# Patient Record
Sex: Male | Born: 1935 | Race: White | Hispanic: No | Marital: Married | Smoking: Never smoker
Health system: Southern US, Community
[De-identification: ages and names within clinical notes are randomized; demographics above are authoritative.]

## PROBLEM LIST (undated history)

## (undated) DIAGNOSIS — D509 Iron deficiency anemia, unspecified: Secondary | ICD-10-CM

## (undated) DIAGNOSIS — I480 Paroxysmal atrial fibrillation: Secondary | ICD-10-CM

## (undated) DIAGNOSIS — I499 Cardiac arrhythmia, unspecified: Secondary | ICD-10-CM

## (undated) DIAGNOSIS — C61 Malignant neoplasm of prostate: Secondary | ICD-10-CM

## (undated) DIAGNOSIS — Z972 Presence of dental prosthetic device (complete) (partial): Secondary | ICD-10-CM

## (undated) DIAGNOSIS — I10 Essential (primary) hypertension: Secondary | ICD-10-CM

## (undated) DIAGNOSIS — R29898 Other symptoms and signs involving the musculoskeletal system: Secondary | ICD-10-CM

## (undated) DIAGNOSIS — Z974 Presence of external hearing-aid: Secondary | ICD-10-CM

## (undated) DIAGNOSIS — I951 Orthostatic hypotension: Secondary | ICD-10-CM

## (undated) DIAGNOSIS — E042 Nontoxic multinodular goiter: Secondary | ICD-10-CM

## (undated) DIAGNOSIS — I77810 Thoracic aortic ectasia: Secondary | ICD-10-CM

## (undated) DIAGNOSIS — I4891 Unspecified atrial fibrillation: Secondary | ICD-10-CM

## (undated) DIAGNOSIS — F32A Depression, unspecified: Secondary | ICD-10-CM

## (undated) DIAGNOSIS — I422 Other hypertrophic cardiomyopathy: Secondary | ICD-10-CM

## (undated) DIAGNOSIS — D649 Anemia, unspecified: Secondary | ICD-10-CM

## (undated) DIAGNOSIS — K219 Gastro-esophageal reflux disease without esophagitis: Secondary | ICD-10-CM

## (undated) DIAGNOSIS — K4090 Unilateral inguinal hernia, without obstruction or gangrene, not specified as recurrent: Secondary | ICD-10-CM

## (undated) DIAGNOSIS — Z79899 Other long term (current) drug therapy: Secondary | ICD-10-CM

## (undated) DIAGNOSIS — I471 Supraventricular tachycardia, unspecified: Secondary | ICD-10-CM

## (undated) DIAGNOSIS — H409 Unspecified glaucoma: Secondary | ICD-10-CM

## (undated) DIAGNOSIS — R0600 Dyspnea, unspecified: Secondary | ICD-10-CM

## (undated) DIAGNOSIS — I639 Cerebral infarction, unspecified: Secondary | ICD-10-CM

## (undated) DIAGNOSIS — I517 Cardiomegaly: Secondary | ICD-10-CM

## (undated) DIAGNOSIS — I5189 Other ill-defined heart diseases: Secondary | ICD-10-CM

## (undated) DIAGNOSIS — N400 Enlarged prostate without lower urinary tract symptoms: Secondary | ICD-10-CM

## (undated) DIAGNOSIS — I2510 Atherosclerotic heart disease of native coronary artery without angina pectoris: Secondary | ICD-10-CM

## (undated) DIAGNOSIS — C4490 Unspecified malignant neoplasm of skin, unspecified: Secondary | ICD-10-CM

## (undated) DIAGNOSIS — Z7901 Long term (current) use of anticoagulants: Secondary | ICD-10-CM

## (undated) DIAGNOSIS — R0789 Other chest pain: Secondary | ICD-10-CM

## (undated) DIAGNOSIS — K8020 Calculus of gallbladder without cholecystitis without obstruction: Secondary | ICD-10-CM

## (undated) DIAGNOSIS — E785 Hyperlipidemia, unspecified: Secondary | ICD-10-CM

## (undated) DIAGNOSIS — C439 Malignant melanoma of skin, unspecified: Secondary | ICD-10-CM

## (undated) DIAGNOSIS — E782 Mixed hyperlipidemia: Secondary | ICD-10-CM

## (undated) DIAGNOSIS — I2699 Other pulmonary embolism without acute cor pulmonale: Secondary | ICD-10-CM

## (undated) DIAGNOSIS — I700 Atherosclerosis of aorta: Secondary | ICD-10-CM

## (undated) DIAGNOSIS — I6789 Other cerebrovascular disease: Secondary | ICD-10-CM

## (undated) DIAGNOSIS — N183 Chronic kidney disease, stage 3 unspecified: Secondary | ICD-10-CM

## (undated) DIAGNOSIS — C801 Malignant (primary) neoplasm, unspecified: Secondary | ICD-10-CM

## (undated) DIAGNOSIS — K635 Polyp of colon: Secondary | ICD-10-CM

## (undated) DIAGNOSIS — Z955 Presence of coronary angioplasty implant and graft: Secondary | ICD-10-CM

## (undated) DIAGNOSIS — I219 Acute myocardial infarction, unspecified: Secondary | ICD-10-CM

## (undated) HISTORY — DX: Malignant melanoma of skin, unspecified: C43.9

## (undated) HISTORY — DX: Atherosclerotic heart disease of native coronary artery without angina pectoris: I25.10

## (undated) HISTORY — DX: Hyperlipidemia, unspecified: E78.5

## (undated) HISTORY — DX: Malignant (primary) neoplasm, unspecified: C80.1

## (undated) HISTORY — DX: Orthostatic hypotension: I95.1

## (undated) HISTORY — DX: Essential (primary) hypertension: I10

## (undated) HISTORY — PX: LUMBAR SPINE SURGERY: SHX701

## (undated) HISTORY — PX: OTHER SURGICAL HISTORY: SHX169

## (undated) HISTORY — DX: Calculus of gallbladder without cholecystitis without obstruction: K80.20

## (undated) HISTORY — DX: Unspecified malignant neoplasm of skin, unspecified: C44.90

## (undated) HISTORY — DX: Polyp of colon: K63.5

## (undated) HISTORY — DX: Acute myocardial infarction, unspecified: I21.9

---

## 2003-08-20 HISTORY — PX: CORONARY ANGIOPLASTY WITH STENT PLACEMENT: SHX49

## 2004-08-19 DIAGNOSIS — I2699 Other pulmonary embolism without acute cor pulmonale: Secondary | ICD-10-CM

## 2004-08-19 HISTORY — DX: Other pulmonary embolism without acute cor pulmonale: I26.99

## 2007-01-22 LAB — HM COLONOSCOPY: HM Colonoscopy: NORMAL

## 2007-08-20 HISTORY — PX: LEFT HEART CATH AND CORONARY ANGIOGRAPHY: CATH118249

## 2007-08-20 HISTORY — PX: CORONARY ANGIOPLASTY: SHX604

## 2008-11-24 ENCOUNTER — Ambulatory Visit: Payer: Self-pay | Admitting: Cardiology

## 2008-11-29 ENCOUNTER — Ambulatory Visit: Payer: Self-pay | Admitting: Cardiology

## 2008-11-29 ENCOUNTER — Encounter: Payer: Self-pay | Admitting: Cardiology

## 2008-11-29 LAB — CONVERTED CEMR LAB
ALT: 12 units/L (ref 0–53)
AST: 17 units/L (ref 0–37)
Albumin: 4.1 g/dL (ref 3.5–5.2)
Alkaline Phosphatase: 24 units/L — ABNORMAL LOW (ref 39–117)
Bilirubin, Direct: 0.2 mg/dL (ref 0.0–0.3)
Cholesterol: 111 mg/dL (ref 0–200)
HDL: 32 mg/dL — ABNORMAL LOW (ref >39)
Indirect Bilirubin: 0.5 mg/dL (ref 0.0–0.9)
LDL Cholesterol: 69 mg/dL (ref 0–99)
Total Bilirubin: 0.7 mg/dL (ref 0.3–1.2)
Total CHOL/HDL Ratio: 3.5
Total Protein: 6.9 g/dL (ref 6.0–8.3)
Triglycerides: 49 mg/dL (ref <150)
VLDL: 10 mg/dL (ref 0–40)

## 2008-12-20 ENCOUNTER — Encounter: Payer: Self-pay | Admitting: Cardiology

## 2008-12-20 ENCOUNTER — Ambulatory Visit: Payer: Self-pay

## 2009-04-26 ENCOUNTER — Ambulatory Visit: Payer: Medicare Other | Admitting: Internal Medicine

## 2009-05-19 ENCOUNTER — Ambulatory Visit: Payer: Self-pay | Admitting: Cardiology

## 2009-05-19 DIAGNOSIS — I25118 Atherosclerotic heart disease of native coronary artery with other forms of angina pectoris: Secondary | ICD-10-CM | POA: Insufficient documentation

## 2009-05-19 DIAGNOSIS — E782 Mixed hyperlipidemia: Secondary | ICD-10-CM | POA: Insufficient documentation

## 2009-05-19 DIAGNOSIS — I10 Essential (primary) hypertension: Secondary | ICD-10-CM | POA: Insufficient documentation

## 2010-04-26 ENCOUNTER — Inpatient Hospital Stay: Payer: Medicare Other | Admitting: Internal Medicine

## 2010-04-26 ENCOUNTER — Ambulatory Visit: Payer: Self-pay | Admitting: Cardiovascular Disease

## 2010-04-30 ENCOUNTER — Inpatient Hospital Stay: Payer: Medicare Other | Admitting: Internal Medicine

## 2010-05-08 ENCOUNTER — Encounter: Payer: Self-pay | Admitting: Cardiovascular Disease

## 2010-05-08 ENCOUNTER — Ambulatory Visit: Payer: Self-pay

## 2010-05-21 ENCOUNTER — Encounter: Payer: Self-pay | Admitting: Cardiovascular Disease

## 2010-08-08 ENCOUNTER — Ambulatory Visit: Payer: Medicare Other | Admitting: Urology

## 2010-09-06 ENCOUNTER — Ambulatory Visit
Admission: RE | Admit: 2010-09-06 | Discharge: 2010-09-06 | Payer: Self-pay | Source: Home / Self Care | Attending: Cardiovascular Disease | Admitting: Cardiovascular Disease

## 2010-09-06 ENCOUNTER — Encounter: Payer: Self-pay | Admitting: Cardiovascular Disease

## 2010-09-06 DIAGNOSIS — I480 Paroxysmal atrial fibrillation: Secondary | ICD-10-CM | POA: Insufficient documentation

## 2010-09-18 NOTE — Letter (Signed)
Summary: [place] [employer], agricultural at [address]   [place], [place] [address]   Phone: [phone]  Fax: [phone]      May 21, 2010 MRN: [id number]   [facility] [address] [place], [place]  [address]   Dear Mr. [facility],  Your test ordered by Selena Batten has been reviewed by your physician (or physician assistant) and was found to be normal or stable. Your physician (or physician assistant) felt no changes were needed at this time.  ___x_ Echocardiogram  ____ Cardiac Stress Test  ____ Lab Work  ____ Peripheral vascular study of arms, legs or neck  ____ CT scan or X-ray  ____ Lung or Breathing test  ____ Other:   Thank you.   Benedict Needy, RN    Dossie Arbour, MD

## 2010-09-20 NOTE — Assessment & Plan Note (Signed)
Summary: ROV/SAB   Visit Type:  Initial Consult Primary Provider:  Ronna Polio, MD   History of Present Illness: 77 yo with history of CAD s/p PCI in 2005, repeat cardiac catheterization in 2009 showing patent stent and no other significant CAD, normal systolic function in May 2010 who presented to the emergency room April 26 2010 with malaise, atrial fibrillation with RVR, urinary tract infection diagnosed on September 12 after continued malaise, who returns for cardiology followup.    he reports that he walks 5-6 times per week. He is active with no complaints. Shortly after his hospital discharge, he converted to normal sinus rhythm. He does have trace edema in his lower extremities. He denies any tachycardia palpitations, near syncope, lightheadedness. He has good exercise tolerance.  He has been on pravastatin with no myalgias (had trouble with Lipitor and Crestor).    Labs (9/10): creatinine 1.49, LDL 69, HDL 30  ECG showing normal sinus rhythm with rate of 55 beats per minute, no significant ST or T wave changes  Current Medications (verified): 1)  Pravastatin Sodium 40 Mg Tabs (Pravastatin Sodium) .... Take One Tablet By Mouth Daily At Bedtime 2)  Metoprolol Tartrate 50 Mg Tabs (Metoprolol Tartrate) .... Take 1/2 Tablet By Mouth Twice A Day 3)  Cyproheptadine Hcl 4 Mg Tabs (Cyproheptadine Hcl) .... 3 By Mouth Daily As Needed 4)  Antara 130 Mg Caps (Fenofibrate Micronized) .Marland Kitchen.. 1 By Mouth Daily 5)  Levobunolol Hcl 0.5 % Soln (Levobunolol Hcl) .Marland Kitchen.. 1 Drop Twice Daily Right Eye 6)  Fish Oil 1000 Mg Caps (Omega-3 Fatty Acids) .Marland Kitchen.. 1 By Mouth Two Times A Day 7)  Aspirin 81 Mg Tbec (Aspirin) .... Take One Tablet By Mouth Daily 8)  Diltiazem Hcl Cr 240 Mg Xr24h-Cap (Diltiazem Hcl) .... One Tablet Once Daily 9)  Nexium 40 Mg Cpdr (Esomeprazole Magnesium) .... One Tablet Once Daily 10)  Pradaxa 150 Mg Caps (Dabigatran Etexilate Mesylate) .... One Tablet Two Times A Day 11)   Finasteride 5 Mg Tabs (Finasteride) .... One Tablet Once Daily 12)  Probiotic  Caps (Probiotic Product) .... One Tablet Once Daily  Allergies (verified): No Known Drug Allergies  Review of Systems  The patient denies fever, weight loss, weight gain, vision loss, decreased hearing, hoarseness, chest pain, syncope, dyspnea on exertion, peripheral edema, prolonged cough, abdominal pain, incontinence, muscle weakness, depression, and enlarged lymph nodes.    Vital Signs:  Patient profile:   77 year old male Height:      76 inches Weight:      226.50 pounds BMI:     27.67 BP sitting:   152 / 89  (left arm) Cuff size:   regular  Vitals Entered By: Lysbeth Galas CMA (September 06, 2010 10:47 AM)  Physical Exam  General:  Well developed, well nourished, in no acute distress. Head:  normocephalic and atraumatic Neck:  Neck supple, no JVD. No masses, thyromegaly or abnormal cervical nodes. Lungs:  Clear bilaterally to auscultation and percussion. Heart:  Non-displaced PMI, chest non-tender; regular rate and rhythm, S1, S2 without murmurs, rubs or gallops. Carotid upstroke normal, no bruit.  Pedals normal pulses. No edema, no varicosities. Abdomen:  Bowel sounds positive; abdomen soft and non-tender without masses Msk:  Back normal, normal gait. Muscle strength and tone normal. Pulses:  pulses normal in all 4 extremities Extremities:  No clubbing or cyanosis. Neurologic:  Alert and oriented x 3. Skin:  Intact without lesions or rashes. Psych:  Normal affect.   Impression & Recommendations:  Problem # 1:  CAD, NATIVE VESSEL (ICD-414.01) no symptoms of angina at this time. Continue aggressive medical management. No further testing.  His updated medication list for this problem includes:    Metoprolol Tartrate 50 Mg Tabs (Metoprolol tartrate) .Marland Kitchen... Take 1/2 tablet by mouth twice a day    Aspirin 81 Mg Tbec (Aspirin) .Marland Kitchen... Take two tablets by mouth daily    Diltiazem Hcl Cr 240 Mg  Xr24h-cap (Diltiazem hcl) ..... One tablet once daily  Problem # 2:  HYPERLIPIDEMIA-MIXED (ICD-272.4) Cholesterol has been at goal. We will continue him on pravastatin.  His updated medication list for this problem includes:    Pravastatin Sodium 40 Mg Tabs (Pravastatin sodium) .Marland Kitchen... Take one tablet by mouth daily at bedtime    Antara 130 Mg Caps (Fenofibrate micronized) .Marland Kitchen... 1 by mouth daily  Problem # 3:  HYPERTENSION, BENIGN (ICD-401.1) Blood pressure is well-controlled on his current medication regimen. No further changes made. We've asked him to contact us if his heart rate is too low and we would decrease his diltiazem.  The following medications were removed from the medication list:    Micardis Hct 80-25 Mg Tabs (Telmisartan-hctz) .Marland Kitchen... 1 by mouth once daily His updated medication list for this problem includes:    Metoprolol Tartrate 50 Mg Tabs (Metoprolol tartrate) .Marland Kitchen... Take 1/2 tablet by mouth twice a day    Aspirin 81 Mg Tbec (Aspirin) .Marland Kitchen... Take two tablets by mouth daily    Diltiazem Hcl Cr 240 Mg Xr24h-cap (Diltiazem hcl) ..... One tablet once daily  Problem # 4:  ATRIAL FIBRILLATION (ICD-427.31) Episode of atrial fibrillation in the setting of urinary tract infection in September. He is maintaining normal sinus rhythm. We will hold his pradaxa and continue him on metoprolol and diltiazem for rhythm control. I suggested we perform periodic EKGs with Dr. Dan Humphreys and myself every few months. I also suggested he monitor his pulse at home to look for an irregular rhythm.  His updated medication list for this problem includes:    Metoprolol Tartrate 50 Mg Tabs (Metoprolol tartrate) .Marland Kitchen... Take 1/2 tablet by mouth twice a day    Aspirin 81 Mg Tbec (Aspirin) .Marland Kitchen... Take two tablets by mouth daily  Patient Instructions: 1)  Your physician recommends that you schedule a follow-up appointment in: 6 months 2)  Your physician has recommended you make the following change in your  medication: HOLD Pradaxa. INCREASE Aspirin 81mg  2 tablets once daily.

## 2010-09-26 NOTE — Consult Note (Signed)
Summary[facility]ist, [facility]   [facility]   Imported By: Roderic Ovens 09/21/2010 14:45:37  _____________________________________________________________________  External Attachment:    Type:   Image     Comment:   External Document

## 2010-11-13 ENCOUNTER — Encounter: Payer: Self-pay | Admitting: Cardiovascular Disease

## 2010-11-14 ENCOUNTER — Observation Stay: Payer: Medicare Other | Admitting: Internal Medicine

## 2010-11-14 DIAGNOSIS — R079 Chest pain, unspecified: Secondary | ICD-10-CM

## 2010-11-20 ENCOUNTER — Other Ambulatory Visit: Payer: Self-pay | Admitting: Cardiovascular Disease

## 2010-11-20 MED ORDER — NITROGLYCERIN 0.4 MG SL SUBL: 0.4000 mg | SUBLINGUAL_TABLET | SUBLINGUAL | Status: DC | PRN

## 2010-12-13 ENCOUNTER — Encounter: Payer: Self-pay | Admitting: Cardiovascular Disease

## 2010-12-13 ENCOUNTER — Ambulatory Visit (INDEPENDENT_AMBULATORY_CARE_PROVIDER_SITE_OTHER): Payer: Medicare Other | Admitting: Cardiovascular Disease

## 2010-12-13 DIAGNOSIS — I10 Essential (primary) hypertension: Secondary | ICD-10-CM

## 2010-12-13 DIAGNOSIS — E785 Hyperlipidemia, unspecified: Secondary | ICD-10-CM

## 2010-12-13 DIAGNOSIS — R0602 Shortness of breath: Secondary | ICD-10-CM

## 2010-12-13 DIAGNOSIS — I4891 Unspecified atrial fibrillation: Secondary | ICD-10-CM

## 2010-12-13 DIAGNOSIS — I2510 Atherosclerotic heart disease of native coronary artery without angina pectoris: Secondary | ICD-10-CM

## 2010-12-13 NOTE — Assessment & Plan Note (Addendum)
No changes to the medications were made. He has been unable to tolerate Lipitor and Crestor.

## 2010-12-13 NOTE — Patient Instructions (Signed)
You are doing well. No medication changes were made. We will check you blood today and send a report in the mail.  Please call us if you have new issues that need to be addressed before your next appt.  We will call you for a follow up Appt. In 6 months

## 2010-12-13 NOTE — Assessment & Plan Note (Signed)
Currently with no symptoms of angina. No further workup at this time. Continue current medication regimen. If his shortness of breath gets worse, we could perform a stress test and echocardiogram.

## 2010-12-13 NOTE — Progress Notes (Signed)
   Patient ID: William Taylor, male    DOB: [birth year], 77 y.o.   MRN: [id number]  HPI Comments: William Taylor is a very pleasant 77 year old gentleman with a history of coronary artery disease, stent in 2005, repeat catheterization in 2009 showing patent stent with no other significant disease, normal systolic function in May 2000 and and September 2011 who presented to the emergency room September 2001 with malaise, atrial fibrillation with RVR in the setting of a urinary tract infection, recent admission to the hospital March 28 for atrial fibrillation that developed after pushing mowing with some chest pain. He converted back to normal sinus rhythm and had no further symptoms. He was discharged without additional workup at the time.  He presents today and reports that he has not had any further atrial fibrillation that he can tell. He does have some shortness of breath with exertion such as tying his shoes, walking. He has not been doing his regular exercise has been working in the garden. He has been taking his medications without any significant side effects. Otherwise he denies any other new symptoms.  EKG shows normal sinus rhythm with rate 63 beats per minute with no significant ST or T-wave changes.     Review of Systems  Constitutional: Positive for fatigue.  HENT: Negative.   Eyes: Negative.   Respiratory: Positive for shortness of breath.   Cardiovascular: Negative.   Gastrointestinal: Negative.   Musculoskeletal: Negative.   Skin: Negative.   Neurological: Negative.   Hematological: Negative.   Psychiatric/Behavioral: Negative.   All other systems reviewed and are negative.   BP 112/68  Pulse 63  Ht 6\' 4"  (1.93 m)  Wt 225 lb 6.4 oz (102.241 kg)  BMI 27.44 kg/m2   Physical Exam  Nursing note and vitals reviewed. Constitutional: He is oriented to person, place, and time. He appears well-developed and well-nourished.  HENT:  Head: Normocephalic.  Nose: Nose normal.    Mouth/Throat: Oropharynx is clear and moist.  Eyes: Conjunctivae are normal. Pupils are equal, round, and reactive to light.  Neck: Normal range of motion. Neck supple. No JVD present.  Cardiovascular: Normal rate, regular rhythm, S1 normal, S2 normal, normal heart sounds and intact distal pulses.  Exam reveals no gallop and no friction rub.   No murmur heard. Pulmonary/Chest: Effort normal and breath sounds normal. No respiratory distress. He has no wheezes. He has no rales. He exhibits no tenderness.  Abdominal: Soft. Bowel sounds are normal. He exhibits no distension. There is no tenderness.  Musculoskeletal: Normal range of motion. He exhibits edema. He exhibits no tenderness.       Edema is trace  Lymphadenopathy:    He has no cervical adenopathy.  Neurological: He is alert and oriented to person, place, and time. Coordination normal.  Skin: Skin is warm and dry. No rash noted. No erythema.  Psychiatric: He has a normal mood and affect. His behavior is normal. Judgment and thought content normal.           Assessment and Plan

## 2010-12-13 NOTE — Assessment & Plan Note (Signed)
Blood pressure is well controlled on today's visit. No changes made to the medications. 

## 2010-12-13 NOTE — Assessment & Plan Note (Signed)
No further episodes of atrial fibrillation. We will continue him on his current medication regimen

## 2010-12-13 NOTE — Assessment & Plan Note (Signed)
He does have shortness of breath since his recent hospital admission. It is uncertain if this is secondary to cardiac etiology or deconditioning. We will check a basic metabolic panel and BNP today. If his BNP is elevated, we could try low-dose diuretic. We have encouraged him to increase his exercise.

## 2010-12-14 ENCOUNTER — Encounter: Payer: Self-pay | Admitting: Cardiovascular Disease

## 2010-12-14 LAB — BASIC METABOLIC PANEL
BUN: 22 mg/dL (ref 6–23)
CO2: 19 mEq/L (ref 19–32)
Chloride: 106 mEq/L (ref 96–112)
Creat: 1.22 mg/dL (ref 0.40–1.50)

## 2010-12-20 ENCOUNTER — Telehealth: Payer: Self-pay | Admitting: Cardiovascular Disease

## 2010-12-20 NOTE — Telephone Encounter (Signed)
Spoke to pt, he does confirm that his SOB has incr, he has no pulmonary history and does not use any inhalers, O2 etc. He first had SOB after discharged from hospital March of this year. Pt's last BNP was 24, he only has mild swelling of one foot, but he states this has been that way for years and it is unchanged, he denies any other swelling. Pt states SOB is rarely at rest and usually after minimal exertion. Told pt will talk to Dr. Mariah Milling and return his call, pt will call back in meantime if symptoms worsen.

## 2010-12-20 NOTE — Telephone Encounter (Signed)
Patient received his labs in the mail which stated everything was normal.  He is still very short of breath after minimal activity and wants to know what else can be done to help with this shortness of breath.

## 2010-12-21 NOTE — Telephone Encounter (Signed)
Spoke to pt, he does state he has been "walking" and SOB has worsened. Notified pt of msg below per Dr. Mariah Milling. Pt does not know what he wants to do, he is going to monitor symptoms over the weekend and will go to ER if unstable, otherwise, pt will f/u Monday with symptoms. I will check recent bloodwork done at [facility], if pt anemic this could be cause, if not, will order exercise myoview at [facility]. Pt ok with this.

## 2010-12-21 NOTE — Telephone Encounter (Signed)
It is either worsening coronary disease, Or underlying lung issue or deconditioning. If he wants, we can order a stress test (can do cardiac cath) We can set up PFTs. Or he can starting walking on regular basis and see if this helps over a few weeks.

## 2010-12-23 ENCOUNTER — Telehealth: Payer: Self-pay | Admitting: Physician Assistant

## 2010-12-23 ENCOUNTER — Inpatient Hospital Stay: Payer: Medicare Other | Admitting: Internal Medicine

## 2010-12-23 DIAGNOSIS — R079 Chest pain, unspecified: Secondary | ICD-10-CM

## 2010-12-23 NOTE — Telephone Encounter (Signed)
Returned call from patient concerning chest pain.  Pt states he was playing cards last night and began to have lt shoulder pain.  He then began to notice chest pain, pain was relieved with 2 SL nitro at 3 am.  He awoke this morning with chest discomfort.  He has assoc. SOB.  He has spoken with Dr. Mariah Milling concerning his SOB over the last couple of weeks and it appears they were going to proceed with myoview.  With the patient still complaining of chest tightness and SOB I have asked him to go to the ER for EKG and further evaluation.  Pt states understanding and appreciated the call back.

## 2010-12-23 NOTE — Telephone Encounter (Signed)
Can we call him first thing on Monday. Maybe we should set up a cardiac cath instead of stress test given chest pain.

## 2010-12-24 ENCOUNTER — Encounter: Payer: Self-pay | Admitting: Cardiovascular Disease

## 2010-12-24 ENCOUNTER — Observation Stay (HOSPITAL_COMMUNITY)
Admission: AD | Admit: 2010-12-24 | Discharge: 2010-12-26 | Disposition: A | Discharge status: Home / Self Care | Payer: Medicare Other | Source: Other Acute Inpatient Hospital | Attending: Cardiology | Admitting: Cardiology

## 2010-12-24 DIAGNOSIS — H409 Unspecified glaucoma: Secondary | ICD-10-CM | POA: Insufficient documentation

## 2010-12-24 DIAGNOSIS — I200 Unstable angina: Secondary | ICD-10-CM | POA: Insufficient documentation

## 2010-12-24 DIAGNOSIS — I2510 Atherosclerotic heart disease of native coronary artery without angina pectoris: Principal | ICD-10-CM | POA: Insufficient documentation

## 2010-12-24 DIAGNOSIS — Z86711 Personal history of pulmonary embolism: Secondary | ICD-10-CM | POA: Insufficient documentation

## 2010-12-24 DIAGNOSIS — E785 Hyperlipidemia, unspecified: Secondary | ICD-10-CM | POA: Insufficient documentation

## 2010-12-24 DIAGNOSIS — Z79899 Other long term (current) drug therapy: Secondary | ICD-10-CM | POA: Insufficient documentation

## 2010-12-24 DIAGNOSIS — N183 Chronic kidney disease, stage 3 unspecified: Secondary | ICD-10-CM | POA: Insufficient documentation

## 2010-12-24 DIAGNOSIS — I129 Hypertensive chronic kidney disease with stage 1 through stage 4 chronic kidney disease, or unspecified chronic kidney disease: Secondary | ICD-10-CM | POA: Insufficient documentation

## 2010-12-24 DIAGNOSIS — I4891 Unspecified atrial fibrillation: Secondary | ICD-10-CM | POA: Insufficient documentation

## 2010-12-24 DIAGNOSIS — I214 Non-ST elevation (NSTEMI) myocardial infarction: Secondary | ICD-10-CM

## 2010-12-24 HISTORY — DX: Non-ST elevation (NSTEMI) myocardial infarction: I21.4

## 2010-12-24 HISTORY — PX: LEFT HEART CATH AND CORONARY ANGIOGRAPHY: CATH118249

## 2010-12-24 NOTE — Telephone Encounter (Signed)
Pt at hospital today, scheduled for cath, this occurred prior to Monday morning.

## 2010-12-25 DIAGNOSIS — I2510 Atherosclerotic heart disease of native coronary artery without angina pectoris: Secondary | ICD-10-CM

## 2010-12-25 LAB — CBC
HCT: 40.1 % (ref 39.0–52.0)
MCH: 31.6 pg (ref 26.0–34.0)
MCHC: 33.9 g/dL (ref 30.0–36.0)
RDW: 12.9 % (ref 11.5–15.5)

## 2010-12-25 LAB — POCT ACTIVATED CLOTTING TIME: Activated Clotting Time: 358 seconds

## 2010-12-25 LAB — BASIC METABOLIC PANEL
Calcium: 9.7 mg/dL (ref 8.4–10.5)
Creatinine, Ser: 1.27 mg/dL (ref 0.4–1.5)
GFR calc non Af Amer: 55 mL/min — ABNORMAL LOW (ref >60)
Glucose, Bld: 98 mg/dL (ref 70–99)
Sodium: 138 mEq/L (ref 135–145)

## 2010-12-25 LAB — LIPID PANEL
Triglycerides: 99 mg/dL (ref <150)
VLDL: 20 mg/dL (ref 0–40)

## 2010-12-26 HISTORY — PX: CORONARY ANGIOPLASTY WITH STENT PLACEMENT: SHX49

## 2010-12-26 LAB — BASIC METABOLIC PANEL
CO2: 25 mEq/L (ref 19–32)
Chloride: 103 mEq/L (ref 96–112)
GFR calc Af Amer: >60 mL/min (ref >60)
Glucose, Bld: 114 mg/dL — ABNORMAL HIGH (ref 70–99)
Potassium: 3.9 mEq/L (ref 3.5–5.1)
Sodium: 138 mEq/L (ref 135–145)

## 2010-12-26 LAB — CBC
HCT: 38.8 % — ABNORMAL LOW (ref 39.0–52.0)
Hemoglobin: 12.8 g/dL — ABNORMAL LOW (ref 13.0–17.0)
MCHC: 33 g/dL (ref 30.0–36.0)
RBC: 4.17 MIL/uL — ABNORMAL LOW (ref 4.22–5.81)
WBC: 9.9 10*3/uL (ref 4.0–10.5)

## 2010-12-27 ENCOUNTER — Ambulatory Visit (INDEPENDENT_AMBULATORY_CARE_PROVIDER_SITE_OTHER): Payer: Medicare Other | Admitting: *Deleted

## 2010-12-27 ENCOUNTER — Encounter: Payer: Self-pay | Admitting: *Deleted

## 2010-12-27 ENCOUNTER — Telehealth: Payer: Self-pay | Admitting: *Deleted

## 2010-12-27 ENCOUNTER — Telehealth: Payer: Self-pay

## 2010-12-27 DIAGNOSIS — R0602 Shortness of breath: Secondary | ICD-10-CM

## 2010-12-27 DIAGNOSIS — R0902 Hypoxemia: Secondary | ICD-10-CM

## 2010-12-27 NOTE — Patient Instructions (Signed)
Your physician recommends you begin using oxygen @ 2 liters/min. You are scheduled for PFT (breathing testing) at [facility] 01/04/11 @ 1:00pm You are scheduled for a pulmonary consult at [facility] 01/09/11 @ 2:45pm, please arrive at 2:30pm You have been given a prescription for oxygen, please take this downstairs to [facility].

## 2010-12-27 NOTE — Telephone Encounter (Signed)
O2 sats low with exertion in hospital, no cause found.  Had CT chest at [facility].  Can come in for evaluation and check O2 sat.  May need O2.

## 2010-12-27 NOTE — Telephone Encounter (Signed)
Just got out of [place] yesterday from having a stent placed; had a terrible cough yesterday but the cough is much better this am. Today having a lot of shortness of breath. Please advise what to do?

## 2010-12-27 NOTE — Progress Notes (Signed)
Pt in today for assessment of SOB, s/p cardiac stent yesterday 12/26/10 @ [facility]. Pt developed terrible cough yesterday and today 5/10 he c/o increasing SOB. O2 sats were low with exertion in hospital, no cause found. Had CT chest at [facility]. Pt was walked in office with pulse oximetry, baseline 94%, after exercise 87%. Pt did recover back to baseline prior to discharge. Pt was referred to [facility] for PFT's and consult. Pt was given Rx for oxygen 2L for exertion. Pt will call office with any other problems in the meantime.

## 2010-12-27 NOTE — Telephone Encounter (Signed)
Pt went to ED over weekend, pt had cath and stent placed at [facility] by Dr. Mariah Milling 12/26/10. See phone note and nurse visit from 12/27/10.

## 2010-12-27 NOTE — Telephone Encounter (Signed)
Notified patient.

## 2010-12-27 NOTE — Telephone Encounter (Signed)
Pt came in today, could not wait for f/u visit, with c/o severe cough yesterday and incr SOB today. Pt was walked in office with pulse oximetry, baseline @ 94%, after exertion, 87%. Pt did recover back to baseline prior to leaving. Dr. Shirlee Latch in office, oxygen 2 L ordered for at home for exertion. Pt was referred to [facility] per previous order, he will have PFT's 01/04/11 and will have consult with Dr. Delton Coombes 01/09/11. Dr. Mariah Milling, do you want pt in for f/u here prior to that or wait for pulmonary visit? Please advise.

## 2010-12-28 NOTE — Telephone Encounter (Signed)
He can see pulmonary first

## 2010-12-31 ENCOUNTER — Encounter: Payer: Self-pay | Admitting: *Deleted

## 2010-12-31 ENCOUNTER — Inpatient Hospital Stay: Payer: Medicare Other | Admitting: Internal Medicine

## 2010-12-31 ENCOUNTER — Encounter: Payer: Self-pay | Admitting: Cardiovascular Disease

## 2010-12-31 ENCOUNTER — Ambulatory Visit: Payer: Medicare Other | Admitting: Cardiovascular Disease

## 2010-12-31 ENCOUNTER — Telehealth: Payer: Self-pay | Admitting: *Deleted

## 2010-12-31 ENCOUNTER — Ambulatory Visit (INDEPENDENT_AMBULATORY_CARE_PROVIDER_SITE_OTHER): Payer: Medicare Other | Admitting: *Deleted

## 2010-12-31 DIAGNOSIS — Z86711 Personal history of pulmonary embolism: Secondary | ICD-10-CM

## 2010-12-31 DIAGNOSIS — R0602 Shortness of breath: Secondary | ICD-10-CM

## 2010-12-31 DIAGNOSIS — R079 Chest pain, unspecified: Secondary | ICD-10-CM

## 2010-12-31 MED ORDER — HYDROCODONE-ACETAMINOPHEN 5-500 MG PO TABS: 1.0000 | ORAL_TABLET | ORAL | Status: DC | PRN

## 2010-12-31 NOTE — Telephone Encounter (Signed)
Noted. Will follow up with pt after his appt with Pulmonary.

## 2010-12-31 NOTE — Cardiovascular Report (Signed)
NAMEGREGORI, William Taylor              ACCOUNT NO.:  [id number]  MEDICAL RECORD NO.:  [id number]           PATIENT TYPE:  I  LOCATION:  6525                         FACILITY:  [facility]  PHYSICIAN:  William Taylor, M.D.  DATE OF BIRTH:  12/05/1929  DATE OF PROCEDURE: DATE OF DISCHARGE:                           CARDIAC CATHETERIZATION   INDICATIONS FOR PROCEDURE:  The patient is a 77 year old white male who has had previous stenting of the left anterior descending artery who presented with a non-Q-wave myocardial infarction.  Diagnostic cardiac catheterization demonstrated 90% stenosis in the distal second obtuse marginal vessel.  The LAD stent was still patent.  On diagnostic angiography,  the right coronary artery was not visualized.  PROCEDURE:  Coronary angiography with intracoronary stenting of the distal second obtuse marginal vessel.  ACCESS:  Via the left femoral artery using standard Seldinger technique.  EQUIPMENT:  A left Amplatz 1 diagnostic catheter, a 6-French Voda 3.5 guide, a Prowater wire, a 2.5 x 12-mm apex balloon, a 2.25 x 16 mm PROMUS Element Plus stent and a 2.5 x 12-mm Hungry Horse Quantum apex balloon.  MEDICATIONS:  Local anesthesia with 1% Xylocaine, Versed 1 mg IV, fentanyl 25 mcg IV, Angiomax 0.75 mg/kg IV followed by infusion of 1.75 mg/kg/hour.  ACT at this point was 358 seconds.  The patient had previously been poorly loaded with Plavix 600 mg p.o., nitroglycerin 200 mcg intracoronary x2.  CONTRAST:  90 mL of Omnipaque.  ANGIOGRAPHIC DATA:  We initially performed angiography of the right coronary artery which had an anterior takeoff.  This was easily accessed with the left Amplatz 1 diagnostic catheter.  The right coronary artery had mild irregularities in the mid vessel less than 20%.  He had no significant obstructive disease.  We proceeded with intervention of the distal second obtuse marginal vessel.  The proximal circumflex was tortuous.  The second  obtuse marginal vessel had a 90% distal stenosis prior to bifurcation.  The more superior branch was somewhat diffusely diseased after the bifurcation.  The more lateral branch was tortuous but without significant disease.  We initially crossed with a wire without difficulty into the larger branch.  We predilated the lesion with a 2.25 mm balloon up to 6 atmospheres.  We then stented across the bifurcation with a 2.25 x 16 mm PROMUS Element Plus stent deploying this at 12 atmospheres.  We postdilated with a 2.5 x 12-mm Sigurd Quantum apex balloon up to 14 atmospheres in the more proximal portion of the stent.  This yielded an excellent angiographic result with 0% residual stenosis and TIMI grade 3 flow.  There was no compromise at the side branch of the bifurcation.  FINAL INTERPRETATION: 1. Minor nonobstructive disease in the native right coronary artery. 2. Successful stenting of the second obtuse marginal vessel with a     drug-eluting stent.  PLAN:  We will continue on aspirin and Plavix.          ______________________________Peter M. Taylor, M.D.     PMJ/MEDQ  D:  12/25/2010  T:  12/26/2010  Job:  [id number]  cc:   William Iba, MD  Electronically  Signed by William Taylor M.D. on 12/31/2010 09:39:23 AM

## 2010-12-31 NOTE — Telephone Encounter (Signed)
Pt called this AM c/o "sharp, constant pain below left shoulder blade, worsens with deep breath." Pt states he woke up with this pain this AM, no other symptoms. Pt is s/p stent, he has had SOB and SaO2 issues since his cath as well. Pt denies SOB this AM, he has been given home oxygen and states he has not had to use this AM. Pt has been trying to walk since stent, but unable to this AM. After talking to Dr. Mariah Milling, spoke with pt again about taking anti-inflammatory to see if this is musculoskeletal. Pt states he has taken 2 Tylenol, and is very persistent with finding out what is going on. Notified pt we would see him today for nurse visit, will do EKG and possible cardiac enzymes. Pt ok with this.

## 2010-12-31 NOTE — Progress Notes (Signed)
Pt in today (see phone note 12/31/10) for chest discomfort, worsens with breathing. The pain is "constant" and is below left shoulder blade and radiates to  EKG shows NSR. Pt is s/p stent, during cath pt had O2 desaturation issues and has had since discharge. Pt is now on oxygen 2L PRN at home for exertion. Pt states he had a PE in 2006 after ortho surgery. Pt states that the pain he is experiencing today is similar to when he had PE. Per Dr. Mariah Milling, we ordered chest CT with contrast today and cardiac enzymes and D-Dimer drawn STAT. We will obtain results today and contact pt.

## 2011-01-01 DIAGNOSIS — I2699 Other pulmonary embolism without acute cor pulmonale: Secondary | ICD-10-CM
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
## 2011-01-07 ENCOUNTER — Other Ambulatory Visit (INDEPENDENT_AMBULATORY_CARE_PROVIDER_SITE_OTHER): Payer: Medicare Other | Admitting: *Deleted

## 2011-01-07 DIAGNOSIS — I2699 Other pulmonary embolism without acute cor pulmonale: Secondary | ICD-10-CM

## 2011-01-07 NOTE — Progress Notes (Signed)
Pt in today for INR check, follow up from hospital. Pt is s/p cardiac cath, and he c/o swelling at groin site that began Saturday 01/05/11. Pt states upon discharge from hospital this was not present, and he believes site may have gotten larger slightly since Saturday. Upon assessment of left groin, hematoma noted about 1.5in in diameter, hard to the touch, pt does have bruising on left thigh which I notified pt was normal post cath. I have marked hematoma with a sharpie, and notified pt to monitor site and call office if becomes any larger. Pt ok with this. Dr. Mariah Milling aware. Pt has follow up this Wednesday 01/09/11, we will reassess site at that time.

## 2011-01-09 ENCOUNTER — Institutional Professional Consult (permissible substitution): Payer: Medicare Other | Admitting: Emergency Medicine

## 2011-01-09 ENCOUNTER — Encounter: Payer: Self-pay | Admitting: Cardiovascular Disease

## 2011-01-09 ENCOUNTER — Other Ambulatory Visit: Payer: Self-pay | Admitting: Cardiovascular Disease

## 2011-01-09 ENCOUNTER — Ambulatory Visit (INDEPENDENT_AMBULATORY_CARE_PROVIDER_SITE_OTHER): Payer: Medicare Other | Admitting: Cardiovascular Disease

## 2011-01-09 ENCOUNTER — Ambulatory Visit (INDEPENDENT_AMBULATORY_CARE_PROVIDER_SITE_OTHER): Payer: Medicare Other | Admitting: Emergency Medicine

## 2011-01-09 DIAGNOSIS — R0602 Shortness of breath: Secondary | ICD-10-CM

## 2011-01-09 DIAGNOSIS — Z86711 Personal history of pulmonary embolism: Secondary | ICD-10-CM | POA: Insufficient documentation

## 2011-01-09 DIAGNOSIS — E785 Hyperlipidemia, unspecified: Secondary | ICD-10-CM

## 2011-01-09 DIAGNOSIS — I2510 Atherosclerotic heart disease of native coronary artery without angina pectoris: Secondary | ICD-10-CM

## 2011-01-09 DIAGNOSIS — I4891 Unspecified atrial fibrillation: Secondary | ICD-10-CM

## 2011-01-09 DIAGNOSIS — I10 Essential (primary) hypertension: Secondary | ICD-10-CM

## 2011-01-09 DIAGNOSIS — I2699 Other pulmonary embolism without acute cor pulmonale: Secondary | ICD-10-CM

## 2011-01-09 DIAGNOSIS — Z7189 Other specified counseling: Secondary | ICD-10-CM | POA: Insufficient documentation

## 2011-01-09 DIAGNOSIS — Z7901 Long term (current) use of anticoagulants: Secondary | ICD-10-CM

## 2011-01-09 LAB — POCT INR: INR: 2.9

## 2011-01-09 MED ORDER — CLOPIDOGREL BISULFATE 75 MG PO TABS: 75.0000 mg | ORAL_TABLET | Freq: Every day | ORAL | Status: DC

## 2011-01-09 NOTE — Telephone Encounter (Signed)
Already called into pharmacy,duplicate

## 2011-01-09 NOTE — Assessment & Plan Note (Signed)
PE noticed following cardiac catheterization likely contributing to his shortness of breath. He can't he feels well on warfarin. INR today is 2.9. Warfarin has been adjusted to keep INR between 2 and 2.5.

## 2011-01-09 NOTE — Assessment & Plan Note (Signed)
Cholesterol was at goal. LDL was slightly above normal at 88. We have encouraged him to restart his walking and watch his diet. If repeat cholesterol numbers continue to show LDL greater than 70, we could change cholesterol medications or add zetia 10 mg daily

## 2011-01-09 NOTE — Assessment & Plan Note (Signed)
We have discussed the case with Dr. Peter [place] and will continue low-dose aspirin, Plavix and warfarin for at least 6 months, following which, we could discuss holding the Plavix if needed

## 2011-01-09 NOTE — Assessment & Plan Note (Signed)
Clinical exam today suggests normal sinus rhythm. We'll continue him on his current medications

## 2011-01-09 NOTE — Assessment & Plan Note (Signed)
Shortness of breath has improved on warfarin. He is returned his oxygen as he does not feel he needs it

## 2011-01-09 NOTE — Progress Notes (Signed)
   Patient ID: William Taylor, male    DOB: 26-Dec-1929, 77 y.o.   MRN: [id number]  HPI Comments: William Taylor is a very pleasant 77 year old gentleman with a history of coronary artery disease, stent in 2005, repeat catheterization in 2009 showing patent stent with no other significant disease, normal systolic function in May 2000 and and September 2011 who presented to the emergency room September 2001 with malaise, atrial fibrillation with RVR in the setting of a urinary tract infection, recent admission to the hospital March 28 for atrial fibrillation that developed after pushing mowing with some chest pain. He converted back to normal sinus rhythm, Presenting to [facility] with chest discomfort with cardiac catheterization Dec 25 2010, showing 90% OM 2 disease, transferred to [facility]  with DES stent placed, 2.25 x 16 mm POMUS stent, with flank pain developing after discharge, admitted to [facility] and diagnosed with pulmonary embolism with heavy clot burden. This was the cause of his shortness of breath that had been ongoing.  Today he presents for routine followup. He reports that he feels well with improved shortness of breath. He has not started his exercise program at cardiac rehabilitation. He is tolerating low-dose aspirin, Plavix, warfarin. INR today is 2.9 and adjustments have been made to keep his INR in the low to mid 2 range. No edema, no chest pain. Overall he has no complaints.  Old EKG shows normal sinus rhythm with rate 63 beats per minute with no significant ST or T-wave changes.     Review of Systems  Constitutional: Negative.   HENT: Negative.   Eyes: Negative.   Respiratory: Negative.   Cardiovascular: Negative.   Gastrointestinal: Negative.   Musculoskeletal: Negative.        Slight swelling in the left groin region at the site of the cardiac catheterization.  Skin: Negative.   Neurological: Negative.   Hematological: Negative.   Psychiatric/Behavioral: Negative.   All other  systems reviewed and are negative.    BP 106/68  Pulse 60  Ht 6\' 4"  (1.93 m)  Wt 219 lb (99.338 kg)  BMI 26.66 kg/m2   Physical Exam  Nursing note and vitals reviewed. Constitutional: He is oriented to person, place, and time. He appears well-developed and well-nourished.  HENT:  Head: Normocephalic.  Nose: Nose normal.  Mouth/Throat: Oropharynx is clear and moist.  Eyes: Conjunctivae are normal. Pupils are equal, round, and reactive to light.  Neck: Normal range of motion. Neck supple. No JVD present.  Cardiovascular: Normal rate, regular rhythm, S1 normal, S2 normal, normal heart sounds and intact distal pulses.  Exam reveals no gallop and no friction rub.   No murmur heard. Pulmonary/Chest: Effort normal and breath sounds normal. No respiratory distress. He has no wheezes. He has no rales. He exhibits no tenderness.  Abdominal: Soft. Bowel sounds are normal. He exhibits no distension. There is no tenderness.  Musculoskeletal: Normal range of motion. He exhibits no edema and no tenderness.  Lymphadenopathy:    He has no cervical adenopathy.  Neurological: He is alert and oriented to person, place, and time. Coordination normal.  Skin: Skin is warm and dry. No rash noted. No erythema.  Psychiatric: He has a normal mood and affect. His behavior is normal. Judgment and thought content normal.           Assessment and Plan

## 2011-01-09 NOTE — Patient Instructions (Signed)
You are doing well. No medication changes were made. Please call us if you have new issues that need to be addressed before your next appt.  We will call you for a follow up Appt. In 6 months  

## 2011-01-09 NOTE — Assessment & Plan Note (Signed)
Blood pressure is well controlled on today's visit. No changes made to the medications. 

## 2011-01-16 ENCOUNTER — Ambulatory Visit (INDEPENDENT_AMBULATORY_CARE_PROVIDER_SITE_OTHER): Payer: Medicare Other | Admitting: Emergency Medicine

## 2011-01-16 DIAGNOSIS — I2699 Other pulmonary embolism without acute cor pulmonale: Secondary | ICD-10-CM

## 2011-01-16 DIAGNOSIS — I4891 Unspecified atrial fibrillation: Secondary | ICD-10-CM

## 2011-01-16 DIAGNOSIS — Z7901 Long term (current) use of anticoagulants: Secondary | ICD-10-CM

## 2011-01-16 LAB — POCT INR: INR: 2.9

## 2011-01-23 ENCOUNTER — Ambulatory Visit (INDEPENDENT_AMBULATORY_CARE_PROVIDER_SITE_OTHER): Payer: Medicare Other | Admitting: Emergency Medicine

## 2011-01-23 DIAGNOSIS — I4891 Unspecified atrial fibrillation: Secondary | ICD-10-CM

## 2011-01-23 DIAGNOSIS — I2699 Other pulmonary embolism without acute cor pulmonale: Secondary | ICD-10-CM

## 2011-01-23 DIAGNOSIS — Z7901 Long term (current) use of anticoagulants: Secondary | ICD-10-CM

## 2011-01-23 LAB — POCT INR: INR: 2.6

## 2011-01-28 ENCOUNTER — Encounter: Payer: Medicare Other | Admitting: Cardiovascular Disease

## 2011-02-06 ENCOUNTER — Ambulatory Visit (INDEPENDENT_AMBULATORY_CARE_PROVIDER_SITE_OTHER): Payer: Medicare Other | Admitting: Emergency Medicine

## 2011-02-06 ENCOUNTER — Encounter: Payer: Medicare Other | Admitting: Emergency Medicine

## 2011-02-06 DIAGNOSIS — Z7901 Long term (current) use of anticoagulants: Secondary | ICD-10-CM

## 2011-02-06 DIAGNOSIS — I2699 Other pulmonary embolism without acute cor pulmonale: Secondary | ICD-10-CM

## 2011-02-06 DIAGNOSIS — I4891 Unspecified atrial fibrillation: Secondary | ICD-10-CM

## 2011-02-17 ENCOUNTER — Encounter: Payer: Medicare Other | Admitting: Cardiovascular Disease

## 2011-02-27 ENCOUNTER — Ambulatory Visit (INDEPENDENT_AMBULATORY_CARE_PROVIDER_SITE_OTHER): Payer: Medicare Other | Admitting: Emergency Medicine

## 2011-02-27 DIAGNOSIS — Z7901 Long term (current) use of anticoagulants: Secondary | ICD-10-CM

## 2011-02-27 DIAGNOSIS — I2699 Other pulmonary embolism without acute cor pulmonale: Secondary | ICD-10-CM

## 2011-02-27 DIAGNOSIS — I4891 Unspecified atrial fibrillation: Secondary | ICD-10-CM

## 2011-02-27 LAB — POCT INR: INR: 2.8

## 2011-03-08 ENCOUNTER — Other Ambulatory Visit: Payer: Self-pay | Admitting: Cardiology

## 2011-03-08 ENCOUNTER — Encounter (INDEPENDENT_AMBULATORY_CARE_PROVIDER_SITE_OTHER): Payer: Medicare Other | Admitting: *Deleted

## 2011-03-08 DIAGNOSIS — M79609 Pain in unspecified limb: Secondary | ICD-10-CM

## 2011-03-08 DIAGNOSIS — R229 Localized swelling, mass and lump, unspecified: Secondary | ICD-10-CM

## 2011-03-11 ENCOUNTER — Encounter: Payer: Self-pay | Admitting: Cardiovascular Disease

## 2011-03-20 ENCOUNTER — Ambulatory Visit (INDEPENDENT_AMBULATORY_CARE_PROVIDER_SITE_OTHER): Payer: Medicare Other | Admitting: Emergency Medicine

## 2011-03-20 ENCOUNTER — Encounter: Payer: Medicare Other | Admitting: Cardiovascular Disease

## 2011-03-20 DIAGNOSIS — I4891 Unspecified atrial fibrillation: Secondary | ICD-10-CM

## 2011-03-20 DIAGNOSIS — Z7901 Long term (current) use of anticoagulants: Secondary | ICD-10-CM

## 2011-03-20 DIAGNOSIS — I2699 Other pulmonary embolism without acute cor pulmonale: Secondary | ICD-10-CM

## 2011-03-20 LAB — POCT INR: INR: 2

## 2011-04-17 ENCOUNTER — Ambulatory Visit (INDEPENDENT_AMBULATORY_CARE_PROVIDER_SITE_OTHER): Payer: Medicare Other | Admitting: Emergency Medicine

## 2011-04-17 DIAGNOSIS — Z7901 Long term (current) use of anticoagulants: Secondary | ICD-10-CM

## 2011-04-17 DIAGNOSIS — I2699 Other pulmonary embolism without acute cor pulmonale: Secondary | ICD-10-CM

## 2011-04-17 DIAGNOSIS — I4891 Unspecified atrial fibrillation: Secondary | ICD-10-CM

## 2011-04-18 ENCOUNTER — Telehealth: Payer: Self-pay | Admitting: Internal Medicine

## 2011-04-18 NOTE — Telephone Encounter (Signed)
Fine for pt to get flu shot when available

## 2011-04-18 NOTE — Telephone Encounter (Signed)
Can patient get a flu shot.

## 2011-04-20 ENCOUNTER — Other Ambulatory Visit: Payer: Self-pay | Admitting: Internal Medicine

## 2011-05-05 ENCOUNTER — Other Ambulatory Visit: Payer: Self-pay | Admitting: Internal Medicine

## 2011-05-08 ENCOUNTER — Ambulatory Visit (INDEPENDENT_AMBULATORY_CARE_PROVIDER_SITE_OTHER): Payer: Medicare Other | Admitting: Emergency Medicine

## 2011-05-08 DIAGNOSIS — I4891 Unspecified atrial fibrillation: Secondary | ICD-10-CM

## 2011-05-08 DIAGNOSIS — Z7901 Long term (current) use of anticoagulants: Secondary | ICD-10-CM

## 2011-05-08 DIAGNOSIS — I2699 Other pulmonary embolism without acute cor pulmonale: Secondary | ICD-10-CM

## 2011-05-16 ENCOUNTER — Ambulatory Visit (INDEPENDENT_AMBULATORY_CARE_PROVIDER_SITE_OTHER): Payer: Medicare Other | Admitting: Internal Medicine

## 2011-05-16 DIAGNOSIS — Z23 Encounter for immunization: Secondary | ICD-10-CM

## 2011-05-22 ENCOUNTER — Ambulatory Visit: Payer: Medicare Other | Admitting: Ophthalmology

## 2011-05-22 HISTORY — PX: CATARACT EXTRACTION: SUR2

## 2011-05-29 ENCOUNTER — Ambulatory Visit (INDEPENDENT_AMBULATORY_CARE_PROVIDER_SITE_OTHER): Payer: Medicare Other | Admitting: Emergency Medicine

## 2011-05-29 DIAGNOSIS — I4891 Unspecified atrial fibrillation: Secondary | ICD-10-CM

## 2011-05-29 DIAGNOSIS — Z7901 Long term (current) use of anticoagulants: Secondary | ICD-10-CM

## 2011-05-29 DIAGNOSIS — I2699 Other pulmonary embolism without acute cor pulmonale: Secondary | ICD-10-CM

## 2011-05-29 LAB — POCT INR: INR: 2.3

## 2011-06-26 ENCOUNTER — Ambulatory Visit (INDEPENDENT_AMBULATORY_CARE_PROVIDER_SITE_OTHER): Payer: Medicare Other | Admitting: Emergency Medicine

## 2011-06-26 DIAGNOSIS — Z7901 Long term (current) use of anticoagulants: Secondary | ICD-10-CM

## 2011-06-26 DIAGNOSIS — I2699 Other pulmonary embolism without acute cor pulmonale: Secondary | ICD-10-CM

## 2011-06-26 DIAGNOSIS — I4891 Unspecified atrial fibrillation: Secondary | ICD-10-CM

## 2011-07-16 ENCOUNTER — Encounter: Payer: Self-pay | Admitting: Cardiovascular Disease

## 2011-07-16 ENCOUNTER — Ambulatory Visit (INDEPENDENT_AMBULATORY_CARE_PROVIDER_SITE_OTHER): Payer: Medicare Other | Admitting: Cardiovascular Disease

## 2011-07-16 DIAGNOSIS — I4891 Unspecified atrial fibrillation: Secondary | ICD-10-CM

## 2011-07-16 DIAGNOSIS — E785 Hyperlipidemia, unspecified: Secondary | ICD-10-CM

## 2011-07-16 DIAGNOSIS — I2699 Other pulmonary embolism without acute cor pulmonale: Secondary | ICD-10-CM

## 2011-07-16 DIAGNOSIS — I2510 Atherosclerotic heart disease of native coronary artery without angina pectoris: Secondary | ICD-10-CM

## 2011-07-16 NOTE — Progress Notes (Signed)
Patient ID: William Taylor, male    DOB: 1930-02-05, 77 y.o.   MRN: [id number]  HPI Comments: Mr. Paulsen is a very pleasant 77 year old gentleman with a history of coronary artery disease, stent in 2005, repeat catheterization in 2009 showing patent stent with no other significant disease, normal systolic function in September 2011 who presented to the emergency room September 2011 with malaise, atrial fibrillation with RVR in the setting of a urinary tract infection, recent admission to the hospital November 14 2010 for atrial fibrillation that developed after pushing mowing with some chest pain. He converted back to normal sinus rhythm, Presenting to [facility] with chest discomfort with cardiac catheterization Dec 25 2010, showing 90% OM 2 disease, transferred to [facility]  with DES stent placed, 2.25 x 16 mm POMUS stent, with flank pain developing after discharge, admitted to [facility] and diagnosed with pulmonary embolism with heavy clot burden. This was the cause of his shortness of breath that had been ongoing.  Today he presents for routine followup. He has had no episodes of bleeding and otherwise feels well. He has been participating in heart track and does go to the gym at least twice per week. No significant shortness of breath or chest discomfort with exertion   EKG shows normal sinus rhythm with rate 52 beats per minute with no significant ST or T-wave changes.   Outpatient Encounter Prescriptions as of 07/16/2011  Medication Sig Dispense Refill  . aspirin 81 MG EC tablet Take 81 mg by mouth daily.        . clopidogrel (PLAVIX) 75 MG tablet Take 1 tablet (75 mg total) by mouth daily.  90 tablet  3  . cyproheptadine (PERIACTIN) 4 MG tablet Take 4 mg by mouth 3 (three) times daily as needed.        . diltiazem (CARDIZEM CD) 240 MG 24 hr capsule Take 240 mg by mouth daily.        . fenofibrate micronized (ANTARA) 130 MG capsule Take 130 mg by mouth daily before breakfast.       . finasteride  (PROSCAR) 5 MG tablet Take 5 mg by mouth daily.        Marland Kitchen levobunolol (BETAGAN) 0.5 % ophthalmic solution 1 drop at bedtime.        . metoprolol (LOPRESSOR) 50 MG tablet TAKE ONE-HALF (1/2) TABLET TWICE A DAY  90 tablet  2  . NEXIUM 40 MG capsule TAKE 1 CAPSULE DAILY  90 capsule  3  . nitroGLYCERIN (NITROSTAT) 0.4 MG SL tablet Place 1 tablet (0.4 mg total) under the tongue every 5 (five) minutes as needed for chest pain.  25 tablet  3  . Omega-3 Fatty Acids (FISH OIL) 1000 MG CAPS Take 1,000 mg by mouth 1 dose over 46 hours.        . pravastatin (PRAVACHOL) 40 MG tablet Take 40 mg by mouth daily.        . Probiotic Product (PROBIOTIC FORMULA PO) Take by mouth.        . warfarin (COUMADIN) 10 MG tablet Take 10 mg by mouth daily.        Marland Kitchen DISCONTD: HYDROcodone-acetaminophen (VICODIN) 5-500 MG per tablet Take 1 tablet by mouth every 4 (four) hours as needed for pain.  20 tablet  0    Review of Systems  Constitutional: Negative.   HENT: Negative.   Eyes: Negative.   Respiratory: Negative.   Cardiovascular: Negative.   Gastrointestinal: Negative.   Musculoskeletal: Negative.  Skin: Negative.   Neurological: Negative.   Hematological: Negative.   Psychiatric/Behavioral: Negative.   All other systems reviewed and are negative.    BP 128/68  Pulse 52  Ht 6\' 4"  (1.93 m)  Wt 223 lb 8 oz (101.379 kg)  BMI 27.21 kg/m2   Physical Exam  Nursing note and vitals reviewed. Constitutional: He is oriented to person, place, and time. He appears well-developed and well-nourished.  HENT:  Head: Normocephalic.  Nose: Nose normal.  Mouth/Throat: Oropharynx is clear and moist.  Eyes: Conjunctivae are normal. Pupils are equal, round, and reactive to light.  Neck: Normal range of motion. Neck supple. No JVD present.  Cardiovascular: Normal rate, regular rhythm, S1 normal, S2 normal, normal heart sounds and intact distal pulses.  Exam reveals no gallop and no friction rub.   No murmur  heard. Pulmonary/Chest: Effort normal and breath sounds normal. No respiratory distress. He has no wheezes. He has no rales. He exhibits no tenderness.  Abdominal: Soft. Bowel sounds are normal. He exhibits no distension. There is no tenderness.  Musculoskeletal: Normal range of motion. He exhibits no edema and no tenderness.  Lymphadenopathy:    He has no cervical adenopathy.  Neurological: He is alert and oriented to person, place, and time. Coordination normal.  Skin: Skin is warm and dry. No rash noted. No erythema.  Psychiatric: He has a normal mood and affect. His behavior is normal. Judgment and thought content normal.           Assessment and Plan

## 2011-07-16 NOTE — Assessment & Plan Note (Signed)
Total cholesterol at goal. LDL slightly elevated. Continue current medications for now. We have encouraged continued exercise, careful diet management in an effort to lose weight.

## 2011-07-16 NOTE — Assessment & Plan Note (Signed)
Previous PE in the setting of being sedentary post cardiac catheterization. Shortness of breath has resolved

## 2011-07-16 NOTE — Patient Instructions (Signed)
You are doing well. No medication changes were made.  Please call us if you have new issues that need to be addressed before your next appt.  The office will contact you for a follow up Appt. In 6 months  

## 2011-07-16 NOTE — Assessment & Plan Note (Signed)
Blood pressure is well controlled on today's visit. No changes made to the medications. He is on triple anticoagulation and we will recommend low INR, goal in the low 2 range. In 6 months time, we would likely stop the Plavix.

## 2011-07-16 NOTE — Assessment & Plan Note (Signed)
No episodes of palpitations concerning for arrhythmia. EKG is normal today. No medication changes made. He is currently on warfarin.

## 2011-07-23 ENCOUNTER — Encounter: Payer: Self-pay | Admitting: Internal Medicine

## 2011-07-23 ENCOUNTER — Ambulatory Visit (INDEPENDENT_AMBULATORY_CARE_PROVIDER_SITE_OTHER): Payer: Medicare Other | Admitting: Internal Medicine

## 2011-07-23 ENCOUNTER — Telehealth: Payer: Self-pay | Admitting: *Deleted

## 2011-07-23 DIAGNOSIS — K4090 Unilateral inguinal hernia, without obstruction or gangrene, not specified as recurrent: Secondary | ICD-10-CM

## 2011-07-23 DIAGNOSIS — I4891 Unspecified atrial fibrillation: Secondary | ICD-10-CM

## 2011-07-23 DIAGNOSIS — N183 Chronic kidney disease, stage 3 unspecified: Secondary | ICD-10-CM | POA: Insufficient documentation

## 2011-07-23 LAB — COMPREHENSIVE METABOLIC PANEL
ALT: 17 U/L (ref 0–53)
CO2: 27 mEq/L (ref 19–32)
Calcium: 9.4 mg/dL (ref 8.4–10.5)
Chloride: 106 mEq/L (ref 96–112)
GFR: 51.88 mL/min — ABNORMAL LOW (ref >60.00)
Sodium: 142 mEq/L (ref 135–145)
Total Protein: 7.4 g/dL (ref 6.0–8.3)

## 2011-07-23 MED ORDER — WARFARIN SODIUM 10 MG PO TABS: 10.0000 mg | ORAL_TABLET | Freq: Every day | ORAL | Status: DC

## 2011-07-23 MED ORDER — METOPROLOL TARTRATE 50 MG PO TABS: 25.0000 mg | ORAL_TABLET | Freq: Two times a day (BID) | ORAL | Status: DC

## 2011-07-23 MED ORDER — CLOPIDOGREL BISULFATE 75 MG PO TABS: 75.0000 mg | ORAL_TABLET | Freq: Every day | ORAL | Status: DC

## 2011-07-23 MED ORDER — DILTIAZEM HCL ER COATED BEADS 240 MG PO CP24: 240.0000 mg | ORAL_CAPSULE | Freq: Every day | ORAL | Status: DC

## 2011-07-23 NOTE — Progress Notes (Signed)
Subjective:    Patient ID: William Taylor, male    DOB: Mar 18, 1930, 77 y.o.   MRN: [id number]  HPI 77YO male with CKD, HTN, atrial fibrillation, CAD presents for follow up. Primary concern today is pain and bulge in left groin area. This bulge is worse with straining during constipation. However, in between episodes, there is no pain or bulge.  Pt denies nausea, vomiting, diarrhea. No blood in stool. Was concerned this bulge may be related to previous catheterization site.  Otherwise, doing well. Denies chest pain, dyspnea. Recently seen by cardiology. Continues on warfarin for anticoagulation, and has been checking INR there. No recent bruising or bleeding.  Outpatient Encounter Prescriptions as of 07/23/2011  Medication Sig Dispense Refill  . aspirin 81 MG EC tablet Take 81 mg by mouth daily.        . clopidogrel (PLAVIX) 75 MG tablet Take 1 tablet (75 mg total) by mouth daily.  90 tablet  3  . cyproheptadine (PERIACTIN) 4 MG tablet Take 4 mg by mouth 3 (three) times daily as needed.        . diltiazem (CARDIZEM CD) 240 MG 24 hr capsule Take 1 capsule (240 mg total) by mouth daily.  90 capsule  3  . fenofibrate micronized (ANTARA) 130 MG capsule Take 130 mg by mouth daily before breakfast.       . finasteride (PROSCAR) 5 MG tablet Take 5 mg by mouth daily.        Marland Kitchen levobunolol (BETAGAN) 0.5 % ophthalmic solution 1 drop at bedtime.        . metoprolol (LOPRESSOR) 50 MG tablet Take 0.5 tablets (25 mg total) by mouth 2 (two) times daily.  90 tablet  2  . NEXIUM 40 MG capsule TAKE 1 CAPSULE DAILY  90 capsule  3  . nitroGLYCERIN (NITROSTAT) 0.4 MG SL tablet Place 1 tablet (0.4 mg total) under the tongue every 5 (five) minutes as needed for chest pain.  25 tablet  3  . Omega-3 Fatty Acids (FISH OIL) 1000 MG CAPS Take 1,000 mg by mouth daily.       . pravastatin (PRAVACHOL) 40 MG tablet Take 40 mg by mouth daily.        . Probiotic Product (PROBIOTIC FORMULA PO) Take by mouth.        . warfarin  (COUMADIN) 10 MG tablet Take 1 tablet (10 mg total) by mouth daily. OR as directed by coumadin clinic  100 tablet  3    Review of Systems  Constitutional: Negative for fever, chills, activity change, appetite change, fatigue and unexpected weight change.  Eyes: Negative for visual disturbance.  Respiratory: Negative for cough and shortness of breath.   Cardiovascular: Positive for leg swelling. Negative for chest pain and palpitations.  Gastrointestinal: Positive for abdominal pain and constipation. Negative for abdominal distention.  Genitourinary: Negative for dysuria, urgency and difficulty urinating.  Musculoskeletal: Negative for arthralgias and gait problem.  Skin: Negative for color change and rash.  Hematological: Negative for adenopathy.  Psychiatric/Behavioral: Negative for sleep disturbance and dysphoric mood. The patient is not nervous/anxious.    BP 140/80  Pulse 48  Temp(Src) 97.7 F (36.5 C) (Oral)  Wt 223 lb (101.152 kg)  SpO2 97%     Objective:   Physical Exam  Constitutional: He is oriented to person, place, and time. He appears well-developed and well-nourished. No distress.  HENT:  Head: Normocephalic and atraumatic.  Right Ear: External ear normal.  Left Ear: External ear normal.  Nose: Nose normal.  Mouth/Throat: Oropharynx is clear and moist. No oropharyngeal exudate.  Eyes: Conjunctivae and EOM are normal. Pupils are equal, round, and reactive to light. Right eye exhibits no discharge. Left eye exhibits no discharge. No scleral icterus.  Neck: Normal range of motion. Neck supple. No tracheal deviation present. No thyromegaly present.  Cardiovascular: Normal rate, regular rhythm and normal heart sounds.  Exam reveals no gallop and no friction rub.   No murmur heard. Pulmonary/Chest: Effort normal and breath sounds normal. No respiratory distress. He has no wheezes. He has no rales. He exhibits no tenderness.  Abdominal: Soft. Bowel sounds are normal. He  exhibits no distension and no mass. There is no tenderness. There is no rebound and no guarding.    Musculoskeletal: Normal range of motion. He exhibits no edema.  Lymphadenopathy:    He has no cervical adenopathy.  Neurological: He is alert and oriented to person, place, and time. No cranial nerve deficit. Coordination normal.  Skin: Skin is warm and dry. No rash noted. He is not diaphoretic. No erythema. No pallor.  Psychiatric: He has a normal mood and affect. His behavior is normal. Judgment and thought content normal.          Assessment & Plan:  1. Left inguinal hernia - Will set up surgical evaluation. Information on hernias given to pt.  2. Atrial fibrillation - Rate controlled. Anticoagulated with warfarin and plavix. Per Dr. Windell Hummingbird notes, will stop plavix in 6 months.  3. CKD - Will check renal function with labs today.

## 2011-07-23 NOTE — Patient Instructions (Signed)

## 2011-07-23 NOTE — Telephone Encounter (Signed)
Pt left VM w/name of surgeon - Dr Renda Rolls

## 2011-07-23 NOTE — Telephone Encounter (Signed)
Carollee Herter, This is the surgeon to set him up with for inguinal hernia. Renda Rolls at [facility]

## 2011-07-24 ENCOUNTER — Encounter: Payer: Medicare Other | Admitting: Emergency Medicine

## 2011-07-24 NOTE — Telephone Encounter (Signed)
I have noted that patient wants to see Dr. Renda Rolls at [facility] in the Referral.

## 2011-07-31 ENCOUNTER — Ambulatory Visit (INDEPENDENT_AMBULATORY_CARE_PROVIDER_SITE_OTHER): Payer: Medicare Other | Admitting: Emergency Medicine

## 2011-07-31 DIAGNOSIS — Z7901 Long term (current) use of anticoagulants: Secondary | ICD-10-CM

## 2011-07-31 DIAGNOSIS — I4891 Unspecified atrial fibrillation: Secondary | ICD-10-CM

## 2011-07-31 DIAGNOSIS — I2699 Other pulmonary embolism without acute cor pulmonale: Secondary | ICD-10-CM

## 2011-08-19 ENCOUNTER — Telehealth: Payer: Self-pay | Admitting: *Deleted

## 2011-08-19 NOTE — Telephone Encounter (Signed)
Asking for clearance for pt to have open left inguinal hernia repair. Pt seen 07/16/11 per note: cardiac catheterization Dec 25 2010, showing 90% OM 2 disease, transferred to [facility] with DES stent placed, 2.25 x 16 mm POMUS stent, with flank pain developing after discharge, admitted to [facility] and diagnosed with pulmonary embolism with heavy clot burden. He is on Warfarin for PE/A fib and Plavix and ASA 81.  Dr. Michaelle Copas office asking if and what he would be able to hold (as far as Plavix since recent stent). Normally they do 5-7 days but want to know if you would advise he hold less for the surgery for Plavix. Please advise.

## 2011-08-20 HISTORY — PX: HERNIA REPAIR: SHX51

## 2011-08-22 ENCOUNTER — Encounter: Payer: Self-pay | Admitting: *Deleted

## 2011-08-23 NOTE — Telephone Encounter (Signed)
Notified office pt not clear for surgery, can wait until May 2013 to hold Plavix and ASA. Letter sent.

## 2011-08-28 ENCOUNTER — Ambulatory Visit (INDEPENDENT_AMBULATORY_CARE_PROVIDER_SITE_OTHER): Payer: Medicare Other | Admitting: Emergency Medicine

## 2011-08-28 DIAGNOSIS — Z7901 Long term (current) use of anticoagulants: Secondary | ICD-10-CM | POA: Diagnosis not present

## 2011-08-28 DIAGNOSIS — I2699 Other pulmonary embolism without acute cor pulmonale: Secondary | ICD-10-CM | POA: Diagnosis not present

## 2011-08-28 DIAGNOSIS — I4891 Unspecified atrial fibrillation: Secondary | ICD-10-CM | POA: Diagnosis not present

## 2011-09-09 DIAGNOSIS — C61 Malignant neoplasm of prostate: Secondary | ICD-10-CM | POA: Diagnosis not present

## 2011-09-09 DIAGNOSIS — N139 Obstructive and reflux uropathy, unspecified: Secondary | ICD-10-CM | POA: Diagnosis not present

## 2011-09-09 DIAGNOSIS — R39198 Other difficulties with micturition: Secondary | ICD-10-CM | POA: Diagnosis not present

## 2011-09-10 DIAGNOSIS — M999 Biomechanical lesion, unspecified: Secondary | ICD-10-CM | POA: Diagnosis not present

## 2011-09-10 DIAGNOSIS — M5137 Other intervertebral disc degeneration, lumbosacral region: Secondary | ICD-10-CM | POA: Diagnosis not present

## 2011-09-10 DIAGNOSIS — IMO0002 Reserved for concepts with insufficient information to code with codable children: Secondary | ICD-10-CM | POA: Diagnosis not present

## 2011-09-13 DIAGNOSIS — L989 Disorder of the skin and subcutaneous tissue, unspecified: Secondary | ICD-10-CM | POA: Diagnosis not present

## 2011-09-13 DIAGNOSIS — Z85828 Personal history of other malignant neoplasm of skin: Secondary | ICD-10-CM | POA: Diagnosis not present

## 2011-09-13 DIAGNOSIS — L570 Actinic keratosis: Secondary | ICD-10-CM | POA: Diagnosis not present

## 2011-09-13 DIAGNOSIS — D485 Neoplasm of uncertain behavior of skin: Secondary | ICD-10-CM | POA: Diagnosis not present

## 2011-09-24 DIAGNOSIS — I2510 Atherosclerotic heart disease of native coronary artery without angina pectoris: Secondary | ICD-10-CM | POA: Diagnosis not present

## 2011-09-24 DIAGNOSIS — I10 Essential (primary) hypertension: Secondary | ICD-10-CM | POA: Diagnosis not present

## 2011-09-24 DIAGNOSIS — N183 Chronic kidney disease, stage 3 unspecified: Secondary | ICD-10-CM | POA: Diagnosis not present

## 2011-09-25 ENCOUNTER — Ambulatory Visit (INDEPENDENT_AMBULATORY_CARE_PROVIDER_SITE_OTHER): Payer: Medicare Other | Admitting: Emergency Medicine

## 2011-09-25 DIAGNOSIS — Z7901 Long term (current) use of anticoagulants: Secondary | ICD-10-CM | POA: Diagnosis not present

## 2011-09-25 DIAGNOSIS — I2699 Other pulmonary embolism without acute cor pulmonale: Secondary | ICD-10-CM | POA: Diagnosis not present

## 2011-09-25 DIAGNOSIS — I4891 Unspecified atrial fibrillation: Secondary | ICD-10-CM | POA: Diagnosis not present

## 2011-10-01 ENCOUNTER — Telehealth: Payer: Self-pay

## 2011-10-01 NOTE — Telephone Encounter (Signed)
The patient would like to have the inguinal hernia repair in March due to he continues to have pain and does not think he can wait until May.  What needs to take place since just had the cardiac cath. They would like for him to stay off the plavix, aspirin & warfarin but if too soon to come off may need lovenox bridge. What do you recommend?

## 2011-10-03 NOTE — Telephone Encounter (Signed)
Notified Leigh with [facility] clinic surgery dept. A letter will be sent to [facility] regarding surgery and medications to hold.

## 2011-10-03 NOTE — Telephone Encounter (Signed)
Higher risk to come off asa and plavix, though it is certainly his choice and we understand he may have significant discomfort from the hernia.  Usually, the surgeon is ok with aspirin. Who is the surgeon and when is it scheduled. We would probably hold warfarin and plavix, possibly continue low dose aspirin through the procedure then restart plavix after.   Uncertain if he needs to restart warfarin after surgery.

## 2011-10-04 NOTE — Telephone Encounter (Signed)
Letter faxed to [facility] surgery dept for cardiac clearance & medication adjustment prior to hernia repair.

## 2011-10-07 DIAGNOSIS — M5137 Other intervertebral disc degeneration, lumbosacral region: Secondary | ICD-10-CM | POA: Diagnosis not present

## 2011-10-07 DIAGNOSIS — M999 Biomechanical lesion, unspecified: Secondary | ICD-10-CM | POA: Diagnosis not present

## 2011-10-07 DIAGNOSIS — M9981 Other biomechanical lesions of cervical region: Secondary | ICD-10-CM | POA: Diagnosis not present

## 2011-10-07 DIAGNOSIS — M5030 Other cervical disc degeneration, unspecified cervical region: Secondary | ICD-10-CM | POA: Diagnosis not present

## 2011-10-10 DIAGNOSIS — H4010X0 Unspecified open-angle glaucoma, stage unspecified: Secondary | ICD-10-CM | POA: Diagnosis not present

## 2011-10-18 ENCOUNTER — Other Ambulatory Visit: Payer: Self-pay | Admitting: Internal Medicine

## 2011-10-18 ENCOUNTER — Encounter: Payer: Self-pay | Admitting: Internal Medicine

## 2011-10-21 DIAGNOSIS — M999 Biomechanical lesion, unspecified: Secondary | ICD-10-CM | POA: Diagnosis not present

## 2011-10-21 DIAGNOSIS — M5137 Other intervertebral disc degeneration, lumbosacral region: Secondary | ICD-10-CM | POA: Diagnosis not present

## 2011-10-23 ENCOUNTER — Ambulatory Visit (INDEPENDENT_AMBULATORY_CARE_PROVIDER_SITE_OTHER): Payer: Medicare Other

## 2011-10-23 DIAGNOSIS — L989 Disorder of the skin and subcutaneous tissue, unspecified: Secondary | ICD-10-CM | POA: Diagnosis not present

## 2011-10-23 DIAGNOSIS — I2699 Other pulmonary embolism without acute cor pulmonale: Secondary | ICD-10-CM

## 2011-10-23 DIAGNOSIS — I4891 Unspecified atrial fibrillation: Secondary | ICD-10-CM

## 2011-10-23 DIAGNOSIS — Z7901 Long term (current) use of anticoagulants: Secondary | ICD-10-CM

## 2011-10-23 DIAGNOSIS — C44319 Basal cell carcinoma of skin of other parts of face: Secondary | ICD-10-CM | POA: Diagnosis not present

## 2011-10-23 LAB — POCT INR: INR: 1.7

## 2011-10-29 ENCOUNTER — Ambulatory Visit: Payer: Self-pay | Admitting: Surgery

## 2011-10-29 DIAGNOSIS — K4090 Unilateral inguinal hernia, without obstruction or gangrene, not specified as recurrent: Secondary | ICD-10-CM | POA: Diagnosis not present

## 2011-10-29 DIAGNOSIS — Z01812 Encounter for preprocedural laboratory examination: Secondary | ICD-10-CM | POA: Diagnosis not present

## 2011-10-29 LAB — BASIC METABOLIC PANEL
Anion Gap: 11 (ref 7–16)
Calcium, Total: 8.8 mg/dL (ref 8.5–10.1)
Chloride: 104 mmol/L (ref 98–107)
Co2: 27 mmol/L (ref 21–32)
Creatinine: 1.11 mg/dL (ref 0.60–1.30)
Osmolality: 284 (ref 275–301)
Potassium: 4.1 mmol/L (ref 3.5–5.1)

## 2011-10-29 LAB — CBC WITH DIFFERENTIAL/PLATELET
Basophil #: 0 10*3/uL (ref 0.0–0.1)
Basophil %: 0.5 %
Eosinophil %: 1.8 %
HGB: 13 g/dL (ref 13.0–18.0)
Lymphocyte #: 1.9 10*3/uL (ref 1.0–3.6)
MCH: 33.1 pg (ref 26.0–34.0)
MCHC: 33.3 g/dL (ref 32.0–36.0)
MCV: 99 fL (ref 80–100)
Monocyte %: 8.2 %
Neutrophil %: 63.6 %
Platelet: 177 10*3/uL (ref 150–440)
RBC: 3.94 10*6/uL — ABNORMAL LOW (ref 4.40–5.90)
RDW: 13.6 % (ref 11.5–14.5)
WBC: 7.2 10*3/uL (ref 3.8–10.6)

## 2011-11-04 DIAGNOSIS — M999 Biomechanical lesion, unspecified: Secondary | ICD-10-CM | POA: Diagnosis not present

## 2011-11-04 DIAGNOSIS — M5137 Other intervertebral disc degeneration, lumbosacral region: Secondary | ICD-10-CM | POA: Diagnosis not present

## 2011-11-05 ENCOUNTER — Ambulatory Visit: Payer: Self-pay | Admitting: Surgery

## 2011-11-05 DIAGNOSIS — R609 Edema, unspecified: Secondary | ICD-10-CM | POA: Diagnosis not present

## 2011-11-05 DIAGNOSIS — Z7901 Long term (current) use of anticoagulants: Secondary | ICD-10-CM | POA: Diagnosis not present

## 2011-11-05 DIAGNOSIS — Z9861 Coronary angioplasty status: Secondary | ICD-10-CM | POA: Diagnosis not present

## 2011-11-05 DIAGNOSIS — Z79899 Other long term (current) drug therapy: Secondary | ICD-10-CM | POA: Diagnosis not present

## 2011-11-05 DIAGNOSIS — K4090 Unilateral inguinal hernia, without obstruction or gangrene, not specified as recurrent: Secondary | ICD-10-CM | POA: Diagnosis not present

## 2011-11-05 DIAGNOSIS — Z7902 Long term (current) use of antithrombotics/antiplatelets: Secondary | ICD-10-CM | POA: Diagnosis not present

## 2011-11-05 DIAGNOSIS — D176 Benign lipomatous neoplasm of spermatic cord: Secondary | ICD-10-CM | POA: Diagnosis not present

## 2011-11-05 DIAGNOSIS — Z8249 Family history of ischemic heart disease and other diseases of the circulatory system: Secondary | ICD-10-CM | POA: Diagnosis not present

## 2011-11-05 DIAGNOSIS — K219 Gastro-esophageal reflux disease without esophagitis: Secondary | ICD-10-CM | POA: Diagnosis not present

## 2011-11-05 DIAGNOSIS — Z85828 Personal history of other malignant neoplasm of skin: Secondary | ICD-10-CM | POA: Diagnosis not present

## 2011-11-05 DIAGNOSIS — I4891 Unspecified atrial fibrillation: Secondary | ICD-10-CM | POA: Diagnosis not present

## 2011-11-05 DIAGNOSIS — K4091 Unilateral inguinal hernia, without obstruction or gangrene, recurrent: Secondary | ICD-10-CM | POA: Diagnosis not present

## 2011-11-05 DIAGNOSIS — I2510 Atherosclerotic heart disease of native coronary artery without angina pectoris: Secondary | ICD-10-CM | POA: Diagnosis not present

## 2011-11-05 LAB — APTT: Activated PTT: 27.6 secs (ref 23.6–35.9)

## 2011-11-05 LAB — PROTIME-INR
INR: 1.1
Prothrombin Time: 14.3 secs (ref 11.5–14.7)

## 2011-11-07 DIAGNOSIS — C61 Malignant neoplasm of prostate: Secondary | ICD-10-CM | POA: Diagnosis not present

## 2011-11-07 DIAGNOSIS — R338 Other retention of urine: Secondary | ICD-10-CM | POA: Diagnosis not present

## 2011-11-14 DIAGNOSIS — R338 Other retention of urine: Secondary | ICD-10-CM | POA: Diagnosis not present

## 2011-11-27 ENCOUNTER — Ambulatory Visit (INDEPENDENT_AMBULATORY_CARE_PROVIDER_SITE_OTHER): Payer: Medicare Other

## 2011-11-27 DIAGNOSIS — I4891 Unspecified atrial fibrillation: Secondary | ICD-10-CM | POA: Diagnosis not present

## 2011-11-27 DIAGNOSIS — Z7901 Long term (current) use of anticoagulants: Secondary | ICD-10-CM

## 2011-11-27 DIAGNOSIS — I2699 Other pulmonary embolism without acute cor pulmonale: Secondary | ICD-10-CM | POA: Diagnosis not present

## 2011-12-02 DIAGNOSIS — M5137 Other intervertebral disc degeneration, lumbosacral region: Secondary | ICD-10-CM | POA: Diagnosis not present

## 2011-12-02 DIAGNOSIS — M999 Biomechanical lesion, unspecified: Secondary | ICD-10-CM | POA: Diagnosis not present

## 2011-12-12 ENCOUNTER — Other Ambulatory Visit: Payer: Self-pay | Admitting: Internal Medicine

## 2011-12-12 MED ORDER — FENOFIBRATE MICRONIZED 130 MG PO CAPS: 130.0000 mg | ORAL_CAPSULE | Freq: Every day | ORAL | Status: DC

## 2011-12-12 NOTE — Telephone Encounter (Signed)
Pt has follow up on 01/22/12.  RX sent to [facility] per Erie Noe.

## 2011-12-12 NOTE — Telephone Encounter (Signed)
Refill on Antara 130 mg.

## 2011-12-18 DIAGNOSIS — R338 Other retention of urine: Secondary | ICD-10-CM | POA: Diagnosis not present

## 2011-12-18 DIAGNOSIS — C61 Malignant neoplasm of prostate: Secondary | ICD-10-CM | POA: Diagnosis not present

## 2011-12-25 ENCOUNTER — Ambulatory Visit (INDEPENDENT_AMBULATORY_CARE_PROVIDER_SITE_OTHER): Payer: Medicare Other

## 2011-12-25 DIAGNOSIS — I4891 Unspecified atrial fibrillation: Secondary | ICD-10-CM | POA: Diagnosis not present

## 2011-12-25 DIAGNOSIS — Z7901 Long term (current) use of anticoagulants: Secondary | ICD-10-CM

## 2011-12-25 DIAGNOSIS — I2699 Other pulmonary embolism without acute cor pulmonale: Secondary | ICD-10-CM

## 2011-12-31 DIAGNOSIS — M999 Biomechanical lesion, unspecified: Secondary | ICD-10-CM | POA: Diagnosis not present

## 2011-12-31 DIAGNOSIS — IMO0002 Reserved for concepts with insufficient information to code with codable children: Secondary | ICD-10-CM | POA: Diagnosis not present

## 2011-12-31 DIAGNOSIS — M5137 Other intervertebral disc degeneration, lumbosacral region: Secondary | ICD-10-CM | POA: Diagnosis not present

## 2012-01-20 ENCOUNTER — Ambulatory Visit: Payer: Self-pay | Admitting: Cardiovascular Disease

## 2012-01-20 ENCOUNTER — Encounter: Payer: Self-pay | Admitting: Cardiovascular Disease

## 2012-01-20 ENCOUNTER — Ambulatory Visit (INDEPENDENT_AMBULATORY_CARE_PROVIDER_SITE_OTHER): Payer: Medicare Other | Admitting: Cardiovascular Disease

## 2012-01-20 VITALS — BP 134/82 | HR 63 | Ht 76.0 in | Wt 233.2 lb

## 2012-01-20 DIAGNOSIS — I2699 Other pulmonary embolism without acute cor pulmonale: Secondary | ICD-10-CM

## 2012-01-20 DIAGNOSIS — I10 Essential (primary) hypertension: Secondary | ICD-10-CM | POA: Diagnosis not present

## 2012-01-20 DIAGNOSIS — I2510 Atherosclerotic heart disease of native coronary artery without angina pectoris: Secondary | ICD-10-CM | POA: Diagnosis not present

## 2012-01-20 DIAGNOSIS — I4891 Unspecified atrial fibrillation: Secondary | ICD-10-CM

## 2012-01-20 DIAGNOSIS — Z7901 Long term (current) use of anticoagulants: Secondary | ICD-10-CM

## 2012-01-20 NOTE — Progress Notes (Signed)
Patient ID: William Taylor, male    DOB: 29-Aug-1929, 78 y.o.   MRN: [id number]  HPI Comments: Mr. Aikey is a very pleasant 78 year old gentleman with a history of coronary artery disease, stent in 2005, repeat catheterization in 2009 showing patent stent with no other significant disease, normal systolic function in September 2011 who presented to the emergency room September 2011 with malaise, atrial fibrillation with RVR in the setting of a urinary tract infection, recent admission to the hospital November 14 2010 for atrial fibrillation that developed after pushing mowing with some chest pain. He converted back to normal sinus rhythm, Presenting to [facility] with chest discomfort with cardiac catheterization Dec 25 2010, showing 90% OM 2 disease, transferred to [facility]  with DES stent placed, 2.25 x 16 mm POMUS stent, with flank pain developing after discharge, admitted to [facility] and diagnosed with pulmonary embolism with heavy clot burden.   Today he presents for routine followup. Overall he has been doing well for the first part of the year. He does report having left arm pain caused by the warfarin. Some right arm discomfort as well. He was on warfarin in the past and had similar symptoms that resolved when he discontinued warfarin. He denies any significant chest pain, shortness of breath or leg swelling.    EKG shows normal sinus rhythm with rate 63 beats per minute with no significant ST or T-wave changes.   Outpatient Encounter Prescriptions as of 01/20/2012  Medication Sig Dispense Refill  . aspirin 81 MG EC tablet Take 81 mg by mouth daily.        . clopidogrel (PLAVIX) 75 MG tablet Take 1 tablet (75 mg total) by mouth daily.  90 tablet  3  . diltiazem (CARDIZEM CD) 240 MG 24 hr capsule Take 1 capsule (240 mg total) by mouth daily.  90 capsule  3  . fenofibrate micronized (ANTARA) 130 MG capsule Take 1 capsule (130 mg total) by mouth daily before breakfast.  90 capsule  1  . finasteride  (PROSCAR) 5 MG tablet Take 5 mg by mouth daily.        Marland Kitchen levobunolol (BETAGAN) 0.5 % ophthalmic solution 1 drop 2 (two) times daily.       . metoprolol tartrate (LOPRESSOR) 25 MG tablet Take 25 mg by mouth 2 (two) times daily.      Marland Kitchen NEXIUM 40 MG capsule TAKE 1 CAPSULE DAILY  90 capsule  3  . Omega-3 Fatty Acids (FISH OIL) 1000 MG CAPS Take 1,000 mg by mouth 2 (two) times daily.       . pravastatin (PRAVACHOL) 40 MG tablet TAKE 1 TABLET AT BEDTIME  90 tablet  2  . Tamsulosin HCl (FLOMAX) 0.4 MG CAPS Take 0.4 mg by mouth daily.      Marland Kitchen VITAMIN D, CHOLECALCIFEROL, PO Take by mouth.      . warfarin (COUMADIN) 10 MG tablet Take 10 mg by mouth daily. Take 10mg  Monday Wednesday and Friday. Takes 1/2 tablet Tuesday, Thursday, Saturday and Sunday.      . warfarin (COUMADIN) 5 MG tablet Take 5 mg by mouth daily. Takes 5 mg Monday, Wednesday and Friday and 1/2 tablet Tuesday, Thursday, Saturday and Sunday.      . nitroGLYCERIN (NITROSTAT) 0.4 MG SL tablet Place 1 tablet (0.4 mg total) under the tongue every 5 (five) minutes as needed for chest pain.  25 tablet  3    Review of Systems  Constitutional: Negative.   HENT: Negative.  Eyes: Negative.   Respiratory: Negative.   Cardiovascular: Negative.   Gastrointestinal: Negative.   Musculoskeletal: Negative.   Skin: Negative.   Neurological: Negative.   Hematological: Negative.   Psychiatric/Behavioral: Negative.   All other systems reviewed and are negative.    BP 134/82  Pulse 63  Ht 6\' 4"  (1.93 m)  Wt 233 lb 4 oz (105.802 kg)  BMI 28.39 kg/m2  Physical Exam  Nursing note and vitals reviewed. Constitutional: He is oriented to person, place, and time. He appears well-developed and well-nourished.  HENT:  Head: Normocephalic.  Nose: Nose normal.  Mouth/Throat: Oropharynx is clear and moist.  Eyes: Conjunctivae are normal. Pupils are equal, round, and reactive to light.  Neck: Normal range of motion. Neck supple. No JVD present.    Cardiovascular: Normal rate, regular rhythm, S1 normal, S2 normal, normal heart sounds and intact distal pulses.  Exam reveals no gallop and no friction rub.   No murmur heard. Pulmonary/Chest: Effort normal and breath sounds normal. No respiratory distress. He has no wheezes. He has no rales. He exhibits no tenderness.  Abdominal: Soft. Bowel sounds are normal. He exhibits no distension. There is no tenderness.  Musculoskeletal: Normal range of motion. He exhibits no edema and no tenderness.  Lymphadenopathy:    He has no cervical adenopathy.  Neurological: He is alert and oriented to person, place, and time. Coordination normal.  Skin: Skin is warm and dry. No rash noted. No erythema.  Psychiatric: He has a normal mood and affect. His behavior is normal. Judgment and thought content normal.           Assessment and Plan

## 2012-01-20 NOTE — Assessment & Plan Note (Signed)
Over 6 months since his DVT and PE post catheterization. He is interested in stopping warfarin as he reports having side effects from this including left arm pain. He will closely monitor his heart rate and we will cut the warfarin in half to avoid INR visits and see if his symptoms resolve.

## 2012-01-20 NOTE — Assessment & Plan Note (Signed)
Currently with no symptoms of angina. No further workup at this time. Continue current medication regimen. 

## 2012-01-20 NOTE — Assessment & Plan Note (Signed)
No recent episodes of atrial fibrillation. We will decrease the warfarin by half and closely monitor his heart rhythm.

## 2012-01-20 NOTE — Assessment & Plan Note (Signed)
Blood pressure is well controlled on today's visit. No changes made to the medications. 

## 2012-01-20 NOTE — Patient Instructions (Signed)
You are doing well. Please cut the warfarin in 1/2 Closely monitor your heart rate at home with a pulse oxymeter No INR checks needed  Please call us if you have new issues that need to be addressed before your next appt.  Your physician wants you to follow-up in: 6 months.  You will receive a reminder letter in the mail two months in advance. If you don't receive a letter, please call our office to schedule the follow-up appointment.

## 2012-01-21 ENCOUNTER — Other Ambulatory Visit: Payer: Self-pay | Admitting: Internal Medicine

## 2012-01-22 ENCOUNTER — Encounter: Payer: Self-pay | Admitting: Internal Medicine

## 2012-01-22 ENCOUNTER — Ambulatory Visit (INDEPENDENT_AMBULATORY_CARE_PROVIDER_SITE_OTHER): Payer: Medicare Other | Admitting: Internal Medicine

## 2012-01-22 VITALS — BP 122/90 | HR 65 | Temp 98.0°F | Ht 76.0 in | Wt 232.8 lb

## 2012-01-22 DIAGNOSIS — E785 Hyperlipidemia, unspecified: Secondary | ICD-10-CM

## 2012-01-22 DIAGNOSIS — I10 Essential (primary) hypertension: Secondary | ICD-10-CM | POA: Diagnosis not present

## 2012-01-22 DIAGNOSIS — Z7901 Long term (current) use of anticoagulants: Secondary | ICD-10-CM

## 2012-01-22 DIAGNOSIS — Z23 Encounter for immunization: Secondary | ICD-10-CM

## 2012-01-22 DIAGNOSIS — I2699 Other pulmonary embolism without acute cor pulmonale: Secondary | ICD-10-CM | POA: Diagnosis not present

## 2012-01-22 DIAGNOSIS — N183 Chronic kidney disease, stage 3 unspecified: Secondary | ICD-10-CM

## 2012-01-22 LAB — COMPREHENSIVE METABOLIC PANEL
ALT: 14 U/L (ref 0–53)
AST: 18 U/L (ref 0–37)
Albumin: 4.1 g/dL (ref 3.5–5.2)
CO2: 27 mEq/L (ref 19–32)
Calcium: 9 mg/dL (ref 8.4–10.5)
Chloride: 106 mEq/L (ref 96–112)
GFR: 65.67 mL/min (ref >60.00)
Potassium: 3.7 mEq/L (ref 3.5–5.1)
Sodium: 142 mEq/L (ref 135–145)
Total Protein: 6.9 g/dL (ref 6.0–8.3)

## 2012-01-22 LAB — LIPID PANEL: Total CHOL/HDL Ratio: 3

## 2012-01-22 MED ORDER — ZOSTER VACCINE LIVE 19400 UNT/0.65ML ~~LOC~~ SOLR: 0.6500 mL | Freq: Once | SUBCUTANEOUS | Status: AC

## 2012-01-22 MED ORDER — WARFARIN SODIUM 2 MG PO TABS: 2.0000 mg | ORAL_TABLET | Freq: Every day | ORAL | Status: DC

## 2012-01-22 NOTE — Assessment & Plan Note (Signed)
Patient has history of pulmonary embolism. His dose of Coumadin has been weaned by his cardiologist because of intolerance to this medication. He has completed over 6 months of anticoagulation therapy. Will continue to monitor.

## 2012-01-22 NOTE — Assessment & Plan Note (Signed)
Blood pressure well-controlled today. Will check renal function with labs. Continue current medications. Followup 6 months.

## 2012-01-22 NOTE — Assessment & Plan Note (Signed)
Will recheck kidney function with labs today. Followup 6 months.

## 2012-01-22 NOTE — Assessment & Plan Note (Signed)
Goal LDL less than 70. Will check lipids with labs today. Continue pravastatin. Followup 6 months.

## 2012-01-22 NOTE — Progress Notes (Signed)
Subjective:    Patient ID: William Taylor, male    DOB: [birth year], 78 y.o.   MRN: [id number]  HPI 78 year old male with history of CAD, hypertension, hyperlipidemia, and pulmonary embolism presents for followup. He reports that he is doing well. He notes that his dose of Coumadin was cut by his cardiologist because of significant intolerance to this medication with pain in both of his arms. He reports some improvement in the pain in his arms with reduction in dose. He denies any shortness of breath, chest pain, palpitations. He reports full compliance with all his medications. He denies any new concerns today.  Outpatient Encounter Prescriptions as of 01/22/2012  Medication Sig Dispense Refill  . aspirin 81 MG EC tablet Take 81 mg by mouth daily.        . clopidogrel (PLAVIX) 75 MG tablet Take 1 tablet (75 mg total) by mouth daily.  90 tablet  3  . diltiazem (CARDIZEM CD) 240 MG 24 hr capsule Take 1 capsule (240 mg total) by mouth daily.  90 capsule  3  . fenofibrate micronized (ANTARA) 130 MG capsule Take 1 capsule (130 mg total) by mouth daily before breakfast.  90 capsule  1  . finasteride (PROSCAR) 5 MG tablet Take 5 mg by mouth daily.        Marland Kitchen levobunolol (BETAGAN) 0.5 % ophthalmic solution 1 drop 2 (two) times daily.       . metoprolol tartrate (LOPRESSOR) 25 MG tablet Take 25 mg by mouth 2 (two) times daily.      Marland Kitchen NEXIUM 40 MG capsule TAKE 1 CAPSULE DAILY  90 capsule  3  . nitroGLYCERIN (NITROSTAT) 0.4 MG SL tablet Place 1 tablet (0.4 mg total) under the tongue every 5 (five) minutes as needed for chest pain.  25 tablet  3  . Omega-3 Fatty Acids (FISH OIL) 1000 MG CAPS Take 1,000 mg by mouth 2 (two) times daily.       . pravastatin (PRAVACHOL) 40 MG tablet TAKE 1 TABLET AT BEDTIME  90 tablet  2  . Tamsulosin HCl (FLOMAX) 0.4 MG CAPS Take 0.4 mg by mouth daily.      Marland Kitchen VITAMIN D, CHOLECALCIFEROL, PO Take by mouth.      . DISCONTD: warfarin (COUMADIN) 10 MG tablet Take 10 mg by mouth  daily. Take 10mg  Monday Wednesday and Friday. Takes 1/2 tablet Tuesday, Thursday, Saturday and Sunday.      Marland Kitchen DISCONTD: warfarin (COUMADIN) 5 MG tablet Take 5 mg by mouth daily. Takes 5 mg Monday, Wednesday and Friday and 1/2 tablet Tuesday, Thursday, Saturday and Sunday.      . warfarin (COUMADIN) 2 MG tablet Take 1 tablet (2 mg total) by mouth daily.  90 tablet  3  . zoster vaccine live, PF, (ZOSTAVAX) 40981 UNT/0.65ML injection Inject 19,400 Units into the skin once.  1 each  0  . DISCONTD: DILT-XR 240 MG 24 hr capsule TAKE 1 CAPSULE DAILY  30 capsule  5   BP 122/90  Pulse 65  Temp(Src) 98 F (36.7 C) (Oral)  Ht 6\' 4"  (1.93 m)  Wt 232 lb 12 oz (105.575 kg)  BMI 28.33 kg/m2  SpO2 95%  Review of Systems  Constitutional: Negative for fever, chills, activity change, appetite change, fatigue and unexpected weight change.  Eyes: Negative for visual disturbance.  Respiratory: Negative for cough and shortness of breath.   Cardiovascular: Negative for chest pain, palpitations and leg swelling.  Gastrointestinal: Negative for abdominal pain and abdominal distention.  Genitourinary: Negative for dysuria, urgency and difficulty urinating.  Musculoskeletal: Negative for arthralgias and gait problem.  Skin: Negative for color change and rash.  Hematological: Negative for adenopathy.  Psychiatric/Behavioral: Negative for sleep disturbance and dysphoric mood. The patient is not nervous/anxious.        Objective:   Physical Exam  Constitutional: He is oriented to person, place, and time. He appears well-developed and well-nourished. No distress.  HENT:  Head: Normocephalic and atraumatic.  Right Ear: External ear normal.  Left Ear: External ear normal.  Nose: Nose normal.  Mouth/Throat: Oropharynx is clear and moist. No oropharyngeal exudate.  Eyes: Conjunctivae and EOM are normal. Pupils are equal, round, and reactive to light. Right eye exhibits no discharge. Left eye exhibits no discharge.  No scleral icterus.  Neck: Normal range of motion. Neck supple. No tracheal deviation present. No thyromegaly present.  Cardiovascular: Normal rate, regular rhythm and normal heart sounds.  Exam reveals no gallop and no friction rub.   No murmur heard. Pulmonary/Chest: Effort normal and breath sounds normal. No respiratory distress. He has no wheezes. He has no rales. He exhibits no tenderness.  Abdominal: Soft. Bowel sounds are normal. He exhibits no distension. There is no tenderness.  Musculoskeletal: Normal range of motion. He exhibits no edema.  Lymphadenopathy:    He has no cervical adenopathy.  Neurological: He is alert and oriented to person, place, and time. No cranial nerve deficit. Coordination normal.  Skin: Skin is warm and dry. No rash noted. He is not diaphoretic. No erythema. No pallor.  Psychiatric: He has a normal mood and affect. His behavior is normal. Judgment and thought content normal.          Assessment & Plan:

## 2012-01-23 DIAGNOSIS — L989 Disorder of the skin and subcutaneous tissue, unspecified: Secondary | ICD-10-CM | POA: Diagnosis not present

## 2012-01-23 DIAGNOSIS — D485 Neoplasm of uncertain behavior of skin: Secondary | ICD-10-CM | POA: Diagnosis not present

## 2012-01-23 DIAGNOSIS — Z85828 Personal history of other malignant neoplasm of skin: Secondary | ICD-10-CM | POA: Diagnosis not present

## 2012-01-27 DIAGNOSIS — M999 Biomechanical lesion, unspecified: Secondary | ICD-10-CM | POA: Diagnosis not present

## 2012-01-27 DIAGNOSIS — M5137 Other intervertebral disc degeneration, lumbosacral region: Secondary | ICD-10-CM | POA: Diagnosis not present

## 2012-01-27 DIAGNOSIS — IMO0002 Reserved for concepts with insufficient information to code with codable children: Secondary | ICD-10-CM | POA: Diagnosis not present

## 2012-02-27 DIAGNOSIS — C44221 Squamous cell carcinoma of skin of unspecified ear and external auricular canal: Secondary | ICD-10-CM | POA: Diagnosis not present

## 2012-03-02 DIAGNOSIS — M5137 Other intervertebral disc degeneration, lumbosacral region: Secondary | ICD-10-CM | POA: Diagnosis not present

## 2012-03-02 DIAGNOSIS — M999 Biomechanical lesion, unspecified: Secondary | ICD-10-CM | POA: Diagnosis not present

## 2012-03-02 DIAGNOSIS — IMO0002 Reserved for concepts with insufficient information to code with codable children: Secondary | ICD-10-CM | POA: Diagnosis not present

## 2012-03-03 ENCOUNTER — Other Ambulatory Visit: Payer: Self-pay | Admitting: Internal Medicine

## 2012-03-09 DIAGNOSIS — C61 Malignant neoplasm of prostate: Secondary | ICD-10-CM | POA: Diagnosis not present

## 2012-03-09 DIAGNOSIS — R339 Retention of urine, unspecified: Secondary | ICD-10-CM | POA: Diagnosis not present

## 2012-03-09 DIAGNOSIS — N139 Obstructive and reflux uropathy, unspecified: Secondary | ICD-10-CM | POA: Diagnosis not present

## 2012-03-10 DIAGNOSIS — R609 Edema, unspecified: Secondary | ICD-10-CM | POA: Diagnosis not present

## 2012-03-10 DIAGNOSIS — N182 Chronic kidney disease, stage 2 (mild): Secondary | ICD-10-CM | POA: Diagnosis not present

## 2012-03-10 DIAGNOSIS — C61 Malignant neoplasm of prostate: Secondary | ICD-10-CM | POA: Diagnosis not present

## 2012-04-15 ENCOUNTER — Ambulatory Visit (INDEPENDENT_AMBULATORY_CARE_PROVIDER_SITE_OTHER): Payer: Medicare Other | Admitting: Cardiovascular Disease

## 2012-04-15 ENCOUNTER — Telehealth: Payer: Self-pay

## 2012-04-15 ENCOUNTER — Encounter: Payer: Self-pay | Admitting: Cardiovascular Disease

## 2012-04-15 VITALS — BP 112/70 | HR 68 | Ht 76.0 in | Wt 232.5 lb

## 2012-04-15 DIAGNOSIS — R0602 Shortness of breath: Secondary | ICD-10-CM

## 2012-04-15 DIAGNOSIS — I4891 Unspecified atrial fibrillation: Secondary | ICD-10-CM

## 2012-04-15 NOTE — Patient Instructions (Signed)
Your physician wants you to follow-up in: 1 month with Dr. Mariah Milling. You will receive a reminder letter in the mail two months in advance. If you don't receive a letter, please call our office to schedule the follow-up appointment.  Your physician has recommended you make the following change in your medication:  1) stop coumadin/warfarin 2) start Xarelto 20 mg daily 2 days after stopping coumadin/warfarin  Your physician has requested that you have an echocardiogram. Echocardiography is a painless test that uses sound waves to create images of your heart. It provides your doctor with information about the size and shape of your heart and how well your heart's chambers and valves are working. This procedure takes approximately one hour. There are no restrictions for this procedure.

## 2012-04-15 NOTE — Progress Notes (Signed)
William Taylor Date of Birth  Mar 23, 1930       [facility]    [place] [address]  [address] [place], [place]  [address]   [place], [place]  [address] [phone]     [phone]   Fax  [phone]    Fax [phone]  Problem List: 1. Coronary artery disease-status post stenting in 2005 and again  12/25/2010 2. Pulmonary was 3. Paroxysmal atrial fibrillation 4. Hyperlipidemia 5. Hypertension  History of Present Illness: William Taylor is a very pleasant 78 year old gentleman with a history of coronary artery disease, stent in 2005, repeat catheterization in 2009 showing patent stent with no other significant disease, normal systolic function in September 2011 who presented to the emergency room September 2011 with malaise, atrial fibrillation with RVR in the setting of a urinary tract infection, recent admission to the hospital November 14 2010 for atrial fibrillation that developed after pushing mowing with some chest pain. He converted back to normal sinus rhythm, Presenting to [facility] with chest discomfort with cardiac catheterization Dec 25 2010, showing 90% OM 2 disease, transferred to [facility] with DES stent placed, 2.25 x 16 mm POMUS stent, with flank pain developing after discharge, admitted to [facility] and diagnosed with pulmonary embolism with heavy clot burden.   April 15, 2012 He presents today with palpitations and thought that he was in A-fib. He thinks he has  Been in f-fib for about 2 weeks.   He thought his HR was rapid.  He denies any fever or chills recently.  He has had some constipitation.  He has had some mild dyspnea with climbing stairs but overall his doesn't have any severe symptoms.     Current Outpatient Prescriptions on File Prior to Visit  Medication Sig Dispense Refill  . aspirin 81 MG EC tablet Take 81 mg by mouth daily.        . clopidogrel (PLAVIX) 75 MG tablet Take 1 tablet (75 mg total) by mouth daily.   90 tablet  3  . diltiazem (CARDIZEM CD) 240 MG 24 hr capsule Take 1 capsule (240 mg total) by mouth daily.  90 capsule  3  . fenofibrate micronized (ANTARA) 130 MG capsule Take 1 capsule (130 mg total) by mouth daily before breakfast.  90 capsule  1  . finasteride (PROSCAR) 5 MG tablet Take 5 mg by mouth daily.        Marland Kitchen levobunolol (BETAGAN) 0.5 % ophthalmic solution 1 drop 2 (two) times daily.       . metoprolol tartrate (LOPRESSOR) 25 MG tablet Take 25 mg by mouth 2 (two) times daily.      Marland Kitchen NEXIUM 40 MG capsule TAKE 1 CAPSULE DAILY  90 capsule  3  . nitroGLYCERIN (NITROSTAT) 0.4 MG SL tablet Place 1 tablet (0.4 mg total) under the tongue every 5 (five) minutes as needed for chest pain.  25 tablet  3  . Omega-3 Fatty Acids (FISH OIL) 1000 MG CAPS Take 1,000 mg by mouth 2 (two) times daily.       . pravastatin (PRAVACHOL) 40 MG tablet TAKE 1 TABLET AT BEDTIME  90 tablet  2  . Tamsulosin HCl (FLOMAX) 0.4 MG CAPS Take 0.4 mg by mouth daily.      Marland Kitchen VITAMIN D, CHOLECALCIFEROL, PO Take by mouth.      . warfarin (COUMADIN) 2 MG tablet Take 1 tablet (2 mg total) by mouth daily.  90 tablet  3  No Known Allergies  Past Medical History  Diagnosis Date  . Coronary artery disease   . Hypertension   . Hyperlipidemia   . Arrhythmia     A-Fib.    Past Surgical History  Procedure Date  . Coronary angioplasty 2009    2005; s/p stent  . Cataract extraction Oct. 3, 2012    right eye  . Hernia repair 2013    History  Smoking status  . Never Smoker   Smokeless tobacco  . Never Used    History  Alcohol Use No    Family History  Problem Relation Age of Onset  . Stroke Mother   . Colon cancer Father     Reviw of Systems:  Reviewed in the HPI.  All other systems are negative.  Physical Exam: Blood pressure 112/70, pulse 68, height 6\' 4"  (1.93 m), weight 232 lb 8 oz (105.461 kg). General: Well developed, well nourished, in no acute distress.  Head: Normocephalic, atraumatic,  sclera non-icteric, mucus membranes are moist,   Neck: Supple. Carotids are 2 + without bruits. No JVD  Lungs: Clear bilaterally to auscultation.  Heart: irregularly irregular.  normal  S1 S2. Soft systolic murmur  Abdomen: Soft, non-tender, non-distended with normal bowel sounds. No hepatomegaly. No rebound/guarding. No masses.  Msk:  Strength and tone are normal  Extremities: No clubbing or cyanosis. No edema.  Distal pedal pulses are 2+ and equal bilaterally.  Neuro: Alert and oriented X 3. Moves all extremities spontaneously.  Psych:  Responds to questions appropriately with a normal affect.  ECG: April 15, 2012 - Atrial fibrillation at 27 .  NS ST/T abnormalities  Assessment / Plan:

## 2012-04-15 NOTE — Telephone Encounter (Signed)
Pt wife called in to say pt not feeling well, feels he is in atrial fib with a fast rate.  He is a little sob and was told by Dr. Mariah Milling to call us and make appt if this happens again. I explained Dr. Mariah Milling on vacation and is not in office this week.  Pt is ok with seeing Dr. Elease Hashimoto today instead. Pt coming in at 1100 this am.

## 2012-04-15 NOTE — Assessment & Plan Note (Signed)
[name] presents with atrial fibrillation with a controlled ventricular response. He is on diltiazem and metoprolol.  Is only on low-dose warfarin because of his side effects with fullness warfarin.  Red long discussion about carotids at this point. We discussed the possibility of rate control and chronic anticoagulation. I would like to try Xarelto instead of coumadin.  He's not very symptomatic it may be possible for [place] to maintain rate control with chronic anti-coagulation.  He has a diagnosis of paroxysmal atrial fibrillation so don't think that performaing a cardioversion alone will be a permanent solution.  We discussed various antiarrhythmics. He's not a candidate for flecainide or Rythmol because he has a history of coronary artery disease.  We talked about amiodarone therapy and discussed the various side effects related to amiodarone therapy. We also discussed Tikosyn therapy and the fact that  he would have to go to the hospital for 3 days while getting started on that medication.  For now, our best option is to start Xarelto 20 a day.  He will start the  Xarelto after holding his coumadin for 2 days.   We will get an echocardiogram for further evaluation of his cardiac structure.  I will have him see Dr Mariah Milling in 1 month to discuss the options.

## 2012-04-27 ENCOUNTER — Other Ambulatory Visit: Payer: Self-pay

## 2012-04-27 DIAGNOSIS — I10 Essential (primary) hypertension: Secondary | ICD-10-CM

## 2012-04-27 DIAGNOSIS — I25810 Atherosclerosis of coronary artery bypass graft(s) without angina pectoris: Secondary | ICD-10-CM

## 2012-04-28 ENCOUNTER — Other Ambulatory Visit: Payer: Self-pay

## 2012-04-28 ENCOUNTER — Other Ambulatory Visit (INDEPENDENT_AMBULATORY_CARE_PROVIDER_SITE_OTHER): Payer: Medicare Other

## 2012-04-28 DIAGNOSIS — I4891 Unspecified atrial fibrillation: Secondary | ICD-10-CM | POA: Diagnosis not present

## 2012-04-28 DIAGNOSIS — I10 Essential (primary) hypertension: Secondary | ICD-10-CM

## 2012-04-28 DIAGNOSIS — R0609 Other forms of dyspnea: Secondary | ICD-10-CM

## 2012-04-28 DIAGNOSIS — R011 Cardiac murmur, unspecified: Secondary | ICD-10-CM

## 2012-04-28 DIAGNOSIS — I25810 Atherosclerosis of coronary artery bypass graft(s) without angina pectoris: Secondary | ICD-10-CM

## 2012-04-28 DIAGNOSIS — R0989 Other specified symptoms and signs involving the circulatory and respiratory systems: Secondary | ICD-10-CM

## 2012-04-28 MED ORDER — RIVAROXABAN 20 MG PO TABS: 20.0000 mg | ORAL_TABLET | Freq: Every day | ORAL | Status: DC

## 2012-04-29 DIAGNOSIS — IMO0002 Reserved for concepts with insufficient information to code with codable children: Secondary | ICD-10-CM | POA: Diagnosis not present

## 2012-04-29 DIAGNOSIS — M999 Biomechanical lesion, unspecified: Secondary | ICD-10-CM | POA: Diagnosis not present

## 2012-04-29 DIAGNOSIS — M5137 Other intervertebral disc degeneration, lumbosacral region: Secondary | ICD-10-CM | POA: Diagnosis not present

## 2012-04-30 ENCOUNTER — Telehealth: Payer: Self-pay | Admitting: Cardiovascular Disease

## 2012-04-30 NOTE — Telephone Encounter (Signed)
See below

## 2012-04-30 NOTE — Telephone Encounter (Signed)
[facility] calling to ask questions about Zorelto tabs 20mg  that the pt is on.  Ref # [id number]

## 2012-05-01 ENCOUNTER — Telehealth: Payer: Self-pay | Admitting: Cardiovascular Disease

## 2012-05-01 NOTE — Telephone Encounter (Signed)
Hart Carwin from [facility] calling to ask if provider can call them back at [phone] with Ref # [id number]; has questions about drug therapy.  Please return call.

## 2012-05-01 NOTE — Telephone Encounter (Signed)
[facility] called needing pt's creatinine clearance. Manufacturer of Xarelto suggests creat clearance >50 for a pt to safely be taking 20 mg qd.   [facility] rep calculated creat clearance while I was on phone. I gave her pt's most recent creatinine of 1.1 in June 2013 and last weight. Creat clearance was calculated at 80 Rep says ok to go ahead and fill Xarelto 20 mg tablets. They will go ahead and process this request.

## 2012-05-01 NOTE — Telephone Encounter (Signed)
Pt updated phone # to be called for results the ECHO he had done 04-28-12.  Please call pt to inform.  Phone # [phone]

## 2012-05-12 ENCOUNTER — Ambulatory Visit (INDEPENDENT_AMBULATORY_CARE_PROVIDER_SITE_OTHER): Payer: Medicare Other | Admitting: Internal Medicine

## 2012-05-12 DIAGNOSIS — Z23 Encounter for immunization: Secondary | ICD-10-CM

## 2012-05-17 ENCOUNTER — Other Ambulatory Visit: Payer: Self-pay | Admitting: Internal Medicine

## 2012-05-18 ENCOUNTER — Ambulatory Visit (INDEPENDENT_AMBULATORY_CARE_PROVIDER_SITE_OTHER): Payer: Medicare Other | Admitting: Cardiovascular Disease

## 2012-05-18 ENCOUNTER — Encounter: Payer: Self-pay | Admitting: Cardiovascular Disease

## 2012-05-18 VITALS — BP 148/92 | HR 80 | Ht 76.0 in | Wt 228.2 lb

## 2012-05-18 DIAGNOSIS — R0602 Shortness of breath: Secondary | ICD-10-CM

## 2012-05-18 DIAGNOSIS — E785 Hyperlipidemia, unspecified: Secondary | ICD-10-CM | POA: Diagnosis not present

## 2012-05-18 DIAGNOSIS — I2510 Atherosclerotic heart disease of native coronary artery without angina pectoris: Secondary | ICD-10-CM

## 2012-05-18 DIAGNOSIS — I4891 Unspecified atrial fibrillation: Secondary | ICD-10-CM

## 2012-05-18 DIAGNOSIS — I10 Essential (primary) hypertension: Secondary | ICD-10-CM

## 2012-05-18 MED ORDER — AMIODARONE HCL 400 MG PO TABS: 400.0000 mg | ORAL_TABLET | Freq: Two times a day (BID) | ORAL | Status: DC

## 2012-05-18 NOTE — Progress Notes (Signed)
Patient ID: William Taylor, male    DOB: November 25, 1929, 78 y.o.   MRN: [id number]  HPI Comments: William Taylor is a very pleasant 78 year old gentleman with a history of coronary artery disease, stent in 2005, repeat catheterization in 2009 showing patent stent with no other significant disease, normal systolic function in September 2011 who presented to the emergency room September 2011 with malaise, atrial fibrillation with RVR in the setting of a urinary tract infection, recent admission to the hospital November 14 2010 for atrial fibrillation that developed after pushing mowing with some chest pain. He converted back to normal sinus rhythm, Presenting to [facility] with chest discomfort with cardiac catheterization Dec 25 2010, showing 90% OM 2 disease, transferred to [facility]  with DES stent placed, 2.25 x 16 mm POMUS stent, with flank pain developing after discharge, admitted to [facility] and diagnosed with pulmonary embolism with heavy clot burden.   On his last visit to the office one month ago, he was found to be in atrial fibrillation. He has symptoms of shortness of breath with exertion. He was started on xarelto. He also takes aspirin and Plavix. He reports that he continues to have symptoms of shortness of breath and does not feel as well as he normally does. He is interested in other treatment options. He denies any significant cough, PND or orthopnea and no lower exterminate edema   EKG shows atrial fibrillation with rate 80 beats per minute    Outpatient Encounter Prescriptions as of 01/20/2012  Medication Sig Dispense Refill  . aspirin 81 MG EC tablet Take 81 mg by mouth daily.        . clopidogrel (PLAVIX) 75 MG tablet Take 1 tablet (75 mg total) by mouth daily.  90 tablet  3  . diltiazem (CARDIZEM CD) 240 MG 24 hr capsule Take 1 capsule (240 mg total) by mouth daily.  90 capsule  3  . fenofibrate micronized (ANTARA) 130 MG capsule Take 1 capsule (130 mg total) by mouth daily before breakfast.  90  capsule  1  . finasteride (PROSCAR) 5 MG tablet Take 5 mg by mouth daily.        Marland Kitchen levobunolol (BETAGAN) 0.5 % ophthalmic solution 1 drop 2 (two) times daily.       . metoprolol tartrate (LOPRESSOR) 25 MG tablet Take 25 mg by mouth 2 (two) times daily.      Marland Kitchen NEXIUM 40 MG capsule TAKE 1 CAPSULE DAILY  90 capsule  3  . Omega-3 Fatty Acids (FISH OIL) 1000 MG CAPS Take 1,000 mg by mouth 2 (two) times daily.       . pravastatin (PRAVACHOL) 40 MG tablet TAKE 1 TABLET AT BEDTIME  90 tablet  2  . Tamsulosin HCl (FLOMAX) 0.4 MG CAPS Take 0.4 mg by mouth daily.      Marland Kitchen VITAMIN D, CHOLECALCIFEROL, PO Take by mouth.      . warfarin (COUMADIN) 10 MG tablet Take 10 mg by mouth daily. Take 10mg  Monday Wednesday and Friday. Takes 1/2 tablet Tuesday, Thursday, Saturday and Sunday.      . warfarin (COUMADIN) 5 MG tablet Take 5 mg by mouth daily. Takes 5 mg Monday, Wednesday and Friday and 1/2 tablet Tuesday, Thursday, Saturday and Sunday.      . nitroGLYCERIN (NITROSTAT) 0.4 MG SL tablet Place 1 tablet (0.4 mg total) under the tongue every 5 (five) minutes as needed for chest pain.  25 tablet  3    Review of Systems  Constitutional: Negative.   HENT: Negative.   Eyes: Negative.   Respiratory: Negative.   Cardiovascular: Negative.   Gastrointestinal: Negative.   Musculoskeletal: Negative.   Skin: Negative.   Neurological: Negative.   Hematological: Negative.   Psychiatric/Behavioral: Negative.   All other systems reviewed and are negative.    BP 148/92  Pulse 80  Ht 6\' 4"  (1.93 m)  Wt 228 lb 4 oz (103.534 kg)  BMI 27.78 kg/m2  Physical Exam  Nursing note and vitals reviewed. Constitutional: He is oriented to person, place, and time. He appears well-developed and well-nourished.  HENT:  Head: Normocephalic.  Nose: Nose normal.  Mouth/Throat: Oropharynx is clear and moist.  Eyes: Conjunctivae normal are normal. Pupils are equal, round, and reactive to light.  Neck: Normal range of motion. Neck  supple. No JVD present.  Cardiovascular: Normal rate, S1 normal, S2 normal, normal heart sounds and intact distal pulses.  An irregularly irregular rhythm present. Exam reveals no gallop and no friction rub.   No murmur heard. Pulmonary/Chest: Effort normal and breath sounds normal. No respiratory distress. He has no wheezes. He has no rales. He exhibits no tenderness.  Abdominal: Soft. Bowel sounds are normal. He exhibits no distension. There is no tenderness.  Musculoskeletal: Normal range of motion. He exhibits no edema and no tenderness.  Lymphadenopathy:    He has no cervical adenopathy.  Neurological: He is alert and oriented to person, place, and time. Coordination normal.  Skin: Skin is warm and dry. No rash noted. No erythema.  Psychiatric: He has a normal mood and affect. His behavior is normal. Judgment and thought content normal.           Assessment and Plan

## 2012-05-18 NOTE — Assessment & Plan Note (Signed)
Reason shortness of breath is likely secondary to underlying arrhythmia/atrial fibrillation

## 2012-05-18 NOTE — Assessment & Plan Note (Signed)
We'll start him on amiodarone 400 mg twice a day for 5 days then down to 200 mg twice a day. Recheck EKG in 2 weeks' time. If he has not converted to normal sinus rhythm we will schedule a cardioversion. Stay on anticoagulation.

## 2012-05-18 NOTE — Assessment & Plan Note (Signed)
Cholesterol is at goal on the current lipid regimen. No changes to the medications were made.  

## 2012-05-18 NOTE — Assessment & Plan Note (Signed)
Blood pressure is well controlled on today's visit. No changes made to the medications. 

## 2012-05-18 NOTE — Patient Instructions (Signed)
Please stop the aspirin Please start amiodarone 400 mg twice a day for 5 days  Then decrease the dose to 200 mg twice a day Come in to the office for an ekg in 10 to 14 days If you are not in normal rhythm, we will schedule a cardioversion  Please call us if you have new issues that need to be addressed before your next appt.  Your physician wants you to follow-up in: 1 months.

## 2012-05-18 NOTE — Assessment & Plan Note (Signed)
We have suggested he hold his aspirin, stay on Plavix and xarelto. Currently with no symptoms of angina. No further workup at this time. Continue current medication regimen.

## 2012-05-25 DIAGNOSIS — M999 Biomechanical lesion, unspecified: Secondary | ICD-10-CM | POA: Diagnosis not present

## 2012-05-25 DIAGNOSIS — IMO0002 Reserved for concepts with insufficient information to code with codable children: Secondary | ICD-10-CM | POA: Diagnosis not present

## 2012-05-25 DIAGNOSIS — M5137 Other intervertebral disc degeneration, lumbosacral region: Secondary | ICD-10-CM | POA: Diagnosis not present

## 2012-06-01 ENCOUNTER — Ambulatory Visit (INDEPENDENT_AMBULATORY_CARE_PROVIDER_SITE_OTHER): Payer: Medicare Other

## 2012-06-01 VITALS — BP 130/70 | HR 58 | Ht 76.0 in | Wt 231.0 lb

## 2012-06-01 DIAGNOSIS — I4891 Unspecified atrial fibrillation: Secondary | ICD-10-CM | POA: Diagnosis not present

## 2012-06-01 MED ORDER — AMIODARONE HCL 400 MG PO TABS: 200.0000 mg | ORAL_TABLET | Freq: Two times a day (BID) | ORAL | Status: DC

## 2012-06-01 NOTE — Patient Instructions (Addendum)
Your physician wants you to follow-up in: December if needed, otherwise, may push out further. You will receive a reminder letter in the mail two months in advance. If you don't receive a letter, please call our office to schedule the follow-up appointment.

## 2012-06-01 NOTE — Progress Notes (Signed)
Pt here for EKG after amiodarone load/start EKG shows pt to be in NSR with HR=58 BPM Pt reports sob has improved and he is feeling well on amiodarone 200 mg BID  Dr. Mariah Milling reviewed EKG and is in to see patient He gave orders to "resume amiodarone. Decrease dose to 200 mg PO QD. Stay on all other meds including Xarelto. F/u in December if he feels he needs to be seen, otherwise, may push out to later date". V.O Dr. Alvis Lemmings, RN  Pt informed. Understanding verb.

## 2012-06-11 DIAGNOSIS — H4010X0 Unspecified open-angle glaucoma, stage unspecified: Secondary | ICD-10-CM | POA: Diagnosis not present

## 2012-06-12 ENCOUNTER — Other Ambulatory Visit: Payer: Self-pay | Admitting: Internal Medicine

## 2012-06-17 NOTE — Addendum Note (Signed)
Addended by: Antonieta Iba on: 06/17/2012 12:30 AM   Modules accepted: Level of Service

## 2012-06-21 ENCOUNTER — Other Ambulatory Visit: Payer: Self-pay | Admitting: Internal Medicine

## 2012-06-22 ENCOUNTER — Ambulatory Visit: Payer: Medicare Other | Admitting: Cardiovascular Disease

## 2012-06-29 DIAGNOSIS — M5137 Other intervertebral disc degeneration, lumbosacral region: Secondary | ICD-10-CM | POA: Diagnosis not present

## 2012-06-29 DIAGNOSIS — IMO0002 Reserved for concepts with insufficient information to code with codable children: Secondary | ICD-10-CM | POA: Diagnosis not present

## 2012-06-29 DIAGNOSIS — M999 Biomechanical lesion, unspecified: Secondary | ICD-10-CM | POA: Diagnosis not present

## 2012-07-06 ENCOUNTER — Encounter: Payer: Self-pay | Admitting: Cardiovascular Disease

## 2012-07-20 ENCOUNTER — Ambulatory Visit (INDEPENDENT_AMBULATORY_CARE_PROVIDER_SITE_OTHER): Payer: Medicare Other | Admitting: Cardiovascular Disease

## 2012-07-20 ENCOUNTER — Encounter: Payer: Self-pay | Admitting: Cardiovascular Disease

## 2012-07-20 ENCOUNTER — Other Ambulatory Visit: Payer: Self-pay | Admitting: *Deleted

## 2012-07-20 VITALS — BP 140/82 | HR 58 | Ht 76.0 in | Wt 231.5 lb

## 2012-07-20 DIAGNOSIS — I4891 Unspecified atrial fibrillation: Secondary | ICD-10-CM | POA: Diagnosis not present

## 2012-07-20 DIAGNOSIS — R0602 Shortness of breath: Secondary | ICD-10-CM

## 2012-07-20 DIAGNOSIS — I2510 Atherosclerotic heart disease of native coronary artery without angina pectoris: Secondary | ICD-10-CM | POA: Diagnosis not present

## 2012-07-20 DIAGNOSIS — E785 Hyperlipidemia, unspecified: Secondary | ICD-10-CM

## 2012-07-20 MED ORDER — AMIODARONE HCL 200 MG PO TABS: 200.0000 mg | ORAL_TABLET | Freq: Every day | ORAL | Status: DC

## 2012-07-20 NOTE — Assessment & Plan Note (Addendum)
Currently with no symptoms of angina. No further workup at this time. Continue current medication regimen. Continue aspirin and xarelto.

## 2012-07-20 NOTE — Assessment & Plan Note (Signed)
Maintaining normal sinus rhythm. We have discussed various treatment options. We will continue his current medications.

## 2012-07-20 NOTE — Patient Instructions (Addendum)
You are doing well. No medication changes were made.  Please call us if you have new issues that need to be addressed before your next appt.  Your physician wants you to follow-up in: 6 months.  You will receive a reminder letter in the mail two months in advance. If you don't receive a letter, please call our office to schedule the follow-up appointment.   

## 2012-07-20 NOTE — Telephone Encounter (Signed)
Refilled Amiodarone. 

## 2012-07-20 NOTE — Progress Notes (Signed)
Patient ID: William Taylor, male    DOB: 15-Oct-1929, 78 y.o.   MRN: [id number]  HPI Comments: William Taylor is a very pleasant 78 year old gentleman with a history of coronary artery disease, stent in 2005, repeat catheterization in 2009 showing patent stent with no other significant disease, normal systolic function in September 2011 who presented to the emergency room September 2011 with malaise, atrial fibrillation with RVR in the setting of a urinary tract infection, recent admission to the hospital November 14 2010 for atrial fibrillation that developed after pushing mowing with some chest pain. He converted back to normal sinus rhythm, Presenting to [facility] with chest discomfort with cardiac catheterization Dec 25 2010, showing 90% OM 2 disease, transferred to [facility]  with DES stent placed, 2.25 x 16 mm POMUS stent, with flank pain developing after discharge, admitted to [facility] and diagnosed with pulmonary embolism with heavy clot burden.    he was found to be in atrial fibrillation several months ago. He has symptoms of shortness of breath with exertion. He was started on xarelto. He also takes aspirin and Plavix.  He denied any significant cough, PND or orthopnea and no lower exterminate edema.  After one month on xarelto, amiodarone load and titration downward was started with conversion to NSR in October.  EKG the end of October confirmed normal sinus rhythm. Today he reports that he feels well with no complaints. He checks his pulse periodically and reports it has been regular.   EKG shows normal sinus rhythm with rate 50 per minute, APCs, no significant ST or T wave changes   Outpatient Encounter Prescriptions as of 07/20/2012  Medication Sig Dispense Refill  . amiodarone (PACERONE) 200 MG tablet Take 1 tablet (200 mg total) by mouth daily.  90 tablet  3  . clopidogrel (PLAVIX) 75 MG tablet Take 1 tablet (75 mg total) by mouth daily.  90 tablet  3  . diltiazem (CARDIZEM CD) 240 MG 24 hr  capsule Take 1 capsule (240 mg total) by mouth daily.  90 capsule  3  . fenofibrate micronized (ANTARA) 130 MG capsule TAKE 1 CAPSULE DAILY BEFORE BREAKFAST  90 capsule  0  . finasteride (PROSCAR) 5 MG tablet Take 5 mg by mouth daily.        Marland Kitchen KRILL OIL PO Take by mouth 2 (two) times daily.      Marland Kitchen levobunolol (BETAGAN) 0.5 % ophthalmic solution 1 drop 2 (two) times daily.       . metoprolol (LOPRESSOR) 50 MG tablet TAKE ONE-HALF (1/2) TABLET (25 MG TOTAL) TWICE A DAY  90 tablet  1  . NEXIUM 40 MG capsule TAKE 1 CAPSULE DAILY  90 capsule  3  . nitroGLYCERIN (NITROSTAT) 0.4 MG SL tablet Place 1 tablet (0.4 mg total) under the tongue every 5 (five) minutes as needed for chest pain.  25 tablet  3  . pravastatin (PRAVACHOL) 40 MG tablet TAKE 1 TABLET AT BEDTIME  90 tablet  1  . Rivaroxaban (XARELTO) 20 MG TABS Take 1 tablet (20 mg total) by mouth daily.  90 tablet  3  . Tamsulosin HCl (FLOMAX) 0.4 MG CAPS Take 0.4 mg by mouth daily.      Marland Kitchen VITAMIN D, CHOLECALCIFEROL, PO Take by mouth.        Review of Systems  Constitutional: Negative.   HENT: Negative.   Eyes: Negative.   Respiratory: Negative.   Cardiovascular: Negative.   Gastrointestinal: Negative.   Musculoskeletal: Negative.   Skin:  Negative.   Neurological: Negative.   Hematological: Negative.   Psychiatric/Behavioral: Negative.   All other systems reviewed and are negative.    BP 140/82  Pulse 58  Ht 6\' 4"  (1.93 m)  Wt 231 lb 8 oz (105.008 kg)  BMI 28.18 kg/m2  Physical Exam  Nursing note and vitals reviewed. Constitutional: He is oriented to person, place, and time. He appears well-developed and well-nourished.  HENT:  Head: Normocephalic.  Nose: Nose normal.  Mouth/Throat: Oropharynx is clear and moist.  Eyes: Conjunctivae normal are normal. Pupils are equal, round, and reactive to light.  Neck: Normal range of motion. Neck supple. No JVD present.  Cardiovascular: Normal rate, S1 normal, S2 normal, normal heart  sounds and intact distal pulses.  An irregularly irregular rhythm present. Exam reveals no gallop and no friction rub.   No murmur heard. Pulmonary/Chest: Effort normal and breath sounds normal. No respiratory distress. He has no wheezes. He has no rales. He exhibits no tenderness.  Abdominal: Soft. Bowel sounds are normal. He exhibits no distension. There is no tenderness.  Musculoskeletal: Normal range of motion. He exhibits no edema and no tenderness.  Lymphadenopathy:    He has no cervical adenopathy.  Neurological: He is alert and oriented to person, place, and time. Coordination normal.  Skin: Skin is warm and dry. No rash noted. No erythema.  Psychiatric: He has a normal mood and affect. His behavior is normal. Judgment and thought content normal.           Assessment and Plan

## 2012-07-20 NOTE — Assessment & Plan Note (Signed)
Continue on his statin, goal LDL less than 70. 

## 2012-07-23 ENCOUNTER — Encounter: Payer: Self-pay | Admitting: Internal Medicine

## 2012-07-23 ENCOUNTER — Ambulatory Visit (INDEPENDENT_AMBULATORY_CARE_PROVIDER_SITE_OTHER): Payer: Medicare Other | Admitting: Internal Medicine

## 2012-07-23 VITALS — BP 130/76 | HR 80 | Temp 97.6°F | Resp 16 | Wt 227.5 lb

## 2012-07-23 DIAGNOSIS — R739 Hyperglycemia, unspecified: Secondary | ICD-10-CM | POA: Insufficient documentation

## 2012-07-23 DIAGNOSIS — R7309 Other abnormal glucose: Secondary | ICD-10-CM

## 2012-07-23 DIAGNOSIS — I4891 Unspecified atrial fibrillation: Secondary | ICD-10-CM

## 2012-07-23 DIAGNOSIS — N183 Chronic kidney disease, stage 3 unspecified: Secondary | ICD-10-CM | POA: Diagnosis not present

## 2012-07-23 DIAGNOSIS — I10 Essential (primary) hypertension: Secondary | ICD-10-CM | POA: Diagnosis not present

## 2012-07-23 LAB — COMPREHENSIVE METABOLIC PANEL
Albumin: 4 g/dL (ref 3.5–5.2)
Alkaline Phosphatase: 31 U/L — ABNORMAL LOW (ref 39–117)
CO2: 29 mEq/L (ref 19–32)
Glucose, Bld: 126 mg/dL — ABNORMAL HIGH (ref 70–99)
Potassium: 4.2 mEq/L (ref 3.5–5.1)
Sodium: 139 mEq/L (ref 135–145)
Total Protein: 7.1 g/dL (ref 6.0–8.3)

## 2012-07-23 NOTE — Assessment & Plan Note (Addendum)
Renal function stable on labs today. Follow up 6 months and prn.

## 2012-07-23 NOTE — Assessment & Plan Note (Signed)
BP well controlled on current medications. Will check renal function with labs today. Follow up 6 months.

## 2012-07-23 NOTE — Assessment & Plan Note (Signed)
NSR on exam today. Continue metoprolol and diltiazem. Continue Xarelto for anticoagulation. Follow up 6 months and prn.

## 2012-07-23 NOTE — Assessment & Plan Note (Signed)
Fasting sugar of 126 noted today on labs. We'll add A1c to labs. Encourage limited intake of sugar/processed carbohydrates.

## 2012-07-23 NOTE — Progress Notes (Signed)
Subjective:    Patient ID: William Taylor, male    DOB: February 13, 1930, 78 y.o.   MRN: [id number]  HPI 78 year old male with history atrial fibrillation, hypertension, pulmonary embolism on chronic anticoagulation, chronic kidney disease presents for followup. He reports he is generally feeling well. He denies any chest pain, palpitations, shortness of breath. He reports full compliance with his medications. He denies any new concerns today.  Outpatient Encounter Prescriptions as of 07/23/2012  Medication Sig Dispense Refill  . amiodarone (PACERONE) 200 MG tablet Take 1 tablet (200 mg total) by mouth daily.  90 tablet  3  . clopidogrel (PLAVIX) 75 MG tablet Take 1 tablet (75 mg total) by mouth daily.  90 tablet  3  . diltiazem (CARDIZEM CD) 240 MG 24 hr capsule Take 1 capsule (240 mg total) by mouth daily.  90 capsule  3  . fenofibrate micronized (ANTARA) 130 MG capsule TAKE 1 CAPSULE DAILY BEFORE BREAKFAST  90 capsule  0  . finasteride (PROSCAR) 5 MG tablet Take 5 mg by mouth daily.        Marland Kitchen KRILL OIL PO Take by mouth 2 (two) times daily.      Marland Kitchen levobunolol (BETAGAN) 0.5 % ophthalmic solution 1 drop 2 (two) times daily.       . metoprolol (LOPRESSOR) 50 MG tablet TAKE ONE-HALF (1/2) TABLET (25 MG TOTAL) TWICE A DAY  90 tablet  1  . NEXIUM 40 MG capsule TAKE 1 CAPSULE DAILY  90 capsule  3  . nitroGLYCERIN (NITROSTAT) 0.4 MG SL tablet Place 1 tablet (0.4 mg total) under the tongue every 5 (five) minutes as needed for chest pain.  25 tablet  3  . pravastatin (PRAVACHOL) 40 MG tablet TAKE 1 TABLET AT BEDTIME  90 tablet  1  . Rivaroxaban (XARELTO) 20 MG TABS Take 1 tablet (20 mg total) by mouth daily.  90 tablet  3  . Tamsulosin HCl (FLOMAX) 0.4 MG CAPS Take 0.4 mg by mouth daily.      Marland Kitchen VITAMIN D, CHOLECALCIFEROL, PO Take by mouth.      . [DISCONTINUED] diltiazem (DILACOR XR) 240 MG 24 hr capsule        BP 130/76  Pulse 80  Temp 97.6 F (36.4 C) (Oral)  Resp 16  Wt 227 lb 8 oz (103.193  kg)  Review of Systems  Constitutional: Negative for fever, chills, activity change, appetite change, fatigue and unexpected weight change.  Eyes: Negative for visual disturbance.  Respiratory: Negative for cough and shortness of breath.   Cardiovascular: Negative for chest pain, palpitations and leg swelling.  Gastrointestinal: Negative for abdominal pain and abdominal distention.  Genitourinary: Negative for dysuria, urgency and difficulty urinating.  Musculoskeletal: Negative for arthralgias and gait problem.  Skin: Negative for color change and rash.  Hematological: Negative for adenopathy.  Psychiatric/Behavioral: Negative for sleep disturbance and dysphoric mood. The patient is not nervous/anxious.        Objective:   Physical Exam  Constitutional: He is oriented to person, place, and time. He appears well-developed and well-nourished. No distress.  HENT:  Head: Normocephalic and atraumatic.  Right Ear: External ear normal.  Left Ear: External ear normal.  Nose: Nose normal.  Mouth/Throat: Oropharynx is clear and moist. No oropharyngeal exudate.  Eyes: Conjunctivae normal and EOM are normal. Pupils are equal, round, and reactive to light. Right eye exhibits no discharge. Left eye exhibits no discharge. No scleral icterus.  Neck: Normal range of motion. Neck supple. No tracheal deviation  present. No thyromegaly present.  Cardiovascular: Normal rate, regular rhythm and normal heart sounds.  Exam reveals no gallop and no friction rub.   No murmur heard. Pulmonary/Chest: Effort normal and breath sounds normal. No respiratory distress. He has no wheezes. He has no rales. He exhibits no tenderness.  Musculoskeletal: Normal range of motion. He exhibits no edema.  Lymphadenopathy:    He has no cervical adenopathy.  Neurological: He is alert and oriented to person, place, and time. No cranial nerve deficit. Coordination normal.  Skin: Skin is warm and dry. No rash noted. He is not  diaphoretic. No erythema. No pallor.  Psychiatric: He has a normal mood and affect. His behavior is normal. Judgment and thought content normal.          Assessment & Plan:

## 2012-07-24 DIAGNOSIS — Z85828 Personal history of other malignant neoplasm of skin: Secondary | ICD-10-CM | POA: Diagnosis not present

## 2012-07-24 DIAGNOSIS — D235 Other benign neoplasm of skin of trunk: Secondary | ICD-10-CM | POA: Diagnosis not present

## 2012-07-24 DIAGNOSIS — L821 Other seborrheic keratosis: Secondary | ICD-10-CM | POA: Diagnosis not present

## 2012-07-27 DIAGNOSIS — IMO0002 Reserved for concepts with insufficient information to code with codable children: Secondary | ICD-10-CM | POA: Diagnosis not present

## 2012-07-27 DIAGNOSIS — M5137 Other intervertebral disc degeneration, lumbosacral region: Secondary | ICD-10-CM | POA: Diagnosis not present

## 2012-07-27 DIAGNOSIS — M999 Biomechanical lesion, unspecified: Secondary | ICD-10-CM | POA: Diagnosis not present

## 2012-07-28 ENCOUNTER — Other Ambulatory Visit: Payer: Self-pay

## 2012-07-28 MED ORDER — AMIODARONE HCL 200 MG PO TABS: 200.0000 mg | ORAL_TABLET | Freq: Every day | ORAL | Status: DC

## 2012-07-28 NOTE — Telephone Encounter (Signed)
Refill amiodarone

## 2012-07-29 ENCOUNTER — Encounter: Payer: Self-pay | Admitting: Internal Medicine

## 2012-07-29 ENCOUNTER — Ambulatory Visit (INDEPENDENT_AMBULATORY_CARE_PROVIDER_SITE_OTHER): Payer: Medicare Other | Admitting: Internal Medicine

## 2012-07-29 ENCOUNTER — Telehealth: Payer: Self-pay | Admitting: Internal Medicine

## 2012-07-29 VITALS — BP 140/84 | HR 58 | Temp 98.0°F | Resp 15 | Wt 233.5 lb

## 2012-07-29 DIAGNOSIS — R109 Unspecified abdominal pain: Secondary | ICD-10-CM

## 2012-07-29 DIAGNOSIS — R071 Chest pain on breathing: Secondary | ICD-10-CM | POA: Diagnosis not present

## 2012-07-29 DIAGNOSIS — R0789 Other chest pain: Secondary | ICD-10-CM | POA: Insufficient documentation

## 2012-07-29 LAB — POCT URINALYSIS DIPSTICK
Bilirubin, UA: NEGATIVE
Blood, UA: NEGATIVE
Glucose, UA: NEGATIVE
Leukocytes, UA: NEGATIVE
Nitrite, UA: NEGATIVE

## 2012-07-29 LAB — COMPREHENSIVE METABOLIC PANEL
Alkaline Phosphatase: 36 U/L — ABNORMAL LOW (ref 39–117)
Glucose, Bld: 131 mg/dL — ABNORMAL HIGH (ref 70–99)
Sodium: 140 mEq/L (ref 135–145)
Total Bilirubin: 0.6 mg/dL (ref 0.3–1.2)
Total Protein: 7.8 g/dL (ref 6.0–8.3)

## 2012-07-29 LAB — TROPONIN I: Troponin I: <0.01 ng/mL (ref <0.06)

## 2012-07-29 LAB — D-DIMER, QUANTITATIVE: D-Dimer, Quant: 0.27 ug/mL-FEU (ref 0.00–0.48)

## 2012-07-29 NOTE — Assessment & Plan Note (Addendum)
Left sided flank/chest wall pain. Pt denies dyspnea, however during exam intermittently appears dyspneic. Sats normal. Exam otherwise normal. EKG shows sinus bradycardia. D-dimer negative. Troponin negative. Urinalysis and CMP normal. Pt reports compliance with Xarelto, so theoretically risk of recurrent PE should be low. However, given severity of symptoms and pt recent history of pulmonary embolus, will repeat CT chest with contrast for further evaluation.

## 2012-07-29 NOTE — Telephone Encounter (Signed)
Put pt on Dr. Tilman Neat schedule for today

## 2012-07-29 NOTE — Assessment & Plan Note (Addendum)
Pt with left sided flank/chest wall pain. Exam is normal. Urinalysis today normal. Suspect muscular strain, however given h/o PE, will get CT chest with contrast for further evaluation.

## 2012-07-29 NOTE — Progress Notes (Signed)
Subjective:    Patient ID: William Taylor, male    DOB: 12/28/1929, 78 y.o.   MRN: [id number]  HPI 78YO male with h/o atrial fibrillation and pulmonary embolus on chronic anticoagulation with Xarelto presents for acute visit complaining of left sided lateral and posterior chest wall pain x 5 days. Denies any trauma to his chest. Denies any new physical activities or strain. Symptoms began suddenly on Saturday. Denies anterior chest pain, diaphoresis, nausea, dyspnea. Denies fever, chills, cough, change in urinary or bowel habits, hematuria.  Pain is persistent at rest and on exertion. Not changed with deep inspiration. Has not taken any medication for pain.  Outpatient Encounter Prescriptions as of 07/29/2012  Medication Sig Dispense Refill  . amiodarone (PACERONE) 200 MG tablet Take 1 tablet (200 mg total) by mouth daily.  90 tablet  3  . clopidogrel (PLAVIX) 75 MG tablet Take 1 tablet (75 mg total) by mouth daily.  90 tablet  3  . diltiazem (CARDIZEM CD) 240 MG 24 hr capsule Take 1 capsule (240 mg total) by mouth daily.  90 capsule  3  . fenofibrate micronized (ANTARA) 130 MG capsule TAKE 1 CAPSULE DAILY BEFORE BREAKFAST  90 capsule  0  . finasteride (PROSCAR) 5 MG tablet Take 5 mg by mouth daily.        Marland Kitchen KRILL OIL PO Take by mouth 2 (two) times daily.      Marland Kitchen levobunolol (BETAGAN) 0.5 % ophthalmic solution 1 drop 2 (two) times daily.       . metoprolol (LOPRESSOR) 50 MG tablet TAKE ONE-HALF (1/2) TABLET (25 MG TOTAL) TWICE A DAY  90 tablet  1  . NEXIUM 40 MG capsule TAKE 1 CAPSULE DAILY  90 capsule  3  . nitroGLYCERIN (NITROSTAT) 0.4 MG SL tablet Place 1 tablet (0.4 mg total) under the tongue every 5 (five) minutes as needed for chest pain.  25 tablet  3  . pravastatin (PRAVACHOL) 40 MG tablet TAKE 1 TABLET AT BEDTIME  90 tablet  1  . Rivaroxaban (XARELTO) 20 MG TABS Take 1 tablet (20 mg total) by mouth daily.  90 tablet  3  . Tamsulosin HCl (FLOMAX) 0.4 MG CAPS Take 0.4 mg by mouth daily.       Marland Kitchen VITAMIN D, CHOLECALCIFEROL, PO Take by mouth.      . [DISCONTINUED] diltiazem (DILACOR XR) 240 MG 24 hr capsule        BP 140/84  Pulse 58  Temp 98 F (36.7 C) (Oral)  Resp 15  Wt 233 lb 8 oz (105.915 kg)  SpO2 97%  Review of Systems  Constitutional: Negative for fever, chills, activity change, appetite change, fatigue and unexpected weight change.  Eyes: Negative for visual disturbance.  Respiratory: Negative for cough, chest tightness, shortness of breath and wheezing.   Cardiovascular: Positive for chest pain. Negative for palpitations and leg swelling.  Gastrointestinal: Negative for abdominal pain and abdominal distention.  Genitourinary: Positive for flank pain. Negative for dysuria, urgency, frequency, hematuria and difficulty urinating.  Musculoskeletal: Positive for back pain. Negative for arthralgias and gait problem.  Skin: Negative for color change and rash.  Hematological: Negative for adenopathy.  Psychiatric/Behavioral: Negative for sleep disturbance and dysphoric mood. The patient is not nervous/anxious.        Objective:   Physical Exam  Constitutional: He is oriented to person, place, and time. He appears well-developed and well-nourished. No distress.  HENT:  Head: Normocephalic and atraumatic.  Right Ear: External ear normal.  Left Ear: External ear normal.  Nose: Nose normal.  Mouth/Throat: Oropharynx is clear and moist. No oropharyngeal exudate.  Eyes: Conjunctivae normal and EOM are normal. Pupils are equal, round, and reactive to light. Right eye exhibits no discharge. Left eye exhibits no discharge. No scleral icterus.  Neck: Normal range of motion. Neck supple. No tracheal deviation present. No thyromegaly present.  Cardiovascular: Normal rate, regular rhythm and normal heart sounds.  Exam reveals no gallop and no friction rub.   No murmur heard. Pulmonary/Chest: Breath sounds normal. No accessory muscle usage. Tachypnea (intermittent) noted. No  respiratory distress. He has no decreased breath sounds. He has no wheezes. He has no rhonchi. He has no rales.   He exhibits no tenderness.  Musculoskeletal: Normal range of motion. He exhibits no edema.  Lymphadenopathy:    He has no cervical adenopathy.  Neurological: He is alert and oriented to person, place, and time. No cranial nerve deficit. Coordination normal.  Skin: Skin is warm and dry. No rash noted. He is not diaphoretic. No erythema. No pallor.  Psychiatric: He has a normal mood and affect. His behavior is normal. Judgment and thought content normal.          Assessment & Plan:

## 2012-07-29 NOTE — Telephone Encounter (Signed)
Patient having pain in left side and lower back . Was informed that the next time this occurs that he should call the office . He had a blood clot the last time he had these symptoms.

## 2012-07-29 NOTE — Telephone Encounter (Signed)
Please put him in to see either me or Raquel today.

## 2012-07-30 ENCOUNTER — Encounter: Payer: Self-pay | Admitting: Internal Medicine

## 2012-07-30 ENCOUNTER — Ambulatory Visit: Payer: Self-pay | Admitting: Internal Medicine

## 2012-07-30 ENCOUNTER — Telehealth: Payer: Self-pay | Admitting: Internal Medicine

## 2012-07-30 DIAGNOSIS — R911 Solitary pulmonary nodule: Secondary | ICD-10-CM | POA: Diagnosis not present

## 2012-07-30 DIAGNOSIS — R918 Other nonspecific abnormal finding of lung field: Secondary | ICD-10-CM | POA: Diagnosis not present

## 2012-07-30 NOTE — Telephone Encounter (Signed)
CT Chest normal

## 2012-07-31 MED ORDER — CYCLOBENZAPRINE HCL 5 MG PO TABS: 5.0000 mg | ORAL_TABLET | Freq: Three times a day (TID) | ORAL | Status: DC | PRN

## 2012-08-03 ENCOUNTER — Ambulatory Visit (INDEPENDENT_AMBULATORY_CARE_PROVIDER_SITE_OTHER): Payer: Medicare Other | Admitting: Cardiovascular Disease

## 2012-08-03 ENCOUNTER — Telehealth: Payer: Self-pay | Admitting: Internal Medicine

## 2012-08-03 ENCOUNTER — Encounter: Payer: Self-pay | Admitting: Cardiovascular Disease

## 2012-08-03 VITALS — BP 130/80 | HR 64 | Ht 76.0 in | Wt 233.0 lb

## 2012-08-03 DIAGNOSIS — R109 Unspecified abdominal pain: Secondary | ICD-10-CM

## 2012-08-03 DIAGNOSIS — R079 Chest pain, unspecified: Secondary | ICD-10-CM | POA: Diagnosis not present

## 2012-08-03 DIAGNOSIS — I10 Essential (primary) hypertension: Secondary | ICD-10-CM

## 2012-08-03 DIAGNOSIS — I4891 Unspecified atrial fibrillation: Secondary | ICD-10-CM | POA: Diagnosis not present

## 2012-08-03 NOTE — Telephone Encounter (Signed)
Scheduled him for tomorrow.

## 2012-08-03 NOTE — Telephone Encounter (Signed)
Can you please have this pt come back in this week (may put in 11:30am tomorrow)? Discussed with Dr. Mariah Milling and I would like to have him seen.

## 2012-08-03 NOTE — Assessment & Plan Note (Signed)
Symptoms seemed to have migrated from the left flank now down to left lower back,  Worse with ambulation concerning for musculoskeletal disorder. Case discussed with Dr. Dan Humphreys. She would try to reevaluate him as her office schedule will permit. Symptoms are not getting worse, though no better and worse with movement. We did suggest she could try low-dose NSAIDs though we would need to be cautious given he is on blood thinners. He did not want a pain medication on today's visit.

## 2012-08-03 NOTE — Patient Instructions (Addendum)
You are doing well. No medication changes were made. Continue flexeril for back pain I will discuss further imaging with Dr.walker  Try some naproxen (aleve)  Please call us if you have new issues that need to be addressed before your next appt.  Your physician wants you to follow-up in: 6 months.  You will receive a reminder letter in the mail two months in advance. If you don't receive a letter, please call our office to schedule the follow-up appointment.

## 2012-08-03 NOTE — Assessment & Plan Note (Signed)
Maintaining normal sinus rhythm on today's visit 

## 2012-08-03 NOTE — Progress Notes (Signed)
Patient ID: William Taylor, male    DOB: 1930-05-01, 78 y.o.   MRN: [id number]  HPI Comments: [name] is a very pleasant 78 year old gentleman with a history of coronary artery disease, stent in 2005, repeat catheterization in 2009 showing patent stent with no other significant disease, normal systolic function in September 2011 who presented to the emergency room September 2011 with malaise, atrial fibrillation with RVR in the setting of a urinary tract infection, recent admission to the hospital November 14 2010 for atrial fibrillation that developed after pushing mowing with some chest pain. He converted back to normal sinus rhythm, Presenting to [facility] with chest discomfort with cardiac catheterization Dec 25 2010, showing 90% OM 2 disease, transferred to [facility]  with DES stent placed, 2.25 x 16 mm POMUS stent, with flank pain developing after discharge, admitted to [facility] and diagnosed with pulmonary embolism with heavy clot burden.   His biggest complaint is left low back pain. He reports that 2 weeks ago, he developed acute onset of pain in his left upper flank radiating up into his left chest was seen by Dr. Dan Humphreys who felt his symptoms were musculoskeletal though could not exclude other etiologies such as PE. CT scan of the chest was ordered that showed no PE. He reports that symptoms have continued and have changed location, now in his left low lumbar area. Symptoms are worse with standing and walking, bending over in a chair estimated at 6-7/10 in intensity. Sometimes a dull pain, sometimes a sharp pain.   he was found to be in atrial fibrillation several months ago. He has symptoms of shortness of breath with exertion. He was started on xarelto. He also takes aspirin and Plavix.  He denied any significant cough, PND or orthopnea and no lower exterminate edema. After one month on xarelto, amiodarone load and titration downward was started with conversion to NSR in October 2013  EKG the end of  October confirmed normal sinus rhythm.   He does report having back surgery in 2008, history of prostate cancer followed by Dr. Achilles Dunk    EKG shows normal sinus rhythm with rate 64 beats per minute with no significant ST or T wave changes   Outpatient Encounter Prescriptions as of 08/03/2012  Medication Sig Dispense Refill  . amiodarone (PACERONE) 200 MG tablet Take 1 tablet (200 mg total) by mouth daily.  90 tablet  3  . clopidogrel (PLAVIX) 75 MG tablet Take 1 tablet (75 mg total) by mouth daily.  90 tablet  3  . cyclobenzaprine (FLEXERIL) 5 MG tablet Take 1-2 tablets (5-10 mg total) by mouth 3 (three) times daily as needed for muscle spasms.  90 tablet  0  . diltiazem (CARDIZEM CD) 240 MG 24 hr capsule Take 1 capsule (240 mg total) by mouth daily.  90 capsule  3  . fenofibrate micronized (ANTARA) 130 MG capsule TAKE 1 CAPSULE DAILY BEFORE BREAKFAST  90 capsule  0  . finasteride (PROSCAR) 5 MG tablet Take 5 mg by mouth daily.        Marland Kitchen KRILL OIL PO Take by mouth 2 (two) times daily.      Marland Kitchen levobunolol (BETAGAN) 0.5 % ophthalmic solution 1 drop 2 (two) times daily.       . metoprolol (LOPRESSOR) 50 MG tablet TAKE ONE-HALF (1/2) TABLET (25 MG TOTAL) TWICE A DAY  90 tablet  1  . NEXIUM 40 MG capsule TAKE 1 CAPSULE DAILY  90 capsule  3  . nitroGLYCERIN (  NITROSTAT) 0.4 MG SL tablet Place 1 tablet (0.4 mg total) under the tongue every 5 (five) minutes as needed for chest pain.  25 tablet  3  . pravastatin (PRAVACHOL) 40 MG tablet TAKE 1 TABLET AT BEDTIME  90 tablet  1  . Rivaroxaban (XARELTO) 20 MG TABS Take 1 tablet (20 mg total) by mouth daily.  90 tablet  3  . Tamsulosin HCl (FLOMAX) 0.4 MG CAPS Take 0.4 mg by mouth daily.      Marland Kitchen VITAMIN D, CHOLECALCIFEROL, PO Take by mouth.        Review of Systems  Constitutional: Negative.   HENT: Negative.   Eyes: Negative.   Respiratory: Negative.   Cardiovascular: Negative.   Gastrointestinal: Negative.   Musculoskeletal: Positive for back pain.   Skin: Negative.   Neurological: Negative.   Hematological: Negative.   Psychiatric/Behavioral: Negative.   All other systems reviewed and are negative.   BP 130/80  Pulse 64  Ht 6\' 4"  (1.93 m)  Wt 233 lb (105.688 kg)  BMI 28.36 kg/m2  Physical Exam  Nursing note and vitals reviewed. Constitutional: He is oriented to person, place, and time. He appears well-developed and well-nourished.  HENT:  Head: Normocephalic.  Nose: Nose normal.  Mouth/Throat: Oropharynx is clear and moist.  Eyes: Conjunctivae normal are normal. Pupils are equal, round, and reactive to light.  Neck: Normal range of motion. Neck supple. No JVD present.  Cardiovascular: Normal rate, S1 normal, S2 normal, normal heart sounds and intact distal pulses.  An irregularly irregular rhythm present. Exam reveals no gallop and no friction rub.   No murmur heard. Pulmonary/Chest: Effort normal and breath sounds normal. No respiratory distress. He has no wheezes. He has no rales. He exhibits no tenderness.  Abdominal: Soft. Bowel sounds are normal. He exhibits no distension. There is no tenderness.  Musculoskeletal: Normal range of motion. He exhibits no edema and no tenderness.  Lymphadenopathy:    He has no cervical adenopathy.  Neurological: He is alert and oriented to person, place, and time. Coordination normal.  Skin: Skin is warm and dry. No rash noted. No erythema.  Psychiatric: He has a normal mood and affect. His behavior is normal. Judgment and thought content normal.           Assessment and Plan

## 2012-08-03 NOTE — Assessment & Plan Note (Signed)
Blood pressure is well controlled on today's visit. No changes made to the medications. 

## 2012-08-04 ENCOUNTER — Ambulatory Visit (INDEPENDENT_AMBULATORY_CARE_PROVIDER_SITE_OTHER): Payer: Medicare Other | Admitting: Internal Medicine

## 2012-08-04 ENCOUNTER — Encounter: Payer: Self-pay | Admitting: Internal Medicine

## 2012-08-04 VITALS — BP 140/86 | HR 56 | Temp 98.0°F | Resp 16 | Wt 230.0 lb

## 2012-08-04 DIAGNOSIS — R109 Unspecified abdominal pain: Secondary | ICD-10-CM

## 2012-08-04 NOTE — Assessment & Plan Note (Signed)
Symptoms most prominent in the left lower back at this point. Worse with movement, which seems most consistent with musculoskeletal strain. Discussed with patient today risk and benefits of trying nonsteroidal medications while on blood thinners. He reports good improvement with Tylenol, so we'll stick with Tylenol for now. Discussed maximum dose would be 3 g per day. He e-mail or call with update later this week. If no improvement, would favor getting x-ray of the lumbar spine.

## 2012-08-04 NOTE — Patient Instructions (Signed)
Continue Tylenol 500mg  tablets, up to maximum 1000mg  (2 tablets) every 8 hours for pain.  Email with update on Thursday.  If no improvement, then we will get xray of your lumbar spine later this week.

## 2012-08-04 NOTE — Progress Notes (Signed)
Subjective:    Patient ID: William Taylor, male    DOB: [birth year], 78 y.o.   MRN: [id number]  HPI 78 year old male with history of coronary artery disease, pulmonary embolus on chronic anticoagulation presents for followup of left flank pain. He reports that pain is most located in the lower back at this point. It is worsened with movement. It is described as aching. Last night, he took Tylenol for pain with complete resolution of his symptoms. He reports that over the last few days the pain has gradually improved. He denies any fever, chills, numbness, weakness in his legs. Initially, he had some left-sided chest wall pain with his groin pain and he underwent CT of the chest which showed no recurrent pulmonary embolus.  Outpatient Encounter Prescriptions as of 08/04/2012  Medication Sig Dispense Refill  . amiodarone (PACERONE) 200 MG tablet Take 1 tablet (200 mg total) by mouth daily.  90 tablet  3  . clopidogrel (PLAVIX) 75 MG tablet Take 1 tablet (75 mg total) by mouth daily.  90 tablet  3  . cyclobenzaprine (FLEXERIL) 5 MG tablet Take 1-2 tablets (5-10 mg total) by mouth 3 (three) times daily as needed for muscle spasms.  90 tablet  0  . diltiazem (CARDIZEM CD) 240 MG 24 hr capsule Take 1 capsule (240 mg total) by mouth daily.  90 capsule  3  . diltiazem (DILACOR XR) 240 MG 24 hr capsule       . fenofibrate micronized (ANTARA) 130 MG capsule TAKE 1 CAPSULE DAILY BEFORE BREAKFAST  90 capsule  0  . finasteride (PROSCAR) 5 MG tablet Take 5 mg by mouth daily.        Marland Kitchen KRILL OIL PO Take by mouth 2 (two) times daily.      Marland Kitchen levobunolol (BETAGAN) 0.5 % ophthalmic solution 1 drop 2 (two) times daily.       . metoprolol (LOPRESSOR) 50 MG tablet TAKE ONE-HALF (1/2) TABLET (25 MG TOTAL) TWICE A DAY  90 tablet  1  . NEXIUM 40 MG capsule TAKE 1 CAPSULE DAILY  90 capsule  3  . nitroGLYCERIN (NITROSTAT) 0.4 MG SL tablet Place 1 tablet (0.4 mg total) under the tongue every 5 (five) minutes as needed for  chest pain.  25 tablet  3  . pravastatin (PRAVACHOL) 40 MG tablet TAKE 1 TABLET AT BEDTIME  90 tablet  1  . Rivaroxaban (XARELTO) 20 MG TABS Take 1 tablet (20 mg total) by mouth daily.  90 tablet  3  . Tamsulosin HCl (FLOMAX) 0.4 MG CAPS Take 0.4 mg by mouth daily.      Marland Kitchen VITAMIN D, CHOLECALCIFEROL, PO Take by mouth.       BP 140/86  Pulse 56  Temp 98 F (36.7 C) (Oral)  Resp 16  Wt 230 lb (104.327 kg)  SpO2 95%  Review of Systems  Constitutional: Negative for fever, chills, activity change, appetite change, fatigue and unexpected weight change.  Eyes: Negative for visual disturbance.  Respiratory: Negative for cough and shortness of breath.   Cardiovascular: Negative for chest pain, palpitations and leg swelling.  Gastrointestinal: Negative for abdominal pain and abdominal distention.  Genitourinary: Positive for flank pain. Negative for dysuria, urgency, frequency, hematuria and difficulty urinating.  Musculoskeletal: Positive for myalgias, back pain and arthralgias. Negative for gait problem.  Skin: Negative for color change and rash.  Hematological: Negative for adenopathy.  Psychiatric/Behavioral: Negative for sleep disturbance and dysphoric mood. The patient is not nervous/anxious.  Objective:   Physical Exam  Constitutional: He is oriented to person, place, and time. He appears well-developed and well-nourished. No distress.  HENT:  Head: Normocephalic and atraumatic.  Right Ear: External ear normal.  Left Ear: External ear normal.  Nose: Nose normal.  Mouth/Throat: Oropharynx is clear and moist. No oropharyngeal exudate.  Eyes: Conjunctivae normal and EOM are normal. Pupils are equal, round, and reactive to light. Right eye exhibits no discharge. Left eye exhibits no discharge. No scleral icterus.  Neck: Normal range of motion. Neck supple. No tracheal deviation present. No thyromegaly present.  Pulmonary/Chest: Effort normal and breath sounds normal.    Musculoskeletal: Normal range of motion. He exhibits no edema.       Lumbar back: He exhibits tenderness and pain.       Back:  Lymphadenopathy:    He has no cervical adenopathy.  Neurological: He is alert and oriented to person, place, and time. No cranial nerve deficit. Coordination normal.  Skin: Skin is warm and dry. No rash noted. He is not diaphoretic. No erythema. No pallor.  Psychiatric: He has a normal mood and affect. His behavior is normal. Judgment and thought content normal.          Assessment & Plan:

## 2012-08-06 ENCOUNTER — Encounter: Payer: Self-pay | Admitting: Internal Medicine

## 2012-08-06 DIAGNOSIS — M545 Low back pain, unspecified: Secondary | ICD-10-CM

## 2012-08-07 ENCOUNTER — Ambulatory Visit (INDEPENDENT_AMBULATORY_CARE_PROVIDER_SITE_OTHER)
Admission: RE | Admit: 2012-08-07 | Discharge: 2012-08-07 | Disposition: A | Discharge status: Home / Self Care | Payer: Medicare Other | Source: Ambulatory Visit | Attending: Internal Medicine | Admitting: Internal Medicine

## 2012-08-07 DIAGNOSIS — M545 Low back pain, unspecified: Secondary | ICD-10-CM

## 2012-08-07 DIAGNOSIS — M5137 Other intervertebral disc degeneration, lumbosacral region: Secondary | ICD-10-CM | POA: Diagnosis not present

## 2012-08-07 DIAGNOSIS — M47817 Spondylosis without myelopathy or radiculopathy, lumbosacral region: Secondary | ICD-10-CM | POA: Diagnosis not present

## 2012-08-13 ENCOUNTER — Encounter: Payer: Self-pay | Admitting: Internal Medicine

## 2012-08-14 ENCOUNTER — Telehealth: Payer: Self-pay | Admitting: Internal Medicine

## 2012-08-14 DIAGNOSIS — I4891 Unspecified atrial fibrillation: Secondary | ICD-10-CM

## 2012-08-14 MED ORDER — DILTIAZEM HCL ER COATED BEADS 240 MG PO CP24: 240.0000 mg | ORAL_CAPSULE | Freq: Every day | ORAL | Status: DC

## 2012-08-14 NOTE — Telephone Encounter (Signed)
Med filled.  

## 2012-08-14 NOTE — Telephone Encounter (Signed)
Pt came in needing to get refill on  Diltiazem 240mg   90 day supply if possible [facility]

## 2012-08-15 ENCOUNTER — Other Ambulatory Visit: Payer: Self-pay | Admitting: Internal Medicine

## 2012-08-24 DIAGNOSIS — M5137 Other intervertebral disc degeneration, lumbosacral region: Secondary | ICD-10-CM | POA: Diagnosis not present

## 2012-08-24 DIAGNOSIS — M999 Biomechanical lesion, unspecified: Secondary | ICD-10-CM | POA: Diagnosis not present

## 2012-08-24 DIAGNOSIS — IMO0002 Reserved for concepts with insufficient information to code with codable children: Secondary | ICD-10-CM | POA: Diagnosis not present

## 2012-08-31 ENCOUNTER — Other Ambulatory Visit: Payer: Self-pay | Admitting: Internal Medicine

## 2012-09-03 ENCOUNTER — Ambulatory Visit (INDEPENDENT_AMBULATORY_CARE_PROVIDER_SITE_OTHER): Payer: Medicare Other | Admitting: Internal Medicine

## 2012-09-03 ENCOUNTER — Encounter: Payer: Self-pay | Admitting: Internal Medicine

## 2012-09-03 VITALS — BP 120/70 | HR 60 | Temp 98.7°F | Ht 76.0 in | Wt 236.0 lb

## 2012-09-03 DIAGNOSIS — R109 Unspecified abdominal pain: Secondary | ICD-10-CM

## 2012-09-03 DIAGNOSIS — K4091 Unilateral inguinal hernia, without obstruction or gangrene, recurrent: Secondary | ICD-10-CM | POA: Diagnosis not present

## 2012-09-03 DIAGNOSIS — G4700 Insomnia, unspecified: Secondary | ICD-10-CM

## 2012-09-03 NOTE — Assessment & Plan Note (Signed)
Symptoms have now resolved. Evaluation was normal including CT imaging. Suspect muscular strain. Will continue to monitor.

## 2012-09-03 NOTE — Assessment & Plan Note (Signed)
Discussed good sleep hygiene. Discussed both OTC meds such as benadryl and prescription meds such as ambien. Pt would prefer to try Benadryl. He will call if symptoms not improving.

## 2012-09-03 NOTE — Assessment & Plan Note (Signed)
Palpable abdominal wall defect after twisting injury. S/p previous repair of inguinal hernia at this site. Will set up re-evaluation with general surgery.

## 2012-09-03 NOTE — Progress Notes (Signed)
Subjective:    Patient ID: William Taylor, male    DOB: [birth year], 79 y.o.   MRN: [id number]  HPI 79 year old male with history of atrial fibrillation, pulmonary embolus on chronic anticoagulation presents for followup. At his last visit he had complained of left-sided flank pain. He had extensive evaluation of this including CT imaging which was negative. He reports that symptoms have resolved.  He has 2 concerns today. First, he notes that the other day he twisted when in a seated position and felt a popping sound in his left lower abdomen. Since that time, he has noted bulging at the site. This is the same site of a previous inguinal hernia repair. He denies any constant pain at that site.  Second, he reports difficulty staying asleep. He is able to fall sleep but then often leaks a couple of hours later unable to go back to sleep. In the distant past he had used Ambien to help with sleep he would prefer not to use prescription medications at this time. He practices good sleep hygiene, and has tried to eliminate caffeine as much is possible and to eliminate distractions in his bedroom such as television.  Outpatient Encounter Prescriptions as of 09/03/2012  Medication Sig Dispense Refill  . amiodarone (PACERONE) 200 MG tablet Take 1 tablet (200 mg total) by mouth daily.  90 tablet  3  . clopidogrel (PLAVIX) 75 MG tablet TAKE 1 TABLET DAILY  90 tablet  3  . diltiazem (CARDIZEM CD) 240 MG 24 hr capsule Take 1 capsule (240 mg total) by mouth daily.  90 capsule  3  . fenofibrate micronized (ANTARA) 130 MG capsule TAKE 1 CAPSULE DAILY BEFORE BREAKFAST  90 capsule  3  . finasteride (PROSCAR) 5 MG tablet Take 5 mg by mouth daily.        Marland Kitchen KRILL OIL PO Take by mouth 2 (two) times daily.      Marland Kitchen levobunolol (BETAGAN) 0.5 % ophthalmic solution 1 drop 2 (two) times daily.       . metoprolol (LOPRESSOR) 50 MG tablet TAKE ONE-HALF (1/2) TABLET (25 MG TOTAL) TWICE A DAY  90 tablet  1  . NEXIUM 40 MG  capsule TAKE 1 CAPSULE DAILY  90 capsule  3  . nitroGLYCERIN (NITROSTAT) 0.4 MG SL tablet Place 1 tablet (0.4 mg total) under the tongue every 5 (five) minutes as needed for chest pain.  25 tablet  3  . pravastatin (PRAVACHOL) 40 MG tablet TAKE 1 TABLET AT BEDTIME  90 tablet  1  . Rivaroxaban (XARELTO) 20 MG TABS Take 1 tablet (20 mg total) by mouth daily.  90 tablet  3  . Tamsulosin HCl (FLOMAX) 0.4 MG CAPS Take 0.4 mg by mouth daily.      Marland Kitchen VITAMIN D, CHOLECALCIFEROL, PO Take by mouth.      . [DISCONTINUED] cyclobenzaprine (FLEXERIL) 5 MG tablet Take 1-2 tablets (5-10 mg total) by mouth 3 (three) times daily as needed for muscle spasms.  90 tablet  0  . [DISCONTINUED] diltiazem (DILACOR XR) 240 MG 24 hr capsule        BP 120/70  Pulse 60  Temp 98.7 F (37.1 C) (Oral)  Ht 6\' 4"  (1.93 m)  Wt 236 lb (107.049 kg)  BMI 28.73 kg/m2  SpO2 97%  Review of Systems  Constitutional: Negative for fever, chills, activity change, appetite change, fatigue and unexpected weight change.  Eyes: Negative for visual disturbance.  Respiratory: Negative for cough and shortness of breath.  Cardiovascular: Negative for chest pain, palpitations and leg swelling.  Gastrointestinal: Positive for abdominal pain and abdominal distention.  Genitourinary: Negative for dysuria, urgency and difficulty urinating.  Musculoskeletal: Negative for arthralgias and gait problem.  Skin: Negative for color change and rash.  Hematological: Negative for adenopathy.  Psychiatric/Behavioral: Positive for sleep disturbance. Negative for dysphoric mood. The patient is not nervous/anxious.        Objective:   Physical Exam  Constitutional: He is oriented to person, place, and time. He appears well-developed and well-nourished. No distress.  HENT:  Head: Normocephalic and atraumatic.  Right Ear: External ear normal.  Left Ear: External ear normal.  Nose: Nose normal.  Mouth/Throat: Oropharynx is clear and moist. No  oropharyngeal exudate.  Eyes: Conjunctivae normal and EOM are normal. Pupils are equal, round, and reactive to light. Right eye exhibits no discharge. Left eye exhibits no discharge. No scleral icterus.  Neck: Normal range of motion. Neck supple. No tracheal deviation present. No thyromegaly present.  Cardiovascular: Normal rate, regular rhythm and normal heart sounds.  Exam reveals no gallop and no friction rub.   No murmur heard. Pulmonary/Chest: Effort normal and breath sounds normal. No respiratory distress. He has no wheezes. He has no rales. He exhibits no tenderness.  Abdominal: Normal appearance. There is no tenderness. A hernia is present. Hernia confirmed positive in the left inguinal area.    Musculoskeletal: Normal range of motion. He exhibits no edema.  Lymphadenopathy:    He has no cervical adenopathy.  Neurological: He is alert and oriented to person, place, and time. No cranial nerve deficit. Coordination normal.  Skin: Skin is warm and dry. No rash noted. He is not diaphoretic. No erythema. No pallor.  Psychiatric: He has a normal mood and affect. His behavior is normal. Judgment and thought content normal.          Assessment & Plan:

## 2012-09-21 DIAGNOSIS — R339 Retention of urine, unspecified: Secondary | ICD-10-CM | POA: Insufficient documentation

## 2012-09-21 DIAGNOSIS — IMO0002 Reserved for concepts with insufficient information to code with codable children: Secondary | ICD-10-CM | POA: Diagnosis not present

## 2012-09-21 DIAGNOSIS — M5137 Other intervertebral disc degeneration, lumbosacral region: Secondary | ICD-10-CM | POA: Diagnosis not present

## 2012-09-21 DIAGNOSIS — M999 Biomechanical lesion, unspecified: Secondary | ICD-10-CM | POA: Diagnosis not present

## 2012-09-21 DIAGNOSIS — R39198 Other difficulties with micturition: Secondary | ICD-10-CM | POA: Insufficient documentation

## 2012-09-25 DIAGNOSIS — N139 Obstructive and reflux uropathy, unspecified: Secondary | ICD-10-CM | POA: Diagnosis not present

## 2012-09-25 DIAGNOSIS — R109 Unspecified abdominal pain: Secondary | ICD-10-CM | POA: Diagnosis not present

## 2012-09-25 DIAGNOSIS — R339 Retention of urine, unspecified: Secondary | ICD-10-CM | POA: Diagnosis not present

## 2012-09-25 DIAGNOSIS — C61 Malignant neoplasm of prostate: Secondary | ICD-10-CM | POA: Diagnosis not present

## 2012-10-07 DIAGNOSIS — M722 Plantar fascial fibromatosis: Secondary | ICD-10-CM | POA: Diagnosis not present

## 2012-10-27 DIAGNOSIS — IMO0002 Reserved for concepts with insufficient information to code with codable children: Secondary | ICD-10-CM | POA: Diagnosis not present

## 2012-10-27 DIAGNOSIS — M999 Biomechanical lesion, unspecified: Secondary | ICD-10-CM | POA: Diagnosis not present

## 2012-10-27 DIAGNOSIS — M5137 Other intervertebral disc degeneration, lumbosacral region: Secondary | ICD-10-CM | POA: Diagnosis not present

## 2012-11-15 ENCOUNTER — Other Ambulatory Visit: Payer: Self-pay | Admitting: Internal Medicine

## 2012-11-23 ENCOUNTER — Ambulatory Visit (INDEPENDENT_AMBULATORY_CARE_PROVIDER_SITE_OTHER): Payer: Medicare Other | Admitting: Internal Medicine

## 2012-11-23 ENCOUNTER — Encounter: Payer: Self-pay | Admitting: Internal Medicine

## 2012-11-23 VITALS — BP 150/84 | HR 67 | Temp 97.6°F | Wt 234.0 lb

## 2012-11-23 DIAGNOSIS — N183 Chronic kidney disease, stage 3 unspecified: Secondary | ICD-10-CM | POA: Diagnosis not present

## 2012-11-23 DIAGNOSIS — R0609 Other forms of dyspnea: Secondary | ICD-10-CM

## 2012-11-23 DIAGNOSIS — E785 Hyperlipidemia, unspecified: Secondary | ICD-10-CM

## 2012-11-23 DIAGNOSIS — R0989 Other specified symptoms and signs involving the circulatory and respiratory systems: Secondary | ICD-10-CM | POA: Diagnosis not present

## 2012-11-23 DIAGNOSIS — I10 Essential (primary) hypertension: Secondary | ICD-10-CM | POA: Diagnosis not present

## 2012-11-23 DIAGNOSIS — I4891 Unspecified atrial fibrillation: Secondary | ICD-10-CM

## 2012-11-23 DIAGNOSIS — R9389 Abnormal findings on diagnostic imaging of other specified body structures: Secondary | ICD-10-CM | POA: Insufficient documentation

## 2012-11-23 DIAGNOSIS — R0600 Dyspnea, unspecified: Secondary | ICD-10-CM

## 2012-11-23 NOTE — Progress Notes (Signed)
Subjective:    Patient ID: William Taylor, male    DOB: 1930/08/16, 79 y.o.   MRN: [id number]  HPI 79 year old male with history of atrial fibrillation, pulmonary embolus, hypertension presents for followup. At his last visit, he complained of left-sided flank pain. Evaluation including imaging with CT was unremarkable. He reports that pain has resolved. He reports he is generally feeling well. His only concern today is mild ongoing shortness of breath with exertion. This is been ongoing for several years. He reports history in the past of exposure to asbestos and is concerned about side effects from this. He has never been a smoker but was exposed to secondhand smoke. He denies any cough or chest pain. Recent CT of the chest in December 2013 was normal except for 2 mm nodules.  Outpatient Encounter Prescriptions as of 11/23/2012  Medication Sig Dispense Refill  . amiodarone (PACERONE) 200 MG tablet Take 1 tablet (200 mg total) by mouth daily.  90 tablet  3  . clopidogrel (PLAVIX) 75 MG tablet TAKE 1 TABLET DAILY  90 tablet  3  . diltiazem (CARDIZEM CD) 240 MG 24 hr capsule Take 1 capsule (240 mg total) by mouth daily.  90 capsule  3  . fenofibrate micronized (ANTARA) 130 MG capsule TAKE 1 CAPSULE DAILY BEFORE BREAKFAST  90 capsule  3  . finasteride (PROSCAR) 5 MG tablet Take 5 mg by mouth daily.        Marland Kitchen KRILL OIL PO Take by mouth 2 (two) times daily.      Marland Kitchen levobunolol (BETAGAN) 0.5 % ophthalmic solution 1 drop 2 (two) times daily.       . metoprolol (LOPRESSOR) 50 MG tablet TAKE ONE-HALF (1/2) TABLET (25 MG TOTAL) TWICE A DAY  90 tablet  1  . NEXIUM 40 MG capsule TAKE 1 CAPSULE DAILY  90 capsule  3  . nitroGLYCERIN (NITROSTAT) 0.4 MG SL tablet Place 1 tablet (0.4 mg total) under the tongue every 5 (five) minutes as needed for chest pain.  25 tablet  3  . pravastatin (PRAVACHOL) 40 MG tablet TAKE 1 TABLET AT BEDTIME  90 tablet  1  . Rivaroxaban (XARELTO) 20 MG TABS Take 1 tablet (20 mg total)  by mouth daily.  90 tablet  3  . Tamsulosin HCl (FLOMAX) 0.4 MG CAPS Take 0.4 mg by mouth daily.      Marland Kitchen VITAMIN D, CHOLECALCIFEROL, PO Take by mouth.      . [DISCONTINUED] diltiazem (DILACOR XR) 240 MG 24 hr capsule TAKE 1 CAPSULE DAILY  30 capsule  4   No facility-administered encounter medications on file as of 11/23/2012.   BP 150/84  Pulse 67  Temp(Src) 97.6 F (36.4 C) (Oral)  Wt 234 lb (106.142 kg)  BMI 28.5 kg/m2  SpO2 97%  Review of Systems  Constitutional: Negative for fever, chills, activity change, appetite change, fatigue and unexpected weight change.  Eyes: Negative for visual disturbance.  Respiratory: Positive for shortness of breath. Negative for cough.   Cardiovascular: Negative for chest pain, palpitations and leg swelling.  Gastrointestinal: Negative for abdominal pain and abdominal distention.  Genitourinary: Negative for dysuria, urgency and difficulty urinating.  Musculoskeletal: Negative for arthralgias and gait problem.  Skin: Negative for color change and rash.  Hematological: Negative for adenopathy.  Psychiatric/Behavioral: Negative for sleep disturbance and dysphoric mood. The patient is not nervous/anxious.        Objective:   Physical Exam  Constitutional: He is oriented to person, place, and time.  He appears well-developed and well-nourished. No distress.  HENT:  Head: Normocephalic and atraumatic.  Right Ear: External ear normal.  Left Ear: External ear normal.  Nose: Nose normal.  Mouth/Throat: Oropharynx is clear and moist. No oropharyngeal exudate.  Eyes: Conjunctivae and EOM are normal. Pupils are equal, round, and reactive to light. Right eye exhibits no discharge. Left eye exhibits no discharge. No scleral icterus.  Neck: Normal range of motion. Neck supple. No tracheal deviation present. No thyromegaly present.  Cardiovascular: Normal rate, regular rhythm and normal heart sounds.  Exam reveals no gallop and no friction rub.   No murmur  heard. Pulmonary/Chest: Effort normal and breath sounds normal. No accessory muscle usage. Not tachypneic. No respiratory distress. He has no decreased breath sounds. He has no wheezes. He has no rhonchi. He has no rales. He exhibits no tenderness.  Musculoskeletal: Normal range of motion. He exhibits no edema.  Lymphadenopathy:    He has no cervical adenopathy.  Neurological: He is alert and oriented to person, place, and time. No cranial nerve deficit. Coordination normal.  Skin: Skin is warm and dry. No rash noted. He is not diaphoretic. No erythema. No pallor.  Psychiatric: He has a normal mood and affect. His behavior is normal. Judgment and thought content normal.          Assessment & Plan:

## 2012-11-23 NOTE — Assessment & Plan Note (Signed)
Will plan to recheck renal function with fasting labs 01/2013.

## 2012-11-23 NOTE — Assessment & Plan Note (Signed)
Plan to repeat CT 01/2013.

## 2012-11-23 NOTE — Assessment & Plan Note (Signed)
Rate well controlled with current medications. Anti-coagulated with Xarelto. Will continue current medicine.

## 2012-11-23 NOTE — Assessment & Plan Note (Signed)
BP Readings from Last 3 Encounters:  11/23/12 150/84  09/03/12 120/70  08/04/12 140/86   BP generally well controlled on current medications. Will continue. Will plan to recheck renal function with labs at visit in 01/2013.

## 2012-11-23 NOTE — Assessment & Plan Note (Addendum)
Chronic mild dyspnea on exertion. H/o exposure to second hand smoke and asbestos. Previous CT Chest in 07/2012 showed two 2mm nodules. Reviewed this with pt today. Discussed getting PFTs to further evaluate symptoms of dyspnea. Pt would prefer to hold off at this time. He will call if symptoms are worsening. Will plan to repeat CT chest in 01/2013 to ensure stability of nodules.

## 2012-11-24 DIAGNOSIS — M999 Biomechanical lesion, unspecified: Secondary | ICD-10-CM | POA: Diagnosis not present

## 2012-11-24 DIAGNOSIS — IMO0002 Reserved for concepts with insufficient information to code with codable children: Secondary | ICD-10-CM | POA: Diagnosis not present

## 2012-11-24 DIAGNOSIS — M5137 Other intervertebral disc degeneration, lumbosacral region: Secondary | ICD-10-CM | POA: Diagnosis not present

## 2012-12-14 DIAGNOSIS — H4010X0 Unspecified open-angle glaucoma, stage unspecified: Secondary | ICD-10-CM | POA: Diagnosis not present

## 2012-12-17 ENCOUNTER — Ambulatory Visit (INDEPENDENT_AMBULATORY_CARE_PROVIDER_SITE_OTHER): Payer: Medicare Other | Admitting: Internal Medicine

## 2012-12-17 ENCOUNTER — Encounter: Payer: Self-pay | Admitting: Internal Medicine

## 2012-12-17 VITALS — BP 140/96 | HR 73 | Temp 98.2°F | Wt 231.0 lb

## 2012-12-17 DIAGNOSIS — J0110 Acute frontal sinusitis, unspecified: Secondary | ICD-10-CM

## 2012-12-17 MED ORDER — HYDROCOD POLST-CHLORPHEN POLST 10-8 MG/5ML PO LQCR: 5.0000 mL | Freq: Two times a day (BID) | ORAL | Status: DC | PRN

## 2012-12-17 MED ORDER — AMOXICILLIN-POT CLAVULANATE 875-125 MG PO TABS: 1.0000 | ORAL_TABLET | Freq: Two times a day (BID) | ORAL | Status: DC

## 2012-12-17 NOTE — Progress Notes (Signed)
Subjective:    Patient ID: William Taylor, male    DOB: 05/24/1930, 79 y.o.   MRN: [id number]  HPI 79 year old male with history of pulmonary embolus, atrial fibrillation, hypertension presents for acute visit complaining of 10 day history of nasal congestion and generally non-productive cough. He reports cough is most prominent at night when he lies down. Nasal congestion associated with some frontal headache. He denies any fever or chills. He denies chest pain. He denies shortness of breath. He has not been taking any medication for his symptoms.  Outpatient Encounter Prescriptions as of 12/17/2012  Medication Sig Dispense Refill  . amiodarone (PACERONE) 200 MG tablet Take 1 tablet (200 mg total) by mouth daily.  90 tablet  3  . clopidogrel (PLAVIX) 75 MG tablet TAKE 1 TABLET DAILY  90 tablet  3  . diltiazem (CARDIZEM CD) 240 MG 24 hr capsule Take 1 capsule (240 mg total) by mouth daily.  90 capsule  3  . fenofibrate micronized (ANTARA) 130 MG capsule TAKE 1 CAPSULE DAILY BEFORE BREAKFAST  90 capsule  3  . finasteride (PROSCAR) 5 MG tablet Take 5 mg by mouth daily.        Marland Kitchen KRILL OIL PO Take by mouth 2 (two) times daily.      Marland Kitchen levobunolol (BETAGAN) 0.5 % ophthalmic solution 1 drop 2 (two) times daily.       . metoprolol (LOPRESSOR) 50 MG tablet TAKE ONE-HALF (1/2) TABLET (25 MG TOTAL) TWICE A DAY  90 tablet  1  . NEXIUM 40 MG capsule TAKE 1 CAPSULE DAILY  90 capsule  3  . nitroGLYCERIN (NITROSTAT) 0.4 MG SL tablet Place 1 tablet (0.4 mg total) under the tongue every 5 (five) minutes as needed for chest pain.  25 tablet  3  . pravastatin (PRAVACHOL) 40 MG tablet TAKE 1 TABLET AT BEDTIME  90 tablet  1  . Rivaroxaban (XARELTO) 20 MG TABS Take 1 tablet (20 mg total) by mouth daily.  90 tablet  3  . Tamsulosin HCl (FLOMAX) 0.4 MG CAPS Take 0.4 mg by mouth daily.      Marland Kitchen VITAMIN D, CHOLECALCIFEROL, PO Take by mouth.      Marland Kitchen amoxicillin-clavulanate (AUGMENTIN) 875-125 MG per tablet Take 1 tablet by  mouth 2 (two) times daily.  20 tablet  0  . chlorpheniramine-HYDROcodone (TUSSIONEX) 10-8 MG/5ML LQCR Take 5 mLs by mouth every 12 (twelve) hours as needed.  140 mL  0   No facility-administered encounter medications on file as of 12/17/2012.   BP 140/96  Pulse 73  Temp(Src) 98.2 F (36.8 C) (Oral)  Wt 231 lb (104.781 kg)  BMI 28.13 kg/m2  SpO2 94%  Review of Systems  Constitutional: Positive for fatigue. Negative for fever, chills, diaphoresis, activity change, appetite change and unexpected weight change.  HENT: Positive for congestion, rhinorrhea, postnasal drip and sinus pressure (frontal). Negative for hearing loss, ear pain, nosebleeds, sore throat, facial swelling, sneezing, drooling, mouth sores, trouble swallowing, neck pain, neck stiffness, dental problem, voice change, tinnitus and ear discharge.   Eyes: Negative for photophobia, pain, discharge, redness, itching and visual disturbance.  Respiratory: Positive for cough. Negative for apnea, choking, chest tightness, shortness of breath, wheezing and stridor.   Cardiovascular: Negative for chest pain, palpitations and leg swelling.  Gastrointestinal: Negative for abdominal pain, diarrhea and abdominal distention.  Musculoskeletal: Negative for myalgias.  Skin: Negative for color change, rash and wound.  Neurological: Negative for dizziness and headaches.  Objective:   Physical Exam  Constitutional: He is oriented to person, place, and time. He appears well-developed and well-nourished. No distress.  HENT:  Head: Normocephalic and atraumatic.  Right Ear: External ear normal. Tympanic membrane is not erythematous and not bulging. No middle ear effusion.  Left Ear: External ear normal. Tympanic membrane is not erythematous and not bulging.  No middle ear effusion.  Nose: Right sinus exhibits frontal sinus tenderness. Right sinus exhibits no maxillary sinus tenderness. Left sinus exhibits frontal sinus tenderness. Left sinus  exhibits no maxillary sinus tenderness.  Mouth/Throat: Oropharynx is clear and moist. No oropharyngeal exudate.  Eyes: Conjunctivae and EOM are normal. Pupils are equal, round, and reactive to light. Right eye exhibits no discharge. Left eye exhibits no discharge. No scleral icterus.  Neck: Normal range of motion. Neck supple. No tracheal deviation present. No thyromegaly present.  Cardiovascular: Normal rate, regular rhythm and normal heart sounds.  Exam reveals no gallop and no friction rub.   No murmur heard. Pulmonary/Chest: Effort normal and breath sounds normal. No accessory muscle usage. Not tachypneic. No respiratory distress. He has no decreased breath sounds. He has no wheezes. He has no rhonchi. He has no rales. He exhibits no tenderness.  Musculoskeletal: Normal range of motion. He exhibits no edema.  Lymphadenopathy:    He has no cervical adenopathy.  Neurological: He is alert and oriented to person, place, and time. No cranial nerve deficit. Coordination normal.  Skin: Skin is warm and dry. No rash noted. He is not diaphoretic. No erythema. No pallor.  Psychiatric: He has a normal mood and affect. His behavior is normal. Judgment and thought content normal.          Assessment & Plan:

## 2012-12-17 NOTE — Assessment & Plan Note (Signed)
Symptoms and exam most consistent with acute frontal sinusitis. Will treat with augmentin and use tussionex prn for cough. Will start non-sedating antihistamine, claritin. Follow up if symptoms ar not improving over next 48 hr.

## 2012-12-23 DIAGNOSIS — M5137 Other intervertebral disc degeneration, lumbosacral region: Secondary | ICD-10-CM | POA: Diagnosis not present

## 2012-12-23 DIAGNOSIS — IMO0002 Reserved for concepts with insufficient information to code with codable children: Secondary | ICD-10-CM | POA: Diagnosis not present

## 2012-12-23 DIAGNOSIS — M999 Biomechanical lesion, unspecified: Secondary | ICD-10-CM | POA: Diagnosis not present

## 2012-12-26 ENCOUNTER — Ambulatory Visit (INDEPENDENT_AMBULATORY_CARE_PROVIDER_SITE_OTHER): Payer: Medicare Other | Admitting: Family Medicine

## 2012-12-26 VITALS — BP 120/74 | HR 60 | Temp 98.0°F | Wt 227.0 lb

## 2012-12-26 DIAGNOSIS — R05 Cough: Secondary | ICD-10-CM | POA: Diagnosis not present

## 2012-12-26 DIAGNOSIS — R059 Cough, unspecified: Secondary | ICD-10-CM | POA: Diagnosis not present

## 2012-12-26 NOTE — Progress Notes (Signed)
  Subjective:    Patient ID: William Taylor, male    DOB: Mar 28, 1930, 79 y.o.   MRN: [id number]  HPI  [facility] with persistent cough for past few weeks He was seen recently by primary provider and started on Augmentin and Tussionex. Tussionex does help cough briefly but he's had some increase fatigue related to taking meds. Cough occasionally productive of greenish sputum. No fevers or chills. He has minimal nasal congestion. Denies any appetite changes, weight changes, dyspnea, wheezing, hemoptysis, or any pleuritic pains  Long history of GERD that is controlled with Nexium. No ACE inhibitor use. Nonmentholated cough drops do help.  Past Medical History  Diagnosis Date  . Coronary artery disease   . Hypertension   . Hyperlipidemia   . Arrhythmia     A-Fib.   Past Surgical History  Procedure Laterality Date  . Coronary angioplasty  2009    2005; s/p stent  . Cataract extraction  Oct. 3, 2012    right eye  . Hernia repair  2013  . Lumbar spine surgery      reports that he has never smoked. He has never used smokeless tobacco. He reports that he does not drink alcohol or use illicit drugs. family history includes Colon cancer in his father and Stroke in his mother. Allergies  Allergen Reactions  . Warfarin And Related     Joints ache.     Review of Systems  Constitutional: Positive for fatigue. Negative for fever and chills.  HENT: Positive for congestion.   Respiratory: Positive for cough. Negative for shortness of breath and wheezing.   Cardiovascular: Negative for chest pain.  Neurological: Negative for syncope and headaches.       Objective:   Physical Exam  Constitutional: He appears well-developed and well-nourished.  HENT:  Right Ear: External ear normal.  Left Ear: External ear normal.  Mouth/Throat: Oropharynx is clear and moist.  Neck: Neck supple.  Cardiovascular: Normal rate and regular rhythm.   Pulmonary/Chest:  Effort normal and breath sounds normal. No respiratory distress. He has no wheezes. He has no rales.  Musculoskeletal: He exhibits no edema.  Lymphadenopathy:    He has no cervical adenopathy.          Assessment & Plan:  Cough. Suspect post viral bronchitis cough. Nonfocal exam. Afebrile. Finish out Augmentin. Continue Tussionex as needed. Touch base with primary if cough not resolving over the next week or 2

## 2012-12-26 NOTE — Patient Instructions (Addendum)
Finish out Augmentin Continue Tussionex as needed for cough. Followup promptly for any fever or, increased shortness of breath, or other worsening symptoms

## 2013-01-19 DIAGNOSIS — M999 Biomechanical lesion, unspecified: Secondary | ICD-10-CM | POA: Diagnosis not present

## 2013-01-19 DIAGNOSIS — IMO0002 Reserved for concepts with insufficient information to code with codable children: Secondary | ICD-10-CM | POA: Diagnosis not present

## 2013-01-19 DIAGNOSIS — M5137 Other intervertebral disc degeneration, lumbosacral region: Secondary | ICD-10-CM | POA: Diagnosis not present

## 2013-01-27 ENCOUNTER — Encounter: Payer: Medicare Other | Admitting: Internal Medicine

## 2013-01-27 DIAGNOSIS — M999 Biomechanical lesion, unspecified: Secondary | ICD-10-CM | POA: Diagnosis not present

## 2013-01-27 DIAGNOSIS — M5380 Other specified dorsopathies, site unspecified: Secondary | ICD-10-CM | POA: Diagnosis not present

## 2013-02-01 ENCOUNTER — Other Ambulatory Visit: Payer: Self-pay | Admitting: Internal Medicine

## 2013-02-01 DIAGNOSIS — M999 Biomechanical lesion, unspecified: Secondary | ICD-10-CM | POA: Diagnosis not present

## 2013-02-01 DIAGNOSIS — IMO0002 Reserved for concepts with insufficient information to code with codable children: Secondary | ICD-10-CM | POA: Diagnosis not present

## 2013-02-01 DIAGNOSIS — M5137 Other intervertebral disc degeneration, lumbosacral region: Secondary | ICD-10-CM | POA: Diagnosis not present

## 2013-02-08 ENCOUNTER — Encounter: Payer: Self-pay | Admitting: Cardiovascular Disease

## 2013-02-08 ENCOUNTER — Ambulatory Visit (INDEPENDENT_AMBULATORY_CARE_PROVIDER_SITE_OTHER): Payer: Medicare Other | Admitting: Cardiovascular Disease

## 2013-02-08 VITALS — BP 110/78 | HR 57 | Ht 76.0 in | Wt 228.5 lb

## 2013-02-08 DIAGNOSIS — E785 Hyperlipidemia, unspecified: Secondary | ICD-10-CM | POA: Diagnosis not present

## 2013-02-08 DIAGNOSIS — I2510 Atherosclerotic heart disease of native coronary artery without angina pectoris: Secondary | ICD-10-CM | POA: Diagnosis not present

## 2013-02-08 DIAGNOSIS — I4891 Unspecified atrial fibrillation: Secondary | ICD-10-CM | POA: Diagnosis not present

## 2013-02-08 MED ORDER — NITROGLYCERIN 0.4 MG SL SUBL: 0.4000 mg | SUBLINGUAL_TABLET | SUBLINGUAL | Status: DC | PRN

## 2013-02-08 NOTE — Assessment & Plan Note (Signed)
Currently with no symptoms of angina. No further workup at this time. Continue current medication regimen. 

## 2013-02-08 NOTE — Patient Instructions (Addendum)
You are doing well. No medication changes were made.  Please call us if you have new issues that need to be addressed before your next appt.  Your physician wants you to follow-up in: 6 months.  You will receive a reminder letter in the mail two months in advance. If you don't receive a letter, please call our office to schedule the follow-up appointment.   

## 2013-02-08 NOTE — Assessment & Plan Note (Signed)
Rhythm is normal. No indication of recurrent atrial fibrillation. We'll continue him on anticoagulation given history of DVT.

## 2013-02-08 NOTE — Assessment & Plan Note (Signed)
Cholesterol is at goal on the current lipid regimen. No changes to the medications were made.  

## 2013-02-08 NOTE — Progress Notes (Signed)
Patient ID: William Taylor, male    DOB: [birth year], 79 y.o.   MRN: [id number]  HPI Comments: William Taylor is a very pleasant 79 year old gentleman with a history of coronary artery disease, stent in 2005, repeat catheterization in 2009 showing patent stent with no other significant disease, normal systolic function in September 2011 who presented to the emergency room September 2011 with malaise, atrial fibrillation with RVR in the setting of a urinary tract infection, recent admission to the hospital November 14 2010 for atrial fibrillation that developed after pushing mowing with some chest pain. He converted back to normal sinus rhythm, Presenting to [facility] with chest discomfort with cardiac catheterization Dec 25 2010, showing 90% OM 2 disease, transferred to [facility]  with DES stent placed, 2.25 x 16 mm POMUS stent, with flank pain developing after discharge, admitted to [facility] and diagnosed with pulmonary embolism with heavy clot burden.    Found to be in atrial fibrillation in 2013. He had symptoms of shortness of breath with exertion.  After one month on xarelto, amiodarone load and titration downward was started with conversion to NSR in October 2013  EKG the end of October 2013 confirmed normal sinus rhythm.   History of back surgery in 2008, history of prostate cancer followed by Dr. Achilles Dunk   Overall today he reports doing well. He does have some mild shortness of breath with heavy exertion or stairs. Denies any significant lower extremity edema. Overall no changes. Denies any palpitations or tachycardia concerning for atrial fibrillation. Has continued on anticoagulation.   EKG shows normal sinus rhythm with rate 57 beats per minute with no significant ST or T wave changes   Outpatient Encounter Prescriptions as of 02/08/2013  Medication Sig Dispense Refill  . amiodarone (PACERONE) 200 MG tablet Take 1 tablet (200 mg total) by mouth daily.  90 tablet  3  . amoxicillin-clavulanate (AUGMENTIN)  875-125 MG per tablet Take 1 tablet by mouth 2 (two) times daily.  20 tablet  0  . chlorpheniramine-HYDROcodone (TUSSIONEX) 10-8 MG/5ML LQCR Take 5 mLs by mouth every 12 (twelve) hours as needed.  140 mL  0  . clopidogrel (PLAVIX) 75 MG tablet TAKE 1 TABLET DAILY  90 tablet  3  . diltiazem (CARDIZEM CD) 240 MG 24 hr capsule Take 1 capsule (240 mg total) by mouth daily.  90 capsule  3  . fenofibrate micronized (ANTARA) 130 MG capsule TAKE 1 CAPSULE DAILY BEFORE BREAKFAST  90 capsule  3  . finasteride (PROSCAR) 5 MG tablet Take 5 mg by mouth daily.        Marland Kitchen KRILL OIL PO Take by mouth 2 (two) times daily.      Marland Kitchen levobunolol (BETAGAN) 0.5 % ophthalmic solution 1 drop 2 (two) times daily.       . metoprolol (LOPRESSOR) 50 MG tablet TAKE ONE-HALF (1/2) TABLET (25 MG TOTAL) TWICE A DAY  90 tablet  1  . NEXIUM 40 MG capsule TAKE 1 CAPSULE DAILY  90 capsule  3  . nitroGLYCERIN (NITROSTAT) 0.4 MG SL tablet Place 1 tablet (0.4 mg total) under the tongue every 5 (five) minutes as needed for chest pain.  25 tablet  3  . pravastatin (PRAVACHOL) 40 MG tablet TAKE 1 TABLET AT BEDTIME  90 tablet  1  . Rivaroxaban (XARELTO) 20 MG TABS Take 1 tablet (20 mg total) by mouth daily.  90 tablet  3  . Tamsulosin HCl (FLOMAX) 0.4 MG CAPS Take 0.4 mg by mouth daily.      Marland Kitchen  VITAMIN D, CHOLECALCIFEROL, PO Take by mouth.       No facility-administered encounter medications on file as of 02/08/2013.    Review of Systems  Constitutional: Negative.   HENT: Negative.   Eyes: Negative.   Respiratory: Negative.   Cardiovascular: Negative.   Gastrointestinal: Negative.   Musculoskeletal: Positive for back pain.  Skin: Negative.   Neurological: Negative.   Psychiatric/Behavioral: Negative.   All other systems reviewed and are negative.   BP 110/78  Pulse 57  Ht 6\' 4"  (1.93 m)  Wt 228 lb 8 oz (103.647 kg)  BMI 27.83 kg/m2  Physical Exam  Nursing note and vitals reviewed. Constitutional: He is oriented to person,  place, and time. He appears well-developed and well-nourished.  HENT:  Head: Normocephalic.  Nose: Nose normal.  Mouth/Throat: Oropharynx is clear and moist.  Eyes: Conjunctivae are normal. Pupils are equal, round, and reactive to light.  Neck: Normal range of motion. Neck supple. No JVD present.  Cardiovascular: Normal rate, S1 normal, S2 normal, normal heart sounds and intact distal pulses.  An irregularly irregular rhythm present. Exam reveals no gallop and no friction rub.   No murmur heard. Pulmonary/Chest: Effort normal and breath sounds normal. No respiratory distress. He has no wheezes. He has no rales. He exhibits no tenderness.  Abdominal: Soft. Bowel sounds are normal. He exhibits no distension. There is no tenderness.  Musculoskeletal: Normal range of motion. He exhibits no edema and no tenderness.  Lymphadenopathy:    He has no cervical adenopathy.  Neurological: He is alert and oriented to person, place, and time. Coordination normal.  Skin: Skin is warm and dry. No rash noted. No erythema.  Psychiatric: He has a normal mood and affect. His behavior is normal. Judgment and thought content normal.      Assessment and Plan

## 2013-02-08 NOTE — Assessment & Plan Note (Signed)
Blood pressure is well controlled on today's visit. No changes made to the medications. 

## 2013-02-16 DIAGNOSIS — M5030 Other cervical disc degeneration, unspecified cervical region: Secondary | ICD-10-CM | POA: Diagnosis not present

## 2013-02-16 DIAGNOSIS — M5137 Other intervertebral disc degeneration, lumbosacral region: Secondary | ICD-10-CM | POA: Diagnosis not present

## 2013-02-16 DIAGNOSIS — M9981 Other biomechanical lesions of cervical region: Secondary | ICD-10-CM | POA: Diagnosis not present

## 2013-02-16 DIAGNOSIS — M999 Biomechanical lesion, unspecified: Secondary | ICD-10-CM | POA: Diagnosis not present

## 2013-03-09 ENCOUNTER — Other Ambulatory Visit: Payer: Self-pay | Admitting: Cardiovascular Disease

## 2013-03-15 ENCOUNTER — Encounter: Payer: Self-pay | Admitting: Internal Medicine

## 2013-03-15 ENCOUNTER — Ambulatory Visit (INDEPENDENT_AMBULATORY_CARE_PROVIDER_SITE_OTHER): Payer: Medicare Other | Admitting: Internal Medicine

## 2013-03-15 ENCOUNTER — Other Ambulatory Visit: Payer: Self-pay | Admitting: Internal Medicine

## 2013-03-15 VITALS — BP 120/68 | HR 59 | Temp 97.8°F | Resp 12 | Ht 76.5 in | Wt 226.0 lb

## 2013-03-15 DIAGNOSIS — R9389 Abnormal findings on diagnostic imaging of other specified body structures: Secondary | ICD-10-CM | POA: Diagnosis not present

## 2013-03-15 DIAGNOSIS — I4891 Unspecified atrial fibrillation: Secondary | ICD-10-CM

## 2013-03-15 DIAGNOSIS — Z0000 Encounter for general adult medical examination without abnormal findings: Secondary | ICD-10-CM | POA: Diagnosis not present

## 2013-03-15 DIAGNOSIS — I10 Essential (primary) hypertension: Secondary | ICD-10-CM | POA: Diagnosis not present

## 2013-03-15 DIAGNOSIS — E785 Hyperlipidemia, unspecified: Secondary | ICD-10-CM | POA: Diagnosis not present

## 2013-03-15 DIAGNOSIS — C61 Malignant neoplasm of prostate: Secondary | ICD-10-CM | POA: Insufficient documentation

## 2013-03-15 DIAGNOSIS — G4700 Insomnia, unspecified: Secondary | ICD-10-CM

## 2013-03-15 LAB — COMPREHENSIVE METABOLIC PANEL
AST: 28 U/L (ref 0–37)
Alkaline Phosphatase: 28 U/L — ABNORMAL LOW (ref 39–117)
BUN: 21 mg/dL (ref 6–23)
Creatinine, Ser: 1.4 mg/dL (ref 0.4–1.5)
Potassium: 4.1 mEq/L (ref 3.5–5.1)
Total Bilirubin: 0.7 mg/dL (ref 0.3–1.2)

## 2013-03-15 LAB — LIPID PANEL
HDL: 36.1 mg/dL — ABNORMAL LOW (ref >39.00)
LDL Cholesterol: 69 mg/dL (ref 0–99)
Total CHOL/HDL Ratio: 3
Triglycerides: 63 mg/dL (ref 0.0–149.0)
VLDL: 12.6 mg/dL (ref 0.0–40.0)

## 2013-03-15 NOTE — Assessment & Plan Note (Signed)
Asymptomatic. NSR today on Amiodarone. Will continue to monitor.

## 2013-03-15 NOTE — Assessment & Plan Note (Signed)
BP Readings from Last 3 Encounters:  03/15/13 120/68  02/08/13 110/78  12/26/12 120/74   BP well controlled on current meds. Will continue.

## 2013-03-15 NOTE — Progress Notes (Signed)
Subjective:    Patient ID: William Taylor, male    DOB: 1929-09-21, 79 y.o.   MRN: [id number]  HPI The patient is here for annual Medicare wellness examination and management of other chronic and acute problems.   The risk factors are reflected in the social history.  The roster of all physicians providing medical care to patient - is listed in the Snapshot section of the chart.  Activities of daily living:  The patient is 100% independent in all ADLs: dressing, toileting, feeding as well as independent mobility  Home safety : The patient has smoke detectors in the home. They wear seatbelts.  There are no firearms at home. There is no violence in the home.   There is no risks for hepatitis, STDs or HIV. There is no  history of blood transfusion. They have no travel history to infectious disease endemic areas of the world.  The patient has seen their dentist in the last six month. Dr. Cheral Almas They have seen their eye doctor in the last year. Dr. Haskel Khan Wears hearing aids.They have deferred audiologic testing in the last year.   They do not  have excessive sun exposure. Discussed the need for sun protection: hats, long sleeves and use of sunscreen if there is significant sun exposure. Dr. Roseanne Kaufman  Diet: the importance of a healthy diet is discussed. They do have a healthy diet.  The benefits of regular aerobic exercise were discussed. He walks daily in local mall.  Depression screen: there are no signs or vegative symptoms of depression- irritability, change in appetite, anhedonia, sadness/tearfullness.  Cognitive assessment: the patient manages all their financial and personal affairs and is actively engaged. They could relate day,date,year and events.  The following portions of the patient's history were reviewed and updated as appropriate: allergies, current medications, past family history, past medical history,  past surgical history, past social history  and problem  list.  Visual acuity was not assessed per patient preference since he has regular follow up with his ophthalmologist. Hearing and body mass index were assessed and reviewed.   During the course of the visit the patient was educated and counseled about appropriate screening and preventive services including : fall prevention , diabetes screening, nutrition counseling, colorectal cancer screening, and recommended immunizations.    In regards to insomnia, he reports no improvement with use of Benadryl. He is planning to try over-the-counter melatonin. He notes in the past used Ambien occasionally with some improvement. He has trouble both falling asleep and staying asleep.  In regards to recent diagnosis of prostate cancer, he reports he is followed every 6 months now by his urologist. Recent PSA was normal per his report. No symptoms of urinary hesitancy, urgency, incontinence.  Outpatient Encounter Prescriptions as of 03/15/2013  Medication Sig Dispense Refill  . amiodarone (PACERONE) 200 MG tablet Take 1 tablet (200 mg total) by mouth daily.  90 tablet  3  . clopidogrel (PLAVIX) 75 MG tablet TAKE 1 TABLET DAILY  90 tablet  3  . diltiazem (CARDIZEM CD) 240 MG 24 hr capsule Take 1 capsule (240 mg total) by mouth daily.  90 capsule  3  . fenofibrate micronized (ANTARA) 130 MG capsule TAKE 1 CAPSULE DAILY BEFORE BREAKFAST  90 capsule  3  . finasteride (PROSCAR) 5 MG tablet Take 5 mg by mouth daily.        Marland Kitchen KRILL OIL PO Take by mouth 2 (two) times daily.      . metoprolol (LOPRESSOR) 50  MG tablet TAKE ONE-HALF (1/2) TABLET (25 MG TOTAL) TWICE A DAY  90 tablet  1  . NEXIUM 40 MG capsule TAKE 1 CAPSULE DAILY  90 capsule  3  . nitroGLYCERIN (NITROSTAT) 0.4 MG SL tablet Place 1 tablet (0.4 mg total) under the tongue every 5 (five) minutes as needed for chest pain.  25 tablet  6  . Tamsulosin HCl (FLOMAX) 0.4 MG CAPS Take 0.4 mg by mouth daily.      Marland Kitchen VITAMIN D, CHOLECALCIFEROL, PO Take by mouth.      Carlena Hurl 20 MG TABS TAKE 1 TABLET (20 MG TOTAL) DAILY  90 tablet  2  . [DISCONTINUED] pravastatin (PRAVACHOL) 40 MG tablet TAKE 1 TABLET AT BEDTIME  90 tablet  1  . levobunolol (BETAGAN) 0.5 % ophthalmic solution 1 drop 2 (two) times daily.        No facility-administered encounter medications on file as of 03/15/2013.   BP 120/68  Pulse 59  Temp(Src) 97.8 F (36.6 C) (Oral)  Resp 12  Ht 6' 4.5" (1.943 m)  Wt 226 lb (102.513 kg)  BMI 27.15 kg/m2  SpO2 96%  Review of Systems  Constitutional: Negative for fever, chills, activity change, appetite change, fatigue and unexpected weight change.  Eyes: Negative for visual disturbance.  Respiratory: Negative for cough and shortness of breath.   Cardiovascular: Negative for chest pain, palpitations and leg swelling.  Gastrointestinal: Negative for abdominal pain and abdominal distention.  Genitourinary: Negative for dysuria, urgency and difficulty urinating.  Musculoskeletal: Negative for arthralgias and gait problem.  Skin: Negative for color change and rash.  Hematological: Negative for adenopathy.  Psychiatric/Behavioral: Positive for sleep disturbance. Negative for dysphoric mood. The patient is not nervous/anxious.        Objective:   Physical Exam  Constitutional: He is oriented to person, place, and time. He appears well-developed and well-nourished. No distress.  HENT:  Head: Normocephalic and atraumatic.  Right Ear: External ear normal.  Left Ear: External ear normal.  Nose: Nose normal.  Mouth/Throat: Oropharynx is clear and moist. No oropharyngeal exudate.  Eyes: Conjunctivae and EOM are normal. Pupils are equal, round, and reactive to light. Right eye exhibits no discharge. Left eye exhibits no discharge. No scleral icterus.  Neck: Normal range of motion. Neck supple. No tracheal deviation present. No thyromegaly present.  Cardiovascular: Normal rate, regular rhythm and normal heart sounds.  Exam reveals no gallop and no  friction rub.   No murmur heard. Pulmonary/Chest: Effort normal and breath sounds normal. No respiratory distress. He has no wheezes. He has no rales. He exhibits no tenderness.  Abdominal: Soft. Bowel sounds are normal. He exhibits no distension and no mass. There is no tenderness. There is no rebound and no guarding.  Musculoskeletal: Normal range of motion. He exhibits no edema.  Lymphadenopathy:    He has no cervical adenopathy.  Neurological: He is alert and oriented to person, place, and time. No cranial nerve deficit. Coordination normal.  Skin: Skin is warm and dry. No rash noted. He is not diaphoretic. No erythema. No pallor.  Psychiatric: He has a normal mood and affect. His behavior is normal. Judgment and thought content normal.          Assessment & Plan:

## 2013-03-15 NOTE — Assessment & Plan Note (Signed)
General medical exam normal today. Appropriate screening performed. Will request records from his urologist regarding PSA testing. Will check labs today including CMP, lipid profile. EKG normal today. Encouraged continued efforts at healthy diet and regular physical activity.

## 2013-03-15 NOTE — Assessment & Plan Note (Signed)
Will set up follow up CT chest for evaluation.

## 2013-03-15 NOTE — Assessment & Plan Note (Signed)
Will request records on treatment plan and recent PSA.

## 2013-03-15 NOTE — Assessment & Plan Note (Signed)
Symptoms of insomnia are persistent. He would like to try melatonin. If no improvement with this, will plan to restart low dose of Ambien.

## 2013-03-16 DIAGNOSIS — M999 Biomechanical lesion, unspecified: Secondary | ICD-10-CM | POA: Diagnosis not present

## 2013-03-16 DIAGNOSIS — IMO0002 Reserved for concepts with insufficient information to code with codable children: Secondary | ICD-10-CM | POA: Diagnosis not present

## 2013-03-16 DIAGNOSIS — M5137 Other intervertebral disc degeneration, lumbosacral region: Secondary | ICD-10-CM | POA: Diagnosis not present

## 2013-03-22 ENCOUNTER — Ambulatory Visit: Payer: Self-pay | Admitting: Internal Medicine

## 2013-03-22 DIAGNOSIS — I2510 Atherosclerotic heart disease of native coronary artery without angina pectoris: Secondary | ICD-10-CM | POA: Diagnosis not present

## 2013-03-22 DIAGNOSIS — J984 Other disorders of lung: Secondary | ICD-10-CM | POA: Diagnosis not present

## 2013-03-22 DIAGNOSIS — R942 Abnormal results of pulmonary function studies: Secondary | ICD-10-CM | POA: Diagnosis not present

## 2013-03-22 DIAGNOSIS — R911 Solitary pulmonary nodule: Secondary | ICD-10-CM | POA: Diagnosis not present

## 2013-03-23 ENCOUNTER — Telehealth: Payer: Self-pay | Admitting: Internal Medicine

## 2013-03-23 NOTE — Telephone Encounter (Signed)
CT of the Chest showed stable tiny nodules in the lungs, maximal size of 3mm. No follow up needed.

## 2013-03-24 DIAGNOSIS — N139 Obstructive and reflux uropathy, unspecified: Secondary | ICD-10-CM | POA: Diagnosis not present

## 2013-03-24 DIAGNOSIS — R339 Retention of urine, unspecified: Secondary | ICD-10-CM | POA: Diagnosis not present

## 2013-03-24 DIAGNOSIS — C61 Malignant neoplasm of prostate: Secondary | ICD-10-CM | POA: Diagnosis not present

## 2013-03-24 DIAGNOSIS — N320 Bladder-neck obstruction: Secondary | ICD-10-CM | POA: Insufficient documentation

## 2013-03-25 DIAGNOSIS — N183 Chronic kidney disease, stage 3 unspecified: Secondary | ICD-10-CM | POA: Diagnosis not present

## 2013-03-25 DIAGNOSIS — I10 Essential (primary) hypertension: Secondary | ICD-10-CM | POA: Diagnosis not present

## 2013-04-02 ENCOUNTER — Encounter: Payer: Self-pay | Admitting: Internal Medicine

## 2013-04-12 DIAGNOSIS — M999 Biomechanical lesion, unspecified: Secondary | ICD-10-CM | POA: Diagnosis not present

## 2013-04-12 DIAGNOSIS — IMO0002 Reserved for concepts with insufficient information to code with codable children: Secondary | ICD-10-CM | POA: Diagnosis not present

## 2013-04-12 DIAGNOSIS — M5137 Other intervertebral disc degeneration, lumbosacral region: Secondary | ICD-10-CM | POA: Diagnosis not present

## 2013-04-23 DIAGNOSIS — Z85828 Personal history of other malignant neoplasm of skin: Secondary | ICD-10-CM | POA: Diagnosis not present

## 2013-04-23 DIAGNOSIS — L821 Other seborrheic keratosis: Secondary | ICD-10-CM | POA: Diagnosis not present

## 2013-04-23 DIAGNOSIS — L570 Actinic keratosis: Secondary | ICD-10-CM | POA: Diagnosis not present

## 2013-04-30 ENCOUNTER — Other Ambulatory Visit: Payer: Self-pay | Admitting: Internal Medicine

## 2013-05-10 DIAGNOSIS — IMO0002 Reserved for concepts with insufficient information to code with codable children: Secondary | ICD-10-CM | POA: Diagnosis not present

## 2013-05-10 DIAGNOSIS — M5137 Other intervertebral disc degeneration, lumbosacral region: Secondary | ICD-10-CM | POA: Diagnosis not present

## 2013-05-10 DIAGNOSIS — M999 Biomechanical lesion, unspecified: Secondary | ICD-10-CM | POA: Diagnosis not present

## 2013-05-19 ENCOUNTER — Ambulatory Visit (INDEPENDENT_AMBULATORY_CARE_PROVIDER_SITE_OTHER): Payer: Medicare Other

## 2013-05-19 DIAGNOSIS — Z23 Encounter for immunization: Secondary | ICD-10-CM | POA: Diagnosis not present

## 2013-06-07 DIAGNOSIS — IMO0002 Reserved for concepts with insufficient information to code with codable children: Secondary | ICD-10-CM | POA: Diagnosis not present

## 2013-06-07 DIAGNOSIS — M5137 Other intervertebral disc degeneration, lumbosacral region: Secondary | ICD-10-CM | POA: Diagnosis not present

## 2013-06-07 DIAGNOSIS — M999 Biomechanical lesion, unspecified: Secondary | ICD-10-CM | POA: Diagnosis not present

## 2013-06-10 ENCOUNTER — Other Ambulatory Visit: Payer: Self-pay | Admitting: Cardiovascular Disease

## 2013-06-10 ENCOUNTER — Other Ambulatory Visit: Payer: Self-pay | Admitting: *Deleted

## 2013-06-10 MED ORDER — AMIODARONE HCL 200 MG PO TABS: 200.0000 mg | ORAL_TABLET | Freq: Every day | ORAL | Status: DC

## 2013-06-10 NOTE — Telephone Encounter (Signed)
Requested Prescriptions   Signed Prescriptions Disp Refills  . amiodarone (PACERONE) 200 MG tablet 90 tablet 3    Sig: Take 1 tablet (200 mg total) by mouth daily.    Authorizing Provider: GOLLAN, TIMOTHY J    Ordering User: LOPEZ, MARINA C    

## 2013-06-14 DIAGNOSIS — H4010X0 Unspecified open-angle glaucoma, stage unspecified: Secondary | ICD-10-CM | POA: Diagnosis not present

## 2013-07-05 DIAGNOSIS — IMO0002 Reserved for concepts with insufficient information to code with codable children: Secondary | ICD-10-CM | POA: Diagnosis not present

## 2013-07-05 DIAGNOSIS — M1710 Unilateral primary osteoarthritis, unspecified knee: Secondary | ICD-10-CM | POA: Diagnosis not present

## 2013-07-06 DIAGNOSIS — L570 Actinic keratosis: Secondary | ICD-10-CM | POA: Diagnosis not present

## 2013-07-09 DIAGNOSIS — IMO0002 Reserved for concepts with insufficient information to code with codable children: Secondary | ICD-10-CM | POA: Diagnosis not present

## 2013-07-09 DIAGNOSIS — M999 Biomechanical lesion, unspecified: Secondary | ICD-10-CM | POA: Diagnosis not present

## 2013-07-09 DIAGNOSIS — M5137 Other intervertebral disc degeneration, lumbosacral region: Secondary | ICD-10-CM | POA: Diagnosis not present

## 2013-07-16 DIAGNOSIS — IMO0002 Reserved for concepts with insufficient information to code with codable children: Secondary | ICD-10-CM | POA: Diagnosis not present

## 2013-07-16 DIAGNOSIS — M9981 Other biomechanical lesions of cervical region: Secondary | ICD-10-CM | POA: Diagnosis not present

## 2013-07-16 DIAGNOSIS — M999 Biomechanical lesion, unspecified: Secondary | ICD-10-CM | POA: Diagnosis not present

## 2013-07-16 DIAGNOSIS — M5412 Radiculopathy, cervical region: Secondary | ICD-10-CM | POA: Diagnosis not present

## 2013-07-21 DIAGNOSIS — M999 Biomechanical lesion, unspecified: Secondary | ICD-10-CM | POA: Diagnosis not present

## 2013-07-21 DIAGNOSIS — IMO0002 Reserved for concepts with insufficient information to code with codable children: Secondary | ICD-10-CM | POA: Diagnosis not present

## 2013-07-21 DIAGNOSIS — M5137 Other intervertebral disc degeneration, lumbosacral region: Secondary | ICD-10-CM | POA: Diagnosis not present

## 2013-07-23 DIAGNOSIS — M5137 Other intervertebral disc degeneration, lumbosacral region: Secondary | ICD-10-CM | POA: Diagnosis not present

## 2013-07-23 DIAGNOSIS — IMO0002 Reserved for concepts with insufficient information to code with codable children: Secondary | ICD-10-CM | POA: Diagnosis not present

## 2013-07-23 DIAGNOSIS — M999 Biomechanical lesion, unspecified: Secondary | ICD-10-CM | POA: Diagnosis not present

## 2013-08-02 DIAGNOSIS — M999 Biomechanical lesion, unspecified: Secondary | ICD-10-CM | POA: Diagnosis not present

## 2013-08-02 DIAGNOSIS — M5137 Other intervertebral disc degeneration, lumbosacral region: Secondary | ICD-10-CM | POA: Diagnosis not present

## 2013-08-02 DIAGNOSIS — IMO0002 Reserved for concepts with insufficient information to code with codable children: Secondary | ICD-10-CM | POA: Diagnosis not present

## 2013-08-16 ENCOUNTER — Ambulatory Visit: Payer: Medicare Other | Admitting: Cardiovascular Disease

## 2013-08-17 DIAGNOSIS — L570 Actinic keratosis: Secondary | ICD-10-CM | POA: Diagnosis not present

## 2013-08-19 HISTORY — PX: COLONOSCOPY W/ POLYPECTOMY: SHX1380

## 2013-08-21 ENCOUNTER — Other Ambulatory Visit: Payer: Self-pay | Admitting: Internal Medicine

## 2013-08-23 ENCOUNTER — Ambulatory Visit (INDEPENDENT_AMBULATORY_CARE_PROVIDER_SITE_OTHER): Payer: Medicare Other | Admitting: Cardiovascular Disease

## 2013-08-23 ENCOUNTER — Encounter: Payer: Self-pay | Admitting: Cardiovascular Disease

## 2013-08-23 VITALS — BP 137/84 | HR 54 | Ht 76.0 in | Wt 227.2 lb

## 2013-08-23 DIAGNOSIS — I2510 Atherosclerotic heart disease of native coronary artery without angina pectoris: Secondary | ICD-10-CM | POA: Diagnosis not present

## 2013-08-23 DIAGNOSIS — I10 Essential (primary) hypertension: Secondary | ICD-10-CM | POA: Diagnosis not present

## 2013-08-23 DIAGNOSIS — R0602 Shortness of breath: Secondary | ICD-10-CM

## 2013-08-23 DIAGNOSIS — E785 Hyperlipidemia, unspecified: Secondary | ICD-10-CM

## 2013-08-23 DIAGNOSIS — I4891 Unspecified atrial fibrillation: Secondary | ICD-10-CM

## 2013-08-23 NOTE — Assessment & Plan Note (Signed)
Blood pressure is well controlled on today's visit. No changes made to the medications. 

## 2013-08-23 NOTE — Assessment & Plan Note (Signed)
Maintaining normal sinus rhythm. No recent symptoms concerning for arrhythmia.  No changes to his medications

## 2013-08-23 NOTE — Patient Instructions (Signed)
Your physician recommends that you continue on your current medications as directed. Please refer to the Current Medication list given to you today.  Your physician wants you to follow-up in: 6 months. You will receive a reminder letter in the mail two months in advance. If you don't receive a letter, please call our office to schedule the follow-up appointment.  

## 2013-08-23 NOTE — Assessment & Plan Note (Signed)
Cholesterol is at goal on the current lipid regimen. No changes to the medications were made.  

## 2013-08-23 NOTE — Progress Notes (Signed)
Patient ID: William Taylor, male    DOB: 05-Jun-1930, 80 y.o.   MRN: [id number]  HPI Comments: Mr. Colglazier is a very pleasant 80 year old gentleman with a history of coronary artery disease, stent in 2005, repeat catheterization in 2009 showing patent stent with no other significant disease, normal systolic function in September 2011 who presented to the emergency room September 2011 with malaise, atrial fibrillation with RVR in the setting of a urinary tract infection, recent admission to the hospital November 14 2010 for atrial fibrillation that developed after pushing mowing with some chest pain. He converted back to normal sinus rhythm, Presenting to [facility] with chest discomfort with cardiac catheterization Dec 25 2010, showing 90% OM 2 disease, transferred to [facility]  with DES stent placed, 2.25 x 16 mm POMUS stent, with flank pain developing after discharge, admitted to [facility] and diagnosed with pulmonary embolism with heavy clot burden.   Found to be in atrial fibrillation in 2013. He had symptoms of shortness of breath with exertion.  After one month on xarelto, amiodarone load and titration downward was started with conversion to NSR in October 2013  EKG the end of October 2013 confirmed normal sinus rhythm.   History of back surgery in 2008, history of prostate cancer followed by Dr. Jacqlyn Larsen   Overall today he reports doing well. He reports that he is doing well, some stress at home as he has a family member with mild dementia living with them. He is not walking as much. Wife has back problems Denies any significant lower extremity edema. Overall no changes. Denies any palpitations or tachycardia concerning for atrial fibrillation. Has continued on anticoagulation.   EKG shows normal sinus rhythm with rate 54 beats per minute with no significant ST or T wave changes   Outpatient Encounter Prescriptions as of 08/23/2013  Medication Sig  . amiodarone (PACERONE) 200 MG tablet Take 1 tablet (200 mg  total) by mouth daily.  . clopidogrel (PLAVIX) 75 MG tablet TAKE 1 TABLET DAILY  . diltiazem (CARDIZEM CD) 240 MG 24 hr capsule Take 1 capsule (240 mg total) by mouth daily.  . fenofibrate micronized (ANTARA) 130 MG capsule TAKE 1 CAPSULE DAILY BEFORE BREAKFAST  . finasteride (PROSCAR) 5 MG tablet Take 5 mg by mouth daily.    Marland Kitchen KRILL OIL PO Take by mouth 2 (two) times daily.  Marland Kitchen levobunolol (BETAGAN) 0.5 % ophthalmic solution 1 drop 2 (two) times daily.   . metoprolol (LOPRESSOR) 50 MG tablet TAKE ONE-HALF (1/2) TABLET (25 MG) TWICE A DAY  . NEXIUM 40 MG capsule TAKE 1 CAPSULE DAILY  . nitroGLYCERIN (NITROSTAT) 0.4 MG SL tablet Place 1 tablet (0.4 mg total) under the tongue every 5 (five) minutes as needed for chest pain.  . pravastatin (PRAVACHOL) 40 MG tablet TAKE 1 TABLET AT BEDTIME  . Tamsulosin HCl (FLOMAX) 0.4 MG CAPS Take 0.4 mg by mouth daily.  Marland Kitchen VITAMIN D, CHOLECALCIFEROL, PO Take by mouth.  Alveda Reasons 20 MG TABS TAKE 1 TABLET (20 MG TOTAL) DAILY  . [DISCONTINUED] amiodarone (PACERONE) 200 MG tablet TAKE 1 TABLET DAILY    Review of Systems  Constitutional: Negative.   HENT: Negative.   Eyes: Negative.   Respiratory: Negative.   Cardiovascular: Negative.   Gastrointestinal: Negative.   Musculoskeletal: Positive for back pain.  Skin: Negative.   Neurological: Negative.   Psychiatric/Behavioral: Negative.   All other systems reviewed and are negative.   BP 137/84  Pulse 54  Ht 6\' 4"  (1.93  m)  Wt 227 lb 4 oz (103.08 kg)  BMI 27.67 kg/m2  Physical Exam  Nursing note and vitals reviewed. Constitutional: He is oriented to person, place, and time. He appears well-developed and well-nourished.  HENT:  Head: Normocephalic.  Nose: Nose normal.  Mouth/Throat: Oropharynx is clear and moist.  Eyes: Conjunctivae are normal. Pupils are equal, round, and reactive to light.  Neck: Normal range of motion. Neck supple. No JVD present.  Cardiovascular: Normal rate, S1 normal, S2  normal, normal heart sounds and intact distal pulses.  An irregularly irregular rhythm present. Exam reveals no gallop and no friction rub.   No murmur heard. Pulmonary/Chest: Effort normal and breath sounds normal. No respiratory distress. He has no wheezes. He has no rales. He exhibits no tenderness.  Abdominal: Soft. Bowel sounds are normal. He exhibits no distension. There is no tenderness.  Musculoskeletal: Normal range of motion. He exhibits no edema and no tenderness.  Lymphadenopathy:    He has no cervical adenopathy.  Neurological: He is alert and oriented to person, place, and time. Coordination normal.  Skin: Skin is warm and dry. No rash noted. No erythema.  Psychiatric: He has a normal mood and affect. His behavior is normal. Judgment and thought content normal.      Assessment and Plan

## 2013-08-23 NOTE — Assessment & Plan Note (Signed)
Currently with no symptoms of angina. No further workup at this time. Continue current medication regimen. 

## 2013-08-30 DIAGNOSIS — M999 Biomechanical lesion, unspecified: Secondary | ICD-10-CM | POA: Diagnosis not present

## 2013-08-30 DIAGNOSIS — M5030 Other cervical disc degeneration, unspecified cervical region: Secondary | ICD-10-CM | POA: Diagnosis not present

## 2013-08-30 DIAGNOSIS — IMO0002 Reserved for concepts with insufficient information to code with codable children: Secondary | ICD-10-CM | POA: Diagnosis not present

## 2013-08-30 DIAGNOSIS — M9981 Other biomechanical lesions of cervical region: Secondary | ICD-10-CM | POA: Diagnosis not present

## 2013-08-30 DIAGNOSIS — M5137 Other intervertebral disc degeneration, lumbosacral region: Secondary | ICD-10-CM | POA: Diagnosis not present

## 2013-09-11 ENCOUNTER — Other Ambulatory Visit: Payer: Self-pay | Admitting: Internal Medicine

## 2013-09-15 ENCOUNTER — Ambulatory Visit: Payer: Medicare Other | Admitting: Internal Medicine

## 2013-09-23 ENCOUNTER — Ambulatory Visit (INDEPENDENT_AMBULATORY_CARE_PROVIDER_SITE_OTHER): Payer: Medicare Other | Admitting: Internal Medicine

## 2013-09-23 ENCOUNTER — Encounter: Payer: Self-pay | Admitting: Internal Medicine

## 2013-09-23 VITALS — BP 134/78 | HR 72 | Temp 98.0°F | Wt 225.0 lb

## 2013-09-23 DIAGNOSIS — K644 Residual hemorrhoidal skin tags: Secondary | ICD-10-CM

## 2013-09-23 DIAGNOSIS — R1032 Left lower quadrant pain: Secondary | ICD-10-CM | POA: Diagnosis not present

## 2013-09-23 DIAGNOSIS — E785 Hyperlipidemia, unspecified: Secondary | ICD-10-CM

## 2013-09-23 DIAGNOSIS — I10 Essential (primary) hypertension: Secondary | ICD-10-CM

## 2013-09-23 DIAGNOSIS — N183 Chronic kidney disease, stage 3 unspecified: Secondary | ICD-10-CM

## 2013-09-23 DIAGNOSIS — I4891 Unspecified atrial fibrillation: Secondary | ICD-10-CM

## 2013-09-23 LAB — CBC WITH DIFFERENTIAL/PLATELET
BASOS PCT: 0.4 % (ref 0.0–3.0)
Basophils Absolute: 0 10*3/uL (ref 0.0–0.1)
EOS ABS: 0 10*3/uL (ref 0.0–0.7)
Eosinophils Relative: 0.5 % (ref 0.0–5.0)
HCT: 36.2 % — ABNORMAL LOW (ref 39.0–52.0)
HEMOGLOBIN: 13 g/dL (ref 13.0–17.0)
LYMPHS PCT: 12.9 % (ref 12.0–46.0)
Lymphs Abs: 1.2 10*3/uL (ref 0.7–4.0)
MCHC: 35.9 g/dL (ref 30.0–36.0)
MCV: 101.7 fl — ABNORMAL HIGH (ref 78.0–100.0)
MONOS PCT: 8.5 % (ref 3.0–12.0)
Monocytes Absolute: 0.8 10*3/uL (ref 0.1–1.0)
NEUTROS ABS: 6.9 10*3/uL (ref 1.4–7.7)
NEUTROS PCT: 77.7 % — AB (ref 43.0–77.0)
Platelets: 185 10*3/uL (ref 150.0–400.0)
RBC: 3.56 Mil/uL — ABNORMAL LOW (ref 4.22–5.81)
RDW: 14 % (ref 11.5–14.6)
WBC: 8.9 10*3/uL (ref 4.5–10.5)

## 2013-09-23 LAB — COMPREHENSIVE METABOLIC PANEL
ALBUMIN: 4.1 g/dL (ref 3.5–5.2)
ALT: 20 U/L (ref 0–53)
AST: 24 U/L (ref 0–37)
Alkaline Phosphatase: 32 U/L — ABNORMAL LOW (ref 39–117)
BUN: 19 mg/dL (ref 6–23)
CALCIUM: 9.8 mg/dL (ref 8.4–10.5)
CHLORIDE: 104 meq/L (ref 96–112)
CO2: 27 meq/L (ref 19–32)
Creatinine, Ser: 1.3 mg/dL (ref 0.4–1.5)
GFR: 55.22 mL/min — AB (ref >60.00)
Glucose, Bld: 112 mg/dL — ABNORMAL HIGH (ref 70–99)
POTASSIUM: 4.4 meq/L (ref 3.5–5.1)
Sodium: 138 mEq/L (ref 135–145)
Total Bilirubin: 0.9 mg/dL (ref 0.3–1.2)
Total Protein: 6.9 g/dL (ref 6.0–8.3)

## 2013-09-23 MED ORDER — DILTIAZEM HCL ER COATED BEADS 240 MG PO CP24: ORAL_CAPSULE | ORAL | Status: DC

## 2013-09-23 MED ORDER — FENOFIBRATE MICRONIZED 130 MG PO CAPS: ORAL_CAPSULE | ORAL | Status: DC

## 2013-09-23 MED ORDER — HYDROCORTISONE ACE-PRAMOXINE 2.5-1 % RE CREA: 1.0000 "application " | TOPICAL_CREAM | Freq: Three times a day (TID) | RECTAL | Status: DC

## 2013-09-23 MED ORDER — METOPROLOL TARTRATE 50 MG PO TABS: ORAL_TABLET | ORAL | Status: DC

## 2013-09-23 NOTE — Assessment & Plan Note (Signed)
Will check LFTs with labs today. 

## 2013-09-23 NOTE — Assessment & Plan Note (Signed)
Will check renal function with labs today. 

## 2013-09-23 NOTE — Assessment & Plan Note (Signed)
Symptoms are most consistent with mild diverticulitis. Given his history of life-threatening hemorrhage with diverticulitis, will get repeat CT scan of the abdomen and pelvis for further evaluation. We'll also check CBC and CMP with labs today. Encouraged a low residue diet. Given that symptoms are mild, will hold off until CT results back prior to consideration of adding antibiotics.

## 2013-09-23 NOTE — Progress Notes (Signed)
Pre-visit discussion using our clinic review tool. No additional management support is needed unless otherwise documented below in the visit note.  

## 2013-09-23 NOTE — Progress Notes (Signed)
**Note William-Identified via Obfuscation** Subjective:    Patient ID: William Taylor, male    DOB: February 25, 1930, 80 y.o.   MRN: [id number]  HPI 80YO male with h/o atrial fibrillation, hypertension, hyperlipidemia and CKD presents for follow up.  Concerned about right lower abdominal pain, started about 2 weeks ago. Radiates to left lower abdomen. Comes and goes. Described as burning pain. Improves with BM. No blood in stool, no black stool. No NVD. No fever chills. No blood in urine. No urinary symptoms. Previous h/o diverticulitis 1993.  Also concerned about hemorrhoids. Feels like "sitting on a basketball." No blood in stool or on tissue. Not using any topical medication.  Review of Systems  Constitutional: Negative for fever, chills, activity change, appetite change, fatigue and unexpected weight change.  Eyes: Negative for visual disturbance.  Respiratory: Negative for cough and shortness of breath.   Cardiovascular: Negative for chest pain, palpitations and leg swelling.  Gastrointestinal: Positive for abdominal pain and rectal pain. Negative for nausea, vomiting, diarrhea, constipation, blood in stool and abdominal distention.  Genitourinary: Negative for dysuria, urgency and difficulty urinating.  Musculoskeletal: Negative for arthralgias and gait problem.  Skin: Negative for color change and rash.  Hematological: Negative for adenopathy.  Psychiatric/Behavioral: Negative for sleep disturbance and dysphoric mood. The patient is not nervous/anxious.        Objective:    BP 134/78  Pulse 72  Temp(Src) 98 F (36.7 C) (Oral)  Wt 225 lb (102.059 kg)  SpO2 97% Physical Exam  Constitutional: He is oriented to person, place, and time. He appears well-developed and well-nourished. No distress.  HENT:  Head: Normocephalic and atraumatic.  Right Ear: External ear normal.  Left Ear: External ear normal.  Nose: Nose normal.  Mouth/Throat: Oropharynx is clear and moist. No oropharyngeal exudate.  Eyes: Conjunctivae and EOM  are normal. Pupils are equal, round, and reactive to light. Right eye exhibits no discharge. Left eye exhibits no discharge. No scleral icterus.  Neck: Normal range of motion. Neck supple. No tracheal deviation present. No thyromegaly present.  Cardiovascular: Normal rate, regular rhythm and normal heart sounds.  Exam reveals no gallop and no friction rub.   No murmur heard. Pulmonary/Chest: Effort normal and breath sounds normal. No accessory muscle usage. Not tachypneic. No respiratory distress. He has no decreased breath sounds. He has no wheezes. He has no rhonchi. He has no rales. He exhibits no tenderness.  Abdominal: Soft. Bowel sounds are normal. He exhibits no distension and no mass. There is no tenderness. There is no rebound and no guarding.  Genitourinary: Rectal exam shows external hemorrhoid (3 small external hemorrhoids, non-thrombosed). Rectal exam shows no tenderness.  Musculoskeletal: Normal range of motion. He exhibits no edema.  Lymphadenopathy:    He has no cervical adenopathy.  Neurological: He is alert and oriented to person, place, and time. No cranial nerve deficit. Coordination normal.  Skin: Skin is warm and dry. No rash noted. He is not diaphoretic. No erythema. No pallor.  Psychiatric: He has a normal mood and affect. His behavior is normal. Judgment and thought content normal.          Assessment & Plan:   Problem List Items Addressed This Visit   Abdominal pain, left lower quadrant - Primary     Symptoms are most consistent with mild diverticulitis. Given his history of life-threatening hemorrhage with diverticulitis, will get repeat CT scan of the abdomen and pelvis for further evaluation. We'll also check CBC and CMP with labs today. Encouraged a low  residue diet. Given that symptoms are mild, will hold off until CT results back prior to consideration of adding antibiotics.    Relevant Orders      CBC with Differential      Comprehensive metabolic panel       POCT urinalysis dipstick      CT Abdomen Pelvis W Contrast   ATRIAL FIBRILLATION     Symptomatically doing well. Rate well controlled with metoprolol and diltiazem. Anticoagulated with Xarelto.    Relevant Medications      fenofibrate micronized (ANTARA) capsule      metoprolol (LOPRESSOR) tablet      diltiazem (CARDIZEM CD) 24 hr capsule   Chronic kidney disease (CKD), stage III (moderate)     Will check renal function with labs today.    External hemorrhoids     External hemorrhoids noted on exam. Nonthrombosed. Encouraged him to use premoistened toilet paper. Will start Analpram. Encouraged efforts to keep bowel movements soft. He will call if symptoms are persistent. Discussed potential referral to general surgery for banding or hemorrhoidectomy if symptoms are persistent.    Relevant Medications      ANALPRAM-HC 1-2.5 % RE CREA   HYPERLIPIDEMIA-MIXED     Will check LFTs with labs today.    Hypertension      BP Readings from Last 3 Encounters:  09/23/13 134/78  08/23/13 137/84  03/15/13 120/68   BP well controlled on current medications. Will continue.        Return in about 1 week (around 09/30/2013) for Recheck.

## 2013-09-23 NOTE — Patient Instructions (Signed)
Start AnalPram applied topically to help with hemorroids.  Use pre-moistened toilet paper.  We will schedule CT of the abdomen to evaluate left lower abdominal pain.  Follow up in 1 week.

## 2013-09-23 NOTE — Assessment & Plan Note (Signed)
External hemorrhoids noted on exam. Nonthrombosed. Encouraged him to use premoistened toilet paper. Will start Analpram. Encouraged efforts to keep bowel movements soft. He will call if symptoms are persistent. Discussed potential referral to general surgery for banding or hemorrhoidectomy if symptoms are persistent.

## 2013-09-23 NOTE — Assessment & Plan Note (Signed)
Symptomatically doing well. Rate well controlled with metoprolol and diltiazem. Anticoagulated with Xarelto.

## 2013-09-23 NOTE — Assessment & Plan Note (Signed)
BP Readings from Last 3 Encounters:  09/23/13 134/78  08/23/13 137/84  03/15/13 120/68   BP well controlled on current medications. Will continue.

## 2013-09-24 ENCOUNTER — Ambulatory Visit: Payer: Self-pay | Admitting: Internal Medicine

## 2013-09-24 ENCOUNTER — Telehealth: Payer: Self-pay | Admitting: Internal Medicine

## 2013-09-24 DIAGNOSIS — N281 Cyst of kidney, acquired: Secondary | ICD-10-CM | POA: Diagnosis not present

## 2013-09-24 DIAGNOSIS — K5730 Diverticulosis of large intestine without perforation or abscess without bleeding: Secondary | ICD-10-CM | POA: Diagnosis not present

## 2013-09-24 DIAGNOSIS — N289 Disorder of kidney and ureter, unspecified: Secondary | ICD-10-CM | POA: Diagnosis not present

## 2013-09-24 DIAGNOSIS — R109 Unspecified abdominal pain: Secondary | ICD-10-CM

## 2013-09-24 DIAGNOSIS — R1032 Left lower quadrant pain: Secondary | ICD-10-CM | POA: Diagnosis not present

## 2013-09-24 DIAGNOSIS — I723 Aneurysm of iliac artery: Secondary | ICD-10-CM | POA: Diagnosis not present

## 2013-09-24 NOTE — Telephone Encounter (Signed)
Relevant patient education assigned to patient using Emmi. ° °

## 2013-09-24 NOTE — Telephone Encounter (Signed)
CT abdomen showed no acute findings to explain abdominal pain. Diverticulosis was noted but no diverticulitis. If abdominal pain persists, then I would recommend we set up follow up with GI.

## 2013-09-27 DIAGNOSIS — M9981 Other biomechanical lesions of cervical region: Secondary | ICD-10-CM | POA: Diagnosis not present

## 2013-09-27 DIAGNOSIS — IMO0002 Reserved for concepts with insufficient information to code with codable children: Secondary | ICD-10-CM | POA: Diagnosis not present

## 2013-09-27 DIAGNOSIS — M5030 Other cervical disc degeneration, unspecified cervical region: Secondary | ICD-10-CM | POA: Diagnosis not present

## 2013-09-27 DIAGNOSIS — M999 Biomechanical lesion, unspecified: Secondary | ICD-10-CM | POA: Diagnosis not present

## 2013-09-27 DIAGNOSIS — M5137 Other intervertebral disc degeneration, lumbosacral region: Secondary | ICD-10-CM | POA: Diagnosis not present

## 2013-09-27 NOTE — Telephone Encounter (Signed)
Patient returned call, was given CT results. Per patient is still having the abd pain and would like to be referred to GI

## 2013-09-27 NOTE — Telephone Encounter (Signed)
Left message to call back  

## 2013-10-07 DIAGNOSIS — Z800 Family history of malignant neoplasm of digestive organs: Secondary | ICD-10-CM | POA: Diagnosis not present

## 2013-10-07 DIAGNOSIS — R1032 Left lower quadrant pain: Secondary | ICD-10-CM | POA: Diagnosis not present

## 2013-10-07 DIAGNOSIS — R1031 Right lower quadrant pain: Secondary | ICD-10-CM | POA: Diagnosis not present

## 2013-10-07 DIAGNOSIS — Z86010 Personal history of colonic polyps: Secondary | ICD-10-CM | POA: Diagnosis not present

## 2013-10-17 HISTORY — PX: CARDIAC CATHETERIZATION: SHX172

## 2013-10-25 DIAGNOSIS — M5137 Other intervertebral disc degeneration, lumbosacral region: Secondary | ICD-10-CM | POA: Diagnosis not present

## 2013-10-25 DIAGNOSIS — M999 Biomechanical lesion, unspecified: Secondary | ICD-10-CM | POA: Diagnosis not present

## 2013-10-25 DIAGNOSIS — IMO0002 Reserved for concepts with insufficient information to code with codable children: Secondary | ICD-10-CM | POA: Diagnosis not present

## 2013-10-26 ENCOUNTER — Ambulatory Visit: Payer: Self-pay | Admitting: Gastroenterology

## 2013-10-26 ENCOUNTER — Encounter: Payer: Self-pay | Admitting: Internal Medicine

## 2013-10-26 DIAGNOSIS — R141 Gas pain: Secondary | ICD-10-CM | POA: Diagnosis not present

## 2013-10-26 DIAGNOSIS — R079 Chest pain, unspecified: Secondary | ICD-10-CM | POA: Diagnosis not present

## 2013-10-26 DIAGNOSIS — R143 Flatulence: Secondary | ICD-10-CM | POA: Diagnosis not present

## 2013-10-26 DIAGNOSIS — K644 Residual hemorrhoidal skin tags: Secondary | ICD-10-CM | POA: Diagnosis not present

## 2013-10-26 DIAGNOSIS — Z86010 Personal history of colonic polyps: Secondary | ICD-10-CM | POA: Diagnosis not present

## 2013-10-26 DIAGNOSIS — R911 Solitary pulmonary nodule: Secondary | ICD-10-CM | POA: Diagnosis not present

## 2013-10-26 DIAGNOSIS — R1032 Left lower quadrant pain: Secondary | ICD-10-CM | POA: Diagnosis not present

## 2013-10-26 DIAGNOSIS — R1031 Right lower quadrant pain: Secondary | ICD-10-CM | POA: Diagnosis not present

## 2013-10-26 DIAGNOSIS — R0789 Other chest pain: Secondary | ICD-10-CM | POA: Diagnosis not present

## 2013-10-26 DIAGNOSIS — R52 Pain, unspecified: Secondary | ICD-10-CM | POA: Diagnosis not present

## 2013-10-26 DIAGNOSIS — Z800 Family history of malignant neoplasm of digestive organs: Secondary | ICD-10-CM | POA: Diagnosis not present

## 2013-10-26 DIAGNOSIS — K5730 Diverticulosis of large intestine without perforation or abscess without bleeding: Secondary | ICD-10-CM | POA: Diagnosis not present

## 2013-10-26 DIAGNOSIS — D126 Benign neoplasm of colon, unspecified: Secondary | ICD-10-CM | POA: Diagnosis not present

## 2013-10-26 LAB — CBC
HCT: 36.7 % — ABNORMAL LOW (ref 40.0–52.0)
HGB: 12.4 g/dL — ABNORMAL LOW (ref 13.0–18.0)
MCH: 32.6 pg (ref 26.0–34.0)
MCHC: 33.8 g/dL (ref 32.0–36.0)
MCV: 97 fL (ref 80–100)
PLATELETS: 171 10*3/uL (ref 150–440)
RBC: 3.8 10*6/uL — AB (ref 4.40–5.90)
RDW: 14.3 % (ref 11.5–14.5)
WBC: 6.8 10*3/uL (ref 3.8–10.6)

## 2013-10-26 LAB — BASIC METABOLIC PANEL
ANION GAP: 5 — AB (ref 7–16)
BUN: 15 mg/dL (ref 7–18)
CREATININE: 1.29 mg/dL (ref 0.60–1.30)
Calcium, Total: 8.5 mg/dL (ref 8.5–10.1)
Chloride: 109 mmol/L — ABNORMAL HIGH (ref 98–107)
Co2: 27 mmol/L (ref 21–32)
EGFR (Non-African Amer.): 52 — ABNORMAL LOW
GFR CALC AF AMER: 60 — AB
Glucose: 117 mg/dL — ABNORMAL HIGH (ref 65–99)
Osmolality: 283 (ref 275–301)
Potassium: 3.8 mmol/L (ref 3.5–5.1)
SODIUM: 141 mmol/L (ref 136–145)

## 2013-10-26 LAB — HM COLONOSCOPY

## 2013-10-26 LAB — TROPONIN I: Troponin-I: <0.02 ng/mL

## 2013-10-27 ENCOUNTER — Inpatient Hospital Stay: Payer: Self-pay | Admitting: Internal Medicine

## 2013-10-27 DIAGNOSIS — D126 Benign neoplasm of colon, unspecified: Secondary | ICD-10-CM | POA: Diagnosis not present

## 2013-10-27 DIAGNOSIS — Z86711 Personal history of pulmonary embolism: Secondary | ICD-10-CM | POA: Diagnosis not present

## 2013-10-27 DIAGNOSIS — R52 Pain, unspecified: Secondary | ICD-10-CM | POA: Diagnosis not present

## 2013-10-27 DIAGNOSIS — I214 Non-ST elevation (NSTEMI) myocardial infarction: Secondary | ICD-10-CM | POA: Diagnosis not present

## 2013-10-27 DIAGNOSIS — R911 Solitary pulmonary nodule: Secondary | ICD-10-CM | POA: Diagnosis not present

## 2013-10-27 DIAGNOSIS — I4891 Unspecified atrial fibrillation: Secondary | ICD-10-CM | POA: Diagnosis not present

## 2013-10-27 DIAGNOSIS — Z7902 Long term (current) use of antithrombotics/antiplatelets: Secondary | ICD-10-CM | POA: Diagnosis not present

## 2013-10-27 DIAGNOSIS — Z7982 Long term (current) use of aspirin: Secondary | ICD-10-CM | POA: Diagnosis not present

## 2013-10-27 DIAGNOSIS — Z9861 Coronary angioplasty status: Secondary | ICD-10-CM | POA: Diagnosis not present

## 2013-10-27 DIAGNOSIS — R0789 Other chest pain: Secondary | ICD-10-CM | POA: Diagnosis not present

## 2013-10-27 DIAGNOSIS — I359 Nonrheumatic aortic valve disorder, unspecified: Secondary | ICD-10-CM | POA: Diagnosis not present

## 2013-10-27 DIAGNOSIS — Z7901 Long term (current) use of anticoagulants: Secondary | ICD-10-CM | POA: Diagnosis not present

## 2013-10-27 DIAGNOSIS — I10 Essential (primary) hypertension: Secondary | ICD-10-CM | POA: Diagnosis not present

## 2013-10-27 DIAGNOSIS — E785 Hyperlipidemia, unspecified: Secondary | ICD-10-CM | POA: Diagnosis not present

## 2013-10-27 DIAGNOSIS — H409 Unspecified glaucoma: Secondary | ICD-10-CM | POA: Diagnosis present

## 2013-10-27 DIAGNOSIS — Z8546 Personal history of malignant neoplasm of prostate: Secondary | ICD-10-CM | POA: Diagnosis not present

## 2013-10-27 DIAGNOSIS — K219 Gastro-esophageal reflux disease without esophagitis: Secondary | ICD-10-CM | POA: Diagnosis not present

## 2013-10-27 DIAGNOSIS — Z800 Family history of malignant neoplasm of digestive organs: Secondary | ICD-10-CM | POA: Diagnosis not present

## 2013-10-27 DIAGNOSIS — I2510 Atherosclerotic heart disease of native coronary artery without angina pectoris: Secondary | ICD-10-CM | POA: Diagnosis not present

## 2013-10-27 DIAGNOSIS — Z86718 Personal history of other venous thrombosis and embolism: Secondary | ICD-10-CM | POA: Diagnosis not present

## 2013-10-27 DIAGNOSIS — D649 Anemia, unspecified: Secondary | ICD-10-CM | POA: Diagnosis present

## 2013-10-27 DIAGNOSIS — N400 Enlarged prostate without lower urinary tract symptoms: Secondary | ICD-10-CM | POA: Diagnosis present

## 2013-10-27 LAB — CK-MB
CK-MB: 0.6 ng/mL (ref 0.5–3.6)
CK-MB: 4.6 ng/mL — AB (ref 0.5–3.6)
CK-MB: 15.2 ng/mL — ABNORMAL HIGH (ref 0.5–3.6)
CK-MB: 14.6 ng/mL — ABNORMAL HIGH (ref 0.5–3.6)

## 2013-10-27 LAB — LIPID PANEL
Cholesterol: 119 mg/dL (ref 0–200)
HDL Cholesterol: 38 mg/dL — ABNORMAL LOW (ref 40–60)
LDL CHOLESTEROL, CALC: 69 mg/dL (ref 0–100)
Triglycerides: 61 mg/dL (ref 0–200)
VLDL CHOLESTEROL, CALC: 12 mg/dL (ref 5–40)

## 2013-10-27 LAB — CBC WITH DIFFERENTIAL/PLATELET
Basophil #: 0.1 10*3/uL (ref 0.0–0.1)
Basophil %: 0.6 %
Eosinophil #: 0 10*3/uL (ref 0.0–0.7)
Eosinophil %: 0.1 %
HCT: 34.7 % — AB (ref 40.0–52.0)
HGB: 11.8 g/dL — AB (ref 13.0–18.0)
Lymphocyte #: 1.1 10*3/uL (ref 1.0–3.6)
Lymphocyte %: 12.6 %
MCH: 32.7 pg (ref 26.0–34.0)
MCHC: 34.1 g/dL (ref 32.0–36.0)
MCV: 96 fL (ref 80–100)
MONOS PCT: 4.5 %
Monocyte #: 0.4 x10 3/mm (ref 0.2–1.0)
Neutrophil #: 6.9 10*3/uL — ABNORMAL HIGH (ref 1.4–6.5)
Neutrophil %: 82.2 %
PLATELETS: 147 10*3/uL — AB (ref 150–440)
RBC: 3.62 10*6/uL — ABNORMAL LOW (ref 4.40–5.90)
RDW: 14.4 % (ref 11.5–14.5)
WBC: 8.3 10*3/uL (ref 3.8–10.6)

## 2013-10-27 LAB — TROPONIN I
TROPONIN-I: 6.2 ng/mL — AB
Troponin-I: 1 ng/mL — ABNORMAL HIGH
Troponin-I: 5.1 ng/mL — ABNORMAL HIGH

## 2013-10-27 LAB — APTT
Activated PTT: 26.2 secs (ref 23.6–35.9)
Activated PTT: 95.9 secs — ABNORMAL HIGH (ref 23.6–35.9)

## 2013-10-28 ENCOUNTER — Encounter: Payer: Self-pay | Admitting: Cardiovascular Disease

## 2013-10-28 DIAGNOSIS — E785 Hyperlipidemia, unspecified: Secondary | ICD-10-CM | POA: Diagnosis not present

## 2013-10-28 DIAGNOSIS — I214 Non-ST elevation (NSTEMI) myocardial infarction: Secondary | ICD-10-CM | POA: Diagnosis not present

## 2013-10-28 DIAGNOSIS — I10 Essential (primary) hypertension: Secondary | ICD-10-CM | POA: Diagnosis not present

## 2013-10-28 DIAGNOSIS — I359 Nonrheumatic aortic valve disorder, unspecified: Secondary | ICD-10-CM

## 2013-10-28 DIAGNOSIS — K219 Gastro-esophageal reflux disease without esophagitis: Secondary | ICD-10-CM | POA: Diagnosis not present

## 2013-10-28 DIAGNOSIS — I2510 Atherosclerotic heart disease of native coronary artery without angina pectoris: Secondary | ICD-10-CM

## 2013-10-28 HISTORY — PX: LEFT HEART CATH AND CORONARY ANGIOGRAPHY: CATH118249

## 2013-10-28 LAB — BASIC METABOLIC PANEL
ANION GAP: 5 — AB (ref 7–16)
Anion Gap: 2 — ABNORMAL LOW (ref 7–16)
BUN: 14 mg/dL (ref 7–18)
BUN: 13 mg/dL (ref 7–18)
CHLORIDE: 107 mmol/L (ref 98–107)
Calcium, Total: 8.6 mg/dL (ref 8.5–10.1)
Calcium, Total: 8.3 mg/dL — ABNORMAL LOW (ref 8.5–10.1)
Chloride: 107 mmol/L (ref 98–107)
Co2: 28 mmol/L (ref 21–32)
Co2: 30 mmol/L (ref 21–32)
Creatinine: 1.31 mg/dL — ABNORMAL HIGH (ref 0.60–1.30)
Creatinine: 1.22 mg/dL (ref 0.60–1.30)
EGFR (Non-African Amer.): 55 — ABNORMAL LOW
GFR CALC AF AMER: 59 — AB
GFR CALC NON AF AMER: 51 — AB
Glucose: 97 mg/dL (ref 65–99)
Glucose: 95 mg/dL (ref 65–99)
OSMOLALITY: 277 (ref 275–301)
Osmolality: 280 (ref 275–301)
POTASSIUM: 4 mmol/L (ref 3.5–5.1)
Potassium: 3.9 mmol/L (ref 3.5–5.1)
SODIUM: 140 mmol/L (ref 136–145)
SODIUM: 139 mmol/L (ref 136–145)

## 2013-10-28 LAB — CBC WITH DIFFERENTIAL/PLATELET
BASOS ABS: 0 10*3/uL (ref 0.0–0.1)
Basophil %: 0.5 %
EOS PCT: 1 %
Eosinophil #: 0.1 10*3/uL (ref 0.0–0.7)
HCT: 36 % — AB (ref 40.0–52.0)
HGB: 12.1 g/dL — ABNORMAL LOW (ref 13.0–18.0)
LYMPHS PCT: 29.2 %
Lymphocyte #: 2 10*3/uL (ref 1.0–3.6)
MCH: 32.2 pg (ref 26.0–34.0)
MCHC: 33.5 g/dL (ref 32.0–36.0)
MCV: 96 fL (ref 80–100)
Monocyte #: 0.7 x10 3/mm (ref 0.2–1.0)
Monocyte %: 9.9 %
NEUTROS PCT: 59.4 %
Neutrophil #: 4.2 10*3/uL (ref 1.4–6.5)
PLATELETS: 161 10*3/uL (ref 150–440)
RBC: 3.74 10*6/uL — ABNORMAL LOW (ref 4.40–5.90)
RDW: 14.4 % (ref 11.5–14.5)
WBC: 7 10*3/uL (ref 3.8–10.6)

## 2013-10-28 LAB — APTT: Activated PTT: 87.3 secs — ABNORMAL HIGH (ref 23.6–35.9)

## 2013-10-28 LAB — TROPONIN I: TROPONIN-I: 3.8 ng/mL — AB

## 2013-10-28 LAB — PATHOLOGY REPORT

## 2013-10-29 ENCOUNTER — Telehealth: Payer: Self-pay

## 2013-10-29 DIAGNOSIS — I2510 Atherosclerotic heart disease of native coronary artery without angina pectoris: Secondary | ICD-10-CM | POA: Diagnosis not present

## 2013-10-29 DIAGNOSIS — I10 Essential (primary) hypertension: Secondary | ICD-10-CM | POA: Diagnosis not present

## 2013-10-29 DIAGNOSIS — I4891 Unspecified atrial fibrillation: Secondary | ICD-10-CM | POA: Diagnosis not present

## 2013-10-29 DIAGNOSIS — I214 Non-ST elevation (NSTEMI) myocardial infarction: Secondary | ICD-10-CM | POA: Diagnosis not present

## 2013-10-29 DIAGNOSIS — K219 Gastro-esophageal reflux disease without esophagitis: Secondary | ICD-10-CM | POA: Diagnosis not present

## 2013-10-29 DIAGNOSIS — E785 Hyperlipidemia, unspecified: Secondary | ICD-10-CM | POA: Diagnosis not present

## 2013-10-29 LAB — BASIC METABOLIC PANEL
ANION GAP: 6 — AB (ref 7–16)
BUN: 11 mg/dL (ref 7–18)
Calcium, Total: 8.9 mg/dL (ref 8.5–10.1)
Chloride: 107 mmol/L (ref 98–107)
Co2: 27 mmol/L (ref 21–32)
Creatinine: 1.16 mg/dL (ref 0.60–1.30)
EGFR (African American): >60
GFR CALC NON AF AMER: 59 — AB
GLUCOSE: 92 mg/dL (ref 65–99)
Osmolality: 278 (ref 275–301)
Potassium: 3.7 mmol/L (ref 3.5–5.1)
Sodium: 140 mmol/L (ref 136–145)

## 2013-10-29 LAB — HEMOGLOBIN: HGB: 12 g/dL — ABNORMAL LOW (ref 13.0–18.0)

## 2013-10-29 LAB — APTT: Activated PTT: 27 secs (ref 23.6–35.9)

## 2013-10-29 NOTE — Telephone Encounter (Signed)
Patient contacted regarding discharge from [facility] on 10/29/13.  Patient understands to follow up with Dr. Rockey Situ on 11/03/13 at 11:15 at [facility]. Patient understands discharge instructions? yes Patient understands medications and regiment? yes Patient understands to bring all medications to this visit? yes  Pt states that his wife was out picking up his medications. Asked pt to have her call me to go over med list when she returns home.

## 2013-11-02 ENCOUNTER — Ambulatory Visit (INDEPENDENT_AMBULATORY_CARE_PROVIDER_SITE_OTHER): Payer: Medicare Other | Admitting: Internal Medicine

## 2013-11-02 ENCOUNTER — Encounter: Payer: Self-pay | Admitting: Internal Medicine

## 2013-11-02 VITALS — BP 120/76 | HR 57 | Temp 97.7°F | Wt 225.0 lb

## 2013-11-02 DIAGNOSIS — I2510 Atherosclerotic heart disease of native coronary artery without angina pectoris: Secondary | ICD-10-CM

## 2013-11-02 DIAGNOSIS — I214 Non-ST elevation (NSTEMI) myocardial infarction: Secondary | ICD-10-CM | POA: Insufficient documentation

## 2013-11-02 NOTE — Progress Notes (Signed)
Subjective:    Patient ID: William Taylor, male    DOB: 1930-01-02, 80 y.o.   MRN: [id number]  HPI 80YO male presents for hospital follow up after MI.  3/10 had colonoscopy, came home. Was working on puzzle, developed pain in left arm and chest. No dyspnea. Notes some diaphoresis. No nausea. Called 911, transported to [facility] ED.Initial troponin was negative. Had syncopal episode while in ED.  Note, had been off Plavix and Xarelto x 3 days. [facility] - had cardiac cath with [facility] and [facility]. 90% stenosis D1 noted Unable to open. Started Nitro patch and then Imdur. Chest pain improved. Had some headache with both nitro patch and Imdur. Discharged Friday. No recurrent chest pain, dyspnea. Headache improving with Imdur. Feeling well. Appetite good.   Review of Systems  Constitutional: Negative for fever, chills, activity change, appetite change, fatigue and unexpected weight change.  Eyes: Negative for visual disturbance.  Respiratory: Negative for cough and shortness of breath.   Cardiovascular: Negative for chest pain, palpitations and leg swelling.  Gastrointestinal: Negative for abdominal pain and abdominal distention.  Genitourinary: Negative for dysuria, urgency and difficulty urinating.  Musculoskeletal: Negative for arthralgias and gait problem.  Skin: Negative for color change and rash.  Neurological: Positive for headaches. Negative for weakness and light-headedness.  Hematological: Negative for adenopathy.  Psychiatric/Behavioral: Negative for sleep disturbance and dysphoric mood. The patient is not nervous/anxious.        Objective:    BP 120/76  Pulse 57  Temp(Src) 97.7 F (36.5 C) (Oral)  Wt 225 lb (102.059 kg)  SpO2 97% Physical Exam  Constitutional: He is oriented to person, place, and time. He appears well-developed and well-nourished. No distress.  HENT:  Head: Normocephalic and atraumatic.  Right Ear: External ear normal.  Left Ear: External ear normal.  Nose:  Nose normal.  Mouth/Throat: Oropharynx is clear and moist. No oropharyngeal exudate.  Eyes: Conjunctivae and EOM are normal. Pupils are equal, round, and reactive to light. Right eye exhibits no discharge. Left eye exhibits no discharge. No scleral icterus.  Neck: Normal range of motion. Neck supple. No tracheal deviation present. No thyromegaly present.  Cardiovascular: Normal rate, regular rhythm and normal heart sounds.  Exam reveals no gallop and no friction rub.   No murmur heard. Pulmonary/Chest: Effort normal and breath sounds normal. No accessory muscle usage. Not tachypneic. No respiratory distress. He has no decreased breath sounds. He has no wheezes. He has no rhonchi. He has no rales. He exhibits no tenderness.  Musculoskeletal: Normal range of motion. He exhibits no edema.  Lymphadenopathy:    He has no cervical adenopathy.  Neurological: He is alert and oriented to person, place, and time. No cranial nerve deficit. Coordination normal.  Skin: Skin is warm and dry. No rash noted. He is not diaphoretic. No erythema. No pallor.  Psychiatric: He has a normal mood and affect. His behavior is normal. Judgment and thought content normal.          Assessment & Plan:   Problem List Items Addressed This Visit   CAD, NATIVE VESSEL - Primary     Symptomatically doing well with no recurrent chest pain. Continue medical management including aspirin, pravastatin, metoprolol, lisinopril, and Imdur. Follow up with Dr. Rockey Situ as scheduled tomorrow.    Relevant Medications      isosorbide mononitrate (IMDUR) 30 MG 24 hr tablet      lisinopril (PRINIVIL,ZESTRIL) 5 MG tablet      aspirin 81 MG tablet  Return in about 4 weeks (around 11/30/2013).

## 2013-11-02 NOTE — Assessment & Plan Note (Addendum)
Symptomatically doing well with no recurrent chest pain. Continue medical management including aspirin, pravastatin, metoprolol, lisinopril, and Imdur. Follow up with Dr. Rockey Situ as scheduled tomorrow.

## 2013-11-02 NOTE — Progress Notes (Signed)
Pre visit review using our clinic review tool, if applicable. No additional management support is needed unless otherwise documented below in the visit note. 

## 2013-11-03 ENCOUNTER — Encounter: Payer: Self-pay | Admitting: Cardiovascular Disease

## 2013-11-03 ENCOUNTER — Ambulatory Visit (INDEPENDENT_AMBULATORY_CARE_PROVIDER_SITE_OTHER): Payer: Medicare Other | Admitting: Cardiovascular Disease

## 2013-11-03 VITALS — BP 128/82 | HR 56 | Ht 76.0 in | Wt 224.2 lb

## 2013-11-03 DIAGNOSIS — I4891 Unspecified atrial fibrillation: Secondary | ICD-10-CM | POA: Diagnosis not present

## 2013-11-03 DIAGNOSIS — I2510 Atherosclerotic heart disease of native coronary artery without angina pectoris: Secondary | ICD-10-CM

## 2013-11-03 DIAGNOSIS — I214 Non-ST elevation (NSTEMI) myocardial infarction: Secondary | ICD-10-CM | POA: Diagnosis not present

## 2013-11-03 DIAGNOSIS — I209 Angina pectoris, unspecified: Secondary | ICD-10-CM | POA: Insufficient documentation

## 2013-11-03 DIAGNOSIS — I10 Essential (primary) hypertension: Secondary | ICD-10-CM | POA: Diagnosis not present

## 2013-11-03 DIAGNOSIS — E785 Hyperlipidemia, unspecified: Secondary | ICD-10-CM

## 2013-11-03 MED ORDER — LISINOPRIL 5 MG PO TABS: 5.0000 mg | ORAL_TABLET | Freq: Every day | ORAL | Status: DC

## 2013-11-03 MED ORDER — ISOSORBIDE MONONITRATE ER 30 MG PO TB24: 30.0000 mg | ORAL_TABLET | Freq: Every day | ORAL | Status: DC

## 2013-11-03 NOTE — Assessment & Plan Note (Signed)
Blood pressure is well controlled on today's visit. No changes made to the medications. 

## 2013-11-03 NOTE — Assessment & Plan Note (Signed)
Maintaining normal sinus rhythm. We'll continue anticoagulation. Amiodarone held for bradycardia

## 2013-11-03 NOTE — Progress Notes (Signed)
Patient ID: William Taylor, male    DOB: Oct 08, 1929, 80 y.o.   MRN: [id number]  HPI Comments: William Taylor is a very pleasant 80 year old gentleman with a history of coronary artery disease, stent in 2005, repeat catheterization in 2009 showing patent stent with no other significant disease, normal systolic function in September 2011 who presented to the emergency room September 2011 with malaise, atrial fibrillation with RVR in the setting of a urinary tract infection, recent admission to the hospital November 14 2010 for atrial fibrillation that developed after pushing mowing with some chest pain. He converted back to normal sinus rhythm, Presenting to [facility] with chest discomfort with cardiac catheterization Dec 25 2010, showing 90% OM 2 disease, transferred to [facility]  with DES stent placed, 2.25 x 16 mm POMUS stent, with flank pain developing after discharge, admitted to [facility] and diagnosed with pulmonary embolism with heavy clot burden.   Found to be in atrial fibrillation in 2013. He had symptoms of shortness of breath with exertion.  After one month on xarelto, amiodarone load and titration downward was started with conversion to NSR in October 2013  EKG the end of October 2013 confirmed normal sinus rhythm.   History of back surgery in 2008, history of prostate cancer followed by Dr. Jacqlyn Larsen   He was admitted to the hospital 10/27/2013 with chest pain. He had held his Plavix for colonoscopy 10/26/2013 with polypectomy x3. Cardiac catheterization performed given his severe chest pain and no disease showing severe small vessel disease, patent stents Amiodarone was held for bradycardia, he was started on isosorbide 30 mg daily, Plavix was held, he was continued on aspirin and xarelto. Heart rate at home has continued to run low in the mid 50s. He is asymptomatic  Cardiac catheterization results; 90% disease at the ostium of a small diagonal vessel, small OM vessel with 80% disease in the proximal  region Otherwise stents are patent  Echocardiogram 10/28/2013 shows normal LV function, mildly dilated left atrium, mild aortic regurg, normal right ventricular systolic pressures   EKG shows normal sinus rhythm with rate 56 beats per minute with no significant ST or T wave changes   Outpatient Encounter Prescriptions as of 11/03/2013  Medication Sig  . aspirin 81 MG tablet Take 81 mg by mouth daily.  Marland Kitchen diltiazem (CARDIZEM CD) 240 MG 24 hr capsule TAKE 1 CAPSULE DAILY  . fenofibrate micronized (ANTARA) 130 MG capsule TAKE 1 CAPSULE DAILY BEFORE BREAKFAST  . finasteride (PROSCAR) 5 MG tablet Take 5 mg by mouth daily.    . hydrocortisone-pramoxine (ANALPRAM-HC) 2.5-1 % rectal cream Place 1 application rectally 3 (three) times daily.  . isosorbide mononitrate (IMDUR) 30 MG 24 hr tablet Take 30 mg by mouth daily.   Marland Kitchen levobunolol (BETAGAN) 0.5 % ophthalmic solution 1 drop 2 (two) times daily.   Marland Kitchen lisinopril (PRINIVIL,ZESTRIL) 5 MG tablet Take 5 mg by mouth daily.   . metoprolol (LOPRESSOR) 50 MG tablet TAKE ONE-HALF (1/2) TABLET (25 MG) TWICE A DAY  . NEXIUM 40 MG capsule TAKE 1 CAPSULE DAILY  . nitroGLYCERIN (NITROSTAT) 0.4 MG SL tablet Place 1 tablet (0.4 mg total) under the tongue every 5 (five) minutes as needed for chest pain.  . Omega-3 Fatty Acids (FISH OIL) 1000 MG CAPS Take 1,000 mg by mouth daily.  . pravastatin (PRAVACHOL) 40 MG tablet TAKE 1 TABLET AT BEDTIME  . Tamsulosin HCl (FLOMAX) 0.4 MG CAPS Take 0.4 mg by mouth daily.  Alveda Reasons 20 MG TABS TAKE  1 TABLET (20 MG TOTAL) DAILY    Review of Systems  Constitutional: Negative.   HENT: Negative.   Eyes: Negative.   Respiratory: Negative.   Cardiovascular: Negative.   Gastrointestinal: Negative.   Endocrine: Negative.   Musculoskeletal: Positive for back pain.  Skin: Negative.   Allergic/Immunologic: Negative.   Neurological: Negative.   Hematological: Negative.   Psychiatric/Behavioral: Negative.   All other systems  reviewed and are negative.   BP 128/82  Pulse 56  Ht 6\' 4"  (1.93 m)  Wt 224 lb 4 oz (101.719 kg)  BMI 27.31 kg/m2  Physical Exam  Nursing note and vitals reviewed. Constitutional: He is oriented to person, place, and time. He appears well-developed and well-nourished.  HENT:  Head: Normocephalic.  Nose: Nose normal.  Mouth/Throat: Oropharynx is clear and moist.  Eyes: Conjunctivae are normal. Pupils are equal, round, and reactive to light.  Neck: Normal range of motion. Neck supple. No JVD present.  Cardiovascular: S1 normal, S2 normal, normal heart sounds and intact distal pulses.  An irregularly irregular rhythm present. Bradycardia present.  Exam reveals no gallop and no friction rub.   No murmur heard. Pulmonary/Chest: Effort normal and breath sounds normal. No respiratory distress. He has no wheezes. He has no rales. He exhibits no tenderness.  Abdominal: Soft. Bowel sounds are normal. He exhibits no distension. There is no tenderness.  Musculoskeletal: Normal range of motion. He exhibits no edema and no tenderness.  Lymphadenopathy:    He has no cervical adenopathy.  Neurological: He is alert and oriented to person, place, and time. Coordination normal.  Skin: Skin is warm and dry. No rash noted. No erythema.  Psychiatric: He has a normal mood and affect. His behavior is normal. Judgment and thought content normal.      Assessment and Plan

## 2013-11-03 NOTE — Assessment & Plan Note (Signed)
Recommended he take nitroglycerin as needed for chest pain. Isosorbide started with reasonable tolerance. So far mild headache

## 2013-11-03 NOTE — Assessment & Plan Note (Signed)
Cholesterol is at goal on the current lipid regimen. No changes to the medications were made.  

## 2013-11-03 NOTE — Patient Instructions (Signed)
You are doing well. No medication changes were made.  Please call us if you have new issues that need to be addressed before your next appt.  Your physician wants you to follow-up in: 6 months.  You will receive a reminder letter in the mail two months in advance. If you don't receive a letter, please call our office to schedule the follow-up appointment.   

## 2013-11-03 NOTE — Assessment & Plan Note (Signed)
Recent cardiac catheterization showing small vessel disease, otherwise patent stents medical management recommended. Vessels too small for intervention.William Taylor

## 2013-11-10 ENCOUNTER — Telehealth: Payer: Self-pay | Admitting: *Deleted

## 2013-11-10 NOTE — Telephone Encounter (Signed)
Pt wife called stating that pt has cloudy urine with an odor.  Since the pt recently had a heart attack, I advised him to be seen at the ED.  Pt refused, so he was scheduled an appt for tomorrow morning with R. Rey.

## 2013-11-11 ENCOUNTER — Other Ambulatory Visit: Payer: Self-pay | Admitting: Cardiovascular Disease

## 2013-11-11 ENCOUNTER — Ambulatory Visit: Payer: Medicare Other | Admitting: Adult Health

## 2013-11-11 DIAGNOSIS — R350 Frequency of micturition: Secondary | ICD-10-CM | POA: Diagnosis not present

## 2013-11-11 DIAGNOSIS — N3000 Acute cystitis without hematuria: Secondary | ICD-10-CM | POA: Diagnosis not present

## 2013-11-19 ENCOUNTER — Other Ambulatory Visit: Payer: Self-pay | Admitting: Internal Medicine

## 2013-11-22 ENCOUNTER — Encounter: Payer: Self-pay | Admitting: *Deleted

## 2013-11-23 DIAGNOSIS — M999 Biomechanical lesion, unspecified: Secondary | ICD-10-CM | POA: Diagnosis not present

## 2013-11-23 DIAGNOSIS — IMO0002 Reserved for concepts with insufficient information to code with codable children: Secondary | ICD-10-CM | POA: Diagnosis not present

## 2013-11-23 DIAGNOSIS — M5137 Other intervertebral disc degeneration, lumbosacral region: Secondary | ICD-10-CM | POA: Diagnosis not present

## 2013-12-01 ENCOUNTER — Encounter: Payer: Self-pay | Admitting: Internal Medicine

## 2013-12-01 ENCOUNTER — Ambulatory Visit (INDEPENDENT_AMBULATORY_CARE_PROVIDER_SITE_OTHER): Payer: Medicare Other | Admitting: Internal Medicine

## 2013-12-01 VITALS — BP 120/62 | HR 57 | Resp 16 | Wt 217.5 lb

## 2013-12-01 DIAGNOSIS — Z23 Encounter for immunization: Secondary | ICD-10-CM | POA: Diagnosis not present

## 2013-12-01 DIAGNOSIS — I2510 Atherosclerotic heart disease of native coronary artery without angina pectoris: Secondary | ICD-10-CM

## 2013-12-01 DIAGNOSIS — I4891 Unspecified atrial fibrillation: Secondary | ICD-10-CM

## 2013-12-01 NOTE — Progress Notes (Signed)
Pre visit review using our clinic review tool, if applicable. No additional management support is needed unless otherwise documented below in the visit note. 

## 2013-12-01 NOTE — Assessment & Plan Note (Signed)
NSR today. Will continue Diltiazem and Xarelto. Follow up 3 months and prn.

## 2013-12-01 NOTE — Assessment & Plan Note (Signed)
Symptomatically doing well. Continue current medications. Follow up 3 months and prn.

## 2013-12-01 NOTE — Progress Notes (Signed)
   Subjective:    Patient ID: William Taylor, male    DOB: 1930-02-25, 80 y.o.   MRN: [id number]  HPI 80YO male presents for follow up.  CAD - No chest pain. Occasional lightheadedness with standing. Headaches have resolved. BP at home running 117/70  Recently had some cloudy urine. Started on antibiotic by Dr. Jacqlyn Larsen. Has follow up scheduled Monday. Cloudiness cleared up. No hematuria. No dysuria. No fever, flank pain.  Review of Systems  Constitutional: Negative for fever, chills, activity change, appetite change, fatigue and unexpected weight change.  Eyes: Negative for visual disturbance.  Respiratory: Negative for cough and shortness of breath.   Cardiovascular: Negative for chest pain, palpitations and leg swelling.  Gastrointestinal: Negative for abdominal pain and abdominal distention.  Genitourinary: Negative for dysuria, urgency and difficulty urinating.  Musculoskeletal: Negative for arthralgias and gait problem.  Skin: Negative for color change and rash.  Neurological: Positive for light-headedness (rarely with initial standing).  Hematological: Negative for adenopathy.  Psychiatric/Behavioral: Negative for sleep disturbance and dysphoric mood. The patient is not nervous/anxious.        Objective:    BP 120/62  Pulse 57  Resp 16  Wt 217 lb 8 oz (98.657 kg)  SpO2 97% Physical Exam  Constitutional: He is oriented to person, place, and time. He appears well-developed and well-nourished. No distress.  HENT:  Head: Normocephalic and atraumatic.  Right Ear: External ear normal.  Left Ear: External ear normal.  Nose: Nose normal.  Mouth/Throat: Oropharynx is clear and moist. No oropharyngeal exudate.  Eyes: Conjunctivae and EOM are normal. Pupils are equal, round, and reactive to light. Right eye exhibits no discharge. Left eye exhibits no discharge. No scleral icterus.  Neck: Normal range of motion. Neck supple. No tracheal deviation present. No thyromegaly present.    Cardiovascular: Normal rate, regular rhythm and normal heart sounds.  Exam reveals no gallop and no friction rub.   No murmur heard. Pulmonary/Chest: Effort normal and breath sounds normal. No accessory muscle usage. Not tachypneic. No respiratory distress. He has no decreased breath sounds. He has no wheezes. He has no rhonchi. He has no rales. He exhibits no tenderness.  Musculoskeletal: Normal range of motion. He exhibits no edema.  Lymphadenopathy:    He has no cervical adenopathy.  Neurological: He is alert and oriented to person, place, and time. No cranial nerve deficit. Coordination normal.  Skin: Skin is warm and dry. No rash noted. He is not diaphoretic. No erythema. No pallor.  Psychiatric: He has a normal mood and affect. His behavior is normal. Judgment and thought content normal.          Assessment & Plan:   Problem List Items Addressed This Visit   ATRIAL FIBRILLATION     NSR today. Will continue Diltiazem and Xarelto. Follow up 3 months and prn.    CAD, NATIVE VESSEL     Symptomatically doing well. Continue current medications. Follow up 3 months and prn.     Other Visit Diagnoses   Need for vaccination with 13-polyvalent pneumococcal conjugate vaccine    -  Primary    Relevant Orders       Pneumococcal conjugate vaccine 13-valent (Completed)        Return in about 4 months (around 04/02/2014) for Wellness Visit.

## 2013-12-06 DIAGNOSIS — N390 Urinary tract infection, site not specified: Secondary | ICD-10-CM | POA: Diagnosis not present

## 2013-12-13 ENCOUNTER — Encounter: Payer: Self-pay | Admitting: Internal Medicine

## 2013-12-13 DIAGNOSIS — H4010X0 Unspecified open-angle glaucoma, stage unspecified: Secondary | ICD-10-CM | POA: Diagnosis not present

## 2013-12-21 DIAGNOSIS — M999 Biomechanical lesion, unspecified: Secondary | ICD-10-CM | POA: Diagnosis not present

## 2013-12-21 DIAGNOSIS — M5137 Other intervertebral disc degeneration, lumbosacral region: Secondary | ICD-10-CM | POA: Diagnosis not present

## 2013-12-21 DIAGNOSIS — IMO0002 Reserved for concepts with insufficient information to code with codable children: Secondary | ICD-10-CM | POA: Diagnosis not present

## 2014-01-18 DIAGNOSIS — R109 Unspecified abdominal pain: Secondary | ICD-10-CM | POA: Insufficient documentation

## 2014-01-18 DIAGNOSIS — IMO0002 Reserved for concepts with insufficient information to code with codable children: Secondary | ICD-10-CM | POA: Diagnosis not present

## 2014-01-18 DIAGNOSIS — M5137 Other intervertebral disc degeneration, lumbosacral region: Secondary | ICD-10-CM | POA: Diagnosis not present

## 2014-01-18 DIAGNOSIS — M999 Biomechanical lesion, unspecified: Secondary | ICD-10-CM | POA: Diagnosis not present

## 2014-01-19 DIAGNOSIS — R339 Retention of urine, unspecified: Secondary | ICD-10-CM | POA: Diagnosis not present

## 2014-01-19 DIAGNOSIS — C61 Malignant neoplasm of prostate: Secondary | ICD-10-CM | POA: Diagnosis not present

## 2014-01-19 DIAGNOSIS — R351 Nocturia: Secondary | ICD-10-CM | POA: Diagnosis not present

## 2014-01-19 DIAGNOSIS — R109 Unspecified abdominal pain: Secondary | ICD-10-CM | POA: Diagnosis not present

## 2014-02-21 ENCOUNTER — Inpatient Hospital Stay: Payer: Self-pay | Admitting: Internal Medicine

## 2014-02-21 DIAGNOSIS — R6510 Systemic inflammatory response syndrome (SIRS) of non-infectious origin without acute organ dysfunction: Secondary | ICD-10-CM | POA: Diagnosis not present

## 2014-02-21 DIAGNOSIS — Z86711 Personal history of pulmonary embolism: Secondary | ICD-10-CM | POA: Diagnosis not present

## 2014-02-21 DIAGNOSIS — I10 Essential (primary) hypertension: Secondary | ICD-10-CM | POA: Diagnosis present

## 2014-02-21 DIAGNOSIS — E876 Hypokalemia: Secondary | ICD-10-CM | POA: Diagnosis not present

## 2014-02-21 DIAGNOSIS — A498 Other bacterial infections of unspecified site: Secondary | ICD-10-CM | POA: Diagnosis present

## 2014-02-21 DIAGNOSIS — I2510 Atherosclerotic heart disease of native coronary artery without angina pectoris: Secondary | ICD-10-CM | POA: Diagnosis present

## 2014-02-21 DIAGNOSIS — I252 Old myocardial infarction: Secondary | ICD-10-CM | POA: Diagnosis not present

## 2014-02-21 DIAGNOSIS — Z7982 Long term (current) use of aspirin: Secondary | ICD-10-CM | POA: Diagnosis not present

## 2014-02-21 DIAGNOSIS — N390 Urinary tract infection, site not specified: Secondary | ICD-10-CM | POA: Diagnosis present

## 2014-02-21 DIAGNOSIS — R319 Hematuria, unspecified: Secondary | ICD-10-CM | POA: Diagnosis not present

## 2014-02-21 DIAGNOSIS — E785 Hyperlipidemia, unspecified: Secondary | ICD-10-CM | POA: Diagnosis present

## 2014-02-21 DIAGNOSIS — N170 Acute kidney failure with tubular necrosis: Secondary | ICD-10-CM | POA: Diagnosis present

## 2014-02-21 DIAGNOSIS — N179 Acute kidney failure, unspecified: Secondary | ICD-10-CM | POA: Diagnosis not present

## 2014-02-21 DIAGNOSIS — I959 Hypotension, unspecified: Secondary | ICD-10-CM | POA: Diagnosis not present

## 2014-02-21 DIAGNOSIS — A419 Sepsis, unspecified organism: Secondary | ICD-10-CM | POA: Diagnosis not present

## 2014-02-21 LAB — CBC WITH DIFFERENTIAL/PLATELET
Basophil #: 0 10*3/uL (ref 0.0–0.1)
Basophil %: 0.3 %
Eosinophil #: 0 10*3/uL (ref 0.0–0.7)
Eosinophil %: 0 %
HCT: 32.7 % — ABNORMAL LOW (ref 40.0–52.0)
HGB: 10.6 g/dL — AB (ref 13.0–18.0)
LYMPHS ABS: 0.1 10*3/uL — AB (ref 1.0–3.6)
LYMPHS PCT: 0.5 %
MCH: 31.4 pg (ref 26.0–34.0)
MCHC: 32.5 g/dL (ref 32.0–36.0)
MCV: 97 fL (ref 80–100)
MONO ABS: 0.1 x10 3/mm — AB (ref 0.2–1.0)
Monocyte %: 0.5 %
Neutrophil #: 15.8 10*3/uL — ABNORMAL HIGH (ref 1.4–6.5)
Neutrophil %: 98.7 %
Platelet: 181 10*3/uL (ref 150–440)
RBC: 3.38 10*6/uL — ABNORMAL LOW (ref 4.40–5.90)
RDW: 15 % — AB (ref 11.5–14.5)
WBC: 16 10*3/uL — ABNORMAL HIGH (ref 3.8–10.6)

## 2014-02-21 LAB — URINALYSIS, COMPLETE
Bilirubin,UR: NEGATIVE
GLUCOSE, UR: NEGATIVE mg/dL (ref 0–75)
KETONE: NEGATIVE
NITRITE: POSITIVE
Ph: 5 (ref 4.5–8.0)
Protein: NEGATIVE
Specific Gravity: 1.01 (ref 1.003–1.030)

## 2014-02-21 LAB — PHOSPHORUS: PHOSPHORUS: 0.8 mg/dL — AB (ref 2.5–4.9)

## 2014-02-21 LAB — PROTIME-INR
INR: 2.3
Prothrombin Time: 24.8 secs — ABNORMAL HIGH (ref 11.5–14.7)

## 2014-02-21 LAB — COMPREHENSIVE METABOLIC PANEL
ALK PHOS: 39 U/L — AB
Albumin: 3.3 g/dL — ABNORMAL LOW (ref 3.4–5.0)
Anion Gap: 9 (ref 7–16)
BUN: 25 mg/dL — ABNORMAL HIGH (ref 7–18)
Bilirubin,Total: 0.7 mg/dL (ref 0.2–1.0)
CO2: 23 mmol/L (ref 21–32)
Calcium, Total: 8.7 mg/dL (ref 8.5–10.1)
Chloride: 109 mmol/L — ABNORMAL HIGH (ref 98–107)
Creatinine: 1.67 mg/dL — ABNORMAL HIGH (ref 0.60–1.30)
EGFR (Non-African Amer.): 38 — ABNORMAL LOW
GFR CALC AF AMER: 44 — AB
GLUCOSE: 141 mg/dL — AB (ref 65–99)
Osmolality: 288 (ref 275–301)
POTASSIUM: 2.9 mmol/L — AB (ref 3.5–5.1)
SGOT(AST): 20 U/L (ref 15–37)
SGPT (ALT): 13 U/L (ref 12–78)
Sodium: 141 mmol/L (ref 136–145)
TOTAL PROTEIN: 6.8 g/dL (ref 6.4–8.2)

## 2014-02-21 LAB — TROPONIN I

## 2014-02-21 LAB — MAGNESIUM: MAGNESIUM: 1 mg/dL — AB

## 2014-02-22 DIAGNOSIS — N179 Acute kidney failure, unspecified: Secondary | ICD-10-CM | POA: Diagnosis not present

## 2014-02-22 DIAGNOSIS — A419 Sepsis, unspecified organism: Secondary | ICD-10-CM | POA: Diagnosis not present

## 2014-02-22 DIAGNOSIS — E876 Hypokalemia: Secondary | ICD-10-CM | POA: Diagnosis not present

## 2014-02-22 LAB — CBC WITH DIFFERENTIAL/PLATELET
BASOS ABS: 0 10*3/uL (ref 0.0–0.1)
Basophil %: 0.2 %
EOS ABS: 0 10*3/uL (ref 0.0–0.7)
Eosinophil %: 0 %
HCT: 29.3 % — AB (ref 40.0–52.0)
HGB: 9.3 g/dL — AB (ref 13.0–18.0)
LYMPHS ABS: 1 10*3/uL (ref 1.0–3.6)
LYMPHS PCT: 6.4 %
MCH: 29.6 pg (ref 26.0–34.0)
MCHC: 31.7 g/dL — ABNORMAL LOW (ref 32.0–36.0)
MCV: 93 fL (ref 80–100)
MONOS PCT: 5.5 %
Monocyte #: 0.9 x10 3/mm (ref 0.2–1.0)
Neutrophil #: 14.4 10*3/uL — ABNORMAL HIGH (ref 1.4–6.5)
Neutrophil %: 87.9 %
Platelet: 136 10*3/uL — ABNORMAL LOW (ref 150–440)
RBC: 3.14 10*6/uL — AB (ref 4.40–5.90)
RDW: 15.3 % — AB (ref 11.5–14.5)
WBC: 16.4 10*3/uL — AB (ref 3.8–10.6)

## 2014-02-22 LAB — BASIC METABOLIC PANEL
Anion Gap: 8 (ref 7–16)
BUN: 16 mg/dL (ref 7–18)
CHLORIDE: 112 mmol/L — AB (ref 98–107)
CO2: 23 mmol/L (ref 21–32)
Calcium, Total: 7.7 mg/dL — ABNORMAL LOW (ref 8.5–10.1)
Creatinine: 1.39 mg/dL — ABNORMAL HIGH (ref 0.60–1.30)
EGFR (Non-African Amer.): 47 — ABNORMAL LOW
GFR CALC AF AMER: 54 — AB
Glucose: 81 mg/dL (ref 65–99)
Osmolality: 285 (ref 275–301)
Potassium: 3.5 mmol/L (ref 3.5–5.1)
SODIUM: 143 mmol/L (ref 136–145)

## 2014-02-22 LAB — PHOSPHORUS: PHOSPHORUS: 1.9 mg/dL — AB (ref 2.5–4.9)

## 2014-02-22 LAB — MAGNESIUM: MAGNESIUM: 1.6 mg/dL — AB

## 2014-02-23 ENCOUNTER — Telehealth: Payer: Self-pay | Admitting: Internal Medicine

## 2014-02-23 DIAGNOSIS — A419 Sepsis, unspecified organism: Secondary | ICD-10-CM | POA: Diagnosis not present

## 2014-02-23 DIAGNOSIS — N179 Acute kidney failure, unspecified: Secondary | ICD-10-CM | POA: Diagnosis not present

## 2014-02-23 LAB — PHOSPHORUS: Phosphorus: 2.3 mg/dL — ABNORMAL LOW (ref 2.5–4.9)

## 2014-02-23 LAB — BASIC METABOLIC PANEL
Anion Gap: 9 (ref 7–16)
BUN: 13 mg/dL (ref 7–18)
CALCIUM: 8.3 mg/dL — AB (ref 8.5–10.1)
CHLORIDE: 108 mmol/L — AB (ref 98–107)
CREATININE: 1.13 mg/dL (ref 0.60–1.30)
Co2: 25 mmol/L (ref 21–32)
EGFR (African American): >60
Glucose: 98 mg/dL (ref 65–99)
Osmolality: 283 (ref 275–301)
POTASSIUM: 3.7 mmol/L (ref 3.5–5.1)
Sodium: 142 mmol/L (ref 136–145)

## 2014-02-23 LAB — URINE CULTURE

## 2014-02-23 LAB — MAGNESIUM: Magnesium: 1.7 mg/dL — ABNORMAL LOW

## 2014-02-23 LAB — WBC: WBC: 7.9 10*3/uL (ref 3.8–10.6)

## 2014-02-23 NOTE — Telephone Encounter (Signed)
1pm on Tuesday 7/14. 63min. Please also call and make sure he is doing okay after discharge.e

## 2014-02-23 NOTE — Telephone Encounter (Signed)
Please advise 

## 2014-02-23 NOTE — Telephone Encounter (Signed)
Pt scheduled 7/14 @ 1:00 p.m. With Dr. Gilford Rile.

## 2014-02-23 NOTE — Telephone Encounter (Signed)
Pt will be coming in on 7/14 at 1:00.  Pt's wife states that pt is doing well.

## 2014-02-23 NOTE — Telephone Encounter (Signed)
Patient being discharged from [facility] he was in there for sepsis he needs a 1-2 week follow up. Please advise where to put patient.

## 2014-02-26 LAB — CULTURE, BLOOD (SINGLE)

## 2014-02-27 ENCOUNTER — Emergency Department: Payer: Self-pay | Admitting: Emergency Medicine

## 2014-02-27 DIAGNOSIS — R0602 Shortness of breath: Secondary | ICD-10-CM | POA: Diagnosis not present

## 2014-02-27 DIAGNOSIS — E860 Dehydration: Secondary | ICD-10-CM | POA: Diagnosis not present

## 2014-02-27 LAB — URINALYSIS, COMPLETE
BILIRUBIN, UR: NEGATIVE
BLOOD: NEGATIVE
Bacteria: NONE SEEN
GLUCOSE, UR: NEGATIVE mg/dL (ref 0–75)
KETONE: NEGATIVE
Leukocyte Esterase: NEGATIVE
Nitrite: NEGATIVE
Ph: 5 (ref 4.5–8.0)
Protein: NEGATIVE
RBC,UR: 1 /HPF (ref 0–5)
SPECIFIC GRAVITY: 1.017 (ref 1.003–1.030)
Squamous Epithelial: <1
WBC UR: 3 /HPF (ref 0–5)

## 2014-02-27 LAB — COMPREHENSIVE METABOLIC PANEL
ALT: 18 U/L (ref 12–78)
Albumin: 3.3 g/dL — ABNORMAL LOW (ref 3.4–5.0)
Alkaline Phosphatase: 37 U/L — ABNORMAL LOW
Anion Gap: 6 — ABNORMAL LOW (ref 7–16)
BILIRUBIN TOTAL: 0.5 mg/dL (ref 0.2–1.0)
BUN: 26 mg/dL — ABNORMAL HIGH (ref 7–18)
CALCIUM: 8.5 mg/dL (ref 8.5–10.1)
CO2: 26 mmol/L (ref 21–32)
Chloride: 106 mmol/L (ref 98–107)
Creatinine: 1.48 mg/dL — ABNORMAL HIGH (ref 0.60–1.30)
EGFR (Non-African Amer.): 43 — ABNORMAL LOW
GFR CALC AF AMER: 50 — AB
Glucose: 109 mg/dL — ABNORMAL HIGH (ref 65–99)
Osmolality: 281 (ref 275–301)
POTASSIUM: 4.1 mmol/L (ref 3.5–5.1)
SGOT(AST): 23 U/L (ref 15–37)
SODIUM: 138 mmol/L (ref 136–145)
Total Protein: 7.1 g/dL (ref 6.4–8.2)

## 2014-02-27 LAB — TROPONIN I: Troponin-I: <0.02 ng/mL

## 2014-02-27 LAB — CBC
HCT: 33 % — ABNORMAL LOW (ref 40.0–52.0)
HGB: 10.6 g/dL — AB (ref 13.0–18.0)
MCH: 30.1 pg (ref 26.0–34.0)
MCHC: 32.2 g/dL (ref 32.0–36.0)
MCV: 94 fL (ref 80–100)
Platelet: 228 10*3/uL (ref 150–440)
RBC: 3.53 10*6/uL — AB (ref 4.40–5.90)
RDW: 15 % — ABNORMAL HIGH (ref 11.5–14.5)
WBC: 9.9 10*3/uL (ref 3.8–10.6)

## 2014-03-01 ENCOUNTER — Ambulatory Visit (INDEPENDENT_AMBULATORY_CARE_PROVIDER_SITE_OTHER): Payer: Medicare Other | Admitting: Internal Medicine

## 2014-03-01 ENCOUNTER — Encounter: Payer: Self-pay | Admitting: Internal Medicine

## 2014-03-01 VITALS — BP 106/62 | HR 60 | Temp 97.7°F | Resp 14 | Ht 76.0 in | Wt 222.2 lb

## 2014-03-01 DIAGNOSIS — N390 Urinary tract infection, site not specified: Secondary | ICD-10-CM | POA: Diagnosis not present

## 2014-03-01 DIAGNOSIS — D649 Anemia, unspecified: Secondary | ICD-10-CM | POA: Insufficient documentation

## 2014-03-01 DIAGNOSIS — N183 Chronic kidney disease, stage 3 unspecified: Secondary | ICD-10-CM

## 2014-03-01 LAB — POCT URINALYSIS DIPSTICK
Bilirubin, UA: NEGATIVE
Blood, UA: NEGATIVE
Glucose, UA: NEGATIVE
KETONES UA: NEGATIVE
LEUKOCYTES UA: NEGATIVE
NITRITE UA: NEGATIVE
PH UA: 5
Protein, UA: NEGATIVE
Spec Grav, UA: 1.025
Urobilinogen, UA: 0.2

## 2014-03-01 NOTE — Assessment & Plan Note (Signed)
Recent admission for UTI with hypotension. Symptoms have resolved after completing 1 week Cipro. Will repeat UA today. Followup in 3-4 weeks and prn.

## 2014-03-01 NOTE — Assessment & Plan Note (Signed)
Lab Results  Component Value Date   CREATININE 1.3 09/23/2013   Recent Cr increased in setting of dehydration. Will repeat renal function in 2 weeks. Encouraged increased fluid intake.

## 2014-03-01 NOTE — Assessment & Plan Note (Signed)
Found to have low Mag during admission. Will repeat Magnesium level with labs in 2 weeks.

## 2014-03-01 NOTE — Patient Instructions (Addendum)
Labs in 2-3 weeks.  Increase intake of fluids over the next few weeks.  Follow up in 4 weeks.

## 2014-03-01 NOTE — Progress Notes (Signed)
Subjective:    Patient ID: William Taylor, male    DOB: 1929-09-07, 80 y.o.   MRN: [id number]  HPI 80YO male presents for hospital follow up.  Admitted 7/5 through 7/8 at [facility].  Returned to ED because of weakness on 7/13.  7/5, started to feel poorly, with diaphoresis and chills. Temp 103F. Had some nausea and vomiting. Had gross blood in the urine. Admitted with UTI. Treated with Cipro and symptoms improved. Completed antibiotics on Sunday.  Felt lightheaded on Sunday 7/13. Was evaluated in the ED. Found to be anemic with low Mag. Treated with Mag supplement. Told he was dehydrated, instructed to increase fluid intake.  Feeling well today. No lightheadedness, fever, chills, dysuria, flank pain, hematuria. No change in bowel habits.  Review of Systems  Constitutional: Negative for fever, chills, activity change, appetite change, fatigue and unexpected weight change.  Eyes: Negative for visual disturbance.  Respiratory: Negative for cough and shortness of breath.   Cardiovascular: Negative for chest pain, palpitations and leg swelling.  Gastrointestinal: Negative for abdominal pain and abdominal distention.  Genitourinary: Negative for dysuria, urgency, frequency, hematuria, flank pain and difficulty urinating.  Musculoskeletal: Negative for arthralgias and gait problem.  Skin: Negative for color change and rash.  Hematological: Negative for adenopathy.  Psychiatric/Behavioral: Negative for sleep disturbance and dysphoric mood. The patient is not nervous/anxious.        Objective:    BP 106/62  Pulse 60  Temp(Src) 97.7 F (36.5 C) (Oral)  Resp 14  Ht 6\' 4"  (1.93 m)  Wt 222 lb 4 oz (100.812 kg)  BMI 27.06 kg/m2  SpO2 98% Physical Exam  Constitutional: He is oriented to person, place, and time. He appears well-developed and well-nourished. No distress.  HENT:  Head: Normocephalic and atraumatic.  Right Ear: External ear normal.  Left Ear: External ear normal.  Nose:  Nose normal.  Mouth/Throat: Oropharynx is clear and moist. No oropharyngeal exudate.  Eyes: Conjunctivae and EOM are normal. Pupils are equal, round, and reactive to light. Right eye exhibits no discharge. Left eye exhibits no discharge. No scleral icterus.  Neck: Normal range of motion. Neck supple. No tracheal deviation present. No thyromegaly present.  Cardiovascular: Normal rate, regular rhythm and normal heart sounds.  Exam reveals no gallop and no friction rub.   No murmur heard. Pulmonary/Chest: Effort normal and breath sounds normal. No accessory muscle usage. Not tachypneic. No respiratory distress. He has no decreased breath sounds. He has no wheezes. He has no rhonchi. He has no rales. He exhibits no tenderness.  Abdominal: Soft. Bowel sounds are normal. He exhibits no distension. There is no tenderness (no CVA tenderness).  Musculoskeletal: Normal range of motion. He exhibits no edema.  Lymphadenopathy:    He has no cervical adenopathy.  Neurological: He is alert and oriented to person, place, and time. No cranial nerve deficit. Coordination normal.  Skin: Skin is warm and dry. No rash noted. He is not diaphoretic. No erythema. No pallor.  Psychiatric: He has a normal mood and affect. His behavior is normal. Judgment and thought content normal.          Assessment & Plan:   Problem List Items Addressed This Visit     Unprioritized   Absolute anemia - Primary     Recent anemia noted on ED labs with Hgb 10. Last Hgb 13 in 09/2013. Question if hematuria may have led to anemia. No change in BM. Recent colonoscopy was normal. Will check stool IFOB. Repeat  CBC in 2-3 weeks.    Relevant Orders      CBC with Differential      Fecal occult blood, imunochemical   Chronic kidney disease (CKD), stage III (moderate)      Lab Results  Component Value Date   CREATININE 1.3 09/23/2013   Recent Cr increased in setting of dehydration. Will repeat renal function in 2 weeks. Encouraged  increased fluid intake.    Relevant Orders      Comprehensive metabolic panel   Hypomagnesemia     Found to have low Mag during admission. Will repeat Magnesium level with labs in 2 weeks.    Relevant Orders      Magnesium   Urinary tract infection, site not specified     Recent admission for UTI with hypotension. Symptoms have resolved after completing 1 week Cipro. Will repeat UA today. Followup in 3-4 weeks and prn.    Relevant Orders      POCT Urinalysis Dipstick (Completed)      CULTURE, URINE COMPREHENSIVE       Return in about 4 weeks (around 03/29/2014).

## 2014-03-01 NOTE — Progress Notes (Signed)
Pre visit review using our clinic review tool, if applicable. No additional management support is needed unless otherwise documented below in the visit note. 

## 2014-03-01 NOTE — Assessment & Plan Note (Signed)
Recent anemia noted on ED labs with Hgb 10. Last Hgb 13 in 09/2013. Question if hematuria may have led to anemia. No change in BM. Recent colonoscopy was normal. Will check stool IFOB. Repeat CBC in 2-3 weeks.

## 2014-03-03 LAB — CULTURE, URINE COMPREHENSIVE
COLONY COUNT: NO GROWTH
Organism ID, Bacteria: NO GROWTH

## 2014-03-07 ENCOUNTER — Ambulatory Visit: Payer: Medicare Other

## 2014-03-07 DIAGNOSIS — D649 Anemia, unspecified: Secondary | ICD-10-CM

## 2014-03-07 LAB — FECAL OCCULT BLOOD, IMMUNOCHEMICAL: Fecal Occult Bld: NEGATIVE

## 2014-03-22 ENCOUNTER — Other Ambulatory Visit (INDEPENDENT_AMBULATORY_CARE_PROVIDER_SITE_OTHER): Payer: Medicare Other

## 2014-03-22 DIAGNOSIS — D649 Anemia, unspecified: Secondary | ICD-10-CM

## 2014-03-22 DIAGNOSIS — N183 Chronic kidney disease, stage 3 unspecified: Secondary | ICD-10-CM | POA: Diagnosis not present

## 2014-03-23 ENCOUNTER — Telehealth: Payer: Self-pay | Admitting: *Deleted

## 2014-03-23 ENCOUNTER — Ambulatory Visit: Payer: Medicare Other

## 2014-03-23 DIAGNOSIS — E878 Other disorders of electrolyte and fluid balance, not elsewhere classified: Secondary | ICD-10-CM

## 2014-03-23 DIAGNOSIS — D510 Vitamin B12 deficiency anemia due to intrinsic factor deficiency: Secondary | ICD-10-CM

## 2014-03-23 DIAGNOSIS — D649 Anemia, unspecified: Secondary | ICD-10-CM

## 2014-03-23 LAB — CBC WITH DIFFERENTIAL/PLATELET
BASOS PCT: 0.6 % (ref 0.0–3.0)
Basophils Absolute: 0 10*3/uL (ref 0.0–0.1)
EOS PCT: 2.5 % (ref 0.0–5.0)
Eosinophils Absolute: 0.2 10*3/uL (ref 0.0–0.7)
HCT: 28.4 % — ABNORMAL LOW (ref 39.0–52.0)
Hemoglobin: 9.9 g/dL — ABNORMAL LOW (ref 13.0–17.0)
Lymphocytes Relative: 28.2 % (ref 12.0–46.0)
Lymphs Abs: 1.8 10*3/uL (ref 0.7–4.0)
MCHC: 34.7 g/dL (ref 30.0–36.0)
MCV: 98.7 fl (ref 78.0–100.0)
MONOS PCT: 7.2 % (ref 3.0–12.0)
Monocytes Absolute: 0.4 10*3/uL (ref 0.1–1.0)
NEUTROS PCT: 61.5 % (ref 43.0–77.0)
Neutro Abs: 3.8 10*3/uL (ref 1.4–7.7)
PLATELETS: 186 10*3/uL (ref 150.0–400.0)
RBC: 2.87 Mil/uL — AB (ref 4.22–5.81)
RDW: 16.9 % — ABNORMAL HIGH (ref 11.5–15.5)
WBC: 6.2 10*3/uL (ref 4.0–10.5)

## 2014-03-23 LAB — FERRITIN: FERRITIN: 9.2 ng/mL — AB (ref 22.0–322.0)

## 2014-03-23 LAB — COMPREHENSIVE METABOLIC PANEL
ALT: 10 U/L (ref 0–53)
AST: 20 U/L (ref 0–37)
Albumin: 3.7 g/dL (ref 3.5–5.2)
Alkaline Phosphatase: 32 U/L — ABNORMAL LOW (ref 39–117)
BUN: 24 mg/dL — AB (ref 6–23)
CO2: 25 meq/L (ref 19–32)
Calcium: 9.1 mg/dL (ref 8.4–10.5)
Chloride: 107 mEq/L (ref 96–112)
Creatinine, Ser: 1.2 mg/dL (ref 0.4–1.5)
GFR: 60.4 mL/min (ref >60.00)
Glucose, Bld: 117 mg/dL — ABNORMAL HIGH (ref 70–99)
Potassium: 4.2 mEq/L (ref 3.5–5.1)
SODIUM: 139 meq/L (ref 135–145)
TOTAL PROTEIN: 6.5 g/dL (ref 6.0–8.3)
Total Bilirubin: 0.6 mg/dL (ref 0.2–1.2)

## 2014-03-23 LAB — MAGNESIUM: Magnesium: 1.5 mg/dL (ref 1.5–2.5)

## 2014-03-23 NOTE — Telephone Encounter (Signed)
Need Dx code for B12 lab, we have tried anemia, CKD, Macrocytosis and pt does not have peripheral neuropathy.

## 2014-03-23 NOTE — Telephone Encounter (Signed)
Pernicious anemia

## 2014-03-24 DIAGNOSIS — I2510 Atherosclerotic heart disease of native coronary artery without angina pectoris: Secondary | ICD-10-CM | POA: Diagnosis not present

## 2014-03-24 DIAGNOSIS — N400 Enlarged prostate without lower urinary tract symptoms: Secondary | ICD-10-CM | POA: Diagnosis not present

## 2014-03-24 DIAGNOSIS — N183 Chronic kidney disease, stage 3 unspecified: Secondary | ICD-10-CM

## 2014-03-24 DIAGNOSIS — I10 Essential (primary) hypertension: Secondary | ICD-10-CM | POA: Diagnosis not present

## 2014-03-24 DIAGNOSIS — D649 Anemia, unspecified: Secondary | ICD-10-CM

## 2014-03-24 DIAGNOSIS — N390 Urinary tract infection, site not specified: Secondary | ICD-10-CM | POA: Diagnosis not present

## 2014-03-24 NOTE — Telephone Encounter (Signed)
Already tried this code

## 2014-03-24 NOTE — Telephone Encounter (Signed)
OK. I put order in and over-rode it.

## 2014-03-25 ENCOUNTER — Other Ambulatory Visit: Payer: Self-pay | Admitting: Internal Medicine

## 2014-03-25 ENCOUNTER — Other Ambulatory Visit (INDEPENDENT_AMBULATORY_CARE_PROVIDER_SITE_OTHER): Payer: Medicare Other

## 2014-03-25 DIAGNOSIS — D510 Vitamin B12 deficiency anemia due to intrinsic factor deficiency: Secondary | ICD-10-CM

## 2014-03-25 DIAGNOSIS — D649 Anemia, unspecified: Secondary | ICD-10-CM

## 2014-03-25 LAB — VITAMIN B12: Vitamin B-12: 333 pg/mL (ref 211–911)

## 2014-03-30 DIAGNOSIS — R5381 Other malaise: Secondary | ICD-10-CM | POA: Diagnosis not present

## 2014-03-30 DIAGNOSIS — K649 Unspecified hemorrhoids: Secondary | ICD-10-CM | POA: Diagnosis not present

## 2014-03-30 DIAGNOSIS — K5730 Diverticulosis of large intestine without perforation or abscess without bleeding: Secondary | ICD-10-CM | POA: Diagnosis not present

## 2014-03-30 DIAGNOSIS — R5383 Other fatigue: Secondary | ICD-10-CM | POA: Diagnosis not present

## 2014-03-30 DIAGNOSIS — D649 Anemia, unspecified: Secondary | ICD-10-CM | POA: Diagnosis not present

## 2014-03-31 ENCOUNTER — Encounter: Payer: Self-pay | Admitting: *Deleted

## 2014-04-06 ENCOUNTER — Encounter: Payer: Medicare Other | Admitting: Internal Medicine

## 2014-04-11 ENCOUNTER — Telehealth: Payer: Self-pay

## 2014-04-11 DIAGNOSIS — M999 Biomechanical lesion, unspecified: Secondary | ICD-10-CM | POA: Diagnosis not present

## 2014-04-11 DIAGNOSIS — M5137 Other intervertebral disc degeneration, lumbosacral region: Secondary | ICD-10-CM | POA: Diagnosis not present

## 2014-04-11 DIAGNOSIS — IMO0002 Reserved for concepts with insufficient information to code with codable children: Secondary | ICD-10-CM | POA: Diagnosis not present

## 2014-04-11 NOTE — Telephone Encounter (Signed)
Faxed cardiac clearance for per to proced w/ upper endoscopy for iron deficiency anemia to [facility] at [phone].  Dr. Rayann Heman does NOT want pt to hold xarelto for procedure due to recent cardiac event.  Dr. Rockey Situ is agreeable to this and pt has been cleared to proceed.

## 2014-04-12 DIAGNOSIS — D649 Anemia, unspecified: Secondary | ICD-10-CM | POA: Diagnosis not present

## 2014-04-12 DIAGNOSIS — R339 Retention of urine, unspecified: Secondary | ICD-10-CM | POA: Diagnosis not present

## 2014-04-12 DIAGNOSIS — R39198 Other difficulties with micturition: Secondary | ICD-10-CM | POA: Diagnosis not present

## 2014-04-12 DIAGNOSIS — C61 Malignant neoplasm of prostate: Secondary | ICD-10-CM | POA: Diagnosis not present

## 2014-04-15 ENCOUNTER — Encounter: Payer: Self-pay | Admitting: Internal Medicine

## 2014-04-15 ENCOUNTER — Ambulatory Visit (INDEPENDENT_AMBULATORY_CARE_PROVIDER_SITE_OTHER): Payer: Medicare Other | Admitting: Internal Medicine

## 2014-04-15 VITALS — BP 120/68 | HR 71 | Temp 97.9°F | Ht 76.0 in | Wt 222.0 lb

## 2014-04-15 DIAGNOSIS — R5381 Other malaise: Secondary | ICD-10-CM

## 2014-04-15 DIAGNOSIS — Z23 Encounter for immunization: Secondary | ICD-10-CM

## 2014-04-15 DIAGNOSIS — I10 Essential (primary) hypertension: Secondary | ICD-10-CM

## 2014-04-15 DIAGNOSIS — R5383 Other fatigue: Secondary | ICD-10-CM | POA: Insufficient documentation

## 2014-04-15 DIAGNOSIS — I2510 Atherosclerotic heart disease of native coronary artery without angina pectoris: Secondary | ICD-10-CM | POA: Diagnosis not present

## 2014-04-15 DIAGNOSIS — D649 Anemia, unspecified: Secondary | ICD-10-CM

## 2014-04-15 LAB — CBC WITH DIFFERENTIAL/PLATELET
BASOS PCT: 0.5 % (ref 0.0–3.0)
Basophils Absolute: 0 10*3/uL (ref 0.0–0.1)
Eosinophils Absolute: 0.1 10*3/uL (ref 0.0–0.7)
Eosinophils Relative: 1.4 % (ref 0.0–5.0)
HCT: 27.3 % — ABNORMAL LOW (ref 39.0–52.0)
Hemoglobin: 9 g/dL — ABNORMAL LOW (ref 13.0–17.0)
LYMPHS PCT: 25.4 % (ref 12.0–46.0)
Lymphs Abs: 1.6 10*3/uL (ref 0.7–4.0)
MCHC: 33.1 g/dL (ref 30.0–36.0)
MCV: 91.9 fl (ref 78.0–100.0)
Monocytes Absolute: 0.6 10*3/uL (ref 0.1–1.0)
Monocytes Relative: 9.3 % (ref 3.0–12.0)
NEUTROS PCT: 63.4 % (ref 43.0–77.0)
Neutro Abs: 3.9 10*3/uL (ref 1.4–7.7)
PLATELETS: 223 10*3/uL (ref 150.0–400.0)
RBC: 2.97 Mil/uL — AB (ref 4.22–5.81)
RDW: 15.5 % (ref 11.5–15.5)
WBC: 6.1 10*3/uL (ref 4.0–10.5)

## 2014-04-15 MED ORDER — METOPROLOL TARTRATE 50 MG PO TABS: ORAL_TABLET | ORAL | Status: DC

## 2014-04-15 MED ORDER — ESOMEPRAZOLE MAGNESIUM 40 MG PO CPDR: 40.0000 mg | DELAYED_RELEASE_CAPSULE | Freq: Every day | ORAL | Status: DC

## 2014-04-15 NOTE — Assessment & Plan Note (Signed)
Reviewed Hgb from 8/12 from [facility] which was 9.5. Upper endoscopy is pending to evaluate for source of GI blood loss. Will recheck Hgb today.

## 2014-04-15 NOTE — Assessment & Plan Note (Signed)
BP Readings from Last 3 Encounters:  04/15/14 120/68  03/01/14 106/62  12/01/13 120/62   BP well controlled on current medication will continue.

## 2014-04-15 NOTE — Progress Notes (Signed)
Pre visit review using our clinic review tool, if applicable. No additional management support is needed unless otherwise documented below in the visit note. 

## 2014-04-15 NOTE — Assessment & Plan Note (Signed)
Generalized fatigue likely related to ongoing anemia. EGD pending for evaluation of source of GI blood loss.

## 2014-04-15 NOTE — Addendum Note (Signed)
Addended by: Vernetta Honey on: 04/15/2014 02:26 PM   Modules accepted: Orders

## 2014-04-15 NOTE — Patient Instructions (Signed)
Labs today.  Follow up in 4 weeks. 

## 2014-04-15 NOTE — Progress Notes (Signed)
Subjective:    Patient ID: William Taylor, male    DOB: 1930-06-13, 80 y.o.   MRN: [id number]  HPI 80YO male presents for follow up.  Has not yet had endoscopy. Scheduled for 9/10. Continues to feel tired with occasional shortness of breath. No lightheadedness. No headaches. No change in BM, no black or bloody stool. Occasionally at night has mild crampy abdominal pain. No persistent or focal abdominal pain. Tolerating a regular diet.   Review of Systems  Constitutional: Negative for fever, chills, activity change, appetite change, fatigue and unexpected weight change.  Eyes: Negative for visual disturbance.  Respiratory: Negative for cough and shortness of breath.   Cardiovascular: Negative for chest pain, palpitations and leg swelling.  Gastrointestinal: Positive for abdominal pain. Negative for nausea, vomiting, diarrhea, constipation, blood in stool, abdominal distention, anal bleeding and rectal pain.  Genitourinary: Negative for dysuria, urgency and difficulty urinating.  Musculoskeletal: Negative for arthralgias and gait problem.  Skin: Negative for color change and rash.  Hematological: Negative for adenopathy.  Psychiatric/Behavioral: Negative for sleep disturbance and dysphoric mood. The patient is not nervous/anxious.        Objective:    BP 120/68  Pulse 71  Temp(Src) 97.9 F (36.6 C) (Oral)  Ht 6\' 4"  (1.93 m)  Wt 222 lb (100.699 kg)  BMI 27.03 kg/m2  SpO2 96% Physical Exam  Constitutional: He is oriented to person, place, and time. He appears well-developed and well-nourished. No distress.  HENT:  Head: Normocephalic and atraumatic.  Right Ear: External ear normal.  Left Ear: External ear normal.  Nose: Nose normal.  Mouth/Throat: Oropharynx is clear and moist. No oropharyngeal exudate.  Eyes: Conjunctivae and EOM are normal. Pupils are equal, round, and reactive to light. Right eye exhibits no discharge. Left eye exhibits no discharge. No scleral icterus.   Neck: Normal range of motion. Neck supple. No tracheal deviation present. No thyromegaly present.  Cardiovascular: Normal rate, regular rhythm and normal heart sounds.  Exam reveals no gallop and no friction rub.   No murmur heard. Pulmonary/Chest: Effort normal and breath sounds normal. No respiratory distress. He has no wheezes. He has no rales. He exhibits no tenderness.  Abdominal: Soft. Bowel sounds are normal. He exhibits no distension and no mass. There is no tenderness. There is no rebound and no guarding.  Musculoskeletal: Normal range of motion. He exhibits no edema.  Lymphadenopathy:    He has no cervical adenopathy.  Neurological: He is alert and oriented to person, place, and time. No cranial nerve deficit. Coordination normal.  Skin: Skin is warm and dry. No rash noted. He is not diaphoretic. No erythema. No pallor.  Psychiatric: He has a normal mood and affect. His behavior is normal. Judgment and thought content normal.          Assessment & Plan:   Problem List Items Addressed This Visit     Unprioritized   Absolute anemia - Primary     Reviewed Hgb from 8/12 from [facility] which was 9.5. Upper endoscopy is pending to evaluate for source of GI blood loss. Will recheck Hgb today.    Relevant Orders      CBC with Differential   Hypertension      BP Readings from Last 3 Encounters:  04/15/14 120/68  03/01/14 106/62  12/01/13 120/62   BP well controlled on current medication will continue.    Relevant Medications      metoprolol (LOPRESSOR) tablet   Other malaise and fatigue  Generalized fatigue likely related to ongoing anemia. EGD pending for evaluation of source of GI blood loss.        Return in about 4 weeks (around 05/13/2014) for Recheck.

## 2014-04-18 ENCOUNTER — Telehealth: Payer: Self-pay

## 2014-04-18 NOTE — Telephone Encounter (Signed)
Dr. Rayann Heman calling states she sent clearance for GI procedure to hold aspirin.

## 2014-04-18 NOTE — Telephone Encounter (Signed)
Received updated cardiac clearance request.  In Ignacia Bayley, NP's folder for his review.

## 2014-04-19 HISTORY — PX: UPPER GI ENDOSCOPY: SHX6162

## 2014-04-19 NOTE — Telephone Encounter (Signed)
Faxed 04/18/14.

## 2014-04-28 ENCOUNTER — Ambulatory Visit: Payer: Self-pay | Admitting: Gastroenterology

## 2014-04-28 DIAGNOSIS — E669 Obesity, unspecified: Secondary | ICD-10-CM | POA: Diagnosis not present

## 2014-04-28 DIAGNOSIS — Z8546 Personal history of malignant neoplasm of prostate: Secondary | ICD-10-CM | POA: Diagnosis not present

## 2014-04-28 DIAGNOSIS — I252 Old myocardial infarction: Secondary | ICD-10-CM | POA: Diagnosis not present

## 2014-04-28 DIAGNOSIS — D131 Benign neoplasm of stomach: Secondary | ICD-10-CM | POA: Diagnosis not present

## 2014-04-28 DIAGNOSIS — D509 Iron deficiency anemia, unspecified: Secondary | ICD-10-CM | POA: Diagnosis not present

## 2014-04-28 DIAGNOSIS — I10 Essential (primary) hypertension: Secondary | ICD-10-CM | POA: Diagnosis not present

## 2014-04-28 DIAGNOSIS — I4891 Unspecified atrial fibrillation: Secondary | ICD-10-CM | POA: Diagnosis not present

## 2014-04-28 DIAGNOSIS — Z86010 Personal history of colonic polyps: Secondary | ICD-10-CM | POA: Diagnosis not present

## 2014-04-28 DIAGNOSIS — I2510 Atherosclerotic heart disease of native coronary artery without angina pectoris: Secondary | ICD-10-CM | POA: Diagnosis not present

## 2014-04-28 DIAGNOSIS — Z79899 Other long term (current) drug therapy: Secondary | ICD-10-CM | POA: Diagnosis not present

## 2014-04-28 DIAGNOSIS — Z7902 Long term (current) use of antithrombotics/antiplatelets: Secondary | ICD-10-CM | POA: Diagnosis not present

## 2014-05-05 ENCOUNTER — Ambulatory Visit (INDEPENDENT_AMBULATORY_CARE_PROVIDER_SITE_OTHER): Payer: Medicare Other | Admitting: Cardiovascular Disease

## 2014-05-05 ENCOUNTER — Encounter: Payer: Self-pay | Admitting: Cardiovascular Disease

## 2014-05-05 VITALS — BP 110/80 | HR 55 | Ht 76.0 in | Wt 224.5 lb

## 2014-05-05 DIAGNOSIS — R5383 Other fatigue: Secondary | ICD-10-CM

## 2014-05-05 DIAGNOSIS — I4891 Unspecified atrial fibrillation: Secondary | ICD-10-CM

## 2014-05-05 DIAGNOSIS — I2510 Atherosclerotic heart disease of native coronary artery without angina pectoris: Secondary | ICD-10-CM

## 2014-05-05 DIAGNOSIS — I209 Angina pectoris, unspecified: Secondary | ICD-10-CM | POA: Diagnosis not present

## 2014-05-05 DIAGNOSIS — R5381 Other malaise: Secondary | ICD-10-CM

## 2014-05-05 DIAGNOSIS — I10 Essential (primary) hypertension: Secondary | ICD-10-CM | POA: Diagnosis not present

## 2014-05-05 DIAGNOSIS — E785 Hyperlipidemia, unspecified: Secondary | ICD-10-CM

## 2014-05-05 NOTE — Assessment & Plan Note (Addendum)
Recent shortness of breath and fatigued likely secondary to anemia. Most recent hematocrit 27. Currently on iron. We did discuss if there's no improvement in his blood count with iron, we could potentially hold the aspirin, continue xarelto

## 2014-05-05 NOTE — Progress Notes (Signed)
Patient ID: LYNKIN SAINI, male    DOB: [birth year], 80 y.o.   MRN: [id number]  HPI Comments: Mr. Patella is a very pleasant 80 year old gentleman with a history of coronary artery disease, stent in 2005, repeat catheterization in 2009 showing patent stent with no other significant disease, normal systolic function in September 2011 who presented to the emergency room September 2011 with malaise, atrial fibrillation with RVR in the setting of a urinary tract infection, recent admission to the hospital November 14 2010 for atrial fibrillation that developed after pushing mowing with some chest pain. He converted back to normal sinus rhythm, Presenting to [facility] with chest discomfort with cardiac catheterization Dec 25 2010, showing 90% OM 2 disease, transferred to [facility]  with DES stent placed, 2.25 x 16 mm POMUS stent, with flank pain developing after discharge, admitted to [facility] and diagnosed with pulmonary embolism with heavy clot burden.   Found to be in atrial fibrillation in 2013. He had symptoms of shortness of breath with exertion.  After one month on xarelto, amiodarone load and titration downward was started with conversion to NSR in October 2013  EKG the end of October 2013 confirmed normal sinus rhythm.   History of back surgery in 2008, history of prostate cancer followed by Dr. Jacqlyn Larsen   He was admitted to the hospital 10/27/2013 with chest pain. He had held his Plavix for colonoscopy 10/26/2013 with polypectomy x3. Cardiac catheterization performed given his severe chest pain and no disease showing severe small vessel disease, patent stents Amiodarone was held for bradycardia, he was started on isosorbide 30 mg daily, Plavix was held, he was continued on aspirin and xarelto.  In followup today, he reports having shortness of breath with fatigue. Recent endoscopy showing a polyp. He was started on iron for anemia, hematocrit 27. Denies any chest pain or lower extremity edema. Otherwise feels  well  Cardiac catheterization results; 90% disease at the ostium of a small diagonal vessel, small OM vessel with 80% disease in the proximal region Otherwise stents are patent  Echocardiogram 10/28/2013 shows normal LV function, mildly dilated left atrium, mild aortic regurg, normal right ventricular systolic pressures   EKG shows normal sinus rhythm with rate 56 beats per minute with no significant ST or T wave changes   Outpatient Encounter Prescriptions as of 05/05/2014  Medication Sig  . diltiazem (CARDIZEM CD) 240 MG 24 hr capsule TAKE 1 CAPSULE DAILY  . esomeprazole (NEXIUM) 40 MG capsule Take 1 capsule (40 mg total) by mouth daily at 12 noon.  . fenofibrate micronized (ANTARA) 130 MG capsule TAKE 1 CAPSULE DAILY BEFORE BREAKFAST  . ferrous fumarate (HEMOCYTE - 106 MG FE) 325 (106 FE) MG TABS tablet Take 1 tablet by mouth 3 (three) times daily.  . finasteride (PROSCAR) 5 MG tablet Take 5 mg by mouth daily.    . isosorbide mononitrate (IMDUR) 30 MG 24 hr tablet Take 1 tablet (30 mg total) by mouth daily.  Marland Kitchen levobunolol (BETAGAN) 0.5 % ophthalmic solution 1 drop 2 (two) times daily.   Marland Kitchen lisinopril (PRINIVIL,ZESTRIL) 5 MG tablet Take 1 tablet (5 mg total) by mouth daily.  . metoprolol (LOPRESSOR) 50 MG tablet TAKE ONE-HALF (1/2) TABLET (25 MG) TWICE A DAY  . nitroGLYCERIN (NITROSTAT) 0.4 MG SL tablet Place 1 tablet (0.4 mg total) under the tongue every 5 (five) minutes as needed for chest pain.  . Omega-3 Fatty Acids (FISH OIL) 1000 MG CAPS Take 1,000 mg by mouth daily.  . pravastatin (  PRAVACHOL) 40 MG tablet TAKE 1 TABLET AT BEDTIME  . Tamsulosin HCl (FLOMAX) 0.4 MG CAPS Take 0.4 mg by mouth daily.  Alveda Reasons 20 MG TABS tablet TAKE 1 TABLET DAILY  . [DISCONTINUED] aspirin 81 MG tablet Take 81 mg by mouth daily.    Review of Systems  Constitutional: Negative.   HENT: Negative.   Eyes: Negative.   Respiratory: Negative.   Cardiovascular: Negative.   Gastrointestinal: Negative.    Endocrine: Negative.   Musculoskeletal: Positive for back pain.  Skin: Negative.   Allergic/Immunologic: Negative.   Neurological: Negative.   Hematological: Negative.   Psychiatric/Behavioral: Negative.   All other systems reviewed and are negative.  BP 110/80  Pulse 55  Ht 6\' 4"  (1.93 m)  Wt 224 lb 8 oz (101.833 kg)  BMI 27.34 kg/m2  Physical Exam  Nursing note and vitals reviewed. Constitutional: He is oriented to person, place, and time. He appears well-developed and well-nourished.  Appears pale  HENT:  Head: Normocephalic.  Nose: Nose normal.  Mouth/Throat: Oropharynx is clear and moist.  Eyes: Conjunctivae are normal. Pupils are equal, round, and reactive to light.  Neck: Normal range of motion. Neck supple. No JVD present.  Cardiovascular: S1 normal, S2 normal, normal heart sounds and intact distal pulses.  An irregularly irregular rhythm present. Bradycardia present.  Exam reveals no gallop and no friction rub.   No murmur heard. Pulmonary/Chest: Effort normal and breath sounds normal. No respiratory distress. He has no wheezes. He has no rales. He exhibits no tenderness.  Abdominal: Soft. Bowel sounds are normal. He exhibits no distension. There is no tenderness.  Musculoskeletal: Normal range of motion. He exhibits no edema and no tenderness.  Lymphadenopathy:    He has no cervical adenopathy.  Neurological: He is alert and oriented to person, place, and time. Coordination normal.  Skin: Skin is warm and dry. No rash noted. No erythema.  Psychiatric: He has a normal mood and affect. His behavior is normal. Judgment and thought content normal.      Assessment and Plan

## 2014-05-05 NOTE — Assessment & Plan Note (Signed)
Currently with no symptoms of angina. No further medication changes made

## 2014-05-05 NOTE — Assessment & Plan Note (Signed)
Maintaining normal sinus rhythm. Asymptomatic bradycardia

## 2014-05-05 NOTE — Patient Instructions (Signed)
You are doing well. No medication changes were made.  Please call us if you have new issues that need to be addressed before your next appt.  Your physician wants you to follow-up in: 6 months.  You will receive a reminder letter in the mail two months in advance. If you don't receive a letter, please call our office to schedule the follow-up appointment.   

## 2014-05-05 NOTE — Assessment & Plan Note (Signed)
Blood pressure borderline low. He denies any orthostasis symptoms. Recommended he call us if he has any symptoms

## 2014-05-05 NOTE — Assessment & Plan Note (Signed)
Cholesterol is at goal on the current lipid regimen. No changes to the medications were made.  

## 2014-05-09 DIAGNOSIS — IMO0002 Reserved for concepts with insufficient information to code with codable children: Secondary | ICD-10-CM | POA: Diagnosis not present

## 2014-05-09 DIAGNOSIS — M5137 Other intervertebral disc degeneration, lumbosacral region: Secondary | ICD-10-CM | POA: Diagnosis not present

## 2014-05-09 DIAGNOSIS — M999 Biomechanical lesion, unspecified: Secondary | ICD-10-CM | POA: Diagnosis not present

## 2014-06-06 DIAGNOSIS — M9903 Segmental and somatic dysfunction of lumbar region: Secondary | ICD-10-CM | POA: Diagnosis not present

## 2014-06-06 DIAGNOSIS — M546 Pain in thoracic spine: Secondary | ICD-10-CM | POA: Diagnosis not present

## 2014-06-06 DIAGNOSIS — M9902 Segmental and somatic dysfunction of thoracic region: Secondary | ICD-10-CM | POA: Diagnosis not present

## 2014-06-06 DIAGNOSIS — M545 Low back pain: Secondary | ICD-10-CM | POA: Diagnosis not present

## 2014-06-13 DIAGNOSIS — H4011X2 Primary open-angle glaucoma, moderate stage: Secondary | ICD-10-CM | POA: Diagnosis not present

## 2014-06-22 DIAGNOSIS — D509 Iron deficiency anemia, unspecified: Secondary | ICD-10-CM | POA: Diagnosis not present

## 2014-07-04 DIAGNOSIS — S335XXA Sprain of ligaments of lumbar spine, initial encounter: Secondary | ICD-10-CM | POA: Diagnosis not present

## 2014-07-04 DIAGNOSIS — M9903 Segmental and somatic dysfunction of lumbar region: Secondary | ICD-10-CM | POA: Diagnosis not present

## 2014-07-27 ENCOUNTER — Other Ambulatory Visit: Payer: Self-pay | Admitting: Internal Medicine

## 2014-07-28 DIAGNOSIS — D225 Melanocytic nevi of trunk: Secondary | ICD-10-CM | POA: Diagnosis not present

## 2014-07-28 DIAGNOSIS — Z85828 Personal history of other malignant neoplasm of skin: Secondary | ICD-10-CM | POA: Diagnosis not present

## 2014-07-28 DIAGNOSIS — L570 Actinic keratosis: Secondary | ICD-10-CM | POA: Diagnosis not present

## 2014-07-28 DIAGNOSIS — D2239 Melanocytic nevi of other parts of face: Secondary | ICD-10-CM | POA: Diagnosis not present

## 2014-07-29 DIAGNOSIS — D509 Iron deficiency anemia, unspecified: Secondary | ICD-10-CM | POA: Diagnosis not present

## 2014-08-01 DIAGNOSIS — M531 Cervicobrachial syndrome: Secondary | ICD-10-CM | POA: Diagnosis not present

## 2014-08-01 DIAGNOSIS — M9903 Segmental and somatic dysfunction of lumbar region: Secondary | ICD-10-CM | POA: Diagnosis not present

## 2014-08-01 DIAGNOSIS — M545 Low back pain: Secondary | ICD-10-CM | POA: Diagnosis not present

## 2014-08-01 DIAGNOSIS — M9901 Segmental and somatic dysfunction of cervical region: Secondary | ICD-10-CM | POA: Diagnosis not present

## 2014-08-03 ENCOUNTER — Other Ambulatory Visit: Payer: Self-pay | Admitting: Cardiovascular Disease

## 2014-08-04 ENCOUNTER — Other Ambulatory Visit: Payer: Self-pay | Admitting: Cardiovascular Disease

## 2014-08-24 DIAGNOSIS — R3914 Feeling of incomplete bladder emptying: Secondary | ICD-10-CM | POA: Diagnosis not present

## 2014-08-24 DIAGNOSIS — C61 Malignant neoplasm of prostate: Secondary | ICD-10-CM | POA: Diagnosis not present

## 2014-08-24 DIAGNOSIS — I10 Essential (primary) hypertension: Secondary | ICD-10-CM | POA: Diagnosis not present

## 2014-08-24 DIAGNOSIS — N183 Chronic kidney disease, stage 3 (moderate): Secondary | ICD-10-CM | POA: Diagnosis not present

## 2014-08-24 DIAGNOSIS — N3941 Urge incontinence: Secondary | ICD-10-CM | POA: Diagnosis not present

## 2014-08-24 DIAGNOSIS — R339 Retention of urine, unspecified: Secondary | ICD-10-CM | POA: Diagnosis not present

## 2014-08-24 DIAGNOSIS — M545 Low back pain: Secondary | ICD-10-CM | POA: Diagnosis not present

## 2014-08-24 DIAGNOSIS — Z955 Presence of coronary angioplasty implant and graft: Secondary | ICD-10-CM | POA: Diagnosis not present

## 2014-08-24 DIAGNOSIS — R3919 Other difficulties with micturition: Secondary | ICD-10-CM | POA: Diagnosis not present

## 2014-08-24 DIAGNOSIS — I2510 Atherosclerotic heart disease of native coronary artery without angina pectoris: Secondary | ICD-10-CM | POA: Diagnosis not present

## 2014-08-24 DIAGNOSIS — D649 Anemia, unspecified: Secondary | ICD-10-CM | POA: Diagnosis not present

## 2014-08-24 DIAGNOSIS — I252 Old myocardial infarction: Secondary | ICD-10-CM | POA: Diagnosis not present

## 2014-08-24 DIAGNOSIS — Z79899 Other long term (current) drug therapy: Secondary | ICD-10-CM | POA: Diagnosis not present

## 2014-08-29 DIAGNOSIS — M546 Pain in thoracic spine: Secondary | ICD-10-CM | POA: Diagnosis not present

## 2014-08-29 DIAGNOSIS — M9902 Segmental and somatic dysfunction of thoracic region: Secondary | ICD-10-CM | POA: Diagnosis not present

## 2014-08-29 DIAGNOSIS — M9903 Segmental and somatic dysfunction of lumbar region: Secondary | ICD-10-CM | POA: Diagnosis not present

## 2014-08-29 DIAGNOSIS — M545 Low back pain: Secondary | ICD-10-CM | POA: Diagnosis not present

## 2014-09-26 DIAGNOSIS — M546 Pain in thoracic spine: Secondary | ICD-10-CM | POA: Diagnosis not present

## 2014-09-26 DIAGNOSIS — M9903 Segmental and somatic dysfunction of lumbar region: Secondary | ICD-10-CM | POA: Diagnosis not present

## 2014-09-26 DIAGNOSIS — M545 Low back pain: Secondary | ICD-10-CM | POA: Diagnosis not present

## 2014-09-26 DIAGNOSIS — M9902 Segmental and somatic dysfunction of thoracic region: Secondary | ICD-10-CM | POA: Diagnosis not present

## 2014-10-01 ENCOUNTER — Other Ambulatory Visit: Payer: Self-pay | Admitting: Internal Medicine

## 2014-10-09 ENCOUNTER — Other Ambulatory Visit: Payer: Self-pay | Admitting: Cardiovascular Disease

## 2014-10-12 DIAGNOSIS — D485 Neoplasm of uncertain behavior of skin: Secondary | ICD-10-CM | POA: Diagnosis not present

## 2014-10-12 DIAGNOSIS — D2212 Melanocytic nevi of left eyelid, including canthus: Secondary | ICD-10-CM | POA: Diagnosis not present

## 2014-10-21 DIAGNOSIS — M545 Low back pain: Secondary | ICD-10-CM | POA: Diagnosis not present

## 2014-10-21 DIAGNOSIS — M9903 Segmental and somatic dysfunction of lumbar region: Secondary | ICD-10-CM | POA: Diagnosis not present

## 2014-10-21 DIAGNOSIS — M9902 Segmental and somatic dysfunction of thoracic region: Secondary | ICD-10-CM | POA: Diagnosis not present

## 2014-10-21 DIAGNOSIS — M546 Pain in thoracic spine: Secondary | ICD-10-CM | POA: Diagnosis not present

## 2014-11-01 ENCOUNTER — Other Ambulatory Visit: Payer: Self-pay | Admitting: Internal Medicine

## 2014-11-01 ENCOUNTER — Encounter: Payer: Self-pay | Admitting: Cardiovascular Disease

## 2014-11-01 ENCOUNTER — Ambulatory Visit (INDEPENDENT_AMBULATORY_CARE_PROVIDER_SITE_OTHER): Payer: Medicare Other | Admitting: Cardiovascular Disease

## 2014-11-01 VITALS — BP 110/60 | HR 60 | Ht 76.0 in | Wt 227.5 lb

## 2014-11-01 DIAGNOSIS — E785 Hyperlipidemia, unspecified: Secondary | ICD-10-CM

## 2014-11-01 DIAGNOSIS — I2699 Other pulmonary embolism without acute cor pulmonale: Secondary | ICD-10-CM | POA: Diagnosis not present

## 2014-11-01 DIAGNOSIS — I4891 Unspecified atrial fibrillation: Secondary | ICD-10-CM | POA: Diagnosis not present

## 2014-11-01 DIAGNOSIS — R0602 Shortness of breath: Secondary | ICD-10-CM

## 2014-11-01 DIAGNOSIS — R5382 Chronic fatigue, unspecified: Secondary | ICD-10-CM

## 2014-11-01 DIAGNOSIS — R5383 Other fatigue: Secondary | ICD-10-CM | POA: Diagnosis not present

## 2014-11-01 DIAGNOSIS — I10 Essential (primary) hypertension: Secondary | ICD-10-CM | POA: Diagnosis not present

## 2014-11-01 DIAGNOSIS — I2510 Atherosclerotic heart disease of native coronary artery without angina pectoris: Secondary | ICD-10-CM | POA: Diagnosis not present

## 2014-11-01 NOTE — Assessment & Plan Note (Signed)
Blood pressure my check 141 systolic. Suggested he monitor his blood pressure at home and call our office if this runs low or for any orthostasis symptoms

## 2014-11-01 NOTE — Progress Notes (Signed)
Patient ID: William Taylor, male    DOB: Jun 02, 1930, 81 y.o.   MRN: [id number]  HPI Comments: William Taylor is a very pleasant 81 year old gentleman with a history of coronary artery disease, stent in 2005, repeat catheterization in 2009 showing patent stent with no other significant disease, normal systolic function in September 2011 who presented to the emergency room September 2011 with malaise, atrial fibrillation with RVR in the setting of a urinary tract infection, recent admission to the hospital November 14 2010 for atrial fibrillation that developed after pushing mowing with some chest pain. He converted back to normal sinus rhythm, Presenting to [facility] with chest discomfort with cardiac catheterization Dec 25 2010, showing 90% OM 2 disease, transferred to [facility]  with DES stent placed, 2.25 x 16 mm POMUS stent, with flank pain developing after discharge, admitted to [facility] and diagnosed with pulmonary embolism with heavy clot burden.   Found to be in atrial fibrillation in 2013. He had symptoms of shortness of breath with exertion.  After one month on xarelto, amiodarone load and titration downward was started with conversion to NSR in October 2013  EKG the end of October 2013 confirmed normal sinus rhythm.   History of back surgery in 2008, history of prostate cancer followed by Dr. Jacqlyn Larsen   He was admitted to the hospital 10/27/2013 with chest pain. He had held his Plavix for colonoscopy 10/26/2013 with polypectomy x3. Cardiac catheterization performed given his severe chest pain and no disease showing severe small vessel disease, patent stents Amiodarone was held for bradycardia, he was started on isosorbide 30 mg daily, Plavix was held, he was continued on aspirin and xarelto.  In followup today, he reports that he is doing well. Some fatigue. He is not exercising, sedentary, legs are getting weaker. Hematocrit has improved on lab work at the end of 2015. He is taking iron on a regular  basis. Reports having an EGD that did not show much as part of workup for anemia. Denies any significant shortness of breath or chest pain with exertion. Just gives out if he pushes too much  EKG on today's visit shows normal sinus rhythm with rate 60 bpm, no significant ST or T-wave changes  Other past medical history Cardiac catheterization results; 90% disease at the ostium of a small diagonal vessel, small OM vessel with 80% disease in the proximal region Otherwise stents are patent  Echocardiogram 10/28/2013 shows normal LV function, mildly dilated left atrium, mild aortic regurg, normal right ventricular systolic pressures  Allergies  Allergen Reactions  . Warfarin And Related     Joints ache.    Outpatient Encounter Prescriptions as of 11/01/2014  Medication Sig  . CARTIA XT 240 MG 24 hr capsule TAKE 1 CAPSULE DAILY  . esomeprazole (NEXIUM) 40 MG capsule Take 1 capsule (40 mg total) by mouth daily at 12 noon.  . fenofibrate micronized (ANTARA) 130 MG capsule TAKE 1 CAPSULE DAILY BEFORE BREAKFAST  . fenofibrate micronized (ANTARA) 130 MG capsule TAKE 1 CAPSULE DAILY BEFORE BREAKFAST  . ferrous fumarate (HEMOCYTE - 106 MG FE) 325 (106 FE) MG TABS tablet Take 1 tablet by mouth 3 (three) times daily.  . finasteride (PROSCAR) 5 MG tablet Take 5 mg by mouth daily.    . isosorbide mononitrate (IMDUR) 30 MG 24 hr tablet Take 1 tablet (30 mg total) by mouth daily.  Marland Kitchen levobunolol (BETAGAN) 0.5 % ophthalmic solution 1 drop 2 (two) times daily.   Marland Kitchen lisinopril (PRINIVIL,ZESTRIL) 5 MG tablet  TAKE 1 TABLET DAILY  . metoprolol (LOPRESSOR) 50 MG tablet TAKE ONE-HALF (1/2) TABLET (25 MG) TWICE A DAY  . NITROSTAT 0.4 MG SL tablet DISSOLVE 1 TABLET UNDER THE TONGUE EVERY 5 MINUTES AS NEEDED FOR CHEST PAIN  . Omega-3 Fatty Acids (FISH OIL) 1000 MG CAPS Take 1,000 mg by mouth daily.  . pravastatin (PRAVACHOL) 40 MG tablet TAKE 1 TABLET AT BEDTIME  . Tamsulosin HCl (FLOMAX) 0.4 MG CAPS Take 0.4 mg by  mouth daily.  Alveda Reasons 20 MG TABS tablet TAKE 1 TABLET DAILY  . [DISCONTINUED] diltiazem (CARDIZEM CD) 240 MG 24 hr capsule TAKE 1 CAPSULE DAILY    Past Medical History  Diagnosis Date  . Coronary artery disease   . Hypertension   . Hyperlipidemia   . Arrhythmia     A-Fib.  . Cancer     Prostate, followed by Dr. Jacqlyn Larsen  . MI (myocardial infarction)     Past Surgical History  Procedure Laterality Date  . Cataract extraction  Oct. 3, 2012    right eye  . Hernia repair  2013  . Lumbar spine surgery    . Colonoscopy    . Colonoscopy w/ polypectomy    . Coronary angioplasty  2009    2005; s/p stent  . Cardiac catheterization  10/2013    [facility]    Social History  reports that he has never smoked. He has never used smokeless tobacco. He reports that he does not drink alcohol or use illicit drugs.  Family History family history includes Colon cancer in his father; Stroke in his mother.     Review of Systems  Constitutional: Negative.   Respiratory: Negative.   Cardiovascular: Negative.   Gastrointestinal: Negative.   Musculoskeletal: Positive for back pain.  Skin: Negative.   Neurological: Negative.   Hematological: Negative.   Psychiatric/Behavioral: Negative.   All other systems reviewed and are negative.  BP 110/60 mmHg  Pulse 60  Ht 6\' 4"  (1.93 m)  Wt 227 lb 8 oz (103.193 kg)  BMI 27.70 kg/m2  Physical Exam  Constitutional: He is oriented to person, place, and time. He appears well-developed and well-nourished.  HENT:  Head: Normocephalic.  Nose: Nose normal.  Mouth/Throat: Oropharynx is clear and moist.  Eyes: Conjunctivae are normal. Pupils are equal, round, and reactive to light.  Neck: Normal range of motion. Neck supple. No JVD present.  Cardiovascular: S1 normal, S2 normal, normal heart sounds and intact distal pulses.  An irregularly irregular rhythm present. Bradycardia present.  Exam reveals no gallop and no friction rub.   No murmur  heard. Pulmonary/Chest: Effort normal and breath sounds normal. No respiratory distress. He has no wheezes. He has no rales. He exhibits no tenderness.  Abdominal: Soft. Bowel sounds are normal. He exhibits no distension. There is no tenderness.  Musculoskeletal: Normal range of motion. He exhibits no edema or tenderness.  Lymphadenopathy:    He has no cervical adenopathy.  Neurological: He is alert and oriented to person, place, and time. Coordination normal.  Skin: Skin is warm and dry. No rash noted. No erythema.  Psychiatric: He has a normal mood and affect. His behavior is normal. Judgment and thought content normal.      Assessment and Plan   Nursing note and vitals reviewed.

## 2014-11-01 NOTE — Assessment & Plan Note (Signed)
I suspect his chronic fatigue is secondary to deconditioning. Also reports he is not sleeping well certain nights. Recommended he start a regular exercise program to work on his conditioning. This will likely help his mood, breathing, overall conditioning, gait stability

## 2014-11-01 NOTE — Assessment & Plan Note (Signed)
Recommended that he stay on his anticoagulation especially in light of paroxysmal atrial fibrillation

## 2014-11-01 NOTE — Patient Instructions (Addendum)
You are doing well.  Consider holding the aspirin and continue xarelto  Please monitor your blood pressure at home Call the office if this runs low  Please start an exercise program, daily  Please call us if you have new issues that need to be addressed before your next appt.  Your physician wants you to follow-up in: 6 months.  You will receive a reminder letter in the mail two months in advance. If you don't receive a letter, please call our office to schedule the follow-up appointment.

## 2014-11-01 NOTE — Assessment & Plan Note (Signed)
Maintaining normal sinus rhythm. No medication changes made. Anemia significantly improved on iron supplementation

## 2014-11-01 NOTE — Assessment & Plan Note (Signed)
Currently with no symptoms of angina. No further workup at this time. Continue current medication regimen. 

## 2014-11-01 NOTE — Assessment & Plan Note (Signed)
Cholesterol is at goal on the current lipid regimen. No changes to the medications were made.  

## 2014-11-14 ENCOUNTER — Other Ambulatory Visit: Payer: Self-pay | Admitting: Cardiovascular Disease

## 2014-11-15 DIAGNOSIS — M9903 Segmental and somatic dysfunction of lumbar region: Secondary | ICD-10-CM | POA: Diagnosis not present

## 2014-11-15 DIAGNOSIS — M9902 Segmental and somatic dysfunction of thoracic region: Secondary | ICD-10-CM | POA: Diagnosis not present

## 2014-11-15 DIAGNOSIS — M545 Low back pain: Secondary | ICD-10-CM | POA: Diagnosis not present

## 2014-11-15 DIAGNOSIS — M546 Pain in thoracic spine: Secondary | ICD-10-CM | POA: Diagnosis not present

## 2014-12-10 NOTE — Consult Note (Signed)
General Aspect William Taylor is a 81yo Caucasian male w/ PMHx s/f h/o PE x 2, CAD (s/p two prior PCIs- most recently DES-OM2 in 2012), PAF, chronic Xarelto anticoagulation, h/o prostate CA, HTN and HLD who was admitted to [facility] yesterday for chest pain.  Coronary angiographic history- stent-mid LAD in 2005, nonobstructive CAD on 2009 cath. He underwent cath in 2012 secondary to USAP revealing 90% OM2 at a bifurcation, RCA was unable to be visualized. No interventional support at the time. Transported to [facility] where he underwent successful DES-OM2. Nonobstructive RCA dz. Also diagnosed with PE and heavy clot burden at that time at [facility]. Placed on Coumadin, continued on Plavix. Eventually transitioned to Xarelto.  Stable, maintaining SR and no anginal symptoms on 08/2013 follow-up.   Underwent screening colonoscopy 10/26/13, held Plavix and Xarelto x 3 days leading up to the procedure. He had 3 polypectomies. He tolerated the procedure well w/o incident. He returned home, took a nap, awoke, ate dinner and resumed Plavix (Xarelto to be restarted the following day). He watched the news and shortly after, developed sudden onset L arm pain, then substernal chest pressure w/ associated diaphoresis and dyspnea. The pain increased to a 10/10 intensity, and he called EMS.   Present Illness In the ED, EKG revealed no acute ischemic changes. Initial TnI WNL. D-dimer 0.51. CT-A- no PE, +CAD, 5 mm LUL subpleural nodule, 6.5 mm RUL subpleural posterior density (known, followed by PCP). CXR- no acute process. Abdominal/chest x-ray- no acute process. Hgb 12.4/Hct 36.7. MCV WNL. BMP largely WNL. He was admitted by the medicine service.   Troponins were cycled and found to be up-trending at 1.00->5.03. He was heparinized. Ate lunch today. No further chest discomfort.   PAST MEDICAL HISTORY:  1. Hypertension.  2. Hyperlipidemia.  3. Gastroesophageal reflux disease.  4. Coronary artery disease, status post stent  placement.  5. Paroxysmal atrial fibrillation.  6. Glaucoma.  7. Benign prostatic hypertrophy.    ALLERGIES: No known drug allergies.   SOCIAL HISTORY: No history of smoking. Occasional alcohol use. Denies any illicit drugs. Married, lives with his wife.   FAMILY HISTORY: Mother died from colon cancer. Father died from complications of heart disease.   Physical Exam:  GEN no acute distress, thin   HEENT pink conjunctivae, PERRL, hearing intact to voice   NECK supple  No masses  trachea midline  no JVD or bruits   RESP normal resp effort  clear BS  no use of accessory muscles   CARD Regular rate and rhythm  Normal, S1, S2  No murmur   ABD denies tenderness  soft  normal BS   EXTR negative cyanosis/clubbing, negative edema, +2 radial and femoral pulses, no femoral bruits   SKIN normal to palpation   NEURO follows commands, motor/sensory function intact   PSYCH alert, A+O to time, place, person   Review of Systems:  Subjective/Chief Complaint chest pain   Cardiovascular: Chest pain or discomfort  Dyspnea  diaphoresis   Review of Systems: All other systems were reviewed and found to be negative   Home Medications: Medication Instructions Status  cyproheptadine 4 mg oral tablet 1 tab(s) orally 3 times a day as needed   Active  pravastatin 40 mg oral tablet 1 tab(s) orally once a day (at bedtime)  Active  levobunolol 0.5% ophthalmic solution 1 drop in right eye 2 times a day  Active  nitroglycerin 0.4 mg sublingual tablet 1 tab(s) sublingual as needed for chest pain   Active  Antara 130 mg oral capsule 1 cap(s) orally once a day (in the morning) Active  diltiazem 240 mg/24 hours oral tablet, extended release 1 tab(s) orally once a day (in the morning) Active  finasteride 5 mg oral tablet 1 tab(s) orally once a day after lunch Active  Nexium 40 mg oral delayed release capsule 1 cap(s) orally once a day  in am Active  Plavix 75 mg oral tablet 1 tab(s) orally once a day in  am  Active  Fish Oil - oral capsule 360 milligram(s) orally 2 times a day Active  Metoprolol Tartrate 25 mg oral tablet 1 tab(s) orally 2 times a day Active  Xarelto 20 mg oral tablet  orally once a day Active   Lab Results:  Cardiac:  10-Mar-15 21:45   CPK-MB, Serum 0.6 (Result(s) reported on 27 Oct 2013 at 01:21AM.)  Troponin I < 0.02 (0.00-0.05 0.05 ng/mL or less: NEGATIVE  Repeat testing in 3-6 hrs  if clinically indicated. >0.05 ng/mL: POTENTIAL  MYOCARDIAL INJURY. Repeat  testing in 3-6 hrs if  clinically indicated. NOTE: An increase or decrease  of 30% or more on serial  testing suggests a  clinically important change)  11-Mar-15 02:15   CPK-MB, Serum -  CPK-MB, Serum  4.6 (Result(s) reported on 27 Oct 2013 at 03:15AM.)  Troponin I  1.00 (0.00-0.05 0.05 ng/mL or less: NEGATIVE  Repeat testing in 3-6 hrs  if clinically indicated. >0.05 ng/mL: POTENTIAL  MYOCARDIAL INJURY. Repeat  testing in 3-6 hrs if  clinically indicated. NOTE: An increase or decrease  of 30% or more on serial  testing suggests a  clinically important change)    06:09   CPK-MB, Serum  15.2 (Result(s) reported on 27 Oct 2013 at 07:13AM.)  Troponin I  5.10 (0.00-0.05 0.05 ng/mL or less: NEGATIVE  Repeat testing in 3-6 hrs  if clinically indicated. >0.05 ng/mL: POTENTIAL  MYOCARDIAL INJURY. Repeat  testing in 3-6 hrs if  clinically indicated. NOTE: An increase or decrease  of 30% or more on serial  testing suggests a  clinically important change)    12:17   CPK-MB, Serum  14.6 (Result(s) reported on 27 Oct 2013 at 12:57PM.)  Routine Coag:  10-Mar-15 21:45   D-Dimer, Quantitative  0.51 ("If the D-dimer test is being used to assist in the exclusion of DVT and/or PE, note the following:  In various studies concerning the D-dimer methodology (STA Liatest) in use by this laboratory, it has been reported that with a cut-off value of 0.50 ug/mL FEU, the  negative predictive value regarding the  exclusion of thrombosis is within the 95-100% range."  In patients with high pre-test probability of DVT/PE the results of the D-dimer test should be correlated with other diagnostic and clinical assessment modalities. Reference: [place]., 2005.)   EKG:  Interpretation NSR, LAD, TWI in V1, blunted TW in V2   Rate 61   EKG Comparision Not changed from  08/2013 office tracing    No Known Allergies:   Vital Signs/Nurse's Notes: **Vital Signs.:   11-Mar-15 11:31  Vital Signs Type Routine  Temperature Temperature (F) 98.3  Celsius 36.8  Temperature Source oral  Pulse Pulse 59  Respirations Respirations 18  Systolic BP Systolic BP 478  Diastolic BP (mmHg) Diastolic BP (mmHg) 83  Mean BP 101  Pulse Ox % Pulse Ox % 95  Pulse Ox Activity Level  At rest  Oxygen Delivery Room Air/ 21 %    Impression 81yo Caucasian male w/  PMHx s/f h/o PE x 2, CAD (s/p two prior PCIs- most recently DES-OM2 in 2012), PAF, chronic Xarelto anticoagulation, h/o prostate CA, HTN and HLD who was admitted to [facility] yesterday for chest pain.  1. NSTEMI Patient w/ prior history of CAD requiring PCI. Presenting with sudden onset of L arm pain followed by substernal chest pressure, diaphoresis and dyspnea. CT-A w/o evidence of PE. EKG on arrival w/o ischemic changes, however troponin has trended to 5.03. Chest pain free currently. NTG placed. This in the setting of being off Plavix prior to undergoing screening colonoscopy.  -- Plan for cardiac catheterization tomorrow- diagnostic to be performed by Dr. Rockey Situ. Dr. Fletcher Anon to perform PCI in the afternoon if needed.  -- Continue Plavix and heparin. Will discuss continuing ASA with MD. Patient will need to resume Xarelto post-cath. Consider BMS to reduce duration of triple therapy if PCI needed. -- Continue BB, statin, NTG SL PRN -- Add low-dose ACEi as pt hypertensive today -- Continue heparin, NPO at MN -- Check Hgb A1C, TSH  3. PAF Maintaining NSR.  CHA2DS2VASc = 4 (HTN, age > 68, CAD) -- Continue BB, CCB -- Resume Xarelto post-cath  4. H/o PE x 2 CT-A negative this admission.  -- Heparinized for NSTEMI, keep TED hose -- Xarelto to resume post-cath  5. Hyperlipidemia LDL 69.  -- Continue statin  6. Hypertension BP remains elevated today (SBP 160). Goal should be < 150.  -- Continue to monitor today. Consider adding low-dose ACEi if further control needed.   Electronic Signatures for Addendum Section:  Leonie Man (MD) (Signed Addendum 11-Mar-15 17:21)  I have seen & examined the patient this PM along with William Taylor.  I agree with his findings, exam & impressions-recommendations. 81 y/o man with h/o CAD & PCI to LAD & Cx presented with SSx of ACS - classic SSCP-->L arm pressure & dyspnea.  Ruled in for NSTEMI early this AM.  Had held Plavix & Xarelto x 3 days for colonoscopy yesterday.  Was intending to restart last PM. Has PAF - on Amiodarone & Xarelto -- is in NSR now. Currently pain free. ON NTG paste, BB & CCB.   NO HF on exam.  Recommend: continue BB& CCB antianginal / rate control. Hold Xarelto while on IV Heparin. Agree with ASA/Plavix for now.   We discussed invasivve vs. non-invasive care, he would prefer to proceed with invasive cardiac cath +/- PCI.  Has requested Dr. Rockey Situ to perform Dx  LHC/Angio. Continue IV Heparin o/n -- NTG prn  Provided he remains Angina free, he should be stable to plan LHC/Angio in AM.  PCI if indicated.  I discussed the procedure with R/B/A/I with Pt & wife.  ?s answered, they agree to proceed.   Electronic Signatures: Meriel Pica (PA-C)  (Signed 11-Mar-15 14:00)  Authored: General Aspect/Present Illness, History and Physical Exam, Review of System, Home Medications, Labs, EKG , Radiology, Allergies, Vital Signs/Nurse's Notes, Impression/Plan Leonie Man (MD)  (Signed 11-Mar-15 17:21)  Co-Signer: General Aspect/Present Illness, History and Physical Exam, Review  of System, Home Medications, Labs, EKG , Radiology, Allergies, Vital Signs/Nurse's Notes, Impression/Plan   Last Updated: 11-Mar-15 17:21 by Leonie Man (MD)

## 2014-12-10 NOTE — Discharge Summary (Signed)
PATIENT NAME:  William Taylor, William Taylor MR#:  [id number] DATE OF BIRTH:  08/22/1929  DATE OF ADMISSION:  10/27/2013 DATE OF DISCHARGE:  10/29/2013  ADMITTING DIAGNOSIS:  Chest pain, non-Q-wave myocardial infarction.   DISCHARGE DIAGNOSES:  1.  Non-Q-wave myocardial infarction.  2.  Status post cardiac catheterization by Dr. Fletcher Anon on 10/28/2013.  No stents are placed.  First diagonal as well as first obtuse marginal stenosis was noted of approximately 80% to 90%, likely culprit of myocardial infarction.  3.  Hypertension.  4.  Hyperlipidemia with LDL 69.  5.  Anemia.  6.  Gastroesophageal reflux disease. 7.  Coronary artery disease, stents in the past, last approximately three years ago.  Now recommended off Plavix.  8.  Paroxysmal atrial fibrillation, in sinus rhythm here. 9.  Glaucoma.  10.  Benign prostatic hypertrophy.   DISCHARGE CONDITION:  Stable.   DISCHARGE MEDICATIONS:  The patient is to continue:   1.  Cyproheptadine 4 mg 3 times daily as needed.  2.  Pravachol 40 mg by mouth at bedtime.  3.  Levobunolol 0.5% ophthalmic solution one drop to the right eye twice daily.  4.  Nitroglycerin 0.4 mg sublingually every five minutes as needed.  5.  Antara 130 mg by mouth daily.  6.  Diltiazem extended release 240 mg by mouth daily.  7.  Finasteride 5 mg by mouth daily.  8.  Nexium 40 mg by mouth daily.  9.  Fish oil 360 mg by mouth twice daily.  10.  Metoprolol tartrate 25 mg by mouth twice daily.  11.  Xarelto 20 mg by mouth daily.  12.  Lisinopril 5 mg by mouth daily.  13.  Isosorbide mononitrate 30 mg by mouth at bedtime.  14.  Aspirin 81 mg by mouth daily.  15.  The patient is not to take Plavix.   HOME OXYGEN:  None.   DIET:  2 gram salt, low fat, low cholesterol, regular consistency.   ACTIVITY LIMITATIONS:  As tolerated.    FOLLOWUP APPOINTMENT:  With Dr. Ronette Deter in two days after discharge, Dr. Rockey Situ, Dr. Fletcher Anon in one week after discharge.   CONSULTANTS:  Dr.  Fletcher Anon.   RADIOLOGIC STUDIES:  Three-way abdominal x-ray including PA of chest 10/26/2013 showing mildly prominent cardiomediastinal contour similar to prior, nonobstructive bowel gas pattern.  Angiography of chest to rule out pulmonary embolism 10/27/2013 revealing no evidence of pulmonary embolus, coronary artery calcifications were noted suggesting coronary artery disease, nodular densities identified in the left lung on prior exam were not well visualized currently, however 5 mm subpleural nodule was seen in left lower lobe as well as 6.5 mm subpleural density posteriorly in the right upper lobe.  If the patient is at high risk for bronchogenic carcinoma, follow-up chest CT in 6 to 12 months was recommended.  If the patient is at low risk for bronchogenic carcinoma, follow-up chest CT at 12 months is recommended according to radiology's recommendations.  Cardiac catheterization 10/28/2013 by Dr. Rockey Situ as well as Dr. Fletcher Anon, summary:  Patent LAD and left circumflex OM stent.  Otherwise, no significant occlusive disease.  Diffuse mild to moderate atherosclerosis.  Normal ejection fraction estimated at more than 55%.  No significant AS or MR.  Difficult time viewing RCA, numerous catheters used.  Discrete 90% stenosis of the ostium of the first diagonal was noted and a second lesion that was tubular 70% stenosis in the mid third of the vessel segment, the first obtuse marginal there was tubular 80% stenosis  in the proximal third of the vessel segment.  Second obtuse marginal there was 0% stenosis at the site of prior stent.    HOSPITAL COURSE:  The patient is an 81 year old Caucasian male with history of coronary artery disease who had stents placed some time ago who presents to the hospital with complaints of chest pain.  Please refer to Dr. Ward Givens admission note on 10/27/2013.  On arrival to the hospital, the patient's vital signs, temperature was 97.5, pulse was 60, respiration rate was 16, blood  pressure 153/87, saturation was 92% on room air.  Physical exam was unremarkable.  The patient's EKG revealed no acute ST-T changes and normal sinus rhythm.  The patient's lab data done on admission showed elevation of glucose to 117, otherwise BMP was unremarkable.  The patient's first set of cardiac enzymes was normal.  The second set revealed elevation of troponin to 1.0, the third set, troponin was 5.1.  Fourth set of troponin is 6.2, fifth 3.8.  The patient's white blood cell count was normal at 6.8, hemoglobin was 12.4, platelet count 171.  D-dimer was up to 0.31.  The patient was admitted to the hospital for further evaluation.  He underwent CT scanning of his chest to rule out pulmonary embolism which was negative for pulmonary embolism.  Cardiology consultation was obtained.  The patient was initiated on a heparin IV drip whenever his troponin was noted to be elevated.  Dr. Fletcher Anon as well as Dr. Rockey Situ felt the patient would benefit from cardiac catheterization.  Cardiac catheterization was performed on 10/28/2013 and no new stents were placed.  Only very small arteries were noted to be occluded and those were felt to be the culprit of the patient's MI.  The patient was continued on heparin IV until 10/29/2013, after which the heparin was discontinued.  The patient was advised to discontinue his Plavix as well since he had stents approximately three years ago.  He is, however, to continue aspirin therapy as well as Xarelto for his history of paroxysmal atrial fibrillation.  The patient is to follow up with his primary care physician as well as Dr. Fletcher Anon in the next few days after discharge.   In regards to hypertension, the patient's blood pressure was poorly controlled while he was in the hospital.  In fact, on arrival to the Emergency Room, the patient's blood pressure was around 150 and it remained around 150s to 160s while he was in the hospital.  His blood pressure medications were advanced.  Imdur  was added as well as lisinopril.  On the day of discharge, the patient's blood pressure is better controlled.    His vital signs on the day of discharge, temperature was 97.9, pulse was 65, respiration rate was 16, blood pressure 126/72, saturation was 91% to 94% on room air at rest.  It is recommended to follow the patient's blood pressure readings and advance the patient's lisinopril even higher if needed, following his creatinine.    In regards to hyperlipidemia, the patient's lipid panel was performed while he was in the hospital and LDL was found to be 69.  The patient's total cholesterol was 119, triglycerides were 61 and HDL was 38.  The patient is to continue his current management of his cholesterol and with low-fat diet as well as Antara and pravastatin.    For other chronic medical problems such as atrial fibrillation, the patient is to continue Xarelto.  He was in sinus rhythm while he was in the hospital.  For history of glaucoma, BPH he is to continue his outpatient management.  No changes were made.  He is being discharged in stable condition with above-mentioned medications and follow-up.   TIME SPENT:  40 minutes.    ____________________________ Theodoro Grist, MD rv:ea D: 10/29/2013 15:38:00 ET T: 10/30/2013 01:36:11 ET JOB#: [id number]  cc: Anderson Malta A. Gilford Rile, MD [name], MD Mertie Clause. Fletcher Anon, MD Theodoro Grist, MD, <Dictator>  Ashutosh Dieguez MD ELECTRONICALLY SIGNED 11/17/2013 23:02

## 2014-12-10 NOTE — H&P (Signed)
PATIENT NAME:  William Taylor, William Taylor MR#:  [id number] DATE OF BIRTH:  Mar 23, 1930  DATE OF ADMISSION:  02/21/2014  REFERRING PHYSICIAN: Dr. Lurline Hare.   PRIMARY CARE PHYSICIAN: Dr. Ronette Deter.   CHIEF COMPLAINT: Fever.   HISTORY OF PRESENT ILLNESS: This 81 year old Caucasian gentleman has a history of coronary artery disease with recent NSTEMI, PE currently on Xarelto therapy, hypertension, hyperlipidemia, presenting with fever. Describes fever of one-day duration with associated shaking chills, localizing symptoms. No dysuria, with change in urinary frequency, with associated light hematuria which he describes as pink, frothy urine. Denies any chest pain, cough, shortness of breath, abdominal pain, nausea, vomiting, diarrhea.   REVIEW OF SYSTEMS:  CONSTITUTIONAL: Positive for fevers, chills, as well as fatigue, weakness.  HEENT: Denies pain in eyes. Denies blurred vision, double vision, eye pain.  ENT:  Denies tinnitus or hearing loss. RESPIRATORY: Denies cough or shortness of breath. CARDIOVASCULAR: Denies chest, palpitations, edema.  GASTROINTESTINAL: Denies nausea, vomiting, diarrhea, abdominal pain.  GENITOURINARY: Positive for dysuria, hematuria.  ENDOCRINE: Denies nocturia or thyroid problems.  HEMATOLOGIC AND LYMPHATIC: Denies easy bruising. Positive for bleeding as described above.  GENITOURINARY: Denies dysuria.  SKIN: Denies rash or lesion.  MUSCULOSKELETAL: Denies pain in neck, back, shoulders, knees, hips or arthritic symptoms.  NEUROLOGIC: Denies paralysis, paresthesias.  PSYCHIATRIC: Denies anxiety or depressive symptoms.  Otherwise, full review of systems performed by me is negative.   PAST MEDICAL HISTORY: Coronary artery disease, PE, hypertension, hyperlipidemia, GERD, paroxysmal atrial fibrillation.  SOCIAL HISTORY: No tobacco use. Occasional alcohol use. No drug use.   FAMILY HISTORY: Positive for coronary artery disease.   ALLERGIES: No known drug allergies.    HOME MEDICATIONS: Include finasteride 5 mg p.o. daily, aspirin 81 mg p.o. daily, lisinopril 5 mg p.o. daily, Imdur 30 mg p.o. daily, nitroglycerin 0.4 mg sublingual as needed for chest pain, diltiazem 240 mg p.o. daily, Xarelto 20 mg p.o. daily, cyproheptadine 4 mg p.o. 3 times daily, Antara 130 mg p.o. daily, pravastatin 40 mg p.o. at bedtime, metoprolol 25 mg p.o. b.i.d., fish oil 360 mg p.o. b.i.d., levobunolol 0.5% ophthalmic solution one drop right eye b.i.d., Nexium 40 mg p.o. daily.   PHYSICAL EXAMINATION:  VITAL SIGNS: Temperature 98.3, heart rate 105, respirations 24, original blood pressure 81/51, currently 131/77, saturating 100% on supplemental O2. Weight 99.8 kg, BMI 26.8.  GENERAL: Well-nourished, well-developed Caucasian gentleman, currently in no acute distress.  HEAD: Normocephalic, atraumatic.  EYES: Pupils are equal, round, and reactive to light. Extraocular muscles are intact. No scleral icterus.  MOUTH: Moist mucous membranes. Dentition intact. No abscess noted.  EARS, NOSE, AND THROAT: Clear, without exudates. No external lesions.  NECK: Supple. No thyromegaly. No nodules. No JVD.  PULMONARY: Clear to auscultation bilaterally without wheeze, rales, or rhonchi. No use of accessory muscles. Good respiratory effort.  CHEST: Nontender to palpation.  CARDIOVASCULAR: S1, S2. Regular rate and rhythm. No murmurs, rubs, or gallops. No edema. Pedal pulses 2+ bilaterally.  GASTROINTESTINAL: Soft, nontender, nondistended. No masses. Positive bowel sounds. No CVA tenderness.  SKIN: No ulceration, lesions, rash, cyanosis. Skin warm, dry. Turgor intact.  NEUROLOGIC: Cranial nerves II through XII intact. No gross focal neurological deficits. Sensation is intact. Reflexes are intact. MUSCULOSKELETAL: No swelling, clubbing or edema. Range of motion is full in all extremities.  PSYCHIATRIC: Mood and affect within normal limits. The patient is awake, alert, oriented x3. Insight and judgment  intact.   LABORATORY DATA: Sodium 141, potassium 2.9, chloride 109, bicarbonate 23, BUN 25, creatinine 1.67,  glucose 141, phosphorus 0.8, magnesium 1. LFTs: Albumin 3.3, alkaline phosphatase 39. Troponin less than 0.02. WBC 16, hemoglobin 10.6, platelets of 181. ABG performed: 7.49, CO2 28, O2 94. Lactic acid of 3. Chest x-ray performed: No acute cardiopulmonary process. Urinalysis: Final results are pending, however, leukocyte esterase 2+, nitrites positive, blood 3+.   ASSESSMENT AND PLAN: An 81 year old gentleman with coronary artery disease, pulmonary embolism, hypertension, presenting with fever.  1.  Sepsis, meeting septic criteria for heart rate, respiratory rate, leukocytosis. Likely sepsis from urinary tract infection. Antibiotic coverage with ceftriaxone. Cultures, including blood and urine, sent. IV fluid hydration. Keep mean artery pressure greater than 65.  2.  Acute kidney injury. IV fluids. Follow urine output, renal function. Check a renal ultrasound.  3.  Hypokalemia. Replace potassium to goal of 4 to 5.  4.  Hypomagnesemia. Replace to goal of 2.  5.  Hypophosphatemia. Replace.  6.  Pulmonary embolism. Continue Xarelto. 7.  Deep venous thrombosis prophylaxis with Xarelto.  CODE STATUS: The patient is full code.   TIME SPENT: 45 minutes.   ____________________________ Aaron Mose. Levar Fayson, MD dkh:cg D: 02/21/2014 01:39:49 ET T: 02/21/2014 03:01:18 ET JOB#: [id number]  cc: Aaron Mose. Samanvitha Germany, MD, <Dictator> Ansar Skoda Woodfin Ganja MD ELECTRONICALLY SIGNED 02/22/2014 1:50

## 2014-12-10 NOTE — Discharge Summary (Signed)
Dates of Admission and Diagnosis:  Date of Admission 21-Feb-2014   Date of Discharge 23-Feb-2014   Admitting Diagnosis UTI   Final Diagnosis 1. E coli UTI 2. ARF 3. Hypophosphatemia 4. Hypomagnesemia    Chief Complaint/History of Present Illness PRIMARY CARE PHYSICIAN: Dr. Ronette Deter.   CHIEF COMPLAINT: Fever.   HISTORY OF PRESENT ILLNESS: This 81 year old Caucasian gentleman has a history of coronary artery disease with recent NSTEMI, PE currently on Xarelto therapy, hypertension, hyperlipidemia, presenting with fever. Describes fever of one-day duration with associated shaking chills, localizing symptoms. No dysuria, with change in urinary frequency, with associated light hematuria which he describes as pink, frothy urine. Denies any chest pain, cough, shortness of breath, abdominal pain, nausea, vomiting, diarrhea.   Allergies:  No Known Allergies:   Routine Chem:  07-Jul-15 05:07   Glucose, Serum 81  BUN 16  Creatinine (comp)  1.39  Sodium, Serum 143  Potassium, Serum 3.5  Chloride, Serum  112  CO2, Serum 23  Calcium (Total), Serum  7.7  Anion Gap 8  Osmolality (calc) 285  eGFR (African American)  54  eGFR (Non-African American)  47 (eGFR values <54mL/min/1.73 m2 may be an indication of chronic kidney disease (CKD). Calculated eGFR is useful in patients with stable renal function. The eGFR calculation will not be reliable in acutely ill patients when serum creatinine is changing rapidly. It is not useful in  patients on dialysis. The eGFR calculation may not be applicable to patients at the low and high extremes of body sizes, pregnant women, and vegetarians.)  Phosphorus, Serum  1.9 (Result(s) reported on 22 Feb 2014 at 05:42AM.)  Magnesium, Serum  1.6 (1.8-2.4 THERAPEUTIC RANGE: 4-7 mg/dL TOXIC: > 10 mg/dL  -----------------------)  08-Jul-15 05:11   Glucose, Serum 98  BUN 13  Creatinine (comp) 1.13  Sodium, Serum 142  Potassium, Serum 3.7  Chloride,  Serum  108  CO2, Serum 25  Calcium (Total), Serum  8.3  Anion Gap 9  Osmolality (calc) 283  eGFR (African American) >60  eGFR (Non-African American) >60 (eGFR values <70mL/min/1.73 m2 may be an indication of chronic kidney disease (CKD). Calculated eGFR is useful in patients with stable renal function. The eGFR calculation will not be reliable in acutely ill patients when serum creatinine is changing rapidly. It is not useful in  patients on dialysis. The eGFR calculation may not be applicable to patients at the low and high extremes of body sizes, pregnant women, and vegetarians.)  Phosphorus, Serum  2.3 (Result(s) reported on 23 Feb 2014 at 06:06AM.)  Magnesium, Serum  1.7 (1.8-2.4 THERAPEUTIC RANGE: 4-7 mg/dL TOXIC: > 10 mg/dL  -----------------------)  Routine Hem:  07-Jul-15 05:07   WBC (CBC)  16.4  RBC (CBC)  3.14  Hemoglobin (CBC)  9.3  Hematocrit (CBC)  29.3  Platelet Count (CBC)  136  MCV 93  MCH 29.6  MCHC  31.7  RDW  15.3  Neutrophil % 87.9  Lymphocyte % 6.4  Monocyte % 5.5  Eosinophil % 0.0  Basophil % 0.2  Neutrophil #  14.4  Lymphocyte # 1.0  Monocyte # 0.9  Eosinophil # 0.0  Basophil # 0.0 (Result(s) reported on 22 Feb 2014 at 05:50AM.)  08-Jul-15 05:11   WBC (CBC) 7.9 (Result(s) reported on 23 Feb 2014 at 06:14AM.)   Pertinent Past History:  Pertinent Past History CAD, PE, Afib, GERD, HTN   Hospital Course:  Hospital Course * Sepsis :present admission: due to Urinary Tract Infection; blood cultures negative so far  E coli sensitive to Ciprofloxacin  * Acute kidney injury. likely ATN from sepsis, continue IV fluids.  Improving renal func, follow-up in am  * Hypokalemia, hypophosphatemia adn hypomagnesmeia- being replaced aggressively, follow-up in am  * H/o Pulmonary embolism. Continue Xarelto.  *.Recent NSTEMI in march 2015- status post cardiac catheterization and small vessel disease, so no intervention done and medical management  advised  Time spent on discharge 40 minutes   Condition on Discharge Fair   Code Status:  Code Status Full Code   PHYSICAL EXAM ON DISCHARGE:  Physical Exam:  HEENT pale conjunctivae   NECK supple  No masses   RESP normal resp effort  clear BS   CARD regular rate   ABD denies tenderness  soft   DISCHARGE INSTRUCTIONS HOME MEDS:  Medication Reconciliation: Patient's Home Medications at Discharge:     Medication Instructions  cyproheptadine 4 mg oral tablet  1 tab(s) orally 3 times a day as needed     pravastatin 40 mg oral tablet  1 tab(s) orally once a day (at bedtime)    levobunolol 0.5% ophthalmic solution  1 drop in right eye 2 times a day    nitroglycerin 0.4 mg sublingual tablet  1 tab(s) sublingual as needed for chest pain     antara 130 mg oral capsule  1 cap(s) orally once a day (in the morning)   diltiazem 240 mg/24 hours oral tablet, extended release  1 tab(s) orally once a day (in the morning)   finasteride 5 mg oral tablet  1 tab(s) orally once a day after lunch   fish oil - oral capsule  360 milligram(s) orally 2 times a day   metoprolol tartrate 25 mg oral tablet  1 tab(s) orally 2 times a day   xarelto 20 mg oral tablet   orally once a day   lisinopril 5 mg oral tablet  1 tab(s) orally once a day   isosorbide mononitrate 30 mg oral tablet, extended release  1 tab(s) orally once a day (at bedtime)   nexium 40 mg oral delayed release capsule  1 cap(s) orally once a day   aspirin 81 mg oral tablet  1 tab(s) orally once a day   cipro 500 mg oral tablet  1 tab(s) orally 2 times a day   magnesium oxide 400 mg (241.3 mg elemental magnesium) oral tablet  1 tab(s) orally once a day     Physician's Instructions:  Diet Low Sodium   Activity Limitations As tolerated   Return to Work Not Applicable   Time frame for Follow Up Appointment 1-2 weeks  Dr. Ronette Deter   Electronic Signatures: Darvin Neighbours, Lottie Dawson (MD)  (Signed 09-Jul-15 13:39)  Authored:  ADMISSION DATE AND DIAGNOSIS, CHIEF COMPLAINT/HPI, Allergies, PERTINENT LABS, PERTINENT PAST HISTORY, HOSPITAL COURSE, PHYSICAL EXAM ON DISCHARGE, DISCHARGE INSTRUCTIONS HOME MEDS, PATIENT INSTRUCTIONS   Last Updated: 09-Jul-15 13:39 by Alba Destine (MD)

## 2014-12-10 NOTE — H&P (Signed)
PATIENT NAME:  William Taylor, William Taylor MR#:  [id number] DATE OF BIRTH:  12/22/1929  DATE OF ADMISSION:  10/27/2013  PRIMARY CARE PHYSICIAN: Dr. Ronette Deter.   REFERRING PHYSICIAN: Dr. Harvest Dark.   CHIEF COMPLAINT: Chest pain.   HISTORY OF PRESENT ILLNESS: Mr. Oldaker is an 81 year old male with history of coronary artery disease status post stent placement, 2 episodes of pulmonary embolism. Has been on chronic anticoagulation with Xarelto. Has been taken off of Plavix and Xarelto for the last 3 days as the patient underwent a colonoscopy today with removal of 3 polyps. The patient went home and was resting. Started to experience chest pain on the left side of the chest, radiating to the left arm. The patient felt a tightness in the chest. The pain was 10/10 intensity, associated with some shortness of breath. No exacerbating or relieving factors. Concerning this, the patient is brought to the Emergency Department. Workup in the Emergency Department with EKG and cardiac enzymes. Initial set was negative; however, the second set came back to be positive with a troponin of 1, CK-MB of 4.6. Concerning about the patient's previous history of PE, the patient also underwent a CT of the chest which was negative for PE. The patient currently denies having any chest pain.   PAST MEDICAL HISTORY:  1. Hypertension.  2. Hyperlipidemia.  3. Gastroesophageal reflux disease.  4. Coronary artery disease, status post stent placement.  5. Paroxysmal atrial fibrillation.  6. Glaucoma.  7. Benign prostatic hypertrophy.    ALLERGIES: No known drug allergies.   HOME MEDICATIONS:  1. Xarelto 20 mg once a day. Has been on hold for the last 3 days.  2. Pravastatin 40 mg once a day.  3. Plavix 75 mg once a day.  4. Nitroglycerin 0.4 mg as needed.  5. Nexium 40 mg once a day.  6. Metoprolol 25 mg 2 times a day.  7. Levobunolol 1 drop right eye 2 times a day.  8. Fish oil 360 mg orally 2 times a day.  9.  Finasteride 5 mg once a day.  10. Cardizem 240 mg once a day.  11. Cyproheptadine 4 mg  times a day.  12. Antara 130 mg 1 capsule once a day.   SOCIAL HISTORY: No history of smoking. Occasional alcohol use. Denies any illicit drugs. Married, lives with his wife.   FAMILY HISTORY: Mother died from colon cancer. Father died from complications of heart disease.   REVIEW OF SYSTEMS:  CONSTITUTIONAL: Experiencing some generalized weakness.  EYES: No change in vision.  ENT: No change in hearing. No sore throat.  RESPIRATORY: No cough, shortness of breath.  CARDIOVASCULAR: Has chest pain.  GASTROINTESTINAL: No nausea, vomiting, abdominal pain.  GENITOURINARY: No dysuria or hematuria.  HEMATOLOGIC: No easy bruising or bleeding.  ENDOCRINE: No polyuria or polydipsia.  SKIN: No rash or lesions.  MUSCULOSKELETAL: No joint pains and aches.  NEUROLOGIC: no numbness in any part of the body.   PHYSICAL EXAMINATION:  GENERAL: This is a well-built, well-nourished, age-appropriate male lying down in the bed, not in distress.  VITAL SIGNS: Temperature 97.5, pulse 60, blood pressure 153/87, respiratory rate of 16, oxygen saturation is 92% on room air.  HEENT: Head normocephalic, atraumatic. There is no scleral icterus. Conjunctivae normal. Pupils equal and react to light. Extraocular movements are intact. Mucous membranes moist. No pharyngeal erythema.  NECK: Supple. No lymphadenopathy. No JVD. No carotid bruit.  CHEST: Has no focal tenderness.  LUNGS: Bilaterally clear to auscultation.  HEART:  S1, S2 regular. No murmurs are heard. No pedal edema. Pulses 2+.  ABDOMEN: Bowel sounds present. Soft, nontender, nondistended. No hepatosplenomegaly.  EXTREMITIES: No pedal edema. Pulses 2+.  SKIN: No rash or lesions.  MUSCULOSKELETAL: Good range of motion in all of the extremities.  NEUROLOGIC: The patient is alert, oriented to place, person and time. Cranial nerves II through XII intact. Motor 5/5 in upper  and lower extremities.   LABORATORIES: CT of the chest negative for any PE.   Troponin 1.   D-dimer 0.51.   BMP is completely within normal limits.   CBC: WBC of 6.8, hemoglobin 12.4, platelets of 171.   Troponin .   EKG, 12 lead: Normal sinus rhythm. No  abnormalities.   ASSESSMENT AND PLAN: Mr. Falconi is an 81 year old male with known history of coronary artery disease status post stent placement, comes with chest pain and is found to have non-ST elevation myocardial infarction.  1. Non-ST elevation myocardial infarction: Start the patient on heparin drip. Consult cardiology. Obtain echocardiogram in the morning. The patient had polyps removed today. Start the patient on aspirin; however, watch the hemoglobin closely.  2. Hypertension: Currently well controlled.  3. Atrial fibrillation: Rate is well controlled. The patient was on Xarelto which has been held.  4. History of deep vein thrombosis and pulmonary embolism: As mentioned above, the patient has been off of Xarelto for 3 days; however, CTA is negative for pulmonary embolism.  5. The patient will be on heparin drip.   TIME SPENT: 45 minutes.    ____________________________ Monica Becton, MD pv:gb D: 10/27/2013 04:32:53 ET T: 10/27/2013 05:10:27 ET JOB#: [id number]  cc: Monica Becton, MD, <Dictator> Eduard Clos. Gilford Rile, MD Grier Mitts Caitland Porchia MD ELECTRONICALLY SIGNED 11/04/2013 4:35

## 2014-12-11 NOTE — Op Note (Signed)
PATIENT NAME:  William Taylor, William Taylor MR#:  [id number] DATE OF BIRTH:  1930-02-10  DATE OF PROCEDURE:  11/05/2011  PREOPERATIVE DIAGNOSIS: Left inguinal hernia.   POSTOPERATIVE DIAGNOSIS: Left inguinal hernia.   PROCEDURE: Left inguinal hernia repair.   SURGEON: Loreli Dollar, MD  ANESTHESIA: General.   INDICATIONS: This 81 year old male has had bulging with associated pain in the left groin. A hernia was demonstrated on physical exam and repair was recommended for definitive treatment.   DESCRIPTION OF PROCEDURE: The patient was placed on the operating table in the supine position under general anesthesia. The abdomen was prepared with ChloraPrep, draped in a sterile manner.   A transversely oriented left suprapubic incision was made and carried down through subcutaneous tissues. Several small veins were cauterized and divided. Numerous small bleeding points were cauterized. The Scarpa's fascia was incised. The external oblique aponeurosis was incised along the course of its fibers to open the external ring and expose the inguinal cord structures. The cord structures were mobilized. The ilioinguinal nerve was seen coursing medially. The floor of the inguinal canal had some mild weakness. Cremaster fibers were spread to expose an indirect hernia sac which was dissected free from surrounding structures. The sac was some four inches in length and was dissected up into the internal ring. It was opened. Its continuity with the peritoneal cavity was demonstrated. A high ligation of the sac was done with a 0 Surgilon suture ligature. The sac was excised and the stump was allowed to retract. A small cord lipoma was also divided with electrocautery and removed. I did not need to send the specimens for pathology. Next, the floor of the inguinal canal was reinforced with a row of 0 Surgilon sutures beginning at the pubic tubercle suturing the conjoined tendon to the shelving edge of the inguinal ligament  incorporating transversalis fascia into the repair. The last stitch led to satisfactory narrowing of the internal ring. The repair looked good. Hemostasis was intact. The cord structures were replaced along the floor of the inguinal canal. Cut edges of the external oblique aponeurosis were closed with a running 4-0 Vicryl. The fascia superior and lateral to the repair site was infiltrated with Sensorcaine with epinephrine. The subcuticular tissues were also infiltrated. The Scarpa's fascia was closed with interrupted 4-0 Vicryl. The skin was closed with running 5-0 Monocryl subcuticular suture and Dermabond. The patient tolerated surgery satisfactorily and was then prepared for transfer to the recovery room.  ____________________________ Lenna Sciara. Rochel Brome, MD jws:cms D: 11/05/2011 10:38:06 ET T: 11/05/2011 11:59:19 ET JOB#: [id number]  cc: Loreli Dollar, MD, <Dictator> Loreli Dollar MD ELECTRONICALLY SIGNED 11/07/2011 20:22

## 2014-12-12 DIAGNOSIS — H4011X2 Primary open-angle glaucoma, moderate stage: Secondary | ICD-10-CM | POA: Diagnosis not present

## 2014-12-13 DIAGNOSIS — M9903 Segmental and somatic dysfunction of lumbar region: Secondary | ICD-10-CM | POA: Diagnosis not present

## 2014-12-13 DIAGNOSIS — M5134 Other intervertebral disc degeneration, thoracic region: Secondary | ICD-10-CM | POA: Diagnosis not present

## 2014-12-13 DIAGNOSIS — M9902 Segmental and somatic dysfunction of thoracic region: Secondary | ICD-10-CM | POA: Diagnosis not present

## 2014-12-13 DIAGNOSIS — M5136 Other intervertebral disc degeneration, lumbar region: Secondary | ICD-10-CM | POA: Diagnosis not present

## 2014-12-15 DIAGNOSIS — H4011X2 Primary open-angle glaucoma, moderate stage: Secondary | ICD-10-CM | POA: Diagnosis not present

## 2014-12-30 ENCOUNTER — Other Ambulatory Visit: Payer: Self-pay | Admitting: Internal Medicine

## 2014-12-30 NOTE — Telephone Encounter (Signed)
Last OV 8/15 ok to fill?

## 2015-01-10 DIAGNOSIS — M9903 Segmental and somatic dysfunction of lumbar region: Secondary | ICD-10-CM | POA: Diagnosis not present

## 2015-01-10 DIAGNOSIS — M5136 Other intervertebral disc degeneration, lumbar region: Secondary | ICD-10-CM | POA: Diagnosis not present

## 2015-01-10 DIAGNOSIS — M5134 Other intervertebral disc degeneration, thoracic region: Secondary | ICD-10-CM | POA: Diagnosis not present

## 2015-01-10 DIAGNOSIS — M9902 Segmental and somatic dysfunction of thoracic region: Secondary | ICD-10-CM | POA: Diagnosis not present

## 2015-01-20 ENCOUNTER — Other Ambulatory Visit: Payer: Self-pay | Admitting: Internal Medicine

## 2015-02-07 DIAGNOSIS — M9903 Segmental and somatic dysfunction of lumbar region: Secondary | ICD-10-CM | POA: Diagnosis not present

## 2015-02-07 DIAGNOSIS — M9902 Segmental and somatic dysfunction of thoracic region: Secondary | ICD-10-CM | POA: Diagnosis not present

## 2015-02-07 DIAGNOSIS — M5134 Other intervertebral disc degeneration, thoracic region: Secondary | ICD-10-CM | POA: Diagnosis not present

## 2015-02-07 DIAGNOSIS — M5136 Other intervertebral disc degeneration, lumbar region: Secondary | ICD-10-CM | POA: Diagnosis not present

## 2015-03-06 DIAGNOSIS — M5136 Other intervertebral disc degeneration, lumbar region: Secondary | ICD-10-CM | POA: Diagnosis not present

## 2015-03-06 DIAGNOSIS — M5134 Other intervertebral disc degeneration, thoracic region: Secondary | ICD-10-CM | POA: Diagnosis not present

## 2015-03-06 DIAGNOSIS — M9902 Segmental and somatic dysfunction of thoracic region: Secondary | ICD-10-CM | POA: Diagnosis not present

## 2015-03-06 DIAGNOSIS — M9903 Segmental and somatic dysfunction of lumbar region: Secondary | ICD-10-CM | POA: Diagnosis not present

## 2015-03-14 DIAGNOSIS — R3914 Feeling of incomplete bladder emptying: Secondary | ICD-10-CM | POA: Diagnosis not present

## 2015-03-14 DIAGNOSIS — Z955 Presence of coronary angioplasty implant and graft: Secondary | ICD-10-CM | POA: Diagnosis not present

## 2015-03-14 DIAGNOSIS — I2510 Atherosclerotic heart disease of native coronary artery without angina pectoris: Secondary | ICD-10-CM | POA: Diagnosis not present

## 2015-03-14 DIAGNOSIS — I4891 Unspecified atrial fibrillation: Secondary | ICD-10-CM | POA: Diagnosis not present

## 2015-03-14 DIAGNOSIS — C61 Malignant neoplasm of prostate: Secondary | ICD-10-CM | POA: Diagnosis not present

## 2015-03-14 DIAGNOSIS — Z7901 Long term (current) use of anticoagulants: Secondary | ICD-10-CM | POA: Diagnosis not present

## 2015-03-14 DIAGNOSIS — M545 Low back pain: Secondary | ICD-10-CM | POA: Diagnosis not present

## 2015-03-14 DIAGNOSIS — Z888 Allergy status to other drugs, medicaments and biological substances status: Secondary | ICD-10-CM | POA: Diagnosis not present

## 2015-03-14 DIAGNOSIS — Z6826 Body mass index (BMI) 26.0-26.9, adult: Secondary | ICD-10-CM | POA: Diagnosis not present

## 2015-03-14 DIAGNOSIS — I252 Old myocardial infarction: Secondary | ICD-10-CM | POA: Diagnosis not present

## 2015-03-14 DIAGNOSIS — N320 Bladder-neck obstruction: Secondary | ICD-10-CM | POA: Diagnosis not present

## 2015-03-14 DIAGNOSIS — I10 Essential (primary) hypertension: Secondary | ICD-10-CM | POA: Diagnosis not present

## 2015-03-14 DIAGNOSIS — R339 Retention of urine, unspecified: Secondary | ICD-10-CM | POA: Diagnosis not present

## 2015-03-16 ENCOUNTER — Other Ambulatory Visit: Payer: Self-pay | Admitting: Internal Medicine

## 2015-03-16 NOTE — Telephone Encounter (Signed)
Last OV 8.28.15.  Please advise refill

## 2015-04-03 DIAGNOSIS — M9902 Segmental and somatic dysfunction of thoracic region: Secondary | ICD-10-CM | POA: Diagnosis not present

## 2015-04-03 DIAGNOSIS — M9903 Segmental and somatic dysfunction of lumbar region: Secondary | ICD-10-CM | POA: Diagnosis not present

## 2015-04-03 DIAGNOSIS — M5136 Other intervertebral disc degeneration, lumbar region: Secondary | ICD-10-CM | POA: Diagnosis not present

## 2015-04-03 DIAGNOSIS — M5134 Other intervertebral disc degeneration, thoracic region: Secondary | ICD-10-CM | POA: Diagnosis not present

## 2015-04-05 DIAGNOSIS — N183 Chronic kidney disease, stage 3 (moderate): Secondary | ICD-10-CM | POA: Diagnosis not present

## 2015-04-05 DIAGNOSIS — I10 Essential (primary) hypertension: Secondary | ICD-10-CM | POA: Diagnosis not present

## 2015-04-15 ENCOUNTER — Telehealth: Payer: Self-pay | Admitting: Family

## 2015-04-15 DIAGNOSIS — J209 Acute bronchitis, unspecified: Secondary | ICD-10-CM

## 2015-04-15 MED ORDER — BENZONATATE 100 MG PO CAPS: 100.0000 mg | ORAL_CAPSULE | Freq: Three times a day (TID) | ORAL | Status: DC | PRN

## 2015-04-15 MED ORDER — AZITHROMYCIN 250 MG PO TABS: ORAL_TABLET | ORAL | Status: DC

## 2015-04-15 NOTE — Progress Notes (Signed)
We are sorry that you are not feeling well.  Here is how we plan to help!  Based on what you have shared with me it looks like you have upper respiratory tract inflammation that has resulted in a significant cough.  Inflammation and infection in the upper respiratory tract is commonly called bronchitis and has four common causes:  Allergies, Viral Infections, Acid Reflux and Bacterial Infections.  Allergies, viruses and acid reflux are treated by controlling symptoms or eliminating the cause. An example might be a cough caused by taking certain blood pressure medications. You stop the cough by changing the medication. Another example might be a cough caused by acid reflux. Controlling the reflux helps control the cough.  Based on your presentation I believe you most likely have A cough due to bacteria.  When patients have a fever and a productive cough with a change in color or increased sputum production, we are concerned about bacterial bronchitis.  If left untreated it can progress to pneumonia.  If your symptoms do not improve with your treatment plan it is important that you contact your provider.   I have prescribed Azithromyin 250 mg: two tables now and then one tablet daily for 4 additonal days   In addition you may use A non-prescription cough medication called Mucinex DM: take 2 tablets every 12 hours. and A prescription cough medication called Tessalon Perles 100mg. You may take 1-2 capsules every 8 hours as needed for your cough.    HOME CARE . Only take medications as instructed by your medical team. . Complete the entire course of an antibiotic. . Drink plenty of fluids and get plenty of rest. . Avoid close contacts especially the very young and the elderly . Cover your mouth if you cough or cough into your sleeve. . Always remember to wash your hands . A steam or ultrasonic humidifier can help congestion.    GET HELP RIGHT AWAY IF: . You develop worsening fever. . You become short  of breath . You cough up blood. . Your symptoms persist after you have completed your treatment plan MAKE SURE YOU   Understand these instructions.  Will watch your condition.  Will get help right away if you are not doing well or get worse.  Your e-visit answers were reviewed by a board certified advanced clinical practitioner to complete your personal care plan.  Depending on the condition, your plan could have included both over the counter or prescription medications. If there is a problem please reply  once you have received a response from your provider. Your safety is important to us.  If you have drug allergies check your prescription carefully.    You can use MyChart to ask questions about today's visit, request a non-urgent call back, or ask for a work or school excuse for 24 hours related to this e-Visit. If it has been greater than 24 hours you will need to follow up with your provider, or enter a new e-Visit to address those concerns. You will get an e-mail in the next two days asking about your experience.  I hope that your e-visit has been valuable and will speed your recovery. Thank you for using e-visits.   

## 2015-04-21 ENCOUNTER — Other Ambulatory Visit: Payer: Self-pay | Admitting: Internal Medicine

## 2015-04-21 NOTE — Telephone Encounter (Signed)
Last OV was in 04/15/2014.  Please advise?

## 2015-05-01 DIAGNOSIS — M9902 Segmental and somatic dysfunction of thoracic region: Secondary | ICD-10-CM | POA: Diagnosis not present

## 2015-05-01 DIAGNOSIS — M9903 Segmental and somatic dysfunction of lumbar region: Secondary | ICD-10-CM | POA: Diagnosis not present

## 2015-05-01 DIAGNOSIS — M5136 Other intervertebral disc degeneration, lumbar region: Secondary | ICD-10-CM | POA: Diagnosis not present

## 2015-05-01 DIAGNOSIS — M5134 Other intervertebral disc degeneration, thoracic region: Secondary | ICD-10-CM | POA: Diagnosis not present

## 2015-05-05 ENCOUNTER — Ambulatory Visit (INDEPENDENT_AMBULATORY_CARE_PROVIDER_SITE_OTHER): Payer: Medicare Other

## 2015-05-05 DIAGNOSIS — Z23 Encounter for immunization: Secondary | ICD-10-CM | POA: Diagnosis not present

## 2015-05-05 MED ORDER — TETANUS-DIPHTH-ACELL PERTUSSIS 5-2.5-18.5 LF-MCG/0.5 IM SUSP
0.5000 mL | Freq: Once | INTRAMUSCULAR | Status: AC
  Administered 2015-05-05: 0.5 mL via INTRAMUSCULAR

## 2015-05-05 NOTE — Progress Notes (Signed)
Patient came in for a flu and tdap shot.  Received and tolerated well.  Patient has not been seen in a year advise to make an appointment to follow up with Dr. Gilford Rile

## 2015-05-09 ENCOUNTER — Ambulatory Visit: Payer: Medicare Other

## 2015-05-18 ENCOUNTER — Encounter: Payer: Self-pay | Admitting: *Deleted

## 2015-05-18 ENCOUNTER — Encounter: Payer: Self-pay | Admitting: Internal Medicine

## 2015-05-18 ENCOUNTER — Ambulatory Visit (INDEPENDENT_AMBULATORY_CARE_PROVIDER_SITE_OTHER): Payer: Medicare Other | Admitting: Internal Medicine

## 2015-05-18 VITALS — BP 130/84 | HR 75 | Temp 97.6°F | Ht 76.0 in | Wt 226.2 lb

## 2015-05-18 DIAGNOSIS — D649 Anemia, unspecified: Secondary | ICD-10-CM

## 2015-05-18 DIAGNOSIS — E785 Hyperlipidemia, unspecified: Secondary | ICD-10-CM | POA: Diagnosis not present

## 2015-05-18 DIAGNOSIS — G4700 Insomnia, unspecified: Secondary | ICD-10-CM

## 2015-05-18 DIAGNOSIS — I10 Essential (primary) hypertension: Secondary | ICD-10-CM

## 2015-05-18 DIAGNOSIS — N183 Chronic kidney disease, stage 3 unspecified: Secondary | ICD-10-CM

## 2015-05-18 DIAGNOSIS — I2510 Atherosclerotic heart disease of native coronary artery without angina pectoris: Secondary | ICD-10-CM

## 2015-05-18 DIAGNOSIS — I4891 Unspecified atrial fibrillation: Secondary | ICD-10-CM

## 2015-05-18 DIAGNOSIS — K648 Other hemorrhoids: Secondary | ICD-10-CM

## 2015-05-18 DIAGNOSIS — K644 Residual hemorrhoidal skin tags: Secondary | ICD-10-CM

## 2015-05-18 LAB — CBC WITH DIFFERENTIAL/PLATELET
Basophils Absolute: 0 10*3/uL (ref 0.0–0.1)
Basophils Relative: 0.3 % (ref 0.0–3.0)
EOS PCT: 1.4 % (ref 0.0–5.0)
Eosinophils Absolute: 0.1 10*3/uL (ref 0.0–0.7)
HCT: 38.9 % — ABNORMAL LOW (ref 39.0–52.0)
HEMOGLOBIN: 12.9 g/dL — AB (ref 13.0–17.0)
Lymphocytes Relative: 24.6 % (ref 12.0–46.0)
Lymphs Abs: 1.8 10*3/uL (ref 0.7–4.0)
MCHC: 33 g/dL (ref 30.0–36.0)
MCV: 97.2 fl (ref 78.0–100.0)
MONO ABS: 0.6 10*3/uL (ref 0.1–1.0)
MONOS PCT: 8.1 % (ref 3.0–12.0)
Neutro Abs: 4.8 10*3/uL (ref 1.4–7.7)
Neutrophils Relative %: 65.6 % (ref 43.0–77.0)
Platelets: 201 10*3/uL (ref 150.0–400.0)
RBC: 4.01 Mil/uL — AB (ref 4.22–5.81)
RDW: 14.2 % (ref 11.5–15.5)
WBC: 7.3 10*3/uL (ref 4.0–10.5)

## 2015-05-18 LAB — COMPREHENSIVE METABOLIC PANEL
ALBUMIN: 4 g/dL (ref 3.5–5.2)
ALK PHOS: 33 U/L — AB (ref 39–117)
ALT: 14 U/L (ref 0–53)
AST: 17 U/L (ref 0–37)
BILIRUBIN TOTAL: 0.6 mg/dL (ref 0.2–1.2)
BUN: 18 mg/dL (ref 6–23)
CO2: 28 mEq/L (ref 19–32)
CREATININE: 1.08 mg/dL (ref 0.40–1.50)
Calcium: 9.3 mg/dL (ref 8.4–10.5)
Chloride: 106 mEq/L (ref 96–112)
GFR: 69.33 mL/min (ref >60.00)
Glucose, Bld: 119 mg/dL — ABNORMAL HIGH (ref 70–99)
POTASSIUM: 3.8 meq/L (ref 3.5–5.1)
SODIUM: 142 meq/L (ref 135–145)
TOTAL PROTEIN: 7 g/dL (ref 6.0–8.3)

## 2015-05-18 LAB — LIPID PANEL
CHOLESTEROL: 113 mg/dL (ref 0–200)
HDL: 33.8 mg/dL — ABNORMAL LOW (ref >39.00)
LDL Cholesterol: 67 mg/dL (ref 0–99)
NonHDL: 79.06
Total CHOL/HDL Ratio: 3
Triglycerides: 58 mg/dL (ref 0.0–149.0)
VLDL: 11.6 mg/dL (ref 0.0–40.0)

## 2015-05-18 MED ORDER — PRAVASTATIN SODIUM 40 MG PO TABS: 40.0000 mg | ORAL_TABLET | Freq: Every day | ORAL | Status: DC

## 2015-05-18 MED ORDER — ZALEPLON 5 MG PO CAPS: 5.0000 mg | ORAL_CAPSULE | Freq: Every evening | ORAL | Status: DC | PRN

## 2015-05-18 MED ORDER — CYPROHEPTADINE HCL 4 MG PO TABS: 4.0000 mg | ORAL_TABLET | Freq: Three times a day (TID) | ORAL | Status: DC | PRN

## 2015-05-18 MED ORDER — DILTIAZEM HCL ER COATED BEADS 240 MG PO CP24: 240.0000 mg | ORAL_CAPSULE | Freq: Every day | ORAL | Status: DC

## 2015-05-18 NOTE — Assessment & Plan Note (Signed)
Chronic insomnia. No improvement with OTC meds. Will try Sonata. Discussed potential risks of this medication. Follow up 4 weeks and prn.

## 2015-05-18 NOTE — Assessment & Plan Note (Signed)
Repeat CBC with labs today. 

## 2015-05-18 NOTE — Assessment & Plan Note (Signed)
Renal function with labs.

## 2015-05-18 NOTE — Progress Notes (Signed)
Pre visit review using our clinic review tool, if applicable. No additional management support is needed unless otherwise documented below in the visit note. 

## 2015-05-18 NOTE — Assessment & Plan Note (Signed)
BP Readings from Last 3 Encounters:  05/18/15 130/84  11/01/14 110/60  05/05/14 110/80   BP well controlled. Renal function with labs today.

## 2015-05-18 NOTE — Progress Notes (Signed)
Subjective:    Patient ID: William Taylor, male    DOB: 10/19/1929, 81 y.o.   MRN: [id number]  HPI  81YO male presents for follow up.  Insomnia - Varies night to night. Some nights no problems. Others cannot fall asleep. Tried OTC benadryl and melatonin with no improvement. Sleeps max 3-4 hr.  Chronic external hemorrhoids - No recent bleeding or pain, however feels that it is more difficult to clean after BM. Would like to discuss hemorrhoidectomy with a surgeon.  Otherwise, feeling well. No CP, HA, palpitations. Compliant with medications.  Wt Readings from Last 3 Encounters:  05/18/15 226 lb 4 oz (102.626 kg)  11/01/14 227 lb 8 oz (103.193 kg)  05/05/14 224 lb 8 oz (101.833 kg)   BP Readings from Last 3 Encounters:  05/18/15 130/84  11/01/14 110/60  05/05/14 110/80    Past Medical History  Diagnosis Date  . Coronary artery disease   . Hypertension   . Hyperlipidemia   . Arrhythmia     A-Fib.  . Cancer     Prostate, followed by Dr. Jacqlyn Larsen  . MI (myocardial infarction)    Family History  Problem Relation Age of Onset  . Stroke Mother   . Colon cancer Father    Past Surgical History  Procedure Laterality Date  . Cataract extraction  Oct. 3, 2012    right eye  . Hernia repair  2013  . Lumbar spine surgery    . Colonoscopy    . Colonoscopy w/ polypectomy    . Coronary angioplasty  2009    2005; s/p stent  . Cardiac catheterization  10/2013    [facility]   Social History   Social History  . Marital Status: Married    Spouse Name: N/A  . Number of Children: N/A  . Years of Education: N/A   Social History Main Topics  . Smoking status: Never Smoker   . Smokeless tobacco: Never Used  . Alcohol Use: No  . Drug Use: No  . Sexual Activity: Not Asked   Other Topics Concern  . None   Social History Narrative    Review of Systems  Constitutional: Negative for fever, chills, activity change, appetite change, fatigue and unexpected weight change.  Eyes:  Negative for visual disturbance.  Respiratory: Negative for cough and shortness of breath.   Cardiovascular: Negative for chest pain, palpitations and leg swelling.  Gastrointestinal: Negative for nausea, vomiting, abdominal pain, diarrhea, constipation, blood in stool and abdominal distention.  Genitourinary: Negative for dysuria, urgency and difficulty urinating.  Musculoskeletal: Negative for myalgias, arthralgias and gait problem.  Skin: Negative for color change and rash.  Hematological: Negative for adenopathy.  Psychiatric/Behavioral: Positive for sleep disturbance. Negative for dysphoric mood. The patient is not nervous/anxious.        Objective:    BP 130/84 mmHg  Pulse 75  Temp(Src) 97.6 F (36.4 C) (Oral)  Ht 6\' 4"  (1.93 m)  Wt 226 lb 4 oz (102.626 kg)  BMI 27.55 kg/m2  SpO2 95% Physical Exam  Constitutional: He is oriented to person, place, and time. He appears well-developed and well-nourished. No distress.  HENT:  Head: Normocephalic and atraumatic.  Right Ear: External ear normal.  Left Ear: External ear normal.  Nose: Nose normal.  Mouth/Throat: Oropharynx is clear and moist. No oropharyngeal exudate.  Eyes: Conjunctivae and EOM are normal. Pupils are equal, round, and reactive to light. Right eye exhibits no discharge. Left eye exhibits no discharge. No scleral icterus.  Neck: Normal range of motion. Neck supple. No tracheal deviation present. No thyromegaly present.  Cardiovascular: Normal rate, regular rhythm and normal heart sounds.  Exam reveals no gallop and no friction rub.   No murmur heard. Pulmonary/Chest: Effort normal and breath sounds normal. No accessory muscle usage. No tachypnea. No respiratory distress. He has no decreased breath sounds. He has no wheezes. He has no rhonchi. He has no rales. He exhibits no tenderness.  Musculoskeletal: Normal range of motion. He exhibits no edema.  Lymphadenopathy:    He has no cervical adenopathy.  Neurological:  He is alert and oriented to person, place, and time. No cranial nerve deficit. Coordination normal.  Skin: Skin is warm and dry. No rash noted. He is not diaphoretic. No erythema. No pallor.  Psychiatric: He has a normal mood and affect. His behavior is normal. Judgment and thought content normal.          Assessment & Plan:   Problem List Items Addressed This Visit      Unprioritized   Absolute anemia    Repeat CBC with labs today.      Relevant Orders   CBC with Differential/Platelet   ATRIAL FIBRILLATION    NSR on exam today. Continue current medications. Continue anticoagulation with Xarelto.      Chronic kidney disease (CKD), stage III (moderate)    Renal function with labs.      Coronary artery disease    No recent chest pain. Compliant with medications. Follow up with cardiology as scheduled tomorrow.      External hemorrhoids    Chronic. He would like to proceed with evaluation for hemorrhoidectomy. Will set up referral.      Relevant Orders   Ambulatory referral to General Surgery   Hyperlipidemia - Primary    Will check lipids and LFTs with labs today. Continue Fenofibrate.      Relevant Orders   Comprehensive metabolic panel   Lipid panel   Hypertension    BP Readings from Last 3 Encounters:  05/18/15 130/84  11/01/14 110/60  05/05/14 110/80   BP well controlled. Renal function with labs today.      Insomnia    Chronic insomnia. No improvement with OTC meds. Will try Sonata. Discussed potential risks of this medication. Follow up 4 weeks and prn.          Return in about 4 weeks (around 06/15/2015) for Recheck.

## 2015-05-18 NOTE — Assessment & Plan Note (Signed)
No recent chest pain. Compliant with medications. Follow up with cardiology as scheduled tomorrow.

## 2015-05-18 NOTE — Assessment & Plan Note (Signed)
Will check lipids and LFTs with labs today. Continue Fenofibrate.

## 2015-05-18 NOTE — Assessment & Plan Note (Signed)
NSR on exam today. Continue current medications. Continue anticoagulation with Xarelto.

## 2015-05-18 NOTE — Patient Instructions (Addendum)
Labs today.  Start Sonata 5mg  at bedtime to help with sleep.  We will set up evaluation with Dr. Bary Castilla.

## 2015-05-18 NOTE — Addendum Note (Signed)
Addended by: Vernetta Honey on: 05/18/2015 11:48 AM   Modules accepted: Orders

## 2015-05-18 NOTE — Assessment & Plan Note (Signed)
Chronic. He would like to proceed with evaluation for hemorrhoidectomy. Will set up referral.

## 2015-05-19 ENCOUNTER — Encounter: Payer: Self-pay | Admitting: Cardiovascular Disease

## 2015-05-19 ENCOUNTER — Ambulatory Visit (INDEPENDENT_AMBULATORY_CARE_PROVIDER_SITE_OTHER): Payer: Medicare Other | Admitting: Cardiovascular Disease

## 2015-05-19 VITALS — BP 130/80 | HR 63 | Ht 76.0 in | Wt 226.0 lb

## 2015-05-19 DIAGNOSIS — I4891 Unspecified atrial fibrillation: Secondary | ICD-10-CM | POA: Diagnosis not present

## 2015-05-19 DIAGNOSIS — I10 Essential (primary) hypertension: Secondary | ICD-10-CM

## 2015-05-19 DIAGNOSIS — N183 Chronic kidney disease, stage 3 unspecified: Secondary | ICD-10-CM

## 2015-05-19 DIAGNOSIS — I2510 Atherosclerotic heart disease of native coronary artery without angina pectoris: Secondary | ICD-10-CM | POA: Diagnosis not present

## 2015-05-19 DIAGNOSIS — E785 Hyperlipidemia, unspecified: Secondary | ICD-10-CM

## 2015-05-19 DIAGNOSIS — Z7189 Other specified counseling: Secondary | ICD-10-CM

## 2015-05-19 NOTE — Assessment & Plan Note (Signed)
Currently with no symptoms of angina. No further workup at this time. Continue current medication regimen. 

## 2015-05-19 NOTE — Progress Notes (Signed)
Patient ID: William Taylor, male    DOB: Jan 08, 1930, 80 y.o.   MRN: [id number]  HPI Comments: William Taylor is a very pleasant 81 year old gentleman with a history of coronary artery disease, stent in 2005, atrial fibrillation with RVR in the setting of UTI September 2011, stent placement May 2012 to the OM 2, PE following catheterization started on anticoagulation, atrial fibrillation in 2013, who presents for follow-up of his coronary disease and atrial fibrillation History of anemia   in follow-up, he reports that he is doing well. Denies any tachycardia concerning for arrhythmia. No chest discomfort or shortness of breath concerning for angina He reports his blood pressure has been relatively well-controlled. Tolerating anticoagulation without excessive bleeding or bruising On review of systems he does report hearing loss, shortness of breath with activity, difficulty urinating Blood work reviewed with him showing stable blood count  EKG on today's visit shows normal sinus rhythm with rate 63 bpm, no significant ST or T-wave changes    other past medical history catheterization in 2009 showing patent stent with no other significant disease, normal systolic function in September 2011 who presented to the emergency room September 2011 with malaise, atrial fibrillation with RVR in the setting of a urinary tract infection, recent admission to the hospital November 14 2010 for atrial fibrillation that developed after pushing mowing with some chest pain. He converted back to normal sinus rhythm, Presenting to [facility] with chest discomfort with cardiac catheterization Dec 25 2010, showing 90% OM 2 disease, transferred to [facility]  with DES stent placed, 2.25 x 16 mm POMUS stent, with flank pain developing after discharge, admitted to [facility] and diagnosed with pulmonary embolism with heavy clot burden.   Found to be in atrial fibrillation in 2013. He had symptoms of shortness of breath with exertion.  After  one month on xarelto, amiodarone load and titration downward was started with conversion to NSR in October 2013  EKG the end of October 2013 confirmed normal sinus rhythm.   History of back surgery in 2008, history of prostate cancer followed by William Taylor   He was admitted to the hospital 10/27/2013 with chest pain. He had held his Plavix for colonoscopy 10/26/2013 with polypectomy x3. Cardiac catheterization performed given his severe chest pain and no disease showing severe small vessel disease, patent stents Amiodarone was held for bradycardia, he was started on isosorbide 30 mg daily, Plavix was held, he was continued on aspirin and xarelto.  Cardiac catheterization results; 90% disease at the ostium of a small diagonal vessel, small OM vessel with 80% disease in the proximal region Otherwise stents are patent  Echocardiogram 10/28/2013 shows normal LV function, mildly dilated left atrium, mild aortic regurg, normal right ventricular systolic pressures  Allergies  Allergen Reactions  . Warfarin And Related     Joints ache.    Outpatient Encounter Prescriptions as of 05/19/2015  Medication Sig  . cyproheptadine (PERIACTIN) 4 MG tablet Take 1 tablet (4 mg total) by mouth 3 (three) times daily as needed for allergies.  Marland Kitchen diltiazem (CARTIA XT) 240 MG 24 hr capsule Take 1 capsule (240 mg total) by mouth daily.  Marland Kitchen esomeprazole (NEXIUM) 40 MG capsule TAKE 1 CAPSULE DAILY AT 12 NOON  . fenofibrate micronized (ANTARA) 130 MG capsule TAKE 1 CAPSULE DAILY BEFORE BREAKFAST  . ferrous fumarate (HEMOCYTE - 106 MG FE) 325 (106 FE) MG TABS tablet Take 1 tablet by mouth 3 (three) times daily.  . finasteride (PROSCAR) 5 MG tablet Take  5 mg by mouth daily.    . isosorbide mononitrate (IMDUR) 30 MG 24 hr tablet TAKE 1 TABLET DAILY  . levobunolol (BETAGAN) 0.5 % ophthalmic solution 1 drop 2 (two) times daily.   Marland Kitchen lisinopril (PRINIVIL,ZESTRIL) 5 MG tablet TAKE 1 TABLET DAILY  . metoprolol (LOPRESSOR) 50  MG tablet TAKE ONE-HALF (1/2) TABLET (25 MG) TWICE A DAY  . NITROSTAT 0.4 MG SL tablet DISSOLVE 1 TABLET UNDER THE TONGUE EVERY 5 MINUTES AS NEEDED FOR CHEST PAIN  . Omega-3 Fatty Acids (FISH OIL) 1000 MG CAPS Take 1,000 mg by mouth daily.  . pravastatin (PRAVACHOL) 40 MG tablet Take 1 tablet (40 mg total) by mouth at bedtime.  . Tamsulosin HCl (FLOMAX) 0.4 MG CAPS Take 0.4 mg by mouth daily.  Alveda Reasons 20 MG TABS tablet TAKE 1 TABLET DAILY  . zaleplon (SONATA) 5 MG capsule Take 1-2 capsules (5-10 mg total) by mouth at bedtime as needed for sleep.   No facility-administered encounter medications on file as of 05/19/2015.    Past Medical History  Diagnosis Date  . Coronary artery disease   . Hypertension   . Hyperlipidemia   . Arrhythmia     A-Fib.  . Cancer     Prostate, followed by William Taylor  . MI (myocardial infarction)     Past Surgical History  Procedure Laterality Date  . Cataract extraction  Oct. 3, 2012    right eye  . Hernia repair  2013  . Lumbar spine surgery    . Colonoscopy    . Colonoscopy w/ polypectomy    . Coronary angioplasty  2009    2005; s/p stent  . Cardiac catheterization  10/2013    [facility]    Social History  reports that he has never smoked. He has never used smokeless tobacco. He reports that he does not drink alcohol or use illicit drugs.  Family History family history includes Colon cancer in his father; Stroke in his mother.     Review of Systems  Constitutional: Negative.   Respiratory: Negative.   Cardiovascular: Negative.   Gastrointestinal: Negative.   Musculoskeletal: Positive for back pain.  Skin: Negative.   Neurological: Negative.   Hematological: Negative.   Psychiatric/Behavioral: Negative.   All other systems reviewed and are negative.  BP 130/80 mmHg  Pulse 63  Ht 6\' 4"  (1.93 m)  Wt 226 lb (102.513 kg)  BMI 27.52 kg/m2  Physical Exam  Constitutional: He is oriented to person, place, and time. He appears well-developed  and well-nourished.  HENT:  Head: Normocephalic.  Nose: Nose normal.  Mouth/Throat: Oropharynx is clear and moist.  Eyes: Conjunctivae are normal. Pupils are equal, round, and reactive to light.  Neck: Normal range of motion. Neck supple. No JVD present.  Cardiovascular: S1 normal, S2 normal, normal heart sounds and intact distal pulses.  An irregularly irregular rhythm present. Bradycardia present.  Exam reveals no gallop and no friction rub.   No murmur heard. Pulmonary/Chest: Effort normal and breath sounds normal. No respiratory distress. He has no wheezes. He has no rales. He exhibits no tenderness.  Abdominal: Soft. Bowel sounds are normal. He exhibits no distension. There is no tenderness.  Musculoskeletal: Normal range of motion. He exhibits no edema or tenderness.  Lymphadenopathy:    He has no cervical adenopathy.  Neurological: He is alert and oriented to person, place, and time. Coordination normal.  Skin: Skin is warm and dry. No rash noted. No erythema.  Psychiatric: He has a normal  mood and affect. His behavior is normal. Judgment and thought content normal.      Assessment and Plan   Nursing note and vitals reviewed.

## 2015-05-19 NOTE — Assessment & Plan Note (Signed)
Encouraged him to stand his anticoagulation given history of paroxysmal atrial fibrillation Rhythm control with diltiazem, metoprolol

## 2015-05-19 NOTE — Patient Instructions (Signed)
You are doing well. No medication changes were made.  Please call us if you have new issues that need to be addressed before your next appt.  Your physician wants you to follow-up in: 6 months.  You will receive a reminder letter in the mail two months in advance. If you don't receive a letter, please call our office to schedule the follow-up appointment.   

## 2015-05-19 NOTE — Assessment & Plan Note (Signed)
Cholesterol is at goal on the current lipid regimen. No changes to the medications were made. Results discussed with him in detail today

## 2015-05-19 NOTE — Assessment & Plan Note (Signed)
Stable renal function on lab work performed yesterday. Results discussed with him

## 2015-05-19 NOTE — Assessment & Plan Note (Signed)
Blood pressure is well controlled on today's visit. No changes made to the medications. 

## 2015-05-19 NOTE — Assessment & Plan Note (Signed)
As detailed above, tolerating anticoagulation well. Continue given paroxysmal atrial fibrillation, numerous episodes over the past several years

## 2015-05-29 DIAGNOSIS — M5136 Other intervertebral disc degeneration, lumbar region: Secondary | ICD-10-CM | POA: Diagnosis not present

## 2015-05-29 DIAGNOSIS — M5134 Other intervertebral disc degeneration, thoracic region: Secondary | ICD-10-CM | POA: Diagnosis not present

## 2015-05-29 DIAGNOSIS — M9903 Segmental and somatic dysfunction of lumbar region: Secondary | ICD-10-CM | POA: Diagnosis not present

## 2015-05-29 DIAGNOSIS — M9902 Segmental and somatic dysfunction of thoracic region: Secondary | ICD-10-CM | POA: Diagnosis not present

## 2015-05-31 ENCOUNTER — Encounter: Payer: Self-pay | Admitting: General Surgery

## 2015-05-31 ENCOUNTER — Ambulatory Visit (INDEPENDENT_AMBULATORY_CARE_PROVIDER_SITE_OTHER): Payer: Medicare Other | Admitting: General Surgery

## 2015-05-31 VITALS — BP 132/82 | HR 70 | Resp 14 | Ht 76.0 in | Wt 225.0 lb

## 2015-05-31 DIAGNOSIS — I2510 Atherosclerotic heart disease of native coronary artery without angina pectoris: Secondary | ICD-10-CM | POA: Diagnosis not present

## 2015-05-31 DIAGNOSIS — K648 Other hemorrhoids: Secondary | ICD-10-CM | POA: Diagnosis not present

## 2015-05-31 DIAGNOSIS — K644 Residual hemorrhoidal skin tags: Secondary | ICD-10-CM

## 2015-05-31 NOTE — Patient Instructions (Addendum)
The patient is aware to call back for any questions or concerns. Monitor symptoms and call if symptoms worsen or new issues arise.  Hemorrhoids Hemorrhoids are swollen veins around the rectum or anus. There are two types of hemorrhoids:   Internal hemorrhoids. These occur in the veins just inside the rectum. They may poke through to the outside and become irritated and painful.  External hemorrhoids. These occur in the veins outside the anus and can be felt as a painful swelling or hard lump near the anus. CAUSES  Pregnancy.   Obesity.   Constipation or diarrhea.   Straining to have a bowel movement.   Sitting for long periods on the toilet.  Heavy lifting or other activity that caused you to strain.  Anal intercourse. SYMPTOMS   Pain.   Anal itching or irritation.   Rectal bleeding.   Fecal leakage.   Anal swelling.   One or more lumps around the anus.  DIAGNOSIS  Your caregiver may be able to diagnose hemorrhoids by visual examination. Other examinations or tests that may be performed include:   Examination of the rectal area with a gloved hand (digital rectal exam).   Examination of anal canal using a small tube (scope).   A blood test if you have lost a significant amount of blood.  A test to look inside the colon (sigmoidoscopy or colonoscopy). TREATMENT Most hemorrhoids can be treated at home. However, if symptoms do not seem to be getting better or if you have a lot of rectal bleeding, your caregiver may perform a procedure to help make the hemorrhoids get smaller or remove them completely. Possible treatments include:   Placing a rubber band at the base of the hemorrhoid to cut off the circulation (rubber band ligation).   Injecting a chemical to shrink the hemorrhoid (sclerotherapy).   Using a tool to burn the hemorrhoid (infrared light therapy).   Surgically removing the hemorrhoid (hemorrhoidectomy).   Stapling the hemorrhoid to  block blood flow to the tissue (hemorrhoid stapling).  HOME CARE INSTRUCTIONS   Eat foods with fiber, such as whole grains, beans, nuts, fruits, and vegetables. Ask your doctor about taking products with added fiber in them (fibersupplements).  Increase fluid intake. Drink enough water and fluids to keep your urine clear or pale yellow.   Exercise regularly.   Go to the bathroom when you have the urge to have a bowel movement. Do not wait.   Avoid straining to have bowel movements.   Keep the anal area dry and clean. Use wet toilet paper or moist towelettes after a bowel movement.   Medicated creams and suppositories may be used or applied as directed.   Only take over-the-counter or prescription medicines as directed by your caregiver.   Take warm sitz baths for 15-20 minutes, 3-4 times a day to ease pain and discomfort.   Place ice packs on the hemorrhoids if they are tender and swollen. Using ice packs between sitz baths may be helpful.   Put ice in a plastic bag.   Place a towel between your skin and the bag.   Leave the ice on for 15-20 minutes, 3-4 times a day.   Do not use a donut-shaped pillow or sit on the toilet for long periods. This increases blood pooling and pain.  SEEK MEDICAL CARE IF:  You have increasing pain and swelling that is not controlled by treatment or medicine.  You have uncontrolled bleeding.  You have difficulty or you are unable  to have a bowel movement.  You have pain or inflammation outside the area of the hemorrhoids. MAKE SURE YOU:  Understand these instructions.  Will watch your condition.  Will get help right away if you are not doing well or get worse.   This information is not intended to replace advice given to you by your health care provider. Make sure you discuss any questions you have with your health care provider.   Document Released: 08/02/2000 Document Revised: 07/22/2012 Document Reviewed:  06/09/2012 Elsevier Interactive Patient Education [employer].

## 2015-05-31 NOTE — Progress Notes (Signed)
Patient ID: William Taylor, male   DOB: November 03, 1929, 81 y.o.   MRN: [id number]  Chief Complaint  Patient presents with  . Rectal Problems    hemorrhoids    HPI William Taylor is a 81 y.o. male.  Here today for evaluation of external hemorrhoids that he has for a few years.. He states he can tell that the hemorrhoids are back there and it is difficult to get himself clean after a BM. He has noticed rare bleeding. His bowels move every other day. Denies rectal pain. He does notice that if he has diarrhea they will become more irritated. His father passed from colon cancer. He said that he had an MI after his colonoscopy last year because he had to stop his Plavix. He is now in Xarelto an has just recently seen Dr. Rockey Situ. He has occasional angina.  HPI  Past Medical History  Diagnosis Date  . Coronary artery disease   . Hypertension   . Hyperlipidemia   . Arrhythmia     A-Fib.  . Cancer [facility])     Prostate, followed by Dr. Jacqlyn Larsen  . MI (myocardial infarction) ([place])     2015  . Skin cancer   . Atrial fibrillation ([place])   . Colon polyp     Past Surgical History  Procedure Laterality Date  . Cataract extraction  Oct. 3, 2012    right eye  . Hernia repair  2013  . Lumbar spine surgery    . Colonoscopy    . Colonoscopy w/ polypectomy  2015    Dr Rayann Heman  . Coronary angioplasty  2009    2005; s/p stent  . Cardiac catheterization  10/2013    [facility]  . Upper gi endoscopy  Sept 2015    Dr Rayann Heman    Family History  Problem Relation Age of Onset  . Stroke Mother   . Colon cancer Father     Social History Social History  Substance Use Topics  . Smoking status: Never Smoker   . Smokeless tobacco: Never Used  . Alcohol Use: No    Allergies  Allergen Reactions  . Warfarin And Related     Joints ache.    Current Outpatient Prescriptions  Medication Sig Dispense Refill  . cyproheptadine (PERIACTIN) 4 MG tablet Take 1 tablet (4 mg total) by mouth 3 (three) times daily as  needed for allergies. 90 tablet 3  . diltiazem (CARTIA XT) 240 MG 24 hr capsule Take 1 capsule (240 mg total) by mouth daily. 90 capsule 2  . esomeprazole (NEXIUM) 40 MG capsule TAKE 1 CAPSULE DAILY AT 12 NOON 90 capsule 2  . fenofibrate micronized (ANTARA) 130 MG capsule TAKE 1 CAPSULE DAILY BEFORE BREAKFAST 90 capsule 3  . ferrous fumarate (HEMOCYTE - 106 MG FE) 325 (106 FE) MG TABS tablet Take 1 tablet by mouth 3 (three) times daily.    . finasteride (PROSCAR) 5 MG tablet Take 5 mg by mouth daily.      . isosorbide mononitrate (IMDUR) 30 MG 24 hr tablet TAKE 1 TABLET DAILY 90 tablet 3  . levobunolol (BETAGAN) 0.5 % ophthalmic solution 1 drop 2 (two) times daily.     Marland Kitchen lisinopril (PRINIVIL,ZESTRIL) 5 MG tablet TAKE 1 TABLET DAILY 90 tablet 3  . metoprolol (LOPRESSOR) 50 MG tablet TAKE ONE-HALF (1/2) TABLET (25 MG) TWICE A DAY 90 tablet 3  . NITROSTAT 0.4 MG SL tablet DISSOLVE 1 TABLET UNDER THE TONGUE EVERY 5 MINUTES AS NEEDED FOR CHEST  PAIN 1 tablet 5  . Omega-3 Fatty Acids (FISH OIL) 1000 MG CAPS Take 1,000 mg by mouth daily.    . pravastatin (PRAVACHOL) 40 MG tablet Take 1 tablet (40 mg total) by mouth at bedtime. 90 tablet 2  . Tamsulosin HCl (FLOMAX) 0.4 MG CAPS Take 0.4 mg by mouth daily.    Alveda Reasons 20 MG TABS tablet TAKE 1 TABLET DAILY 90 tablet 3  . zaleplon (SONATA) 5 MG capsule Take 1-2 capsules (5-10 mg total) by mouth at bedtime as needed for sleep. 60 capsule 3   No current facility-administered medications for this visit.    Review of Systems Review of Systems  Constitutional: Negative.   Respiratory: Negative.   Cardiovascular: Negative.        The patient reports intermittent anginal symptoms. These do not occur with activity, usually occur at rest. Relief with nitroglycerin. Rare infrequency by report.  Gastrointestinal: Positive for anal bleeding.    Blood pressure 132/82, pulse 70, resp. rate 14, height 6\' 4"  (1.93 m), weight 225 lb (102.059 kg).  Physical  Exam Physical Exam  Constitutional: He is oriented to person, place, and time. He appears well-developed and well-nourished.  HENT:  Mouth/Throat: Oropharynx is clear and moist.  Eyes: Conjunctivae are normal. No scleral icterus.  Neck: Neck supple.  Cardiovascular: Normal rate, regular rhythm and normal heart sounds.   Pulmonary/Chest: Effort normal and breath sounds normal.  Genitourinary: Rectal exam shows external hemorrhoid (prominent 1.5 cm nonulcerated perianal lesions at 2, 5 and 7:00 consistent with external hemorrhoids. No bleeding.), internal hemorrhoid and anal tone abnormal. Guaiac negative stool.     Lymphadenopathy:    He has no cervical adenopathy.  Neurological: He is alert and oriented to person, place, and time.  Skin: Skin is warm and dry.  Psychiatric: His behavior is normal.    Data Reviewed 04/28/2014 upper endoscopy for iron deficiency anemia was notable for pill fragments in the esophagus. Single gastric polyp.  Colonoscopy results from 10/26/2013 showed a tubular adenoma in the ascending colon as well as an inflammatory polyp. A tubular adenoma was identified in the transverse colon. Largest polyp recorded 6 mm. Internal hemorrhoids noted. Diverticulosis throughout the entire colon.  The patient's oral Xarelto and Plavix therapy had been held for 3 days prior to the procedure.  Admission note for 10/27/2013 when the patient presented with chest pain and was diagnosed with a non-STEMI MI. Chest CT negative for recurrent PE. Cardiac catheterization showed non-stent lesions.  Assessment    Internal and external hemorrhoids, symptomatic with difficulty of perianal cleansing. No history recurrent bleeding.    Plan    This patient is done poorly when anticoagulated therapy has an discontinued. The likelihood of excessive bleeding showed a hemorrhoidectomy considered is high. His main symptoms at present our difficulty with cleansing the perianal area.  Unless  he becomes more symptomatic I would discourage operative intervention. He is not a candidate for hemorrhoid banding of the internal hemorrhoid as it is asymptomatic at present.     Monitor symptoms and call if symptoms worsen or new issues arise.  PCP:  Sonnie Alamo 06/01/2015, 7:21 AM

## 2015-06-01 DIAGNOSIS — K644 Residual hemorrhoidal skin tags: Secondary | ICD-10-CM | POA: Insufficient documentation

## 2015-06-01 DIAGNOSIS — K648 Other hemorrhoids: Secondary | ICD-10-CM | POA: Insufficient documentation

## 2015-06-13 DIAGNOSIS — H401112 Primary open-angle glaucoma, right eye, moderate stage: Secondary | ICD-10-CM | POA: Diagnosis not present

## 2015-06-26 DIAGNOSIS — M9903 Segmental and somatic dysfunction of lumbar region: Secondary | ICD-10-CM | POA: Diagnosis not present

## 2015-06-26 DIAGNOSIS — M5134 Other intervertebral disc degeneration, thoracic region: Secondary | ICD-10-CM | POA: Diagnosis not present

## 2015-06-26 DIAGNOSIS — M9902 Segmental and somatic dysfunction of thoracic region: Secondary | ICD-10-CM | POA: Diagnosis not present

## 2015-06-26 DIAGNOSIS — M5136 Other intervertebral disc degeneration, lumbar region: Secondary | ICD-10-CM | POA: Diagnosis not present

## 2015-07-06 ENCOUNTER — Emergency Department: Payer: Medicare Other

## 2015-07-06 ENCOUNTER — Observation Stay
Admission: EM | Admit: 2015-07-06 | Discharge: 2015-07-07 | Disposition: A | Discharge status: Home / Self Care | Payer: Medicare Other | Attending: Internal Medicine | Admitting: Internal Medicine

## 2015-07-06 ENCOUNTER — Encounter: Payer: Self-pay | Admitting: Internal Medicine

## 2015-07-06 DIAGNOSIS — I480 Paroxysmal atrial fibrillation: Secondary | ICD-10-CM | POA: Diagnosis not present

## 2015-07-06 DIAGNOSIS — N183 Chronic kidney disease, stage 3 (moderate): Secondary | ICD-10-CM | POA: Insufficient documentation

## 2015-07-06 DIAGNOSIS — Z800 Family history of malignant neoplasm of digestive organs: Secondary | ICD-10-CM | POA: Diagnosis not present

## 2015-07-06 DIAGNOSIS — Z79899 Other long term (current) drug therapy: Secondary | ICD-10-CM | POA: Diagnosis not present

## 2015-07-06 DIAGNOSIS — I252 Old myocardial infarction: Secondary | ICD-10-CM | POA: Diagnosis not present

## 2015-07-06 DIAGNOSIS — Z9841 Cataract extraction status, right eye: Secondary | ICD-10-CM | POA: Diagnosis not present

## 2015-07-06 DIAGNOSIS — I739 Peripheral vascular disease, unspecified: Secondary | ICD-10-CM | POA: Diagnosis not present

## 2015-07-06 DIAGNOSIS — Z85828 Personal history of other malignant neoplasm of skin: Secondary | ICD-10-CM | POA: Diagnosis not present

## 2015-07-06 DIAGNOSIS — I2510 Atherosclerotic heart disease of native coronary artery without angina pectoris: Secondary | ICD-10-CM | POA: Insufficient documentation

## 2015-07-06 DIAGNOSIS — N400 Enlarged prostate without lower urinary tract symptoms: Secondary | ICD-10-CM | POA: Insufficient documentation

## 2015-07-06 DIAGNOSIS — R0789 Other chest pain: Secondary | ICD-10-CM | POA: Diagnosis not present

## 2015-07-06 DIAGNOSIS — I1310 Hypertensive heart and chronic kidney disease without heart failure, with stage 1 through stage 4 chronic kidney disease, or unspecified chronic kidney disease: Secondary | ICD-10-CM | POA: Diagnosis not present

## 2015-07-06 DIAGNOSIS — Z955 Presence of coronary angioplasty implant and graft: Secondary | ICD-10-CM | POA: Insufficient documentation

## 2015-07-06 DIAGNOSIS — Z9889 Other specified postprocedural states: Secondary | ICD-10-CM | POA: Insufficient documentation

## 2015-07-06 DIAGNOSIS — I119 Hypertensive heart disease without heart failure: Secondary | ICD-10-CM | POA: Insufficient documentation

## 2015-07-06 DIAGNOSIS — Z7901 Long term (current) use of anticoagulants: Secondary | ICD-10-CM | POA: Insufficient documentation

## 2015-07-06 DIAGNOSIS — E785 Hyperlipidemia, unspecified: Secondary | ICD-10-CM | POA: Diagnosis not present

## 2015-07-06 DIAGNOSIS — R000 Tachycardia, unspecified: Secondary | ICD-10-CM | POA: Diagnosis not present

## 2015-07-06 DIAGNOSIS — Z888 Allergy status to other drugs, medicaments and biological substances status: Secondary | ICD-10-CM | POA: Insufficient documentation

## 2015-07-06 DIAGNOSIS — K219 Gastro-esophageal reflux disease without esophagitis: Secondary | ICD-10-CM | POA: Diagnosis not present

## 2015-07-06 DIAGNOSIS — Z823 Family history of stroke: Secondary | ICD-10-CM | POA: Insufficient documentation

## 2015-07-06 DIAGNOSIS — C61 Malignant neoplasm of prostate: Secondary | ICD-10-CM | POA: Insufficient documentation

## 2015-07-06 DIAGNOSIS — I10 Essential (primary) hypertension: Secondary | ICD-10-CM

## 2015-07-06 DIAGNOSIS — Z86010 Personal history of colonic polyps: Secondary | ICD-10-CM | POA: Diagnosis not present

## 2015-07-06 DIAGNOSIS — R079 Chest pain, unspecified: Secondary | ICD-10-CM | POA: Diagnosis not present

## 2015-07-06 DIAGNOSIS — I4891 Unspecified atrial fibrillation: Secondary | ICD-10-CM | POA: Diagnosis not present

## 2015-07-06 HISTORY — DX: Paroxysmal atrial fibrillation: I48.0

## 2015-07-06 LAB — COMPREHENSIVE METABOLIC PANEL
ALBUMIN: 4.1 g/dL (ref 3.5–5.0)
ALT: 13 U/L — ABNORMAL LOW (ref 17–63)
AST: 21 U/L (ref 15–41)
Alkaline Phosphatase: 35 U/L — ABNORMAL LOW (ref 38–126)
Anion gap: 8 (ref 5–15)
BUN: 24 mg/dL — AB (ref 6–20)
CHLORIDE: 106 mmol/L (ref 101–111)
CO2: 26 mmol/L (ref 22–32)
Calcium: 9.5 mg/dL (ref 8.9–10.3)
Creatinine, Ser: 1.19 mg/dL (ref 0.61–1.24)
GFR calc Af Amer: >60 mL/min (ref >60)
GFR, EST NON AFRICAN AMERICAN: 55 mL/min — AB (ref >60)
Glucose, Bld: 155 mg/dL — ABNORMAL HIGH (ref 65–99)
POTASSIUM: 3.6 mmol/L (ref 3.5–5.1)
SODIUM: 140 mmol/L (ref 135–145)
Total Bilirubin: 0.8 mg/dL (ref 0.3–1.2)
Total Protein: 7.2 g/dL (ref 6.5–8.1)

## 2015-07-06 LAB — CBC
HCT: 38.8 % — ABNORMAL LOW (ref 40.0–52.0)
Hemoglobin: 12.9 g/dL — ABNORMAL LOW (ref 13.0–18.0)
MCH: 32.1 pg (ref 26.0–34.0)
MCHC: 33.3 g/dL (ref 32.0–36.0)
MCV: 96.4 fL (ref 80.0–100.0)
PLATELETS: 195 10*3/uL (ref 150–440)
RBC: 4.02 MIL/uL — AB (ref 4.40–5.90)
RDW: 13.7 % (ref 11.5–14.5)
WBC: 9 10*3/uL (ref 3.8–10.6)

## 2015-07-06 LAB — TROPONIN I: TROPONIN I: 0.03 ng/mL (ref <0.031)

## 2015-07-06 MED ORDER — DILTIAZEM HCL ER COATED BEADS 240 MG PO CP24
240.0000 mg | ORAL_CAPSULE | Freq: Every day | ORAL | Status: DC
  Administered 2015-07-07: 240 mg via ORAL
  Filled 2015-07-06: qty 1

## 2015-07-06 MED ORDER — FINASTERIDE 5 MG PO TABS
5.0000 mg | ORAL_TABLET | Freq: Every day | ORAL | Status: DC
  Administered 2015-07-07: 5 mg via ORAL
  Filled 2015-07-06: qty 1

## 2015-07-06 MED ORDER — ACETAMINOPHEN 650 MG RE SUPP
650.0000 mg | Freq: Four times a day (QID) | RECTAL | Status: DC | PRN
Start: 2015-07-06 — End: 2015-07-07

## 2015-07-06 MED ORDER — METOPROLOL TARTRATE 25 MG PO TABS
25.0000 mg | ORAL_TABLET | Freq: Two times a day (BID) | ORAL | Status: DC
  Administered 2015-07-07: 25 mg via ORAL
  Filled 2015-07-06: qty 1

## 2015-07-06 MED ORDER — LISINOPRIL 5 MG PO TABS
5.0000 mg | ORAL_TABLET | Freq: Every day | ORAL | Status: DC
  Administered 2015-07-07: 5 mg via ORAL
  Filled 2015-07-06: qty 1

## 2015-07-06 MED ORDER — ONDANSETRON HCL 4 MG/2ML IJ SOLN: 4.0000 mg | Freq: Four times a day (QID) | INTRAMUSCULAR | Status: DC | PRN

## 2015-07-06 MED ORDER — ISOSORBIDE MONONITRATE ER 30 MG PO TB24
30.0000 mg | ORAL_TABLET | Freq: Every day | ORAL | Status: DC
  Administered 2015-07-07: 30 mg via ORAL
  Filled 2015-07-06: qty 1

## 2015-07-06 MED ORDER — RIVAROXABAN 20 MG PO TABS
20.0000 mg | ORAL_TABLET | Freq: Every day | ORAL | Status: DC
  Administered 2015-07-07: 20 mg via ORAL
  Filled 2015-07-06: qty 1

## 2015-07-06 MED ORDER — HEPARIN SODIUM (PORCINE) 5000 UNIT/ML IJ SOLN: 5000.0000 [IU] | Freq: Three times a day (TID) | INTRAMUSCULAR | Status: DC

## 2015-07-06 MED ORDER — NITROGLYCERIN 0.4 MG SL SUBL
0.4000 mg | SUBLINGUAL_TABLET | SUBLINGUAL | Status: DC | PRN
Start: 2015-07-06 — End: 2015-07-07
  Administered 2015-07-07: 0.4 mg via SUBLINGUAL
  Filled 2015-07-06: qty 1

## 2015-07-06 MED ORDER — PANTOPRAZOLE SODIUM 40 MG PO TBEC
40.0000 mg | DELAYED_RELEASE_TABLET | Freq: Every day | ORAL | Status: DC
  Administered 2015-07-07: 40 mg via ORAL
  Filled 2015-07-06: qty 1

## 2015-07-06 MED ORDER — ONDANSETRON HCL 4 MG PO TABS: 4.0000 mg | ORAL_TABLET | Freq: Four times a day (QID) | ORAL | Status: DC | PRN

## 2015-07-06 MED ORDER — NITROGLYCERIN 2 % TD OINT
1.0000 [in_us] | TOPICAL_OINTMENT | Freq: Once | TRANSDERMAL | Status: AC
  Administered 2015-07-06: 1 [in_us] via TOPICAL
  Filled 2015-07-06: qty 1

## 2015-07-06 MED ORDER — CYPROHEPTADINE HCL 4 MG PO TABS: 4.0000 mg | ORAL_TABLET | Freq: Three times a day (TID) | ORAL | Status: DC | PRN

## 2015-07-06 MED ORDER — FERROUS FUMARATE 325 (106 FE) MG PO TABS
1.0000 | ORAL_TABLET | Freq: Three times a day (TID) | ORAL | Status: DC
  Administered 2015-07-07 (×2): 106 mg via ORAL
  Filled 2015-07-06 (×4): qty 1

## 2015-07-06 MED ORDER — TAMSULOSIN HCL 0.4 MG PO CAPS
0.4000 mg | ORAL_CAPSULE | Freq: Every day | ORAL | Status: DC
  Administered 2015-07-07: 0.4 mg via ORAL
  Filled 2015-07-06: qty 1

## 2015-07-06 MED ORDER — LEVOBUNOLOL HCL 0.5 % OP SOLN
1.0000 [drp] | Freq: Two times a day (BID) | OPHTHALMIC | Status: DC
  Filled 2015-07-06: qty 5

## 2015-07-06 MED ORDER — MORPHINE SULFATE (PF) 2 MG/ML IV SOLN: 2.0000 mg | INTRAVENOUS | Status: DC | PRN

## 2015-07-06 MED ORDER — ZOLPIDEM TARTRATE 5 MG PO TABS: 5.0000 mg | ORAL_TABLET | Freq: Every evening | ORAL | Status: DC | PRN

## 2015-07-06 MED ORDER — OXYCODONE HCL 5 MG PO TABS: 5.0000 mg | ORAL_TABLET | ORAL | Status: DC | PRN

## 2015-07-06 MED ORDER — SODIUM CHLORIDE 0.9 % IJ SOLN
3.0000 mL | Freq: Two times a day (BID) | INTRAMUSCULAR | Status: DC
  Administered 2015-07-07 (×2): 3 mL via INTRAVENOUS

## 2015-07-06 MED ORDER — PRAVASTATIN SODIUM 40 MG PO TABS
40.0000 mg | ORAL_TABLET | Freq: Every day | ORAL | Status: DC
  Administered 2015-07-07: 40 mg via ORAL
  Filled 2015-07-06: qty 1

## 2015-07-06 MED ORDER — ACETAMINOPHEN 325 MG PO TABS: 650.0000 mg | ORAL_TABLET | Freq: Four times a day (QID) | ORAL | Status: DC | PRN

## 2015-07-06 NOTE — ED Provider Notes (Signed)
[facility] Emergency Department Provider Note     Time seen: ----------------------------------------- 7:56 PM on 07/06/2015 -----------------------------------------    I have reviewed the triage vital signs and the nursing notes.   HISTORY  Chief Complaint Chest Pain    HPI William Taylor is a 82 y.o. male who presents ER for sudden onset chest pressure that started tonight. Patient does not have any sweats, nausea, shortness of breath. Patient had chest pain once prior social with a heart attack and this feels different. Pain is central, he took 2 sublingual nitroglycerin prior to arrival which improved his symptoms.   Past Medical History  Diagnosis Date  . Coronary artery disease   . Hypertension   . Hyperlipidemia   . Arrhythmia     A-Fib.  . Cancer [facility])     Prostate, followed by Dr. Jacqlyn Larsen  . MI (myocardial infarction) ([place])     2015  . Skin cancer   . Atrial fibrillation ([place])   . Colon polyp     Patient Active Problem List   Diagnosis Date Noted  . External hemorrhoid 06/01/2015  . Internal hemorrhoid 06/01/2015  . Coronary artery disease 05/18/2015  . Absolute anemia 03/01/2014  . Hypomagnesemia 03/01/2014  . Urinary tract infection, site not specified 03/01/2014  . External hemorrhoids 09/23/2013  . Medicare annual wellness visit, subsequent 03/15/2013  . Prostate cancer ([place]) 03/15/2013  . Abnormal CT scan, chest 11/23/2012  . Insomnia 09/03/2012  . Hypertension 07/23/2012  . Elevated blood sugar 07/23/2012  . Chronic kidney disease (CKD), stage III (moderate) 07/23/2011  . Pulmonary embolism ([place]) 01/09/2011  . Encounter for anticoagulation discussion and counseling 01/09/2011  . ATRIAL FIBRILLATION 09/06/2010  . Hyperlipidemia 05/19/2009  . CAD, NATIVE VESSEL 05/19/2009    Past Surgical History  Procedure Laterality Date  . Cataract extraction  Oct. 3, 2012    right eye  . Hernia repair  2013  . Lumbar spine  surgery    . Colonoscopy    . Colonoscopy w/ polypectomy  2015    Dr Rayann Heman  . Coronary angioplasty  2009    2005; s/p stent  . Cardiac catheterization  10/2013    [facility]  . Upper gi endoscopy  Sept 2015    Dr Rayann Heman    Allergies Warfarin and related  Social History Social History  Substance Use Topics  . Smoking status: Never Smoker   . Smokeless tobacco: Never Used  . Alcohol Use: No    Review of Systems Constitutional: Negative for fever. Eyes: Negative for visual changes. ENT: Negative for sore throat. Cardiovascular: Positive for chest pain Respiratory: Negative for shortness of breath. Gastrointestinal: Negative for abdominal pain, vomiting and diarrhea. Genitourinary: Negative for dysuria. Musculoskeletal: Negative for back pain. Skin: Negative for rash. Neurological: Negative for headaches, focal weakness or numbness.  10-point ROS otherwise negative.  ____________________________________________   PHYSICAL EXAM:  VITAL SIGNS: ED Triage Vitals  Enc Vitals Group     BP 07/06/15 1948 131/71 mmHg     Pulse Rate 07/06/15 1948 108     Resp 07/06/15 1948 18     Temp --      Temp src --      SpO2 07/06/15 1948 93 %     Weight 07/06/15 1948 226 lb (102.513 kg)     Height 07/06/15 1948 6\' 4"  (1.93 m)     Head Cir --      Peak Flow --      Pain Score 07/06/15  1949 5     Pain Loc --      Pain Edu? --      Excl. in [place]? --     Constitutional: Alert and oriented. Well appearing and in no distress. Eyes: Conjunctivae are normal. PERRL. Normal extraocular movements. ENT   Head: Normocephalic and atraumatic.   Nose: No congestion/rhinnorhea.   Mouth/Throat: Mucous membranes are moist.   Neck: No stridor. Cardiovascular: Normal rate, regular rhythm. Normal and symmetric distal pulses are present in all extremities. No murmurs, rubs, or gallops. Respiratory: Normal respiratory effort without tachypnea nor retractions. Breath sounds are clear and equal  bilaterally. No wheezes/rales/rhonchi. Gastrointestinal: Soft and nontender. No distention. No abdominal bruits.  Musculoskeletal: Nontender with normal range of motion in all extremities. No joint effusions.  No lower extremity tenderness nor edema. Neurologic:  Normal speech and language. No gross focal neurologic deficits are appreciated. Speech is normal. No gait instability. Skin:  Skin is warm, dry and intact. No rash noted. Psychiatric: Mood and affect are normal. Speech and behavior are normal. Patient exhibits appropriate insight and judgment. ____________________________________________  EKG: Interpreted by me. A 2 fibrillation with a rapid ventricular response, rate is 127 bpm, normal axis. No evidence of acute infarction. Patient subsequently converted to a normal sinus rhythm with a normal rate.  ____________________________________________  ED COURSE:  Pertinent labs & imaging results that were available during my care of the patient were reviewed by me and considered in my medical decision making (see chart for details). Check basic labs, apply Nitropaste and reevaluate. ____________________________________________    LABS (pertinent positives/negatives)  Labs Reviewed  CBC - Abnormal; Notable for the following:    RBC 4.02 (*)    Hemoglobin 12.9 (*)    HCT 38.8 (*)    All other components within normal limits  COMPREHENSIVE METABOLIC PANEL - Abnormal; Notable for the following:    Glucose, Bld 155 (*)    BUN 24 (*)    ALT 13 (*)    Alkaline Phosphatase 35 (*)    GFR calc non Af Amer 55 (*)    All other components within normal limits  TROPONIN I  CBC  CREATININE, SERUM    RADIOLOGY   Chest x-ray Is unremarkable ____________________________________________  FINAL ASSESSMENT AND PLAN  Chest pain  Plan: Patient with labs and imaging as dictated above. Patient with chest pain improved with nitroglycerin. We'll recommend observation and cardiology  consultation.   Earleen Newport, MD   Earleen Newport, MD 07/06/15 [phone]

## 2015-07-06 NOTE — H&P (Signed)
William Taylor at [place] NAME: William Taylor    MR#:  [id number]  DATE OF BIRTH:  [birth year]   DATE OF ADMISSION:  07/06/2015  PRIMARY CARE PHYSICIAN: Ronette Deter, MD   REQUESTING/REFERRING PHYSICIAN: Williams  CHIEF COMPLAINT:   Chief Complaint  Patient presents with  . Chest Pain    HISTORY OF PRESENT ILLNESS:  William Taylor  is a 81 y.o. male with a known history of paroxysmal atrial fibrillation, coronary artery disease who is presenting with chest pain. He describes acute onset of chest pain described mainly as pressure over the retrosternal/left chest region, nonradiating, 6/10 intensity, no worsening or relieving factors associated shortness of breath on arrival to the emergency department found in nature fibrillation rapid ventricular response are in the 120s without intervention heart rate improved. His symptoms as far as chest pain is also resolved.  PAST MEDICAL HISTORY:   Past Medical History  Diagnosis Date  . Coronary artery disease   . Hypertension   . Hyperlipidemia   . Arrhythmia     A-Fib.  . Cancer [facility])     Prostate, followed by Dr. Jacqlyn Larsen  . MI (myocardial infarction) ([place])     2015  . Skin cancer   . Atrial fibrillation ([place])   . Colon polyp     PAST SURGICAL HISTORY:   Past Surgical History  Procedure Laterality Date  . Cataract extraction  Oct. 3, 2012    right eye  . Hernia repair  2013  . Lumbar spine surgery    . Colonoscopy    . Colonoscopy w/ polypectomy  2015    Dr Rayann Heman  . Coronary angioplasty  2009    2005; s/p stent  . Cardiac catheterization  10/2013    [facility]  . Upper gi endoscopy  Sept 2015    Dr Rayann Heman    SOCIAL HISTORY:   Social History  Substance Use Topics  . Smoking status: Never Smoker   . Smokeless tobacco: Never Used  . Alcohol Use: No    FAMILY HISTORY:   Family History  Problem Relation Age of Onset  . Stroke Mother   . Colon cancer Father     DRUG  ALLERGIES:   Allergies  Allergen Reactions  . Warfarin And Related Other (See Comments)    Chest pain    REVIEW OF SYSTEMS:  REVIEW OF SYSTEMS:  CONSTITUTIONAL: Denies fevers, chills, fatigue, weakness.  EYES: Denies blurred vision, double vision, or eye pain.  EARS, NOSE, THROAT: Denies tinnitus, ear pain, hearing loss.  RESPIRATORY: denies cough, positive shortness of breath, denies wheezing  CARDIOVASCULAR: Positive chest pain, denies palpitations, edema.  GASTROINTESTINAL: Denies nausea, vomiting, diarrhea, abdominal pain.  GENITOURINARY: Denies dysuria, hematuria.  ENDOCRINE: Denies nocturia or thyroid problems. HEMATOLOGIC AND LYMPHATIC: Denies easy bruising or bleeding.  SKIN: Denies rash or lesions.  MUSCULOSKELETAL: Denies pain in neck, back, shoulder, knees, hips, or further arthritic symptoms.  NEUROLOGIC: Denies paralysis, paresthesias.  PSYCHIATRIC: Denies anxiety or depressive symptoms. Otherwise full review of systems performed by me is negative.   MEDICATIONS AT HOME:   Prior to Admission medications   Medication Sig Start Date End Date Taking? Authorizing Provider  cyproheptadine (PERIACTIN) 4 MG tablet Take 1 tablet (4 mg total) by mouth 3 (three) times daily as needed for allergies. 05/18/15  Yes [name], MD  diltiazem (CARTIA XT) 240 MG 24 hr capsule Take 1 capsule (240 mg total) by mouth daily. 05/18/15  Yes  [name], MD  esomeprazole (NEXIUM) 40 MG capsule TAKE 1 CAPSULE DAILY AT 12 NOON 03/16/15  Yes [name], MD  fenofibrate micronized (ANTARA) 130 MG capsule TAKE 1 CAPSULE DAILY BEFORE BREAKFAST 12/30/14  Yes [name], MD  finasteride (PROSCAR) 5 MG tablet Take 5 mg by mouth daily.     Yes Historical Provider, MD  isosorbide mononitrate (IMDUR) 30 MG 24 hr tablet TAKE 1 TABLET DAILY 11/15/14  Yes [name], MD  levobunolol (BETAGAN) 0.5 % ophthalmic solution 1 drop 2 (two) times daily.    Yes Historical Provider, MD   lisinopril (PRINIVIL,ZESTRIL) 5 MG tablet TAKE 1 TABLET DAILY 10/10/14  Yes [name], MD  metoprolol (LOPRESSOR) 50 MG tablet TAKE ONE-HALF (1/2) TABLET (25 MG) TWICE A DAY 04/15/14  Yes [name], MD  NITROSTAT 0.4 MG SL tablet DISSOLVE 1 TABLET UNDER THE TONGUE EVERY 5 MINUTES AS NEEDED FOR CHEST PAIN 08/04/14  Yes [name], MD  Omega-3 Fatty Acids (FISH OIL) 1000 MG CAPS Take 1,000 mg by mouth daily.   Yes Historical Provider, MD  pravastatin (PRAVACHOL) 40 MG tablet Take 1 tablet (40 mg total) by mouth at bedtime. 05/18/15  Yes [name], MD  Tamsulosin HCl (FLOMAX) 0.4 MG CAPS Take 0.4 mg by mouth daily.   Yes Historical Provider, MD  XARELTO 20 MG TABS tablet TAKE 1 TABLET DAILY 08/04/14  Yes [name], MD  zaleplon (SONATA) 5 MG capsule Take 1-2 capsules (5-10 mg total) by mouth at bedtime as needed for sleep. 05/18/15  Yes [name], MD      VITAL SIGNS:  Blood pressure 143/91, pulse 87, resp. rate 16, height 6\' 4"  (1.93 m), weight 226 lb (102.513 kg), SpO2 96 %.  PHYSICAL EXAMINATION:  VITAL SIGNS: Filed Vitals:   07/06/15 2230  BP: 143/91  Pulse: 87  Resp: 16   GENERAL:81 y.o.male currently in no acute distress.  HEAD: Normocephalic, atraumatic.  EYES: Pupils equal, round, reactive to light. Extraocular muscles intact. No scleral icterus.  MOUTH: Moist mucosal membrane. Dentition intact. No abscess noted.  EAR, NOSE, THROAT: Clear without exudates. No external lesions.  NECK: Supple. No thyromegaly. No nodules. No JVD.  PULMONARY: Clear to ascultation, without wheeze rails or rhonci. No use of accessory muscles, Good respiratory effort. good air entry bilaterally CHEST: Nontender to palpation.  CARDIOVASCULAR: S1 and S2. Irregular rate and rhythm. No murmurs, rubs, or gallops. No edema. Pedal pulses 2+ bilaterally.  GASTROINTESTINAL: Soft, nontender, nondistended. No masses. Positive bowel sounds. No hepatosplenomegaly.   MUSCULOSKELETAL: No swelling, clubbing, or edema. Range of motion full in all extremities.  NEUROLOGIC: Cranial nerves II through XII are intact. No gross focal neurological deficits. Sensation intact. Reflexes intact.  SKIN: No ulceration, lesions, rashes, or cyanosis. Skin warm and dry. Turgor intact.  PSYCHIATRIC: Mood, affect within normal limits. The patient is awake, alert and oriented x 3. Insight, judgment intact.    LABORATORY PANEL:   CBC  Recent Labs Lab 07/06/15 2000  WBC 9.0  HGB 12.9*  HCT 38.8*  PLT 195   ------------------------------------------------------------------------------------------------------------------  Chemistries   Recent Labs Lab 07/06/15 2000  NA 140  K 3.6  CL 106  CO2 26  GLUCOSE 155*  BUN 24*  CREATININE 1.19  CALCIUM 9.5  AST 21  ALT 13*  ALKPHOS 35*  BILITOT 0.8   ------------------------------------------------------------------------------------------------------------------  Cardiac Enzymes  Recent Labs Lab 07/06/15 2000  TROPONINI 0.03   ------------------------------------------------------------------------------------------------------------------  RADIOLOGY:  Dg Chest Port 1 View  07/06/2015  CLINICAL DATA:  Acute onset of chest pain EXAM: PORTABLE CHEST - 1 VIEW COMPARISON:  02/27/2014 FINDINGS: Cardiac shadow is stable. The lungs are clear bilaterally. No focal infiltrate or sizable effusion is seen. No acute bony abnormality is noted. IMPRESSION: No active disease. Electronically Signed   By: Inez Catalina M.D.   On: 07/06/2015 20:23    EKG:   Orders placed or performed during the hospital encounter of 07/06/15  . EKG 12-Lead  . EKG 12-Lead  . ED EKG  . ED EKG    IMPRESSION AND PLAN:   81 year old Caucasian gentleman history of paroxysmal atrial fibrillation, coronary artery disease status post PCI stent placement who is presenting with chest pain  1.Chest pain, central: Initiate aspirin and statin  therapy, admitted to telemetry, trend cardiac enzymes 3,  if continued elevation will initiate heparin drip ,nitroglycerin when necessary, morphine when necessary, consult cardiology follows with [facility] 2. Atrial fibrillation rapid ventricular response: Without intervention heart rate now in the 80s on Xarelto for anticoagulation continue to monitor heart rate if increase will require Cardizem 3. GERD without esophagitis PPI therapy 4. BPH unspecified: Proscar 5. Venous thromboembolism prophylactic: Therapeutic Xarelto      All the records are reviewed and case discussed with ED provider. Management plans discussed with the patient, family and they are in agreement.  CODE STATUS: Full  TOTAL TIME TAKING CARE OF THIS PATIENT: 35 minutes.    Shonda Mandarino,  Karenann Cai.D on 07/06/2015 at 10:43 PM  Between 7am to 6pm - Pager - [phone]  After 6pm: House Pager: - [phone]  [facility]  Office  [phone]  CC: Primary care physician; Ronette Deter, MD

## 2015-07-06 NOTE — ED Notes (Signed)
Pt in with acute onset of chest pressure that started tonight, hx of MI.

## 2015-07-07 ENCOUNTER — Telehealth: Payer: Self-pay

## 2015-07-07 ENCOUNTER — Encounter: Payer: Self-pay | Admitting: Physician Assistant

## 2015-07-07 ENCOUNTER — Telehealth: Payer: Self-pay | Admitting: Internal Medicine

## 2015-07-07 ENCOUNTER — Encounter: Payer: Medicare Other | Attending: Physician Assistant

## 2015-07-07 DIAGNOSIS — R079 Chest pain, unspecified: Secondary | ICD-10-CM

## 2015-07-07 DIAGNOSIS — I480 Paroxysmal atrial fibrillation: Secondary | ICD-10-CM

## 2015-07-07 DIAGNOSIS — I4891 Unspecified atrial fibrillation: Secondary | ICD-10-CM | POA: Diagnosis not present

## 2015-07-07 DIAGNOSIS — N400 Enlarged prostate without lower urinary tract symptoms: Secondary | ICD-10-CM | POA: Diagnosis not present

## 2015-07-07 DIAGNOSIS — K219 Gastro-esophageal reflux disease without esophagitis: Secondary | ICD-10-CM | POA: Diagnosis not present

## 2015-07-07 LAB — NM MYOCAR MULTI W/SPECT W/WALL MOTION / EF
CHL CUP NUCLEAR SDS: 0
CHL CUP NUCLEAR SSS: 4
CHL CUP RESTING HR STRESS: 66 {beats}/min
CHL CUP STRESS STAGE 2 HR: 68 {beats}/min
CHL CUP STRESS STAGE 2 SBP: 159 mmHg
CHL CUP STRESS STAGE 2 SPEED: 0 mph
CHL CUP STRESS STAGE 3 GRADE: 0 %
CHL CUP STRESS STAGE 3 HR: 68 {beats}/min
CHL CUP STRESS STAGE 3 SPEED: 0 mph
CHL CUP STRESS STAGE 5 GRADE: 0 %
CHL CUP STRESS STAGE 5 SPEED: 0 mph
CHL CUP STRESS STAGE 6 DBP: 74 mmHg
CHL CUP STRESS STAGE 6 SBP: 137 mmHg
Estimated workload: 1 METS
LV dias vol: 50 mL
LV sys vol: 17 mL
Peak HR: 77 {beats}/min
Percent HR: 69 %
Percent of predicted max HR: 56 %
SRS: 12
Stage 1 HR: 71 {beats}/min
Stage 2 DBP: 96 mmHg
Stage 2 Grade: 0 %
Stage 4 Grade: 0 %
Stage 4 HR: 77 {beats}/min
Stage 4 Speed: 0 mph
Stage 5 HR: 94 {beats}/min
Stage 6 Grade: 0 %
Stage 6 Speed: 0 mph
TID: 0.88

## 2015-07-07 LAB — TROPONIN I

## 2015-07-07 MED ORDER — TECHNETIUM TC 99M SESTAMIBI - CARDIOLITE
12.1500 | Freq: Once | INTRAVENOUS | Status: AC | PRN
  Administered 2015-07-07: 12.15 via INTRAVENOUS

## 2015-07-07 MED ORDER — METOPROLOL TARTRATE 50 MG PO TABS: 50.0000 mg | ORAL_TABLET | Freq: Two times a day (BID) | ORAL | Status: DC

## 2015-07-07 MED ORDER — SODIUM CHLORIDE 0.9 % IV SOLN: Freq: Once | INTRAVENOUS | Status: DC

## 2015-07-07 MED ORDER — REGADENOSON 0.4 MG/5ML IV SOLN
0.4000 mg | Freq: Once | INTRAVENOUS | Status: AC
  Administered 2015-07-07: 0.4 mg via INTRAVENOUS

## 2015-07-07 MED ORDER — METOPROLOL TARTRATE 50 MG PO TABS: ORAL_TABLET | ORAL | Status: DC

## 2015-07-07 MED ORDER — TECHNETIUM TC 99M SESTAMIBI - CARDIOLITE
30.6200 | Freq: Once | INTRAVENOUS | Status: AC | PRN
Start: 2015-07-07 — End: 2015-07-07
  Administered 2015-07-07: 30.62 via INTRAVENOUS

## 2015-07-07 NOTE — Care Management (Signed)
Patient admitted under observation with chest pain.  Troponins are negative. Stress test pending.  has been seen by cardiology.   Patient presents from home and independent in all adls.  Denies issues accessing medical care, obtaining medications, maintaining housing, utilities and food.   No discharge needs identified at present time.

## 2015-07-07 NOTE — Consult Note (Signed)
Cardiology Consultation Note  Patient ID: William Taylor, MRN: [id number], DOB/AGE: 1930-08-09 81 y.o. Admit date: 07/06/2015   Date of Consult: 07/07/2015 Primary Physician: Ronette Deter, MD Primary Cardiologist: Dr. Rockey Situ, MD  Chief Complaint: Chest pain Reason for Consult: Chest pain with sinus tachycardia and PACs   HPI: 81 y.o. male with h/o CAD s/p multiple stents, PAF on Xarelto, CKD stage II-III, HTN, HLD, and prostate cancer who presented to [facility] on 11/17 with chest pain and palpitations.   He first underwent stenting in 2005, repeat cath in 2009 showing patent stent with no other disease, normal systolic function in September 2011 who presented to the emergency room September 2011 with malaise, atrial fibrillation with RVR in the setting of a urinary tract infection, prior admission to the hospital November 14 2010 for atrial fibrillation that developed after pushing mowing with some chest pain. He converted back to normal sinus rhythm, presented to [facility] with chest discomfort with cardiac catheterization Dec 25 2010, showing 90% OM 2 disease, transferred to [facility] with DES stent placed, 2.25 x 16 mm POMUS stent, with flank pain developing after discharge, admitted to [facility] and diagnosed with pulmonary embolism with heavy clot burden. He was found to be back in Afib in 2013 and transitioned to Xarelto from warfarin. Started on amiodarone and converted back to sinus rhythm by end of October 2013. He was admitted to the hospital 10/27/2013 with chest pain. Had held his Plavix 10/26/2013 for polypectomy x 3. Cardiac cath was performed given his symptoms that showed severe small vessel disease with 90% disease at the ostium of a small diagonal vessel, small OM vessel with 80% disease in the proximal region and patent stents. Echo showed normal LV function, mildly dilated left atrium, mild aortic regurg, normal right ventricular systolic pressures. Amiodarone was held 2/2 bradycardia, he was  started on Imdur, Plavix was held, and he was continued on aspirin and Xarelto.      After eating a large steak and baked potato dinner on the evening of 11/17 he developed palpitations with heart rate in the 150's. This was followed by the development of chest pain along the central region that did not radiate. There were no associated symptoms except for the palpitations. He typically does not feel palpitations when he has Afib, so this was unusual for him. Because of his tachycardic heart rate he presented to [facility], not because of his pain. He has not had any exertional symptoms.   Upon the patient's arrival to [facility] they were found to have negative troponin x 3, SCr 1.19, BUN 24, hgb 12.9, WBC 9.0. ECG showed sinus tachycardia, 127 bpm, with runs of PACs, inferolateral st depression, CXR showed no active disease. He is currently symptom free.    Past Medical History  Diagnosis Date  . Coronary artery disease   . Hypertension   . Hyperlipidemia   . Arrhythmia     A-Fib.  . Cancer [facility])     Prostate, followed by Dr. Jacqlyn Larsen  . MI (myocardial infarction) ([place])     2015  . Skin cancer   . Atrial fibrillation ([place])   . Colon polyp       Most Recent Cardiac Studies: As above   Surgical History:  Past Surgical History  Procedure Laterality Date  . Cataract extraction  Oct. 3, 2012    right eye  . Hernia repair  2013  . Lumbar spine surgery    . Colonoscopy    . Colonoscopy w/  polypectomy  2015    Dr Rayann Heman  . Coronary angioplasty  2009    2005; s/p stent  . Cardiac catheterization  10/2013    [facility]  . Upper gi endoscopy  Sept 2015    Dr Rayann Heman     Home Meds: Prior to Admission medications   Medication Sig Start Date End Date Taking? Authorizing Provider  cyproheptadine (PERIACTIN) 4 MG tablet Take 1 tablet (4 mg total) by mouth 3 (three) times daily as needed for allergies. 05/18/15  Yes [name], MD  diltiazem (CARTIA XT) 240 MG 24 hr capsule Take 1 capsule (240 mg total)  by mouth daily. 05/18/15  Yes [name], MD  esomeprazole (NEXIUM) 40 MG capsule TAKE 1 CAPSULE DAILY AT 12 NOON 03/16/15  Yes [name], MD  fenofibrate micronized (ANTARA) 130 MG capsule TAKE 1 CAPSULE DAILY BEFORE BREAKFAST 12/30/14  Yes [name], MD  finasteride (PROSCAR) 5 MG tablet Take 5 mg by mouth daily.     Yes Historical Provider, MD  isosorbide mononitrate (IMDUR) 30 MG 24 hr tablet TAKE 1 TABLET DAILY 11/15/14  Yes [name], MD  levobunolol (BETAGAN) 0.5 % ophthalmic solution 1 drop 2 (two) times daily.    Yes Historical Provider, MD  lisinopril (PRINIVIL,ZESTRIL) 5 MG tablet TAKE 1 TABLET DAILY 10/10/14  Yes [name], MD  metoprolol (LOPRESSOR) 50 MG tablet TAKE ONE-HALF (1/2) TABLET (25 MG) TWICE A DAY 04/15/14  Yes [name], MD  NITROSTAT 0.4 MG SL tablet DISSOLVE 1 TABLET UNDER THE TONGUE EVERY 5 MINUTES AS NEEDED FOR CHEST PAIN 08/04/14  Yes [name], MD  Omega-3 Fatty Acids (FISH OIL) 1000 MG CAPS Take 1,000 mg by mouth daily.   Yes Historical Provider, MD  pravastatin (PRAVACHOL) 40 MG tablet Take 1 tablet (40 mg total) by mouth at bedtime. 05/18/15  Yes [name], MD  Tamsulosin HCl (FLOMAX) 0.4 MG CAPS Take 0.4 mg by mouth daily.   Yes Historical Provider, MD  XARELTO 20 MG TABS tablet TAKE 1 TABLET DAILY 08/04/14  Yes [name], MD  zaleplon (SONATA) 5 MG capsule Take 1-2 capsules (5-10 mg total) by mouth at bedtime as needed for sleep. 05/18/15  Yes [name], MD    Inpatient Medications:  . diltiazem  240 mg Oral Daily  . ferrous fumarate  1 tablet Oral TID  . finasteride  5 mg Oral Daily  . isosorbide mononitrate  30 mg Oral Daily  . levobunolol  1 drop Both Eyes BID  . lisinopril  5 mg Oral Daily  . metoprolol  25 mg Oral BID  . pantoprazole  40 mg Oral Daily  . pravastatin  40 mg Oral QHS  . rivaroxaban  20 mg Oral Daily  . sodium chloride  3 mL Intravenous Q12H  . tamsulosin  0.4 mg Oral  Daily      Allergies:  Allergies  Allergen Reactions  . Warfarin And Related Other (See Comments)    Chest pain    Social History   Social History  . Marital Status: Married    Spouse Name: N/A  . Number of Children: N/A  . Years of Education: N/A   Occupational History  . Not on file.   Social History Main Topics  . Smoking status: Never Smoker   . Smokeless tobacco: Never Used  . Alcohol Use: No  . Drug Use: No  . Sexual Activity: Not on file  Other Topics Concern  . Not on file   Social History Narrative     Family History  Problem Relation Age of Onset  . Stroke Mother   . Colon cancer Father      Review of Systems: Review of Systems  Constitutional: Positive for malaise/fatigue. Negative for fever, chills, weight loss and diaphoresis.  HENT: Negative for congestion.   Eyes: Negative for discharge and redness.  Respiratory: Negative for cough, hemoptysis, sputum production, shortness of breath and wheezing.   Cardiovascular: Positive for chest pain and palpitations. Negative for orthopnea, claudication, leg swelling and PND.  Gastrointestinal: Negative for heartburn, nausea, vomiting, abdominal pain, blood in stool and melena.  Musculoskeletal: Negative for myalgias, back pain, falls and neck pain.  Skin: Negative for rash.  Neurological: Positive for weakness. Negative for dizziness, tingling, tremors, sensory change, speech change, focal weakness and loss of consciousness.  Endo/Heme/Allergies: Does not bruise/bleed easily.  Psychiatric/Behavioral: Negative for substance abuse. The patient is not nervous/anxious.   All other systems reviewed and are negative.     Labs:  Recent Labs  07/06/15 2000 07/07/15 0025 07/07/15 0521  TROPONINI 0.03 <0.03 <0.03   Lab Results  Component Value Date   WBC 9.0 07/06/2015   HGB 12.9* 07/06/2015   HCT 38.8* 07/06/2015   MCV 96.4 07/06/2015   PLT 195 07/06/2015    Recent Labs Lab 07/06/15 2000  NA  140  K 3.6  CL 106  CO2 26  BUN 24*  CREATININE 1.19  CALCIUM 9.5  PROT 7.2  BILITOT 0.8  ALKPHOS 35*  ALT 13*  AST 21  GLUCOSE 155*   Lab Results  Component Value Date   CHOL 113 05/18/2015   HDL 33.80* 05/18/2015   LDLCALC 67 05/18/2015   TRIG 58.0 05/18/2015   Lab Results  Component Value Date   DDIMER 0.27 07/29/2012    Radiology/Studies:  Dg Chest Port 1 View  07/06/2015  CLINICAL DATA:  Acute onset of chest pain EXAM: PORTABLE CHEST - 1 VIEW COMPARISON:  02/27/2014 FINDINGS: Cardiac shadow is stable. The lungs are clear bilaterally. No focal infiltrate or sizable effusion is seen. No acute bony abnormality is noted. IMPRESSION: No active disease. Electronically Signed   By: Inez Catalina M.D.   On: 07/06/2015 20:23    EKG: sinus tachycardia, 127 bpm, with runs of PACs, inferolateral st depression   Weights: Filed Weights   07/06/15 1948  Weight: 226 lb (102.513 kg)     Physical Exam: Blood pressure 153/90, pulse 80, temperature 97.9 F (36.6 C), temperature source Oral, resp. rate 18, height 6\' 4"  (1.93 m), weight 226 lb (102.513 kg), SpO2 95 %. Body mass index is 27.52 kg/(m^2). General: Well developed, well nourished, in no acute distress. Head: Normocephalic, atraumatic, sclera non-icteric, no xanthomas, nares are without discharge.  Neck: Negative for carotid bruits. JVD not elevated. Lungs: Clear bilaterally to auscultation without wheezes, rales, or rhonchi. Breathing is unlabored. Heart: RRR with S1 S2. No murmurs, rubs, or gallops appreciated. Abdomen: Soft, non-tender, non-distended with normoactive bowel sounds. No hepatomegaly. No rebound/guarding. No obvious abdominal masses. Msk:  Strength and tone appear normal for age. Extremities: No clubbing or cyanosis. No edema.  Distal pedal pulses are 2+ and equal bilaterally. Neuro: Alert and oriented X 3. No facial asymmetry. No focal deficit. Moves all extremities spontaneously. Psych:  Responds to  questions appropriately with a normal affect.    Assessment and Plan:   1. Chest pain with moderate risk for cardiac  etiology/CAD s/p stenting as above: -Schedule Lexiscan Myoview today to evaluate for high risk ischemia  -If normal titrate up Imdur to 60 mg daily to treat possible small vessel disease -Continue aspirin, pravastatin, lisinopril  -Increase Lopressor to 50 mg bid as below  2. PAF: -Maintaining sinus rhythm with frequent PACs -Increase Lopressor to 50 mg bid in an effort to decrease atrial ectopy  -Continue Cardizem CD 240 mg daily -Continue Xarelto 20 mg q supper -CHADSVASc at least 4 (HTN, age x 2, vascular disease)  3. CKD stage II-III: -Stable  -Monitor  4. HTN: -Uncontrolled -Likely 2/2 the above -Lopressor increased as above -Potentially increasing Imdur as above  5. HLD: -Continue statin   Melvern Banker, PA-C Pager: [phone] 07/07/2015, 8:38 AM

## 2015-07-07 NOTE — Progress Notes (Signed)
Spoke with dr. Fletcher Anon to make aware patient currently has no c/o chest pressure. Per patient, " i notice it more when i walk and while eating" current vss. No distress noted. Per dr. Fletcher Anon patient is okay to be discharged home, keep outpatient follow up with cardiologist.

## 2015-07-07 NOTE — Telephone Encounter (Signed)
Thank you! Will continue to follow. 

## 2015-07-07 NOTE — Progress Notes (Signed)
Was made aware after removed iv patient c/o intermediate pressure to chest. Spoke with dr. Posey Pronto who then stated to call dr. Fletcher Anon. Per dr Fletcher Anon md stated to given nitro 0.4mg  sl x1 if pain relieved patient okay to be dishcarged. Will continue to monitor.

## 2015-07-07 NOTE — Discharge Summary (Signed)
[place] [facility] [place] NAME: William Taylor    MR#:  [id number]  DATE OF BIRTH:  [birth year]  DATE OF ADMISSION:  07/06/2015 ADMITTING PHYSICIAN: Lytle Butte, MD  DATE OF DISCHARGE: 07/07/2015  PRIMARY CARE PHYSICIAN: Ronette Deter, MD    ADMISSION DIAGNOSIS:  Chest pain, unspecified chest pain type [R07.9]  DISCHARGE DIAGNOSIS:  Chest pain H/o CAD  SECONDARY DIAGNOSIS:   Past Medical History  Diagnosis Date  . Coronary artery disease   . Hypertension   . Hyperlipidemia   . PAF (paroxysmal atrial fibrillation) ([place])     a. on xarelto  . Cancer [facility])     Prostate, followed by Dr. Jacqlyn Larsen  . MI (myocardial infarction) ([name])     2015  . Skin cancer   . Colon polyp     HOSPITAL COURSE:  81 year old Caucasian gentleman history of paroxysmal atrial fibrillation, coronary artery disease status post PCI stent placement who is presenting with chest pain  1.Chest pain, central: -cont aspirin and statin therapy -NSR on telemetry, negative cardiac enzymes 3 -nitroglycerin when necessary, morphine when necessary -appreciate consult cardiology .pending myoview results Pt asymptomatic  2. Atrial fibrillation rapid ventricular response:  -heart rate now in the 80s  -on Xarelto for anticoagulation   3. GERD without esophagitis PPI therapy  4. BPH unspecified: Proscar  5. Venous thromboembolism prophylactic: Therapeutic Xarelto  Will d/c to home if stress test remains negative   CONSULTS OBTAINED:  Treatment Team:  Lytle Butte, MD [name], MD Wellington Hampshire, MD  DRUG ALLERGIES:   Allergies  Allergen Reactions  . Warfarin And Related Other (See Comments)    Chest pain    DISCHARGE MEDICATIONS:   Current Discharge Medication List    CONTINUE these medications which have NOT CHANGED   Details  cyproheptadine (PERIACTIN) 4 MG tablet Take 1 tablet (4 mg total) by mouth 3 (three) times daily as  needed for allergies. Qty: 90 tablet, Refills: 3    diltiazem (CARTIA XT) 240 MG 24 hr capsule Take 1 capsule (240 mg total) by mouth daily. Qty: 90 capsule, Refills: 2    esomeprazole (NEXIUM) 40 MG capsule TAKE 1 CAPSULE DAILY AT 12 NOON Qty: 90 capsule, Refills: 2    fenofibrate micronized (ANTARA) 130 MG capsule TAKE 1 CAPSULE DAILY BEFORE BREAKFAST Qty: 90 capsule, Refills: 3    finasteride (PROSCAR) 5 MG tablet Take 5 mg by mouth daily.      isosorbide mononitrate (IMDUR) 30 MG 24 hr tablet TAKE 1 TABLET DAILY Qty: 90 tablet, Refills: 3    levobunolol (BETAGAN) 0.5 % ophthalmic solution 1 drop 2 (two) times daily.     lisinopril (PRINIVIL,ZESTRIL) 5 MG tablet TAKE 1 TABLET DAILY Qty: 90 tablet, Refills: 3    metoprolol (LOPRESSOR) 50 MG tablet TAKE ONE-HALF (1/2) TABLET (25 MG) TWICE A DAY Qty: 90 tablet, Refills: 3   Associated Diagnoses: Essential hypertension    NITROSTAT 0.4 MG SL tablet DISSOLVE 1 TABLET UNDER THE TONGUE EVERY 5 MINUTES AS NEEDED FOR CHEST PAIN Qty: 1 tablet, Refills: 5    Omega-3 Fatty Acids (FISH OIL) 1000 MG CAPS Take 1,000 mg by mouth daily.    pravastatin (PRAVACHOL) 40 MG tablet Take 1 tablet (40 mg total) by mouth at bedtime. Qty: 90 tablet, Refills: 2    Tamsulosin HCl (FLOMAX) 0.4 MG CAPS Take 0.4 mg by mouth daily.    XARELTO 20 MG TABS tablet TAKE 1 TABLET  DAILY Qty: 90 tablet, Refills: 3    zaleplon (SONATA) 5 MG capsule Take 1-2 capsules (5-10 mg total) by mouth at bedtime as needed for sleep. Qty: 60 capsule, Refills: 3      STOP taking these medications     ferrous fumarate (HEMOCYTE - 106 MG FE) 325 (106 FE) MG TABS tablet         If you experience worsening of your admission symptoms, develop shortness of breath, life threatening emergency, suicidal or homicidal thoughts you must seek medical attention immediately by calling 911 or calling your MD immediately  if symptoms less severe.  You Must read complete  instructions/literature along with all the possible adverse reactions/side effects for all the Medicines you take and that have been prescribed to you. Take any new Medicines after you have completely understood and accept all the possible adverse reactions/side effects.   Please note  You were cared for by a hospitalist during your hospital stay. If you have any questions about your discharge medications or the care you received while you were in the hospital after you are discharged, you can call the unit and asked to speak with the hospitalist on call if the hospitalist that took care of you is not available. Once you are discharged, your primary care physician will handle any further medical issues. Please note that NO REFILLS for any discharge medications will be authorized once you are discharged, as it is imperative that you return to your primary care physician (or establish a relationship with a primary care physician if you do not have one) for your aftercare needs so that they can reassess your need for medications and monitor your lab values. Today   SUBJECTIVE   Doing well. No more chest pain. Wife in the room   VITAL SIGNS:  Blood pressure 153/90, pulse 80, temperature 97.9 F (36.6 C), temperature source Oral, resp. rate 18, height 6\' 4"  (1.93 m), weight 102.513 kg (226 lb), SpO2 95 %.  I/O:  No intake or output data in the 24 hours ending 07/07/15 1223  PHYSICAL EXAMINATION:  GENERAL:  81 y.o.-year-old patient lying in the bed with no acute distress.  EYES: Pupils equal, round, reactive to light and accommodation. No scleral icterus. Extraocular muscles intact.  HEENT: Head atraumatic, normocephalic. Oropharynx and nasopharynx clear.  NECK:  Supple, no jugular venous distention. No thyroid enlargement, no tenderness.  LUNGS: Normal breath sounds bilaterally, no wheezing, rales,rhonchi or crepitation. No use of accessory muscles of respiration.  CARDIOVASCULAR: S1, S2 normal.  No murmurs, rubs, or gallops.  ABDOMEN: Soft, non-tender, non-distended. Bowel sounds present. No organomegaly or mass.  EXTREMITIES: No pedal edema, cyanosis, or clubbing.  NEUROLOGIC: Cranial nerves II through XII are intact. Muscle strength 5/5 in all extremities. Sensation intact. Gait not checked.  PSYCHIATRIC: The patient is alert and oriented x 3.  SKIN: No obvious rash, lesion, or ulcer.   DATA REVIEW:   CBC   Recent Labs Lab 07/06/15 2000  WBC 9.0  HGB 12.9*  HCT 38.8*  PLT 195    Chemistries   Recent Labs Lab 07/06/15 2000  NA 140  K 3.6  CL 106  CO2 26  GLUCOSE 155*  BUN 24*  CREATININE 1.19  CALCIUM 9.5  AST 21  ALT 13*  ALKPHOS 35*  BILITOT 0.8    Microbiology Results   No results found for this or any previous visit (from the past 240 hour(s)).  RADIOLOGY:  Dg Chest Port 1 [address]  07/06/2015  CLINICAL DATA:  Acute onset of chest pain EXAM: PORTABLE CHEST - 1 VIEW COMPARISON:  02/27/2014 FINDINGS: Cardiac shadow is stable. The lungs are clear bilaterally. No focal infiltrate or sizable effusion is seen. No acute bony abnormality is noted. IMPRESSION: No active disease. Electronically Signed   By: Inez Catalina M.D.   On: 07/06/2015 20:23     Management plans discussed with the patient, family and they are in agreement.  CODE STATUS:     Code Status Orders        Start     Ordered   07/06/15 2225  Full code   Continuous     07/06/15 2225      TOTAL TIME TAKING CARE OF THIS PATIENT: 40 minutes.    Dellamae Rosamilia M.D on 07/07/2015 at 12:23 PM  Between 7am to 6pm - Pager - [phone] After 6pm go to [email or web address] - password [email or web address] [facility]  [facility]  Office  [phone]  CC: Primary care physician; Ronette Deter, MD

## 2015-07-07 NOTE — Telephone Encounter (Signed)
Attempted to contact pt regarding discharge from [facility] on 07/07/15. Left message on pt's vm to call back w/ any questions or concerns regarding discharge instructions and/or medications. Advised pt of appt w/ Ignacia Bayley, NP on 08/15/15 @ 11:00, but pt is on the waiting list in case of a cancellation sooner than that. Asked him to call back if he is unable to keep this appt.

## 2015-07-07 NOTE — Telephone Encounter (Signed)
FYI, HFU pt is being discharged today from the hospital. Pt has an appt on 11/22 @9 :30am. Thank You!

## 2015-07-07 NOTE — Progress Notes (Signed)
Discharge instructions along with home medication list gone over with patient and wife, both verbalized that they understood instructions. Made patient aware rx was electronically submitted to pharmacy. Currently no distress noted. Iv removed and telemetry removed. Patient to be discharged home on ra, wife at bedside [employer]

## 2015-07-07 NOTE — Progress Notes (Signed)
Spoke with ryan dunn pa regarding medications prior to stress test. Per ryan only hold the metoprolol patient is okay to take the remaining scheduled medications

## 2015-07-07 NOTE — Progress Notes (Signed)
Patient was given 0.4mg  sl nitro per patient no pressure at this time. Patient stated the pressure comes and goes, noticed if more with ambulation and during meals. Will call dr. Fletcher Anon to make aware and continue to follow

## 2015-07-07 NOTE — Care Management Obs Status (Signed)
MEDICARE OBSERVATION STATUS NOTIFICATION   Patient Details  Name: William Taylor MRN: [id number] Date of Birth: 26-Oct-1929   Medicare Observation Status Notification Given:  Yes    Katrina Stack, RN 07/07/2015, 8:29 AM

## 2015-07-07 NOTE — Telephone Encounter (Signed)
Unable to reach patient. TCM attempt 1.  Will continue to follow and encourage to see PCP soon. Received message of original appointment scheduled for 07/11/15 at 0930 was rescheduled for 08/07/15.

## 2015-07-07 NOTE — Telephone Encounter (Signed)
Your welcome! Pt wife just called and rescheduled the appt she states they will be going out of town. So she rescheduled the appt to 08/07/2015. I wanted to give you the heads up. Thank You!

## 2015-07-07 NOTE — Telephone Encounter (Signed)
-----   Message from Clarisse Gouge sent at 07/07/2015  2:02 PM EST ----- Regarding: TCM TCM ph [facility] for Chest Pain  S/p NM 07/07/15 Needs 2 week fu  First available w/ Ignacia Bayley 08/15/15 at 11am  Added to Everyone's waitlist

## 2015-07-07 NOTE — Progress Notes (Signed)
Spoke with dr. Posey Pronto regarding stress test. Per md stress test is negative, patient is okay to be discharged home.

## 2015-07-08 ENCOUNTER — Telehealth: Payer: Self-pay | Admitting: Nurse Practitioner

## 2015-07-08 DIAGNOSIS — R05 Cough: Secondary | ICD-10-CM

## 2015-07-08 DIAGNOSIS — R059 Cough, unspecified: Secondary | ICD-10-CM

## 2015-07-08 MED ORDER — AZITHROMYCIN 250 MG PO TABS: ORAL_TABLET | ORAL | Status: DC

## 2015-07-08 MED ORDER — BENZONATATE 100 MG PO CAPS: 100.0000 mg | ORAL_CAPSULE | Freq: Three times a day (TID) | ORAL | Status: DC | PRN

## 2015-07-08 NOTE — Progress Notes (Signed)

## 2015-07-10 ENCOUNTER — Telehealth: Payer: Self-pay

## 2015-07-10 NOTE — Telephone Encounter (Signed)
Unable to reach patient x2.  Appointment previously scheduled.  Will continue to follow as appropriate.

## 2015-07-10 NOTE — Telephone Encounter (Signed)
lmov to call office.  Have sooner appt.

## 2015-07-11 ENCOUNTER — Ambulatory Visit: Payer: Medicare Other | Admitting: Internal Medicine

## 2015-07-12 ENCOUNTER — Encounter: Payer: Medicare Other | Admitting: Cardiovascular Disease

## 2015-07-17 ENCOUNTER — Encounter: Payer: Self-pay | Admitting: Cardiovascular Disease

## 2015-07-17 ENCOUNTER — Ambulatory Visit (INDEPENDENT_AMBULATORY_CARE_PROVIDER_SITE_OTHER): Payer: Medicare Other | Admitting: Cardiovascular Disease

## 2015-07-17 VITALS — BP 145/81 | HR 73 | Ht 76.0 in | Wt 233.8 lb

## 2015-07-17 DIAGNOSIS — I2782 Chronic pulmonary embolism: Secondary | ICD-10-CM | POA: Diagnosis not present

## 2015-07-17 DIAGNOSIS — E785 Hyperlipidemia, unspecified: Secondary | ICD-10-CM

## 2015-07-17 DIAGNOSIS — I2510 Atherosclerotic heart disease of native coronary artery without angina pectoris: Secondary | ICD-10-CM | POA: Diagnosis not present

## 2015-07-17 DIAGNOSIS — I209 Angina pectoris, unspecified: Secondary | ICD-10-CM

## 2015-07-17 DIAGNOSIS — Z7189 Other specified counseling: Secondary | ICD-10-CM | POA: Diagnosis not present

## 2015-07-17 NOTE — Assessment & Plan Note (Signed)
Given his paroxysmal atrial fibrillation, recommended that he stay on anticoagulation

## 2015-07-17 NOTE — Patient Instructions (Signed)
You are doing well. No medication changes were made.  If you have any tachycardia, please take extra 1/2 up to whole metoprolol  Please call us if you have new issues that need to be addressed before your next appt.  Your physician wants you to follow-up in: 6 months.  You will receive a reminder letter in the mail two months in advance. If you don't receive a letter, please call our office to schedule the follow-up appointment.

## 2015-07-17 NOTE — Progress Notes (Signed)
Patient ID: William Taylor, male    DOB: Feb 09, 1930, 81 y.o.   MRN: [id number]  HPI Comments: William Taylor is a very pleasant 81 year old gentleman with a history of coronary artery disease, stent in 2005, atrial fibrillation with RVR in the setting of UTI September 2011, stent placement May 2012 to the OM 2, PE following catheterization started on anticoagulation, atrial fibrillation in 2013, who presents for follow-up of his coronary disease and atrial fibrillation History of anemia  In follow-up, he reports that he recently had episode of chest pain, went to the emergency room. Ruled out for MI, had Myoview showing no ischemia. He was noted to have atrial tachycardia with rates up to the 150 range, metoprolol tartrate increased up to 50 mg twice a day. On the higher dose metoprolol, he has not had any further episodes of tachycardia since discharge. Previously he did note heart rate up to 190 going down to 150, measured with a pulse oximeter. None recently Currently he is happy with his medications. No mention of atrial fibrillation on recent hospital notes.  In general feels well, no further episodes of chest pain. Etiology of his recent chest pain is unclear Denies any significant shortness of breath on activity Reports blood pressure well controlled at home   other past medical history catheterization in 2009 showing patent stent with no other significant disease, normal systolic function in September 2011 who presented to the emergency room September 2011 with malaise, atrial fibrillation with RVR in the setting of a urinary tract infection, recent admission to the hospital November 14 2010 for atrial fibrillation that developed after pushing mowing with some chest pain. He converted back to normal sinus rhythm, Presenting to [name] with chest discomfort with cardiac catheterization Dec 25 2010, showing 90% OM 2 disease, transferred to [name]  with DES stent placed, 2.25 x 16 mm POMUS stent,  with flank pain developing after discharge, admitted to [name] and diagnosed with pulmonary embolism with heavy clot burden.   Found to be in atrial fibrillation in 2013. He had symptoms of shortness of breath with exertion.  After one month on xarelto, amiodarone load and titration downward was started with conversion to NSR in October 2013  EKG the end of October 2013 confirmed normal sinus rhythm.   History of back surgery in 2008, history of prostate cancer followed by William Taylor   He was admitted to the hospital 10/27/2013 with chest pain. He had held his Plavix for colonoscopy 10/26/2013 with polypectomy x3. Cardiac catheterization performed given his severe chest pain and no disease showing severe small vessel disease, patent stents Amiodarone was held for bradycardia, he was started on isosorbide 30 mg daily, Plavix was held, he was continued on aspirin and xarelto.  Cardiac catheterization results; 90% disease at the ostium of a small diagonal vessel, small OM vessel with 80% disease in the proximal region Otherwise stents are patent  Echocardiogram 10/28/2013 shows normal LV function, mildly dilated left atrium, mild aortic regurg, normal right ventricular systolic pressures  Allergies  Allergen Reactions  . Bee Venom Swelling  . Warfarin And Related Other (See Comments)    Chest pain    Outpatient Encounter Prescriptions as of 07/17/2015  Medication Sig  . cyproheptadine (PERIACTIN) 4 MG tablet Take 1 tablet (4 mg total) by mouth 3 (three) times daily as needed for allergies.  Marland Kitchen diltiazem (CARTIA XT) 240 MG 24 hr capsule Take 1 capsule (240 mg total) by mouth daily.  Marland Kitchen esomeprazole ([place])  40 MG capsule TAKE 1 CAPSULE DAILY AT 12 NOON  . fenofibrate micronized (ANTARA) 130 MG capsule TAKE 1 CAPSULE DAILY BEFORE BREAKFAST  . finasteride (PROSCAR) 5 MG tablet Take 5 mg by mouth daily.    . isosorbide mononitrate (IMDUR) 30 MG 24 hr tablet TAKE 1 TABLET DAILY  . levobunolol  (BETAGAN) 0.5 % ophthalmic solution 1 drop 2 (two) times daily.   Marland Kitchen lisinopril (PRINIVIL,ZESTRIL) 5 MG tablet TAKE 1 TABLET DAILY  . metoprolol (LOPRESSOR) 50 MG tablet TAKE ONE TABLET (50 MG) TWICE A DAY  . NITROSTAT 0.4 MG SL tablet DISSOLVE 1 TABLET UNDER THE TONGUE EVERY 5 MINUTES AS NEEDED FOR CHEST PAIN  . Omega-3 Fatty Acids (FISH OIL) 1000 MG CAPS Take 1,000 mg by mouth daily.  . pravastatin (PRAVACHOL) 40 MG tablet Take 1 tablet (40 mg total) by mouth at bedtime.  . Tamsulosin HCl (FLOMAX) 0.4 MG CAPS Take 0.4 mg by mouth daily.  Alveda Reasons 20 MG TABS tablet TAKE 1 TABLET DAILY  . zaleplon (SONATA) 5 MG capsule Take 1-2 capsules (5-10 mg total) by mouth at bedtime as needed for sleep.  . [DISCONTINUED] benzonatate (TESSALON PERLES) 100 MG capsule Take 1 capsule (100 mg total) by mouth 3 (three) times daily as needed for cough.  . [DISCONTINUED] azithromycin (ZITHROMAX Z-PAK) 250 MG tablet As directed   No facility-administered encounter medications on file as of 07/17/2015.    Past Medical History  Diagnosis Date  . Coronary artery disease   . Hypertension   . Hyperlipidemia   . PAF (paroxysmal atrial fibrillation) ([name])     a. on xarelto  . Cancer [name])     Prostate, followed by William Taylor  . MI (myocardial infarction) ([name])     2015  . Skin cancer   . Colon polyp     Past Surgical History  Procedure Laterality Date  . Cataract extraction  Oct. 3, 2012    right eye  . Hernia repair  2013  . Lumbar spine surgery    . Colonoscopy    . Colonoscopy w/ polypectomy  2015    William Taylor  . Coronary angioplasty  2009    2005; s/p stent  . Cardiac catheterization  10/2013    [facility]  . Upper gi endoscopy  Sept 2015    William Taylor    Social History  reports that he has never smoked. He has never used smokeless tobacco. He reports that he does not drink alcohol or use illicit drugs.  Family History family history includes Colon cancer in his father; Stroke in his  mother.     Review of Systems  Constitutional: Negative.   Respiratory: Negative.   Cardiovascular: Negative.   Gastrointestinal: Negative.   Musculoskeletal: Positive for back pain.  Skin: Negative.   Neurological: Negative.   Hematological: Negative.   Psychiatric/Behavioral: Negative.   All other systems reviewed and are negative.  BP 145/81 mmHg  Pulse 73  Ht 6\' 4"  (1.93 m)  Wt 233 lb 12.8 oz (106.051 kg)  BMI 28.47 kg/m2  Physical Exam  Constitutional: He is oriented to person, place, and time. He appears well-developed and well-nourished.  HENT:  Head: Normocephalic.  Nose: Nose normal.  Mouth/Throat: Oropharynx is clear and moist.  Eyes: Conjunctivae are normal. Pupils are equal, round, and reactive to light.  Neck: Normal range of motion. Neck supple. No JVD present.  Cardiovascular: S1 normal, S2 normal, normal heart sounds and intact distal pulses.  An irregularly irregular  rhythm present. Bradycardia present.  Exam reveals no gallop and no friction rub.   No murmur heard. Pulmonary/Chest: Effort normal and breath sounds normal. No respiratory distress. He has no wheezes. He has no rales. He exhibits no tenderness.  Abdominal: Soft. Bowel sounds are normal. He exhibits no distension. There is no tenderness.  Musculoskeletal: Normal range of motion. He exhibits no edema or tenderness.  Lymphadenopathy:    He has no cervical adenopathy.  Neurological: He is alert and oriented to person, place, and time. Coordination normal.  Skin: Skin is warm and dry. No rash noted. No erythema.  Psychiatric: He has a normal mood and affect. His behavior is normal. Judgment and thought content normal.      Assessment and Plan   Nursing note and vitals reviewed.

## 2015-07-17 NOTE — Assessment & Plan Note (Signed)
Recent stress test showing no ischemia Etiology of his chest pain unclear Recommended he call our office for any additional chest pain symptoms

## 2015-07-17 NOTE — Assessment & Plan Note (Signed)
Tolerating anticoagulation with no significant bleeding

## 2015-07-17 NOTE — Assessment & Plan Note (Signed)
Cholesterol is at goal on the current lipid regimen. No changes to the medications were made.  

## 2015-07-17 NOTE — Assessment & Plan Note (Signed)
Recent chest pain symptoms, Myoview showing no ischemia No further testing at this time Currently pain-free

## 2015-07-24 DIAGNOSIS — M5134 Other intervertebral disc degeneration, thoracic region: Secondary | ICD-10-CM | POA: Diagnosis not present

## 2015-07-24 DIAGNOSIS — M9903 Segmental and somatic dysfunction of lumbar region: Secondary | ICD-10-CM | POA: Diagnosis not present

## 2015-07-24 DIAGNOSIS — M5136 Other intervertebral disc degeneration, lumbar region: Secondary | ICD-10-CM | POA: Diagnosis not present

## 2015-07-24 DIAGNOSIS — M9902 Segmental and somatic dysfunction of thoracic region: Secondary | ICD-10-CM | POA: Diagnosis not present

## 2015-07-27 ENCOUNTER — Other Ambulatory Visit: Payer: Self-pay | Admitting: Cardiovascular Disease

## 2015-07-27 DIAGNOSIS — D2272 Melanocytic nevi of left lower limb, including hip: Secondary | ICD-10-CM | POA: Diagnosis not present

## 2015-07-27 DIAGNOSIS — C44212 Basal cell carcinoma of skin of right ear and external auricular canal: Secondary | ICD-10-CM | POA: Diagnosis not present

## 2015-07-27 DIAGNOSIS — Z85828 Personal history of other malignant neoplasm of skin: Secondary | ICD-10-CM | POA: Diagnosis not present

## 2015-07-27 DIAGNOSIS — D225 Melanocytic nevi of trunk: Secondary | ICD-10-CM | POA: Diagnosis not present

## 2015-07-27 DIAGNOSIS — D2261 Melanocytic nevi of right upper limb, including shoulder: Secondary | ICD-10-CM | POA: Diagnosis not present

## 2015-07-27 DIAGNOSIS — D485 Neoplasm of uncertain behavior of skin: Secondary | ICD-10-CM | POA: Diagnosis not present

## 2015-08-07 ENCOUNTER — Encounter: Payer: Self-pay | Admitting: Internal Medicine

## 2015-08-07 ENCOUNTER — Ambulatory Visit (INDEPENDENT_AMBULATORY_CARE_PROVIDER_SITE_OTHER): Payer: Medicare Other | Admitting: Internal Medicine

## 2015-08-07 VITALS — BP 110/70 | HR 62 | Temp 97.7°F | Resp 18 | Ht 76.0 in | Wt 229.0 lb

## 2015-08-07 DIAGNOSIS — I10 Essential (primary) hypertension: Secondary | ICD-10-CM | POA: Diagnosis not present

## 2015-08-07 DIAGNOSIS — I209 Angina pectoris, unspecified: Secondary | ICD-10-CM | POA: Diagnosis not present

## 2015-08-07 DIAGNOSIS — I4891 Unspecified atrial fibrillation: Secondary | ICD-10-CM | POA: Diagnosis not present

## 2015-08-07 MED ORDER — METOPROLOL TARTRATE 50 MG PO TABS
ORAL_TABLET | ORAL | Status: DC
Start: 2015-08-07 — End: 2016-08-05

## 2015-08-07 NOTE — Assessment & Plan Note (Signed)
BP Readings from Last 3 Encounters:  08/07/15 110/70  07/17/15 145/81  07/07/15 101/65   BP well controlled. Recent renal function stable. Continue current medications.

## 2015-08-07 NOTE — Progress Notes (Signed)
Pre-visit discussion using our clinic review tool. No additional management support is needed unless otherwise documented below in the visit note.  

## 2015-08-07 NOTE — Progress Notes (Signed)
Subjective:    Patient ID: William Taylor, male    DOB: 20-Mar-1930, 81 y.o.   MRN: [id number]  HPI  81YO male presents for follow up. Hospitalized for chest pain in November.  Admitted:11/17 Discharged: 11/18 Stress test was normal. Followed up with Cardiology on 11/28.  Initially, when at home, noted heart rate close to 200. Felt pressure in his chest. Went to ER. Had stress test which was normal. Changed Metoprolol dosing in hospital. Continued to have palpitations until recently. Notes HR often in 100s with dyspnea during these episodes.  Noticed that brand of Metoprolol was different. Changed from pink to oblong white tablet. Now back on original brand, with improvement in symptoms. Also continues on Diltiazem. Continues to take Xarelto. No CP, palpitations today. No dyspnea. Aside from this feeling well.    Wt Readings from Last 3 Encounters:  08/07/15 229 lb (103.874 kg)  07/17/15 233 lb 12.8 oz (106.051 kg)  07/06/15 226 lb (102.513 kg)   BP Readings from Last 3 Encounters:  08/07/15 110/70  07/17/15 145/81  07/07/15 101/65    Past Medical History  Diagnosis Date  . Coronary artery disease   . Hypertension   . Hyperlipidemia   . PAF (paroxysmal atrial fibrillation) ([place])     a. on xarelto  . Cancer [facility])     Prostate, followed by Dr. Jacqlyn Larsen  . MI (myocardial infarction) ([place])     2015  . Skin cancer   . Colon polyp    Family History  Problem Relation Age of Onset  . Stroke Mother   . Colon cancer Father    Past Surgical History  Procedure Laterality Date  . Cataract extraction  Oct. 3, 2012    right eye  . Hernia repair  2013  . Lumbar spine surgery    . Colonoscopy    . Colonoscopy w/ polypectomy  2015    Dr Rayann Heman  . Coronary angioplasty  2009    2005; s/p stent  . Cardiac catheterization  10/2013    [facility]  . Upper gi endoscopy  Sept 2015    Dr Rayann Heman   Social History   Social History  . Marital Status: Married    Spouse Name: N/A  .  Number of Children: N/A  . Years of Education: N/A   Social History Main Topics  . Smoking status: Never Smoker   . Smokeless tobacco: Never Used  . Alcohol Use: No  . Drug Use: No  . Sexual Activity: Not Asked   Other Topics Concern  . None   Social History Narrative    Review of Systems  Constitutional: Negative for fever, chills, activity change, appetite change, fatigue and unexpected weight change.  Eyes: Negative for visual disturbance.  Respiratory: Positive for shortness of breath (with episode of palpitations only). Negative for cough.   Cardiovascular: Positive for palpitations. Negative for chest pain and leg swelling.  Gastrointestinal: Negative for abdominal pain and abdominal distention.  Genitourinary: Negative for dysuria, urgency and difficulty urinating.  Musculoskeletal: Negative for arthralgias and gait problem.  Skin: Negative for color change and rash.  Hematological: Negative for adenopathy.  Psychiatric/Behavioral: Negative for sleep disturbance and dysphoric mood. The patient is not nervous/anxious.        Objective:    BP 110/70 mmHg  Pulse 62  Temp(Src) 97.7 F (36.5 C) (Oral)  Resp 18  Ht 6\' 4"  (1.93 m)  Wt 229 lb (103.874 kg)  BMI 27.89 kg/m2  SpO2 96% Physical  Exam  Constitutional: He is oriented to person, place, and time. He appears well-developed and well-nourished. No distress.  HENT:  Head: Normocephalic and atraumatic.  Right Ear: External ear normal.  Left Ear: External ear normal.  Nose: Nose normal.  Mouth/Throat: Oropharynx is clear and moist. No oropharyngeal exudate.  Eyes: Conjunctivae and EOM are normal. Pupils are equal, round, and reactive to light. Right eye exhibits no discharge. Left eye exhibits no discharge. No scleral icterus.  Neck: Normal range of motion. Neck supple. No tracheal deviation present. No thyromegaly present.  Cardiovascular: Normal rate, regular rhythm and normal heart sounds.   Extrasystoles are  present. Exam reveals no gallop and no friction rub.   No murmur heard. Pulmonary/Chest: Effort normal and breath sounds normal. No accessory muscle usage. No tachypnea. No respiratory distress. He has no decreased breath sounds. He has no wheezes. He has no rhonchi. He has no rales. He exhibits no tenderness.  Musculoskeletal: Normal range of motion. He exhibits no edema.  Lymphadenopathy:    He has no cervical adenopathy.  Neurological: He is alert and oriented to person, place, and time. No cranial nerve deficit. Coordination normal.  Skin: Skin is warm and dry. No rash noted. He is not diaphoretic. No erythema. No pallor.  Psychiatric: He has a normal mood and affect. His behavior is normal. Judgment and thought content normal.          Assessment & Plan:  Over 21min of which >50% spent in face-to-face contact with patient discussing plan of care and reviewing recent hospitalization and evaluation with pt.  Problem List Items Addressed This Visit      Unprioritized   Angina pectoris ([place])    Recent chest pain likely related to strain in setting of afib with RVR. HR well controlled now. Will continue to monitor. Continue current medications.      ATRIAL FIBRILLATION - Primary    Reviewed notes from recent hospitalization and cardiology visit. Suspect increased palpitations related to uncontrolled afib with rvr after change in medication either brand or formulation by pharmacy. He will call with brand that is working well for him and we will request for future. Continue Xarelto for anticoagulation. Monitor HR at home. Call if HR consistently >100. Follow up in 3 months and prn.      Hypertension    BP Readings from Last 3 Encounters:  08/07/15 110/70  07/17/15 145/81  07/07/15 101/65   BP well controlled. Recent renal function stable. Continue current medications.          Return in about 3 months (around 11/05/2015) for Recheck.

## 2015-08-07 NOTE — Assessment & Plan Note (Signed)
Reviewed notes from recent hospitalization and cardiology visit. Suspect increased palpitations related to uncontrolled afib with rvr after change in medication either brand or formulation by pharmacy. He will call with brand that is working well for him and we will request for future. Continue Xarelto for anticoagulation. Monitor HR at home. Call if HR consistently >100. Follow up in 3 months and prn.

## 2015-08-07 NOTE — Assessment & Plan Note (Signed)
Recent chest pain likely related to strain in setting of afib with RVR. HR well controlled now. Will continue to monitor. Continue current medications.

## 2015-08-07 NOTE — Patient Instructions (Signed)
Continue current medications    Follow up in 3 months

## 2015-08-08 DIAGNOSIS — L905 Scar conditions and fibrosis of skin: Secondary | ICD-10-CM | POA: Diagnosis not present

## 2015-08-08 DIAGNOSIS — C44212 Basal cell carcinoma of skin of right ear and external auricular canal: Secondary | ICD-10-CM | POA: Diagnosis not present

## 2015-08-15 ENCOUNTER — Encounter: Payer: Medicare Other | Admitting: Nurse Practitioner

## 2015-08-30 ENCOUNTER — Encounter: Payer: Self-pay | Admitting: Internal Medicine

## 2015-08-30 ENCOUNTER — Ambulatory Visit
Admission: RE | Admit: 2015-08-30 | Discharge: 2015-08-30 | Disposition: A | Discharge status: Home / Self Care | Payer: Medicare Other | Source: Ambulatory Visit | Attending: Internal Medicine | Admitting: Internal Medicine

## 2015-08-30 ENCOUNTER — Ambulatory Visit (INDEPENDENT_AMBULATORY_CARE_PROVIDER_SITE_OTHER): Payer: Medicare Other | Admitting: Internal Medicine

## 2015-08-30 ENCOUNTER — Other Ambulatory Visit: Payer: Self-pay | Admitting: Internal Medicine

## 2015-08-30 VITALS — BP 126/65 | HR 76 | Temp 98.0°F | Ht 76.0 in | Wt 228.2 lb

## 2015-08-30 DIAGNOSIS — M25512 Pain in left shoulder: Secondary | ICD-10-CM | POA: Diagnosis present

## 2015-08-30 DIAGNOSIS — G8929 Other chronic pain: Secondary | ICD-10-CM | POA: Insufficient documentation

## 2015-08-30 MED ORDER — HYDROCODONE-ACETAMINOPHEN 5-325 MG PO TABS: 1.0000 | ORAL_TABLET | Freq: Three times a day (TID) | ORAL | Status: DC | PRN

## 2015-08-30 NOTE — Patient Instructions (Signed)
Xray of left shoulder today at [facility].  Continue Aleve as needed.  Add Hydrocodone as needed for severe pain.  We will call you after xray complete.

## 2015-08-30 NOTE — Progress Notes (Signed)
Pre visit review using our clinic review tool, if applicable. No additional management support is needed unless otherwise documented below in the visit note. 

## 2015-08-30 NOTE — Progress Notes (Signed)
Subjective:    Patient ID: William Taylor, male    DOB: 14-Jan-1930, 82 y.o.   MRN: [id number]  HPI  82YO male presents for acute visit.  Left shoulder pain - Left shoulder pain off and on for years. May have injured shoulder years ago when shoveling mulch. Over the last 4-5 weeks, much worse. 1 week, severe pain. Unable to sleep at night. Sore to touch anterior, radiating to left upper arm. Pain with abduction. Some weakness with abduction. Used ice pack alternating with heat. Some improvement. Started Aleve with some improvement. Stopped Aleve 2 days ago.   Wt Readings from Last 3 Encounters:  08/30/15 228 lb 4 oz (103.534 kg)  08/07/15 229 lb (103.874 kg)  07/17/15 233 lb 12.8 oz (106.051 kg)   BP Readings from Last 3 Encounters:  08/30/15 126/65  08/07/15 110/70  07/17/15 145/81    Past Medical History  Diagnosis Date  . Coronary artery disease   . Hypertension   . Hyperlipidemia   . PAF (paroxysmal atrial fibrillation) ([name])     a. on xarelto  . Cancer [facility])     Prostate, followed by Dr. Jacqlyn Larsen  . MI (myocardial infarction) ([place])     2015  . Skin cancer   . Colon polyp    Family History  Problem Relation Age of Onset  . Stroke Mother   . Colon cancer Father    Past Surgical History  Procedure Laterality Date  . Cataract extraction  Oct. 3, 2012    right eye  . Hernia repair  2013  . Lumbar spine surgery    . Colonoscopy    . Colonoscopy w/ polypectomy  2015    Dr Rayann Heman  . Coronary angioplasty  2009    2005; s/p stent  . Cardiac catheterization  10/2013    [facility]  . Upper gi endoscopy  Sept 2015    Dr Rayann Heman   Social History   Social History  . Marital Status: Married    Spouse Name: N/A  . Number of Children: N/A  . Years of Education: N/A   Social History Main Topics  . Smoking status: Never Smoker   . Smokeless tobacco: Never Used  . Alcohol Use: No  . Drug Use: No  . Sexual Activity: Not Asked   Other Topics Concern  . None   Social  History Narrative    Review of Systems  Constitutional: Negative for fever, chills, activity change, appetite change, fatigue and unexpected weight change.  Eyes: Negative for visual disturbance.  Respiratory: Negative for cough and shortness of breath.   Cardiovascular: Negative for chest pain, palpitations and leg swelling.  Gastrointestinal: Negative for abdominal pain and abdominal distention.  Genitourinary: Negative for dysuria, urgency and difficulty urinating.  Musculoskeletal: Positive for myalgias and arthralgias. Negative for gait problem.  Skin: Negative for color change and rash.  Neurological: Positive for weakness. Negative for numbness.  Hematological: Negative for adenopathy.  Psychiatric/Behavioral: Negative for sleep disturbance and dysphoric mood. The patient is not nervous/anxious.        Objective:    BP 126/65 mmHg  Pulse 76  Temp(Src) 98 F (36.7 C) (Oral)  Ht 6\' 4"  (1.93 m)  Wt 228 lb 4 oz (103.534 kg)  BMI 27.80 kg/m2  SpO2 97% Physical Exam  Constitutional: He is oriented to person, place, and time. He appears well-developed and well-nourished. No distress.  HENT:  Head: Normocephalic and atraumatic.  Right Ear: External ear normal.  Left Ear:  External ear normal.  Nose: Nose normal.  Mouth/Throat: Oropharynx is clear and moist. No oropharyngeal exudate.  Eyes: Conjunctivae and EOM are normal. Pupils are equal, round, and reactive to light. Right eye exhibits no discharge. Left eye exhibits no discharge. No scleral icterus.  Neck: Normal range of motion. Neck supple. No tracheal deviation present. No thyromegaly present.  Pulmonary/Chest: Effort normal. No accessory muscle usage. No tachypnea. He has no decreased breath sounds. He has no rhonchi.  Musculoskeletal: He exhibits no edema.       Left shoulder: He exhibits decreased range of motion (limited by pain), tenderness (anterior), pain and decreased strength (abduction). He exhibits no swelling,  no effusion, no crepitus and no spasm.  Lymphadenopathy:    He has no cervical adenopathy.  Neurological: He is alert and oriented to person, place, and time. No cranial nerve deficit. Coordination normal.  Skin: Skin is warm and dry. No rash noted. He is not diaphoretic. No erythema. No pallor.  Psychiatric: He has a normal mood and affect. His behavior is normal. Judgment and thought content normal.          Assessment & Plan:   Problem List Items Addressed This Visit      Unprioritized   Left shoulder pain - Primary    Left shoulder pain. We discussed potential causes. Given old injury, will get xray for evaluation. Discussed that this may be adhesive capsulitis and he might benefit from local steroid injection. Will follow up after xray. Continue prn Aleve. Continue to ice area. Start Hydrocodone prn for severe pain at night. Discussed risks of this medication.      Relevant Medications   HYDROcodone-acetaminophen (NORCO/VICODIN) 5-325 MG tablet   Other Relevant Orders   DG Shoulder Left       Return in about 4 weeks (around 09/27/2015) for Recheck.

## 2015-08-30 NOTE — Assessment & Plan Note (Signed)
Left shoulder pain. We discussed potential causes. Given old injury, will get xray for evaluation. Discussed that this may be adhesive capsulitis and he might benefit from local steroid injection. Will follow up after xray. Continue prn Aleve. Continue to ice area. Start Hydrocodone prn for severe pain at night. Discussed risks of this medication.

## 2015-08-31 ENCOUNTER — Encounter: Payer: Self-pay | Admitting: *Deleted

## 2015-09-11 ENCOUNTER — Encounter: Payer: Self-pay | Admitting: Family Medicine

## 2015-09-11 ENCOUNTER — Ambulatory Visit (INDEPENDENT_AMBULATORY_CARE_PROVIDER_SITE_OTHER): Payer: Medicare Other | Admitting: Family Medicine

## 2015-09-11 VITALS — BP 132/82 | HR 62 | Wt 227.0 lb

## 2015-09-11 DIAGNOSIS — M75102 Unspecified rotator cuff tear or rupture of left shoulder, not specified as traumatic: Secondary | ICD-10-CM | POA: Insufficient documentation

## 2015-09-11 DIAGNOSIS — M12812 Other specific arthropathies, not elsewhere classified, left shoulder: Secondary | ICD-10-CM | POA: Insufficient documentation

## 2015-09-11 DIAGNOSIS — M12512 Traumatic arthropathy, left shoulder: Secondary | ICD-10-CM | POA: Diagnosis not present

## 2015-09-11 NOTE — Assessment & Plan Note (Signed)
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 No imaging done today. X-rays do not show any bony abnormality or any recurrence of bone metastasis from his cancer. Overall I do think the patient has a rotator cuff arthropathy is likely going to worsen over time. We discussed safe alternative medications over-the-counter that could be beneficial. Patient likes to try to avoid prescription medications if possible. We discussed icing regimen. We discussed some mild range of motion exercises but avoiding any heavy lifting with this arm. Discuss that if any worsening symptoms injections could be tried. Differential includes more of a cervical radiculopathy but negative Spurling's today. We discussed postural changes as well as ergonomics her up today that can be helpful. Patient will come back and see me again in 3-4 weeks if not making any significant improvement.

## 2015-09-11 NOTE — Progress Notes (Signed)
[facility] [address] [place], [place] [address] Phone: [phone] Subjective:    I'm seeing this patient by the request  of:  Ronette Deter, MD   CC: left arm pain  [id number] William Taylor is a 82 y.o. male coming in with complaint of left arm pain. Patient does have past medical history significant for prostate cancer. Patient was seen by primary care provider and was complaining of some left shoulder pain with some radicular symptoms going down the arm.patient was sent for x-rays. These were independently visualized and interpreted by me today showing moderatearthritic changes otherwise fairly unremarkable.patient states that the radicular symptoms he was having previously when he saw his primary care provider have resolved. Pain seems to be more localized around the left shoulder. Worse with certain activities. States that he has had it for years but seems to be worsening may be a little bit. States that the frequency of flare seemed to be worse. No weakness. No chronic numbness. No association with any significant type of movement but does use his right arm significantly more. Rates the discomfort at the moment as 3 out of 10.    Past Medical History  Diagnosis Date  . Coronary artery disease   . Hypertension   . Hyperlipidemia   . PAF (paroxysmal atrial fibrillation) ([place])     a. on xarelto  . Cancer [facility])     Prostate, followed by Dr. Jacqlyn Larsen  . MI (myocardial infarction) ([place])     2015  . Skin cancer   . Colon polyp    Past Surgical History  Procedure Laterality Date  . Cataract extraction  Oct. 3, 2012    right eye  . Hernia repair  2013  . Lumbar spine surgery    . Colonoscopy    . Colonoscopy w/ polypectomy  2015    Dr Rayann Heman  . Coronary angioplasty  2009    2005; s/p stent  . Cardiac catheterization  10/2013    [facility]  . Upper gi endoscopy  Sept 2015    Dr Rayann Heman   Social History   Social History  . Marital Status: Married      Spouse Name: N/A  . Number of Children: N/A  . Years of Education: N/A   Social History Main Topics  . Smoking status: Never Smoker   . Smokeless tobacco: Never Used  . Alcohol Use: No  . Drug Use: No  . Sexual Activity: Not on file   Other Topics Concern  . Not on file   Social History Narrative   Allergies  Allergen Reactions  . Bee Venom Swelling  . Warfarin And Related Other (See Comments)    Chest pain   Family History  Problem Relation Age of Onset  . Stroke Mother   . Colon cancer Father     Past medical history, social, surgical and family history all reviewed in electronic medical record.  No pertanent information unless stated regarding to the chief complaint.   Review of Systems: No headache, visual changes, nausea, vomiting, diarrhea, constipation, dizziness, abdominal pain, skin rash, fevers, chills, night sweats, weight loss, swollen lymph nodes, body aches, joint swelling, muscle aches, chest pain, shortness of breath, mood changes.   Objective Blood pressure 132/82, pulse 62, weight 227 lb (102.967 kg).  General: No apparent distress alert and oriented x3 mood and affect normal, dressed appropriately.  HEENT: Pupils equal, extraocular movements intact  Respiratory: Patient's speak in full sentences and does not  appear short of breath  Cardiovascular: No lower extremity edema, non tender, no erythema  Skin: Warm dry intact with no signs of infection or rash on extremities or on axial skeleton.  Abdomen: Soft nontender  Neuro: Cranial nerves II through XII are intact, neurovascularly intact in all extremities with 2+ DTRs and 2+ pulses.  Lymph: No lymphadenopathy of posterior or anterior cervical chain or axillae bilaterally.  Gait normal with good balance and coordination.  MSK:  Non tender with full range of motion and good stability and symmetric strength and tone of elbows, wrist, hip, knee and ankles bilaterally.  Shoulder:left Significant atrophy  of the muscle surrounding the left shoulder. Palpation is normal with no tenderness over AC joint or bicipital groove. Patient active range of motion is 40 flexion to 80, internal rotation to sacrum and external rotation of 5.crepitus noted with range of motion 3/5 strength compared to full strength on the contralateral side Positive impingement Speeds and Yergason's tests normal. No labral pathology noted with negative Obrien's, negative clunk and good stability. Normal scapular function observed. Positive drop arm sign No apprehension sign  Neck: Inspection unremarkable. No palpable stepoffs. Negative Spurling's maneuver. Lacks last 10 of extension as well as side bending 5 bilaterally Grip strength and sensation normal in bilateral hands Strength good C4 to T1 distribution No sensory change to C4 to T1 Negative Hoffman sign bilaterally Reflexes normal     Impression and Recommendations:     This case required medical decision making of moderate complexity.      Note: This dictation was prepared with Dragon dictation along with smaller phrase technology. Any transcriptional errors that result from this process are unintentional.

## 2015-09-11 NOTE — Patient Instructions (Signed)
Good to see you Ice 20 minutes 2 times daily. Usually after activity and before bed. pennsaid pinkie amount topically 2 times daily as needed.  Vitamin D 2000IIU daily [place] 500mg  daily or 3 times a week to help with pain but stop if it causes bruising.  Continue the fish oil  If pain worsens we can do injection in the shoulder can just give me a call.

## 2015-09-27 ENCOUNTER — Ambulatory Visit (INDEPENDENT_AMBULATORY_CARE_PROVIDER_SITE_OTHER): Payer: Medicare Other | Admitting: Internal Medicine

## 2015-09-27 ENCOUNTER — Encounter: Payer: Self-pay | Admitting: Internal Medicine

## 2015-09-27 VITALS — BP 147/74 | HR 69 | Temp 97.5°F | Ht 76.0 in | Wt 228.4 lb

## 2015-09-27 DIAGNOSIS — M25512 Pain in left shoulder: Secondary | ICD-10-CM | POA: Diagnosis not present

## 2015-09-27 NOTE — Progress Notes (Signed)
Pre visit review using our clinic review tool, if applicable. No additional management support is needed unless otherwise documented below in the visit note. 

## 2015-09-27 NOTE — Patient Instructions (Signed)
Continue current medications    Follow up 3 months

## 2015-09-27 NOTE — Progress Notes (Signed)
Subjective:    Patient ID: William Taylor, male    DOB: 1929/10/26, 82 y.o.   MRN: [id number]  HPI  82YO male presents for followup.  Left shoulder pain - Seen by Dr. Tamala Julian. US showed possible rotator cuff tear. Pain has resolved. Never used pain medication.     Wt Readings from Last 3 Encounters:  09/27/15 228 lb 6 oz (103.59 kg)  09/11/15 227 lb (102.967 kg)  08/30/15 228 lb 4 oz (103.534 kg)   BP Readings from Last 3 Encounters:  09/27/15 147/74  09/11/15 132/82  08/30/15 126/65    Past Medical History  Diagnosis Date  . Coronary artery disease   . Hypertension   . Hyperlipidemia   . PAF (paroxysmal atrial fibrillation) ([place])     a. on xarelto  . Cancer [facility])     Prostate, followed by Dr. Jacqlyn Larsen  . MI (myocardial infarction) ([place])     2015  . Skin cancer   . Colon polyp    Family History  Problem Relation Age of Onset  . Stroke Mother   . Colon cancer Father    Past Surgical History  Procedure Laterality Date  . Cataract extraction  Oct. 3, 2012    right eye  . Hernia repair  2013  . Lumbar spine surgery    . Colonoscopy    . Colonoscopy w/ polypectomy  2015    Dr Rayann Heman  . Coronary angioplasty  2009    2005; s/p stent  . Cardiac catheterization  10/2013    [facility]  . Upper gi endoscopy  Sept 2015    Dr Rayann Heman   Social History   Social History  . Marital Status: Married    Spouse Name: N/A  . Number of Children: N/A  . Years of Education: N/A   Social History Main Topics  . Smoking status: Never Smoker   . Smokeless tobacco: Never Used  . Alcohol Use: No  . Drug Use: No  . Sexual Activity: Not Asked   Other Topics Concern  . None   Social History Narrative    Review of Systems  Constitutional: Negative for fever, chills, activity change, appetite change, fatigue and unexpected weight change.  Eyes: Negative for visual disturbance.  Respiratory: Negative for cough and shortness of breath.   Cardiovascular: Negative for chest pain,  palpitations and leg swelling.  Genitourinary: Negative for dysuria, urgency and difficulty urinating.  Musculoskeletal: Negative for myalgias, arthralgias and gait problem.  Skin: Negative for color change and rash.  Hematological: Negative for adenopathy.  Psychiatric/Behavioral: Negative for sleep disturbance and dysphoric mood. The patient is not nervous/anxious.        Objective:    BP 147/74 mmHg  Pulse 69  Temp(Src) 97.5 F (36.4 C) (Oral)  Ht 6\' 4"  (1.93 m)  Wt 228 lb 6 oz (103.59 kg)  BMI 27.81 kg/m2  SpO2 97% Physical Exam  Constitutional: He is oriented to person, place, and time. He appears well-developed and well-nourished. No distress.  HENT:  Head: Normocephalic and atraumatic.  Right Ear: External ear normal.  Left Ear: External ear normal.  Nose: Nose normal.  Mouth/Throat: Oropharynx is clear and moist. No oropharyngeal exudate.  Eyes: Conjunctivae and EOM are normal. Pupils are equal, round, and reactive to light. Right eye exhibits no discharge. Left eye exhibits no discharge. No scleral icterus.  Neck: Normal range of motion. Neck supple. No tracheal deviation present. No thyromegaly present.  Cardiovascular: Normal rate, regular rhythm and normal  heart sounds.  Exam reveals no gallop and no friction rub.   No murmur heard. Pulmonary/Chest: Effort normal and breath sounds normal. No accessory muscle usage. No tachypnea. No respiratory distress. He has no decreased breath sounds. He has no wheezes. He has no rhonchi. He has no rales. He exhibits no tenderness.  Musculoskeletal: Normal range of motion. He exhibits no edema.  Lymphadenopathy:    He has no cervical adenopathy.  Neurological: He is alert and oriented to person, place, and time. No cranial nerve deficit. Coordination normal.  Skin: Skin is warm and dry. No rash noted. He is not diaphoretic. No erythema. No pallor.  Psychiatric: He has a normal mood and affect. His behavior is normal. Judgment and  thought content normal.          Assessment & Plan:   Problem List Items Addressed This Visit      Unprioritized   Left shoulder pain - Primary    Secondary to chronic rotator cuff tear. Symptoms have resolved. Will continue to monitor. Reviewed notes from Dr. Tamala Julian.           Return in about 3 months (around 12/25/2015) for Recheck.

## 2015-09-27 NOTE — Assessment & Plan Note (Signed)
Secondary to chronic rotator cuff tear. Symptoms have resolved. Will continue to monitor. Reviewed notes from Dr. Tamala Julian.

## 2015-10-02 ENCOUNTER — Other Ambulatory Visit: Payer: Self-pay | Admitting: Cardiovascular Disease

## 2015-10-25 ENCOUNTER — Other Ambulatory Visit: Payer: Self-pay | Admitting: Cardiovascular Disease

## 2015-11-06 ENCOUNTER — Ambulatory Visit: Payer: Medicare Other | Admitting: Internal Medicine

## 2015-12-10 ENCOUNTER — Other Ambulatory Visit: Payer: Self-pay | Admitting: Internal Medicine

## 2015-12-11 ENCOUNTER — Other Ambulatory Visit: Payer: Self-pay | Admitting: Cardiovascular Disease

## 2015-12-25 ENCOUNTER — Other Ambulatory Visit: Payer: Self-pay | Admitting: Internal Medicine

## 2015-12-27 ENCOUNTER — Ambulatory Visit (INDEPENDENT_AMBULATORY_CARE_PROVIDER_SITE_OTHER): Payer: Medicare Other | Admitting: Internal Medicine

## 2015-12-27 ENCOUNTER — Encounter: Payer: Self-pay | Admitting: Internal Medicine

## 2015-12-27 VITALS — BP 126/68 | HR 71 | Temp 97.5°F | Ht 76.0 in | Wt 233.2 lb

## 2015-12-27 DIAGNOSIS — G4700 Insomnia, unspecified: Secondary | ICD-10-CM | POA: Diagnosis not present

## 2015-12-27 DIAGNOSIS — E785 Hyperlipidemia, unspecified: Secondary | ICD-10-CM

## 2015-12-27 DIAGNOSIS — I10 Essential (primary) hypertension: Secondary | ICD-10-CM

## 2015-12-27 LAB — COMPREHENSIVE METABOLIC PANEL
ALBUMIN: 4 g/dL (ref 3.5–5.2)
ALT: 13 U/L (ref 0–53)
AST: 18 U/L (ref 0–37)
Alkaline Phosphatase: 30 U/L — ABNORMAL LOW (ref 39–117)
BUN: 22 mg/dL (ref 6–23)
CALCIUM: 9.3 mg/dL (ref 8.4–10.5)
CHLORIDE: 104 meq/L (ref 96–112)
CO2: 26 meq/L (ref 19–32)
CREATININE: 1.28 mg/dL (ref 0.40–1.50)
GFR: 56.9 mL/min — AB (ref >60.00)
Glucose, Bld: 134 mg/dL — ABNORMAL HIGH (ref 70–99)
Potassium: 4 mEq/L (ref 3.5–5.1)
Sodium: 138 mEq/L (ref 135–145)
Total Bilirubin: 0.6 mg/dL (ref 0.2–1.2)
Total Protein: 6.8 g/dL (ref 6.0–8.3)

## 2015-12-27 NOTE — Assessment & Plan Note (Signed)
Persistent insomnia. Discussed some options for treatment. Reviewed good sleep hygiene. Will stop Sonata as no improvement with this. He prefers to hold off on new medications. Follow up prn.

## 2015-12-27 NOTE — Progress Notes (Signed)
Subjective:    Patient ID: William Taylor, male    DOB: 08-17-1930, 82 y.o.   MRN: [id number]  HPI  82YO male presents for follow up.  Insomnia - Tried using Sonata with no improvement. Some nights better than others. Tried Zyquil with no improvement. Active during day. Falls asleep but then wakes during night.  Aside from this feeling well. Compliant with medication.  Wt Readings from Last 3 Encounters:  12/27/15 233 lb 4 oz (105.802 kg)  09/27/15 228 lb 6 oz (103.59 kg)  09/11/15 227 lb (102.967 kg)   BP Readings from Last 3 Encounters:  12/27/15 126/68  09/27/15 147/74  09/11/15 132/82    Past Medical History  Diagnosis Date  . Coronary artery disease   . Hypertension   . Hyperlipidemia   . PAF (paroxysmal atrial fibrillation) ([place])     a. on xarelto  . Cancer [facility])     Prostate, followed by Dr. Jacqlyn Larsen  . MI (myocardial infarction) ([place])     2015  . Skin cancer   . Colon polyp    Family History  Problem Relation Age of Onset  . Stroke Mother   . Colon cancer Father    Past Surgical History  Procedure Laterality Date  . Cataract extraction  Oct. 3, 2012    right eye  . Hernia repair  2013  . Lumbar spine surgery    . Colonoscopy    . Colonoscopy w/ polypectomy  2015    Dr Rayann Heman  . Coronary angioplasty  2009    2005; s/p stent  . Cardiac catheterization  10/2013    [facility]  . Upper gi endoscopy  Sept 2015    Dr Rayann Heman   Social History   Social History  . Marital Status: Married    Spouse Name: N/A  . Number of Children: N/A  . Years of Education: N/A   Social History Main Topics  . Smoking status: Never Smoker   . Smokeless tobacco: Never Used  . Alcohol Use: No  . Drug Use: No  . Sexual Activity: Not Asked   Other Topics Concern  . None   Social History Narrative    Review of Systems  Constitutional: Negative for fever, chills, activity change, appetite change, fatigue and unexpected weight change.  Eyes: Negative for visual disturbance.    Respiratory: Negative for cough and shortness of breath.   Cardiovascular: Negative for chest pain, palpitations and leg swelling.  Gastrointestinal: Negative for nausea, vomiting, abdominal pain, diarrhea, constipation and abdominal distention.  Genitourinary: Negative for dysuria, urgency and difficulty urinating.  Musculoskeletal: Negative for arthralgias and gait problem.  Skin: Negative for color change and rash.  Hematological: Negative for adenopathy.  Psychiatric/Behavioral: Positive for sleep disturbance. Negative for dysphoric mood. The patient is not nervous/anxious.        Objective:    BP 126/68 mmHg  Pulse 71  Temp(Src) 97.5 F (36.4 C) (Oral)  Ht 6\' 4"  (1.93 m)  Wt 233 lb 4 oz (105.802 kg)  BMI 28.40 kg/m2  SpO2 94% Physical Exam  Constitutional: He is oriented to person, place, and time. He appears well-developed and well-nourished. No distress.  HENT:  Head: Normocephalic and atraumatic.  Right Ear: External ear normal.  Left Ear: External ear normal.  Nose: Nose normal.  Mouth/Throat: Oropharynx is clear and moist. No oropharyngeal exudate.  Eyes: Conjunctivae and EOM are normal. Pupils are equal, round, and reactive to light. Right eye exhibits no discharge. Left eye exhibits  no discharge. No scleral icterus.  Neck: Normal range of motion. Neck supple. No tracheal deviation present. No thyromegaly present.  Cardiovascular: Normal rate, regular rhythm and normal heart sounds.  Exam reveals no gallop and no friction rub.   No murmur heard. Pulmonary/Chest: Effort normal and breath sounds normal. No accessory muscle usage. No tachypnea. No respiratory distress. He has no decreased breath sounds. He has no wheezes. He has no rhonchi. He has no rales. He exhibits no tenderness.  Musculoskeletal: Normal range of motion. He exhibits no edema.  Lymphadenopathy:    He has no cervical adenopathy.  Neurological: He is alert and oriented to person, place, and time. No  cranial nerve deficit. Coordination normal.  Skin: Skin is warm and dry. No rash noted. He is not diaphoretic. No erythema. No pallor.  Psychiatric: He has a normal mood and affect. His behavior is normal. Judgment and thought content normal.          Assessment & Plan:   Problem List Items Addressed This Visit      Unprioritized   Hyperlipidemia    Will check LFTs with labs. Continue Pravastatin.      Relevant Orders   Comprehensive metabolic panel   Hypertension    BP Readings from Last 3 Encounters:  12/27/15 126/68  09/27/15 147/74  09/11/15 132/82   BP well controlled. Renal function with labs. Continue current medications.      Insomnia - Primary    Persistent insomnia. Discussed some options for treatment. Reviewed good sleep hygiene. Will stop Sonata as no improvement with this. He prefers to hold off on new medications. Follow up prn.          Return in about 6 months (around 06/28/2016) for Recheck.  Ronette Deter, MD Internal Medicine [facility]

## 2015-12-27 NOTE — Patient Instructions (Signed)
Labs today.  Follow up in 6 months. 

## 2015-12-27 NOTE — Assessment & Plan Note (Signed)
Will check LFTs with labs. Continue Pravastatin. 

## 2015-12-27 NOTE — Assessment & Plan Note (Signed)
BP Readings from Last 3 Encounters:  12/27/15 126/68  09/27/15 147/74  09/11/15 132/82   BP well controlled. Renal function with labs. Continue current medications.

## 2016-01-14 ENCOUNTER — Emergency Department: Payer: Medicare Other

## 2016-01-14 ENCOUNTER — Emergency Department
Admission: EM | Admit: 2016-01-14 | Discharge: 2016-01-15 | Disposition: A | Discharge status: Home / Self Care | Payer: Medicare Other | Attending: Emergency Medicine | Admitting: Emergency Medicine

## 2016-01-14 ENCOUNTER — Encounter: Payer: Self-pay | Admitting: Emergency Medicine

## 2016-01-14 DIAGNOSIS — I252 Old myocardial infarction: Secondary | ICD-10-CM | POA: Diagnosis not present

## 2016-01-14 DIAGNOSIS — Z8546 Personal history of malignant neoplasm of prostate: Secondary | ICD-10-CM | POA: Insufficient documentation

## 2016-01-14 DIAGNOSIS — E785 Hyperlipidemia, unspecified: Secondary | ICD-10-CM | POA: Diagnosis not present

## 2016-01-14 DIAGNOSIS — M12812 Other specific arthropathies, not elsewhere classified, left shoulder: Secondary | ICD-10-CM

## 2016-01-14 DIAGNOSIS — I10 Essential (primary) hypertension: Secondary | ICD-10-CM | POA: Insufficient documentation

## 2016-01-14 DIAGNOSIS — I2510 Atherosclerotic heart disease of native coronary artery without angina pectoris: Secondary | ICD-10-CM | POA: Insufficient documentation

## 2016-01-14 DIAGNOSIS — M75102 Unspecified rotator cuff tear or rupture of left shoulder, not specified as traumatic: Secondary | ICD-10-CM | POA: Insufficient documentation

## 2016-01-14 DIAGNOSIS — Z85828 Personal history of other malignant neoplasm of skin: Secondary | ICD-10-CM | POA: Insufficient documentation

## 2016-01-14 DIAGNOSIS — M25512 Pain in left shoulder: Secondary | ICD-10-CM

## 2016-01-14 LAB — BASIC METABOLIC PANEL
ANION GAP: 7 (ref 5–15)
BUN: 21 mg/dL — ABNORMAL HIGH (ref 6–20)
CALCIUM: 9.4 mg/dL (ref 8.9–10.3)
CHLORIDE: 107 mmol/L (ref 101–111)
CO2: 25 mmol/L (ref 22–32)
Creatinine, Ser: 1.35 mg/dL — ABNORMAL HIGH (ref 0.61–1.24)
GFR calc non Af Amer: 47 mL/min — ABNORMAL LOW (ref >60)
GFR, EST AFRICAN AMERICAN: 54 mL/min — AB (ref >60)
GLUCOSE: 144 mg/dL — AB (ref 65–99)
POTASSIUM: 3.9 mmol/L (ref 3.5–5.1)
Sodium: 139 mmol/L (ref 135–145)

## 2016-01-14 LAB — CBC
HEMATOCRIT: 33 % — AB (ref 40.0–52.0)
HEMOGLOBIN: 11.9 g/dL — AB (ref 13.0–18.0)
MCH: 39.7 pg — AB (ref 26.0–34.0)
MCHC: 36 g/dL (ref 32.0–36.0)
MCV: 110.4 fL — ABNORMAL HIGH (ref 80.0–100.0)
Platelets: 212 10*3/uL (ref 150–440)
RBC: 2.99 MIL/uL — AB (ref 4.40–5.90)
RDW: 14 % (ref 11.5–14.5)
WBC: 10.4 10*3/uL (ref 3.8–10.6)

## 2016-01-14 LAB — TROPONIN I

## 2016-01-14 LAB — PROTIME-INR
INR: 2.43
Prothrombin Time: 26.1 seconds — ABNORMAL HIGH (ref 11.4–15.0)

## 2016-01-14 MED ORDER — MORPHINE SULFATE (PF) 4 MG/ML IV SOLN
4.0000 mg | Freq: Once | INTRAVENOUS | Status: AC
  Administered 2016-01-14: 4 mg via INTRAVENOUS
  Filled 2016-01-14: qty 1

## 2016-01-14 MED ORDER — ONDANSETRON HCL 4 MG/2ML IJ SOLN
4.0000 mg | Freq: Once | INTRAMUSCULAR | Status: AC
Start: 2016-01-14 — End: 2016-01-14
  Administered 2016-01-14: 4 mg via INTRAVENOUS
  Filled 2016-01-14: qty 2

## 2016-01-14 MED ORDER — HYDROMORPHONE HCL 1 MG/ML IJ SOLN
0.5000 mg | Freq: Once | INTRAMUSCULAR | Status: AC
  Administered 2016-01-14: 0.5 mg via INTRAVENOUS
  Filled 2016-01-14: qty 1

## 2016-01-14 NOTE — ED Provider Notes (Addendum)
[facility] Emergency Department Provider Note  ____________________________________________   I have reviewed the triage vital signs and the nursing notes.   HISTORY  Chief Complaint Chest Pain    HPI William Taylor is a 82 y.o. male presents today complaining of left shoulder pain. Patient history of left shoulder pain. Had x-rays done about 4 months ago for this. He has had flares off and on for years according to notes. He was seen by orthopedic surgery for this. Patient has Xarelto and a history of A. fib as well as cardiac history. This does not feel like his heart attack. He states he was standing or minding his own business and started having pain to his left shoulder which was significant. Did not radiate. He states that he has no numbness or weakness. The pain is severe he states. He cannot think of any inciting events. In the past, there have been documented repetitive motions of breath as somebody can think of anything that he might have done to make his shoulder hurt. He has no chest pain he has no shortness of breath. He denies any radiation. He denies any cough or shortness of breath. He has no leg pain or swelling. Denies fever.    Past Medical History  Diagnosis Date  . Coronary artery disease   . Hypertension   . Hyperlipidemia   . PAF (paroxysmal atrial fibrillation) ([place])     a. on xarelto  . Cancer [facility])     Prostate, followed by Dr. Jacqlyn Larsen  . MI (myocardial infarction) ([place])     2015  . Skin cancer   . Colon polyp     Patient Active Problem List   Diagnosis Date Noted  . Left rotator cuff tear arthropathy 09/11/2015  . Left shoulder pain 08/30/2015  . Angina pectoris ([place]) 07/17/2015  . External hemorrhoid 06/01/2015  . Internal hemorrhoid 06/01/2015  . Coronary artery disease 05/18/2015  . Absolute anemia 03/01/2014  . Hypomagnesemia 03/01/2014  . External hemorrhoids 09/23/2013  . Medicare annual wellness visit, subsequent  03/15/2013  . Prostate cancer ([place]) 03/15/2013  . Abnormal CT scan, chest 11/23/2012  . Insomnia 09/03/2012  . Hypertension 07/23/2012  . Elevated blood sugar 07/23/2012  . Chronic kidney disease (CKD), stage III (moderate) 07/23/2011  . Pulmonary embolism ([place]) 01/09/2011  . ATRIAL FIBRILLATION 09/06/2010  . Hyperlipidemia 05/19/2009  . CAD, NATIVE VESSEL 05/19/2009    Past Surgical History  Procedure Laterality Date  . Cataract extraction  Oct. 3, 2012    right eye  . Hernia repair  2013  . Lumbar spine surgery    . Colonoscopy    . Colonoscopy w/ polypectomy  2015    Dr Rayann Heman  . Coronary angioplasty  2009    2005; s/p stent  . Cardiac catheterization  10/2013    [facility]  . Upper gi endoscopy  Sept 2015    Dr Rayann Heman    Current Outpatient Rx  Name  Route  Sig  Dispense  Refill  . cyproheptadine (PERIACTIN) 4 MG tablet   Oral   Take 1 tablet (4 mg total) by mouth 3 (three) times daily as needed for allergies.   90 tablet   3   . diltiazem (CARTIA XT) 240 MG 24 hr capsule   Oral   Take 1 capsule (240 mg total) by mouth daily.   90 capsule   2   . esomeprazole (NEXIUM) 40 MG capsule      TAKE 1 CAPSULE DAILY  AT 12 NOON   90 capsule   1   . fenofibrate micronized (ANTARA) 130 MG capsule      TAKE 1 CAPSULE DAILY BEFORE BREAKFAST   90 capsule   2   . finasteride (PROSCAR) 5 MG tablet   Oral   Take 5 mg by mouth daily.           Marland Kitchen HYDROcodone-acetaminophen (NORCO/VICODIN) 5-325 MG tablet   Oral   Take 1-2 tablets by mouth 3 (three) times daily as needed for moderate pain.   30 tablet   0   . isosorbide mononitrate (IMDUR) 30 MG 24 hr tablet      TAKE 1 TABLET DAILY   90 tablet   3   . levobunolol (BETAGAN) 0.5 % ophthalmic solution      1 drop 2 (two) times daily.          Marland Kitchen lisinopril (PRINIVIL,ZESTRIL) 5 MG tablet      TAKE 1 TABLET DAILY   90 tablet   3   . metoprolol (LOPRESSOR) 50 MG tablet      TAKE ONE TABLET (50 MG) TWICE A DAY.    180 tablet   3     Dispense as written.    Must beAurobindo pill markinging is c 74 color is  ...   . nitroGLYCERIN (NITROSTAT) 0.4 MG SL tablet      DISSOLVE 1 TABLET UNDER THE TONGUE EVERY 5 MINUTES AS NEEDED FOR CHEST PAIN   25 tablet   1   . Omega-3 Fatty Acids (FISH OIL) 1000 MG CAPS   Oral   Take 1,000 mg by mouth daily.         . pravastatin (PRAVACHOL) 40 MG tablet   Oral   Take 1 tablet (40 mg total) by mouth at bedtime.   90 tablet   2   . Probiotic Product (PROBIOTIC PO)   Oral   Take by mouth.         . Tamsulosin HCl (FLOMAX) 0.4 MG CAPS   Oral   Take 0.4 mg by mouth daily.         Alveda Reasons 20 MG TABS tablet      TAKE 1 TABLET DAILY   90 tablet   3     Allergies Bee venom and Warfarin and related  Family History  Problem Relation Age of Onset  . Stroke Mother   . Colon cancer Father     Social History Social History  Substance Use Topics  . Smoking status: Never Smoker   . Smokeless tobacco: Never Used  . Alcohol Use: No    Review of Systems Constitutional: No fever/chills Eyes: No visual changes. ENT: No sore throat. No stiff neck no neck pain Cardiovascular: Denies chest pain. Respiratory: Denies shortness of breath. Gastrointestinal:   no vomiting.  No diarrhea.  No constipation. Genitourinary: Negative for dysuria. Musculoskeletal: Negative lower extremity swelling Skin: Negative for rash. Neurological: Negative for headaches, focal weakness or numbness. 10-point ROS otherwise negative.  ____________________________________________   PHYSICAL EXAM:  VITAL SIGNS: ED Triage Vitals  Enc Vitals Group     BP 01/14/16 2202 158/85 mmHg     Pulse Rate 01/14/16 2202 70     Resp 01/14/16 2202 16     Temp 01/14/16 2202 97.9 F (36.6 C)     Temp Source 01/14/16 2202 Oral     SpO2 01/14/16 2202 99 %     Weight 01/14/16 2202 220 lb (99.791 kg)  Height 01/14/16 2202 6\' 4"  (1.93 m)     Head Cir --      Peak Flow --       Pain Score 01/14/16 2155 9     Pain Loc --      Pain Edu? --      Excl. in [place]? --     Constitutional: Alert and oriented. Well appearing Anxious and upset as are his family Eyes: Conjunctivae are normal. PERRL. EOMI. Head: Atraumatic. Nose: No congestion/rhinnorhea. Mouth/Throat: Mucous membranes are moist.  Oropharynx non-erythematous. Neck: No stridor.   Nontender with no meningismus Cardiovascular: Normal rate, regular rhythm. Grossly normal heart sounds.  Good peripheral circulation. Respiratory: Normal respiratory effort.  No retractions. Lungs CTAB. Abdominal: Soft and nontender. No distention. No guarding no rebound Back:  There is no focal tenderness or step off there is no midline tenderness there are no lesions noted. there is no CVA tenderness Musculoskeletal: Patient has tenderness to palpation left shoulder strong distal pulses, pain with ranging of the shoulder, there is no obvious deformity, shoulder is not red or hot to touch., when I touch the area patient jumps. There is no swelling noted. He has pain with ranging of the shoulder. No joint effusions, no DVT signs strong distal pulses no edema Neurologic:  Normal speech and language. No gross focal neurologic deficits are appreciated.  Skin:  Skin is warm, dry and intact. No rash noted. Psychiatric: Mood and affect are anxious. Speech and behavior are normal.  ____________________________________________   LABS (all labs ordered are listed, but only abnormal results are displayed)  Labs Reviewed  BASIC METABOLIC PANEL - Abnormal; Notable for the following:    Glucose, Bld 144 (*)    BUN 21 (*)    Creatinine, Ser 1.35 (*)    GFR calc non Af Amer 47 (*)    GFR calc Af Amer 54 (*)    All other components within normal limits  CBC - Abnormal; Notable for the following:    RBC 2.99 (*)    Hemoglobin 11.9 (*)    HCT 33.0 (*)    MCV 110.4 (*)    MCH 39.7 (*)    All other components within normal limits  TROPONIN I    ____________________________________________  EKG  I personally interpreted any EKGs ordered by me or triage 2 different EKGs even obtained on this patient, the first shows sinus rhythm rate 70 bpm with no acute ST elevation or depression PVCs noted, nonspecific ST changes wandering baseline EKG #2 sinus bradycardic rate 53 bpm no acute ST elevation or acute ST depression or ST changes noted acute ischemia ____________________________________________  RADIOLOGY  I reviewed any imaging ordered by me or triage that were performed during my shift and, if possible, patient and/or family made aware of any abnormal findings. ____________________________________________   PROCEDURES  Procedure(s) performed: None  Critical Care performed: None  ____________________________________________   INITIAL IMPRESSION / ASSESSMENT AND PLAN / ED COURSE  Pertinent labs & imaging results that were available during my care of the patient were reviewed by me and considered in my medical decision making (see chart for details).  Patient with very reproducible shoulder pain, which is chronically present. The family and patient are very concerned about this. We have no indication at this time that this represents dissection and acute coronary syndrome, patient is having this pain for over 4 hours and his troponin is negative EKGs twice are reassuring vital signs are reassuring. Patient is anxious and upset  about this shoulder pain that he is having. There is no chest pain there's nothing that seems to go to his back. There is no evidence of dissection, he has strong pulses. The patient got morphine but states "it did not touch" his pain. Concerning degree of discomfort but very reproducible pain with no obvious botflies for other pathology. We will continue to try to manage his discomfort x-rays are pending to my read there is no acute issues but we will await radiology  read.  ----------------------------------------- 11:09 PM on 01/14/2016 -----------------------------------------  Signed out to dr sung at the end of my shift. ____________________________________________   FINAL CLINICAL IMPRESSION(S) / ED DIAGNOSES  Final diagnoses:  None      This chart was dictated using voice recognition software.  Despite best efforts to proofread,  errors can occur which can change meaning.     Schuyler Amor, MD 01/14/16 [id number]  Schuyler Amor, MD 01/14/16 [phone]

## 2016-01-14 NOTE — ED Notes (Signed)
Ena Dawley, rn taking pt to room one at this time. Will obtain ekg on arrival to room.

## 2016-01-14 NOTE — ED Notes (Signed)
Pt ambulatory to triage with wife, c/o left arm pain denies SOB, dizziness, HA, N/V.  Pain started approx 1830, 9/10 pain deep ache.  Pt hx of 2 x cardiac stent, takes Xelralto for AFIB, pt denies ASA or nitro at home treatment.

## 2016-01-15 LAB — TROPONIN I

## 2016-01-15 MED ORDER — METHYLPREDNISOLONE 4 MG PO TBPK: ORAL_TABLET | ORAL | Status: DC

## 2016-01-15 MED ORDER — KETOROLAC TROMETHAMINE 30 MG/ML IJ SOLN
10.0000 mg | Freq: Once | INTRAMUSCULAR | Status: AC
  Administered 2016-01-15: 9.9 mg via INTRAVENOUS
  Filled 2016-01-15: qty 1

## 2016-01-15 MED ORDER — NAPROXEN 250 MG PO TABS: ORAL_TABLET | ORAL | Status: DC

## 2016-01-15 MED ORDER — OXYCODONE-ACETAMINOPHEN 5-325 MG PO TABS
1.0000 | ORAL_TABLET | Freq: Once | ORAL | Status: AC
  Administered 2016-01-15: 1 via ORAL
  Filled 2016-01-15: qty 1

## 2016-01-15 MED ORDER — OXYCODONE-ACETAMINOPHEN 5-325 MG PO TABS: 1.0000 | ORAL_TABLET | ORAL | Status: DC | PRN

## 2016-01-15 NOTE — ED Provider Notes (Signed)
-----------------------------------------   11:37 PM on 01/14/2016 -----------------------------------------  Assumed care of patient. Timed repeat troponin pending. Will continue to monitor.  ----------------------------------------- 2:17 AM on 01/15/2016 -----------------------------------------  Updated patient and spouse of repeat negative troponin. Patient has several documented visits with his PCP with left rotator cuff arthropathy; states he has never seen an orthopedist. Suspect also element of inflammation/bursitis as patient has pain on ranging his shoulder. Plan for limited NSAIDs, analgesia, sling and orthopedic follow-up. Feel patient may also benefit from low-dose steroid taper. Low suspicion for ACS, PE, dissection. Strict return precautions given. Patient and spouse verbalize understanding and agree with plan of care.  Paulette Blanch, MD 01/15/16 [phone]

## 2016-01-15 NOTE — Discharge Instructions (Signed)
1. Wear sling as needed for comfort. 2. You may take pain medicines as needed (Naprosyn/Percocet). 3. Take steroid taper as directed (Medrol Dosepak). 4. Return to the ER for worsening symptoms, persistent vomiting, difficulty breathing or other concerns.  Shoulder Pain The shoulder is the joint that connects your arms to your body. The bones that form the shoulder joint include the upper arm bone (humerus), the shoulder blade (scapula), and the collarbone (clavicle). The top of the humerus is shaped like a ball and fits into a rather flat socket on the scapula (glenoid cavity). A combination of muscles and strong, fibrous tissues that connect muscles to bones (tendons) support your shoulder joint and hold the ball in the socket. Small, fluid-filled sacs (bursae) are located in different areas of the joint. They act as cushions between the bones and the overlying soft tissues and help reduce friction between the gliding tendons and the bone as you move your arm. Your shoulder joint allows a wide range of motion in your arm. This range of motion allows you to do things like scratch your back or throw a ball. However, this range of motion also makes your shoulder more prone to pain from overuse and injury. Causes of shoulder pain can originate from both injury and overuse and usually can be grouped in the following four categories:  Redness, swelling, and pain (inflammation) of the tendon (tendinitis) or the bursae (bursitis).  Instability, such as a dislocation of the joint.  Inflammation of the joint (arthritis).  Broken bone (fracture). HOME CARE INSTRUCTIONS   Apply ice to the sore area.  Put ice in a plastic bag.  Place a towel between your skin and the bag.  Leave the ice on for 15-20 minutes, 3-4 times per day for the first 2 days, or as directed by your health care provider.  Stop using cold packs if they do not help with the pain.  If you have a shoulder sling or immobilizer, wear  it as long as your caregiver instructs. Only remove it to shower or bathe. Move your arm as little as possible, but keep your hand moving to prevent swelling.  Squeeze a soft ball or foam pad as much as possible to help prevent swelling.  Only take over-the-counter or prescription medicines for pain, discomfort, or fever as directed by your caregiver. SEEK MEDICAL CARE IF:   Your shoulder pain increases, or new pain develops in your arm, hand, or fingers.  Your hand or fingers become cold and numb.  Your pain is not relieved with medicines. SEEK IMMEDIATE MEDICAL CARE IF:   Your arm, hand, or fingers are numb or tingling.  Your arm, hand, or fingers are significantly swollen or turn white or blue. MAKE SURE YOU:   Understand these instructions.  Will watch your condition.  Will get help right away if you are not doing well or get worse.   This information is not intended to replace advice given to you by your health care provider. Make sure you discuss any questions you have with your health care provider.   Document Released: 05/15/2005 Document Revised: 08/26/2014 Document Reviewed: 11/28/2014 Elsevier Interactive Patient Education 2016 [employer]  Musculoskeletal Pain Musculoskeletal pain is muscle and boney aches and pains. These pains can occur in any part of the body. Your caregiver may treat you without knowing the cause of the pain. They may treat you if blood or urine tests, X-rays, and other tests were normal.  CAUSES There is often not  a definite cause or reason for these pains. These pains may be caused by a type of germ (virus). The discomfort may also come from overuse. Overuse includes working out too hard when your body is not fit. Boney aches also come from weather changes. Bone is sensitive to atmospheric pressure changes. HOME CARE INSTRUCTIONS   Ask when your test results will be ready. Make sure you get your test results.  Only take over-the-counter or  prescription medicines for pain, discomfort, or fever as directed by your caregiver. If you were given medications for your condition, do not drive, operate machinery or power tools, or sign legal documents for 24 hours. Do not drink alcohol. Do not take sleeping pills or other medications that may interfere with treatment.  Continue all activities unless the activities cause more pain. When the pain lessens, slowly resume normal activities. Gradually increase the intensity and duration of the activities or exercise.  During periods of severe pain, bed rest may be helpful. Lay or sit in any position that is comfortable.  Putting ice on the injured area.  Put ice in a bag.  Place a towel between your skin and the bag.  Leave the ice on for 15 to 20 minutes, 3 to 4 times a day.  Follow up with your caregiver for continued problems and no reason can be found for the pain. If the pain becomes worse or does not go away, it may be necessary to repeat tests or do additional testing. Your caregiver may need to look further for a possible cause. SEEK IMMEDIATE MEDICAL CARE IF:  You have pain that is getting worse and is not relieved by medications.  You develop chest pain that is associated with shortness or breath, sweating, feeling sick to your stomach (nauseous), or throw up (vomit).  Your pain becomes localized to the abdomen.  You develop any new symptoms that seem different or that concern you. MAKE SURE YOU:   Understand these instructions.  Will watch your condition.  Will get help right away if you are not doing well or get worse.   This information is not intended to replace advice given to you by your health care provider. Make sure you discuss any questions you have with your health care provider.   Document Released: 08/05/2005 Document Revised: 10/28/2011 Document Reviewed: 04/09/2013 Elsevier Interactive Patient Education [employer].

## 2016-01-16 ENCOUNTER — Ambulatory Visit (INDEPENDENT_AMBULATORY_CARE_PROVIDER_SITE_OTHER): Payer: Medicare Other | Admitting: Cardiovascular Disease

## 2016-01-16 ENCOUNTER — Encounter: Payer: Self-pay | Admitting: Cardiovascular Disease

## 2016-01-16 VITALS — BP 132/62 | HR 65 | Ht 76.0 in | Wt 231.8 lb

## 2016-01-16 DIAGNOSIS — I10 Essential (primary) hypertension: Secondary | ICD-10-CM | POA: Diagnosis not present

## 2016-01-16 DIAGNOSIS — I2510 Atherosclerotic heart disease of native coronary artery without angina pectoris: Secondary | ICD-10-CM

## 2016-01-16 DIAGNOSIS — M75102 Unspecified rotator cuff tear or rupture of left shoulder, not specified as traumatic: Secondary | ICD-10-CM

## 2016-01-16 DIAGNOSIS — M12512 Traumatic arthropathy, left shoulder: Secondary | ICD-10-CM

## 2016-01-16 DIAGNOSIS — E785 Hyperlipidemia, unspecified: Secondary | ICD-10-CM | POA: Diagnosis not present

## 2016-01-16 DIAGNOSIS — M12812 Other specific arthropathies, not elsewhere classified, left shoulder: Secondary | ICD-10-CM

## 2016-01-16 NOTE — Assessment & Plan Note (Signed)
Acute onset left shoulder pain  This past weekend 2 days ago  etiology unclear. Recommended close follow-up with orthopedics  may need physical therapy,  Imaging with MRI if symptoms do not improve

## 2016-01-16 NOTE — Assessment & Plan Note (Signed)
Currently with no symptoms of angina. No further workup at this time. Continue current medication regimen. 

## 2016-01-16 NOTE — Patient Instructions (Signed)
You are doing well. No medication changes were made.  Please call us if you have new issues that need to be addressed before your next appt.  Your physician wants you to follow-up in: 6 months.  You will receive a reminder letter in the mail two months in advance. If you don't receive a letter, please call our office to schedule the follow-up appointment.   

## 2016-01-16 NOTE — Assessment & Plan Note (Signed)
Cholesterol is at goal on the current lipid regimen. No changes to the medications were made.  

## 2016-01-16 NOTE — Progress Notes (Signed)
Patient ID: William Taylor, male    DOB: 06/21/1930, 82 y.o.   MRN: [id number]  HPI Comments: William Taylor is a very pleasant 82 year old gentleman with a history of coronary artery disease, stent in 2005, atrial fibrillation with RVR in the setting of UTI September 2011, stent placement May 2012 to the OM 2, PE following catheterization started on anticoagulation, atrial fibrillation in 2013, who presents for follow-up of his coronary disease and atrial fibrillation History of anemia   in follow-up today, he reports having injury of his left shoulder  was finishing dinner, heard a pop with acute pain in his left shoulder area extending down into his biceps region/triceps region  went to the emergency room, was told he had torn rotator cuff, told to apply heat, given prednisone by emergency room physician  today presents in a sling   pain has started to improve.  Is limited range of motion while in the sling.  No appointment to see orthopedics so far   lab work reviewed with him showing normal cardiac enzymes in the hospital,  EKG from the hospital unchanged   total cholesterol 113, LDL 67   other past medical history   previous episode ofchest pain, went to the emergency room  November 2016. Ruled out for MI, had Myoview showing no ischemia. He was noted to have atrial tachycardia with rates up to the 150 range, metoprolol tartrate increased up to 50 mg twice a day. On the higher dose metoprolol, he has not had any further episodes of tachycardia since discharge. Previously he did note heart rate up to 190 going down to 150, measured with a pulse oximeter. None recently  catheterization in 2009 showing patent stent with no other significant disease, normal systolic function in September 2011 who presented to the emergency room September 2011 with malaise, atrial fibrillation with RVR in the setting of a urinary tract infection, recent admission to the hospital November 14 2010 for atrial  fibrillation that developed after pushing mowing with some chest pain. He converted back to normal sinus rhythm, Presenting to [facility] with chest discomfort with cardiac catheterization Dec 25 2010, showing 90% OM 2 disease, transferred to [facility]  with DES stent placed, 2.25 x 16 mm POMUS stent, with flank pain developing after discharge, admitted to [facility] and diagnosed with pulmonary embolism with heavy clot burden.   Found to be in atrial fibrillation in 2013. He had symptoms of shortness of breath with exertion.  After one month on xarelto, amiodarone load and titration downward was started with conversion to NSR in October 2013  EKG the end of October 2013 confirmed normal sinus rhythm.   History of back surgery in 2008, history of prostate cancer followed by Dr. Jacqlyn Larsen   He was admitted to the hospital 10/27/2013 with chest pain. He had held his Plavix for colonoscopy 10/26/2013 with polypectomy x3. Cardiac catheterization performed given his severe chest pain and no disease showing severe small vessel disease, patent stents Amiodarone was held for bradycardia, he was started on isosorbide 30 mg daily, Plavix was held, he was continued on aspirin and xarelto.  Cardiac catheterization results; 90% disease at the ostium of a small diagonal vessel, small OM vessel with 80% disease in the proximal region Otherwise stents are patent  Echocardiogram 10/28/2013 shows normal LV function, mildly dilated left atrium, mild aortic regurg, normal right ventricular systolic pressures  Allergies  Allergen Reactions  . Bee Venom Swelling  . Warfarin And Related Other (See Comments)  Chest pain    Outpatient Encounter Prescriptions as of 01/16/2016  Medication Sig  . cyproheptadine (PERIACTIN) 4 MG tablet Take 1 tablet (4 mg total) by mouth 3 (three) times daily as needed for allergies.  Marland Kitchen diltiazem (CARTIA XT) 240 MG 24 hr capsule Take 1 capsule (240 mg total) by mouth daily.  Marland Kitchen esomeprazole (NEXIUM)  40 MG capsule TAKE 1 CAPSULE DAILY AT 12 NOON  . fenofibrate micronized (ANTARA) 130 MG capsule TAKE 1 CAPSULE DAILY BEFORE BREAKFAST  . finasteride (PROSCAR) 5 MG tablet Take 5 mg by mouth daily.    Marland Kitchen HYDROcodone-acetaminophen (NORCO/VICODIN) 5-325 MG tablet Take 1-2 tablets by mouth 3 (three) times daily as needed for moderate pain.  . isosorbide mononitrate (IMDUR) 30 MG 24 hr tablet TAKE 1 TABLET DAILY  . levobunolol (BETAGAN) 0.5 % ophthalmic solution 1 drop 2 (two) times daily.   Marland Kitchen lisinopril (PRINIVIL,ZESTRIL) 5 MG tablet TAKE 1 TABLET DAILY  . methylPREDNISolone (MEDROL DOSEPAK) 4 MG TBPK tablet Take as directed  . metoprolol (LOPRESSOR) 50 MG tablet TAKE ONE TABLET (50 MG) TWICE A DAY.  . naproxen (NAPROSYN) 250 MG tablet 1 tablet twice daily 5 days as needed for pain  . nitroGLYCERIN (NITROSTAT) 0.4 MG SL tablet DISSOLVE 1 TABLET UNDER THE TONGUE EVERY 5 MINUTES AS NEEDED FOR CHEST PAIN  . Omega-3 Fatty Acids (FISH OIL) 1000 MG CAPS Take 1,000 mg by mouth daily.  Marland Kitchen oxyCODONE-acetaminophen (ROXICET) 5-325 MG tablet Take 1 tablet by mouth every 4 (four) hours as needed for severe pain.  . pravastatin (PRAVACHOL) 40 MG tablet Take 1 tablet (40 mg total) by mouth at bedtime.  . Probiotic Product (PROBIOTIC PO) Take by mouth.  . Tamsulosin HCl (FLOMAX) 0.4 MG CAPS Take 0.4 mg by mouth daily.  Alveda Reasons 20 MG TABS tablet TAKE 1 TABLET DAILY   No facility-administered encounter medications on file as of 01/16/2016.    Past Medical History  Diagnosis Date  . Coronary artery disease   . Hypertension   . Hyperlipidemia   . PAF (paroxysmal atrial fibrillation) ([place])     a. on xarelto  . Cancer [facility])     Prostate, followed by Dr. Jacqlyn Larsen  . MI (myocardial infarction) ([place])     2015  . Skin cancer   . Colon polyp     Past Surgical History  Procedure Laterality Date  . Cataract extraction  Oct. 3, 2012    right eye  . Hernia repair  2013  . Lumbar spine surgery    . Colonoscopy     . Colonoscopy w/ polypectomy  2015    Dr Rayann Heman  . Coronary angioplasty  2009    2005; s/p stent  . Cardiac catheterization  10/2013    [facility]  . Upper gi endoscopy  Sept 2015    Dr Rayann Heman    Social History  reports that he has never smoked. He has never used smokeless tobacco. He reports that he does not drink alcohol or use illicit drugs.  Family History family history includes Colon cancer in his father; Stroke in his mother.   Review of Systems  Constitutional: Negative.   Respiratory: Negative.   Cardiovascular: Negative.   Gastrointestinal: Negative.   Musculoskeletal: Positive for back pain and arthralgias.       Left shoulder pain  Skin: Negative.   Neurological: Negative.   Hematological: Negative.   Psychiatric/Behavioral: Negative.   All other systems reviewed and are negative.  BP 132/62 mmHg  Pulse 65  Ht 6\' 4"  (1.93 m)  Wt 231 lb 12 oz (105.121 kg)  BMI 28.22 kg/m2  Physical Exam  Constitutional: He is oriented to person, place, and time. He appears well-developed and well-nourished.  HENT:  Head: Normocephalic.  Nose: Nose normal.  Mouth/Throat: Oropharynx is clear and moist.  Eyes: Conjunctivae are normal. Pupils are equal, round, and reactive to light.  Neck: Normal range of motion. Neck supple. No JVD present.  Cardiovascular: S1 normal, S2 normal, normal heart sounds and intact distal pulses.  An irregularly irregular rhythm present. Bradycardia present.  Exam reveals no gallop and no friction rub.   No murmur heard. Pulmonary/Chest: Effort normal and breath sounds normal. No respiratory distress. He has no wheezes. He has no rales. He exhibits no tenderness.  Abdominal: Soft. Bowel sounds are normal. He exhibits no distension. There is no tenderness.  Musculoskeletal: He exhibits no edema or tenderness.  Left shoulder in a sling, pain with movement per the patient  Lymphadenopathy:    He has no cervical adenopathy.  Neurological: He is alert and  oriented to person, place, and time. Coordination normal.  Skin: Skin is warm and dry. No rash noted. No erythema.  Psychiatric: He has a normal mood and affect. His behavior is normal. Judgment and thought content normal.      Assessment and Plan   Nursing note and vitals reviewed.

## 2016-01-16 NOTE — Assessment & Plan Note (Signed)
Blood pressure is well controlled on today's visit. No changes made to the medications. 

## 2016-01-26 ENCOUNTER — Other Ambulatory Visit: Payer: Self-pay | Admitting: Physician Assistant

## 2016-01-26 DIAGNOSIS — M7542 Impingement syndrome of left shoulder: Secondary | ICD-10-CM

## 2016-02-19 ENCOUNTER — Ambulatory Visit
Admission: RE | Admit: 2016-02-19 | Discharge: 2016-02-19 | Disposition: A | Discharge status: Home / Self Care | Payer: Medicare Other | Source: Ambulatory Visit | Attending: Physician Assistant | Admitting: Physician Assistant

## 2016-02-19 DIAGNOSIS — M75122 Complete rotator cuff tear or rupture of left shoulder, not specified as traumatic: Secondary | ICD-10-CM | POA: Diagnosis not present

## 2016-02-19 DIAGNOSIS — M19012 Primary osteoarthritis, left shoulder: Secondary | ICD-10-CM | POA: Insufficient documentation

## 2016-02-19 DIAGNOSIS — M7542 Impingement syndrome of left shoulder: Secondary | ICD-10-CM | POA: Insufficient documentation

## 2016-03-08 DIAGNOSIS — M75122 Complete rotator cuff tear or rupture of left shoulder, not specified as traumatic: Secondary | ICD-10-CM | POA: Insufficient documentation

## 2016-03-22 ENCOUNTER — Other Ambulatory Visit: Payer: Self-pay | Admitting: Internal Medicine

## 2016-03-22 DIAGNOSIS — Z760 Encounter for issue of repeat prescription: Secondary | ICD-10-CM

## 2016-03-22 DIAGNOSIS — E785 Hyperlipidemia, unspecified: Secondary | ICD-10-CM

## 2016-03-24 MED ORDER — PRAVASTATIN SODIUM 40 MG PO TABS: 40.0000 mg | ORAL_TABLET | Freq: Every day | ORAL | 3 refills | Status: DC

## 2016-03-24 NOTE — Telephone Encounter (Signed)
Please call patient and let him know pravachol refilled.   I have pended lipid panel and would like for him to make cholesterol f/u appt with me in one month.  You may cancel appt with dr walker that he has right now.  He can do the lab after our visit with lab appointment.   Advise to let us know if he experiences any muscle cramps, aches on cholesterol meds - he is on two ( also antara) and I have concern for myalgia.

## 2016-03-29 NOTE — Telephone Encounter (Signed)
Spoke to patient .  He stated he has already schedule an appointment with Dr. Lacinda Axon and he will be new PCP

## 2016-04-02 ENCOUNTER — Other Ambulatory Visit: Payer: Self-pay

## 2016-04-02 MED ORDER — DILTIAZEM HCL ER COATED BEADS 240 MG PO CP24: 240.0000 mg | ORAL_CAPSULE | Freq: Every day | ORAL | 2 refills | Status: DC

## 2016-05-03 ENCOUNTER — Ambulatory Visit (INDEPENDENT_AMBULATORY_CARE_PROVIDER_SITE_OTHER): Payer: Medicare Other | Admitting: Family Medicine

## 2016-05-03 ENCOUNTER — Encounter: Payer: Self-pay | Admitting: Family Medicine

## 2016-05-03 VITALS — BP 151/85 | HR 68 | Temp 97.9°F | Wt 230.5 lb

## 2016-05-03 DIAGNOSIS — R0982 Postnasal drip: Secondary | ICD-10-CM | POA: Insufficient documentation

## 2016-05-03 DIAGNOSIS — Z23 Encounter for immunization: Secondary | ICD-10-CM | POA: Diagnosis not present

## 2016-05-03 LAB — POCT RAPID STREP A (OFFICE): RAPID STREP A SCREEN: NEGATIVE

## 2016-05-03 MED ORDER — FLUTICASONE PROPIONATE 50 MCG/ACT NA SUSP: 2.0000 | Freq: Every day | NASAL | 6 refills | Status: DC

## 2016-05-03 MED ORDER — CETIRIZINE HCL 10 MG PO TABS: 10.0000 mg | ORAL_TABLET | Freq: Every day | ORAL | 0 refills | Status: DC

## 2016-05-03 NOTE — Assessment & Plan Note (Signed)
New problem. Patient with mild sore throat which is likely secondary to postnasal drip. Treating with Flonase and Zyrtec.

## 2016-05-03 NOTE — Progress Notes (Signed)
   Subjective:  Patient ID: William Taylor, male    DOB: 1930-06-16  Age: 82 y.o. MRN: [id number]  CC: Sore throat  HPI:  82 year old male presents with complaints of sore throat.  Patient reports a one-week history of sore throat. He states that he feels like there is phlegm in his throat. No associated fevers or chills. He's been using salt water gargles without significant improvement. No other interventions tried. No known exacerbating factors. No other associated symptoms. No other complaints at this time.  Social Hx   Social History   Social History  . Marital status: Married    Spouse name: N/A  . Number of children: N/A  . Years of education: N/A   Social History Main Topics  . Smoking status: Never Smoker  . Smokeless tobacco: Never Used  . Alcohol use No  . Drug use: No  . Sexual activity: Not Asked   Other Topics Concern  . None   Social History Narrative  . None    Review of Systems  Constitutional: Negative.   HENT: Positive for sore throat.    Objective:  BP (!) 151/85 (BP Location: Right Arm, Patient Position: Sitting, Cuff Size: Normal)   Pulse 68   Temp 97.9 F (36.6 C) (Oral)   Wt 230 lb 8 oz (104.6 kg)   SpO2 96%   BMI 28.06 kg/m   BP/Weight 05/03/2016 01/16/2016 99991111  Systolic BP 123XX123 [id number]  Diastolic BP 85 62 71  Wt. (Lbs) 230.5 231.75 -  BMI 28.06 28.22 -   Physical Exam  Constitutional: He appears well-developed. No distress.  HENT:  Mouth/Throat: Oropharynx is clear and moist.  Normal TM's.  Neck: Neck supple.  Cardiovascular: Normal rate and regular rhythm.   Pulmonary/Chest: Effort normal. He has no wheezes. He has no rales.  Lymphadenopathy:    He has no cervical adenopathy.  Neurological: He is alert.  Vitals reviewed.  Lab Results  Component Value Date   WBC 10.4 01/14/2016   HGB 11.9 (L) 01/14/2016   HCT 33.0 (L) 01/14/2016   PLT 212 01/14/2016   GLUCOSE 144 (H) 01/14/2016   CHOL 113 05/18/2015   TRIG 58.0  05/18/2015   HDL 33.80 (L) 05/18/2015   LDLCALC 67 05/18/2015   ALT 13 12/27/2015   AST 18 12/27/2015   NA 139 01/14/2016   K 3.9 01/14/2016   CL 107 01/14/2016   CREATININE 1.35 (H) 01/14/2016   BUN 21 (H) 01/14/2016   CO2 25 01/14/2016   INR 2.43 01/14/2016    Assessment & Plan:   Problem List Items Addressed This Visit    Post-nasal drip - Primary    New problem. Patient with mild sore throat which is likely secondary to postnasal drip. Treating with Flonase and Zyrtec.      Relevant Orders   POCT rapid strep A (Completed)   Culture, Group A Strep    Other Visit Diagnoses   None.     Meds ordered this encounter  Medications  . fluticasone (FLONASE) 50 MCG/ACT nasal spray    Sig: Place 2 sprays into both nostrils daily.    Dispense:  16 g    Refill:  6  . cetirizine (ZYRTEC) 10 MG tablet    Sig: Take 1 tablet (10 mg total) by mouth daily.    Dispense:  30 tablet    Refill:  0    Follow-up: PRN  [place]

## 2016-05-03 NOTE — Progress Notes (Signed)
Pre visit review using our clinic review tool, if applicable. No additional management support is needed unless otherwise documented below in the visit note. 

## 2016-05-03 NOTE — Patient Instructions (Signed)
Use the flonase and the zyrtec as prescribed.  Give it some time.  Have fun on your trip.  Take care  Dr Lacinda Axon

## 2016-05-05 LAB — CULTURE, GROUP A STREP: Organism ID, Bacteria: NORMAL

## 2016-05-06 ENCOUNTER — Telehealth: Payer: Self-pay | Admitting: Family Medicine

## 2016-05-06 NOTE — Telephone Encounter (Signed)
Notified patient of lab results 

## 2016-05-06 NOTE — Telephone Encounter (Signed)
Patient returned called for his lab results.

## 2016-06-09 ENCOUNTER — Telehealth: Payer: Medicare Other | Admitting: Physician Assistant

## 2016-06-09 DIAGNOSIS — B9689 Other specified bacterial agents as the cause of diseases classified elsewhere: Secondary | ICD-10-CM

## 2016-06-09 DIAGNOSIS — J208 Acute bronchitis due to other specified organisms: Secondary | ICD-10-CM

## 2016-06-09 MED ORDER — BENZONATATE 100 MG PO CAPS: 100.0000 mg | ORAL_CAPSULE | Freq: Two times a day (BID) | ORAL | 0 refills | Status: DC | PRN

## 2016-06-09 MED ORDER — AZITHROMYCIN 250 MG PO TABS: ORAL_TABLET | ORAL | 0 refills | Status: DC

## 2016-06-09 NOTE — Progress Notes (Signed)
We are sorry that you are not feeling well.  Here is how we plan to help!  Based on what you have shared with me it looks like you have upper respiratory tract inflammation that has resulted in a significant cough.  Inflammation and infection in the upper respiratory tract is commonly called bronchitis and has four common causes:  Allergies, Viral Infections, Acid Reflux and Bacterial Infections.  Allergies, viruses and acid reflux are treated by controlling symptoms or eliminating the cause. An example might be a cough caused by taking certain blood pressure medications. You stop the cough by changing the medication. Another example might be a cough caused by acid reflux. Controlling the reflux helps control the cough.  Based on your presentation I believe you most likely have A cough due to bacteria.  When patients have a fever and/or a productive cough with a change in color or increased sputum production, we are concerned about bacterial bronchitis.  If left untreated it can progress to pneumonia.  If your symptoms do not improve with your treatment plan it is important that you contact your provider.   I have prescribed Azithromyin 250 mg: two tables now and then one tablet daily for 4 additonal days   In addition you may use A prescription cough medication called Tessalon Perles 100mg . You may take 1-2 capsules every 8 hours as needed for your cough.    HOME CARE . Only take medications as instructed by your medical team. . Complete the entire course of an antibiotic. . Drink plenty of fluids and get plenty of rest. . Avoid close contacts especially the very young and the elderly . Cover your mouth if you cough or cough into your sleeve. . Always remember to wash your hands . A steam or ultrasonic humidifier can help congestion.    GET HELP RIGHT AWAY IF: . You develop worsening fever. . You become short of breath . You cough up blood. . Your symptoms persist after you have completed your  treatment plan MAKE SURE YOU   Understand these instructions.  Will watch your condition.  Will get help right away if you are not doing well or get worse.  Your e-visit answers were reviewed by a board certified advanced clinical practitioner to complete your personal care plan.  Depending on the condition, your plan could have included both over the counter or prescription medications. If there is a problem please reply  once you have received a response from your provider. Your safety is important to [place].  If you have drug allergies check your prescription carefully.    You can use MyChart to ask questions about today's visit, request a non-urgent call back, or ask for a work or school excuse for 24 hours related to this e-Visit. If it has been greater than 24 hours you will need to follow up with your provider, or enter a new e-Visit to address those concerns. You will get an e-mail in the next two days asking about your experience.  I hope that your e-visit has been valuable and will speed your recovery. Thank you for using e-visits.

## 2016-06-10 ENCOUNTER — Other Ambulatory Visit: Payer: Self-pay

## 2016-06-10 MED ORDER — ESOMEPRAZOLE MAGNESIUM 40 MG PO CPDR: DELAYED_RELEASE_CAPSULE | ORAL | 1 refills | Status: DC

## 2016-06-11 ENCOUNTER — Other Ambulatory Visit: Payer: Self-pay | Admitting: Cardiovascular Disease

## 2016-06-28 ENCOUNTER — Encounter: Payer: Self-pay | Admitting: Family Medicine

## 2016-06-28 ENCOUNTER — Ambulatory Visit (INDEPENDENT_AMBULATORY_CARE_PROVIDER_SITE_OTHER): Payer: Medicare Other | Admitting: Family Medicine

## 2016-06-28 ENCOUNTER — Ambulatory Visit: Payer: Medicare Other | Admitting: Internal Medicine

## 2016-06-28 VITALS — BP 116/68 | HR 65 | Temp 97.4°F | Resp 14 | Wt 226.4 lb

## 2016-06-28 DIAGNOSIS — E785 Hyperlipidemia, unspecified: Secondary | ICD-10-CM | POA: Diagnosis not present

## 2016-06-28 DIAGNOSIS — I4891 Unspecified atrial fibrillation: Secondary | ICD-10-CM

## 2016-06-28 DIAGNOSIS — I10 Essential (primary) hypertension: Secondary | ICD-10-CM

## 2016-06-28 DIAGNOSIS — D649 Anemia, unspecified: Secondary | ICD-10-CM

## 2016-06-28 DIAGNOSIS — I2510 Atherosclerotic heart disease of native coronary artery without angina pectoris: Secondary | ICD-10-CM

## 2016-06-28 DIAGNOSIS — R739 Hyperglycemia, unspecified: Secondary | ICD-10-CM

## 2016-06-28 LAB — CBC
HCT: 28.7 % — ABNORMAL LOW (ref 39.0–52.0)
Hemoglobin: 10 g/dL — ABNORMAL LOW (ref 13.0–17.0)
MCHC: 34.8 g/dL (ref 30.0–36.0)
MCV: 91.6 fl (ref 78.0–100.0)
PLATELETS: 258 10*3/uL (ref 150.0–400.0)
RBC: 3.14 Mil/uL — AB (ref 4.22–5.81)
RDW: 16.8 % — ABNORMAL HIGH (ref 11.5–15.5)
WBC: 6.3 10*3/uL (ref 4.0–10.5)

## 2016-06-28 LAB — LIPID PANEL
CHOLESTEROL: 110 mg/dL (ref 0–200)
HDL: 29.9 mg/dL — AB (ref >39.00)
LDL CALC: 64 mg/dL (ref 0–99)
NonHDL: 80.38
Total CHOL/HDL Ratio: 4
Triglycerides: 80 mg/dL (ref 0.0–149.0)
VLDL: 16 mg/dL (ref 0.0–40.0)

## 2016-06-28 LAB — HEMOGLOBIN A1C: HEMOGLOBIN A1C: 6.5 % (ref 4.6–6.5)

## 2016-06-28 LAB — VITAMIN B12: VITAMIN B 12: 336 pg/mL (ref 211–911)

## 2016-06-28 MED ORDER — ESOMEPRAZOLE MAGNESIUM 40 MG PO CPDR: DELAYED_RELEASE_CAPSULE | ORAL | 3 refills | Status: DC

## 2016-06-28 NOTE — Assessment & Plan Note (Signed)
Well controlled. Continue pravastatin and fenofibrate. Lipid panel today.

## 2016-06-28 NOTE — Assessment & Plan Note (Signed)
Stable at this time.  Continue current medications. 

## 2016-06-28 NOTE — Assessment & Plan Note (Signed)
Unclear etiology.  Labs today.

## 2016-06-28 NOTE — Patient Instructions (Signed)
You're doing well.  Continue your meds.  I'll see you in 6 months.  Take care  Dr. Lacinda Axon

## 2016-06-28 NOTE — Assessment & Plan Note (Signed)
Well controlled. Continue lisinopril, Toprol, diltiazem.

## 2016-06-28 NOTE — Progress Notes (Signed)
Subjective:  Patient ID: William Taylor, male    DOB: [birth year]  Age: 82 y.o. MRN: [id number]  CC: 6 month follow up  HPI:  82 year old male with history of pulmonary embolism, atrial fibrillation on anticoagulation, history of prostate cancer, CAD status post PCI, CKD, hypertension, hyperlipidemia, anemia presents for follow-up.  HTN  Well controlled on lisinopril, metoprolol, diltiazem.  HLD  Has been well controlled on pravastatin and fenofibrate.  Needs labs today.  CAD  Currently stable.  Patient asymptomatic at this time.  Compliant with xarelto, statin, metoprolol, lisinopril, Imdur.  Atrial fib  Stable on Xarelto, diltiazem, metoprolol.  Anemia  Patient's most recent labs reveal mild anemia with a hemoglobin of 11.9. Macrocytic anemia.  This has not been reassessed.  Needs laboratory studies today.  Social Hx   Social History   Social History  . Marital status: Married    Spouse name: N/A  . Number of children: N/A  . Years of education: N/A   Social History Main Topics  . Smoking status: Never Smoker  . Smokeless tobacco: Never Used  . Alcohol use No  . Drug use: No  . Sexual activity: Not Asked   Other Topics Concern  . None   Social History Narrative  . None    Review of Systems  Constitutional: Negative.   Respiratory: Negative.   Cardiovascular: Negative.   Musculoskeletal:       Shoulder pain.   Objective:  BP 116/68 (BP Location: Right Arm, Patient Position: Sitting, Cuff Size: Large)   Pulse 65   Temp 97.4 F (36.3 C) (Oral)   Resp 14   Wt 226 lb 6 oz (102.7 kg)   SpO2 94%   BMI 27.56 kg/m   BP/Weight 06/28/2016 05/03/2016 [id number]  Systolic BP 99991111 [id number] [id number]  Diastolic BP 68 85 62  Wt. (Lbs) 226.38 230.5 231.75  BMI 27.56 28.06 28.22   Physical Exam  Constitutional: He is oriented to person, place, and time. He appears well-developed. No distress.  Cardiovascular: Normal rate and regular rhythm.     Pulmonary/Chest: Effort normal. He has no wheezes. He has no rales.  Musculoskeletal: He exhibits no edema.  Neurological: He is alert and oriented to person, place, and time.  Psychiatric: He has a normal mood and affect.  Vitals reviewed.  Lab Results  Component Value Date   WBC 10.4 01/14/2016   HGB 11.9 (L) 01/14/2016   HCT 33.0 (L) 01/14/2016   PLT 212 01/14/2016   GLUCOSE 144 (H) 01/14/2016   CHOL 113 05/18/2015   TRIG 58.0 05/18/2015   HDL 33.80 (L) 05/18/2015   LDLCALC 67 05/18/2015   ALT 13 12/27/2015   AST 18 12/27/2015   NA 139 01/14/2016   K 3.9 01/14/2016   CL 107 01/14/2016   CREATININE 1.35 (H) 01/14/2016   BUN 21 (H) 01/14/2016   CO2 25 01/14/2016   INR 2.43 01/14/2016    Assessment & Plan:   Problem List Items Addressed This Visit    Hypertension - Primary    Well controlled. Continue lisinopril, Toprol, diltiazem.      Hyperlipidemia    Well controlled. Continue pravastatin and fenofibrate. Lipid panel today.      Relevant Orders   Lipid panel   CAD (coronary artery disease)    Stable at this time. Continue current medications.      Anemia    Unclear etiology. Labs today.      Relevant Orders   CBC   Iron,  TIBC and Ferritin Panel   B12    Other Visit Diagnoses    Blood glucose elevated       Relevant Orders   Hemoglobin A1c      Meds ordered this encounter  Medications  . esomeprazole (NEXIUM) 40 MG capsule    Sig: TAKE 1 CAPSULE DAILY AT 12 NOON    Dispense:  90 capsule    Refill:  3    Follow-up: Return in about 6 months (around 12/26/2016).  [place]

## 2016-06-28 NOTE — Progress Notes (Signed)
Pre visit review using our clinic review tool, if applicable. No additional management support is needed unless otherwise documented below in the visit note. 

## 2016-06-28 NOTE — Assessment & Plan Note (Signed)
Stable. Continue xarelto, diltiazem, metoprolol.

## 2016-06-29 LAB — IRON,TIBC AND FERRITIN PANEL
%SAT: 15 % (ref 15–60)
FERRITIN: 9 ng/mL — AB (ref 20–380)
IRON: 75 ug/dL (ref 50–180)
TIBC: 500 ug/dL — ABNORMAL HIGH (ref 250–425)

## 2016-07-05 ENCOUNTER — Other Ambulatory Visit (INDEPENDENT_AMBULATORY_CARE_PROVIDER_SITE_OTHER): Payer: Medicare Other

## 2016-07-05 ENCOUNTER — Other Ambulatory Visit: Payer: Self-pay

## 2016-07-05 DIAGNOSIS — Z1211 Encounter for screening for malignant neoplasm of colon: Secondary | ICD-10-CM | POA: Diagnosis not present

## 2016-07-05 LAB — FECAL OCCULT BLOOD, IMMUNOCHEMICAL: FECAL OCCULT BLD: POSITIVE — AB

## 2016-07-08 ENCOUNTER — Other Ambulatory Visit: Payer: Self-pay | Admitting: Family Medicine

## 2016-07-08 DIAGNOSIS — D649 Anemia, unspecified: Secondary | ICD-10-CM

## 2016-07-08 DIAGNOSIS — K921 Melena: Secondary | ICD-10-CM

## 2016-07-18 ENCOUNTER — Encounter: Payer: Self-pay | Admitting: Cardiovascular Disease

## 2016-07-18 ENCOUNTER — Ambulatory Visit (INDEPENDENT_AMBULATORY_CARE_PROVIDER_SITE_OTHER): Payer: Medicare Other | Admitting: Cardiovascular Disease

## 2016-07-18 VITALS — BP 124/82 | HR 69 | Ht 76.0 in | Wt 228.0 lb

## 2016-07-18 DIAGNOSIS — Z7189 Other specified counseling: Secondary | ICD-10-CM

## 2016-07-18 DIAGNOSIS — I2510 Atherosclerotic heart disease of native coronary artery without angina pectoris: Secondary | ICD-10-CM | POA: Diagnosis not present

## 2016-07-18 DIAGNOSIS — R0602 Shortness of breath: Secondary | ICD-10-CM

## 2016-07-18 DIAGNOSIS — E782 Mixed hyperlipidemia: Secondary | ICD-10-CM

## 2016-07-18 DIAGNOSIS — I2782 Chronic pulmonary embolism: Secondary | ICD-10-CM

## 2016-07-18 DIAGNOSIS — N183 Chronic kidney disease, stage 3 unspecified: Secondary | ICD-10-CM

## 2016-07-18 DIAGNOSIS — I4891 Unspecified atrial fibrillation: Secondary | ICD-10-CM | POA: Diagnosis not present

## 2016-07-18 DIAGNOSIS — D649 Anemia, unspecified: Secondary | ICD-10-CM

## 2016-07-18 NOTE — Progress Notes (Signed)
Cardiology Office Note  Date:  07/18/2016   ID:  William Taylor, William Taylor February 11, 1930, MRN [id number]  PCP:  William Spikes, DO   Chief Complaint  Patient presents with  . other    6 month f/u c/o sob. Meds reviewed verbally with pt.    HPI:  William Taylor is a very pleasant 82 year old gentleman with a history of coronary artery disease, stent in 2005, atrial fibrillation with RVR in the setting of UTI September 2011, stent placement May 2012 to the OM 2, PE following catheterization started on anticoagulation, atrial fibrillation in 2013, who presents for follow-up of his coronary disease and atrial fibrillation History of anemia  In follow-up today, he reports that he has symptoms of shortness of breath, significant fatigue with exertion. Denies any palpitations concerning for arrhythmia  Takes NTG sparingly for rare episodes of chest pain. Does not feel that these are getting worse, not having them frequently. Once every few months. Details very nonspecific  Worsening Anemia, drop in HCT 28 guiac positive. Scheduled to see GI for evaluation dec 2017 On xarelto  Other lab work reviewed  Total chol 110, LDL 64 HBA1C 6.5   previous  injury of his left shoulder, rotator cuff tear   EKG on today's visit shows normal sinus rhythm with rate 69 bpm, no significant ST or T-wave changes    other past medical history  previous episode ofchest pain, went to the emergency room  November 2016. Ruled out for MI, had Myoview showing no ischemia. He was noted to have atrial tachycardia with rates up to the 150 range, metoprolol tartrate increased up to 50 mg twice a day. On the higher dose metoprolol, he has not had any further episodes of tachycardia since discharge. Previously he did note heart rate up to 190 going down to 150, measured with a pulse oximeter. None recently  catheterization in 2009 showing patent stent with no other significant disease, normal systolic function in September 2011  who presented to the emergency room September 2011 with malaise, atrial fibrillation with RVR in the setting of a urinary tract infection, recent admission to the hospital November 14 2010 for atrial fibrillation that developed after pushing mowing with some chest pain. He converted back to normal sinus rhythm, Presenting to [facility] with chest discomfort with cardiac catheterization Dec 25 2010, showing 90% OM 2 disease, transferred to [facility]  with DES stent placed, 2.25 x 16 mm POMUS stent, with flank pain developing after discharge, admitted to [facility] and diagnosed with pulmonary embolism with heavy clot burden.   Found to be in atrial fibrillation in 2013. He had symptoms of shortness of breath with exertion.  After one month on xarelto, amiodarone load and titration downward was started with conversion to NSR in October 2013  EKG the end of October 2013 confirmed normal sinus rhythm.   History of back surgery in 2008, history of prostate cancer followed by William Taylor   He was admitted to the hospital 10/27/2013 with chest pain. He had held his Plavix for colonoscopy 10/26/2013 with polypectomy x3. Cardiac catheterization performed given his severe chest pain and no disease showing severe small vessel disease, patent stents Amiodarone was held for bradycardia, he was started on isosorbide 30 mg daily, Plavix was held, he was continued on aspirin and xarelto.  Cardiac catheterization results; 90% disease at the ostium of a small diagonal vessel, small OM vessel with 80% disease in the proximal region Otherwise stents are patent  Echocardiogram 10/28/2013  shows normal LV function, mildly dilated left atrium, mild aortic regurg, normal right ventricular systolic pressures   PMH:   has a past medical history of Cancer ([name]); Colon polyp; Coronary artery disease; Hyperlipidemia; Hypertension; MI (myocardial infarction); PAF (paroxysmal atrial fibrillation) ([place]); and Skin cancer.  PSH:    Past  Surgical History:  Procedure Laterality Date  . CARDIAC CATHETERIZATION  10/2013   [facility]  . CATARACT EXTRACTION  Oct. 3, 2012   right eye  . colonoscopy    . COLONOSCOPY W/ POLYPECTOMY  2015   William Taylor  . CORONARY ANGIOPLASTY  2009   2005; s/p stent  . HERNIA REPAIR  2013  . LUMBAR SPINE SURGERY    . UPPER GI ENDOSCOPY  Sept 2015   William Taylor    Current Outpatient Prescriptions  Medication Sig Dispense Refill  . cetirizine (ZYRTEC) 10 MG tablet Take 1 tablet (10 mg total) by mouth daily. 30 tablet 0  . cyproheptadine (PERIACTIN) 4 MG tablet Take 1 tablet (4 mg total) by mouth 3 (three) times daily as needed for allergies. 90 tablet 3  . diltiazem (CARTIA XT) 240 MG 24 hr capsule Take 1 capsule (240 mg total) by mouth daily. 90 capsule 2  . esomeprazole (NEXIUM) 40 MG capsule TAKE 1 CAPSULE DAILY AT 12 NOON 90 capsule 3  . fenofibrate micronized (ANTARA) 130 MG capsule TAKE 1 CAPSULE DAILY BEFORE BREAKFAST 90 capsule 2  . finasteride (PROSCAR) 5 MG tablet Take 5 mg by mouth daily.      . fluticasone (FLONASE) 50 MCG/ACT nasal spray Place 2 sprays into both nostrils daily. 16 g 6  . isosorbide mononitrate (IMDUR) 30 MG 24 hr tablet TAKE 1 TABLET DAILY 90 tablet 3  . levobunolol (BETAGAN) 0.5 % ophthalmic solution 1 drop 2 (two) times daily.     Marland Kitchen lisinopril (PRINIVIL,ZESTRIL) 5 MG tablet TAKE 1 TABLET DAILY 90 tablet 3  . metoprolol (LOPRESSOR) 50 MG tablet TAKE ONE TABLET (50 MG) TWICE A DAY. 180 tablet 3  . nitroGLYCERIN (NITROSTAT) 0.4 MG SL tablet DISSOLVE UNDER THE TONGUE 1 TABLET EVERY 5 MINUTES AS NEEDED FOR CHEST PAIN 25 tablet 3  . Omega-3 Fatty Acids (FISH OIL) 1000 MG CAPS Take 1,000 mg by mouth daily.    . pravastatin (PRAVACHOL) 40 MG tablet Take 1 tablet (40 mg total) by mouth at bedtime. 90 tablet 3  . Tamsulosin HCl (FLOMAX) 0.4 MG CAPS Take 0.4 mg by mouth daily.    Alveda Reasons 20 MG TABS tablet TAKE 1 TABLET DAILY 90 tablet 3   No current facility-administered medications  for this visit.      Allergies:   Bee venom; Sulfa antibiotics; and Warfarin and related   Social History:  The patient  reports that he has never smoked. He has never used smokeless tobacco. He reports that he does not drink alcohol or use drugs.   Family History:   family history includes Colon cancer in his father; Stroke in his mother.    Review of Systems: Review of Systems  Constitutional: Positive for malaise/fatigue.  Respiratory: Positive for shortness of breath.   Cardiovascular: Negative.   Gastrointestinal: Negative.   Musculoskeletal: Negative.   Neurological: Positive for weakness.  Psychiatric/Behavioral: Negative.   All other systems reviewed and are negative.    PHYSICAL EXAM: VS:  BP 124/82 (BP Location: Left Arm, Patient Position: Sitting, Cuff Size: Normal)   Pulse 69   Ht 6\' 4"  (1.93 m)   Wt 228 lb (  103.4 kg)   BMI 27.75 kg/m  , BMI Body mass index is 27.75 kg/m. GEN: Well nourished, well developed, in no acute distress  HEENT: normal  Neck: no JVD, carotid bruits, or masses Cardiac: RRR; no murmurs, rubs, or gallops,no edema  Respiratory:  clear to auscultation bilaterally except for scant crackles at the bases, normal work of breathing GI: soft, nontender, nondistended, + BS MS: no deformity or atrophy  Skin: warm and dry, no rash Neuro:  Strength and sensation are intact Psych: euthymic mood, full affect    Recent Labs: 12/27/2015: ALT 13 01/14/2016: BUN 21; Creatinine, Ser 1.35; Potassium 3.9; Sodium 139 06/28/2016: Hemoglobin 10.0; Platelets 258.0    Lipid Panel Lab Results  Component Value Date   CHOL 110 06/28/2016   HDL 29.90 (L) 06/28/2016   LDLCALC 64 06/28/2016   TRIG 80.0 06/28/2016      Wt Readings from Last 3 Encounters:  07/18/16 228 lb (103.4 kg)  06/28/16 226 lb 6 oz (102.7 kg)  05/03/16 230 lb 8 oz (104.6 kg)       ASSESSMENT AND PLAN:  Atrial fibrillation, unspecified type (HCC) - Plan: EKG 12-Lead Currently  on anticoagulation for paroxysmal atrial fibrillation On Xarelto Complicated by guaiac-positive stools, anemia Scheduled to see GI in 2 weeks time  Recommended that we stay in touch concerning their findings  He does have history of external hemorrhoids .   SOB (shortness of breath) - Plan: EKG 12-Lead  shortness of breath details discussed with him. Less likely ischemia, more likely secondary to underlying anemia . Some scant crackles at the bases, possibly secondary to anemia, fluid retention. If symptoms get worse, recommended he call our office, may need Lasix to take as needed  Mixed hyperlipidemia Cholesterol is at goal on the current lipid regimen. No changes to the medications were made.  Coronary artery disease involving native coronary artery of native heart without angina pectoris Currently with no symptoms of angina. No further workup at this time. Continue current medication regimen. Certainly if shortness of breath, energy gets worse unrelated to anemia, stress testing would be performed   Other chronic pulmonary embolism without acute cor pulmonale (HCC)  on anticoagulation for history of PE   Chronic kidney disease (CKD), stage III (moderate)  Anemia, unspecified type  long discussion with him concerning affects of anemia  Scheduled to see GI in 2 weeks   Encounter for anticoagulation discussion and counseling  risk and benefit of anticoagulation discussed with him.   we'll closely monitor blood count, findings from GI to determine if he will remain a good candidate for anticoagulation   Total encounter time more than 25 minutes  Greater than 50% was spent in counseling and coordination of care with the patient     Disposition:   F/U  3 months   Orders Placed This Encounter  Procedures  . EKG 12-Lead     Signed, Esmond Plants, M.D., Ph.D. 07/18/2016  [place], [place]

## 2016-07-18 NOTE — Patient Instructions (Addendum)
Medication Instructions:   No medication changes made  If shortness of breath gets worse, call the office We might need lasix/furosemide (diuretic)  Labwork:  No new labs needed  Testing/Procedures:  No further testing at this time   I recommend watching educational videos on topics of interest to you at:       www.goemmi.com  Enter code: HEARTCARE    Follow-Up: It was a pleasure seeing you in the office today. Please call us if you have new issues that need to be addressed before your next appt.  [phone]  Your physician wants you to follow-up in: 3 months.  You will receive a reminder letter in the mail two months in advance. If you don't receive a letter, please call our office to schedule the follow-up appointment.  If you need a refill on your cardiac medications before your next appointment, please call your pharmacy.

## 2016-07-21 ENCOUNTER — Other Ambulatory Visit: Payer: Self-pay | Admitting: Cardiovascular Disease

## 2016-07-30 ENCOUNTER — Ambulatory Visit (INDEPENDENT_AMBULATORY_CARE_PROVIDER_SITE_OTHER): Payer: Medicare Other | Admitting: Gastroenterology

## 2016-07-30 ENCOUNTER — Other Ambulatory Visit: Payer: Self-pay

## 2016-07-30 ENCOUNTER — Encounter: Payer: Self-pay | Admitting: Gastroenterology

## 2016-07-30 VITALS — BP 114/65 | HR 69 | Temp 97.8°F | Ht 76.0 in | Wt 227.8 lb

## 2016-07-30 DIAGNOSIS — D508 Other iron deficiency anemias: Secondary | ICD-10-CM

## 2016-07-30 MED ORDER — FERROUS SULFATE 325 (65 FE) MG PO TABS: 325.0000 mg | ORAL_TABLET | Freq: Two times a day (BID) | ORAL | 2 refills | Status: DC

## 2016-07-30 NOTE — Progress Notes (Signed)
Gastroenterology Consultation  Referring Provider:     Coral Spikes, DO Primary Care Physician:  Coral Spikes, DO Primary Gastroenterologist:  Dr. [name]  Reason for Consultation:     Anemia        HPI:   William Taylor is a 82 y.o. y/o male referred for consultation & management  by Dr. Coral Spikes, DO.    He has a history of CAD,stent placment in 2005 , Atrial fibrillation 2011, On anticoagulation . At his recent visit to see his cardiologist Dr Rockey Situ , reported symptoms of shortness of breath, fatigue ,exertion . He was noted to have a drop in his Hb , stool was positive for blood . He is on xarelto. He has external hemorroids. 06/2016 Ferritin 9 , TIBC elevated. EGD last performed in 2015 was normal . A few diminutive polyps were excised on colonoscopy in 2015  Review of records does not indicate he had a small bowel capsule study .  Denies any overt blood loss. Normal bowel movements, no weight loss, no nausea vomiting or abdominal pain. Denies any NSAID use.    Component     Latest Ref Rng & Units 07/06/2015 01/14/2016 06/28/2016  Hemoglobin     13.0 - 17.0 g/dL 12.9 (L) 11.9 (L) 10.0 (L)    Past Medical History:  Diagnosis Date  . Cancer [facility])    Prostate, followed by Dr. Jacqlyn Larsen  . Colon polyp   . Coronary artery disease   . Hyperlipidemia   . Hypertension   . MI (myocardial infarction)    2015  . PAF (paroxysmal atrial fibrillation) ([place])    a. on xarelto  . Skin cancer     Past Surgical History:  Procedure Laterality Date  . CARDIAC CATHETERIZATION  10/2013   [facility]  . CATARACT EXTRACTION  Oct. 3, 2012   right eye  . colonoscopy    . COLONOSCOPY W/ POLYPECTOMY  2015   Dr Rayann Heman  . CORONARY ANGIOPLASTY  2009   2005; s/p stent  . HERNIA REPAIR  2013  . LUMBAR SPINE SURGERY    . UPPER GI ENDOSCOPY  Sept 2015   Dr Rayann Heman    Prior to Admission medications   Medication Sig Start Date End Date Taking? Authorizing Provider  cetirizine (ZYRTEC) 10 MG tablet  Take 1 tablet (10 mg total) by mouth daily. 05/03/16   Coral Spikes, DO  cyproheptadine (PERIACTIN) 4 MG tablet Take 1 tablet (4 mg total) by mouth 3 (three) times daily as needed for allergies. 05/18/15   [name], MD  diltiazem (CARTIA XT) 240 MG 24 hr capsule Take 1 capsule (240 mg total) by mouth daily. 04/02/16   Coral Spikes, DO  esomeprazole (NEXIUM) 40 MG capsule TAKE 1 CAPSULE DAILY AT 12 NOON 06/28/16   Coral Spikes, DO  fenofibrate micronized (ANTARA) 130 MG capsule TAKE 1 CAPSULE DAILY BEFORE BREAKFAST 12/25/15   [name], MD  finasteride (PROSCAR) 5 MG tablet Take 5 mg by mouth daily.      Historical Provider, MD  fluticasone (FLONASE) 50 MCG/ACT nasal spray Place 2 sprays into both nostrils daily. 05/03/16   Coral Spikes, DO  isosorbide mononitrate (IMDUR) 30 MG 24 hr tablet TAKE 1 TABLET DAILY 10/25/15   [name], MD  levobunolol (BETAGAN) 0.5 % ophthalmic solution 1 drop 2 (two) times daily.     Historical Provider, MD  lisinopril (PRINIVIL,ZESTRIL) 5 MG tablet TAKE 1 TABLET  DAILY 10/02/15   [name], MD  metoprolol (LOPRESSOR) 50 MG tablet TAKE ONE TABLET (50 MG) TWICE A DAY. 08/07/15   [name], MD  nitroGLYCERIN (NITROSTAT) 0.4 MG SL tablet DISSOLVE UNDER THE TONGUE 1 TABLET EVERY 5 MINUTES AS NEEDED FOR CHEST PAIN 06/11/16   [name], MD  Omega-3 Fatty Acids (FISH OIL) 1000 MG CAPS Take 1,000 mg by mouth daily.    Historical Provider, MD  pravastatin (PRAVACHOL) 40 MG tablet Take 1 tablet (40 mg total) by mouth at bedtime. 03/24/16   Burnard Hawthorne, FNP  Tamsulosin HCl (FLOMAX) 0.4 MG CAPS Take 0.4 mg by mouth daily.    Historical Provider, MD  XARELTO 20 MG TABS tablet TAKE 1 TABLET DAILY 07/22/16   [name], MD    Family History  Problem Relation Age of Onset  . Stroke Mother   . Colon cancer Father      Social History  Substance Use Topics  . Smoking status: Never Smoker  . Smokeless tobacco: Never Used  . Alcohol  use No    Allergies as of 07/30/2016 - Review Complete 07/18/2016  Allergen Reaction Noted  . Bee venom Swelling 07/17/2015  . Sulfa antibiotics Hives and Swelling 01/22/2016  . Warfarin and related Other (See Comments) 05/18/2012    Review of Systems:    All systems reviewed and negative except where noted in HPI.   Physical Exam:  There were no vitals taken for this visit. No LMP for male patient. Psych:  Alert and cooperative. Normal mood and affect. General:   Alert,  Well-developed, well-nourished, pleasant and cooperative in NAD Head:  Normocephalic and atraumatic. Eyes:  Sclera clear, no icterus.   Conjunctiva pink. Ears:  Normal auditory acuity. Nose:  No deformity, discharge, or lesions. Mouth:  No deformity or lesions,oropharynx pink & moist. Neck:  Supple; no masses or thyromegaly. Lungs:  Respirations even and unlabored.  Clear throughout to auscultation.   No wheezes, crackles, or rhonchi. No acute distress. Heart:  Regular rate and rhythm; no murmurs, clicks, rubs, or gallops. Abdomen:  Normal bowel sounds.  No bruits.  Soft, non-tender and non-distended without masses, hepatosplenomegaly or hernias noted.  No guarding or rebound tenderness.    Psych:  Alert and cooperative. Normal mood and affect. Imaging Studies: No results found.  Assessment and Plan:   William Taylor is a 82 y.o. y/o male has been referred for new onset iron deficiency anemia. He is on Xarelto . Prior evaluations have not included small bowel .   I will perform EGD+ colonoscopy and then if negative proceed with capsule study . I will commence him on oral iron today as his ferrtin is very low. If no better at next visit will need IV iron. Will obtain cardiac clearance prior to the procedures.   Follow up in 4 weeks.   Dr [name] MD

## 2016-08-05 ENCOUNTER — Other Ambulatory Visit: Payer: Self-pay | Admitting: Family Medicine

## 2016-08-05 DIAGNOSIS — I10 Essential (primary) hypertension: Secondary | ICD-10-CM

## 2016-08-05 MED ORDER — METOPROLOL TARTRATE 50 MG PO TABS
ORAL_TABLET | ORAL | 3 refills | Status: DC
Start: 2016-08-05 — End: 2016-10-14

## 2016-08-05 NOTE — Telephone Encounter (Signed)
Per PCP's last note pt is doing well on BP medications including metoprolol.

## 2016-08-05 NOTE — Progress Notes (Signed)
OK to stop xarelto for two days prior to procedure Would restart when GI doc feels appropriate.

## 2016-08-08 ENCOUNTER — Telehealth: Payer: Self-pay | Admitting: Cardiovascular Disease

## 2016-08-08 NOTE — Telephone Encounter (Signed)
Faxed request to hold xarelto prior to colonoscopy and EGD to [facility], [place], [phone]

## 2016-08-14 ENCOUNTER — Encounter: Payer: Self-pay | Admitting: *Deleted

## 2016-08-15 ENCOUNTER — Encounter: Payer: Self-pay | Admitting: *Deleted

## 2016-08-15 ENCOUNTER — Ambulatory Visit
Admission: RE | Admit: 2016-08-15 | Discharge: 2016-08-15 | Disposition: A | Discharge status: Home / Self Care | Payer: Medicare Other | Source: Ambulatory Visit | Attending: Gastroenterology | Admitting: Gastroenterology

## 2016-08-15 ENCOUNTER — Ambulatory Visit: Payer: Medicare Other | Admitting: Certified Registered Nurse Anesthetist

## 2016-08-15 ENCOUNTER — Encounter
Admission: RE | Disposition: A | Discharge status: Home / Self Care | Payer: Self-pay | Source: Ambulatory Visit | Attending: Gastroenterology

## 2016-08-15 DIAGNOSIS — Z91030 Bee allergy status: Secondary | ICD-10-CM | POA: Insufficient documentation

## 2016-08-15 DIAGNOSIS — Z882 Allergy status to sulfonamides status: Secondary | ICD-10-CM | POA: Insufficient documentation

## 2016-08-15 DIAGNOSIS — Z7901 Long term (current) use of anticoagulants: Secondary | ICD-10-CM | POA: Diagnosis not present

## 2016-08-15 DIAGNOSIS — Z8546 Personal history of malignant neoplasm of prostate: Secondary | ICD-10-CM | POA: Insufficient documentation

## 2016-08-15 DIAGNOSIS — I110 Hypertensive heart disease with heart failure: Secondary | ICD-10-CM | POA: Insufficient documentation

## 2016-08-15 DIAGNOSIS — Z85828 Personal history of other malignant neoplasm of skin: Secondary | ICD-10-CM | POA: Insufficient documentation

## 2016-08-15 DIAGNOSIS — K5730 Diverticulosis of large intestine without perforation or abscess without bleeding: Secondary | ICD-10-CM | POA: Diagnosis not present

## 2016-08-15 DIAGNOSIS — I509 Heart failure, unspecified: Secondary | ICD-10-CM | POA: Insufficient documentation

## 2016-08-15 DIAGNOSIS — K3189 Other diseases of stomach and duodenum: Secondary | ICD-10-CM | POA: Insufficient documentation

## 2016-08-15 DIAGNOSIS — K635 Polyp of colon: Secondary | ICD-10-CM | POA: Diagnosis not present

## 2016-08-15 DIAGNOSIS — K644 Residual hemorrhoidal skin tags: Secondary | ICD-10-CM | POA: Insufficient documentation

## 2016-08-15 DIAGNOSIS — I2510 Atherosclerotic heart disease of native coronary artery without angina pectoris: Secondary | ICD-10-CM | POA: Diagnosis not present

## 2016-08-15 DIAGNOSIS — D122 Benign neoplasm of ascending colon: Secondary | ICD-10-CM | POA: Diagnosis not present

## 2016-08-15 DIAGNOSIS — K5520 Angiodysplasia of colon without hemorrhage: Secondary | ICD-10-CM | POA: Diagnosis not present

## 2016-08-15 DIAGNOSIS — K317 Polyp of stomach and duodenum: Secondary | ICD-10-CM | POA: Insufficient documentation

## 2016-08-15 DIAGNOSIS — I252 Old myocardial infarction: Secondary | ICD-10-CM | POA: Diagnosis not present

## 2016-08-15 DIAGNOSIS — Z800 Family history of malignant neoplasm of digestive organs: Secondary | ICD-10-CM | POA: Insufficient documentation

## 2016-08-15 DIAGNOSIS — D509 Iron deficiency anemia, unspecified: Secondary | ICD-10-CM | POA: Insufficient documentation

## 2016-08-15 DIAGNOSIS — Z888 Allergy status to other drugs, medicaments and biological substances status: Secondary | ICD-10-CM | POA: Insufficient documentation

## 2016-08-15 DIAGNOSIS — Z86010 Personal history of colonic polyps: Secondary | ICD-10-CM | POA: Insufficient documentation

## 2016-08-15 DIAGNOSIS — E785 Hyperlipidemia, unspecified: Secondary | ICD-10-CM | POA: Insufficient documentation

## 2016-08-15 DIAGNOSIS — I480 Paroxysmal atrial fibrillation: Secondary | ICD-10-CM | POA: Diagnosis not present

## 2016-08-15 HISTORY — PX: ESOPHAGOGASTRODUODENOSCOPY (EGD) WITH PROPOFOL: SHX5813

## 2016-08-15 HISTORY — PX: COLONOSCOPY WITH PROPOFOL: SHX5780

## 2016-08-15 SURGERY — ESOPHAGOGASTRODUODENOSCOPY (EGD) WITH PROPOFOL
Anesthesia: General

## 2016-08-15 MED ORDER — SODIUM CHLORIDE 0.9 % IV SOLN
INTRAVENOUS | Status: DC
  Administered 2016-08-15: 10:00:00 via INTRAVENOUS

## 2016-08-15 MED ORDER — PHENYLEPHRINE HCL 10 MG/ML IJ SOLN
INTRAMUSCULAR | Status: DC | PRN
  Administered 2016-08-15 (×2): 40 ug via INTRAVENOUS

## 2016-08-15 MED ORDER — PROPOFOL 10 MG/ML IV BOLUS
INTRAVENOUS | Status: DC | PRN
  Administered 2016-08-15: 20 mg via INTRAVENOUS
  Administered 2016-08-15: 60 mg via INTRAVENOUS

## 2016-08-15 MED ORDER — PROPOFOL 500 MG/50ML IV EMUL
INTRAVENOUS | Status: DC | PRN
  Administered 2016-08-15: 140 ug/kg/min via INTRAVENOUS

## 2016-08-15 MED ORDER — LIDOCAINE HCL (CARDIAC) 20 MG/ML IV SOLN
INTRAVENOUS | Status: DC | PRN
  Administered 2016-08-15: 100 mg via INTRAVENOUS

## 2016-08-15 MED ORDER — PHENYLEPHRINE 40 MCG/ML (10ML) SYRINGE FOR IV PUSH (FOR BLOOD PRESSURE SUPPORT)
PREFILLED_SYRINGE | INTRAVENOUS | Status: AC
  Filled 2016-08-15: qty 10

## 2016-08-15 MED ORDER — PROPOFOL 500 MG/50ML IV EMUL
INTRAVENOUS | Status: AC
  Filled 2016-08-15: qty 50

## 2016-08-15 MED ORDER — LIDOCAINE 2% (20 MG/ML) 5 ML SYRINGE
INTRAMUSCULAR | Status: AC
  Filled 2016-08-15: qty 5

## 2016-08-15 NOTE — Anesthesia Postprocedure Evaluation (Signed)
Anesthesia Post Note  Patient: ZYAD GLESSNER  Procedure(s) Performed: Procedure(s) (LRB): ESOPHAGOGASTRODUODENOSCOPY (EGD) WITH PROPOFOL (N/A) COLONOSCOPY WITH PROPOFOL (N/A)  Patient location during evaluation: PACU Anesthesia Type: General Level of consciousness: awake and alert and oriented Pain management: pain level controlled Vital Signs Assessment: post-procedure vital signs reviewed and stable Respiratory status: spontaneous breathing Cardiovascular status: blood pressure returned to baseline Anesthetic complications: no     Last Vitals:  Vitals:   08/15/16 0900 08/15/16 1030  BP: 124/75 96/62  Pulse: 70 (!) 56  Resp: 18 18  Temp: 36.4 C 36.7 C    Last Pain:  Vitals:   08/15/16 1030  TempSrc: Tympanic  PainSc:                  Orville Mena

## 2016-08-15 NOTE — H&P (Signed)
William Bellows MD [address], [place], [place] [address] Phone: [phone] Fax : [phone]  Primary Care Physician:  William Spikes, DO Primary Gastroenterologist:  Dr. Jonathon Taylor   Pre-Procedure History & Physical: HPI:  William Taylor is a 82 y.o. male is here for an endoscopy and colonoscopy.   Past Medical History:  Diagnosis Date  . Cancer [facility])    Prostate, followed by Dr. Jacqlyn Larsen  . Colon polyp   . Coronary artery disease   . Hyperlipidemia   . Hypertension   . MI (myocardial infarction)    2015  . PAF (paroxysmal atrial fibrillation) ([place])    a. on xarelto  . Skin cancer     Past Surgical History:  Procedure Laterality Date  . CARDIAC CATHETERIZATION  10/2013   [facility]  . CATARACT EXTRACTION  Oct. 3, 2012   right eye  . colonoscopy    . COLONOSCOPY W/ POLYPECTOMY  2015   Dr Rayann Heman  . CORONARY ANGIOPLASTY  2009   2005; s/p stent  . HERNIA REPAIR  2013  . LUMBAR SPINE SURGERY    . UPPER GI ENDOSCOPY  Sept 2015   Dr Rayann Heman    Prior to Admission medications   Medication Sig Start Date End Date Taking? Authorizing Provider  metoprolol (LOPRESSOR) 50 MG tablet TAKE ONE TABLET (50 MG) TWICE A DAY. Patient taking differently: 25 mg 2 (two) times daily. TAKE ONE TABLET (50 MG) TWICE A DAY. 08/05/16  Yes William Spikes, DO  cetirizine (ZYRTEC) 10 MG tablet Take 1 tablet (10 mg total) by mouth daily. Patient not taking: Reported on 07/30/2016 05/03/16   William Spikes, DO  cyproheptadine (PERIACTIN) 4 MG tablet Take 1 tablet (4 mg total) by mouth 3 (three) times daily as needed for allergies. 05/18/15   William Confer, MD  diltiazem (CARTIA XT) 240 MG 24 hr capsule Take 1 capsule (240 mg total) by mouth daily. 04/02/16   William Spikes, DO  esomeprazole (NEXIUM) 40 MG capsule TAKE 1 CAPSULE DAILY AT 12 NOON 06/28/16   William Spikes, DO  fenofibrate micronized (ANTARA) 130 MG capsule TAKE 1 CAPSULE DAILY BEFORE BREAKFAST 12/25/15   William Confer, MD  ferrous sulfate 325  (65 FE) MG tablet Take 1 tablet (325 mg total) by mouth 2 (two) times daily with a meal. 07/30/16 09/30/16  William Bellows, MD  finasteride (PROSCAR) 5 MG tablet Take 5 mg by mouth daily.      Historical Provider, MD  fluticasone (FLONASE) 50 MCG/ACT nasal spray Place 2 sprays into both nostrils daily. Patient not taking: Reported on 07/30/2016 05/03/16   William Spikes, DO  isosorbide mononitrate (IMDUR) 30 MG 24 hr tablet TAKE 1 TABLET DAILY 10/25/15   William Merritts, MD  levobunolol (BETAGAN) 0.5 % ophthalmic solution 1 drop 2 (two) times daily.     Historical Provider, MD  lisinopril (PRINIVIL,ZESTRIL) 5 MG tablet TAKE 1 TABLET DAILY 10/02/15   William Merritts, MD  nitroGLYCERIN (NITROSTAT) 0.4 MG SL tablet DISSOLVE UNDER THE TONGUE 1 TABLET EVERY 5 MINUTES AS NEEDED FOR CHEST PAIN 06/11/16   William Merritts, MD  Omega-3 Fatty Acids (FISH OIL) 1000 MG CAPS Take 1,000 mg by mouth daily.    Historical Provider, MD  pravastatin (PRAVACHOL) 40 MG tablet Take 1 tablet (40 mg total) by mouth at bedtime. 03/24/16   William Hawthorne, FNP  Tamsulosin HCl (FLOMAX) 0.4 MG CAPS Take 0.4 mg by mouth daily.  Historical Provider, MD  XARELTO 20 MG TABS tablet TAKE 1 TABLET DAILY 07/22/16   William Merritts, MD    Allergies as of 07/30/2016 - Review Complete 07/30/2016  Allergen Reaction Noted  . Bee venom Swelling 07/17/2015  . Sulfa antibiotics Hives and Swelling 01/22/2016  . Warfarin and related Other (See Comments) 05/18/2012    Family History  Problem Relation Age of Onset  . Stroke Mother   . Colon cancer Father     Social History   Social History  . Marital status: Married    Spouse name: William Taylor  . Number of children: William Taylor  . Years of education: William Taylor   Occupational History  . Not on file.   Social History Main Topics  . Smoking status: Never Smoker  . Smokeless tobacco: Never Used  . Alcohol use No  . Drug use: No  . Sexual activity: Not on file   Other Topics Concern  . Not on file    Social History Narrative  . No narrative on file    Review of Systems: See HPI, otherwise negative ROS  Physical Exam: BP 124/75   Pulse 70   Temp 97.5 F (36.4 C) (Oral)   Resp 18   Ht 6\' 4"  (1.93 m)   Wt 214 lb (97.1 kg)   SpO2 99%   BMI 26.05 kg/m  General:   Alert,  pleasant and cooperative in NAD Head:  Normocephalic and atraumatic. Neck:  Supple; no masses or thyromegaly. Lungs:  Clear throughout to auscultation.    Heart:  Regular rate and rhythm. Abdomen:  Soft, nontender and nondistended. Normal bowel sounds, without guarding, and without rebound.   Neurologic:  Alert and  oriented x4;  grossly normal neurologically.  Impression/Plan: William Taylor is here for an endoscopy and colonoscopy to be performed for iron deficiency anemia  Risks, benefits, limitations, and alternatives regarding  endoscopy and colonoscopy have been reviewed with the patient.  Questions have been answered.  All parties agreeable.   William Bellows, MD  08/15/2016, 9:23 AM

## 2016-08-15 NOTE — Transfer of Care (Signed)
Immediate Anesthesia Transfer of Care Note  Patient: William Taylor  Procedure(s) Performed: Procedure(s): ESOPHAGOGASTRODUODENOSCOPY (EGD) WITH PROPOFOL (N/A) COLONOSCOPY WITH PROPOFOL (N/A)  Patient Location: PACU  Anesthesia Type:General  Level of Consciousness: sedated  Airway & Oxygen Therapy: Pt spontaneously breathing and connected to nasal cannula  Post-op Assessment: Report given to RN and Post -op Vital signs reviewed and stable  Post vital signs: Reviewed and stable  Last Vitals:  Vitals:   08/15/16 0900 08/15/16 1030  BP: 124/75 96/62  Pulse: 70 (!) 56  Resp: 18 18  Temp: 36.4 C 36.7 C    Last Pain:  Vitals:   08/15/16 1030  TempSrc: Tympanic  PainSc:          Complications: No apparent anesthesia complications

## 2016-08-15 NOTE — Anesthesia Procedure Notes (Signed)
Performed by: Geet Hosking Pre-anesthesia Checklist: Patient identified, Emergency Drugs available, Suction available, Patient being monitored and Timeout performed Patient Re-evaluated:Patient Re-evaluated prior to inductionOxygen Delivery Method: Nasal cannula Intubation Type: IV induction       

## 2016-08-15 NOTE — Op Note (Signed)
[facility] Gastroenterology Patient Name: William Taylor Procedure Date: 08/15/2016 9:29 AM MRN: [id number] Account #: [id number] Date of Birth: [birth year] Admit Type: Outpatient Age: 82 Room: [facility] ENDO ROOM 4 Gender: Male Note Status: Finalized Procedure:            Upper GI endoscopy Indications:          Iron deficiency anemia Providers:            [name] MD, MD Referring MD:         Barnie Del. Lacinda Axon (Referring MD) Medicines:            Monitored Anesthesia Care Complications:        No immediate complications. Procedure:            Pre-Anesthesia Assessment:                       - Prior to the procedure, a History and Physical was                        performed, and patient medications, allergies and                        sensitivities were reviewed. The patient's tolerance of                        previous anesthesia was reviewed.                       - The risks and benefits of the procedure and the                        sedation options and risks were discussed with the                        patient. All questions were answered and informed                        consent was obtained.                       - The risks and benefits of the procedure and the                        sedation options and risks were discussed with the                        patient. All questions were answered and informed                        consent was obtained.                       - ASA Grade Assessment: III - A patient with severe                        systemic disease.                       After obtaining informed consent, the endoscope was  passed under direct vision. Throughout the procedure,                        the patient's blood pressure, pulse, and oxygen                        saturations were monitored continuously. The Endoscope                        was introduced through the mouth, and advanced to the       third part of duodenum. The upper GI endoscopy was                        accomplished with ease. The patient tolerated the                        procedure well. Findings:      The examined duodenum was normal.      The esophagus was normal.      One 10 mm semi-sessile polyp with no bleeding and stigmata of recent       bleeding was found on the greater curvature of the stomach. The polyp       was removed with a cold snare. Resection and retrieval were complete. To       prevent bleeding after the polypectomy, one hemostatic clip was       successfully placed. There was no bleeding at the end of the procedure.       I intended to hot snare the polyp but was unintentionally cold snared.      The examined duodenum was normal. Biopsies for histology were taken with       a cold forceps for evaluation of celiac disease. Impression:           - Normal examined duodenum.                       - Normal esophagus.                       - One gastric polyp. Resected and retrieved. Clip was                        placed.                       - Normal examined duodenum. Biopsied. Recommendation:       - Patient has a contact number available for                        emergencies. The signs and symptoms of potential                        delayed complications were discussed with the patient.                        Return to normal activities tomorrow. Written discharge                        instructions were provided to the patient.                       -  Resume previous diet today.                       - Continue present medications.                       - Resume Xarelto (rivaroxaban) at prior dose today.                       - Discharge patient to home (with escort).                       - Return to my office as previously scheduled. Procedure Code(s):    --- Professional ---                       [phone], Esophagogastroduodenoscopy, flexible, transoral;                        with  removal of tumor(s), polyp(s), or other lesion(s)                        by snare technique Diagnosis Code(s):    --- Professional ---                       K31.7, Polyp of stomach and duodenum                       D50.9, Iron deficiency anemia, unspecified CPT copyright 2016 American Medical Association. All rights reserved. The codes documented in this report are preliminary and upon coder review may  be revised to meet current compliance requirements. [name], MD [name] MD, MD 08/15/2016 10:00:29 AM This report has been signed electronically. Number of Addenda: 0 Note Initiated On: 08/15/2016 9:29 AM      [facility]

## 2016-08-15 NOTE — Anesthesia Preprocedure Evaluation (Signed)
Anesthesia Evaluation  Patient identified by MRN, date of birth, ID band Patient awake    Reviewed: Allergy & Precautions, NPO status , Patient's Chart, lab work & pertinent test results, reviewed documented beta blocker date and time   Airway Mallampati: II  TM Distance: >3 FB     Dental  (+) Partial Upper, Partial Lower, Chipped   Pulmonary neg pulmonary ROS,    Pulmonary exam normal        Cardiovascular hypertension, Pt. on medications and Pt. on home beta blockers + CAD, + Past MI and +CHF  Normal cardiovascular exam     Neuro/Psych negative neurological ROS  negative psych ROS   GI/Hepatic Neg liver ROS, Colon polyp   Endo/Other  negative endocrine ROS  Renal/GU Renal InsufficiencyRenal disease  negative genitourinary   Musculoskeletal negative musculoskeletal ROS (+)   Abdominal Normal abdominal exam  (+)   Peds negative pediatric ROS (+)  Hematology  (+) anemia ,   Anesthesia Other Findings   Reproductive/Obstetrics                             Anesthesia Physical Anesthesia Plan  ASA: III  Anesthesia Plan: General   Post-op Pain Management:    Induction: Intravenous  Airway Management Planned: Nasal Cannula  Additional Equipment:   Intra-op Plan:   Post-operative Plan:   Informed Consent: I have reviewed the patients History and Physical, chart, labs and discussed the procedure including the risks, benefits and alternatives for the proposed anesthesia with the patient or authorized representative who has indicated his/her understanding and acceptance.   Dental advisory given  Plan Discussed with: CRNA and Surgeon  Anesthesia Plan Comments:         Anesthesia Quick Evaluation

## 2016-08-15 NOTE — Op Note (Signed)
[facility] Gastroenterology Patient Name: William Taylor Procedure Date: 08/15/2016 9:28 AM MRN: [id number] Account #: [id number] Date of Birth: [birth year] Admit Type: Outpatient Age: 82 Room: [facility] ENDO ROOM 4 Gender: Male Note Status: Finalized Procedure:            Colonoscopy Indications:          Iron deficiency anemia Providers:            [name] MD, MD Referring MD:         Barnie Del. Lacinda Axon (Referring MD) Medicines:            Monitored Anesthesia Care Complications:        No immediate complications. Procedure:            Pre-Anesthesia Assessment:                       - Prior to the procedure, a History and Physical was                        performed, and patient medications, allergies and                        sensitivities were reviewed. The patient's tolerance of                        previous anesthesia was reviewed.                       - The risks and benefits of the procedure and the                        sedation options and risks were discussed with the                        patient. All questions were answered and informed                        consent was obtained.                       - The risks and benefits of the procedure and the                        sedation options and risks were discussed with the                        patient. All questions were answered and informed                        consent was obtained.                       - The risks and benefits of the procedure and the                        sedation options and risks were discussed with the                        patient. All questions were answered and informed  consent was obtained.                       - ASA Grade Assessment: III - A patient with severe                        systemic disease.                       After obtaining informed consent, the colonoscope was                        passed under direct vision. Throughout  the procedure,                        the patient's blood pressure, pulse, and oxygen                        saturations were monitored continuously. The                        Colonoscope was introduced through the anus and                        advanced to the the cecum, identified by the                        appendiceal orifice, IC valve and transillumination.                        The colonoscopy was performed with ease. The patient                        tolerated the procedure well. The quality of the bowel                        preparation was excellent. Findings:      The digital rectal exam findings include non-thrombosed external       hemorrhoids.      Multiple small and large-mouthed diverticula were found in the entire       colon.      A single medium-sized localized angioectasia without bleeding was found       in the cecum. Argon photo coagulation performed for bleeding prevention       using argon beam at 0.5 liters/minute and 30 watts was successful.      A 5 mm polyp was found in the ascending colon. The polyp was sessile.       The polyp was removed with a cold biopsy forceps. Resection and       retrieval were complete.      A 5 mm polyp was found in the ascending colon. The polyp was sessile.       The polyp was removed with a cold biopsy forceps. Resection and       retrieval were complete.      The exam was otherwise without abnormality on direct and retroflexion       views. Impression:           - Non-thrombosed external hemorrhoids found on digital  rectal exam.                       - Diverticulosis in the entire examined colon.                       - A single non-bleeding colonic angioectasia. Treated                        with argon beam coagulation.                       - One 5 mm polyp in the ascending colon, removed with a                        cold biopsy forceps. Resected and retrieved.                       - One 5 mm  polyp in the ascending colon, removed with a                        cold biopsy forceps. Resected and retrieved.                       - The examination was otherwise normal on direct and                        retroflexion views. Recommendation:       - Patient has a contact number available for                        emergencies. The signs and symptoms of potential                        delayed complications were discussed with the patient.                        Return to normal activities tomorrow. Written discharge                        instructions were provided to the patient.                       - Resume previous diet.                       - Continue present medications.                       - Resume Xarelto (rivaroxaban) at prior dose today.                       - Await pathology results.                       - Discharge patient to home (with escort).                       - Return to my office as previously scheduled. Procedure Code(s):    --- Professional ---  [id number], 76, Colonoscopy, flexible; with control of                        bleeding, any method                       45380, Colonoscopy, flexible; with biopsy, single or                        multiple Diagnosis Code(s):    --- Professional ---                       K64.4, Residual hemorrhoidal skin tags                       K55.20, Angiodysplasia of colon without hemorrhage                       D12.2, Benign neoplasm of ascending colon                       D50.9, Iron deficiency anemia, unspecified                       K57.30, Diverticulosis of large intestine without                        perforation or abscess without bleeding CPT copyright 2016 American Medical Association. All rights reserved. The codes documented in this report are preliminary and upon coder review may  be revised to meet current compliance requirements. [name], MD [name] MD, MD 08/15/2016 10:37:58  AM This report has been signed electronically. Number of Addenda: 0 Note Initiated On: 08/15/2016 9:28 AM Scope Withdrawal Time: 0 hours 6 minutes 34 seconds  Total Procedure Duration: 0 hours 29 minutes 19 seconds       [facility]

## 2016-08-16 ENCOUNTER — Encounter: Payer: Self-pay | Admitting: Gastroenterology

## 2016-08-16 LAB — SURGICAL PATHOLOGY

## 2016-08-23 ENCOUNTER — Telehealth: Payer: Self-pay

## 2016-08-23 NOTE — Telephone Encounter (Signed)
-----   Message from [name], MD sent at 08/20/2016  9:55 AM EST ----- Tubular adenoma resected- no further colonoscopy in view of age, gastric polyp was benign. Follow up as scheduled to discuss next steps

## 2016-08-23 NOTE — Telephone Encounter (Signed)
LVM for pt to return my call.

## 2016-08-23 NOTE — Telephone Encounter (Signed)
Pt notified of colon results.  

## 2016-09-11 ENCOUNTER — Other Ambulatory Visit: Payer: Self-pay | Admitting: Family Medicine

## 2016-09-11 ENCOUNTER — Ambulatory Visit (INDEPENDENT_AMBULATORY_CARE_PROVIDER_SITE_OTHER): Payer: Medicare Other | Admitting: Gastroenterology

## 2016-09-11 ENCOUNTER — Telehealth: Payer: Self-pay

## 2016-09-11 ENCOUNTER — Other Ambulatory Visit
Admission: RE | Admit: 2016-09-11 | Discharge: 2016-09-11 | Disposition: A | Discharge status: Home / Self Care | Payer: Medicare Other | Source: Ambulatory Visit | Attending: Gastroenterology | Admitting: Gastroenterology

## 2016-09-11 ENCOUNTER — Encounter: Payer: Self-pay | Admitting: Gastroenterology

## 2016-09-11 VITALS — BP 110/78 | HR 61 | Ht 76.0 in | Wt 226.0 lb

## 2016-09-11 DIAGNOSIS — D509 Iron deficiency anemia, unspecified: Secondary | ICD-10-CM | POA: Diagnosis not present

## 2016-09-11 DIAGNOSIS — Z760 Encounter for issue of repeat prescription: Secondary | ICD-10-CM

## 2016-09-11 DIAGNOSIS — K5730 Diverticulosis of large intestine without perforation or abscess without bleeding: Secondary | ICD-10-CM

## 2016-09-11 LAB — CBC WITH DIFFERENTIAL/PLATELET
BASOS ABS: 0 10*3/uL (ref 0–0.1)
Basophils Relative: 1 %
Eosinophils Absolute: 0.1 10*3/uL (ref 0–0.7)
Eosinophils Relative: 1 %
HEMATOCRIT: 34 % — AB (ref 40.0–52.0)
HEMOGLOBIN: 11.9 g/dL — AB (ref 13.0–18.0)
LYMPHS PCT: 26 %
Lymphs Abs: 1.8 10*3/uL (ref 1.0–3.6)
MCH: 33.4 pg (ref 26.0–34.0)
MCHC: 35 g/dL (ref 32.0–36.0)
MCV: 95.3 fL (ref 80.0–100.0)
MONO ABS: 0.6 10*3/uL (ref 0.2–1.0)
Monocytes Relative: 9 %
NEUTROS ABS: 4.4 10*3/uL (ref 1.4–6.5)
Neutrophils Relative %: 63 %
Platelets: 216 10*3/uL (ref 150–440)
RBC: 3.57 MIL/uL — ABNORMAL LOW (ref 4.40–5.90)
RDW: 20.9 % — AB (ref 11.5–14.5)
WBC: 7 10*3/uL (ref 3.8–10.6)

## 2016-09-11 MED ORDER — PRAVASTATIN SODIUM 40 MG PO TABS: 40.0000 mg | ORAL_TABLET | Freq: Every day | ORAL | 3 refills | Status: DC

## 2016-09-11 NOTE — Progress Notes (Signed)
Primary Care Physician: Coral Spikes, DO  Primary Gastroenterologist:  Dr. [name]   Chief Complaint  Patient presents with  . Follow-up    EGD    HPI: He is here today for follow up . He was seen last on 07/30/17 when he was referred for iron deficiency anemia.  Summary of history : He has a history of CAD,stent placment in 2005 , Atrial fibrillation 2011, On anticoagulation . At his recent visit to see his cardiologist Dr Rockey Situ , reported symptoms of shortness of breath, fatigue ,exertion . He was noted to have a drop in his Hb , stool was positive for blood . He is on xarelto. He has external hemorroids. 06/2016 Ferritin 9 , TIBC elevated.   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   Taking his oral iron , no bleeding seen , no other complaints.  Past Surgical History:  Procedure Laterality Date  . CARDIAC CATHETERIZATION  10/2013   [facility]  . CATARACT EXTRACTION  Oct. 3, 2012   right eye  . colonoscopy    . COLONOSCOPY W/ POLYPECTOMY  2015   Dr Rayann Heman  . COLONOSCOPY WITH PROPOFOL N/A 08/15/2016   Procedure: COLONOSCOPY WITH PROPOFOL;  Surgeon: [name], MD;  Location: [facility] ENDOSCOPY;  Service: Endoscopy;  Laterality: N/A;  . CORONARY ANGIOPLASTY  2009   2005; s/p stent  . ESOPHAGOGASTRODUODENOSCOPY (EGD) WITH PROPOFOL N/A 08/15/2016   Procedure: ESOPHAGOGASTRODUODENOSCOPY (EGD) WITH PROPOFOL;  Surgeon: [name], MD;  Location: [facility] ENDOSCOPY;  Service: Endoscopy;  Laterality: N/A;  . HERNIA REPAIR  2013  . LUMBAR SPINE SURGERY    . UPPER GI ENDOSCOPY  Sept 2015   Dr Rayann Heman    Current Outpatient Prescriptions  Medication Sig Dispense Refill  . cetirizine (ZYRTEC) 10 MG tablet Take 1 tablet (10 mg total) by mouth daily. 30 tablet 0  .  cyproheptadine (PERIACTIN) 4 MG tablet Take 1 tablet (4 mg total) by mouth 3 (three) times daily as needed for allergies. 90 tablet 3  . diltiazem (CARTIA XT) 240 MG 24 hr capsule Take 1 capsule (240 mg total) by mouth daily. 90 capsule 2  . esomeprazole (NEXIUM) 40 MG capsule TAKE 1 CAPSULE DAILY AT 12 NOON 90 capsule 3  . fenofibrate micronized (ANTARA) 130 MG capsule TAKE 1 CAPSULE DAILY BEFORE BREAKFAST 90 capsule 2  . ferrous sulfate 325 (65 FE) MG tablet Take 1 tablet (325 mg total) by mouth 2 (two) times daily with a meal. 124 tablet 2  . finasteride (PROSCAR) 5 MG tablet Take 5 mg by mouth daily.      . fluticasone (FLONASE) 50 MCG/ACT nasal spray Place 2 sprays into both nostrils daily. 16 g 6  . isosorbide mononitrate (IMDUR) 30 MG 24 hr tablet TAKE 1 TABLET DAILY 90 tablet 3  . levobunolol (BETAGAN) 0.5 % ophthalmic solution 1 drop 2 (two) times daily.     Marland Kitchen lisinopril (PRINIVIL,ZESTRIL) 5 MG tablet TAKE 1 TABLET DAILY 90 tablet 3  . metoprolol (LOPRESSOR) 50 MG tablet TAKE ONE TABLET (50 MG) TWICE A DAY. (Patient taking differently: 25 mg 2 (two) times daily. TAKE ONE TABLET (50 MG) TWICE A DAY.) 180 tablet 3  . nitroGLYCERIN (NITROSTAT) 0.4 MG SL tablet DISSOLVE UNDER THE TONGUE 1 TABLET EVERY 5 MINUTES AS NEEDED FOR CHEST  PAIN 25 tablet 3  . Omega-3 Fatty Acids (FISH OIL) 1000 MG CAPS Take 1,000 mg by mouth daily.    . pravastatin (PRAVACHOL) 40 MG tablet Take 1 tablet (40 mg total) by mouth at bedtime. 90 tablet 3  . Tamsulosin HCl (FLOMAX) 0.4 MG CAPS Take 0.4 mg by mouth daily.    Alveda Reasons 20 MG TABS tablet TAKE 1 TABLET DAILY 90 tablet 3   No current facility-administered medications for this visit.     Allergies as of 09/11/2016 - Review Complete 09/11/2016  Allergen Reaction Noted  . Bee venom Swelling 07/17/2015  . Sulfa antibiotics Hives and Swelling 01/22/2016  . Warfarin and related Other (See Comments) 05/18/2012    ROS:  General: Negative for anorexia, weight  loss, fever, chills, fatigue, weakness. ENT: Negative for hoarseness, difficulty swallowing , nasal congestion. CV: Negative for chest pain, angina, palpitations, dyspnea on exertion, peripheral edema.  Respiratory: Negative for dyspnea at rest, dyspnea on exertion, cough, sputum, wheezing.  GI: See history of present illness. GU:  Negative for dysuria, hematuria, urinary incontinence, urinary frequency, nocturnal urination.  Endo: Negative for unusual weight change.    Physical Examination:   BP 110/78   Pulse 61   Ht 6\' 4"  (1.93 m)   Wt 226 lb (102.5 kg)   BMI 27.51 kg/m   General: Well-nourished, well-developed in no acute distress.  Eyes: No icterus. Conjunctivae pink. Mouth: Oropharyngeal mucosa moist and pink , no lesions erythema or exudate. Lungs: Clear to auscultation bilaterally. Non-labored. Heart: Regular rate and rhythm, no murmurs rubs or gallops.  Abdomen: Bowel sounds are normal, nontender, nondistended, no hepatosplenomegaly or masses, no abdominal bruits or hernia , no rebound or guarding.   Extremities: No lower extremity edema. No clubbing or deformities. Neuro: Alert and oriented x 3.  Grossly intact. Skin: Warm and dry, no jaundice.   Psych: Alert and cooperative, normal mood and affect.    Imaging Studies: No results found.  Assessment and Plan:   William Taylor is a 83 y.o. y/o male here for follow up for iron deficiency anemia. He is on Xarelto . I commenced him on oral iron in 07/2016.  I will proceed with small bowel capsule study to r/o any small bowel AVM's. I will also recheck his CBC and iron studies- if not improving then will need IV iron. I explained the risks of a capsule study incuding capsule retention and need for surgery .   Dr [name]  MD F/u in 8 weeks

## 2016-09-11 NOTE — Telephone Encounter (Signed)
Pt notified of results during follow up appt today with Dr. [name].

## 2016-09-11 NOTE — Telephone Encounter (Signed)
-----   Message from [name], MD sent at 09/11/2016  2:39 PM EST ----- Hemoglobin has improved- continue to take oral iron as heis presently

## 2016-09-18 ENCOUNTER — Telehealth: Payer: Self-pay | Admitting: Gastroenterology

## 2016-09-18 NOTE — Telephone Encounter (Signed)
Patients wife called and stated that patient needs an appointment for a  capsule endo. Please call to set up appointment

## 2016-09-19 ENCOUNTER — Encounter: Payer: Self-pay | Admitting: Family Medicine

## 2016-09-19 ENCOUNTER — Ambulatory Visit (INDEPENDENT_AMBULATORY_CARE_PROVIDER_SITE_OTHER): Payer: Medicare Other | Admitting: Family Medicine

## 2016-09-19 DIAGNOSIS — J029 Acute pharyngitis, unspecified: Secondary | ICD-10-CM | POA: Diagnosis not present

## 2016-09-19 MED ORDER — AMOXICILLIN-POT CLAVULANATE 875-125 MG PO TABS: 1.0000 | ORAL_TABLET | Freq: Two times a day (BID) | ORAL | 0 refills | Status: DC

## 2016-09-19 NOTE — Progress Notes (Signed)
Subjective:  Patient ID: William Taylor, male    DOB: 02-19-1930  Age: 83 y.o. MRN: [id number]  CC: Sore throat, fatigue  HPI:  83 year old male with CAD, A. fib, CKD, hypertension, hyperlipidemia, recent IDA, hx of prostate cancer presents with the above complaint.  Patient states he's not been feeling well since Friday. He's had ongoing sore throat. He also reports associated fatigue. He states he has little energy. No associated fevers or chills. No shortness of breath. No chest pain. He has recently had a colonoscopy and endoscopy. His anemia is improving. He has been gargling with salt water without improvement in his sore throat. No other medications or interventions tried. No known exacerbating factors. Also, he states that he feels like he is now developing sinus congestion.  Social Hx   Social History   Social History  . Marital status: Married    Spouse name: N/A  . Number of children: N/A  . Years of education: N/A   Social History Main Topics  . Smoking status: Never Smoker  . Smokeless tobacco: Never Used  . Alcohol use No  . Drug use: No  . Sexual activity: Not Asked   Other Topics Concern  . None   Social History Narrative  . None    Review of Systems  Constitutional: Positive for fatigue.  HENT: Positive for sore throat.   Respiratory: Negative.   Cardiovascular: Negative.    Objective:  BP 127/80   Pulse 67   Temp 97.5 F (36.4 C) (Oral)   Wt 222 lb 12.8 oz (101.1 kg)   SpO2 97%   BMI 27.12 kg/m   BP/Weight 09/19/2016 09/11/2016 [id number]  Systolic BP [id number]  Diastolic BP 80 78 96  Wt. (Lbs) 222.8 226 214  BMI 27.12 27.51 26.05   Physical Exam  Constitutional: He is oriented to person, place, and time. He appears well-developed. No distress.  HENT:  Mouth/Throat: Oropharynx is clear and moist.  Cardiovascular: Normal rate and regular rhythm.   Pulmonary/Chest: Effort normal and breath sounds normal.  Neurological: He is alert  and oriented to person, place, and time.  Psychiatric: He has a normal mood and affect.  Vitals reviewed.  Lab Results  Component Value Date   WBC 7.0 09/11/2016   HGB 11.9 (L) 09/11/2016   HCT 34.0 (L) 09/11/2016   PLT 216 09/11/2016   GLUCOSE 144 (H) 01/14/2016   CHOL 110 06/28/2016   TRIG 80.0 06/28/2016   HDL 29.90 (L) 06/28/2016   LDLCALC 64 06/28/2016   ALT 13 12/27/2015   AST 18 12/27/2015   NA 139 01/14/2016   K 3.9 01/14/2016   CL 107 01/14/2016   CREATININE 1.35 (H) 01/14/2016   BUN 21 (H) 01/14/2016   CO2 25 01/14/2016   INR 2.43 01/14/2016   HGBA1C 6.5 06/28/2016    Assessment & Plan:   Problem List Items Addressed This Visit    Acute pharyngitis    New acute problem. Appears fatigued (which is not normal for him). Likely ongoing viral illness which is contributing to ST and fatigue. Advised supportive care. Given new congestion and persistence of symptoms, I did give him an antibiotic to be filled if he does not improve or worsens.        Meds ordered this encounter  Medications  . amoxicillin-clavulanate (AUGMENTIN) 875-125 MG tablet    Sig: Take 1 tablet by mouth 2 (two) times daily.    Dispense:  14 tablet  Refill:  0    Follow-up: PRN  Hasty

## 2016-09-19 NOTE — Patient Instructions (Signed)
This is likely viral.  If you fail to improve, fill the antibiotic.  Take care  Dr. Lacinda Axon

## 2016-09-19 NOTE — Assessment & Plan Note (Signed)
New acute problem. Appears fatigued (which is not normal for him). Likely ongoing viral illness which is contributing to ST and fatigue. Advised supportive care. Given new congestion and persistence of symptoms, I did give him an antibiotic to be filled if he does not improve or worsens.

## 2016-09-20 ENCOUNTER — Other Ambulatory Visit: Payer: Self-pay

## 2016-09-20 NOTE — Telephone Encounter (Signed)
Patient had an appointment with Dr. [name] last week and needs to be scheduled for a capsule study. He said he hasn't heard anything yet. Please call patient and advice.

## 2016-09-20 NOTE — Telephone Encounter (Signed)
Advised pt's wife scheduler is not available today to set up, but I will call her first thing Monday morning to set up.

## 2016-09-23 LAB — POCT RAPID STREP A (OFFICE): Rapid Strep A Screen: NEGATIVE

## 2016-09-23 NOTE — Addendum Note (Signed)
Addended by: Carmin Muskrat on: 09/23/2016 10:14 AM   Modules accepted: Orders

## 2016-09-23 NOTE — Telephone Encounter (Signed)
Pt scheduled for a capsule endoscopy at [facility] on 09/26/16. Capsule preparation form was emailed to pt. Pt will review and call me back with any questions.

## 2016-09-26 ENCOUNTER — Ambulatory Visit
Admission: RE | Admit: 2016-09-26 | Discharge: 2016-09-26 | Disposition: A | Discharge status: Home / Self Care | Payer: Medicare Other | Source: Ambulatory Visit | Attending: Gastroenterology | Admitting: Gastroenterology

## 2016-09-26 ENCOUNTER — Encounter
Admission: RE | Disposition: A | Discharge status: Home / Self Care | Payer: Self-pay | Source: Ambulatory Visit | Attending: Gastroenterology

## 2016-09-26 DIAGNOSIS — I2510 Atherosclerotic heart disease of native coronary artery without angina pectoris: Secondary | ICD-10-CM | POA: Diagnosis not present

## 2016-09-26 DIAGNOSIS — Z7951 Long term (current) use of inhaled steroids: Secondary | ICD-10-CM | POA: Diagnosis not present

## 2016-09-26 DIAGNOSIS — I480 Paroxysmal atrial fibrillation: Secondary | ICD-10-CM | POA: Insufficient documentation

## 2016-09-26 DIAGNOSIS — D509 Iron deficiency anemia, unspecified: Secondary | ICD-10-CM | POA: Diagnosis not present

## 2016-09-26 DIAGNOSIS — Z955 Presence of coronary angioplasty implant and graft: Secondary | ICD-10-CM | POA: Insufficient documentation

## 2016-09-26 DIAGNOSIS — Z79899 Other long term (current) drug therapy: Secondary | ICD-10-CM | POA: Diagnosis not present

## 2016-09-26 DIAGNOSIS — Z7901 Long term (current) use of anticoagulants: Secondary | ICD-10-CM | POA: Insufficient documentation

## 2016-09-26 DIAGNOSIS — I10 Essential (primary) hypertension: Secondary | ICD-10-CM | POA: Diagnosis not present

## 2016-09-26 DIAGNOSIS — I252 Old myocardial infarction: Secondary | ICD-10-CM | POA: Diagnosis not present

## 2016-09-26 HISTORY — PX: GIVENS CAPSULE STUDY: SHX5432

## 2016-09-26 SURGERY — IMAGING PROCEDURE, GI TRACT, INTRALUMINAL, VIA CAPSULE

## 2016-09-27 ENCOUNTER — Encounter: Payer: Self-pay | Admitting: Gastroenterology

## 2016-09-27 DIAGNOSIS — D509 Iron deficiency anemia, unspecified: Secondary | ICD-10-CM | POA: Diagnosis not present

## 2016-09-28 ENCOUNTER — Other Ambulatory Visit: Payer: Self-pay | Admitting: Cardiovascular Disease

## 2016-09-30 NOTE — H&P (Signed)
[name] MD [address], [place] [place] [address] Phone: [phone] Fax : [phone]  Primary Care Physician:  Coral Spikes, DO Primary Gastroenterologist:  Dr. [name]   Pre-Procedure History & Physical: HPI:  William Taylor is a 83 y.o. male is here for an capsule study.   Past Medical History:  Diagnosis Date  . Cancer [facility])    Prostate, followed by Dr. Jacqlyn Larsen  . Colon polyp   . Coronary artery disease   . Hyperlipidemia   . Hypertension   . MI (myocardial infarction)    2015  . PAF (paroxysmal atrial fibrillation) ([place])    a. on xarelto  . Skin cancer     Past Surgical History:  Procedure Laterality Date  . CARDIAC CATHETERIZATION  10/2013   [facility]  . CATARACT EXTRACTION  Oct. 3, 2012   right eye  . colonoscopy    . COLONOSCOPY W/ POLYPECTOMY  2015   Dr Rayann Heman  . COLONOSCOPY WITH PROPOFOL N/A 08/15/2016   Procedure: COLONOSCOPY WITH PROPOFOL;  Surgeon: [name], MD;  Location: [facility] ENDOSCOPY;  Service: Endoscopy;  Laterality: N/A;  . CORONARY ANGIOPLASTY  2009   2005; s/p stent  . ESOPHAGOGASTRODUODENOSCOPY (EGD) WITH PROPOFOL N/A 08/15/2016   Procedure: ESOPHAGOGASTRODUODENOSCOPY (EGD) WITH PROPOFOL;  Surgeon: [name], MD;  Location: [facility] ENDOSCOPY;  Service: Endoscopy;  Laterality: N/A;  . GIVENS CAPSULE STUDY N/A 09/26/2016   Procedure: GIVENS CAPSULE STUDY;  Surgeon: [name], MD;  Location: [facility] ENDOSCOPY;  Service: Endoscopy;  Laterality: N/A;  . HERNIA REPAIR  2013  . LUMBAR SPINE SURGERY    . UPPER GI ENDOSCOPY  Sept 2015   Dr Rayann Heman    Prior to Admission medications   Medication Sig Start Date End Date Taking? Authorizing Provider  amoxicillin-clavulanate (AUGMENTIN) 875-125 MG tablet Take 1 tablet by mouth 2 (two) times daily. 09/19/16   Coral Spikes, DO  cetirizine (ZYRTEC) 10 MG tablet Take 1 tablet (10 mg total) by mouth daily. 05/03/16   Coral Spikes, DO  cyproheptadine (PERIACTIN) 4 MG tablet Take 1 tablet (4 mg total) by mouth  3 (three) times daily as needed for allergies. 05/18/15   [name], MD  diltiazem (CARTIA XT) 240 MG 24 hr capsule Take 1 capsule (240 mg total) by mouth daily. 04/02/16   Coral Spikes, DO  esomeprazole (NEXIUM) 40 MG capsule TAKE 1 CAPSULE DAILY AT 12 NOON 06/28/16   Coral Spikes, DO  fenofibrate micronized (ANTARA) 130 MG capsule TAKE 1 CAPSULE DAILY BEFORE BREAKFAST 12/25/15   [name], MD  ferrous sulfate 325 (65 FE) MG tablet Take 1 tablet (325 mg total) by mouth 2 (two) times daily with a meal. 07/30/16 09/30/16  [name], MD  finasteride (PROSCAR) 5 MG tablet Take 5 mg by mouth daily.      Historical Provider, MD  fluticasone (FLONASE) 50 MCG/ACT nasal spray Place 2 sprays into both nostrils daily. 05/03/16   Coral Spikes, DO  isosorbide mononitrate (IMDUR) 30 MG 24 hr tablet TAKE 1 TABLET DAILY 10/25/15   [name], MD  levobunolol (BETAGAN) 0.5 % ophthalmic solution 1 drop 2 (two) times daily.     Historical Provider, MD  lisinopril (PRINIVIL,ZESTRIL) 5 MG tablet TAKE 1 TABLET DAILY 09/30/16   [name], MD  metoprolol (LOPRESSOR) 50 MG tablet TAKE ONE TABLET (50 MG) TWICE A DAY. Patient taking differently: 25 mg 2 (two) times daily. TAKE ONE TABLET (50 MG)  TWICE A DAY. 08/05/16   Coral Spikes, DO  nitroGLYCERIN (NITROSTAT) 0.4 MG SL tablet DISSOLVE UNDER THE TONGUE 1 TABLET EVERY 5 MINUTES AS NEEDED FOR CHEST PAIN 06/11/16   [name], MD  Omega-3 Fatty Acids (FISH OIL) 1000 MG CAPS Take 1,000 mg by mouth daily.    Historical Provider, MD  pravastatin (PRAVACHOL) 40 MG tablet Take 1 tablet (40 mg total) by mouth at bedtime. 09/11/16   Coral Spikes, DO  Tamsulosin HCl (FLOMAX) 0.4 MG CAPS Take 0.4 mg by mouth daily.    Historical Provider, MD  XARELTO 20 MG TABS tablet TAKE 1 TABLET DAILY 07/22/16   [name], MD    Allergies as of 09/23/2016 - Review Complete 09/19/2016  Allergen Reaction Noted  . Bee venom Swelling 07/17/2015  . Sulfa antibiotics  Hives and Swelling 01/22/2016  . Warfarin and related Other (See Comments) 05/18/2012    Family History  Problem Relation Age of Onset  . Stroke Mother   . Colon cancer Father     Social History   Social History  . Marital status: Married    Spouse name: N/A  . Number of children: N/A  . Years of education: N/A   Occupational History  . Not on file.   Social History Main Topics  . Smoking status: Never Smoker  . Smokeless tobacco: Never Used  . Alcohol use No  . Drug use: No  . Sexual activity: Not on file   Other Topics Concern  . Not on file   Social History Narrative  . No narrative on file    Review of Systems: See HPI, otherwise negative ROS  Physical Exam: There were no vitals taken for this visit. General:   Alert,  pleasant and cooperative in NAD Head:  Normocephalic and atraumatic. Neck:  Supple; no masses or thyromegaly. Lungs:  Clear throughout to auscultation.    Heart:  Regular rate and rhythm. Abdomen:  Soft, nontender and nondistended. Normal bowel sounds, without guarding, and without rebound.   Neurologic:  Alert and  oriented x4;  grossly normal neurologically.  Impression/Plan: William Taylor is here for an capsule study  to be performed for iron deficiency anemia  Risks, benefits, limitations, and alternatives regarding  Capsule study  have been reviewed with the patient.  Questions have been answered.  All parties agreeable.   [name], MD  09/30/2016, 8:14 AM

## 2016-10-14 ENCOUNTER — Encounter: Payer: Self-pay | Admitting: Cardiovascular Disease

## 2016-10-14 ENCOUNTER — Ambulatory Visit (INDEPENDENT_AMBULATORY_CARE_PROVIDER_SITE_OTHER): Payer: Medicare Other | Admitting: Cardiovascular Disease

## 2016-10-14 VITALS — BP 124/72 | HR 61 | Ht 76.0 in | Wt 228.5 lb

## 2016-10-14 DIAGNOSIS — N183 Chronic kidney disease, stage 3 unspecified: Secondary | ICD-10-CM

## 2016-10-14 DIAGNOSIS — I4891 Unspecified atrial fibrillation: Secondary | ICD-10-CM

## 2016-10-14 DIAGNOSIS — D509 Iron deficiency anemia, unspecified: Secondary | ICD-10-CM | POA: Diagnosis not present

## 2016-10-14 DIAGNOSIS — E782 Mixed hyperlipidemia: Secondary | ICD-10-CM | POA: Diagnosis not present

## 2016-10-14 DIAGNOSIS — I2510 Atherosclerotic heart disease of native coronary artery without angina pectoris: Secondary | ICD-10-CM

## 2016-10-14 DIAGNOSIS — I509 Heart failure, unspecified: Secondary | ICD-10-CM

## 2016-10-14 NOTE — Patient Instructions (Signed)

## 2016-10-14 NOTE — Progress Notes (Signed)
Cardiology Office Note  Date:  10/14/2016   ID:  William, Taylor 1930-01-19, MRN [id number]  PCP:  William Spikes, DO   Chief Complaint  Patient presents with  . other    3 month follow up. Meds reviewed by the pt. verbally. "doing well."    HPI:  [name] is a very pleasant 83 year old gentleman with a history of coronary artery disease, stent in 2005, atrial fibrillation with RVR in the setting of UTI September 2011, stent placement May 2012 to the OM 2, PE following catheterization started on anticoagulation, atrial fibrillation in 2013, who presents for follow-up of his coronary disease and atrial fibrillation History of anemia  On his last clinic visit he had anemia hematocrit down to 28 Recent  EGD, colonoscopy, video capsule enteroscopy  He has follow-up with GI in one week, polyps noted Results reviewed with him in brief, pulled up, computer He denies any bleeding Stool was dark from iron which she takes once a day Feeling well HCT 28 to 34  Recent sore throat, sore Dr. Freddy Taylor amoxicillin with improvement of his symptoms   Last week, though rhythm was off a little, Checked ok on his pulse oximeter   Not taking NTG, once in the past month  not on a regular basis  Denies any significant shortness of breath,fatigue with exertion.  Denies any  Significant palpitations concerning for arrhythmia  Other lab work reviewed  Total chol 110, LDL 64 HBA1C 6.5   previous  injury of his left shoulder, rotator cuff tear   EKG on today's visit shows normal sinus rhythm with rate 62 bpm, no significant ST or T-wave changes   other past medical history previous episode ofchest pain, went to the emergency room November 2016. Ruled out for MI, had Myoview showing no ischemia. He was noted to have atrial tachycardia with rates up to the 150 range, metoprolol tartrate increased up to 50 mg twice a day. On the higher dose metoprolol, he has not had any further  episodes of tachycardia since discharge. Previously he did note heart rate up to 190 going down to 150, measured with a pulse oximeter. None recently  catheterization in 2009 showing patent stent with no other significant disease, normal systolic function in September 2011 who presented to the emergency room September 2011 with malaise, atrial fibrillation with RVR in the setting of a urinary tract infection, recent admission to the hospital November 14 2010 for atrial fibrillation that developed after pushing mowing with some chest pain. He converted back to normal sinus rhythm, Presenting to [facility] with chest discomfort with cardiac catheterization Dec 25 2010, showing 90% OM 2 disease, transferred to [facility] with DES stent placed, 2.25 x 16 mm POMUS stent, with flank pain developing after discharge, admitted to [facility] and diagnosed with pulmonary embolism with heavy clot burden.   Found to be in atrial fibrillation in 2013. He had symptoms of shortness of breath with exertion.  After one month on xarelto, amiodarone load and titration downward was started with conversion to NSR in October 2013  EKG the end of October 2013 confirmed normal sinus rhythm.   History of back surgery in 2008, history of prostate cancer followed by Dr. Jacqlyn Taylor   He was admitted to the hospital 10/27/2013 with chest pain. He had held his Plavix for colonoscopy 10/26/2013 with polypectomy x3. Cardiac catheterization performed given his severe chest pain and no disease showing severe small vessel disease, patent stents Amiodarone was held  for bradycardia, he was started on isosorbide 30 mg daily, Plavix was held, he was continued on aspirin and xarelto.  Cardiac catheterization results; 90% disease at the ostium of a small diagonal vessel, small OM vessel with 80% disease in the proximal region Otherwise stents are patent  Echocardiogram 10/28/2013 shows normal LV function, mildly dilated left atrium, mild aortic regurg,  normal right ventricular systolic pressures   PMH:   has a past medical history of Cancer (William Taylor); Colon polyp; Coronary artery disease; Hyperlipidemia; Hypertension; MI (myocardial infarction); PAF (paroxysmal atrial fibrillation) ([place]); and Skin cancer.  PSH:    Past Surgical History:  Procedure Laterality Date  . CARDIAC CATHETERIZATION  10/2013   [facility]  . CATARACT EXTRACTION  Oct. 3, 2012   right eye  . colonoscopy    . COLONOSCOPY W/ POLYPECTOMY  2015   Dr William Taylor  . COLONOSCOPY WITH PROPOFOL N/A 08/15/2016   Procedure: COLONOSCOPY WITH PROPOFOL;  Surgeon: William Bellows, MD;  Location: [facility] ENDOSCOPY;  Service: Endoscopy;  Laterality: N/A;  . CORONARY ANGIOPLASTY  2009   2005; s/p stent  . ESOPHAGOGASTRODUODENOSCOPY (EGD) WITH PROPOFOL N/A 08/15/2016   Procedure: ESOPHAGOGASTRODUODENOSCOPY (EGD) WITH PROPOFOL;  Surgeon: William Bellows, MD;  Location: [facility] ENDOSCOPY;  Service: Endoscopy;  Laterality: N/A;  . GIVENS CAPSULE STUDY N/A 09/26/2016   Procedure: GIVENS CAPSULE STUDY;  Surgeon: William Bellows, MD;  Location: [facility] ENDOSCOPY;  Service: Endoscopy;  Laterality: N/A;  . HERNIA REPAIR  2013  . LUMBAR SPINE SURGERY    . UPPER GI ENDOSCOPY  Sept 2015   Dr William Taylor    Current Outpatient Prescriptions  Medication Sig Dispense Refill  . cyproheptadine (PERIACTIN) 4 MG tablet Take 1 tablet (4 mg total) by mouth 3 (three) times daily as needed for allergies. 90 tablet 3  . diltiazem (CARTIA XT) 240 MG 24 hr capsule Take 1 capsule (240 mg total) by mouth daily. 90 capsule 2  . esomeprazole (NEXIUM) 40 MG capsule TAKE 1 CAPSULE DAILY AT 12 NOON 90 capsule 3  . fenofibrate micronized (ANTARA) 130 MG capsule TAKE 1 CAPSULE DAILY BEFORE BREAKFAST 90 capsule 2  . ferrous sulfate 325 (65 FE) MG tablet Take 1 tablet (325 mg total) by mouth 2 (two) times daily with a meal. 124 tablet 2  . finasteride (PROSCAR) 5 MG tablet Take 5 mg by mouth daily.      . fluticasone (FLONASE) 50 MCG/ACT nasal spray Place 2  sprays into both nostrils daily. 16 g 6  . isosorbide mononitrate (IMDUR) 30 MG 24 hr tablet TAKE 1 TABLET DAILY 90 tablet 3  . levobunolol (BETAGAN) 0.5 % ophthalmic solution 1 drop 2 (two) times daily.     Marland Kitchen lisinopril (PRINIVIL,ZESTRIL) 5 MG tablet TAKE 1 TABLET DAILY 90 tablet 3  . metoprolol tartrate (LOPRESSOR) 25 MG tablet Take 25 mg by mouth 2 (two) times daily.    . nitroGLYCERIN (NITROSTAT) 0.4 MG SL tablet DISSOLVE UNDER THE TONGUE 1 TABLET EVERY 5 MINUTES AS NEEDED FOR CHEST PAIN 25 tablet 3  . Omega-3 Fatty Acids (FISH OIL) 1000 MG CAPS Take 1,000 mg by mouth daily.    . pravastatin (PRAVACHOL) 40 MG tablet Take 1 tablet (40 mg total) by mouth at bedtime. 90 tablet 3  . Tamsulosin HCl (FLOMAX) 0.4 MG CAPS Take 0.4 mg by mouth daily.    Alveda Reasons 20 MG TABS tablet TAKE 1 TABLET DAILY 90 tablet 3   No current facility-administered medications for this visit.  Allergies:   Bee venom; Sulfa antibiotics; and Warfarin and related   Social History:  The patient  reports that he has never smoked. He has never used smokeless tobacco. He reports that he does not drink alcohol or use drugs.   Family History:   family history includes Colon cancer in his father; Stroke in his mother.    Review of Systems: Review of Systems  Constitutional: Negative.   Respiratory: Negative.   Cardiovascular: Negative.   Gastrointestinal: Negative.   Musculoskeletal: Negative.   Neurological: Negative.   Psychiatric/Behavioral: Negative.   All other systems reviewed and are negative.    PHYSICAL EXAM: VS:  BP 124/72 (BP Location: Left Arm, Patient Position: Sitting, Cuff Size: Normal)   Pulse 61   Ht 6\' 4"  (1.93 m)   Wt 228 lb 8 oz (103.6 kg)   BMI 27.81 kg/m  , BMI Body mass index is 27.81 kg/m. GEN: Well nourished, well developed, in no acute distress  HEENT: normal  Neck: no JVD, carotid bruits, or masses Cardiac: RRR; no murmurs, rubs, or gallops,no edema  Respiratory:  clear to  auscultation bilaterally, normal work of breathing GI: soft, nontender, nondistended, + BS MS: no deformity or atrophy  Skin: warm and dry, no rash Neuro:  Strength and sensation are intact Psych: euthymic mood, full affect    Recent Labs: 12/27/2015: ALT 13 01/14/2016: BUN 21; Creatinine, Ser 1.35; Potassium 3.9; Sodium 139 09/11/2016: Hemoglobin 11.9; Platelets 216    Lipid Panel Lab Results  Component Value Date   CHOL 110 06/28/2016   HDL 29.90 (L) 06/28/2016   LDLCALC 64 06/28/2016   TRIG 80.0 06/28/2016      Wt Readings from Last 3 Encounters:  10/14/16 228 lb 8 oz (103.6 kg)  09/19/16 222 lb 12.8 oz (101.1 kg)  09/11/16 226 lb (102.5 kg)       ASSESSMENT AND PLAN:  Mixed hyperlipidemia - Plan: EKG 12-Lead Cholesterol is at goal on the current lipid regimen. No changes to the medications were made.  Coronary artery disease involving native coronary artery of native heart without angina pectoris - Plan: EKG 12-Lead Currently with no symptoms of angina. No further workup at this time. Continue current medication regimen.  Atrial fibrillation, unspecified type ([place]) - Plan: EKG 12-Lead Maintaining normal sinus rhythm and anticoagulation Discussed risk and benefit of anticoagulation with him, he will stay on Xarelto  Congestive heart failure, unspecified congestive heart failure chronicity, unspecified congestive heart failure type ([place]) - Plan: EKG 12-Lead Appears relatively euvolemic on today's visit was, minimal ankle edema, wears compression hose, no change in shortness of breath  Chronic kidney disease (CKD), stage III (moderate) Stable renal function, recommended he avoid NSAIDs  Iron deficiency anemia, unspecified iron deficiency anemia type  dramatic improvement in blood count hemoglobin up to 12 Baseline typically 13   Total encounter time more than 25 minutes  Greater than 50% was spent in counseling and coordination of care with the  patient   Disposition:   F/U  6 months   Orders Placed This Encounter  Procedures  . EKG 12-Lead     Signed, Esmond Plants, M.D., Ph.D. 10/14/2016  [place], [place]

## 2016-10-17 ENCOUNTER — Other Ambulatory Visit: Payer: Self-pay

## 2016-10-17 ENCOUNTER — Telehealth: Payer: Self-pay | Admitting: Gastroenterology

## 2016-10-17 MED ORDER — FERROUS SULFATE 325 (65 FE) MG PO TABS: 325.0000 mg | ORAL_TABLET | Freq: Two times a day (BID) | ORAL | 2 refills | Status: DC

## 2016-10-17 NOTE — Telephone Encounter (Signed)
Patient needs a refill on his iron [facility] Phone [phone] Fax [phone]

## 2016-10-19 ENCOUNTER — Other Ambulatory Visit: Payer: Self-pay | Admitting: Cardiovascular Disease

## 2016-10-23 ENCOUNTER — Other Ambulatory Visit: Payer: Self-pay | Admitting: *Deleted

## 2016-10-23 ENCOUNTER — Other Ambulatory Visit: Payer: Self-pay

## 2016-10-23 MED ORDER — ISOSORBIDE MONONITRATE ER 30 MG PO TB24: 30.0000 mg | ORAL_TABLET | Freq: Every day | ORAL | 3 refills | Status: DC

## 2016-10-23 MED ORDER — LISINOPRIL 5 MG PO TABS: 5.0000 mg | ORAL_TABLET | Freq: Every day | ORAL | 3 refills | Status: DC

## 2016-10-23 MED ORDER — FENOFIBRATE MICRONIZED 130 MG PO CAPS: ORAL_CAPSULE | ORAL | 2 refills | Status: DC

## 2016-10-23 MED ORDER — FLUTICASONE PROPIONATE 50 MCG/ACT NA SUSP: 2.0000 | Freq: Every day | NASAL | 6 refills | Status: DC

## 2016-11-13 ENCOUNTER — Ambulatory Visit: Payer: Medicare Other | Admitting: Gastroenterology

## 2016-12-10 ENCOUNTER — Encounter: Payer: Self-pay | Admitting: Gastroenterology

## 2016-12-10 ENCOUNTER — Other Ambulatory Visit
Admission: RE | Admit: 2016-12-10 | Discharge: 2016-12-10 | Disposition: A | Discharge status: Home / Self Care | Payer: Medicare Other | Source: Ambulatory Visit | Attending: Gastroenterology | Admitting: Gastroenterology

## 2016-12-10 ENCOUNTER — Ambulatory Visit (INDEPENDENT_AMBULATORY_CARE_PROVIDER_SITE_OTHER): Payer: Medicare Other | Admitting: Gastroenterology

## 2016-12-10 VITALS — BP 148/81 | HR 76 | Temp 97.6°F | Ht 76.0 in | Wt 230.0 lb

## 2016-12-10 DIAGNOSIS — D509 Iron deficiency anemia, unspecified: Secondary | ICD-10-CM | POA: Insufficient documentation

## 2016-12-10 DIAGNOSIS — K6389 Other specified diseases of intestine: Secondary | ICD-10-CM

## 2016-12-10 LAB — CBC WITH DIFFERENTIAL/PLATELET
BASOS ABS: 0.1 10*3/uL (ref 0–0.1)
BASOS PCT: 1 %
EOS PCT: 1 %
Eosinophils Absolute: 0.1 10*3/uL (ref 0–0.7)
HCT: 37.6 % — ABNORMAL LOW (ref 40.0–52.0)
Hemoglobin: 12.7 g/dL — ABNORMAL LOW (ref 13.0–18.0)
LYMPHS PCT: 28 %
Lymphs Abs: 2 10*3/uL (ref 1.0–3.6)
MCH: 33.3 pg (ref 26.0–34.0)
MCHC: 33.8 g/dL (ref 32.0–36.0)
MCV: 98.5 fL (ref 80.0–100.0)
Monocytes Absolute: 0.6 10*3/uL (ref 0.2–1.0)
Monocytes Relative: 9 %
NEUTROS ABS: 4.3 10*3/uL (ref 1.4–6.5)
Neutrophils Relative %: 61 %
PLATELETS: 220 10*3/uL (ref 150–440)
RBC: 3.82 MIL/uL — AB (ref 4.40–5.90)
RDW: 14.4 % (ref 11.5–14.5)
WBC: 7.1 10*3/uL (ref 3.8–10.6)

## 2016-12-10 LAB — IRON AND TIBC
Iron: 148 ug/dL (ref 45–182)
SATURATION RATIOS: 37 % (ref 17.9–39.5)
TIBC: 399 ug/dL (ref 250–450)
UIBC: 251 ug/dL

## 2016-12-10 LAB — FERRITIN: Ferritin: 66 ng/mL (ref 24–336)

## 2016-12-10 NOTE — Progress Notes (Signed)
Primary Care Physician: Coral Spikes, DO  Primary Gastroenterologist:  Dr. [name]   Chief Complaint  Patient presents with  . Follow-up    HPI: William Taylor is a 83 y.o. male   He is here today for follow up . He was seen last on 09/11/16 when he was referred for iron deficiency anemia.  Summary of history : He has a history of CAD,stent placment in 2005 , Atrial fibrillation 2011, On anticoagulation . At his recent visit to see his cardiologist Dr Rockey Situ , reported symptoms of shortness of breath, fatigue ,exertion . He was noted to have a drop in his Hb , stool was positive for blood . He is on xarelto. He has external hemorroids. 06/2016 Ferritin 9 , TIBC elevated. 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  Interval history 08/2016-11/2016   10/03/16- capsule study shows no bleeding but a few small polyps were seen .  Hb 08/2016 improved to 11.9 grams    Taking his oral iron , no bleeding seen , no other complaints.    Current Outpatient Prescriptions  Medication Sig Dispense Refill  . diltiazem (CARTIA XT) 240 MG 24 hr capsule Take 1 capsule (240 mg total) by mouth daily. 90 capsule 2  . esomeprazole (NEXIUM) 40 MG capsule TAKE 1 CAPSULE DAILY AT 12 NOON 90 capsule 3  . fenofibrate micronized (ANTARA) 130 MG capsule TAKE 1 CAPSULE DAILY BEFORE BREAKFAST 90 capsule 2  . ferrous sulfate 325 (65 FE) MG tablet Take 1 tablet (325 mg total) by mouth 2 (two) times daily with a meal. 124 tablet 2  . finasteride (PROSCAR) 5 MG tablet Take 5 mg by mouth daily.      . isosorbide mononitrate (IMDUR) 30 MG 24 hr tablet Take 1 tablet (30 mg total) by mouth daily. 90 tablet 3  . levobunolol (BETAGAN) 0.5 % ophthalmic solution 1 drop 2 (two) times daily.     Marland Kitchen lisinopril  (PRINIVIL,ZESTRIL) 5 MG tablet Take 1 tablet (5 mg total) by mouth daily. 90 tablet 3  . metoprolol tartrate (LOPRESSOR) 25 MG tablet Take 25 mg by mouth 2 (two) times daily.    . nitroGLYCERIN (NITROSTAT) 0.4 MG SL tablet DISSOLVE UNDER THE TONGUE 1 TABLET EVERY 5 MINUTES AS NEEDED FOR CHEST PAIN 25 tablet 3  . Omega-3 Fatty Acids (FISH OIL) 1000 MG CAPS Take 1,000 mg by mouth daily.    . pravastatin (PRAVACHOL) 40 MG tablet Take 1 tablet (40 mg total) by mouth at bedtime. 90 tablet 3  . Tamsulosin HCl (FLOMAX) 0.4 MG CAPS Take 0.4 mg by mouth daily.    Alveda Reasons 20 MG TABS tablet TAKE 1 TABLET DAILY 90 tablet 3  . cyproheptadine (PERIACTIN) 4 MG tablet Take 1 tablet (4 mg total) by mouth 3 (three) times daily as needed for allergies. (Patient not taking: Reported on 12/10/2016) 90 tablet 3  . fluticasone (FLONASE) 50 MCG/ACT nasal spray Place 2 sprays into both nostrils daily. (Patient not taking: Reported on 12/10/2016) 16 g 6   No current facility-administered medications for this visit.     Allergies as of 12/10/2016 - Review Complete 12/10/2016  Allergen Reaction Noted  . Bee venom Swelling 07/17/2015  . Sulfa antibiotics Hives and Swelling 01/22/2016  . Warfarin and related Other (See Comments) 05/18/2012  ROS:  General: Negative for anorexia, weight loss, fever, chills, fatigue, weakness. ENT: Negative for hoarseness, difficulty swallowing , nasal congestion. CV: Negative for chest pain, angina, palpitations, dyspnea on exertion, peripheral edema.  Respiratory: Negative for dyspnea at rest, dyspnea on exertion, cough, sputum, wheezing.  GI: See history of present illness. GU:  Negative for dysuria, hematuria, urinary incontinence, urinary frequency, nocturnal urination.  Endo: Negative for unusual weight change.    Physical Examination:   BP (!) 148/81 (BP Location: Left Arm, Patient Position: Sitting, Cuff Size: Normal)   Pulse 76   Temp 97.6 F (36.4 C) (Oral)   Ht 6\' 4"   (1.93 m)   Wt 230 lb (104.3 kg)   BMI 28.00 kg/m   General: Well-nourished, well-developed in no acute distress.  Eyes: No icterus. Conjunctivae pink. Mouth: Oropharyngeal mucosa moist and pink , no lesions erythema or exudate. Lungs: Clear to auscultation bilaterally. Non-labored. Heart: Regular rate and rhythm, no murmurs rubs or gallops.  Abdomen: Bowel sounds are normal, nontender, nondistended, no hepatosplenomegaly or masses, no abdominal bruits or hernia , no rebound or guarding.   Extremities: No lower extremity edema. No clubbing or deformities. Neuro: Alert and oriented x 3.  Grossly intact. Skin: Warm and dry, no jaundice.   Psych: Alert and cooperative, normal mood and affect.   Imaging Studies: No results found.  Assessment and Plan:   William Taylor is a 83 y.o. y/o male here for follow up for iron deficiency anemia.  commenced him on oral iron in 07/2016. Hb has improved. Capsule study showed 4 small tiny small bowel polyps which were non bleeding . Long discussion with possible options of doing nothing vs small bowel enteroscopy, discussed risk of procedure, anesthesia issues , discussed low risk of malignancy of small bowel but with polyps slightly increased risk and only way to determine nature of polyp is by biopsy. At present he is not keen on enteroscopy and I will monitor his CBC closely.   Dr [name]  MD Follow up in 4 months

## 2016-12-26 ENCOUNTER — Ambulatory Visit (INDEPENDENT_AMBULATORY_CARE_PROVIDER_SITE_OTHER): Payer: Medicare Other | Admitting: Family Medicine

## 2016-12-26 ENCOUNTER — Encounter: Payer: Self-pay | Admitting: Family Medicine

## 2016-12-26 DIAGNOSIS — D509 Iron deficiency anemia, unspecified: Secondary | ICD-10-CM

## 2016-12-26 NOTE — Assessment & Plan Note (Signed)
Improving. Patient has had an extensive workup. I am greatly appreciative to Dr. [name]. Continue to follow with GI. Continue iron supplementation.

## 2016-12-26 NOTE — Progress Notes (Signed)
   Subjective:  Patient ID: William Taylor, male    DOB: 1930-08-09  Age: 83 y.o. MRN: [id number]  CC: Follow up  HPI:  83 year old male with an extensive past medical history including atrial fibrillation, coronary artery disease, CHF, CKD, hypertension, hyperlipidemia, anemia, hx of prostate cancer, PE presents for follow up regarding anemia.   Anemia, iron deficiency  Given anemia and positive Hemoccult he was sent to GI.  He has had EGD which revealed gastric polyp with stigmata of recent bleed. Colonoscopy with cecal AVM, diverticulosis, and polyp.  Subsequently had capsule study which showed no bleeding and a few polyps.  His anemia has improved with iron therapy.  He is doing well at this time. He continues to follow with GI.  Social Hx   Social History   Social History  . Marital status: Married    Spouse name: N/A  . Number of children: N/A  . Years of education: N/A   Social History Main Topics  . Smoking status: Never Smoker  . Smokeless tobacco: Never Used  . Alcohol use No  . Drug use: No  . Sexual activity: Not Asked   Other Topics Concern  . None   Social History Narrative  . None    Review of Systems  Constitutional: Negative.   Respiratory: Negative.   Cardiovascular: Negative.    Objective:  BP 136/78 (BP Location: Left Arm, Patient Position: Sitting, Cuff Size: Normal)   Pulse 64   Temp 97.7 F (36.5 C) (Oral)   Resp 14   Wt 230 lb 8 oz (104.6 kg)   SpO2 94%   BMI 28.06 kg/m   BP/Weight 12/26/2016 12/10/2016 4/50/3888  Systolic BP 280 034 917  Diastolic BP 78 81 72  Wt. (Lbs) 230.5 230 228.5  BMI 28.06 28 27.81   Physical Exam  Constitutional: He is oriented to person, place, and time. He appears well-developed. No distress.  Cardiovascular: Normal rate and regular rhythm.   Pulmonary/Chest: Effort normal and breath sounds normal.  Neurological: He is alert and oriented to person, place, and time.  Psychiatric: He has a normal  mood and affect.  Vitals reviewed.  Lab Results  Component Value Date   WBC 7.1 12/10/2016   HGB 12.7 (L) 12/10/2016   HCT 37.6 (L) 12/10/2016   PLT 220 12/10/2016   GLUCOSE 144 (H) 01/14/2016   CHOL 110 06/28/2016   TRIG 80.0 06/28/2016   HDL 29.90 (L) 06/28/2016   LDLCALC 64 06/28/2016   ALT 13 12/27/2015   AST 18 12/27/2015   NA 139 01/14/2016   K 3.9 01/14/2016   CL 107 01/14/2016   CREATININE 1.35 (H) 01/14/2016   BUN 21 (H) 01/14/2016   CO2 25 01/14/2016   INR 2.43 01/14/2016   HGBA1C 6.5 06/28/2016    Assessment & Plan:   Problem List Items Addressed This Visit    Iron deficiency anemia    Improving. Patient has had an extensive workup. I am greatly appreciative to Dr. [name]. Continue to follow with GI. Continue iron supplementation.        Follow-up:  6 months to 1 year  William Taylor

## 2016-12-26 NOTE — Patient Instructions (Signed)
Continue your meds.  Follow up in 6 months to 1 year.  Take care  Dr. Lacinda Axon

## 2017-02-02 ENCOUNTER — Other Ambulatory Visit: Payer: Self-pay | Admitting: Family Medicine

## 2017-03-06 ENCOUNTER — Other Ambulatory Visit: Payer: Self-pay

## 2017-03-06 MED ORDER — FENOFIBRATE MICRONIZED 130 MG PO CAPS: ORAL_CAPSULE | ORAL | 1 refills | Status: DC

## 2017-03-07 ENCOUNTER — Observation Stay
Admission: EM | Admit: 2017-03-07 | Discharge: 2017-03-09 | Disposition: A | Discharge status: Home / Self Care | Payer: Medicare Other | Attending: Internal Medicine | Admitting: Internal Medicine

## 2017-03-07 ENCOUNTER — Encounter: Payer: Self-pay | Admitting: Emergency Medicine

## 2017-03-07 ENCOUNTER — Telehealth: Payer: Self-pay | Admitting: Cardiovascular Disease

## 2017-03-07 ENCOUNTER — Emergency Department: Payer: Medicare Other

## 2017-03-07 DIAGNOSIS — I700 Atherosclerosis of aorta: Secondary | ICD-10-CM | POA: Diagnosis not present

## 2017-03-07 DIAGNOSIS — I130 Hypertensive heart and chronic kidney disease with heart failure and stage 1 through stage 4 chronic kidney disease, or unspecified chronic kidney disease: Secondary | ICD-10-CM | POA: Insufficient documentation

## 2017-03-07 DIAGNOSIS — E785 Hyperlipidemia, unspecified: Secondary | ICD-10-CM | POA: Diagnosis not present

## 2017-03-07 DIAGNOSIS — N183 Chronic kidney disease, stage 3 unspecified: Secondary | ICD-10-CM | POA: Diagnosis present

## 2017-03-07 DIAGNOSIS — Z79899 Other long term (current) drug therapy: Secondary | ICD-10-CM | POA: Diagnosis not present

## 2017-03-07 DIAGNOSIS — Z8546 Personal history of malignant neoplasm of prostate: Secondary | ICD-10-CM | POA: Diagnosis not present

## 2017-03-07 DIAGNOSIS — I951 Orthostatic hypotension: Secondary | ICD-10-CM | POA: Insufficient documentation

## 2017-03-07 DIAGNOSIS — E782 Mixed hyperlipidemia: Secondary | ICD-10-CM | POA: Diagnosis present

## 2017-03-07 DIAGNOSIS — I2510 Atherosclerotic heart disease of native coronary artery without angina pectoris: Secondary | ICD-10-CM | POA: Insufficient documentation

## 2017-03-07 DIAGNOSIS — Z7901 Long term (current) use of anticoagulants: Secondary | ICD-10-CM | POA: Diagnosis not present

## 2017-03-07 DIAGNOSIS — I252 Old myocardial infarction: Secondary | ICD-10-CM | POA: Diagnosis not present

## 2017-03-07 DIAGNOSIS — R079 Chest pain, unspecified: Principal | ICD-10-CM | POA: Diagnosis present

## 2017-03-07 DIAGNOSIS — I25118 Atherosclerotic heart disease of native coronary artery with other forms of angina pectoris: Secondary | ICD-10-CM | POA: Diagnosis present

## 2017-03-07 DIAGNOSIS — Z85828 Personal history of other malignant neoplasm of skin: Secondary | ICD-10-CM | POA: Insufficient documentation

## 2017-03-07 DIAGNOSIS — I480 Paroxysmal atrial fibrillation: Secondary | ICD-10-CM | POA: Diagnosis not present

## 2017-03-07 LAB — COMPREHENSIVE METABOLIC PANEL
ALBUMIN: 4.1 g/dL (ref 3.5–5.0)
ALK PHOS: 38 U/L (ref 38–126)
ALT: 16 U/L — ABNORMAL LOW (ref 17–63)
AST: 27 U/L (ref 15–41)
Anion gap: 7 (ref 5–15)
BUN: 20 mg/dL (ref 6–20)
CO2: 25 mmol/L (ref 22–32)
Calcium: 8.9 mg/dL (ref 8.9–10.3)
Chloride: 106 mmol/L (ref 101–111)
Creatinine, Ser: 1.26 mg/dL — ABNORMAL HIGH (ref 0.61–1.24)
GFR calc Af Amer: 58 mL/min — ABNORMAL LOW (ref >60)
GFR calc non Af Amer: 50 mL/min — ABNORMAL LOW (ref >60)
GLUCOSE: 135 mg/dL — AB (ref 65–99)
POTASSIUM: 4.5 mmol/L (ref 3.5–5.1)
SODIUM: 138 mmol/L (ref 135–145)
Total Bilirubin: 1 mg/dL (ref 0.3–1.2)
Total Protein: 7.2 g/dL (ref 6.5–8.1)

## 2017-03-07 LAB — CBC
HEMATOCRIT: 40.3 % (ref 40.0–52.0)
HEMOGLOBIN: 13.9 g/dL (ref 13.0–18.0)
MCH: 33.7 pg (ref 26.0–34.0)
MCHC: 34.5 g/dL (ref 32.0–36.0)
MCV: 97.8 fL (ref 80.0–100.0)
Platelets: 220 10*3/uL (ref 150–440)
RBC: 4.12 MIL/uL — ABNORMAL LOW (ref 4.40–5.90)
RDW: 13.2 % (ref 11.5–14.5)
WBC: 7.3 10*3/uL (ref 3.8–10.6)

## 2017-03-07 LAB — TROPONIN I: Troponin I: <0.03 ng/mL (ref <0.03)

## 2017-03-07 MED ORDER — LEVOBUNOLOL HCL 0.5 % OP SOLN
1.0000 [drp] | Freq: Two times a day (BID) | OPHTHALMIC | Status: DC
  Administered 2017-03-07 – 2017-03-09 (×4): 1 [drp] via OPHTHALMIC
  Filled 2017-03-07: qty 5

## 2017-03-07 MED ORDER — FINASTERIDE 5 MG PO TABS
5.0000 mg | ORAL_TABLET | Freq: Every day | ORAL | Status: DC
  Administered 2017-03-08 – 2017-03-09 (×2): 5 mg via ORAL
  Filled 2017-03-07 (×2): qty 1

## 2017-03-07 MED ORDER — RIVAROXABAN 20 MG PO TABS
20.0000 mg | ORAL_TABLET | Freq: Every day | ORAL | Status: DC
  Administered 2017-03-08: 20 mg via ORAL
  Filled 2017-03-07 (×2): qty 1

## 2017-03-07 MED ORDER — ACETAMINOPHEN 325 MG PO TABS: 650.0000 mg | ORAL_TABLET | Freq: Four times a day (QID) | ORAL | Status: DC | PRN

## 2017-03-07 MED ORDER — OXYCODONE HCL 5 MG PO TABS: 5.0000 mg | ORAL_TABLET | ORAL | Status: DC | PRN

## 2017-03-07 MED ORDER — ISOSORBIDE MONONITRATE ER 30 MG PO TB24
30.0000 mg | ORAL_TABLET | Freq: Every day | ORAL | Status: DC
  Administered 2017-03-08: 30 mg via ORAL
  Filled 2017-03-07: qty 1

## 2017-03-07 MED ORDER — DILTIAZEM HCL ER COATED BEADS 120 MG PO CP24
240.0000 mg | ORAL_CAPSULE | Freq: Every day | ORAL | Status: DC
  Administered 2017-03-08: 240 mg via ORAL
  Filled 2017-03-07: qty 2

## 2017-03-07 MED ORDER — ACETAMINOPHEN 650 MG RE SUPP: 650.0000 mg | Freq: Four times a day (QID) | RECTAL | Status: DC | PRN

## 2017-03-07 MED ORDER — ONDANSETRON HCL 4 MG PO TABS: 4.0000 mg | ORAL_TABLET | Freq: Four times a day (QID) | ORAL | Status: DC | PRN

## 2017-03-07 MED ORDER — LISINOPRIL 5 MG PO TABS
5.0000 mg | ORAL_TABLET | Freq: Every day | ORAL | Status: DC
  Administered 2017-03-08 – 2017-03-09 (×2): 5 mg via ORAL
  Filled 2017-03-07 (×2): qty 1

## 2017-03-07 MED ORDER — PRAVASTATIN SODIUM 40 MG PO TABS
40.0000 mg | ORAL_TABLET | Freq: Every day | ORAL | Status: DC
  Administered 2017-03-07 – 2017-03-08 (×2): 40 mg via ORAL
  Filled 2017-03-07 (×2): qty 1

## 2017-03-07 MED ORDER — NITROGLYCERIN 0.4 MG SL SUBL: 0.4000 mg | SUBLINGUAL_TABLET | SUBLINGUAL | Status: DC | PRN

## 2017-03-07 MED ORDER — ONDANSETRON HCL 4 MG/2ML IJ SOLN: 4.0000 mg | Freq: Four times a day (QID) | INTRAMUSCULAR | Status: DC | PRN

## 2017-03-07 MED ORDER — TAMSULOSIN HCL 0.4 MG PO CAPS
0.4000 mg | ORAL_CAPSULE | Freq: Every day | ORAL | Status: DC
  Administered 2017-03-08 – 2017-03-09 (×2): 0.4 mg via ORAL
  Filled 2017-03-07 (×2): qty 1

## 2017-03-07 MED ORDER — PANTOPRAZOLE SODIUM 40 MG PO TBEC
40.0000 mg | DELAYED_RELEASE_TABLET | Freq: Every day | ORAL | Status: DC
  Administered 2017-03-08 – 2017-03-09 (×2): 40 mg via ORAL
  Filled 2017-03-07 (×2): qty 1

## 2017-03-07 NOTE — Telephone Encounter (Signed)
S/w Dr. Rockey Situ, who recommends to either proceed to ER now for labs and possible DCCV or he can prescribe amiodarone in hopes to regain NSR.  S/w pt's wife. Daughter is there now with her [employer]. Reports rhythm is irregular.  Daughter Naval architect report. Printed and given to Dr. Rockey Situ.   Reviewed by MD who suggests either ER for possible DCCV or prescribing amiodarone. S/w pt and pt's wife (on speaker phone) who have decided to proceed to ER. Notified Elenore Rota, Camera operator of plan and provided CB number if questions.

## 2017-03-07 NOTE — ED Notes (Signed)
Called to give report, floor unable to take report at this time, stated they will call back.

## 2017-03-07 NOTE — Telephone Encounter (Signed)
Pt wife calling stating for about two days he's been having cp along with light headed  States it is not chest pains but more so tightness in his chest  She thinks he may be in Afib  Would like some advise on this

## 2017-03-07 NOTE — H&P (Signed)
[facility] [place] NAME: William Taylor    MR#:  [id number]  DATE OF BIRTH:  1930/02/08  DATE OF ADMISSION:  03/07/2017  PRIMARY CARE PHYSICIAN: Coral Spikes, DO   REQUESTING/REFERRING PHYSICIAN: Corky Downs, MD  CHIEF COMPLAINT:   Chief Complaint  Patient presents with  . Irregular Heart Beat    HISTORY OF PRESENT ILLNESS:  William Taylor  is a 83 y.o. male who presents with Chest pain for the past 2 days. Patient states that this pain is exacerbated when he is upright, and then it is a pressure-like sensation in his chest. He does have a history of heart attack in the past. Initial workup here in the ED is within normal limits, but with the persistence of his symptoms hospitalists were called for admission and further evaluation  PAST MEDICAL HISTORY:   Past Medical History:  Diagnosis Date  . Cancer [facility])    Prostate, followed by Dr. Jacqlyn Larsen  . Colon polyp   . Coronary artery disease   . Hyperlipidemia   . Hypertension   . MI (myocardial infarction) ([place])    2015  . PAF (paroxysmal atrial fibrillation) ([place])    a. on xarelto  . Skin cancer     PAST SURGICAL HISTORY:   Past Surgical History:  Procedure Laterality Date  . CARDIAC CATHETERIZATION  10/2013   [facility]  . CATARACT EXTRACTION  Oct. 3, 2012   right eye  . colonoscopy    . COLONOSCOPY W/ POLYPECTOMY  2015   Dr Rayann Heman  . COLONOSCOPY WITH PROPOFOL N/A 08/15/2016   Procedure: COLONOSCOPY WITH PROPOFOL;  Surgeon: [name], MD;  Location: [facility] ENDOSCOPY;  Service: Endoscopy;  Laterality: N/A;  . CORONARY ANGIOPLASTY  2009   2005; s/p stent  . ESOPHAGOGASTRODUODENOSCOPY (EGD) WITH PROPOFOL N/A 08/15/2016   Procedure: ESOPHAGOGASTRODUODENOSCOPY (EGD) WITH PROPOFOL;  Surgeon: [name], MD;  Location: [facility] ENDOSCOPY;  Service: Endoscopy;  Laterality: N/A;  . GIVENS CAPSULE STUDY N/A 09/26/2016   Procedure: GIVENS CAPSULE STUDY;  Surgeon: [name], MD;  Location: [facility]  ENDOSCOPY;  Service: Endoscopy;  Laterality: N/A;  . HERNIA REPAIR  2013  . LUMBAR SPINE SURGERY    . UPPER GI ENDOSCOPY  Sept 2015   Dr Rayann Heman    SOCIAL HISTORY:   Social History  Substance Use Topics  . Smoking status: Never Smoker  . Smokeless tobacco: Never Used  . Alcohol use No    FAMILY HISTORY:   Family History  Problem Relation Age of Onset  . Stroke Mother   . Colon cancer Father     DRUG ALLERGIES:   Allergies  Allergen Reactions  . Bee Venom Swelling  . Sulfa Antibiotics Hives and Swelling  . Warfarin And Related Other (See Comments)    Chest pain    MEDICATIONS AT HOME:   Prior to Admission medications   Medication Sig Start Date End Date Taking? Authorizing Provider  diltiazem (CARDIZEM CD) 240 MG 24 hr capsule TAKE 1 CAPSULE DAILY 02/03/17  Yes Cook, Jayce G, DO  esomeprazole (NEXIUM) 40 MG capsule TAKE 1 CAPSULE DAILY AT 12 NOON 06/28/16  Yes Cook, Jayce G, DO  fenofibrate micronized (ANTARA) 130 MG capsule TAKE 1 CAPSULE DAILY BEFORE BREAKFAST 03/06/17  Yes Cook, Jayce G, DO  finasteride (PROSCAR) 5 MG tablet Take 5 mg by mouth daily.     Yes [provider]  isosorbide mononitrate (IMDUR) 30 MG 24 hr tablet Take 1 tablet (30  mg total) by mouth daily. 10/23/16  Yes Gollan, Kathlene November, MD  levobunolol (BETAGAN) 0.5 % ophthalmic solution Place 1 drop into the right eye 2 (two) times daily.    Yes [provider]  lisinopril (PRINIVIL,ZESTRIL) 5 MG tablet Take 1 tablet (5 mg total) by mouth daily. 10/23/16  Yes Gollan, Kathlene November, MD  nitroGLYCERIN (NITROSTAT) 0.4 MG SL tablet DISSOLVE UNDER THE TONGUE 1 TABLET EVERY 5 MINUTES AS NEEDED FOR CHEST PAIN 06/11/16  Yes Gollan, Kathlene November, MD  Omega-3 Fatty Acids (FISH OIL) 1000 MG CAPS Take 1,000 mg by mouth daily.   Yes [provider]  pravastatin (PRAVACHOL) 40 MG tablet Take 1 tablet (40 mg total) by mouth at bedtime. 09/11/16  Yes Cook, Jayce G, DO  Tamsulosin HCl (FLOMAX) 0.4 MG CAPS Take  0.4 mg by mouth daily.   Yes [provider]  XARELTO 20 MG TABS tablet TAKE 1 TABLET DAILY 07/22/16  Yes Gollan, Kathlene November, MD  cyproheptadine (PERIACTIN) 4 MG tablet Take 1 tablet (4 mg total) by mouth 3 (three) times daily as needed for allergies. Patient not taking: Reported on 03/07/2017 05/18/15   [name], MD  ferrous sulfate 325 (65 FE) MG tablet Take 1 tablet (325 mg total) by mouth 2 (two) times daily with a meal. Patient not taking: Reported on 03/07/2017 10/17/16 03/07/17  [name], MD  fluticasone [facility]) 50 MCG/ACT nasal spray Place 2 sprays into both nostrils daily. Patient not taking: Reported on 12/26/2016 10/23/16   Coral Spikes, DO  metoprolol tartrate (LOPRESSOR) 25 MG tablet Take 25 mg by mouth 2 (two) times daily.    [provider]    REVIEW OF SYSTEMS:  Review of Systems  Constitutional: Negative for chills, fever, malaise/fatigue and weight loss.  HENT: Negative for ear pain, hearing loss and tinnitus.   Eyes: Negative for blurred vision, double vision, pain and redness.  Respiratory: Negative for cough, hemoptysis and shortness of breath.   Cardiovascular: Positive for chest pain. Negative for palpitations, orthopnea and leg swelling.  Gastrointestinal: Negative for abdominal pain, constipation, diarrhea, nausea and vomiting.  Genitourinary: Negative for dysuria, frequency and hematuria.  Musculoskeletal: Negative for back pain, joint pain and neck pain.  Skin:       No acne, rash, or lesions  Neurological: Negative for dizziness, tremors, focal weakness and weakness.  Endo/Heme/Allergies: Negative for polydipsia. Does not bruise/bleed easily.  Psychiatric/Behavioral: Negative for depression. The patient is not nervous/anxious and does not have insomnia.      VITAL SIGNS:   Vitals:   03/07/17 1300 03/07/17 1318 03/07/17 1330 03/07/17 1449  BP: 131/74 131/74 103/70 114/70  Pulse: 63 (!) 57 (!) 55 (!) 52  Resp: 11 20 13 20   Temp:       TempSrc:      SpO2: 99% 97% 96% 96%   Wt Readings from Last 3 Encounters:  12/26/16 104.6 kg (230 lb 8 oz)  12/10/16 104.3 kg (230 lb)  10/14/16 103.6 kg (228 lb 8 oz)    PHYSICAL EXAMINATION:  Physical Exam  Vitals reviewed. Constitutional: He is oriented to person, place, and time. He appears well-developed and well-nourished. No distress.  HENT:  Head: Normocephalic and atraumatic.  Mouth/Throat: Oropharynx is clear and moist.  Eyes: Pupils are equal, round, and reactive to light. Conjunctivae and EOM are normal. No scleral icterus.  Neck: Normal range of motion. Neck supple. No JVD present. No thyromegaly present.  Cardiovascular: Normal rate and intact distal  pulses.  Exam reveals no gallop and no friction rub.   No murmur heard. Irregular rhythm  Respiratory: Effort normal and breath sounds normal. No respiratory distress. He has no wheezes. He has no rales.  GI: Soft. Bowel sounds are normal. He exhibits no distension. There is no tenderness.  Musculoskeletal: Normal range of motion. He exhibits no edema.  No arthritis, no gout  Lymphadenopathy:    He has no cervical adenopathy.  Neurological: He is alert and oriented to person, place, and time. No cranial nerve deficit.  No dysarthria, no aphasia  Skin: Skin is warm and dry. No rash noted. No erythema.  Psychiatric: He has a normal mood and affect. His behavior is normal. Judgment and thought content normal.    LABORATORY PANEL:   CBC  Recent Labs Lab 03/07/17 1240  WBC 7.3  HGB 13.9  HCT 40.3  PLT 220   ------------------------------------------------------------------------------------------------------------------  Chemistries   Recent Labs Lab 03/07/17 1240  NA 138  K 4.5  CL 106  CO2 25  GLUCOSE 135*  BUN 20  CREATININE 1.26*  CALCIUM 8.9  AST 27  ALT 16*  ALKPHOS 38  BILITOT 1.0    ------------------------------------------------------------------------------------------------------------------  Cardiac Enzymes  Recent Labs Lab 03/07/17 1240  TROPONINI <0.03   ------------------------------------------------------------------------------------------------------------------  RADIOLOGY:  Dg Chest Portable 1 View  Result Date: 03/07/2017 CLINICAL DATA:  Chest pressure. EXAM: PORTABLE CHEST 1 VIEW COMPARISON:  01/14/2016 chest radiograph FINDINGS: Stable normal cardiac silhouette. Aortic atherosclerosis with calcification. Clear lungs. No pleural effusion or pneumothorax. Bones are unremarkable. IMPRESSION: No acute pulmonary process identified. Electronically Signed   By: Kristine Garbe M.D.   On: 03/07/2017 13:33    EKG:   Orders placed or performed during the hospital encounter of 03/07/17  . EKG 12-Lead  . EKG 12-Lead    IMPRESSION AND PLAN:  Principal Problem:   Chest pain - unclear etiology at this time. We'll trend his cardiac enzymes tonight, get an echocardiogram in the morning and a cardiology consult Active Problems:   ATRIAL FIBRILLATION - continue rate controlling medications and anticoagulation   CAD (coronary artery disease) - continue regular home meds for this, other workup as above   Hyperlipidemia - continue home dose anti-lipids   Chronic kidney disease (CKD), stage III (moderate) - stable, avoid nephrotoxins and monitor  All the records are reviewed and case discussed with ED provider. Management plans discussed with the patient and/or family.  DVT PROPHYLAXIS: Systemic anticoagulation  GI PROPHYLAXIS: PPI  ADMISSION STATUS: Observation  CODE STATUS: Full Code Status History    Date Active Date Inactive Code Status Order ID Comments User Context   07/06/2015 10:25 PM 07/07/2015  7:14 PM Full Code [id number]  Hower, Aaron Mose, MD ED      TOTAL TIME TAKING CARE OF THIS PATIENT: 40 minutes.   Lorelei Heikkila  [place] 03/07/2017, 3:44 PM  [facility]  Office  [phone]  CC: Primary care physician; Coral Spikes, DO  Note:  This document was prepared using Dragon voice recognition software and may include unintentional dictation errors.

## 2017-03-07 NOTE — ED Notes (Signed)
Lab called by Nellie RN for lab results

## 2017-03-07 NOTE — ED Triage Notes (Signed)
Pt to ed with c/o irregular heart rate and rapid heart rate.  Pt sent from dr gollan's office for ? Cardioversion for a fib.  Pt denies chest pain, reports mild sob.  Pt alert and oriented, skin warm and dry.

## 2017-03-07 NOTE — ED Provider Notes (Signed)
[facility] Emergency Department Provider Note   ____________________________________________    I have reviewed the triage vital signs and the nursing notes.   HISTORY  Chief Complaint Irregular Heart Beat     HPI William Taylor is a 83 y.o. male who presents with complaints of chest pressure and irregular heart rate. Patient notes over the last 2-3 days he has had chest pressure in her central chest worse with any exertion. He denies shortness of breath. Today he felt dizzy as well and checked his pulse and found it to be irregular. He does have a distant history of atrial fibrillation but have been well controlled on amiodarone and his heart rate has been regular for the last 2 years. He does have a history of coronary artery disease. He is on Xarelto. He called his cardiologist's office and they recommended he come to the emergency department.   Past Medical History:  Diagnosis Date  . Cancer [facility])    Prostate, followed by Dr. Jacqlyn Larsen  . Colon polyp   . Coronary artery disease   . Hyperlipidemia   . Hypertension   . MI (myocardial infarction) ([place])    2015  . PAF (paroxysmal atrial fibrillation) ([place])    a. on xarelto  . Skin cancer     Patient Active Problem List   Diagnosis Date Noted  . Angiodysplasia of intestinal tract   . Iron deficiency anemia   . Benign neoplasm of ascending colon   . Diverticulosis of large intestine without diverticulitis   . Gastric polyp   . CHF (congestive heart failure) ([place]) 07/30/2016  . Left rotator cuff tear arthropathy 09/11/2015  . External hemorrhoids 09/23/2013  . Prostate cancer ([place]) 03/15/2013  . Insomnia 09/03/2012  . Hypertension 07/23/2012  . Chronic kidney disease (CKD), stage III (moderate) 07/23/2011  . Pulmonary embolism ([place]) 01/09/2011  . ATRIAL FIBRILLATION 09/06/2010  . Hyperlipidemia 05/19/2009  . CAD (coronary artery disease) 05/19/2009    Past Surgical History:  Procedure  Laterality Date  . CARDIAC CATHETERIZATION  10/2013   [facility]  . CATARACT EXTRACTION  Oct. 3, 2012   right eye  . colonoscopy    . COLONOSCOPY W/ POLYPECTOMY  2015   Dr Rayann Heman  . COLONOSCOPY WITH PROPOFOL N/A 08/15/2016   Procedure: COLONOSCOPY WITH PROPOFOL;  Surgeon: [name], MD;  Location: [facility] ENDOSCOPY;  Service: Endoscopy;  Laterality: N/A;  . CORONARY ANGIOPLASTY  2009   2005; s/p stent  . ESOPHAGOGASTRODUODENOSCOPY (EGD) WITH PROPOFOL N/A 08/15/2016   Procedure: ESOPHAGOGASTRODUODENOSCOPY (EGD) WITH PROPOFOL;  Surgeon: [name], MD;  Location: [facility] ENDOSCOPY;  Service: Endoscopy;  Laterality: N/A;  . GIVENS CAPSULE STUDY N/A 09/26/2016   Procedure: GIVENS CAPSULE STUDY;  Surgeon: [name], MD;  Location: [facility] ENDOSCOPY;  Service: Endoscopy;  Laterality: N/A;  . HERNIA REPAIR  2013  . LUMBAR SPINE SURGERY    . UPPER GI ENDOSCOPY  Sept 2015   Dr Rayann Heman    Prior to Admission medications   Medication Sig Start Date End Date Taking? Authorizing Provider  cyproheptadine (PERIACTIN) 4 MG tablet Take 1 tablet (4 mg total) by mouth 3 (three) times daily as needed for allergies. 05/18/15   [name], MD  diltiazem (CARDIZEM CD) 240 MG 24 hr capsule TAKE 1 CAPSULE DAILY 02/03/17   Thersa Salt G, DO  esomeprazole (NEXIUM) 40 MG capsule TAKE 1 CAPSULE DAILY AT 12 NOON 06/28/16   Cook, Jayce G, DO  fenofibrate micronized (ANTARA) 130 MG  capsule TAKE 1 CAPSULE DAILY BEFORE BREAKFAST 03/06/17   Thersa Salt G, DO  ferrous sulfate 325 (65 FE) MG tablet Take 1 tablet (325 mg total) by mouth 2 (two) times daily with a meal. 10/17/16 12/18/16  [name], MD  finasteride (PROSCAR) 5 MG tablet Take 5 mg by mouth daily.      [provider]  fluticasone (FLONASE) 50 MCG/ACT nasal spray Place 2 sprays into both nostrils daily. Patient not taking: Reported on 12/26/2016 10/23/16   Coral Spikes, DO  isosorbide mononitrate (IMDUR) 30 MG 24 hr tablet Take 1 tablet (30 mg total) by mouth daily.  10/23/16   [name], MD  levobunolol (BETAGAN) 0.5 % ophthalmic solution 1 drop 2 (two) times daily.     [provider]  lisinopril (PRINIVIL,ZESTRIL) 5 MG tablet Take 1 tablet (5 mg total) by mouth daily. 10/23/16   [name], MD  metoprolol tartrate (LOPRESSOR) 25 MG tablet Take 25 mg by mouth 2 (two) times daily.    [provider]  nitroGLYCERIN (NITROSTAT) 0.4 MG SL tablet DISSOLVE UNDER THE TONGUE 1 TABLET EVERY 5 MINUTES AS NEEDED FOR CHEST PAIN 06/11/16   [name], MD  Omega-3 Fatty Acids (FISH OIL) 1000 MG CAPS Take 1,000 mg by mouth daily.    [provider]  pravastatin (PRAVACHOL) 40 MG tablet Take 1 tablet (40 mg total) by mouth at bedtime. 09/11/16   Coral Spikes, DO  Tamsulosin HCl (FLOMAX) 0.4 MG CAPS Take 0.4 mg by mouth daily.    [provider]  XARELTO 20 MG TABS tablet TAKE 1 TABLET DAILY 07/22/16   [name], MD     Allergies Bee venom; Sulfa antibiotics; and Warfarin and related  Family History  Problem Relation Age of Onset  . Stroke Mother   . Colon cancer Father     Social History Social History  Substance Use Topics  . Smoking status: Never Smoker  . Smokeless tobacco: Never Used  . Alcohol use No    Review of Systems  Constitutional: No fever/chills Eyes: No visual changes.  ENT: No sore throat. Cardiovascular: As above Respiratory: Denies shortness of breath. Gastrointestinal: No abdominal pain.  No nausea, no vomiting.   Genitourinary: Negative for dysuria. Musculoskeletal: Negative for back pain. Skin: Negative for rash. Neurological: Negative for headaches    ____________________________________________   PHYSICAL EXAM:  VITAL SIGNS: ED Triage Vitals [03/07/17 1232]  Enc Vitals Group     BP (!) 133/100     Pulse Rate 69     Resp 18     Temp (!) 97.5 F (36.4 C)     Temp Source Oral     SpO2 98 %     Weight      Height      Head Circumference      Peak Flow       Pain Score      Pain Loc      Pain Edu?      Excl. in [place]?     Constitutional: Alert and oriented. No acute distress. Pleasant and interactive Eyes: Conjunctivae are normal.   Nose: No congestion/rhinnorhea. Mouth/Throat: Mucous membranes are moist.    Cardiovascular: Normal rate, Irregularly irregular rhythm. Grossly normal heart sounds.  Good peripheral circulation. Respiratory: Normal respiratory effort.  No retractions. Lungs CTAB. Gastrointestinal: Soft and nontender. No distention.  No CVA tenderness. Genitourinary: deferred Musculoskeletal: No lower extremity tenderness nor edema.  Warm and well  perfused Neurologic:  Normal speech and language. No gross focal neurologic deficits are appreciated.  Skin:  Skin is warm, dry and intact. No rash noted. Psychiatric: Mood and affect are normal. Speech and behavior are normal.  ____________________________________________   LABS (all labs ordered are listed, but only abnormal results are displayed)  Labs Reviewed  COMPREHENSIVE METABOLIC PANEL - Abnormal; Notable for the following:       Result Value   Glucose, Bld 135 (*)    Creatinine, Ser 1.26 (*)    ALT 16 (*)    GFR calc non Af Amer 50 (*)    GFR calc Af Amer 58 (*)    All other components within normal limits  TROPONIN I  CBC   ____________________________________________  EKG  ED ECG REPORT I, Lavonia Drafts, the attending physician, personally viewed and interpreted this ECG.  Date: 03/07/2017 EKG Time: 12:30 PM Rate: 61 Rhythm: Atrial fibrillation QRS Axis: normal  ST/T Wave abnormalities: normal   ____________________________________________  RADIOLOGY  Chest x-ray benign ____________________________________________   PROCEDURES  Procedure(s) performed: No    Critical Care performed: No ____________________________________________   INITIAL IMPRESSION / ASSESSMENT AND PLAN / ED COURSE  Pertinent labs & imaging results that were  available during my care of the patient were reviewed by me and considered in my medical decision making (see chart for details).  Patient well-appearing and in no acute distress. EKG does demonstrate atrial fibrillation however normal rate. Vital signs are overall reassuring. We will check enzymes, chest x-ray and reevaluate   ----------------------------------------- 2:33 PM on 03/07/2017 -----------------------------------------  Patient remains chest pain-free while lying down and resting, enzymes are normal. However when he gets up he still feels pressure in his chest. I will admit for observation and further evaluation  ____________________________________________   FINAL CLINICAL IMPRESSION(S) / ED DIAGNOSES  Final diagnoses:  Chest pain, unspecified type      NEW MEDICATIONS STARTED DURING THIS VISIT:  New Prescriptions   No medications on file     Note:  This document was prepared using Dragon voice recognition software and may include unintentional dictation errors.    Lavonia Drafts, MD 03/07/17 1434

## 2017-03-07 NOTE — Telephone Encounter (Signed)
Pt's wife states pt has been having 6 out of 10 chest pressure in the center of his chest and lightheadedness for 2 days Pressure is almost constant.  Denies arm or jaw pain, nausea, vomiting, diaphoresis. Pulse 60s-80s. BP 122/72. He takes dilitiazem, lisinopril, metoprolol and xarelto. No missed doses. While on the phone, I asked him to check his pulse manually. He reports and irregular pattern. States Dr. Rockey Situ told him to call the office instead of going to ED if any issues. Will route to Dr. Rockey Situ and discuss with him now.

## 2017-03-08 ENCOUNTER — Other Ambulatory Visit: Payer: Self-pay | Admitting: Cardiovascular Disease

## 2017-03-08 ENCOUNTER — Observation Stay
Admit: 2017-03-08 | Discharge: 2017-03-08 | Disposition: A | Discharge status: Home / Self Care | Payer: Medicare Other | Attending: Internal Medicine | Admitting: Internal Medicine

## 2017-03-08 DIAGNOSIS — I480 Paroxysmal atrial fibrillation: Secondary | ICD-10-CM | POA: Diagnosis not present

## 2017-03-08 DIAGNOSIS — R079 Chest pain, unspecified: Secondary | ICD-10-CM

## 2017-03-08 DIAGNOSIS — I951 Orthostatic hypotension: Secondary | ICD-10-CM

## 2017-03-08 DIAGNOSIS — I2510 Atherosclerotic heart disease of native coronary artery without angina pectoris: Secondary | ICD-10-CM | POA: Diagnosis not present

## 2017-03-08 DIAGNOSIS — E782 Mixed hyperlipidemia: Secondary | ICD-10-CM | POA: Diagnosis not present

## 2017-03-08 DIAGNOSIS — N183 Chronic kidney disease, stage 3 (moderate): Secondary | ICD-10-CM | POA: Diagnosis not present

## 2017-03-08 DIAGNOSIS — I952 Hypotension due to drugs: Secondary | ICD-10-CM | POA: Diagnosis not present

## 2017-03-08 DIAGNOSIS — I25118 Atherosclerotic heart disease of native coronary artery with other forms of angina pectoris: Secondary | ICD-10-CM | POA: Diagnosis not present

## 2017-03-08 LAB — ECHOCARDIOGRAM COMPLETE
Area-P 1/2: 3.73 cm2
E decel time: 201 msec
EERAT: 7.57
FS: 51 % — AB (ref 28–44)
HEIGHTINCHES: 76 in
IVS/LV PW RATIO, ED: 1.01
LA ID, A-P, ES: 43 mm
LA diam end sys: 43 mm
LA diam index: 1.79 cm/m2
LAVOL: 79.1 mL
LAVOLA4C: 77.8 mL
LAVOLIN: 33 mL/m2
LV E/e'average: 7.57
LV TDI E'LATERAL: 7.62
LV TDI E'MEDIAL: 6.4
LV e' LATERAL: 7.62 cm/s
LVEEMED: 7.57
Lateral S' vel: 11 cm/s
MV Dec: 201
MV pk A vel: 103 m/s
MVPKEVEL: 57.7 m/s
MVSPHT: 59 ms
PW: 11.5 mm — AB (ref 0.6–1.1)
RV TAPSE: 27.2 mm
WEIGHTICAEL: 3731.2 [oz_av]

## 2017-03-08 LAB — CBC
HCT: 37.6 % — ABNORMAL LOW (ref 40.0–52.0)
HEMOGLOBIN: 12.9 g/dL — AB (ref 13.0–18.0)
MCH: 32.9 pg (ref 26.0–34.0)
MCHC: 34.2 g/dL (ref 32.0–36.0)
MCV: 96 fL (ref 80.0–100.0)
Platelets: 192 10*3/uL (ref 150–440)
RBC: 3.92 MIL/uL — ABNORMAL LOW (ref 4.40–5.90)
RDW: 13 % (ref 11.5–14.5)
WBC: 7.6 10*3/uL (ref 3.8–10.6)

## 2017-03-08 LAB — BASIC METABOLIC PANEL
ANION GAP: 7 (ref 5–15)
BUN: 20 mg/dL (ref 6–20)
CO2: 26 mmol/L (ref 22–32)
CREATININE: 1.27 mg/dL — AB (ref 0.61–1.24)
Calcium: 9.1 mg/dL (ref 8.9–10.3)
Chloride: 106 mmol/L (ref 101–111)
GFR calc Af Amer: 58 mL/min — ABNORMAL LOW (ref >60)
GFR, EST NON AFRICAN AMERICAN: 50 mL/min — AB (ref >60)
GLUCOSE: 108 mg/dL — AB (ref 65–99)
Potassium: 3.7 mmol/L (ref 3.5–5.1)
Sodium: 139 mmol/L (ref 135–145)

## 2017-03-08 LAB — TROPONIN I

## 2017-03-08 MED ORDER — AMIODARONE HCL 200 MG PO TABS: 200.0000 mg | ORAL_TABLET | Freq: Every day | ORAL | 2 refills | Status: DC | PRN

## 2017-03-08 MED ORDER — DILTIAZEM HCL ER COATED BEADS 120 MG PO CP24
120.0000 mg | ORAL_CAPSULE | Freq: Every day | ORAL | Status: DC
  Administered 2017-03-09: 120 mg via ORAL
  Filled 2017-03-08: qty 1

## 2017-03-08 MED ORDER — SODIUM CHLORIDE 0.9 % IV BOLUS (SEPSIS)
500.0000 mL | Freq: Once | INTRAVENOUS | Status: AC
  Administered 2017-03-08: 500 mL via INTRAVENOUS

## 2017-03-08 NOTE — Care Management Obs Status (Signed)
[place] NOTIFICATION   Patient Details  Name: William Taylor MRN: [id number] Date of Birth: May 23, 1930   Medicare Observation Status Notification Given:  Yes [facility] letter)    Mardene Speak, RN 03/08/2017, 4:43 PM

## 2017-03-08 NOTE — Discharge Summary (Signed)
William Taylor, is a 83 y.o. male  DOB 1929-11-25  MRN [id number].  Admission date:  03/07/2017  Admitting Physician  Lance Coon, MD  Discharge Date:  03/08/2017   Primary MD  Coral Spikes, DO  Recommendations for primary care physician for things to follow:  Follow with pcp in one week    Admission Diagnosis  Chest pain, unspecified type [R07.9]   Discharge Diagnosis  Chest pain, unspecified type [R07.9]   Principal Problem:   Chest pain Active Problems:   Hyperlipidemia   CAD (coronary artery disease)   ATRIAL FIBRILLATION   Chronic kidney disease (CKD), stage III (moderate)      Past Medical History:  Diagnosis Date  . Cancer [facility])    Prostate, followed by Dr. Jacqlyn Larsen  . William polyp   . Coronary artery disease   . Hyperlipidemia   . Hypertension   . MI (myocardial infarction) ([place])    2015  . PAF (paroxysmal atrial fibrillation) ([place])    a. on xarelto  . Skin cancer     Past Surgical History:  Procedure Laterality Date  . CARDIAC CATHETERIZATION  10/2013   [facility]  . CATARACT EXTRACTION  Oct. 3, 2012   right eye  . colonoscopy    . COLONOSCOPY W/ POLYPECTOMY  2015   Dr Rayann Heman  . COLONOSCOPY WITH PROPOFOL N/A 08/15/2016   Procedure: COLONOSCOPY WITH PROPOFOL;  Surgeon: [name], MD;  Location: [facility] ENDOSCOPY;  Service: Endoscopy;  Laterality: N/A;  . CORONARY ANGIOPLASTY  2009   2005; s/p stent  . ESOPHAGOGASTRODUODENOSCOPY (EGD) WITH PROPOFOL N/A 08/15/2016   Procedure: ESOPHAGOGASTRODUODENOSCOPY (EGD) WITH PROPOFOL;  Surgeon: [name], MD;  Location: [facility] ENDOSCOPY;  Service: Endoscopy;  Laterality: N/A;  . GIVENS CAPSULE STUDY N/A 09/26/2016   Procedure: GIVENS CAPSULE STUDY;  Surgeon: [name], MD;  Location: [facility] ENDOSCOPY;  Service: Endoscopy;  Laterality: N/A;  . HERNIA REPAIR  2013  .  LUMBAR SPINE SURGERY    . UPPER GI ENDOSCOPY  Sept 2015   Dr Rayann Heman       History of present illness and  Hospital Course:     Kindly see H&P for history of present illness and admission details, please review complete Labs, Consult reports and Test reports for all details in brief  HPI  from the history and physical done on the day of admission  83 year old male patient admitted for chest pain, irregular heartbeat. Admitted to telemetry for evaluation of acute MI.  Hospital Course   Atypical chest pain likely musculoskeletal. Troponin is negative for 3 times. Seen by cardiology. Patient to is going to get echocardiogram if echo is within normal limits patient can be discharged home. #2. Paroxysmal  atrial fibrillation: Seen by Dr. Esmond Plants recommended to continue his home medicines including metoprolol, Cardizem, Xarelto.. Amiodarone  will be called in for breakthrough palpitations as per cardiology recommendation  Dizziness: Check orthostatic vitals, and encourage  fluid intake to prevent dehydration. #4 .hyperlipidemia; continue her medicines. #5. history of prostate cancer: Continue home medicines.  Discharge Condition:stable   Follow UP      Discharge Instructions  and  Discharge Medications     Allergies as of 03/08/2017      Reactions   Bee Venom Swelling   Sulfa Antibiotics Hives, Swelling   Warfarin And Related Other (See Comments)   Chest pain      Medication List    TAKE these medications   cyproheptadine 4 MG tablet Commonly known  as:  PERIACTIN Take 1 tablet (4 mg total) by mouth 3 (three) times daily as needed for allergies.   diltiazem 240 MG 24 hr capsule Commonly known as:  CARDIZEM CD TAKE 1 CAPSULE DAILY   esomeprazole 40 MG capsule Commonly known as:  NEXIUM TAKE 1 CAPSULE DAILY AT 12 NOON   fenofibrate micronized 130 MG capsule Commonly known as:  ANTARA TAKE 1 CAPSULE DAILY BEFORE BREAKFAST   ferrous sulfate 325 (65 FE) MG  tablet Take 1 tablet (325 mg total) by mouth 2 (two) times daily with a meal.   finasteride 5 MG tablet Commonly known as:  PROSCAR Take 5 mg by mouth daily.   Fish Oil 1000 MG Caps Take 1,000 mg by mouth daily.   fluticasone 50 MCG/ACT nasal spray Commonly known as:  FLONASE Place 2 sprays into both nostrils daily.   isosorbide mononitrate 30 MG 24 hr tablet Commonly known as:  IMDUR Take 1 tablet (30 mg total) by mouth daily.   levobunolol 0.5 % ophthalmic solution Commonly known as:  BETAGAN Place 1 drop into the right eye 2 (two) times daily.   lisinopril 5 MG tablet Commonly known as:  PRINIVIL,ZESTRIL Take 1 tablet (5 mg total) by mouth daily.   metoprolol tartrate 25 MG tablet Commonly known as:  LOPRESSOR Take 25 mg by mouth 2 (two) times daily.   nitroGLYCERIN 0.4 MG SL tablet Commonly known as:  NITROSTAT DISSOLVE UNDER THE TONGUE 1 TABLET EVERY 5 MINUTES AS NEEDED FOR CHEST PAIN   pravastatin 40 MG tablet Commonly known as:  PRAVACHOL Take 1 tablet (40 mg total) by mouth at bedtime.   tamsulosin 0.4 MG Caps capsule Commonly known as:  FLOMAX Take 0.4 mg by mouth daily.   XARELTO 20 MG Tabs tablet Generic drug:  rivaroxaban TAKE 1 TABLET DAILY         Diet and Activity recommendation: See Discharge Instructions above   Consults obtained -cardiology   Major procedures and Radiology Reports - PLEASE review detailed and final reports for all details, in brief -      Dg Chest Portable 1 View  Result Date: 03/07/2017 CLINICAL DATA:  Chest pressure. EXAM: PORTABLE CHEST 1 VIEW COMPARISON:  01/14/2016 chest radiograph FINDINGS: Stable normal cardiac silhouette. Aortic atherosclerosis with calcification. Clear lungs. No pleural effusion or pneumothorax. Bones are unremarkable. IMPRESSION: No acute pulmonary process identified. Electronically Signed   By: Kristine Garbe M.D.   On: 03/07/2017 13:33    Micro Results     No results found  for this or any previous visit (from the past 240 hour(s)).     Today   Subjective:   William Taylor today has no headache,no chest abdominal pain,no new weakness tingling or numbness, feels much better wants to go home today.   Objective:   Blood pressure (!) 147/76, pulse 61, temperature 97.9 F (36.6 C), temperature source Oral, resp. rate 16, height 6\' 4"  (1.93 m), weight 105.8 kg (233 lb 3.2 oz), SpO2 97 %.   Intake/Output Summary (Last 24 hours) at 03/08/17 1113 Last data filed at 03/08/17 0900  Gross per 24 hour  Intake              240 ml  Output              550 ml  Net             -310 ml    Exam Awake Alert, Oriented x 3, No new F.N deficits,  Normal affect [place].AT,PERRAL Supple Neck,No JVD, No cervical lymphadenopathy appriciated.  Symmetrical Chest wall movement, Good air movement bilaterally, CTAB RRR,No Gallops,Rubs or new Murmurs, No Parasternal Heave +ve B.Sounds, Abd Soft, Non tender, No organomegaly appriciated, No rebound -guarding or rigidity. No Cyanosis, Clubbing or edema, No new Rash or bruise  Data Review   CBC w Diff: Lab Results  Component Value Date   WBC 7.6 03/08/2017   HGB 12.9 (L) 03/08/2017   HGB 10.6 (L) 02/27/2014   HCT 37.6 (L) 03/08/2017   HCT 33.0 (L) 02/27/2014   PLT 192 03/08/2017   PLT 228 02/27/2014   LYMPHOPCT 28 12/10/2016   LYMPHOPCT 6.4 02/22/2014   MONOPCT 9 12/10/2016   MONOPCT 5.5 02/22/2014   EOSPCT 1 12/10/2016   EOSPCT 0.0 02/22/2014   BASOPCT 1 12/10/2016   BASOPCT 0.2 02/22/2014    CMP: Lab Results  Component Value Date   NA 139 03/08/2017   NA 138 02/27/2014   K 3.7 03/08/2017   K 4.1 02/27/2014   CL 106 03/08/2017   CL 106 02/27/2014   CO2 26 03/08/2017   CO2 26 02/27/2014   BUN 20 03/08/2017   BUN 26 (H) 02/27/2014   CREATININE 1.27 (H) 03/08/2017   CREATININE 1.48 (H) 02/27/2014   CREATININE 1.22 12/13/2010   PROT 7.2 03/07/2017   PROT 7.1 02/27/2014   ALBUMIN 4.1 03/07/2017   ALBUMIN 3.3  (L) 02/27/2014   BILITOT 1.0 03/07/2017   BILITOT 0.5 02/27/2014   ALKPHOS 38 03/07/2017   ALKPHOS 37 (L) 02/27/2014   AST 27 03/07/2017   AST 23 02/27/2014   ALT 16 (L) 03/07/2017   ALT 18 02/27/2014  .   Total Time in preparing paper work, data evaluation and todays exam - 65 minutes  Meranda Dechaine M.D on 03/08/2017 at 11:13 AM    Note: This dictation was prepared with Dragon dictation along with smaller phrase technology. Any transcriptional errors that result from this process are unintentional.

## 2017-03-08 NOTE — Consult Note (Signed)
Cardiology Consultation:   Patient ID: NYLE LIMB; [id number]; 1929/10/21   Admit date: 03/07/2017 Date of Consult: 03/08/2017  Primary Care Provider: Coral Spikes, DO Primary Cardiologist: Rockey Situ Consult placed by Dr. Lance Coon for chest pain, atrial fibrillation    Patient Profile:   William Taylor is a 83 y.o. male with a history of  coronary artery disease,  stent in 2005,  atrial fibrillation with RVR in the setting of UTI September 2011,  stent placement May 2012 to the OM 2,  PE following catheterization started on anticoagulation,  atrial fibrillation in 2013,  Anemia, hematocrit of 28 8 months ago Who presents to the hospital with palpitations, chest pain   History of Present Illness:   Mr. Pfiester developed palpitations Friday, July 20, also reported having some chest pain.  He called our office, was given the choice of emergency room for cardioversion as he has been anticoagulated or outpatient treatment with oral amiodarone. He decided to go to the emergency room  Also reports having some dizziness when he stands up. No near syncope or syncope He had atypical chest pain in the past, sharp shooting twinge in the left chest He does have amiodarone at home, did not take this for his atrial fibrillation  as it was expired   history of paroxysmal atrial fibrillation Typically does not have chest pain with exertion , rarely takes nitroglycerin  Emergency notes reviewed and they detail chest pain when sitting up  he reports having chest pain when he rolled over in bed this morning  Previous EGD, colonoscopy, video capsule enteroscopy  He has follow-up with GI in one week, polyps noted  Denies any significant shortness of breath,fatigue with exertion.  Denies any  Significant palpitations concerning for arrhythmia  Total chol 110, LDL 64 HBA1C 6.5  previous injury of his left shoulder, rotator cuff tear   previous episode of chest pain, went  to the emergency room November 2016. Ruled out for MI, had Myoview showing no ischemia. He was noted to have atrial tachycardia with rates up to the 150 range, metoprolol tartrate increased up to 50 mg twice a day.  catheterization in 2009 showing patent stent with no other significant disease, normal systolic function in September 2011 who presented to the emergency room September 2011 with malaise, atrial fibrillation with RVR in the setting of a urinary tract infection, recent admission to the hospital November 14 2010 for atrial fibrillation that developed after pushing mowing with some chest pain. He converted back to normal sinus rhythm,   Presenting to [facility] with chest discomfort with cardiac catheterization Dec 25 2010, showing 90% OM 2 disease, transferred to [facility] with DES stent placed, 2.25 x 16 mm POMUS stent, with flank pain developing after discharge,   admitted to [facility] and diagnosed with pulmonary embolism with heavy clot burden.   Found to be in atrial fibrillation in 2013. He had symptoms of shortness of breath with exertion.  After one month on xarelto, amiodarone load and titration downward was started with conversion to NSR in October 2013  EKG the end of October 2013 confirmed normal sinus rhythm.   History of back surgery in 2008, history of prostate cancer followed by Dr. Jacqlyn Larsen   He was admitted to the hospital 10/27/2013 with chest pain. He had held his Plavix for colonoscopy 10/26/2013 with polypectomy x3. Cardiac catheterization performed given his severe chest pain and no disease showing severe small vessel disease, patent stents Amiodarone was held  for bradycardia, he was started on isosorbide 30 mg daily, Plavix was held, he was continued on aspirin and xarelto.  Cardiac catheterization results; 90% disease at the ostium of a small diagonal vessel, small OM vessel with 80% disease in the proximal region Otherwise stents are patent  Echocardiogram 10/28/2013  shows normal LV function, mildly dilated left atrium, mild aortic regurg, normal right ventricular systolic pressures   Past Medical History:  Diagnosis Date  . Cancer [facility])    Prostate, followed by Dr. Jacqlyn Larsen  . Colon polyp   . Coronary artery disease   . Hyperlipidemia   . Hypertension   . MI (myocardial infarction) ([place])    2015  . PAF (paroxysmal atrial fibrillation) ([place])    a. on xarelto  . Skin cancer     Past Surgical History:  Procedure Laterality Date  . CARDIAC CATHETERIZATION  10/2013   [facility]  . CATARACT EXTRACTION  Oct. 3, 2012   right eye  . colonoscopy    . COLONOSCOPY W/ POLYPECTOMY  2015   Dr Rayann Heman  . COLONOSCOPY WITH PROPOFOL N/A 08/15/2016   Procedure: COLONOSCOPY WITH PROPOFOL;  Surgeon: [name], MD;  Location: [facility] ENDOSCOPY;  Service: Endoscopy;  Laterality: N/A;  . CORONARY ANGIOPLASTY  2009   2005; s/p stent  . ESOPHAGOGASTRODUODENOSCOPY (EGD) WITH PROPOFOL N/A 08/15/2016   Procedure: ESOPHAGOGASTRODUODENOSCOPY (EGD) WITH PROPOFOL;  Surgeon: [name], MD;  Location: [facility] ENDOSCOPY;  Service: Endoscopy;  Laterality: N/A;  . GIVENS CAPSULE STUDY N/A 09/26/2016   Procedure: GIVENS CAPSULE STUDY;  Surgeon: [name], MD;  Location: [facility] ENDOSCOPY;  Service: Endoscopy;  Laterality: N/A;  . HERNIA REPAIR  2013  . LUMBAR SPINE SURGERY    . UPPER GI ENDOSCOPY  Sept 2015   Dr Rayann Heman     Inpatient Medications: Scheduled Meds: . diltiazem  240 mg Oral Daily  . finasteride  5 mg Oral Daily  . isosorbide mononitrate  30 mg Oral Daily  . levobunolol  1 drop Right Eye BID  . lisinopril  5 mg Oral Daily  . pantoprazole  40 mg Oral Daily  . pravastatin  40 mg Oral QHS  . rivaroxaban  20 mg Oral Q supper  . tamsulosin  0.4 mg Oral Daily   Continuous Infusions:  PRN Meds: acetaminophen **OR** acetaminophen, nitroGLYCERIN, ondansetron **OR** ondansetron (ZOFRAN) IV, oxyCODONE  Allergies:    Allergies  Allergen Reactions  . Bee Venom Swelling  . Sulfa  Antibiotics Hives and Swelling  . Warfarin And Related Other (See Comments)    Chest pain    Social History:   Social History   Social History  . Marital status: Married    Spouse name: N/A  . Number of children: N/A  . Years of education: N/A   Occupational History  . Not on file.   Social History Main Topics  . Smoking status: Never Smoker  . Smokeless tobacco: Never Used  . Alcohol use No  . Drug use: No  . Sexual activity: Not on file   Other Topics Concern  . Not on file   Social History Narrative  . No narrative on file    Family History:   The patient's family history includes Colon cancer in his father; Stroke in his mother.  ROS:  Please see the history of present illness.  Review of Systems  Constitution: Negative for diaphoresis, fever, weakness, malaise/fatigue and night sweats.  HENT: Negative.   Eyes: Negative.   Cardiovascular: Positive for chest pain. Negative  for claudication, cyanosis, dyspnea on exertion, irregular heartbeat, leg swelling, near-syncope, orthopnea, palpitations and paroxysmal nocturnal dyspnea.  Respiratory: Negative for cough, shortness of breath, sleep disturbances due to breathing and wheezing.   Endocrine: Negative.   Hematologic/Lymphatic: Negative.   Skin: Negative.   Musculoskeletal: Negative for falls, joint pain, joint swelling and myalgias.  Gastrointestinal: Negative.   Neurological: Negative for difficulty with concentration, excessive daytime sleepiness, dizziness, focal weakness, light-headedness and numbness.  Psychiatric/Behavioral: Negative.     All other ROS reviewed and negative.     Physical Exam/Data:   Vitals:   03/07/17 1812 03/07/17 2014 03/08/17 0441 03/08/17 0800  BP: (!) 154/83 (!) 143/74 132/72 (!) 147/76  Pulse: 61 65 65 61  Resp: 20 16 16 16   Temp: 97.7 F (36.5 C) 97.6 F (36.4 C) 97.9 F (36.6 C) 97.9 F (36.6 C)  TempSrc: Oral Oral Oral Oral  SpO2: 99% 97% 96% 97%  Weight: 233 lb 3.2  oz (105.8 kg)     Height: 6\' 4"  (1.93 m)       Intake/Output Summary (Last 24 hours) at 03/08/17 0919 Last data filed at 03/08/17 0636  Gross per 24 hour  Intake                0 ml  Output              550 ml  Net             -550 ml   Filed Weights   03/07/17 1812  Weight: 233 lb 3.2 oz (105.8 kg)   Body mass index is 28.39 kg/m.  General:  Well nourished, well developed, in no acute distress HEENT: normal Lymph: no adenopathy Neck: no JVD Endocrine:  No thryomegaly Vascular: No carotid bruits; FA pulses 2+ bilaterally without bruits  Cardiac:  normal S1, S2; RRR; no murmur  Lungs:  clear to auscultation bilaterally, no wheezing, rhonchi or rales  Abd: soft, nontender, no hepatomegaly  Ext: no edema and Musculoskeletal:  No deformities, BUE and BLE strength normal and equal Skin: warm and dry  Neuro:  CNs 2-12 intact, no focal abnormalities noted Psych:  Normal affect   EKG:  The EKG was personally reviewed and demonstrates:  Atrial fibrillation with rate 61 bpm no significant ST or T-wave changes  Telemetry:  Telemetry was personally reviewed and demonstrates:  Atrial fibrillation  Relevant CV Studies:   echocardiogram shows normal LV function, no significant valve disease, no acute abnormalities  Laboratory Data:  Chemistry Recent Labs Lab 03/07/17 1240 03/08/17 0033  NA 138 139  K 4.5 3.7  CL 106 106  CO2 25 26  GLUCOSE 135* 108*  BUN 20 20  CREATININE 1.26* 1.27*  CALCIUM 8.9 9.1  GFRNONAA 50* 50*  GFRAA 58* 58*  ANIONGAP 7 7     Recent Labs Lab 03/07/17 1240  PROT 7.2  ALBUMIN 4.1  AST 27  ALT 16*  ALKPHOS 38  BILITOT 1.0   Hematology Recent Labs Lab 03/07/17 1240 03/08/17 0033  WBC 7.3 7.6  RBC 4.12* 3.92*  HGB 13.9 12.9*  HCT 40.3 37.6*  MCV 97.8 96.0  MCH 33.7 32.9  MCHC 34.5 34.2  RDW 13.2 13.0  PLT 220 192   Cardiac Enzymes Recent Labs Lab 03/07/17 1240 03/07/17 1926 03/08/17 0033  TROPONINI <0.03 <0.03 <0.03   No  results for input(s): TROPIPOC in the last 168 hours.  BNPNo results for input(s): BNP, PROBNP in the last 168 hours.  DDimer  No results for input(s): DDIMER in the last 168 hours.  Radiology/Studies:  Dg Chest Portable 1 View  Result Date: 03/07/2017 CLINICAL DATA:  Chest pressure. EXAM: PORTABLE CHEST 1 VIEW COMPARISON:  01/14/2016 chest radiograph FINDINGS: Stable normal cardiac silhouette. Aortic atherosclerosis with calcification. Clear lungs. No pleural effusion or pneumothorax. Bones are unremarkable. IMPRESSION: No acute pulmonary process identified. Electronically Signed   By: Kristine Garbe M.D.   On: 03/07/2017 13:33    Assessment and Plan:   1. Paroxysmal atrial fibrillation He is on anticoagulation , xarelto. Rate on arrival was well controlled but he was symptomatic possibly exacerbated by orthostasis  He converted to normal sinus rhythm overnight, around 6 PM without significant intervention He does have amiodarone at home , he was taking as needed, he did not take that on this occasion,  it was expired  We will send a new prescription for amiodarone 200 mg pills to take as needed  If he starts to have more frequent episodes of atrial fibrillation we would recommend he take amiodarone on a regular basis. We will monitor for now ---Continue metoprolol at current dose  --- given his orthostasis , recommended we decrease diltiazem down to 120 mg daily  He will need to increase his fluid intake   2) chest pain Atypical in nature He ruled out for MI, this is not a non-STEMI Continue current outpatient regiment Suspect musculoskeletal Recommended ice, rare use of NSAIDs as he is on anticoagulation   he reports lifting a heavy bedframe yesterday.  Wife concerned this may have contributed to his symptoms   3)Orthostasis Systolic pressure  dropped with sitting 130 down to 87   he had received his diltiazem 240 mg dose with metoprolol Recommend we decrease diltiazem  down to 120 mg daily He does not drink significant fluids at home. We have recommended he try to drink more We can give 500 cc bolus of normal saline here with recheck of his orthostatics  4) chronic kidney disease Creatinine stable     Total encounter time more than 110 minutes  Greater than 50% was spent in counseling and coordination of care with the patient   Signed, Ida Rogue, MD  03/08/2017 9:19 AM

## 2017-03-09 DIAGNOSIS — I25118 Atherosclerotic heart disease of native coronary artery with other forms of angina pectoris: Secondary | ICD-10-CM

## 2017-03-09 DIAGNOSIS — I480 Paroxysmal atrial fibrillation: Secondary | ICD-10-CM | POA: Diagnosis not present

## 2017-03-09 DIAGNOSIS — I952 Hypotension due to drugs: Secondary | ICD-10-CM | POA: Diagnosis not present

## 2017-03-09 DIAGNOSIS — R079 Chest pain, unspecified: Secondary | ICD-10-CM | POA: Diagnosis not present

## 2017-03-09 MED ORDER — DILTIAZEM HCL ER COATED BEADS 120 MG PO CP24: 120.0000 mg | ORAL_CAPSULE | Freq: Every day | ORAL | 0 refills | Status: DC

## 2017-03-09 NOTE — Progress Notes (Signed)
Progress Note  Patient Name: William Taylor Date of Encounter: 03/09/2017  Primary Cardiologist: Rockey Situ  Subjective   Reports he feels well, Rare sharp chest pain, does not last Slept well Minimal dizziness getting up to go to the bathroom (" just a little") Telemetry reviewed showing no significant arrhythmia  Inpatient Medications    Scheduled Meds: . diltiazem  120 mg Oral Daily  . finasteride  5 mg Oral Daily  . levobunolol  1 drop Right Eye BID  . lisinopril  5 mg Oral Daily  . pantoprazole  40 mg Oral Daily  . pravastatin  40 mg Oral QHS  . rivaroxaban  20 mg Oral Q supper  . tamsulosin  0.4 mg Oral Daily   Continuous Infusions:  PRN Meds: acetaminophen **OR** acetaminophen, nitroGLYCERIN, ondansetron **OR** ondansetron (ZOFRAN) IV, oxyCODONE   Vital Signs    Vitals:   03/09/17 1117 03/09/17 1118 03/09/17 1119 03/09/17 1122  BP: 136/80 120/75 111/60 122/71  Pulse: 68 78 96 (!) 106  Resp: 18     Temp: (!) 97.5 F (36.4 C)     TempSrc: Oral     SpO2: 98%     Weight:      Height:        Intake/Output Summary (Last 24 hours) at 03/09/17 1348 Last data filed at 03/09/17 0900  Gross per 24 hour  Intake              480 ml  Output                0 ml  Net              480 ml   Filed Weights   03/07/17 1812  Weight: 233 lb 3.2 oz (105.8 kg)    Telemetry    Normal sinus rhythm with no significant atrial fibrillation- Personally Reviewed  ECG      Physical Exam   GEN: No acute distress, Tall, no distress   Neck: No JVD Cardiac: RRR, no murmurs, rubs, or gallops.  Respiratory: Clear to auscultation bilaterally. GI: Soft, nontender, non-distended  MS: No edema; No deformity. Neuro:  Nonfocal  Psych: Normal affect   Labs    Chemistry Recent Labs Lab 03/07/17 1240 03/08/17 0033  NA 138 139  K 4.5 3.7  CL 106 106  CO2 25 26  GLUCOSE 135* 108*  BUN 20 20  CREATININE 1.26* 1.27*  CALCIUM 8.9 9.1  PROT 7.2  --   ALBUMIN 4.1  --     AST 27  --   ALT 16*  --   ALKPHOS 38  --   BILITOT 1.0  --   GFRNONAA 50* 50*  GFRAA 58* 58*  ANIONGAP 7 7     Hematology Recent Labs Lab 03/07/17 1240 03/08/17 0033  WBC 7.3 7.6  RBC 4.12* 3.92*  HGB 13.9 12.9*  HCT 40.3 37.6*  MCV 97.8 96.0  MCH 33.7 32.9  MCHC 34.5 34.2  RDW 13.2 13.0  PLT 220 192    Cardiac Enzymes Recent Labs Lab 03/07/17 1240 03/07/17 1926 03/08/17 0033  TROPONINI <0.03 <0.03 <0.03   No results for input(s): TROPIPOC in the last 168 hours.   BNPNo results for input(s): BNP, PROBNP in the last 168 hours.   DDimer No results for input(s): DDIMER in the last 168 hours.   Radiology    No results found.  Cardiac Studies   Echocardiogram done this admission review person by myself showing normal  LV function, no significant valve disease  Patient Profile     William Taylor is a 83 y.o. male with a history of  coronary artery disease,  stent in 2005,  atrial fibrillation with RVR in the setting of UTI September 2011,  stent placement May 2012 to the OM 2,  PE following catheterization started on anticoagulation,  atrial fibrillation in 2013,  Anemia, hematocrit of 28 8 months ago Who presents to the hospital with palpitations, chest pain   Assessment & Plan    1. Paroxysmal atrial fibrillation  on anticoagulation , xarelto.  converted to normal sinus rhythm overnight 2 nights ago, around 6 PM without significant intervention He does have amiodarone at home ,  He prefers not to take this every day We will decrease diltiazem secondary to orthostasis down to 120 mg daily Recommended he watch blood pressure and heart rate at home If this runs high we may need 180 mg daily For recurrent episodes of atrial fibrillation we could consider amiodarone on a regular basis  2) chest pain Atypical in nature He ruled out for MI, this is not a non-STEMI Continue current outpatient regiment Suspect musculoskeletal Recommended ice, rare  use of NSAIDs as he is on anticoagulation  He does report lifting heavy items at home prior to symptoms If he has recurrent symptoms we would order outpatient stress testing  3)Orthostatic hypotension Less orthostatic this morning after holding isosorbide and decreasing diltiazem down to 120 mg daily Would continue diltiazem 120, metoprolol Hold the isosorbide at discharge We have recommended he try to drink more  4) chronic kidney disease Creatinine stable   Discussed with nursing, hospitalist service After orthostatics obtained, discussed the plan with patient again  Total encounter time more than 35 minutes  Greater than 50% was spent in counseling and coordination of care with the patient   Signed, Ida Rogue, MD  03/09/2017, 1:48 PM

## 2017-03-09 NOTE — Progress Notes (Signed)
Pt feeling well this amorning. Orthostatic vs much improved. He is to be discharged today. Iv and tele removed. disch instructions given to pt and wifie to their understanding. disch via w.c. Accompanied by wife.

## 2017-03-09 NOTE — Discharge Summary (Addendum)
William Taylor, is a 83 y.o. male  DOB 1930/04/14  MRN [id number].  Admission date:  03/07/2017  Admitting Physician  Lance Coon, MD  Discharge Date:  03/09/2017   Primary MD  Coral Spikes, DO  Recommendations for primary care physician for things to follow:  Follow with pcp in one week    Admission Diagnosis  Chest pain, unspecified type [R07.9]   Discharge Diagnosis  Chest pain, unspecified type [R07.9]   Principal Problem:   Chest pain Active Problems:   Hyperlipidemia   CAD (coronary artery disease)   ATRIAL FIBRILLATION   Chronic kidney disease (CKD), stage III (moderate)      Past Medical History:  Diagnosis Date  . Cancer [facility])    Prostate, followed by Dr. Jacqlyn Larsen  . Colon polyp   . Coronary artery disease   . Hyperlipidemia   . Hypertension   . MI (myocardial infarction) ([place])    2015  . PAF (paroxysmal atrial fibrillation) ([place])    a. on xarelto  . Skin cancer     Past Surgical History:  Procedure Laterality Date  . CARDIAC CATHETERIZATION  10/2013   [facility]  . CATARACT EXTRACTION  Oct. 3, 2012   right eye  . colonoscopy    . COLONOSCOPY W/ POLYPECTOMY  2015   Dr Rayann Heman  . COLONOSCOPY WITH PROPOFOL N/A 08/15/2016   Procedure: COLONOSCOPY WITH PROPOFOL;  Surgeon: [name], MD;  Location: [facility] ENDOSCOPY;  Service: Endoscopy;  Laterality: N/A;  . CORONARY ANGIOPLASTY  2009   2005; s/p stent  . ESOPHAGOGASTRODUODENOSCOPY (EGD) WITH PROPOFOL N/A 08/15/2016   Procedure: ESOPHAGOGASTRODUODENOSCOPY (EGD) WITH PROPOFOL;  Surgeon: [name], MD;  Location: [facility] ENDOSCOPY;  Service: Endoscopy;  Laterality: N/A;  . GIVENS CAPSULE STUDY N/A 09/26/2016   Procedure: GIVENS CAPSULE STUDY;  Surgeon: [name], MD;  Location: [facility] ENDOSCOPY;  Service: Endoscopy;  Laterality: N/A;  . HERNIA REPAIR  2013  .  LUMBAR SPINE SURGERY    . UPPER GI ENDOSCOPY  Sept 2015   Dr Rayann Heman       History of present illness and  Hospital Course:     Kindly see H&P for history of present illness and admission details, please review complete Labs, Consult reports and Test reports for all details in brief  HPI  from the history and physical done on the day of admission  83 year old male patient admitted for chest pain, irregular heartbeat. Admitted to telemetry for evaluation of acute MI.  Hospital Course   Atypical chest pain likely musculoskeletal. Troponin is negative for 3 times. Seen by cardiology.  #2. Paroxysmal  atrial fibrillation: Seen by Dr. Esmond Plants recommended to continue his home medicines including metoprolol, Xarelto. Decreased the dose of Cardizem.. Stopped imdur  also due to softer blood pressure. And orthostatic hypotension. Amiodarone  will be called in for breakthrough palpitations as per cardiology recommendation echocardiogram showed EF 60% with no wall motion abnormality.  Dizziness: Dizziness due to orthostatic hypotension, received 500 cc of normal saline, encourage fluid intake, decrease the dose of Cardizem. And stopped Imdur at disharge #5. history of prostate cancer: Continue home medicines.  Discharge Condition:stable   Follow UP  Follow-up Information    Coral Spikes, DO Follow up in 1 week(s).   Specialty:  Family Medicine Contact information: [place] [address] [place] [address] [phone]        [name], MD Follow up in 1 week(s).   Specialty:  Cardiology Contact  information: [place]  [address] [phone]             Discharge Instructions  and  Discharge Medications     Allergies as of 03/09/2017      Reactions   Bee Venom Swelling   Sulfa Antibiotics Hives, Swelling   Warfarin And Related Other (See Comments)   Chest pain      Medication List    TAKE these medications   amiodarone  200 MG tablet Commonly known as:  PACERONE Take 1 tablet (200 mg total) by mouth daily as needed.   cyproheptadine 4 MG tablet Commonly known as:  PERIACTIN Take 1 tablet (4 mg total) by mouth 3 (three) times daily as needed for allergies.   diltiazem 120 MG 24 hr capsule Commonly known as:  CARDIZEM CD Take 1 capsule (120 mg total) by mouth daily. What changed:  medication strength  See the new instructions.   esomeprazole 40 MG capsule Commonly known as:  NEXIUM TAKE 1 CAPSULE DAILY AT 12 NOON   fenofibrate micronized 130 MG capsule Commonly known as:  ANTARA TAKE 1 CAPSULE DAILY BEFORE BREAKFAST   ferrous sulfate 325 (65 FE) MG tablet Take 1 tablet (325 mg total) by mouth 2 (two) times daily with a meal.   finasteride 5 MG tablet Commonly known as:  PROSCAR Take 5 mg by mouth daily.   Fish Oil 1000 MG Caps Take 1,000 mg by mouth daily.   fluticasone 50 MCG/ACT nasal spray Commonly known as:  FLONASE Place 2 sprays into both nostrils daily.   isosorbide mononitrate 30 MG 24 hr tablet Commonly known as:  IMDUR Take 1 tablet (30 mg total) by mouth daily.   levobunolol 0.5 % ophthalmic solution Commonly known as:  BETAGAN Place 1 drop into the right eye 2 (two) times daily.   lisinopril 5 MG tablet Commonly known as:  PRINIVIL,ZESTRIL Take 1 tablet (5 mg total) by mouth daily.   metoprolol tartrate 25 MG tablet Commonly known as:  LOPRESSOR Take 25 mg by mouth 2 (two) times daily.   nitroGLYCERIN 0.4 MG SL tablet Commonly known as:  NITROSTAT DISSOLVE UNDER THE TONGUE 1 TABLET EVERY 5 MINUTES AS NEEDED FOR CHEST PAIN   pravastatin 40 MG tablet Commonly known as:  PRAVACHOL Take 1 tablet (40 mg total) by mouth at bedtime.   tamsulosin 0.4 MG Caps capsule Commonly known as:  FLOMAX Take 0.4 mg by mouth daily.   XARELTO 20 MG Tabs tablet Generic drug:  rivaroxaban TAKE 1 TABLET DAILY         Diet and Activity recommendation: See Discharge  Instructions above   Consults obtained -cardiology   Major procedures and Radiology Reports - PLEASE review detailed and final reports for all details, in brief -      Dg Chest Portable 1 View  Result Date: 03/07/2017 CLINICAL DATA:  Chest pressure. EXAM: PORTABLE CHEST 1 VIEW COMPARISON:  01/14/2016 chest radiograph FINDINGS: Stable normal cardiac silhouette. Aortic atherosclerosis with calcification. Clear lungs. No pleural effusion or pneumothorax. Bones are unremarkable. IMPRESSION: No acute pulmonary process identified. Electronically Signed   By: Kristine Garbe M.D.   On: 03/07/2017 13:33    Micro Results     No results found for this or any previous visit (from the past 240 hour(s)).     Today   Subjective:   William Taylor today has no headache,no chest abdominal pain,no new weakness tingling or numbness, feels much better wants  to go home today.   Objective:   Blood pressure 122/71, pulse (!) 106, temperature (!) 97.5 F (36.4 C), temperature source Oral, resp. rate 18, height 6\' 4"  (1.93 m), weight 105.8 kg (233 lb 3.2 oz), SpO2 98 %.   Intake/Output Summary (Last 24 hours) at 03/09/17 1138 Last data filed at 03/09/17 0900  Gross per 24 hour  Intake              480 ml  Output                0 ml  Net              480 ml    Exam Awake Alert, Oriented x 3, No new F.N deficits, Normal affect [place].AT,PERRAL Supple Neck,No JVD, No cervical lymphadenopathy appriciated.  Symmetrical Chest wall movement, Good air movement bilaterally, CTAB RRR,No Gallops,Rubs or new Murmurs, No Parasternal Heave +ve B.Sounds, Abd Soft, Non tender, No organomegaly appriciated, No rebound -guarding or rigidity. No Cyanosis, Clubbing or edema, No new Rash or bruise  Data Review   CBC w Diff:  Lab Results  Component Value Date   WBC 7.6 03/08/2017   HGB 12.9 (L) 03/08/2017   HGB 10.6 (L) 02/27/2014   HCT 37.6 (L) 03/08/2017   HCT 33.0 (L) 02/27/2014   PLT 192  03/08/2017   PLT 228 02/27/2014   LYMPHOPCT 28 12/10/2016   LYMPHOPCT 6.4 02/22/2014   MONOPCT 9 12/10/2016   MONOPCT 5.5 02/22/2014   EOSPCT 1 12/10/2016   EOSPCT 0.0 02/22/2014   BASOPCT 1 12/10/2016   BASOPCT 0.2 02/22/2014    CMP:  Lab Results  Component Value Date   NA 139 03/08/2017   NA 138 02/27/2014   K 3.7 03/08/2017   K 4.1 02/27/2014   CL 106 03/08/2017   CL 106 02/27/2014   CO2 26 03/08/2017   CO2 26 02/27/2014   BUN 20 03/08/2017   BUN 26 (H) 02/27/2014   CREATININE 1.27 (H) 03/08/2017   CREATININE 1.48 (H) 02/27/2014   CREATININE 1.22 12/13/2010   PROT 7.2 03/07/2017   PROT 7.1 02/27/2014   ALBUMIN 4.1 03/07/2017   ALBUMIN 3.3 (L) 02/27/2014   BILITOT 1.0 03/07/2017   BILITOT 0.5 02/27/2014   ALKPHOS 38 03/07/2017   ALKPHOS 37 (L) 02/27/2014   AST 27 03/07/2017   AST 23 02/27/2014   ALT 16 (L) 03/07/2017   ALT 18 02/27/2014  .   Total Time in preparing paper work, data evaluation and todays exam - 60 minutes  William Taylor M.D on 03/09/2017 at 11:38 AM    Note: This dictation was prepared with Dragon dictation along with smaller phrase technology. Any transcriptional errors that result from this process are unintentional.

## 2017-03-10 ENCOUNTER — Telehealth: Payer: Self-pay | Admitting: Cardiovascular Disease

## 2017-03-10 ENCOUNTER — Telehealth: Payer: Self-pay | Admitting: Family Medicine

## 2017-03-10 NOTE — Telephone Encounter (Signed)
Pt wife is calling regarding pt rx diltiazem 120 mg. She states it was not [facility]. Please call to discuss this.

## 2017-03-10 NOTE — Telephone Encounter (Signed)
This was changed in the hospital. Looks like it was sent over today.

## 2017-03-10 NOTE — Telephone Encounter (Signed)
Transition Care Management Follow-up Telephone Call  How have you been since you were released from the hospital? Feeling better since being home no chest pain. Just a little fatigued.   Do you understand why you were in the hospital? Yes   Do you understand the discharge instrcutions? yes  Items Reviewed:  Medications reviewed: yes, stopped  isosorbide added amiodarone 200 mg and increased diltiazem to  120 MG.;  Allergies reviewed:yes   Dietary changes reviewed: yes  Referrals reviewed:yes   Functional Questionnaire:   Activities of Daily Living (ADLs):   He states they are independent in the following: Independent in ADLs. States they require assistance with the following:No assist needed at this time.   Any transportation issues/concerns?:No   Any patient concerns? Not at this time.   Confirmed importance and date/time of follow-up visits scheduled: Yes   Confirmed with patient if condition begins to worsen call PCP or go to the ER.  Patient was given the Call-a-Nurse line [phone]: Yes

## 2017-03-14 ENCOUNTER — Encounter: Payer: Self-pay | Admitting: Family Medicine

## 2017-03-14 ENCOUNTER — Ambulatory Visit (INDEPENDENT_AMBULATORY_CARE_PROVIDER_SITE_OTHER): Payer: Medicare Other | Admitting: Family Medicine

## 2017-03-14 DIAGNOSIS — I480 Paroxysmal atrial fibrillation: Secondary | ICD-10-CM | POA: Diagnosis not present

## 2017-03-14 DIAGNOSIS — R079 Chest pain, unspecified: Secondary | ICD-10-CM

## 2017-03-14 NOTE — Patient Instructions (Signed)
Continue your medications.  Follow up in 6 months.  Take care  Dr. Georges Victorio  

## 2017-03-14 NOTE — Progress Notes (Signed)
Subjective:  Patient ID: William Taylor, male    DOB: [birth year]  Age: 83 y.o. MRN: [id number]  CC: Hospital follow up  HPI:  83 year old male with a history of CAD, hypertension, pulmonary embolus, paroxysmal A. fib, CKD, hyperlipidemia presents for hospital follow-up.  Patient admitted from 7/20 - 7/22. Hospital course reviewed and summarized as follows:  Patient presented with atypical chest pain and palpitation/irregular heartbeat.  Ruled out for MI. Chest pain thought to be atypical and musculoskeletal in nature.  He was found to be in atrial fibrillation. Cardiology saw the patient and he was continued on metoprolol and Xarelto. His Cardizem was decreased. Imdur was stopped. Amiodarone added.  Patient did well during hospitalization and was discharged home in stable condition.  Medications reviewed today. Reconciled with the patient.  Patient states that he's doing well. No chest pain. No palpitations. No shortness of breath. He has no complaints or concerns at this time.  He states that he is compliant with his medication.  Social Hx   Social History   Social History  . Marital status: Married    Spouse name: N/A  . Number of children: N/A  . Years of education: N/A   Social History Main Topics  . Smoking status: Never Smoker  . Smokeless tobacco: Never Used  . Alcohol use No  . Drug use: No  . Sexual activity: Not Asked   Other Topics Concern  . None   Social History Narrative  . None    Review of Systems  Respiratory: Negative.   Cardiovascular: Negative for chest pain and palpitations.   Objective:  BP 110/78 (BP Location: Left Arm, Patient Position: Sitting, Cuff Size: Normal)   Pulse 62   Temp 97.8 F (36.6 C)   Wt 225 lb 4 oz (102.2 kg)   SpO2 93%   BMI 27.42 kg/m   BP/Weight 03/14/2017 03/09/2017 02/28/4579  Systolic BP 998 338 -  Diastolic BP 78 71 -  Wt. (Lbs) 225.25 - 233.2  BMI 27.42 - 28.39   Physical Exam  Constitutional: He is  oriented to person, place, and time. He appears well-developed. No distress.  Cardiovascular: Normal rate and regular rhythm.   No murmur heard. Pulmonary/Chest: Effort normal. He has no wheezes. He has no rales.  Neurological: He is alert and oriented to person, place, and time.  Psychiatric: He has a normal mood and affect.  Vitals reviewed.  Lab Results  Component Value Date   WBC 7.6 03/08/2017   HGB 12.9 (L) 03/08/2017   HCT 37.6 (L) 03/08/2017   PLT 192 03/08/2017   GLUCOSE 108 (H) 03/08/2017   CHOL 110 06/28/2016   TRIG 80.0 06/28/2016   HDL 29.90 (L) 06/28/2016   LDLCALC 64 06/28/2016   ALT 16 (L) 03/07/2017   AST 27 03/07/2017   NA 139 03/08/2017   K 3.7 03/08/2017   CL 106 03/08/2017   CREATININE 1.27 (H) 03/08/2017   BUN 20 03/08/2017   CO2 26 03/08/2017   INR 2.43 01/14/2016   HGBA1C 6.5 06/28/2016    Assessment & Plan:   Problem List Items Addressed This Visit      Cardiovascular and Mediastinum   Paroxysmal A-fib ([place])    Patient now back in sinus rhythm. Doing well with medication changes. Continue current medications. Continue to follow with cardiology.        Other   Chest pain    Ruled out for MI. Now resolved. Doing well. Likely MSK or possibly from  Afib. Hospital course reviewed and summarized in history of present illness. Medications reviewed and reconciled today.        Follow-up: 6 months.   [place]

## 2017-03-14 NOTE — Assessment & Plan Note (Signed)
Patient now back in sinus rhythm. Doing well with medication changes. Continue current medications. Continue to follow with cardiology.

## 2017-03-14 NOTE — Assessment & Plan Note (Addendum)
Ruled out for MI. Now resolved. Doing well. Likely MSK or possibly from Afib. Hospital course reviewed and summarized in history of present illness. Medications reviewed and reconciled today.

## 2017-03-17 NOTE — Progress Notes (Signed)
Cardiology Office Note  Date:  03/18/2017   ID:  William Taylor, DOB 06-Mar-1930, MRN [id number]  PCP:  William Spikes, DO   Chief Complaint  Patient presents with  . other     Follow up [facility]; chest pain. Meds reviewed verbally with patient.     HPI:  William Taylor is a very pleasant 83 year old gentleman with a history of  coronary artery disease, stent in 2005,  atrial fibrillation with RVR in the setting of UTI September 2011,  stent placement May 2012 to the OM 2,  PE following catheterization started on anticoagulation, atrial fibrillation in 2013,  who presents for follow-up of his coronary disease and atrial fibrillation Last atrial fibrillation July 2018  atypical chest pain July 2018 History of anemia  Recent hospitalization for atrial fibrillation Also had orthostatic hypotension, chest pain developed palpitations Friday, July 20,  He called our office, was given the choice of emergency room for cardioversion as he has been anticoagulated or outpatient treatment with oral amiodarone. He decided to go to the emergency room Chest pain was felt to be atypical, no testing performed Emergency notes reviewed and they detail chest pain when sitting up  he reports having chest pain when he rolled over in bed this morning  imdur stopped, secondary to low blood pressure Diltiazem decreased down to 120 mg daily He is taking amiodarone 200 mg daily since discharge   we encouraged him to increase his fluid intake for orthostasis  Typically does not have chest pain with exertion , rarely takes nitroglycerin  EKG on today's visit shows normal sinus rhythm with rate 62 bpm, no significant ST or T-wave changes   Other past medical history reviewed Previous history of anemia hematocrit down to 28 Recent  EGD, colonoscopy, video capsule enteroscopy  He has follow-up with GI in one week, polyps noted Results reviewed with him in brief, pulled up, computer He denies any  bleeding Stool was dark from iron which she takes once a day Feeling well HCT 28 to 34  Total chol 110, LDL 64 HBA1C 6.5   previous  injury of his left shoulder, rotator cuff tear    other past medical history previous episode ofchest pain, went to the emergency room November 2016. Ruled out for MI, had Myoview showing no ischemia. He was noted to have atrial tachycardia with rates up to the 150 range, metoprolol tartrate increased up to 50 mg twice a day. On the higher dose metoprolol, he has not had any further episodes of tachycardia since discharge. Previously he did note heart rate up to 190 going down to 150, measured with a pulse oximeter. None recently  catheterization in 2009 showing patent stent with no other significant disease, normal systolic function in September 2011 who presented to the emergency room September 2011 with malaise, atrial fibrillation with RVR in the setting of a urinary tract infection, recent admission to the hospital November 14 2010 for atrial fibrillation that developed after pushing mowing with some chest pain. He converted back to normal sinus rhythm, Presenting to [facility] with chest discomfort with cardiac catheterization Dec 25 2010, showing 90% OM 2 disease, transferred to [facility] with DES stent placed, 2.25 x 16 mm POMUS stent, with flank pain developing after discharge, admitted to [facility] and diagnosed with pulmonary embolism with heavy clot burden.   Found to be in atrial fibrillation in 2013. He had symptoms of shortness of breath with exertion.  After one month on xarelto, amiodarone load  and titration downward was started with conversion to NSR in October 2013  EKG the end of October 2013 confirmed normal sinus rhythm.   History of back surgery in 2008, history of prostate cancer followed by William Taylor   He was admitted to the hospital 10/27/2013 with chest pain. He had held his Plavix for colonoscopy 10/26/2013 with polypectomy x3. Cardiac  catheterization performed given his severe chest pain and no disease showing severe small vessel disease, patent stents Amiodarone was held for bradycardia, he was started on isosorbide 30 mg daily, Plavix was held, he was continued on aspirin and xarelto.  Cardiac catheterization results; 90% disease at the ostium of a small diagonal vessel, small OM vessel with 80% disease in the proximal region Otherwise stents are patent  Echocardiogram 10/28/2013 shows normal LV function, mildly dilated left atrium, mild aortic regurg, normal right ventricular systolic pressures   PMH:   has a past medical history of Cancer ([place]); Colon polyp; Coronary artery disease; Hyperlipidemia; Hypertension; MI (myocardial infarction) ([place]); PAF (paroxysmal atrial fibrillation) ([place]); and Skin cancer.  PSH:    Past Surgical History:  Procedure Laterality Date  . CARDIAC CATHETERIZATION  10/2013   [facility]  . CATARACT EXTRACTION  Oct. 3, 2012   right eye  . colonoscopy    . COLONOSCOPY W/ POLYPECTOMY  2015   Dr Rayann Taylor  . COLONOSCOPY WITH PROPOFOL N/A 08/15/2016   Procedure: COLONOSCOPY WITH PROPOFOL;  Surgeon: William Bellows, MD;  Location: [facility] ENDOSCOPY;  Service: Endoscopy;  Laterality: N/A;  . CORONARY ANGIOPLASTY  2009   2005; s/p stent  . ESOPHAGOGASTRODUODENOSCOPY (EGD) WITH PROPOFOL N/A 08/15/2016   Procedure: ESOPHAGOGASTRODUODENOSCOPY (EGD) WITH PROPOFOL;  Surgeon: William Bellows, MD;  Location: [facility] ENDOSCOPY;  Service: Endoscopy;  Laterality: N/A;  . GIVENS CAPSULE STUDY N/A 09/26/2016   Procedure: GIVENS CAPSULE STUDY;  Surgeon: William Bellows, MD;  Location: [facility] ENDOSCOPY;  Service: Endoscopy;  Laterality: N/A;  . HERNIA REPAIR  2013  . LUMBAR SPINE SURGERY    . UPPER GI ENDOSCOPY  Sept 2015   Dr Rayann Taylor    Current Outpatient Prescriptions  Medication Sig Dispense Refill  . amiodarone (PACERONE) 200 MG tablet Take 1 tablet (200 mg total) by mouth daily as needed. 30 tablet 2  . cyproheptadine (PERIACTIN) 4  MG tablet Take 1 tablet (4 mg total) by mouth 3 (three) times daily as needed for allergies. 90 tablet 3  . diltiazem (CARDIZEM CD) 120 MG 24 hr capsule Take 1 capsule (120 mg total) by mouth daily. 90 capsule 4  . esomeprazole (NEXIUM) 40 MG capsule TAKE 1 CAPSULE DAILY AT 12 NOON 90 capsule 3  . fenofibrate micronized (ANTARA) 130 MG capsule TAKE 1 CAPSULE DAILY BEFORE BREAKFAST 90 capsule 1  . finasteride (PROSCAR) 5 MG tablet Take 5 mg by mouth daily.      . fluticasone (FLONASE) 50 MCG/ACT nasal spray Place 2 sprays into both nostrils daily. 16 g 6  . levobunolol (BETAGAN) 0.5 % ophthalmic solution Place 1 drop into the right eye 2 (two) times daily.     Marland Kitchen lisinopril (PRINIVIL,ZESTRIL) 5 MG tablet Take 1 tablet (5 mg total) by mouth daily. 90 tablet 3  . metoprolol tartrate (LOPRESSOR) 25 MG tablet Take 25 mg by mouth 2 (two) times daily.    . nitroGLYCERIN (NITROSTAT) 0.4 MG SL tablet DISSOLVE UNDER THE TONGUE 1 TABLET EVERY 5 MINUTES AS NEEDED FOR CHEST PAIN 25 tablet 3  . Omega-3 Fatty Acids (FISH OIL) 1000 MG CAPS Take  1,000 mg by mouth daily.    . pravastatin (PRAVACHOL) 40 MG tablet Take 1 tablet (40 mg total) by mouth at bedtime. 90 tablet 3  . Tamsulosin HCl (FLOMAX) 0.4 MG CAPS Take 0.4 mg by mouth daily.    Alveda Reasons 20 MG TABS tablet TAKE 1 TABLET DAILY 90 tablet 3  . ferrous sulfate 325 (65 FE) MG tablet Take 1 tablet (325 mg total) by mouth 2 (two) times daily with a meal. (Patient not taking: Reported on 03/07/2017) 124 tablet 2   No current facility-administered medications for this visit.      Allergies:   Bee venom; Sulfa antibiotics; and Warfarin and related   Social History:  The patient  reports that he has never smoked. He has never used smokeless tobacco. He reports that he does not drink alcohol or use drugs.   Family History:   family history includes Colon cancer in his father; Stroke in his mother.    Review of Systems: Review of Systems  Constitutional:  Negative.   Respiratory: Negative.   Cardiovascular: Negative.   Gastrointestinal: Negative.   Musculoskeletal: Negative.   Neurological: Negative.   Psychiatric/Behavioral: Negative.   All other systems reviewed and are negative.    PHYSICAL EXAM: VS:  BP 116/66 (BP Location: Left Arm, Patient Position: Sitting, Cuff Size: Normal)   Pulse 62   Ht 6\' 4"  (1.93 m)   Wt 225 lb 12 oz (102.4 kg)   BMI 27.48 kg/m  , BMI Body mass index is 27.48 kg/m. GEN: Well nourished, well developed, in no acute distress  HEENT: normal  Neck: no JVD, carotid bruits, or masses Cardiac: RRR; no murmurs, rubs, or gallops,no edema  Respiratory:  clear to auscultation bilaterally, normal work of breathing GI: soft, nontender, nondistended, + BS MS: no deformity or atrophy  Skin: warm and dry, no rash Neuro:  Strength and sensation are intact Psych: euthymic mood, full affect    Recent Labs: 03/07/2017: ALT 16 03/08/2017: BUN 20; Creatinine, Ser 1.27; Hemoglobin 12.9; Platelets 192; Potassium 3.7; Sodium 139    Lipid Panel Lab Results  Component Value Date   CHOL 110 06/28/2016   HDL 29.90 (L) 06/28/2016   LDLCALC 64 06/28/2016   TRIG 80.0 06/28/2016      Wt Readings from Last 3 Encounters:  03/18/17 225 lb 12 oz (102.4 kg)  03/14/17 225 lb 4 oz (102.2 kg)  03/07/17 233 lb 3.2 oz (105.8 kg)       ASSESSMENT AND PLAN:   Mixed hyperlipidemia - Plan: EKG 12-Lead Cholesterol is at goal on the current lipid regimen. No changes to the medications were made.  Coronary artery disease involving native coronary artery of native heart without angina pectoris - Plan: EKG 12-Lead Recent hospitalization, atypical chest pain No further testing needed at this time  Atrial fibrillation, unspecified type ([place]) - Plan: EKG 12-Lead  on Xarelto He is on metoprolol and diltiazem 120 Will defer to him whether he would like to take amiodarone daily for suppression of his atrial  fibrillation  Congestive heart failure, unspecified congestive heart failure chronicity, unspecified congestive heart failure type ([place]) - Plan: EKG 12-Lead Appears relatively euvolemic on today's visit was, minimal ankle edema, wears compression hose, no change in shortness of breath  Chronic kidney disease (CKD), stage III (moderate) Stable renal function, recommended he avoid NSAIDs  Iron deficiency anemia, unspecified iron deficiency anemia type Stable  Orthostatic hypotension Isosorbide held, diltiazem decreased down to 120 mg daily, denies having  any symptoms   Total encounter time more than 25 minutes  Greater than 50% was spent in counseling and coordination of care with the patient   Disposition:   F/U  6 months   Orders Placed This Encounter  Procedures  . EKG 12-Lead     Signed, Esmond Plants, M.D., Ph.D. 03/18/2017  [place], [place]

## 2017-03-18 ENCOUNTER — Encounter: Payer: Self-pay | Admitting: Cardiovascular Disease

## 2017-03-18 ENCOUNTER — Ambulatory Visit (INDEPENDENT_AMBULATORY_CARE_PROVIDER_SITE_OTHER): Payer: Medicare Other | Admitting: Cardiovascular Disease

## 2017-03-18 VITALS — BP 116/66 | HR 62 | Ht 76.0 in | Wt 225.8 lb

## 2017-03-18 DIAGNOSIS — N183 Chronic kidney disease, stage 3 unspecified: Secondary | ICD-10-CM

## 2017-03-18 DIAGNOSIS — I480 Paroxysmal atrial fibrillation: Secondary | ICD-10-CM

## 2017-03-18 DIAGNOSIS — E782 Mixed hyperlipidemia: Secondary | ICD-10-CM | POA: Diagnosis not present

## 2017-03-18 DIAGNOSIS — I2782 Chronic pulmonary embolism: Secondary | ICD-10-CM | POA: Diagnosis not present

## 2017-03-18 DIAGNOSIS — I25118 Atherosclerotic heart disease of native coronary artery with other forms of angina pectoris: Secondary | ICD-10-CM

## 2017-03-18 MED ORDER — AMIODARONE HCL 200 MG PO TABS: 200.0000 mg | ORAL_TABLET | Freq: Every day | ORAL | 4 refills | Status: DC | PRN

## 2017-03-18 MED ORDER — DILTIAZEM HCL ER COATED BEADS 120 MG PO CP24: 120.0000 mg | ORAL_CAPSULE | Freq: Every day | ORAL | 4 refills | Status: DC

## 2017-03-18 NOTE — Patient Instructions (Signed)

## 2017-03-24 ENCOUNTER — Ambulatory Visit (INDEPENDENT_AMBULATORY_CARE_PROVIDER_SITE_OTHER): Payer: Medicare Other | Admitting: Gastroenterology

## 2017-03-24 ENCOUNTER — Encounter: Payer: Self-pay | Admitting: Gastroenterology

## 2017-03-24 VITALS — BP 129/75 | HR 70 | Temp 97.8°F | Ht 76.0 in | Wt 225.6 lb

## 2017-03-24 DIAGNOSIS — D509 Iron deficiency anemia, unspecified: Secondary | ICD-10-CM

## 2017-03-24 NOTE — Progress Notes (Signed)
[name] MD, MRCP(U.K) [address]  [place]  [place], [place] [address]  Main: [phone]  Fax: [phone]   Primary Care Physician: Coral Spikes, DO  Primary Gastroenterologist:  Dr. [name]   Chief Complaint  Patient presents with  . Follow-up    Stomach discomfort @ night when laying down    HPI: William Taylor is a 83 y.o. male    Summary of history : He has a history of CAD,stent placment in 2005 , Atrial fibrillation 2011, On anticoagulation . At a visit to see his cardiologist Dr Rockey Situ , reported symptoms of shortness of breath, fatigue ,exertion . He was noted to have a drop in his Hb , stool was positive for blood . He was on xarelto. He has external hemorroids. 06/2016 Ferritin 9 , TIBC elevated. 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  10/03/16- capsule study showed no bleeding but a few small polyps were seen .  Interval history 11/2016-03/2017   Hb 02/2017 improved to 12.9 grams mcv 96 .Taking his oral iron once daily , no bleeding seen , no other complaints.   Very occasionally has some left upper abdomen discomfort on the left side at night .   Current Outpatient Prescriptions  Medication Sig Dispense Refill  . amiodarone (PACERONE) 200 MG tablet Take 1 tablet (200 mg total) by mouth daily as needed. 90 tablet 4  . cyproheptadine (PERIACTIN) 4 MG tablet Take 1 tablet (4 mg total) by mouth 3 (three) times daily as needed for allergies. 90 tablet 3  . diltiazem (CARDIZEM CD) 120 MG 24 hr capsule Take 1 capsule (120 mg total) by mouth daily. 90 capsule 4  . esomeprazole (NEXIUM) 40 MG capsule TAKE 1 CAPSULE DAILY AT 12 NOON 90 capsule 3  . fenofibrate micronized (ANTARA) 130 MG capsule TAKE 1 CAPSULE DAILY BEFORE BREAKFAST 90 capsule 1  .  finasteride (PROSCAR) 5 MG tablet Take 5 mg by mouth daily.      . fluticasone (FLONASE) 50 MCG/ACT nasal spray Place 2 sprays into both nostrils daily. 16 g 6  . levobunolol (BETAGAN) 0.5 % ophthalmic solution Place 1 drop into the right eye 2 (two) times daily.     Marland Kitchen lisinopril (PRINIVIL,ZESTRIL) 5 MG tablet Take 1 tablet (5 mg total) by mouth daily. 90 tablet 3  . metoprolol tartrate (LOPRESSOR) 25 MG tablet Take 25 mg by mouth 2 (two) times daily.    . nitroGLYCERIN (NITROSTAT) 0.4 MG SL tablet DISSOLVE UNDER THE TONGUE 1 TABLET EVERY 5 MINUTES AS NEEDED FOR CHEST PAIN 25 tablet 3  . Omega-3 Fatty Acids (FISH OIL) 1000 MG CAPS Take 1,000 mg by mouth daily.    . pravastatin (PRAVACHOL) 40 MG tablet Take 1 tablet (40 mg total) by mouth at bedtime. 90 tablet 3  . Tamsulosin HCl (FLOMAX) 0.4 MG CAPS Take 0.4 mg by mouth daily.    Alveda Reasons 20 MG TABS tablet TAKE 1 TABLET DAILY 90 tablet 3  . ferrous sulfate 325 (65 FE) MG tablet Take 1 tablet (325 mg total) by mouth 2 (two) times daily with a meal. (Patient not taking: Reported on 03/07/2017) 124 tablet 2   No current facility-administered medications for this visit.     Allergies as of 03/24/2017 - Review Complete 03/24/2017  Allergen Reaction Noted  .  Bee venom Swelling 07/17/2015  . Sulfa antibiotics Hives and Swelling 01/22/2016  . Warfarin and related Other (See Comments) 05/18/2012    ROS:  General: Negative for anorexia, weight loss, fever, chills, fatigue, weakness. ENT: Negative for hoarseness, difficulty swallowing , nasal congestion. CV: Negative for chest pain, angina, palpitations, dyspnea on exertion, peripheral edema.  Respiratory: Negative for dyspnea at rest, dyspnea on exertion, cough, sputum, wheezing.  GI: See history of present illness. GU:  Negative for dysuria, hematuria, urinary incontinence, urinary frequency, nocturnal urination.  Endo: Negative for unusual weight change.    Physical Examination:   BP 129/75  (BP Location: Left Arm, Patient Position: Sitting, Cuff Size: Normal)   Pulse 70   Temp 97.8 F (36.6 C) (Oral)   Ht 6\' 4"  (1.93 m)   Wt 225 lb 9.6 oz (102.3 kg)   BMI 27.46 kg/m   General: Well-nourished, well-developed in no acute distress.  Eyes: No icterus. Conjunctivae pink. Mouth: Oropharyngeal mucosa moist and pink , no lesions erythema or exudate. Lungs: Clear to auscultation bilaterally. Non-labored. Heart: Regular rate and rhythm, no murmurs rubs or gallops.  Abdomen: Bowel sounds are normal, nontender, nondistended, no hepatosplenomegaly or masses, no abdominal bruits or hernia , no rebound or guarding.   Extremities: No lower extremity edema. No clubbing or deformities. Neuro: Alert and oriented x 3.  Grossly intact. Skin: Warm and dry, no jaundice.   Psych: Alert and cooperative, normal mood and affect.   Imaging Studies: Dg Chest Portable 1 View  Result Date: 03/07/2017 CLINICAL DATA:  Chest pressure. EXAM: PORTABLE CHEST 1 VIEW COMPARISON:  01/14/2016 chest radiograph FINDINGS: Stable normal cardiac silhouette. Aortic atherosclerosis with calcification. Clear lungs. No pleural effusion or pneumothorax. Bones are unremarkable. IMPRESSION: No acute pulmonary process identified. Electronically Signed   By: Kristine Garbe M.D.   On: 03/07/2017 13:33    Assessment and Plan:   William Taylor is a 83 y.o. y/o male here for follow up for iron deficiency anemia.  commenced him on oral iron in 07/2016. Hb and iron studies have normalized. Capsule study showed 4 small tiny small bowel polyps which were non bleeding . Long discussion with possible options of doing nothing vs small bowel enteroscopy, discussed risk of procedure, anesthesia issues , discussed low risk of malignancy of small bowel but with polyps slightly increased risk and only way to determine nature of polyp is by biopsy. At present he is not keen on enteroscopy and I will monitor his CBC closely.  Plan    1. Repeat CBC, iron studies in 6 months and if normal will plan to stop iron and monitor  Dr [name]  MD,MRCP [facility]) Follow up in 6 months

## 2017-04-14 ENCOUNTER — Ambulatory Visit: Payer: Medicare Other | Admitting: Cardiovascular Disease

## 2017-04-24 ENCOUNTER — Ambulatory Visit (INDEPENDENT_AMBULATORY_CARE_PROVIDER_SITE_OTHER): Payer: Medicare Other | Admitting: *Deleted

## 2017-04-24 DIAGNOSIS — Z23 Encounter for immunization: Secondary | ICD-10-CM | POA: Diagnosis not present

## 2017-06-30 ENCOUNTER — Ambulatory Visit: Payer: Medicare Other | Admitting: Family Medicine

## 2017-07-16 ENCOUNTER — Encounter: Payer: Self-pay | Admitting: Family Medicine

## 2017-07-16 ENCOUNTER — Ambulatory Visit: Payer: Medicare Other | Admitting: Family Medicine

## 2017-07-16 ENCOUNTER — Other Ambulatory Visit: Payer: Self-pay

## 2017-07-16 DIAGNOSIS — D509 Iron deficiency anemia, unspecified: Secondary | ICD-10-CM | POA: Diagnosis not present

## 2017-07-16 DIAGNOSIS — I480 Paroxysmal atrial fibrillation: Secondary | ICD-10-CM | POA: Diagnosis not present

## 2017-07-16 DIAGNOSIS — C61 Malignant neoplasm of prostate: Secondary | ICD-10-CM

## 2017-07-16 DIAGNOSIS — I10 Essential (primary) hypertension: Secondary | ICD-10-CM | POA: Diagnosis not present

## 2017-07-16 NOTE — Assessment & Plan Note (Signed)
Well-controlled.  Continue current regimen. 

## 2017-07-16 NOTE — Assessment & Plan Note (Signed)
Has stabilized.  Continue iron supplement.  Continue to follow with GI.

## 2017-07-16 NOTE — Assessment & Plan Note (Signed)
Followed by urology.  Recent PSA normal.

## 2017-07-16 NOTE — Assessment & Plan Note (Signed)
Sinus rhythm on exam.  Continue current regimen.  Continue to follow with cardiology.

## 2017-07-16 NOTE — Patient Instructions (Signed)
Nice to meet you. Please continue to monitor your blood pressure. Please start the exercises for your right shoulder.

## 2017-07-16 NOTE — Progress Notes (Signed)
  Tommi Rumps, MD Phone: [phone]  William Taylor is a 83 y.o. male who presents today for follow-up.  A. fib: Follows with cardiology.  Currently on metoprolol, diltiazem, and amiodarone.  No palpitations or bleeding issues.  Takes Xarelto.  Anemia: Has had an extensive workup through GI.  He notes no bleeding issues.  He continues on the iron supplement.  He will continue to monitor.  Hypertension: Typically 125-130s/70s.  No chest pain or shortness of breath.  Rare chronic left ankle edema which he wears compression stockings for.  Prostate cancer history: Follows with urology every 6 months.  Most recent PSA normal.  Continues on finasteride and Flomax.  History of rotator cuff tear left shoulder: Does have some right shoulder discomfort.  No pain in his left shoulder after physical therapy.  Certain movements cause the right shoulder to hurt just a little bit.  Social History   Tobacco Use  Smoking Status Never Smoker  Smokeless Tobacco Never Used     ROS see history of present illness  Objective  Physical Exam Vitals:   07/16/17 1102  BP: 112/72  Pulse: 64  Temp: 97.8 F (36.6 C)  SpO2: 95%    BP Readings from Last 3 Encounters:  07/16/17 112/72  03/24/17 129/75  03/18/17 116/66   Wt Readings from Last 3 Encounters:  07/16/17 220 lb 6.4 oz (100 kg)  03/24/17 225 lb 9.6 oz (102.3 kg)  03/18/17 225 lb 12 oz (102.4 kg)    Physical Exam  Constitutional: No distress.  Cardiovascular: Normal rate, regular rhythm and normal heart sounds.  Pulmonary/Chest: Effort normal and breath sounds normal.  Musculoskeletal: He exhibits no edema.  Slight decreased abduction of the left shoulder, otherwise full range of motion bilateral shoulders, right shoulder with slight discomfort on internal rotation negative empty can bilaterally, 5/5 strength bilateral biceps, triceps, and grip, sensation light touch intact bilateral upper extremities, bilateral shoulders  nontender  Neurological: He is alert. Gait normal.  Skin: Skin is warm and dry. He is not diaphoretic.     Assessment/Plan: Please see individual problem list.  Hypertension Well-controlled.  Continue current regimen.  Paroxysmal A-fib (HCC) Sinus rhythm on exam.  Continue current regimen.  Continue to follow with cardiology.  Iron deficiency anemia Has stabilized.  Continue iron supplement.  Continue to follow with GI.  Prostate cancer Followed by urology.  Recent PSA normal.   William Taylor was seen today for follow-up.  Diagnoses and all orders for this visit:  Essential hypertension  Paroxysmal A-fib (HCC)  Iron deficiency anemia, unspecified iron deficiency anemia type  Prostate cancer ([place])    No orders of the defined types were placed in this encounter.   No orders of the defined types were placed in this encounter.    Tommi Rumps, MD [place]

## 2017-07-26 ENCOUNTER — Other Ambulatory Visit: Payer: Self-pay | Admitting: Family Medicine

## 2017-07-26 DIAGNOSIS — Z760 Encounter for issue of repeat prescription: Secondary | ICD-10-CM

## 2017-07-31 ENCOUNTER — Other Ambulatory Visit: Payer: Self-pay | Admitting: Cardiovascular Disease

## 2017-07-31 ENCOUNTER — Other Ambulatory Visit: Payer: Self-pay

## 2017-07-31 ENCOUNTER — Other Ambulatory Visit: Payer: Self-pay | Admitting: Family Medicine

## 2017-07-31 DIAGNOSIS — Z760 Encounter for issue of repeat prescription: Secondary | ICD-10-CM

## 2017-07-31 MED ORDER — PRAVASTATIN SODIUM 40 MG PO TABS: 40.0000 mg | ORAL_TABLET | Freq: Every day | ORAL | 3 refills | Status: DC

## 2017-07-31 NOTE — Telephone Encounter (Signed)
Last OV 07/16/17 last filled by Dr.Cook 09/11/16 90 3rf

## 2017-08-01 ENCOUNTER — Other Ambulatory Visit: Payer: Self-pay | Admitting: Family Medicine

## 2017-08-01 NOTE — Telephone Encounter (Signed)
Please review for refill, Thanks !  

## 2017-08-20 DIAGNOSIS — M5137 Other intervertebral disc degeneration, lumbosacral region: Secondary | ICD-10-CM | POA: Diagnosis not present

## 2017-08-20 DIAGNOSIS — M9903 Segmental and somatic dysfunction of lumbar region: Secondary | ICD-10-CM | POA: Diagnosis not present

## 2017-09-04 ENCOUNTER — Other Ambulatory Visit: Payer: Self-pay

## 2017-09-06 ENCOUNTER — Other Ambulatory Visit: Payer: Self-pay | Admitting: Cardiovascular Disease

## 2017-09-10 DIAGNOSIS — M79674 Pain in right toe(s): Secondary | ICD-10-CM | POA: Diagnosis not present

## 2017-09-10 DIAGNOSIS — L600 Ingrowing nail: Secondary | ICD-10-CM | POA: Diagnosis not present

## 2017-09-10 DIAGNOSIS — M79675 Pain in left toe(s): Secondary | ICD-10-CM | POA: Diagnosis not present

## 2017-09-10 DIAGNOSIS — B351 Tinea unguium: Secondary | ICD-10-CM | POA: Diagnosis not present

## 2017-09-13 NOTE — Progress Notes (Signed)
Cardiology Office Note  Date:  09/15/2017   ID:  CORDAE MCCAREY, DOB 1930-04-09, MRN [id number]  PCP:  [name], MD   Chief Complaint  Patient presents with  . other    6 month follow up. Meds reviewed by the pt. verbally. Pt. c/o shortness of breath with little to no exertion.     HPI:  Mr. Sermon is a very pleasant 84 year old gentleman with a history of  coronary artery disease, stent in 2005,  atrial fibrillation with RVR in the setting of UTI September 2011,  stent placement May 2012 to the OM 2,  PE following catheterization started on anticoagulation, atrial fibrillation in 2013,  Last atrial fibrillation July 2018  atypical chest pain July 2018 History of anemia who presents for follow-up of his coronary disease and atrial fibrillation  SOB, chronic issue over the past several months, not going away Has been taking NTG for left side chest pain Weight loss 7 pounds over the past year  Last stress test 06/2015 Wonders if he could have a blockage of the stent No regular exercise, "have a problem with walking and breathing"  Echo 03/2017 EF >55%  Tolerating anticoagulation Denies any palpitations concerning for atrial fibrillation  Total chol 110, LDL 64 HBA1C 6.5  EKG personally reviewed by myself on todays visit Shows sinus bradycardia rate 57 bpm no significant ST or T wave changes  Other past medical history reviewed   hospitalization for atrial fibrillation 02/2017 had orthostatic hypotension, chest pain developed palpitations Friday, July 20,  He called our office, was given the choice of emergency room for cardioversion as he has been anticoagulated or outpatient treatment with oral amiodarone. He decided to go to the emergency room Chest pain was felt to be atypical, no testing performed Emergency notes reviewed and they detail chest pain when sitting up  he reports having chest pain when he rolled over in bed this morning  imdur stopped,  secondary to low blood pressure Diltiazem decreased down to 120 mg daily He is taking amiodarone 200 mg daily since discharge   we encouraged him to increase his fluid intake for orthostasis  Previous history of anemia hematocrit down to 28 Recent  EGD, colonoscopy, video capsule enteroscopy  Stool was dark from iron which she takes once a day HCT 28 to 34   previous  injury of his left shoulder, rotator cuff tear   previous episode ofchest pain, went to the emergency room November 2016. Ruled out for MI, had Myoview showing no ischemia. He was noted to have atrial tachycardia with rates up to the 150 range, metoprolol tartrate increased up to 50 mg twice a day. On the higher dose metoprolol, he has not had any further episodes of tachycardia since discharge. Previously he did note heart rate up to 190 going down to 150, measured with a pulse oximeter. None recently  catheterization in 2009 showing patent stent with no other significant disease, normal systolic function in September 2011 who presented to the emergency room September 2011 with malaise, atrial fibrillation with RVR in the setting of a urinary tract infection, recent admission to the hospital November 14 2010 for atrial fibrillation that developed after pushing mowing with some chest pain. He converted back to normal sinus rhythm, Presenting to [facility] with chest discomfort with cardiac catheterization Dec 25 2010, showing 90% OM 2 disease, transferred to [facility] with DES stent placed, 2.25 x 16 mm POMUS stent, with flank pain developing after discharge, admitted  to [facility] and diagnosed with pulmonary embolism with heavy clot burden.   Found to be in atrial fibrillation in 2013. He had symptoms of shortness of breath with exertion.  After one month on xarelto, amiodarone load and titration downward was started with conversion to NSR in October 2013  EKG the end of October 2013 confirmed normal sinus rhythm.   History of back  surgery in 2008, history of prostate cancer followed by Dr. Jacqlyn Larsen   He was admitted to the hospital 10/27/2013 with chest pain. He had held his Plavix for colonoscopy 10/26/2013 with polypectomy x3. Cardiac catheterization performed given his severe chest pain and no disease showing severe small vessel disease, patent stents Amiodarone was held for bradycardia, he was started on isosorbide 30 mg daily, Plavix was held, he was continued on aspirin and xarelto.  Cardiac catheterization results; 90% disease at the ostium of a small diagonal vessel, small OM vessel with 80% disease in the proximal region Otherwise stents are patent  Echocardiogram 10/28/2013 shows normal LV function, mildly dilated left atrium, mild aortic regurg, normal right ventricular systolic pressures   PMH:   has a past medical history of Cancer ([place]), Colon polyp, Coronary artery disease, Hyperlipidemia, Hypertension, MI (myocardial infarction) ([place]), PAF (paroxysmal atrial fibrillation) ([place]), and Skin cancer.  PSH:    Past Surgical History:  Procedure Laterality Date  . CARDIAC CATHETERIZATION  10/2013   [facility]  . CATARACT EXTRACTION  Oct. 3, 2012   right eye  . colonoscopy    . COLONOSCOPY W/ POLYPECTOMY  2015   Dr Rayann Heman  . COLONOSCOPY WITH PROPOFOL N/A 08/15/2016   Procedure: COLONOSCOPY WITH PROPOFOL;  Surgeon: [name], MD;  Location: [facility] ENDOSCOPY;  Service: Endoscopy;  Laterality: N/A;  . CORONARY ANGIOPLASTY  2009   2005; s/p stent  . ESOPHAGOGASTRODUODENOSCOPY (EGD) WITH PROPOFOL N/A 08/15/2016   Procedure: ESOPHAGOGASTRODUODENOSCOPY (EGD) WITH PROPOFOL;  Surgeon: [name], MD;  Location: [facility] ENDOSCOPY;  Service: Endoscopy;  Laterality: N/A;  . GIVENS CAPSULE STUDY N/A 09/26/2016   Procedure: GIVENS CAPSULE STUDY;  Surgeon: [name], MD;  Location: [facility] ENDOSCOPY;  Service: Endoscopy;  Laterality: N/A;  . HERNIA REPAIR  2013  . LUMBAR SPINE SURGERY    . UPPER GI ENDOSCOPY  Sept 2015   Dr Rayann Heman     Current Outpatient Medications  Medication Sig Dispense Refill  . amiodarone (PACERONE) 200 MG tablet Take 1 tablet (200 mg total) by mouth daily as needed. 90 tablet 4  . cyproheptadine (PERIACTIN) 4 MG tablet Take 1 tablet (4 mg total) by mouth 3 (three) times daily as needed for allergies. 90 tablet 3  . diltiazem (CARDIZEM CD) 120 MG 24 hr capsule Take 1 capsule (120 mg total) by mouth daily. 90 capsule 4  . esomeprazole (NEXIUM) 40 MG capsule TAKE 1 CAPSULE BY MOUTH  DAILY AT 12 NOON 90 capsule 3  . fenofibrate micronized (ANTARA) 130 MG capsule TAKE 1 CAPSULE DAILY BEFORE BREAKFAST 90 capsule 1  . ferrous sulfate 325 (65 FE) MG tablet Take 1 tablet (325 mg total) by mouth 2 (two) times daily with a meal. (Patient taking differently: Take 325 mg by mouth daily with breakfast. ) 124 tablet 2  . finasteride (PROSCAR) 5 MG tablet Take 5 mg by mouth daily.      Marland Kitchen levobunolol (BETAGAN) 0.5 % ophthalmic solution Place 1 drop into the right eye 2 (two) times daily.     Marland Kitchen lisinopril (PRINIVIL,ZESTRIL) 5 MG tablet TAKE 1 TABLET BY MOUTH  DAILY 90 tablet 3  . metoprolol tartrate (LOPRESSOR) 25 MG tablet Take 25 mg by mouth 2 (two) times daily.    . nitroGLYCERIN (NITROSTAT) 0.4 MG SL tablet DISSOLVE UNDER THE TONGUE 1 TABLET EVERY 5 MINUTES AS NEEDED FOR CHEST PAIN 25 tablet 3  . OMEGA 3-6-9 FATTY ACIDS PO Take by mouth.    . Omega-3 Fatty Acids (FISH OIL) 1000 MG CAPS Take 1,000 mg by mouth daily.    . pravastatin (PRAVACHOL) 40 MG tablet Take 1 tablet (40 mg total) by mouth at bedtime. 90 tablet 3  . Tamsulosin HCl (FLOMAX) 0.4 MG CAPS Take 0.4 mg by mouth daily.    Alveda Reasons 20 MG TABS tablet TAKE 1 TABLET DAILY 90 tablet 3   No current facility-administered medications for this visit.      Allergies:   Bee venom; Sulfa antibiotics; and Warfarin and related   Social History:  The patient  reports that  has never smoked. he has never used smokeless tobacco. He reports that he does not  drink alcohol or use drugs.   Family History:   family history includes Colon cancer in his father; Stroke in his mother.    Review of Systems: Review of Systems  Constitutional: Negative.   Respiratory: Positive for shortness of breath.   Cardiovascular: Positive for chest pain.  Gastrointestinal: Negative.   Musculoskeletal: Negative.   Neurological: Negative.   Psychiatric/Behavioral: Negative.   All other systems reviewed and are negative.    PHYSICAL EXAM: VS:  BP 130/80 (BP Location: Left Arm, Patient Position: Sitting, Cuff Size: Normal)   Pulse (!) 57   Ht 6\' 4"  (1.93 m)   Wt 218 lb (98.9 kg)   BMI 26.54 kg/m  , BMI Body mass index is 26.54 kg/m.  No significant change in physical exam GEN: Well nourished, well developed, in no acute distress  HEENT: normal  Neck: no JVD, carotid bruits, or masses Cardiac: RRR; no murmurs, rubs, or gallops,no edema  Respiratory:  clear to auscultation bilaterally, normal work of breathing GI: soft, nontender, nondistended, + BS MS: no deformity or atrophy  Skin: warm and dry, no rash Neuro:  Strength and sensation are intact Psych: euthymic mood, full affect    Recent Labs: 03/07/2017: ALT 16 03/08/2017: BUN 20; Creatinine, Ser 1.27; Hemoglobin 12.9; Platelets 192; Potassium 3.7; Sodium 139    Lipid Panel Lab Results  Component Value Date   CHOL 110 06/28/2016   HDL 29.90 (L) 06/28/2016   LDLCALC 64 06/28/2016   TRIG 80.0 06/28/2016      Wt Readings from Last 3 Encounters:  09/15/17 218 lb (98.9 kg)  07/16/17 220 lb 6.4 oz (100 kg)  03/24/17 225 lb 9.6 oz (102.3 kg)       ASSESSMENT AND PLAN:   Mixed hyperlipidemia - Plan: EKG 12-Lead Cholesterol is at goal on the current lipid regimen. No changes to the medications were made.  Stable  Coronary artery disease involving native coronary artery of native heart without angina pectoris - Plan: EKG 12-Lead  having chest pain on the left and shortness of breath   Unable to exclude ischemia Stress Myoview ordered, Lexiscan.  He is unable to treadmill  Given arthritic issues  Atrial fibrillation, unspecified type ([place]) - Plan: EKG 12-Lead  on Xarelto He is on metoprolol and diltiazem 120, amiodarone  Congestive heart failure, unspecified congestive heart failure chronicity, unspecified congestive heart failure type (HCC) -  Appears euvolemic, less likely cause of his shortness of  breath  Chronic kidney disease (CKD), stage III (moderate)  recommended he avoid NSAIDs Stable  Iron deficiency anemia, unspecified iron deficiency anemia type Stable Routine lab work with primary care.   If shortness of breath gets worse, would order CBC before visit with primary care He is on iron  Orthostatic hypotension Denies having symptoms after previous medication changes, Imdur held, decrease dose of diltiazem   Total encounter time more than 25 minutes  Greater than 50% was spent in counseling and coordination of care with the patient   Disposition:   F/U 12 months   Orders Placed This Encounter  Procedures  . EKG 12-Lead     Signed, Esmond Plants, M.D., Ph.D. 09/15/2017  [place], [place]

## 2017-09-15 ENCOUNTER — Encounter: Payer: Self-pay | Admitting: Cardiovascular Disease

## 2017-09-15 ENCOUNTER — Ambulatory Visit: Payer: Medicare Other | Admitting: Cardiovascular Disease

## 2017-09-15 VITALS — BP 130/80 | HR 57 | Ht 76.0 in | Wt 218.0 lb

## 2017-09-15 DIAGNOSIS — I25118 Atherosclerotic heart disease of native coronary artery with other forms of angina pectoris: Secondary | ICD-10-CM

## 2017-09-15 DIAGNOSIS — R0602 Shortness of breath: Secondary | ICD-10-CM

## 2017-09-15 DIAGNOSIS — I480 Paroxysmal atrial fibrillation: Secondary | ICD-10-CM

## 2017-09-15 DIAGNOSIS — E782 Mixed hyperlipidemia: Secondary | ICD-10-CM

## 2017-09-15 DIAGNOSIS — I2782 Chronic pulmonary embolism: Secondary | ICD-10-CM | POA: Diagnosis not present

## 2017-09-15 DIAGNOSIS — D509 Iron deficiency anemia, unspecified: Secondary | ICD-10-CM | POA: Diagnosis not present

## 2017-09-15 DIAGNOSIS — N183 Chronic kidney disease, stage 3 unspecified: Secondary | ICD-10-CM

## 2017-09-15 NOTE — Patient Instructions (Addendum)
Medication Instructions:   No medication changes made  Labwork:  No new labs needed  Testing/Procedures:  We will order a lexiscan myoview for chest pain, shortness of breath, known CAD with prior stent  -Hold the diltiazem and metoprolol the night before the test and morning of the test -No food morning of the test -No caffeine 24 hr before the test  [place]  Your caregiver has ordered a Stress Test with nuclear imaging. The purpose of this test is to evaluate the blood supply to your heart muscle. This procedure is referred to as a "Non-Invasive Stress Test." This is because other than having an IV started in your vein, nothing is inserted or "invades" your body. Cardiac stress tests are done to find areas of poor blood flow to the heart by determining the extent of coronary artery disease (CAD). Some patients exercise on a treadmill, which naturally increases the blood flow to your heart, while others who are  unable to walk on a treadmill due to physical limitations have a pharmacologic/chemical stress agent called Lexiscan . This medicine will mimic walking on a treadmill by temporarily increasing your coronary blood flow.   Please note: these test may take anywhere between 2-4 hours to complete  PLEASE REPORT TO [facility] AT THE FIRST DESK WILL DIRECT YOU WHERE TO GO  Date of Procedure:__Friday February 1st_____  Arrival Time for Procedure:___07:45AM__  Instructions regarding medication:   __X__:  Hold Metoprolol and Diltiazem the night before procedure and morning of procedure.    PLEASE NOTIFY THE OFFICE AT LEAST 61 HOURS IN ADVANCE IF YOU ARE UNABLE TO KEEP YOUR APPOINTMENT.  [phone] AND  PLEASE NOTIFY NUCLEAR MEDICINE AT [facility] AT LEAST 24 HOURS IN ADVANCE IF YOU ARE UNABLE TO KEEP YOUR APPOINTMENT. [phone]  How to prepare for your Myoview test:  1. Do not eat or drink after midnight 2. No caffeine for 24 hours prior to  test 3. No smoking 24 hours prior to test. 4. Your medication may be taken with water.  If your doctor stopped a medication because of this test, do not take that medication. 5. Ladies, please do not wear dresses.  Skirts or pants are appropriate. Please wear a short sleeve shirt. 6. No perfume, cologne or lotion. 7. Wear comfortable walking shoes. No heels!    Follow-Up: It was a pleasure seeing you in the office today. Please call us if you have new issues that need to be addressed before your next appt.  [phone]  Your physician wants you to follow-up in: 12 months.  You will receive a reminder letter in the mail two months in advance. If you don't receive a letter, please call our office to schedule the follow-up appointment.  If you need a refill on your cardiac medications before your next appointment, please call your pharmacy.

## 2017-09-19 ENCOUNTER — Ambulatory Visit
Admission: RE | Admit: 2017-09-19 | Discharge: 2017-09-19 | Disposition: A | Discharge status: Home / Self Care | Payer: Medicare HMO | Source: Ambulatory Visit | Attending: Cardiovascular Disease | Admitting: Cardiovascular Disease

## 2017-09-19 DIAGNOSIS — R0602 Shortness of breath: Secondary | ICD-10-CM | POA: Diagnosis not present

## 2017-09-19 LAB — NM MYOCAR MULTI W/SPECT W/WALL MOTION / EF
CHL CUP NUCLEAR SDS: 0
CHL CUP NUCLEAR SRS: 13
CHL CUP RESTING HR STRESS: 57 {beats}/min
CSEPPHR: 74 {beats}/min
LV dias vol: 68 mL (ref 62–150)
LVSYSVOL: 21 mL
NUC STRESS TID: 1
Percent HR: 54 %
SSS: 5

## 2017-09-19 MED ORDER — TECHNETIUM TC 99M TETROFOSMIN IV KIT
32.4700 | PACK | Freq: Once | INTRAVENOUS | Status: AC | PRN
  Administered 2017-09-19: 32.47 via INTRAVENOUS

## 2017-09-19 MED ORDER — REGADENOSON 0.4 MG/5ML IV SOLN
0.4000 mg | Freq: Once | INTRAVENOUS | Status: AC
  Administered 2017-09-19: 0.4 mg via INTRAVENOUS

## 2017-09-19 MED ORDER — TECHNETIUM TC 99M TETROFOSMIN IV KIT
13.8500 | PACK | Freq: Once | INTRAVENOUS | Status: AC | PRN
  Administered 2017-09-19: 13.85 via INTRAVENOUS

## 2017-09-24 ENCOUNTER — Telehealth: Payer: Self-pay

## 2017-09-24 NOTE — Telephone Encounter (Signed)
He needs to go to urgent care or the ED given his shortness of breath at rest. Please inform him of this. Thanks.

## 2017-09-24 NOTE — Telephone Encounter (Signed)
Called patients wife back and advised per Dr Ellen Henri recommendations he needed to go to urgent care tonight.   I advised her where local urgent care locations were.   She stated that she couldn't force him to go.  I again urged her to take him due to symptoms that were present , cough , actively wheezing.  I advised her again that no appointment were avaiaible in this office.  She verbalized understanding.

## 2017-09-24 NOTE — Telephone Encounter (Signed)
Copied from [place]. Topic: Appointment Scheduling - Same Day Appointment >> Sep 24, 2017  1:53 PM Margot Ables wrote: Patient called to schedule an appointment for TODAY with Dr. Caryl Bis. Pt wife called in stating he has been sick x 1 week. They saw cardiology recent and he had pt do a stress test due to wheezing and shortness of breath. Results were within normal range. Pt will only accept appt with Dr. Caryl Bis. Please advise.   >> Sep 24, 2017  2:11 PM Yvette Rack wrote: Pt wife called back stating that her husband would see a PA if he has to

## 2017-09-24 NOTE — Telephone Encounter (Signed)
Reason for call: wheezing  Symptoms: wheezing, chest /nasal congestion , shortness of breat not when walkingh with wheezing at night when laying down, no fever  Duration 1 week  Medications: delysm  Last seen for this problem: Seen by: No appointments until Friday, ? Patient to go to urgent care ,? unsure

## 2017-09-24 NOTE — Telephone Encounter (Signed)
Please advise 

## 2017-09-24 NOTE — Telephone Encounter (Signed)
Noted. Please follow-up with the patient on Thursday to see if he got evaluated. If he did not you could provide him with an appointment with Gregary Signs. Thanks.

## 2017-09-25 ENCOUNTER — Ambulatory Visit: Payer: Medicare HMO | Admitting: Family Medicine

## 2017-09-25 ENCOUNTER — Encounter: Payer: Self-pay | Admitting: Family Medicine

## 2017-09-25 ENCOUNTER — Other Ambulatory Visit: Payer: Self-pay

## 2017-09-25 VITALS — BP 120/72 | HR 58 | Temp 97.5°F | Ht 76.0 in | Wt 215.2 lb

## 2017-09-25 DIAGNOSIS — J40 Bronchitis, not specified as acute or chronic: Secondary | ICD-10-CM

## 2017-09-25 MED ORDER — PREDNISONE 10 MG PO TABS: ORAL_TABLET | ORAL | 0 refills | Status: DC

## 2017-09-25 NOTE — Telephone Encounter (Signed)
Spoke with patients wife this am states they did go to [facility] urgent care last night and they waited and waited however he  was never seen.  She stated he didn't cough as much last night .  I told her I would thought it would be better to have him evaluated by the nurse practioners since his symptoms had been going on so long . I placed him on Gregary Signs NP schedule for 3:15pm today

## 2017-09-25 NOTE — Progress Notes (Signed)
Patient ID: SANKALP FERRELL, male   DOB: March 15, 1930, 84 y.o.   MRN: [id number]   PCP: [name], MD  Subjective:  William Taylor is a 84 y.o. year old very pleasant male patient who presents with symptoms including mild nasal congestion, sinus pressure, and cough that is nonproductive. He reports that cough was minimally productive previously but this has improved. Associated wheezing was noted by wife per patient. -started: 10 days ago, symptoms are improving -previous treatments: No -sick contacts/travel/risks: denies flu exposure. Recent sick contact exposure with wife. Influenza vaccine is UTD -Hx of: allergies seasonal in the fall He is not a smoker  ROS-denies fever, SOB, NVD, tooth pain  Pertinent Past Medical History- CAD, Paroxysmal A-fib, HTN, CKD Stage III  Medications- reviewed  Current Outpatient Medications  Medication Sig Dispense Refill  . amiodarone (PACERONE) 200 MG tablet Take 1 tablet (200 mg total) by mouth daily as needed. 90 tablet 4  . cyproheptadine (PERIACTIN) 4 MG tablet Take 1 tablet (4 mg total) by mouth 3 (three) times daily as needed for allergies. 90 tablet 3  . diltiazem (CARDIZEM CD) 120 MG 24 hr capsule Take 1 capsule (120 mg total) by mouth daily. 90 capsule 4  . esomeprazole (NEXIUM) 40 MG capsule TAKE 1 CAPSULE BY MOUTH  DAILY AT 12 NOON 90 capsule 3  . fenofibrate micronized (ANTARA) 130 MG capsule TAKE 1 CAPSULE DAILY BEFORE BREAKFAST 90 capsule 1  . finasteride (PROSCAR) 5 MG tablet Take 5 mg by mouth daily.      Marland Kitchen levobunolol (BETAGAN) 0.5 % ophthalmic solution Place 1 drop into the right eye 2 (two) times daily.     Marland Kitchen lisinopril (PRINIVIL,ZESTRIL) 5 MG tablet TAKE 1 TABLET BY MOUTH  DAILY 90 tablet 3  . metoprolol tartrate (LOPRESSOR) 25 MG tablet Take 25 mg by mouth 2 (two) times daily.    . nitroGLYCERIN (NITROSTAT) 0.4 MG SL tablet DISSOLVE UNDER THE TONGUE 1 TABLET EVERY 5 MINUTES AS NEEDED FOR CHEST PAIN 25 tablet 3  . OMEGA 3-6-9  FATTY ACIDS PO Take by mouth.    . Omega-3 Fatty Acids (FISH OIL) 1000 MG CAPS Take 1,000 mg by mouth daily.    . pravastatin (PRAVACHOL) 40 MG tablet Take 1 tablet (40 mg total) by mouth at bedtime. 90 tablet 3  . Tamsulosin HCl (FLOMAX) 0.4 MG CAPS Take 0.4 mg by mouth daily.    Alveda Reasons 20 MG TABS tablet TAKE 1 TABLET DAILY 90 tablet 3  . ferrous sulfate 325 (65 FE) MG tablet Take 1 tablet (325 mg total) by mouth 2 (two) times daily with a meal. (Patient taking differently: Take 325 mg by mouth daily with breakfast. ) 124 tablet 2   No current facility-administered medications for this visit.     Objective: BP 120/72   Pulse (!) 58   Temp (!) 97.5 F (36.4 C) (Oral)   Ht 6\' 4"  (1.93 m)   Wt 215 lb 3.2 oz (97.6 kg)   SpO2 95%   BMI 26.19 kg/m  Gen: NAD, resting comfortably HEENT: Turbinates mildly erythematous, TMs normal bilaterally, oropharynx clear and moist, no sinus tenderness CV: RRR no murmurs rubs or gallops Lungs: Effort normal, no respiratory distress, few faint scattered wheezes present, no crackles or rhonchi Abdomen: soft/nontender/nondistended/normal bowel sounds. No rebound or guarding.  Ext: no edema Skin: warm, dry, no rash Neuro: grossly normal, moves all extremities  Assessment/Plan: 1. Bronchitis Symptoms are improving; few faint scattered wheezes present; patient  reports that he feels much better and considered cancelling appointment however his wife was concerned for him. Patient's symptoms most likely associated with bronchitis that is resolving. We discussed that this is most likely viral and that cough can persist for 2 to 3 weeks. O2 sat 95%, no SOB with ambulation or rest during appointment and no focal findings during lung exam are reassuring. We reviewed treatment options and patient is in agreement with supportive measures and a short course of prednisone for symptom improvement. As cough is much better, we reviewed reasons for follow up and advised  close monitoring of symptoms. He will follow up if symptoms do not continue to improve, worsen, or new symptoms develop.   - predniSONE (DELTASONE) 10 MG tablet; Take 4 tablets once daily for 2 days, 3 tabs daily for 2 days, 2 tabs daily for 2 days, 1 tab daily for 2 days.  Dispense: 20 tablet; Refill: 0  We discussed that we did not find any infection that had higher probability of being bacterial such as pneumonia or strep throat. We discussed signs that bacterial infection may have developed particularly fever or shortness of breath.    Finally, we reviewed reasons to return to care including if symptoms worsen or persist or new concerns arise- once again particularly shortness of breath or fever.    Laurita Quint, FNP

## 2017-09-25 NOTE — Patient Instructions (Signed)
Please take medication with food as directed and follow up if symptoms do not continue to improve, worsen, or yo develop new symptoms such as fever and shortness of breath. Again, if symptoms do not resolve, please follow up with your provider for further evaluation and treatment.   Acute Bronchitis, Adult Acute bronchitis is when air tubes (bronchi) in the lungs suddenly get swollen. The condition can make it hard to breathe. It can also cause these symptoms:  A cough.  Coughing up clear, yellow, or green mucus.  Wheezing.  Chest congestion.  Shortness of breath.  A fever.  Body aches.  Chills.  A sore throat.  Follow these instructions at home: Medicines  Take over-the-counter and prescription medicines only as told by your doctor.  If you were prescribed an antibiotic medicine, take it as told by your doctor. Do not stop taking the antibiotic even if you start to feel better. General instructions  Rest.  Drink enough fluids to keep your pee (urine) clear or pale yellow.  Avoid smoking and secondhand smoke. If you smoke and you need help quitting, ask your doctor. Quitting will help your lungs heal faster.  Use an inhaler, cool mist vaporizer, or humidifier as told by your doctor.  Keep all follow-up visits as told by your doctor. This is important. How is this prevented? To lower your risk of getting this condition again:  Wash your hands often with soap and water. If you cannot use soap and water, use hand sanitizer.  Avoid contact with people who have cold symptoms.  Try not to touch your hands to your mouth, nose, or eyes.  Make sure to get the flu shot every year.  Contact a doctor if:  Your symptoms do not get better in 2 weeks. Get help right away if:  You cough up blood.  You have chest pain.  You have very bad shortness of breath.  You become dehydrated.  You faint (pass out) or keep feeling like you are going to pass out.  You keep  throwing up (vomiting).  You have a very bad headache.  Your fever or chills gets worse. This information is not intended to replace advice given to you by your health care provider. Make sure you discuss any questions you have with your health care provider. Document Released: 01/22/2008 Document Revised: 03/13/2016 Document Reviewed: 01/24/2016 Elsevier Interactive Patient Education  [employer].

## 2017-09-30 DIAGNOSIS — M5137 Other intervertebral disc degeneration, lumbosacral region: Secondary | ICD-10-CM | POA: Diagnosis not present

## 2017-09-30 DIAGNOSIS — M9903 Segmental and somatic dysfunction of lumbar region: Secondary | ICD-10-CM | POA: Diagnosis not present

## 2017-10-15 ENCOUNTER — Encounter: Payer: Self-pay | Admitting: Gastroenterology

## 2017-10-15 ENCOUNTER — Other Ambulatory Visit
Admission: RE | Admit: 2017-10-15 | Discharge: 2017-10-15 | Disposition: A | Discharge status: Home / Self Care | Payer: Medicare HMO | Source: Ambulatory Visit | Attending: Gastroenterology | Admitting: Gastroenterology

## 2017-10-15 ENCOUNTER — Ambulatory Visit: Payer: Medicare HMO | Admitting: Gastroenterology

## 2017-10-15 VITALS — BP 121/73 | HR 62 | Temp 97.5°F | Ht 76.0 in | Wt 217.6 lb

## 2017-10-15 DIAGNOSIS — D509 Iron deficiency anemia, unspecified: Secondary | ICD-10-CM

## 2017-10-15 LAB — FERRITIN: Ferritin: 447 ng/mL — ABNORMAL HIGH (ref 24–336)

## 2017-10-15 LAB — CBC WITH DIFFERENTIAL/PLATELET
BASOS ABS: 0 10*3/uL (ref 0–0.1)
BASOS PCT: 1 %
EOS ABS: 0 10*3/uL (ref 0–0.7)
Eosinophils Relative: 1 %
HCT: 31.5 % — ABNORMAL LOW (ref 40.0–52.0)
Hemoglobin: 10.7 g/dL — ABNORMAL LOW (ref 13.0–18.0)
Lymphocytes Relative: 17 %
Lymphs Abs: 1.2 10*3/uL (ref 1.0–3.6)
MCH: 34.2 pg — ABNORMAL HIGH (ref 26.0–34.0)
MCHC: 33.8 g/dL (ref 32.0–36.0)
MCV: 101.1 fL — ABNORMAL HIGH (ref 80.0–100.0)
MONO ABS: 0.5 10*3/uL (ref 0.2–1.0)
Monocytes Relative: 7 %
Neutro Abs: 5.2 10*3/uL (ref 1.4–6.5)
Neutrophils Relative %: 74 %
PLATELETS: 250 10*3/uL (ref 150–440)
RBC: 3.12 MIL/uL — ABNORMAL LOW (ref 4.40–5.90)
RDW: 13.8 % (ref 11.5–14.5)
WBC: 7 10*3/uL (ref 3.8–10.6)

## 2017-10-15 LAB — IRON AND TIBC
IRON: 109 ug/dL (ref 45–182)
SATURATION RATIOS: 28 % (ref 17.9–39.5)
TIBC: 390 ug/dL (ref 250–450)
UIBC: 281 ug/dL

## 2017-10-15 NOTE — Progress Notes (Signed)
[name] MD, MRCP(U.K) [address]  [place]  [place], [place] [address]  Main: [phone]  Fax: [phone]   Primary Care Physician: [name], MD  Primary Gastroenterologist:  Dr. [name]   No chief complaint on file.   HPI: William Taylor is a 84 y.o. male   Summary of history : He has a history of CAD,stent placment in 2005 , Atrial fibrillation 2011, On anticoagulation . At a visit to see his cardiologist Dr Rockey Situ , reported symptoms of shortness of breath, fatigue ,exertion . He was noted to have a drop in his Hb , stool was positive for blood . He was on xarelto. He has external hemorroids. 06/2016 Ferritin 9 , TIBC elevated. 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 10/03/16- capsule study showed no bleeding but a few small polyps were seen .  Interval history 03/2017 -10/15/17   On iron , doing well. No complaints.   CBC Latest Ref Rng & Units 03/08/2017 03/07/2017 12/10/2016  WBC 3.8 - 10.6 K/uL 7.6 7.3 7.1  Hemoglobin 13.0 - 18.0 g/dL 12.9(L) 13.9 12.7(L)  Hematocrit 40.0 - 52.0 % 37.6(L) 40.3 37.6(L)  Platelets 150 - 440 K/uL 192 220 220        Current Outpatient Medications  Medication Sig Dispense Refill  . amiodarone (PACERONE) 200 MG tablet Take 1 tablet (200 mg total) by mouth daily as needed. 90 tablet 4  . cyproheptadine (PERIACTIN) 4 MG tablet Take 1 tablet (4 mg total) by mouth 3 (three) times daily as needed for allergies. 90 tablet 3  . diltiazem (CARDIZEM CD) 120 MG 24 hr capsule Take 1 capsule (120 mg total) by mouth daily. 90 capsule 4  . esomeprazole (NEXIUM) 40 MG capsule TAKE 1 CAPSULE BY MOUTH  DAILY AT 12 NOON 90 capsule 3  . fenofibrate micronized (ANTARA) 130 MG capsule TAKE 1 CAPSULE DAILY BEFORE BREAKFAST 90  capsule 1  . ferrous sulfate 325 (65 FE) MG tablet Take 1 tablet (325 mg total) by mouth 2 (two) times daily with a meal. (Patient taking differently: Take 325 mg by mouth daily with breakfast. ) 124 tablet 2  . finasteride (PROSCAR) 5 MG tablet Take 5 mg by mouth daily.      Marland Kitchen levobunolol (BETAGAN) 0.5 % ophthalmic solution Place 1 drop into the right eye 2 (two) times daily.     Marland Kitchen lisinopril (PRINIVIL,ZESTRIL) 5 MG tablet TAKE 1 TABLET BY MOUTH  DAILY 90 tablet 3  . metoprolol tartrate (LOPRESSOR) 25 MG tablet Take 25 mg by mouth 2 (two) times daily.    . nitroGLYCERIN (NITROSTAT) 0.4 MG SL tablet DISSOLVE UNDER THE TONGUE 1 TABLET EVERY 5 MINUTES AS NEEDED FOR CHEST PAIN 25 tablet 3  . OMEGA 3-6-9 FATTY ACIDS PO Take by mouth.    . Omega-3 Fatty Acids (FISH OIL) 1000 MG CAPS Take 1,000 mg by mouth daily.    . pravastatin (PRAVACHOL) 40 MG tablet Take 1 tablet (40 mg total) by mouth at bedtime. 90 tablet 3  . predniSONE (DELTASONE) 10 MG tablet Take 4 tablets once daily for 2 days, 3 tabs daily for 2 days, 2 tabs daily for 2 days, 1 tab daily for 2 days. 20 tablet 0  . Tamsulosin HCl (FLOMAX) 0.4 MG CAPS Take 0.4 mg by mouth daily.    Marland Kitchen  XARELTO 20 MG TABS tablet TAKE 1 TABLET DAILY 90 tablet 3   No current facility-administered medications for this visit.     Allergies as of 10/15/2017 - Review Complete 09/25/2017  Allergen Reaction Noted  . Bee venom Swelling 07/17/2015  . Sulfa antibiotics Hives and Swelling 01/22/2016  . Warfarin and related Other (See Comments) 05/18/2012    ROS:  General: Negative for anorexia, weight loss, fever, chills, fatigue, weakness. ENT: Negative for hoarseness, difficulty swallowing , nasal congestion. CV: Negative for chest pain, angina, palpitations, dyspnea on exertion, peripheral edema.  Respiratory: Negative for dyspnea at rest, dyspnea on exertion, cough, sputum, wheezing.  GI: See history of present illness. GU:  Negative for dysuria, hematuria,  urinary incontinence, urinary frequency, nocturnal urination.  Endo: Negative for unusual weight change.    Physical Examination:   There were no vitals taken for this visit.  General: Well-nourished, well-developed in no acute distress.  Eyes: No icterus. Conjunctivae pink. Mouth: Oropharyngeal mucosa moist and pink , no lesions erythema or exudate. Lungs: Clear to auscultation bilaterally. Non-labored. Heart: Regular rate and rhythm, no murmurs rubs or gallops.  Abdomen: Bowel sounds are normal, nontender, nondistended, no hepatosplenomegaly or masses, no abdominal bruits or hernia , no rebound or guarding.   Extremities: No lower extremity edema. No clubbing or deformities. Neuro: Alert and oriented x 3.  Grossly intact. Skin: Warm and dry, no jaundice.   Psych: Alert and cooperative, normal mood and affect.   Imaging Studies: Nm Myocar Multi W/spect W/wall Motion / Ef  Result Date: 09/19/2017 Pharmacological myocardial perfusion imaging study with no significant  ischemia Normal wall motion, EF estimated at 69% No EKG changes concerning for ischemia at peak stress or in recovery. Low risk scan Signed, Esmond Plants, MD, Ph.D [facility]    Assessment and Plan:   William Taylor is a 84 y.o. y/o male here for follow up for iron deficiency anemia. Commenced him on oral iron in 07/2016. Hb and iron studies had normalized. Capsule study showed 4 small tiny small bowel polyps which were non bleeding . Long discussion with possible options of doing nothing vs small bowel enteroscopy, he was not keen on enteroscopy .   Plan  1. Repeat CBC, iron studies  if normal will plan to stop iron and monitor   Dr [name]  MD,MRCP [facility]) Follow up in 6 months

## 2017-10-17 ENCOUNTER — Other Ambulatory Visit: Payer: Self-pay

## 2017-10-17 ENCOUNTER — Telehealth: Payer: Self-pay

## 2017-10-17 DIAGNOSIS — D508 Other iron deficiency anemias: Secondary | ICD-10-CM

## 2017-10-17 NOTE — Progress Notes (Signed)
Patient aware of lab order.

## 2017-10-17 NOTE — Telephone Encounter (Signed)
Left a message on home phone for patient callback for results.  LVM on cell phone for the same.   - 1. He is anemic but his iron studies are normal . So its not iron deficiency- at this time stay on iron  2. Check b12,folate levels and depending on that will decide on next step

## 2017-10-17 NOTE — Telephone Encounter (Signed)
Advised patient of results per Dr. [name].    - 1. He is anemic but his iron studies are normal . So its not iron deficiency- at this time stay on iron  2. Check b12,folate levels and depending on that will decide on next step   Patient states, "OK"  Gave notification to [place] to schedule follow-up visit in 6 - 8 weeks. Advise patient that labs have been ordered and he's to complete them 1 week prior to his office visit.

## 2017-10-21 ENCOUNTER — Other Ambulatory Visit: Payer: Self-pay | Admitting: *Deleted

## 2017-10-21 MED ORDER — METOPROLOL TARTRATE 25 MG PO TABS: 25.0000 mg | ORAL_TABLET | Freq: Two times a day (BID) | ORAL | 3 refills | Status: DC

## 2017-10-21 NOTE — Progress Notes (Signed)
Patient was in with wife today at her visit with Dr. Rockey Situ. He reports that mail order pharmacy does not have refills for his medication. Discussed medication and dosage with patient and refill sent in per his request.

## 2017-10-28 DIAGNOSIS — S39012A Strain of muscle, fascia and tendon of lower back, initial encounter: Secondary | ICD-10-CM | POA: Diagnosis not present

## 2017-10-28 DIAGNOSIS — M9902 Segmental and somatic dysfunction of thoracic region: Secondary | ICD-10-CM | POA: Diagnosis not present

## 2017-10-28 DIAGNOSIS — M9903 Segmental and somatic dysfunction of lumbar region: Secondary | ICD-10-CM | POA: Diagnosis not present

## 2017-10-28 DIAGNOSIS — S233XXA Sprain of ligaments of thoracic spine, initial encounter: Secondary | ICD-10-CM | POA: Diagnosis not present

## 2017-12-02 DIAGNOSIS — M5136 Other intervertebral disc degeneration, lumbar region: Secondary | ICD-10-CM | POA: Diagnosis not present

## 2017-12-02 DIAGNOSIS — M9902 Segmental and somatic dysfunction of thoracic region: Secondary | ICD-10-CM | POA: Diagnosis not present

## 2017-12-02 DIAGNOSIS — M5134 Other intervertebral disc degeneration, thoracic region: Secondary | ICD-10-CM | POA: Diagnosis not present

## 2017-12-02 DIAGNOSIS — M9903 Segmental and somatic dysfunction of lumbar region: Secondary | ICD-10-CM | POA: Diagnosis not present

## 2017-12-08 ENCOUNTER — Other Ambulatory Visit
Admission: RE | Admit: 2017-12-08 | Discharge: 2017-12-08 | Disposition: A | Discharge status: Home / Self Care | Payer: Medicare HMO | Source: Ambulatory Visit | Attending: Gastroenterology | Admitting: Gastroenterology

## 2017-12-08 DIAGNOSIS — D509 Iron deficiency anemia, unspecified: Secondary | ICD-10-CM | POA: Diagnosis present

## 2017-12-08 LAB — VITAMIN B12: VITAMIN B 12: 321 pg/mL (ref 180–914)

## 2017-12-08 LAB — FOLATE: Folate: 10 ng/mL (ref >5.9)

## 2017-12-15 ENCOUNTER — Encounter: Payer: Self-pay | Admitting: Gastroenterology

## 2017-12-15 ENCOUNTER — Ambulatory Visit: Payer: Medicare HMO | Admitting: Gastroenterology

## 2017-12-15 VITALS — BP 129/78 | HR 62 | Ht 76.0 in | Wt 220.0 lb

## 2017-12-15 DIAGNOSIS — D509 Iron deficiency anemia, unspecified: Secondary | ICD-10-CM | POA: Diagnosis not present

## 2017-12-15 NOTE — Progress Notes (Signed)
William Bellows MD, MRCP(U.K) [address]  [place]  [place], [place] [address]  Main: [phone]  Fax: [phone]   Primary Care Physician: [name], MD  Primary Gastroenterologist:  Dr. Jonathon Taylor   Chief Complaint  Patient presents with  . Follow-up    HPI: William Taylor is a 84 y.o. male   Summary of history : He has a history of CAD,stent placment in 2005 , Atrial fibrillation 2011, On anticoagulation . Atavisit to see his cardiologist Dr Rockey Situ , reported symptoms of shortness of breath, fatigue ,exertion . He was noted to have a drop in his Hb , stool was positive for blood . He wason xarelto. He has external hemorroids. 06/2016 Ferritin 9 , TIBC elevated. 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 10/03/16- capsule study showedno bleeding but a few small polyps were seen .  Interval history 10/15/17 -12/15/17   09/2017 : iron studies were normal . 11/2017 b12 and folate normal.   On xarelto - no blood in his stool. . On oral iron one tablet a day . No blood in the stool.  On iron , doing well. No complaints except for fatigue for 5 months.     Current Outpatient Medications  Medication Sig Dispense Refill  . amiodarone (PACERONE) 200 MG tablet Take 1 tablet (200 mg total) by mouth daily as needed. 90 tablet 4  . cyproheptadine (PERIACTIN) 4 MG tablet Take 1 tablet (4 mg total) by mouth 3 (three) times daily as needed for allergies. 90 tablet 3  . diltiazem (CARDIZEM CD) 120 MG 24 hr capsule Take 1 capsule (120 mg total) by mouth daily. 90 capsule 4  . esomeprazole (NEXIUM) 40 MG capsule TAKE 1 CAPSULE BY MOUTH  DAILY AT 12 NOON 90 capsule 3  . fenofibrate micronized (ANTARA) 130 MG capsule TAKE 1 CAPSULE DAILY BEFORE BREAKFAST 90 capsule 1  .  finasteride (PROSCAR) 5 MG tablet Take 5 mg by mouth daily.      Marland Kitchen levobunolol (BETAGAN) 0.5 % ophthalmic solution Place 1 drop into the right eye 2 (two) times daily.     Marland Kitchen lisinopril (PRINIVIL,ZESTRIL) 5 MG tablet TAKE 1 TABLET BY MOUTH  DAILY 90 tablet 3  . metoprolol tartrate (LOPRESSOR) 25 MG tablet Take 1 tablet (25 mg total) by mouth 2 (two) times daily. 180 tablet 3  . nitroGLYCERIN (NITROSTAT) 0.4 MG SL tablet DISSOLVE UNDER THE TONGUE 1 TABLET EVERY 5 MINUTES AS NEEDED FOR CHEST PAIN 25 tablet 3  . Omega-3 Fatty Acids (FISH OIL) 1000 MG CAPS Take 1,000 mg by mouth daily.    . pravastatin (PRAVACHOL) 40 MG tablet Take 1 tablet (40 mg total) by mouth at bedtime. 90 tablet 3  . Tamsulosin HCl (FLOMAX) 0.4 MG CAPS Take 0.4 mg by mouth daily.    Alveda Reasons 20 MG TABS tablet TAKE 1 TABLET DAILY 90 tablet 3  . ferrous sulfate 325 (65 FE) MG tablet Take 1 tablet (325 mg total) by mouth 2 (two) times daily with a meal. (Patient taking differently: Take 325 mg by mouth daily with breakfast. ) 124 tablet 2   No current facility-administered medications for this visit.     Allergies as of 12/15/2017 - Review Complete 10/15/2017  Allergen Reaction Noted  . Bee venom Swelling 07/17/2015  . Sulfasalazine Hives and  Swelling 01/22/2016  . Sulfa antibiotics Hives and Swelling 01/22/2016  . Warfarin and related Other (See Comments) 05/18/2012    ROS:  General: Negative for anorexia, weight loss, fever, chills, fatigue, weakness. ENT: Negative for hoarseness, difficulty swallowing , nasal congestion. CV: Negative for chest pain, angina, palpitations, dyspnea on exertion, peripheral edema.  Respiratory: Negative for dyspnea at rest, dyspnea on exertion, cough, sputum, wheezing.  GI: See history of present illness. GU:  Negative for dysuria, hematuria, urinary incontinence, urinary frequency, nocturnal urination.  Endo: Negative for unusual weight change.    Physical Examination:   BP 129/78 (BP  Location: Left Arm, Patient Position: Sitting, Cuff Size: Large)   Pulse 62   Ht 6\' 4"  (1.93 m)   Wt 220 lb (99.8 kg)   BMI 26.78 kg/m   General: Well-nourished, well-developed in no acute distress.  Eyes: No icterus. Conjunctivae pink. Mouth: Oropharyngeal mucosa moist and pink , no lesions erythema or exudate. Lungs: Clear to auscultation bilaterally. Non-labored. Heart: Regular rate and rhythm, no murmurs rubs or gallops.  Abdomen: Bowel sounds are normal, nontender, nondistended, no hepatosplenomegaly or masses, no abdominal bruits or hernia , no rebound or guarding.   Extremities: No lower extremity edema. No clubbing or deformities. Neuro: Alert and oriented x 3.  Grossly intact. Skin: Warm and dry, no jaundice.   Psych: Alert and cooperative, normal mood and affect.   Imaging Studies: No results found.  Assessment and Plan:   William Taylor is a 84 y.o. y/o male here for follow up for iron deficiency anemia. Commenced him on oral iron in 07/2016. [place] iron studies had normalized.Capsule study showed 4 small tiny small bowel polyps which were non bleeding . He was not keen on enteroscopy . Labs in 09/2017 showed normal iron studies with low Hb, b12 and folate normal   Plan  1. Repeat CBC if still low may need to get evaluated ?MDS cause of anemia , if normal suggets evaluating if any of his medications are causing him postural hypotension or fatigue.   Dr William Bellows  MD,MRCP [facility]) Follow up in 3 months

## 2017-12-15 NOTE — Progress Notes (Signed)
[name] had his labs completed as requested prior to [place].

## 2017-12-17 DIAGNOSIS — S335XXA Sprain of ligaments of lumbar spine, initial encounter: Secondary | ICD-10-CM | POA: Diagnosis not present

## 2017-12-17 DIAGNOSIS — M9903 Segmental and somatic dysfunction of lumbar region: Secondary | ICD-10-CM | POA: Diagnosis not present

## 2017-12-23 DIAGNOSIS — H401112 Primary open-angle glaucoma, right eye, moderate stage: Secondary | ICD-10-CM | POA: Diagnosis not present

## 2017-12-26 ENCOUNTER — Ambulatory Visit: Payer: Medicare HMO | Admitting: Family Medicine

## 2017-12-26 DIAGNOSIS — Z0289 Encounter for other administrative examinations: Secondary | ICD-10-CM

## 2017-12-27 ENCOUNTER — Encounter: Payer: Self-pay | Admitting: Family Medicine

## 2017-12-30 DIAGNOSIS — M9903 Segmental and somatic dysfunction of lumbar region: Secondary | ICD-10-CM | POA: Diagnosis not present

## 2017-12-30 DIAGNOSIS — S335XXA Sprain of ligaments of lumbar spine, initial encounter: Secondary | ICD-10-CM | POA: Diagnosis not present

## 2017-12-31 ENCOUNTER — Encounter: Payer: Self-pay | Admitting: Family Medicine

## 2018-01-01 ENCOUNTER — Telehealth: Payer: Self-pay

## 2018-01-01 DIAGNOSIS — H401112 Primary open-angle glaucoma, right eye, moderate stage: Secondary | ICD-10-CM | POA: Diagnosis not present

## 2018-01-01 NOTE — Telephone Encounter (Signed)
Patient has been scheduled to see Dr. Caryl Bis on 02-16-18 and has been notified of appt.

## 2018-01-07 ENCOUNTER — Other Ambulatory Visit: Payer: Self-pay | Admitting: Family Medicine

## 2018-01-13 ENCOUNTER — Ambulatory Visit: Payer: Medicare Other | Admitting: Family Medicine

## 2018-01-27 DIAGNOSIS — M9903 Segmental and somatic dysfunction of lumbar region: Secondary | ICD-10-CM | POA: Diagnosis not present

## 2018-01-27 DIAGNOSIS — M5135 Other intervertebral disc degeneration, thoracolumbar region: Secondary | ICD-10-CM | POA: Diagnosis not present

## 2018-02-05 ENCOUNTER — Encounter: Payer: Self-pay | Admitting: Urology

## 2018-02-05 ENCOUNTER — Ambulatory Visit: Payer: Medicare HMO | Admitting: Urology

## 2018-02-05 VITALS — BP 118/72 | HR 64 | Resp 16 | Ht 76.0 in | Wt 223.2 lb

## 2018-02-05 DIAGNOSIS — X32XXXA Exposure to sunlight, initial encounter: Secondary | ICD-10-CM | POA: Diagnosis not present

## 2018-02-05 DIAGNOSIS — D485 Neoplasm of uncertain behavior of skin: Secondary | ICD-10-CM | POA: Diagnosis not present

## 2018-02-05 DIAGNOSIS — C44229 Squamous cell carcinoma of skin of left ear and external auricular canal: Secondary | ICD-10-CM | POA: Diagnosis not present

## 2018-02-05 DIAGNOSIS — Z08 Encounter for follow-up examination after completed treatment for malignant neoplasm: Secondary | ICD-10-CM | POA: Diagnosis not present

## 2018-02-05 DIAGNOSIS — L570 Actinic keratosis: Secondary | ICD-10-CM | POA: Diagnosis not present

## 2018-02-05 DIAGNOSIS — C61 Malignant neoplasm of prostate: Secondary | ICD-10-CM

## 2018-02-05 DIAGNOSIS — Z85828 Personal history of other malignant neoplasm of skin: Secondary | ICD-10-CM | POA: Diagnosis not present

## 2018-02-05 MED ORDER — FINASTERIDE 5 MG PO TABS: 5.0000 mg | ORAL_TABLET | Freq: Every day | ORAL | 2 refills | Status: DC

## 2018-02-05 NOTE — Progress Notes (Signed)
02/05/2018 9:07 AM   William Taylor May 30, 1930 [id number]  Referring provider: [name], MD [address] [place], [place] [address]  Chief Complaint  Patient presents with  . Prostate Cancer    HPI: William Taylor is an 84 year old male who presents for transfer of urologic care.  He has previously been followed by Dr. Jacqlyn Taylor at [facility] and last saw him in September 2018. 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  PSA was 5.7.  There was a single focus of Gleason 3+3 adenocarcinoma in the right lateral base.  Prostate volume was 77 cc.  He elected surveillance and was started on finasteride.  He had significant decrease in his PSA on finasteride.  His last PSA September 2018 was stable at 0.22.  He denies bothersome lower urinary tract symptoms.  He has had some left groin pain the last month.  He has a previous history of hernia repair on that side and states his presenting symptoms are similar.  PMH: Past Medical History:  Diagnosis Date  . Cancer [facility])    Prostate, followed by Dr. Jacqlyn Taylor  . Colon polyp   . Coronary artery disease   . Hyperlipidemia   . Hypertension   . MI (myocardial infarction) ([place])    2015  . PAF (paroxysmal atrial fibrillation) ([facility])    a. on xarelto  . Skin cancer     Surgical History: Past Surgical History:  Procedure Laterality Date  . CARDIAC CATHETERIZATION  10/2013   [facility]  . CATARACT EXTRACTION  Oct. 3, 2012   right eye  . colonoscopy    . COLONOSCOPY W/ POLYPECTOMY  2015   Dr William Taylor  . COLONOSCOPY WITH PROPOFOL N/A 08/15/2016   Procedure: COLONOSCOPY WITH PROPOFOL;  Surgeon: William Bellows, MD;  Location: [facility] ENDOSCOPY;  Service: Endoscopy;  Laterality: N/A;  . CORONARY ANGIOPLASTY  2009   2005; s/p stent  . ESOPHAGOGASTRODUODENOSCOPY (EGD) WITH PROPOFOL N/A 08/15/2016   Procedure: ESOPHAGOGASTRODUODENOSCOPY (EGD) WITH PROPOFOL;  Surgeon: William Bellows, MD;  Location: [facility] ENDOSCOPY;  Service:  Endoscopy;  Laterality: N/A;  . GIVENS CAPSULE STUDY N/A 09/26/2016   Procedure: GIVENS CAPSULE STUDY;  Surgeon: William Bellows, MD;  Location: [facility] ENDOSCOPY;  Service: Endoscopy;  Laterality: N/A;  . HERNIA REPAIR  2013  . LUMBAR SPINE SURGERY    . UPPER GI ENDOSCOPY  Sept 2015   Dr William Taylor Medications:  Allergies as of 02/05/2018      Reactions   Bee Venom Swelling   Sulfasalazine Hives, Swelling   Sulfa Antibiotics Hives, Swelling   Warfarin And Related Other (See Comments)   Chest pain      Medication List        Accurate as of 02/05/18  9:07 AM. Always use your most recent med list.          amiodarone 200 MG tablet Commonly known as:  PACERONE Take 1 tablet (200 mg total) by mouth daily as needed.   chlorhexidine 0.12 % solution Commonly known as:  PERIDEX   cyproheptadine 4 MG tablet Commonly known as:  PERIACTIN Take 1 tablet (4 mg total) by mouth 3 (three) times daily as needed for allergies.   diltiazem 120 MG 24 hr capsule Commonly known as:  CARDIZEM CD Take 1 capsule (120 mg total) by mouth daily.   doxycycline 100 MG capsule Commonly known as:  VIBRAMYCIN   esomeprazole 40 MG capsule Commonly known as:  [place] 1  CAPSULE BY MOUTH  DAILY AT 12 NOON   fenofibrate micronized 130 MG capsule Commonly known as:  ANTARA TAKE 1 CAPSULE BY MOUTH  DAILY BEFORE BREAKFAST   ferrous sulfate 325 (65 FE) MG tablet Take 1 tablet (325 mg total) by mouth 2 (two) times daily with a meal.   finasteride 5 MG tablet Commonly known as:  PROSCAR Take 5 mg by mouth daily.   Fish Oil 1000 MG Caps Take 1,000 mg by mouth daily.   levobunolol 0.5 % ophthalmic solution Commonly known as:  BETAGAN Place 1 drop into the right eye 2 (two) times daily.   lisinopril 5 MG tablet Commonly known as:  PRINIVIL,ZESTRIL TAKE 1 TABLET BY MOUTH  DAILY   metoprolol tartrate 25 MG tablet Commonly known as:  LOPRESSOR Take 1 tablet (25 mg total) by mouth 2 (two) times  daily.   nitroGLYCERIN 0.4 MG SL tablet Commonly known as:  NITROSTAT DISSOLVE UNDER THE TONGUE 1 TABLET EVERY 5 MINUTES AS NEEDED FOR CHEST PAIN   pravastatin 40 MG tablet Commonly known as:  PRAVACHOL Take 1 tablet (40 mg total) by mouth at bedtime.   tamsulosin 0.4 MG Caps capsule Commonly known as:  FLOMAX Take 0.4 mg by mouth daily.   XARELTO 20 MG Tabs tablet Generic drug:  rivaroxaban TAKE 1 TABLET DAILY       Allergies:  Allergies  Allergen Reactions  . Bee Venom Swelling  . Sulfasalazine Hives and Swelling  . Sulfa Antibiotics Hives and Swelling  . Warfarin And Related Other (See Comments)    Chest pain    Family History: Family History  Problem Relation Age of Onset  . Stroke Mother   . Colon cancer Father     Social History:  reports that he has never smoked. He has never used smokeless tobacco. He reports that he does not drink alcohol or use drugs.  ROS: UROLOGY Frequent Urination?: Yes Hard to postpone urination?: No Burning/pain with urination?: No Get up at night to urinate?: Yes Leakage of urine?: Yes Urine stream starts and stops?: No Trouble starting stream?: No Do you have to strain to urinate?: No Blood in urine?: No Urinary tract infection?: No Sexually transmitted disease?: No Injury to kidneys or bladder?: No Painful intercourse?: No Weak stream?: No Erection problems?: Yes Penile pain?: No  Gastrointestinal Nausea?: No Vomiting?: No Indigestion/heartburn?: No Diarrhea?: No Constipation?: Yes  Constitutional Fever: No Night sweats?: No Weight loss?: No Fatigue?: Yes  Skin Skin rash/lesions?: No Itching?: No  Eyes Blurred vision?: No Double vision?: No  Ears/Nose/Throat Sore throat?: Yes Sinus problems?: Yes  Hematologic/Lymphatic Swollen glands?: No Easy bruising?: Yes  Cardiovascular Leg swelling?: Yes Chest pain?: Yes  Respiratory Cough?: No Shortness of breath?: Yes  Endocrine Excessive  thirst?: No  Musculoskeletal Back pain?: Yes Joint pain?: No  Neurological Headaches?: No Dizziness?: Yes  Psychologic Depression?: No Anxiety?: No  Physical Exam: BP 118/72   Pulse 64   Resp 16   Ht 6\' 4"  (1.93 m)   Wt 223 lb 3.2 oz (101.2 kg)   SpO2 96%   BMI 27.17 kg/m   Constitutional:  Alert and oriented, No acute distress. HEENT: [place] AT, moist mucus membranes.  Trachea midline, no masses. Cardiovascular: No clubbing, cyanosis, or edema. Respiratory: Normal respiratory effort, no increased work of breathing. GI: Abdomen is soft, nontender, nondistended, no abdominal masses GU: No CVA tenderness.  Prostate 30 g, smooth without nodules.  No evidence of inguinal hernia. Lymph: No cervical or  inguinal lymphadenopathy. Skin: No rashes, bruises or suspicious lesions. Neurologic: Grossly intact, no focal deficits, moving all 4 extremities. Psychiatric: Normal mood and affect.    Assessment & Plan:   84 year old male with low risk adenocarcinoma the prostate.  PSA was drawn today and if stable he will follow-up in 6 months.  Finasteride was refilled.   Return in about 6 months (around 08/07/2018) for Recheck, PSA.  Abbie Sons, [place] [address] [place],  [address] [phone]

## 2018-02-06 ENCOUNTER — Telehealth: Payer: Self-pay

## 2018-02-06 LAB — PSA: Prostate Specific Ag, Serum: 0.1 ng/mL (ref 0.0–4.0)

## 2018-02-06 NOTE — Telephone Encounter (Signed)
Called pt informed him of normal results. Pt gave verbal understanding.

## 2018-02-06 NOTE — Telephone Encounter (Signed)
-----   Message from Abbie Sons, MD sent at 02/06/2018  8:55 AM EDT ----- PSA stable at 0.1

## 2018-02-16 ENCOUNTER — Encounter: Payer: Self-pay | Admitting: Family Medicine

## 2018-02-16 ENCOUNTER — Ambulatory Visit (INDEPENDENT_AMBULATORY_CARE_PROVIDER_SITE_OTHER): Payer: Medicare HMO | Admitting: Family Medicine

## 2018-02-16 ENCOUNTER — Ambulatory Visit (INDEPENDENT_AMBULATORY_CARE_PROVIDER_SITE_OTHER): Payer: Medicare HMO

## 2018-02-16 VITALS — BP 138/80 | HR 59 | Temp 97.7°F | Ht 75.5 in | Wt 221.6 lb

## 2018-02-16 DIAGNOSIS — R5383 Other fatigue: Secondary | ICD-10-CM | POA: Diagnosis not present

## 2018-02-16 DIAGNOSIS — R079 Chest pain, unspecified: Secondary | ICD-10-CM | POA: Diagnosis not present

## 2018-02-16 DIAGNOSIS — M549 Dorsalgia, unspecified: Secondary | ICD-10-CM

## 2018-02-16 DIAGNOSIS — R0609 Other forms of dyspnea: Secondary | ICD-10-CM

## 2018-02-16 DIAGNOSIS — G8929 Other chronic pain: Secondary | ICD-10-CM | POA: Diagnosis not present

## 2018-02-16 DIAGNOSIS — R1032 Left lower quadrant pain: Secondary | ICD-10-CM | POA: Diagnosis not present

## 2018-02-16 DIAGNOSIS — D509 Iron deficiency anemia, unspecified: Secondary | ICD-10-CM | POA: Diagnosis not present

## 2018-02-16 DIAGNOSIS — R0600 Dyspnea, unspecified: Secondary | ICD-10-CM

## 2018-02-16 DIAGNOSIS — C61 Malignant neoplasm of prostate: Secondary | ICD-10-CM | POA: Diagnosis not present

## 2018-02-16 NOTE — Patient Instructions (Signed)
Nice to see you. We are going to obtain an x-ray and labs today and we will contact you with the results. If you develop chest pain or shortness of breath that does not resolve quickly, or you have develop abdominal pain, significant groin pain, or any new or changing symptoms please seek medical attention immediately.

## 2018-02-17 DIAGNOSIS — M549 Dorsalgia, unspecified: Secondary | ICD-10-CM

## 2018-02-17 DIAGNOSIS — G8929 Other chronic pain: Secondary | ICD-10-CM | POA: Insufficient documentation

## 2018-02-17 LAB — COMPREHENSIVE METABOLIC PANEL
ALK PHOS: 24 U/L — AB (ref 39–117)
ALT: 17 U/L (ref 0–53)
AST: 19 U/L (ref 0–37)
Albumin: 4.1 g/dL (ref 3.5–5.2)
BILIRUBIN TOTAL: 0.8 mg/dL (ref 0.2–1.2)
BUN: 30 mg/dL — AB (ref 6–23)
CO2: 27 mEq/L (ref 19–32)
CREATININE: 1.36 mg/dL (ref 0.40–1.50)
Calcium: 9.4 mg/dL (ref 8.4–10.5)
Chloride: 105 mEq/L (ref 96–112)
GFR: 52.78 mL/min — ABNORMAL LOW (ref >60.00)
GLUCOSE: 117 mg/dL — AB (ref 70–99)
Potassium: 4.4 mEq/L (ref 3.5–5.1)
SODIUM: 140 meq/L (ref 135–145)
TOTAL PROTEIN: 6.3 g/dL (ref 6.0–8.3)

## 2018-02-17 LAB — CBC
HCT: 30.8 % — ABNORMAL LOW (ref 39.0–52.0)
HEMOGLOBIN: 10.7 g/dL — AB (ref 13.0–17.0)
MCHC: 34.8 g/dL (ref 30.0–36.0)
MCV: 99.9 fl (ref 78.0–100.0)
Platelets: 210 10*3/uL (ref 150.0–400.0)
RBC: 3.09 Mil/uL — ABNORMAL LOW (ref 4.22–5.81)
RDW: 13.8 % (ref 11.5–15.5)
WBC: 6.1 10*3/uL (ref 4.0–10.5)

## 2018-02-17 LAB — TSH: TSH: 4.25 u[IU]/mL (ref 0.35–4.50)

## 2018-02-17 MED ORDER — CYPROHEPTADINE HCL 4 MG PO TABS: 4.0000 mg | ORAL_TABLET | Freq: Three times a day (TID) | ORAL | 3 refills | Status: DC | PRN

## 2018-02-17 NOTE — Progress Notes (Signed)
Tommi Rumps, MD Phone: [phone]  William Taylor is a 84 y.o. male who presents today for f/u.  CC: Patient presented for a physical exam though he had that I advised he should be rescheduled for physical and we will deal with his chronic issues first.  Left groin pain: Patient reports this has been going on for some time now.  He will go periods of time where he does not have any issues and then sometimes he will have intermittent sharp pain.  He notes it feels similar to when he had his hernia previously.  That was repaired around a couple of years ago.  He notes no bulging.  No abdominal pain.  No nausea or vomiting.  He does have some constipation where he will have a bowel movement every 4 to 5 days.  Last was 2 to 3 days ago.  He does pass gas daily.  He is on iron.  Urinary frequency: He has chronic urinary frequency and slight leakage after urinating.  He saw his urologist last week for this.  No dysuria.  Fatigue: The patient reports for at least the last year he has felt overall weak and fatigued particularly when he exerts himself.  Some occasional shortness of breath.  Occasional left-sided chest pain that resolves with taking the nitroglycerin.  This chest pain is been chronic and intermittent for some time.  He has not had any chest pain in the last 6 weeks.  Notes his fatigue improves with rest.  He notes no coughing.  Possible wheezing.  He saw his cardiologist previously for this about 5 or 6 months ago and had a chemical stress test that was low risk.  Chronic back pain: Patient reports chronic issues with his neck and back.  He notes particularly having stiffness if he stands in church while singing or if he is exerting himself.  He has had back surgeries x2.  He notes no numbness or weakness.  No incontinence.  His chiropractor keeps him in good shape.   Social History   Tobacco Use  Smoking Status Never Smoker  Smokeless Tobacco Never Used     ROS   General:   Negative for nexplained weight loss, fever Skin: Negative for new or changing mole, sore that won't heal HEENT: Positive for trouble hearing, negative for trouble seeing, ringing in ears, mouth sores, hoarseness, change in voice, dysphagia. CV:  Negative for chest pain, dyspnea, edema, palpitations Resp: Negative for cough, dyspnea, hemoptysis GI: Positive for constipation, negative for nausea, vomiting, diarrhea, abdominal pain, melena, hematochezia. GU: Positive for incontinence, urinary frequency, negative for dysuria, urinary hesitance, hematuria, vaginal or penile discharge, sexual difficulty, lumps in testicle or breasts MSK: Negative for muscle cramps or aches, joint pain or swelling Neuro: Negative for headaches, weakness, numbness, dizziness, passing out/fainting Psych: Negative for depression, anxiety, memory problems  Objective  Physical Exam Vitals:   02/16/18 1427  BP: 138/80  Pulse: (!) 59  Temp: 97.7 F (36.5 C)  SpO2: 98%    BP Readings from Last 3 Encounters:  02/16/18 138/80  02/05/18 118/72  12/15/17 129/78   Wt Readings from Last 3 Encounters:  02/16/18 221 lb 9.6 oz (100.5 kg)  02/05/18 223 lb 3.2 oz (101.2 kg)  12/15/17 220 lb (99.8 kg)    Physical Exam  Constitutional: No distress.  Cardiovascular: Normal rate, regular rhythm and normal heart sounds.  Pulmonary/Chest: Effort normal and breath sounds normal.  Abdominal: Soft. Bowel sounds are normal. He exhibits no distension.  There is no tenderness.  Genitourinary:  Genitourinary Comments: Normal penis, normal Stickles, normal epididymis, normal vas deferens, no inguinal hernias palpated, no bulges noted, no areas of tenderness in either groin  Musculoskeletal: He exhibits no edema.  Midline spine tenderness, no midline spine step-off, no muscular back tenderness  Neurological: He is alert.  5/5 strength in bilateral biceps, triceps, grip, quads, hamstrings, plantar and dorsiflexion, sensation to  light touch intact in bilateral UE and LE, normal gait  Skin: Skin is warm and dry. He is not diaphoretic.     Assessment/Plan: Please see individual problem list.  Fatigue Patient with chronic fatigue possibly related to deconditioning.  Recent stress test was low risk making ischemic cause less likely.  He potentially has had some wheezing and thus it could be a pulmonary cause.  He has been anemic in the past and it could be related to that as well.  We will start with lab work and a chest x-ray and then determine the next step in evaluation or management.  DOE (dyspnea on exertion) Chronic issues with this.  Potentially could be lung related with his report of wheezing.  Unlikely ischemic given recent reassuring stress test.  Could be related to anemia.  We will check lab work and a chest x-ray and then determine the next step in evaluation.  Prostate cancer He will continue to follow with urology for his urinary symptoms and history of prostate cancer.  Iron deficiency anemia Could be contributing to his fatigue and dyspnea.  We will check a CBC.  Chronic back pain Chronic issue.  Benign exam.  He will continue to see his chiropractor.  Groin pain, chronic, left Chronic intermittent issue.  No abnormalities on exam.  Discussed potential for imaging so we opted to hold off on this until his other labs and evaluation returns.  Chest pain He has had chronic intermittent issues with this.  Seems to be stable angina.  Responds to nitroglycerin.  Recent stress test reassuring.  He will continue to see cardiology.   Orders Placed This Encounter  Procedures  . DG Chest 2 View    Standing Status:   Future    Number of Occurrences:   1    Standing Expiration Date:   04/20/2019    Order Specific Question:   Reason for Exam (SYMPTOM  OR DIAGNOSIS REQUIRED)    Answer:   chronic dyspnea on exertion    Order Specific Question:   Preferred imaging location?    Answer:   [facility] Specific Question:   Radiology Contrast Protocol - do NOT remove file path    Answer:   \\[email or web address]  . CBC  . Comp Met (CMET)  . TSH    No orders of the defined types were placed in this encounter.    Tommi Rumps, MD [place]

## 2018-02-17 NOTE — Assessment & Plan Note (Signed)
He has had chronic intermittent issues with this.  Seems to be stable angina.  Responds to nitroglycerin.  Recent stress test reassuring.  He will continue to see cardiology.

## 2018-02-17 NOTE — Assessment & Plan Note (Signed)
He will continue to follow with urology for his urinary symptoms and history of prostate cancer.

## 2018-02-17 NOTE — Assessment & Plan Note (Signed)
Could be contributing to his fatigue and dyspnea.  We will check a CBC.

## 2018-02-17 NOTE — Assessment & Plan Note (Signed)
Chronic issues with this.  Potentially could be lung related with his report of wheezing.  Unlikely ischemic given recent reassuring stress test.  Could be related to anemia.  We will check lab work and a chest x-ray and then determine the next step in evaluation.

## 2018-02-17 NOTE — Addendum Note (Signed)
Addended by: [name] on: 02/17/2018 09:11 AM   Modules accepted: Orders

## 2018-02-17 NOTE — Assessment & Plan Note (Signed)
Patient with chronic fatigue possibly related to deconditioning.  Recent stress test was low risk making ischemic cause less likely.  He potentially has had some wheezing and thus it could be a pulmonary cause.  He has been anemic in the past and it could be related to that as well.  We will start with lab work and a chest x-ray and then determine the next step in evaluation or management.

## 2018-02-17 NOTE — Assessment & Plan Note (Signed)
Chronic intermittent issue.  No abnormalities on exam.  Discussed potential for imaging so we opted to hold off on this until his other labs and evaluation returns.

## 2018-02-17 NOTE — Assessment & Plan Note (Signed)
Chronic issue.  Benign exam.  He will continue to see his chiropractor.

## 2018-02-21 ENCOUNTER — Other Ambulatory Visit: Payer: Self-pay | Admitting: Family Medicine

## 2018-02-21 DIAGNOSIS — D649 Anemia, unspecified: Secondary | ICD-10-CM

## 2018-02-21 DIAGNOSIS — R1032 Left lower quadrant pain: Secondary | ICD-10-CM

## 2018-02-24 DIAGNOSIS — M5135 Other intervertebral disc degeneration, thoracolumbar region: Secondary | ICD-10-CM | POA: Diagnosis not present

## 2018-02-24 DIAGNOSIS — M5136 Other intervertebral disc degeneration, lumbar region: Secondary | ICD-10-CM | POA: Diagnosis not present

## 2018-02-24 DIAGNOSIS — M9903 Segmental and somatic dysfunction of lumbar region: Secondary | ICD-10-CM | POA: Diagnosis not present

## 2018-02-26 ENCOUNTER — Ambulatory Visit
Admission: RE | Admit: 2018-02-26 | Discharge: 2018-02-26 | Disposition: A | Discharge status: Home / Self Care | Payer: Medicare HMO | Source: Ambulatory Visit | Attending: Family Medicine | Admitting: Family Medicine

## 2018-02-26 DIAGNOSIS — R1030 Lower abdominal pain, unspecified: Secondary | ICD-10-CM | POA: Diagnosis not present

## 2018-02-26 DIAGNOSIS — R1032 Left lower quadrant pain: Secondary | ICD-10-CM | POA: Diagnosis not present

## 2018-03-05 ENCOUNTER — Encounter: Payer: Self-pay | Admitting: Oncology

## 2018-03-05 ENCOUNTER — Inpatient Hospital Stay: Payer: Medicare HMO | Attending: Oncology | Admitting: Oncology

## 2018-03-05 ENCOUNTER — Other Ambulatory Visit: Payer: Self-pay

## 2018-03-05 ENCOUNTER — Inpatient Hospital Stay: Payer: Medicare HMO

## 2018-03-05 VITALS — BP 112/67 | HR 64 | Temp 96.5°F | Resp 18 | Ht 75.98 in | Wt 221.0 lb

## 2018-03-05 DIAGNOSIS — G8929 Other chronic pain: Secondary | ICD-10-CM | POA: Insufficient documentation

## 2018-03-05 DIAGNOSIS — Z7901 Long term (current) use of anticoagulants: Secondary | ICD-10-CM | POA: Diagnosis not present

## 2018-03-05 DIAGNOSIS — E785 Hyperlipidemia, unspecified: Secondary | ICD-10-CM | POA: Insufficient documentation

## 2018-03-05 DIAGNOSIS — M549 Dorsalgia, unspecified: Secondary | ICD-10-CM | POA: Insufficient documentation

## 2018-03-05 DIAGNOSIS — I129 Hypertensive chronic kidney disease with stage 1 through stage 4 chronic kidney disease, or unspecified chronic kidney disease: Secondary | ICD-10-CM | POA: Insufficient documentation

## 2018-03-05 DIAGNOSIS — I480 Paroxysmal atrial fibrillation: Secondary | ICD-10-CM | POA: Insufficient documentation

## 2018-03-05 DIAGNOSIS — D5912 Cold autoimmune hemolytic anemia: Secondary | ICD-10-CM

## 2018-03-05 DIAGNOSIS — Z85828 Personal history of other malignant neoplasm of skin: Secondary | ICD-10-CM | POA: Diagnosis not present

## 2018-03-05 DIAGNOSIS — N183 Chronic kidney disease, stage 3 (moderate): Secondary | ICD-10-CM | POA: Diagnosis not present

## 2018-03-05 DIAGNOSIS — D591 Other autoimmune hemolytic anemias: Secondary | ICD-10-CM | POA: Insufficient documentation

## 2018-03-05 LAB — COMPREHENSIVE METABOLIC PANEL
ALBUMIN: 4 g/dL (ref 3.5–5.0)
ALT: 21 U/L (ref 0–44)
AST: 31 U/L (ref 15–41)
Alkaline Phosphatase: 27 U/L — ABNORMAL LOW (ref 38–126)
Anion gap: 7 (ref 5–15)
BUN: 28 mg/dL — AB (ref 8–23)
CO2: 22 mmol/L (ref 22–32)
Calcium: 9.2 mg/dL (ref 8.9–10.3)
Chloride: 109 mmol/L (ref 98–111)
Creatinine, Ser: 1.3 mg/dL — ABNORMAL HIGH (ref 0.61–1.24)
GFR calc Af Amer: 56 mL/min — ABNORMAL LOW (ref >60)
GFR calc non Af Amer: 48 mL/min — ABNORMAL LOW (ref >60)
Glucose, Bld: 132 mg/dL — ABNORMAL HIGH (ref 70–99)
POTASSIUM: 4.1 mmol/L (ref 3.5–5.1)
SODIUM: 138 mmol/L (ref 135–145)
Total Bilirubin: 1.1 mg/dL (ref 0.3–1.2)
Total Protein: 6.7 g/dL (ref 6.5–8.1)

## 2018-03-05 LAB — PATHOLOGIST SMEAR REVIEW

## 2018-03-05 LAB — DAT, POLYSPECIFIC AHG (ARMC ONLY)
DAT, COMPLEMENT: POSITIVE
DAT, IgG: NEGATIVE
Polyspecific AHG test: POSITIVE

## 2018-03-05 LAB — CBC WITH DIFFERENTIAL/PLATELET
Basophils Absolute: 0 10*3/uL (ref 0–0.1)
Basophils Relative: 1 %
Eosinophils Absolute: 0 10*3/uL (ref 0–0.7)
Eosinophils Relative: 1 %
HCT: 35 % — ABNORMAL LOW (ref 40.0–52.0)
Hemoglobin: 11.9 g/dL — ABNORMAL LOW (ref 13.0–18.0)
Lymphocytes Relative: 23 %
Lymphs Abs: 1.5 10*3/uL (ref 1.0–3.6)
MCH: 34.6 pg — ABNORMAL HIGH (ref 26.0–34.0)
MCHC: 33.9 g/dL (ref 32.0–36.0)
MCV: 101.9 fL — ABNORMAL HIGH (ref 80.0–100.0)
Monocytes Absolute: 0.5 10*3/uL (ref 0.2–1.0)
Monocytes Relative: 8 %
Neutro Abs: 4.4 10*3/uL (ref 1.4–6.5)
Neutrophils Relative %: 67 %
Platelets: 228 10*3/uL (ref 150–440)
RBC: 3.43 MIL/uL — ABNORMAL LOW (ref 4.40–5.90)
RDW: 13.1 % (ref 11.5–14.5)
WBC: 6.5 10*3/uL (ref 3.8–10.6)

## 2018-03-05 LAB — FERRITIN: Ferritin: 197 ng/mL (ref 24–336)

## 2018-03-05 LAB — IRON AND TIBC
Iron: 119 ug/dL (ref 45–182)
Saturation Ratios: 29 % (ref 17.9–39.5)
TIBC: 409 ug/dL (ref 250–450)
UIBC: 290 ug/dL

## 2018-03-05 LAB — RETICULOCYTES
RBC.: 0.86 MIL/uL — ABNORMAL LOW (ref 4.40–5.90)
RETIC COUNT ABSOLUTE: 16.3 10*3/uL — AB (ref 19.0–183.0)
Retic Ct Pct: 1.9 % (ref 0.4–3.1)

## 2018-03-05 LAB — LACTATE DEHYDROGENASE: LDH: 122 U/L (ref 98–192)

## 2018-03-05 NOTE — Progress Notes (Signed)
Here for new pt evaluation. intermittent sob he stated Pulse oximetry on room air is p. Ox 97 % on RA.  Per pt if he "walks around a grocery store " he gets sob.

## 2018-03-05 NOTE — Progress Notes (Signed)
Hematology/Oncology Consult note [facility] Telephone:([phone] [phone]  Patient Care Team: [name], MD as PCP - General (Family Medicine) Bary Castilla Forest Gleason, MD as Consulting Physician (General Surgery) [name], MD as Referring Physician (Internal Medicine) [name], MD as Consulting Physician (Cardiology)   Name of the patient: William Taylor  [id number]  1930/04/23    Reason for referral- anemia   Referring physician- Dr. Caryl Bis  Date of visit: 03/05/18   History of presenting illness-patient is a 84 year old male with a past medical history significant for prostate cancer for which he follows up with urology.  He also has chronic back pain, hypertension and stage III CKD hyperlipidemia and A. fib among other medical problems.  He has been referred to [place] for evaluation and management of anemia.  Recent CBC from 02/16/2018 showed white count of 6.1, H&H of 10.7/30.8 with an MCV of 99.9.  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  TSH was within normal limits.B12 and folate were within normal limits.  His hemoglobin was 10.7 about 4 months ago but about 1 year ago his hemoglobin was 12.9.  Patient reports ongoing fatigue over the last 1 year.  His appetite is good and his weight has been stable.  He denies any lumps or bumps anywhere or drenching night sweats.  Patient does report ongoing left-sided groin pain.  He did have an ultrasound which did not reveal any adenopathy or mass.  Also reports chronic back pain but denies any other acute joint pain or joint swelling.  No skin rash.  ECOG PS- 1  Pain scale- 5   Review of systems- Review of Systems  Constitutional: Positive for malaise/fatigue. Negative for chills, fever and weight loss.  HENT: Negative for congestion, ear discharge and nosebleeds.   Eyes: Negative for blurred vision.  Respiratory: Negative for cough, hemoptysis, sputum  production, shortness of breath and wheezing.   Cardiovascular: Negative for chest pain, palpitations, orthopnea and claudication.  Gastrointestinal: Negative for abdominal pain, blood in stool, constipation, diarrhea, heartburn, melena, nausea and vomiting.  Genitourinary: Negative for dysuria, flank pain, frequency, hematuria and urgency.  Musculoskeletal: Positive for back pain. Negative for joint pain and myalgias.  Skin: Negative for rash.  Neurological: Negative for dizziness, tingling, focal weakness, seizures, weakness and headaches.  Endo/Heme/Allergies: Does not bruise/bleed easily.  Psychiatric/Behavioral: Negative for depression and suicidal ideas. The patient does not have insomnia.     Allergies  Allergen Reactions  . Bee Venom Swelling  . Sulfasalazine Hives and Swelling  . Sulfa Antibiotics Hives and Swelling  . Warfarin And Related Other (See Comments)    Chest pain    Patient Active Problem List   Diagnosis Date Noted  . Chronic back pain 02/17/2018  . Chest pain 03/07/2017  . Angiodysplasia of intestinal tract   . Iron deficiency anemia   . Benign neoplasm of ascending colon   . Diverticulosis of large intestine without diverticulitis   . Gastric polyp   . Left rotator cuff tear arthropathy 09/11/2015  . Fatigue 04/15/2014  . Groin pain, chronic, left 01/18/2014  . Prostate cancer ([place]) 03/15/2013  . DOE (dyspnea on exertion) 11/23/2012  . Insomnia 09/03/2012  . Hypertension 07/23/2012  . Chronic kidney disease (CKD), stage III (moderate) ([place]) 07/23/2011  . Pulmonary embolism ([place]) 01/09/2011  . Paroxysmal A-fib ([place]) 09/06/2010  . Hyperlipidemia 05/19/2009  . Coronary artery disease of native artery of native heart with stable angina pectoris (  [place]) 05/19/2009     Past Medical History:  Diagnosis Date  . Cancer [facility])    Prostate, followed by Dr. Jacqlyn Larsen  . Colon polyp   . Coronary artery disease   . Hyperlipidemia   . Hypertension   . MI  (myocardial infarction) ([place])    2015  . PAF (paroxysmal atrial fibrillation) ([place])    a. on xarelto  . Skin cancer      Past Surgical History:  Procedure Laterality Date  . CARDIAC CATHETERIZATION  10/2013   [facility]  . CATARACT EXTRACTION  Oct. 3, 2012   right eye  . colonoscopy    . COLONOSCOPY W/ POLYPECTOMY  2015   Dr Rayann Heman  . COLONOSCOPY WITH PROPOFOL N/A 08/15/2016   Procedure: COLONOSCOPY WITH PROPOFOL;  Surgeon: [name], MD;  Location: [facility] ENDOSCOPY;  Service: Endoscopy;  Laterality: N/A;  . CORONARY ANGIOPLASTY  2009   2005; s/p stent  . ESOPHAGOGASTRODUODENOSCOPY (EGD) WITH PROPOFOL N/A 08/15/2016   Procedure: ESOPHAGOGASTRODUODENOSCOPY (EGD) WITH PROPOFOL;  Surgeon: [name], MD;  Location: [facility] ENDOSCOPY;  Service: Endoscopy;  Laterality: N/A;  . GIVENS CAPSULE STUDY N/A 09/26/2016   Procedure: GIVENS CAPSULE STUDY;  Surgeon: [name], MD;  Location: [facility] ENDOSCOPY;  Service: Endoscopy;  Laterality: N/A;  . HERNIA REPAIR  2013  . LUMBAR SPINE SURGERY    . UPPER GI ENDOSCOPY  Sept 2015   Dr Rayann Heman    Social History   Socioeconomic History  . Marital status: Married    Spouse name: Not on file  . Number of children: Not on file  . Years of education: Not on file  . Highest education level: Not on file  Occupational History  . Not on file  Social Needs  . Financial resource strain: Not on file  . Food insecurity:    Worry: Not on file    Inability: Not on file  . Transportation needs:    Medical: Not on file    Non-medical: Not on file  Tobacco Use  . Smoking status: Never Smoker  . Smokeless tobacco: Never Used  Substance and Sexual Activity  . Alcohol use: No  . Drug use: No  . Sexual activity: Not Currently  Lifestyle  . Physical activity:    Days per week: Not on file    Minutes per session: Not on file  . Stress: Not on file  Relationships  . Social connections:    Talks on phone: Not on file    Gets together: Not on file    Attends  religious service: Not on file    Active member of club or organization: Not on file    Attends meetings of clubs or organizations: Not on file    Relationship status: Not on file  . Intimate partner violence:    Fear of current or ex partner: Not on file    Emotionally abused: Not on file    Physically abused: Not on file    Forced sexual activity: Not on file  Other Topics Concern  . Not on file  Social History Narrative  . Not on file     Family History  Problem Relation Age of Onset  . Stroke Mother   . Colon cancer Father      Current Outpatient Medications:  .  amiodarone (PACERONE) 200 MG tablet, Take 1 tablet (200 mg total) by mouth daily as needed., Disp: 90 tablet, Rfl: 4 .  cyproheptadine (PERIACTIN) 4 MG tablet, Take 1 tablet (4 mg  total) by mouth 3 (three) times daily as needed for allergies., Disp: 90 tablet, Rfl: 3 .  diltiazem (CARDIZEM CD) 120 MG 24 hr capsule, Take 1 capsule (120 mg total) by mouth daily., Disp: 90 capsule, Rfl: 4 .  esomeprazole (NEXIUM) 40 MG capsule, TAKE 1 CAPSULE BY MOUTH  DAILY AT 12 NOON, Disp: 90 capsule, Rfl: 3 .  fenofibrate micronized (ANTARA) 130 MG capsule, TAKE 1 CAPSULE BY MOUTH  DAILY BEFORE BREAKFAST, Disp: 90 capsule, Rfl: 1 .  ferrous sulfate 325 (65 FE) MG tablet, Take 1 tablet (325 mg total) by mouth 2 (two) times daily with a meal. (Patient taking differently: Take 325 mg by mouth daily with breakfast. ), Disp: 124 tablet, Rfl: 2 .  finasteride (PROSCAR) 5 MG tablet, Take 1 tablet (5 mg total) by mouth daily., Disp: 90 tablet, Rfl: 2 .  levobunolol (BETAGAN) 0.5 % ophthalmic solution, Place 1 drop into the right eye 2 (two) times daily. , Disp: , Rfl:  .  lisinopril (PRINIVIL,ZESTRIL) 5 MG tablet, TAKE 1 TABLET BY MOUTH  DAILY, Disp: 90 tablet, Rfl: 3 .  metoprolol tartrate (LOPRESSOR) 25 MG tablet, Take 1 tablet (25 mg total) by mouth 2 (two) times daily., Disp: 180 tablet, Rfl: 3 .  nitroGLYCERIN (NITROSTAT) 0.4 MG SL tablet,  DISSOLVE UNDER THE TONGUE 1 TABLET EVERY 5 MINUTES AS NEEDED FOR CHEST PAIN, Disp: 25 tablet, Rfl: 3 .  Omega-3 Fatty Acids (FISH OIL) 1000 MG CAPS, Take 1,000 mg by mouth daily., Disp: , Rfl:  .  pravastatin (PRAVACHOL) 40 MG tablet, Take 1 tablet (40 mg total) by mouth at bedtime., Disp: 90 tablet, Rfl: 3 .  Tamsulosin HCl (FLOMAX) 0.4 MG CAPS, Take 0.4 mg by mouth daily., Disp: , Rfl:  .  XARELTO 20 MG TABS tablet, TAKE 1 TABLET DAILY, Disp: 90 tablet, Rfl: 3   Physical exam:  Vitals:   03/05/18 1335  BP: 112/67  Pulse: 64  Resp: 18  Temp: (!) 96.5 F (35.8 C)  TempSrc: Oral  Weight: 221 lb (100.2 kg)  Height: 6' 3.98" (1.93 m)   Physical Exam  Constitutional: He is oriented to person, place, and time. He appears well-developed and well-nourished.  Elderly gentleman in no acute distress  HENT:  Head: Normocephalic and atraumatic.  Eyes: Pupils are equal, round, and reactive to light. EOM are normal.  Neck: Normal range of motion.  Cardiovascular: Normal rate, regular rhythm and normal heart sounds.  Pulmonary/Chest: Effort normal and breath sounds normal.  Abdominal: Soft. Bowel sounds are normal.  No palpable hepatosplenomegaly  Lymphadenopathy:  No palpable cervical, supraclavicular, axillary or inguinal adenopathy   Neurological: He is alert and oriented to person, place, and time.  Skin: Skin is warm and dry.       CMP Latest Ref Rng & Units 02/16/2018  Glucose 70 - 99 mg/dL 117(H)  BUN 6 - 23 mg/dL 30(H)  Creatinine 0.40 - 1.50 mg/dL 1.36  Sodium 135 - 145 mEq/L 140  Potassium 3.5 - 5.1 mEq/L 4.4  Chloride 96 - 112 mEq/L 105  CO2 19 - 32 mEq/L 27  Calcium 8.4 - 10.5 mg/dL 9.4  Total Protein 6.0 - 8.3 g/dL 6.3  Total Bilirubin 0.2 - 1.2 mg/dL 0.8  Alkaline Phos 39 - 117 U/L 24(L)  AST 0 - 37 U/L 19  ALT 0 - 53 U/L 17   CBC Latest Ref Rng & Units 02/16/2018  WBC 4.0 - 10.5 K/uL 6.1  Hemoglobin 13.0 - 17.0 g/dL  10.7(L)  Hematocrit 39.0 - 52.0 % 30.8(L)    Platelets 150.0 - 400.0 K/uL 210.0    No images are attached to the encounter.  Dg Chest 2 View  Result Date: 02/17/2018 CLINICAL DATA:  Dyspnea on exertion EXAM: CHEST - 2 VIEW COMPARISON:  03/07/2017 chest radiograph. FINDINGS: Stable cardiomediastinal silhouette with top-normal heart size and mildly tortuous atherosclerotic thoracic aorta. No pneumothorax. No pleural effusion. Lungs appear clear, with no acute consolidative airspace disease and no pulmonary edema. IMPRESSION: No active cardiopulmonary disease. Electronically Signed   By: Ilona Sorrel M.D.   On: 02/17/2018 08:13   [place] Lt Lower Extrem Ltd Soft Tissue Non Vascular  Result Date: 02/26/2018 CLINICAL DATA:  84 year old male with left groin pain. Patient reports prior left side hernia repair. EXAM: ULTRASOUND LEFT LOWER EXTREMITY LIMITED TECHNIQUE: Ultrasound examination of the lower extremity soft tissues was performed in the area of clinical concern. COMPARISON:  CT Abdomen and Pelvis 09/24/2013. FINDINGS: Grayscale images of the left groin area of pain both without and with Valsalva demonstrate normal fibromuscular architecture. No fluid collection, mass or hernia is identified. IMPRESSION: No ultrasound abnormality in the area of groin pain. Electronically Signed   By: Genevie Ann M.D.   On: 02/26/2018 16:20    Assessment and plan- Patient is a 84 y.o. 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 Discussed primary cold agglutinin disease (CAD) where no cause is apparent versus secondary which can be due to infectious/ autoimmune causes and lymphoproliferative disorders.  I will proceed with CBC with differential, CMP, haptoglobin, ferritin and iron studies, reticulocyte count, LDH, Coombs test, cold agglutinin titer, myeloma panel and serum free light chains and pathology review of smear.  I will see the patient back in 2 weeks time  to discuss the results of his tests.  If tests confirmed the presence of cold agglutinin I will also get a CT chest abdomen and pelvis to rule out an underlying lymphoproliferative disorder.  Discussed avoidance of cold  To prevent further worsening of anemia. If this is primary CAD, I would favor observation over treatment given that his anemia is mild and stable over last 2 months and not close to needing a transfusion.   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 Randa Evens, MD, MPH [facility] at [facility] [phone] 03/05/2018  2:11 PM

## 2018-03-06 ENCOUNTER — Encounter: Payer: Self-pay | Admitting: Oncology

## 2018-03-06 DIAGNOSIS — M5136 Other intervertebral disc degeneration, lumbar region: Secondary | ICD-10-CM | POA: Diagnosis not present

## 2018-03-06 DIAGNOSIS — M9903 Segmental and somatic dysfunction of lumbar region: Secondary | ICD-10-CM | POA: Diagnosis not present

## 2018-03-06 DIAGNOSIS — M5135 Other intervertebral disc degeneration, thoracolumbar region: Secondary | ICD-10-CM | POA: Diagnosis not present

## 2018-03-06 LAB — KAPPA/LAMBDA LIGHT CHAINS
KAPPA FREE LGHT CHN: 23.8 mg/L — AB (ref 3.3–19.4)
KAPPA, LAMDA LIGHT CHAIN RATIO: 1.56 (ref 0.26–1.65)
Lambda free light chains: 15.3 mg/L (ref 5.7–26.3)

## 2018-03-06 LAB — MULTIPLE MYELOMA PANEL, SERUM
ALBUMIN SERPL ELPH-MCNC: 3.7 g/dL (ref 2.9–4.4)
ALPHA 1: 0.2 g/dL (ref 0.0–0.4)
ALPHA2 GLOB SERPL ELPH-MCNC: 0.5 g/dL (ref 0.4–1.0)
Albumin/Glob SerPl: 1.5 (ref 0.7–1.7)
B-GLOBULIN SERPL ELPH-MCNC: 1 g/dL (ref 0.7–1.3)
Gamma Glob SerPl Elph-Mcnc: 0.8 g/dL (ref 0.4–1.8)
Globulin, Total: 2.5 g/dL (ref 2.2–3.9)
IGG (IMMUNOGLOBIN G), SERUM: 784 mg/dL (ref 700–1600)
IGM (IMMUNOGLOBULIN M), SRM: 82 mg/dL (ref 15–143)
IgA: 293 mg/dL (ref 61–437)
TOTAL PROTEIN ELP: 6.2 g/dL (ref 6.0–8.5)

## 2018-03-06 LAB — HAPTOGLOBIN: Haptoglobin: 21 mg/dL — ABNORMAL LOW (ref 34–200)

## 2018-03-06 LAB — COLD AGGLUTININ TITER: Cold Agglutinin Titer: 1:1024 {titer}

## 2018-03-16 DIAGNOSIS — M9903 Segmental and somatic dysfunction of lumbar region: Secondary | ICD-10-CM | POA: Diagnosis not present

## 2018-03-16 DIAGNOSIS — M5442 Lumbago with sciatica, left side: Secondary | ICD-10-CM | POA: Diagnosis not present

## 2018-03-16 DIAGNOSIS — M5441 Lumbago with sciatica, right side: Secondary | ICD-10-CM | POA: Diagnosis not present

## 2018-03-18 DIAGNOSIS — M5442 Lumbago with sciatica, left side: Secondary | ICD-10-CM | POA: Diagnosis not present

## 2018-03-18 DIAGNOSIS — M9903 Segmental and somatic dysfunction of lumbar region: Secondary | ICD-10-CM | POA: Diagnosis not present

## 2018-03-18 DIAGNOSIS — M5441 Lumbago with sciatica, right side: Secondary | ICD-10-CM | POA: Diagnosis not present

## 2018-03-19 ENCOUNTER — Encounter: Payer: Self-pay | Admitting: Oncology

## 2018-03-19 ENCOUNTER — Inpatient Hospital Stay: Payer: Medicare HMO | Attending: Oncology | Admitting: Oncology

## 2018-03-19 VITALS — BP 122/71 | HR 62 | Temp 98.7°F | Resp 18 | Ht 75.98 in | Wt 221.7 lb

## 2018-03-19 DIAGNOSIS — M549 Dorsalgia, unspecified: Secondary | ICD-10-CM

## 2018-03-19 DIAGNOSIS — G8929 Other chronic pain: Secondary | ICD-10-CM

## 2018-03-19 DIAGNOSIS — N183 Chronic kidney disease, stage 3 (moderate): Secondary | ICD-10-CM | POA: Diagnosis not present

## 2018-03-19 DIAGNOSIS — Z8546 Personal history of malignant neoplasm of prostate: Secondary | ICD-10-CM

## 2018-03-19 DIAGNOSIS — R5383 Other fatigue: Secondary | ICD-10-CM | POA: Insufficient documentation

## 2018-03-19 DIAGNOSIS — Z85828 Personal history of other malignant neoplasm of skin: Secondary | ICD-10-CM | POA: Diagnosis not present

## 2018-03-19 DIAGNOSIS — D5912 Cold autoimmune hemolytic anemia: Secondary | ICD-10-CM

## 2018-03-19 DIAGNOSIS — D591 Other autoimmune hemolytic anemias: Secondary | ICD-10-CM

## 2018-03-19 DIAGNOSIS — I129 Hypertensive chronic kidney disease with stage 1 through stage 4 chronic kidney disease, or unspecified chronic kidney disease: Secondary | ICD-10-CM | POA: Diagnosis not present

## 2018-03-19 NOTE — Progress Notes (Signed)
No new changes noted today 

## 2018-03-20 NOTE — Progress Notes (Signed)
Hematology/Oncology Consult note [facility]  Telephone:([phone] [phone]  Patient Care Team: [name], MD as PCP - General (Family Medicine) Bary Castilla, Forest Gleason, MD as Consulting Physician (General Surgery) [name], MD as Referring Physician (Internal Medicine) [name], MD as Consulting Physician (Cardiology)   Name of the patient: William Taylor  [id number]  02-03-1930   Date of visit: 03/20/18  Diagnosis- cold agglutinin disease  Chief complaint/ Reason for visit- discuss results of blood work  Heme/Onc history: patient is a 84 year old male with a past medical history significant for prostate cancer for which he follows up with urology.  He also has chronic back pain, hypertension and stage III CKD hyperlipidemia and A. fib among other medical problems.  He has been referred to [place] for evaluation and management of anemia.  Recent CBC from 02/16/2018 showed white count of 6.1, H&H of 10.7/30.8 with an MCV of 99.9.  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  TSH was within normal limits.B12 and folate were within normal limits.  His hemoglobin was 10.7 about 4 months ago but about 1 year ago his hemoglobin was 12.9.  Patient reports ongoing fatigue over the last 1 year.  His appetite is good and his weight has been stable.  He denies any lumps or bumps anywhere or drenching night sweats.  Patient does report ongoing left-sided groin pain.  He did have an ultrasound which did not reveal any adenopathy or mass.  Also reports chronic back pain but denies any other acute joint pain or joint swelling.  No skin rash.  Results of blood work from 03/05/2018 were as follows: CBC showed white count of 6.5, H&H of 11.9/35 with an MCV of 101 and a platelet count of 228.  CMP was normal except for a mildly elevated creatinine of 1.3.  LDH was normal at 122 reticulocyte count was low normal at 1.9.  Multiple myeloma  panel did not reveal any M protein serum immunofixation was normal.  Free light chain showed mildly elevated kappa of 23.8 with a normal kappa lambda light chain ratio of 1.5.  Haptoglobin was low at 21 cold agglutinin titer was positive at 1 is to 1024 Coombs test was positive for complement and negative for IgG smear review showed macrocytic anemia with rouleaux formation.  Negative for schistocytes iron studies were within normal limits   Interval history- continues to feel fatigued. Denies other complaints  ECOG PS- 1 Pain scale- 0 Opioid associated constipation- no  Review of systems- Review of Systems  Constitutional: Positive for malaise/fatigue. Negative for chills, fever and weight loss.  HENT: Negative for congestion, ear discharge and nosebleeds.   Eyes: Negative for blurred vision.  Respiratory: Negative for cough, hemoptysis, sputum production, shortness of breath and wheezing.   Cardiovascular: Negative for chest pain, palpitations, orthopnea and claudication.  Gastrointestinal: Negative for abdominal pain, blood in stool, constipation, diarrhea, heartburn, melena, nausea and vomiting.  Genitourinary: Negative for dysuria, flank pain, frequency, hematuria and urgency.  Musculoskeletal: Negative for back pain, joint pain and myalgias.  Skin: Negative for rash.  Neurological: Negative for dizziness, tingling, focal weakness, seizures, weakness and headaches.  Endo/Heme/Allergies: Does not bruise/bleed easily.  Psychiatric/Behavioral: Negative for depression and suicidal ideas. The patient does not have insomnia.       Allergies  Allergen Reactions  . Bee Venom Swelling  . Sulfasalazine Hives and Swelling  . Sulfa Antibiotics Hives and Swelling  .  Warfarin And Related Other (See Comments)    Chest pain     Past Medical History:  Diagnosis Date  . Cancer [facility])    Prostate, followed by Dr. Jacqlyn Larsen  . Colon polyp   . Coronary artery disease   . Hyperlipidemia   .  Hypertension   . MI (myocardial infarction) ([place])    2015  . PAF (paroxysmal atrial fibrillation) ([place])    a. on xarelto  . Skin cancer      Past Surgical History:  Procedure Laterality Date  . CARDIAC CATHETERIZATION  10/2013   [facility]  . CATARACT EXTRACTION  Oct. 3, 2012   right eye  . colonoscopy    . COLONOSCOPY W/ POLYPECTOMY  2015   Dr Rayann Heman  . COLONOSCOPY WITH PROPOFOL N/A 08/15/2016   Procedure: COLONOSCOPY WITH PROPOFOL;  Surgeon: [name], MD;  Location: [facility] ENDOSCOPY;  Service: Endoscopy;  Laterality: N/A;  . CORONARY ANGIOPLASTY  2009   2005; s/p stent  . ESOPHAGOGASTRODUODENOSCOPY (EGD) WITH PROPOFOL N/A 08/15/2016   Procedure: ESOPHAGOGASTRODUODENOSCOPY (EGD) WITH PROPOFOL;  Surgeon: [name], MD;  Location: [facility] ENDOSCOPY;  Service: Endoscopy;  Laterality: N/A;  . GIVENS CAPSULE STUDY N/A 09/26/2016   Procedure: GIVENS CAPSULE STUDY;  Surgeon: [name], MD;  Location: [facility] ENDOSCOPY;  Service: Endoscopy;  Laterality: N/A;  . HERNIA REPAIR  2013  . LUMBAR SPINE SURGERY    . UPPER GI ENDOSCOPY  Sept 2015   Dr Rayann Heman    Social History   Socioeconomic History  . Marital status: Married    Spouse name: Not on file  . Number of children: Not on file  . Years of education: Not on file  . Highest education level: Not on file  Occupational History  . Not on file  Social Needs  . Financial resource strain: Not on file  . Food insecurity:    Worry: Not on file    Inability: Not on file  . Transportation needs:    Medical: Not on file    Non-medical: Not on file  Tobacco Use  . Smoking status: Never Smoker  . Smokeless tobacco: Never Used  Substance and Sexual Activity  . Alcohol use: No  . Drug use: No  . Sexual activity: Not Currently  Lifestyle  . Physical activity:    Days per week: Not on file    Minutes per session: Not on file  . Stress: Not on file  Relationships  . Social connections:    Talks on phone: Not on file    Gets together: Not on  file    Attends religious service: Not on file    Active member of club or organization: Not on file    Attends meetings of clubs or organizations: Not on file    Relationship status: Not on file  . Intimate partner violence:    Fear of current or ex partner: Not on file    Emotionally abused: Not on file    Physically abused: Not on file    Forced sexual activity: Not on file  Other Topics Concern  . Not on file  Social History Narrative  . Not on file    Family History  Problem Relation Age of Onset  . Stroke Mother   . Colon cancer Father   . Coronary artery disease Brother   . Other Brother 31       lymes dz     Current Outpatient Medications:  .  diltiazem (CARDIZEM CD) 120 MG  24 hr capsule, Take 1 capsule (120 mg total) by mouth daily., Disp: 90 capsule, Rfl: 4 .  esomeprazole (NEXIUM) 40 MG capsule, TAKE 1 CAPSULE BY MOUTH  DAILY AT 12 NOON, Disp: 90 capsule, Rfl: 3 .  fenofibrate micronized (ANTARA) 130 MG capsule, TAKE 1 CAPSULE BY MOUTH  DAILY BEFORE BREAKFAST, Disp: 90 capsule, Rfl: 1 .  finasteride (PROSCAR) 5 MG tablet, Take 1 tablet (5 mg total) by mouth daily., Disp: 90 tablet, Rfl: 2 .  levobunolol (BETAGAN) 0.5 % ophthalmic solution, Place 1 drop into the right eye 2 (two) times daily. , Disp: , Rfl:  .  lisinopril (PRINIVIL,ZESTRIL) 5 MG tablet, TAKE 1 TABLET BY MOUTH  DAILY, Disp: 90 tablet, Rfl: 3 .  metoprolol tartrate (LOPRESSOR) 25 MG tablet, Take 1 tablet (25 mg total) by mouth 2 (two) times daily., Disp: 180 tablet, Rfl: 3 .  Omega-3 Fatty Acids (FISH OIL) 1000 MG CAPS, Take 1,000 mg by mouth daily., Disp: , Rfl:  .  pravastatin (PRAVACHOL) 40 MG tablet, Take 1 tablet (40 mg total) by mouth at bedtime., Disp: 90 tablet, Rfl: 3 .  Tamsulosin HCl (FLOMAX) 0.4 MG CAPS, Take 0.4 mg by mouth daily., Disp: , Rfl:  .  XARELTO 20 MG TABS tablet, TAKE 1 TABLET DAILY, Disp: 90 tablet, Rfl: 3 .  amiodarone (PACERONE) 200 MG tablet, Take 1 tablet (200 mg total) by  mouth daily as needed. (Patient not taking: Reported on 03/19/2018), Disp: 90 tablet, Rfl: 4 .  cyproheptadine (PERIACTIN) 4 MG tablet, Take 1 tablet (4 mg total) by mouth 3 (three) times daily as needed for allergies. (Patient not taking: Reported on 03/05/2018), Disp: 90 tablet, Rfl: 3 .  ferrous sulfate 325 (65 FE) MG tablet, Take 1 tablet (325 mg total) by mouth 2 (two) times daily with a meal. (Patient not taking: Reported on 03/05/2018), Disp: 124 tablet, Rfl: 2 .  nitroGLYCERIN (NITROSTAT) 0.4 MG SL tablet, DISSOLVE UNDER THE TONGUE 1 TABLET EVERY 5 MINUTES AS NEEDED FOR CHEST PAIN (Patient not taking: Reported on 03/05/2018), Disp: 25 tablet, Rfl: 3  Physical exam:  Vitals:   03/19/18 1514  BP: 122/71  Pulse: 62  Resp: 18  Temp: 98.7 F (37.1 C)  TempSrc: Tympanic  SpO2: 95%  Weight: 221 lb 11.2 oz (100.6 kg)  Height: 6' 3.98" (1.93 m)   Physical Exam  Constitutional: He is oriented to person, place, and time. He appears well-developed and well-nourished.  HENT:  Head: Normocephalic and atraumatic.  Eyes: Pupils are equal, round, and reactive to light. EOM are normal.  Neck: Normal range of motion.  Cardiovascular: Normal rate, regular rhythm and normal heart sounds.  Pulmonary/Chest: Effort normal and breath sounds normal.  Abdominal: Soft. Bowel sounds are normal.  Neurological: He is alert and oriented to person, place, and time.  Skin: Skin is warm and dry.     CMP Latest Ref Rng & Units 03/05/2018  Glucose 70 - 99 mg/dL 132(H)  BUN 8 - 23 mg/dL 28(H)  Creatinine 0.61 - 1.24 mg/dL 1.30(H)  Sodium 135 - 145 mmol/L 138  Potassium 3.5 - 5.1 mmol/L 4.1  Chloride 98 - 111 mmol/L 109  CO2 22 - 32 mmol/L 22  Calcium 8.9 - 10.3 mg/dL 9.2  Total Protein 6.5 - 8.1 g/dL 6.7  Total Bilirubin 0.3 - 1.2 mg/dL 1.1  Alkaline Phos 38 - 126 U/L 27(L)  AST 15 - 41 U/L 31  ALT 0 - 44 U/L 21   CBC Latest  Ref Rng & Units 03/05/2018  WBC 3.8 - 10.6 K/uL 6.5  Hemoglobin 13.0 - 18.0 g/dL  11.9(L)  Hematocrit 40.0 - 52.0 % 35.0(L)  Platelets 150 - 440 K/uL 228    No images are attached to the encounter.  [place] Lt Lower Extrem Ltd Soft Tissue Non Vascular  Result Date: 02/26/2018 CLINICAL DATA:  84 year old male with left groin pain. Patient reports prior left side hernia repair. EXAM: ULTRASOUND LEFT LOWER EXTREMITY LIMITED TECHNIQUE: Ultrasound examination of the lower extremity soft tissues was performed in the area of clinical concern. COMPARISON:  CT Abdomen and Pelvis 09/24/2013. FINDINGS: Grayscale images of the left groin area of pain both without and with Valsalva demonstrate normal fibromuscular architecture. No fluid collection, mass or hernia is identified. IMPRESSION: No ultrasound abnormality in the area of groin pain. Electronically Signed   By: Genevie Ann M.D.   On: 02/26/2018 16:20     Assessment and plan- Patient is a 84 y.o. male with cold agglutinin disease  I discussed with the patient in detail. Overall patient's anemia has improved from 10.7-11.9 over the last 1 month.  His reticulocyte count LDH and total bilirubin are normal and there is a mildly low haptoglobin indicating a low level of hemolysis.  His cold agglutinin titer was positive and Coombs studies revealed positive complement.  Results of myeloma panel were unremarkable.  He does have a mildly elevated kappa light chain which is likely secondary to his kidney issues.  I did discuss the possibility of getting a CT chest abdomen and pelvis to rule out any underlying lymphoproliferative disorder as a cause of cold agglutinin disease.  However his hemoglobin is already improving since a month ago and it is possible that this may be due to a preceding viral illness or walking pneumonia that the patient may have had.  At this point plan is to repeat a CBC with differential in 6 weeks and 12 weeks and I will see him back in 12 weeks.  I will repeat CBC, CMP haptoglobin and reticulocyte count in 3 months.    If his  hemoglobin does not improve or starts trending down again I will obtain scans to rule out malignancy at that time.   Visit Diagnosis 1. Cold agglutinin disease ([place])      Dr. Randa Evens, MD, MPH [facility] [phone] 03/20/2018 8:29 AM

## 2018-03-24 DIAGNOSIS — M5441 Lumbago with sciatica, right side: Secondary | ICD-10-CM | POA: Diagnosis not present

## 2018-03-24 DIAGNOSIS — M5442 Lumbago with sciatica, left side: Secondary | ICD-10-CM | POA: Diagnosis not present

## 2018-03-24 DIAGNOSIS — M9903 Segmental and somatic dysfunction of lumbar region: Secondary | ICD-10-CM | POA: Diagnosis not present

## 2018-03-27 DIAGNOSIS — C44229 Squamous cell carcinoma of skin of left ear and external auricular canal: Secondary | ICD-10-CM | POA: Diagnosis not present

## 2018-04-06 DIAGNOSIS — R600 Localized edema: Secondary | ICD-10-CM | POA: Diagnosis not present

## 2018-04-06 DIAGNOSIS — I10 Essential (primary) hypertension: Secondary | ICD-10-CM | POA: Diagnosis not present

## 2018-04-06 DIAGNOSIS — N183 Chronic kidney disease, stage 3 (moderate): Secondary | ICD-10-CM | POA: Diagnosis not present

## 2018-04-13 ENCOUNTER — Other Ambulatory Visit: Payer: Self-pay | Admitting: Cardiovascular Disease

## 2018-04-14 ENCOUNTER — Other Ambulatory Visit: Payer: Self-pay | Admitting: Cardiovascular Disease

## 2018-04-15 ENCOUNTER — Ambulatory Visit: Payer: Medicare HMO | Admitting: Gastroenterology

## 2018-04-17 ENCOUNTER — Inpatient Hospital Stay: Payer: Medicare HMO

## 2018-04-17 DIAGNOSIS — D5912 Cold autoimmune hemolytic anemia: Secondary | ICD-10-CM

## 2018-04-17 DIAGNOSIS — D591 Other autoimmune hemolytic anemias: Secondary | ICD-10-CM | POA: Diagnosis not present

## 2018-04-17 LAB — CBC WITH DIFFERENTIAL/PLATELET
BASOS PCT: 1 %
Basophils Absolute: 0 10*3/uL (ref 0–0.1)
EOS ABS: 0 10*3/uL (ref 0–0.7)
EOS PCT: 1 %
HCT: 34.3 % — ABNORMAL LOW (ref 40.0–52.0)
Hemoglobin: 11.6 g/dL — ABNORMAL LOW (ref 13.0–18.0)
Lymphocytes Relative: 22 %
Lymphs Abs: 1.3 10*3/uL (ref 1.0–3.6)
MCH: 34.5 pg — AB (ref 26.0–34.0)
MCHC: 33.9 g/dL (ref 32.0–36.0)
MCV: 101.6 fL — ABNORMAL HIGH (ref 80.0–100.0)
MONO ABS: 0.5 10*3/uL (ref 0.2–1.0)
Monocytes Relative: 8 %
Neutro Abs: 4.1 10*3/uL (ref 1.4–6.5)
Neutrophils Relative %: 68 %
Platelets: 208 10*3/uL (ref 150–440)
RBC: 3.38 MIL/uL — ABNORMAL LOW (ref 4.40–5.90)
RDW: 13.4 % (ref 11.5–14.5)
WBC: 6 10*3/uL (ref 3.8–10.6)

## 2018-04-28 ENCOUNTER — Other Ambulatory Visit: Payer: Self-pay | Admitting: Family Medicine

## 2018-05-05 DIAGNOSIS — M9903 Segmental and somatic dysfunction of lumbar region: Secondary | ICD-10-CM | POA: Diagnosis not present

## 2018-05-05 DIAGNOSIS — M5134 Other intervertebral disc degeneration, thoracic region: Secondary | ICD-10-CM | POA: Diagnosis not present

## 2018-05-05 DIAGNOSIS — M9902 Segmental and somatic dysfunction of thoracic region: Secondary | ICD-10-CM | POA: Diagnosis not present

## 2018-05-05 DIAGNOSIS — S39012A Strain of muscle, fascia and tendon of lower back, initial encounter: Secondary | ICD-10-CM | POA: Diagnosis not present

## 2018-05-07 ENCOUNTER — Encounter: Payer: Self-pay | Admitting: Oncology

## 2018-05-09 DIAGNOSIS — H40051 Ocular hypertension, right eye: Secondary | ICD-10-CM | POA: Diagnosis not present

## 2018-05-09 DIAGNOSIS — I25119 Atherosclerotic heart disease of native coronary artery with unspecified angina pectoris: Secondary | ICD-10-CM | POA: Diagnosis not present

## 2018-05-09 DIAGNOSIS — D591 Other autoimmune hemolytic anemias: Secondary | ICD-10-CM | POA: Diagnosis not present

## 2018-05-09 DIAGNOSIS — K219 Gastro-esophageal reflux disease without esophagitis: Secondary | ICD-10-CM | POA: Diagnosis not present

## 2018-05-09 DIAGNOSIS — E785 Hyperlipidemia, unspecified: Secondary | ICD-10-CM | POA: Diagnosis not present

## 2018-05-09 DIAGNOSIS — I252 Old myocardial infarction: Secondary | ICD-10-CM | POA: Diagnosis not present

## 2018-05-09 DIAGNOSIS — J302 Other seasonal allergic rhinitis: Secondary | ICD-10-CM | POA: Diagnosis not present

## 2018-05-09 DIAGNOSIS — I10 Essential (primary) hypertension: Secondary | ICD-10-CM | POA: Diagnosis not present

## 2018-05-09 DIAGNOSIS — E663 Overweight: Secondary | ICD-10-CM | POA: Diagnosis not present

## 2018-05-09 DIAGNOSIS — M545 Low back pain: Secondary | ICD-10-CM | POA: Diagnosis not present

## 2018-05-11 DIAGNOSIS — M9903 Segmental and somatic dysfunction of lumbar region: Secondary | ICD-10-CM | POA: Diagnosis not present

## 2018-05-11 DIAGNOSIS — S39012A Strain of muscle, fascia and tendon of lower back, initial encounter: Secondary | ICD-10-CM | POA: Diagnosis not present

## 2018-05-25 DIAGNOSIS — R69 Illness, unspecified: Secondary | ICD-10-CM | POA: Diagnosis not present

## 2018-05-28 ENCOUNTER — Other Ambulatory Visit: Payer: Self-pay | Admitting: Family Medicine

## 2018-06-02 ENCOUNTER — Telehealth: Payer: Self-pay

## 2018-06-02 DIAGNOSIS — M9902 Segmental and somatic dysfunction of thoracic region: Secondary | ICD-10-CM | POA: Diagnosis not present

## 2018-06-02 DIAGNOSIS — M5135 Other intervertebral disc degeneration, thoracolumbar region: Secondary | ICD-10-CM | POA: Diagnosis not present

## 2018-06-02 NOTE — Telephone Encounter (Signed)
Copied from [place] [id number]. Topic: Referral - Request for Referral >> Jun 02, 2018 11:09 AM Scherrie Gerlach wrote: Has patient seen PCP for this complaint? no *If NO, is insurance requiring patient see PCP for this issue before PCP can refer them? 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  Pt states he doesn't need to see the dr, because all he will do is refer him out He would like to see someone asap.

## 2018-06-04 NOTE — Telephone Encounter (Signed)
Called and spoke with pt. Pt has an appt with Dr. Caryl Bis Friday will discuss the referral at that time.

## 2018-06-05 ENCOUNTER — Ambulatory Visit (INDEPENDENT_AMBULATORY_CARE_PROVIDER_SITE_OTHER): Payer: Medicare HMO

## 2018-06-05 ENCOUNTER — Ambulatory Visit: Payer: Medicare HMO | Admitting: Family Medicine

## 2018-06-05 VITALS — BP 120/64 | HR 68 | Temp 97.7°F | Wt 222.6 lb

## 2018-06-05 DIAGNOSIS — G8929 Other chronic pain: Secondary | ICD-10-CM

## 2018-06-05 DIAGNOSIS — I951 Orthostatic hypotension: Secondary | ICD-10-CM | POA: Diagnosis not present

## 2018-06-05 DIAGNOSIS — M1712 Unilateral primary osteoarthritis, left knee: Secondary | ICD-10-CM | POA: Diagnosis not present

## 2018-06-05 DIAGNOSIS — R1032 Left lower quadrant pain: Secondary | ICD-10-CM | POA: Diagnosis not present

## 2018-06-05 DIAGNOSIS — I480 Paroxysmal atrial fibrillation: Secondary | ICD-10-CM

## 2018-06-05 DIAGNOSIS — M25562 Pain in left knee: Secondary | ICD-10-CM

## 2018-06-05 DIAGNOSIS — N50819 Testicular pain, unspecified: Secondary | ICD-10-CM | POA: Diagnosis not present

## 2018-06-05 MED ORDER — POLYETHYLENE GLYCOL 3350 17 GM/SCOOP PO POWD: 17.0000 g | Freq: Two times a day (BID) | ORAL | 1 refills | Status: DC | PRN

## 2018-06-05 NOTE — Assessment & Plan Note (Signed)
Suspect related osteoarthritis.  Will obtain an x-ray today.  Consider referral to orthopedics after the x-ray returns.

## 2018-06-05 NOTE — Progress Notes (Signed)
Tommi Rumps, MD Phone: [phone]  William Taylor is a 84 y.o. male who presents today for follow-up.  CC: Left knee pain, left groin pain, A. fib, lightheaded  Left knee pain: Patient notes he has had chronic intermittent knee pain for some time.  He notes about 2 weeks ago his left knee became aggravated.  Notes mild swelling with it.  Notes pain in the knee.  He had trouble walking on it and it felt as though would catch on him at times.  He noted no injury.  Noted stretching sensation in the muscles behind his knee.  No leg swelling.  Groin pain: Patient previously reported left groin pain.  He has had that for a long time.  Today he points at his left lower quadrant as the area of the discomfort.  Notes it particularly bothers him at night after he gets up to go to the bathroom and gets back in bed.  He notes it is somewhat better compared to prior.  It is intermittent.  He had an ultrasound for hernia which was negative.  He does note chronic intermittent left testicular discomfort as well that has been going on for several years.  He does report being constipated with hard stools and having to strain.  He has a bowel movement every 3 to 4 days.  No blood in stool.  No diarrhea.  No nausea or vomiting.  No dysuria.  No hematuria.  He notes the testicular pain comes and goes.  A. fib: Currently taking Xarelto.  No palpitations or dyspnea.  No bleeding issues.  He follows with cardiology for this.  Lightheadedness: He reported this to his hematologist previously.  He would get lightheaded when he would stand up.  He reports his nephrologist decreased his dose of lisinopril.  He has not had any additional lightheadedness.  He does note he would check his blood pressure when going from sitting to standing and it would drop significantly.  Social History   Tobacco Use  Smoking Status Never Smoker  Smokeless Tobacco Never Used     ROS see history of present  illness  Objective  Physical Exam Vitals:   06/05/18 1019  BP: 120/64  Pulse: 68  Temp: 97.7 F (36.5 C)  SpO2: 96%    BP Readings from Last 3 Encounters:  06/05/18 120/64  03/19/18 122/71  03/05/18 112/67   Wt Readings from Last 3 Encounters:  06/05/18 222 lb 9.6 oz (101 kg)  03/19/18 221 lb 11.2 oz (100.6 kg)  03/05/18 221 lb (100.2 kg)    Physical Exam  Constitutional: No distress.  Cardiovascular: Normal rate, regular rhythm and normal heart sounds.  Pulmonary/Chest: Effort normal and breath sounds normal.  Abdominal: Soft. Bowel sounds are normal. He exhibits no distension. There is no tenderness. There is no rebound and no guarding.  Genitourinary:  Genitourinary Comments: Normal uncircumcised penis, normal scrotum, normal testicles, left epididymis with mild tenderness though no palpable abnormalities noted, normal vas deferens, no inguinal hernias  Musculoskeletal: He exhibits no edema.  Left knee with possible mild swelling with no warmth or erythema, no tenderness,, no ligamentous laxity, negative McMurray's, right knee with no swelling, warmth, erythema, tenderness, or ligamentous laxity, negative McMurray's  Neurological: He is alert.  Skin: Skin is warm and dry. He is not diaphoretic.     Assessment/Plan: Please see individual problem list.  Paroxysmal A-fib (HCC) Sinus rhythm on exam.  He will continue his current regimen.  Chronic pain in testicle Patient  reports chronic intermittent testicular discomfort.  We will order an ultrasound to evaluate this further.  Chronic pain of left knee Suspect related osteoarthritis.  Will obtain an x-ray today.  Consider referral to orthopedics after the x-ray returns.  Groin pain, chronic, left Chronic intermittent issue.  Today this is noted to be left lower quadrant discomfort.  He has a benign exam.  Potentially could be related to constipation.  We will treat him with MiraLAX.  He was given instructions on  tapering this up to twice daily.  If his constipation does not improve or if his abdominal discomfort does not improve he will let us know.  If his symptoms do not improve would consider CT imaging and it appears his hematologist plans on possibly doing this if his blood counts do not stay improved.  Orthostasis Previously was orthostatic.  Symptoms have resolved with decreased dose of lisinopril.  He will monitor for recurrence.   Orders Placed This Encounter  Procedures  . US SCROTUM W/DOPPLER    Standing Status:   Future    Standing Expiration Date:   08/06/2019    Order Specific Question:   Reason for Exam (SYMPTOM  OR DIAGNOSIS REQUIRED)    Answer:   chronic testicular discomfort, left sided, slight tenderness of left epididymis    Order Specific Question:   Preferred imaging location?    Answer:   [facility]  . DG Knee Complete 4 Views Left    Standing Status:   Future    Number of Occurrences:   1    Standing Expiration Date:   08/06/2019    Order Specific Question:   Reason for Exam (SYMPTOM  OR DIAGNOSIS REQUIRED)    Answer:   chronic intermittent left knee pain with flare recently, no injury    Order Specific Question:   Preferred imaging location?    Answer:   [facility] Specific Question:   Radiology Contrast Protocol - do NOT remove file path    Answer:   [email or web address]    Meds ordered this encounter  Medications  . polyethylene glycol powder (GLYCOLAX/MIRALAX) powder    Sig: Take 17 g by mouth 2 (two) times daily as needed.    Dispense:  3350 g    Refill:  [place], MD [place]

## 2018-06-05 NOTE — Assessment & Plan Note (Signed)
Patient reports chronic intermittent testicular discomfort.  We will order an ultrasound to evaluate this further.

## 2018-06-05 NOTE — Assessment & Plan Note (Signed)
Chronic intermittent issue.  Today this is noted to be left lower quadrant discomfort.  He has a benign exam.  Potentially could be related to constipation.  We will treat him with MiraLAX.  He was given instructions on tapering this up to twice daily.  If his constipation does not improve or if his abdominal discomfort does not improve he will let us know.  If his symptoms do not improve would consider CT imaging and it appears his hematologist plans on possibly doing this if his blood counts do not stay improved.

## 2018-06-05 NOTE — Assessment & Plan Note (Signed)
Sinus rhythm on exam.  He will continue his current regimen.

## 2018-06-05 NOTE — Patient Instructions (Signed)
Nice to see you. We will get an x-ray of your left knee today. We will get you set up for an ultrasound of your testicles. Please try the MiraLAX once daily for the next 3 to 4 days and if you are not having a good bowel movement you can increase to twice daily.  If this is not beneficial please let us know. If you develop worsening pain please be evaluated immediately.

## 2018-06-06 DIAGNOSIS — I951 Orthostatic hypotension: Secondary | ICD-10-CM | POA: Insufficient documentation

## 2018-06-06 NOTE — Assessment & Plan Note (Signed)
Previously was orthostatic.  Symptoms have resolved with decreased dose of lisinopril.  He will monitor for recurrence.

## 2018-06-07 ENCOUNTER — Other Ambulatory Visit: Payer: Self-pay | Admitting: Family Medicine

## 2018-06-07 DIAGNOSIS — Z760 Encounter for issue of repeat prescription: Secondary | ICD-10-CM

## 2018-06-08 ENCOUNTER — Telehealth: Payer: Self-pay | Admitting: Family Medicine

## 2018-06-08 NOTE — Telephone Encounter (Signed)
Copied from [place] [phone]. Topic: Quick Communication - See Telephone Encounter >> Jun 08, 2018 12:15 PM Blase Mess A wrote: CRM for notification. See Telephone encounter for: 06/08/18.  [name] is calling for advice for the follow up from the imaging.  The patient is is severe pain and has been staying in bed.  Patients wife wants to know what meds can the patient take.  Wanting to know if Dr. Conard Novak is going to referr him some where else. Please advise [phone]

## 2018-06-09 ENCOUNTER — Encounter: Payer: Self-pay | Admitting: *Deleted

## 2018-06-09 DIAGNOSIS — M1712 Unilateral primary osteoarthritis, left knee: Secondary | ICD-10-CM | POA: Diagnosis not present

## 2018-06-09 NOTE — Telephone Encounter (Signed)
Noted. I would suggest he go to [facility] given the amount of discomfort he is having. They should be open from 1-7 tonight. That will be the quickest way for him to be seen.

## 2018-06-09 NOTE — Telephone Encounter (Signed)
Please contact the patient and find out what pain they are referring to.  Please find out if he has any other symptoms related to this.  Is this related to his knee or his groin or his abdomen?  If this is related to his groin or his abdomen he needs to be evaluated immediately at urgent care or the emergency room if his pain is severe.

## 2018-06-09 NOTE — Telephone Encounter (Signed)
Patient says the problem is his knee not his abdomen he can hardly walk, he "agrees to be seen by whom ever you feel he needs to see he just needs relief"

## 2018-06-09 NOTE — Telephone Encounter (Signed)
Patient stated he will go on over to [facility]

## 2018-06-09 NOTE — Telephone Encounter (Signed)
Sent to PCP please advise.  

## 2018-06-10 ENCOUNTER — Other Ambulatory Visit: Payer: Self-pay | Admitting: Cardiovascular Disease

## 2018-06-11 NOTE — Telephone Encounter (Signed)
Refill Request.  

## 2018-06-15 ENCOUNTER — Telehealth: Payer: Self-pay | Admitting: Urology

## 2018-06-15 MED ORDER — TAMSULOSIN HCL 0.4 MG PO CAPS: 0.4000 mg | ORAL_CAPSULE | Freq: Every day | ORAL | 0 refills | Status: DC

## 2018-06-15 MED ORDER — TAMSULOSIN HCL 0.4 MG PO CAPS: 0.4000 mg | ORAL_CAPSULE | Freq: Every day | ORAL | 11 refills | Status: DC

## 2018-06-15 NOTE — Telephone Encounter (Signed)
Rx refilled, patient notified

## 2018-06-15 NOTE — Telephone Encounter (Signed)
Pt lmom asking for a refill of his Tamsulosin, pt did not leave any Rx details nor a call back number. Just that he has been out and needs a refill. Please advise. Thanks.

## 2018-06-15 NOTE — Addendum Note (Signed)
Addended by: Kyra Manges on: 06/15/2018 03:41 PM   Modules accepted: Orders

## 2018-06-17 ENCOUNTER — Ambulatory Visit
Admission: RE | Admit: 2018-06-17 | Discharge: 2018-06-17 | Disposition: A | Discharge status: Home / Self Care | Payer: Medicare HMO | Source: Ambulatory Visit | Attending: Family Medicine | Admitting: Family Medicine

## 2018-06-17 DIAGNOSIS — N433 Hydrocele, unspecified: Secondary | ICD-10-CM | POA: Diagnosis not present

## 2018-06-17 DIAGNOSIS — K4090 Unilateral inguinal hernia, without obstruction or gangrene, not specified as recurrent: Secondary | ICD-10-CM | POA: Insufficient documentation

## 2018-06-17 DIAGNOSIS — N50819 Testicular pain, unspecified: Secondary | ICD-10-CM | POA: Diagnosis present

## 2018-06-17 DIAGNOSIS — G8929 Other chronic pain: Secondary | ICD-10-CM

## 2018-06-18 ENCOUNTER — Other Ambulatory Visit: Payer: Self-pay | Admitting: Family Medicine

## 2018-06-18 DIAGNOSIS — K4090 Unilateral inguinal hernia, without obstruction or gangrene, not specified as recurrent: Secondary | ICD-10-CM

## 2018-06-25 ENCOUNTER — Encounter: Payer: Self-pay | Admitting: Oncology

## 2018-06-25 ENCOUNTER — Inpatient Hospital Stay: Payer: Medicare HMO | Admitting: Oncology

## 2018-06-25 ENCOUNTER — Inpatient Hospital Stay: Payer: Medicare HMO | Attending: Oncology

## 2018-06-25 VITALS — BP 110/72 | HR 57 | Temp 97.2°F | Resp 18 | Ht 75.98 in | Wt 220.0 lb

## 2018-06-25 DIAGNOSIS — I252 Old myocardial infarction: Secondary | ICD-10-CM

## 2018-06-25 DIAGNOSIS — Z79899 Other long term (current) drug therapy: Secondary | ICD-10-CM

## 2018-06-25 DIAGNOSIS — I480 Paroxysmal atrial fibrillation: Secondary | ICD-10-CM | POA: Diagnosis not present

## 2018-06-25 DIAGNOSIS — D7589 Other specified diseases of blood and blood-forming organs: Secondary | ICD-10-CM | POA: Insufficient documentation

## 2018-06-25 DIAGNOSIS — D5912 Cold autoimmune hemolytic anemia: Secondary | ICD-10-CM

## 2018-06-25 DIAGNOSIS — D591 Other autoimmune hemolytic anemias: Secondary | ICD-10-CM | POA: Insufficient documentation

## 2018-06-25 DIAGNOSIS — Z7901 Long term (current) use of anticoagulants: Secondary | ICD-10-CM | POA: Insufficient documentation

## 2018-06-25 DIAGNOSIS — Z85828 Personal history of other malignant neoplasm of skin: Secondary | ICD-10-CM | POA: Diagnosis not present

## 2018-06-25 DIAGNOSIS — I10 Essential (primary) hypertension: Secondary | ICD-10-CM | POA: Insufficient documentation

## 2018-06-25 DIAGNOSIS — Z8546 Personal history of malignant neoplasm of prostate: Secondary | ICD-10-CM | POA: Diagnosis not present

## 2018-06-25 LAB — CBC WITH DIFFERENTIAL/PLATELET
ABS IMMATURE GRANULOCYTES: 0.08 10*3/uL — AB (ref 0.00–0.07)
BASOS ABS: 0 10*3/uL (ref 0.0–0.1)
Basophils Relative: 1 %
Eosinophils Absolute: 0 10*3/uL (ref 0.0–0.5)
Eosinophils Relative: 0 %
HEMATOCRIT: 31.5 % — AB (ref 39.0–52.0)
HEMOGLOBIN: 10.5 g/dL — AB (ref 13.0–17.0)
Immature Granulocytes: 1 %
LYMPHS ABS: 1.4 10*3/uL (ref 0.7–4.0)
LYMPHS PCT: 17 %
MCH: 34.1 pg — AB (ref 26.0–34.0)
MCHC: 33.3 g/dL (ref 30.0–36.0)
MCV: 102.3 fL — ABNORMAL HIGH (ref 80.0–100.0)
MONO ABS: 0.5 10*3/uL (ref 0.1–1.0)
Monocytes Relative: 7 %
NEUTROS ABS: 6.1 10*3/uL (ref 1.7–7.7)
Neutrophils Relative %: 74 %
Platelets: 218 10*3/uL (ref 150–400)
RBC: 3.08 MIL/uL — AB (ref 4.22–5.81)
RDW: 14.2 % (ref 11.5–15.5)
WBC: 8.2 10*3/uL (ref 4.0–10.5)
nRBC: 0 % (ref 0.0–0.2)

## 2018-06-25 LAB — COMPREHENSIVE METABOLIC PANEL
ALBUMIN: 4 g/dL (ref 3.5–5.0)
ALT: 17 U/L (ref 0–44)
AST: 24 U/L (ref 15–41)
Alkaline Phosphatase: 27 U/L — ABNORMAL LOW (ref 38–126)
Anion gap: 7 (ref 5–15)
BUN: 28 mg/dL — AB (ref 8–23)
CHLORIDE: 107 mmol/L (ref 98–111)
CO2: 26 mmol/L (ref 22–32)
Calcium: 9.3 mg/dL (ref 8.9–10.3)
Creatinine, Ser: 1.22 mg/dL (ref 0.61–1.24)
GFR calc Af Amer: >60 mL/min (ref >60)
GFR calc non Af Amer: 52 mL/min — ABNORMAL LOW (ref >60)
Glucose, Bld: 145 mg/dL — ABNORMAL HIGH (ref 70–99)
POTASSIUM: 4.3 mmol/L (ref 3.5–5.1)
SODIUM: 140 mmol/L (ref 135–145)
Total Bilirubin: 1.4 mg/dL — ABNORMAL HIGH (ref 0.3–1.2)
Total Protein: 6.7 g/dL (ref 6.5–8.1)

## 2018-06-25 LAB — RETICULOCYTES
IMMATURE RETIC FRACT: 14.8 % (ref 2.3–15.9)
RBC.: 3.08 MIL/uL — ABNORMAL LOW (ref 4.22–5.81)
RETIC COUNT ABSOLUTE: 67.8 10*3/uL (ref 19.0–186.0)
Retic Ct Pct: 2.2 % (ref 0.4–3.1)

## 2018-06-25 NOTE — Progress Notes (Signed)
No new changes noted today 

## 2018-06-26 ENCOUNTER — Ambulatory Visit (INDEPENDENT_AMBULATORY_CARE_PROVIDER_SITE_OTHER): Payer: Medicare HMO

## 2018-06-26 VITALS — BP 124/64 | HR 60 | Temp 97.4°F | Resp 16 | Ht 76.0 in | Wt 213.8 lb

## 2018-06-26 DIAGNOSIS — Z0000 Encounter for general adult medical examination without abnormal findings: Secondary | ICD-10-CM

## 2018-06-26 LAB — HAPTOGLOBIN

## 2018-06-26 NOTE — Patient Instructions (Addendum)
  [name] , Thank you for taking time to come for your Medicare Wellness Visit. I appreciate your ongoing commitment to your health goals. Please review the following plan we discussed and let me know if I can assist you in the future.   Follow up as needed.    Keep all routine maintenance appointments.   Have a great day!  These are the goals we discussed: Goals      Patient Stated   . Increase physical activity (pt-stated)     Walk for exercise as tolerated for heart health. Stretch.       This is a list of the screening recommended for you and due dates:  Health Maintenance  Topic Date Due  . Tetanus Vaccine  05/04/2025  . Flu Shot  Completed  . Pneumonia vaccines  Completed

## 2018-06-26 NOTE — Progress Notes (Signed)
Note reviewed.  No patient concerns, follow up with PCP

## 2018-06-26 NOTE — Progress Notes (Signed)
Hematology/Oncology Consult note [facility]  Telephone:([phone] [phone]  Patient Care Team: [name], MD as PCP - General (Family Medicine) Bary Castilla, Forest Gleason, MD as Consulting Physician (General Surgery) [name], MD as Referring Physician (Internal Medicine) [name], MD as Consulting Physician (Cardiology)   Name of the patient: William Taylor  [id number]  Aug 04, 1930   Date of visit: 06/26/18  Diagnosis- cold agglutinin hemolytic anemia  Chief complaint/ Reason for visit- routine f/u of cold agglutinin hemolytic anemia  Heme/Onc history: patient is a 84 year old male with a past medical history significant for prostate cancer for which he follows up with urology. He also has chronic back pain,hypertension and stage III CKD hyperlipidemia and A. fib among other medical problems. He has been referred to [place] for evaluation and management of anemia. Recent CBC from 02/16/2018 showed white count of 6.1, H&H of 10.7/30.8 with an MCV of 99.9. He was found to have coldagglutinin inhis peripheral blood. TSH was within normal limits.B12 and folate were within normal limits.His hemoglobin was 10.7 about 4 months ago but about 1 year ago his hemoglobin was 12.9.  Patient reports ongoing fatigue over the last 1 year. His appetite is good and his weight has been stable. He denies any lumps or bumps anywhere or drenching night sweats. Patient does report ongoing left-sided groin pain. He did have an ultrasound which did not reveal any adenopathy or mass. Also reports chronic back pain but denies any other acute joint pain or joint swelling. No skin rash.  Results of blood work from 03/05/2018 were as follows: CBC showed white count of 6.5, H&H of 11.9/35 with an MCV of 101 and a platelet count of 228.  CMP was normal except for a mildly elevated creatinine of 1.3.  LDH was normal at 122 reticulocyte count was low  normal at 1.9.  Multiple myeloma panel did not reveal any M protein serum immunofixation was normal.  Free light chain showed mildly elevated kappa of 23.8 with a normal kappa lambda light chain ratio of 1.5.  Haptoglobin was low at 21 cold agglutinin titer was positive at 1 is to 1024 Coombs test was positive for complement and negative for IgG smear review showed macrocytic anemia with rouleaux formation.  Negative for schistocytes iron studies were within normal limits  Interval history- he still has ongoing fatigue. He had right groin pain and underwent usg scrotum which was unremarkable. Weight has been stable  ECOG PS- 1 Pain scale- 0 Opioid associated constipation- no  Review of systems- Review of Systems  Constitutional: Positive for malaise/fatigue. Negative for chills, fever and weight loss.  HENT: Negative for congestion, ear discharge and nosebleeds.   Eyes: Negative for blurred vision.  Respiratory: Negative for cough, hemoptysis, sputum production, shortness of breath and wheezing.   Cardiovascular: Negative for chest pain, palpitations, orthopnea and claudication.  Gastrointestinal: Negative for abdominal pain, blood in stool, constipation, diarrhea, heartburn, melena, nausea and vomiting.  Genitourinary: Negative for dysuria, flank pain, frequency, hematuria and urgency.  Musculoskeletal: Negative for back pain, joint pain and myalgias.  Skin: Negative for rash.  Neurological: Negative for dizziness, tingling, focal weakness, seizures, weakness and headaches.  Endo/Heme/Allergies: Does not bruise/bleed easily.  Psychiatric/Behavioral: Negative for depression and suicidal ideas. The patient does not have insomnia.       Allergies  Allergen Reactions  . Bee Venom Swelling  . Sulfasalazine Hives and Swelling  . Sulfa Antibiotics Hives and Swelling  .  Warfarin And Related Other (See Comments)    Chest pain     Past Medical History:  Diagnosis Date  . Cancer [facility])     Prostate, followed by Dr. Jacqlyn Larsen  . Colon polyp   . Coronary artery disease   . Hyperlipidemia   . Hypertension   . MI (myocardial infarction) ([place])    2015  . PAF (paroxysmal atrial fibrillation) ([place])    a. on xarelto  . Skin cancer      Past Surgical History:  Procedure Laterality Date  . CARDIAC CATHETERIZATION  10/[facility]   [facility]  . CATARACT EXTRACTION  Oct. 3, 2012   right eye  . colonoscopy    . COLONOSCOPY W/ POLYPECTOMY  2015   Dr Rayann Heman  . COLONOSCOPY WITH PROPOFOL N/A 08/15/2016   Procedure: COLONOSCOPY WITH PROPOFOL;  Surgeon: [name], MD;  Location: [facility] ENDOSCOPY;  Service: Endoscopy;  Laterality: N/A;  . CORONARY ANGIOPLASTY  2009   2005; s/p stent  . ESOPHAGOGASTRODUODENOSCOPY (EGD) WITH PROPOFOL N/A 08/15/2016   Procedure: ESOPHAGOGASTRODUODENOSCOPY (EGD) WITH PROPOFOL;  Surgeon: [name], MD;  Location: [facility] ENDOSCOPY;  Service: Endoscopy;  Laterality: N/A;  . GIVENS CAPSULE STUDY N/A 09/26/2016   Procedure: GIVENS CAPSULE STUDY;  Surgeon: [name], MD;  Location: [facility] ENDOSCOPY;  Service: Endoscopy;  Laterality: N/A;  . HERNIA REPAIR  2013  . LUMBAR SPINE SURGERY    . UPPER GI ENDOSCOPY  Sept 2015   Dr Rayann Heman    Social History   Socioeconomic History  . Marital status: Married    Spouse name: Not on file  . Number of children: Not on file  . Years of education: Not on file  . Highest education level: Not on file  Occupational History  . Not on file  Social Needs  . Financial resource strain: Not on file  . Food insecurity:    Worry: Not on file    Inability: Not on file  . Transportation needs:    Medical: Not on file    Non-medical: Not on file  Tobacco Use  . Smoking status: Never Smoker  . Smokeless tobacco: Never Used  Substance and Sexual Activity  . Alcohol use: No  . Drug use: No  . Sexual activity: Not Currently  Lifestyle  . Physical activity:    Days per week: Not on file    Minutes per session: Not on file  . Stress: Not on file   Relationships  . Social connections:    Talks on phone: Not on file    Gets together: Not on file    Attends religious service: Not on file    Active member of club or organization: Not on file    Attends meetings of clubs or organizations: Not on file    Relationship status: Not on file  . Intimate partner violence:    Fear of current or ex partner: Not on file    Emotionally abused: Not on file    Physically abused: Not on file    Forced sexual activity: Not on file  Other Topics Concern  . Not on file  Social History Narrative  . Not on file    Family History  Problem Relation Age of Onset  . Stroke Mother   . Colon cancer Father   . Coronary artery disease Brother   . Other Brother 64       lymes dz     Current Outpatient Medications:  .  diltiazem (CARDIZEM CD) 120 MG  24 hr capsule, TAKE 1 CAPSULE BY MOUTH  DAILY, Disp: 90 capsule, Rfl: 2 .  esomeprazole (NEXIUM) 40 MG capsule, TAKE 1 CAPSULE BY MOUTH  DAILY AT 12 NOON, Disp: 90 capsule, Rfl: 3 .  fenofibrate micronized (ANTARA) 130 MG capsule, TAKE 1 CAPSULE BY MOUTH  DAILY BEFORE BREAKFAST, Disp: 90 capsule, Rfl: 1 .  finasteride (PROSCAR) 5 MG tablet, Take 1 tablet (5 mg total) by mouth daily., Disp: 90 tablet, Rfl: 2 .  levobunolol (BETAGAN) 0.5 % ophthalmic solution, Place 1 drop into the right eye 2 (two) times daily. , Disp: , Rfl:  .  metoprolol tartrate (LOPRESSOR) 25 MG tablet, Take 1 tablet (25 mg total) by mouth 2 (two) times daily., Disp: 180 tablet, Rfl: 3 .  Omega-3 Fatty Acids (FISH OIL) 1000 MG CAPS, Take 1,000 mg by mouth daily., Disp: , Rfl:  .  pravastatin (PRAVACHOL) 40 MG tablet, TAKE 1 TABLET BY MOUTH AT  BEDTIME, Disp: 90 tablet, Rfl: 3 .  tamsulosin (FLOMAX) 0.4 MG CAPS capsule, Take 1 capsule (0.4 mg total) by mouth daily., Disp: 30 capsule, Rfl: 11 .  XARELTO 20 MG TABS tablet, TAKE 1 TABLET DAILY, Disp: 90 tablet, Rfl: 1 .  amiodarone (PACERONE) 200 MG tablet, TAKE 1 TABLET BY MOUTH  DAILY AS  NEEDED, Disp: 90 tablet, Rfl: 2 .  cyproheptadine (PERIACTIN) 4 MG tablet, Take 1 tablet (4 mg total) by mouth 3 (three) times daily as needed for allergies., Disp: 90 tablet, Rfl: 3 .  ferrous sulfate 325 (65 FE) MG tablet, Take 1 tablet (325 mg total) by mouth 2 (two) times daily with a meal. (Patient not taking: Reported on 03/05/2018), Disp: 124 tablet, Rfl: 2 .  lisinopril (PRINIVIL,ZESTRIL) 2.5 MG tablet, Take 2.5 mg by mouth daily., Disp: , Rfl:  .  nitroGLYCERIN (NITROSTAT) 0.4 MG SL tablet, DISSOLVE UNDER THE TONGUE 1 TABLET EVERY 5 MINUTES AS NEEDED FOR CHEST PAIN, Disp: 25 tablet, Rfl: 3 .  polyethylene glycol powder (GLYCOLAX/MIRALAX) powder, Take 17 g by mouth 2 (two) times daily as needed., Disp: 3350 g, Rfl: 1  Physical exam:  Vitals:   06/25/18 1051  BP: 110/72  Pulse: (!) 57  Resp: 18  Temp: (!) 97.2 F (36.2 C)  TempSrc: Tympanic  Weight: 220 lb (99.8 kg)  Height: 6' 3.98" (1.93 m)   Physical Exam  Constitutional: He is oriented to person, place, and time. He appears well-developed and well-nourished.  Elderly gentleman in no acute distress  HENT:  Head: Normocephalic and atraumatic.  Eyes: Pupils are equal, round, and reactive to light. EOM are normal.  Neck: Normal range of motion.  Cardiovascular: Normal rate, regular rhythm and normal heart sounds.  Pulmonary/Chest: Effort normal and breath sounds normal.  Abdominal: Soft. Bowel sounds are normal.  Neurological: He is alert and oriented to person, place, and time.  Skin: Skin is warm and dry.     CMP Latest Ref Rng & Units 06/25/2018  Glucose 70 - 99 mg/dL 145(H)  BUN 8 - 23 mg/dL 28(H)  Creatinine 0.61 - 1.24 mg/dL 1.22  Sodium 135 - 145 mmol/L 140  Potassium 3.5 - 5.1 mmol/L 4.3  Chloride 98 - 111 mmol/L 107  CO2 22 - 32 mmol/L 26  Calcium 8.9 - 10.3 mg/dL 9.3  Total Protein 6.5 - 8.1 g/dL 6.7  Total Bilirubin 0.3 - 1.2 mg/dL 1.4(H)  Alkaline Phos 38 - 126 U/L 27(L)  AST 15 - 41 U/L 24  ALT 0 - 44  U/L 17   CBC Latest Ref Rng & Units 06/25/2018  WBC 4.0 - 10.5 K/uL 8.2  Hemoglobin 13.0 - 17.0 g/dL 10.5(L)  Hematocrit 39.0 - 52.0 % 31.5(L)  Platelets 150 - 400 K/uL 218    No images are attached to the encounter.  Dg Knee Complete 4 Views Left  Result Date: 06/05/2018 CLINICAL DATA:  Left knee pain. EXAM: LEFT KNEE - COMPLETE 4+ VIEW COMPARISON:  No recent prior. FINDINGS: Moderate tricompartment degenerative change. No acute bony or joint abnormality. Peripheral vascular calcification. IMPRESSION: 1. Moderate tricompartment degenerative change. No acute bony abnormality. 2.  Peripheral vascular disease. Electronically Signed   By: [name]  Register   On: 06/05/2018 14:34   US Scrotum W/doppler  Result Date: 06/17/2018 CLINICAL DATA:  Initial evaluation for chronic left testicular pain. EXAM: SCROTAL ULTRASOUND DOPPLER ULTRASOUND OF THE TESTICLES TECHNIQUE: Complete ultrasound examination of the testicles, epididymis, and other scrotal structures was performed. Color and spectral Doppler ultrasound were also utilized to evaluate blood flow to the testicles. COMPARISON:  None. FINDINGS: Right testicle Measurements: 3.4 x 2.0 x 2.8 cm. Right testicle demonstrates a heterogeneous echotexture without discrete mass. No overt microlithiasis. Left testicle Measurements: 3.5 x 2.1 x 2.3 cm. Left testicle demonstrates a heterogeneous echotexture without discrete mass. No overt microlithiasis. Right epididymis:  Normal in size and appearance. Left epididymis: Left epididymis poorly visualized on this exam. No obvious abnormality identified. Hydrocele: Small simple bilateral hydroceles noted, of doubtful significance. Varicocele:  None visualized. Pulsed Doppler interrogation of both testes demonstrates normal low resistance arterial and venous waveforms bilaterally. Small mobile fat containing hernia noted within the right inguinal canal. IMPRESSION: 1. No acute abnormality identified about the scrotum or  testes. No discrete testicular mass identified. 2. Small fat containing right inguinal hernia. 3. Small simple bilateral hydroceles. Electronically Signed   By: Jeannine Boga M.D.   On: 06/17/2018 23:00     Assessment and plan- Patient is a 84 y.o. male with cold agglutinin hemolytic anemia here for routine follow-up  Patient had a normal hemoglobin up until July 2018 but since February 2019 his hemoglobin has been around 10 with no significant improvement.  Over the last 4 months he has developed more macrocytosis as well.  Anemia work-up was significant for cold agglutinin.  His Coombs test was positive for complement and cold agglutinin titer was 1: 1024.  Myeloma panel was unremarkable.  He does have a mildly elevated total bilirubin of 1.4 today.  And haptoglobin is less than 10.  Etiology of this cold agglutinin hemolytic anemia is unclear and I will therefore proceed with a CT chest abdomen and pelvis without contrast given his kidney issues in the past.  I will see him back in 2 weeks time to discuss the results of the CT scan and further management.  Other than fatigue patient does not have any other significant B symptoms.  I will consider single agent Rituxan based on the results of the CT scan   Visit Diagnosis 1. Cold agglutinin disease ([place])      Dr. Randa Evens, MD, MPH [facility] [phone] 06/26/2018 12:59 PM

## 2018-06-26 NOTE — Progress Notes (Signed)
Subjective:   William Taylor is a 84 y.o. male who presents for Medicare Annual/Subsequent preventive examination.  Review of Systems:  No ROS.  Medicare Wellness Visit. Additional risk factors are reflected in the social history.  Cardiac Risk Factors include: advanced age (>30men, >43 women);hypertension;male gender     Objective:    Vitals: BP 124/64 (BP Location: Left Arm, Patient Position: Sitting, Cuff Size: Normal)   Pulse 60   Temp (!) 97.4 F (36.3 C) (Oral)   Resp 16   Ht 6\' 4"  (1.93 m)   Wt 213 lb 12.8 oz (97 kg)   SpO2 95%   BMI 26.02 kg/m   Body mass index is 26.02 kg/m.  Advanced Directives 06/26/2018 06/25/2018 03/19/2018 03/05/2018 03/07/2017 01/14/2016 07/06/2015  Does Patient Have a Medical Advance Directive? No No Yes No Yes No No  Type of Advance Directive - Public librarian;Living will - [place];Living will - -  Does patient want to make changes to medical advance directive? - - No - Patient declined - No - Patient declined - -  Copy of [place] in Chart? - - Yes - No - copy requested - -  Would patient like information on creating a medical advance directive? No - Patient declined No - Patient declined No - Patient declined No - Patient declined - No - patient declined information No - patient declined information    Tobacco Social History   Tobacco Use  Smoking Status Never Smoker  Smokeless Tobacco Never Used     Counseling given: Not Answered   Clinical Intake:  Pre-visit preparation completed: Yes  Pain : 0-10 Pain Score: 6  Pain Location: Knee Pain Orientation: Left Pain Descriptors / Indicators: Aching, Discomfort Pain Frequency: Constant Pain Relieving Factors: No relief with last injection.  Scheduled for knee replacement consultation.  Effect of Pain on Daily Activities: Rest between activities.  He paces himself.   Pain Relieving Factors: No relief with last injection.   Scheduled for knee replacement consultation.   Nutritional Status: BMI 25 -29 Overweight Diabetes: No  How often do you need to have someone help you when you read instructions, pamphlets, or other written materials from your doctor or pharmacy?: 1 - Never  Interpreter Needed?: No     Past Medical History:  Diagnosis Date  . Cancer [facility])    Prostate, followed by Dr. Jacqlyn Larsen  . Colon polyp   . Coronary artery disease   . Hyperlipidemia   . Hypertension   . MI (myocardial infarction) ([place])    2015  . PAF (paroxysmal atrial fibrillation) ([place])    a. on xarelto  . Skin cancer    Past Surgical History:  Procedure Laterality Date  . CARDIAC CAT[facility]IZATION  [facility]   [facility]  . CATARACT EXTRACTION  Oct. 3, 2012   right eye  . colonoscopy    . COLONOSCOPY W/ POLYPECTOMY  2015   Dr Rayann Heman  . COLONOSCOPY WITH PROPOFOL N/A 08/15/2016   Procedure: COLONOSCOPY WITH PROPOFOL;  Surgeon: [name], MD;  Location: [facility] ENDOSCOPY;  Service: Endoscopy;  Laterality: N/A;  . CORONARY ANGIOPLASTY  2009   2005; s/p stent  . ESOPHAGOGASTRODUODENOSCOPY (EGD) WITH PROPOFOL N/A 08/15/2016   Procedure: ESOPHAGOGASTRODUODENOSCOPY (EGD) WITH PROPOFOL;  Surgeon: [name], MD;  Location: [facility] ENDOSCOPY;  Service: Endoscopy;  Laterality: N/A;  . GIVENS CAPSULE STUDY N/A 09/26/2016   Procedure: GIVENS CAPSULE STUDY;  Surgeon: [name], MD;  Location: [facility] ENDOSCOPY;  Service: Endoscopy;  Laterality: N/A;  . HERNIA REPAIR  2013  . LUMBAR SPINE SURGERY    . UPPER GI ENDOSCOPY  Sept 2015   Dr Rayann Heman   Family History  Problem Relation Age of Onset  . Stroke Mother   . Colon cancer Father   . Coronary artery disease Brother   . Other Brother 95       lymes dz   Social History   Socioeconomic History  . Marital status: Married    Spouse name: Not on file  . Number of children: Not on file  . Years of education: Not on file  . Highest education level: Not on file  Occupational History  . Not on file    Social Needs  . Financial resource strain: Not on file  . Food insecurity:    Worry: Not on file    Inability: Not on file  . Transportation needs:    Medical: Not on file    Non-medical: Not on file  Tobacco Use  . Smoking status: Never Smoker  . Smokeless tobacco: Never Used  Substance and Sexual Activity  . Alcohol use: No  . Drug use: No  . Sexual activity: Not Currently  Lifestyle  . Physical activity:    Days per week: Not on file    Minutes per session: Not on file  . Stress: Not on file  Relationships  . Social connections:    Talks on phone: Not on file    Gets together: Not on file    Attends religious service: Not on file    Active member of club or organization: Not on file    Attends meetings of clubs or organizations: Not on file    Relationship status: Not on file  Other Topics Concern  . Not on file  Social History Narrative  . Not on file    Outpatient Encounter Medications as of 06/26/2018  Medication Sig  . amiodarone (PACERONE) 200 MG tablet TAKE 1 TABLET BY MOUTH  DAILY AS NEEDED  . cyproheptadine (PERIACTIN) 4 MG tablet Take 1 tablet (4 mg total) by mouth 3 (three) times daily as needed for allergies.  Marland Kitchen diltiazem (CARDIZEM CD) 120 MG 24 hr capsule TAKE 1 CAPSULE BY MOUTH  DAILY  . esomeprazole (NEXIUM) 40 MG capsule TAKE 1 CAPSULE BY MOUTH  DAILY AT 12 NOON  . fenofibrate micronized (ANTARA) 130 MG capsule TAKE 1 CAPSULE BY MOUTH  DAILY BEFORE BREAKFAST  . finasteride (PROSCAR) 5 MG tablet Take 1 tablet (5 mg total) by mouth daily.  Marland Kitchen levobunolol (BETAGAN) 0.5 % ophthalmic solution Place 1 drop into the right eye 2 (two) times daily.   Marland Kitchen lisinopril (PRINIVIL,ZESTRIL) 2.5 MG tablet Take 2.5 mg by mouth at bedtime.   . metoprolol tartrate (LOPRESSOR) 25 MG tablet Take 1 tablet (25 mg total) by mouth 2 (two) times daily.  . nitroGLYCERIN (NITROSTAT) 0.4 MG SL tablet DISSOLVE UNDER THE TONGUE 1 TABLET EVERY 5 MINUTES AS NEEDED FOR CHEST PAIN  .  Omega-3 Fatty Acids (FISH OIL) 1000 MG CAPS Take 1,000 mg by mouth daily.  . polyethylene glycol powder (GLYCOLAX/MIRALAX) powder Take 17 g by mouth 2 (two) times daily as needed.  . pravastatin (PRAVACHOL) 40 MG tablet TAKE 1 TABLET BY MOUTH AT  BEDTIME  . tamsulosin (FLOMAX) 0.4 MG CAPS capsule Take 1 capsule (0.4 mg total) by mouth daily.  Alveda Reasons 20 MG TABS tablet TAKE 1 TABLET DAILY  . ferrous sulfate 325 (65  FE) MG tablet Take 1 tablet (325 mg total) by mouth 2 (two) times daily with a meal. (Patient not taking: Reported on 03/05/2018)  . [DISCONTINUED] fenofibrate micronized (ANTARA) 130 MG capsule TAKE 1 CAPSULE BY MOUTH  DAILY BEFORE BREAKFAST   No facility-administered encounter medications on file as of 06/26/2018.     Activities of Daily Living In your present state of health, do you have any difficulty performing the following activities: 06/26/2018  Hearing? N  Vision? N  Difficulty concentrating or making decisions? N  Walking or climbing stairs? Y  Comment L knee pain  Dressing or bathing? N  Doing errands, shopping? N  Preparing Food and eating ? N  Using the Toilet? N  In the past six months, have you accidently leaked urine? N  Do you have problems with loss of bowel control? N  Managing your Medications? N  Managing your Finances? N  Housekeeping or managing your Housekeeping? N  Some recent data might be hidden    Patient Care Team: [name], MD as PCP - General (Family Medicine) Bary Castilla, Forest Gleason, MD as Consulting Physician (General Surgery) [name], MD as Referring Physician (Internal Medicine) [name], MD as Consulting Physician (Cardiology)   Assessment:   This is a routine wellness examination for Fowler.  The goal of the wellness visit is to assist the patient how to close the gaps in care and create a preventative care plan for the patient.   The roster of all physicians providing medical care to patient is listed in  the Snapshot section of the chart.  Safety issues reviewed; Smoke and carbon monoxide detectors in the home. No firearms in the home. Wears seatbelts when driving or riding with others. No violence in the home.  They do not have excessive sun exposure.  Discussed the need for sun protection: hats, long sleeves and the use of sunscreen if there is significant sun exposure.  Patient is alert, normal appearance, oriented to person/place/and time.  Correctly identified the president of the [place] and recalls of 2/3 words. Performs simple calculations and can read correct time from watch face.  Displays appropriate judgement.  No new identified risk were noted.  No failures at ADL's or IADL's.    BMI- discussed the importance of a healthy diet, water intake and the benefits of aerobic exercise. Educational material provided.   24 hour diet recall: Heart healthy diet  Dental- every 6 months.  Sleep patterns- Sleep is fair.   Health maintenance gaps- closed.  Patient Concerns: None at this time. Follow up with PCP as needed.  Exercise Activities and Dietary recommendations Current Exercise Habits: The patient does not participate in regular exercise at present  Goals      Patient Stated   . Increase physical activity (pt-stated)     Walk for exercise as tolerated for heart health. Stretch.       Fall Risk Fall Risk  06/26/2018 07/16/2017 06/28/2016 07/23/2012  Falls in the past year? 0 No No No   Depression Screen PHQ 2/9 Scores 06/26/2018 07/16/2017 06/28/2016  PHQ - 2 Score 0 0 0    Cognitive Function     6CIT Screen 06/26/2018  What Year? 0 points  What month? 0 points  What time? 0 points  Count back from 20 0 points  Months in reverse 0 points  Repeat phrase 0 points  Total Score 0    Immunization History  Administered Date(s) Administered  . Influenza Split  05/16/2011, 05/12/2012  . Influenza, High Dose Seasonal PF 05/05/2015, 05/03/2016, 04/24/2017,  05/25/2018  . Influenza,inj,Quad PF,6+ Mos 05/19/2013, 04/15/2014  . Pneumococcal Conjugate-13 12/01/2013  . Pneumococcal Polysaccharide-23 01/22/2011  . Tdap 05/05/2015  . Zoster 01/24/2012   Screening Tests Health Maintenance  Topic Date Due  . TETANUS/TDAP  05/04/2025  . INFLUENZA VACCINE  Completed  . PNA vac Low Risk Adult  Completed      Plan:   End of life planning; Advanced aging; Advanced directives discussed.  No HCPOA/Living Will.  Additional information declined at this time.  I have personally reviewed and noted the following in the patient's chart:   . Medical and social history . Use of alcohol, tobacco or illicit drugs  . Current medications and supplements . Functional ability and status . Nutritional status . Physical activity . Advanced directives . List of other physicians . Hospitalizations, surgeries, and ER visits in previous 12 months . Vitals . Screenings to include cognitive, depression, and falls . Referrals and appointments  In addition, I have reviewed and discussed with patient certain preventive protocols, quality metrics, and best practice recommendations. A written personalized care plan for preventive services as well as general preventive health recommendations were provided to patient.     Varney Biles, LPN  25/11/2704

## 2018-06-29 DIAGNOSIS — M5135 Other intervertebral disc degeneration, thoracolumbar region: Secondary | ICD-10-CM | POA: Diagnosis not present

## 2018-06-29 DIAGNOSIS — M9902 Segmental and somatic dysfunction of thoracic region: Secondary | ICD-10-CM | POA: Diagnosis not present

## 2018-06-30 ENCOUNTER — Ambulatory Visit: Payer: Medicare HMO | Admitting: Surgery

## 2018-06-30 ENCOUNTER — Other Ambulatory Visit: Payer: Self-pay

## 2018-06-30 ENCOUNTER — Encounter: Payer: Self-pay | Admitting: Surgery

## 2018-06-30 VITALS — BP 134/87 | HR 65 | Temp 96.6°F | Resp 16 | Ht 76.0 in | Wt 218.4 lb

## 2018-06-30 DIAGNOSIS — H401112 Primary open-angle glaucoma, right eye, moderate stage: Secondary | ICD-10-CM | POA: Diagnosis not present

## 2018-06-30 DIAGNOSIS — R1030 Lower abdominal pain, unspecified: Secondary | ICD-10-CM

## 2018-06-30 NOTE — Progress Notes (Signed)
Surgical Clinic History and Physical  Referring provider:  [name], MD [address] [place], [place] [address]  HISTORY OF PRESENT ILLNESS (HPI):  84 y.o. male presents for evaluation of intermittent LLQ abdominal pain, which he says radiates from his Left lateral groin and is worse when he turns side-to-side or with constipation. He previously underwent Left inguinal hernia repair with mesh Tamala Taylor, 11/05/2011) and followed up with Dr. Tamala Taylor within the past 2 years, at which time he was told there was no evidence of recurrent Left inguinal hernia (though documentation for this reported encountered was not readily found). 8 months ago, patient reports prolonged LLQ abdominal pain, which has since resolved. Patient reports +flatus and +BM with a single episode of constipation that resolved following a dose of Miralax prescribed by patient's primary care physician. Patient denies abdominal pain otherwise, abdominal distention, diarrhea, N/V, frequent coughing (non-smoker), straining with urination despite known prostate cancer, fever/chills, unintentional weight loss, or blood with BM's. He says he was referred for evaluation following a recent scrotal ultrasound for Left testicular pain which suggested possibility of small fat-containing Right inguinal hernia. Patient denies any Right-sided groin or abdominal pain/symptoms. Patient likewise denies any CP or SOB, though previously experienced NSTEMI after Plavix was held for colonoscopy. He currently takes Xarelto.  PAST MEDICAL HISTORY (PMH):  Past Medical History:  Diagnosis Date  . Cancer [facility])    Prostate, followed by Dr. Jacqlyn Larsen  . Colon polyp   . Coronary artery disease   . Hyperlipidemia   . Hypertension   . MI (myocardial infarction) ([place])    2015  . PAF (paroxysmal atrial fibrillation) ([place])    a. on xarelto  . Skin cancer     PAST SURGICAL HISTORY ([place]):  Past Surgical History:  Procedure Laterality Date  .  CARDIAC CATHETERIZATION  10/2013   [facility]  . CATARACT EXTRACTION  Oct. 3, 2012   right eye  . colonoscopy    . COLONOSCOPY W/ POLYPECTOMY  2015   Dr Rayann Heman  . COLONOSCOPY WITH PROPOFOL N/A 08/15/2016   Procedure: COLONOSCOPY WITH PROPOFOL;  Surgeon: [name], MD;  Location: [facility] ENDOSCOPY;  Service: Endoscopy;  Laterality: N/A;  . CORONARY ANGIOPLASTY  2009   2005; s/p stent  . ESOPHAGOGASTRODUODENOSCOPY (EGD) WITH PROPOFOL N/A 08/15/2016   Procedure: ESOPHAGOGASTRODUODENOSCOPY (EGD) WITH PROPOFOL;  Surgeon: [name], MD;  Location: [facility] ENDOSCOPY;  Service: Endoscopy;  Laterality: N/A;  . GIVENS CAPSULE STUDY N/A 09/26/2016   Procedure: GIVENS CAPSULE STUDY;  Surgeon: [name], MD;  Location: [facility] ENDOSCOPY;  Service: Endoscopy;  Laterality: N/A;  . HERNIA REPAIR  2013  . LUMBAR SPINE SURGERY    . UPPER GI ENDOSCOPY  Sept 2015   Dr Rayann Heman    MEDICATIONS:  Prior to Admission medications   Medication Sig Start Date End Date Taking? Authorizing Provider  amiodarone (PACERONE) 200 MG tablet TAKE 1 TABLET BY MOUTH  DAILY AS NEEDED 04/14/18  Yes Gollan, Kathlene November, MD  cyproheptadine (PERIACTIN) 4 MG tablet Take 1 tablet (4 mg total) by mouth 3 (three) times daily as needed for allergies. 02/17/18  Yes [name], MD  diltiazem (CARDIZEM CD) 120 MG 24 hr capsule TAKE 1 CAPSULE BY MOUTH  DAILY 04/15/18  Yes Gollan, Kathlene November, MD  esomeprazole (NEXIUM) 40 MG capsule TAKE 1 CAPSULE BY MOUTH  DAILY AT 12 NOON 08/01/17  Yes [name], MD  fenofibrate micronized (ANTARA) 130 MG capsule TAKE 1 CAPSULE BY MOUTH  DAILY  BEFORE BREAKFAST 01/08/18  Yes [name], MD  finasteride (PROSCAR) 5 MG tablet Take 1 tablet (5 mg total) by mouth daily. 02/05/18  Yes Stoioff, Ronda Fairly, MD  levobunolol (BETAGAN) 0.5 % ophthalmic solution Place 1 drop into the right eye 2 (two) times daily.    Yes [provider]  lisinopril (PRINIVIL,ZESTRIL) 2.5 MG tablet Take 2.5 mg by mouth at bedtime.     Yes [provider]  metoprolol tartrate (LOPRESSOR) 25 MG tablet Take 1 tablet (25 mg total) by mouth 2 (two) times daily. 10/21/17  Yes Gollan, Kathlene November, MD  nitroGLYCERIN (NITROSTAT) 0.4 MG SL tablet DISSOLVE UNDER THE TONGUE 1 TABLET EVERY 5 MINUTES AS NEEDED FOR CHEST PAIN 06/11/16  Yes Gollan, Kathlene November, MD  Omega-3 Fatty Acids (FISH OIL) 1000 MG CAPS Take 1,000 mg by mouth daily.   Yes [provider]  polyethylene glycol powder (GLYCOLAX/MIRALAX) powder Take 17 g by mouth 2 (two) times daily as needed. 06/05/18  Yes [name], MD  pravastatin (PRAVACHOL) 40 MG tablet TAKE 1 TABLET BY MOUTH AT  BEDTIME 06/08/18  Yes [name], MD  tamsulosin (FLOMAX) 0.4 MG CAPS capsule Take 1 capsule (0.4 mg total) by mouth daily. 06/15/18  Yes Stoioff, Ronda Fairly, MD  XARELTO 20 MG TABS tablet TAKE 1 TABLET DAILY 06/12/18  Yes Gollan, Kathlene November, MD  ferrous sulfate 325 (65 FE) MG tablet Take 1 tablet (325 mg total) by mouth 2 (two) times daily with a meal. Patient not taking: Reported on 03/05/2018 10/17/16 02/16/18  [name], MD    ALLERGIES:  Allergies  Allergen Reactions  . Bee Venom Swelling  . Sulfasalazine Hives and Swelling  . Sulfa Antibiotics Hives and Swelling  . Warfarin And Related Other (See Comments)    Chest pain    SOCIAL HISTORY:  Social History   Socioeconomic History  . Marital status: Married    Spouse name: Not on file  . Number of children: Not on file  . Years of education: Not on file  . Highest education level: Not on file  Occupational History  . Not on file  Social Needs  . Financial resource strain: Not on file  . Food insecurity:    Worry: Not on file    Inability: Not on file  . Transportation needs:    Medical: Not on file    Non-medical: Not on file  Tobacco Use  . Smoking status: Never Smoker  . Smokeless tobacco: Never Used  Substance and Sexual Activity  . Alcohol use: No  . Drug use: No  . Sexual activity: Not  Currently  Lifestyle  . Physical activity:    Days per week: Not on file    Minutes per session: Not on file  . Stress: Not on file  Relationships  . Social connections:    Talks on phone: Not on file    Gets together: Not on file    Attends religious service: Not on file    Active member of club or organization: Not on file    Attends meetings of clubs or organizations: Not on file    Relationship status: Not on file  . Intimate partner violence:    Fear of current or ex partner: Not on file    Emotionally abused: Not on file    Physically abused: Not on file    Forced sexual activity: Not on file  Other Topics Concern  . Not on file  Social  History Narrative  . Not on file    The patient currently resides (home / rehab facility / nursing home): Home The patient normally is (ambulatory / bedbound): Ambulatory  FAMILY HISTORY:  Family History  Problem Relation Age of Onset  . Stroke Mother   . Colon cancer Father   . Coronary artery disease Brother   . Other Brother 60       lymes dz    Otherwise negative/non-contributory.  REVIEW OF SYSTEMS:  Constitutional: denies any other weight loss, fever, chills, or sweats  Eyes: denies any other vision changes, history of eye injury  ENT: denies sore throat, hearing problems  Respiratory: denies shortness of breath, wheezing  Cardiovascular: denies chest pain, palpitations  Gastrointestinal: abdominal pain, N/V, and bowel function as per HPI Musculoskeletal: denies any other joint pains or cramps  Skin: Denies any other rashes or skin discolorations Neurological: denies any other headache, dizziness, weakness  Psychiatric: Denies any other depression, anxiety   All other review of systems were otherwise negative   VITAL SIGNS:  BP 134/87   Pulse 65   Temp (!) 96.6 F (35.9 C) (Temporal)   Resp 16   Ht 6\' 4"  (1.93 m)   Wt 218 lb 6.4 oz (99.1 kg)   SpO2 98%   BMI 26.58 kg/m    PHYSICAL EXAM:  Constitutional:  --  Normal body habitus  -- Awake, alert, and oriented x3  Eyes:  -- Pupils equally round and reactive to light  -- No scleral icterus  Ear, nose, throat:  -- No jugular venous distension -- No nasal drainage, bleeding Pulmonary:  -- No crackles  -- Equal breath sounds bilaterally -- Breathing non-labored at rest Cardiovascular:  -- S1, S2 present  -- No pericardial rubs  Gastrointestinal:  -- Abdomen soft, nontender, non-distended, no guarding/rebound -- B/L no inguinal hernia appreciated on trans-scrotal or trans-abdominal exam -- No abdominal masses appreciated, pulsatile or otherwise -- Well-healed Left groin post-incisional scar Musculoskeletal and Integumentary:  -- Wounds or skin discoloration: None appreciated -- Extremities: B/L UE and LE FROM, hands and feet warm, no edema  Neurologic:  -- Motor function: Intact and symmetric -- Sensation: Intact and symmetric  Labs:  CBC Latest Ref Rng & Units 06/25/2018 04/17/2018 03/05/2018  WBC 4.0 - 10.5 K/uL 8.2 6.0 6.5  Hemoglobin 13.0 - 17.0 g/dL 10.5(L) 11.6(L) 11.9(L)  Hematocrit 39.0 - 52.0 % 31.5(L) 34.3(L) 35.0(L)  Platelets 150 - 400 K/uL 218 208 228   CMP Latest Ref Rng & Units 06/25/2018 03/05/2018 02/16/2018  Glucose 70 - 99 mg/dL 145(H) 132(H) 117(H)  BUN 8 - 23 mg/dL 28(H) 28(H) 30(H)  Creatinine 0.61 - 1.24 mg/dL 1.22 1.30(H) 1.36  Sodium 135 - 145 mmol/L 140 138 140  Potassium 3.5 - 5.1 mmol/L 4.3 4.1 4.4  Chloride 98 - 111 mmol/L 107 109 105  CO2 22 - 32 mmol/L 26 22 27   Calcium 8.9 - 10.3 mg/dL 9.3 9.2 9.4  Total Protein 6.5 - 8.1 g/dL 6.7 6.7 6.3  Total Bilirubin 0.3 - 1.2 mg/dL 1.4(H) 1.1 0.8  Alkaline Phos 38 - 126 U/L 27(L) 27(L) 24(L)  AST 15 - 41 U/L 24 31 19   ALT 0 - 44 U/L 17 21 17    Imaging studies:  Scrotal Ultrasound with Doppler (06/17/2018) 1. No acute abnormality identified about the scrotum or testes. No discrete testicular mass identified. 2. Small fat containing right inguinal hernia. 3.  Small simple bilateral hydroceles.  Assessment/Plan:  84 y.o. male with  intermittent LLQ abdominal/groin pain in absence of any detectable Left inguinal hernia with small asymptomatic clinically undetectable Right inguinal hernia vs cord lipoma reported on recent scrotal ultrasound, complicated by co-morbidities including HTN, HLD, CAD s/p NSTEMI with chronic therapeutic anticoagulation for atrial fibrillation, CKD, history of PE, prostate cancer being managed non-operatively, and a history of skin cancer.   - currently no indication for surgical intervention  - discussed with patient differential diagnoses and results of imaging studies  - hydration, high-fiber heart-healthy diet, and Colace stool softener to reduce constipation  - signs/symptoms of inguinal hernias, incarceration, and obstruction were likewise discussed  - all of patient's and his family's questions were answered to their expressed satisfaction   - return to clinic as needed, instructed to call if any questions or concerns  All of the above recommendations were discussed with the patient and patient's family, and all of patient's and family's questions were answered to their expressed satisfaction.  Thank you for the opportunity to participate in this patient's care.  -- Marilynne Drivers Rosana Hoes, MD, [place]: [facility] - Partnering for exceptional care. Office: [phone]

## 2018-06-30 NOTE — Patient Instructions (Addendum)
Patient has been informed to follow up as needed. Call the office with any questions or concerns.   Please keep your CT Scan appointment.

## 2018-07-01 ENCOUNTER — Emergency Department
Admission: EM | Admit: 2018-07-01 | Discharge: 2018-07-01 | Disposition: A | Discharge status: Home / Self Care | Payer: Medicare HMO | Attending: Emergency Medicine | Admitting: Emergency Medicine

## 2018-07-01 ENCOUNTER — Emergency Department: Payer: Medicare HMO

## 2018-07-01 ENCOUNTER — Other Ambulatory Visit: Payer: Self-pay

## 2018-07-01 DIAGNOSIS — Z7901 Long term (current) use of anticoagulants: Secondary | ICD-10-CM | POA: Diagnosis not present

## 2018-07-01 DIAGNOSIS — Z8546 Personal history of malignant neoplasm of prostate: Secondary | ICD-10-CM | POA: Insufficient documentation

## 2018-07-01 DIAGNOSIS — Z79899 Other long term (current) drug therapy: Secondary | ICD-10-CM | POA: Insufficient documentation

## 2018-07-01 DIAGNOSIS — N183 Chronic kidney disease, stage 3 (moderate): Secondary | ICD-10-CM | POA: Insufficient documentation

## 2018-07-01 DIAGNOSIS — R079 Chest pain, unspecified: Secondary | ICD-10-CM | POA: Diagnosis not present

## 2018-07-01 DIAGNOSIS — I129 Hypertensive chronic kidney disease with stage 1 through stage 4 chronic kidney disease, or unspecified chronic kidney disease: Secondary | ICD-10-CM | POA: Insufficient documentation

## 2018-07-01 DIAGNOSIS — I2510 Atherosclerotic heart disease of native coronary artery without angina pectoris: Secondary | ICD-10-CM | POA: Diagnosis not present

## 2018-07-01 DIAGNOSIS — R0789 Other chest pain: Secondary | ICD-10-CM | POA: Insufficient documentation

## 2018-07-01 LAB — CBC
HEMATOCRIT: 31.3 % — AB (ref 39.0–52.0)
Hemoglobin: 10.6 g/dL — ABNORMAL LOW (ref 13.0–17.0)
MCH: 34.3 pg — ABNORMAL HIGH (ref 26.0–34.0)
MCHC: 33.9 g/dL (ref 30.0–36.0)
MCV: 101.3 fL — ABNORMAL HIGH (ref 80.0–100.0)
NRBC: 0 % (ref 0.0–0.2)
Platelets: 226 10*3/uL (ref 150–400)
RBC: 3.09 MIL/uL — ABNORMAL LOW (ref 4.22–5.81)
RDW: 14.3 % (ref 11.5–15.5)
WBC: 9.2 10*3/uL (ref 4.0–10.5)

## 2018-07-01 LAB — TROPONIN I: Troponin I: <0.03 ng/mL (ref <0.03)

## 2018-07-01 LAB — BASIC METABOLIC PANEL
ANION GAP: 7 (ref 5–15)
BUN: 32 mg/dL — ABNORMAL HIGH (ref 8–23)
CHLORIDE: 105 mmol/L (ref 98–111)
CO2: 25 mmol/L (ref 22–32)
CREATININE: 1.29 mg/dL — AB (ref 0.61–1.24)
Calcium: 9.3 mg/dL (ref 8.9–10.3)
GFR calc non Af Amer: 48 mL/min — ABNORMAL LOW (ref >60)
GFR, EST AFRICAN AMERICAN: 56 mL/min — AB (ref >60)
Glucose, Bld: 105 mg/dL — ABNORMAL HIGH (ref 70–99)
POTASSIUM: 5.2 mmol/L — AB (ref 3.5–5.1)
SODIUM: 137 mmol/L (ref 135–145)

## 2018-07-01 NOTE — ED Provider Notes (Signed)
Repeat troponin negative. Will plan on discharging home.    Nance Pear, MD 07/01/18 [phone]

## 2018-07-01 NOTE — ED Triage Notes (Signed)
Pt having left chest pain radiating into jaw approx 1230 this afternoon. PT states felt like toothache but 10 min later took 2 nitro x2. Denies pain at this time. Hx of non stemi 2 years ago, states it feels like that. PT Aox4

## 2018-07-01 NOTE — ED Provider Notes (Signed)
[facility] Emergency Department Provider Note ____________________________________________   First MD Initiated Contact with Patient 07/01/18 1411     (approximate)  I have reviewed the triage vital signs and the nursing notes.   HISTORY  Chief Complaint Chest Pain    HPI William Taylor is a 84 y.o. male with PMH as noted below including CAD with prior MI presents with chest pain, acute onset approximate 1 hour prior to arrival the patient was sitting at his desk, nonexertional, lasting 20 minutes and now resolved.  He describes that it started in the left lateral side of his chest, radiated to the left upper quadrant and in the epigastric area of his abdomen.  He denies associated shortness of breath, nausea, or lightheadedness.  He states that he felt somewhat vaguely unwell but this has now resolved as well.   Past Medical History:  Diagnosis Date  . Cancer [facility])    Prostate, followed by Dr. Jacqlyn Larsen  . Colon polyp   . Coronary artery disease   . Hyperlipidemia   . Hypertension   . MI (myocardial infarction) ([place])    2015  . PAF (paroxysmal atrial fibrillation) ([place])    a. on xarelto  . Skin cancer     Patient Active Problem List   Diagnosis Date Noted  . Orthostasis 06/06/2018  . Chronic pain of left knee 06/05/2018  . Chronic pain in testicle 06/05/2018  . Chronic back pain 02/17/2018  . Chest pain 03/07/2017  . Angiodysplasia of intestinal tract   . Iron deficiency anemia   . Benign neoplasm of ascending colon   . Diverticulosis of large intestine without diverticulitis   . Gastric polyp   . Left rotator cuff tear arthropathy 09/11/2015  . Fatigue 04/15/2014  . Groin pain, chronic, left 01/18/2014  . Prostate cancer ([place]) 03/15/2013  . DOE (dyspnea on exertion) 11/23/2012  . Insomnia 09/03/2012  . Hypertension 07/23/2012  . Chronic kidney disease (CKD), stage III (moderate) ([place]) 07/23/2011  . Pulmonary embolism ([place]) 01/09/2011    . Paroxysmal A-fib ([place]) 09/06/2010  . Hyperlipidemia 05/19/2009  . Coronary artery disease of native artery of native heart with stable angina pectoris ([place]) 05/19/2009    Past Surgical History:  Procedure Laterality Date  . CARDIAC CATHETERIZATION  10/[facility]   [facility]  . CATARACT EXTRACTION  Oct. 3, 2012   right eye  . colonoscopy    . COLONOSCOPY W/ POLYPECTOMY  2015   Dr Rayann Heman  . COLONOSCOPY WITH PROPOFOL N/A 08/15/2016   Procedure: COLONOSCOPY WITH PROPOFOL;  Surgeon: [name], MD;  Location: [facility] ENDOSCOPY;  Service: Endoscopy;  Laterality: N/A;  . CORONARY ANGIOPLASTY  2009   2005; s/p stent  . ESOPHAGOGASTRODUODENOSCOPY (EGD) WITH PROPOFOL N/A 08/15/2016   Procedure: ESOPHAGOGASTRODUODENOSCOPY (EGD) WITH PROPOFOL;  Surgeon: [name], MD;  Location: [facility] ENDOSCOPY;  Service: Endoscopy;  Laterality: N/A;  . GIVENS CAPSULE STUDY N/A 09/26/2016   Procedure: GIVENS CAPSULE STUDY;  Surgeon: [name], MD;  Location: [facility] ENDOSCOPY;  Service: Endoscopy;  Laterality: N/A;  . HERNIA REPAIR  2013  . LUMBAR SPINE SURGERY    . UPPER GI ENDOSCOPY  Sept 2015   Dr Rayann Heman    Prior to Admission medications   Medication Sig Start Date End Date Taking? Authorizing Provider  amiodarone (PACERONE) 200 MG tablet TAKE 1 TABLET BY MOUTH  DAILY AS NEEDED 04/14/18   [name], MD  cyproheptadine (PERIACTIN) 4 MG tablet Take 1 tablet (4 mg total) by mouth  3 (three) times daily as needed for allergies. 02/17/18   [name], MD  diltiazem (CARDIZEM CD) 120 MG 24 hr capsule TAKE 1 CAPSULE BY MOUTH  DAILY 04/15/18   [name], MD  esomeprazole (NEXIUM) 40 MG capsule TAKE 1 CAPSULE BY MOUTH  DAILY AT 12 NOON 08/01/17   [name], MD  fenofibrate micronized (ANTARA) 130 MG capsule TAKE 1 CAPSULE BY MOUTH  DAILY BEFORE BREAKFAST 01/08/18   [name], MD  ferrous sulfate 325 (65 FE) MG tablet Take 1 tablet (325 mg total) by mouth 2 (two) times daily with a meal. Patient not  taking: Reported on 03/05/2018 10/17/16 02/16/18  [name], MD  finasteride (PROSCAR) 5 MG tablet Take 1 tablet (5 mg total) by mouth daily. 02/05/18   Stoioff, Ronda Fairly, MD  levobunolol (BETAGAN) 0.5 % ophthalmic solution Place 1 drop into the right eye 2 (two) times daily.     [provider]  lisinopril (PRINIVIL,ZESTRIL) 2.5 MG tablet Take 2.5 mg by mouth at bedtime.     [provider]  metoprolol tartrate (LOPRESSOR) 25 MG tablet Take 1 tablet (25 mg total) by mouth 2 (two) times daily. 10/21/17   [name], MD  nitroGLYCERIN (NITROSTAT) 0.4 MG SL tablet DISSOLVE UNDER THE TONGUE 1 TABLET EVERY 5 MINUTES AS NEEDED FOR CHEST PAIN 06/11/16   [name], MD  Omega-3 Fatty Acids (FISH OIL) 1000 MG CAPS Take 1,000 mg by mouth daily.    [provider]  polyethylene glycol powder (GLYCOLAX/MIRALAX) powder Take 17 g by mouth 2 (two) times daily as needed. 06/05/18   [name], MD  pravastatin (PRAVACHOL) 40 MG tablet TAKE 1 TABLET BY MOUTH AT  BEDTIME 06/08/18   [name], MD  tamsulosin (FLOMAX) 0.4 MG CAPS capsule Take 1 capsule (0.4 mg total) by mouth daily. 06/15/18   Stoioff, Ronda Fairly, MD  XARELTO 20 MG TABS tablet TAKE 1 TABLET DAILY 06/12/18   [name], MD    Allergies Bee venom; Sulfasalazine; Sulfa antibiotics; and Warfarin and related  Family History  Problem Relation Age of Onset  . Stroke Mother   . Colon cancer Father   . Coronary artery disease Brother   . Other Brother 71       lymes dz    Social History Social History   Tobacco Use  . Smoking status: Never Smoker  . Smokeless tobacco: Never Used  Substance Use Topics  . Alcohol use: No  . Drug use: No    Review of Systems  Constitutional: No fever. Eyes: No redness. ENT: No neck pain. Cardiovascular: Positive for resolved chest pain. Respiratory: Denies shortness of breath. Gastrointestinal: No nausea.  Genitourinary: Negative for flank pain.    Musculoskeletal: Negative for back pain. Skin: Negative for rash. Neurological: Negative for headache.   ____________________________________________   PHYSICAL EXAM:  VITAL SIGNS: ED Triage Vitals  Enc Vitals Group     BP 07/01/18 1412 (!) 183/93     Pulse Rate 07/01/18 1412 (!) 57     Resp 07/01/18 1412 11     Temp --      Temp src --      SpO2 07/01/18 1407 97 %     Weight 07/01/18 1409 218 lb 4.1 oz (99 kg)     Height 07/01/18 1409 6\' 4"  (1.93 m)     Head Circumference --      Peak Flow --  Pain Score 07/01/18 1409 0     Pain Loc --      Pain Edu? --      Excl. in [place]? --     Constitutional: Alert and oriented.  Relatively well appearing and in no acute distress. Eyes: Conjunctivae are normal.  Head: Atraumatic. Nose: No congestion/rhinnorhea. Mouth/Throat: Mucous membranes are moist.   Neck: Normal range of motion.  Cardiovascular: Normal rate, regular rhythm. Grossly normal heart sounds.  Good peripheral circulation. Respiratory: Normal respiratory effort.  No retractions. Lungs CTAB. Gastrointestinal: No distention.  Musculoskeletal: No lower extremity edema.  Extremities warm and well perfused.  Neurologic:  Normal speech and language. No gross focal neurologic deficits are appreciated.  Skin:  Skin is warm and dry. No rash noted. Psychiatric: Mood and affect are normal. Speech and behavior are normal.  ____________________________________________   LABS (all labs ordered are listed, but only abnormal results are displayed)  Labs Reviewed  BASIC METABOLIC PANEL - Abnormal; Notable for the following components:      Result Value   Potassium 5.2 (*)    Glucose, Bld 105 (*)    BUN 32 (*)    Creatinine, Ser 1.29 (*)    GFR calc non Af Amer 48 (*)    GFR calc Af Amer 56 (*)    All other components within normal limits  CBC - Abnormal; Notable for the following components:   RBC 3.09 (*)    Hemoglobin 10.6 (*)    HCT 31.3 (*)    MCV 101.3 (*)     MCH 34.3 (*)    All other components within normal limits  TROPONIN I  TROPONIN I   ____________________________________________  EKG  ED ECG REPORT I, Arta Silence, the attending physician, personally viewed and interpreted this ECG.  Date: 07/01/2018 EKG Time: 1404 Rate: 58 Rhythm: normal sinus rhythm QRS Axis: normal Intervals: normal ST/T Wave abnormalities: Nonspecific anterolateral T wave flattening Narrative Interpretation: no evidence of acute ischemia; no significant change when compared to EKG of 09/15/2017   ED ECG REPORT I, Arta Silence, the attending physician, personally viewed and interpreted this ECG.  Date: 07/01/2018 EKG Time: 1532 Rate: 55 Rhythm: normal sinus rhythm QRS Axis: normal Intervals: normal ST/T Wave abnormalities: Nonspecific anterolateral T wave flattening Narrative Interpretation: No dynamic change when compared to EKG of 1404 today     ____________________________________________  RADIOLOGY  CXR: No focal infiltrate or other acute abnormality  ____________________________________________   PROCEDURES  Procedure(s) performed: No  Procedures  Critical Care performed: No ____________________________________________   INITIAL IMPRESSION / ASSESSMENT AND PLAN / ED COURSE  Pertinent labs & imaging results that were available during my care of the patient were reviewed by me and considered in my medical decision making (see chart for details).  This 84 year old male with PMH as noted above including prior history of MI presents with atypical nonexertional chest pain starting in his left chest and radiating to his epigastric area, resolving after 20 minutes.  On exam he is well-appearing and his vital signs are normal.  His EKG shows no ischemic findings.  I reviewed the past medical records in Epic; the patient has no recent ED visits or admissions.  Overall given that the pain was brief, nonexertional, and  atypical in location and nature it is unlikely ACS.  I suspect most likely GI cause.  Since the pain resolved there is no clinical evidence for vascular etiology or PE.  The patient feels comfortable and has been asymptomatic while  in the ED.  We will plan for troponins x2, chest x-ray, basic labs.  If the patient remains pain-free and has negative enzymes I anticipate discharge home.  ----------------------------------------- 3:33 PM on 07/01/2018 -----------------------------------------  The patient has remained pain-free.  His first enzyme is negative.  We will plan for repeat after 3 hours.  I am signing the patient out to the oncoming physician Dr. Archie Balboa. ____________________________________________   FINAL CLINICAL IMPRESSION(S) / ED DIAGNOSES  Final diagnoses:  Atypical chest pain      NEW MEDICATIONS STARTED DURING THIS VISIT:  New Prescriptions   No medications on file     Note:  This document was prepared using Dragon voice recognition software and may include unintentional dictation errors.    Arta Silence, MD 07/01/18 [phone]

## 2018-07-01 NOTE — Discharge Instructions (Signed)
Call Dr. Donivan Scull office tomorrow to arrange for follow-up within the next week.  Return to the ER for new, worsening, persistent severe chest pain, difficulty breathing, weakness, or any other new or worsening symptoms that concern you.

## 2018-07-07 ENCOUNTER — Ambulatory Visit
Admission: RE | Admit: 2018-07-07 | Discharge: 2018-07-07 | Disposition: A | Discharge status: Home / Self Care | Payer: Medicare HMO | Source: Ambulatory Visit | Attending: Oncology | Admitting: Oncology

## 2018-07-07 DIAGNOSIS — D591 Other autoimmune hemolytic anemias: Secondary | ICD-10-CM | POA: Insufficient documentation

## 2018-07-07 DIAGNOSIS — N183 Chronic kidney disease, stage 3 (moderate): Secondary | ICD-10-CM | POA: Diagnosis not present

## 2018-07-07 DIAGNOSIS — M1712 Unilateral primary osteoarthritis, left knee: Secondary | ICD-10-CM | POA: Diagnosis not present

## 2018-07-07 DIAGNOSIS — N400 Enlarged prostate without lower urinary tract symptoms: Secondary | ICD-10-CM | POA: Insufficient documentation

## 2018-07-07 DIAGNOSIS — C61 Malignant neoplasm of prostate: Secondary | ICD-10-CM | POA: Diagnosis not present

## 2018-07-07 DIAGNOSIS — R911 Solitary pulmonary nodule: Secondary | ICD-10-CM | POA: Insufficient documentation

## 2018-07-07 DIAGNOSIS — D5912 Cold autoimmune hemolytic anemia: Secondary | ICD-10-CM

## 2018-07-09 ENCOUNTER — Inpatient Hospital Stay (HOSPITAL_BASED_OUTPATIENT_CLINIC_OR_DEPARTMENT_OTHER): Payer: Medicare HMO | Admitting: Oncology

## 2018-07-09 ENCOUNTER — Encounter: Payer: Self-pay | Admitting: Oncology

## 2018-07-09 VITALS — BP 129/75 | HR 59 | Temp 97.7°F | Resp 18 | Ht 76.0 in | Wt 221.2 lb

## 2018-07-09 DIAGNOSIS — D591 Other autoimmune hemolytic anemias: Secondary | ICD-10-CM | POA: Diagnosis not present

## 2018-07-09 DIAGNOSIS — Z79899 Other long term (current) drug therapy: Secondary | ICD-10-CM

## 2018-07-09 DIAGNOSIS — I10 Essential (primary) hypertension: Secondary | ICD-10-CM

## 2018-07-09 DIAGNOSIS — D5912 Cold autoimmune hemolytic anemia: Secondary | ICD-10-CM

## 2018-07-09 DIAGNOSIS — I252 Old myocardial infarction: Secondary | ICD-10-CM | POA: Diagnosis not present

## 2018-07-09 DIAGNOSIS — D7589 Other specified diseases of blood and blood-forming organs: Secondary | ICD-10-CM

## 2018-07-09 DIAGNOSIS — I480 Paroxysmal atrial fibrillation: Secondary | ICD-10-CM

## 2018-07-09 DIAGNOSIS — Z85828 Personal history of other malignant neoplasm of skin: Secondary | ICD-10-CM

## 2018-07-09 DIAGNOSIS — Z7901 Long term (current) use of anticoagulants: Secondary | ICD-10-CM

## 2018-07-09 DIAGNOSIS — Z8546 Personal history of malignant neoplasm of prostate: Secondary | ICD-10-CM

## 2018-07-09 NOTE — Progress Notes (Signed)
No new changes noted today 

## 2018-07-10 NOTE — Progress Notes (Signed)
Hematology/Oncology Consult note [facility]  [phone] [phone]  Patient Care Team: [name], MD as PCP - General (Family Medicine) Bary Castilla, Forest Gleason, MD as Consulting Physician (General Surgery) [name], MD as Referring Physician (Internal Medicine) [name], MD as Consulting Physician (Cardiology)   Name of the patient: William Taylor  [id number]  May 24, 1930   Date of visit: 07/10/18  Diagnosis- cold agglutinin hemolytic anemia  Chief complaint/ Reason for visit-routine follow-up of cold agglutinin hemolytic anemia and discuss results of CT scan  Heme/Onc history: patient is a 84 year old male with a past medical history significant for prostate cancer for which he follows up with urology. He also has chronic back pain,hypertension and stage III CKD hyperlipidemia and A. fib among other medical problems. He has been referred to [place] for evaluation and management of anemia. Recent CBC from 02/16/2018 showed white count of 6.1, H&H of 10.7/30.8 with an MCV of 99.9. He was found to have coldagglutinin inhis peripheral blood. TSH was within normal limits.B12 and folate were within normal limits.His hemoglobin was 10.7 about 4 months ago but about 1 year ago his hemoglobin was 12.9.  Patient reports ongoing fatigue over the last 1 year. His appetite is good and his weight has been stable. He denies any lumps or bumps anywhere or drenching night sweats. Patient does report ongoing left-sided groin pain. He did have an ultrasound which did not reveal any adenopathy or mass. Also reports chronic back pain but denies any other acute joint pain or joint swelling. No skin rash.  Results of blood work from 03/05/2018 were as follows: CBC showed white count of 6.5, H&H of 11.9/35 with an MCV of 101 and a platelet count of 228. CMP was normal except for a mildly elevated creatinine of 1.3. LDH was normal at  122 reticulocyte count was low normal at 1.9. Multiple myeloma panel did not reveal any M protein serum immunofixation was normal. Free light chain showed mildly elevated kappa of 23.8 with a normal kappa lambda light chain ratio of 1.5. Haptoglobin was low at 21 cold agglutinin titer was positive at 1 is to 1024 Coombs test was positive for complement and negative for IgG smear review showed macrocytic anemia with rouleaux formation. Negative for schistocytes iron studies were within normal limits  CT chest abdomen and pelvis in November 2019 showed nonspecific 4 mm left lower lobe lung nodule.  Prostatomegaly.  No evidence of malignancy  Interval history-patient continues to feel fatigued but denies other complaints.  His appetite is fair and his weight is stable.  ECOG PS- 2 Pain scale- 0 Opioid associated constipation- no  Review of systems- Review of Systems  Constitutional: Positive for malaise/fatigue. Negative for chills, fever and weight loss.  HENT: Negative for congestion, ear discharge and nosebleeds.   Eyes: Negative for blurred vision.  Respiratory: Negative for cough, hemoptysis, sputum production, shortness of breath and wheezing.   Cardiovascular: Negative for chest pain, palpitations, orthopnea and claudication.  Gastrointestinal: Negative for abdominal pain, blood in stool, constipation, diarrhea, heartburn, melena, nausea and vomiting.  Genitourinary: Negative for dysuria, flank pain, frequency, hematuria and urgency.  Musculoskeletal: Negative for back pain, joint pain and myalgias.  Skin: Negative for rash.  Neurological: Negative for dizziness, tingling, focal weakness, seizures, weakness and headaches.  Endo/Heme/Allergies: Does not bruise/bleed easily.  Psychiatric/Behavioral: Negative for depression and suicidal ideas. The patient does not have insomnia.       Allergies  Allergen  Reactions  . Bee Venom Swelling  . Sulfasalazine Hives and Swelling  . Sulfa  Antibiotics Hives and Swelling  . Warfarin And Related Other (See Comments)    Chest pain     Past Medical History:  Diagnosis Date  . Cancer [facility])    Prostate, followed by Dr. Jacqlyn Larsen  . Colon polyp   . Coronary artery disease   . Hyperlipidemia   . Hypertension   . MI (myocardial infarction) ([place])    2015  . PAF (paroxysmal atrial fibrillation) ([place])    a. on xarelto  . Skin cancer      Past Surgical History:  Procedure Laterality Date  . CARDIAC CATHETERIZATION  10/2013   [facility]  . CATARACT EXTRACTION  Oct. 3, 2012   right eye  . colonoscopy    . COLONOSCOPY W/ POLYPECTOMY  2015   Dr Rayann Heman  . COLONOSCOPY WITH PROPOFOL N/A 08/15/2016   Procedure: COLONOSCOPY WITH PROPOFOL;  Surgeon: [name], MD;  Location: [facility] ENDOSCOPY;  Service: Endoscopy;  Laterality: N/A;  . CORONARY ANGIOPLASTY  2009   2005; s/p stent  . ESOPHAGOGASTRODUODENOSCOPY (EGD) WITH PROPOFOL N/A 08/15/2016   Procedure: ESOPHAGOGASTRODUODENOSCOPY (EGD) WITH PROPOFOL;  Surgeon: [name], MD;  Location: [facility] ENDOSCOPY;  Service: Endoscopy;  Laterality: N/A;  . GIVENS CAPSULE STUDY N/A 09/26/2016   Procedure: GIVENS CAPSULE STUDY;  Surgeon: [name], MD;  Location: [facility] ENDOSCOPY;  Service: Endoscopy;  Laterality: N/A;  . HERNIA REPAIR  2013  . LUMBAR SPINE SURGERY    . UPPER GI ENDOSCOPY  Sept 2015   Dr Rayann Heman    Social History   Socioeconomic History  . Marital status: Married    Spouse name: Not on file  . Number of children: Not on file  . Years of education: Not on file  . Highest education level: Not on file  Occupational History  . Not on file  Social Needs  . Financial resource strain: Not on file  . Food insecurity:    Worry: Not on file    Inability: Not on file  . Transportation needs:    Medical: Not on file    Non-medical: Not on file  Tobacco Use  . Smoking status: Never Smoker  . Smokeless tobacco: Never Used  Substance and Sexual Activity  . Alcohol use: No  . Drug use: No    . Sexual activity: Not Currently  Lifestyle  . Physical activity:    Days per week: Not on file    Minutes per session: Not on file  . Stress: Not on file  Relationships  . Social connections:    Talks on phone: Not on file    Gets together: Not on file    Attends religious service: Not on file    Active member of club or organization: Not on file    Attends meetings of clubs or organizations: Not on file    Relationship status: Not on file  . Intimate partner violence:    Fear of current or ex partner: Not on file    Emotionally abused: Not on file    Physically abused: Not on file    Forced sexual activity: Not on file  Other Topics Concern  . Not on file  Social History Narrative  . Not on file    Family History  Problem Relation Age of Onset  . Stroke Mother   . Colon cancer Father   . Coronary artery disease Brother   . Other Brother 4  lymes dz     Current Outpatient Medications:  .  diltiazem (CARDIZEM CD) 120 MG 24 hr capsule, TAKE 1 CAPSULE BY MOUTH  DAILY, Disp: 90 capsule, Rfl: 2 .  esomeprazole (NEXIUM) 40 MG capsule, TAKE 1 CAPSULE BY MOUTH  DAILY AT 12 NOON, Disp: 90 capsule, Rfl: 3 .  fenofibrate micronized (ANTARA) 130 MG capsule, TAKE 1 CAPSULE BY MOUTH  DAILY BEFORE BREAKFAST, Disp: 90 capsule, Rfl: 1 .  finasteride (PROSCAR) 5 MG tablet, Take 1 tablet (5 mg total) by mouth daily., Disp: 90 tablet, Rfl: 2 .  levobunolol (BETAGAN) 0.5 % ophthalmic solution, Place 1 drop into the right eye 2 (two) times daily. , Disp: , Rfl:  .  lisinopril (PRINIVIL,ZESTRIL) 2.5 MG tablet, Take 2.5 mg by mouth at bedtime. , Disp: , Rfl:  .  metoprolol tartrate (LOPRESSOR) 25 MG tablet, Take 1 tablet (25 mg total) by mouth 2 (two) times daily., Disp: 180 tablet, Rfl: 3 .  Omega-3 Fatty Acids (FISH OIL) 1000 MG CAPS, Take 1,000 mg by mouth daily., Disp: , Rfl:  .  pravastatin (PRAVACHOL) 40 MG tablet, TAKE 1 TABLET BY MOUTH AT  BEDTIME, Disp: 90 tablet, Rfl: 3 .   tamsulosin (FLOMAX) 0.4 MG CAPS capsule, Take 1 capsule (0.4 mg total) by mouth daily., Disp: 30 capsule, Rfl: 11 .  XARELTO 20 MG TABS tablet, TAKE 1 TABLET DAILY, Disp: 90 tablet, Rfl: 1 .  amiodarone (PACERONE) 200 MG tablet, TAKE 1 TABLET BY MOUTH  DAILY AS NEEDED (Patient not taking: Reported on 07/09/2018), Disp: 90 tablet, Rfl: 2 .  cyproheptadine (PERIACTIN) 4 MG tablet, Take 1 tablet (4 mg total) by mouth 3 (three) times daily as needed for allergies. (Patient not taking: Reported on 07/09/2018), Disp: 90 tablet, Rfl: 3 .  ferrous sulfate 325 (65 FE) MG tablet, Take 1 tablet (325 mg total) by mouth 2 (two) times daily with a meal. (Patient not taking: Reported on 03/05/2018), Disp: 124 tablet, Rfl: 2 .  nitroGLYCERIN (NITROSTAT) 0.4 MG SL tablet, DISSOLVE UNDER THE TONGUE 1 TABLET EVERY 5 MINUTES AS NEEDED FOR CHEST PAIN (Patient not taking: Reported on 07/09/2018), Disp: 25 tablet, Rfl: 3 .  polyethylene glycol powder (GLYCOLAX/MIRALAX) powder, Take 17 g by mouth 2 (two) times daily as needed. (Patient not taking: Reported on 07/09/2018), Disp: 3350 g, Rfl: 1  Physical exam:  Vitals:   07/09/18 1035  BP: 129/75  Pulse: (!) 59  Resp: 18  Temp: 97.7 F (36.5 C)  SpO2: 98%  Weight: 221 lb 3.2 oz (100.3 kg)  Height: '6\' 4"'$  (1.93 m)   Physical Exam  Constitutional: He is oriented to person, place, and time. He appears well-developed and well-nourished.  Elderly gentleman in no acute distress  HENT:  Head: Normocephalic and atraumatic.  Eyes: Pupils are equal, round, and reactive to light. EOM are normal.  Neck: Normal range of motion.  Cardiovascular: Normal rate, regular rhythm and normal heart sounds.  Pulmonary/Chest: Effort normal and breath sounds normal.  Abdominal: Soft. Bowel sounds are normal.  Neurological: He is alert and oriented to person, place, and time.  Skin: Skin is warm and dry.     CMP Latest Ref Rng & Units 07/01/2018  Glucose 70 - 99 mg/dL 105(H)  BUN 8 -  23 mg/dL 32(H)  Creatinine 0.61 - 1.24 mg/dL 1.29(H)  Sodium 135 - 145 mmol/L 137  Potassium 3.5 - 5.1 mmol/L 5.2(H)  Chloride 98 - 111 mmol/L 105  CO2 22 - 32 mmol/L  25  Calcium 8.9 - 10.3 mg/dL 9.3  Total Protein 6.5 - 8.1 g/dL -  Total Bilirubin 0.3 - 1.2 mg/dL -  Alkaline Phos 38 - 126 U/L -  AST 15 - 41 U/L -  ALT 0 - 44 U/L -   CBC Latest Ref Rng & Units 07/01/2018  WBC 4.0 - 10.5 K/uL 9.2  Hemoglobin 13.0 - 17.0 g/dL 10.6(L)  Hematocrit 39.0 - 52.0 % 31.3(L)  Platelets 150 - 400 K/uL 226    No images are attached to the encounter.  Ct Abdomen Pelvis Wo Contrast  Result Date: 07/07/2018 CLINICAL DATA:  Cold agglutinin hemolytic anemia, evaluate for possible malignancy. History of prostate cancer. Chronic back pain. Abdominal pain. Stage 3 chronic kidney disease. EXAM: CT CHEST, ABDOMEN AND PELVIS WITHOUT CONTRAST TECHNIQUE: Multidetector CT imaging of the chest, abdomen and pelvis was performed following the standard protocol without IV contrast. COMPARISON:  None. FINDINGS: CT CHEST FINDINGS Cardiovascular: Heart is normal in size.  No pericardial effusion. No evidence of thoracic aortic aneurysm. Atherosclerotic calcifications of the aortic arch. Coronary atherosclerosis of the LAD and left circumflex. Mediastinum/Nodes: No suspicious mediastinal or axillary lymphadenopathy. Hilar regions are suboptimally evaluated in the absence of intravenous contrast. Visualized thyroid is notable for a 2.3 cm nodule, of questionable significance given the patient's age. Lungs/Pleura: Mild biapical pleural-parenchymal scarring. 4 mm left lower lobe pulmonary nodule (series 3/image 101). Mild subpleural reticulation in the left lower lobe. No focal consolidation. No pleural effusion or pneumothorax. Musculoskeletal: Mild degenerative changes the thoracic spine. CT ABDOMEN PELVIS FINDINGS Hepatobiliary: Unenhanced liver is unremarkable. Layering small gallstones (series 2/image 32), without  associated inflammatory changes. Pancreas: Very mild parenchymal calcifications. Spleen: Calcified splenic granuloma. Adrenals/Urinary Tract: Adrenal glands are within normal limits. Kidneys are within normal limits. No renal, ureteral, or bladder calculi. No hydronephrosis. Bladder is within normal limits. Stomach/Bowel: Stomach is notable for a small hiatal hernia. No evidence of bowel obstruction. Normal appendix (series 2/image 100). Mild lipomatosis of the ileocecal valve (series 2/image 91). Extensive colonic diverticulosis, without evidence of diverticulitis. Vascular/Lymphatic: No evidence of abdominal aortic aneurysm. Atherosclerotic calcifications of the abdominal aorta and branch vessels. No suspicious abdominopelvic lymphadenopathy. Reproductive: Mild prostatomegaly in this patient with known prostate cancer. Other: No abdominopelvic ascites. Mild fat within the left inguinal canal. Musculoskeletal: Mild degenerative changes of the lumbar spine. No focal osseous lesions. IMPRESSION: Mild prostatomegaly in this patient with known prostate cancer. No findings specific for metastatic disease. 4 mm left lower lobe pulmonary nodule. This is not considered suspicious for solitary metastasis. As such, Fleischner recommendations will be provided. No follow-up needed if patient is low-risk. Non-contrast chest CT can be considered in 12 months if patient is high-risk. This recommendation follows the consensus statement: Guidelines for Management of Incidental Pulmonary Nodules Detected on CT Images: From the Fleischner Society 2017; Radiology 2017; 284:228-243. Otherwise, no findings suspicious for malignancy in this patient with cold agglutinin hemolytic anemia. Electronically Signed   By: Julian Hy M.D.   On: 07/07/2018 13:28   Dg Chest 2 View  Result Date: 07/01/2018 CLINICAL DATA:  Left chest pain radiating to the jaw off since 12/30 this afternoon. EXAM: CHEST - 2 VIEW COMPARISON:  02/16/2018  FINDINGS: Cardiac silhouette is normal in size and configuration. Stable left coronary artery stent. No mediastinal or hilar masses. No evidence of adenopathy. Lungs are hyperexpanded but clear. No pleural effusion or pneumothorax. Skeletal structures are intact. IMPRESSION: No active cardiopulmonary disease. Electronically Signed   By: Shanon Brow  Ormond M.D.   On: 07/01/2018 14:39   Ct Chest Wo Contrast  Result Date: 07/07/2018 CLINICAL DATA:  Cold agglutinin hemolytic anemia, evaluate for possible malignancy. History of prostate cancer. Chronic back pain. Abdominal pain. Stage 3 chronic kidney disease. EXAM: CT CHEST, ABDOMEN AND PELVIS WITHOUT CONTRAST TECHNIQUE: Multidetector CT imaging of the chest, abdomen and pelvis was performed following the standard protocol without IV contrast. COMPARISON:  None. FINDINGS: CT CHEST FINDINGS Cardiovascular: Heart is normal in size.  No pericardial effusion. No evidence of thoracic aortic aneurysm. Atherosclerotic calcifications of the aortic arch. Coronary atherosclerosis of the LAD and left circumflex. Mediastinum/Nodes: No suspicious mediastinal or axillary lymphadenopathy. Hilar regions are suboptimally evaluated in the absence of intravenous contrast. Visualized thyroid is notable for a 2.3 cm nodule, of questionable significance given the patient's age. Lungs/Pleura: Mild biapical pleural-parenchymal scarring. 4 mm left lower lobe pulmonary nodule (series 3/image 101). Mild subpleural reticulation in the left lower lobe. No focal consolidation. No pleural effusion or pneumothorax. Musculoskeletal: Mild degenerative changes the thoracic spine. CT ABDOMEN PELVIS FINDINGS Hepatobiliary: Unenhanced liver is unremarkable. Layering small gallstones (series 2/image 32), without associated inflammatory changes. Pancreas: Very mild parenchymal calcifications. Spleen: Calcified splenic granuloma. Adrenals/Urinary Tract: Adrenal glands are within normal limits. Kidneys are  within normal limits. No renal, ureteral, or bladder calculi. No hydronephrosis. Bladder is within normal limits. Stomach/Bowel: Stomach is notable for a small hiatal hernia. No evidence of bowel obstruction. Normal appendix (series 2/image 100). Mild lipomatosis of the ileocecal valve (series 2/image 91). Extensive colonic diverticulosis, without evidence of diverticulitis. Vascular/Lymphatic: No evidence of abdominal aortic aneurysm. Atherosclerotic calcifications of the abdominal aorta and branch vessels. No suspicious abdominopelvic lymphadenopathy. Reproductive: Mild prostatomegaly in this patient with known prostate cancer. Other: No abdominopelvic ascites. Mild fat within the left inguinal canal. Musculoskeletal: Mild degenerative changes of the lumbar spine. No focal osseous lesions. IMPRESSION: Mild prostatomegaly in this patient with known prostate cancer. No findings specific for metastatic disease. 4 mm left lower lobe pulmonary nodule. This is not considered suspicious for solitary metastasis. As such, Fleischner recommendations will be provided. No follow-up needed if patient is low-risk. Non-contrast chest CT can be considered in 12 months if patient is high-risk. This recommendation follows the consensus statement: Guidelines for Management of Incidental Pulmonary Nodules Detected on CT Images: From the Fleischner Society 2017; Radiology 2017; 284:228-243. Otherwise, no findings suspicious for malignancy in this patient with cold agglutinin hemolytic anemia. Electronically Signed   By: Julian Hy M.D.   On: 07/07/2018 13:28   US Scrotum W/doppler  Result Date: 06/17/2018 CLINICAL DATA:  Initial evaluation for chronic left testicular pain. EXAM: SCROTAL ULTRASOUND DOPPLER ULTRASOUND OF THE TESTICLES TECHNIQUE: Complete ultrasound examination of the testicles, epididymis, and other scrotal structures was performed. Color and spectral Doppler ultrasound were also utilized to evaluate blood  flow to the testicles. COMPARISON:  None. FINDINGS: Right testicle Measurements: 3.4 x 2.0 x 2.8 cm. Right testicle demonstrates a heterogeneous echotexture without discrete mass. No overt microlithiasis. Left testicle Measurements: 3.5 x 2.1 x 2.3 cm. Left testicle demonstrates a heterogeneous echotexture without discrete mass. No overt microlithiasis. Right epididymis:  Normal in size and appearance. Left epididymis: Left epididymis poorly visualized on this exam. No obvious abnormality identified. Hydrocele: Small simple bilateral hydroceles noted, of doubtful significance. Varicocele:  None visualized. Pulsed Doppler interrogation of both testes demonstrates normal low resistance arterial and venous waveforms bilaterally. Small mobile fat containing hernia noted within the right inguinal canal. IMPRESSION: 1. No acute abnormality  identified about the scrotum or testes. No discrete testicular mass identified. 2. Small fat containing right inguinal hernia. 3. Small simple bilateral hydroceles. Electronically Signed   By: Jeannine Boga M.D.   On: 06/17/2018 23:00     Assessment and plan- Patient is a 84 y.o. male with cold agglutinin hemolytic anemia.  He is under observation and has not required any treatment so far and is here for routine follow-up and to discuss the results of his CT scan  I have reviewed CT chest abdomen pelvis images independently and discussed findings with the patient.  He does not have any evidence of malignancy that would explain the cause of his cold agglutinin hemolytic anemia.  He continues to remain anemic for the last 7 to 8 months but his hemoglobin has remained stable around 10.  Prior to that his hemoglobin was about 12.  Labs are suggestive of cold agglutinin hemolytic anemia.  Discussed at this point there are 2 options: We could try weekly Rituxan x4 to see if his hemoglobin improves mainly to see if that would palliate his fatigue if his hemoglobin improves.   However Rituxan is associated with possible side effects such as increased risk of infection, infusion reactions and neutropenia.  The second option would be to continue observation given that his hemoglobin has remained stable around 10 for the last 8 months and it is unclear if his fatigue is because of his moderate anemia.  For now we will continue observation and repeat CBC with differential, CMP, reticulocyte count and haptoglobin in 3 weeks in 6 weeks and I will see him back in 6 weeks     Visit Diagnosis 1. Cold agglutinin disease ([place])      Dr. Randa Evens, MD, MPH [facility] at [facility] [id number] 07/10/2018 10:19 AM

## 2018-07-15 ENCOUNTER — Other Ambulatory Visit: Payer: Self-pay | Admitting: Cardiovascular Disease

## 2018-07-18 NOTE — Progress Notes (Signed)
Cardiology Office Note Date:  07/21/2018  Patient ID:  William, Taylor [birth year], MRN [id number] PCP:  William Haven, MD  Cardiologist:  Dr. Rockey Situ, MD    Chief Complaint: ED follow up  History of Present Illness: William Taylor is a 84 y.o. male with history of CAD s/p LAD stenting in 2005 s/p PCI to the [place] in 2012, PAF on Xarelto, PE in 2006 while in living [place], PE following catheterization in 2012, atypical chest pain, CKD stage III, cold agglutinin hemolytic anemia, prostate cancer, orthostatic hypotension, HTN, and HLD who presents for ED follow up of chest pain.   Patient previously underwent stenting to the LAD in 2005. LHC in 2009 showed a patent LAD stent without other significant disease. He was diagnosed with Afib in 04/2010 in the setting of a UTI. He has had recurrent Afib in at least 2012, 2013, and 2018. Over the years he has been anticoagulated with Xarelto and been on metoprolol as well as amiodarone with medications previously being held secondary to bradycardia. Repeat cardiac cath in 2012 showed 90% OM2 stenosis and underwent PCI/DES at [facility]. Following the cath, he had flank pain and was diagnosed with a PE with heavy clot burden. Most recent cardiac cath from 10/2013 showed mid LAD-1 lesion 10% ISR, mid LAD-2 lesion 40%, D1-1 lesion 90%, D1-2 lesion 70%, OM1 80%, OM2 stent patent without residual restenosis. Medical management was advised. Most recent ischemic evaluation from 09/2017 via nuclear stress testing showed no significant ischemia, EF 69%, low risk scan. Most recent echo from 02/2017 showed an EF of 55-60%, mild to moderate LVH, no RWMA, Gr1DD, mildly dilated aortic root at 40 mm, no AI, moderately dilated left atrium.   Patient was seen in the ED on 07/01/2018 with nonexertional chest pain that lasted ~ 20 minutes and resolved. Pain was described as starting on the left lateral side of his chest and radiated to the left upper quadrant and epigastric region of  his abdomen. BP noted to be elevated at 183/93. Troponin negative x 3. WBC 9.2, HGB 10.6, PLT 226. Potassium 5.2 with hemolysis, SCr 1.29. CXR not acute. EKG is not in Epic for cardiology review. He was discharged to outpatient follow up.   He has since been seen by hematology for cold agglutinin hemolytic anemia. CT chest/abdomen/pelvis on 07/07/18 showed mild prostatomegaly with no findings specific for metastatic disease and a 4 mm left lower lobe pulmonary nodule.  Patient comes in accompanied by his wife feeling well today.  He notes continued exertional dyspnea that dates back to early 2019 in which she underwent nuclear stress testing and 09/2017 as detailed above.  He began to notice a left-sided substernal chest pain that radiated down the left axillary line down into the left upper quadrant and left lower quadrant of his abdomen in mid 06/2018 that was initially persistent.  This prompted him to be evaluated in the ED as above.  Upon his presentation to the ED on 11/13 he continued to be symptomatic though his symptoms were improved.  Following his discharge from the ED he noted some improvement in his symptoms until the day of Thanksgiving when he had worsening left upper quadrant, left lower quadrant, and left axillary chest wall pain.  It seems like range of motion reproduced these symptoms to him.  However, over the past 2 to 3 days he has had no further symptoms and feels well at this time.  He is uncertain if the symptoms feel  similar to how he felt leading up to his prior PCI.  He does indicate the symptoms may feel similar to how he felt when he was diagnosed with a PE as above.  However, the patient has been maintained on Xarelto in the setting of his underlying A. fib and reports complete compliance with this medication and denies missing any doses.  Pulse ox at home has been in the 98% range on room air.  He is currently asymptomatic.  Past Medical History:  Diagnosis Date  . Cancer [facility])     Prostate, followed by Dr. Jacqlyn Taylor  . Colon polyp   . Coronary artery disease   . Hyperlipidemia   . Hypertension   . MI (myocardial infarction) ([place])    2015  . PAF (paroxysmal atrial fibrillation) ([place])    a. on xarelto  . Skin cancer     Past Surgical History:  Procedure Laterality Date  . CARDIAC CATHETERIZATION  10/2013   [facility]  . CATARACT EXTRACTION  Oct. 3, 2012   right eye  . colonoscopy    . COLONOSCOPY W/ POLYPECTOMY  2015   Dr William Taylor  . COLONOSCOPY WITH PROPOFOL N/A 08/15/2016   Procedure: COLONOSCOPY WITH PROPOFOL;  Surgeon: William Bellows, MD;  Location: [facility] ENDOSCOPY;  Service: Endoscopy;  Laterality: N/A;  . CORONARY ANGIOPLASTY  2009   2005; s/p stent  . ESOPHAGOGASTRODUODENOSCOPY (EGD) WITH PROPOFOL N/A 08/15/2016   Procedure: ESOPHAGOGASTRODUODENOSCOPY (EGD) WITH PROPOFOL;  Surgeon: William Bellows, MD;  Location: [facility] ENDOSCOPY;  Service: Endoscopy;  Laterality: N/A;  . GIVENS CAPSULE STUDY N/A 09/26/2016   Procedure: GIVENS CAPSULE STUDY;  Surgeon: William Bellows, MD;  Location: [facility] ENDOSCOPY;  Service: Endoscopy;  Laterality: N/A;  . HERNIA REPAIR  2013  . LUMBAR SPINE SURGERY    . UPPER GI ENDOSCOPY  Sept 2015   Dr William Taylor    Current Meds  Medication Sig  . amiodarone (PACERONE) 200 MG tablet TAKE 1 TABLET BY MOUTH  DAILY AS NEEDED  . cyproheptadine (PERIACTIN) 4 MG tablet Take 1 tablet (4 mg total) by mouth 3 (three) times daily as needed for allergies.  Marland Kitchen diltiazem (CARDIZEM CD) 120 MG 24 hr capsule TAKE 1 CAPSULE BY MOUTH  DAILY  . esomeprazole (NEXIUM) 40 MG capsule TAKE 1 CAPSULE BY MOUTH  DAILY AT 12 NOON  . fenofibrate micronized (ANTARA) 130 MG capsule TAKE 1 CAPSULE BY MOUTH  DAILY BEFORE BREAKFAST  . finasteride (PROSCAR) 5 MG tablet Take 1 tablet (5 mg total) by mouth daily.  Marland Kitchen levobunolol (BETAGAN) 0.5 % ophthalmic solution Place 1 drop into the right eye 2 (two) times daily.   Marland Kitchen lisinopril (PRINIVIL,ZESTRIL) 2.5 MG tablet Take 2.5 mg by mouth at bedtime.     . metoprolol tartrate (LOPRESSOR) 25 MG tablet Take 1 tablet (25 mg total) by mouth 2 (two) times daily.  . nitroGLYCERIN (NITROSTAT) 0.4 MG SL tablet DISSOLVE UNDER THE TONGUE 1 TABLET EVERY 5 MINUTES AS NEEDED FOR CHEST PAIN  . Omega-3 Fatty Acids (FISH OIL) 1000 MG CAPS Take 1,000 mg by mouth daily.  . polyethylene glycol powder (GLYCOLAX/MIRALAX) powder Take 17 g by mouth 2 (two) times daily as needed.  . pravastatin (PRAVACHOL) 40 MG tablet TAKE 1 TABLET BY MOUTH AT  BEDTIME  . tamsulosin (FLOMAX) 0.4 MG CAPS capsule Take 1 capsule (0.4 mg total) by mouth daily.  Alveda Reasons 20 MG TABS tablet TAKE 1 TABLET DAILY    Allergies:   Bee venom; Sulfasalazine; Sulfa antibiotics; and Warfarin  and related   Social History:  The patient  reports that he has never smoked. He has never used smokeless tobacco. He reports that he does not drink alcohol or use drugs.   Family History:  The patient's family history includes Colon cancer in his father; Coronary artery disease in his brother; Other (age of onset: 44) in his brother; Stroke in his mother.  ROS:   Review of Systems  Constitutional: Positive for malaise/fatigue. Negative for chills, diaphoresis, fever and weight loss.  HENT: Negative for congestion.   Eyes: Negative for discharge and redness.  Respiratory: Positive for shortness of breath. Negative for cough, hemoptysis, sputum production and wheezing.   Cardiovascular: Positive for chest pain. Negative for palpitations, orthopnea, claudication, leg swelling and PND.  Gastrointestinal: Positive for abdominal pain. Negative for blood in stool, heartburn, melena, nausea and vomiting.  Genitourinary: Negative for hematuria.  Musculoskeletal: Negative for falls and myalgias.  Skin: Negative for rash.  Neurological: Positive for weakness. Negative for dizziness, tingling, tremors, sensory change, speech change, focal weakness and loss of consciousness.  Endo/Heme/Allergies: Does not  bruise/bleed easily.  Psychiatric/Behavioral: Negative for substance abuse. The patient is not nervous/anxious.   All other systems reviewed and are negative.    PHYSICAL EXAM:  VS:  BP 136/76 (BP Location: Left Arm, Patient Position: Sitting, Cuff Size: Normal)   Pulse 61   Ht 6\' 4"  (1.93 m)   Wt 218 lb (98.9 kg)   BMI 26.54 kg/m  BMI: Body mass index is 26.54 kg/m.  Physical Exam  Constitutional: He is oriented to person, place, and time. He appears well-developed and well-nourished.  HENT:  Head: Normocephalic and atraumatic.  Eyes: Right eye exhibits no discharge. Left eye exhibits no discharge.  Neck: Normal range of motion. No JVD present.  Cardiovascular: Normal rate, regular rhythm, S1 normal, S2 normal and normal heart sounds. Exam reveals no distant heart sounds, no friction rub, no midsystolic click and no opening snap.  No murmur heard. Pulses:      Posterior tibial pulses are 2+ on the right side, and 2+ on the left side.  Pulmonary/Chest: Effort normal and breath sounds normal. No respiratory distress. He has no decreased breath sounds. He has no wheezes. He has no rales. He exhibits no tenderness.  Abdominal: Soft. He exhibits no distension. There is no tenderness.  Musculoskeletal: He exhibits no edema.  Neurological: He is alert and oriented to person, place, and time.  Skin: Skin is warm and dry. No cyanosis. Nails show no clubbing.  Psychiatric: He has a normal mood and affect. His speech is normal and behavior is normal. Judgment and thought content normal.     EKG:  Was ordered and interpreted by me today. Shows NSR, 61 bpm, nonspecific st/t changes (essentially unchanged from prior)  Recent Labs: 02/16/2018: TSH 4.25 06/25/2018: ALT 17 07/01/2018: BUN 32; Creatinine, Ser 1.29; Hemoglobin 10.6; Platelets 226; Potassium 5.2; Sodium 137  No results found for requested labs within last 8760 hours.   Estimated Creatinine Clearance: 50.5 mL/min (A) (by C-G  formula based on SCr of 1.29 mg/dL (H)).   Wt Readings from Last 3 Encounters:  07/21/18 218 lb (98.9 kg)  07/09/18 221 lb 3.2 oz (100.3 kg)  07/01/18 218 lb 4.1 oz (99 kg)     Other studies reviewed: Additional studies/records reviewed today include: summarized above  ASSESSMENT AND PLAN:  1. CAD involving the native coronary arteries with atypical chest pain: Currently asymptomatic.  Long discussion with patient and  his wife regarding potential etiology and evaluation modalities.  I think it is considerably less likely that the patient has a recurrent PE given that he has been maintained on Xarelto with compliance in the setting of his underlying A. fib.  We did discuss potential evaluation for this including a CTA chest, VQ scan, and d-dimer.  Given the patient's underlying CKD stage III CTA chest is less than ideal.  I feel like the probability of him having a PE is actually quite low given he has been compliant with Xarelto therefore we have deferred a VQ scan and d-dimer at this time.  We did discuss potential invasive ischemic cardiac evaluation with catheterization and agreed to defer this at this time given his symptoms have improved and he had a recent low risk nuclear stress test earlier this year.  We will initiate him on isosorbide mononitrate 30 mg daily.  He will otherwise continue Cardizem CD and Lopressor.  He will contact us if his symptoms change or worsen.  2. PAF: Currently in sinus rhythm.  Remains on Cardizem and Lopressor.  Xarelto as above.  He reports compliance with his medication.  Defer his PRN amiodarone to his primary cardiologist.  3. History of PE: Uncertain etiology as patient reports a PE in 2006 while living in [place] as well as recurrent PE and 2012 following cardiac catheterization.  I recommend that he discuss this with his hematologist.  Nonetheless, he remains on Xarelto in the setting of his underlying atrial fibrillation.  4. CKD stage III:  Stable.  Followed by PCP.  5. Cold agglutinin hemolytic anemia: Followed by hematology.  6. Hypertension: Blood pressure is reasonably controlled today.  Continue current medications. Disposition: F/u with Dr. Rockey Situ or an APP in 2 weeks.  Current medicines are reviewed at length with the patient today.  The patient did not have any concerns regarding medicines.  Signed, Christell Faith, PA-C 07/21/2018 2:44 PM     [address] [place], [place] [address] [phone]

## 2018-07-21 ENCOUNTER — Ambulatory Visit: Payer: Medicare HMO | Admitting: Physician Assistant

## 2018-07-21 ENCOUNTER — Encounter: Payer: Self-pay | Admitting: Physician Assistant

## 2018-07-21 VITALS — BP 136/76 | HR 61 | Ht 76.0 in | Wt 218.0 lb

## 2018-07-21 DIAGNOSIS — I480 Paroxysmal atrial fibrillation: Secondary | ICD-10-CM | POA: Diagnosis not present

## 2018-07-21 DIAGNOSIS — I2510 Atherosclerotic heart disease of native coronary artery without angina pectoris: Secondary | ICD-10-CM | POA: Diagnosis not present

## 2018-07-21 DIAGNOSIS — N183 Chronic kidney disease, stage 3 unspecified: Secondary | ICD-10-CM

## 2018-07-21 DIAGNOSIS — R0789 Other chest pain: Secondary | ICD-10-CM | POA: Diagnosis not present

## 2018-07-21 DIAGNOSIS — Z86711 Personal history of pulmonary embolism: Secondary | ICD-10-CM | POA: Diagnosis not present

## 2018-07-21 DIAGNOSIS — D5912 Cold autoimmune hemolytic anemia: Secondary | ICD-10-CM

## 2018-07-21 DIAGNOSIS — D591 Other autoimmune hemolytic anemias: Secondary | ICD-10-CM | POA: Diagnosis not present

## 2018-07-21 DIAGNOSIS — I10 Essential (primary) hypertension: Secondary | ICD-10-CM

## 2018-07-21 MED ORDER — ISOSORBIDE MONONITRATE ER 30 MG PO TB24: 30.0000 mg | ORAL_TABLET | Freq: Every day | ORAL | 3 refills | Status: DC

## 2018-07-21 NOTE — Patient Instructions (Signed)
Medication Instructions:  Your physician has recommended you make the following change in your medication:  1- START Imdur 30 mg (1 tablet) by mouth once a day.  If you need a refill on your cardiac medications before your next appointment, please call your pharmacy.   Lab work: none If you have labs (blood work) drawn today and your tests are completely normal, you will receive your results only by: Marland Kitchen MyChart Message (if you have MyChart) OR . A paper copy in the mail If you have any lab test that is abnormal or we need to change your treatment, we will call you to review the results.  Testing/Procedures: none  Follow-Up: At [facility], you and your health needs are our priority.  As part of our continuing mission to provide you with exceptional heart care, we have created designated Provider Care Teams.  These Care Teams include your primary Cardiologist (physician) and Advanced Practice Providers (APPs -  Physician Assistants and Nurse Practitioners) who all work together to provide you with the care you need, when you need it. You will need a follow up appointment in 2 weeks. You may see Dr Rockey Situ or one of the following Advanced Practice Providers on your designated Care Team:   Murray Hodgkins, NP Christell Faith, PA-C . Marrianne Mood, PA-C

## 2018-07-24 DIAGNOSIS — M6281 Muscle weakness (generalized): Secondary | ICD-10-CM | POA: Diagnosis not present

## 2018-07-24 DIAGNOSIS — M25562 Pain in left knee: Secondary | ICD-10-CM | POA: Diagnosis not present

## 2018-07-27 DIAGNOSIS — M6281 Muscle weakness (generalized): Secondary | ICD-10-CM | POA: Diagnosis not present

## 2018-07-27 DIAGNOSIS — M25562 Pain in left knee: Secondary | ICD-10-CM | POA: Diagnosis not present

## 2018-07-27 DIAGNOSIS — M9902 Segmental and somatic dysfunction of thoracic region: Secondary | ICD-10-CM | POA: Diagnosis not present

## 2018-07-27 DIAGNOSIS — M5135 Other intervertebral disc degeneration, thoracolumbar region: Secondary | ICD-10-CM | POA: Diagnosis not present

## 2018-07-28 ENCOUNTER — Other Ambulatory Visit: Payer: Self-pay

## 2018-07-28 DIAGNOSIS — C61 Malignant neoplasm of prostate: Secondary | ICD-10-CM

## 2018-07-29 ENCOUNTER — Other Ambulatory Visit: Payer: Medicare HMO

## 2018-07-29 DIAGNOSIS — C61 Malignant neoplasm of prostate: Secondary | ICD-10-CM | POA: Diagnosis not present

## 2018-07-30 ENCOUNTER — Inpatient Hospital Stay: Payer: Medicare HMO | Attending: Oncology

## 2018-07-30 DIAGNOSIS — D5912 Cold autoimmune hemolytic anemia: Secondary | ICD-10-CM

## 2018-07-30 DIAGNOSIS — D591 Other autoimmune hemolytic anemias: Secondary | ICD-10-CM | POA: Diagnosis not present

## 2018-07-30 LAB — COMPREHENSIVE METABOLIC PANEL
ALT: 17 U/L (ref 0–44)
AST: 27 U/L (ref 15–41)
Albumin: 3.7 g/dL (ref 3.5–5.0)
Alkaline Phosphatase: 22 U/L — ABNORMAL LOW (ref 38–126)
Anion gap: 5 (ref 5–15)
BUN: 25 mg/dL — AB (ref 8–23)
CHLORIDE: 108 mmol/L (ref 98–111)
CO2: 24 mmol/L (ref 22–32)
CREATININE: 1.29 mg/dL — AB (ref 0.61–1.24)
Calcium: 8.9 mg/dL (ref 8.9–10.3)
GFR calc Af Amer: 58 mL/min — ABNORMAL LOW (ref >60)
GFR, EST NON AFRICAN AMERICAN: 50 mL/min — AB (ref >60)
Glucose, Bld: 125 mg/dL — ABNORMAL HIGH (ref 70–99)
POTASSIUM: 4.1 mmol/L (ref 3.5–5.1)
SODIUM: 137 mmol/L (ref 135–145)
TOTAL PROTEIN: 6.1 g/dL — AB (ref 6.5–8.1)
Total Bilirubin: 1.2 mg/dL (ref 0.3–1.2)

## 2018-07-30 LAB — RETICULOCYTES
Immature Retic Fract: 16.2 % — ABNORMAL HIGH (ref 2.3–15.9)
RBC.: 2.92 MIL/uL — ABNORMAL LOW (ref 4.22–5.81)
RETIC COUNT ABSOLUTE: 105.7 10*3/uL (ref 19.0–186.0)
RETIC CT PCT: 3.6 % — AB (ref 0.4–3.1)

## 2018-07-30 LAB — CBC WITH DIFFERENTIAL/PLATELET
Abs Immature Granulocytes: 0.08 10*3/uL — ABNORMAL HIGH (ref 0.00–0.07)
BASOS ABS: 0 10*3/uL (ref 0.0–0.1)
Basophils Relative: 1 %
EOS PCT: 1 %
Eosinophils Absolute: 0.1 10*3/uL (ref 0.0–0.5)
HCT: 30 % — ABNORMAL LOW (ref 39.0–52.0)
Hemoglobin: 9.9 g/dL — ABNORMAL LOW (ref 13.0–17.0)
IMMATURE GRANULOCYTES: 1 %
LYMPHS PCT: 20 %
Lymphs Abs: 1.3 10*3/uL (ref 0.7–4.0)
MCH: 33.9 pg (ref 26.0–34.0)
MCHC: 33 g/dL (ref 30.0–36.0)
MCV: 102.7 fL — AB (ref 80.0–100.0)
Monocytes Absolute: 0.5 10*3/uL (ref 0.1–1.0)
Monocytes Relative: 8 %
NEUTROS PCT: 69 %
NRBC: 0 % (ref 0.0–0.2)
Neutro Abs: 4.6 10*3/uL (ref 1.7–7.7)
PLATELETS: 224 10*3/uL (ref 150–400)
RBC: 2.92 MIL/uL — ABNORMAL LOW (ref 4.22–5.81)
RDW: 14.4 % (ref 11.5–15.5)
WBC: 6.6 10*3/uL (ref 4.0–10.5)

## 2018-07-30 LAB — PSA: Prostate Specific Ag, Serum: <0.1 ng/mL (ref 0.0–4.0)

## 2018-07-31 ENCOUNTER — Ambulatory Visit: Payer: Medicare Other | Admitting: Urology

## 2018-07-31 ENCOUNTER — Other Ambulatory Visit: Payer: Self-pay | Admitting: Cardiovascular Disease

## 2018-07-31 LAB — HAPTOGLOBIN: Haptoglobin: <10 mg/dL — ABNORMAL LOW (ref 34–200)

## 2018-08-02 ENCOUNTER — Other Ambulatory Visit: Payer: Self-pay | Admitting: Physician Assistant

## 2018-08-02 MED ORDER — RANOLAZINE ER 500 MG PO TB12: 500.0000 mg | ORAL_TABLET | Freq: Two times a day (BID) | ORAL | 3 refills | Status: DC

## 2018-08-05 DIAGNOSIS — M6281 Muscle weakness (generalized): Secondary | ICD-10-CM | POA: Diagnosis not present

## 2018-08-05 DIAGNOSIS — M25562 Pain in left knee: Secondary | ICD-10-CM | POA: Diagnosis not present

## 2018-08-07 DIAGNOSIS — M6281 Muscle weakness (generalized): Secondary | ICD-10-CM | POA: Diagnosis not present

## 2018-08-07 DIAGNOSIS — M1712 Unilateral primary osteoarthritis, left knee: Secondary | ICD-10-CM | POA: Diagnosis not present

## 2018-08-07 DIAGNOSIS — M25562 Pain in left knee: Secondary | ICD-10-CM | POA: Diagnosis not present

## 2018-08-10 ENCOUNTER — Encounter: Payer: Self-pay | Admitting: Urology

## 2018-08-10 ENCOUNTER — Ambulatory Visit (INDEPENDENT_AMBULATORY_CARE_PROVIDER_SITE_OTHER): Payer: Medicare HMO | Admitting: Urology

## 2018-08-10 VITALS — BP 98/62 | HR 69 | Ht 76.0 in | Wt 220.0 lb

## 2018-08-10 DIAGNOSIS — C61 Malignant neoplasm of prostate: Secondary | ICD-10-CM

## 2018-08-10 NOTE — Progress Notes (Signed)
Cardiology Office Note Date:  08/17/2018  Patient ID:  William Taylor [birth year], MRN [id number] PCP:  [name], MD  Cardiologist:  Dr. Rockey Situ, MD    Chief Complaint: Follow up  History of Present Illness: William Taylor is a 84 y.o. male with history of CAD s/p LAD stenting in 2005 s/p PCI to the [place] in 2012, PAF on Xarelto, PE in 2006 while in living [facility], PE following catheterization in 2012, atypical chest pain, CKD stage III, cold agglutinin hemolytic anemia, prostate cancer, orthostatic hypotension, HTN, and HLD who presents for follow up of chest pain.   Patient previously underwent stenting to the LAD in 2005. LHC in 2009 showed a patent LAD stent without other significant disease. He was diagnosed with Afib in 04/2010 in the setting of a UTI. He has had recurrent Afib in at least 2012, 2013, and 2018. Over the years he has been anticoagulated with Xarelto and been on metoprolol as well as amiodarone with medications previously being held secondary to bradycardia. Repeat cardiac cath in 2012 showed 90% OM2 stenosis and underwent PCI/DES at [facility]. Following the cath, he had flank pain and was diagnosed with a PE with heavy clot burden. Most recent cardiac cath from 10/2013 showed mid LAD-1 lesion 10% ISR, mid LAD-2 lesion 40%, D1-1 lesion 90%, D1-2 lesion 70%, OM1 80%, OM2 stent patent without residual restenosis. Medical management was advised. Most recent ischemic evaluation from 09/2017 via nuclear stress testing showed no significant ischemia, EF 69%, low risk scan. Most recent echo from 02/2017 showed an EF of 55-60%, mild to moderate LVH, no RWMA, Gr1DD, mildly dilated aortic root at 40 mm, no AI, moderately dilated left atrium.   Patient was seen in the ED on 07/01/2018 with nonexertional chest pain that lasted ~ 20 minutes and resolved. Pain was described as starting on the left lateral side of his chest and radiated to the left upper quadrant and epigastric region of  his abdomen. BP noted to be elevated at 183/93. Troponin negative x 3. WBC 9.2, HGB 10.6, PLT 226. Potassium 5.2 with hemolysis, SCr 1.29. CXR not acute. EKG is not in Epic for cardiology review. He was discharged to outpatient follow up.   He has since been seen by hematology for cold agglutinin hemolytic anemia. CT chest/abdomen/pelvis on 07/07/18 showed mild prostatomegaly with no findings specific for metastatic disease and a 4 mm left lower lobe pulmonary nodule.  He was seen in the office on 07/21/2018 for ED follow up of the above chest pain. He continued to note exertional dyspnea that dated back to early 2019 in which she underwent nuclear stress testing in 09/2017 as detailed above.  He had noted improvement in his chest pain and preferred to conservatively manage this. He was started on Imdur 30 mg daily. Unfortunately, Imdur caused a headache and this was discontinued. He was started on Ranexa 500 mg bid.   Patient comes in accompanied by his wife today.  He has not had any further symptoms of chest pain.  He continues to note exertional dyspnea that dates back to early 2019 as well as positional dizziness with a generalized "unwell" feeling that is described as nausea.  He was recently seen by urology with blood pressure noted to be 98/62.  No palpitations, lower extremity swelling, abdominal distention, orthopnea, PND, or early satiety.  No presyncope or syncope.  Patient indicates sometimes his dizziness will improve with sitting and sometimes it "continues to last."  He  is currently asymptomatic.    Past Medical History:  Diagnosis Date  . Cancer [facility])    Prostate, followed by Dr. Jacqlyn Larsen  . Colon polyp   . Coronary artery disease   . Hyperlipidemia   . Hypertension   . MI (myocardial infarction) ([place])    2015  . PAF (paroxysmal atrial fibrillation) ([place])    a. on xarelto  . Skin cancer     Past Surgical History:  Procedure Laterality Date  . CARDIAC [facility]IZATION  [facility]  . CATARACT EXTRACTION  Oct. 3, 2012   right eye  . colonoscopy    . COLONOSCOPY W/ POLYPECTOMY  2015   Dr Rayann Heman  . COLONOSCOPY WITH PROPOFOL N/A 08/15/2016   Procedure: COLONOSCOPY WITH PROPOFOL;  Surgeon: [name], MD;  Location: [facility] ENDOSCOPY;  Service: Endoscopy;  Laterality: N/A;  . CORONARY ANGIOPLASTY  2009   2005; s/p stent  . ESOPHAGOGASTRODUODENOSCOPY (EGD) WITH PROPOFOL N/A 08/15/2016   Procedure: ESOPHAGOGASTRODUODENOSCOPY (EGD) WITH PROPOFOL;  Surgeon: [name], MD;  Location: [facility] ENDOSCOPY;  Service: Endoscopy;  Laterality: N/A;  . GIVENS CAPSULE STUDY N/A 09/26/2016   Procedure: GIVENS CAPSULE STUDY;  Surgeon: [name], MD;  Location: [facility] ENDOSCOPY;  Service: Endoscopy;  Laterality: N/A;  . HERNIA REPAIR  2013  . LUMBAR SPINE SURGERY    . UPPER GI ENDOSCOPY  Sept 2015   Dr Rayann Heman    Current Meds  Medication Sig  . amiodarone (PACERONE) 200 MG tablet TAKE 1 TABLET BY MOUTH  DAILY AS NEEDED  . cyproheptadine (PERIACTIN) 4 MG tablet Take 1 tablet (4 mg total) by mouth 3 (three) times daily as needed for allergies.  Marland Kitchen diltiazem (CARDIZEM CD) 120 MG 24 hr capsule TAKE 1 CAPSULE BY MOUTH  DAILY  . esomeprazole (NEXIUM) 40 MG capsule TAKE 1 CAPSULE BY MOUTH  DAILY AT 12 NOON  . fenofibrate micronized (ANTARA) 130 MG capsule TAKE 1 CAPSULE BY MOUTH  DAILY BEFORE BREAKFAST  . finasteride (PROSCAR) 5 MG tablet Take 1 tablet (5 mg total) by mouth daily.  Marland Kitchen levobunolol (BETAGAN) 0.5 % ophthalmic solution Place 1 drop into the right eye 2 (two) times daily.   Marland Kitchen lisinopril (PRINIVIL,ZESTRIL) 2.5 MG tablet Take 2.5 mg by mouth at bedtime.   . metoprolol tartrate (LOPRESSOR) 25 MG tablet Take 1 tablet (25 mg total) by mouth 2 (two) times daily.  . nitroGLYCERIN (NITROSTAT) 0.4 MG SL tablet DISSOLVE UNDER THE TONGUE 1 TABLET EVERY 5 MINUTES AS NEEDED FOR CHEST PAIN  . Omega-3 Fatty Acids (FISH OIL) 1000 MG CAPS Take 1,000 mg by mouth daily.  . polyethylene glycol powder  (GLYCOLAX/MIRALAX) powder Take 17 g by mouth 2 (two) times daily as needed.  . pravastatin (PRAVACHOL) 40 MG tablet TAKE 1 TABLET BY MOUTH AT  BEDTIME  . ranolazine (RANEXA) 500 MG 12 hr tablet Take 1 tablet (500 mg total) by mouth 2 (two) times daily.  . tamsulosin (FLOMAX) 0.4 MG CAPS capsule Take 1 capsule (0.4 mg total) by mouth daily.  Alveda Reasons 20 MG TABS tablet TAKE 1 TABLET DAILY    Allergies:   Bee venom; Sulfasalazine; Sulfa antibiotics; and Warfarin and related   Social History:  The patient  reports that he has never smoked. He has never used smokeless tobacco. He reports that he does not drink alcohol or use drugs.   Family History:  The patient's family history includes Colon cancer in his father; Coronary artery disease in his brother; Other (age  of onset: 49) in his brother; Stroke in his mother.  ROS:   Review of Systems  Constitutional: Positive for malaise/fatigue. Negative for chills, diaphoresis, fever and weight loss.  HENT: Negative for congestion.   Eyes: Negative for discharge and redness.  Respiratory: Positive for shortness of breath. Negative for cough, hemoptysis, sputum production and wheezing.   Cardiovascular: Negative for chest pain, palpitations, orthopnea, claudication, leg swelling and PND.  Gastrointestinal: Positive for nausea. Negative for abdominal pain, blood in stool, heartburn, melena and vomiting.  Genitourinary: Negative for hematuria.  Musculoskeletal: Negative for falls and myalgias.  Skin: Negative for rash.  Neurological: Positive for dizziness and weakness. Negative for tingling, tremors, sensory change, speech change, focal weakness and loss of consciousness.  Endo/Heme/Allergies: Does not bruise/bleed easily.  Psychiatric/Behavioral: Negative for substance abuse. The patient is not nervous/anxious.      PHYSICAL EXAM:  VS:  BP 100/60 (BP Location: Left Arm, Patient Position: Sitting, Cuff Size: Normal)   Pulse 69   Ht 6\' 4"  (1.93 m)    Wt 217 lb 8 oz (98.7 kg)   BMI 26.47 kg/m  BMI: Body mass index is 26.47 kg/m.  Physical Exam  Constitutional: He is oriented to person, place, and time. He appears well-developed and well-nourished.  HENT:  Head: Normocephalic and atraumatic.  Eyes: Right eye exhibits no discharge. Left eye exhibits no discharge.  Neck: Normal range of motion. No JVD present.  Cardiovascular: Normal rate, regular rhythm, S1 normal, S2 normal and normal heart sounds. Exam reveals no distant heart sounds, no friction rub, no midsystolic click and no opening snap.  No murmur heard. Pulses:      Posterior tibial pulses are 2+ on the right side and 2+ on the left side.  Pulmonary/Chest: Effort normal and breath sounds normal. No respiratory distress. He has no decreased breath sounds. He has no wheezes. He has no rales. He exhibits no tenderness.  Abdominal: Soft. He exhibits no distension. There is no abdominal tenderness.  Musculoskeletal:        General: No edema.  Neurological: He is alert and oriented to person, place, and time.  Skin: Skin is warm and dry. No cyanosis. Nails show no clubbing.  Psychiatric: He has a normal mood and affect. His speech is normal and behavior is normal. Judgment and thought content normal.     EKG:  Was ordered and interpreted by me today. Shows NSR, 69 bpm, 1st degree AV block, poo R wave progression along the precordial leads, no acute st/t changes   Recent Labs: 02/16/2018: TSH 4.25 07/30/2018: ALT 17; BUN 25; Creatinine, Ser 1.29; Hemoglobin 9.9; Platelets 224; Potassium 4.1; Sodium 137  No results found for requested labs within last 8760 hours.   Estimated Creatinine Clearance: 50.5 mL/min (A) (by C-G formula based on SCr of 1.29 mg/dL (H)).   Wt Readings from Last 3 Encounters:  08/17/18 217 lb 8 oz (98.7 kg)  08/10/18 220 lb (99.8 kg)  07/21/18 218 lb (98.9 kg)     Other studies reviewed: Additional studies/records reviewed today include: summarized  above  ASSESSMENT AND PLAN:  1. CAD involving the native coronary arteries without angina: No further symptoms concerning for angina/chest pain.  Continue current therapy with metoprolol, Cardizem, pravastatin, and Ranexa.  He was intolerant to isosorbide mononitrate secondary to headache.  His orthostatic hypotension precludes further escalation of metoprolol at this time.  No plans for further ischemic evaluation at this time.  2. PAF: He is maintaining sinus rhythm.  Discussed with the patient some of his symptoms certainly may be related to amiodarone adverse effects.  We have decided to maintain him on amiodarone while we taper some of his antihypertensives as outlined below given his relative hypotension with orthostatic dizziness.  Remains on Xarelto.  3. History of PE: On chronic Xarelto therapy as above.  4. Orthostatic hypotension: This has been a longstanding issue for him.  We have discontinued his lisinopril and decreased his Lopressor to 12.5 mg twice daily.  Recommend he maintain adequate hydration.  5. Hypertension: Blood pressure on the soft side today.  Discontinue lisinopril and taper Lopressor as above.  6. Cold agglutinin hemolytic anemia: Followed by hematology.  Disposition: F/u with Dr. Rockey Situ or an APP at previously scheduled appointment in February.  Current medicines are reviewed at length with the patient today.  The patient did not have any concerns regarding medicines.  Signed, Christell Faith, PA-C 08/17/2018 10:37 AM     [place] [name] [place] [address] [place], [place] [address] [phone]

## 2018-08-10 NOTE — Progress Notes (Signed)
08/10/2018 1:41 PM   William Taylor [birth year] [id number]  Referring provider: [name], MD [address] [place], [place] [address]  Chief Complaint  Patient presents with  . Prostate Cancer   Urologic history: 1. 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 Jacqlyn Larsen; PSA 5.7; 77 g gland   -focus Gleason 3+3 RLB  -Elected active surveillance; started finasteride  -Baseline PSA 0.1-0.6 since 442  HPI: 84 year old male presents for follow-up of very low risk prostate cancer.  Since his last visit he states he has been doing well.  He is on tamsulosin and finasteride.  His most bothersome urinary symptom is nocturia every 2 hours which she describes as moderate volume.  Denies dysuria, gross hematuria or flank/abdominal/pelvic/scrotal pain.  PSA 07/29/2018 was <0.1  PMH: Past Medical History:  Diagnosis Date  . Cancer [facility])    Prostate, followed by Dr. Jacqlyn Larsen  . Colon polyp   . Coronary artery disease   . Hyperlipidemia   . Hypertension   . MI (myocardial infarction) ([place])    2015  . PAF (paroxysmal atrial fibrillation) ([place])    a. on xarelto  . Skin cancer     Surgical History: Past Surgical History:  Procedure Laterality Date  . CARDIAC CATHETERIZATION  [facility]   [facility]  . CATARACT EXTRACTION  Oct. 3, 2012   right eye  . colonoscopy    . COLONOSCOPY W/ POLYPECTOMY  2015   Dr Rayann Heman  . COLONOSCOPY WITH PROPOFOL N/A 08/15/2016   Procedure: COLONOSCOPY WITH PROPOFOL;  Surgeon: [name], MD;  Location: [facility] ENDOSCOPY;  Service: Endoscopy;  Laterality: N/A;  . CORONARY ANGIOPLASTY  2009   2005; s/p stent  . ESOPHAGOGASTRODUODENOSCOPY (EGD) WITH PROPOFOL N/A 08/15/2016   Procedure: ESOPHAGOGASTRODUODENOSCOPY (EGD) WITH PROPOFOL;  Surgeon: [name], MD;  Location: [facility] ENDOSCOPY;  Service: Endoscopy;  Laterality: N/A;  . GIVENS CAPSULE STUDY N/A 09/26/2016   Procedure: GIVENS CAPSULE STUDY;  Surgeon: [name], MD;   Location: [facility] ENDOSCOPY;  Service: Endoscopy;  Laterality: N/A;  . HERNIA REPAIR  2013  . LUMBAR SPINE SURGERY    . UPPER GI ENDOSCOPY  Sept 2015   Dr Rudean Hitt Medications:  Allergies as of 08/10/2018      Reactions   Bee Venom Swelling   Sulfasalazine Hives, Swelling   Sulfa Antibiotics Hives, Swelling   Warfarin And Related Other (See Comments)   Chest pain      Medication List       Accurate as of August 10, 2018  1:41 PM. Always use your most recent med list.        amiodarone 200 MG tablet Commonly known as:  PACERONE TAKE 1 TABLET BY MOUTH  DAILY AS NEEDED   cyproheptadine 4 MG tablet Commonly known as:  PERIACTIN Take 1 tablet (4 mg total) by mouth 3 (three) times daily as needed for allergies.   diltiazem 120 MG 24 hr capsule Commonly known as:  CARDIZEM CD TAKE 1 CAPSULE BY MOUTH  DAILY   esomeprazole 40 MG capsule Commonly known as:  NEXIUM TAKE 1 CAPSULE BY MOUTH  DAILY AT 12 NOON   fenofibrate micronized 130 MG capsule Commonly known as:  ANTARA TAKE 1 CAPSULE BY MOUTH  DAILY BEFORE BREAKFAST   finasteride 5 MG tablet Commonly known as:  PROSCAR Take 1 tablet (5 mg total) by mouth daily.   Fish Oil 1000 MG Caps Take 1,000 mg by mouth daily.  levobunolol 0.5 % ophthalmic solution Commonly known as:  BETAGAN Place 1 drop into the right eye 2 (two) times daily.   lisinopril 2.5 MG tablet Commonly known as:  PRINIVIL,ZESTRIL Take 2.5 mg by mouth at bedtime.   metoprolol tartrate 25 MG tablet Commonly known as:  LOPRESSOR Take 1 tablet (25 mg total) by mouth 2 (two) times daily.   nitroGLYCERIN 0.4 MG SL tablet Commonly known as:  NITROSTAT DISSOLVE UNDER THE TONGUE 1 TABLET EVERY 5 MINUTES AS NEEDED FOR CHEST PAIN   polyethylene glycol powder powder Commonly known as:  GLYCOLAX/MIRALAX Take 17 g by mouth 2 (two) times daily as needed.   pravastatin 40 MG tablet Commonly known as:  PRAVACHOL TAKE 1 TABLET BY MOUTH AT  BEDTIME     ranolazine 500 MG 12 hr tablet Commonly known as:  RANEXA Take 1 tablet (500 mg total) by mouth 2 (two) times daily.   tamsulosin 0.4 MG Caps capsule Commonly known as:  FLOMAX Take 1 capsule (0.4 mg total) by mouth daily.   XARELTO 20 MG Tabs tablet Generic drug:  rivaroxaban TAKE 1 TABLET DAILY       Allergies:  Allergies  Allergen Reactions  . Bee Venom Swelling  . Sulfasalazine Hives and Swelling  . Sulfa Antibiotics Hives and Swelling  . Warfarin And Related Other (See Comments)    Chest pain    Family History: Family History  Problem Relation Age of Onset  . Stroke Mother   . Colon cancer Father   . Coronary artery disease Brother   . Other Brother 27       lymes dz    Social History:  reports that he has never smoked. He has never used smokeless tobacco. He reports that he does not drink alcohol or use drugs.  ROS: UROLOGY Frequent Urination?: Yes Hard to postpone urination?: No Burning/pain with urination?: No Get up at night to urinate?: Yes Leakage of urine?: No Urine stream starts and stops?: No Trouble starting stream?: No Do you have to strain to urinate?: No Blood in urine?: No Urinary tract infection?: No Sexually transmitted disease?: No Injury to kidneys or bladder?: No Painful intercourse?: No Weak stream?: No Erection problems?: No Penile pain?: No  Gastrointestinal Nausea?: No Vomiting?: No Indigestion/heartburn?: No Diarrhea?: No Constipation?: Yes  Constitutional Fever: No Night sweats?: No Weight loss?: No Fatigue?: Yes  Skin Skin rash/lesions?: No Itching?: No  Eyes Blurred vision?: No Double vision?: No  Ears/Nose/Throat Sore throat?: No Sinus problems?: No  Hematologic/Lymphatic Swollen glands?: No Easy bruising?: Yes  Cardiovascular Leg swelling?: No Chest pain?: Yes  Respiratory Cough?: No Shortness of breath?: Yes  Endocrine Excessive thirst?: No  Musculoskeletal Back pain?: No Joint  pain?: No  Neurological Headaches?: No Dizziness?: No  Psychologic Depression?: No Anxiety?: No  Physical Exam: BP 98/62 (BP Location: Left Arm, Patient Position: Sitting, Cuff Size: Large)   Pulse 69   Ht 6\' 4"  (1.93 m)   Wt 220 lb (99.8 kg)   BMI 26.78 kg/m   Constitutional:  Alert and oriented, No acute distress. HEENT: [place] AT, moist mucus membranes.  Trachea midline, no masses. Lymph: No cervical or inguinal lymphadenopathy. Skin: No rashes, bruises or suspicious lesions. Neurologic: Grossly intact, no focal deficits, moving all 4 extremities. Psychiatric: Normal mood and affect.   Assessment & Plan:   84 year old male with very low risk prostate cancer on active surveillance.  His PSA is extremely low.  Based on his age will go to annual  visits.  He will continue finasteride.  He does not have bothersome lower urinary tract symptoms with the exception of his nocturia which is most likely due to nocturnal polyuria.  I recommended he discontinue his tamsulosin.  If he notes worsening LUTS after stopping he may restart.   Abbie Sons, [place] [address] [place], [place] [address] [phone]

## 2018-08-17 ENCOUNTER — Ambulatory Visit: Payer: Medicare HMO | Admitting: Physician Assistant

## 2018-08-17 ENCOUNTER — Encounter: Payer: Self-pay | Admitting: Physician Assistant

## 2018-08-17 VITALS — BP 100/60 | HR 69 | Ht 76.0 in | Wt 217.5 lb

## 2018-08-17 DIAGNOSIS — Z86711 Personal history of pulmonary embolism: Secondary | ICD-10-CM | POA: Diagnosis not present

## 2018-08-17 DIAGNOSIS — D591 Other autoimmune hemolytic anemias: Secondary | ICD-10-CM | POA: Diagnosis not present

## 2018-08-17 DIAGNOSIS — I951 Orthostatic hypotension: Secondary | ICD-10-CM | POA: Diagnosis not present

## 2018-08-17 DIAGNOSIS — I480 Paroxysmal atrial fibrillation: Secondary | ICD-10-CM | POA: Diagnosis not present

## 2018-08-17 DIAGNOSIS — I2510 Atherosclerotic heart disease of native coronary artery without angina pectoris: Secondary | ICD-10-CM

## 2018-08-17 DIAGNOSIS — I10 Essential (primary) hypertension: Secondary | ICD-10-CM | POA: Diagnosis not present

## 2018-08-17 DIAGNOSIS — D5912 Cold autoimmune hemolytic anemia: Secondary | ICD-10-CM

## 2018-08-17 MED ORDER — METOPROLOL TARTRATE 25 MG PO TABS: 12.5000 mg | ORAL_TABLET | Freq: Two times a day (BID) | ORAL | 1 refills | Status: DC

## 2018-08-17 NOTE — Patient Instructions (Addendum)
Medication Instructions:  STOP the Lisinopril DECREASE the Metoprolol to 12.5 mg (half a tablet) twice daily  If you need a refill on your cardiac medications before your next appointment, please call your pharmacy.   Lab work: None ordered  Testing/Procedures: Your physician has requested that you have an echocardiogram. Echocardiography is a painless test that uses sound waves to create images of your heart. It provides your doctor with information about the size and shape of your heart and how well your heart's chambers and valves are working. You may receive an ultrasound enhancing agent through an IV if needed to better visualize your heart during the echo.This procedure takes approximately one hour. There are no restrictions for this procedure. This will take place at the [facility] clinic.    Follow-Up: Keep your follow up with Dr. Rockey Situ on 09/21/18

## 2018-08-21 ENCOUNTER — Telehealth: Payer: Self-pay | Admitting: Cardiovascular Disease

## 2018-08-21 ENCOUNTER — Other Ambulatory Visit: Payer: Self-pay | Admitting: Family Medicine

## 2018-08-21 ENCOUNTER — Ambulatory Visit: Payer: Self-pay

## 2018-08-21 NOTE — Telephone Encounter (Signed)
Pt c/o BP issue: STAT if pt c/o blurred vision, one-sided weakness or slurred speech  1. What are your last 5 BP readings? Patient wife will call with readings when she gets home   2. Are you having any other symptoms (ex. Dizziness, headache, blurred vision, passed out)? Nauseated weakness unable to get out of bed headache not sure if  Related to anemia issues or if related to new medication per RD last ov   3. What is your BP issue? Concerned bp too low at times has been taking TID at home will call back with readings soon

## 2018-08-21 NOTE — Telephone Encounter (Signed)
Discussed case with Christell Faith PA and he recommends that he hold metoprolol and increase fluid intake. She is on the other line with PCP office. Will call back once she is off the phone with them.

## 2018-08-21 NOTE — Telephone Encounter (Signed)
Spoke with patients wife per release form. She states that the patient has not been feeling well and having extreme fatigue. She reports that he stayed in bed all day and then another time all day in his recliner. He has had some dizziness and just does not feel well. She states that his blood pressure was sitting 89/61 and then she checked again standing and it was 78/43. She states that he has not been drinking enough. I asked if he had seen his primary care provider and that he should be seen by someone today. She is going to call their office to see if he can get in to be seen. Advised that I would send this message to provider that saw him last. She was appreciative for the call with no further questions at this time. Advised that I would call back with any further recommendations.

## 2018-08-21 NOTE — Telephone Encounter (Signed)
[name] called in and says that his BP is low and he's dizzy. She says his BP this morning was 116/68 sitting and this afternoon at 1430 sitting BP 89/61, standing 78/43. She says he is dizzy when standing and feels bad. She says he fell up against the bed walking to the bathroom this morning. I asked her to check it now and it was 132/81 sitting, p 74, then standing 80/51 P 85. She says she just hung up with the cardiologist nurse who told him to stop the Metoprolol and increase his fluid intake. I advised to follow those instructions and if in 24 hours he is no better to go to the UC or ED, she verbalized understanding.  Reason for Disposition . [7] Systolic BP 37-106 AND [2] taking blood pressure medications AND [3] dizzy, lightheaded or weak  Answer Assessment - Initial Assessment Questions 1. BLOOD PRESSURE: "What is the blood pressure?" "Did you take at least two measurements 5 minutes apart?"     89/61 2. ONSET: "When did you take your blood pressure?"     Just now 3. HOW: "How did you obtain the blood pressure?" (e.g., visiting nurse, automatic home BP monitor)     Automatic home BP monitor 4. HISTORY: "Do you have a history of low blood pressure?" "What is your blood pressure normally?"     No; normally runs 120-130/80 5. MEDICATIONS: "Are you taking any medications for blood pressure?" If yes: "Have they been changed recently?"     Yes; cardiologist PA took off lisinopril and put on relaxa BID and metoprolol 12.5 mg BID 6. PULSE RATE: "Do you know what your pulse rate is?"      74 7. OTHER SYMPTOMS: "Have you been sick recently?" "Have you had a recent injury?"     Dizziness when standing 8. PREGNANCY: "Is there any chance you are pregnant?" "When was your last menstrual period?"     N/A  Protocols used: LOW BLOOD PRESSURE-A-AH

## 2018-08-21 NOTE — Telephone Encounter (Signed)
Patient wife returning call  °

## 2018-08-21 NOTE — Telephone Encounter (Signed)
Left voicemail message to call back  

## 2018-08-21 NOTE — Telephone Encounter (Signed)
Pt c/o BP issue: STAT if pt c/o blurred vision, one-sided weakness or slurred speech  1. What are your last 5 BP readings?  Thurs 08/20/2018  109/50 121/74 115/71 99/68 last night - standing  Fri 08/21/2018 116/68  2. Are you having any other symptoms (ex. Dizziness, headache, blurred vision, passed out)? Very fatigued, not himself   3. What is your BP issue? BP has been low, worried it may be medication.  Please call to discuss

## 2018-08-21 NOTE — Telephone Encounter (Signed)
Sent to PCP ?

## 2018-08-21 NOTE — Telephone Encounter (Signed)
Spoke with patients wife per release form and reviewed recommendations again because previously she was on the phone with other provider office. She verbalized understanding of instructions and had no further questions at this time.

## 2018-08-22 NOTE — Telephone Encounter (Signed)
I agree with advice to follow the advice given by cardiology.  Please follow-up with the patient on Monday to see if he has felt improved.  If not he should be seen if he has not already been evaluated.

## 2018-08-24 ENCOUNTER — Encounter: Payer: Self-pay | Admitting: Oncology

## 2018-08-24 ENCOUNTER — Other Ambulatory Visit: Payer: Self-pay

## 2018-08-24 ENCOUNTER — Inpatient Hospital Stay: Payer: Medicare HMO

## 2018-08-24 ENCOUNTER — Inpatient Hospital Stay: Payer: Medicare HMO | Attending: Oncology | Admitting: Oncology

## 2018-08-24 VITALS — BP 156/96 | HR 86 | Temp 95.7°F | Resp 18 | Wt 214.8 lb

## 2018-08-24 DIAGNOSIS — D591 Other autoimmune hemolytic anemias: Secondary | ICD-10-CM

## 2018-08-24 DIAGNOSIS — R7989 Other specified abnormal findings of blood chemistry: Secondary | ICD-10-CM | POA: Diagnosis not present

## 2018-08-24 DIAGNOSIS — R5382 Chronic fatigue, unspecified: Secondary | ICD-10-CM

## 2018-08-24 DIAGNOSIS — Z8546 Personal history of malignant neoplasm of prostate: Secondary | ICD-10-CM | POA: Insufficient documentation

## 2018-08-24 DIAGNOSIS — N183 Chronic kidney disease, stage 3 (moderate): Secondary | ICD-10-CM | POA: Insufficient documentation

## 2018-08-24 DIAGNOSIS — I10 Essential (primary) hypertension: Secondary | ICD-10-CM

## 2018-08-24 DIAGNOSIS — Z7901 Long term (current) use of anticoagulants: Secondary | ICD-10-CM

## 2018-08-24 DIAGNOSIS — D5912 Cold autoimmune hemolytic anemia: Secondary | ICD-10-CM

## 2018-08-24 DIAGNOSIS — I259 Chronic ischemic heart disease, unspecified: Secondary | ICD-10-CM | POA: Diagnosis not present

## 2018-08-24 DIAGNOSIS — Z79899 Other long term (current) drug therapy: Secondary | ICD-10-CM | POA: Insufficient documentation

## 2018-08-24 DIAGNOSIS — I480 Paroxysmal atrial fibrillation: Secondary | ICD-10-CM | POA: Diagnosis not present

## 2018-08-24 LAB — COMPREHENSIVE METABOLIC PANEL
ALBUMIN: 4.3 g/dL (ref 3.5–5.0)
ALT: 19 U/L (ref 0–44)
ANION GAP: 9 (ref 5–15)
AST: 28 U/L (ref 15–41)
Alkaline Phosphatase: 30 U/L — ABNORMAL LOW (ref 38–126)
BUN: 26 mg/dL — ABNORMAL HIGH (ref 8–23)
CO2: 26 mmol/L (ref 22–32)
Calcium: 9.4 mg/dL (ref 8.9–10.3)
Chloride: 104 mmol/L (ref 98–111)
Creatinine, Ser: 1.5 mg/dL — ABNORMAL HIGH (ref 0.61–1.24)
GFR calc non Af Amer: 42 mL/min — ABNORMAL LOW (ref >60)
GFR, EST AFRICAN AMERICAN: 48 mL/min — AB (ref >60)
GLUCOSE: 107 mg/dL — AB (ref 70–99)
POTASSIUM: 3.9 mmol/L (ref 3.5–5.1)
SODIUM: 139 mmol/L (ref 135–145)
TOTAL PROTEIN: 7.2 g/dL (ref 6.5–8.1)
Total Bilirubin: 1.8 mg/dL — ABNORMAL HIGH (ref 0.3–1.2)

## 2018-08-24 LAB — CBC WITH DIFFERENTIAL/PLATELET
Abs Immature Granulocytes: 0.17 10*3/uL — ABNORMAL HIGH (ref 0.00–0.07)
BASOS PCT: 1 %
Basophils Absolute: 0.1 10*3/uL (ref 0.0–0.1)
EOS ABS: 0.1 10*3/uL (ref 0.0–0.5)
EOS PCT: 1 %
HEMATOCRIT: 35.1 % — AB (ref 39.0–52.0)
Hemoglobin: 11.8 g/dL — ABNORMAL LOW (ref 13.0–17.0)
Immature Granulocytes: 2 %
LYMPHS ABS: 1.4 10*3/uL (ref 0.7–4.0)
Lymphocytes Relative: 14 %
MCH: 34.1 pg — ABNORMAL HIGH (ref 26.0–34.0)
MCHC: 33.6 g/dL (ref 30.0–36.0)
MCV: 101.4 fL — AB (ref 80.0–100.0)
MONOS PCT: 8 %
Monocytes Absolute: 0.8 10*3/uL (ref 0.1–1.0)
NRBC: 0 % (ref 0.0–0.2)
Neutro Abs: 7.3 10*3/uL (ref 1.7–7.7)
Neutrophils Relative %: 74 %
Platelets: 284 10*3/uL (ref 150–400)
RBC: 3.46 MIL/uL — ABNORMAL LOW (ref 4.22–5.81)
RDW: 13.6 % (ref 11.5–15.5)
WBC: 9.8 10*3/uL (ref 4.0–10.5)

## 2018-08-24 LAB — RETICULOCYTES
Immature Retic Fract: 14 % (ref 2.3–15.9)
RBC.: 3.46 MIL/uL — AB (ref 4.22–5.81)
RETIC COUNT ABSOLUTE: 94.8 10*3/uL (ref 19.0–186.0)
RETIC CT PCT: 2.7 % (ref 0.4–3.1)

## 2018-08-24 NOTE — Progress Notes (Signed)
Here for follow up. Per pt having dizzy since last week (31 st of Dec started ) stated BP was dropping sp w Dr Donnetta Hail ,and Dr Nevada Crane office-    lopressor was put on hold -per pt going to f/u w Dr Dimas Millin making appt. Seeing cardiologist  DR Candis Musa Feb 3

## 2018-08-24 NOTE — Progress Notes (Signed)
Hematology/Oncology Consult note [facility]  Telephone:([phone] [phone]  Patient Care Team: [name], MD as PCP - General (Family Medicine) Bary Castilla, Forest Gleason, MD as Consulting Physician (General Surgery) [name], MD as Referring Physician (Internal Medicine) [name], MD as Consulting Physician (Cardiology)   Name of the patient: William Taylor  [id number]  1929-12-02   Date of visit: 08/24/18  Diagnosis- cold agglutinin hemolytic anemia  Chief complaint/ Reason for visit-routine follow-up of cold agglutinin hemolytic anemia  Heme/Onc history: patient is a 85 year old male with a past medical history significant for prostate cancer for which he follows up with urology. He also has chronic back pain,hypertension and stage III CKD hyperlipidemia and A. fib among other medical problems. He has been referred to [place] for evaluation and management of anemia. Recent CBC from 02/16/2018 showed white count of 6.1, H&H of 10.7/30.8 with an MCV of 99.9. He was found to have coldagglutinin inhis peripheral blood. TSH was within normal limits.B12 and folate were within normal limits.His hemoglobin was 10.7 about 4 months ago but about 1 year ago his hemoglobin was 12.9.  Patient reports ongoing fatigue over the last 1 year. His appetite is good and his weight has been stable. He denies any lumps or bumps anywhere or drenching night sweats. Patient does report ongoing left-sided groin pain. He did have an ultrasound which did not reveal any adenopathy or mass. Also reports chronic back pain but denies any other acute joint pain or joint swelling. No skin rash.  Results of blood work from 03/05/2018 were as follows: CBC showed white count of 6.5, H&H of 11.9/35 with an MCV of 101 and a platelet count of 228. CMP was normal except for a mildly elevated creatinine of 1.3. LDH was normal at 122 reticulocyte count was low  normal at 1.9. Multiple myeloma panel did not reveal any M protein serum immunofixation was normal. Free light chain showed mildly elevated kappa of 23.8 with a normal kappa lambda light chain ratio of 1.5. Haptoglobin was low at 21 cold agglutinin titer was positive at 1 is to 1024 Coombs test was positive for complement and negative for IgG smear review showed macrocytic anemia with rouleaux formation. Negative for schistocytes iron studies were within normal limits  CT chest abdomen and pelvis in November 2019 showed nonspecific 4 mm left lower lobe lung nodule.  Prostatomegaly.  No evidence of malignancy  Interval history-patient was started on Imdur for his ischemic heart disease but he had significant headache with this and was switched to a different medication.  He reports chronic fatigue and some mild shortness of breath but denies any new complaints at this time  ECOG PS- 2 Pain scale- 0 Opioid associated constipation- no  Review of systems- Review of Systems  Constitutional: Positive for malaise/fatigue. Negative for chills, fever and weight loss.  HENT: Negative for congestion, ear discharge and nosebleeds.   Eyes: Negative for blurred vision.  Respiratory: Positive for shortness of breath. Negative for cough, hemoptysis, sputum production and wheezing.   Cardiovascular: Negative for chest pain, palpitations, orthopnea and claudication.  Gastrointestinal: Negative for abdominal pain, blood in stool, constipation, diarrhea, heartburn, melena, nausea and vomiting.  Genitourinary: Negative for dysuria, flank pain, frequency, hematuria and urgency.  Musculoskeletal: Negative for back pain, joint pain and myalgias.  Skin: Negative for rash.  Neurological: Negative for dizziness, tingling, focal weakness, seizures, weakness and headaches.  Endo/Heme/Allergies: Does not bruise/bleed easily.  Psychiatric/Behavioral: Negative  for depression and suicidal ideas. The patient does not have  insomnia.       Allergies  Allergen Reactions  . Bee Venom Swelling  . Sulfasalazine Hives and Swelling  . Sulfa Antibiotics Hives and Swelling  . Warfarin And Related Other (See Comments)    Chest pain     Past Medical History:  Diagnosis Date  . Cancer [facility])    Prostate, followed by Dr. Jacqlyn Larsen  . Colon polyp   . Coronary artery disease   . Hyperlipidemia   . Hypertension   . MI (myocardial infarction) ([place])    2015  . PAF (paroxysmal atrial fibrillation) ([place])    a. on xarelto  . Skin cancer      Past Surgical History:  Procedure Laterality Date  . CARDIAC CATHETERIZATION  10/2013   [facility]  . CATARACT EXTRACTION  Oct. 3, 2012   right eye  . colonoscopy    . COLONOSCOPY W/ POLYPECTOMY  2015   Dr Rayann Heman  . COLONOSCOPY WITH PROPOFOL N/A 08/15/2016   Procedure: COLONOSCOPY WITH PROPOFOL;  Surgeon: [name], MD;  Location: [facility] ENDOSCOPY;  Service: Endoscopy;  Laterality: N/A;  . CORONARY ANGIOPLASTY  2009   2005; s/p stent  . ESOPHAGOGASTRODUODENOSCOPY (EGD) WITH PROPOFOL N/A 08/15/2016   Procedure: ESOPHAGOGASTRODUODENOSCOPY (EGD) WITH PROPOFOL;  Surgeon: [name], MD;  Location: [facility] ENDOSCOPY;  Service: Endoscopy;  Laterality: N/A;  . GIVENS CAPSULE STUDY N/A 09/26/2016   Procedure: GIVENS CAPSULE STUDY;  Surgeon: [name], MD;  Location: [facility] ENDOSCOPY;  Service: Endoscopy;  Laterality: N/A;  . HERNIA REPAIR  2013  . LUMBAR SPINE SURGERY    . UPPER GI ENDOSCOPY  Sept 2015   Dr Rayann Heman    Social History   Socioeconomic History  . Marital status: Married    Spouse name: Not on file  . Number of children: Not on file  . Years of education: Not on file  . Highest education level: Not on file  Occupational History  . Not on file  Social Needs  . Financial resource strain: Not on file  . Food insecurity:    Worry: Not on file    Inability: Not on file  . Transportation needs:    Medical: Not on file    Non-medical: Not on file  Tobacco Use  . Smoking  status: Never Smoker  . Smokeless tobacco: Never Used  Substance and Sexual Activity  . Alcohol use: No  . Drug use: No  . Sexual activity: Not Currently  Lifestyle  . Physical activity:    Days per week: Not on file    Minutes per session: Not on file  . Stress: Not on file  Relationships  . Social connections:    Talks on phone: Not on file    Gets together: Not on file    Attends religious service: Not on file    Active member of club or organization: Not on file    Attends meetings of clubs or organizations: Not on file    Relationship status: Not on file  . Intimate partner violence:    Fear of current or ex partner: Not on file    Emotionally abused: Not on file    Physically abused: Not on file    Forced sexual activity: Not on file  Other Topics Concern  . Not on file  Social History Narrative  . Not on file    Family History  Problem Relation Age of Onset  . Stroke Mother   .  Colon cancer Father   . Coronary artery disease Brother   . Other Brother 83       lymes dz     Current Outpatient Medications:  .  amiodarone (PACERONE) 200 MG tablet, TAKE 1 TABLET BY MOUTH  DAILY AS NEEDED, Disp: 90 tablet, Rfl: 2 .  diltiazem (CARDIZEM CD) 120 MG 24 hr capsule, TAKE 1 CAPSULE BY MOUTH  DAILY, Disp: 90 capsule, Rfl: 2 .  esomeprazole (NEXIUM) 40 MG capsule, TAKE 1 CAPSULE BY MOUTH  DAILY AT 12 NOON, Disp: 90 capsule, Rfl: 3 .  fenofibrate micronized (ANTARA) 130 MG capsule, TAKE 1 CAPSULE BY MOUTH  DAILY BEFORE BREAKFAST, Disp: 90 capsule, Rfl: 1 .  finasteride (PROSCAR) 5 MG tablet, Take 1 tablet (5 mg total) by mouth daily., Disp: 90 tablet, Rfl: 2 .  levobunolol (BETAGAN) 0.5 % ophthalmic solution, Place 1 drop into the right eye 2 (two) times daily. , Disp: , Rfl:  .  Omega-3 Fatty Acids (FISH OIL) 1000 MG CAPS, Take 1,000 mg by mouth daily., Disp: , Rfl:  .  polyethylene glycol powder (GLYCOLAX/MIRALAX) powder, Take 17 g by mouth 2 (two) times daily as needed.,  Disp: 3350 g, Rfl: 1 .  pravastatin (PRAVACHOL) 40 MG tablet, TAKE 1 TABLET BY MOUTH AT  BEDTIME, Disp: 90 tablet, Rfl: 3 .  ranolazine (RANEXA) 500 MG 12 hr tablet, Take 1 tablet (500 mg total) by mouth 2 (two) times daily., Disp: 60 tablet, Rfl: 3 .  tamsulosin (FLOMAX) 0.4 MG CAPS capsule, Take 1 capsule (0.4 mg total) by mouth daily., Disp: 30 capsule, Rfl: 11 .  XARELTO 20 MG TABS tablet, TAKE 1 TABLET DAILY, Disp: 90 tablet, Rfl: 1 .  cyproheptadine (PERIACTIN) 4 MG tablet, Take 1 tablet (4 mg total) by mouth 3 (three) times daily as needed for allergies. (Patient not taking: Reported on 08/24/2018), Disp: 90 tablet, Rfl: 3 .  metoprolol tartrate (LOPRESSOR) 25 MG tablet, Take 0.5 tablets (12.5 mg total) by mouth 2 (two) times daily. (Patient not taking: Reported on 08/24/2018), Disp: 90 tablet, Rfl: 1 .  nitroGLYCERIN (NITROSTAT) 0.4 MG SL tablet, DISSOLVE UNDER THE TONGUE 1 TABLET EVERY 5 MINUTES AS NEEDED FOR CHEST PAIN (Patient not taking: Reported on 08/24/2018), Disp: 25 tablet, Rfl: 3  Physical exam:  Vitals:   08/24/18 1050  BP: (!) 156/96  Pulse: 86  Resp: 18  Temp: (!) 95.7 F (35.4 C)  TempSrc: Tympanic  Weight: 214 lb 12.8 oz (97.4 kg)   Physical Exam Constitutional:      Comments: Frail elderly gentleman in no acute distress  HENT:     Head: Normocephalic and atraumatic.  Eyes:     Pupils: Pupils are equal, round, and reactive to light.  Neck:     Musculoskeletal: Normal range of motion.  Cardiovascular:     Rate and Rhythm: Normal rate and regular rhythm.     Heart sounds: Normal heart sounds.  Pulmonary:     Effort: Pulmonary effort is normal.     Breath sounds: Normal breath sounds.  Abdominal:     General: Bowel sounds are normal.     Palpations: Abdomen is soft.  Skin:    General: Skin is warm and dry.  Neurological:     Mental Status: He is alert and oriented to person, place, and time.      CMP Latest Ref Rng & Units 08/24/2018  Glucose 70 - 99 mg/dL  107(H)  BUN 8 - 23 mg/dL 26(H)  Creatinine 0.61 - 1.24 mg/dL 1.50(H)  Sodium 135 - 145 mmol/L 139  Potassium 3.5 - 5.1 mmol/L 3.9  Chloride 98 - 111 mmol/L 104  CO2 22 - 32 mmol/L 26  Calcium 8.9 - 10.3 mg/dL 9.4  Total Protein 6.5 - 8.1 g/dL 7.2  Total Bilirubin 0.3 - 1.2 mg/dL 1.8(H)  Alkaline Phos 38 - 126 U/L 30(L)  AST 15 - 41 U/L 28  ALT 0 - 44 U/L 19   CBC Latest Ref Rng & Units 08/24/2018  WBC 4.0 - 10.5 K/uL 9.8  Hemoglobin 13.0 - 17.0 g/dL 11.8(L)  Hematocrit 39.0 - 52.0 % 35.1(L)  Platelets 150 - 400 K/uL 284      Assessment and plan- Patient is a 85 y.o. male with cold agglutinin hemolytic anemia currently under observation  Patient's hemoglobin reported as 11.8 today which I am not sure if it is a true value or a spuriously elevated value.  Previously his hemoglobin was around 10.  His total bilirubin is still elevated at 1.8 today.  Reticulocyte count is not elevated at this time.  Haptoglobin is currently pending.  I have recommended that patient should try to stay as warm as possible as cold weather can precipitate hemolysis further.  At this time he does not require any treatment for his hemolytic anemia unless it worsens and trends down to less than 9.  Repeat CBC with differential in 1 month in 2 months and I will see him back in 2 months.  His creatinine is elevated at 1.5 which is little higher than his baseline and I will inform Dr. Caryl Bis about this.  Patient also has a history of ischemic cardiomyopathy for which she follows up with cardiology   Visit Diagnosis 1. Cold agglutinin disease ([place])      Dr. Randa Evens, MD, MPH [facility] at [facility] [phone] 08/24/2018 1:16 PM

## 2018-08-25 ENCOUNTER — Ambulatory Visit: Payer: Self-pay | Admitting: *Deleted

## 2018-08-25 LAB — HAPTOGLOBIN: Haptoglobin: <10 mg/dL — ABNORMAL LOW (ref 38–329)

## 2018-08-25 LAB — COLD AGGLUTININ TITER: Cold Agglutinin Titer: 1:4096 {titer}

## 2018-08-25 NOTE — Telephone Encounter (Signed)
Pt called with having some dizziness when he stands, and b/p dropping at times. He went to see his oncologist  yesterday and had blood work done, hgb is up to 2 points per pt. He reported being taking off of some of his b/p medication because of low b/p readings, by his cardiologist. He states that he has not been able to eat much. He has been nauseated a couple of times but has not vomited. Sometimes with the dizziness he feel like he is going to pass out when he stands up. His b/p right now is 122/76 and hr is 76.  He also states he has pains on his left side and abd for the about a year. Has had scans but can not find anything wrong. Denies being dehydrated, drinking lots of fluids. Request an appointment with his provider. The earliest appointment has been scheduled for Thursday. Advised to call back for increase in his symptoms, nausea with vomiting, weakness, sweating, shortness of breath and b/p elevated or lower than normal. Pt voiced understanding. Routing to [facility] at [facility] for review. Pt was triaged also on 08/21/18.  Reason for Disposition . [1] MILD dizziness (e.g., walking normally) AND [2] has NOT been evaluated by physician for this  (Exception: dizziness caused by heat exposure, sudden standing, or poor fluid intake)  Answer Assessment - Initial Assessment Questions 1. DESCRIPTION: "Describe your dizziness."     lightheaded 2. LIGHTHEADED: "Do you feel lightheaded?" (e.g., somewhat faint, woozy, weak upon standing)     Yes when standing 3. VERTIGO: "Do you feel like either you or the room is spinning or tilting?" (i.e. vertigo)     no 4. SEVERITY: "How bad is it?"  "Do you feel like you are going to faint?" "Can you stand and walk?"   - MILD - walking normally   - MODERATE - interferes with normal activities (e.g., work, school)    - SEVERE - unable to stand, requires support to walk, feels like passing out now.      mild 5. ONSET:  "When did the dizziness  begin?"     About a year ago 6. AGGRAVATING FACTORS: "Does anything make it worse?" (e.g., standing, change in head position)     standing 7. HEART RATE: "Can you tell me your heart rate?" "How many beats in 15 seconds?"  (Note: not all patients can do this)       74 8. CAUSE: "What do you think is causing the dizziness?"     Not sure 9. RECURRENT SYMPTOM: "Have you had dizziness before?" If so, ask: "When was the last time?" "What happened that time?"     Yes and had a stress test 10. OTHER SYMPTOMS: "Do you have any other symptoms?" (e.g., fever, chest pain, vomiting, diarrhea, bleeding)       Had some nausea at times 11. PREGNANCY: "Is there any chance you are pregnant?" "When was your last menstrual period?"       n/a  Protocols used: DIZZINESS [facility]

## 2018-08-26 NOTE — Telephone Encounter (Signed)
Sent to PCP please advise.  

## 2018-08-26 NOTE — Telephone Encounter (Signed)
Noted.  Patient should keep his appointment with Lauren tomorrow.

## 2018-08-27 ENCOUNTER — Encounter: Payer: Self-pay | Admitting: Family Medicine

## 2018-08-27 ENCOUNTER — Other Ambulatory Visit: Payer: Medicare HMO

## 2018-08-27 ENCOUNTER — Ambulatory Visit: Payer: Medicare HMO | Admitting: Family Medicine

## 2018-08-27 VITALS — BP 132/78 | HR 67 | Temp 98.0°F | Resp 16 | Ht 76.0 in | Wt 212.0 lb

## 2018-08-27 DIAGNOSIS — I10 Essential (primary) hypertension: Secondary | ICD-10-CM

## 2018-08-27 DIAGNOSIS — R630 Anorexia: Secondary | ICD-10-CM

## 2018-08-27 DIAGNOSIS — I951 Orthostatic hypotension: Secondary | ICD-10-CM | POA: Diagnosis not present

## 2018-08-27 DIAGNOSIS — E039 Hypothyroidism, unspecified: Secondary | ICD-10-CM

## 2018-08-27 DIAGNOSIS — I480 Paroxysmal atrial fibrillation: Secondary | ICD-10-CM

## 2018-08-27 DIAGNOSIS — R110 Nausea: Secondary | ICD-10-CM

## 2018-08-27 DIAGNOSIS — R531 Weakness: Secondary | ICD-10-CM

## 2018-08-27 LAB — COMPREHENSIVE METABOLIC PANEL
ALBUMIN: 4.1 g/dL (ref 3.5–5.2)
ALT: 15 U/L (ref 0–53)
AST: 23 U/L (ref 0–37)
Alkaline Phosphatase: 22 U/L — ABNORMAL LOW (ref 39–117)
BUN: 22 mg/dL (ref 6–23)
CO2: 26 mEq/L (ref 19–32)
Calcium: 9.7 mg/dL (ref 8.4–10.5)
Chloride: 101 mEq/L (ref 96–112)
Creatinine, Ser: 1.4 mg/dL (ref 0.40–1.50)
GFR: 50.98 mL/min — ABNORMAL LOW (ref >60.00)
Glucose, Bld: 112 mg/dL — ABNORMAL HIGH (ref 70–99)
Potassium: 4.2 mEq/L (ref 3.5–5.1)
Sodium: 136 mEq/L (ref 135–145)
Total Bilirubin: 1.4 mg/dL — ABNORMAL HIGH (ref 0.2–1.2)
Total Protein: 6.5 g/dL (ref 6.0–8.3)

## 2018-08-27 LAB — CBC
HEMATOCRIT: 33.6 % — AB (ref 38.5–50.0)
Hemoglobin: 11.5 g/dL — ABNORMAL LOW (ref 13.2–17.1)
MCH: 35.1 pg — ABNORMAL HIGH (ref 27.0–33.0)
MCHC: 34.2 g/dL (ref 32.0–36.0)
MCV: 102.4 fL — AB (ref 80.0–100.0)
MPV: 10.1 fL (ref 7.5–12.5)
Platelets: 268 10*3/uL (ref 140–400)
RBC: 3.28 10*6/uL — ABNORMAL LOW (ref 4.20–5.80)
RDW: 12.4 % (ref 11.0–15.0)
WBC: 8.1 10*3/uL (ref 3.8–10.8)

## 2018-08-27 LAB — TSH: TSH: 7.06 u[IU]/mL — ABNORMAL HIGH (ref 0.35–4.50)

## 2018-08-27 LAB — AMYLASE: AMYLASE: 18 U/L — AB (ref 27–131)

## 2018-08-27 LAB — LIPASE: Lipase: 15 U/L (ref 11.0–59.0)

## 2018-08-27 MED ORDER — ONDANSETRON 4 MG PO TBDP: 4.0000 mg | ORAL_TABLET | Freq: Three times a day (TID) | ORAL | 2 refills | Status: DC | PRN

## 2018-08-27 NOTE — Progress Notes (Signed)
Subjective:    Patient ID: William Taylor, male    DOB: 26-Mar-1930, 85 y.o.   MRN: [id number]  HPI  Presents to clinic c/o dizziness and low BP at times.  Reviewed cardiology note from 08/17/2018 and A&P copied from note reads as follows: ASSESSMENT AND PLAN:  1. CAD involving the native coronary arteries without angina: No further symptoms concerning for angina/chest pain.  Continue current therapy with metoprolol, Cardizem, pravastatin, and Ranexa.  He was intolerant to isosorbide mononitrate secondary to headache.  His orthostatic hypotension precludes further escalation of metoprolol at this time.  No plans for further ischemic evaluation at this time.  2. PAF: He is maintaining sinus rhythm.  Discussed with the patient some of his symptoms certainly may be related to amiodarone adverse effects.  We have decided to maintain him on amiodarone while we taper some of his antihypertensives as outlined below given his relative hypotension with orthostatic dizziness.  Remains on Xarelto.  3. History of PE: On chronic Xarelto therapy as above.  4. Orthostatic hypotension: This has been a longstanding issue for him.  We have discontinued his lisinopril and decreased his Lopressor to 12.5 mg twice daily.  Recommend he maintain adequate hydration. **Patient states his heart rate "went crazy" and he went back to 25 mg of Lopressor twice daily on his own.  5. Hypertension: Blood pressure on the soft side today.  Discontinue lisinopril and taper Lopressor as above.  6. Cold agglutinin hemolytic anemia: Followed by hematology.   Patient also c/o nausea and feeling that his food is not going down. States he has no appetite and feels not well almost all of the time. States he will have episodes of low BP, nausea and dizziness and will have to go lay down until he feels better. Did go out to dinner for NYE with wife, had a great time and states "I felt like my normal self", but then the next day  he felt unwell again.  He did have CT of abdomen and pelvis in 06/2018 to investigate complaints of ABD pain. CT report reviewed and nothing suspicious seen in scan other than enlarged prostate (hx of prostate CA, follows with urology) and pulmonary nodule that will be monitored.   Recent labs reviewed: CBC Latest Ref Rng & Units 08/24/2018 07/30/2018 07/01/2018  WBC 4.0 - 10.5 K/uL 9.8 6.6 9.2  Hemoglobin 13.0 - 17.0 g/dL 11.8(L) 9.9(L) 10.6(L)  Hematocrit 39.0 - 52.0 % 35.1(L) 30.0(L) 31.3(L)  Platelets 150 - 400 K/uL 284 224 226   BMP Latest Ref Rng & Units 08/24/2018 07/30/2018 07/01/2018  Glucose 70 - 99 mg/dL 107(H) 125(H) 105(H)  BUN 8 - 23 mg/dL 26(H) 25(H) 32(H)  Creatinine 0.61 - 1.24 mg/dL 1.50(H) 1.29(H) 1.29(H)  Sodium 135 - 145 mmol/L 139 137 137  Potassium 3.5 - 5.1 mmol/L 3.9 4.1 5.2(H)  Chloride 98 - 111 mmol/L 104 108 105  CO2 22 - 32 mmol/L 26 24 25   Calcium 8.9 - 10.3 mg/dL 9.4 8.9 9.3    Patient Active Problem List   Diagnosis Date Noted  . Orthostasis 06/06/2018  . Chronic pain of left knee 06/05/2018  . Chronic pain in testicle 06/05/2018  . Chronic back pain 02/17/2018  . Chest pain 03/07/2017  . Angiodysplasia of intestinal tract   . Iron deficiency anemia   . Benign neoplasm of ascending colon   . Diverticulosis of large intestine without diverticulitis   . Gastric polyp   . Left rotator cuff tear  arthropathy 09/11/2015  . Fatigue 04/15/2014  . Groin pain, chronic, left 01/18/2014  . Prostate cancer ([place]) 03/15/2013  . DOE (dyspnea on exertion) 11/23/2012  . Insomnia 09/03/2012  . Hypertension 07/23/2012  . Chronic kidney disease (CKD), stage III (moderate) ([place]) 07/23/2011  . Pulmonary embolism ([place]) 01/09/2011  . Paroxysmal A-fib ([place]) 09/06/2010  . Hyperlipidemia 05/19/2009  . Coronary artery disease of native artery of native heart with stable angina pectoris ([place]) 05/19/2009   Social History   Tobacco Use  . Smoking status: Never Smoker  .  Smokeless tobacco: Never Used  Substance Use Topics  . Alcohol use: No   Review of Systems  Constitutional: Negative for chills, fatigue and fever.  HENT: Negative for congestion, ear pain, sinus pain and sore throat.   Eyes: Negative.   Respiratory: Negative for cough, shortness of breath and wheezing.   Cardiovascular: Negative for chest pain, palpitations and leg swelling.+low BP  Gastrointestinal: +nausea, ABD pain, constipation.  Genitourinary: Negative for dysuria, frequency and urgency.  Musculoskeletal: Negative for arthralgias and myalgias.  Skin: Negative for color change, pallor and rash.  Neurological: Negative for syncope, headaches. +dizziness Psychiatric/Behavioral: The patient is not nervous/anxious.       Objective:   Physical Exam  Constitutional: He is oriented to person, place, and time. He appears well-developed and well-nourished. No distress.  HENT:  Head: Normocephalic and atraumatic.  Eyes: Conjunctivae and EOM are normal. No scleral icterus.  Neck: Normal range of motion. Neck supple. No tracheal deviation present.  Cardiovascular: Normal rate, regular rhythm and normal heart sounds.  Pulmonary/Chest: Effort normal and breath sounds normal. No respiratory distress. He has no wheezes. He has no rales.  Abdominal: Soft. Bowel sounds are normal. Generalized discomfort with palpation. Neurological: He is alert and oriented to person, place, and time.  Gait normal  Skin: Skin is warm and dry. He is not diaphoretic. No pallor.  Psychiatric: He has a normal mood and affect. His behavior is normal. Thought content normal.   Nursing note and vitals reviewed.   Wt Readings from Last 3 Encounters:  08/27/18 212 lb (96.2 kg)  08/24/18 214 lb 12.8 oz (97.4 kg)  08/17/18 217 lb 8 oz (98.7 kg)   Vitals:   08/27/18 0806  BP: 132/78  Pulse: 67  Resp: 16  Temp: 98 F (36.7 C)  SpO2: 97%   BP Readings from Last 3 Encounters:  08/27/18 132/78  08/24/18 (!)  156/96  08/17/18 100/60       Assessment & Plan:   Essential hypertension/orthostatic hypotension/atrial fibrillation - blood pressure medication adjustments were made recently by cardiology at the end of December.  Advised to continue to not take the lisinopril.  BP looks stable even with continuing to take Lopressor 25 mg twice daily; cardiology had advised to decrease to 12.5 twice daily, but patient states his heart rate could not handle the decrease.  Patient advised to keep himself well-hydrated and change positions slowly.  Nausea/no appetite - patient will trial taking Zofran 4 mg as needed 30 minutes before a meal to see if this helps reduce nausea and increases his appetite.  We will also get lab work in clinic today including CBC, CMP, amylase, lipase. Will do GI referral for further evaluation.  Weakness - Will also check TSH in lab work today.   Patient aware he will be contacted in regards to GI appointment.  Advised to keep regularly scheduled follow-up with oncology, cardiology, PCP as planned.

## 2018-08-28 ENCOUNTER — Telehealth: Payer: Self-pay | Admitting: *Deleted

## 2018-08-28 MED ORDER — LEVOTHYROXINE SODIUM 25 MCG PO CAPS: 25.0000 ug | ORAL_CAPSULE | Freq: Every day | ORAL | 2 refills | Status: DC

## 2018-08-28 NOTE — Telephone Encounter (Signed)
Sent to [employer]

## 2018-08-28 NOTE — Telephone Encounter (Signed)
Patient in office to see Lauren.

## 2018-08-28 NOTE — Addendum Note (Signed)
Addended by: Philis Nettle on: 08/28/2018 01:59 PM   Modules accepted: Orders

## 2018-08-28 NOTE — Telephone Encounter (Signed)
Called and spoke with patient's wife. Wife stated that her husband is NOT doing well and still feeling very dizzy and nauseated. They did get the Zofran and that did seem to help some however, she is concern since at the last OV no one looked or check out his ears. Pt's wife wanted to know if he could just stop by today for either Lauren Guse or [place] to look at his ears.

## 2018-08-28 NOTE — Telephone Encounter (Signed)
He can come by and have him put into nurse visit room, I will look in the ears -- I think a lot of his symptoms are related to his multiple chronic issues out lined in note yesterday and we did discover he is hypothyroid in his labs  He has pending GI appt also planned this month

## 2018-08-28 NOTE — Telephone Encounter (Signed)
Copied from [place] [phone]. Topic: General - Inquiry >> Aug 28, 2018 10:32 AM Margot Ables wrote: Reason for CRM: pt wife called stating someone called William Taylor this morning about results and were sending in a medication. I do not see a medication from today. They did pick up zofran ordered yesterday. She said he asked about being really dizzy again this morning and that yesterday no one checked his ears. He asked if someone could check his ears today (per wife William Taylor). Please call pt to advise of medication and having ears checked today.

## 2018-08-31 ENCOUNTER — Other Ambulatory Visit: Payer: Self-pay | Admitting: Family Medicine

## 2018-08-31 DIAGNOSIS — R42 Dizziness and giddiness: Secondary | ICD-10-CM | POA: Diagnosis not present

## 2018-09-02 ENCOUNTER — Other Ambulatory Visit: Payer: Self-pay | Admitting: Physician Assistant

## 2018-09-02 DIAGNOSIS — R0600 Dyspnea, unspecified: Secondary | ICD-10-CM

## 2018-09-03 ENCOUNTER — Telehealth: Payer: Self-pay

## 2018-09-03 NOTE — Telephone Encounter (Signed)
I spoke with the patient. He states he has been in bed for about 12 days with his symptoms of dizziness/ nausea, but he is up and moving more now.  I inquired if he felt like he had had a virus/ flu recently and he denied this. He states he has been followed by several different doctors recently including his PCP and hematology and everyone keeps adding medications. I advised sometimes all the medications together can worsen the symptoms that he initially had. The patient is pending staring on thyroid replacement. He states he thought his PCP sent the RX to [facility] as he has not received this yet. I reviewed the patient's chart and advised him that Philis Nettle, NP sent in levothyroxine 25 mcg once daily to [facility] on 08/28/18. I have advised the patient to touch base with [facility]. He confirms he is staying hydrated. I asked that he keep his follow echo/ OV scheduled with [place], but if he is stable, we will make no other recommendations at this time.  The patient voices understanding.

## 2018-09-03 NOTE — Telephone Encounter (Signed)
Called patients wife to verify medications for refill with her.   Patients wife Ivin Booty stated that patient has been very dizzy, nauseated, and has not been able to stay up. She stated that he does have an appointment with Dr. Rockey Situ 09/21/2018 and is scheduled for an ECHO Thursday 09/10/2018.

## 2018-09-05 ENCOUNTER — Emergency Department
Admission: EM | Admit: 2018-09-05 | Discharge: 2018-09-05 | Disposition: A | Discharge status: Home / Self Care | Payer: Medicare HMO | Attending: Emergency Medicine | Admitting: Emergency Medicine

## 2018-09-05 ENCOUNTER — Other Ambulatory Visit: Payer: Self-pay

## 2018-09-05 ENCOUNTER — Encounter: Payer: Self-pay | Admitting: Emergency Medicine

## 2018-09-05 ENCOUNTER — Emergency Department: Payer: Medicare HMO

## 2018-09-05 DIAGNOSIS — I2510 Atherosclerotic heart disease of native coronary artery without angina pectoris: Secondary | ICD-10-CM | POA: Insufficient documentation

## 2018-09-05 DIAGNOSIS — Z79899 Other long term (current) drug therapy: Secondary | ICD-10-CM | POA: Insufficient documentation

## 2018-09-05 DIAGNOSIS — R1032 Left lower quadrant pain: Secondary | ICD-10-CM | POA: Diagnosis present

## 2018-09-05 DIAGNOSIS — I10 Essential (primary) hypertension: Secondary | ICD-10-CM | POA: Diagnosis not present

## 2018-09-05 DIAGNOSIS — R911 Solitary pulmonary nodule: Secondary | ICD-10-CM | POA: Diagnosis not present

## 2018-09-05 DIAGNOSIS — G8929 Other chronic pain: Secondary | ICD-10-CM | POA: Diagnosis not present

## 2018-09-05 DIAGNOSIS — I4891 Unspecified atrial fibrillation: Secondary | ICD-10-CM | POA: Insufficient documentation

## 2018-09-05 DIAGNOSIS — R1030 Lower abdominal pain, unspecified: Secondary | ICD-10-CM | POA: Diagnosis not present

## 2018-09-05 DIAGNOSIS — K5730 Diverticulosis of large intestine without perforation or abscess without bleeding: Secondary | ICD-10-CM | POA: Diagnosis not present

## 2018-09-05 LAB — URINALYSIS, COMPLETE (UACMP) WITH MICROSCOPIC
Bacteria, UA: NONE SEEN
Bilirubin Urine: NEGATIVE
Glucose, UA: NEGATIVE mg/dL
Hgb urine dipstick: NEGATIVE
Ketones, ur: NEGATIVE mg/dL
Leukocytes, UA: NEGATIVE
Nitrite: NEGATIVE
Protein, ur: NEGATIVE mg/dL
Specific Gravity, Urine: 1.017 (ref 1.005–1.030)
Squamous Epithelial / HPF: NONE SEEN (ref 0–5)
pH: 6 (ref 5.0–8.0)

## 2018-09-05 LAB — CBC
HCT: 33.5 % — ABNORMAL LOW (ref 39.0–52.0)
Hemoglobin: 11.3 g/dL — ABNORMAL LOW (ref 13.0–17.0)
MCH: 34.3 pg — AB (ref 26.0–34.0)
MCHC: 33.7 g/dL (ref 30.0–36.0)
MCV: 101.8 fL — ABNORMAL HIGH (ref 80.0–100.0)
PLATELETS: 287 10*3/uL (ref 150–400)
RBC: 3.29 MIL/uL — AB (ref 4.22–5.81)
RDW: 13.5 % (ref 11.5–15.5)
WBC: 7.8 10*3/uL (ref 4.0–10.5)
nRBC: 0 % (ref 0.0–0.2)

## 2018-09-05 LAB — BASIC METABOLIC PANEL
Anion gap: 8 (ref 5–15)
BUN: 25 mg/dL — ABNORMAL HIGH (ref 8–23)
CO2: 24 mmol/L (ref 22–32)
Calcium: 9.1 mg/dL (ref 8.9–10.3)
Chloride: 103 mmol/L (ref 98–111)
Creatinine, Ser: 1.44 mg/dL — ABNORMAL HIGH (ref 0.61–1.24)
GFR calc Af Amer: 51 mL/min — ABNORMAL LOW (ref >60)
GFR calc non Af Amer: 44 mL/min — ABNORMAL LOW (ref >60)
Glucose, Bld: 131 mg/dL — ABNORMAL HIGH (ref 70–99)
Potassium: 4 mmol/L (ref 3.5–5.1)
Sodium: 135 mmol/L (ref 135–145)

## 2018-09-05 MED ORDER — SODIUM CHLORIDE 0.9% FLUSH: 3.0000 mL | Freq: Once | INTRAVENOUS | Status: DC

## 2018-09-05 NOTE — ED Triage Notes (Signed)
Pt reports has been having groin pain since 08/20/2018, pt reports dizziness, light headedness since December. Denies any falls pt talks in complete sentences no distress noted

## 2018-09-05 NOTE — ED Notes (Signed)
ED Provider at bedside. 

## 2018-09-05 NOTE — ED Notes (Signed)
Pt changed into gown, wife at bedside

## 2018-09-05 NOTE — ED Notes (Signed)
Patient transported to CT 

## 2018-09-05 NOTE — ED Notes (Addendum)
Pt reports sharp constant 6/10 pain in left groin that started this morning, denies heavy lifting, reports hx of hernia repair approx 3 years prior, reports feeling a lump in that that area  Pt takes blood thinners for "blockages around heart", hx of 2 x stents  Pt also c/o dizziness for a month - for which tests are in progress with PCP, left abdomen pain for nearly a year

## 2018-09-05 NOTE — ED Notes (Signed)
No peripheral IV placed this visit.   Discharge instructions reviewed with patient. Questions fielded by this RN. Patient verbalizes understanding of instructions. Patient discharged home in stable condition per [facility]. No acute distress noted at time of discharge.   Pt wheeled to wife's car and loaded

## 2018-09-05 NOTE — ED Provider Notes (Addendum)
[facility] Emergency Department Provider Note  ____________________________________________   I have reviewed the triage vital signs and the nursing notes. Where available I have reviewed prior notes and, if possible and indicated, outside hospital notes.    HISTORY  Chief Complaint Groin Pain (left side )    HPI William Taylor is a 85 y.o. male  With a history of MI, atrial fibrillation on Xarelto, prostate cancer CAD, what is described in the chart is "chronic left-sided groin pain" history of anxiety especially about various pains, whom I have personally seen in the past, resents today with his left inguinal pain which is been going on for months.  He states it got worse however yesterday.  It feels a little better at this time.  Patient has had this since he states a long time ago.  Review of notes show that he had an ultrasound in October of his scrotum, and a CT scan in November, patient complains today of this pain in the inguinal region.  He has a difficult time showing me exactly where it is.  He thinks nothing makes it better nothing makes it worse has had normal urination normal bowel movements and no other complaints.  He states he always feels "lightheaded" and has been feeling lightheaded since before Christmas, that has not changed with this complaint   Past Medical History:  Diagnosis Date  . Cancer [facility])    Prostate, followed by Dr. Jacqlyn Larsen  . Colon polyp   . Coronary artery disease   . Hyperlipidemia   . Hypertension   . MI (myocardial infarction) ([place])    2015  . PAF (paroxysmal atrial fibrillation) ([place])    a. on xarelto  . Skin cancer     Patient Active Problem List   Diagnosis Date Noted  . Orthostatic hypotension 06/06/2018  . Chronic pain of left knee 06/05/2018  . Chronic pain in testicle 06/05/2018  . Chronic back pain 02/17/2018  . Chest pain 03/07/2017  . Angiodysplasia of intestinal tract   . Iron deficiency anemia   .  Benign neoplasm of ascending colon   . Diverticulosis of large intestine without diverticulitis   . Gastric polyp   . Left rotator cuff tear arthropathy 09/11/2015  . Fatigue 04/15/2014  . Groin pain, chronic, left 01/18/2014  . Prostate cancer ([place]) 03/15/2013  . DOE (dyspnea on exertion) 11/23/2012  . Insomnia 09/03/2012  . Essential hypertension 07/23/2012  . Chronic kidney disease (CKD), stage III (moderate) ([place]) 07/23/2011  . Pulmonary embolism ([place]) 01/09/2011  . Paroxysmal A-fib ([place]) 09/06/2010  . Hyperlipidemia 05/19/2009  . Coronary artery disease of native artery of native heart with stable angina pectoris ([place]) 05/19/2009    Past Surgical History:  Procedure Laterality Date  . CARDIAC CATHETERIZATION  10/2013   [facility]  . CATARACT EXTRACTION  Oct. 3, 2012   right eye  . colonoscopy    . COLONOSCOPY W/ POLYPECTOMY  2015   Dr Rayann Heman  . COLONOSCOPY WITH PROPOFOL N/A 08/15/2016   Procedure: COLONOSCOPY WITH PROPOFOL;  Surgeon: [name], MD;  Location: [facility] ENDOSCOPY;  Service: Endoscopy;  Laterality: N/A;  . CORONARY ANGIOPLASTY  2009   2005; s/p stent  . ESOPHAGOGASTRODUODENOSCOPY (EGD) WITH PROPOFOL N/A 08/15/2016   Procedure: ESOPHAGOGASTRODUODENOSCOPY (EGD) WITH PROPOFOL;  Surgeon: [name], MD;  Location: [facility] ENDOSCOPY;  Service: Endoscopy;  Laterality: N/A;  . GIVENS CAPSULE STUDY N/A 09/26/2016   Procedure: GIVENS CAPSULE STUDY;  Surgeon: [name], MD;  Location: [facility] ENDOSCOPY;  Service: Endoscopy;  Laterality: N/A;  . HERNIA REPAIR  2013  . LUMBAR SPINE SURGERY    . UPPER GI ENDOSCOPY  Sept 2015   Dr Rayann Heman    Prior to Admission medications   Medication Sig Start Date End Date Taking? Authorizing Provider  amiodarone (PACERONE) 200 MG tablet TAKE 1 TABLET BY MOUTH  DAILY AS NEEDED 04/14/18   [name], MD  cyproheptadine (PERIACTIN) 4 MG tablet Take 1 tablet (4 mg total) by mouth 3 (three) times daily as needed for allergies. 02/17/18   [name], MD  diltiazem (CARDIZEM CD) 120 MG 24 hr capsule TAKE 1 CAPSULE BY MOUTH  DAILY 04/15/18   [name], MD  esomeprazole (NEXIUM) 40 MG capsule TAKE 1 CAPSULE BY MOUTH  DAILY AT 12 NOON 09/01/18   [name], MD  fenofibrate micronized (ANTARA) 130 MG capsule TAKE 1 CAPSULE BY MOUTH  DAILY BEFORE BREAKFAST 08/24/18   [name], MD  finasteride (PROSCAR) 5 MG tablet Take 1 tablet (5 mg total) by mouth daily. 02/05/18   Stoioff, Ronda Fairly, MD  levobunolol (BETAGAN) 0.5 % ophthalmic solution Place 1 drop into the right eye 2 (two) times daily.     [provider]  Levothyroxine Sodium 25 MCG CAPS Take 1 capsule (25 mcg total) by mouth daily before breakfast. 08/28/18   Guse, Jacquelynn Cree, FNP  metoprolol tartrate (LOPRESSOR) 25 MG tablet Take 0.5 tablets (12.5 mg total) by mouth 2 (two) times daily. 08/17/18   Dunn, Areta Haber, PA-C  nitroGLYCERIN (NITROSTAT) 0.4 MG SL tablet DISSOLVE UNDER THE TONGUE 1 TABLET EVERY 5 MINUTES AS NEEDED FOR CHEST PAIN 06/11/16   [name], MD  Omega-3 Fatty Acids (FISH OIL) 1000 MG CAPS Take 1,000 mg by mouth daily.    [provider]  ondansetron (ZOFRAN ODT) 4 MG disintegrating tablet Take 1 tablet (4 mg total) by mouth every 8 (eight) hours as needed for nausea. 08/27/18   Guse, Jacquelynn Cree, FNP  polyethylene glycol powder (GLYCOLAX/MIRALAX) powder Take 17 g by mouth 2 (two) times daily as needed. 06/05/18   [name], MD  pravastatin (PRAVACHOL) 40 MG tablet TAKE 1 TABLET BY MOUTH AT  BEDTIME 06/08/18   [name], MD  ranolazine (RANEXA) 500 MG 12 hr tablet Take 1 tablet (500 mg total) by mouth 2 (two) times daily. 08/02/18   Rise Mu, PA-C  tamsulosin (FLOMAX) 0.4 MG CAPS capsule Take 1 capsule (0.4 mg total) by mouth daily. 06/15/18   Stoioff, Ronda Fairly, MD  XARELTO 20 MG TABS tablet TAKE 1 TABLET DAILY 06/12/18   [name], MD    Allergies Bee venom; Sulfasalazine; Sulfa antibiotics; and Warfarin and  related  Family History  Problem Relation Age of Onset  . Stroke Mother   . Colon cancer Father   . Coronary artery disease Brother   . Other Brother 92       lymes dz    Social History Social History   Tobacco Use  . Smoking status: Never Smoker  . Smokeless tobacco: Never Used  Substance Use Topics  . Alcohol use: No  . Drug use: No    Review of Systems Constitutional: No fever/chills Eyes: No visual changes. ENT: No sore throat. No stiff neck no neck pain Cardiovascular: Denies chest pain. Respiratory: Denies shortness of breath. Gastrointestinal:   no vomiting.  No diarrhea.  No constipation. Genitourinary: Negative for dysuria. Musculoskeletal: Negative lower extremity  swelling Skin: Negative for rash. Neurological: Negative for severe headaches, focal weakness or numbness.   ____________________________________________   PHYSICAL EXAM:  VITAL SIGNS: ED Triage Vitals  Enc Vitals Group     BP 09/05/18 1834 103/64     Pulse Rate 09/05/18 1834 74     Resp 09/05/18 1834 16     Temp 09/05/18 1834 (!) 97.5 F (36.4 C)     Temp Source 09/05/18 1834 Oral     SpO2 09/05/18 1834 99 %     Weight 09/05/18 1835 212 lb (96.2 kg)     Height 09/05/18 1835 6\' 4"  (1.93 m)     Head Circumference --      Peak Flow --      Pain Score 09/05/18 1835 5     Pain Loc --      Pain Edu? --      Excl. in [place]? --     Constitutional: Alert and oriented. Well appearing and in no acute distress. Eyes: Conjunctivae are normal Head: Atraumatic HEENT: No congestion/rhinnorhea. Mucous membranes are moist.  Oropharynx non-erythematous Neck:   Nontender with no meningismus, no masses, no stridor Cardiovascular: Normal rate, regular rhythm. Grossly normal heart sounds.  Good peripheral circulation. Respiratory: Normal respiratory effort.  No retractions. Lungs CTAB. Abdominal: Soft and left lower quadrant discomfort. No distention. No guarding no rebound Back:  There is no focal  tenderness or step off.  there is no midline tenderness there are no lesions noted. there is no CVA tenderness Normal external noncircumcised male genitalia no testicular pain or swelling or mass.  I cannot reproduce an inguinal discomfort, no masses noted Musculoskeletal: No lower extremity tenderness, no upper extremity tenderness. No joint effusions, no DVT signs strong distal pulses no edema Neurologic:  Normal speech and language. No gross focal neurologic deficits are appreciated.  Skin:  Skin is warm, dry and intact. No rash noted. Psychiatric: Mood and affect are anxious. Speech and behavior are normal.  ____________________________________________   LABS (all labs ordered are listed, but only abnormal results are displayed)  Labs Reviewed  BASIC METABOLIC PANEL - Abnormal; Notable for the following components:      Result Value   Glucose, Bld 131 (*)    BUN 25 (*)    Creatinine, Ser 1.44 (*)    GFR calc non Af Amer 44 (*)    GFR calc Af Amer 51 (*)    All other components within normal limits  CBC - Abnormal; Notable for the following components:   RBC 3.29 (*)    Hemoglobin 11.3 (*)    HCT 33.5 (*)    MCV 101.8 (*)    MCH 34.3 (*)    All other components within normal limits  URINALYSIS, COMPLETE (UACMP) WITH MICROSCOPIC - Abnormal; Notable for the following components:   Color, Urine AMBER (*)    APPearance CLEAR (*)    All other components within normal limits  CBG MONITORING, ED    Pertinent labs  results that were available during my care of the patient were reviewed by me and considered in my medical decision making (see chart for details). ____________________________________________  EKG  I personally interpreted any EKGs ordered by me or triage Normal sinus rhythm rate 76 bpm no acute ST elevation or depression LAD noted no acute ischemia ____________________________________________  RADIOLOGY  Pertinent labs & imaging results that were available during  my care of the patient were reviewed by me and considered in my medical decision making (  see chart for details). If possible, patient and/or family made aware of any abnormal findings.  No results found. ____________________________________________    PROCEDURES  Procedure(s) performed: None  Procedures  Critical Care performed: None  ____________________________________________   INITIAL IMPRESSION / ASSESSMENT AND PLAN / ED COURSE  Pertinent labs & imaging results that were available during my care of the patient were reviewed by me and considered in my medical decision making (see chart for details).  With chronic left inguinal pain and chronic "dizziness" presents with both.  He has been dizzy every day for the last at least month and likely longer to the best of his recollection and he is had this recurrent pain in his groin for "years".  There is no acute pathology seen in consideration of either of these pathologies.  He is not really here for the "dizziness" he states that is chronic.  He feels that his groin pain got acutely worse over the last couple days but although at this time it is feeling better.  On exam there is no evidence of testicular mass or swelling or pain, there is some very slight left lower quadrant discomfort, we will obtain CT scan to further evaluate that given his age, low suspicion for AAA low suspicion for dissection low suspicion for appendicitis etc.  He has a very benign exam.  He is very anxious about his exam which in my experience for him is not unusual.  However, fortunately, we are looking at a very reassuring presentation.  Urinalysis is normal CBC is reassuring BMP is reassuring with creatinine near its baseline.  ----------------------------------------- 9:18 PM on 09/05/2018 -----------------------------------------  Patient has no pain at this time his belly exam remains benign he is got excellent distal pulses, no masses testicular exam is  normal he has been having pain there for "years" his family states "it feels real to him" however at this time I see no indication for surgery or admission and therefore we will discharge the patient.  They are eager to go home at this time.  Patient also complains of feeling chronically lightheaded and I do note that this is been going on for a long time I do not see any evidence of anemia or infection hypotension renal failure etc. and we have encouraged him to take good p.o.  There is no evidence that this is an acute complaint in fact he states that it is better today than most days, he has excellent outpatient physicians and I will defer to them further work-up.  We did also notice a lung nodule, patient has been instructed to follow-up appropriately with his PCP and they are already aware of it.  Extensive return precautions and follow-up given and understood.     ____________________________________________   FINAL CLINICAL IMPRESSION(S) / ED DIAGNOSES  Final diagnoses:  None      This chart was dictated using voice recognition software.  Despite best efforts to proofread,  errors can occur which can change meaning.      Schuyler Amor, MD 09/05/18 2004    Schuyler Amor, MD 09/05/18 2119

## 2018-09-05 NOTE — Discharge Instructions (Signed)
It is unclear exactly why you continually have this pain in your left groin region.  At this time your CT scan and your blood work are reassuring.  If it gets worse or you have any new or worrisome symptoms please return to the emergency department.  As for your chronic feeling that you are lightheaded, we do advise that you continue doing what you are doing which is work with your doctor with your blood pressure medication etc.  Have any chest pain shortness of breath fever vomiting severe abdominal pain, weakness numbness or other concerns including bleeding or diarrhea please return to the emergency room.

## 2018-09-08 ENCOUNTER — Encounter: Payer: Self-pay | Admitting: Gastroenterology

## 2018-09-08 ENCOUNTER — Ambulatory Visit: Payer: Medicare HMO | Admitting: Gastroenterology

## 2018-09-08 VITALS — BP 128/83 | HR 66 | Ht 76.0 in | Wt 211.0 lb

## 2018-09-08 DIAGNOSIS — R079 Chest pain, unspecified: Secondary | ICD-10-CM

## 2018-09-08 NOTE — Progress Notes (Signed)
[name] MD, MRCP(U.K) [address]  [place]  [place], [place] [address]  Main: [phone]  Fax: [phone]   Primary Care Physician: [name], MD  Primary Gastroenterologist:  Dr. [name]   Chief Complaint  Patient presents with  . Nausea    Globus sensation, loss of appetite    HPI: William Taylor is a 85 y.o. male    Summary of history : He has a history of CAD,stent placment in 2005 , Atrial fibrillation 2011, On anticoagulation . Atavisit to see his cardiologist Dr Rockey Situ , reported symptoms of shortness of breath, fatigue ,exertion . He was noted to have a drop in his Hb , stool was positive for blood . He wason xarelto. He has external hemorroids. 06/2016 Ferritin 9 , TIBC elevated. 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 10/03/16- capsule study showedno bleeding but a few small polyps were seen . 09/2017 : iron studies were normal . 11/2017 b12 and folate normal.    Interval history 12/15/17 -09/08/2018  Seen Dr Janese Banks Hematology - diagnosed with cold agglutinin hemolytic anemia.   He says that when he stands up and tries to shave gets dizzy. . Ongoing since new years day. Calls it a discomfort, comes and goes, presently has none.Left sid eof his chest , has radiated to his chin and jaw  Not related to food. Each episode lasts for an hour or 45 minutes, not sharp .   He has a bowel movement every other day or two days- says has been constipated. Says pain is not better after a good bowel movement    Was in the ER for inguinal pains and CT abdomen showed only chololithiasis.   Hb 1/20220- 11.3 grams    On xarelto - no blood in his stool. . On oral iron one tablet a day . No blood in the stool.  On iron , doing well. No complaints except  for fatigue for 5 months.   Lost 9 lbs over the past 9 months. Says he is hungry eats a bit and then cant eat anymore.   Takes Nexium once a day . Says he has been prescribed thyroxine but has not yet started taking it. He also says that he has pain in his left arm and was told it was angina. Given NTG for it,   He is scheduled for an ECHO. Says been in touch with Dr Gwenyth Ober office.   BP 128/83   Pulse 66   Ht 6\' 4"  (1.93 m)   Wt 211 lb (95.7 kg)   BMI 25.68 kg/m      Current Outpatient Medications  Medication Sig Dispense Refill  . amiodarone (PACERONE) 200 MG tablet TAKE 1 TABLET BY MOUTH  DAILY AS NEEDED 90 tablet 2  . cyproheptadine (PERIACTIN) 4 MG tablet Take 1 tablet (4 mg total) by mouth 3 (three) times daily as needed for allergies. 90 tablet 3  . diltiazem (CARDIZEM CD) 120 MG 24 hr capsule TAKE 1 CAPSULE BY MOUTH  DAILY 90 capsule 2  . esomeprazole (NEXIUM) 40 MG capsule TAKE 1 CAPSULE BY MOUTH  DAILY AT 12 NOON 90 capsule 3  . fenofibrate micronized (ANTARA) 130 MG capsule TAKE 1 CAPSULE BY MOUTH  DAILY BEFORE BREAKFAST 90 capsule 1  . finasteride (PROSCAR) 5 MG tablet Take 1  tablet (5 mg total) by mouth daily. 90 tablet 2  . levobunolol (BETAGAN) 0.5 % ophthalmic solution Place 1 drop into the right eye 2 (two) times daily.     . Levothyroxine Sodium 25 MCG CAPS Take 1 capsule (25 mcg total) by mouth daily before breakfast. 30 capsule 2  . metoprolol tartrate (LOPRESSOR) 25 MG tablet Take 0.5 tablets (12.5 mg total) by mouth 2 (two) times daily. 90 tablet 1  . nitroGLYCERIN (NITROSTAT) 0.4 MG SL tablet DISSOLVE UNDER THE TONGUE 1 TABLET EVERY 5 MINUTES AS NEEDED FOR CHEST PAIN 25 tablet 3  . Omega-3 Fatty Acids (FISH OIL) 1000 MG CAPS Take 1,000 mg by mouth daily.    . ondansetron (ZOFRAN ODT) 4 MG disintegrating tablet Take 1 tablet (4 mg total) by mouth every 8 (eight) hours as needed for nausea. 20 tablet 2  . polyethylene glycol powder (GLYCOLAX/MIRALAX) powder Take  17 g by mouth 2 (two) times daily as needed. 3350 g 1  . pravastatin (PRAVACHOL) 40 MG tablet TAKE 1 TABLET BY MOUTH AT  BEDTIME 90 tablet 3  . ranolazine (RANEXA) 500 MG 12 hr tablet Take 1 tablet (500 mg total) by mouth 2 (two) times daily. 60 tablet 3  . tamsulosin (FLOMAX) 0.4 MG CAPS capsule Take 1 capsule (0.4 mg total) by mouth daily. 30 capsule 11  . XARELTO 20 MG TABS tablet TAKE 1 TABLET DAILY 90 tablet 1   No current facility-administered medications for this visit.     Allergies as of 09/08/2018 - Review Complete 09/08/2018  Allergen Reaction Noted  . Bee venom Swelling 07/17/2015  . Sulfasalazine Hives and Swelling 01/22/2016  . Sulfa antibiotics Hives and Swelling 01/22/2016  . Warfarin and related Other (See Comments) 05/18/2012    ROS:  General: Negative for anorexia, weight loss, fever, chills, fatigue, weakness. ENT: Negative for hoarseness, difficulty swallowing , nasal congestion. CV: Negative for chest pain, angina, palpitations, dyspnea on exertion, peripheral edema.  Respiratory: Negative for dyspnea at rest, dyspnea on exertion, cough, sputum, wheezing.  GI: See history of present illness. GU:  Negative for dysuria, hematuria, urinary incontinence, urinary frequency, nocturnal urination.  Endo: Negative for unusual weight change.    Physical Examination:   BP 128/83   Pulse 66   Ht 6\' 4"  (1.93 m)   Wt 211 lb (95.7 kg)   BMI 25.68 kg/m   General: Well-nourished, well-developed in no acute distress.  Eyes: No icterus. Conjunctivae pink. Mouth: Oropharyngeal mucosa moist and pink , no lesions erythema or exudate. Lungs: Clear to auscultation bilaterally. Non-labored. Heart: Regular rate and rhythm, no murmurs rubs or gallops.  Abdomen: Bowel sounds are normal, nontender, nondistended, no hepatosplenomegaly or masses, no abdominal bruits or hernia , no rebound or guarding.   Extremities: No lower extremity edema. No clubbing or deformities. Neuro: Alert  and oriented x 3.  Grossly intact. Skin: Warm and dry, no jaundice.   Psych: Alert and cooperative, normal mood and affect.   Imaging Studies: Ct Renal Stone Study  Result Date: 09/05/2018 CLINICAL DATA:  Left-sided abdominal pain EXAM: CT ABDOMEN AND PELVIS WITHOUT CONTRAST TECHNIQUE: Multidetector CT imaging of the abdomen and pelvis was performed following the standard protocol without IV contrast. COMPARISON:  07/07/2018 FINDINGS: Lower chest: Lung bases are free of acute infiltrate or sizable effusion. Stable left lower lobe nodule is noted. Mild pericardial calcifications are noted. Hepatobiliary: Liver is within normal limits. The gallbladder is well distended with dependent density consistent with cholelithiasis. Pancreas: Unremarkable. No  pancreatic ductal dilatation or surrounding inflammatory changes. Spleen: Scattered calcified granulomas are noted. Adrenals/Urinary Tract: Adrenal glands are unremarkable. Kidneys are normal, without renal calculi, focal lesion, or hydronephrosis. Bladder is unremarkable. Stomach/Bowel: Scattered diverticular changes noted without evidence of diverticulitis. No obstructive changes are seen. The appendix is within normal limits. No small bowel abnormality is seen. The stomach is within normal limits. Vascular/Lymphatic: Aortic atherosclerosis. No enlarged abdominal or pelvic lymph nodes. Reproductive: Prostate is unremarkable. Other: No abdominal wall hernia or abnormality. No abdominopelvic ascites. Musculoskeletal: Degenerative changes of lumbar spine are noted. IMPRESSION: Diverticulosis without diverticulitis. Cholelithiasis without complicating factors. Stable left lower lobe nodule.  Recommendations as previously made. Electronically Signed   By: Inez Catalina M.D.   On: 09/05/2018 20:41    Assessment and Plan:   William Taylor is a 85 y.o. y/o malehere for follow up, here to see me for abdominal discomfort.  His description is left side of the chest  discomfort , radiating at times to his left jaw, he does feel dizzy when he stands up at times. Lost weight , diagnosed with hypothyroidism , not yet started medications. H/o iron deficiency anemia , cold agglutinin hemolytic anemia,   I will have my office to get in touch with Dr Gwenyth Ober office to see if them can address any of these issues- His symptoms sounds more like angina in terms of description of the pain. From the GI point of view will start him on Dexilant which does not need to be taken on any empty stomach. If no better then can consider an EGD but will need to be cleared  from a cardiac point of view prior to anesthesia.   Plan  1. Advised to pick up thyroxine and start it today  2. Dexilant samples     Dr [name]  MD,MRCP [facility]) Follow up in 2 weeks.

## 2018-09-10 ENCOUNTER — Ambulatory Visit (INDEPENDENT_AMBULATORY_CARE_PROVIDER_SITE_OTHER): Payer: Medicare HMO

## 2018-09-10 DIAGNOSIS — I209 Angina pectoris, unspecified: Secondary | ICD-10-CM

## 2018-09-10 DIAGNOSIS — R0600 Dyspnea, unspecified: Secondary | ICD-10-CM

## 2018-09-10 MED ORDER — PERFLUTREN LIPID MICROSPHERE
1.0000 mL | INTRAVENOUS | Status: AC | PRN
  Administered 2018-09-10: 2 mL via INTRAVENOUS

## 2018-09-14 ENCOUNTER — Telehealth: Payer: Self-pay

## 2018-09-14 NOTE — Telephone Encounter (Signed)
Spoke with patient on phone regarding ECHO results.Pt verbalized understanding and reported that he was still having same sx that he had at last OV.   Pt agreed to f/u as previously planned. Encouraged pt to call with any further questions or concerns or worsening sx.

## 2018-09-14 NOTE — Telephone Encounter (Signed)
-----   Message from Theora Gianotti, NP sent at 09/12/2018  6:40 AM EST ----- Nl heart squeezing fxn w/o significant valvular dzs. Heart is mildly stiff, so need to make sure we are doing our best at managing BP/HR (both fine/bp low end @ office visit).

## 2018-09-15 ENCOUNTER — Telehealth: Payer: Self-pay

## 2018-09-15 DIAGNOSIS — R42 Dizziness and giddiness: Secondary | ICD-10-CM

## 2018-09-15 NOTE — Telephone Encounter (Signed)
Mychart message that was closed out by accident.   Pt sent a message about the following;  Dizziness still same since Jan 1, 20. Cannot stay up long without feeling nauseated. Please refer me to [email or web address]. Have been in bed all but 2 days this year. [phone]  Sent to PCP to advise.   High priority due to responding late to patient's concerns.

## 2018-09-15 NOTE — Telephone Encounter (Signed)
I have placed this referral though it sounds as though he needs to be seen again in the office for evaluation to determine if any additional evaluation is needed. Please offer him an appointment with me or Lauren or Joycelyn Schmid.

## 2018-09-15 NOTE — Telephone Encounter (Signed)
Called and spoke with patient. Pt advised and has been scheduled for an appt with Philis Nettle

## 2018-09-16 ENCOUNTER — Telehealth: Payer: Self-pay | Admitting: Family Medicine

## 2018-09-16 ENCOUNTER — Encounter: Payer: Self-pay | Admitting: Family Medicine

## 2018-09-16 ENCOUNTER — Ambulatory Visit: Payer: Medicare HMO | Admitting: Family Medicine

## 2018-09-16 ENCOUNTER — Other Ambulatory Visit: Payer: Self-pay

## 2018-09-16 ENCOUNTER — Inpatient Hospital Stay
Admission: EM | Admit: 2018-09-16 | Discharge: 2018-09-23 | DRG: 312 | Disposition: A | Discharge status: Home / Self Care | Payer: Medicare HMO | Attending: Family Medicine | Admitting: Family Medicine

## 2018-09-16 VITALS — BP 122/82 | HR 77 | Temp 97.8°F | Resp 18 | Ht 76.0 in | Wt 208.2 lb

## 2018-09-16 DIAGNOSIS — I951 Orthostatic hypotension: Secondary | ICD-10-CM | POA: Diagnosis not present

## 2018-09-16 DIAGNOSIS — E785 Hyperlipidemia, unspecified: Secondary | ICD-10-CM | POA: Diagnosis present

## 2018-09-16 DIAGNOSIS — E860 Dehydration: Secondary | ICD-10-CM

## 2018-09-16 DIAGNOSIS — Z8719 Personal history of other diseases of the digestive system: Secondary | ICD-10-CM

## 2018-09-16 DIAGNOSIS — R634 Abnormal weight loss: Secondary | ICD-10-CM | POA: Diagnosis not present

## 2018-09-16 DIAGNOSIS — Z823 Family history of stroke: Secondary | ICD-10-CM

## 2018-09-16 DIAGNOSIS — E440 Moderate protein-calorie malnutrition: Secondary | ICD-10-CM | POA: Diagnosis present

## 2018-09-16 DIAGNOSIS — Z882 Allergy status to sulfonamides status: Secondary | ICD-10-CM

## 2018-09-16 DIAGNOSIS — Z91030 Bee allergy status: Secondary | ICD-10-CM

## 2018-09-16 DIAGNOSIS — R110 Nausea: Secondary | ICD-10-CM

## 2018-09-16 DIAGNOSIS — I10 Essential (primary) hypertension: Secondary | ICD-10-CM

## 2018-09-16 DIAGNOSIS — N183 Chronic kidney disease, stage 3 (moderate): Secondary | ICD-10-CM | POA: Diagnosis present

## 2018-09-16 DIAGNOSIS — I2510 Atherosclerotic heart disease of native coronary artery without angina pectoris: Secondary | ICD-10-CM | POA: Diagnosis not present

## 2018-09-16 DIAGNOSIS — Z8249 Family history of ischemic heart disease and other diseases of the circulatory system: Secondary | ICD-10-CM

## 2018-09-16 DIAGNOSIS — Z85828 Personal history of other malignant neoplasm of skin: Secondary | ICD-10-CM

## 2018-09-16 DIAGNOSIS — I129 Hypertensive chronic kidney disease with stage 1 through stage 4 chronic kidney disease, or unspecified chronic kidney disease: Secondary | ICD-10-CM | POA: Diagnosis present

## 2018-09-16 DIAGNOSIS — Z7901 Long term (current) use of anticoagulants: Secondary | ICD-10-CM

## 2018-09-16 DIAGNOSIS — D649 Anemia, unspecified: Secondary | ICD-10-CM | POA: Diagnosis not present

## 2018-09-16 DIAGNOSIS — E782 Mixed hyperlipidemia: Secondary | ICD-10-CM | POA: Diagnosis not present

## 2018-09-16 DIAGNOSIS — N400 Enlarged prostate without lower urinary tract symptoms: Secondary | ICD-10-CM | POA: Diagnosis present

## 2018-09-16 DIAGNOSIS — I480 Paroxysmal atrial fibrillation: Secondary | ICD-10-CM | POA: Diagnosis not present

## 2018-09-16 DIAGNOSIS — H814 Vertigo of central origin: Secondary | ICD-10-CM | POA: Diagnosis not present

## 2018-09-16 DIAGNOSIS — R630 Anorexia: Secondary | ICD-10-CM

## 2018-09-16 DIAGNOSIS — Z66 Do not resuscitate: Secondary | ICD-10-CM | POA: Diagnosis present

## 2018-09-16 DIAGNOSIS — K449 Diaphragmatic hernia without obstruction or gangrene: Secondary | ICD-10-CM | POA: Diagnosis present

## 2018-09-16 DIAGNOSIS — D591 Other autoimmune hemolytic anemias: Secondary | ICD-10-CM | POA: Diagnosis present

## 2018-09-16 DIAGNOSIS — K5710 Diverticulosis of small intestine without perforation or abscess without bleeding: Secondary | ICD-10-CM | POA: Diagnosis present

## 2018-09-16 DIAGNOSIS — Z888 Allergy status to other drugs, medicaments and biological substances status: Secondary | ICD-10-CM

## 2018-09-16 DIAGNOSIS — D631 Anemia in chronic kidney disease: Secondary | ICD-10-CM | POA: Diagnosis present

## 2018-09-16 DIAGNOSIS — R55 Syncope and collapse: Secondary | ICD-10-CM | POA: Diagnosis not present

## 2018-09-16 DIAGNOSIS — I25118 Atherosclerotic heart disease of native coronary artery with other forms of angina pectoris: Secondary | ICD-10-CM

## 2018-09-16 DIAGNOSIS — Z9841 Cataract extraction status, right eye: Secondary | ICD-10-CM

## 2018-09-16 DIAGNOSIS — K921 Melena: Secondary | ICD-10-CM | POA: Diagnosis present

## 2018-09-16 DIAGNOSIS — I252 Old myocardial infarction: Secondary | ICD-10-CM

## 2018-09-16 DIAGNOSIS — Z6824 Body mass index (BMI) 24.0-24.9, adult: Secondary | ICD-10-CM

## 2018-09-16 DIAGNOSIS — Z800 Family history of malignant neoplasm of digestive organs: Secondary | ICD-10-CM

## 2018-09-16 DIAGNOSIS — E039 Hypothyroidism, unspecified: Secondary | ICD-10-CM | POA: Diagnosis present

## 2018-09-16 DIAGNOSIS — I440 Atrioventricular block, first degree: Secondary | ICD-10-CM | POA: Diagnosis present

## 2018-09-16 DIAGNOSIS — Z8546 Personal history of malignant neoplasm of prostate: Secondary | ICD-10-CM

## 2018-09-16 LAB — BASIC METABOLIC PANEL
Anion gap: 10 (ref 5–15)
BUN: 25 mg/dL — AB (ref 8–23)
CHLORIDE: 103 mmol/L (ref 98–111)
CO2: 23 mmol/L (ref 22–32)
CREATININE: 1.43 mg/dL — AB (ref 0.61–1.24)
Calcium: 9.5 mg/dL (ref 8.9–10.3)
GFR calc Af Amer: 51 mL/min — ABNORMAL LOW (ref >60)
GFR calc non Af Amer: 44 mL/min — ABNORMAL LOW (ref >60)
Glucose, Bld: 125 mg/dL — ABNORMAL HIGH (ref 70–99)
POTASSIUM: 3.8 mmol/L (ref 3.5–5.1)
Sodium: 136 mmol/L (ref 135–145)

## 2018-09-16 LAB — TSH: TSH: 4.984 u[IU]/mL — ABNORMAL HIGH (ref 0.350–4.500)

## 2018-09-16 LAB — URINALYSIS, COMPLETE (UACMP) WITH MICROSCOPIC
Bacteria, UA: NONE SEEN
Bilirubin Urine: NEGATIVE
Glucose, UA: NEGATIVE mg/dL
Hgb urine dipstick: NEGATIVE
Ketones, ur: NEGATIVE mg/dL
Leukocytes, UA: NEGATIVE
NITRITE: NEGATIVE
Protein, ur: NEGATIVE mg/dL
Specific Gravity, Urine: 1.014 (ref 1.005–1.030)
Squamous Epithelial / HPF: NONE SEEN (ref 0–5)
pH: 5 (ref 5.0–8.0)

## 2018-09-16 LAB — PROTIME-INR
INR: 4.04 — AB
Prothrombin Time: 38.6 seconds — ABNORMAL HIGH (ref 11.4–15.2)

## 2018-09-16 LAB — HEMOGLOBIN A1C
HEMOGLOBIN A1C: 5.5 % (ref 4.8–5.6)
Mean Plasma Glucose: 111.15 mg/dL

## 2018-09-16 LAB — CBC
HCT: 30.7 % — ABNORMAL LOW (ref 39.0–52.0)
Hemoglobin: 10.8 g/dL — ABNORMAL LOW (ref 13.0–17.0)
MCH: 36.1 pg — ABNORMAL HIGH (ref 26.0–34.0)
MCHC: 35.2 g/dL (ref 30.0–36.0)
MCV: 102.7 fL — ABNORMAL HIGH (ref 80.0–100.0)
Platelets: 288 10*3/uL (ref 150–400)
RBC: 2.99 MIL/uL — ABNORMAL LOW (ref 4.22–5.81)
RDW: 13.2 % (ref 11.5–15.5)
WBC: 7.6 10*3/uL (ref 4.0–10.5)
nRBC: 0 % (ref 0.0–0.2)

## 2018-09-16 LAB — TROPONIN I: Troponin I: <0.03 ng/mL (ref <0.03)

## 2018-09-16 MED ORDER — LEVOTHYROXINE SODIUM 25 MCG PO TABS
25.0000 ug | ORAL_TABLET | Freq: Every day | ORAL | Status: DC
  Administered 2018-09-17 (×2): 25 ug via ORAL
  Filled 2018-09-16: qty 1

## 2018-09-16 MED ORDER — POLYETHYLENE GLYCOL 3350 17 G PO PACK: 17.0000 g | PACK | Freq: Two times a day (BID) | ORAL | Status: DC | PRN

## 2018-09-16 MED ORDER — NITROGLYCERIN 0.4 MG SL SUBL: 0.4000 mg | SUBLINGUAL_TABLET | SUBLINGUAL | Status: DC | PRN

## 2018-09-16 MED ORDER — ONDANSETRON 8 MG PO TBDP: 4.0000 mg | ORAL_TABLET | Freq: Three times a day (TID) | ORAL | 1 refills | Status: DC | PRN

## 2018-09-16 MED ORDER — RIVAROXABAN 20 MG PO TABS: 20.0000 mg | ORAL_TABLET | Freq: Every day | ORAL | Status: DC

## 2018-09-16 MED ORDER — SODIUM CHLORIDE 0.9 % IV BOLUS
500.0000 mL | Freq: Once | INTRAVENOUS | Status: AC
  Administered 2018-09-16: 500 mL via INTRAVENOUS

## 2018-09-16 MED ORDER — RANOLAZINE ER 500 MG PO TB12
500.0000 mg | ORAL_TABLET | Freq: Two times a day (BID) | ORAL | Status: DC
  Administered 2018-09-16 – 2018-09-17 (×2): 500 mg via ORAL
  Filled 2018-09-16 (×3): qty 1

## 2018-09-16 MED ORDER — TAMSULOSIN HCL 0.4 MG PO CAPS: 0.4000 mg | ORAL_CAPSULE | Freq: Every day | ORAL | Status: DC

## 2018-09-16 MED ORDER — FENOFIBRATE 160 MG PO TABS
160.0000 mg | ORAL_TABLET | Freq: Every day | ORAL | Status: DC
  Administered 2018-09-16 – 2018-09-23 (×8): 160 mg via ORAL
  Filled 2018-09-16 (×8): qty 1

## 2018-09-16 MED ORDER — DOCUSATE SODIUM 100 MG PO CAPS
100.0000 mg | ORAL_CAPSULE | Freq: Two times a day (BID) | ORAL | Status: DC | PRN
  Administered 2018-09-23: 100 mg via ORAL
  Filled 2018-09-16: qty 1

## 2018-09-16 MED ORDER — SODIUM CHLORIDE 0.9 % IV SOLN
INTRAVENOUS | Status: DC
  Administered 2018-09-16 – 2018-09-23 (×8): via INTRAVENOUS

## 2018-09-16 MED ORDER — POLYETHYLENE GLYCOL 3350 17 GM/SCOOP PO POWD: 17.0000 g | Freq: Two times a day (BID) | ORAL | Status: DC | PRN

## 2018-09-16 MED ORDER — AMIODARONE HCL 200 MG PO TABS
200.0000 mg | ORAL_TABLET | Freq: Every day | ORAL | Status: DC
  Administered 2018-09-16 – 2018-09-23 (×8): 200 mg via ORAL
  Filled 2018-09-16 (×8): qty 1

## 2018-09-16 MED ORDER — FINASTERIDE 5 MG PO TABS
5.0000 mg | ORAL_TABLET | Freq: Every day | ORAL | Status: DC
  Administered 2018-09-17 – 2018-09-23 (×7): 5 mg via ORAL
  Filled 2018-09-16 (×8): qty 1

## 2018-09-16 MED ORDER — ONDANSETRON 4 MG PO TBDP
4.0000 mg | ORAL_TABLET | Freq: Three times a day (TID) | ORAL | Status: DC | PRN
  Administered 2018-09-19: 4 mg via ORAL
  Filled 2018-09-16 (×3): qty 1

## 2018-09-16 MED ORDER — OMEGA-3-ACID ETHYL ESTERS 1 G PO CAPS
1.0000 g | ORAL_CAPSULE | Freq: Every day | ORAL | Status: DC
  Administered 2018-09-16 – 2018-09-23 (×8): 1 g via ORAL
  Filled 2018-09-16 (×8): qty 1

## 2018-09-16 MED ORDER — CYPROHEPTADINE HCL 4 MG PO TABS
4.0000 mg | ORAL_TABLET | Freq: Three times a day (TID) | ORAL | Status: DC | PRN
  Administered 2018-09-17: 4 mg via ORAL
  Filled 2018-09-16 (×2): qty 1

## 2018-09-16 MED ORDER — RIVAROXABAN 15 MG PO TABS
15.0000 mg | ORAL_TABLET | Freq: Every day | ORAL | Status: DC
  Administered 2018-09-17: 15 mg via ORAL
  Filled 2018-09-16: qty 1

## 2018-09-16 MED ORDER — SODIUM CHLORIDE 0.9 % IV SOLN: Freq: Once | INTRAVENOUS | Status: DC

## 2018-09-16 MED ORDER — METOPROLOL TARTRATE 50 MG PO TABS
50.0000 mg | ORAL_TABLET | Freq: Two times a day (BID) | ORAL | Status: DC
  Administered 2018-09-16: 50 mg via ORAL
  Filled 2018-09-16: qty 1

## 2018-09-16 MED ORDER — SODIUM CHLORIDE 0.9 % IV BOLUS
1000.0000 mL | Freq: Once | INTRAVENOUS | Status: AC
  Administered 2018-09-16: 1000 mL via INTRAVENOUS

## 2018-09-16 MED ORDER — LEVOBUNOLOL HCL 0.5 % OP SOLN
1.0000 [drp] | Freq: Two times a day (BID) | OPHTHALMIC | Status: DC
  Administered 2018-09-16 – 2018-09-23 (×14): 1 [drp] via OPHTHALMIC
  Filled 2018-09-16: qty 5

## 2018-09-16 MED ORDER — METOPROLOL TARTRATE 25 MG PO TABS: 12.5000 mg | ORAL_TABLET | Freq: Two times a day (BID) | ORAL | Status: DC

## 2018-09-16 MED ORDER — SODIUM CHLORIDE 0.9% FLUSH: 3.0000 mL | Freq: Once | INTRAVENOUS | Status: DC

## 2018-09-16 NOTE — H&P (Signed)
[place] [facility] [place] NAME: William Taylor    MR#:  [id number]  DATE OF BIRTH:  Jan 12, 1930  DATE OF ADMISSION:  09/16/2018  PRIMARY CARE PHYSICIAN: [name], MD   REQUESTING/REFERRING PHYSICIAN: McShane  CHIEF COMPLAINT:   Chief Complaint  Patient presents with  . Dizziness  . Hypotension    HISTORY OF PRESENT ILLNESS: William Taylor  is a 85 y.o. male with a known history of prostate cancer, colon polyps, coronary artery disease, hyperlipidemia, hypertension, paroxysmal atrial fibrillation, skin cancer-since Christmas he is feeling intermittent episodes of dizziness while standing up from lying down to sitting position and also feels nauseated with that.  This is bothering him to the point that he has decreased appetite due to his recurrent nausea and he lost 12 pound weight in last 1 to 2 months. He went to his primary care doctor and also came to emergency room for this and he has multiple work-ups done but no solution was found so far.  His CT scan on chest and abdomen and echocardiogram were done also he was checked for infections by use urine analysis that was negative. Today again in emergency room his blood pressure noted to have significant drop on standing up compared to lying down so ER physician suggested to admit to hospitalist service for further management.  PAST MEDICAL HISTORY:   Past Medical History:  Diagnosis Date  . Cancer [facility])    Prostate, followed by Dr. Jacqlyn Larsen  . Colon polyp   . Coronary artery disease   . Hyperlipidemia   . Hypertension   . MI (myocardial infarction) ([place])    2015  . PAF (paroxysmal atrial fibrillation) ([place])    a. on xarelto  . Skin cancer     PAST SURGICAL HISTORY:  Past Surgical History:  Procedure Laterality Date  . CARDIAC CATHETERIZATION  10/2013   [facility]  . CATARACT EXTRACTION  Oct. 3, 2012   right eye  . colonoscopy    . COLONOSCOPY W/ POLYPECTOMY  2015   Dr Rayann Heman  .  COLONOSCOPY WITH PROPOFOL N/A 08/15/2016   Procedure: COLONOSCOPY WITH PROPOFOL;  Surgeon: [name], MD;  Location: [facility] ENDOSCOPY;  Service: Endoscopy;  Laterality: N/A;  . CORONARY ANGIOPLASTY  2009   2005; s/p stent  . ESOPHAGOGASTRODUODENOSCOPY (EGD) WITH PROPOFOL N/A 08/15/2016   Procedure: ESOPHAGOGASTRODUODENOSCOPY (EGD) WITH PROPOFOL;  Surgeon: [name], MD;  Location: [facility] ENDOSCOPY;  Service: Endoscopy;  Laterality: N/A;  . GIVENS CAPSULE STUDY N/A 09/26/2016   Procedure: GIVENS CAPSULE STUDY;  Surgeon: [name], MD;  Location: [facility] ENDOSCOPY;  Service: Endoscopy;  Laterality: N/A;  . HERNIA REPAIR  2013  . LUMBAR SPINE SURGERY    . UPPER GI ENDOSCOPY  Sept 2015   Dr Rayann Heman    SOCIAL HISTORY:  Social History   Tobacco Use  . Smoking status: Never Smoker  . Smokeless tobacco: Never Used  Substance Use Topics  . Alcohol use: No    FAMILY HISTORY:  Family History  Problem Relation Age of Onset  . Stroke Mother   . Colon cancer Father   . Coronary artery disease Brother   . Other Brother 64       lymes dz    DRUG ALLERGIES:  Allergies  Allergen Reactions  . Bee Venom Swelling  . Sulfasalazine Hives and Swelling  . Sulfa Antibiotics Hives and Swelling  . Warfarin And Related Other (See Comments)    Chest pain  REVIEW OF SYSTEMS:   CONSTITUTIONAL: No fever, have fatigue or weakness.  EYES: No blurred or double vision.  EARS, NOSE, AND THROAT: No tinnitus or ear pain.  RESPIRATORY: No cough, shortness of breath, wheezing or hemoptysis.  CARDIOVASCULAR: No chest pain, orthopnea, edema.  GASTROINTESTINAL: No nausea, vomiting, diarrhea or abdominal pain.  GENITOURINARY: No dysuria, hematuria.  ENDOCRINE: No polyuria, nocturia,  HEMATOLOGY: No anemia, easy bruising or bleeding SKIN: No rash or lesion. MUSCULOSKELETAL: No joint pain or arthritis.   NEUROLOGIC: No tingling, numbness, weakness.  PSYCHIATRY: No anxiety or depression.   MEDICATIONS AT HOME:   Prior to Admission medications   Medication Sig Start Date End Date Taking? Authorizing Provider  amiodarone (PACERONE) 200 MG tablet TAKE 1 TABLET BY MOUTH  DAILY AS NEEDED 04/14/18  Yes Gollan, Kathlene November, MD  diltiazem (CARDIZEM CD) 120 MG 24 hr capsule TAKE 1 CAPSULE BY MOUTH  DAILY 04/15/18  Yes Gollan, Kathlene November, MD  fenofibrate micronized (ANTARA) 130 MG capsule TAKE 1 CAPSULE BY MOUTH  DAILY BEFORE BREAKFAST 08/24/18  Yes [name], MD  finasteride (PROSCAR) 5 MG tablet Take 1 tablet (5 mg total) by mouth daily. 02/05/18  Yes Stoioff, Ronda Fairly, MD  levobunolol (BETAGAN) 0.5 % ophthalmic solution Place 1 drop into the right eye 2 (two) times daily.    Yes [provider]  Levothyroxine Sodium 25 MCG CAPS Take 1 capsule (25 mcg total) by mouth daily before breakfast. 08/28/18  Yes Guse, Jacquelynn Cree, FNP  metoprolol tartrate (LOPRESSOR) 25 MG tablet Take 0.5 tablets (12.5 mg total) by mouth 2 (two) times daily. 08/17/18  Yes Dunn, Areta Haber, PA-C  nitroGLYCERIN (NITROSTAT) 0.4 MG SL tablet DISSOLVE UNDER THE TONGUE 1 TABLET EVERY 5 MINUTES AS NEEDED FOR CHEST PAIN 06/11/16  Yes Gollan, Kathlene November, MD  Omega-3 Fatty Acids (FISH OIL) 1000 MG CAPS Take 1,000 mg by mouth daily.   Yes [provider]  ondansetron (ZOFRAN-ODT) 8 MG disintegrating tablet Take 0.5 tablets (4 mg total) by mouth every 8 (eight) hours as needed for nausea. 09/16/18  Yes Guse, Jacquelynn Cree, FNP  polyethylene glycol powder (GLYCOLAX/MIRALAX) powder Take 17 g by mouth 2 (two) times daily as needed. 06/05/18  Yes [name], MD  pravastatin (PRAVACHOL) 40 MG tablet TAKE 1 TABLET BY MOUTH AT  BEDTIME 06/08/18  Yes [name], MD  ranolazine (RANEXA) 500 MG 12 hr tablet Take 1 tablet (500 mg total) by mouth 2 (two) times daily. 08/02/18  Yes Dunn, Areta Haber, PA-C  tamsulosin (FLOMAX) 0.4 MG CAPS capsule Take 1 capsule (0.4 mg total) by mouth daily. 06/15/18  Yes Stoioff, Ronda Fairly, MD  XARELTO 20 MG TABS  tablet TAKE 1 TABLET DAILY 06/12/18  Yes Gollan, Kathlene November, MD  cyproheptadine (PERIACTIN) 4 MG tablet Take 1 tablet (4 mg total) by mouth 3 (three) times daily as needed for allergies. 02/17/18   [name], MD      PHYSICAL EXAMINATION:   VITAL SIGNS: Blood pressure (!) 156/94, pulse 64, temperature 97.9 F (36.6 C), temperature source Oral, resp. rate 14, height 6\' 4"  (1.93 m), weight 94.3 kg, SpO2 98 %.  GENERAL:  85 y.o.-year-old patient lying in the bed with no acute distress.  EYES: Pupils equal, round, reactive to light and accommodation. No scleral icterus. Extraocular muscles intact.  HEENT: Head atraumatic, normocephalic. Oropharynx and nasopharynx clear.  NECK:  Supple, no jugular venous distention. No thyroid enlargement, no tenderness.  LUNGS: Normal breath  sounds bilaterally, no wheezing, rales,rhonchi or crepitation. No use of accessory muscles of respiration.  CARDIOVASCULAR: S1, S2 normal. No murmurs, rubs, or gallops.  ABDOMEN: Soft, nontender, nondistended. Bowel sounds present. No organomegaly or mass.  EXTREMITIES: No pedal edema, cyanosis, or clubbing.  NEUROLOGIC: Cranial nerves II through XII are intact. Muscle strength 5/5 in all extremities. Sensation intact. Gait not checked.  PSYCHIATRIC: The patient is alert and oriented x 3.  SKIN: No obvious rash, lesion, or ulcer.   LABORATORY PANEL:   CBC Recent Labs  Lab 09/16/18 1203  WBC 7.6  HGB 10.8*  HCT 30.7*  PLT 288  MCV 102.7*  MCH 36.1*  MCHC 35.2  RDW 13.2   ------------------------------------------------------------------------------------------------------------------  Chemistries  Recent Labs  Lab 09/16/18 1203  NA 136  K 3.8  CL 103  CO2 23  GLUCOSE 125*  BUN 25*  CREATININE 1.43*  CALCIUM 9.5   ------------------------------------------------------------------------------------------------------------------ estimated creatinine clearance is 45.5 mL/min (A) (by C-G formula  based on SCr of 1.43 mg/dL (H)). ------------------------------------------------------------------------------------------------------------------ No results for input(s): TSH, T4TOTAL, T3FREE, THYROIDAB in the last 72 hours.  Invalid input(s): FREET3   Coagulation profile Recent Labs  Lab 09/16/18 1355  INR 4.04*   ------------------------------------------------------------------------------------------------------------------- No results for input(s): DDIMER in the last 72 hours. -------------------------------------------------------------------------------------------------------------------  Cardiac Enzymes Recent Labs  Lab 09/16/18 1203  TROPONINI <0.03   ------------------------------------------------------------------------------------------------------------------ Invalid input(s): POCBNP  ---------------------------------------------------------------------------------------------------------------  Urinalysis    Component Value Date/Time   COLORURINE AMBER (A) 09/05/2018 1843   APPEARANCEUR CLEAR (A) 09/05/2018 1843   APPEARANCEUR Hazy 02/27/2014 1335   LABSPEC 1.017 09/05/2018 1843   LABSPEC 1.017 02/27/2014 1335   PHURINE 6.0 09/05/2018 1843   GLUCOSEU NEGATIVE 09/05/2018 1843   GLUCOSEU Negative 02/27/2014 1335   HGBUR NEGATIVE 09/05/2018 1843   BILIRUBINUR NEGATIVE 09/05/2018 1843   BILIRUBINUR neg 03/01/2014 1334   BILIRUBINUR Negative 02/27/2014 1335   KETONESUR NEGATIVE 09/05/2018 1843   PROTEINUR NEGATIVE 09/05/2018 1843   UROBILINOGEN 0.2 03/01/2014 1334   NITRITE NEGATIVE 09/05/2018 1843   LEUKOCYTESUR NEGATIVE 09/05/2018 1843   LEUKOCYTESUR Negative 02/27/2014 1335     RADIOLOGY: No results found.  EKG: Orders placed or performed during the hospital encounter of 09/16/18  . ED EKG  . ED EKG  . EKG 12-Lead  . EKG 12-Lead    IMPRESSION AND PLAN:  *Orthostatic hypotension Significant drop in blood pressure. Discussed with his  primary cardiologist Dr. Rockey Situ. He suggested to stop Cardizem continue amiodarone and metoprolol for heart rate control. I have also advised him to have thigh-high elastic stockings and repeat check of orthostatic vital signs daily. Check TSH. Today's urinalysis is still pending collection but he had 1 checked last week and that was without infection.  *Coronary artery disease Continue home medications but hold Cardizem for now.  *Paroxysmal atrial fibrillation Continue amiodarone and metoprolol, hold Cardizem, continue Xarelto. Patient's INR is high.  I have discussed about this concern with cardiologist.  *Hyperlipidemia Continue home medications.  *Chronic kidney disease stage III There is slight worsening in his creatinine clearance today, this could be secondary to not having enough oral intake for last few weeks due to recurrent nausea. We will give some IV fluid and monitor.  *Hypothyroidism TSH was high 2 weeks ago, will check again and if needed then adjusted thyroid medicines.  All the records are reviewed and case discussed with ED provider. Management plans discussed with the patient, family and they are in agreement.  CODE STATUS: DNR Code Status History  Date Active Date Inactive Code Status Order ID Comments User Context   03/07/2017 1810 03/09/2017 1638 Full Code [id number]  Lance Coon, MD Inpatient   07/06/2015 2225 07/07/2015 1914 Full Code [id number]  Hower, Aaron Mose, MD ED    Advance Directive Documentation     Most Recent Value  Type of Advance Directive  Healthcare Power of Attorney  Pre-existing out of facility DNR order (yellow form or pink MOST form)  -  "MOST" Form in Place?  -     [name] was present in the room during my visit.  TOTAL TIME TAKING CARE OF THIS PATIENT: 45 minutes.    Vaughan Basta M.D on 09/16/2018   Between 7am to 6pm - Pager - [phone]  After 6pm go to [email or web address] - password [email or web address]  Office  [phone]  CC: Primary care physician; [name], MD   Note: This dictation was prepared with Dragon dictation along with smaller phrase technology. Any transcriptional errors that result from this process are unintentional.

## 2018-09-16 NOTE — ED Notes (Signed)
Pt states that he became lightheaded when standing. Did not appear in distress. EDP in to see pt.

## 2018-09-16 NOTE — Consult Note (Signed)
Cardiology Consultation:   Patient ID: William Taylor MRN: [id number]; DOB: 03/26/1930  Admit date: 09/16/2018 Date of Consult: 09/16/2018  Primary Care Provider: [name], MD Primary Cardiologist:Dr. Rockey Situ Primary Electrophysiologist:  None    Patient Profile:   William Taylor is a 85 y.o. male with a hx of CAD s/p LAD stenting 2005 PCI-OM2 2012, PAF (Xarelto), PE 2006 in [place], PE following catheterization 2012, atypical chest pain, CKD III, cold agglutinin hemolytic anemia, prostate CA, orthostatic hypotension, HTN, HLD who is being seen today for the evaluation of pre-syncope sx at the request of Dr Burlene Arnt.  History of Present Illness:   William Taylor is an 85 yo male with complex PMH as above.  He previously underwent stenting to the LAD in 2005.  LHC in 2009 showed patent LAD stent without significant disease.  He was diagnosed with A. fib in 2011 in the setting of UTI.  He has had recurrent A. fib in 2012, 2013, and 2018.  Over the years he has been anticoagulated with Xarelto and on metoprolol as well as amiodarone with medications previously held secondary to bradycardia.  Repeat cardiac catheterization in 2012 showed 90% OM 2 stenosis and underwent PCI/DES at [facility].  Following catheterization, he had flank pain, and he was diagnosed with a PE with heavy clot burden.  Most recent cardiac cath from 10/2013 showed mid LAD-1/LAD-2 lesions, D1/D2 lesions, OM1 lesion, OM 2 stent patent without residual restenosis.  Medical management advised.  Most recent ischemic evaluation from 09/2017 via nuclear stress test showed no significant ischemia, EF 69%, low risk scan.  Most recent echo last Thursday 09/10/2018 showed mild concentric hypertrophy.  Estimated EF 55 to 60%, no wall motion abnormalities, G1DD, trivial AR. Further details regarding CV studies as copied and pasted below.   He is followed by hematology for cold agglutinin hemolytic anemia.  CT chest/abdomen/pelvis on 07/07/2017  showed mild prostatomegaly without findings specific for metastatic disease and 4 mm left lower lobe pulmonary nodule.  He was also reportedly seen GI with last endoscopy reported around 2 years prior.   Seen in the ED 07/2018 for chest pain and chronic exertional dyspnea, reported since early 2019 with nuclear stress test 09/2017 as above. He was started on Imdur 30mg  daily, discontinued for HA. He was then started on Ranexa 500mg  BID. At office follow up, patient reported no further symptoms of chest pain.  He did continue to note exertional dyspnea, dating back to early 2019.  He also noted positional dizziness, as well as a generalized "unwell" feeling that he described as closer to nausea.  It was noted he had recent soft documented BP at a urology visit with blood pressure 98/62.  He denied associated palpitations, lower extremity swelling, abdominal distention, orthopnea, PND, or early satiety.  No symptoms of presyncope or syncope.  He did indicate intermittent dizziness that would improve with sitting, although sometimes "it continues to last."  He was asymptomatic at the time of his visit.  His lisinopril was discontinued and Lopressor decreased to 12.5 mg twice daily.  It was stressed to maintain hydration.  Last weight 217lb 8 oz on 08/17/18.  Over the last month, and per the patient's wife, the patient has reportedly been lost 12 pounds and has been very weak and dizzy with lots of time in bed.  He rarely leaves bed, as he becomes very SOB and weak with ambulation and gets sx of presyncope when standing. She also reported the patient at least  3 to 10 cups of various beverages, from water to Gatorade, because her husband does "not drink as much as he should, and is chronically dehydrated."  Per patient, he is unable to eat and drink recently due to nausea.  When asked what happens when he drinks while nauseous, the patient denied attempting to do so for fear of emesis.  Per patient, he has had  progressive lower extremity weakness, SOB, and difficulty with presyncope when standing that takes 3-5 minutes to resolve once seated. He reported significant nausea and reduced intake of both food and fluids. He reported medication compliance.Of note, PTA medications include amiodarone 200 mg every evening, diltiazem 120 mg, finasteride, Synthroid, Lopressor 12.5 mg p.o. twice daily, nitro PRN, Zofran, GlycoLax/MiraLAX, pravastatin, Ranexa, Flomax, Xarelto, and cyproheptadine.   On 09/16/2018, the patient woke and took his metoprolol, Xarelto, flomax, and diltiazem. The patient then visited his PCP for referral to neurology.  He reportedly dropped his after visit summary to the floor, and started to bend down to pick it up, at which time he felt "like he was going to pass out."  He denied associated tachycardia, palpitations, or chest pain.  When asked if he could further describe his presyncope symptoms, he reported that he just felt his "knees might give out."  He stated this lower extremity weakness had been ongoing for the past couple of days.  He denied any "tunnel vision."  He did report dizziness; however, on further questioning, he did not feel that he was spinning, or the world spinning around him.  He denied shortness of breath with this episode.  In the ED, BP documented as 91/61 while sitting at presentation.  Heart rate 78 bpm, 99% on room air.  He was noted to have significant drop in BP upon standing, compared with lying down. SBP 160s-170s when lying down and became hypotensive with standing.  Labs significant for potassium 3.8, creatinine elevated from baseline and at 1.43, Hgb 10.8, hematocrit 30.7.TSH 4.984.  Cardiology contacted with recommendation to hold diltiazem and continue other cardiac home meds.  He was started on IV fluids and still noted is orthostatic.  He was admitted with suspicion of dehydration and orthostatic hypotension with cardiology consulted for workup/ medication  management.   Past Medical History:  Diagnosis Date  . Cancer [facility])    Prostate, followed by Dr. Jacqlyn Larsen  . Colon polyp   . Coronary artery disease   . Hyperlipidemia   . Hypertension   . MI (myocardial infarction) ([place])    2015  . PAF (paroxysmal atrial fibrillation) ([place])    a. on xarelto  . Skin cancer     Past Surgical History:  Procedure Laterality Date  . CARDIAC CATHETERIZATION  10/2013   [facility]  . CATARACT EXTRACTION  Oct. 3, 2012   right eye  . colonoscopy    . COLONOSCOPY W/ POLYPECTOMY  2015   Dr Rayann Heman  . COLONOSCOPY WITH PROPOFOL N/A 08/15/2016   Procedure: COLONOSCOPY WITH PROPOFOL;  Surgeon: [name], MD;  Location: [facility] ENDOSCOPY;  Service: Endoscopy;  Laterality: N/A;  . CORONARY ANGIOPLASTY  2009   2005; s/p stent  . ESOPHAGOGASTRODUODENOSCOPY (EGD) WITH PROPOFOL N/A 08/15/2016   Procedure: ESOPHAGOGASTRODUODENOSCOPY (EGD) WITH PROPOFOL;  Surgeon: [name], MD;  Location: [facility] ENDOSCOPY;  Service: Endoscopy;  Laterality: N/A;  . GIVENS CAPSULE STUDY N/A 09/26/2016   Procedure: GIVENS CAPSULE STUDY;  Surgeon: [name], MD;  Location: [facility] ENDOSCOPY;  Service: Endoscopy;  Laterality: N/A;  . HERNIA REPAIR  2013  . LUMBAR SPINE SURGERY    . UPPER GI ENDOSCOPY  Sept 2015   Dr Rudean Hitt Medications:  Prior to Admission medications   Medication Sig Start Date End Date Taking? Authorizing Provider  amiodarone (PACERONE) 200 MG tablet TAKE 1 TABLET BY MOUTH  DAILY AS NEEDED 04/14/18  Yes Gollan, Kathlene November, MD  diltiazem (CARDIZEM CD) 120 MG 24 hr capsule TAKE 1 CAPSULE BY MOUTH  DAILY 04/15/18  Yes Gollan, Kathlene November, MD  fenofibrate micronized (ANTARA) 130 MG capsule TAKE 1 CAPSULE BY MOUTH  DAILY BEFORE BREAKFAST 08/24/18  Yes [name], MD  finasteride (PROSCAR) 5 MG tablet Take 1 tablet (5 mg total) by mouth daily. 02/05/18  Yes Stoioff, Ronda Fairly, MD  levobunolol (BETAGAN) 0.5 % ophthalmic solution Place 1 drop into the right eye 2 (two) times daily.     Yes [provider]  Levothyroxine Sodium 25 MCG CAPS Take 1 capsule (25 mcg total) by mouth daily before breakfast. 08/28/18  Yes Guse, Jacquelynn Cree, FNP  metoprolol tartrate (LOPRESSOR) 25 MG tablet Take 0.5 tablets (12.5 mg total) by mouth 2 (two) times daily. 08/17/18  Yes Dunn, Areta Haber, PA-C  nitroGLYCERIN (NITROSTAT) 0.4 MG SL tablet DISSOLVE UNDER THE TONGUE 1 TABLET EVERY 5 MINUTES AS NEEDED FOR CHEST PAIN 06/11/16  Yes Gollan, Kathlene November, MD  Omega-3 Fatty Acids (FISH OIL) 1000 MG CAPS Take 1,000 mg by mouth daily.   Yes [provider]  ondansetron (ZOFRAN-ODT) 8 MG disintegrating tablet Take 0.5 tablets (4 mg total) by mouth every 8 (eight) hours as needed for nausea. 09/16/18  Yes Guse, Jacquelynn Cree, FNP  polyethylene glycol powder (GLYCOLAX/MIRALAX) powder Take 17 g by mouth 2 (two) times daily as needed. 06/05/18  Yes [name], MD  pravastatin (PRAVACHOL) 40 MG tablet TAKE 1 TABLET BY MOUTH AT  BEDTIME 06/08/18  Yes [name], MD  ranolazine (RANEXA) 500 MG 12 hr tablet Take 1 tablet (500 mg total) by mouth 2 (two) times daily. 08/02/18  Yes Dunn, Areta Haber, PA-C  tamsulosin (FLOMAX) 0.4 MG CAPS capsule Take 1 capsule (0.4 mg total) by mouth daily. 06/15/18  Yes Stoioff, Ronda Fairly, MD  XARELTO 20 MG TABS tablet TAKE 1 TABLET DAILY 06/12/18  Yes Gollan, Kathlene November, MD  cyproheptadine (PERIACTIN) 4 MG tablet Take 1 tablet (4 mg total) by mouth 3 (three) times daily as needed for allergies. 02/17/18   [name], MD    Inpatient Medications: Scheduled Meds: . sodium chloride flush  3 mL Intravenous Once   Continuous Infusions: . sodium chloride    . sodium chloride     PRN Meds:   Allergies:    Allergies  Allergen Reactions  . Bee Venom Swelling  . Sulfasalazine Hives and Swelling  . Sulfa Antibiotics Hives and Swelling  . Warfarin And Related Other (See Comments)    Chest pain    Social History:   Social History   Socioeconomic History  .  Marital status: Married    Spouse name: Not on file  . Number of children: Not on file  . Years of education: Not on file  . Highest education level: Not on file  Occupational History  . Not on file  Social Needs  . Financial resource strain: Not on file  . Food insecurity:    Worry: Not on file    Inability: Not on file  . Transportation needs:    Medical:  Not on file    Non-medical: Not on file  Tobacco Use  . Smoking status: Never Smoker  . Smokeless tobacco: Never Used  Substance and Sexual Activity  . Alcohol use: No  . Drug use: No  . Sexual activity: Not Currently  Lifestyle  . Physical activity:    Days per week: Not on file    Minutes per session: Not on file  . Stress: Not on file  Relationships  . Social connections:    Talks on phone: Not on file    Gets together: Not on file    Attends religious service: Not on file    Active member of club or organization: Not on file    Attends meetings of clubs or organizations: Not on file    Relationship status: Not on file  . Intimate partner violence:    Fear of current or ex partner: Not on file    Emotionally abused: Not on file    Physically abused: Not on file    Forced sexual activity: Not on file  Other Topics Concern  . Not on file  Social History Narrative  . Not on file    Family History:    Family History  Problem Relation Age of Onset  . Stroke Mother   . Colon cancer Father   . Coronary artery disease Brother   . Other Brother 71       lymes dz     ROS:  Please see the history of present illness.   Review of Systems  Constitutional: Positive for malaise/fatigue and weight loss. Negative for chills, diaphoresis and fever.       12 lb weight loss reported in 1 month  Respiratory: Positive for shortness of breath. Negative for hemoptysis.        Shortness of breath reported with ambulation /DOE  Cardiovascular: Positive for leg swelling. Negative for chest pain and palpitations.        Occasional feeling of racing heart rate.  Reportedly wears compression stockings at home for lower extremity edema.  Gastrointestinal: Positive for nausea. Negative for abdominal pain, blood in stool, constipation, diarrhea, heartburn, melena and vomiting.       Denies any changes in bowel movement or urination  Genitourinary: Negative for dysuria, flank pain, frequency, hematuria and urgency.  Musculoskeletal: Negative for falls.       Denied syncope.  Only reported symptoms of presyncope.  Neurological: Positive for dizziness and weakness. Negative for loss of consciousness.       Reported lower extremity weakness with ambulation over the last 2 days.  Reported symptoms of presyncope, specifically when rising from seated to standing position.  Reports these symptoms happen even when moving slowly from 1 position to max.  Psychiatric/Behavioral: Negative for substance abuse. The patient does not have insomnia.   All other systems reviewed and are negative.   All other ROS reviewed and negative.     Physical Exam/Data:   Vitals:   09/16/18 1152 09/16/18 1153 09/16/18 1324 09/16/18 1500  BP: 91/61  (!) 160/95 (!) 156/94  Pulse: 78  66 64  Resp: 17  12 14   Temp: 97.9 F (36.6 C)     TempSrc: Oral     SpO2: 99%  98% 98%  Weight:  94.3 kg    Height:  6\' 4"  (1.93 m)      Intake/Output Summary (Last 24 hours) at 09/16/2018 1544 Last data filed at 09/16/2018 1353 Gross per 24 hour  Intake 1000  ml  Output -  Net 1000 ml   Filed Weights   09/16/18 1153  Weight: 94.3 kg   Body mass index is 25.32 kg/m.  General:  Frail, elderly male. Alert and oriented HEENT: normal Neck: no JVD Vascular: No carotid bruits; Radial pulses 2+ bilaterally without bruits  Cardiac:  normal S1, S2; RRR; no murmur  Lungs:  clear to auscultation bilaterally, no wheezing, rhonchi or rales.   Abd: soft, nontender, no hepatomegaly  Ext: no lower extremity edema Musculoskeletal:  No deformities, BUE and  BLE strength normal and equal Skin: warm and dry  Neuro:   no focal abnormalities noted Psych:  Normal affect   EKG: Refer to HPI EKG without acute changes and showed SR, 77bpm, 1st degree AVB  Telemetry:  Telemetry was personally reviewed and demonstrates: SR, 70s  CV Studies:   Relevant CV Studies: 09/10/2018 TTE Left ventricle: The cavity size was normal. There was mild   concentric hypertrophy. Systolic function was normal. The   estimated ejection fraction was in the range of 55% to 60%. Wall   motion was normal; there were no regional wall motion   abnormalities. Doppler parameters are consistent with abnormal   left ventricular relaxation (grade 1 diastolic dysfunction). - Aortic valve: There was trivial regurgitation. - Left atrium: The atrium was normal in size. - Right ventricle: Systolic function was normal. - Pulmonary arteries: Systolic pressure could not be accurately   estimated.  NM Study  09/19/2017 Pharmacological myocardial perfusion imaging study with no significant  ischemia Normal wall motion, EF estimated at 69% No EKG changes concerning for ischemia at peak stress or in recovery. Low risk scan  Echo from 02/2017  Showed an EF of 55-60%, mild to moderate LVH, no RWMA, Gr1DD, mildly dilated aortic root at 40 mm, no AI, moderately dilated left atrium.   2015 Cath (scanned in) Mid LAD-1 lesion 10% ISR, mid LAD-2 lesion 40%, D1-1 lesion 90%, D1-2 lesion 70%, OM1 80%, OM2 stent patent without residual restenosis.Medical management was advised.  Laboratory Data:  Chemistry Recent Labs  Lab 09/16/18 1203  NA 136  K 3.8  CL 103  CO2 23  GLUCOSE 125*  BUN 25*  CREATININE 1.43*  CALCIUM 9.5  GFRNONAA 44*  GFRAA 51*  ANIONGAP 10    No results for input(s): PROT, ALBUMIN, AST, ALT, ALKPHOS, BILITOT in the last 168 hours. Hematology Recent Labs  Lab 09/16/18 1203  WBC 7.6  RBC 2.99*  HGB 10.8*  HCT 30.7*  MCV 102.7*  MCH 36.1*  MCHC 35.2    RDW 13.2  PLT 288   Cardiac Enzymes Recent Labs  Lab 09/16/18 1203  TROPONINI <0.03   No results for input(s): TROPIPOC in the last 168 hours.  BNPNo results for input(s): BNP, PROBNP in the last 168 hours.  DDimer No results for input(s): DDIMER in the last 168 hours.  Radiology/Studies:  No results found.  Assessment and Plan:   Presyncope / orthostatic hypotension/ dehydration -Symptoms of chronic presyncope and chronic nausea (as in HPI).  Reduced intake of fluids and food with 12 pound weight loss over last month.  Hydration stressed at last office visit. Deconditioned -most days spent in bed and reports SOB/leg weakness on ambulation.   - In ED, SBP dropped significantly on orthostatics.  Provided IV fluids.  Continue to hydrate patient, as labs / story suspicious for dehydration. Counseled on hydration. As below, continue to monitor SBP while administering fluids, especially while lying down, given SBP  160-170s.  -Holding Cardizem d/t dropped pressures. Recommendation to continue remainder of PTA cardiac medications. If further drop in BP, consider holding or reducing dosage of lopressor. - Recommend repeat orthostatics following hydration.  Will need repeat a.m. BMET and CBC.  If improvement in SBP with hydration, recommend restarting Cardizem as heart rate and BP allow.   -Suspect that symptoms driven by reduced oral intake of food /fluids and exacerbated by taking medications known to drop BP all at once while dehydrated. Suspicion low for arrhythmia or other cardiac etiology at this time given patient story, lab work, and with previous work-up / CV studies above.  Continue to monitor on telemetry.  Further recommendations following repeat orthostatics and hydration.  Hypertension / Orthostatic Hypotension -As above, patient hypertensive when lying down and hypotensive when sitting and standing up.  Dehydration suspected. Hydration with IVF started with recommendation to monitor  high SBP as lying SBP already 160s-170s.  If further increase in SBP while lying, recommend hold fluids. Continue to monitor heart rate on telemetry and vitals. Repeat orthostatics as above  PAF -Currently sinus rhythm.  PTA amiodarone, Lopressor, and Xarelto. Continue at this time with recommendation to hold lopressor if bradycardic or concerning hypotension.   CAD - No current CP. Intolerant to isosorbide mononitrate. Holding PTA cardizem. Continue PTA pravastatin, metoprolol, and ranexa. No plans for invasive ischemic workup at this time. Most recent CV studies above.  Cold agglutinin hemolytic anemia -Followed by hematology   For questions or updates, please contact [place] Please consult [email or web address] for contact info under     Signed, Arvil Chaco, PA-C  09/16/2018 3:44 PM

## 2018-09-16 NOTE — Telephone Encounter (Signed)
Patient was seen in office upon leaving patient fell at front foyer. Patient stated he bentover to pick up some paper and fell . Neurological assessment completed patient oriented to person, place, and time. Pupils equal and reactive, Moved patient to room attained vitals  BP 104/60 Pulse 74 02 sats @99  respirations 18. Attained Orthostatic vitals,  Lying 130/88 Pulse 74, sitting BP 90/78 Pulse 71 , standing BP 90/60 pulse 84. NP in room advised patient to go to ER patient agreed , called and notified ER triage nurse.

## 2018-09-16 NOTE — Progress Notes (Signed)
Family Meeting Note  Advance Directive:yes  Today a meeting took place with the Patient and spouse.   The following clinical team members were present during this meeting:MD  The following were discussed:Patient's diagnosis: Orthostatic hypotension, coronary artery disease, paroxysmal atrial fibrillation, hypertension, hyperlipidemia, Patient's progosis: Unable to determine and Goals for treatment: DNR  Additional follow-up to be provided: Cardiologist  Time spent during discussion:20 minutes  William Basta, MD

## 2018-09-16 NOTE — ED Notes (Signed)
Admitting MD at the bedside.  

## 2018-09-16 NOTE — ED Notes (Signed)
Date and time results received: 09/16/18 2:42 PM   Test: INR Critical Value: 4.03  Name of Provider Notified: Dr. Clayton Bibles (epic notification)

## 2018-09-16 NOTE — ED Provider Notes (Signed)
[facility] Emergency Department Provider Note  ____________________________________________   I have reviewed the triage vital signs and the nursing notes. Where available I have reviewed prior notes and, if possible and indicated, outside hospital notes.    HISTORY  Chief Complaint Dizziness and Hypotension    HPI William Taylor is a 85 y.o. male   with a history of hyperlipidemia hypertension MI in 2015, on Xarelto for PAF, history of prostate issues, states that he is feeling more lightheaded than normal over the last couple weeks.  Was seen here for groin pain a week and a half ago, which he states is better.  That was a chronic problem.  He states since going home however he has been more lightheaded.  He went to his primary care doctors and he bent over to pick something up and he went down to his knees.  Did not injure himself or hit his head or pass out.  However that alerted them to the need to bring him to the emergency room.  Patient has had difficulty getting out of bed this week, patient is on blood pressure medications has seen his cardiologist for this, has seen ENT for this and his primary care doctor for his lightheadedness, they did apparently reduce his metoprolol several weeks ago that has not affected it.  He is most lightheaded when he stands up.  He denies any focal numbness or weakness and he denies any vertigo at this time.  Right extensive outpatient work-up including ENT and cardiology and primary care, patient still remains lightheaded, is not eating or drinking very much and feels generally weak.  Family do not feel that he is safe to go home. Also seen GI medicine in the last few days.  He did have a echo done on the 23rd of this month of his heart, which showed EF of 55 to 60% with   Past Medical History:  Diagnosis Date  . Cancer [facility])    Prostate, followed by Dr. Jacqlyn Larsen  . Colon polyp   . Coronary artery disease   .  Hyperlipidemia   . Hypertension   . MI (myocardial infarction) ([place])    2015  . PAF (paroxysmal atrial fibrillation) ([place])    a. on xarelto  . Skin cancer     Patient Active Problem List   Diagnosis Date Noted  . Orthostatic hypotension 06/06/2018  . Chronic pain of left knee 06/05/2018  . Chronic pain in testicle 06/05/2018  . Chronic back pain 02/17/2018  . Chest pain 03/07/2017  . Angiodysplasia of intestinal tract   . Iron deficiency anemia   . Benign neoplasm of ascending colon   . Diverticulosis of large intestine without diverticulitis   . Gastric polyp   . Left rotator cuff tear arthropathy 09/11/2015  . Fatigue 04/15/2014  . Groin pain, chronic, left 01/18/2014  . Prostate cancer ([place]) 03/15/2013  . DOE (dyspnea on exertion) 11/23/2012  . Insomnia 09/03/2012  . Essential hypertension 07/23/2012  . Chronic kidney disease (CKD), stage III (moderate) ([place]) 07/23/2011  . Pulmonary embolism ([place]) 01/09/2011  . Paroxysmal A-fib ([place]) 09/06/2010  . Hyperlipidemia 05/19/2009  . Coronary artery disease of native artery of native heart with stable angina pectoris ([place]) 05/19/2009    Past Surgical History:  Procedure Laterality Date  . CARDIAC CATHETERIZATION  10/2013   [facility]  . CATARACT EXTRACTION  Oct. 3, 2012   right eye  . colonoscopy    . COLONOSCOPY W/  POLYPECTOMY  2015   Dr Rayann Heman  . COLONOSCOPY WITH PROPOFOL N/A 08/15/2016   Procedure: COLONOSCOPY WITH PROPOFOL;  Surgeon: [name], MD;  Location[facility] ENDOSCOPY;  Service: Endoscopy;  Laterality: N/A;  . CORONARY ANGIOPLASTY  2009   2005; s/p stent  . ESOPHAGOGASTRODUODENOSCOPY (EGD) WITH PROPOFOL N/A 08/15/2016   Procedure: ESOPHAGOGASTRODUODENOSCOPY (EGD) WITH PROPOFOL;  Surgeon: [name], MD;  Location: [facility] ENDOSCOPY;  Service: Endoscopy;  Laterality: N/A;  . GIVENS CAPSULE STUDY N/A 09/26/2016   Procedure: GIVENS CAPSULE STUDY;  Surgeon: [name], MD;  Location: [facility] ENDOSCOPY;  Service: Endoscopy;   Laterality: N/A;  . HERNIA REPAIR  2013  . LUMBAR SPINE SURGERY    . UPPER GI ENDOSCOPY  Sept 2015   Dr Rayann Heman    Prior to Admission medications   Medication Sig Start Date End Date Taking? Authorizing Provider  amiodarone (PACERONE) 200 MG tablet TAKE 1 TABLET BY MOUTH  DAILY AS NEEDED 04/14/18   [name], MD  cyproheptadine (PERIACTIN) 4 MG tablet Take 1 tablet (4 mg total) by mouth 3 (three) times daily as needed for allergies. 02/17/18   [name], MD  diltiazem (CARDIZEM CD) 120 MG 24 hr capsule TAKE 1 CAPSULE BY MOUTH  DAILY 04/15/18   [name], MD  esomeprazole (NEXIUM) 40 MG capsule TAKE 1 CAPSULE BY MOUTH  DAILY AT 12 NOON 09/01/18   [name], MD  fenofibrate micronized (ANTARA) 130 MG capsule TAKE 1 CAPSULE BY MOUTH  DAILY BEFORE BREAKFAST 08/24/18   [name], MD  finasteride (PROSCAR) 5 MG tablet Take 1 tablet (5 mg total) by mouth daily. 02/05/18   Stoioff, Ronda Fairly, MD  levobunolol (BETAGAN) 0.5 % ophthalmic solution Place 1 drop into the right eye 2 (two) times daily.     [provider]  Levothyroxine Sodium 25 MCG CAPS Take 1 capsule (25 mcg total) by mouth daily before breakfast. 08/28/18   Guse, Jacquelynn Cree, FNP  metoprolol tartrate (LOPRESSOR) 25 MG tablet Take 0.5 tablets (12.5 mg total) by mouth 2 (two) times daily. 08/17/18   Dunn, Areta Haber, PA-C  nitroGLYCERIN (NITROSTAT) 0.4 MG SL tablet DISSOLVE UNDER THE TONGUE 1 TABLET EVERY 5 MINUTES AS NEEDED FOR CHEST PAIN 06/11/16   [name], MD  Omega-3 Fatty Acids (FISH OIL) 1000 MG CAPS Take 1,000 mg by mouth daily.    [provider]  ondansetron (ZOFRAN-ODT) 8 MG disintegrating tablet Take 0.5 tablets (4 mg total) by mouth every 8 (eight) hours as needed for nausea. 09/16/18   Guse, Jacquelynn Cree, FNP  polyethylene glycol powder (GLYCOLAX/MIRALAX) powder Take 17 g by mouth 2 (two) times daily as needed. 06/05/18   [name], MD  pravastatin (PRAVACHOL) 40 MG tablet  TAKE 1 TABLET BY MOUTH AT  BEDTIME 06/08/18   [name], MD  ranolazine (RANEXA) 500 MG 12 hr tablet Take 1 tablet (500 mg total) by mouth 2 (two) times daily. 08/02/18   Rise Mu, PA-C  tamsulosin (FLOMAX) 0.4 MG CAPS capsule Take 1 capsule (0.4 mg total) by mouth daily. 06/15/18   Stoioff, Ronda Fairly, MD  XARELTO 20 MG TABS tablet TAKE 1 TABLET DAILY 06/12/18   [name], MD    Allergies Bee venom; Sulfasalazine; Sulfa antibiotics; and Warfarin and related  Family History  Problem Relation Age of Onset  . Stroke Mother   . Colon cancer Father   . Coronary artery disease Brother   . Other  Brother 22       lymes dz    Social History Social History   Tobacco Use  . Smoking status: Never Smoker  . Smokeless tobacco: Never Used  Substance Use Topics  . Alcohol use: No  . Drug use: No    Review of Systems Constitutional: No fever/chills Eyes: No visual changes. ENT: No sore throat. No stiff neck no neck pain Cardiovascular: Denies chest pain. Respiratory: Denies shortness of breath. Gastrointestinal:   no vomiting.  No diarrhea.  No constipation. Genitourinary: Negative for dysuria. Musculoskeletal: Negative lower extremity swelling Skin: Negative for rash. Neurological: Negative for severe headaches, focal weakness or numbness.   ____________________________________________   PHYSICAL EXAM:  VITAL SIGNS: ED Triage Vitals  Enc Vitals Group     BP 09/16/18 1152 91/61     Pulse Rate 09/16/18 1152 78     Resp 09/16/18 1152 17     Temp 09/16/18 1152 97.9 F (36.6 C)     Temp Source 09/16/18 1152 Oral     SpO2 09/16/18 1152 99 %     Weight 09/16/18 1153 208 lb (94.3 kg)     Height 09/16/18 1153 6\' 4"  (1.93 m)     Head Circumference --      Peak Flow --      Pain Score 09/16/18 1153 0     Pain Loc --      Pain Edu? --      Excl. in [place]? --     Constitutional: Alert and oriented. Well appearing and in no acute distress. Eyes: Conjunctivae are  normal Head: Atraumatic HEENT: No congestion/rhinnorhea. Mucous membranes are moist.  Oropharynx non-erythematous Neck:   Nontender with no meningismus, no masses, no stridor Cardiovascular: Normal rate, regular rhythm. Grossly normal heart sounds.  Good peripheral circulation. Respiratory: Normal respiratory effort.  No retractions. Lungs CTAB. Abdominal: Soft and nontender. No distention. No guarding no rebound Back:  There is no focal tenderness or step off.  there is no midline tenderness there are no lesions noted. there is no CVA tenderness Musculoskeletal: No lower extremity tenderness, no upper extremity tenderness. No joint effusions, no DVT signs strong distal pulses no edema Neurologic:  Normal speech and language. No gross focal neurologic deficits are appreciated.  Skin:  Skin is warm, dry and intact. No rash noted. Psychiatric: Mood and affect are normal. Speech and behavior are normal.  ____________________________________________   LABS (all labs ordered are listed, but only abnormal results are displayed)  Labs Reviewed  BASIC METABOLIC PANEL - Abnormal; Notable for the following components:      Result Value   Glucose, Bld 125 (*)    BUN 25 (*)    Creatinine, Ser 1.43 (*)    GFR calc non Af Amer 44 (*)    GFR calc Af Amer 51 (*)    All other components within normal limits  CBC - Abnormal; Notable for the following components:   RBC 2.99 (*)    Hemoglobin 10.8 (*)    HCT 30.7 (*)    MCV 102.7 (*)    MCH 36.1 (*)    All other components within normal limits  URINALYSIS, COMPLETE (UACMP) WITH MICROSCOPIC  PROTIME-INR  CBG MONITORING, ED    Pertinent labs  results that were available during my care of the patient were reviewed by me and considered in my medical decision making (see chart for details). ____________________________________________  EKG  I personally interpreted any EKGs ordered by me or triage Sinus  rhythm rate 77 first-degree AV block, no  acute ST elevation or depression, borderline LAD. ____________________________________________  RADIOLOGY  Pertinent labs & imaging results that were available during my care of the patient were reviewed by me and considered in my medical decision making (see chart for details). If possible, patient and/or family made aware of any abnormal findings.  No results found. ____________________________________________    PROCEDURES  Procedure(s) performed: None  Procedures  Critical Care performed: None  ____________________________________________   INITIAL IMPRESSION / ASSESSMENT AND PLAN / ED COURSE  Pertinent labs & imaging results that were available during my care of the patient were reviewed by me and considered in my medical decision making (see chart for details).  Patient here feeling generally weak.  After an IV fluid back here, he is still orthostatic, this likely is a blood pressure medication problem in my opinion although is unclear what other possibilities there are.  He has had extensive work-up for this for multiple different providers and is not doing well at home I think we do need to admit him for further hydration, gently, and further evaluation.  He is orthostatically hypotensive, unable to perform activities of daily life and is losing weight    ____________________________________________   FINAL CLINICAL IMPRESSION(S) / ED DIAGNOSES  Final diagnoses:  Dehydration  Orthostatic hypotension      This chart was dictated using voice recognition software.  Despite best efforts to proofread,  errors can occur which can change meaning.      Schuyler Amor, MD 09/16/18 1419

## 2018-09-16 NOTE — Plan of Care (Signed)
  Problem: Safety: Goal: Ability to remain free from injury will improve Outcome: Progressing   

## 2018-09-16 NOTE — ED Notes (Signed)
Pt is here for generalized weakness and illness since christmas.  Pt has been feeling ill and has been staying in bed per wife. She states that he has just not been well and "only been out of bed 2 days this year" she also states that the days after the days that he was up and about he was completely exhausted.  No focal deficits.  Pt has had some dizziness and generalized weakness.  Urinal given, asked pt for a sample

## 2018-09-16 NOTE — ED Triage Notes (Signed)
Pt was sent from PCP for weakness . Pt states he has been weak an dizzy with nausea since christmas,   Lost 12lbs since christmas. Pt states was at the PCP for a referral to neurologist and went to pick up something from the floor at they were leaving and fell and was too weak to get back up on his own, pt is a/ox4 at present.

## 2018-09-16 NOTE — Progress Notes (Signed)
Subjective:    Patient ID: William Taylor, male    DOB: September 30, 1929, 85 y.o.   MRN: [id number]  HPI   Patient has been struggling with feeling of dizziness which then causes nausea since the end of December 2019/January 2020.   Patient's lab work that has been done over the past month has all remained stable.  Metoprolol decreased from 25 BID to 12.5 BID by cardiology recently.  Has seenENT, oncology in January 2020. Will see ENT again friday  Upcoming GI and cardiology appt next week.  Neurology referral in place currently.  Patient has not had imaging of brain recently.  Wife states patient will eat his breakfast of sausage and biscuit, tea but other than that does not eat much throughout the day due to feeling nauseous. Denies ringing or buzzing in ears. Patient was prescribed Zofran and advised to try taking this half an hour before eating, states this only seems to help minimally.  States when he stands up and begins to walk room will feel as if it is spinning and then he will become nauseous and have to lay down to resolve symptoms.  Patient is also tried meclizine without much help in reducing symptoms.  Patient Active Problem List   Diagnosis Date Noted  . Orthostatic hypotension 06/06/2018  . Chronic pain of left knee 06/05/2018  . Chronic pain in testicle 06/05/2018  . Chronic back pain 02/17/2018  . Chest pain 03/07/2017  . Angiodysplasia of intestinal tract   . Iron deficiency anemia   . Benign neoplasm of ascending colon   . Diverticulosis of large intestine without diverticulitis   . Gastric polyp   . Left rotator cuff tear arthropathy 09/11/2015  . Fatigue 04/15/2014  . Groin pain, chronic, left 01/18/2014  . Prostate cancer ([place]) 03/15/2013  . DOE (dyspnea on exertion) 11/23/2012  . Insomnia 09/03/2012  . Essential hypertension 07/23/2012  . Chronic kidney disease (CKD), stage III (moderate) ([place]) 07/23/2011  . Pulmonary embolism ([place]) 01/09/2011  .  Paroxysmal A-fib ([place]) 09/06/2010  . Hyperlipidemia 05/19/2009  . Coronary artery disease of native artery of native heart with stable angina pectoris ([place]) 05/19/2009   Social History   Tobacco Use  . Smoking status: Never Smoker  . Smokeless tobacco: Never Used  Substance Use Topics  . Alcohol use: No    Review of Systems  Constitutional: Negative for chills, fatigue and fever.  HENT: Negative for congestion, ear pain, sinus pain and sore throat.   Eyes: Negative.   Respiratory: Negative for cough, shortness of breath and wheezing.   Cardiovascular: Negative for chest pain, palpitations and leg swelling.  Gastrointestinal: +nausea. Negative for abdominal pain, diarrhea, and vomiting.  Genitourinary: Negative for dysuria, frequency and urgency.  Musculoskeletal: Negative for arthralgias and myalgias.  Skin: Negative for color change, pallor and rash.  Neurological: Negative for syncope, and headaches. +dizziness Psychiatric/Behavioral: The patient is not nervous/anxious.       Objective:   Physical Exam Vitals signs and nursing note reviewed.  Constitutional:      General: He is not in acute distress.    Appearance: He is not toxic-appearing or diaphoretic.  HENT:     Head: Normocephalic and atraumatic.     Right Ear: Tympanic membrane, ear canal and external ear normal. There is no impacted cerumen.     Left Ear: Tympanic membrane, ear canal and external ear normal. There is no impacted cerumen.     Nose: Nose normal.  Mouth/Throat:     Mouth: Mucous membranes are moist.  Eyes:     General: No scleral icterus.       Right eye: No discharge.        Left eye: No discharge.     Extraocular Movements: Extraocular movements intact.     Conjunctiva/sclera: Conjunctivae normal.     Pupils: Pupils are equal, round, and reactive to light.  Neck:     Musculoskeletal: Neck supple. No neck rigidity.  Cardiovascular:     Rate and Rhythm: Normal rate and regular rhythm.      Heart sounds: Normal heart sounds. No murmur.  Pulmonary:     Effort: Pulmonary effort is normal. No respiratory distress.     Breath sounds: Normal breath sounds.  Musculoskeletal:     Right lower leg: No edema.     Left lower leg: No edema.  Skin:    General: Skin is warm.  Neurological:     Mental Status: He is alert and oriented to person, place, and time.     Gait: Gait (No swaying with walking) normal.  Psychiatric:        Mood and Affect: Mood normal.        Behavior: Behavior normal.       Wt Readings from Last 3 Encounters:  09/16/18 208 lb 3.2 oz (94.4 kg)  09/08/18 211 lb (95.7 kg)  09/05/18 212 lb (96.2 kg)   Vitals:   09/16/18 1005  BP: 122/82  Pulse: 77  Resp: 18  Temp: 97.8 F (36.6 C)  SpO2: 98%   Orthostatics vitals performed : patient is orthostatic, BP does go down with position change     Assessment & Plan:    A total of 25  minutes were spent face-to-face with the patient during this encounter and over half of that time was spent on counseling and coordination of care. The patient was counseled on possible dizziness causes, recent specialist visits, medications.    Persistent vertigo, nausea, no appetite, orthostatic hypotension- it is possible vertigo is related to different cardiac medications patient is taking and orthostatic hypotension.  Metoprolol was recently decreased from 25 twice daily to 12.5 mg twice daily and patient has been taking the 12.5 mg twice daily dose.  It is also possible vertigo is related to inner ear issue or something pressing on a cranial nerve, we will get MRI to further investigate.  Patient will use Zofran as needed to help treat nausea, advised to keep self well-hydrated with water and Gatorade.  Upon leaving the office, patient dropped his after visit summary on the floor, bent forward to pick it up and felt dizzy, fell down to the ground landing on his knee, then sat on the ground.  Patient did not hit his head.   Discussed options with patient and wife and due to orthostatic blood pressures patient agreeable to go to emergency room for evaluation and possible IV fluids.

## 2018-09-17 DIAGNOSIS — I25118 Atherosclerotic heart disease of native coronary artery with other forms of angina pectoris: Secondary | ICD-10-CM | POA: Diagnosis not present

## 2018-09-17 DIAGNOSIS — K921 Melena: Secondary | ICD-10-CM | POA: Diagnosis not present

## 2018-09-17 DIAGNOSIS — I10 Essential (primary) hypertension: Secondary | ICD-10-CM | POA: Diagnosis not present

## 2018-09-17 DIAGNOSIS — E860 Dehydration: Secondary | ICD-10-CM | POA: Diagnosis not present

## 2018-09-17 DIAGNOSIS — E785 Hyperlipidemia, unspecified: Secondary | ICD-10-CM | POA: Diagnosis not present

## 2018-09-17 DIAGNOSIS — I2510 Atherosclerotic heart disease of native coronary artery without angina pectoris: Secondary | ICD-10-CM | POA: Diagnosis not present

## 2018-09-17 DIAGNOSIS — E440 Moderate protein-calorie malnutrition: Secondary | ICD-10-CM | POA: Diagnosis not present

## 2018-09-17 DIAGNOSIS — I480 Paroxysmal atrial fibrillation: Secondary | ICD-10-CM | POA: Diagnosis not present

## 2018-09-17 DIAGNOSIS — R110 Nausea: Secondary | ICD-10-CM | POA: Diagnosis not present

## 2018-09-17 DIAGNOSIS — I129 Hypertensive chronic kidney disease with stage 1 through stage 4 chronic kidney disease, or unspecified chronic kidney disease: Secondary | ICD-10-CM | POA: Diagnosis not present

## 2018-09-17 DIAGNOSIS — Z8719 Personal history of other diseases of the digestive system: Secondary | ICD-10-CM | POA: Diagnosis not present

## 2018-09-17 DIAGNOSIS — I951 Orthostatic hypotension: Secondary | ICD-10-CM | POA: Diagnosis not present

## 2018-09-17 DIAGNOSIS — R55 Syncope and collapse: Secondary | ICD-10-CM | POA: Diagnosis not present

## 2018-09-17 DIAGNOSIS — I252 Old myocardial infarction: Secondary | ICD-10-CM | POA: Diagnosis not present

## 2018-09-17 DIAGNOSIS — D591 Other autoimmune hemolytic anemias: Secondary | ICD-10-CM | POA: Diagnosis not present

## 2018-09-17 DIAGNOSIS — E782 Mixed hyperlipidemia: Secondary | ICD-10-CM | POA: Diagnosis not present

## 2018-09-17 DIAGNOSIS — R634 Abnormal weight loss: Secondary | ICD-10-CM | POA: Diagnosis not present

## 2018-09-17 DIAGNOSIS — D649 Anemia, unspecified: Secondary | ICD-10-CM | POA: Diagnosis not present

## 2018-09-17 LAB — PROTIME-INR
INR: 2.25
Prothrombin Time: 24.6 seconds — ABNORMAL HIGH (ref 11.4–15.2)

## 2018-09-17 LAB — CBC
HCT: 26.7 % — ABNORMAL LOW (ref 39.0–52.0)
Hemoglobin: 9.5 g/dL — ABNORMAL LOW (ref 13.0–17.0)
MCH: 37.5 pg — ABNORMAL HIGH (ref 26.0–34.0)
MCHC: 35.6 g/dL (ref 30.0–36.0)
MCV: 105.5 fL — AB (ref 80.0–100.0)
NRBC: 0 % (ref 0.0–0.2)
Platelets: 232 10*3/uL (ref 150–400)
RBC: 2.53 MIL/uL — ABNORMAL LOW (ref 4.22–5.81)
RDW: 13.2 % (ref 11.5–15.5)
WBC: 6.9 10*3/uL (ref 4.0–10.5)

## 2018-09-17 LAB — BASIC METABOLIC PANEL
Anion gap: 6 (ref 5–15)
BUN: 19 mg/dL (ref 8–23)
CO2: 24 mmol/L (ref 22–32)
Calcium: 8.8 mg/dL — ABNORMAL LOW (ref 8.9–10.3)
Chloride: 107 mmol/L (ref 98–111)
Creatinine, Ser: 1.23 mg/dL (ref 0.61–1.24)
GFR calc non Af Amer: 53 mL/min — ABNORMAL LOW (ref >60)
Glucose, Bld: 100 mg/dL — ABNORMAL HIGH (ref 70–99)
Potassium: 3.7 mmol/L (ref 3.5–5.1)
Sodium: 137 mmol/L (ref 135–145)

## 2018-09-17 MED ORDER — RIVAROXABAN 20 MG PO TABS
20.0000 mg | ORAL_TABLET | Freq: Every day | ORAL | Status: DC
  Administered 2018-09-18 – 2018-09-19 (×2): 20 mg via ORAL
  Filled 2018-09-17 (×2): qty 1

## 2018-09-17 MED ORDER — LEVOTHYROXINE SODIUM 50 MCG PO TABS
50.0000 ug | ORAL_TABLET | Freq: Every day | ORAL | Status: DC
  Administered 2018-09-18 – 2018-09-23 (×6): 50 ug via ORAL
  Filled 2018-09-17 (×6): qty 1

## 2018-09-17 MED ORDER — MIDODRINE HCL 5 MG PO TABS
5.0000 mg | ORAL_TABLET | Freq: Three times a day (TID) | ORAL | Status: DC
  Administered 2018-09-17 – 2018-09-18 (×3): 5 mg via ORAL
  Filled 2018-09-17 (×3): qty 1

## 2018-09-17 MED ORDER — ENSURE ENLIVE PO LIQD
237.0000 mL | Freq: Two times a day (BID) | ORAL | Status: DC
  Administered 2018-09-17 – 2018-09-20 (×3): 237 mL via ORAL

## 2018-09-17 MED ORDER — RANOLAZINE ER 500 MG PO TB12
500.0000 mg | ORAL_TABLET | Freq: Two times a day (BID) | ORAL | Status: DC
  Administered 2018-09-17 – 2018-09-23 (×12): 500 mg via ORAL
  Filled 2018-09-17 (×13): qty 1

## 2018-09-17 NOTE — Plan of Care (Signed)
Patient is positive for orthostatics. Patient has dizziness with standing. No complaints of pain. Patient was able to tolerate standing with staff assistance.

## 2018-09-17 NOTE — Care Management Note (Signed)
Case Management Note  Patient Details  Name: DETAVIOUS RINN MRN: [id number] Date of Birth: 03-12-1930  Subjective/Objective:    Patient is from home with wife.  He is independent in all ADL's.  Placed in observation for orthostatic hypotension.  States he has been having this problem for around a month.  Current with PCP.  Obtains medications at [facility] without difficulty. No needs identified at this time.  Offered HH services; patient declines at this time.                  Action/Plan:   Expected Discharge Date:                  Expected Discharge Plan:  Home/Self Care  In-House Referral:     Discharge planning Services  CM Consult  Post Acute Care Choice:    Choice offered to:     DME Arranged:    DME Agency:     HH Arranged:  Patient Refused [place] Agency:     Status of Service:  Completed, signed off  If discussed at [employer] of Stay Meetings, dates discussed:    Additional Comments:  Elza Rafter, RN 09/17/2018, 2:27 PM

## 2018-09-17 NOTE — Progress Notes (Signed)
Patient is being treated for orthostatic hypotension. Admitted for dizziness.

## 2018-09-17 NOTE — Progress Notes (Addendum)
Progress Note  Patient Name: William Taylor Date of Encounter: 09/17/2018  Primary Cardiologist: Dr. Rockey Situ  Subjective   Patient denies cardiac symptoms of chest pain, palpitations, racing heart rate. Notes improvement in nausea with addition of Zofran.  Patient reports continued issue with orthostatic hypotension. Specifically, he notes significant presyncope symptoms and instability upon standing.  Repeat orthostatics obtained after IVF continue to show orthostatic hypotension.  Per wife, patient was originally scheduled to see Dr. Rockey Situ on this upcoming Monday.  Wife reported that she canceled this appointment, as Dr. Rockey Situ would rather see the patient after seen by GI.  Inpatient Medications    Scheduled Meds: . amiodarone  200 mg Oral Daily  . feeding supplement (ENSURE ENLIVE)  237 mL Oral BID BM  . fenofibrate  160 mg Oral Daily  . finasteride  5 mg Oral Daily  . levobunolol  1 drop Right Eye BID  . [START ON 09/18/2018] levothyroxine  50 mcg Oral Q0600  . omega-3 acid ethyl esters  1 g Oral Daily  . ranolazine  500 mg Oral BID  . [START ON 09/18/2018] rivaroxaban  20 mg Oral Daily  . sodium chloride flush  3 mL Intravenous Once   Continuous Infusions: . sodium chloride    . sodium chloride 75 mL/hr at 09/16/18 2250   PRN Meds: cyproheptadine, docusate sodium, nitroGLYCERIN, ondansetron, polyethylene glycol   Vital Signs    Vitals:   09/17/18 0349 09/17/18 0757 09/17/18 0759 09/17/18 0802  BP: 135/73 133/85 123/77 (!) 70/51  Pulse: 62 61 67 81  Resp: 18 18    Temp: 98 F (36.7 C) (!) 97.4 F (36.3 C)    TempSrc: Oral Oral    SpO2: 98% 96% 98% 100%  Weight: 93.2 kg     Height:        Intake/Output Summary (Last 24 hours) at 09/17/2018 1246 Last data filed at 09/17/2018 1220 Gross per 24 hour  Intake 3840.29 ml  Output 750 ml  Net 3090.29 ml   Filed Weights   09/16/18 1153 09/17/18 0349  Weight: 94.3 kg 93.2 kg    Telemetry     SR, 60-70s.  Earlier episode of Afib, back in sinus rhythm with 1st degree AVB- Personally Reviewed  ECG    No new tracings - Personally Reviewed  Physical Exam   GEN: Frail, elderly male. No acute distress.  Lying in bed, accompanied by wife. Neck: No JVD Cardiac: RRR, no murmurs, rubs, or gallops.  Respiratory: Clear to auscultation bilaterally. GI: Soft, nontender, non-distended  MS: No lower extremity bilateral edema; No deformity. Neuro:  Nonfocal  Psych: Normal affect   Labs    Chemistry Recent Labs  Lab 09/16/18 1203 09/17/18 0414  NA 136 137  K 3.8 3.7  CL 103 107  CO2 23 24  GLUCOSE 125* 100*  BUN 25* 19  CREATININE 1.43* 1.23  CALCIUM 9.5 8.8*  GFRNONAA 44* 53*  GFRAA 51* >60  ANIONGAP 10 6     Hematology Recent Labs  Lab 09/16/18 1203 09/17/18 0414  WBC 7.6 6.9  RBC 2.99* 2.53*  HGB 10.8* 9.5*  HCT 30.7* 26.7*  MCV 102.7* 105.5*  MCH 36.1* 37.5*  MCHC 35.2 35.6  RDW 13.2 13.2  PLT 288 232    Cardiac Enzymes Recent Labs  Lab 09/16/18 1203  TROPONINI <0.03   No results for input(s): TROPIPOC in the last 168 hours.   BNPNo results for input(s): BNP, PROBNP in the last 168 hours.  DDimer No results for input(s): DDIMER in the last 168 hours.   Radiology    No results found.  Cardiac Studies   09/10/2018 TTE Left ventricle: The cavity size was normal. There was mild concentric hypertrophy. Systolic function was normal. The estimated ejection fraction was in the range of 55% to 60%. Wall motion was normal; there were no regional wall motion abnormalities. Doppler parameters are consistent with abnormal left ventricular relaxation (grade 1 diastolic dysfunction). - Aortic valve: There was trivial regurgitation. - Left atrium: The atrium was normal in size. - Right ventricle: Systolic function was normal. - Pulmonary arteries: Systolic pressure could not be accurately estimated.  NM Study  09/19/2017 Pharmacological myocardial  perfusion imaging study with no significant ischemia Normal wall motion, EF estimated at 69% No EKG changes concerning for ischemia at peak stress or in recovery. Low risk scan  Echo from 02/2017  Showed an EF of 55-60%, mild to moderate LVH, no RWMA, Gr1DD, mildly dilated aortic root at 40 mm, no AI, moderately dilated left atrium.   2015 Cath (scanned in) Mid LAD-1 lesion 10% ISR, mid LAD-2 lesion 40%, D1-1 lesion 90%, D1-2 lesion 70%, OM1 80%, OM2 stent patent without residual restenosis.Medical management was advised.  Patient Profile     85 y.o. male with a hx of CAD s/p LAD stenting 2005 PCI-OM2 2012, PAF (Xarelto), PE 2006 in [place], PE following catheterization 2012, atypical chest pain, CKD III, cold agglutinin hemolytic anemia, prostate CA, orthostatic hypotension, HTN, HLD who is being seen today for the evaluation of pre-syncope sx.  Assessment & Plan    Presyncope / orthostatic hypotension/ dehydration -Admitted with symptomatic orthostatic hypotension and chronic nausea (as in HPI), dehydration, deconditioning, and 12 lb weight loss over last month.  - Since admission, nausea and oral intake greatly improved with Zofran.  - SBP continues to drop on repeat orthostatics. Patient is very symptomatic when transitioning from the seated to standing position. - Continue gentle hydration.  - Continue to hold all antihypertensives.  - Started on midodrine 5mg  TID.  - Will need repeat a.m. BMET and CBC, as well as repeat orthostatics tomorrow. Continue to monitor on telemetry. Short run of atrial fibrillation earlier this morning but back in SR.  Continue to record vitals with addition of midodrine.  Orthostatic Hypotension -As above, hypotensive when sitting and standing up.  Elevated SBP controlled today. Holding antihypertensives. Continue midodrine as above. Continue to monitor heart rate on telemetry and vitals. Repeat orthostatics as above  PAF -Currently sinus rhythm.   Earlier run of Afib. Continue PTA amiodarone and Xarelto.   CAD - No current CP. Intolerant to isosorbide mononitrate. Holding PTA cardizem, lopressor. Continue PTA pravastatin, nitrates. No plans for invasive ischemic workup at this time. Most recent CV studies above.  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

## 2018-09-17 NOTE — Progress Notes (Signed)
[place] at [place] NAME: William Taylor    MR#:  [id number]  DATE OF BIRTH:  1930/04/24  SUBJECTIVE:  CHIEF COMPLAINT:   Chief Complaint  Patient presents with  . Dizziness  . Hypotension  Patient seen and evaluated today Gets dizzy whenever he stands up Orthostatic vitals checked this morning Has low blood pressure whenever he stands up  REVIEW OF SYSTEMS:    ROS  CONSTITUTIONAL: No documented fever. No fatigue, weakness. No weight gain, no weight loss.  EYES: No blurry or double vision.  ENT: No tinnitus. No postnasal drip. No redness of the oropharynx.  RESPIRATORY: No cough, no wheeze, no hemoptysis. No dyspnea.  CARDIOVASCULAR: No chest pain. No orthopnea. No palpitations. No syncope.  GASTROINTESTINAL: No nausea, no vomiting or diarrhea. No abdominal pain. No melena or hematochezia.  GENITOURINARY: No dysuria or hematuria.  ENDOCRINE: No polyuria or nocturia. No heat or cold intolerance.  HEMATOLOGY: No anemia. No bruising. No bleeding.  INTEGUMENTARY: No rashes. No lesions.  MUSCULOSKELETAL: No arthritis. No swelling. No gout.  NEUROLOGIC: No numbness, tingling, or ataxia. No seizure-type activity.  Has dizziness PSYCHIATRIC: No anxiety. No insomnia. No ADD.   DRUG ALLERGIES:   Allergies  Allergen Reactions  . Bee Venom Swelling  . Sulfasalazine Hives and Swelling  . Sulfa Antibiotics Hives and Swelling  . Warfarin And Related Other (See Comments)    Chest pain    VITALS:  Blood pressure (!) 70/51, pulse 81, temperature (!) 97.4 F (36.3 C), temperature source Oral, resp. rate 18, height 6\' 4"  (1.93 m), weight 93.2 kg, SpO2 100 %.  PHYSICAL EXAMINATION:   Physical Exam  GENERAL:  85 y.o.-year-old patient lying in the bed with no acute distress.  EYES: Pupils equal, round, reactive to light and accommodation. No scleral icterus. Extraocular muscles intact.  HEENT: Head atraumatic, normocephalic. Oropharynx and  nasopharynx clear.  NECK:  Supple, no jugular venous distention. No thyroid enlargement, no tenderness.  LUNGS: Normal breath sounds bilaterally, no wheezing, rales, rhonchi. No use of accessory muscles of respiration.  CARDIOVASCULAR: S1, S2 normal. No murmurs, rubs, or gallops.  ABDOMEN: Soft, nontender, nondistended. Bowel sounds present. No organomegaly or mass.  EXTREMITIES: No cyanosis, clubbing or edema b/l.    NEUROLOGIC: Cranial nerves II through XII are intact. No focal Motor or sensory deficits b/l.   PSYCHIATRIC: The patient is alert and oriented x 3.  SKIN: No obvious rash, lesion, or ulcer.   LABORATORY PANEL:   CBC Recent Labs  Lab 09/17/18 0414  WBC 6.9  HGB 9.5*  HCT 26.7*  PLT 232   ------------------------------------------------------------------------------------------------------------------ Chemistries  Recent Labs  Lab 09/17/18 0414  NA 137  K 3.7  CL 107  CO2 24  GLUCOSE 100*  BUN 19  CREATININE 1.23  CALCIUM 8.8*   ------------------------------------------------------------------------------------------------------------------  Cardiac Enzymes Recent Labs  Lab 09/16/18 1203  TROPONINI <0.03   ------------------------------------------------------------------------------------------------------------------  RADIOLOGY:  No results found.   ASSESSMENT AND PLAN:   85 year old male patient with history of prostate cancer, coronary disease, hyperlipidemia, hypertension, paroxysmal atrial fibrillation currently under hospitalist service for orthostatic hypotension  -Dizziness secondary to orthostatic hypotension IV fluids Hold blood pressure medication Continue amiodarone We will hold metoprolol Check orthostatic vitals daily  -Coronary artery disease Medical management  -Paroxysmal atrial fibrillation Continue Xarelto for anticoagulation Cardizem metoprolol on hold  -CKD stage III Monitor renal function  All the records are  reviewed and case discussed with Care Management/Social Worker. Management  plans discussed with the patient, family and they are in agreement.  CODE STATUS: Full code  DVT Prophylaxis: SCDs  TOTAL TIME TAKING CARE OF THIS PATIENT: 36 minutes.   POSSIBLE D/C IN 1 to 2 DAYS, DEPENDING ON CLINICAL CONDITION.  Saundra Shelling M.D on 09/17/2018 at 1:51 PM  Between 7am to 6pm - Pager - [phone]  After 6pm go to [email or web address] - password [email or web address] [facility]  Office  [phone]  CC: Primary care physician; [name], MD  Note: This dictation was prepared with Dragon dictation along with smaller phrase technology. Any transcriptional errors that result from this process are unintentional.

## 2018-09-18 DIAGNOSIS — R55 Syncope and collapse: Secondary | ICD-10-CM

## 2018-09-18 DIAGNOSIS — E782 Mixed hyperlipidemia: Secondary | ICD-10-CM

## 2018-09-18 LAB — BASIC METABOLIC PANEL
Anion gap: 6 (ref 5–15)
BUN: 19 mg/dL (ref 8–23)
CHLORIDE: 108 mmol/L (ref 98–111)
CO2: 24 mmol/L (ref 22–32)
Calcium: 8.9 mg/dL (ref 8.9–10.3)
Creatinine, Ser: 1.22 mg/dL (ref 0.61–1.24)
GFR calc Af Amer: >60 mL/min (ref >60)
GFR calc non Af Amer: 53 mL/min — ABNORMAL LOW (ref >60)
Glucose, Bld: 98 mg/dL (ref 70–99)
POTASSIUM: 3.7 mmol/L (ref 3.5–5.1)
Sodium: 138 mmol/L (ref 135–145)

## 2018-09-18 MED ORDER — MIDODRINE HCL 5 MG PO TABS
10.0000 mg | ORAL_TABLET | Freq: Three times a day (TID) | ORAL | Status: DC
  Administered 2018-09-18 – 2018-09-23 (×14): 10 mg via ORAL
  Filled 2018-09-18 (×15): qty 2

## 2018-09-18 MED ORDER — ADULT MULTIVITAMIN W/MINERALS CH
1.0000 | ORAL_TABLET | Freq: Every day | ORAL | Status: DC
  Administered 2018-09-18 – 2018-09-23 (×6): 1 via ORAL
  Filled 2018-09-18 (×6): qty 1

## 2018-09-18 NOTE — Plan of Care (Signed)
  Problem: Clinical Measurements: Goal: Ability to maintain clinical measurements within normal limits will improve Outcome: Progressing Goal: Cardiovascular complication will be avoided Outcome: Progressing   Problem: Activity: Goal: Risk for activity intolerance will decrease Outcome: Not Progressing Note:  Patient reports that he stills feel dizzy with standing

## 2018-09-18 NOTE — Progress Notes (Signed)
Progress Note  Patient Name: William Taylor Date of Encounter: 09/18/2018  Primary Cardiologist: Dr. Rockey Situ  Subjective   Continued orthostasis Pressures 130 down to 80 with standing for midodrine Started on midodrine 5 mg 3 times daily yesterday Pressures after 5 mg midodrine this morning systolic down to 90 with standing Eating a little bit more, still not regular amount per the patient  Inpatient Medications    Scheduled Meds: . amiodarone  200 mg Oral Daily  . feeding supplement (ENSURE ENLIVE)  237 mL Oral BID BM  . fenofibrate  160 mg Oral Daily  . finasteride  5 mg Oral Daily  . levobunolol  1 drop Right Eye BID  . levothyroxine  50 mcg Oral Q0600  . midodrine  10 mg Oral TID WC  . omega-3 acid ethyl esters  1 g Oral Daily  . ranolazine  500 mg Oral BID  . rivaroxaban  20 mg Oral Daily  . sodium chloride flush  3 mL Intravenous Once   Continuous Infusions: . sodium chloride    . sodium chloride 75 mL/hr at 09/18/18 0314   PRN Meds: cyproheptadine, docusate sodium, nitroGLYCERIN, ondansetron, polyethylene glycol   Vital Signs    Vitals:   09/18/18 0908 09/18/18 1032 09/18/18 1034 09/18/18 1035  BP: (!) 80/61 135/85 127/81 (!) 91/58  Pulse: 86 66 74 91  Resp:      Temp:      TempSrc:      SpO2:      Weight:      Height:        Intake/Output Summary (Last 24 hours) at 09/18/2018 1147 Last data filed at 09/18/2018 0944 Gross per 24 hour  Intake 833.39 ml  Output 200 ml  Net 633.39 ml   Filed Weights   09/16/18 1153 09/17/18 0349 09/18/18 0341  Weight: 94.3 kg 93.2 kg 92.9 kg    Telemetry     SR, 60-70s. Earlier episode of Afib, back in sinus rhythm with 1st degree AVB- Personally Reviewed  ECG    No new tracings - Personally Reviewed  Physical Exam   Constitutional: No distress, laying supine in bed Some temporal wasting HENT:  Head: Grossly normal Eyes:  no discharge. No scleral icterus.  Neck: No JVD, no carotid bruits    Cardiovascular: Regular rate and rhythm, no murmurs appreciated Pulmonary/Chest: Clear to auscultation bilaterally, no wheezes or rails Abdominal: Soft.  no distension.  no tenderness.  Musculoskeletal: Normal range of motion Neurological:  normal muscle tone. Coordination normal. No atrophy Skin: Skin warm and dry Psychiatric: normal affect, pleasant   Labs    Chemistry Recent Labs  Lab 09/16/18 1203 09/17/18 0414 09/18/18 0619  NA 136 137 138  K 3.8 3.7 3.7  CL 103 107 108  CO2 23 24 24   GLUCOSE 125* 100* 98  BUN 25* 19 19  CREATININE 1.43* 1.23 1.22  CALCIUM 9.5 8.8* 8.9  GFRNONAA 44* 53* 53*  GFRAA 51* >60 >60  ANIONGAP 10 6 6      Hematology Recent Labs  Lab 09/16/18 1203 09/17/18 0414  WBC 7.6 6.9  RBC 2.99* 2.53*  HGB 10.8* 9.5*  HCT 30.7* 26.7*  MCV 102.7* 105.5*  MCH 36.1* 37.5*  MCHC 35.2 35.6  RDW 13.2 13.2  PLT 288 232    Cardiac Enzymes Recent Labs  Lab 09/16/18 1203  TROPONINI <0.03   No results for input(s): TROPIPOC in the last 168 hours.   BNPNo results for input(s): BNP, PROBNP in  the last 168 hours.   DDimer No results for input(s): DDIMER in the last 168 hours.   Radiology    No results found.  Cardiac Studies   09/10/2018 TTE Left ventricle: The cavity size was normal. There was mild concentric hypertrophy. Systolic function was normal. The estimated ejection fraction was in the range of 55% to 60%. Wall motion was normal; there were no regional wall motion abnormalities. Doppler parameters are consistent with abnormal left ventricular relaxation (grade 1 diastolic dysfunction). - Aortic valve: There was trivial regurgitation. - Left atrium: The atrium was normal in size. - Right ventricle: Systolic function was normal. - Pulmonary arteries: Systolic pressure could not be accurately estimated.  NM Study  09/19/2017 Pharmacological myocardial perfusion imaging study with no significant ischemia Normal  wall motion, EF estimated at 69% No EKG changes concerning for ischemia at peak stress or in recovery. Low risk scan  Echo from 02/2017  Showed an EF of 55-60%, mild to moderate LVH, no RWMA, Gr1DD, mildly dilated aortic root at 40 mm, no AI, moderately dilated left atrium.   2015 Cath (scanned in) Mid LAD-1 lesion 10% ISR, mid LAD-2 lesion 40%, D1-1 lesion 90%, D1-2 lesion 70%, OM1 80%, OM2 stent patent without residual restenosis.Medical management was advised.  Patient Profile     85 y.o. male with a hx of CAD s/p LAD stenting 2005 PCI-OM2 2012, PAF (Xarelto), PE 2006 in [place], PE following catheterization 2012, atypical chest pain, CKD III, cold agglutinin hemolytic anemia, prostate CA, orthostatic hypotension, HTN, HLD who is being seen today for the evaluation of pre-syncope sx.  Assessment & Plan    Presyncope / orthostatic hypotension/ dehydration -Admitted with symptomatic orthostatic hypotension and chronic nausea (as in HPI), dehydration, deconditioning, and 12 lb weight loss over last month. --- Etiology unclear, likely neurocardiogenic cause Still with 40 point drop in systolic pressure on midodrine 5 mg We will increase midodrine up to 10 mg 3 times daily May need to add Florinef 1 mg daily if he continues to drop  Orthostatic Hypotension Likely exacerbated by recent 20 pound weight loss Suspect component of depression Recently seen by GI, Dr. [name] Patient reported having chronic nausea when he was eating.  Unclear if this is secondary to hypotension  PAF -Currently sinus rhythm.   amiodarone and Xarelto.  Cardizem and beta-blocker held  CAD Most of his blood pressure medications held in the setting of orthostasis including ACE inhibitor and beta-blocker Continue  Statin. Not on aspirin as he is on Xarelto Reports that Ranexa has helped his chronic chest pain -Would restart pravastatin once symptoms improved  BPH Flomax held for  hypotension/orthostasis  Cold agglutinin hemolytic anemia -Followed by hematology   Total encounter time more than 25 minutes  Greater than 50% was spent in counseling and coordination of care with the patient   For questions or updates, please contact [place] Please consult [email or web address] for contact info under        Signed, Ida Rogue, MD  09/18/2018, 11:47 AM

## 2018-09-18 NOTE — Progress Notes (Signed)
[place] at [place] NAME: Kani Chauvin    MR#:  [id number]  DATE OF BIRTH:  12/25/1929  SUBJECTIVE:  CHIEF COMPLAINT:   Chief Complaint  Patient presents with  . Dizziness  . Hypotension  Patient seen and evaluated today Gets dizzy whenever he stands up Orthostatic vitals checked this morning and had orthostatic hypotension this morning Oral midodrine has been started  REVIEW OF SYSTEMS:    ROS  CONSTITUTIONAL: No documented fever. No fatigue, weakness. No weight gain, no weight loss.  EYES: No blurry or double vision.  ENT: No tinnitus. No postnasal drip. No redness of the oropharynx.  RESPIRATORY: No cough, no wheeze, no hemoptysis. No dyspnea.  CARDIOVASCULAR: No chest pain. No orthopnea. No palpitations. No syncope.  GASTROINTESTINAL: No nausea, no vomiting or diarrhea. No abdominal pain. No melena or hematochezia.  GENITOURINARY: No dysuria or hematuria.  ENDOCRINE: No polyuria or nocturia. No heat or cold intolerance.  HEMATOLOGY: No anemia. No bruising. No bleeding.  INTEGUMENTARY: No rashes. No lesions.  MUSCULOSKELETAL: No arthritis. No swelling. No gout.  NEUROLOGIC: No numbness, tingling, or ataxia. No seizure-type activity.  Has dizziness PSYCHIATRIC: No anxiety. No insomnia. No ADD.   DRUG ALLERGIES:   Allergies  Allergen Reactions  . Bee Venom Swelling  . Sulfasalazine Hives and Swelling  . Sulfa Antibiotics Hives and Swelling  . Warfarin And Related Other (See Comments)    Chest pain    VITALS:  Blood pressure (!) 91/58, pulse 91, temperature 97.7 F (36.5 C), temperature source Oral, resp. rate 14, height 6\' 4"  (1.93 m), weight 92.9 kg, SpO2 99 %.  PHYSICAL EXAMINATION:   Physical Exam  GENERAL:  85 y.o.-year-old patient lying in the bed with no acute distress.  EYES: Pupils equal, round, reactive to light and accommodation. No scleral icterus. Extraocular muscles intact.  HEENT: Head atraumatic,  normocephalic. Oropharynx and nasopharynx clear.  NECK:  Supple, no jugular venous distention. No thyroid enlargement, no tenderness.  LUNGS: Normal breath sounds bilaterally, no wheezing, rales, rhonchi. No use of accessory muscles of respiration.  CARDIOVASCULAR: S1, S2 normal. No murmurs, rubs, or gallops.  ABDOMEN: Soft, nontender, nondistended. Bowel sounds present. No organomegaly or mass.  EXTREMITIES: No cyanosis, clubbing or edema b/l.    NEUROLOGIC: Cranial nerves II through XII are intact. No focal Motor or sensory deficits b/l.   PSYCHIATRIC: The patient is alert and oriented x 3.  SKIN: No obvious rash, lesion, or ulcer.   LABORATORY PANEL:   CBC Recent Labs  Lab 09/17/18 0414  WBC 6.9  HGB 9.5*  HCT 26.7*  PLT 232   ------------------------------------------------------------------------------------------------------------------ Chemistries  Recent Labs  Lab 09/18/18 0619  NA 138  K 3.7  CL 108  CO2 24  GLUCOSE 98  BUN 19  CREATININE 1.22  CALCIUM 8.9   ------------------------------------------------------------------------------------------------------------------  Cardiac Enzymes Recent Labs  Lab 09/16/18 1203  TROPONINI <0.03   ------------------------------------------------------------------------------------------------------------------  RADIOLOGY:  No results found.   ASSESSMENT AND PLAN:   85 year old male patient with history of prostate cancer, coronary disease, hyperlipidemia, hypertension, paroxysmal atrial fibrillation currently under hospitalist service for orthostatic hypotension  -Dizziness secondary to orthostatic hypotension IV fluids to continue Hold blood pressure medication Continue amiodarone We will hold metoprolol and Cardizem Check orthostatic vitals daily Oral midodrine added to support blood pressure and will increase the dose based on severity of orthostatic hypotension Could be from bad nausea which has been  chronic Outpatient GI follow-up Status post cardiology  evaluation  -Coronary artery disease Medical management  -Paroxysmal atrial fibrillation In sinus rhythm Continue Xarelto for anticoagulation Cardizem metoprolol on hold  -CKD stage III Monitor renal function  All the records are reviewed and case discussed with Care Management/Social Worker. Management plans discussed with the patient, family and they are in agreement.  CODE STATUS: Full code  DVT Prophylaxis: SCDs  TOTAL TIME TAKING CARE OF THIS PATIENT: 35 minutes.   POSSIBLE D/C IN 1 to 2 DAYS, DEPENDING ON CLINICAL CONDITION.  Saundra Shelling M.D on 09/18/2018 at 1:39 PM  Between 7am to 6pm - Pager - [phone]  After 6pm go to [email or web address] - password [email or web address] [facility]  Office  [phone]  CC: Primary care physician; [name], MD  Note: This dictation was prepared with Dragon dictation along with smaller phrase technology. Any transcriptional errors that result from this process are unintentional.

## 2018-09-18 NOTE — Progress Notes (Signed)
Initial Nutrition Assessment  DOCUMENTATION CODES:   Not applicable  INTERVENTION:   Ensure Enlive po BID, each supplement provides 350 kcal and 20 grams of protein  MVI daily   Liberalize diet   NUTRITION DIAGNOSIS:   Inadequate oral intake related to acute illness as evidenced by other (comment)(Per chart).  GOAL:   Patient will meet greater than or equal to 90% of their needs  MONITOR:   PO intake, Supplement acceptance, Labs, Weight trends, Skin, I & O's  REASON FOR ASSESSMENT:   Malnutrition Screening Tool    ASSESSMENT:   85 year old male patient with history of prostate cancer, coronary disease, hyperlipidemia, hypertension, paroxysmal atrial fibrillation currently under hospitalist service for orthostatic hypotension   Unable to see pt today x 2 visits. Per chart, pt with fair appetite and oral intake; pt eating 80% of meals today. Per chart, pt with 16lb(7%) wt loss over the past 2 months; this is significant given the time frame. RD will add Ensure and liberalize pt's diet to help pt meet his estimated needs. RD will obtain nutrition related history and exam at follow up. Of note, pt noted to have microcytic anemia; would recommend check B12 and folate labs.   Medications reviewed and include: synthroid, lovaza, NaCl @75ml /hr  Labs reviewed: Hgb 9.5(L), Hct 26.7(L), MCV 105.5(H), MCH 37.5(H) Folate 10.0, B12 321- 4/22   Diet Order:   Diet Order            Diet regular Room service appropriate? Yes; Fluid consistency: Thin  Diet effective now             EDUCATION NEEDS:   Not appropriate for education at this time  Skin:  Skin Assessment: Reviewed RN Assessment  Last BM:  1/28  Height:   Ht Readings from Last 1 Encounters:  09/16/18 6\' 4"  (1.93 m)    Weight:   Wt Readings from Last 1 Encounters:  09/18/18 92.9 kg    Ideal Body Weight:  91.8 kg  BMI:  Body mass index is 24.93 kg/m.  Estimated Nutritional Needs:   Kcal:   2200-2500kcal/day   Protein:  >120g/day   Fluid:  >2.2L/day   Koleen Distance MS, RD, LDN Pager #- [phone] Office#- [phone] After Hours Pager: [phone]

## 2018-09-19 DIAGNOSIS — Z823 Family history of stroke: Secondary | ICD-10-CM | POA: Diagnosis not present

## 2018-09-19 DIAGNOSIS — I2510 Atherosclerotic heart disease of native coronary artery without angina pectoris: Secondary | ICD-10-CM | POA: Diagnosis not present

## 2018-09-19 DIAGNOSIS — Z8249 Family history of ischemic heart disease and other diseases of the circulatory system: Secondary | ICD-10-CM | POA: Diagnosis not present

## 2018-09-19 DIAGNOSIS — N183 Chronic kidney disease, stage 3 (moderate): Secondary | ICD-10-CM | POA: Diagnosis not present

## 2018-09-19 DIAGNOSIS — Z9841 Cataract extraction status, right eye: Secondary | ICD-10-CM | POA: Diagnosis not present

## 2018-09-19 DIAGNOSIS — Z8546 Personal history of malignant neoplasm of prostate: Secondary | ICD-10-CM | POA: Diagnosis not present

## 2018-09-19 DIAGNOSIS — Z8719 Personal history of other diseases of the digestive system: Secondary | ICD-10-CM | POA: Diagnosis not present

## 2018-09-19 DIAGNOSIS — E785 Hyperlipidemia, unspecified: Secondary | ICD-10-CM | POA: Diagnosis not present

## 2018-09-19 DIAGNOSIS — I951 Orthostatic hypotension: Secondary | ICD-10-CM | POA: Diagnosis not present

## 2018-09-19 DIAGNOSIS — E440 Moderate protein-calorie malnutrition: Secondary | ICD-10-CM | POA: Diagnosis present

## 2018-09-19 DIAGNOSIS — Z882 Allergy status to sulfonamides status: Secondary | ICD-10-CM | POA: Diagnosis not present

## 2018-09-19 DIAGNOSIS — K5710 Diverticulosis of small intestine without perforation or abscess without bleeding: Secondary | ICD-10-CM | POA: Diagnosis not present

## 2018-09-19 DIAGNOSIS — I10 Essential (primary) hypertension: Secondary | ICD-10-CM | POA: Diagnosis not present

## 2018-09-19 DIAGNOSIS — Z7901 Long term (current) use of anticoagulants: Secondary | ICD-10-CM | POA: Diagnosis not present

## 2018-09-19 DIAGNOSIS — I252 Old myocardial infarction: Secondary | ICD-10-CM | POA: Diagnosis not present

## 2018-09-19 DIAGNOSIS — I480 Paroxysmal atrial fibrillation: Secondary | ICD-10-CM | POA: Diagnosis not present

## 2018-09-19 DIAGNOSIS — Z888 Allergy status to other drugs, medicaments and biological substances status: Secondary | ICD-10-CM | POA: Diagnosis not present

## 2018-09-19 DIAGNOSIS — I25118 Atherosclerotic heart disease of native coronary artery with other forms of angina pectoris: Secondary | ICD-10-CM | POA: Diagnosis present

## 2018-09-19 DIAGNOSIS — E039 Hypothyroidism, unspecified: Secondary | ICD-10-CM | POA: Diagnosis present

## 2018-09-19 DIAGNOSIS — K921 Melena: Secondary | ICD-10-CM | POA: Diagnosis not present

## 2018-09-19 DIAGNOSIS — I440 Atrioventricular block, first degree: Secondary | ICD-10-CM | POA: Diagnosis present

## 2018-09-19 DIAGNOSIS — Z800 Family history of malignant neoplasm of digestive organs: Secondary | ICD-10-CM | POA: Diagnosis not present

## 2018-09-19 DIAGNOSIS — E860 Dehydration: Secondary | ICD-10-CM | POA: Diagnosis present

## 2018-09-19 DIAGNOSIS — D591 Other autoimmune hemolytic anemias: Secondary | ICD-10-CM | POA: Diagnosis present

## 2018-09-19 DIAGNOSIS — K922 Gastrointestinal hemorrhage, unspecified: Secondary | ICD-10-CM | POA: Diagnosis not present

## 2018-09-19 DIAGNOSIS — I129 Hypertensive chronic kidney disease with stage 1 through stage 4 chronic kidney disease, or unspecified chronic kidney disease: Secondary | ICD-10-CM | POA: Diagnosis not present

## 2018-09-19 DIAGNOSIS — Z91030 Bee allergy status: Secondary | ICD-10-CM | POA: Diagnosis not present

## 2018-09-19 DIAGNOSIS — Z85828 Personal history of other malignant neoplasm of skin: Secondary | ICD-10-CM | POA: Diagnosis not present

## 2018-09-19 LAB — BASIC METABOLIC PANEL
Anion gap: 3 — ABNORMAL LOW (ref 5–15)
BUN: 16 mg/dL (ref 8–23)
CO2: 27 mmol/L (ref 22–32)
Calcium: 8.8 mg/dL — ABNORMAL LOW (ref 8.9–10.3)
Chloride: 108 mmol/L (ref 98–111)
Creatinine, Ser: 1.29 mg/dL — ABNORMAL HIGH (ref 0.61–1.24)
GFR calc Af Amer: 58 mL/min — ABNORMAL LOW (ref >60)
GFR calc non Af Amer: 50 mL/min — ABNORMAL LOW (ref >60)
Glucose, Bld: 95 mg/dL (ref 70–99)
Potassium: 4.1 mmol/L (ref 3.5–5.1)
Sodium: 138 mmol/L (ref 135–145)

## 2018-09-19 MED ORDER — FLUDROCORTISONE ACETATE 0.1 MG PO TABS
0.1000 mg | ORAL_TABLET | Freq: Every day | ORAL | Status: DC
  Administered 2018-09-19: 0.1 mg via ORAL
  Filled 2018-09-19 (×2): qty 1

## 2018-09-19 NOTE — Plan of Care (Signed)
  Problem: Pain Managment: Goal: General experience of comfort will improve Outcome: Progressing   Problem: Safety: Goal: Ability to remain free from injury will improve Outcome: Progressing   Problem: Skin Integrity: Goal: Risk for impaired skin integrity will decrease Outcome: Progressing   Problem: Clinical Measurements: Goal: Ability to maintain clinical measurements within normal limits will improve Outcome: Not Progressing Note:  Patient continues to be orthostatic positive.

## 2018-09-19 NOTE — Progress Notes (Signed)
Abdominal binder used while patient is up out of bed per mds order. Removed while in bed. Will continue to monitor

## 2018-09-19 NOTE — Progress Notes (Addendum)
[place] [facility] [place] NAME: William Taylor    MR#:  [id number]  DATE OF BIRTH:  Sep 03, 1929  SUBJECTIVE:  Complains of dizziness/lightheadedness with standing/ambulation, cardiology input appreciated, orthostatics noted-start Florinef  REVIEW OF SYSTEMS:    ROS  CONSTITUTIONAL: No documented fever. No fatigue, weakness. No weight gain, no weight loss.  EYES: No blurry or double vision.  ENT: No tinnitus. No postnasal drip. No redness of the oropharynx.  RESPIRATORY: No cough, no wheeze, no hemoptysis. No dyspnea.  CARDIOVASCULAR: No chest pain. No orthopnea. No palpitations. No syncope.  GASTROINTESTINAL: No nausea, no vomiting or diarrhea. No abdominal pain. No melena or hematochezia.  GENITOURINARY: No dysuria or hematuria.  ENDOCRINE: No polyuria or nocturia. No heat or cold intolerance.  HEMATOLOGY: No anemia. No bruising. No bleeding.  INTEGUMENTARY: No rashes. No lesions.  MUSCULOSKELETAL: No arthritis. No swelling. No gout.  NEUROLOGIC: No numbness, tingling, or ataxia. No seizure-type activity.  Has dizziness PSYCHIATRIC: No anxiety. No insomnia. No ADD.   DRUG ALLERGIES:   Allergies  Allergen Reactions  . Bee Venom Swelling  . Sulfasalazine Hives and Swelling  . Sulfa Antibiotics Hives and Swelling  . Warfarin And Related Other (See Comments)    Chest pain    VITALS:  Blood pressure (!) 86/49, pulse 93, temperature 98.4 F (36.9 C), temperature source Oral, resp. rate 18, height 6\' 4"  (1.93 m), weight 93.1 kg, SpO2 97 %.  PHYSICAL EXAMINATION:   Physical Exam  GENERAL:  85 y.o.-year-old patient lying in the bed with no acute distress.  EYES: Pupils equal, round, reactive to light and accommodation. No scleral icterus. Extraocular muscles intact.  HEENT: Head atraumatic, normocephalic. Oropharynx and nasopharynx clear.  NECK:  Supple, no jugular venous distention. No thyroid enlargement, no tenderness.  LUNGS: Normal  breath sounds bilaterally, no wheezing, rales, rhonchi. No use of accessory muscles of respiration.  CARDIOVASCULAR: S1, S2 normal. No murmurs, rubs, or gallops.  ABDOMEN: Soft, nontender, nondistended. Bowel sounds present. No organomegaly or mass.  EXTREMITIES: No cyanosis, clubbing or edema b/l.    NEUROLOGIC: Cranial nerves II through XII are intact. No focal Motor or sensory deficits b/l.   PSYCHIATRIC: The patient is alert and oriented x 3.  SKIN: No obvious rash, lesion, or ulcer.   LABORATORY PANEL:   CBC Recent Labs  Lab 09/17/18 0414  WBC 6.9  HGB 9.5*  HCT 26.7*  PLT 232   ------------------------------------------------------------------------------------------------------------------ Chemistries  Recent Labs  Lab 09/19/18 0532  NA 138  K 4.1  CL 108  CO2 27  GLUCOSE 95  BUN 16  CREATININE 1.29*  CALCIUM 8.8*   ------------------------------------------------------------------------------------------------------------------  Cardiac Enzymes Recent Labs  Lab 09/16/18 1203  TROPONINI <0.03   ------------------------------------------------------------------------------------------------------------------  RADIOLOGY:  No results found.   ASSESSMENT AND PLAN:  85 year old male patient with history of prostate cancer, coronary disease, hyperlipidemia, hypertension, paroxysmal atrial fibrillation currently under hospitalist service for orthostatic hypotension  *Acute orthostatic hypotension with associated dizziness/lightheadedness/near syncope  Remains persistent despite increasing Midodrine Cardiology input appreciated  Start Florinef, check orthostatics every a.m., fall precautions Continue to hold antihypertensive agents  *Chronic nausea  Stable  Outpatient GI follow-up Status post cardiology evaluation  *Coronary artery disease Stable Antihypertensives held given orthostatic hypotension with associated symptomatology   *Chronic paroxysmal  atrial fibrillation Stable In sinus rhythm Continue Xarelto and amiodarone Cardizem and metoprolol on hold as stated above  *CKD stage III Stable Avoid nephrotoxic agents Monitor renal function  All the  records are reviewed and case discussed with Care Management/Social Worker. Management plans discussed with the patient, family and they are in agreement.  CODE STATUS: Full code  DVT Prophylaxis: SCDs  TOTAL TIME TAKING CARE OF THIS PATIENT: 35 minutes.   POSSIBLE D/C IN 1 to 2 DAYS, DEPENDING ON CLINICAL CONDITION.  Avel Peace Jaxzen Vanhorn M.D on 09/19/2018 at 11:19 AM  Between 7am to 6pm - Pager - [phone]  After 6pm go to [email or web address] - password [email or web address] [facility]  Office  [phone]  CC: Primary care physician; [name], MD  Note: This dictation was prepared with Dragon dictation along with smaller phrase technology. Any transcriptional errors that result from this process are unintentional.

## 2018-09-19 NOTE — Progress Notes (Signed)
Family Meeting Note  Advance Directive:yes  Today a meeting took place with the Patient.  Patient is able to participate  The following clinical team members were present during this meeting:MD  The following were discussed:Patient's diagnosis: Orthostatic hypotension, Patient's progosis: Unable to determine and Goals for treatment: DNR  Additional follow-up to be provided: prn  Time spent during discussion:20 minutes  Gorden Harms, MD

## 2018-09-19 NOTE — Progress Notes (Signed)
Progress Note  Patient Name: William Taylor Date of Encounter: 09/19/2018  Primary Cardiologist:   No primary care provider on file.   Subjective   Still with profound drops in BP with standing and dizziness.    Inpatient Medications    Scheduled Meds: . amiodarone  200 mg Oral Daily  . feeding supplement (ENSURE ENLIVE)  237 mL Oral BID BM  . fenofibrate  160 mg Oral Daily  . finasteride  5 mg Oral Daily  . levobunolol  1 drop Right Eye BID  . levothyroxine  50 mcg Oral Q0600  . midodrine  10 mg Oral TID WC  . multivitamin with minerals  1 tablet Oral Daily  . omega-3 acid ethyl esters  1 g Oral Daily  . ranolazine  500 mg Oral BID  . rivaroxaban  20 mg Oral Daily  . sodium chloride flush  3 mL Intravenous Once   Continuous Infusions: . sodium chloride    . sodium chloride 75 mL/hr at 09/19/18 0653   PRN Meds: cyproheptadine, docusate sodium, nitroGLYCERIN, ondansetron, polyethylene glycol   Vital Signs    Vitals:   09/18/18 2002 09/19/18 0413 09/19/18 0556 09/19/18 0739  BP: (!) 167/88 (!) 169/93 134/77 (!) 164/94  Pulse: 69 67 65 65  Resp: 20 18  18   Temp: 98.5 F (36.9 C) (!) 97.4 F (36.3 C)  98.4 F (36.9 C)  TempSrc: Oral Oral  Oral  SpO2: 98% 99% 99% 97%  Weight:  93.1 kg    Height:        Intake/Output Summary (Last 24 hours) at 09/19/2018 0923 Last data filed at 09/19/2018 9470 Gross per 24 hour  Intake 720 ml  Output 0 ml  Net 720 ml   Filed Weights   09/17/18 0349 09/18/18 0341 09/19/18 0413  Weight: 93.2 kg 92.9 kg 93.1 kg    Telemetry    NSR - Personally Reviewed  ECG    NA - Personally Reviewed  Physical Exam   GEN: No acute distress.   Neck: No  JVD Cardiac: RRR,  no murmurs, rubs, or gallops.  Respiratory: Clear  to auscultation bilaterally. GI: Soft, nontender, non-distended  MS: No  edema; No deformity. Neuro:  Nonfocal  Psych: Normal affect   Labs    Chemistry Recent Labs  Lab 09/17/18 0414 09/18/18 0619  09/19/18 0532  NA 137 138 138  K 3.7 3.7 4.1  CL 107 108 108  CO2 24 24 27   GLUCOSE 100* 98 95  BUN 19 19 16   CREATININE 9.62 1.22 1.29*  CALCIUM 8.8* 8.9 8.8*  GFRNONAA 53* 53* 50*  GFRAA >60 >60 58*  ANIONGAP 6 6 3*     Hematology Recent Labs  Lab 09/16/18 1203 09/17/18 0414  WBC 7.6 6.9  RBC 2.99* 2.53*  HGB 10.8* 9.5*  HCT 30.7* 26.7*  MCV 102.7* 105.5*  MCH 36.1* 37.5*  MCHC 35.2 35.6  RDW 13.2 13.2  PLT 288 232    Cardiac Enzymes Recent Labs  Lab 09/16/18 1203  TROPONINI <0.03   No results for input(s): TROPIPOC in the last 168 hours.   BNPNo results for input(s): BNP, PROBNP in the last 168 hours.   DDimer No results for input(s): DDIMER in the last 168 hours.   Radiology    No results found.  Cardiac Studies   NA  Patient Profile     85 y.o. male with a hx of CAD s/p LAD stenting 2005 PCI-OM2 2012, PAF (Xarelto), PE  2006 in [facility], PE following catheterization 2012, atypical chest pain, CKD III, cold agglutinin hemolytic anemia, prostate CA, orthostatic hypotension, HTN, HLDwho is being seen for the evaluation ofpre-syncope sx.   Assessment & Plan    ORTHOSTATIC HYPOTENSION:   On midodrine.  Florinef started.    I will order an abdominal binder.   PAF:   On amiodarone and Xarelto.  Beta blocker and Cardizem held.     CKD :  Conservative medical management given his hypotension.     For questions or updates, please contact [place] Please consult [email or web address] for contact info under Cardiology/STEMI.   Signed, Minus Breeding, MD  09/19/2018, 9:23 AM

## 2018-09-20 LAB — CBC
HCT: 34.2 % — ABNORMAL LOW (ref 39.0–52.0)
HEMOGLOBIN: 11.8 g/dL — AB (ref 13.0–17.0)
MCH: 35.2 pg — ABNORMAL HIGH (ref 26.0–34.0)
MCHC: 34.5 g/dL (ref 30.0–36.0)
MCV: 102.1 fL — ABNORMAL HIGH (ref 80.0–100.0)
NRBC: 0 % (ref 0.0–0.2)
Platelets: 215 10*3/uL (ref 150–400)
RBC: 3.35 MIL/uL — AB (ref 4.22–5.81)
RDW: 13.7 % (ref 11.5–15.5)
WBC: 5.4 10*3/uL (ref 4.0–10.5)

## 2018-09-20 LAB — HEMOGLOBIN AND HEMATOCRIT, BLOOD
HCT: 33.7 % — ABNORMAL LOW (ref 39.0–52.0)
HCT: 31.6 % — ABNORMAL LOW (ref 39.0–52.0)
Hemoglobin: 11.3 g/dL — ABNORMAL LOW (ref 13.0–17.0)
Hemoglobin: 10.3 g/dL — ABNORMAL LOW (ref 13.0–17.0)

## 2018-09-20 LAB — CREATININE, SERUM
Creatinine, Ser: 1.08 mg/dL (ref 0.61–1.24)
GFR calc non Af Amer: >60 mL/min (ref >60)

## 2018-09-20 MED ORDER — SODIUM CHLORIDE 0.9 % IV SOLN
8.0000 mg/h | INTRAVENOUS | Status: DC
  Administered 2018-09-20 – 2018-09-22 (×5): 8 mg/h via INTRAVENOUS
  Filled 2018-09-20 (×7): qty 80

## 2018-09-20 MED ORDER — FLUDROCORTISONE ACETATE 0.1 MG PO TABS
0.1000 mg | ORAL_TABLET | Freq: Every day | ORAL | Status: DC
  Administered 2018-09-20 – 2018-09-21 (×2): 0.1 mg via ORAL
  Filled 2018-09-20 (×4): qty 1

## 2018-09-20 MED ORDER — SODIUM CHLORIDE 0.9 % IV SOLN
80.0000 mg | Freq: Once | INTRAVENOUS | Status: AC
  Administered 2018-09-20: 80 mg via INTRAVENOUS
  Filled 2018-09-20: qty 80

## 2018-09-20 MED ORDER — PANTOPRAZOLE SODIUM 40 MG IV SOLR: 40.0000 mg | Freq: Two times a day (BID) | INTRAVENOUS | Status: DC

## 2018-09-20 NOTE — Consult Note (Signed)
[name] , MD [address] [place], [place], [place], [address] [address], [place], [place], [place], [address] Phone: [phone]  Fax: [phone]  Consultation  Referring Provider:    Dr Jerelyn Charles  Primary Care Physician:  [name], MD Primary Gastroenterologist:  Dr. [name]         Reason for Consultation:     GI bleed   Date of Admission:  09/16/2018 Date of Consultation:  09/20/2018         HPI:   William Taylor is a 85 y.o. male is known to me as an outpatient.  Summary of history :  He is a gentleman that I follow as an outpatient for iron deficiency anemia, abdominal discomfort.  He was last seen by me on 09/08/2018 for abdominal discomfort he did get symptoms at that time suggestive of chest pain almost sounded like angina.  He had recently been diagnosed with hypothyroidism and not yet started medications.  He also carries a diagnosis of cold agglutinin hemolytic anemia.  At my last office visit he did mention symptoms suggestive of postural hypotension.  Past medical history also includes a history of CAD,stent placment in 2005 , Atrial fibrillation 2011, On anticoagulation . He wason xarelto. He has external hemorroids. 06/2016 Ferritin 9 , TIBC elevated. 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 10/03/16- capsule study showedno bleeding but a few small polyps were seen . 09/2017 : iron studies were normal .11/2017, b12 and folate normal.   He was admitted on 09/16/2018 15 as hypotension.  He was found to have postural hypotension in the ER.  Subsequently was admitted.  Neurology is following the patient.  I have been asked to see the patient for a GI bleed.  He says he has had 2 tarry black bowel movements yesterday, no abdominal pain and no nsaid use.  Denies any hematemesis.    CBC Latest Ref Rng & Units 09/20/2018 09/17/2018 09/16/2018  WBC 4.0 - 10.5 K/uL 5.4 6.9 7.6  Hemoglobin 13.0 - 17.0 g/dL 11.8(L) 9.5(L) 10.8(L)  Hematocrit 39.0 - 52.0 % 34.2(L) 26.7(L) 30.7(L)  Platelets 150 - 400 K/uL 215 232 288     Past Medical History:  Diagnosis Date  . Cancer [facility])    Prostate, followed by Dr. Jacqlyn Larsen  . Colon polyp   . Coronary artery disease   . Hyperlipidemia   . Hypertension   . MI (myocardial infarction) ([place])    2015  . PAF (paroxysmal atrial fibrillation) ([place])    a. on xarelto  . Skin cancer     Past Surgical History:  Procedure Laterality Date  . CARDIAC CATHETERIZATION  10/2013   [facility]  . CATARACT EXTRACTION  Oct. 3, 2012   right eye  . colonoscopy    . COLONOSCOPY W/ POLYPECTOMY  2015   Dr Rayann Heman  . COLONOSCOPY WITH PROPOFOL N/A 08/15/2016   Procedure: COLONOSCOPY WITH PROPOFOL;  Surgeon: [name], MD;  Location: [facility] ENDOSCOPY;  Service: Endoscopy;  Laterality: N/A;  . CORONARY ANGIOPLASTY  2009   2005; s/p stent  . ESOPHAGOGASTRODUODENOSCOPY (EGD) WITH PROPOFOL N/A 08/15/2016   Procedure: ESOPHAGOGASTRODUODENOSCOPY (EGD) WITH PROPOFOL;  Surgeon: [name], MD;  Location: [facility] ENDOSCOPY;  Service: Endoscopy;  Laterality: N/A;  . GIVENS CAPSULE STUDY N/A 09/26/2016   Procedure: GIVENS CAPSULE STUDY;  Surgeon: Bailey Mech  [name], MD;  Location: [facility] ENDOSCOPY;  Service: Endoscopy;  Laterality: N/A;  . HERNIA REPAIR  2013  . LUMBAR SPINE SURGERY    . UPPER GI ENDOSCOPY  Sept 2015   Dr Rayann Heman    Prior to Admission medications   Medication Sig Start Date End Date Taking? Authorizing Provider  amiodarone (PACERONE) 200 MG tablet TAKE 1 TABLET BY MOUTH  DAILY AS NEEDED 04/14/18  Yes Gollan, Kathlene November, MD  diltiazem (CARDIZEM CD) 120 MG 24 hr capsule TAKE 1 CAPSULE BY MOUTH  DAILY 04/15/18  Yes Gollan, Kathlene November, MD  fenofibrate micronized (ANTARA) 130 MG capsule TAKE 1 CAPSULE BY MOUTH  DAILY BEFORE BREAKFAST 08/24/18  Yes  [name], MD  finasteride (PROSCAR) 5 MG tablet Take 1 tablet (5 mg total) by mouth daily. 02/05/18  Yes Stoioff, Ronda Fairly, MD  levobunolol (BETAGAN) 0.5 % ophthalmic solution Place 1 drop into the right eye 2 (two) times daily.    Yes [provider]  Levothyroxine Sodium 25 MCG CAPS Take 1 capsule (25 mcg total) by mouth daily before breakfast. 08/28/18  Yes Guse, Jacquelynn Cree, FNP  metoprolol tartrate (LOPRESSOR) 25 MG tablet Take 0.5 tablets (12.5 mg total) by mouth 2 (two) times daily. 08/17/18  Yes Dunn, Areta Haber, PA-C  nitroGLYCERIN (NITROSTAT) 0.4 MG SL tablet DISSOLVE UNDER THE TONGUE 1 TABLET EVERY 5 MINUTES AS NEEDED FOR CHEST PAIN 06/11/16  Yes Gollan, Kathlene November, MD  Omega-3 Fatty Acids (FISH OIL) 1000 MG CAPS Take 1,000 mg by mouth daily.   Yes [provider]  ondansetron (ZOFRAN-ODT) 8 MG disintegrating tablet Take 0.5 tablets (4 mg total) by mouth every 8 (eight) hours as needed for nausea. 09/16/18  Yes Guse, Jacquelynn Cree, FNP  polyethylene glycol powder (GLYCOLAX/MIRALAX) powder Take 17 g by mouth 2 (two) times daily as needed. 06/05/18  Yes [name], MD  pravastatin (PRAVACHOL) 40 MG tablet TAKE 1 TABLET BY MOUTH AT  BEDTIME 06/08/18  Yes [name], MD  ranolazine (RANEXA) 500 MG 12 hr tablet Take 1 tablet (500 mg total) by mouth 2 (two) times daily. 08/02/18  Yes Dunn, Areta Haber, PA-C  tamsulosin (FLOMAX) 0.4 MG CAPS capsule Take 1 capsule (0.4 mg total) by mouth daily. 06/15/18  Yes Stoioff, Ronda Fairly, MD  XARELTO 20 MG TABS tablet TAKE 1 TABLET DAILY 06/12/18  Yes Gollan, Kathlene November, MD  cyproheptadine (PERIACTIN) 4 MG tablet Take 1 tablet (4 mg total) by mouth 3 (three) times daily as needed for allergies. 02/17/18   [name], MD    Family History  Problem Relation Age of Onset  . Stroke Mother   . Colon cancer Father   . Coronary artery disease Brother   . Other Brother 94       lymes dz     Social History   Tobacco Use  . Smoking  status: Never Smoker  . Smokeless tobacco: Never Used  Substance Use Topics  . Alcohol use: No  . Drug use: No    Allergies as of 09/16/2018 - Review Complete 09/16/2018  Allergen Reaction Noted  . Bee venom Swelling 07/17/2015  . Sulfasalazine Hives and Swelling 01/22/2016  . Sulfa antibiotics Hives and Swelling 01/22/2016  . Warfarin and related Other (See Comments) 05/18/2012    Review of Systems:    All systems reviewed and negative except where noted in HPI.   Physical Exam:  Vital signs in last 24 hours: Temp:  [97.5 F (36.4  C)-98.5 F (36.9 C)] 97.5 F (36.4 C) (02/02 0806) Pulse Rate:  [64-90] 90 (02/02 0809) Resp:  [17-20] 19 (02/02 0806) BP: (92-173)/(70-111) 92/70 (02/02 0809) SpO2:  [94 %-98 %] 98 % (02/02 0806) Last BM Date: 09/18/18 General:   Pleasant, cooperative in NAD Head:  Normocephalic and atraumatic. Eyes:   No icterus.   Conjunctiva pink. PERRLA. Ears:  Normal auditory acuity. Neck:  Supple; no masses or thyroidomegaly Lungs: Respirations even and unlabored. Lungs clear to auscultation bilaterally.   No wheezes, crackles, or rhonchi.  Heart:  Regular rate and rhythm;  Without murmur, clicks, rubs or gallops Abdomen:  Soft, nondistended, nontender. Normal bowel sounds. No appreciable masses or hepatomegaly.  No rebound or guarding.  Neurologic:  Alert and oriented x3;  grossly normal neurologically. Skin:  Intact without significant lesions or rashes. Cervical Nodes:  No significant cervical adenopathy. Psych:  Alert and cooperative. Normal affect.  LAB RESULTS: Recent Labs    09/20/18 0410  WBC 5.4  HGB 11.8*  HCT 34.2*  PLT 215   BMET Recent Labs    09/18/18 0619 09/19/18 0532 09/20/18 0410  NA 138 138  --   K 3.7 4.1  --   CL 108 108  --   CO2 24 27  --   GLUCOSE 98 95  --   BUN 19 16  --   CREATININE 1.22 1.29* 1.08  CALCIUM 8.9 8.8*  --    LFT No results for input(s): PROT, ALBUMIN, AST, ALT, ALKPHOS, BILITOT, BILIDIR,  IBILI in the last 72 hours. PT/INR No results for input(s): LABPROT, INR in the last 72 hours.  STUDIES: No results found.    Impression / Plan:   ITZEL LOWRIMORE is a 85 y.o. y/o male with 3 of CAD and iron deficiency anemia.  Prior evaluation has not shown a clear source of iron deficiency he is on Xarelto and follows with cardiology.  Seen at my office on 09/08/2018 and at that point of time he was referred for a stool occult blood test that was positive and abdominal discomfort.  When I spoke to him at that point of time his predominant symptoms were in fact dizziness on standing up, he also had some left-sided chest pain which sounded in my opinion to be  angina.  I instructed my office to get in touch with Dr. Rockey Situ to see if they can address his cardiological issues prior to GI work up.  In the meanwhile he has been admitted with the same  issues.  He is on Xarelto and today I been consulted for a GI bleed due to black tarry stools yesterday .  Globin stable at 11.8 g.   1.  Monitor CBC and transfuse as needed. 2.  There is no need to check stool occult blood test.  A positive or negative test does not rule in or rule out a GI bleed .  This test is only indicated for colon cancer screening. 3.  With a history of melena and on Xarelto and prior iron deficiency an push enteroscopy  could be considered once he is cleared from the cardiology standpoint to undergo anesthesia . 4.  Continue PPI, hold xarelto if possible.   Thank you for involving me in the care of this patient.      LOS: 1 day   [name], MD  09/20/2018, 11:30 AM

## 2018-09-20 NOTE — Progress Notes (Addendum)
[facility] [place] NAME: William Taylor    MR#:  [id number]  DATE OF BIRTH:  08/27/1929  SUBJECTIVE:  Continues to complain of dizziness/lightheadedness with standing/ambulation, wife at the bedside-complains now of black tarry stools similar to episode of diverticular bleeding-Per wife/patient has been ongoing for several days, hold Xarelto, start Protonix drip, consult gastroenterology for expert opinion  REVIEW OF SYSTEMS:    ROS  CONSTITUTIONAL: No documented fever. No fatigue, weakness. No weight gain, no weight loss.  EYES: No blurry or double vision.  ENT: No tinnitus. No postnasal drip. No redness of the oropharynx.  RESPIRATORY: No cough, no wheeze, no hemoptysis. No dyspnea.  CARDIOVASCULAR: No chest pain. No orthopnea. No palpitations. No syncope.  GASTROINTESTINAL: No nausea, no vomiting or diarrhea. No abdominal pain. No melena or hematochezia.  GENITOURINARY: No dysuria or hematuria.  ENDOCRINE: No polyuria or nocturia. No heat or cold intolerance.  HEMATOLOGY: No anemia. No bruising. No bleeding.  INTEGUMENTARY: No rashes. No lesions.  MUSCULOSKELETAL: No arthritis. No swelling. No gout.  NEUROLOGIC: No numbness, tingling, or ataxia. No seizure-type activity.  Has dizziness PSYCHIATRIC: No anxiety. No insomnia. No ADD.   DRUG ALLERGIES:   Allergies  Allergen Reactions  . Bee Venom Swelling  . Sulfasalazine Hives and Swelling  . Sulfa Antibiotics Hives and Swelling  . Warfarin And Related Other (See Comments)    Chest pain    VITALS:  Blood pressure 92/70, pulse 90, temperature (!) 97.5 F (36.4 C), temperature source Oral, resp. rate 19, height 6\' 4"  (1.93 m), weight 93.1 kg, SpO2 98 %.  PHYSICAL EXAMINATION:   Physical Exam  GENERAL:  85 y.o.-year-old patient lying in the bed with no acute distress.  EYES: Pupils equal, round, reactive to light and accommodation. No scleral icterus. Extraocular muscles intact.   HEENT: Head atraumatic, normocephalic. Oropharynx and nasopharynx clear.  NECK:  Supple, no jugular venous distention. No thyroid enlargement, no tenderness.  LUNGS: Normal breath sounds bilaterally, no wheezing, rales, rhonchi. No use of accessory muscles of respiration.  CARDIOVASCULAR: S1, S2 normal. No murmurs, rubs, or gallops.  ABDOMEN: Soft, nontender, nondistended. Bowel sounds present. No organomegaly or mass.  EXTREMITIES: No cyanosis, clubbing or edema b/l.    NEUROLOGIC: Cranial nerves II through XII are intact. No focal Motor or sensory deficits b/l.   PSYCHIATRIC: The patient is alert and oriented x 3.  SKIN: No obvious rash, lesion, or ulcer.   LABORATORY PANEL:   CBC Recent Labs  Lab 09/20/18 0410  WBC 5.4  HGB 11.8*  HCT 34.2*  PLT 215   ------------------------------------------------------------------------------------------------------------------ Chemistries  Recent Labs  Lab 09/19/18 0532 09/20/18 0410  NA 138  --   K 4.1  --   CL 108  --   CO2 27  --   GLUCOSE 95  --   BUN 16  --   CREATININE 1.29* 1.08  CALCIUM 8.8*  --    ------------------------------------------------------------------------------------------------------------------  Cardiac Enzymes Recent Labs  Lab 09/16/18 1203  TROPONINI <0.03   ------------------------------------------------------------------------------------------------------------------  RADIOLOGY:  No results found.   ASSESSMENT AND PLAN:  85 year old male patient with history of prostate cancer, coronary disease, hyperlipidemia, hypertension, paroxysmal atrial fibrillation currently under hospitalist service for orthostatic hypotension  *Acute orthostatic hypotension with associated dizziness/lightheadedness/near syncope  Improved Cardiology input appreciated  Continue Florinef, Midodrine, check orthostatics every a.m., fall precautions Continue to hold antihypertensive agents  *Acute recurrent  melena Per patient/wife-noted black tarry stools similar to previous episodes  of diverticular bleeding for the last several days Hemoglobin actually improved to 11.8 Hold Xarelto, check H&H every 8 hours, CBC daily, transfuse as needed, gastroenterology to see  *Chronic nausea  Resolved Gastroenterology to see Status post cardiology evaluation  *Coronary artery disease Stable Antihypertensives held given orthostatic hypotension with associated symptomatology  *Chronic paroxysmal atrial fibrillation Stable In sinus rhythm Continue amiodarone Xarelto currently on hold due to patient/wife complaint of lower GI bleeding for several days despite improved hemoglobin Cardizem and metoprolol on hold as stated above  *CKD stage III Stable Avoid nephrotoxic agents Monitor renal function  All the records are reviewed and case discussed with Care Management/Social Worker. Management plans discussed with the patient, family and they are in agreement.  CODE STATUS: Full code  DVT Prophylaxis: SCDs  TOTAL TIME TAKING CARE OF THIS PATIENT: 35 minutes.   POSSIBLE D/C IN 1 to 2 DAYS, DEPENDING ON CLINICAL CONDITION.  Avel Peace Salary M.D on 09/20/2018 at 11:12 AM  Between 7am to 6pm - Pager - [phone]  After 6pm go to [email or web address] - password [email or web address] [facility]  Office  [phone]  CC: Primary care physician; [name], MD  Note: This dictation was prepared with Dragon dictation along with smaller phrase technology. Any transcriptional errors that result from this process are unintentional.

## 2018-09-20 NOTE — Progress Notes (Signed)
Progress Note  Patient Name: William Taylor Date of Encounter: 09/20/2018  Primary Cardiologist:   No primary care provider on file.   Subjective   BP dropped from systolic 867Y supine to 19J standing.  Nauseated after the Florinef yesterday.  Mentioned black tarry stools to the primary team and has been seen by GI.    Inpatient Medications    Scheduled Meds: . amiodarone  200 mg Oral Daily  . feeding supplement (ENSURE ENLIVE)  237 mL Oral BID BM  . fenofibrate  160 mg Oral Daily  . finasteride  5 mg Oral Daily  . fludrocortisone  0.1 mg Oral Daily  . levobunolol  1 drop Right Eye BID  . levothyroxine  50 mcg Oral Q0600  . midodrine  10 mg Oral TID WC  . multivitamin with minerals  1 tablet Oral Daily  . omega-3 acid ethyl esters  1 g Oral Daily  . ranolazine  500 mg Oral BID  . sodium chloride flush  3 mL Intravenous Once   Continuous Infusions: . sodium chloride    . sodium chloride 30 mL/hr at 09/20/18 1126  . pantoprozole (PROTONIX) infusion 8 mg/hr (09/20/18 1213)   PRN Meds: cyproheptadine, docusate sodium, nitroGLYCERIN, ondansetron, polyethylene glycol   Vital Signs    Vitals:   09/20/18 0444 09/20/18 0806 09/20/18 0808 09/20/18 0809  BP: 136/73 (!) 172/111 (!) 148/102 92/70  Pulse: 64 76 84 90  Resp: 17 19    Temp: 98.3 F (36.8 C) (!) 97.5 F (36.4 C)    TempSrc: Oral Oral    SpO2: 94% 98%    Weight:      Height:        Intake/Output Summary (Last 24 hours) at 09/20/2018 1305 Last data filed at 09/20/2018 1151 Gross per 24 hour  Intake 3561.16 ml  Output 750 ml  Net 2811.16 ml   Filed Weights   09/17/18 0349 09/18/18 0341 09/19/18 0413  Weight: 93.2 kg 92.9 kg 93.1 kg    Telemetry    NSR - Personally Reviewed  ECG    NA - Personally Reviewed  Physical Exam   GEN: No acute distress.   Neck: No  JVD Cardiac:   Irregular RR,  no murmurs, rubs, or gallops.  Respiratory: Clear  to auscultation bilaterally. GI: Soft, nontender,  non-distended  MS: No  edema; No deformity. Neuro:  Nonfocal  Psych: Normal affect   Labs    Chemistry Recent Labs  Lab 09/17/18 0414 09/18/18 0619 09/19/18 0532 09/20/18 0410  NA 137 138 138  --   K 3.7 3.7 4.1  --   CL 107 108 108  --   CO2 24 24 27   --   GLUCOSE 100* 98 95  --   BUN 19 19 16   --   CREATININE 1.23 1.22 1.29* 1.08  CALCIUM 8.8* 8.9 8.8*  --   GFRNONAA 53* 53* 50* >60  GFRAA >60 >60 58* >60  ANIONGAP 6 6 3*  --      Hematology Recent Labs  Lab 09/16/18 1203 09/17/18 0414 09/20/18 0410  WBC 7.6 6.9 5.4  RBC 2.99* 2.53* 3.35*  HGB 10.8* 9.5* 11.8*  HCT 30.7* 26.7* 34.2*  MCV 102.7* 105.5* 102.1*  MCH 36.1* 37.5* 35.2*  MCHC 35.2 35.6 34.5  RDW 13.2 13.2 13.7  PLT 288 232 215    Cardiac Enzymes Recent Labs  Lab 09/16/18 1203  TROPONINI <0.03   No results for input(s): TROPIPOC in the last  168 hours.   BNPNo results for input(s): BNP, PROBNP in the last 168 hours.   DDimer No results for input(s): DDIMER in the last 168 hours.   Radiology    No results found.  Cardiac Studies   NA  Patient Profile     85 y.o. male with a hx of CAD s/p LAD stenting 2005 PCI-OM2 2012, PAF (Xarelto), PE 2006 in [place], PE following catheterization 2012, atypical chest pain, CKD III, cold agglutinin hemolytic anemia, prostate CA, orthostatic hypotension, HTN, HLDwho is being seen for the evaluation ofpre-syncope sx.   Assessment & Plan    ORTHOSTATIC HYPOTENSION:   On midodrine.  Florinef started yesterday.  Started to use an abdominal binder.  No change in therapy.      PAF:   On amiodarone.  Xarelto held because of black tarry stool.  Beta blocker and Cardizem held.   He is no longer on telemetry.    CKD :  Conservative medical management given his hypotension.  Creat is stable.     QUESTIONABLE GI BLEED:  [place] [place] [facility] for GI work up as indicated.  OK for endoscopy if indicated.     For questions or updates, please contact [place] Please consult  [email or web address] for contact info under Cardiology/STEMI.   Signed, Minus Breeding, MD  09/20/2018, 1:05 PM

## 2018-09-20 NOTE — Plan of Care (Signed)
Patient remains positive for orthostatics. Patient does not want to wear abdominal binder to the restroom. Explained the importance of the binder and position changes.

## 2018-09-21 ENCOUNTER — Ambulatory Visit: Payer: Medicare HMO | Admitting: Cardiovascular Disease

## 2018-09-21 ENCOUNTER — Telehealth: Payer: Self-pay | Admitting: *Deleted

## 2018-09-21 DIAGNOSIS — E440 Moderate protein-calorie malnutrition: Secondary | ICD-10-CM

## 2018-09-21 DIAGNOSIS — K921 Melena: Secondary | ICD-10-CM

## 2018-09-21 LAB — HEMOGLOBIN AND HEMATOCRIT, BLOOD
HCT: 30.3 % — ABNORMAL LOW (ref 39.0–52.0)
HCT: 40.1 % (ref 39.0–52.0)
Hemoglobin: 10.1 g/dL — ABNORMAL LOW (ref 13.0–17.0)
Hemoglobin: 13.3 g/dL (ref 13.0–17.0)

## 2018-09-21 LAB — CBC
HEMATOCRIT: 35.3 % — AB (ref 39.0–52.0)
Hemoglobin: 11.9 g/dL — ABNORMAL LOW (ref 13.0–17.0)
MCH: 34.9 pg — ABNORMAL HIGH (ref 26.0–34.0)
MCHC: 33.7 g/dL (ref 30.0–36.0)
MCV: 103.5 fL — ABNORMAL HIGH (ref 80.0–100.0)
Platelets: 243 10*3/uL (ref 150–400)
RBC: 3.41 MIL/uL — ABNORMAL LOW (ref 4.22–5.81)
RDW: 14 % (ref 11.5–15.5)
WBC: 8 10*3/uL (ref 4.0–10.5)
nRBC: 0 % (ref 0.0–0.2)

## 2018-09-21 LAB — BASIC METABOLIC PANEL
Anion gap: 4 — ABNORMAL LOW (ref 5–15)
BUN: 18 mg/dL (ref 8–23)
CO2: 27 mmol/L (ref 22–32)
Calcium: 8.8 mg/dL — ABNORMAL LOW (ref 8.9–10.3)
Chloride: 106 mmol/L (ref 98–111)
Creatinine, Ser: 1.06 mg/dL (ref 0.61–1.24)
GFR calc Af Amer: >60 mL/min (ref >60)
GFR calc non Af Amer: >60 mL/min (ref >60)
Glucose, Bld: 91 mg/dL (ref 70–99)
POTASSIUM: 4.3 mmol/L (ref 3.5–5.1)
Sodium: 137 mmol/L (ref 135–145)

## 2018-09-21 MED ORDER — ENSURE ENLIVE PO LIQD: 237.0000 mL | ORAL | Status: DC

## 2018-09-21 MED ORDER — TAMSULOSIN HCL 0.4 MG PO CAPS
0.4000 mg | ORAL_CAPSULE | ORAL | Status: DC
  Administered 2018-09-21 – 2018-09-23 (×2): 0.4 mg via ORAL
  Filled 2018-09-21 (×2): qty 1

## 2018-09-21 NOTE — Progress Notes (Signed)
William Darby, MD [address]  [place]  [place], [place] [address]  Main: [phone]  Fax: [phone] Pager: [phone]   Subjective: Patient is seen by cardiology, remains orthostatic, he denies further episodes of melena, in fact reports that his nurse told him that his bowel movements are brown.  Hemoglobin is stable   Objective: Vital signs in last 24 hours: Vitals:   09/21/18 0353 09/21/18 0745 09/21/18 0824 09/21/18 1537  BP: (!) 135/97 (!) 171/95 (!) 113/51 (!) 182/84  Pulse: 76 70 77 (!) 56  Resp: 18 17 20 18   Temp: 98.2 F (36.8 C) 97.7 F (36.5 C) 97.6 F (36.4 C) 98.2 F (36.8 C)  TempSrc: Oral Oral Oral Oral  SpO2: 96% 98% 96% 98%  Weight: 92.7 kg     Height:       Weight change:   Intake/Output Summary (Last 24 hours) at 09/21/2018 1733 Last data filed at 09/21/2018 1345 Gross per 24 hour  Intake 1330.9 ml  Output 450 ml  Net 880.9 ml     Exam: Heart:: S1S2 present or without murmur or extra heart sounds Lungs: normal and clear to auscultation Abdomen: soft, nontender, normal bowel sounds   Lab Results: CBC Latest Ref Rng & Units 09/21/2018 09/21/2018 09/21/2018  WBC 4.0 - 10.5 K/uL - 8.0 -  Hemoglobin 13.0 - 17.0 g/dL 13.3 11.9(L) 10.1(L)  Hematocrit 39.0 - 52.0 % 40.1 35.3(L) 30.3(L)  Platelets 150 - 400 K/uL - 243 -   Micro Results: No results found for this or any previous visit (from the past 240 hour(s)). Studies/Results: No results found. Medications:  I have reviewed the patient's current medications. Prior to Admission:  Medications Prior to Admission  Medication Sig Dispense Refill Last Dose  . amiodarone (PACERONE) 200 MG tablet TAKE 1 TABLET BY MOUTH  DAILY AS NEEDED 90 tablet 2 09/15/2018 at 0900  . diltiazem (CARDIZEM CD) 120 MG 24 hr capsule TAKE 1 CAPSULE BY MOUTH  DAILY 90 capsule 2 09/16/2018 at 0900  . fenofibrate micronized (ANTARA) 130 MG capsule TAKE 1 CAPSULE BY MOUTH  DAILY BEFORE BREAKFAST 90 capsule 1  09/15/2018 at 0900  . finasteride (PROSCAR) 5 MG tablet Take 1 tablet (5 mg total) by mouth daily. 90 tablet 2 09/15/2018 at 0900  . levobunolol (BETAGAN) 0.5 % ophthalmic solution Place 1 drop into the right eye 2 (two) times daily.    09/15/2018 at 2100  . Levothyroxine Sodium 25 MCG CAPS Take 1 capsule (25 mcg total) by mouth daily before breakfast. 30 capsule 2 09/15/2018 at 0900  . metoprolol tartrate (LOPRESSOR) 25 MG tablet Take 0.5 tablets (12.5 mg total) by mouth 2 (two) times daily. 90 tablet 1 09/16/2018 at 0900  . nitroGLYCERIN (NITROSTAT) 0.4 MG SL tablet DISSOLVE UNDER THE TONGUE 1 TABLET EVERY 5 MINUTES AS NEEDED FOR CHEST PAIN 25 tablet 3 prn at prn  . Omega-3 Fatty Acids (FISH OIL) 1000 MG CAPS Take 1,000 mg by mouth daily.   09/15/2018 at 0900  . ondansetron (ZOFRAN-ODT) 8 MG disintegrating tablet Take 0.5 tablets (4 mg total) by mouth every 8 (eight) hours as needed for nausea. 20 tablet 1 prn at prn  . polyethylene glycol powder (GLYCOLAX/MIRALAX) powder Take 17 g by mouth 2 (two) times daily as needed. 3350 g 1 prn at prn  . pravastatin (PRAVACHOL) 40 MG tablet TAKE 1 TABLET BY MOUTH AT  BEDTIME 90 tablet 3 09/15/2018 at 2100  . ranolazine (RANEXA) 500 MG 12  hr tablet Take 1 tablet (500 mg total) by mouth 2 (two) times daily. 60 tablet 3 09/15/2018 at 2100  . tamsulosin (FLOMAX) 0.4 MG CAPS capsule Take 1 capsule (0.4 mg total) by mouth daily. 30 capsule 11 09/15/2018 at 2100  . XARELTO 20 MG TABS tablet TAKE 1 TABLET DAILY 90 tablet 1 09/16/2018 at 0900  . cyproheptadine (PERIACTIN) 4 MG tablet Take 1 tablet (4 mg total) by mouth 3 (three) times daily as needed for allergies. 90 tablet 3 prn at prn   Scheduled: . amiodarone  200 mg Oral Daily  . feeding supplement (ENSURE ENLIVE)  237 mL Oral Q24H  . fenofibrate  160 mg Oral Daily  . finasteride  5 mg Oral Daily  . fludrocortisone  0.1 mg Oral Daily  . levobunolol  1 drop Right Eye BID  . levothyroxine  50 mcg Oral Q0600  .  midodrine  10 mg Oral TID WC  . multivitamin with minerals  1 tablet Oral Daily  . omega-3 acid ethyl esters  1 g Oral Daily  . ranolazine  500 mg Oral BID  . sodium chloride flush  3 mL Intravenous Once  . tamsulosin  0.4 mg Oral QODAY   Continuous: . sodium chloride    . sodium chloride 30 mL/hr at 09/21/18 1547  . pantoprozole (PROTONIX) infusion 8 mg/hr (09/21/18 1550)   YOV:ZCHYIFOYDXAJOI, docusate sodium, nitroGLYCERIN, ondansetron, polyethylene glycol Anti-infectives (From admission, onward)   None     Scheduled Meds: . amiodarone  200 mg Oral Daily  . feeding supplement (ENSURE ENLIVE)  237 mL Oral Q24H  . fenofibrate  160 mg Oral Daily  . finasteride  5 mg Oral Daily  . fludrocortisone  0.1 mg Oral Daily  . levobunolol  1 drop Right Eye BID  . levothyroxine  50 mcg Oral Q0600  . midodrine  10 mg Oral TID WC  . multivitamin with minerals  1 tablet Oral Daily  . omega-3 acid ethyl esters  1 g Oral Daily  . ranolazine  500 mg Oral BID  . sodium chloride flush  3 mL Intravenous Once  . tamsulosin  0.4 mg Oral QODAY   Continuous Infusions: . sodium chloride    . sodium chloride 30 mL/hr at 09/21/18 1547  . pantoprozole (PROTONIX) infusion 8 mg/hr (09/21/18 1550)   PRN Meds:.cyproheptadine, docusate sodium, nitroGLYCERIN, ondansetron, polyethylene glycol   Assessment: Principal Problem:   Orthostatic hypotension Active Problems:   Malnutrition of moderate degree  No further episodes of melena, anemia resolved  Plan: Plan for push enteroscopy tomorrow, patient is agreeable N.p.o. past midnight TSH is still elevated, will benefit from increasing the dose of levothyroxine Check free T4 levels   LOS: 2 days   Jeral Zick 09/21/2018, 5:33 PM

## 2018-09-21 NOTE — Plan of Care (Signed)
Denies any dizziness with ambulation. No bowel movement this shift.

## 2018-09-21 NOTE — Progress Notes (Addendum)
[facility] [facility] [place] NAME: Exodus Kutzer    MR#:  [id number]  DATE OF BIRTH:  [birth year]  SUBJECTIVE:  Continues to complain of dizziness/lightheadedness with standing/ambulation, wife at the bedside-continues to complain of black tarry stools similar to episode of diverticular bleeding-Per wife/patient has been ongoing for several days, gastroenterology input appreciated- ?  Endoscopy while in house, cardiology input appreciated  REVIEW OF SYSTEMS:    ROS  CONSTITUTIONAL: No documented fever. No fatigue, weakness. No weight gain, no weight loss.  EYES: No blurry or double vision.  ENT: No tinnitus. No postnasal drip. No redness of the oropharynx.  RESPIRATORY: No cough, no wheeze, no hemoptysis. No dyspnea.  CARDIOVASCULAR: No chest pain. No orthopnea. No palpitations. No syncope.  GASTROINTESTINAL: No nausea, no vomiting or diarrhea. No abdominal pain. No melena or hematochezia.  GENITOURINARY: No dysuria or hematuria.  ENDOCRINE: No polyuria or nocturia. No heat or cold intolerance.  HEMATOLOGY: No anemia. No bruising. No bleeding.  INTEGUMENTARY: No rashes. No lesions.  MUSCULOSKELETAL: No arthritis. No swelling. No gout.  NEUROLOGIC: No numbness, tingling, or ataxia. No seizure-type activity.  Has dizziness PSYCHIATRIC: No anxiety. No insomnia. No ADD.   DRUG ALLERGIES:   Allergies  Allergen Reactions  . Bee Venom Swelling  . Sulfasalazine Hives and Swelling  . Sulfa Antibiotics Hives and Swelling  . Warfarin And Related Other (See Comments)    Chest pain    VITALS:  Blood pressure (!) 113/51, pulse 77, temperature 97.6 F (36.4 C), temperature source Oral, resp. rate 20, height 6\' 4"  (1.93 m), weight 92.7 kg, SpO2 96 %.  PHYSICAL EXAMINATION:   Physical Exam  GENERAL:  85 y.o.-year-old patient lying in the bed with no acute distress.  EYES: Pupils equal, round, reactive to light and accommodation. No scleral icterus.  Extraocular muscles intact.  HEENT: Head atraumatic, normocephalic. Oropharynx and nasopharynx clear.  NECK:  Supple, no jugular venous distention. No thyroid enlargement, no tenderness.  LUNGS: Normal breath sounds bilaterally, no wheezing, rales, rhonchi. No use of accessory muscles of respiration.  CARDIOVASCULAR: S1, S2 normal. No murmurs, rubs, or gallops.  ABDOMEN: Soft, nontender, nondistended. Bowel sounds present. No organomegaly or mass.  EXTREMITIES: No cyanosis, clubbing or edema b/l.    NEUROLOGIC: Cranial nerves II through XII are intact. No focal Motor or sensory deficits b/l.   PSYCHIATRIC: The patient is alert and oriented x 3.  SKIN: No obvious rash, lesion, or ulcer.   LABORATORY PANEL:   CBC Recent Labs  Lab 09/21/18 0516 09/21/18 1052  WBC 8.0  --   HGB 11.9* 13.3  HCT 35.3* 40.1  PLT 243  --    ------------------------------------------------------------------------------------------------------------------ Chemistries  Recent Labs  Lab 09/21/18 0316  NA 137  K 4.3  CL 106  CO2 27  GLUCOSE 91  BUN 18  CREATININE 1.06  CALCIUM 8.8*   ------------------------------------------------------------------------------------------------------------------  Cardiac Enzymes Recent Labs  Lab 09/16/18 1203  TROPONINI <0.03   ------------------------------------------------------------------------------------------------------------------  RADIOLOGY:  No results found.   ASSESSMENT AND PLAN:  85 year old male patient with history of prostate cancer, coronary disease, hyperlipidemia, hypertension, paroxysmal atrial fibrillation currently under hospitalist service for orthostatic hypotension  *Acute orthostatic hypotension with associated dizziness/lightheadedness/near syncope  Improved, but still has positive orthostatic vital signs Cardiology input appreciated - ? [facility]hera in the outpatient setting Continue Florinef, Midodrine, check orthostatics every  a.m., fall precautions Continue to hold antihypertensive agents  *Acute recurrent melena Per patient/wife-noted black tarry stools similar to  previous episodes of diverticular bleeding for the last several days Hemoglobin stable, greater than 11  Continue to hold Xarelto, CBC daily, transfuse as needed, gastroenterology input appreciated-?  Endoscopy while in house   *Chronic nausea  Resolved Gastroenterology input appreciated Antiemetics PRN  *Coronary artery disease Stable Antihypertensives held given orthostatic hypotension with associated symptomatology  *Chronic paroxysmal atrial fibrillation Stable In sinus rhythm Continue amiodarone Xarelto currently on hold due to patient/wife complaint of lower GI bleeding for several days despite improved hemoglobin Cardizem and metoprolol on hold as stated above  *CKD stage III Stable Avoid nephrotoxic agents Monitor renal function  *Chronic BPH Flomax restarted every other day per patient demands-risk versus benefits discussed regarding its role in exacerbation of orthostatic hypotension  All the records are reviewed and case discussed with Care Management/Social Worker. Management plans discussed with the patient, family and they are in agreement.  CODE STATUS: Full code  DVT Prophylaxis: SCDs  TOTAL TIME TAKING CARE OF THIS PATIENT: 35 minutes.   POSSIBLE D/C IN 1 to 5 DAYS, DEPENDING ON CLINICAL CONDITION.  Avel Peace Daeja Helderman M.D on 09/21/2018 at 12:15 PM  Between 7am to 6pm - Pager - [phone]  After 6pm go to [email or web address] - password [email or web address] [facility]  Office  [phone]  CC: Primary care physician; [name], MD  Note: This dictation was prepared with Dragon dictation along with smaller phrase technology. Any transcriptional errors that result from this process are unintentional.

## 2018-09-21 NOTE — Progress Notes (Signed)
Nutrition Follow Up Note    DOCUMENTATION CODES:   Non-severe (moderate) malnutrition in context of chronic illness  INTERVENTION:   Ensure Enlive daily at bedtime, each supplement provides 350 kcal and 20 grams of protein  MVI daily   Liberalize diet   NUTRITION DIAGNOSIS:   Moderate Malnutrition related to chronic illness(prostate cancer, atrial fib, advanced age ) as evidenced by mild fat depletion, moderate muscle depletion.  -new diagnosis   GOAL:   Patient will meet greater than or equal to 90% of their needs  -progressing   MONITOR:   PO intake, Supplement acceptance, Labs, Weight trends, Skin, I & O's  ASSESSMENT:   85 year old male patient with history of prostate cancer, coronary disease, hyperlipidemia, hypertension, paroxysmal atrial fibrillation currently under hospitalist service for orthostatic hypotension   Met with pt in room today. Pt reports good appetite and oral intake pta. Pt reports eating 100% of meals in hospital and drinking one Ensure per day. Pt requests one Ensure daily at bedtime as he reports that drinking multiple Ensures makes him to full to eat. Per chart, pt is weight stable since admit. Recommend continue supplements and liberal diet.   Medications reviewed and include: synthroid, MVI, lovaza, NaCl '@35ml'$ /hr, protonix  Labs reviewed: Hgb 13.3, Hct 40.1, MCV 103.5(H), MCH 34.9(H) Folate 10.0, B12 321- 4/22   Diet Order:   Diet Order            Diet regular Room service appropriate? Yes; Fluid consistency: Thin  Diet effective now             EDUCATION NEEDS:   Not appropriate for education at this time  Skin:  Skin Assessment: Reviewed RN Assessment  Last BM:  2/2- Type 3  Height:   Ht Readings from Last 1 Encounters:  09/16/18 '6\' 4"'$  (1.93 m)    Weight:   Wt Readings from Last 1 Encounters:  09/21/18 92.7 kg    Ideal Body Weight:  91.8 kg  BMI:  Body mass index is 24.87 kg/m.  Estimated Nutritional Needs:    Kcal:  2200-2500kcal/day   Protein:  >120g/day   Fluid:  >2.2L/day   Koleen Distance MS, RD, LDN Pager #- [phone] Office#- [phone] After Hours Pager: [phone]

## 2018-09-21 NOTE — Plan of Care (Addendum)
Pt continues to be positive for orthostatic hypotension.  Problem: Education: Goal: Knowledge of General Education information will improve Description Including pain rating scale, medication(s)/side effects and non-pharmacologic comfort measures Outcome: Progressing   Problem: Health Behavior/Discharge Planning: Goal: Ability to manage health-related needs will improve Outcome: Progressing   Problem: Clinical Measurements: Goal: Will remain free from infection Outcome: Progressing

## 2018-09-21 NOTE — Progress Notes (Signed)
Progress Note  Patient Name: William Taylor Date of Encounter: 09/21/2018  Primary Cardiologist:   No primary care provider on file.   Subjective   The patient is now on both midodrine and small dose Florinef for orthostatic hypotension.  Also he is using an abdominal binder as recommended.  In spite of this, he continues to be orthostatic but reports some improvement in symptoms. He was seen by GI for dark stool known history of iron deficiency anemia.  Xarelto is currently on hold.  Inpatient Medications    Scheduled Meds: . amiodarone  200 mg Oral Daily  . feeding supplement (ENSURE ENLIVE)  237 mL Oral BID BM  . fenofibrate  160 mg Oral Daily  . finasteride  5 mg Oral Daily  . fludrocortisone  0.1 mg Oral Daily  . levobunolol  1 drop Right Eye BID  . levothyroxine  50 mcg Oral Q0600  . midodrine  10 mg Oral TID WC  . multivitamin with minerals  1 tablet Oral Daily  . omega-3 acid ethyl esters  1 g Oral Daily  . ranolazine  500 mg Oral BID  . sodium chloride flush  3 mL Intravenous Once  . tamsulosin  0.4 mg Oral QODAY   Continuous Infusions: . sodium chloride    . sodium chloride 30 mL/hr at 09/20/18 1722  . pantoprozole (PROTONIX) infusion 8 mg/hr (09/21/18 0310)   PRN Meds: cyproheptadine, docusate sodium, nitroGLYCERIN, ondansetron, polyethylene glycol   Vital Signs    Vitals:   09/20/18 2042 09/21/18 0353 09/21/18 0745 09/21/18 0824  BP: (!) 152/102 (!) 135/97 (!) 171/95 (!) 113/51  Pulse: 75 76 70 77  Resp:  18 17 20   Temp:  98.2 F (36.8 C) 97.7 F (36.5 C) 97.6 F (36.4 C)  TempSrc:  Oral Oral Oral  SpO2:  96% 98% 96%  Weight:  92.7 kg    Height:        Intake/Output Summary (Last 24 hours) at 09/21/2018 1132 Last data filed at 09/21/2018 0946 Gross per 24 hour  Intake 1513.57 ml  Output 750 ml  Net 763.57 ml   Filed Weights   09/18/18 0341 09/19/18 0413 09/21/18 0353  Weight: 92.9 kg 93.1 kg 92.7 kg    Telemetry    Normal sinus rhythm-  Personally Reviewed  ECG    NA - Personally Reviewed  Physical Exam   GEN: No acute distress.   Neck: No  JVD Cardiac:   RRR,  no murmurs, rubs, or gallops.  Respiratory: Clear  to auscultation bilaterally. GI: Soft, nontender, non-distended  MS: No  edema; No deformity. Neuro:  Nonfocal  Psych: Normal affect   Labs    Chemistry Recent Labs  Lab 09/18/18 0619 09/19/18 0532 09/20/18 0410 09/21/18 0316  NA 138 138  --  137  K 3.7 4.1  --  4.3  CL 108 108  --  106  CO2 24 27  --  27  GLUCOSE 98 95  --  91  BUN 19 16  --  18  CREATININE 1.22 1.29* 1.08 1.06  CALCIUM 8.9 8.8*  --  8.8*  GFRNONAA 53* 50* >60 >60  GFRAA >60 58* >60 >60  ANIONGAP 6 3*  --  4*     Hematology Recent Labs  Lab 09/17/18 0414 09/20/18 0410  09/20/18 1912 09/21/18 0316 09/21/18 0516  WBC 6.9 5.4  --   --   --  8.0  RBC 2.53* 3.35*  --   --   --  3.41*  HGB 9.5* 11.8*   < > 10.3* 10.1* 11.9*  HCT 26.7* 34.2*   < > 31.6* 30.3* 35.3*  MCV 105.5* 102.1*  --   --   --  103.5*  MCH 37.5* 35.2*  --   --   --  34.9*  MCHC 35.6 34.5  --   --   --  33.7  RDW 13.2 13.7  --   --   --  14.0  PLT 232 215  --   --   --  243   < > = values in this interval not displayed.    Cardiac Enzymes Recent Labs  Lab 09/16/18 1203  TROPONINI <0.03   No results for input(s): TROPIPOC in the last 168 hours.   BNPNo results for input(s): BNP, PROBNP in the last 168 hours.   DDimer No results for input(s): DDIMER in the last 168 hours.   Radiology    No results found.  Cardiac Studies   NA  Patient Profile     85 y.o. male with a hx of CAD s/p LAD stenting 2005 PCI-OM2 2012, PAF (Xarelto), PE 2006 in [place], PE following catheterization 2012, atypical chest pain, CKD III, cold agglutinin hemolytic anemia, prostate CA, orthostatic hypotension, HTN, HLDwho is being seen for the evaluation ofpre-syncope sx.   Assessment & Plan    1. ORTHOSTATIC HYPOTENSION:   On maximal dose midodrine and small  dose Florinef.  I do not recommend increasing Florinef given elevated blood pressure in the lying position.  Continue to use an abdominal binder.   The patient seems to be somewhat improved although he continues to be orthostatic. If this continues to be an issue, we might need to consider Northera in the outpatient  2. PAF:   On amiodarone.  Xarelto held because of black tarry stool.  Beta blocker and Cardizem held.  He seems to be in sinus rhythm by exam.     3. CKD :  Conservative medical management given his hypotension.  Creat is stable.     4. QUESTIONABLE GI BLEED:   The patient was seen by GI plan for endoscopic procedures.  The patient is cleared from a cardiac standpoint and acceptable risk. I think it is helpful to get the studies done while hospitalized in order to see if it safe to resume Xarelto.  For questions or updates, please contact [place] Please consult [email or web address] for contact info under Cardiology/STEMI.   Signed, Kathlyn Sacramento, MD  09/21/2018, 11:32 AM

## 2018-09-21 NOTE — Telephone Encounter (Signed)
Copied from [place] [id number]. Topic: General - Other >> Sep 21, 2018  9:51 AM Sheran Luz wrote: Reason for CRM: Moshe Salisbury, with [employer] on behalf of [employer], calling requesting to speak with whomever handles prior authorization for MRI. Regarding request for cpt code [phone].   Moshe Salisbury is requesting a call back at [phone]; option 4.  Case# [id number]

## 2018-09-22 ENCOUNTER — Encounter: Payer: Self-pay | Admitting: Anesthesiology

## 2018-09-22 ENCOUNTER — Inpatient Hospital Stay: Payer: Medicare HMO | Admitting: Anesthesiology

## 2018-09-22 ENCOUNTER — Ambulatory Visit: Payer: Medicare HMO | Admitting: Gastroenterology

## 2018-09-22 ENCOUNTER — Encounter
Admission: EM | Disposition: A | Discharge status: Home / Self Care | Payer: Self-pay | Source: Home / Self Care | Attending: Family Medicine

## 2018-09-22 DIAGNOSIS — K5710 Diverticulosis of small intestine without perforation or abscess without bleeding: Secondary | ICD-10-CM

## 2018-09-22 DIAGNOSIS — Z8719 Personal history of other diseases of the digestive system: Secondary | ICD-10-CM

## 2018-09-22 DIAGNOSIS — K921 Melena: Secondary | ICD-10-CM

## 2018-09-22 DIAGNOSIS — K922 Gastrointestinal hemorrhage, unspecified: Secondary | ICD-10-CM

## 2018-09-22 HISTORY — PX: ENTEROSCOPY: SHX5533

## 2018-09-22 LAB — T4, FREE: Free T4: 1.41 ng/dL (ref 0.82–1.77)

## 2018-09-22 LAB — CORTISOL-AM, BLOOD: Cortisol - AM: 12 ug/dL (ref 6.7–22.6)

## 2018-09-22 SURGERY — ENTEROSCOPY
Anesthesia: General

## 2018-09-22 MED ORDER — PROPOFOL 10 MG/ML IV BOLUS
INTRAVENOUS | Status: DC | PRN
  Administered 2018-09-22: 60 mg via INTRAVENOUS

## 2018-09-22 MED ORDER — PROPOFOL 500 MG/50ML IV EMUL
INTRAVENOUS | Status: DC | PRN
  Administered 2018-09-22: 140 ug/kg/min via INTRAVENOUS

## 2018-09-22 MED ORDER — PANTOPRAZOLE SODIUM 40 MG PO TBEC
40.0000 mg | DELAYED_RELEASE_TABLET | Freq: Every day | ORAL | Status: DC
  Administered 2018-09-23: 40 mg via ORAL
  Filled 2018-09-22: qty 1

## 2018-09-22 NOTE — Care Management Important Message (Signed)
Copy of signed Medicare IM left with patient in room. 

## 2018-09-22 NOTE — Progress Notes (Signed)
Pt returns from Endo, per report no active bleeding found, no pain, VSS. Will continue to monitor.

## 2018-09-22 NOTE — Anesthesia Postprocedure Evaluation (Signed)
Anesthesia Post Note  Patient: William Taylor  Procedure(s) Performed: ENTEROSCOPY (N/A )  Patient location during evaluation: Endoscopy Anesthesia Type: General Level of consciousness: awake and alert Pain management: pain level controlled Vital Signs Assessment: post-procedure vital signs reviewed and stable Respiratory status: spontaneous breathing, nonlabored ventilation, respiratory function stable and patient connected to nasal cannula oxygen Cardiovascular status: blood pressure returned to baseline and stable Postop Assessment: no apparent nausea or vomiting Anesthetic complications: no     Last Vitals:  Vitals:   09/22/18 1256 09/22/18 1403  BP: (!) 152/100 (!) 158/96  Pulse: 82 88  Resp: 14 18  Temp: 37.3 C 36.9 C  SpO2: 95% 94%    Last Pain:  Vitals:   09/22/18 1403  TempSrc: Oral  PainSc:                  [name]

## 2018-09-22 NOTE — Anesthesia Post-op Follow-up Note (Signed)
Anesthesia QCDR form completed.        

## 2018-09-22 NOTE — Progress Notes (Addendum)
[place] [facility] [place] NAME: William Taylor    MR#:  [id number]  DATE OF BIRTH:  [birth year]  SUBJECTIVE:  Continues to complain of lightheadedness with standing/ambulation, wife at the bedside-continues to complain of black tarry stools similar to episode of diverticular bleeding-Per wife/patient has been ongoing for several days, for endoscopy later today by gastroenterology, cardiology input appreciated  REVIEW OF SYSTEMS:    ROS  CONSTITUTIONAL: No documented fever. No fatigue, weakness. No weight gain, no weight loss.  EYES: No blurry or double vision.  ENT: No tinnitus. No postnasal drip. No redness of the oropharynx.  RESPIRATORY: No cough, no wheeze, no hemoptysis. No dyspnea.  CARDIOVASCULAR: No chest pain. No orthopnea. No palpitations. No syncope.  GASTROINTESTINAL: No nausea, no vomiting or diarrhea. No abdominal pain. No melena or hematochezia.  GENITOURINARY: No dysuria or hematuria.  ENDOCRINE: No polyuria or nocturia. No heat or cold intolerance.  HEMATOLOGY: No anemia. No bruising. No bleeding.  INTEGUMENTARY: No rashes. No lesions.  MUSCULOSKELETAL: No arthritis. No swelling. No gout.  NEUROLOGIC: No numbness, tingling, or ataxia. No seizure-type activity.  Has dizziness PSYCHIATRIC: No anxiety. No insomnia. No ADD.   DRUG ALLERGIES:   Allergies  Allergen Reactions  . Bee Venom Swelling  . Sulfasalazine Hives and Swelling  . Sulfa Antibiotics Hives and Swelling  . Warfarin And Related Other (See Comments)    Chest pain    VITALS:  Blood pressure (!) 87/49, pulse 83, temperature 98.2 F (36.8 C), temperature source Oral, resp. rate 18, height 6\' 4"  (1.93 m), weight 92.7 kg, SpO2 95 %.  PHYSICAL EXAMINATION:   Physical Exam  GENERAL:  85 y.o.-year-old patient lying in the bed with no acute distress.  EYES: Pupils equal, round, reactive to light and accommodation. No scleral icterus. Extraocular muscles intact.   HEENT: Head atraumatic, normocephalic. Oropharynx and nasopharynx clear.  NECK:  Supple, no jugular venous distention. No thyroid enlargement, no tenderness.  LUNGS: Normal breath sounds bilaterally, no wheezing, rales, rhonchi. No use of accessory muscles of respiration.  CARDIOVASCULAR: S1, S2 normal. No murmurs, rubs, or gallops.  ABDOMEN: Soft, nontender, nondistended. Bowel sounds present. No organomegaly or mass.  EXTREMITIES: No cyanosis, clubbing or edema b/l.    NEUROLOGIC: Cranial nerves II through XII are intact. No focal Motor or sensory deficits b/l.   PSYCHIATRIC: The patient is alert and oriented x 3.  SKIN: No obvious rash, lesion, or ulcer.   LABORATORY PANEL:   CBC Recent Labs  Lab 09/21/18 0516 09/21/18 1052  WBC 8.0  --   HGB 11.9* 13.3  HCT 35.3* 40.1  PLT 243  --    ------------------------------------------------------------------------------------------------------------------ Chemistries  Recent Labs  Lab 09/21/18 0316  NA 137  K 4.3  CL 106  CO2 27  GLUCOSE 91  BUN 18  CREATININE 1.06  CALCIUM 8.8*   ------------------------------------------------------------------------------------------------------------------  Cardiac Enzymes Recent Labs  Lab 09/16/18 1203  TROPONINI <0.03   ------------------------------------------------------------------------------------------------------------------  RADIOLOGY:  No results found.   ASSESSMENT AND PLAN:  85 year old male patient with history of prostate cancer, coronary disease, hyperlipidemia, hypertension, paroxysmal atrial fibrillation currently under hospitalist service for orthostatic hypotension  *Acute orthostatic hypotension with associated dizziness/lightheadedness/near syncope  Improved -no longer has near syncope, but still has positive orthostatic vital signs with continued lightheadedness, exacerbated by Flomax (restarted per patient demands) Cardiology input appreciated - ?  Northera in the outpatient setting Continue Florinef, Midodrine, check orthostatics every a.m., fall precautions Continue to hold antihypertensive  agents  *Acute recurrent melena Per patient/wife-noted black tarry stools similar to previous episodes of diverticular bleeding for the last several days Hemoglobin stable, greater than 13  Continue to hold Xarelto, Protonix drip, CBC daily, transfuse as needed, for endoscopy later today by gastroenterology   *Chronic hypothyroidism Stable TSH noted Synthroid increased-we will need to have TSH rechecked in 6 weeks for reevaluation    *Chronic nausea  Resolved Gastroenterology input appreciated Antiemetics PRN  *Coronary artery disease Stable Antihypertensives held given orthostatic hypotension with associated symptomatology  *Chronic paroxysmal atrial fibrillation Stable In sinus rhythm Continue amiodarone Xarelto currently on hold due to patient/wife complaint of lower GI bleeding for several days despite improved hemoglobin Cardizem and metoprolol on hold as stated above  *CKD stage III Stable Avoid nephrotoxic agents Monitor renal function  *Chronic BPH Flomax restarted every other day per patient demands-risk versus benefits discussed regarding its role in exacerbation of orthostatic hypotension  All the records are reviewed and case discussed with Care Management/Social Worker. Management plans discussed with the patient, family and they are in agreement.  CODE STATUS: Full code  DVT Prophylaxis: SCDs  TOTAL TIME TAKING CARE OF THIS PATIENT: 35 minutes.   POSSIBLE D/C IN 1 to 4 DAYS, DEPENDING ON CLINICAL CONDITION.  William Taylor M.D on 09/22/2018 at 11:59 AM  Between 7am to 6pm - Pager - [phone]  After 6pm go to [email or web address] - password [email or web address] [facility]  Office  [phone]  CC: Primary care physician; [name], MD  Note: This dictation was prepared with Dragon  dictation along with smaller phrase technology. Any transcriptional errors that result from this process are unintentional.

## 2018-09-22 NOTE — Op Note (Signed)
[facility] Gastroenterology Patient Name: William Taylor Procedure Date: 09/22/2018 11:46 AM MRN: [id number] Account #: [id number] Date of Birth: 12-07-1929 Admit Type: Inpatient Age: 85 Room: [facility] ENDO ROOM 1 Gender: Male Note Status: Finalized Procedure:            Small bowel enteroscopy Indications:          Melena, GI bleeding source not documented by previous                        EGD and colonoscopy, GI bleeding source not documented                        by previous video capsule endoscopy Providers:            Lin Landsman MD, MD Referring MD:         Angela Adam. Caryl Bis (Referring MD) Medicines:            Monitored Anesthesia Care Complications:        No immediate complications. Estimated blood loss: None. Procedure:            Pre-Anesthesia Assessment:                       - Prior to the procedure, a History and Physical was                        performed, and patient medications and allergies were                        reviewed. The patient is competent. The risks and                        benefits of the procedure and the sedation options and                        risks were discussed with the patient. All questions                        were answered and informed consent was obtained.                        Patient identification and proposed procedure were                        verified by the physician, the nurse, the                        anesthesiologist, the anesthetist and the technician in                        the pre-procedure area in the procedure room in the                        endoscopy suite. Mental Status Examination: alert and                        oriented. Airway Examination: normal oropharyngeal                        airway and neck mobility. Respiratory Examination:  clear to auscultation. CV Examination: normal.                        Prophylactic Antibiotics: The patient does not  require                        prophylactic antibiotics. Prior Anticoagulants: The                        patient has taken Xarelto (rivaroxaban), last dose was                        2 days prior to procedure. ASA Grade Assessment: III -                        A patient with severe systemic disease. After reviewing                        the risks and benefits, the patient was deemed in                        satisfactory condition to undergo the procedure. The                        anesthesia plan was to use monitored anesthesia care                        (MAC). Immediately prior to administration of                        medications, the patient was re-assessed for adequacy                        to receive sedatives. The heart rate, respiratory rate,                        oxygen saturations, blood pressure, adequacy of                        pulmonary ventilation, and response to care were                        monitored throughout the procedure. The physical status                        of the patient was re-assessed after the procedure.                       After obtaining informed consent, the endoscope was                        passed under direct vision. Throughout the procedure,                        the patient's blood pressure, pulse, and oxygen                        saturations were monitored continuously. The  Colonoscope was introduced through the mouth and                        advanced to the proximal jejunum. The small bowel                        enteroscopy was accomplished without difficulty. The                        patient tolerated the procedure well. Findings:      A diverticulum was found in the proximal jejunum.      A diverticulum was found in the fourth portion of the duodenum.      No other bleeding source identified      The esophagus was normal.      The stomach was normal.      A 1 cm hiatal hernia was present, a  submucosal lesion in the hernial       sac, right below the GE junction, this might represent a prominent       gastric fold. The overlying mucosa appeared normal. Biopsies were not       performed due to risk for bleeding. This lesion is not the source of       bleeding Impression:           - Non-bleeding jejunal diverticulum.                       - Duodenal diverticulum.                       - Normal esophagus.                       - Normal stomach.                       - No specimens collected. Recommendation:       - Return patient to hospital ward for ongoing care.                       - Advance diet as tolerated today.                       - Continue present medications.                       - Can resume xarelto from GI stand point, if pt                        rebleeds recommend repeat VCE Procedure Code(s):    --- Professional ---                       [phone], Small intestinal endoscopy, enteroscopy beyond                        second portion of duodenum, not including ileum;                        diagnostic, including collection of specimen(s) by                        brushing or washing, when performed (separate  procedure) Diagnosis Code(s):    --- Professional ---                       K57.10, Diverticulosis of small intestine without                        perforation or abscess without bleeding                       K92.1, Melena (includes Hematochezia)                       K92.2, Gastrointestinal hemorrhage, unspecified CPT copyright 2018 American Medical Association. All rights reserved. The codes documented in this report are preliminary and upon coder review may  be revised to meet current compliance requirements. Dr. Ulyess Mort Lin Landsman MD, MD 09/22/2018 12:17:15 PM This report has been signed electronically. Number of Addenda: 0 Note Initiated On: 09/22/2018 11:46 AM      [facility]

## 2018-09-22 NOTE — Progress Notes (Signed)
Per Dr. Marius Ditch can d/c Protonix gtt and transition to Protonix PO 40 mg daily.

## 2018-09-22 NOTE — Progress Notes (Signed)
Pt off the floor for enterescopy

## 2018-09-22 NOTE — Anesthesia Preprocedure Evaluation (Signed)
Anesthesia Evaluation  Patient identified by MRN, date of birth, ID band Patient awake    Reviewed: Allergy & Precautions, NPO status , Patient's Chart, lab work & pertinent test results, reviewed documented beta blocker date and time   Airway Mallampati: II  TM Distance: >3 FB     Dental  (+) Chipped   Pulmonary           Cardiovascular hypertension, Pt. on medications and Pt. on home beta blockers + angina + CAD, + Past MI and + DOE  + dysrhythmias Atrial Fibrillation      Neuro/Psych    GI/Hepatic   Endo/Other    Renal/GU Renal disease     Musculoskeletal   Abdominal   Peds  Hematology  (+) anemia ,   Anesthesia Other Findings EKG OK. hAS HAD 2 CARDIAC STENTS. Hb 13.3.  EF 55-60. On mididrine. Orthostatic.  Reproductive/Obstetrics                             Anesthesia Physical Anesthesia Plan  ASA: III  Anesthesia Plan: General   Post-op Pain Management:    Induction: Intravenous  PONV Risk Score and Plan:   Airway Management Planned:   Additional Equipment:   Intra-op Plan:   Post-operative Plan:   Informed Consent: I have reviewed the patients History and Physical, chart, labs and discussed the procedure including the risks, benefits and alternatives for the proposed anesthesia with the patient or authorized representative who has indicated his/her understanding and acceptance.       Plan Discussed with: CRNA  Anesthesia Plan Comments:         Anesthesia Quick Evaluation

## 2018-09-22 NOTE — Transfer of Care (Signed)
Immediate Anesthesia Transfer of Care Note  Patient: William Taylor  Procedure(s) Performed: ENTEROSCOPY (N/A )  Patient Location: PACU  Anesthesia Type:General  Level of Consciousness: sedated  Airway & Oxygen Therapy: Patient Spontanous Breathing and Patient connected to nasal cannula oxygen  Post-op Assessment: Report given to RN and Post -op Vital signs reviewed and stable  Post vital signs: Reviewed and stable  Last Vitals:  Vitals Value Taken Time  BP 118/79 09/22/2018 12:14 PM  Temp 36.3 C 09/22/2018 12:14 PM  Pulse 95 09/22/2018 12:14 PM  Resp 17 09/22/2018 12:14 PM  SpO2 96 % 09/22/2018 12:14 PM  Vitals shown include unvalidated device data.  Last Pain:  Vitals:   09/22/18 1214  TempSrc:   PainSc: 0-No pain         Complications: No apparent anesthesia complications

## 2018-09-23 ENCOUNTER — Encounter: Payer: Self-pay | Admitting: Gastroenterology

## 2018-09-23 LAB — HEMOGLOBIN AND HEMATOCRIT, BLOOD
HCT: 29.5 % — ABNORMAL LOW (ref 39.0–52.0)
Hemoglobin: 10 g/dL — ABNORMAL LOW (ref 13.0–17.0)

## 2018-09-23 LAB — BASIC METABOLIC PANEL
Anion gap: 5 (ref 5–15)
BUN: 12 mg/dL (ref 8–23)
CO2: 25 mmol/L (ref 22–32)
Calcium: 8.6 mg/dL — ABNORMAL LOW (ref 8.9–10.3)
Chloride: 107 mmol/L (ref 98–111)
Creatinine, Ser: 1.05 mg/dL (ref 0.61–1.24)
GFR calc Af Amer: >60 mL/min (ref >60)
Glucose, Bld: 103 mg/dL — ABNORMAL HIGH (ref 70–99)
Potassium: 4.6 mmol/L (ref 3.5–5.1)
Sodium: 137 mmol/L (ref 135–145)

## 2018-09-23 LAB — MAGNESIUM: Magnesium: 1.7 mg/dL (ref 1.7–2.4)

## 2018-09-23 MED ORDER — RIVAROXABAN 20 MG PO TABS: 20.0000 mg | ORAL_TABLET | Freq: Every day | ORAL | Status: DC

## 2018-09-23 MED ORDER — FLUDROCORTISONE ACETATE 0.1 MG PO TABS: 0.1000 mg | ORAL_TABLET | Freq: Every day | ORAL | 0 refills | Status: DC

## 2018-09-23 MED ORDER — PANTOPRAZOLE SODIUM 40 MG PO TBEC: 40.0000 mg | DELAYED_RELEASE_TABLET | Freq: Every day | ORAL | 0 refills | Status: DC

## 2018-09-23 MED ORDER — MIDODRINE HCL 10 MG PO TABS: 10.0000 mg | ORAL_TABLET | Freq: Three times a day (TID) | ORAL | 0 refills | Status: DC

## 2018-09-23 MED ORDER — TAMSULOSIN HCL 0.4 MG PO CAPS: 0.4000 mg | ORAL_CAPSULE | ORAL | 0 refills | Status: DC

## 2018-09-23 MED ORDER — AMIODARONE HCL 200 MG PO TABS: 200.0000 mg | ORAL_TABLET | Freq: Every day | ORAL | 0 refills | Status: DC

## 2018-09-23 MED ORDER — ENSURE ENLIVE PO LIQD: 237.0000 mL | ORAL | 0 refills | Status: DC

## 2018-09-23 MED ORDER — LEVOTHYROXINE SODIUM 50 MCG PO TABS: 50.0000 ug | ORAL_TABLET | Freq: Every day | ORAL | 0 refills | Status: DC

## 2018-09-23 NOTE — Discharge Summary (Addendum)
[facility] NAME: William Taylor    MR#:  [id number]  DATE OF BIRTH:  1929/09/22  DATE OF ADMISSION:  09/16/2018 ADMITTING PHYSICIAN: Vaughan Basta, MD  DATE OF DISCHARGE: No discharge date for patient encounter.  PRIMARY CARE PHYSICIAN: [name], MD    ADMISSION DIAGNOSIS:  Orthostatic hypotension [I95.1] Dehydration [E86.0]  DISCHARGE DIAGNOSIS:  Principal Problem:   Orthostatic hypotension Active Problems:   Malnutrition of moderate degree   Gastrointestinal hemorrhage with melena   SECONDARY DIAGNOSIS:   Past Medical History:  Diagnosis Date  . [facility])    Prostate, followed by Dr. Jacqlyn Larsen  . Colon polyp   . Coronary artery disease   . Hyperlipidemia   . Hypertension   . MI (myocardial infarction) ([place])    2015  . PAF (paroxysmal atrial fibrillation) ([place])    a. on xarelto  . Skin cancer     HOSPITAL COURSE:   85 year old male patient with history of prostate cancer, coronary disease, hyperlipidemia, hypertension, paroxysmal atrial fibrillation currently under hospitalist service for orthostatic hypotension  *Acute orthostatic hypotension with associated dizziness/lightheadedness/near syncope  Improved -no longer has near syncope, but still has positive orthostatic vital signs with continued lightheadedness, exacerbated by Flomax (restarted per patient demands) Cardiology did see patient while in house -possible Northera in the outpatient setting Continue Florinef, Midodrine, fall precautions, given educational material regarding orthostatic hypotension prior to discharge, antihypertensive agents discontinued indefinitely Noted severe hypertension when laying down blood profound hypotension with standing  *Acute recurrent melena Resolved Per patient/wife-noted black tarry stools similar to previous episodes of diverticular bleeding for the last several days Hemoglobin stable,  greater than 11  Held Xarelto while in house, placed on Protonix drip, 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 Marius Ditch September 22, 2018   *Chronic hypothyroidism Stable TSH was minimally elevated  Synthroid increased-we will need to have TSH rechecked in 6 weeks for reevaluation    *Chronic nausea  Resolved Gastroenterology did see patient while in house  antiemetics PRN  *Coronary artery disease Stable Antihypertensives discontinue due to severe orthostatic hypotension   *Chronic paroxysmal atrial fibrillation Stable In sinus rhythm Continue amiodarone, Xarelto Cardizem and Lopressor discontinued given severe orthostatic hypotension   *CKD stage III Stable Avoided nephrotoxic agents Monitor renal function  *Chronic BPH Flomax restarted every other day per patient demands-risk versus benefits discussed regarding its role in exacerbation of orthostatic hypotension  DISCHARGE CONDITIONS:   stable  CONSULTS OBTAINED:  Treatment Team:  [name], MD  DRUG ALLERGIES:   Allergies  Allergen Reactions  . Bee Venom Swelling  . Sulfasalazine Hives and Swelling  . Sulfa Antibiotics Hives and Swelling  . Warfarin And Related Other (See Comments)    Chest pain    DISCHARGE MEDICATIONS:   Allergies as of 09/23/2018      Reactions   Bee Venom Swelling   Sulfasalazine Hives, Swelling   Sulfa Antibiotics Hives, Swelling   Warfarin And Related Other (See Comments)   Chest pain      Medication List    STOP taking these medications   diltiazem 120 MG 24 hr capsule Commonly known as:  CARDIZEM CD   Levothyroxine Sodium 25 MCG Caps Replaced by:  levothyroxine 50 MCG tablet   metoprolol tartrate 25 MG tablet Commonly known as:  LOPRESSOR     TAKE these medications   amiodarone 200 MG tablet Commonly known as:  PACERONE Take 1  tablet (200 mg total) by mouth daily. Start taking on:  September 24, 2018 What changed:    when to take  this  reasons to take this   cyproheptadine 4 MG tablet Commonly known as:  PERIACTIN Take 1 tablet (4 mg total) by mouth 3 (three) times daily as needed for allergies.   feeding supplement (ENSURE ENLIVE) Liqd Take 237 mLs by mouth daily.   fenofibrate micronized 130 MG capsule Commonly known as:  ANTARA TAKE 1 CAPSULE BY MOUTH  DAILY BEFORE BREAKFAST   finasteride 5 MG tablet Commonly known as:  PROSCAR Take 1 tablet (5 mg total) by mouth daily.   Fish Oil 1000 MG Caps Take 1,000 mg by mouth daily.   fludrocortisone 0.1 MG tablet Commonly known as:  FLORINEF Take 1 tablet (0.1 mg total) by mouth daily.   levobunolol 0.5 % ophthalmic solution Commonly known as:  BETAGAN Place 1 drop into the right eye 2 (two) times daily.   levothyroxine 50 MCG tablet Commonly known as:  SYNTHROID, LEVOTHROID Take 1 tablet (50 mcg total) by mouth daily at 6 (six) AM. Start taking on:  September 24, 2018 Replaces:  Levothyroxine Sodium 25 MCG Caps   midodrine 10 MG tablet Commonly known as:  PROAMATINE Take 1 tablet (10 mg total) by mouth 3 (three) times daily with meals.   nitroGLYCERIN 0.4 MG SL tablet Commonly known as:  NITROSTAT DISSOLVE UNDER THE TONGUE 1 TABLET EVERY 5 MINUTES AS NEEDED FOR CHEST PAIN   ondansetron 8 MG disintegrating tablet Commonly known as:  ZOFRAN ODT Take 0.5 tablets (4 mg total) by mouth every 8 (eight) hours as needed for nausea.   pantoprazole 40 MG tablet Commonly known as:  PROTONIX Take 1 tablet (40 mg total) by mouth daily. Start taking on:  September 24, 2018   polyethylene glycol powder powder Commonly known as:  GLYCOLAX/MIRALAX Take 17 g by mouth 2 (two) times daily as needed.   pravastatin 40 MG tablet Commonly known as:  PRAVACHOL TAKE 1 TABLET BY MOUTH AT  BEDTIME   ranolazine 500 MG 12 hr tablet Commonly known as:  RANEXA Take 1 tablet (500 mg total) by mouth 2 (two) times daily.   tamsulosin 0.4 MG Caps capsule Commonly known  as:  FLOMAX Take 1 capsule (0.4 mg total) by mouth every other day. Start taking on:  September 25, 2018 What changed:  when to take this   XARELTO 20 MG Tabs tablet Generic drug:  rivaroxaban TAKE 1 TABLET DAILY        DISCHARGE INSTRUCTIONS:      If you experience worsening of your admission symptoms, develop shortness of breath, life threatening emergency, suicidal or homicidal thoughts you must seek medical attention immediately by calling 911 or calling your MD immediately  if symptoms less severe.  You Must read complete instructions/literature along with all the possible adverse reactions/side effects for all the Medicines you take and that have been prescribed to you. Take any new Medicines after you have completely understood and accept all the possible adverse reactions/side effects.   Please note  You were cared for by a hospitalist during your hospital stay. If you have any questions about your discharge medications or the care you received while you were in the hospital after you are discharged, you can call the unit and asked to speak with the hospitalist on call if the hospitalist that took care of you is not available. Once you are discharged, your primary care physician  will handle any further medical issues. Please note that NO REFILLS for any discharge medications will be authorized once you are discharged, as it is imperative that you return to your primary care physician (or establish a relationship with a primary care physician if you do not have one) for your aftercare needs so that they can reassess your need for medications and monitor your lab values.    Today   CHIEF COMPLAINT:   Chief Complaint  Patient presents with  . Dizziness  . Hypotension    HISTORY OF PRESENT ILLNESS:  85 y.o. male with a known history of prostate cancer, colon polyps, coronary artery disease, hyperlipidemia, hypertension, paroxysmal atrial fibrillation, skin cancer-since  Christmas he is feeling intermittent episodes of dizziness while standing up from lying down to sitting position and also feels nauseated with that.  This is bothering him to the point that he has decreased appetite due to his recurrent nausea and he lost 12 pound weight in last 1 to 2 months. He went to his primary care doctor and also came to emergency room for this and he has multiple work-ups done but no solution was found so far.  His CT scan on chest and abdomen and echocardiogram were done also he was checked for infections by use urine analysis that was negative. Today again in emergency room his blood pressure noted to have significant drop on standing up compared to lying down so ER physician suggested to admit to hospitalist service for further management.    VITAL SIGNS:  Blood pressure (!) 176/77, pulse 75, temperature 98 F (36.7 C), temperature source Oral, resp. rate 16, height 6\' 4"  (1.93 m), weight 92.7 kg, SpO2 97 %.  I/O:    Intake/Output Summary (Last 24 hours) at 09/23/2018 1144 Last data filed at 09/23/2018 0954 Gross per 24 hour  Intake 1024.16 ml  Output 0 ml  Net 1024.16 ml    PHYSICAL EXAMINATION:  GENERAL:  85 y.o.-year-old patient lying in the bed with no acute distress.  EYES: Pupils equal, round, reactive to light and accommodation. No scleral icterus. Extraocular muscles intact.  HEENT: Head atraumatic, normocephalic. Oropharynx and nasopharynx clear.  NECK:  Supple, no jugular venous distention. No thyroid enlargement, no tenderness.  LUNGS: Normal breath sounds bilaterally, no wheezing, rales,rhonchi or crepitation. No use of accessory muscles of respiration.  CARDIOVASCULAR: S1, S2 normal. No murmurs, rubs, or gallops.  ABDOMEN: Soft, non-tender, non-distended. Bowel sounds present. No organomegaly or mass.  EXTREMITIES: No pedal edema, cyanosis, or clubbing.  NEUROLOGIC: Cranial nerves II through XII are intact. Muscle strength 5/5 in all extremities.  Sensation intact. Gait not checked.  PSYCHIATRIC: The patient is alert and oriented x 3.  SKIN: No obvious rash, lesion, or ulcer.   DATA REVIEW:   CBC Recent Labs  Lab 09/21/18 0516  09/23/18 0441  WBC 8.0  --   --   HGB 11.9*   < > 10.0*  HCT 35.3*   < > 29.5*  PLT 243  --   --    < > = values in this interval not displayed.    Chemistries  Recent Labs  Lab 09/23/18 0441  NA 137  K 4.6  CL 107  CO2 25  GLUCOSE 103*  BUN 12  CREATININE 1.05  CALCIUM 8.6*  MG 1.7    Cardiac Enzymes Recent Labs  Lab 09/16/18 1203  TROPONINI <0.03    Microbiology Results  Results for orders placed or performed in visit on 07/05/16  Fecal occult blood, imunochemical     Status: Abnormal   Collection Time: 07/05/16  4:40 PM  Result Value Ref Range Status   Fecal Occult Bld Positive (A) Negative Final    RADIOLOGY:  No results found.  EKG:   Orders placed or performed during the hospital encounter of 09/16/18  . ED EKG  . ED EKG  . EKG 12-Lead  . EKG 12-Lead      Management plans discussed with the patient, family and they are in agreement.  CODE STATUS:     Code Status Orders  (From admission, onward)         Start     Ordered   09/16/18 1700  Do not attempt resuscitation (DNR)  Continuous    Question Answer Comment  In the event of cardiac or respiratory ARREST Do not call a "code blue"   In the event of cardiac or respiratory ARREST Do not perform Intubation, CPR, defibrillation or ACLS   In the event of cardiac or respiratory ARREST Use medication by any route, position, wound care, and other measures to relive pain and suffering. May use oxygen, suction and manual treatment of airway obstruction as needed for comfort.      09/16/18 1659        Code Status History    Date Active Date Inactive Code Status Order ID Comments User Context   03/07/2017 1810 03/09/2017 1638 Full Code [id number]  Lance Coon, MD Inpatient   07/06/2015 2225 07/07/2015 1914  Full Code [id number]  Hower, Aaron Mose, MD ED    Advance Directive Documentation     Most Recent Value  Type of Advance Directive  Healthcare Power of Attorney  Pre-existing out of facility DNR order (yellow form or pink MOST form)  -  "MOST" Form in Place?  -      TOTAL TIME TAKING CARE OF THIS PATIENT: 40 minutes.    Avel Peace Kolin Erdahl M.D on 09/23/2018 at 11:44 AM  Between 7am to 6pm - Pager - [phone]  After 6pm go to [email or web address] - password [email or web address] [facility]  Office  [phone]  CC: Primary care physician; [name], MD   Note: This dictation was prepared with Dragon dictation along with smaller phrase technology. Any transcriptional errors that result from this process are unintentional.

## 2018-09-23 NOTE — Progress Notes (Signed)
Progress Note  Patient Name: William Taylor Date of Encounter: 09/23/2018  Primary Cardiologist: Dr. Rockey Situ  Subjective   Patient reported still feeling symptoms with orthostatic hypotension including lightheadedness and tachycardic rates. S/p endoscopy with no c/o post-procedure bleeding or pain. He denied any changes in his stool, hematuria.  No chest pain or palpitations. Only tachycardic with orthostatic hypotension, specifically upon ambulation.  Inpatient Medications    Scheduled Meds: . amiodarone  200 mg Oral Daily  . feeding supplement (ENSURE ENLIVE)  237 mL Oral Q24H  . fenofibrate  160 mg Oral Daily  . finasteride  5 mg Oral Daily  . fludrocortisone  0.1 mg Oral Daily  . levobunolol  1 drop Right Eye BID  . levothyroxine  50 mcg Oral Q0600  . midodrine  10 mg Oral TID WC  . multivitamin with minerals  1 tablet Oral Daily  . omega-3 acid ethyl esters  1 g Oral Daily  . pantoprazole  40 mg Oral Daily  . ranolazine  500 mg Oral BID  . rivaroxaban  20 mg Oral Daily  . sodium chloride flush  3 mL Intravenous Once  . tamsulosin  0.4 mg Oral QODAY   Continuous Infusions: . sodium chloride     PRN Meds: cyproheptadine, docusate sodium, nitroGLYCERIN, ondansetron, polyethylene glycol   Vital Signs    Vitals:   09/22/18 1833 09/22/18 1919 09/23/18 0520 09/23/18 0845  BP: 127/86 (!) 136/97 134/81 (!) 176/77  Pulse: 93 87 72 75  Resp:    16  Temp:  98.2 F (36.8 C) 98.3 F (36.8 C) 98 F (36.7 C)  TempSrc:  Oral Oral Oral  SpO2: 96% 97% 93% 97%  Weight:      Height:        Intake/Output Summary (Last 24 hours) at 09/23/2018 1113 Last data filed at 09/23/2018 0954 Gross per 24 hour  Intake 1024.16 ml  Output 0 ml  Net 1024.16 ml   Filed Weights   09/18/18 0341 09/19/18 0413 09/21/18 0353  Weight: 92.9 kg 93.1 kg 92.7 kg    Telemetry    Not on telemetry- Personally Reviewed  ECG    NA - Personally Reviewed  Physical Exam   GEN: No acute  distress.  Wife by bedside Neck: No  JVD Cardiac:   RRR,  no murmurs, rubs, S4 verus flow murmur.  Respiratory: Clear  to auscultation bilaterally. GI: Soft, nontender, non-distended  MS: Trace bilateral lower extremity edema, compression stockings, No deformity. Neuro:  Nonfocal  Psych: Normal affect   Labs    Chemistry Recent Labs  Lab 09/19/18 0532 09/20/18 0410 09/21/18 0316 09/23/18 0441  NA 138  --  137 137  K 4.1  --  4.3 4.6  CL 108  --  106 107  CO2 27  --  27 25  GLUCOSE 95  --  91 103*  BUN 16  --  18 12  CREATININE 1.29* 1.08 1.06 1.05  CALCIUM 8.8*  --  8.8* 8.6*  GFRNONAA 50* >60 >60 >60  GFRAA 58* >60 >60 >60  ANIONGAP 3*  --  4* 5     Hematology Recent Labs  Lab 09/17/18 0414 09/20/18 0410  09/21/18 0516 09/21/18 1052 09/23/18 0441  WBC 6.9 5.4  --  8.0  --   --   RBC 2.53* 3.35*  --  3.41*  --   --   HGB 9.5* 11.8*   < > 11.9* 13.3 10.0*  HCT 26.7* 34.2*   < >  35.3* 40.1 29.5*  MCV 105.5* 102.1*  --  103.5*  --   --   MCH 37.5* 35.2*  --  34.9*  --   --   MCHC 35.6 34.5  --  33.7  --   --   RDW 13.2 13.7  --  14.0  --   --   PLT 232 215  --  243  --   --    < > = values in this interval not displayed.    Cardiac Enzymes Recent Labs  Lab 09/16/18 1203  TROPONINI <0.03   No results for input(s): TROPIPOC in the last 168 hours.   BNPNo results for input(s): BNP, PROBNP in the last 168 hours.   DDimer No results for input(s): DDIMER in the last 168 hours.   Radiology    No results found.  Cardiac Studies   09/10/2018 TTE Study Conclusions - Left ventricle: The cavity size was normal. There was mild   concentric hypertrophy. Systolic function was normal. The   estimated ejection fraction was in the range of 55% to 60%. Wall   motion was normal; there were no regional wall motion   abnormalities. Doppler parameters are consistent with abnormal   left ventricular relaxation (grade 1 diastolic dysfunction). - Aortic valve: There was  trivial regurgitation. - Left atrium: The atrium was normal in size. - Right ventricle: Systolic function was normal. - Pulmonary arteries: Systolic pressure could not be accurately   estimated.   Patient Profile     85 y.o. male with a hx of CAD s/p LAD stenting 2005 PCI-OM2 2012, PAF (Xarelto), PE 2006 in [place], PE following catheterization 2012, atypical chest pain, CKD III, cold agglutinin hemolytic anemia, prostate CA, orthostatic hypotension, HTN, HLDwho is being seen for the evaluation ofpre-syncope sx.  Assessment & Plan    1. ORTHOSTATIC HYPOTENSION:   Pending today's orthostatics. On maximal dose midodrine and small dose Florinef. Per Dr. Saunders Revel, not recommended to increase Florinef given elevated blood pressure in the lying position.  Continue to use an abdominal binder.   He is reporting improvement although he continues to be significantly symptomatic while orthostatic. Consider Northera as outpatient  2. PAF:   On amiodarone.  Xarelto held because of black tarry stool and can be restarted as s/p endoscopy without signs of GI bleed or post-procedure bleeding. Recommend restart Xarelto given PAF. Beta blocker and cardizem held with HR currently controlled on amiodarone. Note that blood pressure elevated while in the supine position.  Pending orthostatics with recommendation to continue to hold BB/cardizem d/t hypotension/pre-renal injury as below. He seems to be in sinus rhythm by exam but not on telemetry. Recommend restart telemetry as patient not yet discharged at this time.  3. CKD :  Conservative medical management given his hypotension and in order to prevent pre-renal injury - still pending today's orthostatics.  Creat is stable at 1.05.     4. QUESTIONABLE GI BLEED:   The patient was seen and underwent endoscopy by GI. Acceptable to restart Xarelto as above. Continue PPI. Patient does have known cold agglutinin hemolytic anemia and is followed by hematology. Recommend continued  follow-up with them.   5. HISTORY of CAD s/p PCI: Continue medical management with Ranexa and nitro. No ischemic workup this admission. Recommend restart of statin medication to continue aggressive risk factor prevention. Last LDL 64 2017  For questions or updates, please contact [facility] Please consult [email or web address] for contact info under Cardiology/STEMI.   Signed, Robin Searing  Mickle Plumb, PA-C  09/23/2018, 11:13 AM

## 2018-09-23 NOTE — Care Management Note (Signed)
Case Management Note  Patient Details  Name: William Taylor MRN: [id number] Date of Birth: Dec 19, 1929  Subjective/Objective:     Patient and wife chose [facility] for [facility] services.  Tommi Rumps with [facility] accepted for [facility] RN, PT.  Aware of patient discharge today.  They have access to a walker.   Explained importance of using walker especially when getting up from laying/seated position.               Action/Plan:   Expected Discharge Date:  09/23/18               Expected Discharge Plan:  [place]  In-House Referral:     Discharge planning Services  CM Consult  Post Acute Care Choice:  Home Health Choice offered to:  Patient, Spouse  DME Arranged:    DME Agency:     HH Arranged:  Patient Refused HH, RN, PT [place] Agency:     Status of Service:  Completed, signed off  If discussed at [employer] of Stay Meetings, dates discussed:    Additional Comments:  Elza Rafter, RN 09/23/2018, 12:11 PM

## 2018-09-23 NOTE — Care Management Note (Signed)
Case Management Note  Patient Details  Name: William Taylor MRN: [id number] Date of Birth: 04-Aug-1930  Subjective/Objective:   Provided patient and spouse medicare.gov list of [place] agencies.                 Action/Plan:   Expected Discharge Date:                  Expected Discharge Plan:  [place]  In-House Referral:     Discharge planning Services  CM Consult  Post Acute Care Choice:  Home Health Choice offered to:  Patient, Spouse  DME Arranged:    DME Agency:     HH Arranged:  Patient Refused HH, RN, PT [place] Agency:     Status of Service:  Completed, signed off  If discussed at [place] of Stay Meetings, dates discussed:    Additional Comments:  Elza Rafter, RN 09/23/2018, 10:35 AM

## 2018-09-24 ENCOUNTER — Telehealth: Payer: Self-pay | Admitting: Family Medicine

## 2018-09-24 ENCOUNTER — Other Ambulatory Visit: Payer: Medicare HMO

## 2018-09-24 NOTE — Telephone Encounter (Signed)
Transition Care Management Follow-up Telephone Call  How have you been since you were released from the hospital? Patient wife says he believes he is feeling better.   Do you understand why you were in the hospital? yes   Do you understand the discharge instrcutions? yes  Items Reviewed:  Medications reviewed: yes  Allergies reviewed: yes  Dietary changes reviewed: yes  Referrals reviewed: yes   Functional Questionnaire:   Activities of Daily Living (ADLs):   He states they are independent in the following: bathing and hygiene, feeding, continence, grooming, toileting and dressing States they require assistance with the following: ambulation   Any transportation issues/concerns?: no   Any patient concerns? no   Confirmed importance and date/time of follow-up visits scheduled: yes   Confirmed with patient if condition begins to worsen call PCP or go to the ER.  Patient was given the Call-a-Nurse line [phone]: yes

## 2018-09-25 DIAGNOSIS — I951 Orthostatic hypotension: Secondary | ICD-10-CM | POA: Diagnosis not present

## 2018-09-25 DIAGNOSIS — I2510 Atherosclerotic heart disease of native coronary artery without angina pectoris: Secondary | ICD-10-CM | POA: Diagnosis not present

## 2018-09-25 DIAGNOSIS — I129 Hypertensive chronic kidney disease with stage 1 through stage 4 chronic kidney disease, or unspecified chronic kidney disease: Secondary | ICD-10-CM | POA: Diagnosis not present

## 2018-09-25 DIAGNOSIS — I252 Old myocardial infarction: Secondary | ICD-10-CM | POA: Diagnosis not present

## 2018-09-25 DIAGNOSIS — N183 Chronic kidney disease, stage 3 (moderate): Secondary | ICD-10-CM | POA: Diagnosis not present

## 2018-09-28 ENCOUNTER — Other Ambulatory Visit: Payer: Self-pay | Admitting: Family Medicine

## 2018-09-28 ENCOUNTER — Telehealth: Payer: Self-pay | Admitting: Family Medicine

## 2018-09-28 DIAGNOSIS — I2510 Atherosclerotic heart disease of native coronary artery without angina pectoris: Secondary | ICD-10-CM | POA: Diagnosis not present

## 2018-09-28 DIAGNOSIS — I252 Old myocardial infarction: Secondary | ICD-10-CM | POA: Diagnosis not present

## 2018-09-28 DIAGNOSIS — N183 Chronic kidney disease, stage 3 (moderate): Secondary | ICD-10-CM | POA: Diagnosis not present

## 2018-09-28 DIAGNOSIS — I129 Hypertensive chronic kidney disease with stage 1 through stage 4 chronic kidney disease, or unspecified chronic kidney disease: Secondary | ICD-10-CM | POA: Diagnosis not present

## 2018-09-28 DIAGNOSIS — I951 Orthostatic hypotension: Secondary | ICD-10-CM | POA: Diagnosis not present

## 2018-09-28 NOTE — Telephone Encounter (Signed)
Noted  

## 2018-09-28 NOTE — Telephone Encounter (Signed)
Copied from [place] [phone]. Topic: Quick Communication - Home Health Verbal Orders >> Sep 28, 2018  3:47 PM Berneta Levins wrote: Caller/Agency: Jeneen Montgomery with [facility] Number: [phone], OK to leave a message Requesting OT/PT/Skilled Nursing/Social Work: Skilled Nursing Frequency: 1 week 1, 2 week 2.  Pt declined Environmental consultant and [place]

## 2018-09-28 NOTE — Telephone Encounter (Signed)
Copied from [place] [phone]. Topic: Quick Communication - See Telephone Encounter >> Sep 28, 2018  4:08 PM Antonieta Iba C wrote: CRM for notification. See Telephone encounter for: 09/28/18.  Debbie - Education officer, museum w/ [facility] called in to make provider aware that pt and spouse has declined Education officer, museum services at this time.   Phone: [phone] in case she is needed by PCP.

## 2018-09-28 NOTE — Telephone Encounter (Signed)
Verbal given per Dr Caryl Bis

## 2018-09-29 ENCOUNTER — Telehealth: Payer: Self-pay | Admitting: Family Medicine

## 2018-09-29 NOTE — Telephone Encounter (Signed)
Verbal orders given  

## 2018-09-29 NOTE — Telephone Encounter (Signed)
Copied from [place] [phone]. Topic: Quick Communication - Home Health Verbal Orders >> Sep 29, 2018  8:21 AM Lennox Solders wrote: Caller/Agency:[facility] Callback Number:[phone] Requesting  PT 2x2, 1x2

## 2018-09-30 ENCOUNTER — Encounter: Payer: Self-pay | Admitting: Family Medicine

## 2018-09-30 ENCOUNTER — Ambulatory Visit: Payer: Medicare HMO | Admitting: Family Medicine

## 2018-09-30 VITALS — BP 150/90 | HR 76 | Temp 97.7°F | Ht 76.0 in | Wt 205.4 lb

## 2018-09-30 DIAGNOSIS — I951 Orthostatic hypotension: Secondary | ICD-10-CM | POA: Diagnosis not present

## 2018-09-30 DIAGNOSIS — N179 Acute kidney failure, unspecified: Secondary | ICD-10-CM

## 2018-09-30 DIAGNOSIS — E440 Moderate protein-calorie malnutrition: Secondary | ICD-10-CM | POA: Diagnosis not present

## 2018-09-30 DIAGNOSIS — K921 Melena: Secondary | ICD-10-CM | POA: Diagnosis not present

## 2018-09-30 LAB — CBC
HCT: 33.7 % — ABNORMAL LOW (ref 39.0–52.0)
Hemoglobin: 11.5 g/dL — ABNORMAL LOW (ref 13.0–17.0)
MCHC: 34.1 g/dL (ref 30.0–36.0)
MCV: 102.5 fl — ABNORMAL HIGH (ref 78.0–100.0)
Platelets: 292 10*3/uL (ref 150.0–400.0)
RBC: 3.29 Mil/uL — AB (ref 4.22–5.81)
RDW: 13.8 % (ref 11.5–15.5)
WBC: 7.3 10*3/uL (ref 4.0–10.5)

## 2018-09-30 LAB — BASIC METABOLIC PANEL
BUN: 17 mg/dL (ref 6–23)
CO2: 28 mEq/L (ref 19–32)
Calcium: 9.5 mg/dL (ref 8.4–10.5)
Chloride: 100 mEq/L (ref 96–112)
Creatinine, Ser: 1.23 mg/dL (ref 0.40–1.50)
GFR: 55.69 mL/min — ABNORMAL LOW (ref >60.00)
Glucose, Bld: 111 mg/dL — ABNORMAL HIGH (ref 70–99)
POTASSIUM: 3.4 meq/L — AB (ref 3.5–5.1)
Sodium: 137 mEq/L (ref 135–145)

## 2018-09-30 NOTE — Patient Instructions (Signed)
Nice to see you. We will get lab work today and contact you with the results.  We will try to get your cardiology appointment moved up and get you scheduled with GI.  If you have worsening symptoms, have syncope, chest pain, shortness of breath, or any new or changing symptoms please seek medical attention.

## 2018-09-30 NOTE — Assessment & Plan Note (Signed)
Patient continues to have symptoms consistent with orthostasis.  He has had no syncope.  Some of this could be related to dehydration.  We will check labs today.  We will try to get his cardiology appointment moved up.  He will continue current medications as prescribed on discharge from the hospital for this issue.

## 2018-09-30 NOTE — Assessment & Plan Note (Signed)
I encouraged adequate caloric intake.  I encouraged high-calorie food with Ensure and ice cream.  I encouraged him to eat essentially what ever he would like so that he gets adequate intake.

## 2018-09-30 NOTE — Assessment & Plan Note (Signed)
No recurrent bleeding or melena.  He will continue Xarelto.  He will have a CBC checked.  We will arrange for GI follow-up.  Given return precautions.
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
## 2018-10-01 ENCOUNTER — Other Ambulatory Visit: Payer: Self-pay | Admitting: Family Medicine

## 2018-10-01 DIAGNOSIS — E876 Hypokalemia: Secondary | ICD-10-CM

## 2018-10-01 DIAGNOSIS — I951 Orthostatic hypotension: Secondary | ICD-10-CM | POA: Diagnosis not present

## 2018-10-01 DIAGNOSIS — I129 Hypertensive chronic kidney disease with stage 1 through stage 4 chronic kidney disease, or unspecified chronic kidney disease: Secondary | ICD-10-CM | POA: Diagnosis not present

## 2018-10-01 DIAGNOSIS — N183 Chronic kidney disease, stage 3 (moderate): Secondary | ICD-10-CM | POA: Diagnosis not present

## 2018-10-01 DIAGNOSIS — I2510 Atherosclerotic heart disease of native coronary artery without angina pectoris: Secondary | ICD-10-CM | POA: Diagnosis not present

## 2018-10-01 DIAGNOSIS — I252 Old myocardial infarction: Secondary | ICD-10-CM | POA: Diagnosis not present

## 2018-10-02 DIAGNOSIS — I129 Hypertensive chronic kidney disease with stage 1 through stage 4 chronic kidney disease, or unspecified chronic kidney disease: Secondary | ICD-10-CM | POA: Diagnosis not present

## 2018-10-02 DIAGNOSIS — I951 Orthostatic hypotension: Secondary | ICD-10-CM | POA: Diagnosis not present

## 2018-10-02 DIAGNOSIS — I2510 Atherosclerotic heart disease of native coronary artery without angina pectoris: Secondary | ICD-10-CM | POA: Diagnosis not present

## 2018-10-02 DIAGNOSIS — I252 Old myocardial infarction: Secondary | ICD-10-CM | POA: Diagnosis not present

## 2018-10-02 DIAGNOSIS — N183 Chronic kidney disease, stage 3 (moderate): Secondary | ICD-10-CM | POA: Diagnosis not present

## 2018-10-05 ENCOUNTER — Ambulatory Visit: Payer: Medicare HMO | Admitting: Cardiovascular Disease

## 2018-10-05 ENCOUNTER — Encounter: Payer: Self-pay | Admitting: Cardiovascular Disease

## 2018-10-05 VITALS — BP 125/82 | HR 81 | Ht 76.0 in | Wt 202.0 lb

## 2018-10-05 DIAGNOSIS — I25118 Atherosclerotic heart disease of native coronary artery with other forms of angina pectoris: Secondary | ICD-10-CM | POA: Diagnosis not present

## 2018-10-05 DIAGNOSIS — N183 Chronic kidney disease, stage 3 unspecified: Secondary | ICD-10-CM

## 2018-10-05 DIAGNOSIS — R0602 Shortness of breath: Secondary | ICD-10-CM | POA: Diagnosis not present

## 2018-10-05 DIAGNOSIS — I951 Orthostatic hypotension: Secondary | ICD-10-CM

## 2018-10-05 DIAGNOSIS — I480 Paroxysmal atrial fibrillation: Secondary | ICD-10-CM | POA: Diagnosis not present

## 2018-10-05 MED ORDER — DROXIDOPA 100 MG PO CAPS: ORAL_CAPSULE | ORAL | Status: DC

## 2018-10-05 NOTE — Progress Notes (Signed)
Cardiology Office Note  Date:  10/05/2018   ID:  William Taylor, DOB 1930-02-15, MRN [id number]  PCP:  [name], MD   Chief Complaint  Patient presents with  . OTHER    F/u echo/Orthostatic Hypotension c/o Hospital feels worse "headaches, dizzy and unable to eat. Meds reviewed verbally with pt.    HPI:  [name] is a very pleasant 85 year old gentleman with a history of  coronary artery disease, stent in 2005,  atrial fibrillation with RVR in the setting of UTI September 2011,  stent placement May 2012 to the OM 2,  PE following catheterization started on anticoagulation, atrial fibrillation in 2013,  Last atrial fibrillation July 2018  atypical chest pain July 2018 History of anemia who presents for follow-up of his coronary disease and atrial fibrillation   INTERVAL HISTORY: The patient reports today for follow up after a recent hospital visit, 09/16/2018-09/23/2018.   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 Marius Ditch September 22, 2018  He had reported chronic nausea, antiemetics used as needed Symptoms seem to improve with better blood pressure control   the patient is accompanied by his wife today. He continues to notice drops in his blood pressure. He adds walking with a walker when he will notice a drop in his pressure. Associated symptoms include nausea, dizziness, and abdominal pain. He has noticed an increase in his heart rate after walking a round.  He has to sit in the shower, has to sit to shave secondary to  orthostasis symptoms He has had dramatic weight loss 20 pounds in the past 2 months, does not feel well, no appetite  Per his wife, the patient has been mainly in bed since the beginning of this year. She also reports giving him ensure with vanilla ice cream.   He has a blood pressure cuff at home, adding he check it every morning, while sitting.   Today's Blood pressure 125/82   HR BP SYMPTOMS  Lying 78  151/96   Sitting 80  149/92   Standing (71min) 84 110/76 dizzy  Standing (46min) 97 90/60 dizzy   Stress Test done 09/19/2018: pharmacological myocardial perfusion imaging study with no significant  ischemia. Normal wall motion, EF estimated at 69%. No EKG changes concerning for ischemia at peak stress or in recovery. Low risk scan.   EKG personally reviewed by myself on todays visit Shows sinus rhythm with sinus arrhythmia with 1st degree AV block 81 bpm. LAD   OTHER PAST MEDICAL HISTORY REVIEWED BY ME FOR TODAY'S VISIT:   hospitalization February 2020 dehydration, orthostatic hypotension Anorexia, melena   hospitalization for atrial fibrillation 02/2017 had orthostatic hypotension, chest pain developed palpitations Friday, July 20,  He called our office, was given the choice of emergency room for cardioversion as he has been anticoagulated or outpatient treatment with oral amiodarone. He decided to go to the emergency room Chest pain was felt to be atypical, no testing performed Emergency notes reviewed and they detail chest pain when sitting up  he reports having chest pain when he rolled over in  bed this morning  history of anemia hematocrit down to 28 EGD, colonoscopy, video capsule enteroscopy  Stool was dark from iron which she takes once a day HCT 28 to 34   previous  injury of his left shoulder, rotator cuff tear   previous episode ofchest pain, went to the emergency room November 2016. Ruled out for MI, had Myoview showing no ischemia. He was noted to have atrial  tachycardia with rates up to the 150 range, metoprolol tartrate increased up to 50 mg twice a day. On the higher dose metoprolol, he has not had any further episodes of tachycardia since discharge. Previously he did note heart rate up to 190 going down to 150, measured with a pulse oximeter. None recently  catheterization in 2009 showing patent stent with no other significant disease, normal systolic function in September 2011 who presented to the emergency room September 2011 with malaise, atrial fibrillation with RVR in the setting of a urinary tract infection, recent admission to the hospital November 14 2010 for atrial fibrillation that developed after pushing mowing with some chest pain. He converted back to normal sinus rhythm, Presenting to [facility] with chest discomfort with cardiac catheterization Dec 25 2010, showing 90% OM 2 disease, transferred to [facility] with DES stent placed, 2.25 x 16 mm POMUS stent, with flank pain developing after discharge, admitted to [facility] and diagnosed with pulmonary embolism with heavy clot burden.   Found to be in atrial fibrillation in 2013. He had symptoms of shortness of breath with exertion.  After one month on xarelto, amiodarone load and titration downward was started with conversion to NSR in October 2013  EKG the end of October 2013 confirmed normal sinus rhythm.   History of back surgery in 2008, history of prostate cancer followed by Dr. Jacqlyn Larsen   He was admitted to the hospital 10/27/2013 with chest pain. He had held his Plavix for colonoscopy 10/26/2013 with polypectomy x3. Cardiac catheterization performed given his severe chest pain and no disease showing severe small vessel disease, patent stents Amiodarone was held for bradycardia, he was started on isosorbide 30 mg daily, Plavix was held, he was continued on aspirin and xarelto.  Cardiac catheterization results; 90% disease at the ostium of a small diagonal vessel, small OM vessel with 80% disease  in the proximal region Otherwise stents are patent  Echocardiogram 10/28/2013 shows normal LV function, mildly dilated left atrium, mild aortic regurg, normal right ventricular systolic pressures   PMH:   has a past medical history of Cancer ([place]), Colon polyp, Coronary artery disease, Hyperlipidemia, Hypertension, MI (myocardial infarction) ([place]), PAF (paroxysmal atrial fibrillation) ([place]), and Skin cancer.  PSH:    Past Surgical History:  Procedure Laterality Date  . CARDIAC CATHETERIZATION  10/2013   [facility]  . CATARACT EXTRACTION  Oct. 3, 2012   right eye  . colonoscopy    . COLONOSCOPY W/ POLYPECTOMY  2015   Dr Rayann Heman  . COLONOSCOPY WITH PROPOFOL N/A 08/15/2016   Procedure: COLONOSCOPY WITH PROPOFOL;  Surgeon: [name], MD;  [facility] ENDOSCOPY;  Service: Endoscopy;  Laterality: N/A;  . CORONARY ANGIOPLASTY  2009   2005; s/p stent  . ENTEROSCOPY N/A 09/22/2018   Procedure: ENTEROSCOPY;  Surgeon: Lin Landsman, MD;  Location: [place];  Service: Gastroenterology;  Laterality: N/A;  . ESOPHAGOGASTRODUODENOSCOPY (EGD) WITH PROPOFOL N/A 08/15/2016   Procedure: ESOPHAGOGASTRODUODENOSCOPY (EGD) WITH PROPOFOL;  Surgeon: [name], MD;  Location: [facility] ENDOSCOPY;  Service: Endoscopy;  Laterality: N/A;  . GIVENS CAPSULE  STUDY N/A 09/26/2016   Procedure: GIVENS CAPSULE STUDY;  Surgeon: [name], MD;  Location: [facility] ENDOSCOPY;  Service: Endoscopy;  Laterality: N/A;  . HERNIA REPAIR  2013  . LUMBAR SPINE SURGERY    . UPPER GI ENDOSCOPY  Sept 2015   Dr Rayann Heman    Current Outpatient Medications  Medication Sig Dispense Refill  . amiodarone (PACERONE) 200 MG tablet Take 1 tablet (200 mg total) by mouth daily. 90 tablet 0  . cyproheptadine (PERIACTIN) 4 MG tablet Take 1 tablet (4 mg total) by mouth 3 (three) times daily as needed for allergies. 90 tablet 3  . feeding supplement, ENSURE ENLIVE, (ENSURE ENLIVE) LIQD Take 237 mLs by mouth daily. 90 Bottle 0  . fenofibrate  micronized (ANTARA) 130 MG capsule TAKE 1 CAPSULE BY MOUTH  DAILY BEFORE BREAKFAST 90 capsule 1  . finasteride (PROSCAR) 5 MG tablet Take 1 tablet (5 mg total) by mouth daily. 90 tablet 2  . fludrocortisone (FLORINEF) 0.1 MG tablet Take 1 tablet (0.1 mg total) by mouth daily. 30 tablet 0  . levobunolol (BETAGAN) 0.5 % ophthalmic solution Place 1 drop into the right eye 2 (two) times daily.     Marland Kitchen levothyroxine (SYNTHROID, LEVOTHROID) 50 MCG tablet Take 1 tablet (50 mcg total) by mouth daily at 6 (six) AM. 90 tablet 0  . midodrine (PROAMATINE) 10 MG tablet Take 1 tablet (10 mg total) by mouth 3 (three) times daily with meals. 90 tablet 0  . nitroGLYCERIN (NITROSTAT) 0.4 MG SL tablet DISSOLVE UNDER THE TONGUE 1 TABLET EVERY 5 MINUTES AS NEEDED FOR CHEST PAIN 25 tablet 3  . Omega-3 Fatty Acids (FISH OIL) 1000 MG CAPS Take 1,000 mg by mouth daily.    . ondansetron (ZOFRAN-ODT) 8 MG disintegrating tablet Take 0.5 tablets (4 mg total) by mouth every 8 (eight) hours as needed for nausea. 20 tablet 1  . pantoprazole (PROTONIX) 40 MG tablet Take 1 tablet (40 mg total) by mouth daily. 30 tablet 0  . polyethylene glycol powder (GLYCOLAX/MIRALAX) powder Take 17 g by mouth 2 (two) times daily as needed. 3350 g 1  . pravastatin (PRAVACHOL) 40 MG tablet TAKE 1 TABLET BY MOUTH AT  BEDTIME 90 tablet 3  . ranolazine (RANEXA) 500 MG 12 hr tablet Take 1 tablet (500 mg total) by mouth 2 (two) times daily. 60 tablet 3  . tamsulosin (FLOMAX) 0.4 MG CAPS capsule Take 0.4 mg by mouth as needed.    Alveda Reasons 20 MG TABS tablet TAKE 1 TABLET DAILY 90 tablet 1   No current facility-administered medications for this visit.      Allergies:   Bee venom; Sulfasalazine; Sulfa antibiotics; and Warfarin and related   Social History:  The patient  reports that he has never smoked. He has never used smokeless tobacco. He reports that he does not drink alcohol or use drugs.   Family History:   family history includes Colon cancer  in his father; Coronary artery disease in his brother; Other (age of onset: 66) in his brother; Stroke in his mother.    Review of Systems: Review of Systems  Constitutional: Negative.   Eyes: Negative.   Respiratory: Negative.   Cardiovascular: Negative.   Gastrointestinal: Positive for abdominal pain and nausea.  Genitourinary: Negative.   Musculoskeletal: Negative.   Neurological: Positive for dizziness.  Psychiatric/Behavioral: Negative.   All other systems reviewed and are negative.    PHYSICAL EXAM: VS:  BP 125/82 (BP Location: Right Arm, Patient Position: Sitting, Cuff  Size: Normal)   Pulse 81   Ht 6\' 4"  (1.93 m)   Wt 202 lb (91.6 kg)   BMI 24.59 kg/m  , BMI Body mass index is 24.59 kg/m.   Constitutional: Appears very thin, muscle wasting legs back face HENT: normal Head: Grossly normal Eyes:  no discharge. No scleral icterus.  Neck: No JVD, no carotid bruits  Cardiovascular: Regular rate and rhythm, no murmurs appreciated Pulmonary/Chest: Clear to auscultation bilaterally, no wheezes or rales Abdominal: Soft.  no distension.  no tenderness.  Musculoskeletal: Normal range of motion, muscle atrophy Neurological:  normal muscle tone. Coordination normal.  Skin: Skin warm and dry Psychiatric: normal affect, pleasant   Recent Labs: 08/27/2018: ALT 15 09/16/2018: TSH 4.984 09/23/2018: Magnesium 1.7 09/30/2018: BUN 17; Creatinine, Ser 1.23; Hemoglobin 11.5; Platelets 292.0; Potassium 3.4; Sodium 137    Lipid Panel Lab Results  Component Value Date   CHOL 110 06/28/2016   HDL 29.90 (L) 06/28/2016   LDLCALC 64 06/28/2016   TRIG 80.0 06/28/2016      Wt Readings from Last 3 Encounters:  10/05/18 202 lb (91.6 kg)  09/30/18 205 lb 6.4 oz (93.2 kg)  09/21/18 204 lb 4.8 oz (92.7 kg)       ASSESSMENT AND PLAN:  Severe orthostasis Debilitating, likely with associated nausea Dramatic weight loss, becoming more bedbound Not responding well to midodrine or  Florinef We have had a long discussion with him today concerning various treatment options We will start him on Northera 100 mg 3 times daily Every 48 hours we will increase the dose by 100 mg up to peak dose 600 3 times daily -He will call us every day every other day with blood pressure measurements Strongly encouraged increased calorie intake.  Discussed various strategies with him  Mixed hyperlipidemia  Plan: EKG 12-Lead Cholesterol is at goal on the current lipid regimen. No changes to the medications were made.  Stable Cautioned against further weight loss  Coronary artery disease involving native coronary artery of native heart without angina pectoris Plan: EKG 12-Lead Denies chest pain or shortness of breath No further testing at this time  Atrial fibrillation, unspecified type ([place]) Plan: EKG 12-Lead  on Xarelto Metoprolol and diltiazem held secondary to orthostasis Continue amiodarone  Congestive heart failure, unspecified congestive heart failure chronicity, unspecified congestive heart failure type (HCC)  Euvolemic, no medication changes  Chronic kidney disease (CKD), stage III (moderate) Plan: recommended he avoid NSAIDs Stable  Iron deficiency anemia, unspecified iron deficiency anemia type Plan: Stable Appetite poor, dramatic weight loss Recommend he work on his diet  Orthostatic hypotension Plan: Recommend starting Rx Northera 100 mg three times a day  Titration upwards as detailed above  Long discussion with him concerning new medication as detailed above Detailed instructions provided, samples, Recommended close monitoring of blood pressure Long discussion concerning ways to increase his calorie intake Total encounter time more than 45 minutes Greater than 50% was spent in counseling and coordination of care with the patient  Disposition:   F/U 1 week  No orders of the defined types were placed in this encounter.  I, Margit Banda am acting as a  scribe for Ida Rogue, M.D., Ph.D.  I have reviewed the above documentation for accuracy and completeness, and I agree with the above.   Signed, Esmond Plants, M.D., Ph.D. 10/05/2018  [place], [place]

## 2018-10-05 NOTE — Patient Instructions (Addendum)
Medication Instructions:  Your physician has recommended you make the following change in your medication:   1)  START Northera 100 mg capsules three times as day as below-  (when you get up in the morning, at midday, and in the late afternoon- at least 3 hours before bed)   DAYS 1 & 2: take 1 capsule (100 mg) three times a day  DAYS 3 & 4: take 2 capsules (200 mg) three times a day  DAYS 5 & 6: take 3 capsules (300 mg) three times a day  DAYS 7 & 8: take 4 capsules (400 mg) three times a day  Samples given: Northera 100 mg Lot: [id number] Exp: 07/22 # 3 bottles   If you need a refill on your cardiac medications before your next appointment, please call your pharmacy.    Lab work: No new labs needed   If you have labs (blood work) drawn today and your tests are completely normal, you will receive your results only by:  [facility] [place] (if you have MyChart) OR  A paper copy in the mail If you have any lab test that is abnormal or we need to change your treatment, we will call you to review the results.   Testing/Procedures: No new testing needed   Follow-Up: At [facility], you and your health needs are our priority.  As part of our continuing mission to provide you with exceptional heart care, we have created designated Provider Care Teams.  These Care Teams include your primary Cardiologist (physician) and Advanced Practice Providers (APPs -  Physician Assistants and Nurse Practitioners) who all work together to provide you with the care you need, when you need it.   You will need a follow up appointment in 1 week with Dr. Rockey Situ: Monday 10/12/18 at 2:20 pm   Any Other Special Instructions Will Be Listed Below (If Applicable).  - Call the office once a day if possible with you blood pressures and how you are feeling over the next week  For educational health videos Log in to : [email or web address] Or : [email or web address], password : [email or web address]

## 2018-10-06 ENCOUNTER — Telehealth: Payer: Self-pay | Admitting: Cardiovascular Disease

## 2018-10-06 DIAGNOSIS — I2510 Atherosclerotic heart disease of native coronary artery without angina pectoris: Secondary | ICD-10-CM | POA: Diagnosis not present

## 2018-10-06 DIAGNOSIS — N183 Chronic kidney disease, stage 3 (moderate): Secondary | ICD-10-CM | POA: Diagnosis not present

## 2018-10-06 DIAGNOSIS — I951 Orthostatic hypotension: Secondary | ICD-10-CM | POA: Diagnosis not present

## 2018-10-06 DIAGNOSIS — I252 Old myocardial infarction: Secondary | ICD-10-CM | POA: Diagnosis not present

## 2018-10-06 DIAGNOSIS — I129 Hypertensive chronic kidney disease with stage 1 through stage 4 chronic kidney disease, or unspecified chronic kidney disease: Secondary | ICD-10-CM | POA: Diagnosis not present

## 2018-10-06 NOTE — Telephone Encounter (Signed)
Spoke with Willey Blade home nurse. SHe wanted to let us know patient started Northera today. This afternoon's BP's/HR - Sitting 110/66,  60  Standing 90/60,   60 No dizziness. Nurse says this is better than before. She would like to know if Dr Rockey Situ will ok 3 more weeks of 1 nurse visit a week.  Advised that should be fine and will make sure with Dr Rockey Situ.

## 2018-10-06 NOTE — Telephone Encounter (Signed)
Nurse with [facility] is calling to report BP readings, and to request more home visits.  2/18/-110/66 sitting, 90/60 standing

## 2018-10-07 ENCOUNTER — Ambulatory Visit: Payer: Medicare HMO | Admitting: Gastroenterology

## 2018-10-07 ENCOUNTER — Encounter: Payer: Self-pay | Admitting: Gastroenterology

## 2018-10-07 ENCOUNTER — Telehealth: Payer: Self-pay | Admitting: Cardiovascular Disease

## 2018-10-07 VITALS — BP 93/63 | HR 85 | Ht 78.0 in | Wt 204.0 lb

## 2018-10-07 DIAGNOSIS — K921 Melena: Secondary | ICD-10-CM | POA: Diagnosis not present

## 2018-10-07 NOTE — Telephone Encounter (Signed)
Dr. [name] wanted them to call and make [place] aware of his orthostatic blood pressures. Debbie reports they were sitting 110/73 HR 85 and standing was 93/63 HR 87. Reviewed that we are aware of his orthostatic readings and he is being treated with new medication for this and home health is also aware. She will make Dr. [name] aware and had no further questions.

## 2018-10-07 NOTE — Telephone Encounter (Signed)
Spoke with Kathrynn Speed and reviewed that [name] was recently started and that as it increases then we review for decreases in other meds. She obtained verbal order to see him 2 times a week for 2 weeks and then reassess. She will keep [place] updated on his blood pressure readings and any further concerns. She was appreciative for the call back with no further questions at this time.

## 2018-10-07 NOTE — Telephone Encounter (Signed)
Good news,  Would keep going up with titration of northera Would continue nursing home visits Continue to call with numbers please

## 2018-10-07 NOTE — Progress Notes (Signed)
[name] MD, MRCP(U.K) [address]  [place]  [place], [place] [address]  Main: [phone]  Fax: [phone]   Primary Care Physician: [name], MD  Primary Gastroenterologist:  Dr. [name]   Chief Complaint  Patient presents with  . Follow-up    chest pain, IDA. Pt c/o stomach issues; c/o dizziness, light headed, nausea, usually after taking medications 30 min. before meals and sometimes before taking medication.    HPI: William Taylor is a 85 y.o. male  Summary of history : He has a history of CAD,stent placment in 2005 , Atrial fibrillation 2011, On anticoagulation . Atavisit to see his cardiologist Dr Rockey Situ , reported symptoms of shortness of breath, fatigue ,exertion . He was noted to have a drop in his Hb , stool was positive for blood . He wason xarelto. He has external hemorroids. 06/2016 Ferritin 9 , TIBC elevated. 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 10/03/16- capsule study showedno bleeding but a few small polyps were seen . 09/2017 : iron studies were normal .11/2017 b12 and folate normal. Seen Dr Janese Banks Hematology - diagnosed with cold agglutinin hemolytic anemia   Interval history 09/08/2018-10/07/2018   Admitted 09/20/2018 with GI bleed , postural hypotension,melena .  09/22/2018 : Push enteroscopy -1 cm hiatal hernia.  09/30/2018: Hb 11.5 grams  Noted to have orthostatic drop in BP with symptoms of dizziness.   We called Dr Gwenyth Ober office to inform the low BP and symptomatic association. Feels sick in the morning when he takes his pills. No pain but has nausea. Also is light headed and dizzy at the same time. No pain when he eats.   BP 93/63 (BP Location: Left Arm, Patient Position: Standing, Cuff Size: Large)   Pulse 85   Ht 6\' 6"   (1.981 m)   Wt 204 lb (92.5 kg)   BMI 23.57 kg/m     = Current Outpatient Medications  Medication Sig Dispense Refill  . amiodarone (PACERONE) 200 MG tablet Take 1 tablet (200 mg total) by mouth daily. 90 tablet 0  . cyproheptadine (PERIACTIN) 4 MG tablet Take 1 tablet (4 mg total) by mouth 3 (three) times daily as needed for allergies. 90 tablet 3  . Droxidopa (NORTHERA) 100 MG CAPS Take 1 capsule (100 mg) by mouth three times a day as directed    . feeding supplement, ENSURE ENLIVE, (ENSURE ENLIVE) LIQD Take 237 mLs by mouth daily. 90 Bottle 0  . fenofibrate micronized (ANTARA) 130 MG capsule TAKE 1 CAPSULE BY MOUTH  DAILY BEFORE BREAKFAST 90 capsule 1  . finasteride (PROSCAR) 5 MG tablet Take 1 tablet (5 mg total) by mouth daily. 90 tablet 2  . fludrocortisone (FLORINEF) 0.1 MG tablet Take 1 tablet (0.1 mg total) by mouth daily. 30 tablet 0  . levobunolol (BETAGAN) 0.5 % ophthalmic solution Place 1 drop into the right eye 2 (two) times daily.     Marland Kitchen levothyroxine (SYNTHROID, LEVOTHROID) 50 MCG tablet Take 1 tablet (50 mcg total) by mouth daily at 6 (six) AM. 90 tablet 0  . midodrine (PROAMATINE) 10 MG tablet Take 1 tablet (10 mg total) by mouth 3 (three) times daily with meals. 90 tablet 0  . nitroGLYCERIN (NITROSTAT) 0.4 MG SL tablet DISSOLVE UNDER THE TONGUE 1 TABLET EVERY 5 MINUTES AS NEEDED FOR CHEST PAIN 25  tablet 3  . Omega-3 Fatty Acids (FISH OIL) 1000 MG CAPS Take 1,000 mg by mouth daily.    . ondansetron (ZOFRAN-ODT) 8 MG disintegrating tablet Take 0.5 tablets (4 mg total) by mouth every 8 (eight) hours as needed for nausea. 20 tablet 1  . pantoprazole (PROTONIX) 40 MG tablet Take 1 tablet (40 mg total) by mouth daily. 30 tablet 0  . polyethylene glycol powder (GLYCOLAX/MIRALAX) powder Take 17 g by mouth 2 (two) times daily as needed. 3350 g 1  . pravastatin (PRAVACHOL) 40 MG tablet TAKE 1 TABLET BY MOUTH AT  BEDTIME 90 tablet 3  . ranolazine (RANEXA) 500 MG 12 hr tablet Take 1  tablet (500 mg total) by mouth 2 (two) times daily. 60 tablet 3  . tamsulosin (FLOMAX) 0.4 MG CAPS capsule Take 0.4 mg by mouth as needed.    Alveda Reasons 20 MG TABS tablet TAKE 1 TABLET DAILY 90 tablet 1   No current facility-administered medications for this visit.     Allergies as of 10/07/2018 - Review Complete 10/07/2018  Allergen Reaction Noted  . Bee venom Swelling 07/17/2015  . Sulfasalazine Hives and Swelling 01/22/2016  . Sulfa antibiotics Hives and Swelling 01/22/2016  . Warfarin and related Other (See Comments) 05/18/2012    ROS:  General: Negative for anorexia, weight loss, fever, chills, fatigue, weakness. ENT: Negative for hoarseness, difficulty swallowing , nasal congestion. CV: Negative for chest pain, angina, palpitations, dyspnea on exertion, peripheral edema.  Respiratory: Negative for dyspnea at rest, dyspnea on exertion, cough, sputum, wheezing.  GI: See history of present illness. GU:  Negative for dysuria, hematuria, urinary incontinence, urinary frequency, nocturnal urination.  Endo: Negative for unusual weight change.    Physical Examination:   BP 93/63 (BP Location: Left Arm, Patient Position: Standing, Cuff Size: Large)   Pulse 85   Ht 6\' 6"  (1.981 m)   Wt 204 lb (92.5 kg)   BMI 23.57 kg/m   General: thin appears lethargic  Eyes: No icterus. Conjunctivae pink. Mouth: Oropharyngeal mucosa moist and pink , no lesions erythema or exudate. Lungs: Clear to auscultation bilaterally. Non-labored. Heart: Regular rate and rhythm, no murmurs rubs or gallops.  Abdomen: Bowel sounds are normal, nontender, nondistended, no hepatosplenomegaly or masses, no abdominal bruits or hernia , no rebound or guarding.   Extremities: No lower extremity edema. No clubbing or deformities. Neuro: Alert and oriented x 3.  Grossly intact. Skin: Warm and dry, no jaundice.   Psych: Alert and cooperative, normal mood and affect.   Imaging Studies: No results  found.  Assessment and Plan:   William Taylor is a 85 y.o. y/o male here to see me for hospital follow up for Melena. Doing well. Hb stable . Main issues are related to postural hypotension, dizziness and nausea in the mornings when his systolic BP is less than 90 mm.   Plan  1. Continue PPI 2. Suggest eating 2-3 saltines each morning when he wakes up to help bump is pressurees which may make him less nauseous.  3. Close follow up with DR Elvera Lennox office.  4. See me back as needed     Dr [name]  MD,MRCP [facility]) Follow up PRN

## 2018-10-07 NOTE — Progress Notes (Signed)
Patient ID: William Taylor, male   DOB: 1930/02/06, 85 y.o.   MRN: [id number] Contacted Dr. Donivan Scull office (cardiologist), regarding orthostatic blood pressure reading. Sitting B/P 110/73, P-85 Left arm, large cuff. Standing B/P 93/63, P-87, left arm, large cuff.  Pt c/o dizziness. I spoke with Pam (nurse) and Dr. Rockey Situ is aware of orthostatic B/P and is currently adjusting his medications. Dr. [name] aware.

## 2018-10-08 ENCOUNTER — Other Ambulatory Visit (INDEPENDENT_AMBULATORY_CARE_PROVIDER_SITE_OTHER): Payer: Medicare HMO

## 2018-10-08 ENCOUNTER — Telehealth: Payer: Self-pay | Admitting: Cardiovascular Disease

## 2018-10-08 DIAGNOSIS — I951 Orthostatic hypotension: Secondary | ICD-10-CM | POA: Diagnosis not present

## 2018-10-08 DIAGNOSIS — I2510 Atherosclerotic heart disease of native coronary artery without angina pectoris: Secondary | ICD-10-CM | POA: Diagnosis not present

## 2018-10-08 DIAGNOSIS — N183 Chronic kidney disease, stage 3 (moderate): Secondary | ICD-10-CM | POA: Diagnosis not present

## 2018-10-08 DIAGNOSIS — I252 Old myocardial infarction: Secondary | ICD-10-CM | POA: Diagnosis not present

## 2018-10-08 DIAGNOSIS — I129 Hypertensive chronic kidney disease with stage 1 through stage 4 chronic kidney disease, or unspecified chronic kidney disease: Secondary | ICD-10-CM | POA: Diagnosis not present

## 2018-10-08 DIAGNOSIS — E876 Hypokalemia: Secondary | ICD-10-CM | POA: Diagnosis not present

## 2018-10-08 LAB — POTASSIUM: Potassium: 4.1 mEq/L (ref 3.5–5.1)

## 2018-10-08 NOTE — Telephone Encounter (Signed)
° °  Spouse calling to report  BP and dizziness   Pt c/o BP issue: STAT if pt c/o blurred vision, one-sided weakness or slurred speech  1. What are your last 5 BP readings? 184/91 sitting, 90/70 standing  2. Are you having any other symptoms (ex. Dizziness, headache, blurred vision, passed out)? dizziness  3. What is your BP issue? BP too high

## 2018-10-09 ENCOUNTER — Other Ambulatory Visit: Payer: Medicare HMO

## 2018-10-09 MED ORDER — MIDODRINE HCL 10 MG PO TABS: ORAL_TABLET | ORAL | 3 refills | Status: DC

## 2018-10-09 NOTE — Addendum Note (Signed)
Addended by: Alvis Lemmings C on: 10/09/2018 03:24 PM   Modules accepted: Orders

## 2018-10-09 NOTE — Telephone Encounter (Signed)
I spoke with the patient's wife regarding BP readings from yesterday.  The patient took Northera 100 mg- 2 tablets (200 mg) yesterday morning and at lunch. At 4 pm, his sitting BP was 184/91- standing- 90/70. The patient took Northera 1 tablet (100 mg) yesterday evening for his 3rd dose.  This morning his BP was 79/46 (91)- he did take Northera- 2 tablets (200 mg) this morning.  Mrs. Moone confirms he is taking the florinef 0.1 mg once daily in the AM & Midodrine 10 mg TID. The patient will be out of Midodrine today (he had a tablet for the morning & for lunch and will be out after that). He will need this refilled to [facility] if he is to stay on it.  As for his dizziness, he is not noticing any real change there. Mrs. Killian reports the patient refers to feeling more "lightheaded" than dizzy. She states he was able to stay up for a while yesterday and appeared to be having a good day, but today he feels "awful."  I advised Mrs. Jankowski I will need to review with Dr. Rockey Situ to confirm the doses of Northera, Midodrine, and florinef that he would like him to be on at this time.  She is aware I will call her back with MD recommendations once received. They have sent BP readings prior to yesterday via MyChart.   The patient is on day 4 of his uptitration of Northera, he is scheduled to increase this to 3 tablets (300 mg) TID tomorrow.

## 2018-10-09 NOTE — Telephone Encounter (Signed)
I spoke with Dr. Rockey Situ regarding the patient's BP readings.  Recommendations are:  1) Confirm high reading from yesterday was sitting completely upright.  2) Continue florinef at the current dose 3) Continue current schedule for up-titration of northera 4) Continue midodrine 10 mg TID (make sure the last dose is 3-5 hours before bed) 5) If sitting BP's are high, then the patient will need to decrease midodrine to 5 mg TID   I have spoken with the patient's wife. She confirms yesterdays elevated BP reading was sitting upright. This was taken by PT and then confirmed on their home monitor with readings being about 2 points different for the SBP.   I have advised her of the above recommendations from Dr. Rockey Situ. She confirms the patient has been taking the last dose of midodrine right before bed. I advised he move this up to 3-5 hours before bed. She is aware if it will be easier, to take the northera and midodrine at the same times as his last dose of northera is about 3 hours before bed. [name] has read back MD recommendations and voices understanding. I have advised that they also avoid taking a BP reading 1st thing in the morning when the patient gets up, per Dr. Rockey Situ. Take AM meds then check BP about 30 mintues - 1 hour after. She will record these readings and they will bring them in on Monday when the patient is here for his 1 week follow up appointment.

## 2018-10-10 DIAGNOSIS — I951 Orthostatic hypotension: Secondary | ICD-10-CM | POA: Diagnosis not present

## 2018-10-10 DIAGNOSIS — I2510 Atherosclerotic heart disease of native coronary artery without angina pectoris: Secondary | ICD-10-CM | POA: Diagnosis not present

## 2018-10-10 DIAGNOSIS — I129 Hypertensive chronic kidney disease with stage 1 through stage 4 chronic kidney disease, or unspecified chronic kidney disease: Secondary | ICD-10-CM | POA: Diagnosis not present

## 2018-10-10 DIAGNOSIS — I252 Old myocardial infarction: Secondary | ICD-10-CM | POA: Diagnosis not present

## 2018-10-10 DIAGNOSIS — N183 Chronic kidney disease, stage 3 (moderate): Secondary | ICD-10-CM | POA: Diagnosis not present

## 2018-10-11 NOTE — Progress Notes (Signed)
Cardiology Office Note  Date:  10/12/2018   ID:  Hakiem, Malizia 1930/03/08, MRN [id number]  PCP:  [name], MD   Chief Complaint  Patient presents with  . Other    Follow up Northera initation. Patient c/o sob. Meds reviewed verbally with patient.     HPI:  [name] is a very pleasant 85 year old gentleman with a history of  coronary artery disease, stent in 2005,  atrial fibrillation with RVR in the setting of UTI September 2011,  stent placement May 2012 to the OM 2,  PE following catheterization started on anticoagulation, atrial fibrillation in 2013,  Last atrial fibrillation July 2018  atypical chest pain July 2018 History of anemia who presents for follow-up of his coronary disease and atrial fibrillation   INTERVAL HISTORY: hospital visit, 09/16/2018-09/23/2018.   severe orthostasis treated with midodrine and Florinef Continued orthostasis He was started on Northera on his last clinic visit last week He has since titrated up to 400 mg 3 times daily Continues to have orthostasis but on further discussion he is not taking his morning medications including midodrine until sometimes 10 in the morning then mid afternoon and then 7 PM  Has some nausea with eating breakfast previously felt secondary to orthostasis  Overall his wife feels the medication is helping, he is not spending as much time in bed, eating more, less dizziness  Orthostatic numbers checked today 170/100 supine 150/89 sitting 132/69 standing, checked after 3 minutes  Blood pressure measurements from home show continued systolic pressures in the 90 range with standing 1 day he is working with physical therapy and systolic pressure was 532 but it was a isolated case  EKG personally reviewed by myself on todays visit Shows normal sinus rhythm with rate 77 bpm no significant ST or T wave changes   On his last clinic visit  HR BP SYMPTOMS  Lying 78  151/96   Sitting 80  149/92    Standing (64min) 84 110/76 dizzy  Standing (109min) 97 90/60 dizzy      OTHER PAST MEDICAL HISTORY REVIEWED BY ME FOR TODAY'S VISIT:   hospitalization February 2020 dehydration, orthostatic hypotension Anorexia, melena  Stress Test done 09/19/2018: pharmacological myocardial perfusion imaging study with no significant  ischemia. Normal wall motion, EF estimated at 69%. No EKG changes concerning for ischemia at peak stress or in recovery. Low risk scan.    hospitalization for atrial fibrillation 02/2017 had orthostatic hypotension, chest pain developed palpitations Friday, July 20,  He called our office, was given the choice of emergency room for cardioversion as he has been anticoagulated or outpatient treatment with oral amiodarone. He decided to go to the emergency room Chest pain was felt to be atypical, no testing performed Emergency notes reviewed and they detail chest pain when sitting up  he reports having chest pain when he rolled over in bed this morning  history of anemia hematocrit down to 28 EGD, colonoscopy, video capsule enteroscopy  Stool was dark from iron which she takes once a day HCT 28 to 34   previous  injury of his left shoulder, rotator cuff tear   previous episode ofchest pain, went to the emergency room November 2016. Ruled out for MI, had Myoview showing no ischemia. He was noted to have atrial tachycardia with rates up to the 150 range, metoprolol tartrate increased up to 50 mg twice a day. On the higher dose metoprolol, he has not had any further episodes of tachycardia since discharge.  Previously he did note heart rate up to 190 going down to 150, measured with a pulse oximeter. None recently  catheterization in 2009 showing patent stent with no other significant disease, normal systolic function in September 2011 who presented to the emergency room September 2011 with malaise, atrial fibrillation with RVR in the setting of a urinary tract infection,  recent admission to the hospital November 14 2010 for atrial fibrillation that developed after pushing mowing with some chest pain. He converted back to normal sinus rhythm, Presenting to [facility] with chest discomfort with cardiac catheterization Dec 25 2010, showing 90% OM 2 disease, transferred to [facility] with DES stent placed, 2.25 x 16 mm POMUS stent, with flank pain developing after discharge, admitted to [facility] and diagnosed with pulmonary embolism with heavy clot burden.   Found to be in atrial fibrillation in 2013. He had symptoms of shortness of breath with exertion.  After one month on xarelto, amiodarone load and titration downward was started with conversion to NSR in October 2013  EKG the end of October 2013 confirmed normal sinus rhythm.   History of back surgery in 2008, history of prostate cancer followed by Dr. Jacqlyn Larsen   He was admitted to the hospital 10/27/2013 with chest pain. He had held his Plavix for colonoscopy 10/26/2013 with polypectomy x3. Cardiac catheterization performed given his severe chest pain and no disease showing severe small vessel disease, patent stents Amiodarone was held for bradycardia, he was started on isosorbide 30 mg daily, Plavix was held, he was continued on aspirin and xarelto.  Cardiac catheterization results; 90% disease at the ostium of a small diagonal vessel, small OM vessel with 80% disease in the proximal region Otherwise stents are patent  Echocardiogram 10/28/2013 shows normal LV function, mildly dilated left atrium, mild aortic regurg, normal right ventricular systolic pressures   PMH:   has a past medical history of Cancer ([place]), Colon polyp, Coronary artery disease, Hyperlipidemia, Hypertension, MI (myocardial infarction) ([place]), PAF (paroxysmal atrial fibrillation) ([place]), and Skin cancer.  PSH:    Past Surgical History:  Procedure Laterality Date  . CARDIAC CATHETERIZATION  10/2013   [facility]  . CATARACT EXTRACTION  Oct. 3, 2012   right  eye  . colonoscopy    . COLONOSCOPY W/ POLYPECTOMY  2015   Dr Rayann Heman  . COLONOSCOPY WITH PROPOFOL N/A 08/15/2016   Procedure: COLONOSCOPY WITH PROPOFOL;  Surgeon: [name], MD;  Location: [facility] ENDOSCOPY;  Service: Endoscopy;  Laterality: N/A;  . CORONARY ANGIOPLASTY  2009   2005; s/p stent  . ENTEROSCOPY N/A 09/22/2018   Procedure: ENTEROSCOPY;  Surgeon: Lin Landsman, MD;  Location: [place];  Service: Gastroenterology;  Laterality: N/A;  . ESOPHAGOGASTRODUODENOSCOPY (EGD) WITH PROPOFOL N/A 08/15/2016   Procedure: ESOPHAGOGASTRODUODENOSCOPY (EGD) WITH PROPOFOL;  Surgeon: [name], MD;  Location: [facility] ENDOSCOPY;  Service: Endoscopy;  Laterality: N/A;  . GIVENS CAPSULE STUDY N/A 09/26/2016   Procedure: GIVENS CAPSULE STUDY;  Surgeon: [name], MD;  Location: [facility] ENDOSCOPY;  Service: Endoscopy;  Laterality: N/A;  . HERNIA REPAIR  2013  . LUMBAR SPINE SURGERY    . UPPER GI ENDOSCOPY  Sept 2015   Dr Rayann Heman    Current Outpatient Medications  Medication Sig Dispense Refill  . amiodarone (PACERONE) 200 MG tablet Take 1 tablet (200 mg total) by mouth daily. 90 tablet 0  . cyproheptadine (PERIACTIN) 4 MG tablet Take 1 tablet (4 mg total) by mouth 3 (three) times daily as needed for allergies. 90 tablet 3  .  Droxidopa (NORTHERA) 100 MG CAPS Take 600 mg by mouth 3 (three) times daily. 540 capsule 6  . feeding supplement, ENSURE ENLIVE, (ENSURE ENLIVE) LIQD Take 237 mLs by mouth daily. 90 Bottle 0  . fenofibrate micronized (ANTARA) 130 MG capsule TAKE 1 CAPSULE BY MOUTH  DAILY BEFORE BREAKFAST 90 capsule 1  . finasteride (PROSCAR) 5 MG tablet Take 1 tablet (5 mg total) by mouth daily. 90 tablet 2  . fludrocortisone (FLORINEF) 0.1 MG tablet Take 1 tablet (0.1 mg total) by mouth daily. 30 tablet 0  . levobunolol (BETAGAN) 0.5 % ophthalmic solution Place 1 drop into the right eye 2 (two) times daily.     Marland Kitchen levothyroxine (SYNTHROID, LEVOTHROID) 50 MCG tablet Take 1 tablet (50 mcg total) by  mouth daily at 6 (six) AM. 90 tablet 0  . midodrine (PROAMATINE) 10 MG tablet Take 1 tablet (10 mg) by mouth three times a day- morning, noon, & at least 3 hours before bed 90 tablet 3  . nitroGLYCERIN (NITROSTAT) 0.4 MG SL tablet DISSOLVE UNDER THE TONGUE 1 TABLET EVERY 5 MINUTES AS NEEDED FOR CHEST PAIN 25 tablet 3  . Omega-3 Fatty Acids (FISH OIL) 1000 MG CAPS Take 1,000 mg by mouth daily.    . ondansetron (ZOFRAN-ODT) 8 MG disintegrating tablet Take 0.5 tablets (4 mg total) by mouth every 8 (eight) hours as needed for nausea. 20 tablet 1  . pantoprazole (PROTONIX) 40 MG tablet Take 1 tablet (40 mg total) by mouth daily. 30 tablet 0  . polyethylene glycol powder (GLYCOLAX/MIRALAX) powder Take 17 g by mouth 2 (two) times daily as needed. 3350 g 1  . pravastatin (PRAVACHOL) 40 MG tablet TAKE 1 TABLET BY MOUTH AT  BEDTIME 90 tablet 3  . ranolazine (RANEXA) 500 MG 12 hr tablet Take 1 tablet (500 mg total) by mouth 2 (two) times daily. 60 tablet 3  . tamsulosin (FLOMAX) 0.4 MG CAPS capsule Take 0.4 mg by mouth as needed.    Alveda Reasons 20 MG TABS tablet TAKE 1 TABLET DAILY 90 tablet 1   No current facility-administered medications for this visit.      Allergies:   Bee venom; Sulfasalazine; Sulfa antibiotics; and Warfarin and related   Social History:  The patient  reports that he has never smoked. He has never used smokeless tobacco. He reports that he does not drink alcohol or use drugs.   Family History:   family history includes Colon cancer in his father; Coronary artery disease in his brother; Other (age of onset: 36) in his brother; Stroke in his mother.    Review of Systems: Review of Systems  Constitutional: Negative.   Eyes: Negative.   Respiratory: Negative.   Cardiovascular: Negative.   Gastrointestinal: Positive for nausea.  Genitourinary: Negative.   Musculoskeletal: Negative.   Neurological: Positive for dizziness.  Psychiatric/Behavioral: Negative.   All other systems  reviewed and are negative.    PHYSICAL EXAM: VS:  BP 131/89 (BP Location: Left Arm, Patient Position: Sitting, Cuff Size: Normal)   Pulse 77   Ht 6\' 4"  (1.93 m)   Wt 201 lb (91.2 kg)   BMI 24.47 kg/m  , BMI Body mass index is 24.47 kg/m.  Constitutional:  oriented to person, place, and time. No distress.  HENT:  Head: Grossly normal Eyes:  no discharge. No scleral icterus.  Neck: No JVD, no carotid bruits  Cardiovascular: Regular rate and rhythm, no murmurs appreciated Pulmonary/Chest: Clear to auscultation bilaterally, no wheezes or rails Abdominal: Soft.  no distension.  no tenderness.  Musculoskeletal: Normal range of motion Neurological:  normal muscle tone. Coordination normal. No atrophy Skin: Skin warm and dry Psychiatric: normal affect, pleasant   Recent Labs: 08/27/2018: ALT 15 09/16/2018: TSH 4.984 09/23/2018: Magnesium 1.7 09/30/2018: BUN 17; Creatinine, Ser 1.23; Hemoglobin 11.5; Platelets 292.0; Sodium 137 10/08/2018: Potassium 4.1    Lipid Panel Lab Results  Component Value Date   CHOL 110 06/28/2016   HDL 29.90 (L) 06/28/2016   LDLCALC 64 06/28/2016   TRIG 80.0 06/28/2016      Wt Readings from Last 3 Encounters:  10/12/18 201 lb (91.2 kg)  10/07/18 204 lb (92.5 kg)  10/05/18 202 lb (91.6 kg)     ASSESSMENT AND PLAN:  Severe orthostasis Recommend he continue on the Northera titrating up to 600 mg 3 times daily He is looking better, more active, able to stand without as much dizziness We would hope to wean down on his midodrine or Florinef in the near future Recommend he take midodrine 7 in the morning, before lunch and at 5 PM Currently taking the medication too late  Mixed hyperlipidemia  Cholesterol at goal, no further changes made  Coronary artery disease involving native coronary artery of native heart without angina pectoris Recent stress test no ischemia, no further work-up needed  Atrial fibrillation, unspecified type ([place]) Plan: EKG  12-Lead  on Xarelto Continue amiodarone Other medications held for hypotension Holding normal sinus rhythm  Congestive heart failure, unspecified congestive heart failure chronicity, unspecified congestive heart failure type (HCC)  Euvolemic, no medication changes No changes  Chronic kidney disease (CKD), stage III (moderate) Avoid NSAIDs Stay hydrated  Iron deficiency anemia, unspecified iron deficiency anemia type Stressed importance of aggressive diet Trying to gain his weight back  Orthostatic hypotension Continue titration up of his Northera as detailed, up to 600 3 times daily Prescription has been sent in   Total encounter time more than 25 minutes  Greater than 50% was spent in counseling and coordination of care with the patient  Follow-up 1 month   Orders Placed This Encounter  Procedures  . EKG 12-Lead     Signed, Esmond Plants, M.D., Ph.D. 10/12/2018  [place], [place]

## 2018-10-12 ENCOUNTER — Ambulatory Visit: Payer: Medicare HMO | Admitting: Cardiovascular Disease

## 2018-10-12 ENCOUNTER — Encounter: Payer: Self-pay | Admitting: Cardiovascular Disease

## 2018-10-12 VITALS — BP 131/89 | HR 77 | Ht 76.0 in | Wt 201.0 lb

## 2018-10-12 DIAGNOSIS — I480 Paroxysmal atrial fibrillation: Secondary | ICD-10-CM | POA: Diagnosis not present

## 2018-10-12 DIAGNOSIS — N183 Chronic kidney disease, stage 3 unspecified: Secondary | ICD-10-CM

## 2018-10-12 DIAGNOSIS — I951 Orthostatic hypotension: Secondary | ICD-10-CM | POA: Diagnosis not present

## 2018-10-12 DIAGNOSIS — I2782 Chronic pulmonary embolism: Secondary | ICD-10-CM

## 2018-10-12 DIAGNOSIS — C61 Malignant neoplasm of prostate: Secondary | ICD-10-CM

## 2018-10-12 DIAGNOSIS — I25118 Atherosclerotic heart disease of native coronary artery with other forms of angina pectoris: Secondary | ICD-10-CM

## 2018-10-12 MED ORDER — DROXIDOPA 100 MG PO CAPS: 600.0000 mg | ORAL_CAPSULE | Freq: Three times a day (TID) | ORAL | 6 refills | Status: DC

## 2018-10-12 NOTE — Patient Instructions (Addendum)
Medication Instructions:   Morning pills at 7 to 8 Am (midorine and northera)  Take noon pills at 12  Evening pills at 5 pm  If you need a refill on your cardiac medications before your next appointment, please call your pharmacy.  Medication Samples have been provided to the patient.  Drug name: Northera       Strength: 100 mg        Qty: 2 bottles  LOT: [id number]  Exp.Date: 02/2021   Lab work: No new labs needed   If you have labs (blood work) drawn today and your tests are completely normal, you will receive your results only by: Marland Kitchen MyChart Message (if you have MyChart) OR . A paper copy in the mail If you have any lab test that is abnormal or we need to change your treatment, we will call you to review the results.   Testing/Procedures: No new testing needed   Follow-Up: At [facility], you and your health needs are our priority.  As part of our continuing mission to provide you with exceptional heart care, we have created designated Provider Care Teams.  These Care Teams include your primary Cardiologist (physician) and Advanced Practice Providers (APPs -  Physician Assistants and Nurse Practitioners) who all work together to provide you with the care you need, when you need it.  . You will need a follow up appointment in 1 month .  Marland Kitchen Providers on your designated Care Team:   . Murray Hodgkins, NP . Christell Faith, PA-C . Marrianne Mood, PA-C  Any Other Special Instructions Will Be Listed Below (If Applicable).  For educational health videos Log in to : [email or web address] Or : [email or web address], password : [email or web address]

## 2018-10-13 ENCOUNTER — Telehealth: Payer: Self-pay

## 2018-10-13 ENCOUNTER — Ambulatory Visit: Payer: Medicare HMO | Admitting: Cardiovascular Disease

## 2018-10-13 MED ORDER — DROXIDOPA 100 MG PO CAPS: 600.0000 mg | ORAL_CAPSULE | Freq: Three times a day (TID) | ORAL | 6 refills | Status: DC

## 2018-10-13 NOTE — Telephone Encounter (Signed)
Prescription signed and Faxed.  Successful confirmation received.

## 2018-10-13 NOTE — Telephone Encounter (Signed)
Received a fax from [facility] stating they are unable to fill the limited distribution medication prescription for Droxidopa (Northera) 100MG .   The pharmacies that are able to dispense this medication is: [facility] [phone]  I reached out to the patients wife William Taylor (ok per [facility]) to confirm which pharmacy they preferred. She stated they was not familiar with [facility] and they would like to do [facility]. Also made the patient aware that there will be a 800 number calling them to verifying mailing address. Made her aware that they have to answer that call and verify that information before medication can be shipped to them. She stated she will be looking for this call. Made her aware that in the meantime that if he needed any medication to contact us and we could help out if we could.   Spoke with Layna from [facility].  Provided the Diagnosis code for this medication and she stated to fax a prescription to [phone] and they will start verifying insurance and contact the patient to verify shipping address.   Prescription is printed. Awaiting Dr. Gwenyth Ober Signature.

## 2018-10-14 ENCOUNTER — Telehealth: Payer: Self-pay

## 2018-10-14 ENCOUNTER — Other Ambulatory Visit: Payer: Self-pay | Admitting: *Deleted

## 2018-10-14 DIAGNOSIS — I129 Hypertensive chronic kidney disease with stage 1 through stage 4 chronic kidney disease, or unspecified chronic kidney disease: Secondary | ICD-10-CM | POA: Diagnosis not present

## 2018-10-14 DIAGNOSIS — N183 Chronic kidney disease, stage 3 (moderate): Secondary | ICD-10-CM | POA: Diagnosis not present

## 2018-10-14 DIAGNOSIS — I951 Orthostatic hypotension: Secondary | ICD-10-CM | POA: Diagnosis not present

## 2018-10-14 DIAGNOSIS — I252 Old myocardial infarction: Secondary | ICD-10-CM | POA: Diagnosis not present

## 2018-10-14 DIAGNOSIS — I2510 Atherosclerotic heart disease of native coronary artery without angina pectoris: Secondary | ICD-10-CM | POA: Diagnosis not present

## 2018-10-14 DIAGNOSIS — C61 Malignant neoplasm of prostate: Secondary | ICD-10-CM

## 2018-10-14 MED ORDER — FLUDROCORTISONE ACETATE 0.1 MG PO TABS: 0.1000 mg | ORAL_TABLET | Freq: Every day | ORAL | 3 refills | Status: DC

## 2018-10-14 NOTE — Telephone Encounter (Signed)
PA started through [facility] ([facility])  Rx #: [id number]  Northera 100MG  capsules  Wait for Questions [facility] 2017 NCPDP typically responds with questions in less than 15 minutes, but may take up to 24 hours.

## 2018-10-15 NOTE — Telephone Encounter (Signed)
[name] called due to nausea and hypertension. BP 751Z systolic. Told patient to recheck in 1 hour and if still feeling poorly to be seen in the ER.   Allegra Lai, MD 10/15/2018 6:58 PM

## 2018-10-15 NOTE — Telephone Encounter (Signed)
Wait for Determination Please wait for [facility] [employer] 2017 NCPDP to return a determination.

## 2018-10-16 ENCOUNTER — Other Ambulatory Visit: Payer: Self-pay | Admitting: Family Medicine

## 2018-10-16 MED ORDER — FINASTERIDE 5 MG PO TABS: 5.0000 mg | ORAL_TABLET | Freq: Every day | ORAL | 2 refills | Status: DC

## 2018-10-16 NOTE — Telephone Encounter (Signed)
Pt has been approved for Northera Cap 100 mg tablet. 10/15/2018-11/12/2018.

## 2018-10-18 NOTE — Telephone Encounter (Signed)
Can he come in on Monday clinic to see me? We might need to stop midodrine if pressures high, Stay on northera/florinef

## 2018-10-19 ENCOUNTER — Encounter: Payer: Self-pay | Admitting: Cardiovascular Disease

## 2018-10-19 ENCOUNTER — Ambulatory Visit: Payer: Medicare HMO | Admitting: Cardiovascular Disease

## 2018-10-19 VITALS — BP 143/73 | HR 66 | Ht 76.0 in | Wt 197.5 lb

## 2018-10-19 DIAGNOSIS — I480 Paroxysmal atrial fibrillation: Secondary | ICD-10-CM | POA: Diagnosis not present

## 2018-10-19 MED ORDER — MIDODRINE HCL 10 MG PO TABS: 5.0000 mg | ORAL_TABLET | Freq: Three times a day (TID) | ORAL | 6 refills | Status: DC | PRN

## 2018-10-19 MED ORDER — MIDODRINE HCL 10 MG PO TABS: 5.0000 mg | ORAL_TABLET | Freq: Three times a day (TID) | ORAL | 6 refills | Status: DC

## 2018-10-19 NOTE — Telephone Encounter (Signed)
I spoke with the patient's wife this morning.  Saturday- 1st thing in the morning- sitting BP (HR): 121/72 (71) & standing: 66/52 (82)                  10:30 am- sitting BP (HR): 116/75 (?)  Sunday- 11:00 am- sitting BP (HR)- 83/58 (100)                Went back to bed not feeling well- recheck about an 1 hour later- sitting BP (HR): 120/65 (75)  Per the patient's wife, the patient fell Saturday morning about 3 am- he fell out of the bed after reaching for his covers that had slid off the bed- she states, "it took forever for [place] to get him up." He other concern is his lack of appetite and hydration. The patient feels like he just can't eat/ feels nauseated.  Per Mrs. Schamberger, the patient had an endoscopy the last time he was in the hospital and they were told this did not show anything.  Mrs. Madero reports the patient has followed up with Dr. [name], and has been told they really don't know what else to do for him. The patient is only eating about a 1/2 of ensure with ice cream mixed in with it a day.   I did offer follow up with Dr. Rockey Situ this afternoon at 3:20 pm. The patient's wife is agreeable.   She tells me today that she is also caring for her brother in law in his 33's and her sister who is in her 81's.  I advised her to please make sure she is eating/ drinking adequately.

## 2018-10-19 NOTE — Progress Notes (Signed)
Cardiology Office Note  Date:  10/19/2018   ID:  Emerson, Schreifels May 11, 1930, MRN [id number]  PCP:  [name], MD   Chief Complaint  Patient presents with  . Other    Blood pressure follow up. Patient c/o SOB. Meds reviewed verbally with patient.     HPI:  Mr. Matheney is a very pleasant 85 year old gentleman with a history of  coronary artery disease, stent in 2005,  atrial fibrillation with RVR in the setting of UTI September 2011,  stent placement May 2012 to the OM 2,  PE following catheterization started on anticoagulation, atrial fibrillation in 2013,  Last atrial fibrillation July 2018  atypical chest pain July 2018 History of anemia who presents for follow-up of his coronary disease and atrial fibrillation   INTERVAL HISTORY: The patient reports today for follow up after being seen by me on 10/05/2018. He is doing well overall. Accompanied by his wife. Today was his first day without the midodrine. He is still taking the Northera 600 mg 3 times daily also on Florinef daily. His appetite was not bad today. He is still experiencing occasional dizziness.   Wife said he fell yesterday morning when he got up to use bathroom. States he fell over due to balance when he tried to pick up something that was in his way.  Mechanical fall  Says he first sits up on his bed before getting up from it. He said he's fine going to the bathroom and only starts feeling dizzy when he returns to bed. He struggles to get out of the shower due to dizziness, showers every other day.   Some nights it takes him hours to fall asleep, but other nights he has no issues. Some nights he will wake up thinking he has slept well, but it has only been 2 hours.   He still complains of stomach issues. He takes amiodarone in the morning.  Today's Blood pressure 143/73  HR BP SYMPTOMS  Lying 81  141/86   Sitting 79  136/79   Standing (44min) 89 109/66   Standing (29min)  /     CR 1.23 Glucose  111 K 3.4  OTHER PAST MEDICAL HISTORY REVIEWED BY ME FOR TODAY'S VISIT:   hospitalization February 2020 dehydration, orthostatic hypotension Anorexia, melena  Stress Test done 09/19/2018: pharmacological myocardial perfusion imaging study with no significant  ischemia. Normal wall motion, EF estimated at 69%. No EKG changes concerning for ischemia at peak stress or in recovery. Low risk scan.    hospitalization for atrial fibrillation 02/2017 had orthostatic hypotension, chest pain developed palpitations Friday, July 20,  He called our office, was given the choice of emergency room for cardioversion as he has been anticoagulated or outpatient treatment with oral amiodarone. He decided to go to the emergency room Chest pain was felt to be atypical, no testing performed Emergency notes reviewed and they detail chest pain when sitting up  he reports having chest pain when he rolled over in bed this morning  history of anemia hematocrit down to 28 EGD, colonoscopy, video capsule enteroscopy  Stool was dark from iron which she takes once a day HCT 28 to 34   previous  injury of his left shoulder, rotator cuff tear   previous episode ofchest pain, went to the emergency room November 2016. Ruled out for MI, had Myoview showing no ischemia. He was noted to have atrial tachycardia with rates up to the 150 range, metoprolol tartrate increased up to 50  mg twice a day. On the higher dose metoprolol, he has not had any further episodes of tachycardia since discharge. Previously he did note heart rate up to 190 going down to 150, measured with a pulse oximeter. None recently  catheterization in 2009 showing patent stent with no other significant disease, normal systolic function in September 2011 who presented to the emergency room September 2011 with malaise, atrial fibrillation with RVR in the setting of a urinary tract infection, recent admission to the hospital November 14 2010 for atrial  fibrillation that developed after pushing mowing with some chest pain. He converted back to normal sinus rhythm, Presenting to [facility] with chest discomfort with cardiac catheterization Dec 25 2010, showing 90% OM 2 disease, transferred to [facility] with DES stent placed, 2.25 x 16 mm POMUS stent, with flank pain developing after discharge, admitted to [facility] and diagnosed with pulmonary embolism with heavy clot burden.   Found to be in atrial fibrillation in 2013. He had symptoms of shortness of breath with exertion.  After one month on xarelto, amiodarone load and titration downward was started with conversion to NSR in October 2013  EKG the end of October 2013 confirmed normal sinus rhythm.   History of back surgery in 2008, history of prostate cancer followed by Dr. Jacqlyn Larsen   He was admitted to the hospital 10/27/2013 with chest pain. He had held his Plavix for colonoscopy 10/26/2013 with polypectomy x3. Cardiac catheterization performed given his severe chest pain and no disease showing severe small vessel disease, patent stents Amiodarone was held for bradycardia, he was started on isosorbide 30 mg daily, Plavix was held, he was continued on aspirin and xarelto.  Cardiac catheterization results; 90% disease at the ostium of a small diagonal vessel, small OM vessel with 80% disease in the proximal region Otherwise stents are patent  Echocardiogram 10/28/2013 shows normal LV function, mildly dilated left atrium, mild aortic regurg, normal right ventricular systolic pressures   PMH:   has a past medical history of Cancer ([place]), Colon polyp, Coronary artery disease, Hyperlipidemia, Hypertension, MI (myocardial infarction) ([place]), PAF (paroxysmal atrial fibrillation) ([place]), and Skin cancer.  PSH:    Past Surgical History:  Procedure Laterality Date  . CARDIAC CATHETERIZATION  10/2013   [facility]  . CATARACT EXTRACTION  Oct. 3, 2012   right eye  . colonoscopy    . COLONOSCOPY W/ POLYPECTOMY  2015    Dr Rayann Heman  . COLONOSCOPY WITH PROPOFOL N/A 08/15/2016   Procedure: COLONOSCOPY WITH PROPOFOL;  Surgeon: [name], MD;  Location: [facility] ENDOSCOPY;  Service: Endoscopy;  Laterality: N/A;  . CORONARY ANGIOPLASTY  2009   2005; s/p stent  . ENTEROSCOPY N/A 09/22/2018   Procedure: ENTEROSCOPY;  Surgeon: Lin Landsman, MD;  Location: [facility];  Service: Gastroenterology;  Laterality: N/A;  . ESOPHAGOGASTRODUODENOSCOPY (EGD) WITH PROPOFOL N/A 08/15/2016   Procedure: ESOPHAGOGASTRODUODENOSCOPY (EGD) WITH PROPOFOL;  Surgeon: [name], MD;  Location: [facility] ENDOSCOPY;  Service: Endoscopy;  Laterality: N/A;  . GIVENS CAPSULE STUDY N/A 09/26/2016   Procedure: GIVENS CAPSULE STUDY;  Surgeon: [name], MD;  Location: [facility] ENDOSCOPY;  Service: Endoscopy;  Laterality: N/A;  . HERNIA REPAIR  2013  . LUMBAR SPINE SURGERY    . UPPER GI ENDOSCOPY  Sept 2015   Dr Rayann Heman    Current Outpatient Medications  Medication Sig Dispense Refill  . amiodarone (PACERONE) 200 MG tablet Take 1 tablet (200 mg total) by mouth daily. 90 tablet 0  . cyproheptadine (PERIACTIN) 4 MG tablet  Take 1 tablet (4 mg total) by mouth 3 (three) times daily as needed for allergies. 90 tablet 3  . Droxidopa (NORTHERA) 100 MG CAPS Take 600 mg by mouth 3 (three) times daily. 540 capsule 6  . feeding supplement, ENSURE ENLIVE, (ENSURE ENLIVE) LIQD Take 237 mLs by mouth daily. 90 Bottle 0  . fenofibrate micronized (ANTARA) 130 MG capsule TAKE 1 CAPSULE BY MOUTH  DAILY BEFORE BREAKFAST 90 capsule 1  . finasteride (PROSCAR) 5 MG tablet Take 1 tablet (5 mg total) by mouth daily. 90 tablet 2  . fludrocortisone (FLORINEF) 0.1 MG tablet Take 1 tablet (0.1 mg total) by mouth daily. 90 tablet 3  . levobunolol (BETAGAN) 0.5 % ophthalmic solution Place 1 drop into the right eye 2 (two) times daily.     Marland Kitchen levothyroxine (SYNTHROID, LEVOTHROID) 50 MCG tablet Take 1 tablet (50 mcg total) by mouth daily at 6 (six) AM. 90 tablet 0  . nitroGLYCERIN  (NITROSTAT) 0.4 MG SL tablet DISSOLVE UNDER THE TONGUE 1 TABLET EVERY 5 MINUTES AS NEEDED FOR CHEST PAIN 25 tablet 3  . Omega-3 Fatty Acids (FISH OIL) 1000 MG CAPS Take 1,000 mg by mouth daily.    . ondansetron (ZOFRAN-ODT) 8 MG disintegrating tablet Take 0.5 tablets (4 mg total) by mouth every 8 (eight) hours as needed for nausea. 20 tablet 1  . pantoprazole (PROTONIX) 40 MG tablet Take 1 tablet (40 mg total) by mouth daily. 30 tablet 0  . polyethylene glycol powder (GLYCOLAX/MIRALAX) powder Take 17 g by mouth 2 (two) times daily as needed. 3350 g 1  . pravastatin (PRAVACHOL) 40 MG tablet TAKE 1 TABLET BY MOUTH AT  BEDTIME 90 tablet 3  . ranolazine (RANEXA) 500 MG 12 hr tablet Take 1 tablet (500 mg total) by mouth 2 (two) times daily. 60 tablet 3  . tamsulosin (FLOMAX) 0.4 MG CAPS capsule Take 0.4 mg by mouth as needed.    Alveda Reasons 20 MG TABS tablet TAKE 1 TABLET DAILY 90 tablet 1  . midodrine (PROAMATINE) 10 MG tablet Take 0.5-1 tablets (5-10 mg total) by mouth 3 (three) times daily as needed (As needed for dizziness or low blood pressures). 90 tablet 6   No current facility-administered medications for this visit.      Allergies:   Bee venom; Sulfasalazine; Sulfa antibiotics; and Warfarin and related   Social History:  The patient  reports that he has never smoked. He has never used smokeless tobacco. He reports that he does not drink alcohol or use drugs.   Family History:   family history includes Colon cancer in his father; Coronary artery disease in his brother; Other (age of onset: 84) in his brother; Stroke in his mother.    Review of Systems: Review of Systems  Constitutional: Negative.   Eyes: Negative.   Respiratory: Negative.   Cardiovascular: Negative.   Gastrointestinal: Negative.   Genitourinary: Negative.   Musculoskeletal: Negative.        Gait instability  Neurological: Positive for dizziness.  Psychiatric/Behavioral: Negative.   All other systems reviewed and  are negative.    PHYSICAL EXAM: VS:  BP (!) 143/73 (BP Location: Left Arm, Patient Position: Sitting, Cuff Size: Normal)   Pulse 66   Ht 6\' 4"  (1.93 m)   Wt 197 lb 8 oz (89.6 kg)   BMI 24.04 kg/m  , BMI Body mass index is 24.04 kg/m.   Constitutional:  oriented to person, place, and time. No distress.  HENT:  Head: Grossly normal  Eyes:  no discharge. No scleral icterus.  Neck: No JVD, no carotid bruits  Cardiovascular: Regular rate and rhythm, no murmurs appreciated  Pulmonary/Chest: Clear to auscultation bilaterally, no wheezes or rales Abdominal: Soft.  no distension.  no tenderness.  Musculoskeletal: Normal range of motion Neurological:  normal muscle tone. Coordination normal. No atrophy Skin: Skin warm and dry Psychiatric: normal affect, pleasant  Recent Labs: 08/27/2018: ALT 15 09/16/2018: TSH 4.984 09/23/2018: Magnesium 1.7 09/30/2018: BUN 17; Creatinine, Ser 1.23; Hemoglobin 11.5; Platelets 292.0; Sodium 137 10/08/2018: Potassium 4.1    Lipid Panel Lab Results  Component Value Date   CHOL 110 06/28/2016   HDL 29.90 (L) 06/28/2016   LDLCALC 64 06/28/2016   TRIG 80.0 06/28/2016      Wt Readings from Last 3 Encounters:  10/19/18 197 lb 8 oz (89.6 kg)  10/12/18 201 lb (91.2 kg)  10/07/18 204 lb (92.5 kg)     ASSESSMENT AND PLAN:  Severe orthostasis Improved symptoms on the Northera with Florinef Midodrine held for hypertension Plan: Please continue midodrine 5 to 10 mg three times a day as needed for dizziness from low pressures  Mixed hyperlipidemia  Cholesterol at goal, no further changes made  Coronary artery disease involving native coronary artery of native heart without angina pectoris Recent stress test no ischemia, no further work-up needed  Atrial fibrillation, unspecified type ([place]) Plan: EKG 12-Lead  on Xarelto Continue amiodarone, will recommend he take this in the evening to see if this helps his nausea Other medications held for  hypotension Holding normal sinus rhythm.  No EKG on today's visit  Congestive heart failure, unspecified congestive heart failure chronicity, unspecified congestive heart failure type (HCC)  Euvolemic, no medication changes No changes  Chronic kidney disease (CKD), stage III (moderate) Avoid NSAIDs Stay hydrated  Iron deficiency anemia, unspecified iron deficiency anemia type Stressed importance of increasing his calorie intake Nutrition limited by periodic nausea Trying to gain his weight back  Orthostatic hypotension Continue Northera  600 3 times daily Dramatic improvement in orthostasis numbers Previously was essentially bedbound now is out of the bed more He did receive a phone call from the company, medication on the way   Total encounter time more than 25 minutes  Greater than 50% was spent in counseling and coordination of care with the patient  Follow-up 3 months   Orders Placed This Encounter  Procedures  . EKG 12-Lead   I, Jesus Reyes am acting as a scribe for Ida Rogue, M.D., Ph.D.  I, Ida Rogue, M.D. Ph.D., have reviewed the above documentation for accuracy and completeness, and I agree with the above.   Signed, Esmond Plants, M.D., Ph.D. 10/19/2018  [place], [place]

## 2018-10-19 NOTE — Patient Instructions (Addendum)
Medication Instructions:  Please continue midodrine 5 to 10 mg three times a day as needed for dizziness from low pressures  Please take the amiodarone in the evening after dinner  If you need a refill on your cardiac medications before your next appointment, please call your pharmacy.    Lab work: No new labs needed   If you have labs (blood work) drawn today and your tests are completely normal, you will receive your results only by: Marland Kitchen MyChart Message (if you have MyChart) OR . A paper copy in the mail If you have any lab test that is abnormal or we need to change your treatment, we will call you to review the results.   Testing/Procedures: No new testing needed   Follow-Up: At [facility], you and your health needs are our priority.  As part of our continuing mission to provide you with exceptional heart care, we have created designated Provider Care Teams.  These Care Teams include your primary Cardiologist (physician) and Advanced Practice Providers (APPs -  Physician Assistants and Nurse Practitioners) who all work together to provide you with the care you need, when you need it.  . You will need a follow up appointment in 3 months .   Please call our office 2 months in advance to schedule this appointment.    . Providers on your designated Care Team:   . Murray Hodgkins, NP . Christell Faith, PA-C . Marrianne Mood, PA-C  Any Other Special Instructions Will Be Listed Below (If Applicable).  For educational health videos Log in to : [email or web address] Or : [email or web address], password : [email or web address]

## 2018-10-22 NOTE — Telephone Encounter (Signed)
Patient was prescribed Protonix 40 MG tab while in the hospital, recently. He is requesting a refill at this time to be sent to his local pharmacy, [facility].  He is not in need of the Florinef at this time as 3 refills were provided and available at the pharmacy. LOV 09/30/18 Routing to PCP for new order.  Protonix 40 MG tabs #30 with no refills was printed for patient on 09/24/18

## 2018-10-22 NOTE — Telephone Encounter (Signed)
Copied from [place] [phone]. Topic: Quick Communication - Rx Refill/Question >> Oct 22, 2018  1:54 PM [place], [place] D wrote: Medication: pantoprazole (PROTONIX) 40 MG tablet / fludrocortisone (FLORINEF) 0.1 MG tablet / PT's wife requesting refills of rxs. Stated that they were prescribed when pt went to the hospital. Please advise.  Has the patient contacted their pharmacy? Yes.  Pt stated the pharmacy informed her to call PCP for refills (Agent: If no, request that the patient contact the pharmacy for the refill.) (Agent: If yes, when and what did the pharmacy advise?)  Preferred Pharmacy (with phone number or street name): [place], [place] - [phone] (Phone) [phone] (Fax)    Agent: Please be advised that RX refills may take up to 3 business days. We ask that you follow-up with your pharmacy.

## 2018-10-23 ENCOUNTER — Other Ambulatory Visit: Payer: Self-pay

## 2018-10-23 ENCOUNTER — Encounter: Payer: Self-pay | Admitting: Oncology

## 2018-10-23 ENCOUNTER — Inpatient Hospital Stay: Payer: Medicare HMO

## 2018-10-23 ENCOUNTER — Inpatient Hospital Stay: Payer: Medicare HMO | Attending: Oncology | Admitting: Oncology

## 2018-10-23 VITALS — BP 137/89 | HR 82 | Temp 98.0°F | Resp 16 | Ht 76.0 in | Wt 198.4 lb

## 2018-10-23 DIAGNOSIS — I129 Hypertensive chronic kidney disease with stage 1 through stage 4 chronic kidney disease, or unspecified chronic kidney disease: Secondary | ICD-10-CM | POA: Diagnosis not present

## 2018-10-23 DIAGNOSIS — D5912 Cold autoimmune hemolytic anemia: Secondary | ICD-10-CM

## 2018-10-23 DIAGNOSIS — I951 Orthostatic hypotension: Secondary | ICD-10-CM | POA: Insufficient documentation

## 2018-10-23 DIAGNOSIS — D591 Other autoimmune hemolytic anemias: Secondary | ICD-10-CM

## 2018-10-23 DIAGNOSIS — G8929 Other chronic pain: Secondary | ICD-10-CM | POA: Insufficient documentation

## 2018-10-23 DIAGNOSIS — R5383 Other fatigue: Secondary | ICD-10-CM | POA: Diagnosis not present

## 2018-10-23 DIAGNOSIS — N189 Chronic kidney disease, unspecified: Secondary | ICD-10-CM | POA: Diagnosis not present

## 2018-10-23 DIAGNOSIS — M549 Dorsalgia, unspecified: Secondary | ICD-10-CM

## 2018-10-23 LAB — COMPREHENSIVE METABOLIC PANEL
ALT: 15 U/L (ref 0–44)
AST: 27 U/L (ref 15–41)
Albumin: 4 g/dL (ref 3.5–5.0)
Alkaline Phosphatase: 26 U/L — ABNORMAL LOW (ref 38–126)
Anion gap: 7 (ref 5–15)
BUN: 22 mg/dL (ref 8–23)
CO2: 29 mmol/L (ref 22–32)
Calcium: 9.2 mg/dL (ref 8.9–10.3)
Chloride: 105 mmol/L (ref 98–111)
Creatinine, Ser: 1.34 mg/dL — ABNORMAL HIGH (ref 0.61–1.24)
GFR calc Af Amer: 55 mL/min — ABNORMAL LOW (ref >60)
GFR calc non Af Amer: 48 mL/min — ABNORMAL LOW (ref >60)
Glucose, Bld: 150 mg/dL — ABNORMAL HIGH (ref 70–99)
Potassium: 3.4 mmol/L — ABNORMAL LOW (ref 3.5–5.1)
SODIUM: 141 mmol/L (ref 135–145)
Total Bilirubin: 1.9 mg/dL — ABNORMAL HIGH (ref 0.3–1.2)
Total Protein: 6.6 g/dL (ref 6.5–8.1)

## 2018-10-23 LAB — CBC WITH DIFFERENTIAL/PLATELET
Abs Immature Granulocytes: 0.08 10*3/uL — ABNORMAL HIGH (ref 0.00–0.07)
Basophils Absolute: 0 10*3/uL (ref 0.0–0.1)
Basophils Relative: 1 %
Eosinophils Absolute: 0 10*3/uL (ref 0.0–0.5)
Eosinophils Relative: 1 %
HCT: 31 % — ABNORMAL LOW (ref 39.0–52.0)
Hemoglobin: 10.5 g/dL — ABNORMAL LOW (ref 13.0–17.0)
Immature Granulocytes: 1 %
Lymphocytes Relative: 19 %
Lymphs Abs: 1.4 10*3/uL (ref 0.7–4.0)
MCH: 35.2 pg — ABNORMAL HIGH (ref 26.0–34.0)
MCHC: 33.9 g/dL (ref 30.0–36.0)
MCV: 104 fL — ABNORMAL HIGH (ref 80.0–100.0)
Monocytes Absolute: 0.4 10*3/uL (ref 0.1–1.0)
Monocytes Relative: 6 %
Neutro Abs: 5.5 10*3/uL (ref 1.7–7.7)
Neutrophils Relative %: 72 %
Platelets: 267 10*3/uL (ref 150–400)
RBC: 2.98 MIL/uL — ABNORMAL LOW (ref 4.22–5.81)
RDW: 14 % (ref 11.5–15.5)
WBC: 7.5 10*3/uL (ref 4.0–10.5)
nRBC: 0 % (ref 0.0–0.2)

## 2018-10-23 LAB — RETICULOCYTES
Immature Retic Fract: 22.7 % — ABNORMAL HIGH (ref 2.3–15.9)
RBC.: 2.98 MIL/uL — ABNORMAL LOW (ref 4.22–5.81)
Retic Count, Absolute: 115.9 10*3/uL (ref 19.0–186.0)
Retic Ct Pct: 3.9 % — ABNORMAL HIGH (ref 0.4–3.1)

## 2018-10-23 NOTE — Progress Notes (Signed)
Patient here for follow up he was hospitalized last week for hypotension. Wife reports that he has been staying in bed most of the time since January 1.

## 2018-10-24 ENCOUNTER — Other Ambulatory Visit: Payer: Self-pay | Admitting: Family Medicine

## 2018-10-25 LAB — HAPTOGLOBIN: Haptoglobin: <10 mg/dL — ABNORMAL LOW (ref 38–329)

## 2018-10-26 NOTE — Progress Notes (Signed)
Hematology/Oncology Consult note [facility]  [phone] [phone]  Patient Care Team: [name], MD as PCP - General (Family Medicine) [name], MD as PCP - Cardiology (Cardiology) Bary Castilla Forest Gleason, MD as Consulting Physician (General Surgery) [name], MD as Referring Physician (Internal Medicine) [name], MD as Consulting Physician (Cardiology)   Name of the patient: William Taylor  [id number]  26-Oct-1929   Date of visit: 10/26/18  Diagnosis- cold agglutinin hemolytic anemia   Chief complaint/ Reason for visit-routine follow-up of cold agglutinin hemolytic anemia  Heme/Onc history: patient is a 85 year old male with a past medical history significant for prostate cancer for which he follows up with urology. He also has chronic back pain,hypertension and stage III CKD hyperlipidemia and A. fib among other medical problems. He has been referred to [place] for evaluation and management of anemia. Recent CBC from 02/16/2018 showed white count of 6.1, H&H of 10.7/30.8 with an MCV of 99.9. He was found to have coldagglutinin inhis peripheral blood. TSH was within normal limits.B12 and folate were within normal limits.His hemoglobin was 10.7 about 4 months ago but about 1 year ago his hemoglobin was 12.9.  Patient reports ongoing fatigue over the last 1 year. His appetite is good and his weight has been stable. He denies any lumps or bumps anywhere or drenching night sweats. Patient does report ongoing left-sided groin pain. He did have an ultrasound which did not reveal any adenopathy or mass. Also reports chronic back pain but denies any other acute joint pain or joint swelling. No skin rash.  Results of blood work from 03/05/2018 were as follows: CBC showed white count of 6.5, H&H of 11.9/35 with an MCV of 101 and a platelet count of 228. CMP was normal except for a mildly elevated creatinine  of 1.3. LDH was normal at 122 reticulocyte count was low normal at 1.9. Multiple myeloma panel did not reveal any M protein serum immunofixation was normal. Free light chain showed mildly elevated kappa of 23.8 with a normal kappa lambda light chain ratio of 1.5. Haptoglobin was low at 21 cold agglutinin titer was positive at 1 is to 1024 Coombs test was positive for complement and negative for IgG smear review showed macrocytic anemia with rouleaux formation. Negative for schistocytes iron studies were within normal limits  CT chest abdomen and pelvis in November 2019 showed nonspecific 4 mm left lower lobe lung nodule. Prostatomegaly. No evidence of malignancy   Interval history-patient has been having problems with refractory orthostatic hypotension which has been making it difficult for him to ambulate.  He follows up with cardiology for this and has been on multiple medications recently which have not helped him significantly.  He continues to feel fatigued.  Denies other complaints  ECOG PS- 2 Pain scale- 0   Review of systems- Review of Systems  Constitutional: Positive for malaise/fatigue. Negative for chills, fever and weight loss.  HENT: Negative for congestion, ear discharge and nosebleeds.   Eyes: Negative for blurred vision.  Respiratory: Negative for cough, hemoptysis, sputum production, shortness of breath and wheezing.   Cardiovascular: Negative for chest pain, palpitations, orthopnea and claudication.  Gastrointestinal: Negative for abdominal pain, blood in stool, constipation, diarrhea, heartburn, melena, nausea and vomiting.  Genitourinary: Negative for dysuria, flank pain, frequency, hematuria and urgency.  Musculoskeletal: Negative for back pain, joint pain and myalgias.  Skin: Negative for rash.  Neurological: Positive for dizziness. Negative for tingling, focal weakness,  seizures, weakness and headaches.  Endo/Heme/Allergies: Does not bruise/bleed easily.    Psychiatric/Behavioral: Negative for depression and suicidal ideas. The patient does not have insomnia.       Allergies  Allergen Reactions  . Bee Venom Swelling  . Sulfasalazine Hives and Swelling  . Sulfa Antibiotics Hives and Swelling  . Warfarin And Related Other (See Comments)    Chest pain     Past Medical History:  Diagnosis Date  . Cancer [facility])    Prostate, followed by Dr. Jacqlyn Larsen  . Colon polyp   . Coronary artery disease   . Hyperlipidemia   . Hypertension   . MI (myocardial infarction) ([place])    2015  . PAF (paroxysmal atrial fibrillation) ([place])    a. on xarelto  . Skin cancer      Past Surgical History:  Procedure Laterality Date  . CARDIAC CATHETERIZATION  10/2013   [facility]  . CATARACT EXTRACTION  Oct. 3, 2012   right eye  . colonoscopy    . COLONOSCOPY W/ POLYPECTOMY  2015   Dr Rayann Heman  . COLONOSCOPY WITH PROPOFOL N/A 08/15/2016   Procedure: COLONOSCOPY WITH PROPOFOL;  Surgeon: [name], MD;  Location: [facility] ENDOSCOPY;  Service: Endoscopy;  Laterality: N/A;  . CORONARY ANGIOPLASTY  2009   2005; s/p stent  . ENTEROSCOPY N/A 09/22/2018   Procedure: ENTEROSCOPY;  Surgeon: Lin Landsman, MD;  Location: [place];  Service: Gastroenterology;  Laterality: N/A;  . ESOPHAGOGASTRODUODENOSCOPY (EGD) WITH PROPOFOL N/A 08/15/2016   Procedure: ESOPHAGOGASTRODUODENOSCOPY (EGD) WITH PROPOFOL;  Surgeon: [name], MD;  Location: [facility] ENDOSCOPY;  Service: Endoscopy;  Laterality: N/A;  . GIVENS CAPSULE STUDY N/A 09/26/2016   Procedure: GIVENS CAPSULE STUDY;  Surgeon: [name], MD;  Location: [facility] ENDOSCOPY;  Service: Endoscopy;  Laterality: N/A;  . HERNIA REPAIR  2013  . LUMBAR SPINE SURGERY    . UPPER GI ENDOSCOPY  Sept 2015   Dr Rayann Heman    Social History   Socioeconomic History  . Marital status: Married    Spouse name: Not on file  . Number of children: Not on file  . Years of education: Not on file  . Highest education level: Not on file  Occupational  History  . Not on file  Social Needs  . Financial resource strain: Not on file  . Food insecurity:    Worry: Not on file    Inability: Not on file  . Transportation needs:    Medical: Not on file    Non-medical: Not on file  Tobacco Use  . Smoking status: Never Smoker  . Smokeless tobacco: Never Used  Substance and Sexual Activity  . Alcohol use: No  . Drug use: No  . Sexual activity: Not Currently  Lifestyle  . Physical activity:    Days per week: Not on file    Minutes per session: Not on file  . Stress: Not on file  Relationships  . Social connections:    Talks on phone: Not on file    Gets together: Not on file    Attends religious service: Not on file    Active member of club or organization: Not on file    Attends meetings of clubs or organizations: Not on file    Relationship status: Not on file  . Intimate partner violence:    Fear of current or ex partner: Not on file    Emotionally abused: Not on file    Physically abused: Not on file    Forced  sexual activity: Not on file  Other Topics Concern  . Not on file  Social History Narrative  . Not on file    Family History  Problem Relation Age of Onset  . Stroke Mother   . Colon cancer Father   . Coronary artery disease Brother   . Other Brother 38       lymes dz     Current Outpatient Medications:  .  amiodarone (PACERONE) 200 MG tablet, Take 1 tablet (200 mg total) by mouth daily., Disp: 90 tablet, Rfl: 0 .  cyproheptadine (PERIACTIN) 4 MG tablet, Take 1 tablet (4 mg total) by mouth 3 (three) times daily as needed for allergies., Disp: 90 tablet, Rfl: 3 .  Droxidopa (NORTHERA) 100 MG CAPS, Take 600 mg by mouth 3 (three) times daily., Disp: 540 capsule, Rfl: 6 .  feeding supplement, ENSURE ENLIVE, (ENSURE ENLIVE) LIQD, Take 237 mLs by mouth daily., Disp: 90 Bottle, Rfl: 0 .  fenofibrate micronized (ANTARA) 130 MG capsule, TAKE 1 CAPSULE BY MOUTH  DAILY BEFORE BREAKFAST, Disp: 90 capsule, Rfl: 1 .   finasteride (PROSCAR) 5 MG tablet, Take 1 tablet (5 mg total) by mouth daily., Disp: 90 tablet, Rfl: 2 .  fludrocortisone (FLORINEF) 0.1 MG tablet, Take 1 tablet (0.1 mg total) by mouth daily., Disp: 90 tablet, Rfl: 3 .  levobunolol (BETAGAN) 0.5 % ophthalmic solution, Place 1 drop into the right eye 2 (two) times daily. , Disp: , Rfl:  .  levothyroxine (SYNTHROID, LEVOTHROID) 50 MCG tablet, Take 1 tablet (50 mcg total) by mouth daily at 6 (six) AM., Disp: 90 tablet, Rfl: 0 .  nitroGLYCERIN (NITROSTAT) 0.4 MG SL tablet, DISSOLVE UNDER THE TONGUE 1 TABLET EVERY 5 MINUTES AS NEEDED FOR CHEST PAIN, Disp: 25 tablet, Rfl: 3 .  Omega-3 Fatty Acids (FISH OIL) 1000 MG CAPS, Take 1,000 mg by mouth daily., Disp: , Rfl:  .  ondansetron (ZOFRAN-ODT) 8 MG disintegrating tablet, Take 0.5 tablets (4 mg total) by mouth every 8 (eight) hours as needed for nausea., Disp: 20 tablet, Rfl: 1 .  pantoprazole (PROTONIX) 40 MG tablet, Take 1 tablet (40 mg total) by mouth daily., Disp: 30 tablet, Rfl: 0 .  polyethylene glycol powder (GLYCOLAX/MIRALAX) powder, Take 17 g by mouth 2 (two) times daily as needed., Disp: 3350 g, Rfl: 1 .  pravastatin (PRAVACHOL) 40 MG tablet, TAKE 1 TABLET BY MOUTH AT  BEDTIME, Disp: 90 tablet, Rfl: 3 .  ranolazine (RANEXA) 500 MG 12 hr tablet, Take 1 tablet (500 mg total) by mouth 2 (two) times daily., Disp: 60 tablet, Rfl: 3 .  tamsulosin (FLOMAX) 0.4 MG CAPS capsule, Take 0.4 mg by mouth as needed., Disp: , Rfl:  .  XARELTO 20 MG TABS tablet, TAKE 1 TABLET DAILY, Disp: 90 tablet, Rfl: 1 .  midodrine (PROAMATINE) 10 MG tablet, Take 0.5-1 tablets (5-10 mg total) by mouth 3 (three) times daily as needed (As needed for dizziness or low blood pressures). (Patient not taking: Reported on 10/23/2018), Disp: 90 tablet, Rfl: 6  Physical exam:  Vitals:   10/23/18 1124 10/23/18 1131  BP:  137/89  Pulse:  82  Resp: 16   Temp:  98 F (36.7 C)  TempSrc:  Tympanic  Weight: 198 lb 6.4 oz (90 kg)     Height: '6\' 4"'$  (1.93 m)    Physical Exam Constitutional:      Comments: Elderly gentleman sitting in a chair.  Appears in no acute distress  HENT:  Head: Normocephalic and atraumatic.  Eyes:     Pupils: Pupils are equal, round, and reactive to light.  Neck:     Musculoskeletal: Normal range of motion.  Cardiovascular:     Rate and Rhythm: Normal rate and regular rhythm.     Heart sounds: Normal heart sounds.  Pulmonary:     Effort: Pulmonary effort is normal.     Breath sounds: Normal breath sounds.  Abdominal:     General: Bowel sounds are normal.     Palpations: Abdomen is soft.  Skin:    General: Skin is warm and dry.  Neurological:     Mental Status: He is alert and oriented to person, place, and time.      CMP Latest Ref Rng & Units 10/23/2018  Glucose 70 - 99 mg/dL 150(H)  BUN 8 - 23 mg/dL 22  Creatinine 0.61 - 1.24 mg/dL 1.34(H)  Sodium 135 - 145 mmol/L 141  Potassium 3.5 - 5.1 mmol/L 3.4(L)  Chloride 98 - 111 mmol/L 105  CO2 22 - 32 mmol/L 29  Calcium 8.9 - 10.3 mg/dL 9.2  Total Protein 6.5 - 8.1 g/dL 6.6  Total Bilirubin 0.3 - 1.2 mg/dL 1.9(H)  Alkaline Phos 38 - 126 U/L 26(L)  AST 15 - 41 U/L 27  ALT 0 - 44 U/L 15   CBC Latest Ref Rng & Units 10/23/2018  WBC 4.0 - 10.5 K/uL 7.5  Hemoglobin 13.0 - 17.0 g/dL 10.5(L)  Hematocrit 39.0 - 52.0 % 31.0(L)  Platelets 150 - 400 K/uL 267      Assessment and plan- Patient is a 85 y.o. male with cold agglutinin hemolytic anemia here for routine follow-up  Patient's hemoglobin has remained between 10-11 over the last 1 year.  He continues to have mild degree of hemolysis given his elevated reticulocyte count, low haptoglobin as well as elevated bilirubin of 1.9.  However given his stability of hemoglobin I would not like to offer him any treatment like single agent Rituxan unless there are findings of worsening anemia given his age and ongoing symptoms of orthostatic hypotension as well.  Repeat CBC, CMP,  reticulocyte and haptoglobin in 3 in 6 months and I will see him back in 6 months.   Visit Diagnosis 1. Cold agglutinin disease ([place])      Dr. Randa Evens, MD, MPH [facility] [phone] 10/26/2018 11:00 AM

## 2018-10-27 NOTE — Telephone Encounter (Signed)
This was sent to the patient's pharmacy yesterday.

## 2018-10-28 DIAGNOSIS — R42 Dizziness and giddiness: Secondary | ICD-10-CM | POA: Diagnosis not present

## 2018-10-28 DIAGNOSIS — E538 Deficiency of other specified B group vitamins: Secondary | ICD-10-CM | POA: Diagnosis not present

## 2018-10-28 DIAGNOSIS — G903 Multi-system degeneration of the autonomic nervous system: Secondary | ICD-10-CM | POA: Diagnosis not present

## 2018-10-28 DIAGNOSIS — R29898 Other symptoms and signs involving the musculoskeletal system: Secondary | ICD-10-CM | POA: Diagnosis not present

## 2018-10-28 DIAGNOSIS — K30 Functional dyspepsia: Secondary | ICD-10-CM | POA: Diagnosis not present

## 2018-10-28 DIAGNOSIS — E559 Vitamin D deficiency, unspecified: Secondary | ICD-10-CM | POA: Diagnosis not present

## 2018-11-02 ENCOUNTER — Telehealth: Payer: Self-pay | Admitting: *Deleted

## 2018-11-02 MED ORDER — POTASSIUM CHLORIDE ER 10 MEQ PO TBCR: 10.0000 meq | EXTENDED_RELEASE_TABLET | Freq: Every day | ORAL | 3 refills | Status: DC

## 2018-11-02 NOTE — Telephone Encounter (Signed)
Called patient's wife to verify pharmacy. She said to send potassium to [facility]. She verbalized understanding on how to take the potassium once a day.

## 2018-11-02 NOTE — Telephone Encounter (Signed)
-----   Message from [name], MD sent at 11/02/2018  3:53 PM EDT ----- Regarding: potassium Can we send in potassium 10 meq once a day Thx TG

## 2018-11-05 ENCOUNTER — Other Ambulatory Visit: Payer: Self-pay | Admitting: Cardiovascular Disease

## 2018-11-06 DIAGNOSIS — R42 Dizziness and giddiness: Secondary | ICD-10-CM | POA: Diagnosis not present

## 2018-11-06 DIAGNOSIS — G903 Multi-system degeneration of the autonomic nervous system: Secondary | ICD-10-CM | POA: Diagnosis not present

## 2018-11-06 NOTE — Telephone Encounter (Signed)
Please review for refill. Thanks!  

## 2018-11-09 ENCOUNTER — Telehealth: Payer: Self-pay | Admitting: Gastroenterology

## 2018-11-09 NOTE — Telephone Encounter (Signed)
Discussed with William Taylor : long standing nausea not any better- no other symptoms. On PPI once a day - he may have gastroparesis and GERD, suggested to try BID PPI and call me in 3-4 days with an update.   Dr [name] MD,MRCP [facility]) Gastroenterology/Hepatology Pager: [phone]

## 2018-11-09 NOTE — Telephone Encounter (Signed)
Pt wife is calling regarding pt he is nausea he can not eat he is very sick please call pt  cb [phone]

## 2018-11-09 NOTE — Telephone Encounter (Signed)
Spoke with pt spouse Ivin Booty, she's very concerned.  Pt is experiencing nausea, has dry heaves and sweats, only able to eat small portions,and losing weight. No constipation, no diarrhea. I explained that I will relay this information to Dr. [name] and then advise.

## 2018-11-12 ENCOUNTER — Other Ambulatory Visit: Payer: Self-pay | Admitting: Neurology

## 2018-11-12 DIAGNOSIS — R42 Dizziness and giddiness: Secondary | ICD-10-CM

## 2018-11-13 NOTE — Telephone Encounter (Signed)
Pt wife is calling to give update on pt he  is still not better  He has dry heat still feels like food gets stuck not going down, pt  Does not want to eat because he feels sick afterwards .( pt has a raw spot on split on lower back from sitting pt wife wants advise on that she states she has been putting aquafor on it) cb [phone]

## 2018-11-16 ENCOUNTER — Ambulatory Visit
Admission: RE | Admit: 2018-11-16 | Discharge: 2018-11-16 | Disposition: A | Discharge status: Home / Self Care | Payer: Medicare HMO | Source: Ambulatory Visit | Attending: Neurology | Admitting: Neurology

## 2018-11-16 ENCOUNTER — Other Ambulatory Visit: Payer: Self-pay

## 2018-11-16 DIAGNOSIS — R42 Dizziness and giddiness: Secondary | ICD-10-CM | POA: Diagnosis not present

## 2018-11-16 MED ORDER — PANTOPRAZOLE SODIUM 40 MG PO TBEC: 40.0000 mg | DELAYED_RELEASE_TABLET | Freq: Two times a day (BID) | ORAL | 2 refills | Status: DC

## 2018-11-16 NOTE — Telephone Encounter (Signed)
Spoke to his wife Ivin Booty, she is concerned with his poor apetite , dry heaving. Not really throwing up. Feels lethargic .   I explained based on her description may be related to autonomic dysfunction ?Shy Dragers syndrome . No specific recs can be provided from my end . Recent EGD is normal. The autonomic dysfunction can affect the GI tract and cause slow motility, dysphagia.   Sherald Hess - can you provide a refill on Protonix and increase to BID  He also wanted refills on the Fludrocortisone- can you liase with his doctor to see if he can have them .  To Dr Caryl Bis and Dr Manuella Ghazi   Any role you think in marinol ?? To help with appetite.   Dr [name] MD,MRCP [facility]) Gastroenterology/Hepatology Pager: [phone]

## 2018-11-16 NOTE — Telephone Encounter (Signed)
Spoke with pt wife Ivin Booty, to verify pt preferred pharmacy for the prescription refills. Pt wife stated pt will only need the protonix refilled, she realized pt has 6 refills remaining on the fludrocortisone.

## 2018-11-17 ENCOUNTER — Telehealth: Payer: Self-pay | Admitting: Cardiovascular Disease

## 2018-11-17 ENCOUNTER — Encounter: Payer: Self-pay | Admitting: Cardiovascular Disease

## 2018-11-17 NOTE — Telephone Encounter (Signed)
error 

## 2018-11-17 NOTE — Telephone Encounter (Signed)
  Wife is calling because Mr Muse is not feeling well and she wants to speak with Dr Rockey Situ about what can be done. She is not sure if he needs an appt or not

## 2018-11-17 NOTE — Telephone Encounter (Signed)
Spoke with wife, ok per DPR. States patient has been hardly able to get out of bed in 3 days.  He is lightheaded when he stands up. Since beginning of year, Weight down to 195 lb from 223 lb. Per wife, Dr [name] feels like Dr Rockey Situ is in control of medications and is managing things. Patient still feels sick from nausea, abdominal discomfort, and dry heaving (began first of the year 2020). She has questions about what they are to do now. Schedule evisit with patient tomorrow.  PLEASE CALL HOME NUMBER LISTED. [phone]     Virtual Visit Pre-Appointment Phone Call  Steps For Call:  1. Confirm consent - "In the setting of the current Covid19 crisis, you are scheduled for a (phone or video) visit with your provider on (date) at (time).  Just as we do with many in-office visits, in order for you to participate in this visit, we must obtain consent.  If you'd like, I can send this to your mychart (if signed up) or email for you to review.  Otherwise, I can obtain your verbal consent now.  All virtual visits are billed to your insurance company just like a normal visit would be.  By agreeing to a virtual visit, we'd like you to understand that the technology does not allow for your provider to perform an examination, and thus may limit your provider's ability to fully assess your condition.  Finally, though the technology is pretty good, we cannot assure that it will always work on either your or our end, and in the setting of a video visit, we may have to convert it to a phone-only visit.  In either situation, we cannot ensure that we have a secure connection.  Are you willing to proceed?"  2. Give patient instructions for WebEx download to smartphone as below if video visit  3. Advise patient to be prepared with any vital sign or heart rhythm information, their current medicines, and a piece of paper and pen handy for any instructions they may receive the day of their visit  4. Inform patient  they will receive a phone call 15 minutes prior to their appointment time (may be from unknown caller ID) so they should be prepared to answer  5. Confirm that appointment type is correct in Epic appointment notes (video vs telephone)    TELEPHONE CALL NOTE  William Taylor has been deemed a candidate for a follow-up tele-health visit to limit community exposure during the Covid-19 pandemic. I spoke with the patient via phone to ensure availability of phone/video source, confirm preferred email & phone number, and discuss instructions and expectations.  I reminded William Taylor to be prepared with any vital sign and/or heart rhythm information that could potentially be obtained via home monitoring, at the time of his visit. I reminded William Taylor to expect a phone call at the time of his visit if his visit.  Did the patient verbally acknowledge consent to treatment? yes  Roney Jaffe, RN 11/17/2018 2:54 PM   DOWNLOADING THE [place] TO SMARTPHONE  - If Apple, go to [employer] and type in WebEx in the search bar. [place] [employer], the blue/green circle. The app is free but as with any other app downloads, their phone may require them to verify saved payment information or Apple password. The patient does NOT have to create an account.  - If Android, ask patient to go to [employer] and type in [employer]  in the search bar. [place] [employer], the blue/green circle. The app is free but as with any other app downloads, their phone may require them to verify saved payment information or Android password. The patient does NOT have to create an account.   CONSENT FOR TELE-HEALTH VISIT - PLEASE REVIEW  I hereby voluntarily request, consent and authorize [facility] and its employed or contracted physicians, physician assistants, nurse practitioners or other licensed health care professionals (the Practitioner), to provide me with telemedicine  health care services (the "Services") as deemed necessary by the treating Practitioner. I acknowledge and consent to receive the Services by the Practitioner via telemedicine. I understand that the telemedicine visit will involve communicating with the Practitioner through live audiovisual communication technology and the disclosure of certain medical information by electronic transmission. I acknowledge that I have been given the opportunity to request an in-person assessment or other available alternative prior to the telemedicine visit and am voluntarily participating in the telemedicine visit.  I understand that I have the right to withhold or withdraw my consent to the use of telemedicine in the course of my care at any time, without affecting my right to future care or treatment, and that the Practitioner or I may terminate the telemedicine visit at any time. I understand that I have the right to inspect all information obtained and/or recorded in the course of the telemedicine visit and may receive copies of available information for a reasonable fee.  I understand that some of the potential risks of receiving the Services via telemedicine include:  Marland Kitchen Delay or interruption in medical evaluation due to technological equipment failure or disruption; . Information transmitted may not be sufficient (e.g. poor resolution of images) to allow for appropriate medical decision making by the Practitioner; and/or  . In rare instances, security protocols could fail, causing a breach of personal health information.  Furthermore, I acknowledge that it is my responsibility to provide information about my medical history, conditions and care that is complete and accurate to the best of my ability. I acknowledge that Practitioner's advice, recommendations, and/or decision may be based on factors not within their control, such as incomplete or inaccurate data provided by me or distortions of diagnostic images or  specimens that may result from electronic transmissions. I understand that the practice of medicine is not an exact science and that Practitioner makes no warranties or guarantees regarding treatment outcomes. I acknowledge that I will receive a copy of this consent concurrently upon execution via email to the email address I last provided but may also request a printed copy by calling the office of [place].    I understand that my insurance will be billed for this visit.   I have read or had this consent read to me. . I understand the contents of this consent, which adequately explains the benefits and risks of the Services being provided via telemedicine.  . I have been provided ample opportunity to ask questions regarding this consent and the Services and have had my questions answered to my satisfaction. . I give my informed consent for the services to be provided through the use of telemedicine in my medical care  By participating in this telemedicine visit I agree to the above.

## 2018-11-18 ENCOUNTER — Telehealth (INDEPENDENT_AMBULATORY_CARE_PROVIDER_SITE_OTHER): Payer: Medicare HMO | Admitting: Cardiovascular Disease

## 2018-11-18 ENCOUNTER — Other Ambulatory Visit: Payer: Self-pay

## 2018-11-18 DIAGNOSIS — Z79899 Other long term (current) drug therapy: Secondary | ICD-10-CM | POA: Diagnosis not present

## 2018-11-18 DIAGNOSIS — I4891 Unspecified atrial fibrillation: Secondary | ICD-10-CM

## 2018-11-18 DIAGNOSIS — N183 Chronic kidney disease, stage 3 unspecified: Secondary | ICD-10-CM

## 2018-11-18 DIAGNOSIS — I509 Heart failure, unspecified: Secondary | ICD-10-CM | POA: Diagnosis not present

## 2018-11-18 DIAGNOSIS — E782 Mixed hyperlipidemia: Secondary | ICD-10-CM

## 2018-11-18 DIAGNOSIS — D509 Iron deficiency anemia, unspecified: Secondary | ICD-10-CM | POA: Diagnosis not present

## 2018-11-18 DIAGNOSIS — I480 Paroxysmal atrial fibrillation: Secondary | ICD-10-CM

## 2018-11-18 DIAGNOSIS — I2510 Atherosclerotic heart disease of native coronary artery without angina pectoris: Secondary | ICD-10-CM | POA: Diagnosis not present

## 2018-11-18 DIAGNOSIS — G4700 Insomnia, unspecified: Secondary | ICD-10-CM

## 2018-11-18 DIAGNOSIS — Z7901 Long term (current) use of anticoagulants: Secondary | ICD-10-CM | POA: Diagnosis not present

## 2018-11-18 DIAGNOSIS — I951 Orthostatic hypotension: Secondary | ICD-10-CM | POA: Diagnosis not present

## 2018-11-18 DIAGNOSIS — E440 Moderate protein-calorie malnutrition: Secondary | ICD-10-CM

## 2018-11-18 MED ORDER — MIDODRINE HCL 5 MG PO TABS: ORAL_TABLET | ORAL | 3 refills | Status: DC

## 2018-11-18 MED ORDER — MIDODRINE HCL 10 MG PO TABS: 10.0000 mg | ORAL_TABLET | Freq: Three times a day (TID) | ORAL | 3 refills | Status: DC

## 2018-11-18 NOTE — Progress Notes (Addendum)
Virtual Visit via Video Note   This visit type was conducted due to national recommendations for restrictions regarding the COVID-19 Pandemic (e.g. social distancing) in an effort to limit this patient's exposure and mitigate transmission in our community.  Due to her co-morbid illnesses, this patient is at least at moderate risk for complications without adequate follow up.  This format is felt to be most appropriate for this patient at this time.  All issues noted in this document were discussed and addressed.  A limited physical exam was performed with this format.  Please refer to the patient's chart for her consent to telehealth for [facility].    Date:  11/18/2018   ID:  William Taylor, DOB [birth year], MRN [id number]  Patient Location:  [place] [address]   Provider location:   [facility], [place] office  PCP:  [name], MD  Cardiologist:  Ida Rogue, MD   Chief Complaint:  Dizzy, low blood pressure    History of Present Illness:    William Taylor is a 85 y.o. male who presents via audio/video conferencing for a telehealth visit today.   The patient does not symptoms concerning for COVID-19 infection (fever, chills, cough, or new SHORTNESS OF BREATH).  Please refer to prior office visit for complete details:  Patient has a past medical history of coronary artery disease, stent in 2005,  atrial fibrillation with RVR in the setting of UTI September 2011,  stent placement May 2012 to the OM 2,  PE following catheterization started on anticoagulation, atrial fibrillation in 2013,  Last atrial fibrillation July 2018  atypical chest pain July 2018 History of anemia who presents for follow-up of his coronary disease and atrial fibrillation  taking the Northera 600 mg 3 times daily  on Florinef daily.  Midodrine held On his last clinic visit for hypertension  On today's discussion, he has recurrence of his orthostasis No  falls Gets up at 6:30 AM takes his Northera with water and cracker then goes back to bed until 10 AM During the daytime very symptomatic, not eating well secondary to chronic abdominal discomfort Notes his blood pressure is running low as his " rings fall off when he stands up" He feels the rings falling off is a sign of his low blood pressure Very dizzy standing up, better when he lays down or sits  Symptoms very bad recently, In bed for a few days, Sitting: 120s/80, on today's discussion  Stomach problems, chronic issue, worse with low blood pressure Weight 190, down 7.5 pounds since February 2020 Difficulty getting anything down   Prior CV studies:   The following studies were reviewed today:    Past Medical History:  Diagnosis Date  . Cancer [facility])    Prostate, followed by Dr. Jacqlyn Larsen  . Colon polyp   . Coronary artery disease   . Hyperlipidemia   . Hypertension   . MI (myocardial infarction) ([place])    2015  . PAF (paroxysmal atrial fibrillation) ([place])    a. on xarelto  . Skin cancer    Past Surgical History:  Procedure Laterality Date  . CARDIAC CATHETERIZATION  10/2013   [facility]  . CATARACT EXTRACTION  Oct. 3, 2012   right eye  . colonoscopy    . COLONOSCOPY W/ POLYPECTOMY  2015   Dr Rayann Heman  . COLONOSCOPY WITH PROPOFOL N/A 08/15/2016   Procedure: COLONOSCOPY WITH PROPOFOL;  Surgeon: [name], MD;  Location: [facility];  Service: Endoscopy;  Laterality: N/A;  . CORONARY ANGIOPLASTY  2009   2005; s/p stent  . ENTEROSCOPY N/A 09/22/2018   Procedure: ENTEROSCOPY;  Surgeon: Lin Landsman, MD;  Location: [place];  Service: Gastroenterology;  Laterality: N/A;  . ESOPHAGOGASTRODUODENOSCOPY (EGD) WITH PROPOFOL N/A 08/15/2016   Procedure: ESOPHAGOGASTRODUODENOSCOPY (EGD) WITH PROPOFOL;  Surgeon: [name], MD;  Location: [facility] ENDOSCOPY;  Service: Endoscopy;  Laterality: N/A;  . GIVENS CAPSULE STUDY N/A 09/26/2016   Procedure: GIVENS CAPSULE STUDY;  Surgeon: [name], MD;  Location: [facility] ENDOSCOPY;  Service: Endoscopy;  Laterality: N/A;  . HERNIA REPAIR  2013  . LUMBAR SPINE SURGERY    . UPPER GI ENDOSCOPY  Sept 2015   Dr Rayann Heman     Current Meds  Medication Sig  . amiodarone (PACERONE) 200 MG tablet Take 1 tablet (200 mg total) by mouth daily.  . cyproheptadine (PERIACTIN) 4 MG tablet Take 1 tablet (4 mg total) by mouth 3 (three) times daily as needed for allergies.  . Droxidopa (NORTHERA) 100 MG CAPS Take 600 mg by mouth 3 (three) times daily.  . feeding supplement, ENSURE ENLIVE, (ENSURE ENLIVE) LIQD Take 237 mLs by mouth daily.  . fenofibrate micronized (ANTARA) 130 MG capsule TAKE 1 CAPSULE BY MOUTH  DAILY BEFORE BREAKFAST  . finasteride (PROSCAR) 5 MG tablet Take 1 tablet (5 mg total) by mouth daily.  . fludrocortisone (FLORINEF) 0.1 MG tablet Take 1 tablet (0.1 mg total) by mouth daily.  Marland Kitchen levobunolol (BETAGAN) 0.5 % ophthalmic solution Place 1 drop into the right eye 2 (two) times daily.   Marland Kitchen levothyroxine (SYNTHROID, LEVOTHROID) 50 MCG tablet Take 1 tablet (50 mcg total) by mouth daily at 6 (six) AM.  . midodrine (PROAMATINE) 10 MG tablet Take 0.5-1 tablets (5-10 mg total) by mouth 3 (three) times daily as needed (As needed for dizziness or low blood pressures).  . nitroGLYCERIN (NITROSTAT) 0.4 MG SL tablet DISSOLVE UNDER THE TONGUE 1 TABLET EVERY 5 MINUTES AS NEEDED FOR CHEST PAIN  . Omega-3 Fatty Acids (FISH OIL) 1000 MG CAPS Take 1,000 mg by mouth daily.  . ondansetron (ZOFRAN-ODT) 8 MG disintegrating tablet Take 0.5 tablets (4 mg total) by mouth every 8 (eight) hours as needed for nausea.  . pantoprazole (PROTONIX) 40 MG tablet Take 1 tablet (40 mg total) by mouth 2 (two) times daily.  . polyethylene glycol powder (GLYCOLAX/MIRALAX) powder Take 17 g by mouth 2 (two) times daily as needed.  . potassium chloride (K-DUR) 10 MEQ tablet Take 1 tablet (10 mEq total) by mouth daily.  . pravastatin (PRAVACHOL) 40 MG tablet TAKE 1 TABLET BY MOUTH AT   BEDTIME  . ranolazine (RANEXA) 500 MG 12 hr tablet Take 1 tablet (500 mg total) by mouth 2 (two) times daily.  . tamsulosin (FLOMAX) 0.4 MG CAPS capsule Take 0.4 mg by mouth as needed.  . Vitamin D, Ergocalciferol, (DRISDOL) 1.25 MG (50000 UT) CAPS capsule Take by mouth.  Alveda Reasons 20 MG TABS tablet TAKE 1 TABLET BY MOUTH  DAILY     Allergies:   Bee venom; Sulfasalazine; Sulfa antibiotics; and Warfarin and related   Social History   Tobacco Use  . Smoking status: Never Smoker  . Smokeless tobacco: Never Used  Substance Use Topics  . Alcohol use: No  . Drug use: No     Current Outpatient Medications on File Prior to Visit  Medication Sig Dispense Refill  . amiodarone (PACERONE) 200 MG tablet Take 1 tablet (200 mg total)  by mouth daily. 90 tablet 0  . cyproheptadine (PERIACTIN) 4 MG tablet Take 1 tablet (4 mg total) by mouth 3 (three) times daily as needed for allergies. 90 tablet 3  . Droxidopa (NORTHERA) 100 MG CAPS Take 600 mg by mouth 3 (three) times daily. 540 capsule 6  . feeding supplement, ENSURE ENLIVE, (ENSURE ENLIVE) LIQD Take 237 mLs by mouth daily. 90 Bottle 0  . fenofibrate micronized (ANTARA) 130 MG capsule TAKE 1 CAPSULE BY MOUTH  DAILY BEFORE BREAKFAST 90 capsule 1  . finasteride (PROSCAR) 5 MG tablet Take 1 tablet (5 mg total) by mouth daily. 90 tablet 2  . fludrocortisone (FLORINEF) 0.1 MG tablet Take 1 tablet (0.1 mg total) by mouth daily. 90 tablet 3  . levobunolol (BETAGAN) 0.5 % ophthalmic solution Place 1 drop into the right eye 2 (two) times daily.     Marland Kitchen levothyroxine (SYNTHROID, LEVOTHROID) 50 MCG tablet Take 1 tablet (50 mcg total) by mouth daily at 6 (six) AM. 90 tablet 0  . midodrine (PROAMATINE) 10 MG tablet Take 0.5-1 tablets (5-10 mg total) by mouth 3 (three) times daily as needed (As needed for dizziness or low blood pressures). 90 tablet 6  . nitroGLYCERIN (NITROSTAT) 0.4 MG SL tablet DISSOLVE UNDER THE TONGUE 1 TABLET EVERY 5 MINUTES AS NEEDED FOR  CHEST PAIN 25 tablet 3  . Omega-3 Fatty Acids (FISH OIL) 1000 MG CAPS Take 1,000 mg by mouth daily.    . ondansetron (ZOFRAN-ODT) 8 MG disintegrating tablet Take 0.5 tablets (4 mg total) by mouth every 8 (eight) hours as needed for nausea. 20 tablet 1  . pantoprazole (PROTONIX) 40 MG tablet Take 1 tablet (40 mg total) by mouth 2 (two) times daily. 60 tablet 2  . polyethylene glycol powder (GLYCOLAX/MIRALAX) powder Take 17 g by mouth 2 (two) times daily as needed. 3350 g 1  . potassium chloride (K-DUR) 10 MEQ tablet Take 1 tablet (10 mEq total) by mouth daily. 90 tablet 3  . pravastatin (PRAVACHOL) 40 MG tablet TAKE 1 TABLET BY MOUTH AT  BEDTIME 90 tablet 3  . ranolazine (RANEXA) 500 MG 12 hr tablet Take 1 tablet (500 mg total) by mouth 2 (two) times daily. 60 tablet 3  . tamsulosin (FLOMAX) 0.4 MG CAPS capsule Take 0.4 mg by mouth as needed.    . Vitamin D, Ergocalciferol, (DRISDOL) 1.25 MG (50000 UT) CAPS capsule Take by mouth.    Alveda Reasons 20 MG TABS tablet TAKE 1 TABLET BY MOUTH  DAILY 90 tablet 0   No current facility-administered medications on file prior to visit.      Family Hx: The patient's family history includes Colon cancer in his father; Coronary artery disease in his brother; Other (age of onset: 71) in his brother; Stroke in his mother.  ROS:   Please see the history of present illness.    Review of Systems  Constitutional: Negative.   Respiratory: Negative.   Cardiovascular: Negative.   Gastrointestinal: Negative.   Musculoskeletal: Negative.   Neurological: Negative.   Psychiatric/Behavioral: Negative.   All other systems reviewed and are negative.     Labs/Other Tests and Data Reviewed:    Recent Labs: 09/16/2018: TSH 4.984 09/23/2018: Magnesium 1.7 10/23/2018: ALT 15; BUN 22; Creatinine, Ser 1.34; Hemoglobin 10.5; Platelets 267; Potassium 3.4; Sodium 141   Recent Lipid Panel Lab Results  Component Value Date/Time   CHOL 110 06/28/2016 11:06 AM   CHOL 119  10/27/2013 06:09 AM   TRIG 80.0 06/28/2016 11:06 AM  TRIG 61 10/27/2013 06:09 AM   HDL 29.90 (L) 06/28/2016 11:06 AM   HDL 38 (L) 10/27/2013 06:09 AM   CHOLHDL 4 06/28/2016 11:06 AM   LDLCALC 64 06/28/2016 11:06 AM   LDLCALC 69 10/27/2013 06:09 AM    Wt Readings from Last 3 Encounters:  11/18/18 190 lb (86.2 kg)  10/23/18 198 lb 6.4 oz (90 kg)  10/19/18 197 lb 8 oz (89.6 kg)     Exam:    Vital Signs:  BP 121/79   Pulse 90   Ht 6\' 4"  (1.93 m)   Wt 190 lb (86.2 kg)   BMI 23.13 kg/m    Very thin, no distress  alert oriented,  HEENT exam essentially benign  musculoskeletal exam with good range of motion,  neurologic exam grossly nonfocal   ASSESSMENT & PLAN:    Severe orthostasis On last clinic visit he had improved symptoms on the Northera with Florinef Midodrine was held for hypertension Now having recurrence of his symptoms again likely exacerbated by additional 7 or 8 pound weight loss Recommend he restart midodrine 10 3 times daily (He would like to take 5 mg at 6:30 AM and 5 mg 10 AM followed by 10 mg in the afternoon 2 PM and 10 mg before dinner) He will monitor orthostatics at home  Mixed hyperlipidemia  Cholesterol at goal, no further changes made  Coronary artery disease involving native coronary artery of native heart without angina pectoris Recent stress test no ischemia, no further work-up needed Stable  Atrial fibrillation, unspecified type (HCC)  on Xarelto Continue low-dose amiodarone  Congestive heart failure, unspecified congestive heart failure chronicity, unspecified congestive heart failure type ([place])  Encouraged fluid intake given his orthostasis  Chronic kidney disease (CKD), stage III (moderate) Stay hydrated  Iron deficiency anemia, unspecified iron deficiency anemia type Encouraged increased nutrition as tolerated   COVID-19 Education: The signs and symptoms of COVID-19 were discussed with the patient and how to seek care  for testing (follow up with PCP or arrange E-visit).  The importance of social distancing was discussed today.  Patient Risk:   After full review of this patients clinical status, I feel that they are at least moderate risk at this time.  Time:   Today, I have spent 25 minutes with the patient with telehealth technology discussing orthostasis, nausea, protein calorie malnutrition/anorexia, failure to thrive.     Medication Adjustments/Labs and Tests Ordered: Current medicines are reviewed at length with the patient today.  Concerns regarding medicines are outlined above.   Tests Ordered: No tests ordered   Medication Changes: We will restart midodrine 5 up to 10 mg 3 times daily  Disposition: Follow-up in 8 to 10 days on tele-visit by web   Signed, Ida Rogue, MD  11/18/2018 12:53 PM    [place] Office [address] [place] [address], [place], [place] [address]

## 2018-11-18 NOTE — Patient Instructions (Addendum)
Medication Instructions:  - Please restart midodrine 10 mg TID, send local 90 pills, refills - Also we will send in a prescription for midodrine 5 mg  3 times daily as needed 90 pills with refills  - As discussed at visit you may start out with Midodrine 5 mg at 6:30 AM and 5 mg 10 AM followed by 10 mg in the afternoon 2 PM and 10 mg before dinner  - Monitor blood pressure  If you need a refill on your cardiac medications before your next appointment, please call your pharmacy.    Lab work: No new labs needed   If you have labs (blood work) drawn today and your tests are completely normal, you will receive your results only by: Marland Kitchen MyChart Message (if you have MyChart) OR . A paper copy in the mail If you have any lab test that is abnormal or we need to change your treatment, we will call you to review the results.   Testing/Procedures: No new testing needed   Follow-Up: At [facility], you and your health needs are our priority.  As part of our continuing mission to provide you with exceptional heart care, we have created designated Provider Care Teams.  These Care Teams include your primary Cardiologist (physician) and Advanced Practice Providers (APPs -  Physician Assistants and Nurse Practitioners) who all work together to provide you with the care you need, when you need it.  . You will need a follow up appointment in 8 to 10 days by evisit (web based) .   Please call our office 2 months in advance to schedule this appointment.    . Providers on your designated Care Team:   . Murray Hodgkins, NP . Christell Faith, PA-C . Marrianne Mood, PA-C  Any Other Special Instructions Will Be Listed Below (If Applicable).  For educational health videos Log in to : [email or web address] Or : [email or web address], password : [email or web address]

## 2018-11-19 DIAGNOSIS — R2689 Other abnormalities of gait and mobility: Secondary | ICD-10-CM | POA: Diagnosis not present

## 2018-11-19 DIAGNOSIS — R42 Dizziness and giddiness: Secondary | ICD-10-CM | POA: Diagnosis not present

## 2018-11-19 NOTE — Progress Notes (Signed)
Follow up appointment scheduled for 11/26/2018 at 1:40pm

## 2018-11-23 ENCOUNTER — Ambulatory Visit: Payer: Medicare HMO | Admitting: Cardiovascular Disease

## 2018-11-23 NOTE — Telephone Encounter (Signed)
Perferct, Remeron would be a good idea

## 2018-11-23 NOTE — Telephone Encounter (Signed)
Marinol is not a medication that I typically prescribe. I would defer to Dr Manuella Ghazi regarding that medication. If it is not an option from his perspective we could consider a medication like remeron to see if that would help with his diet.

## 2018-11-24 NOTE — Telephone Encounter (Signed)
I will forward to my CMA to contact the patient and see if he would be willing to consider Remeron to help with his appetite.

## 2018-11-24 NOTE — Telephone Encounter (Signed)
Pt called back. Pt does not care to start this medication at this time already taking 30 some pills a day currently.   Might consistor taking it later down the road.   Sent to PCP as an [place]

## 2018-11-24 NOTE — Telephone Encounter (Signed)
Called pt and left a VM to call back on my direct line or main office number. CRM created sent to [facility] pool.

## 2018-11-24 NOTE — Telephone Encounter (Signed)
Noted. I will forward to Dr [name] so he is aware.

## 2018-11-26 ENCOUNTER — Other Ambulatory Visit: Payer: Self-pay

## 2018-11-26 ENCOUNTER — Telehealth (INDEPENDENT_AMBULATORY_CARE_PROVIDER_SITE_OTHER): Payer: Medicare HMO | Admitting: Cardiovascular Disease

## 2018-11-26 DIAGNOSIS — I2510 Atherosclerotic heart disease of native coronary artery without angina pectoris: Secondary | ICD-10-CM

## 2018-11-26 DIAGNOSIS — Z79899 Other long term (current) drug therapy: Secondary | ICD-10-CM | POA: Diagnosis not present

## 2018-11-26 DIAGNOSIS — N183 Chronic kidney disease, stage 3 unspecified: Secondary | ICD-10-CM

## 2018-11-26 DIAGNOSIS — I509 Heart failure, unspecified: Secondary | ICD-10-CM

## 2018-11-26 DIAGNOSIS — E440 Moderate protein-calorie malnutrition: Secondary | ICD-10-CM

## 2018-11-26 DIAGNOSIS — I4891 Unspecified atrial fibrillation: Secondary | ICD-10-CM

## 2018-11-26 DIAGNOSIS — I951 Orthostatic hypotension: Secondary | ICD-10-CM

## 2018-11-26 DIAGNOSIS — R531 Weakness: Secondary | ICD-10-CM | POA: Diagnosis not present

## 2018-11-26 DIAGNOSIS — D509 Iron deficiency anemia, unspecified: Secondary | ICD-10-CM | POA: Diagnosis not present

## 2018-11-26 DIAGNOSIS — Z7901 Long term (current) use of anticoagulants: Secondary | ICD-10-CM

## 2018-11-26 DIAGNOSIS — E782 Mixed hyperlipidemia: Secondary | ICD-10-CM

## 2018-11-26 DIAGNOSIS — I25118 Atherosclerotic heart disease of native coronary artery with other forms of angina pectoris: Secondary | ICD-10-CM

## 2018-11-26 DIAGNOSIS — R5381 Other malaise: Secondary | ICD-10-CM | POA: Diagnosis not present

## 2018-11-26 DIAGNOSIS — I480 Paroxysmal atrial fibrillation: Secondary | ICD-10-CM

## 2018-11-26 MED ORDER — RANOLAZINE ER 500 MG PO TB12: 500.0000 mg | ORAL_TABLET | Freq: Two times a day (BID) | ORAL | 3 refills | Status: DC

## 2018-11-26 NOTE — Progress Notes (Addendum)
Virtual Visit via Video Note   This visit type was conducted due to national recommendations for restrictions regarding the COVID-19 Pandemic (e.g. social distancing) in an effort to limit this patient's exposure and mitigate transmission in our community.  Due to her co-morbid illnesses, this patient is at least at moderate risk for complications without adequate follow up.  This format is felt to be most appropriate for this patient at this time.  All issues noted in this document were discussed and addressed.  A limited physical exam was performed with this format.  Please refer to the patient's chart for her consent to telehealth for [facility]    Date:  11/26/2018   ID:  William Taylor, DOB [birth year], MRN [id number]  Patient Location:  [place] [address]   Provider location:   [facility], [place] office  PCP:  [name], MD  Cardiologist:  Patsy Baltimore  Chief Complaint:  Dizzy, low blood pressures   History of Present Illness:    LAKEITH CAREAGA is a 85 y.o. male who presents via audio/video conferencing for a telehealth visit today.   The patient does not symptoms concerning for COVID-19 infection (fever, chills, cough, or new SHORTNESS OF BREATH).   Patient has a past medical history of coronary artery disease, stent in 2005,  atrial fibrillation with RVR in the setting of UTI September 2011,  stent placement May 2012 to the OM 2,  PE following catheterization started on anticoagulation, atrial fibrillation in 2013,  Last atrial fibrillation July 2018  atypical chest pain July 2018 History of anemia who presents for follow-up of his coronary disease and atrial fibrillation  Sx comes and goes, Take florinef, Midodrine 10 in am,, 5 mg noon, 10 mg in the PM On Northera 600 TID  Periodic nausea, takes a pill Drinking, appetite ok (breakfast ok, sometimes misses lunch/small amount, not much dinner, may feel better  after eating)  Now worried about high blood pressures when he lay supine They have a  couple pillows they are using No hospital bed that allows them to raise her head and drop the feet May have a recliner, does not sound like they are using it much  No blood pressures measurements made when he is nauseous  Still very debilitated Able to get up for periods of time and has to go lay down when he is tired or dizzy or not feeling well     Prior CV studies:   The following studies were reviewed today:    Past Medical History:  Diagnosis Date  . Cancer [facility])    Prostate, followed by Dr. Jacqlyn Larsen  . Colon polyp   . Coronary artery disease   . Hyperlipidemia   . Hypertension   . MI (myocardial infarction) ([place])    2015  . PAF (paroxysmal atrial fibrillation) ([place])    a. on xarelto  . Skin cancer    Past Surgical History:  Procedure Laterality Date  . CARDIAC CATHETERIZATION  10/2013   [facility]  . CATARACT EXTRACTION  Oct. 3, 2012   right eye  . colonoscopy    . COLONOSCOPY W/ POLYPECTOMY  2015   Dr Rayann Heman  . COLONOSCOPY WITH PROPOFOL N/A 08/15/2016   Procedure: COLONOSCOPY WITH PROPOFOL;  Surgeon: [name], MD;  Location[facility] END[facility]COPY;  Service: Endoscopy;  Laterality: N/A;  . CORONARY ANGIOPLASTY  2009   2005; s/p stent  . ENTEROSCOPY N/A 09/22/2018   Procedure: ENTEROSCOPY;  Surgeon: Lin Landsman, MD;  Location[facility] ENDOSCOPY;  Service: Gastroenterology;  Laterality: N/A;  . ESOPHAGOGASTRODUODENOSCOPY (EGD) WITH PROPOFOL N/A 08/15/2016   Procedure: ESOPHAGOGASTRODUODENOSCOPY (EGD) WITH PROPOFOL;  Surgeon: [name], MD;  Location: [facility] ENDOSCOPY;  Service: Endoscopy;  Laterality: N/A;  . GIVENS CAPSULE STUDY N/A 09/26/2016   Procedure: GIVENS CAPSULE STUDY;  Surgeon: [name], MD;  Location: [facility] ENDOSCOPY;  Service: Endoscopy;  Laterality: N/A;  . HERNIA REPAIR  2013  . LUMBAR SPINE SURGERY    . UPPER GI ENDOSCOPY  Sept 2015   Dr Rayann Heman     No outpatient  medications have been marked as taking for the 11/26/18 encounter (Appointment) with [name], MD.     Allergies:   Bee venom; Sulfasalazine; Sulfa antibiotics; and Warfarin and related   Social History   Tobacco Use  . Smoking status: Never Smoker  . Smokeless tobacco: Never Used  Substance Use Topics  . Alcohol use: No  . Drug use: No     Current Outpatient Medications on File Prior to Visit  Medication Sig Dispense Refill  . amiodarone (PACERONE) 200 MG tablet Take 1 tablet (200 mg total) by mouth daily. 90 tablet 0  . cyproheptadine (PERIACTIN) 4 MG tablet Take 1 tablet (4 mg total) by mouth 3 (three) times daily as needed for allergies. 90 tablet 3  . Droxidopa (NORTHERA) 100 MG CAPS Take 600 mg by mouth 3 (three) times daily. 540 capsule 6  . feeding supplement, ENSURE ENLIVE, (ENSURE ENLIVE) LIQD Take 237 mLs by mouth daily. 90 Bottle 0  . fenofibrate micronized (ANTARA) 130 MG capsule TAKE 1 CAPSULE BY MOUTH  DAILY BEFORE BREAKFAST 90 capsule 1  . finasteride (PROSCAR) 5 MG tablet Take 1 tablet (5 mg total) by mouth daily. 90 tablet 2  . fludrocortisone (FLORINEF) 0.1 MG tablet Take 1 tablet (0.1 mg total) by mouth daily. 90 tablet 3  . levobunolol (BETAGAN) 0.5 % ophthalmic solution Place 1 drop into the right eye 2 (two) times daily.     Marland Kitchen levothyroxine (SYNTHROID, LEVOTHROID) 50 MCG tablet Take 1 tablet (50 mcg total) by mouth daily at 6 (six) AM. 90 tablet 0  . midodrine (PROAMATINE) 10 MG tablet Take 1 tablet (10 mg total) by mouth 3 (three) times daily. 90 tablet 3  . midodrine (PROAMATINE) 5 MG tablet Take 1 tablet (5 mg) three times a day as needed 90 tablet 3  . nitroGLYCERIN (NITROSTAT) 0.4 MG SL tablet DISSOLVE UNDER THE TONGUE 1 TABLET EVERY 5 MINUTES AS NEEDED FOR CHEST PAIN 25 tablet 3  . Omega-3 Fatty Acids (FISH OIL) 1000 MG CAPS Take 1,000 mg by mouth daily.    . ondansetron (ZOFRAN-ODT) 8 MG disintegrating tablet Take 0.5 tablets (4 mg total) by mouth  every 8 (eight) hours as needed for nausea. 20 tablet 1  . pantoprazole (PROTONIX) 40 MG tablet Take 1 tablet (40 mg total) by mouth 2 (two) times daily. 60 tablet 2  . polyethylene glycol powder (GLYCOLAX/MIRALAX) powder Take 17 g by mouth 2 (two) times daily as needed. 3350 g 1  . potassium chloride (K-DUR) 10 MEQ tablet Take 1 tablet (10 mEq total) by mouth daily. 90 tablet 3  . pravastatin (PRAVACHOL) 40 MG tablet TAKE 1 TABLET BY MOUTH AT  BEDTIME 90 tablet 3  . ranolazine (RANEXA) 500 MG 12 hr tablet Take 1 tablet (500 mg total) by mouth 2 (two) times daily. 60 tablet 3  . tamsulosin (FLOMAX) 0.4 MG CAPS  capsule Take 0.4 mg by mouth as needed.    . Vitamin D, Ergocalciferol, (DRISDOL) 1.25 MG (50000 UT) CAPS capsule Take by mouth.    Alveda Reasons 20 MG TABS tablet TAKE 1 TABLET BY MOUTH  DAILY 90 tablet 0   No current facility-administered medications on file prior to visit.      Family Hx: The patient's family history includes Colon cancer in his father; Coronary artery disease in his brother; Other (age of onset: 2) in his brother; Stroke in his mother.  ROS:   Please see the history of present illness.    Review of Systems  Constitutional: Positive for malaise/fatigue.  Respiratory: Negative.   Cardiovascular: Negative.   Gastrointestinal: Negative.   Musculoskeletal: Negative.   Neurological: Positive for dizziness.  Psychiatric/Behavioral: Negative.   All other systems reviewed and are negative.   Labs/Other Tests and Data Reviewed:    Recent Labs: 09/16/2018: TSH 4.984 09/23/2018: Magnesium 1.7 10/23/2018: ALT 15; BUN 22; Creatinine, Ser 1.34; Hemoglobin 10.5; Platelets 267; Potassium 3.4; Sodium 141   Recent Lipid Panel Lab Results  Component Value Date/Time   CHOL 110 06/28/2016 11:06 AM   CHOL 119 10/27/2013 06:09 AM   TRIG 80.0 06/28/2016 11:06 AM   TRIG 61 10/27/2013 06:09 AM   HDL 29.90 (L) 06/28/2016 11:06 AM   HDL 38 (L) 10/27/2013 06:09 AM   CHOLHDL 4  06/28/2016 11:06 AM   LDLCALC 64 06/28/2016 11:06 AM   LDLCALC 69 10/27/2013 06:09 AM    Wt Readings from Last 3 Encounters:  11/18/18 190 lb (86.2 kg)  10/23/18 198 lb 6.4 oz (90 kg)  10/19/18 197 lb 8 oz (89.6 kg)     Exam:    Vital Signs: Vital signs may also be detailed in the HPI There were no vitals taken for this visit.  Wt Readings from Last 3 Encounters:  11/18/18 190 lb (86.2 kg)  10/23/18 198 lb 6.4 oz (90 kg)  10/19/18 197 lb 8 oz (89.6 kg)   Temp Readings from Last 3 Encounters:  10/23/18 98 F (36.7 C) (Tympanic)  09/30/18 97.7 F (36.5 C) (Oral)  09/23/18 98 F (36.7 C) (Oral)   BP Readings from Last 3 Encounters:  11/18/18 121/79  10/23/18 137/89  10/19/18 (!) 143/73   Pulse Readings from Last 3 Encounters:  11/18/18 90  10/23/18 82  10/19/18 66     Numerous blood pressure measurements noted from MyChart message April 3  10am     180/99 67   141/78 74   85/60 88  April 4  9:30am   188/95 65   125/68 73   91/59 84  April 5  6am      169/74 65    126/74 72   91/54 84  April 6  10am    184/102 66   144/79 69   78/49 89   April 7  6am      184/100 66   127/81 81  104/60 85  April 8  7:00am   177/88 66    144/76 73  108/72 81  Numbers came through his wife's account  Well nourished, well developed male in no acute distress. Constitutional:  oriented to person, place, and time. No distress.    ASSESSMENT & PLAN:    Severe orthostasis on the Northera with Florinef midodrine 10mg   3 times daily (He would like to take 5 mg at 6:30 AM and 5 mg 10 AM followed by 10 mg in the  1 PM and 10 mg before dinner pm) We have changed the timing of his midodrine based on his conversation today He will monitor orthostatics at home  Mixed hyperlipidemia Cholesterol at goal, no further changes made Stable  Coronary artery disease involving native coronary artery of native  heart without angina pectoris Recent stress test no ischemia, no further work-up needed Stable  Atrial fibrillation, unspecified type ([place]) on Xarelto Continue low-dose amiodarone  Congestive heart failure, unspecified congestive heart failure chronicity, unspecified congestive heart failure type ([place]) Encouraged fluid intake given his orthostasis  Chronic kidney disease (CKD), stage III (moderate) Encouraged him to stay hydrated  Iron deficiency anemia, unspecified iron deficiency anemia type Encouraged increased nutrition as tolerated   COVID-19 Education: The signs and symptoms of COVID-19 were discussed with the patient and how to seek care for testing (follow up with PCP or arrange E-visit).  The importance of social distancing was discussed today.  Patient Risk:   After full review of this patients clinical status, I feel that they are at least moderate risk at this time.  Time:   Today, I have spent 25 minutes with the patient with telehealth technology discussing the cardiac and medical problems/diagnoses detailed above  , discussed orthostasis,  malnutrition  Medication Adjustments/Labs and Tests Ordered: Current medicines are reviewed at length with the patient today.  Concerns regarding medicines are outlined above.   Tests Ordered: No tests ordered   Medication Changes: No changes made   Disposition: Follow-up in 6 months   Signed, Ida Rogue, MD  11/26/2018 1:49 PM    [place] [facility] [address] [place] [address], [place], [place] [address]

## 2018-11-26 NOTE — Patient Instructions (Addendum)
Medication Instructions:  Continue current medications. Refill sent in for Ranexa  If you need a refill on your cardiac medications before your next appointment, please call your pharmacy.    Lab work: No new labs needed   If you have labs (blood work) drawn today and your tests are completely normal, you will receive your results only by: Marland Kitchen MyChart Message (if you have MyChart) OR . A paper copy in the mail If you have any lab test that is abnormal or we need to change your treatment, we will call you to review the results.   Testing/Procedures: No new testing needed   Follow-Up: At [facility], you and your health needs are our priority.  As part of our continuing mission to provide you with exceptional heart care, we have created designated Provider Care Teams.  These Care Teams include your primary Cardiologist (physician) and Advanced Practice Providers (APPs -  Physician Assistants and Nurse Practitioners) who all work together to provide you with the care you need, when you need it.  . You will need a follow up VIDEO VISIT on 12/30/2018 @ 2:00 PM  . Providers on your designated Care Team:   . Murray Hodgkins, NP . Christell Faith, PA-C . Marrianne Mood, PA-C  Any Other Special Instructions Will Be Listed Below (If Applicable).  For educational health videos Log in to : [email or web address] Or : [email or web address], password : [email or web address]

## 2018-11-30 DIAGNOSIS — R69 Illness, unspecified: Secondary | ICD-10-CM | POA: Diagnosis not present

## 2018-12-03 ENCOUNTER — Other Ambulatory Visit: Payer: Self-pay | Admitting: Family Medicine

## 2018-12-03 ENCOUNTER — Other Ambulatory Visit: Payer: Self-pay

## 2018-12-03 DIAGNOSIS — I951 Orthostatic hypotension: Secondary | ICD-10-CM

## 2018-12-03 NOTE — Progress Notes (Unsigned)
dme

## 2018-12-04 ENCOUNTER — Telehealth: Payer: Self-pay | Admitting: Cardiovascular Disease

## 2018-12-04 NOTE — Telephone Encounter (Signed)
[facility] pharmacy calling to make office aware they have submitted a new Prior Authorization request for patient's Northera The key if needed is [id number]

## 2018-12-07 ENCOUNTER — Telehealth: Payer: Self-pay | Admitting: Cardiovascular Disease

## 2018-12-07 NOTE — Telephone Encounter (Signed)
Patient calling the office for samples of medication:   1.  What medication and dosage are you requesting samples for? Northera   2.  Are you currently out of this medication?  2 doses left   Pending insurance working out approval   Please also request an expedited authorization with insurance

## 2018-12-07 NOTE — Telephone Encounter (Signed)
Patient also wants to know if tablet can be changed to higher dose to prevent him from having to take so many pils

## 2018-12-07 NOTE — Telephone Encounter (Signed)
Spoke with the pt wife.  They do not feel that the medications are helping with his orthostatic hypotension. Pt is fine when sitting or laying, but weak when he tries to ambulate. He is taking his medications as prescribed. He has not used the prn midodrine 5mg . Because he is afraid it will raise his resting BP.  Pt has not been wearing his thigh high compressions stockings, pt wife sts that the pt is to weak and she had arthritis in her hands to be able to put them on. He not been wearing his abdominal binder as well. Pt wife is concerned that the pt is weak and fatigued all the time. He has lost weight due to loss of appetite and nausea. She has recently started giving him boost.  Adv the pt wife to encourage the pt to wear his abdominal binder when standing. Pt wife would like to know if Dr. Rockey Situ thinks knee high compression stocking would be beneficial, the pt is able to get those on easier. She would also like to know if Dr. Rockey Situ has any additional recommendations, they do not feel that the medication regimen is making a difference in his symptoms. She would like to know if Dr. Rockey Situ knows of a 'specialist' and if he thinks it would be beneficial, and finally can the pt be changed to long acting Northera so that he would have less tablets to take daily.  Adv the pt wife that I will fwd an update to Dr. Rockey Situ

## 2018-12-07 NOTE — Telephone Encounter (Signed)
Pt c/o BP issue: STAT if pt c/o blurred vision, one-sided weakness or slurred speech  1. What are your last 5 BP readings? 180's / 90's lying but sitting and standing drops to 80/50's  2. Are you having any other symptoms (ex. Dizziness, headache, blurred vision, passed out)? [place] loss fatigue weakness dizziness nausea dry heaving loss of appetite   3. What is your BP issue? Orthostatic hypotension patient wife concerned plan of care has not worked to help patient she is concerned and would like further advise about the above concerning issues . She is not sure if something else can be done or if a second opinion would be advised.

## 2018-12-07 NOTE — Telephone Encounter (Signed)
Patient calling.  They are needing a new PA for this medication.  Patient only has 2 doses left  Patient also wants to know if there is a high dose he can take to avoid having to take so many at one time.

## 2018-12-08 ENCOUNTER — Telehealth: Payer: Self-pay

## 2018-12-08 NOTE — Telephone Encounter (Signed)
Request Reference Number:  [id number].  [place] CAP 100MG  is approved through 01/07/2019.  For further questions, call [phone]

## 2018-12-08 NOTE — Telephone Encounter (Signed)
Spoke with patient. Patients last dose was 5 tablets at lunch today (12/08/2018) Dr. Rockey Situ verbally told me to tell the patient to make sure he limits his movement with being off of this medication until he received his medication in the mail.

## 2018-12-08 NOTE — Telephone Encounter (Signed)
Spoke with patient and made him aware that I reached out to pharmacy and reviewed the need for his refill medication to be sent overnight to him. He verbalized understanding and was agreeable with this plan. Patients wife was on the phone with the pharmacy and waited until after she got off the phone to review with her as well. Reviewed that they would overnight the medication to them. She also checked with pharmacy regarding pill dosage and they do offer a 200 mg and 300 mg pill dose. She reports that he has a difficult time taking the 6 pills three times a day. Instructed her to please call us about 3 weeks before they run out of the 100 mg pills and we can then try to send in a new prescription for the 300 mg pill to help reduce the number of pills he is needing to take. She verbalized understanding of our conversation, agreement with plan, and had no further questions at this time.   [facility] was [phone] and they had to transfer me to a different team to handle this prescription. Next time we will this medication we need to send in new script, get prior authorization, and call pharmacy to confirm all of this information in advance so that there is no missed doses of medication.

## 2018-12-08 NOTE — Telephone Encounter (Signed)
His symptoms are consistent with autonomic dysfunction I am uncertain if there is a specialist at [place] to hear that the medications seem to be wearing off when they were working relatively well when initiated  Would consider starting a new medication that works by different mechanism Pyridostigmine extended release (180 mg/day orally)  This medication works by supporting blood pressure when he is standing, should not push the blood pressure up when he is supine  Pam is working on getting larger doses of the northera to avoid the multiple pills at a time

## 2018-12-08 NOTE — Telephone Encounter (Signed)
PA submitted through covermymeds  Anwar Delagarza (Key: [id number])  Northera 100MG  OR CAPS  Wait for Determination Please wait for [facility] [employer] 2017 NCPDP to return a determination

## 2018-12-08 NOTE — Telephone Encounter (Signed)
Called and spoke with [facility] pharmacy and they then transferred me to [facility] pharmacy for further assistance. Once there they reviewed that [place] speciality pharmacy is handling this medication and transferred me over to their support person. Spoke with Mendel Ryder and reviewed prior authorization number, expiration date, medication, instructions, and confirmed patients mailing address and information to send out. She read back all information and states that they will have copay of $44.00 and will get it shipped overnight for them to watch for that to arrive tomorrow. Reviewed that I would contact patient and his wife to make them aware of shipment to arrive.

## 2018-12-08 NOTE — Telephone Encounter (Signed)
Patients wife Ivin Booty calling regarding patients Northera.  Made her aware that we did not have samples at this time for Northera.  Awaiting response for PA on Northera 100MG - "Take 600mg  3 times a day." Patient is currently out of this medication.

## 2018-12-09 ENCOUNTER — Telehealth: Payer: Self-pay | Admitting: *Deleted

## 2018-12-09 NOTE — Telephone Encounter (Signed)
Spoke with the pt wife and made her aware of Dr. Donivan Scull response. William Taylor sts that the pt does not feel that [name] is helping in fact his symptoms or worsening. He cannot walk a couple of feet without feeling dizzy and having to stop and sit. She has written down the name of the alternative medication Pyridostigmine 180mg  daily recommended by Dr. Rockey Situ. She will discuss it with the pt and call back if he is in agreement with switching. Pt wife voiced appreciation for all of the assistance the have received.

## 2018-12-09 NOTE — Telephone Encounter (Signed)
Pt has been approved through 12/08/2018-01/07/2019. PA department questions or concerns [phone].

## 2018-12-10 ENCOUNTER — Telehealth: Payer: Self-pay | Admitting: Cardiovascular Disease

## 2018-12-10 ENCOUNTER — Telehealth: Payer: Self-pay | Admitting: Family Medicine

## 2018-12-10 NOTE — Telephone Encounter (Signed)
Would start the new pill and see if pressure improves when standing (would hope for less of a  Drop). If BP stable and he is more functional, could try to wean down on the northera

## 2018-12-10 NOTE — Telephone Encounter (Signed)
Pt 's wife called and would like for the doctor's nurse to give him a call back , she did mention what the call was in reference to.

## 2018-12-10 NOTE — Telephone Encounter (Signed)
Spoke with patients wife per release form and she states that he is just not doing any better. He is wearing a support belt and compression thigh highs. She reports that he has been in bed all day and has not gotten up. She is very concerned about him. She received his one month shipment of the Northera 100 mg pills and I did call pharmacy to confirm if they have the 300 mg pills for his next order. She wanted to know about the new medication mentioned the other day when she spoke with Lattie Haw the Pyridostigmine extended release (180 mg/day orally). She wanted to know if he were to start this if he would discontinue the Northera. Advised that I would check with provider about this and would also let that pharmacy know they received shipment. She was appreciative for the call with no further questions.  Called pharmacy [facility] and reviewed that patient received shipment from another pharmacy and did not need any at this time.   Will route to provider to get information on new pill.

## 2018-12-10 NOTE — Telephone Encounter (Signed)
Spoke with pharmacy regarding request for higher dose pill and they do not have this patient on file for any current medications. Advised that I would reach out to the patient and family and would then give them a call back with any further questions.

## 2018-12-10 NOTE — Telephone Encounter (Signed)
°  Pt c/o medication issue:  1. Name of Medication: Northera   2. How are you currently taking this medication (dosage and times per day)? 100 MG - 600 MG by mouth 3 times daily   3. Are you having a reaction (difficulty breathing--STAT)? no  4. What is your medication issue? [facility] calling, they would like to request that patient's dosage be switched to 300 MG so patient will not have to take as many tablets a day.  Please call to advise and discuss at [phone] ASAP.

## 2018-12-11 NOTE — Telephone Encounter (Signed)
Called and spoke with pt's wife. Wife advised and they would like for him to try the [facility]

## 2018-12-11 NOTE — Telephone Encounter (Signed)
Pt's wife called me back. She stated that her husband just keeps declining and is getting weaker and weaker. Still not eating or drinking much due to feeling so nauseated even though he wants to eat he just can seem to hold anything down due to feeling so nauseated. Pt is still dry having as well. Pt's wife wanted to start discuss marijuana treatment that were reccommended by Dr. [name] the gastro doctor pt did an endoscopy and everything was normal pt keeps stating that "his tummy feels like it is rolling"

## 2018-12-11 NOTE — Telephone Encounter (Signed)
Marinol is not something that I prescribe. We could try remeron. That is the medication that we offered previously though he declined this medication. If he wants to try that medication I could send it to his pharmacy. If he wants to pursue the marinol this would need to be discussed with his neurologist to see if this is something they prescribe.

## 2018-12-11 NOTE — Telephone Encounter (Signed)
Called and left a VM to call back.

## 2018-12-11 NOTE — Telephone Encounter (Signed)
He is taking Northera 100 mg 4 tablets three times a day right now due to severe nausea, weakness, and inability to walk. He reports weakness in his arms and legs are so bad. She is very concerned and states that he is having continued abdominal pain. She reported blood pressures for today and yesterday. Will route to provider for review before adding any additional medications. Instructed her to continue current dose until I hear back from provider. She has also placed call into his PCP and GI physician for the other issues as well.   Today 12/11/18 168/98 Laying 141/89 Sitting 105/62 Standing  Yesterday 12/10/18 198/103 Laying 133/73 Sitting 115/63 Standing

## 2018-12-12 NOTE — Telephone Encounter (Signed)
After reviewing remeron's side effect profile it may not be the best medication to try as some of the side effects could worsen some of his current issues (light headedness, orthostatic hypotension). They should contact his neurologists office to let them know about his symptoms as it likely indicates autonomic dysfunction and there may be treatment that they are able to provide for his symptoms. I would also suggest evaluation in the office to see how he is doing and determine if there is anything additional we could provide. Please do the COVID-19 screening questions and then offer him an appointment with Lauren this week or with me when I am back in the office the following week.

## 2018-12-13 NOTE — Telephone Encounter (Signed)
He certainly would seem he has a neurologic disease causing severe autonomic dysfunction Medications are a band aid not a cure unfortunately The BP numbers shared do show that the pills are working,  But still dropping/orthostatic The northera can be taken up to 600 TID (we can work to increase the mg to decrease the number of pills) The mestinon ER pill recommended might help some of the drop when standing The nausea could be coming from low pressures, Any only thing I would recommend would be nausea medication, try to eat in a recliner with feet up, Try the mestinon if still having near syncope symtoms  I will leave it up to neuro to look for a cure. They may have contacts at [facility] for specialist in brain diseases causing severe autonomic dysfunction

## 2018-12-14 MED ORDER — PYRIDOSTIGMINE BROMIDE ER 180 MG PO TBCR: 180.0000 mg | EXTENDED_RELEASE_TABLET | Freq: Every day | ORAL | 3 refills | Status: DC

## 2018-12-14 NOTE — Telephone Encounter (Addendum)
Spoke with patients wife per release form and reviewed providers recommendations to start Mestinon 180 mg once a day. She reports that patient has lost significant weight and has just not been doing well. She is waiting on call back from his PCP office with recommendations as well. Reviewed that Dr. Rockey Situ felt he would benefit from seeing a neurologist to see if there are other options available. Reviewed that [facility] or [facility] may have specialist in brain diseases causing these symptoms. Provided her with number to [facility] outpatient [facility]. She will also discuss with Dr. Caryl Bis for possible referral. Instructed her that I would send in new medication to take once daily and to please call us if she should have any further questions. She verbalized understanding of our conversation, agreement with plan, and had no further concerns at this time.

## 2018-12-14 NOTE — Telephone Encounter (Signed)
Call pt  If patient is not eating or drinking much, I am very concerned with weakness.  He must be seen in ED.    Unfortunately I cannot order please advance.  Patient will have to be treated and triaged by provider in emergency room.  I will certainly call [facility] and let them know patient is coming.  Please call and advise patient to go to [facility] ED.   Are they agreeable?

## 2018-12-14 NOTE — Addendum Note (Signed)
Addended by: Valora Corporal on: 12/14/2018 09:46 AM   Modules accepted: Orders

## 2018-12-14 NOTE — Telephone Encounter (Signed)
Yes I will make sure and follow up with him and wife tomorrow

## 2018-12-14 NOTE — Telephone Encounter (Signed)
Called and spoke with patient's wife. She asked her husband if willing to go to the ED and he is but would rather go tomorrow. Advised for her to call me on my direct number or the main office and I can call the hospital to make them aware that pt will be coming.

## 2018-12-14 NOTE — Telephone Encounter (Signed)
Please call pt in the morning and ensure that he does go to ED. Please follow as Im not in office tomorrow  Lauren, Juluis Rainier. Will you ensure this patient  Goes to ed tomorrow; see notes below

## 2018-12-14 NOTE — Telephone Encounter (Signed)
Called and spoke with pt's wife who Dr. Candis Musa stated that the pt should go to autonomic specialist outpatient clinic in [facility] Dr. Candis Musa autonomic disorder.   Dr. Candis Musa Rx's pyridosgigmine but they are unable to get this medication until Tuesday.   Pt is down 38 pounds was 223 lbs and now weighting 185 lbs.  William Taylor wanted to know would Dr. Caryl Bis be the one to place the referral to the autonomic specialist since this is his PCP and she is really concern with his weight loss not eating much or drink and husband is becoming weaker and weaker and she wanted to know if orders can be placed so he can go to the hospital to be hooked up to an IV for fluids.   William Taylor pt's wife is aware that PCP is out of the office and this is being sent to covering provider Dr. Vidal Schwalbe.

## 2018-12-14 NOTE — Telephone Encounter (Signed)
PT has declined over the weekend, very dehydrated and is really sick, too week to even get to the bathroom, not eating and drinking. Would Dr Chauncey Cruel. Please call Ivin Booty at [phone] today as soon as possible, very worried

## 2018-12-15 ENCOUNTER — Other Ambulatory Visit: Payer: Self-pay

## 2018-12-15 ENCOUNTER — Encounter: Payer: Self-pay | Admitting: Emergency Medicine

## 2018-12-15 ENCOUNTER — Other Ambulatory Visit: Payer: Self-pay | Admitting: *Deleted

## 2018-12-15 ENCOUNTER — Inpatient Hospital Stay
Admission: RE | Admit: 2018-12-15 | Discharge: 2018-12-20 | DRG: 312 | Disposition: A | Discharge status: Home / Self Care | Payer: Medicare HMO | Attending: Internal Medicine | Admitting: Internal Medicine

## 2018-12-15 DIAGNOSIS — Z7901 Long term (current) use of anticoagulants: Secondary | ICD-10-CM

## 2018-12-15 DIAGNOSIS — K219 Gastro-esophageal reflux disease without esophagitis: Secondary | ICD-10-CM | POA: Diagnosis present

## 2018-12-15 DIAGNOSIS — Z882 Allergy status to sulfonamides status: Secondary | ICD-10-CM

## 2018-12-15 DIAGNOSIS — D591 Other autoimmune hemolytic anemias: Secondary | ICD-10-CM | POA: Diagnosis present

## 2018-12-15 DIAGNOSIS — I482 Chronic atrial fibrillation, unspecified: Secondary | ICD-10-CM | POA: Diagnosis present

## 2018-12-15 DIAGNOSIS — I252 Old myocardial infarction: Secondary | ICD-10-CM

## 2018-12-15 DIAGNOSIS — Z66 Do not resuscitate: Secondary | ICD-10-CM | POA: Diagnosis present

## 2018-12-15 DIAGNOSIS — N400 Enlarged prostate without lower urinary tract symptoms: Secondary | ICD-10-CM | POA: Diagnosis not present

## 2018-12-15 DIAGNOSIS — I951 Orthostatic hypotension: Principal | ICD-10-CM | POA: Diagnosis present

## 2018-12-15 DIAGNOSIS — Z800 Family history of malignant neoplasm of digestive organs: Secondary | ICD-10-CM

## 2018-12-15 DIAGNOSIS — R42 Dizziness and giddiness: Secondary | ICD-10-CM

## 2018-12-15 DIAGNOSIS — R1310 Dysphagia, unspecified: Secondary | ICD-10-CM | POA: Diagnosis present

## 2018-12-15 DIAGNOSIS — G909 Disorder of the autonomic nervous system, unspecified: Secondary | ICD-10-CM | POA: Diagnosis present

## 2018-12-15 DIAGNOSIS — E785 Hyperlipidemia, unspecified: Secondary | ICD-10-CM | POA: Diagnosis present

## 2018-12-15 DIAGNOSIS — D649 Anemia, unspecified: Secondary | ICD-10-CM

## 2018-12-15 DIAGNOSIS — D539 Nutritional anemia, unspecified: Secondary | ICD-10-CM | POA: Diagnosis present

## 2018-12-15 DIAGNOSIS — Z85828 Personal history of other malignant neoplasm of skin: Secondary | ICD-10-CM

## 2018-12-15 DIAGNOSIS — I2510 Atherosclerotic heart disease of native coronary artery without angina pectoris: Secondary | ICD-10-CM | POA: Diagnosis present

## 2018-12-15 DIAGNOSIS — Z7952 Long term (current) use of systemic steroids: Secondary | ICD-10-CM

## 2018-12-15 DIAGNOSIS — Z91030 Bee allergy status: Secondary | ICD-10-CM

## 2018-12-15 DIAGNOSIS — Z888 Allergy status to other drugs, medicaments and biological substances status: Secondary | ICD-10-CM

## 2018-12-15 DIAGNOSIS — I10 Essential (primary) hypertension: Secondary | ICD-10-CM | POA: Diagnosis present

## 2018-12-15 DIAGNOSIS — Z86010 Personal history of colonic polyps: Secondary | ICD-10-CM

## 2018-12-15 DIAGNOSIS — Z8249 Family history of ischemic heart disease and other diseases of the circulatory system: Secondary | ICD-10-CM

## 2018-12-15 DIAGNOSIS — Z86711 Personal history of pulmonary embolism: Secondary | ICD-10-CM

## 2018-12-15 DIAGNOSIS — Z955 Presence of coronary angioplasty implant and graft: Secondary | ICD-10-CM

## 2018-12-15 DIAGNOSIS — I480 Paroxysmal atrial fibrillation: Secondary | ICD-10-CM | POA: Diagnosis present

## 2018-12-15 DIAGNOSIS — Z79899 Other long term (current) drug therapy: Secondary | ICD-10-CM

## 2018-12-15 DIAGNOSIS — Z79890 Hormone replacement therapy (postmenopausal): Secondary | ICD-10-CM

## 2018-12-15 DIAGNOSIS — K8020 Calculus of gallbladder without cholecystitis without obstruction: Secondary | ICD-10-CM | POA: Diagnosis present

## 2018-12-15 DIAGNOSIS — I4891 Unspecified atrial fibrillation: Secondary | ICD-10-CM | POA: Diagnosis not present

## 2018-12-15 DIAGNOSIS — C61 Malignant neoplasm of prostate: Secondary | ICD-10-CM | POA: Diagnosis present

## 2018-12-15 DIAGNOSIS — R109 Unspecified abdominal pain: Secondary | ICD-10-CM

## 2018-12-15 LAB — URINALYSIS, COMPLETE (UACMP) WITH MICROSCOPIC
Bacteria, UA: NONE SEEN
Bilirubin Urine: NEGATIVE
Glucose, UA: NEGATIVE mg/dL
Hgb urine dipstick: NEGATIVE
Ketones, ur: NEGATIVE mg/dL
Leukocytes,Ua: NEGATIVE
Nitrite: NEGATIVE
Protein, ur: NEGATIVE mg/dL
Specific Gravity, Urine: 1.01 (ref 1.005–1.030)
pH: 7 (ref 5.0–8.0)

## 2018-12-15 LAB — CBC WITH DIFFERENTIAL/PLATELET
Abs Immature Granulocytes: 0.06 10*3/uL (ref 0.00–0.07)
Basophils Absolute: 0 10*3/uL (ref 0.0–0.1)
Basophils Relative: 1 %
Eosinophils Absolute: 0 10*3/uL (ref 0.0–0.5)
Eosinophils Relative: 0 %
HCT: 25.2 % — ABNORMAL LOW (ref 39.0–52.0)
Hemoglobin: 8.9 g/dL — ABNORMAL LOW (ref 13.0–17.0)
Immature Granulocytes: 1 %
Lymphocytes Relative: 16 %
Lymphs Abs: 1 10*3/uL (ref 0.7–4.0)
MCH: 37.9 pg — ABNORMAL HIGH (ref 26.0–34.0)
MCHC: 35.3 g/dL (ref 30.0–36.0)
MCV: 107.2 fL — ABNORMAL HIGH (ref 80.0–100.0)
Monocytes Absolute: 0.6 10*3/uL (ref 0.1–1.0)
Monocytes Relative: 9 %
Neutro Abs: 4.8 10*3/uL (ref 1.7–7.7)
Neutrophils Relative %: 73 %
Platelets: 310 10*3/uL (ref 150–400)
RBC: 2.35 MIL/uL — ABNORMAL LOW (ref 4.22–5.81)
RDW: 13.6 % (ref 11.5–15.5)
WBC: 6.5 10*3/uL (ref 4.0–10.5)
nRBC: 0 % (ref 0.0–0.2)

## 2018-12-15 LAB — COMPREHENSIVE METABOLIC PANEL
ALT: 12 U/L (ref 0–44)
AST: 33 U/L (ref 15–41)
Albumin: 3.8 g/dL (ref 3.5–5.0)
Alkaline Phosphatase: 32 U/L — ABNORMAL LOW (ref 38–126)
Anion gap: 10 (ref 5–15)
BUN: 15 mg/dL (ref 8–23)
CO2: 25 mmol/L (ref 22–32)
Calcium: 9.2 mg/dL (ref 8.9–10.3)
Chloride: 99 mmol/L (ref 98–111)
Creatinine, Ser: 1.17 mg/dL (ref 0.61–1.24)
GFR calc Af Amer: >60 mL/min (ref >60)
GFR calc non Af Amer: 56 mL/min — ABNORMAL LOW (ref >60)
Glucose, Bld: 144 mg/dL — ABNORMAL HIGH (ref 70–99)
Potassium: 3.4 mmol/L — ABNORMAL LOW (ref 3.5–5.1)
Sodium: 134 mmol/L — ABNORMAL LOW (ref 135–145)
Total Bilirubin: 2.2 mg/dL — ABNORMAL HIGH (ref 0.3–1.2)
Total Protein: 6.4 g/dL — ABNORMAL LOW (ref 6.5–8.1)

## 2018-12-15 LAB — TROPONIN I: Troponin I: <0.03 ng/mL (ref <0.03)

## 2018-12-15 LAB — RETICULOCYTES
Immature Retic Fract: 27.1 % — ABNORMAL HIGH (ref 2.3–15.9)
RBC.: 2.44 MIL/uL — ABNORMAL LOW (ref 4.22–5.81)
Retic Count, Absolute: 86.4 10*3/uL (ref 19.0–186.0)
Retic Ct Pct: 3.5 % — ABNORMAL HIGH (ref 0.4–3.1)

## 2018-12-15 LAB — TSH: TSH: 1.116 u[IU]/mL (ref 0.350–4.500)

## 2018-12-15 LAB — ABO/RH: ABO/RH(D): A POS

## 2018-12-15 LAB — PREPARE RBC (CROSSMATCH)

## 2018-12-15 MED ORDER — PANTOPRAZOLE SODIUM 40 MG PO TBEC
40.0000 mg | DELAYED_RELEASE_TABLET | Freq: Two times a day (BID) | ORAL | Status: DC
  Administered 2018-12-15 – 2018-12-20 (×10): 40 mg via ORAL
  Filled 2018-12-15 (×10): qty 1

## 2018-12-15 MED ORDER — ONDANSETRON 4 MG PO TBDP: 4.0000 mg | ORAL_TABLET | Freq: Three times a day (TID) | ORAL | Status: DC | PRN

## 2018-12-15 MED ORDER — TAMSULOSIN HCL 0.4 MG PO CAPS
0.4000 mg | ORAL_CAPSULE | Freq: Every day | ORAL | Status: DC
  Administered 2018-12-15: 18:00:00 0.4 mg via ORAL
  Filled 2018-12-15: qty 1

## 2018-12-15 MED ORDER — FINASTERIDE 5 MG PO TABS
5.0000 mg | ORAL_TABLET | Freq: Every day | ORAL | Status: DC
  Administered 2018-12-15 – 2018-12-20 (×6): 5 mg via ORAL
  Filled 2018-12-15 (×6): qty 1

## 2018-12-15 MED ORDER — FLUDROCORTISONE ACETATE 0.1 MG PO TABS
0.1000 mg | ORAL_TABLET | Freq: Every day | ORAL | Status: DC
  Administered 2018-12-15 – 2018-12-16 (×2): 0.1 mg via ORAL
  Filled 2018-12-15 (×3): qty 1

## 2018-12-15 MED ORDER — LEVOBUNOLOL HCL 0.5 % OP SOLN
1.0000 [drp] | Freq: Two times a day (BID) | OPHTHALMIC | Status: DC
  Administered 2018-12-15 – 2018-12-20 (×10): 1 [drp] via OPHTHALMIC
  Filled 2018-12-15: qty 5

## 2018-12-15 MED ORDER — RANOLAZINE ER 500 MG PO TB12
500.0000 mg | ORAL_TABLET | Freq: Two times a day (BID) | ORAL | Status: DC
  Administered 2018-12-15 – 2018-12-20 (×10): 500 mg via ORAL
  Filled 2018-12-15 (×11): qty 1

## 2018-12-15 MED ORDER — PRAVASTATIN SODIUM 40 MG PO TABS
40.0000 mg | ORAL_TABLET | Freq: Every day | ORAL | Status: DC
  Administered 2018-12-15 – 2018-12-19 (×5): 40 mg via ORAL
  Filled 2018-12-15 (×5): qty 1

## 2018-12-15 MED ORDER — NITROGLYCERIN 0.4 MG SL SUBL: 0.4000 mg | SUBLINGUAL_TABLET | SUBLINGUAL | Status: DC | PRN

## 2018-12-15 MED ORDER — AMIODARONE HCL 200 MG PO TABS
200.0000 mg | ORAL_TABLET | Freq: Every day | ORAL | Status: DC
  Administered 2018-12-15 – 2018-12-16 (×2): 200 mg via ORAL
  Filled 2018-12-15 (×2): qty 1

## 2018-12-15 MED ORDER — ONDANSETRON HCL 4 MG/2ML IJ SOLN
4.0000 mg | Freq: Four times a day (QID) | INTRAMUSCULAR | Status: DC | PRN
  Administered 2018-12-16 – 2018-12-19 (×3): 4 mg via INTRAVENOUS
  Filled 2018-12-15 (×3): qty 2

## 2018-12-15 MED ORDER — SODIUM CHLORIDE 0.9% FLUSH: 3.0000 mL | INTRAVENOUS | Status: DC | PRN

## 2018-12-15 MED ORDER — ACETAMINOPHEN 650 MG RE SUPP: 650.0000 mg | Freq: Four times a day (QID) | RECTAL | Status: DC | PRN

## 2018-12-15 MED ORDER — DROXIDOPA 100 MG PO CAPS
600.0000 mg | ORAL_CAPSULE | Freq: Three times a day (TID) | ORAL | Status: DC
  Filled 2018-12-15: qty 90

## 2018-12-15 MED ORDER — RIVAROXABAN 20 MG PO TABS
20.0000 mg | ORAL_TABLET | Freq: Every day | ORAL | Status: DC
  Administered 2018-12-15 – 2018-12-20 (×6): 20 mg via ORAL
  Filled 2018-12-15 (×6): qty 1

## 2018-12-15 MED ORDER — FENOFIBRATE 54 MG PO TABS
54.0000 mg | ORAL_TABLET | Freq: Every day | ORAL | Status: DC
  Administered 2018-12-16: 54 mg via ORAL
  Filled 2018-12-15: qty 1

## 2018-12-15 MED ORDER — SODIUM CHLORIDE 0.9 % IV SOLN
Freq: Once | INTRAVENOUS | Status: AC
  Administered 2018-12-15: 11:00:00 via INTRAVENOUS

## 2018-12-15 MED ORDER — PYRIDOSTIGMINE BROMIDE ER 180 MG PO TBCR: 180.0000 mg | EXTENDED_RELEASE_TABLET | Freq: Every day | ORAL | Status: DC

## 2018-12-15 MED ORDER — MIDODRINE HCL 5 MG PO TABS
10.0000 mg | ORAL_TABLET | Freq: Three times a day (TID) | ORAL | Status: DC
  Administered 2018-12-15 – 2018-12-20 (×9): 10 mg via ORAL
  Filled 2018-12-15 (×12): qty 2

## 2018-12-15 MED ORDER — ACETAMINOPHEN 325 MG PO TABS
650.0000 mg | ORAL_TABLET | Freq: Four times a day (QID) | ORAL | Status: DC | PRN
  Administered 2018-12-16 – 2018-12-19 (×4): 650 mg via ORAL
  Filled 2018-12-15 (×4): qty 2

## 2018-12-15 MED ORDER — POTASSIUM CHLORIDE CRYS ER 20 MEQ PO TBCR
40.0000 meq | EXTENDED_RELEASE_TABLET | Freq: Once | ORAL | Status: AC
  Administered 2018-12-15: 40 meq via ORAL
  Filled 2018-12-15: qty 2

## 2018-12-15 MED ORDER — HYDRALAZINE HCL 20 MG/ML IJ SOLN
2.0000 mg | INTRAMUSCULAR | Status: DC | PRN
  Administered 2018-12-17: 2 mg via INTRAVENOUS
  Filled 2018-12-15: qty 1

## 2018-12-15 MED ORDER — PYRIDOSTIGMINE BROMIDE 60 MG PO TABS
60.0000 mg | ORAL_TABLET | Freq: Three times a day (TID) | ORAL | Status: DC
  Administered 2018-12-15 – 2018-12-18 (×9): 60 mg via ORAL
  Filled 2018-12-15 (×12): qty 1

## 2018-12-15 MED ORDER — SODIUM CHLORIDE 0.9% FLUSH
3.0000 mL | Freq: Two times a day (BID) | INTRAVENOUS | Status: DC
  Administered 2018-12-16 – 2018-12-20 (×9): 3 mL via INTRAVENOUS

## 2018-12-15 MED ORDER — SODIUM CHLORIDE 0.9 % IV SOLN: 250.0000 mL | INTRAVENOUS | Status: DC | PRN

## 2018-12-15 MED ORDER — SODIUM CHLORIDE 0.9 % IV SOLN: 10.0000 mL/h | Freq: Once | INTRAVENOUS | Status: DC

## 2018-12-15 MED ORDER — ONDANSETRON HCL 4 MG PO TABS
4.0000 mg | ORAL_TABLET | Freq: Four times a day (QID) | ORAL | Status: DC | PRN
  Filled 2018-12-15: qty 1

## 2018-12-15 MED ORDER — DROXIDOPA 100 MG PO CAPS
400.0000 mg | ORAL_CAPSULE | Freq: Three times a day (TID) | ORAL | Status: DC
  Administered 2018-12-16 – 2018-12-20 (×8): 400 mg via ORAL
  Filled 2018-12-15 (×11): qty 4

## 2018-12-15 MED ORDER — LEVOTHYROXINE SODIUM 50 MCG PO TABS
50.0000 ug | ORAL_TABLET | Freq: Every day | ORAL | Status: DC
  Administered 2018-12-16 – 2018-12-20 (×5): 50 ug via ORAL
  Filled 2018-12-15 (×6): qty 1

## 2018-12-15 NOTE — ED Notes (Signed)
Blood bank called to inform this RN of delay with blood transfusion

## 2018-12-15 NOTE — ED Notes (Signed)
ED TO INPATIENT HANDOFF REPORT  ED Nurse Name and Phone #: Edell Mesenbrink [phone]  S Name/Age/Gender William Taylor 85 y.o. male Room/Bed: ED19A/ED19A  Code Status   Code Status: Prior  Home/SNF/Other Home Patient oriented to: self, place and situation Is this baseline? Yes   Triage Complete: Triage complete  Chief Complaint sent by dr/possible dehydration  Triage Note Pt sent by his PCP due to a drop in blood pressure with change of position;per pt report. Pt called PCP who suggested pt come to the ED for IV hydration. PT reports a recent admission to the hospital for the same complaints and states "it never got better." No complaints of pain.    Allergies Allergies  Allergen Reactions  . Bee Venom Swelling  . Sulfasalazine Hives and Swelling  . Sulfa Antibiotics Hives and Swelling  . Warfarin And Related Other (See Comments)    Chest pain    Level of Care/Admitting Diagnosis ED Disposition    ED Disposition Condition [facility] Area: [place] [[id number]]  Level of Care: Telemetry [5]  Covid Evaluation: N/A  Diagnosis: Dizziness [841324]  Admitting Physician: Dustin Flock [[id number]]  Attending Physician: Dustin Flock [[id number]]  PT Class (Do Not Modify): Observation [104]  PT Acc Code (Do Not Modify): Observation [10022]       B Medical/Surgery History Past Medical History:  Diagnosis Date  . Cancer [facility])    Prostate, followed by Dr. Jacqlyn Larsen  . Colon polyp   . Coronary artery disease   . Hyperlipidemia   . Hypertension   . MI (myocardial infarction) ([place])    2015  . PAF (paroxysmal atrial fibrillation) ([place])    a. on xarelto  . Skin cancer    Past Surgical History:  Procedure Laterality Date  . CARDIAC CATHETERIZATION  10/2013   [facility]  . CATARACT EXTRACTION  Oct. 3, 2012   right eye  . colonoscopy    . COLONOSCOPY W/ POLYPECTOMY  2015   Dr Rayann Heman  . COLONOSCOPY WITH PROPOFOL N/A 08/15/2016   Procedure: COLONOSCOPY WITH  PROPOFOL;  Surgeon: [name], MD;  Location: [facility] ENDOSCOPY;  Service: Endoscopy;  Laterality: N/A;  . CORONARY ANGIOPLASTY  2009   2005; s/p stent  . ENTEROSCOPY N/A 09/22/2018   Procedure: ENTEROSCOPY;  Surgeon: Lin Landsman, MD;  Location: [facility];  Service: Gastroenterology;  Laterality: N/A;  . ESOPHAGOGASTRODUODENOSCOPY (EGD) WITH PROPOFOL N/A 08/15/2016   Procedure: ESOPHAGOGASTRODUODENOSCOPY (EGD) WITH PROPOFOL;  Surgeon: [name], MD;  Location: [facility] ENDOSCOPY;  Service: Endoscopy;  Laterality: N/A;  . GIVENS CAPSULE STUDY N/A 09/26/2016   Procedure: GIVENS CAPSULE STUDY;  Surgeon: [name], MD;  Location: [facility] ENDOSCOPY;  Service: Endoscopy;  Laterality: N/A;  . HERNIA REPAIR  2013  . LUMBAR SPINE SURGERY    . UPPER GI ENDOSCOPY  Sept 2015   Dr Dellia Nims IV Location/Drains/Wounds Patient Lines/Drains/Airways Status   Active Line/Drains/Airways    Name:   Placement date:   Placement time:   Site:   Days:   Peripheral IV 12/15/18 Left Arm   12/15/18    1014    Arm   less than 1          Intake/Output Last 24 hours  Intake/Output Summary (Last 24 hours) at 12/15/2018 1538 Last data filed at 12/15/2018 1251 Gross per 24 hour  Intake 1000 ml  Output -  Net 1000 ml    Labs/Imaging Results for orders placed or  performed during the hospital encounter of 12/15/18 (from the past 48 hour(s))  Comprehensive metabolic panel     Status: Abnormal   Collection Time: 12/15/18 10:22 AM  Result Value Ref Range   Sodium 134 (L) 135 - 145 mmol/L   Potassium 3.4 (L) 3.5 - 5.1 mmol/L   Chloride 99 98 - 111 mmol/L   CO2 25 22 - 32 mmol/L   Glucose, Bld 144 (H) 70 - 99 mg/dL   BUN 15 8 - 23 mg/dL   Creatinine, Ser 1.17 0.61 - 1.24 mg/dL   Calcium 9.2 8.9 - 10.3 mg/dL   Total Protein 6.4 (L) 6.5 - 8.1 g/dL   Albumin 3.8 3.5 - 5.0 g/dL   AST 33 15 - 41 U/L   ALT 12 0 - 44 U/L   Alkaline Phosphatase 32 (L) 38 - 126 U/L   Total Bilirubin 2.2 (H) 0.3 - 1.2 mg/dL    GFR calc non Af Amer 56 (L) >60 mL/min   GFR calc Af Amer >60 >60 mL/min   Anion gap 10 5 - 15    Comment: Performed at [facility], [address] [place], [place] [address]  Troponin I - ONCE - STAT     Status: None   Collection Time: 12/15/18 10:22 AM  Result Value Ref Range   Troponin I <0.03 <0.03 ng/mL    Comment: Performed at [facility], [address] [place], [place] [address]  CBC with Differential/Platelet     Status: Abnormal   Collection Time: 12/15/18 11:08 AM  Result Value Ref Range   WBC 6.5 4.0 - 10.5 K/uL   RBC 2.35 (L) 4.22 - 5.81 MIL/uL   Hemoglobin 8.9 (L) 13.0 - 17.0 g/dL   HCT 25.2 (L) 39.0 - 52.0 %   MCV 107.2 (H) 80.0 - 100.0 fL   MCH 37.9 (H) 26.0 - 34.0 pg   MCHC 35.3 30.0 - 36.0 g/dL   RDW 13.6 11.5 - 15.5 %   Platelets 310 150 - 400 K/uL   nRBC 0.0 0.0 - 0.2 %   Neutrophils Relative % 73 %   Neutro Abs 4.8 1.7 - 7.7 K/uL   Lymphocytes Relative 16 %   Lymphs Abs 1.0 0.7 - 4.0 K/uL   Monocytes Relative 9 %   Monocytes Absolute 0.6 0.1 - 1.0 K/uL   Eosinophils Relative 0 %   Eosinophils Absolute 0.0 0.0 - 0.5 K/uL   Basophils Relative 1 %   Basophils Absolute 0.0 0.0 - 0.1 K/uL   Immature Granulocytes 1 %   Abs Immature Granulocytes 0.06 0.00 - 0.07 K/uL    Comment: Performed at [facility], [address] [place], [place] [address]  Reticulocytes     Status: Abnormal   Collection Time: 12/15/18 11:08 AM  Result Value Ref Range   Retic Ct Pct 3.5 (H) 0.4 - 3.1 %   RBC. 2.44 (L) 4.22 - 5.81 MIL/uL   Retic Count, Absolute 86.4 19.0 - 186.0 K/uL   Immature Retic Fract 27.1 (H) 2.3 - 15.9 %    Comment: Performed at [facility], [address] [place], [place] [address]  Type and screen     Status: None   Collection Time: 12/15/18 12:53 PM  Result Value Ref Range   ABO/RH(D) A POS    Antibody Screen NEG    Sample Expiration      12/18/2018 Performed at [facility], [address]  [place], [place] [address]  No results found.  Pending Labs Unresulted Labs (From admission, onward)    Start     Ordered   12/15/18 1441  ABO/Rh  ONCE - STAT,   STAT     12/15/18 1440   12/15/18 1330  Haptoglobin  Once,   R     12/15/18 1330   12/15/18 1231  Prepare RBC  (Adult Blood Administration - PRBC)  Once,   R    Question Answer Comment  # of Units 1 unit   Transfusion Indications Symptomatic Anemia   If emergent release call blood bank Not emergent release      12/15/18 1230   12/15/18 1030  Urinalysis, Complete w Microscopic  Once,   STAT     12/15/18 1029   12/15/18 1022  CBC with Differential  (ALOC)  Once,   STAT     12/15/18 1021   Signed and Held  TSH  Once,   R     Signed and Held   Signed and Held  CBC  Tomorrow morning,   R     Signed and Held   Signed and Held  Basic metabolic panel  Tomorrow morning,   R     Signed and Held          Vitals/Pain Today's Vitals   12/15/18 1230 12/15/18 1245 12/15/18 1300 12/15/18 1430  BP: (!) 179/88  (!) 191/97 (!) 170/98  Pulse: 61 64 67 67  Resp: (!) 8 (!) 8 11 13   Temp:      TempSrc:      SpO2: 99% 98% 96% 98%  Weight:      Height:      PainSc:        Isolation Precautions No active isolations  Medications Medications  0.9 %  sodium chloride infusion (has no administration in time range)  potassium chloride SA (K-DUR) CR tablet 40 mEq (has no administration in time range)  0.9 %  sodium chloride infusion ( Intravenous Stopped 12/15/18 1251)    Mobility walks with device Low fall risk   Focused Assessments Cardiac Assessment Handoff:  Cardiac Rhythm: Normal sinus rhythm Lab Results  Component Value Date   CKMB 14.6 (H) 10/27/2013   TROPONINI <0.03 12/15/2018   Lab Results  Component Value Date   DDIMER 0.27 07/29/2012   Does the Patient currently have chest pain? No     R Recommendations: See Admitting Provider Note  Report given to:   Additional Notes: PT became nauseated sitting up  to urinate, no emesis

## 2018-12-15 NOTE — Patient Outreach (Signed)
[facility]) Care Management  12/15/2018  NOLBERTO CHEUVRONT [birth year] [id number]   Care Coordination call  New referral received today.   Spoke with Marshell Garfinkel, [facility] Case Manager regarding new patient referral. Patient currently in ED and will  Admitted to floor for  observation today. She provided contact number of Case manager to follow patient on tomorrow, Anderson Malta .   Plan Will follow patient progress and collaborate with inpatient case manager as needed regarding d/c disposition.    Joylene Draft, RN, [place] [facility] Management Coordinator  [phone]- Mobile [phone]- Toll Free Main Office

## 2018-12-15 NOTE — Telephone Encounter (Signed)
Please call to see if he as gone to ED as recommended  I do not see an ED note in epic, but maybe be went to [facility] or [facility]?  Thanks

## 2018-12-15 NOTE — H&P (Signed)
[facility] [place] NAME: William Taylor    MR#:  [id number]  DATE OF BIRTH:  Apr 24, 1930  DATE OF ADMISSION:  12/15/2018  PRIMARY CARE PHYSICIAN: [name], MD   REQUESTING/REFERRING PHYSICIAN: Esperanza Heir, MD  CHIEF COMPLAINT:   Chief Complaint  Patient presents with  . Hypotension    orthostatic    HISTORY OF PRESENT ILLNESS: Momin Misko  is a 85 y.o. male with a known history of prostate cancer, coronary artery disease, hyperlipidemia, hypertension, coronary artery disease, paroxysmal atrial fibrillation who is presenting to the emergency room with complaint of dizziness and orthostatic hypotension.  Patient states that his been feeling weak for for long period of time and with progressively gotten worse.  And apparently was seen by his primary care doctor and had a drop in his blood pressure therefore he was told to come to the emergency room.    PAST MEDICAL HISTORY:   Past Medical History:  Diagnosis Date  . Cancer [facility])    Prostate, followed by Dr. Jacqlyn Larsen  . Colon polyp   . Coronary artery disease   . Hyperlipidemia   . Hypertension   . MI (myocardial infarction) ([place])    2015  . PAF (paroxysmal atrial fibrillation) ([place])    a. on xarelto  . Skin cancer     PAST SURGICAL HISTORY:  Past Surgical History:  Procedure Laterality Date  . CARDIAC CAT[facility]IZATION  [facility]   [facility]  . CATARACT EXTRACTION  Oct. 3, 2012   right eye  . colonoscopy    . COLONOSCOPY W/ POLYPECTOMY  2015   Dr Rayann Heman  . COLONOSCOPY WITH PROPOFOL N/A 08/15/2016   Procedure: COLONOSCOPY WITH PROPOFOL;  Surgeon: [name], MD;  Location: [facility] ENDOSCOPY;  Service: Endoscopy;  Laterality: N/A;  . CORONARY ANGIOPLASTY  2009   2005; s/p stent  . ENTEROSCOPY N/A 09/22/2018   Procedure: ENTEROSCOPY;  Surgeon: Lin Landsman, MD;  Location: [place];  Service: Gastroenterology;  Laterality: N/A;  . ESOPHAGOGASTRODUODENOSCOPY (EGD) WITH  PROPOFOL N/A 08/15/2016   Procedure: ESOPHAGOGASTRODUODENOSCOPY (EGD) WITH PROPOFOL;  Surgeon: [name], MD;  Location: [facility] ENDOSCOPY;  Service: Endoscopy;  Laterality: N/A;  . GIVENS CAPSULE STUDY N/A 09/26/2016   Procedure: GIVENS CAPSULE STUDY;  Surgeon: [name], MD;  Location: [facility] ENDOSCOPY;  Service: Endoscopy;  Laterality: N/A;  . HERNIA REPAIR  2013  . LUMBAR SPINE SURGERY    . UPPER GI ENDOSCOPY  Sept 2015   Dr Rayann Heman    SOCIAL HISTORY:  Social History   Tobacco Use  . Smoking status: Never Smoker  . Smokeless tobacco: Never Used  Substance Use Topics  . Alcohol use: No    FAMILY HISTORY:  Family History  Problem Relation Age of Onset  . Stroke Mother   . Colon cancer Father   . Coronary artery disease Brother   . Other Brother 49       lymes dz    DRUG ALLERGIES:  Allergies  Allergen Reactions  . Bee Venom Swelling  . Sulfasalazine Hives and Swelling  . Sulfa Antibiotics Hives and Swelling  . Warfarin And Related Other (See Comments)    Chest pain    REVIEW OF SYSTEMS:   CONSTITUTIONAL: No fever, positive fatigue or positive weakness.  EYES: No blurred or double vision.  EARS, NOSE, AND THROAT: No tinnitus or ear pain.  RESPIRATORY: No cough, shortness of breath, wheezing or hemoptysis.  CARDIOVASCULAR: No chest pain, orthopnea, edema.  GASTROINTESTINAL: No nausea, vomiting, diarrhea or abdominal pain.  GENITOURINARY: No dysuria, hematuria.  ENDOCRINE: No polyuria, nocturia,  HEMATOLOGY: No anemia, easy bruising or bleeding SKIN: No rash or lesion. MUSCULOSKELETAL: No joint pain or arthritis.   NEUROLOGIC: No tingling, numbness, weakness.  PSYCHIATRY: No anxiety or depression.   MEDICATIONS AT HOME:  Prior to Admission medications   Medication Sig Start Date End Date Taking? Authorizing Provider  amiodarone (PACERONE) 200 MG tablet Take 1 tablet (200 mg total) by mouth daily. 09/24/18  Yes Salary, Avel Peace, MD  Droxidopa (NORTHERA) 100 MG CAPS  Take 600 mg by mouth 3 (three) times daily. Patient taking differently: Take 400 mg by mouth 3 (three) times daily.  10/13/18  Yes [name], MD  feeding supplement, ENSURE ENLIVE, (ENSURE ENLIVE) LIQD Take 237 mLs by mouth daily. 09/23/18  Yes Salary, Avel Peace, MD  fenofibrate micronized (ANTARA) 130 MG capsule TAKE 1 CAPSULE BY MOUTH  DAILY BEFORE BREAKFAST 08/24/18  Yes [name], MD  finasteride (PROSCAR) 5 MG tablet Take 1 tablet (5 mg total) by mouth daily. 10/16/18  Yes Stoioff, Ronda Fairly, MD  fludrocortisone (FLORINEF) 0.1 MG tablet Take 1 tablet (0.1 mg total) by mouth daily. 10/14/18  Yes Stoioff, Ronda Fairly, MD  levobunolol (BETAGAN) 0.5 % ophthalmic solution Place 1 drop into the right eye 2 (two) times daily.    Yes [provider]  levothyroxine (SYNTHROID, LEVOTHROID) 50 MCG tablet Take 1 tablet (50 mcg total) by mouth daily at 6 (six) AM. 09/24/18  Yes Salary, Montell D, MD  midodrine (PROAMATINE) 10 MG tablet Take 1 tablet (10 mg total) by mouth 3 (three) times daily. 11/18/18  Yes [name], MD  midodrine (PROAMATINE) 5 MG tablet Take 1 tablet (5 mg) three times a day as needed 11/18/18  Yes Gollan, Kathlene November, MD  nitroGLYCERIN (NITROSTAT) 0.4 MG SL tablet DISSOLVE UNDER THE TONGUE 1 TABLET EVERY 5 MINUTES AS NEEDED FOR CHEST PAIN 06/11/16  Yes Gollan, Kathlene November, MD  ondansetron (ZOFRAN-ODT) 8 MG disintegrating tablet Take 0.5 tablets (4 mg total) by mouth every 8 (eight) hours as needed for nausea. 09/16/18  Yes Guse, Jacquelynn Cree, FNP  pantoprazole (PROTONIX) 40 MG tablet Take 1 tablet (40 mg total) by mouth 2 (two) times daily. 11/16/18 02/14/19 Yes [name], MD  potassium chloride (K-DUR) 10 MEQ tablet Take 1 tablet (10 mEq total) by mouth daily. 11/02/18 01/31/19 Yes Gollan, Kathlene November, MD  pravastatin (PRAVACHOL) 40 MG tablet TAKE 1 TABLET BY MOUTH AT  BEDTIME 06/08/18  Yes [name], MD  pyridostigmine (MESTINON) 180 MG CR tablet Take 1 tablet (180 mg total)  by mouth daily. 12/14/18  Yes Gollan, Kathlene November, MD  QC NATURA-LAX powder MIX AND TAKE 17 GM (LINE ON INSIDE OF LID) TWICE DAILY AS NEEDED 12/04/18  Yes [name], MD  ranolazine (RANEXA) 500 MG 12 hr tablet Take 1 tablet (500 mg total) by mouth 2 (two) times daily. 11/26/18  Yes Gollan, Kathlene November, MD  tamsulosin (FLOMAX) 0.4 MG CAPS capsule Take 0.4 mg by mouth as needed.   Yes [provider]  Vitamin D, Ergocalciferol, (DRISDOL) 1.25 MG (50000 UT) CAPS capsule Take 50,000 Units by mouth every 7 (seven) days.  10/30/18 12/19/18 Yes [provider]  XARELTO 20 MG TABS tablet TAKE 1 TABLET BY MOUTH  DAILY 11/06/18  Yes Gollan, Kathlene November, MD      PHYSICAL EXAMINATION:   VITAL SIGNS: Blood pressure Marland Kitchen)  170/98, pulse 67, temperature 98.7 F (37.1 C), temperature source Oral, resp. rate 13, height 6\' 4"  (1.93 m), weight 81.6 kg, SpO2 98 %.  GENERAL:  85 y.o.-year-old patient lying in the bed with no acute distress.  EYES: Pupils equal, round, reactive to light and accommodation. No scleral icterus. Extraocular muscles intact.  HEENT: Head atraumatic, normocephalic. Oropharynx and nasopharynx clear.  NECK:  Supple, no jugular venous distention. No thyroid enlargement, no tenderness.  LUNGS: Normal breath sounds bilaterally, no wheezing, rales,rhonchi or crepitation. No use of accessory muscles of respiration.  CARDIOVASCULAR: S1, S2 normal. No murmurs, rubs, or gallops.  ABDOMEN: Soft, nontender, nondistended. Bowel sounds present. No organomegaly or mass.  EXTREMITIES: No pedal edema, cyanosis, or clubbing.  NEUROLOGIC: Cranial nerves II through XII are intact. Muscle strength 5/5 in all extremities. Sensation intact. Gait not checked.  PSYCHIATRIC: The patient is alert and oriented x 3.  SKIN: No obvious rash, lesion, or ulcer.   LABORATORY PANEL:   CBC Recent Labs  Lab 12/15/18 1108  WBC 6.5  HGB 8.9*  HCT 25.2*  PLT 310  MCV 107.2*  MCH 37.9*  MCHC 35.3  RDW  13.6  LYMPHSABS 1.0  MONOABS 0.6  EOSABS 0.0  BASOSABS 0.0   ------------------------------------------------------------------------------------------------------------------  Chemistries  Recent Labs  Lab 12/15/18 1022  NA 134*  K 3.4*  CL 99  CO2 25  GLUCOSE 144*  BUN 15  CREATININE 1.17  CALCIUM 9.2  AST 33  ALT 12  ALKPHOS 32*  BILITOT 2.2*   ------------------------------------------------------------------------------------------------------------------ estimated creatinine clearance is 51.3 mL/min (by C-G formula based on SCr of 1.17 mg/dL). ------------------------------------------------------------------------------------------------------------------ No results for input(s): TSH, T4TOTAL, T3FREE, THYROIDAB in the last 72 hours.  Invalid input(s): FREET3   Coagulation profile No results for input(s): INR, PROTIME in the last 168 hours. ------------------------------------------------------------------------------------------------------------------- No results for input(s): DDIMER in the last 72 hours. -------------------------------------------------------------------------------------------------------------------  Cardiac Enzymes Recent Labs  Lab 12/15/18 1022  TROPONINI <0.03   ------------------------------------------------------------------------------------------------------------------ Invalid input(s): POCBNP  ---------------------------------------------------------------------------------------------------------------  Urinalysis    Component Value Date/Time   COLORURINE YELLOW (A) 09/16/2018 2302   APPEARANCEUR CLEAR (A) 09/16/2018 2302   APPEARANCEUR Hazy 02/27/2014 1335   LABSPEC 1.014 09/16/2018 2302   LABSPEC 1.017 02/27/2014 1335   PHURINE 5.0 09/16/2018 2302   GLUCOSEU NEGATIVE 09/16/2018 2302   GLUCOSEU Negative 02/27/2014 1335   HGBUR NEGATIVE 09/16/2018 2302   BILIRUBINUR NEGATIVE 09/16/2018 2302   BILIRUBINUR neg  03/01/2014 1334   BILIRUBINUR Negative 02/27/2014 1335   KETONESUR NEGATIVE 09/16/2018 2302   PROTEINUR NEGATIVE 09/16/2018 2302   UROBILINOGEN 0.2 03/01/2014 1334   NITRITE NEGATIVE 09/16/2018 2302   LEUKOCYTESUR NEGATIVE 09/16/2018 2302   LEUKOCYTESUR Negative 02/27/2014 1335     RADIOLOGY: No results found.  EKG: Orders placed or performed during the hospital encounter of 12/15/18  . EKG 12-Lead  . EKG 12-Lead    IMPRESSION AND PLAN: Patient is 85 year old with history of prostate cancer and coronary artery disease and hyperlipidemia presenting with generalized weakness and orthostatic hypotension  1.  Orthostatic hypotension patient already on midodrine will continue that I will recheck orthostatics I will also have PT see the patient  2.  Atrial fibrillation continue amiodarone and Xarelto  3.  Worsening anemia the ED physician discussed the case with patient's primary oncologist who recommended patient get transfused 1 unit of packed RBCs which the ED physician has already ordered  4.  BPH continue Proscar  5.  GERD continue Protonix  6.  Hyperlipidemia continue  Pravachol    All the records are reviewed and case discussed with ED provider. Management plans discussed with the patient, family and they are in agreement.  CODE STATUS: Code Status History    Date Active Date Inactive Code Status Order ID Comments User Context   09/16/2018 5329 09/23/2018 1639 DNR [id number]  Vaughan Basta, MD Inpatient   03/07/2017 1810 03/09/2017 1638 Full Code [id number]  Lance Coon, MD Inpatient   07/06/2015 2225 07/07/2015 1914 Full Code [id number]  Hower, Aaron Mose, MD ED    Questions for Most Recent Historical Code Status (Order [id number])    Question Answer Comment   In the event of cardiac or respiratory ARREST Do not call a "code blue"    In the event of cardiac or respiratory ARREST Do not perform Intubation, CPR, defibrillation or ACLS    In the event of cardiac or  respiratory ARREST Use medication by any route, position, wound care, and other measures to relive pain and suffering. May use oxygen, suction and manual treatment of airway obstruction as needed for comfort.        TOTAL TIME TAKING CARE OF THIS PATIENT: 55 minutes.    Dustin Flock M.D on 12/15/2018 at 3:04 PM  Between 7am to 6pm - Pager - [phone]  After 6pm go to [email or web address] - password [email or web address]  [employer] Office  [phone]  CC: Primary care physician; [name], MD

## 2018-12-15 NOTE — Progress Notes (Signed)
Advanced care plan.  Purpose of the Encounter: CODE STATUS  Parties in Attendance: patient him self   Patient's Decision Capacity:intact  Subjective/Patient's story: William Taylor  is a 84 y.o. male with a known history of prostate cancer, coronary artery disease, hyperlipidemia, hypertension, coronary artery disease, paroxysmal atrial fibrillation who is presenting to the emergency room with complaint of dizziness and orthostatic hypotension.  Patient states that his been feeling weak for for long period of time and with progressively gotten worse.  And apparently was seen by his primary care doctor and had a drop in his blood pressure therefore he was told to come to the emergency room.    Objective/Medical story Discussed with the patient regarding his desires for cardiac and pulmonary resuscitation. Also asked him if he had a living will or healthcare power of attorney  Goals of care determination:   Patient was previously DNR now he wants to be a full code  CODE STATUS:  Full code  Time spent discussing advanced care planning: 16 minutes

## 2018-12-15 NOTE — ED Notes (Signed)
Attempted to call report

## 2018-12-15 NOTE — ED Provider Notes (Addendum)
[facility] Emergency Department Provider Note       Time seen: ----------------------------------------- 10:22 AM on 12/15/2018 -----------------------------------------   I have reviewed the triage vital signs and the nursing notes.  HISTORY   Chief Complaint Hypotension (orthostatic)    HPI William Taylor is a 85 y.o. male with a history of prostate cancer, coronary disease, hyperlipidemia, hypertension, MI, paroxysmal atrial fibrillation who presents to the ED for hypotension.  Patient was sent by his primary care doctor, has had a drop in his blood pressure when he changes positions.  He was advised to come here by his doctor for IV hydration.  Patient has had persistent nausea with dry heaving since his admission in January.    Past Medical History:  Diagnosis Date  . Cancer [facility])    Prostate, followed by Dr. Jacqlyn Larsen  . Colon polyp   . Coronary artery disease   . Hyperlipidemia   . Hypertension   . MI (myocardial infarction) ([place])    2015  . PAF (paroxysmal atrial fibrillation) ([place])    a. on xarelto  . Skin cancer     Patient Active Problem List   Diagnosis Date Noted  . Gastrointestinal hemorrhage with melena   . Malnutrition of moderate degree 09/21/2018  . Orthostatic hypotension 06/06/2018  . Chronic pain of left knee 06/05/2018  . Chronic pain in testicle 06/05/2018  . Chronic back pain 02/17/2018  . Chest pain 03/07/2017  . Angiodysplasia of intestinal tract   . Iron deficiency anemia   . Benign neoplasm of ascending colon   . Diverticulosis of large intestine without diverticulitis   . Gastric polyp   . Left rotator cuff tear arthropathy 09/11/2015  . Fatigue 04/15/2014  . Groin pain, chronic, left 01/18/2014  . Prostate cancer ([place]) 03/15/2013  . DOE (dyspnea on exertion) 11/23/2012  . Insomnia 09/03/2012  . CKD (chronic kidney disease), stage III ([place]) 07/23/2011  . Pulmonary embolism ([place]) 01/09/2011  . Paroxysmal A-fib  ([place]) 09/06/2010  . Mixed hyperlipidemia 05/19/2009  . Coronary artery disease of native artery of native heart with stable angina pectoris ([place]) 05/19/2009    Past Surgical History:  Procedure Laterality Date  . [facility]IZATION  [facility]   [facility]  . CATARACT EXTRACTION  Oct. 3, 2012   right eye  . colonoscopy    . COLONOSCOPY W/ POLYPECTOMY  2015   Dr Rayann Heman  . COLONOSCOPY WITH PROPOFOL N/A 08/15/2016   Procedure: COLONOSCOPY WITH PROPOFOL;  Surgeon: [name], MD;  Location[facility] ENDOSCOPY;  Service: Endoscopy;  Laterality: N/A;  . CORONARY ANGIOPLASTY  2009   2005; s/p stent  . ENTEROSCOPY N/A 09/22/2018   Procedure: ENTEROSCOPY;  Surgeon: Lin Landsman, MD;  Location: [place];  Service: Gastroenterology;  Laterality: N/A;  . ESOPHAGOGASTRODUODENOSCOPY (EGD) WITH PROPOFOL N/A 08/15/2016   Procedure: ESOPHAGOGASTRODUODENOSCOPY (EGD) WITH PROPOFOL;  Surgeon: [name], MD;  Location: [facility] ENDOSCOPY;  Service: Endoscopy;  Laterality: N/A;  . GIVENS CAPSULE STUDY N/A 09/26/2016   Procedure: GIVENS CAPSULE STUDY;  Surgeon: [name], MD;  Location: [facility] ENDOSCOPY;  Service: Endoscopy;  Laterality: N/A;  . HERNIA REPAIR  2013  . LUMBAR SPINE SURGERY    . UPPER GI ENDOSCOPY  Sept 2015   Dr Rayann Heman    Allergies Bee venom; Sulfasalazine; Sulfa antibiotics; and Warfarin and related  Social History Social History   Tobacco Use  . Smoking status: Never Smoker  . Smokeless tobacco: Never Used  Substance Use Topics  . Alcohol  use: No  . Drug use: No   Review of Systems Constitutional: Negative for fever. Cardiovascular: Negative for chest pain. Respiratory: Negative for shortness of breath. Gastrointestinal: Negative for abdominal pain, vomiting and diarrhea. Musculoskeletal: Negative for back pain. Skin: Negative for rash. Neurological: Negative for headaches, positive for weakness, particularly with standing  All systems negative/normal/unremarkable except as  stated in the HPI  ____________________________________________   PHYSICAL EXAM:  VITAL SIGNS: ED Triage Vitals  Enc Vitals Group     BP 12/15/18 1004 (!) 152/93     Pulse Rate 12/15/18 1004 79     Resp 12/15/18 1004 16     Temp 12/15/18 1004 98.7 F (37.1 C)     Temp Source 12/15/18 1004 Oral     SpO2 12/15/18 1004 100 %     Weight 12/15/18 1005 180 lb (81.6 kg)     Height 12/15/18 1005 6\' 4"  (1.93 m)     Head Circumference --      Peak Flow --      Pain Score 12/15/18 1005 0     Pain Loc --      Pain Edu? --      Excl. in [place]? --    Constitutional: Alert and oriented. Well appearing and in no distress. Eyes: Conjunctivae are normal. Normal extraocular movements. ENT      Head: Normocephalic and atraumatic.      Nose: No congestion/rhinnorhea.      Mouth/Throat: Mucous membranes are moist.      Neck: No stridor. Cardiovascular: Normal rate, regular rhythm. No murmurs, rubs, or gallops. Respiratory: Normal respiratory effort without tachypnea nor retractions. Breath sounds are clear and equal bilaterally. No wheezes/rales/rhonchi. Gastrointestinal: Soft and nontender. Normal bowel sounds Musculoskeletal: Nontender with normal range of motion in extremities. No lower extremity tenderness nor edema. Neurologic:  Normal speech and language. No gross focal neurologic deficits are appreciated.  Skin:  Skin is warm, dry and intact. No rash noted. Psychiatric: Mood and affect are normal. Speech and behavior are normal.  ____________________________________________  EKG: Interpreted by me.  Sinus rhythm rate of 79 bpm, possible left anterior fascicular block, long QT, prolonged PR interval  ____________________________________________  ED COURSE:  As part of my medical decision making, I reviewed the following data within the [place] History obtained from family if available, nursing notes, old chart and ekg, as well as notes from prior ED visits. Patient  presented for hypotension, we will assess with labs and imaging as indicated at this time.   Procedures  BAO BAZEN was evaluated in Emergency Department on 12/15/2018 for the symptoms described in the history of present illness. He was evaluated in the context of the global COVID-19 pandemic, which necessitated consideration that the patient might be at risk for infection with the SARS-CoV-2 virus that causes COVID-19. Institutional protocols and algorithms that pertain to the evaluation of patients at risk for COVID-19 are in a state of rapid change based on information released by regulatory bodies including the CDC and federal and state organizations. These policies and algorithms were followed during the patient's care in the ED.  ____________________________________________   LABS (pertinent positives/negatives)  Labs Reviewed  COMPREHENSIVE METABOLIC PANEL - Abnormal; Notable for the following components:      Result Value   Sodium 134 (*)    Potassium 3.4 (*)    Glucose, Bld 144 (*)    Total Protein 6.4 (*)    Alkaline Phosphatase 32 (*)  Total Bilirubin 2.2 (*)    GFR calc non Af Amer 56 (*)    All other components within normal limits  CBC WITH DIFFERENTIAL/PLATELET - Abnormal; Notable for the following components:   RBC 2.35 (*)    Hemoglobin 8.9 (*)    HCT 25.2 (*)    MCV 107.2 (*)    MCH 37.9 (*)    All other components within normal limits  TROPONIN I  CBC WITH DIFFERENTIAL/PLATELET  URINALYSIS, COMPLETE (UACMP) WITH MICROSCOPIC  HAPTOGLOBIN  RETICULOCYTES  CBG MONITORING, ED  PREPARE RBC (CROSSMATCH)  TYPE AND SCREEN  ____________________________________________  CRITICAL CARE Performed by: Laurence Aly   Total critical care time: 30 minutes  Critical care time was exclusive of separately billable procedures and treating other patients.  Critical care was necessary to treat or prevent imminent or life-threatening deterioration.  Critical  care was time spent personally by me on the following activities: development of treatment plan with patient and/or surrogate as well as nursing, discussions with consultants, evaluation of patient's response to treatment, examination of patient, obtaining history from patient or surrogate, ordering and performing treatments and interventions, ordering and review of laboratory studies, ordering and review of radiographic studies, pulse oximetry and re-evaluation of patient's condition.    DIFFERENTIAL DIAGNOSIS   Orthostatic hypotension, dehydration, electrolyte abnormality, medication side effect  FINAL ASSESSMENT AND PLAN  Orthostatic hypotension, anemia   Plan: The patient had presented for low blood pressure. Patient's labs did reveal a drop in his hemoglobin to 8.9 compared to prior.  Otherwise his testing was reassuring.  I had discussed with his medical oncologist as well as cardiology.  He has a complicated case and it is difficult to determine the best course of treatment for him.  I have ordered a unit of blood for transfusion.   Laurence Aly, MD    Note: This note was generated in part or whole with voice recognition software. Voice recognition is usually quite accurate but there are transcription errors that can and very often do occur. I apologize for any typographical errors that were not detected and corrected.     Earleen Newport, MD 12/15/18 1240    Earleen Newport, MD 01/04/19 0930

## 2018-12-15 NOTE — Telephone Encounter (Signed)
Called and spoke with patient wife and she said hat the plan wa to take patient to the hospital this morning that he " just felt to bad yesterday "nd I said I would take him today" . Patient wife stated he is very weak and stays nauseated all the time eating and drinking very little , advised her she should get him up and take him to the ED now. Patient wife said she would take him as soon as she can get dressed and get patient dressed , advised her if patient is hard to arouse she should call 911, she refused 911 at this time and said I will take him to the ER,

## 2018-12-15 NOTE — ED Notes (Signed)
Pt reports dizziness upon standing x3 months, has been seen at PCP and admitted to hospital for the same without relief. Pt denies any falls at home. Reports decreased appetite and nausea, denies vomiting.

## 2018-12-15 NOTE — Psychosocial Assessment (Signed)
Notified MD of pt's blood pressure. Orders placed. Will continue to monitor and assess.

## 2018-12-15 NOTE — Care Management (Signed)
Landis Martins RN [phone] with [facility] will assist.

## 2018-12-15 NOTE — ED Triage Notes (Signed)
Pt sent by his PCP due to a drop in blood pressure with change of position;per pt report. Pt called PCP who suggested pt come to the ED for IV hydration. PT reports a recent admission to the hospital for the same complaints and states "it never got better." No complaints of pain.

## 2018-12-15 NOTE — Care Management (Addendum)
Patient is readmission and per last note, patient referral to [facility] home health.  Message sent to [facility] with Alvis Lemmings to check status. Update at 1308: per [facility] with Alvis Lemmings, patient was discharged from their services on March 4 and they will be unable to accept patient back for services.  He provided no update on needs.

## 2018-12-15 NOTE — Telephone Encounter (Signed)
Patient did go to ER

## 2018-12-15 NOTE — ED Notes (Signed)
Pt assisted to side of bed to use urinal, requested nurse to step out of room

## 2018-12-15 NOTE — Telephone Encounter (Signed)
Patient went to ER 12/15/2018

## 2018-12-16 ENCOUNTER — Telehealth: Payer: Self-pay | Admitting: Cardiovascular Disease

## 2018-12-16 DIAGNOSIS — I951 Orthostatic hypotension: Principal | ICD-10-CM

## 2018-12-16 DIAGNOSIS — D649 Anemia, unspecified: Secondary | ICD-10-CM | POA: Diagnosis not present

## 2018-12-16 DIAGNOSIS — I480 Paroxysmal atrial fibrillation: Secondary | ICD-10-CM | POA: Diagnosis not present

## 2018-12-16 DIAGNOSIS — I4891 Unspecified atrial fibrillation: Secondary | ICD-10-CM | POA: Diagnosis not present

## 2018-12-16 DIAGNOSIS — I2510 Atherosclerotic heart disease of native coronary artery without angina pectoris: Secondary | ICD-10-CM

## 2018-12-16 DIAGNOSIS — N400 Enlarged prostate without lower urinary tract symptoms: Secondary | ICD-10-CM | POA: Diagnosis not present

## 2018-12-16 LAB — BASIC METABOLIC PANEL
Anion gap: 9 (ref 5–15)
BUN: 15 mg/dL (ref 8–23)
CO2: 23 mmol/L (ref 22–32)
Calcium: 8.7 mg/dL — ABNORMAL LOW (ref 8.9–10.3)
Chloride: 105 mmol/L (ref 98–111)
Creatinine, Ser: 0.82 mg/dL (ref 0.61–1.24)
GFR calc Af Amer: >60 mL/min (ref >60)
GFR calc non Af Amer: >60 mL/min (ref >60)
Glucose, Bld: 116 mg/dL — ABNORMAL HIGH (ref 70–99)
Potassium: 3.2 mmol/L — ABNORMAL LOW (ref 3.5–5.1)
Sodium: 137 mmol/L (ref 135–145)

## 2018-12-16 LAB — HEPATIC FUNCTION PANEL
ALT: 11 U/L (ref 0–44)
AST: 28 U/L (ref 15–41)
Albumin: 3.3 g/dL — ABNORMAL LOW (ref 3.5–5.0)
Alkaline Phosphatase: 30 U/L — ABNORMAL LOW (ref 38–126)
Bilirubin, Direct: 0.9 mg/dL — ABNORMAL HIGH (ref 0.0–0.2)
Indirect Bilirubin: 2 mg/dL — ABNORMAL HIGH (ref 0.3–0.9)
Total Bilirubin: 2.9 mg/dL — ABNORMAL HIGH (ref 0.3–1.2)
Total Protein: 5.7 g/dL — ABNORMAL LOW (ref 6.5–8.1)

## 2018-12-16 LAB — CBC
HCT: 28.6 % — ABNORMAL LOW (ref 39.0–52.0)
Hemoglobin: 9.6 g/dL — ABNORMAL LOW (ref 13.0–17.0)
MCH: 34.9 pg — ABNORMAL HIGH (ref 26.0–34.0)
MCHC: 33.6 g/dL (ref 30.0–36.0)
MCV: 104 fL — ABNORMAL HIGH (ref 80.0–100.0)
Platelets: 246 10*3/uL (ref 150–400)
RBC: 2.75 MIL/uL — ABNORMAL LOW (ref 4.22–5.81)
RDW: 15 % (ref 11.5–15.5)
WBC: 8.4 10*3/uL (ref 4.0–10.5)
nRBC: 0 % (ref 0.0–0.2)

## 2018-12-16 LAB — HAPTOGLOBIN: Haptoglobin: <10 mg/dL — ABNORMAL LOW (ref 38–329)

## 2018-12-16 LAB — CORTISOL: Cortisol, Plasma: 15.2 ug/dL

## 2018-12-16 MED ORDER — ENSURE ENLIVE PO LIQD
237.0000 mL | Freq: Two times a day (BID) | ORAL | Status: DC
  Administered 2018-12-18: 237 mL via ORAL

## 2018-12-16 MED ORDER — ADULT MULTIVITAMIN W/MINERALS CH
1.0000 | ORAL_TABLET | Freq: Every day | ORAL | Status: DC
  Administered 2018-12-17 – 2018-12-20 (×4): 1 via ORAL
  Filled 2018-12-16 (×4): qty 1

## 2018-12-16 MED ORDER — COSYNTROPIN 0.25 MG IJ SOLR
0.2500 mg | Freq: Once | INTRAMUSCULAR | Status: AC
  Administered 2018-12-17: 0.25 mg via INTRAVENOUS
  Filled 2018-12-16 (×2): qty 0.25

## 2018-12-16 MED ORDER — FLUDROCORTISONE ACETATE 0.1 MG PO TABS
0.2000 mg | ORAL_TABLET | Freq: Every day | ORAL | Status: DC
  Administered 2018-12-17 – 2018-12-20 (×4): 0.2 mg via ORAL
  Filled 2018-12-16 (×4): qty 2

## 2018-12-16 MED ORDER — AMIODARONE HCL 200 MG PO TABS
100.0000 mg | ORAL_TABLET | Freq: Every day | ORAL | Status: DC
  Administered 2018-12-17 – 2018-12-20 (×4): 100 mg via ORAL
  Filled 2018-12-16 (×4): qty 1

## 2018-12-16 MED ORDER — POTASSIUM CHLORIDE CRYS ER 20 MEQ PO TBCR
40.0000 meq | EXTENDED_RELEASE_TABLET | ORAL | Status: AC
  Administered 2018-12-16 (×2): 40 meq via ORAL
  Filled 2018-12-16 (×2): qty 2

## 2018-12-16 MED ORDER — COSYNTROPIN 0.25 MG IJ SOLR: 0.2500 mg | Freq: Once | INTRAMUSCULAR | Status: DC

## 2018-12-16 MED ORDER — COSYNTROPIN NICU IV SYRINGE 0.25 MG/ML (STANDARD DOSE): 0.2500 mg | Freq: Once | INTRAVENOUS | Status: DC

## 2018-12-16 MED ORDER — FLUDROCORTISONE ACETATE 0.1 MG PO TABS
0.1000 mg | ORAL_TABLET | Freq: Once | ORAL | Status: AC
  Administered 2018-12-16: 0.1 mg via ORAL
  Filled 2018-12-16: qty 1

## 2018-12-16 MED ORDER — SODIUM CHLORIDE 0.9 % IV SOLN
INTRAVENOUS | Status: DC
  Administered 2018-12-16 – 2018-12-18 (×4): via INTRAVENOUS

## 2018-12-16 NOTE — Progress Notes (Signed)
Initial Nutrition Assessment  DOCUMENTATION CODES:   Not applicable  INTERVENTION:   Ensure Enlive po BID, each supplement provides 350 kcal and 20 grams of protein  MVI daily   Liberalize diet   Suspect pt at moderate to high refeed risk; recommend monitor K, Mg and P labs daily once oral intake improves.   NUTRITION DIAGNOSIS:   Unintentional weight loss related to chronic illness(prostate cancer, advanced age ) as evidenced by 70 percent weight loss in 6 months  GOAL:   Patient will meet greater than or equal to 90% of their needs  MONITOR:   PO intake, Supplement acceptance, Labs, Weight trends, I & O's, Skin  REASON FOR ASSESSMENT:   Malnutrition Screening Tool    ASSESSMENT:   85 y.o. male with a known history of prostate cancer, coronary artery disease, hyperlipidemia, hypertension, coronary artery disease, paroxysmal atrial fibrillation who is presenting to the emergency room with complaint of dizziness and orthostatic hypotension.    RD working remotely.  RD familiar with this patient from recent previous admit. Pt previously eating 100% of meals during his last admit in February. Pt does report drinking one Ensure per day at home. Per chart, pt has lost 39lbs(18%) over the past six months and 21lbs(10%) over the past 3 months; this is significant. Per chart review, pt reports poor appetite and oral intake over the past 2 months r/t nausea and dry heaving. RD will add supplements and liberalize diet to help pt meet his estimated needs. Suspect pt at moderate to high refeed risk; recommend monitor K, Mg and P labs daily once oral intake improves.   Pt previously diagnosed with moderate chronic malnutrition during last admit; suspect this may have progressed to severe malnutrition but unable to diagnose without NFPE.   Medications reviewed and include: synthroid, MVI, protonix, NaCl @75ml /hr  Labs reviewed: K 3.2(L)bili 2.9(H) Hgb 9.6(L), Hct 28.6(L), MCV 104(H),  MCH 34.9(H)  Unable to complete Nutrition-Focused physical exam at this time.   Diet Order:   Diet Order            Diet regular Room service appropriate? Yes; Fluid consistency: Thin  Diet effective now             EDUCATION NEEDS:   No education needs have been identified at this time  Skin:  Skin Assessment: Reviewed RN Assessment(ecchymosis)  Last BM:  4/29- type 5  Height:   Ht Readings from Last 1 Encounters:  12/15/18 6\' 4"  (1.93 m)    Weight:   Wt Readings from Last 1 Encounters:  12/15/18 83.3 kg    Ideal Body Weight:  91.8 kg  BMI:  Body mass index is 22.35 kg/m.  Estimated Nutritional Needs:   Kcal:  2100-2400kcal/day   Protein:  105-120gday   Fluid:  >2.1L/day   Koleen Distance MS, RD, LDN Pager #- [phone] Office#- [phone] After Hours Pager: [phone]

## 2018-12-16 NOTE — TOC Initial Note (Addendum)
Transition of Care [facility]) - Initial/Assessment Note    Patient Details  Name: William Taylor MRN: [id number] Date of Birth: 1929/10/18  [facility] CM/SW Contact:    Elza Rafter, RN Phone Number: 12/16/2018, 3:33 PM  Clinical Narrative:     Patient is from home with spouse.  Admitted with dizziness; this has been unresolved per patient since last admission.  Current with PCP.  Obtains medications at [place] without difficulty.  He continues to drive.  No need or oxygen.  Has used [facility] in the past for home health.  Patient states he is refusing home health at this time until the [place] pandemic is behind [place].  Uses a walker full time at home.  Wife will be available for transport home.   Left message for Landis Martins RN; CM for [facility] [phone] and updated her on assessment and left my call back number.  If patient is refusing HH at this time maybe [facility] can touch base with him via phone.                Expected Discharge Plan: Home/Self Care Barriers to Discharge: Continued Medical Work up   Patient Goals and CMS Choice        Expected Discharge Plan and Services Expected Discharge Plan: Home/Self Care   Discharge Planning Services: CM Consult   Living arrangements for the past 2 months: Single Family Home                                      Prior Living Arrangements/Services Living arrangements for the past 2 months: Single Family Home Lives with:: Spouse Patient language and need for interpreter reviewed:: Yes Do you feel safe going back to the place where you live?: Yes            Criminal Activity/Legal Involvement Pertinent to Current Situation/Hospitalization: No - Comment as needed  Activities of Daily Living Home Assistive Devices/Equipment: Gilford Rile (specify type) ADL Screening (condition at time of admission) Patient's cognitive ability adequate to safely complete daily activities?: Yes Is the patient deaf or have difficulty  hearing?: Yes Does the patient have difficulty seeing, even when wearing glasses/contacts?: No Does the patient have difficulty concentrating, remembering, or making decisions?: No Patient able to express need for assistance with ADLs?: Yes Does the patient have difficulty dressing or bathing?: No Independently performs ADLs?: Yes (appropriate for developmental age) Does the patient have difficulty walking or climbing stairs?: Yes Weakness of Legs: Both Weakness of Arms/Hands: Both  Permission Sought/Granted                  Emotional Assessment Appearance:: Appears stated age Attitude/Demeanor/Rapport: Gracious Affect (typically observed): Accepting Orientation: : Oriented to Self, Oriented to Place, Oriented to  Time, Oriented to Situation      Admission diagnosis:  Orthostatic hypotension [I95.1] Anemia, unspecified type [D64.9] Patient Active Problem List   Diagnosis Date Noted  . Dizziness 12/15/2018  . Gastrointestinal hemorrhage with melena   . Malnutrition of moderate degree 09/21/2018  . Orthostatic hypotension 06/06/2018  . Chronic pain of left knee 06/05/2018  . Chronic pain in testicle 06/05/2018  . Chronic back pain 02/17/2018  . Chest pain 03/07/2017  . Angiodysplasia of intestinal tract   . Iron deficiency anemia   . Benign neoplasm of ascending colon   . Diverticulosis of large intestine without diverticulitis   .  Gastric polyp   . Left rotator cuff tear arthropathy 09/11/2015  . Fatigue 04/15/2014  . Groin pain, chronic, left 01/18/2014  . Prostate cancer ([place]) 03/15/2013  . DOE (dyspnea on exertion) 11/23/2012  . Insomnia 09/03/2012  . CKD (chronic kidney disease), stage III ([place]) 07/23/2011  . Pulmonary embolism ([place]) 01/09/2011  . Paroxysmal A-fib ([place]) 09/06/2010  . Mixed hyperlipidemia 05/19/2009  . Coronary artery disease of native artery of native heart with stable angina pectoris ([place]) 05/19/2009   PCP:  [name], MD Pharmacy:    [place] [place] [address] Phone: [phone] Fax: [phone]  [place], [place] [address] [place] [address] Phone: [phone] Fax: [phone]     Social Determinants of Health (SDOH) Interventions    Readmission Risk Interventions Readmission Risk Prevention Plan 12/16/2018  Transportation Screening Complete  PCP or Specialist Appt within 3-5 Days Not Complete  Not Complete comments No appt. as of yet; will make at [place] or [place] Complete  Social Work Consult for [place] Planning/Counseling [place] Not Applicable  Medication Review (RN Care Manager) Complete  Some recent data might be hidden

## 2018-12-16 NOTE — Plan of Care (Signed)
  Problem: Clinical Measurements: Goal: Ability to maintain clinical measurements within normal limits will improve Outcome: Progressing   

## 2018-12-16 NOTE — Telephone Encounter (Signed)
Spoke with patients wife per release form and reviewed that referral has been placed and that I have reached out to [facility] for referral appointment. I will need to send them recent office visit information for their review and they will reach out to schedule appointment based on that. She was very worried about him and really wants him to be seen as soon as possible to try and help him. Advised that I would get this information to them and we would be in touch with possible appointment.

## 2018-12-16 NOTE — Telephone Encounter (Signed)
Patient wife calling   Patient currently admitted and will be dc today .  ED suggested to wife to get a referral from [facility] to [facility] for Autonomic disorder ASAP

## 2018-12-16 NOTE — Telephone Encounter (Signed)
Noted  

## 2018-12-16 NOTE — Telephone Encounter (Signed)
Called and spoke with [facility] regarding need for appointment. They requested documents to be faxed and will get that information sent to them.

## 2018-12-16 NOTE — Telephone Encounter (Signed)
Spoke with patients wife per release form and reviewed that I have faxed this information over to [facility]. She was very appreciative for the assistance and is hoping he will get to come home soon. Let her know that they should reach out to her once they review his information. She verbalized understanding of our conversation, agreement with plan, and had no further questions at this time.

## 2018-12-16 NOTE — Consult Note (Signed)
Cardiology Consultation:   Patient ID: [name]; [id number]; Feb 18, 1930   Admit date: 12/15/2018 Date of Consult: 12/16/2018  Primary Care Provider: [name], MD Primary Cardiologist: Rockey Situ   Patient Profile:   William Taylor is a 85 y.o. male with a hx of CAD status post stenting to the LAD in 2005 status post PCI to the OM 2 in 2012, autonomic dysfunction with persistent orthostatic hypotension, PAF on Xarelto, PE in 2006 with subsequent PE in 2012, cold agglutinin hemolytic anemia, prostate cancer, and hyperlipidemia who is being seen today for the evaluation of orthostatic hypotension at the request of Dr. Anselm Jungling.  History of Present Illness:   [name] previously underwent stenting to the LAD in 2005. LHC in 2009 showed a patent LAD stent without other significant disease. He was diagnosed with Afib in 04/2010 in the setting of a UTI. He has had recurrent Afib in at least 2012, 2013, and 2018. Over the years he has been anticoagulated with Xarelto and been on metoprolol as well as amiodarone with medications previously being held secondary to bradycardia. Repeat cardiac cath in 2012 showed 90% OM2 stenosis and underwent PCI/DES at [facility]. Following the cath, he had flank pain and was diagnosed with a PE with heavy clot burden. Most recent cardiac cath from 10/2013 showed mid LAD-1 lesion 10% ISR, mid LAD-2 lesion 40%, D1-1 lesion 90%, D1-2 lesion 70%, OM1 80%, OM2 stent patent without residual restenosis.Medical management was advised.Most recent ischemic evaluation from 09/2017 via nuclear stress testing showed no significant ischemia, EF 69%, low risk scan.  Most recent echo from 08/2018 showed an EF of 55 to 60%, normal wall motion, grade 1 diastolic dysfunction, trivial aortic insufficiency, left atrium normal in size, RV systolic function normal.  More recently, the patient has been diagnosed with autonomic dysfunction with persistent orthostatic hypotension  requiring usage of lower extremity compression stockings which have been in place for 15 to 20 years per patient, abdominal binder, and multiple medications including fludrocortisone, midodrine, Mestinon, and most recently droxidopa with persistent orthostasis.  Over the past 3 to 4 months the patient has had significant nausea with associated dry heaving and decreased p.o. appetite and weight loss of approximately 30 to 40 pounds.  He has been evaluated by cardiology and neurology.  MRI of the brain in late 10/2018 did not demonstrate any significant acute findings with chronic small vessel disease/atrophy noted.  The past week, the patient ran out of his droxidopa and was without this medication for 2 days.  Following refilling of his prescription he did not resume at his usual 600 mg 3 times daily and was taking 200 mg 3 times daily followed by 400 mg 3 times daily.  He indicates he was directed by his primary cardiologist to stop at 400 3 times daily.  With these changes and his droxidopa he did not note any significant worsening of his orthostasis.  He also notes he has not noted any significant improvement since starting droxidopa.  Patient was advised to go to the ED by PCP secondary to persistent orthostasis and decreased p.o. intake.  Upon his arrival to the ED he was noted to be orthostatic with blood pressure dropping from 093 systolic to 267 when going from lying to sitting and further dropping from 128-100 with standing.  This morning upon standing to use the restroom the patient became lightheaded and lethargic and was lowered to the floor with help from the nurse tech.  Patient  denies any loss of consciousness.  Telemetry was reviewed and did not show any significant arrhythmia or pauses.  Orthostatics were repeated and continued to be positive.  Patient has noted an occasional palpitation though this is not always associated with his dizziness/orthostasis.  Otherwise, he denies any significant chest  pain or shortness of breath.  He does note some intermittent right ankle swelling.  Labs this admission showed a albumin of 3.3, normal AST/ALT, T bili 2.9, serum creatinine 0.82, potassium 3.2, hemoglobin 8.9 improved to 9.6 following packed red blood cell transfusion with a baseline hemoglobin of 10-11.  Patient this morning states he continues to feel "lousy."  Upon asking him to elaborate on this his main complaint is weakness, nausea, and associated dry heaving.   Past Medical History:  Diagnosis Date   Cancer [facility])    Prostate, followed by Dr. Jacqlyn Larsen   Colon polyp    Coronary artery disease    Hyperlipidemia    Hypertension    MI (myocardial infarction) ([place])    2015   PAF (paroxysmal atrial fibrillation) ([place])    a. on xarelto   Skin cancer     Past Surgical History:  Procedure Laterality Date   CARDIAC CATHETERIZATION  10/2013   [facility]   CATARACT EXTRACTION  Oct. 3, 2012   right eye   colonoscopy     COLONOSCOPY W/ POLYPECTOMY  2015   Dr Rayann Heman   COLONOSCOPY WITH PROPOFOL N/A 08/15/2016   Procedure: COLONOSCOPY WITH PROPOFOL;  Surgeon: [name], MD;  Location: [facility] ENDOSCOPY;  Service: Endoscopy;  Laterality: N/A;   CORONARY ANGIOPLASTY  2009   2005; s/p stent   ENTEROSCOPY N/A 09/22/2018   Procedure: ENTEROSCOPY;  Surgeon: Lin Landsman, MD;  Location: [facility] ENDOSCOPY;  Service: Gastroenterology;  Laterality: N/A;   ESOPHAGOGASTRODUODENOSCOPY (EGD) WITH PROPOFOL N/A 08/15/2016   Procedure: ESOPHAGOGASTRODUODENOSCOPY (EGD) WITH PROPOFOL;  Surgeon: [name], MD;  Location: [facility] ENDOSCOPY;  Service: Endoscopy;  Laterality: N/A;   GIVENS CAPSULE STUDY N/A 09/26/2016   Procedure: GIVENS CAPSULE STUDY;  Surgeon: [name], MD;  Location: [facility] ENDOSCOPY;  Service: Endoscopy;  Laterality: N/A;   HERNIA REPAIR  2013   LUMBAR SPINE SURGERY     UPPER GI ENDOSCOPY  Sept 2015   Dr Rayann Heman     Home Meds: Prior to Admission medications   Medication Sig Start  Date End Date Taking? Authorizing Provider  amiodarone (PACERONE) 200 MG tablet Take 1 tablet (200 mg total) by mouth daily. 09/24/18  Yes Salary, Avel Peace, MD  Droxidopa (NORTHERA) 100 MG CAPS Take 600 mg by mouth 3 (three) times daily. Patient taking differently: Take 400 mg by mouth 3 (three) times daily.  10/13/18  Yes [name], MD  feeding supplement, ENSURE ENLIVE, (ENSURE ENLIVE) LIQD Take 237 mLs by mouth daily. 09/23/18  Yes Salary, Avel Peace, MD  fenofibrate micronized (ANTARA) 130 MG capsule TAKE 1 CAPSULE BY MOUTH  DAILY BEFORE BREAKFAST 08/24/18  Yes [name], MD  finasteride (PROSCAR) 5 MG tablet Take 1 tablet (5 mg total) by mouth daily. 10/16/18  Yes Stoioff, Ronda Fairly, MD  fludrocortisone (FLORINEF) 0.1 MG tablet Take 1 tablet (0.1 mg total) by mouth daily. 10/14/18  Yes Stoioff, Ronda Fairly, MD  levobunolol (BETAGAN) 0.5 % ophthalmic solution Place 1 drop into the right eye 2 (two) times daily.    Yes [provider]  levothyroxine (SYNTHROID, LEVOTHROID) 50 MCG tablet Take 1 tablet (50 mcg total) by mouth daily at 6 (  six) AM. 09/24/18  Yes Salary, Avel Peace, MD  midodrine (PROAMATINE) 10 MG tablet Take 1 tablet (10 mg total) by mouth 3 (three) times daily. 11/18/18  Yes [name], MD  midodrine (PROAMATINE) 5 MG tablet Take 1 tablet (5 mg) three times a day as needed 11/18/18  Yes Gollan, Kathlene November, MD  nitroGLYCERIN (NITROSTAT) 0.4 MG SL tablet DISSOLVE UNDER THE TONGUE 1 TABLET EVERY 5 MINUTES AS NEEDED FOR CHEST PAIN 06/11/16  Yes Gollan, Kathlene November, MD  ondansetron (ZOFRAN-ODT) 8 MG disintegrating tablet Take 0.5 tablets (4 mg total) by mouth every 8 (eight) hours as needed for nausea. 09/16/18  Yes Guse, Jacquelynn Cree, FNP  pantoprazole (PROTONIX) 40 MG tablet Take 1 tablet (40 mg total) by mouth 2 (two) times daily. 11/16/18 02/14/19 Yes [name], MD  potassium chloride (K-DUR) 10 MEQ tablet Take 1 tablet (10 mEq total) by mouth daily. 11/02/18 01/31/19 Yes Gollan,  Kathlene November, MD  pravastatin (PRAVACHOL) 40 MG tablet TAKE 1 TABLET BY MOUTH AT  BEDTIME 06/08/18  Yes [name], MD  pyridostigmine (MESTINON) 180 MG CR tablet Take 1 tablet (180 mg total) by mouth daily. 12/14/18  Yes Gollan, Kathlene November, MD  QC NATURA-LAX powder MIX AND TAKE 17 GM (LINE ON INSIDE OF LID) TWICE DAILY AS NEEDED 12/04/18  Yes [name], MD  ranolazine (RANEXA) 500 MG 12 hr tablet Take 1 tablet (500 mg total) by mouth 2 (two) times daily. 11/26/18  Yes Gollan, Kathlene November, MD  tamsulosin (FLOMAX) 0.4 MG CAPS capsule Take 0.4 mg by mouth as needed.   Yes [provider]  Vitamin D, Ergocalciferol, (DRISDOL) 1.25 MG (50000 UT) CAPS capsule Take 50,000 Units by mouth every 7 (seven) days.  10/30/18 12/19/18 Yes [provider]  XARELTO 20 MG TABS tablet TAKE 1 TABLET BY MOUTH  DAILY 11/06/18  Yes Gollan, Kathlene November, MD    Inpatient Medications: Scheduled Meds:  amiodarone  200 mg Oral Daily   Droxidopa  400 mg Oral TID   fenofibrate  54 mg Oral Daily   finasteride  5 mg Oral Daily   fludrocortisone  0.1 mg Oral Daily   levobunolol  1 drop Right Eye BID   levothyroxine  50 mcg Oral Q0600   midodrine  10 mg Oral TID   pantoprazole  40 mg Oral BID   pravastatin  40 mg Oral QHS   pyridostigmine  60 mg Oral Q8H   ranolazine  500 mg Oral BID   rivaroxaban  20 mg Oral Daily   sodium chloride flush  3 mL Intravenous Q12H   tamsulosin  0.4 mg Oral QPC supper   Continuous Infusions:  sodium chloride     sodium chloride     sodium chloride 75 mL/hr at 12/16/18 0928   PRN Meds: sodium chloride, acetaminophen **OR** acetaminophen, hydrALAZINE, nitroGLYCERIN, ondansetron **OR** ondansetron (ZOFRAN) IV, sodium chloride flush  Allergies:   Allergies  Allergen Reactions   Bee Venom Swelling   Sulfasalazine Hives and Swelling   Sulfa Antibiotics Hives and Swelling   Warfarin And Related Other (See Comments)    Chest pain    Social  History:   Social History   Socioeconomic History   Marital status: Married    Spouse name: Not on file   Number of children: Not on file   Years of education: Not on file   Highest education level: Not on file  Occupational History   Not on file  Social  Needs   Financial resource strain: Not on file   Food insecurity:    Worry: Not on file    Inability: Not on file   Transportation needs:    Medical: Not on file    Non-medical: Not on file  Tobacco Use   Smoking status: Never Smoker   Smokeless tobacco: Never Used  Substance and Sexual Activity   Alcohol use: No   Drug use: No   Sexual activity: Not Currently  Lifestyle   Physical activity:    Days per week: Not on file    Minutes per session: Not on file   Stress: Not on file  Relationships   Social connections:    Talks on phone: Not on file    Gets together: Not on file    Attends religious service: Not on file    Active member of club or organization: Not on file    Attends meetings of clubs or organizations: Not on file    Relationship status: Not on file   Intimate partner violence:    Fear of current or ex partner: Not on file    Emotionally abused: Not on file    Physically abused: Not on file    Forced sexual activity: Not on file  Other Topics Concern   Not on file  Social History Narrative   Not on file     Family History:   Family History  Problem Relation Age of Onset   Stroke Mother    Colon cancer Father    Coronary artery disease Brother    Other Brother 28       lymes dz    ROS:  Review of Systems  Constitutional: Positive for malaise/fatigue and weight loss. Negative for chills, diaphoresis and fever.       Loss of appetite   HENT: Negative for congestion.   Eyes: Negative for discharge and redness.  Respiratory: Negative for cough, hemoptysis, sputum production, shortness of breath and wheezing.   Cardiovascular: Negative for chest pain, palpitations,  orthopnea, claudication, leg swelling and PND.  Gastrointestinal: Positive for constipation and nausea. Negative for abdominal pain, blood in stool, diarrhea, heartburn, melena and vomiting.  Genitourinary: Negative for hematuria.  Musculoskeletal: Negative for falls and myalgias.  Skin: Negative for rash.  Neurological: Positive for dizziness and weakness. Negative for tingling, tremors, sensory change, speech change, focal weakness and loss of consciousness.  Endo/Heme/Allergies: Does not bruise/bleed easily.  Psychiatric/Behavioral: Negative for substance abuse. The patient is not nervous/anxious.   All other systems reviewed and are negative.     Physical Exam/Data:   Vitals:   12/16/18 0517 12/16/18 0749 12/16/18 0856 12/16/18 0909  BP: 128/67 (!) 143/98 (!) 150/89 104/71  Pulse: 75 74 74 79  Resp: 18     Temp: 98.1 F (36.7 C) 98.2 F (36.8 C)    TempSrc: Oral Oral    SpO2: 97% 98% 98% 100%  Weight:      Height:        Intake/Output Summary (Last 24 hours) at 12/16/2018 0931 Last data filed at 12/15/2018 2204 Gross per 24 hour  Intake 1380 ml  Output 260 ml  Net 1120 ml   Filed Weights   12/15/18 1005 12/15/18 1659  Weight: 81.6 kg 83.3 kg   Body mass index is 22.35 kg/m.   Physical Exam: General: Elderly and frail-appearing, in no acute distress. Head: Normocephalic, atraumatic, sclera non-icteric, no xanthomas, nares without discharge.  Neck: Negative for carotid bruits. JVD not  elevated. Lungs: Clear bilaterally to auscultation without wheezes, rales, or rhonchi. Breathing is unlabored. Heart: RRR with S1 S2. No murmurs, rubs, or gallops appreciated. Abdomen: Soft, non-tender, non-distended with normoactive bowel sounds. No hepatomegaly. No rebound/guarding. No obvious abdominal masses. Msk:  Strength and tone appear normal for age. Extremities: No clubbing or cyanosis. No edema. Distal pedal pulses are 2+ and equal bilaterally. Neuro: Alert and oriented X 3.  No facial asymmetry. No focal deficit. Moves all extremities spontaneously. Psych:  Responds to questions appropriately with a normal affect.   EKG:  The EKG was personally reviewed and demonstrates: NSR, 79 bpm, left axis deviation, left anterior fascicular block, baseline wandering, nonspecific ST-T changes Telemetry:  Telemetry was personally reviewed and demonstrates: Sinus rhythm  Weights: Filed Weights   12/15/18 1005 12/15/18 1659  Weight: 81.6 kg 83.3 kg    Relevant CV Studies: 2D Echo 08/2018: - Left ventricle: The cavity size was normal. There was mild   concentric hypertrophy. Systolic function was normal. The   estimated ejection fraction was in the range of 55% to 60%. Wall   motion was normal; there were no regional wall motion   abnormalities. Doppler parameters are consistent with abnormal   left ventricular relaxation (grade 1 diastolic dysfunction). - Aortic valve: There was trivial regurgitation. - Left atrium: The atrium was normal in size. - Right ventricle: Systolic function was normal. - Pulmonary arteries: Systolic pressure could not be accurately   estimated. __________  Myoview 09/2017: Pharmacological myocardial perfusion imaging study with no significant ischemia Normal wall motion, EF estimated at 69% No EKG changes concerning for ischemia at peak stress or in recovery. Low risk scan  Laboratory Data:  Chemistry Recent Labs  Lab 12/15/18 1022 12/16/18 0505  NA 134* 137  K 3.4* 3.2*  CL 99 105  CO2 25 23  GLUCOSE 144* 116*  BUN 15 15  CREATININE 1.17 0.82  CALCIUM 9.2 8.7*  GFRNONAA 56* >60  GFRAA >60 >60  ANIONGAP 10 9    Recent Labs  Lab 12/15/18 1022  PROT 6.4*  ALBUMIN 3.8  AST 33  ALT 12  ALKPHOS 32*  BILITOT 2.2*   Hematology Recent Labs  Lab 12/15/18 1108 12/16/18 0505  WBC 6.5 8.4  RBC 2.35*   2.44* 2.75*  HGB 8.9* 9.6*  HCT 25.2* 28.6*  MCV 107.2* 104.0*  MCH 37.9* 34.9*  MCHC 35.3 33.6  RDW 13.6 15.0  PLT  310 246   Cardiac Enzymes Recent Labs  Lab 12/15/18 1022  TROPONINI <0.03   No results for input(s): TROPIPOC in the last 168 hours.  BNPNo results for input(s): BNP, PROBNP in the last 168 hours.  DDimer No results for input(s): DDIMER in the last 168 hours.  Radiology/Studies:  No results found.  Assessment and Plan:   1.  Autonomic dysfunction with persistent orthostatic hypotension: -Likely exacerbated by the patient's weight loss and decreased p.o. consumption -Continue to hydrate via IV fluids -Patient was recently out of Northera for 2 days and upon resumption started taking 200 3 times daily followed by 400 3 times daily rather than his usual 600 3 times daily.  In this setting, he did not notice any significant change in his symptoms -Patient continues to be on fludrocortisone, midodrine, Northera, and Mestinon -Continue thigh-high lower extremity compression hose and abdominal binder -Patient's family is requesting referral to autonomic disorder clinic through [facility] or [place] -The patient's primary cardiologist is working on this referral -Repeat orthostatic vital signs later  today or on 4/30 -It may be best for the patient to not depend on his walker quite as much and be wheelchair-bound in an effort to minimize his orthostasis  2.  Loss of appetite/weight loss/nausea: -Over the past couple of months the patient's weight is down almost 40 pounds.  He indicates he is not eating or drinking well secondary to dry heaving with associated nausea.  Unable to exclude possible vagal episodes contributing to his known near syncope with persistent orthostatic hypotension. -Review of medications indicates polypharmacy with adverse effects likely contributing to his symptoms -It appears the patient's main complaint is nausea, dry heaving, and weakness -Chart biopsy of patient's medication indicates he is taking as using levobunolol, tamsulosin, and finasteride which can all  contribute to BP drops.  Further management of these medications are deferred to internal medicine.  Cannot exclude medication component to his nausea as he is on several medications that may lead to nausea including pravastatin, amiodarone, fenofibrate, levothyroxine, droxidopa, ranolazine, fludrocortisone, Mestinon, pantoprazole, and potassium chloride -With the above medication list, it is unlikely he would be able to come off of several of these medications given his comorbid conditions and ongoing symptoms though consideration should be undertaken to minimize some of his polypharmacy with discontinuation of his possibly amiodarone, fenofibrate, pantoprazole, and potassium chloride.  Will discuss with MD  3.  CAD involving the native coronary arteries without angina: -No symptoms concerning for ischemia -On Xarelto in place of aspirin -Continue ranolazine -No plans for ischemic evaluation at this time  4.  PAF: -Maintaining sinus rhythm -Not currently on beta-blocker because of gentle blocker given persistent orthostatic hypotension -Remains on amiodarone with normal AST/ALT and TSH this admission -Continue Xarelto  5.  History of PE: -Remains on Xarelto  6.  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   Signed, Christell Faith, PA-C [place] Pager: [phone] 12/16/2018, 9:31 AM

## 2018-12-16 NOTE — Telephone Encounter (Signed)
Message fwd to Dr.Gollan

## 2018-12-16 NOTE — Progress Notes (Signed)
[facility] [place] NAME: William Taylor    MR#:  [id number]  DATE OF BIRTH:  [birth year]  SUBJECTIVE:  CHIEF COMPLAINT:   Chief Complaint  Patient presents with  . Hypotension    orthostatic   Patient with recurrent and persistent orthostatic hypotension problem for last few months he also had losing weight and appetite.  Today morning he had episode of short.  Syncopal secondary to standing up and blood pressure dropped. Was fully alert and oriented when I saw.  REVIEW OF SYSTEMS:  CONSTITUTIONAL: No fever, fatigue or weakness.  EYES: No blurred or double vision.  EARS, NOSE, AND THROAT: No tinnitus or ear pain.  RESPIRATORY: No cough, shortness of breath, wheezing or hemoptysis.  CARDIOVASCULAR: No chest pain, orthopnea, edema.  GASTROINTESTINAL: No nausea, vomiting, diarrhea or abdominal pain.  GENITOURINARY: No dysuria, hematuria.  ENDOCRINE: No polyuria, nocturia,  HEMATOLOGY: No anemia, easy bruising or bleeding SKIN: No rash or lesion. MUSCULOSKELETAL: No joint pain or arthritis.   NEUROLOGIC: No tingling, numbness, weakness.  PSYCHIATRY: No anxiety or depression.   ROS  DRUG ALLERGIES:   Allergies  Allergen Reactions  . Bee Venom Swelling  . Sulfasalazine Hives and Swelling  . Sulfa Antibiotics Hives and Swelling  . Warfarin And Related Other (See Comments)    Chest pain    VITALS:  Blood pressure 104/71, pulse 79, temperature 98.2 F (36.8 C), temperature source Oral, resp. rate 18, height 6\' 4"  (1.93 m), weight 83.3 kg, SpO2 100 %.  PHYSICAL EXAMINATION:  GENERAL:  85 y.o.-year-old patient lying in the bed with no acute distress.  EYES: Pupils equal, round, reactive to light and accommodation. No scleral icterus. Extraocular muscles intact.  HEENT: Head atraumatic, normocephalic. Oropharynx and nasopharynx clear.  NECK:  Supple, no jugular venous distention. No thyroid enlargement, no tenderness.  LUNGS: Normal  breath sounds bilaterally, no wheezing, rales,rhonchi or crepitation. No use of accessory muscles of respiration.  CARDIOVASCULAR: S1, S2 normal. No murmurs, rubs, or gallops.  ABDOMEN: Soft, nontender, nondistended. Bowel sounds present. No organomegaly or mass.  EXTREMITIES: No pedal edema, cyanosis, or clubbing.  NEUROLOGIC: Cranial nerves II through XII are intact. Muscle strength 5/5 in all extremities. Sensation intact. Gait not checked.  PSYCHIATRIC: The patient is alert and oriented x 3.  SKIN: No obvious rash, lesion, or ulcer.   Physical Exam LABORATORY PANEL:   CBC Recent Labs  Lab 12/16/18 0505  WBC 8.4  HGB 9.6*  HCT 28.6*  PLT 246   ------------------------------------------------------------------------------------------------------------------  Chemistries  Recent Labs  Lab 12/16/18 0505  NA 137  K 3.2*  CL 105  CO2 23  GLUCOSE 116*  BUN 15  CREATININE 0.82  CALCIUM 8.7*  AST 28  ALT 11  ALKPHOS 30*  BILITOT 2.9*   ------------------------------------------------------------------------------------------------------------------  Cardiac Enzymes Recent Labs  Lab 12/15/18 1022  TROPONINI <0.03   ------------------------------------------------------------------------------------------------------------------  RADIOLOGY:  No results found.  ASSESSMENT AND PLAN:   Active Problems:   Dizziness  Patient is 84 year old with history of prostate cancer and coronary artery disease and hyperlipidemia presenting with generalized weakness and orthostatic hypotension  1.  Orthostatic hypotension patient already on midodrine, florinef Follows with cardiologist as outpatient, I have called the consult. Cardiologist have suggested to decrease amiodarone and stopping his tamsulosin and fenofibrate and monitor for urinary retention.  Also advised to increase Florinef 0.2 mg daily. Advised to continue current dose of midodrine, pyridostigmine,  droxidopa. Cardiology suggested to check for ACTH  and cosyntropin stimulation test  2.  Atrial fibrillation continue amiodarone and Xarelto  3.  Worsening anemia the ED physician discussed the case with patient's primary oncologist who recommended patient get transfused 1 unit of packed RBCs which the ED physician has already ordered Patient have cold agglutinin antibodies.  Was given 1 unit of blood transfusion. Extensive work-up was done by GI in recent past so does not need further GI work-up.  4.  BPH continue Proscar  5.  GERD continue Protonix  6.  Hyperlipidemia continue Pravachol    All the records are reviewed and case discussed with Care Management/Social Workerr. Management plans discussed with the patient, family and they are in agreement.  CODE STATUS: Full.  TOTAL TIME TAKING CARE OF THIS PATIENT: 35 minutes.     POSSIBLE D/C IN 1-2 DAYS, DEPENDING ON CLINICAL CONDITION.   Vaughan Basta M.D on 12/16/2018   Between 7am to 6pm - Pager - [phone]  After 6pm go to [email or web address] - password [email or web address] [facility]  Office  [phone]  CC: Primary care physician; [name], MD  Note: This dictation was prepared with Dragon dictation along with smaller phrase technology. Any transcriptional errors that result from this process are unintentional.

## 2018-12-16 NOTE — Progress Notes (Signed)
Patient was getting up to use the bathroom with the tech. This patient became limp and lethargic, then lowered to floor by the tech and helper. This RN was called into room and patient was oriented but very weak. We then picked the patient up to the bed. No pain reported by patient. Patient was assessed for any visible marks. Vitals were stable. MD called and new orders were placed. Orthostatic vitals taken and are in. Patient unable to stand due to severe Orthostatic vital signs. See in chart.  Will continue to monitor this patient closely.

## 2018-12-16 NOTE — Consult Note (Signed)
Reason for Consult: falls/unsteady gait Referring Physician: Dr. Anselm Jungling  CC: unsteady gait/inbalance/falls.   HPI: William Taylor is an 85 y.o. male history of prostate cancer, coronary artery disease, hyperlipidemia, hypertension, coronary artery disease, paroxysmal atrial fibrillation who is presenting to the emergency room with complaint of dizziness and orthostatic hypotension.  Patient states that his been feeling weak for for long period of time and with progressively gotten worse.This morning, patient was standing up with assistance from the nursing staff and briefly passed out with associated drop in blood pressure. Pt is already on midodrine and florinef.    Past Medical History:  Diagnosis Date  . Cancer [facility])    Prostate, followed by Dr. Jacqlyn Larsen  . Colon polyp   . Coronary artery disease   . Hyperlipidemia   . Hypertension   . MI (myocardial infarction) ([place])    2015  . PAF (paroxysmal atrial fibrillation) ([place])    a. on xarelto  . Skin cancer     Past Surgical History:  Procedure Laterality Date  . CARDIAC CATHETERIZATION  10/2013   [facility]  . CATARACT EXTRACTION  Oct. 3, 2012   right eye  . colonoscopy    . COLONOSCOPY W/ POLYPECTOMY  2015   Dr Rayann Heman  . COLONOSCOPY WITH PROPOFOL N/A 08/15/2016   Procedure: COLONOSCOPY WITH PROPOFOL;  Surgeon: [name], MD;  Location: [facility] ENDOSCOPY;  Service: Endoscopy;  Laterality: N/A;  . CORONARY ANGIOPLASTY  2009   2005; s/p stent  . ENTEROSCOPY N/A 09/22/2018   Procedure: ENTEROSCOPY;  Surgeon: Lin Landsman, MD;  Location: [facility];  Service: Gastroenterology;  Laterality: N/A;  . ESOPHAGOGASTRODUODENOSCOPY (EGD) WITH PROPOFOL N/A 08/15/2016   Procedure: ESOPHAGOGASTRODUODENOSCOPY (EGD) WITH PROPOFOL;  Surgeon: [name], MD;  Location: [facility] ENDOSCOPY;  Service: Endoscopy;  Laterality: N/A;  . GIVENS CAPSULE STUDY N/A 09/26/2016   Procedure: GIVENS CAPSULE STUDY;  Surgeon: [name], MD;  Location: [facility] ENDOSCOPY;   Service: Endoscopy;  Laterality: N/A;  . HERNIA REPAIR  2013  . LUMBAR SPINE SURGERY    . UPPER GI ENDOSCOPY  Sept 2015   Dr Rayann Heman    Family History  Problem Relation Age of Onset  . Stroke Mother   . Colon cancer Father   . Coronary artery disease Brother   . Other Brother 73       lymes dz    Social History:  reports that he has never smoked. He has never used smokeless tobacco. He reports that he does not drink alcohol or use drugs.  Allergies  Allergen Reactions  . Bee Venom Swelling  . Sulfasalazine Hives and Swelling  . Sulfa Antibiotics Hives and Swelling  . Warfarin And Related Other (See Comments)    Chest pain    Medications: I have reviewed the patient's current medications.  ROS: History obtained from the patient  General ROS: negative for - chills, fatigue, fever, night sweats, weight gain or weight loss Psychological ROS: negative for - behavioral disorder, hallucinations, memory difficulties, mood swings or suicidal ideation Ophthalmic ROS: negative for - blurry vision, double vision, eye pain or loss of vision ENT ROS: negative for - epistaxis, nasal discharge, oral lesions, sore throat, tinnitus or vertigo Allergy and Immunology ROS: negative for - hives or itchy/watery eyes Hematological and Lymphatic ROS: negative for - bleeding problems, bruising or swollen lymph nodes Endocrine ROS: negative for - galactorrhea, hair pattern changes, polydipsia/polyuria or temperature intolerance Respiratory ROS: negative for - cough, hemoptysis, shortness of breath or wheezing Cardiovascular  ROS: negative for - chest pain, dyspnea on exertion, edema or irregular heartbeat Gastrointestinal ROS: negative for - abdominal pain, diarrhea, hematemesis, nausea/vomiting or stool incontinence Genito-Urinary ROS: negative for - dysuria, hematuria, incontinence or urinary frequency/urgency Musculoskeletal ROS: negative for - joint swelling or muscular weakness Neurological ROS: as  noted in HPI Dermatological ROS: negative for rash and skin lesion changes  Physical Examination: Blood pressure 104/71, pulse 79, temperature 98.2 F (36.8 C), temperature source Oral, resp. rate 18, height 6\' 4"  (1.93 m), weight 83.3 kg, SpO2 100 %.    Neurological Examination   Mental Status: Alert, oriented, thought content appropriate.  Speech fluent without evidence of aphasia.  Able to follow 3 step commands without difficulty. Cranial Nerves: II: Discs flat bilaterally; Visual fields grossly normal, pupils equal, round, reactive to light and accommodation III,IV, VI: ptosis not present, extra-ocular motions intact bilaterally V,VII: smile symmetric, facial light touch sensation normal bilaterally VIII: hearing normal bilaterally IX,X: gag reflex present XI: bilateral shoulder shrug XII: midline tongue extension Motor: Generalized weakness.  Tone and bulk:normal tone throughout; no atrophy noted Sensory: Pinprick and light touch intact throughout, bilaterally Deep Tendon Reflexes: 1+ and symmetric throughout Plantars: Right: downgoing   Left: downgoing Cerebellar: normal finger-to-nose, normal rapid alternating movements and normal heel-to-shin test Gait: not tested      Laboratory Studies:   Basic Metabolic Panel: Recent Labs  Lab 12/15/18 1022 12/16/18 0505  NA 134* 137  K 3.4* 3.2*  CL 99 105  CO2 25 23  GLUCOSE 144* 116*  BUN 15 15  CREATININE 1.17 0.82  CALCIUM 9.2 8.7*    Liver Function Tests: Recent Labs  Lab 12/15/18 1022 12/16/18 0505  AST 33 28  ALT 12 11  ALKPHOS 32* 30*  BILITOT 2.2* 2.9*  PROT 6.4* 5.7*  ALBUMIN 3.8 3.3*   No results for input(s): LIPASE, AMYLASE in the last 168 hours. No results for input(s): AMMONIA in the last 168 hours.  CBC: Recent Labs  Lab 12/15/18 1108 12/16/18 0505  WBC 6.5 8.4  NEUTROABS 4.8  --   HGB 8.9* 9.6*  HCT 25.2* 28.6*  MCV 107.2* 104.0*  PLT 310 246    Cardiac Enzymes: Recent Labs   Lab 12/15/18 1022  TROPONINI <0.03    BNP: Invalid input(s): POCBNP  CBG: No results for input(s): GLUCAP in the last 168 hours.  Microbiology: Results for orders placed or performed in visit on 07/05/16  Fecal occult blood, imunochemical     Status: Abnormal   Collection Time: 07/05/16  4:40 PM  Result Value Ref Range Status   Fecal Occult Bld Positive (A) Negative Final    Coagulation Studies: No results for input(s): LABPROT, INR in the last 72 hours.  Urinalysis:  Recent Labs  Lab 12/15/18 1506  COLORURINE YELLOW*  LABSPEC 1.010  PHURINE 7.0  GLUCOSEU NEGATIVE  HGBUR NEGATIVE  BILIRUBINUR NEGATIVE  KETONESUR NEGATIVE  PROTEINUR NEGATIVE  NITRITE NEGATIVE  LEUKOCYTESUR NEGATIVE    Lipid Panel:     Component Value Date/Time   CHOL 110 06/28/2016 1106   CHOL 119 10/27/2013 0609   TRIG 80.0 06/28/2016 1106   TRIG 61 10/27/2013 0609   HDL 29.90 (L) 06/28/2016 1106   HDL 38 (L) 10/27/2013 0609   CHOLHDL 4 06/28/2016 1106   VLDL 16.0 06/28/2016 1106   VLDL 12 10/27/2013 0609   LDLCALC 64 06/28/2016 1106   LDLCALC 69 10/27/2013 0609    HgbA1C:  Lab Results  Component Value Date  HGBA1C 5.5 09/16/2018    Urine Drug Screen:  No results found for: LABOPIA, COCAINSCRNUR, LABBENZ, AMPHETMU, THCU, LABBARB  Alcohol Level: No results for input(s): ETH in the last 168 hours.  Other results: EKG: normal EKG, normal sinus rhythm, unchanged from previous tracings.  Imaging: No results found.   Assessment/Plan:  85 y.o. male history of prostate cancer, coronary artery disease, hyperlipidemia, hypertension, coronary artery disease, paroxysmal atrial fibrillation who is presenting to the emergency room with complaint of dizziness and orthostatic hypotension.  Patient states that his been feeling weak for for long period of time and with progressively gotten worse.This morning, patient was standing up with assistance from the nursing staff and briefly passed out  with associated drop in blood pressure. Pt is already on midodrine and florinef.    - I am not convinced this is central dysautonomia/neurological. Those patients usually movement disorder of some type including Parkinson's, Parkinson's Plus such as Multi systems atrophy, Progressive supranuclear palsy which he does not have.  -  Recent MRI on 3/30 without any brain stem atrophy.  - No clearly convinced this is neurological.  I agree with further [facility] follow up. He already sees Dr. Manuella Ghazi as out pt Neurologist who has done movement disorder testing on him and we both agree that this is unlikely a movement disorder abnormality .  12/16/2018, 11:34 AM

## 2018-12-16 NOTE — Care Management Obs Status (Signed)
MEDICARE OBSERVATION STATUS NOTIFICATION   Patient Details  Name: William Taylor MRN: [id number] Date of Birth: January 18, 1930   Medicare Observation Status Notification Given:  Yes    Elza Rafter, RN 12/16/2018, 4:45 PM

## 2018-12-17 DIAGNOSIS — I4581 Long QT syndrome: Secondary | ICD-10-CM | POA: Diagnosis not present

## 2018-12-17 DIAGNOSIS — N400 Enlarged prostate without lower urinary tract symptoms: Secondary | ICD-10-CM | POA: Diagnosis not present

## 2018-12-17 DIAGNOSIS — I4891 Unspecified atrial fibrillation: Secondary | ICD-10-CM | POA: Diagnosis not present

## 2018-12-17 DIAGNOSIS — I951 Orthostatic hypotension: Secondary | ICD-10-CM | POA: Diagnosis not present

## 2018-12-17 DIAGNOSIS — R1030 Lower abdominal pain, unspecified: Secondary | ICD-10-CM | POA: Diagnosis not present

## 2018-12-17 DIAGNOSIS — D649 Anemia, unspecified: Secondary | ICD-10-CM | POA: Diagnosis not present

## 2018-12-17 LAB — ACTH STIMULATION, 3 TIME POINTS
Cortisol, 30 Min: 24.5 ug/dL
Cortisol, 60 Min: 26.5 ug/dL
Cortisol, Base: 10.4 ug/dL

## 2018-12-17 LAB — TYPE AND SCREEN
ABO/RH(D): A POS
Antibody Screen: NEGATIVE
Unit division: 0

## 2018-12-17 LAB — POTASSIUM: Potassium: 3.7 mmol/L (ref 3.5–5.1)

## 2018-12-17 LAB — IRON AND TIBC
Iron: 240 ug/dL — ABNORMAL HIGH (ref 45–182)
Saturation Ratios: 81 % — ABNORMAL HIGH (ref 17.9–39.5)
TIBC: 297 ug/dL (ref 250–450)
UIBC: 57 ug/dL

## 2018-12-17 LAB — FERRITIN: Ferritin: 209 ng/mL (ref 24–336)

## 2018-12-17 LAB — BPAM RBC
Blood Product Expiration Date: 202005042359
ISSUE DATE / TIME: 202004281843
Unit Type and Rh: 6200

## 2018-12-17 LAB — CORTISOL-AM, BLOOD: Cortisol - AM: 11.1 ug/dL (ref 6.7–22.6)

## 2018-12-17 MED ORDER — HYOSCYAMINE SULFATE 0.125 MG PO TBDP
0.1250 mg | ORAL_TABLET | Freq: Four times a day (QID) | ORAL | Status: DC | PRN
  Filled 2018-12-17: qty 1

## 2018-12-17 MED ORDER — POTASSIUM CHLORIDE CRYS ER 20 MEQ PO TBCR
40.0000 meq | EXTENDED_RELEASE_TABLET | Freq: Two times a day (BID) | ORAL | Status: AC
  Administered 2018-12-17 (×2): 40 meq via ORAL
  Filled 2018-12-17 (×2): qty 2

## 2018-12-17 MED ORDER — HYDRALAZINE HCL 20 MG/ML IJ SOLN
5.0000 mg | INTRAMUSCULAR | Status: DC | PRN
  Administered 2018-12-18 – 2018-12-19 (×2): 5 mg via INTRAVENOUS
  Filled 2018-12-17 (×2): qty 1

## 2018-12-17 NOTE — Consult Note (Signed)
Cephas Darby, MD [address]  [place]  [place], [place] [address]  Main: [phone]  Fax: [phone] Pager: [phone]   Consultation  Referring Provider:     No ref. provider found Primary Care Physician:  [name], MD Primary Gastroenterologist:  Dr. [name]         Reason for Consultation:     Lower abdominal pain, elevated bilirubin  Date of Admission:  12/15/2018 Date of Consultation:  12/17/2018         HPI:   William Taylor is a 85 y.o. male with history of prostate cancer, coronary disease, hyperlipidemia, hypertension, paroxysmal A. fib who is admitted with dizziness and orthostatic hypotension.  Patient has been seen by Dr. [name] who has performed extensive work-up for weight loss, he also has history of cold agglutinin hemolytic anemia.  He underwent EGD, colonoscopy and video capsule endoscopy which were overall unremarkable.  Patient had significant unexplained weight loss in last few months.  Dr. Anselm Jungling consulted GI to evaluate for lower abdominal pain as well as elevated bilirubin.  Patient's total bilirubin is up to 2.9 today however it is predominantly indirect bilirubin and rest of the LFTs are unremarkable.  Patient is complaining of lower abdominal pain, 5-6/10 intensity, describes it as stable pain.  He also reports early satiety despite feeling hungry.  He states after 1 or 2 bites he does not feel like eating.  I asked him if he is depressed and he denied.  He denies upper abdominal discomfort.  He does report intermittent nausea.  He denies diarrhea or constipation.  He denies fever, chills.  He is on midodrine, Florinef for orthostatic hypotension.  Cardiology recommended to decrease the dose on amiodarone, stop tamsulosin and fenofibrate   NSAIDs: None  Antiplts/Anticoagulants/Anti thrombotics: Xarelto for A. fib  GI Procedures: EGD, colonoscopy and video capsule endoscopy within last 5 years Colonoscopy 10/26/2013 Diagnosis:   Part A: ILEOCECAL VALVE COLD BIOPSY:  - NO PATHOLOGIC CHANGES.  .  Part B: ASCENDING COLON POLYP X2 HOT SNARE:  - TUBULAR ADENOMA (1), NEGATIVE FOR HIGH GRADE DYSPLASIA AND  MALIGNANCY.  - BENIGN INFLAMMATORY POLYP (1), NEGATIVE FOR DYSPLASIA AND  MALIGNANCY.  .  Part C: TRANSVERSE COLON POLYP HOT SNARE:  - TUBULAR ADENOMA.  - NEGATIVE FOR HIGH GRADE DYSPLASIA AND MALIGNANCY.   EGD and colonoscopy 07/2016 DIAGNOSIS:  A. RANDOM DUODENUM; COLD BIOPSY:  - FRAGMENTED DUODENAL MUCOSA WITH PROMINENT BRUNNER'S GLAND HYPERPLASIA.  - NEGATIVE FOR INTRAEPITHELIAL LYMPHOCYTOSIS, DYSPLASIA AND MALIGNANCY.   B. GASTRIC POLYP; HOT SNARE:  - HYPERPLASTIC GASTRIC TYPE POLYP, INFLAMED.  - NEGATIVE FOR DYSPLASIA AND MALIGNANCY.   C. COLON POLYP 2, ASCENDING; COLD BIOPSY:  - TUBULAR ADENOMA (1), NEGATIVE FOR HIGH-GRADE DYSPLASIA AND MALIGNANCY.   - HYPERPLASTIC POLYP (1), NEGATIVE FOR DYSPLASIA AND MALIGNANCY.   Past Medical History:  Diagnosis Date  . Cancer [facility])    Prostate, followed by Dr. Jacqlyn Larsen  . Colon polyp   . Coronary artery disease   . Hyperlipidemia   . Hypertension   . MI (myocardial infarction) ([place])    2015  . PAF (paroxysmal atrial fibrillation) ([place])    a. on xarelto  . Skin cancer     Past Surgical History:  Procedure Laterality Date  . CARDIAC CATHETERIZATION  10/2013   [facility]  . CATARACT EXTRACTION  Oct. 3, 2012   right eye  . colonoscopy    . COLONOSCOPY W/ POLYPECTOMY  2015   Dr  Rein  . COLONOSCOPY WITH PROPOFOL N/A 08/15/2016   Procedure: COLONOSCOPY WITH PROPOFOL;  Surgeon: [name], MD;  Location: [facility] ENDOSCOPY;  Service: Endoscopy;  Laterality: N/A;  . CORONARY ANGIOPLASTY  2009   2005; s/p stent  . ENTEROSCOPY N/A 09/22/2018   Procedure: ENTEROSCOPY;  Surgeon: Lin Landsman, MD;  Location: [place];  Service: Gastroenterology;  Laterality: N/A;  . ESOPHAGOGASTRODUODENOSCOPY (EGD) WITH PROPOFOL N/A 08/15/2016   Procedure:  ESOPHAGOGASTRODUODENOSCOPY (EGD) WITH PROPOFOL;  Surgeon: [name], MD;  Location: [facility] ENDOSCOPY;  Service: Endoscopy;  Laterality: N/A;  . GIVENS CAPSULE STUDY N/A 09/26/2016   Procedure: GIVENS CAPSULE STUDY;  Surgeon: [name], MD;  Location: [facility] ENDOSCOPY;  Service: Endoscopy;  Laterality: N/A;  . HERNIA REPAIR  2013  . LUMBAR SPINE SURGERY    . UPPER GI ENDOSCOPY  Sept 2015   Dr Rayann Heman    Prior to Admission medications   Medication Sig Start Date End Date Taking? Authorizing Provider  amiodarone (PACERONE) 200 MG tablet Take 1 tablet (200 mg total) by mouth daily. 09/24/18  Yes Salary, Avel Peace, MD  Droxidopa (NORTHERA) 100 MG CAPS Take 600 mg by mouth 3 (three) times daily. Patient taking differently: Take 400 mg by mouth 3 (three) times daily.  10/13/18  Yes [name], MD  feeding supplement, ENSURE ENLIVE, (ENSURE ENLIVE) LIQD Take 237 mLs by mouth daily. 09/23/18  Yes Salary, Avel Peace, MD  fenofibrate micronized (ANTARA) 130 MG capsule TAKE 1 CAPSULE BY MOUTH  DAILY BEFORE BREAKFAST 08/24/18  Yes [name], MD  finasteride (PROSCAR) 5 MG tablet Take 1 tablet (5 mg total) by mouth daily. 10/16/18  Yes Stoioff, Ronda Fairly, MD  fludrocortisone (FLORINEF) 0.1 MG tablet Take 1 tablet (0.1 mg total) by mouth daily. 10/14/18  Yes Stoioff, Ronda Fairly, MD  levobunolol (BETAGAN) 0.5 % ophthalmic solution Place 1 drop into the right eye 2 (two) times daily.    Yes [provider]  levothyroxine (SYNTHROID, LEVOTHROID) 50 MCG tablet Take 1 tablet (50 mcg total) by mouth daily at 6 (six) AM. 09/24/18  Yes Salary, Montell D, MD  midodrine (PROAMATINE) 10 MG tablet Take 1 tablet (10 mg total) by mouth 3 (three) times daily. 11/18/18  Yes [name], MD  midodrine (PROAMATINE) 5 MG tablet Take 1 tablet (5 mg) three times a day as needed 11/18/18  Yes Gollan, Kathlene November, MD  nitroGLYCERIN (NITROSTAT) 0.4 MG SL tablet DISSOLVE UNDER THE TONGUE 1 TABLET EVERY 5 MINUTES AS NEEDED FOR CHEST  PAIN 06/11/16  Yes Gollan, Kathlene November, MD  ondansetron (ZOFRAN-ODT) 8 MG disintegrating tablet Take 0.5 tablets (4 mg total) by mouth every 8 (eight) hours as needed for nausea. 09/16/18  Yes Guse, Jacquelynn Cree, FNP  pantoprazole (PROTONIX) 40 MG tablet Take 1 tablet (40 mg total) by mouth 2 (two) times daily. 11/16/18 02/14/19 Yes [name], MD  potassium chloride (K-DUR) 10 MEQ tablet Take 1 tablet (10 mEq total) by mouth daily. 11/02/18 01/31/19 Yes Gollan, Kathlene November, MD  pravastatin (PRAVACHOL) 40 MG tablet TAKE 1 TABLET BY MOUTH AT  BEDTIME 06/08/18  Yes [name], MD  pyridostigmine (MESTINON) 180 MG CR tablet Take 1 tablet (180 mg total) by mouth daily. 12/14/18  Yes Gollan, Kathlene November, MD  QC NATURA-LAX powder MIX AND TAKE 17 GM (LINE ON INSIDE OF LID) TWICE DAILY AS NEEDED 12/04/18  Yes [name], MD  ranolazine (RANEXA) 500 MG 12 hr tablet Take 1 tablet (500  mg total) by mouth 2 (two) times daily. 11/26/18  Yes Gollan, Kathlene November, MD  tamsulosin (FLOMAX) 0.4 MG CAPS capsule Take 0.4 mg by mouth as needed.   Yes [provider]  Vitamin D, Ergocalciferol, (DRISDOL) 1.25 MG (50000 UT) CAPS capsule Take 50,000 Units by mouth every 7 (seven) days.  10/30/18 12/19/18 Yes [provider]  XARELTO 20 MG TABS tablet TAKE 1 TABLET BY MOUTH  DAILY 11/06/18  Yes Gollan, Kathlene November, MD    Family History  Problem Relation Age of Onset  . Stroke Mother   . Colon cancer Father   . Coronary artery disease Brother   . Other Brother 66       lymes dz     Social History   Tobacco Use  . Smoking status: Never Smoker  . Smokeless tobacco: Never Used  Substance Use Topics  . Alcohol use: No  . Drug use: No    Allergies as of 12/15/2018 - Review Complete 12/15/2018  Allergen Reaction Noted  . Bee venom Swelling 07/17/2015  . Sulfasalazine Hives and Swelling 01/22/2016  . Sulfa antibiotics Hives and Swelling 01/22/2016  . Warfarin and related Other (See Comments) 05/18/2012     Review of Systems:    All systems reviewed and negative except where noted in HPI.   Physical Exam:  Vital signs in last 24 hours: Temp:  [97.5 F (36.4 C)-98.7 F (37.1 C)] 97.5 F (36.4 C) (04/30 1707) Pulse Rate:  [63-97] 97 (04/30 1711) Resp:  [16-20] 16 (04/30 1707) BP: (89-192)/(60-105) 94/60 (04/30 1711) SpO2:  [96 %-100 %] 100 % (04/30 1711) Weight:  [82.7 kg] 82.7 kg (04/30 0335) Last BM Date: 12/17/18 General:   Pleasant, cooperative in NAD Head:  Normocephalic and atraumatic. Eyes:   No icterus.   Conjunctiva pink. PERRLA. Ears:  Normal auditory acuity. Neck:  Supple; no masses or thyroidomegaly Lungs: Respirations even and unlabored. Lungs clear to auscultation bilaterally.   No wheezes, crackles, or rhonchi.  Heart:  Regular rate and rhythm;  Without murmur, clicks, rubs or gallops Abdomen:  Soft, nondistended, nontender despite performing deep palpation in upper and lower abdomen. Normal bowel sounds. No appreciable masses or hepatomegaly.  No rebound or guarding.  Rectal:  Not performed. Msk:  Symmetrical without gross deformities.  Strength generalized weakness Extremities:  Without edema, cyanosis or clubbing. Neurologic:  Alert and oriented x3;  grossly normal neurologically. Skin:  Intact without significant lesions or rashes. Psych:  Alert and cooperative. Normal affect.  LAB RESULTS: CBC Latest Ref Rng & Units 12/16/2018 12/15/2018 10/23/2018  WBC 4.0 - 10.5 K/uL 8.4 6.5 7.5  Hemoglobin 13.0 - 17.0 g/dL 9.6(L) 8.9(L) 10.5(L)  Hematocrit 39.0 - 52.0 % 28.6(L) 25.2(L) 31.0(L)  Platelets 150 - 400 K/uL 246 310 267    BMET BMP Latest Ref Rng & Units 12/17/2018 12/16/2018 12/15/2018  Glucose 70 - 99 mg/dL - 116(H) 144(H)  BUN 8 - 23 mg/dL - 15 15  Creatinine 0.61 - 1.24 mg/dL - 0.82 1.17  Sodium 135 - 145 mmol/L - 137 134(L)  Potassium 3.5 - 5.1 mmol/L 3.7 3.2(L) 3.4(L)  Chloride 98 - 111 mmol/L - 105 99  CO2 22 - 32 mmol/L - 23 25  Calcium 8.9 - 10.3 mg/dL  - 8.7(L) 9.2    LFT Hepatic Function Latest Ref Rng & Units 12/16/2018 12/15/2018 10/23/2018  Total Protein 6.5 - 8.1 g/dL 5.7(L) 6.4(L) 6.6  Albumin 3.5 - 5.0 g/dL 3.3(L) 3.8 4.0  AST 15 -  41 U/L 28 33 27  ALT 0 - 44 U/L _0 Alk Phosphatase 38 - 126 U/L 30(L) 32(L) 26(L)  Total Bilirubin 0.3 - 1.2 mg/dL 2.9(H) 2.2(H) 1.9(H)  Bilirubin, Direct 0.0 - 0.2 mg/dL 0.9(H) - -     STUDIES: No results found.    Impression / Plan:   William Taylor is a 85 y.o. male with cold agglutinin hemolytic anemia, history of prostate cancer, paroxysmal A. fib on Xarelto admitted with dizziness, orthostatic hypotension.  GI is consulted to comment on hyperbilirubinemia and lower abdominal pain Patient has predominantly indirect bilirubin elevated most likely secondary to underlying cold agglutinin hemolytic anemia.  He has significant macrocytic anemia.  Normal B12 levels.  Lower abdominal pain appears to be a nonspecific symptom and he was completely nontender on deep palpation.  I do not recommend further GI work-up at this time unless his pain becomes progressive Can try Bentyl or Levsin as needed for lower abdominal pain   Thank you for involving me in the care of this patient.      LOS: 0 days   Sherri Sear, MD  12/17/2018, 6:17 PM   Note: This dictation was prepared with Dragon dictation along with smaller phrase technology. Any transcriptional errors that result from this process are unintentional.

## 2018-12-17 NOTE — Progress Notes (Signed)
Patients BP sitting 140's/80's , standing SBP in 80s ,  MD notified, orders to give midodrine.

## 2018-12-17 NOTE — Evaluation (Addendum)
Clinical/Bedside Swallow Evaluation Patient Details  Name: William Taylor MRN: [id number] Date of Birth: 08-30-1929  Today's Date: 12/17/2018 Time: SLP Start Time (ACUTE ONLY): 53 SLP Stop Time (ACUTE ONLY): 1420 SLP Time Calculation (min) (ACUTE ONLY): 60 min  Past Medical History:  Past Medical History:  Diagnosis Date  . Cancer [facility])    Prostate, followed by Dr. Jacqlyn Larsen  . Colon polyp   . Coronary artery disease   . Hyperlipidemia   . Hypertension   . MI (myocardial infarction) ([place])    2015  . PAF (paroxysmal atrial fibrillation) ([place])    a. on xarelto  . Skin cancer    Past Surgical History:  Past Surgical History:  Procedure Laterality Date  . CARDIAC CAT[facility]IZATION  [facility]   [facility]  . CATARACT EXTRACTION  Oct. 3, 2012   right eye  . colonoscopy    . COLONOSCOPY W/ POLYPECTOMY  2015   Dr Rayann Heman  . COLONOSCOPY WITH PROPOFOL N/A 08/15/2016   Procedure: COLONOSCOPY WITH PROPOFOL;  Surgeon: [name], MD;  Location: [facility] ENDOSCOPY;  Service: Endoscopy;  Laterality: N/A;  . CORONARY ANGIOPLASTY  2009   2005; s/p stent  . ENTEROSCOPY N/A 09/22/2018   Procedure: ENTEROSCOPY;  Surgeon: Lin Landsman, MD;  Location: [place];  Service: Gastroenterology;  Laterality: N/A;  . ESOPHAGOGASTRODUODENOSCOPY (EGD) WITH PROPOFOL N/A 08/15/2016   Procedure: ESOPHAGOGASTRODUODENOSCOPY (EGD) WITH PROPOFOL;  Surgeon: [name], MD;  Location: [facility] ENDOSCOPY;  Service: Endoscopy;  Laterality: N/A;  . GIVENS CAPSULE STUDY N/A 09/26/2016   Procedure: GIVENS CAPSULE STUDY;  Surgeon: [name], MD;  Location: [facility] ENDOSCOPY;  Service: Endoscopy;  Laterality: N/A;  . HERNIA REPAIR  2013  . LUMBAR SPINE SURGERY    . UPPER GI ENDOSCOPY  Sept 2015   Dr Rayann Heman   HPI:  Pt is an 85 y.o. male history of prostate cancer, coronary artery disease, hyperlipidemia, hypertension, coronary artery disease, paroxysmal atrial fibrillation who is presenting to the emergency room with complaint of  dizziness and orthostatic hypotension.  Patient states that his been feeling weak for for long period of time and with progressively gotten worse.This morning, patient was standing up with assistance from the nursing staff and briefly passed out with associated drop in blood pressure.  For ~3 months, pt has not been eating or drinking much due to feeling so nauseated even though he wants to eat, he just cannot seem to hold anything down due to feeling so nauseated per his and his Wife's report(chart notes). Pt verbalizes and points to an area in his upper-mid sternum area where he feels the discomfort when eating "even 2-3 bites before I have to stop eating". He denies any coughing or choking when drinking liquids at home.    Assessment / Plan / Recommendation Clinical Impression  Pt appears to present w/ adequate oropharyngeal phase swallowing function w/ no overt s/s of aspiration noted w/ po trials given. Pt does have some reported Esophageal Dysmotility s/s and/or Reflux issues - he is currently on a PPI. Last upper endoscopy was reportedly "normal" for the Esophagus though a diverticulum was found in the small bowel. Any issues, including Reflux, impacting Esophageal motility creating Dysmotility can create potential for Regurgitation and Reflux which can further impact the Pulmonary system w/ potential for decline. Per pt and Wife report, pt has had increasing wieght loss, weakness, and dizziness over the past 3 months w/ pt not being able to eat or drink much due to feeling so nauseated even  though he wants to eat. Per his report, pt stated he "cannot seem to hold anything down due to feeling so nauseated sometimes". Pt stated MDs feel this could be d/t his blood pressure issues; noted concern for neurologic disease causing severe autonomic dysfunction and have referred him for f/u. Pt positioned upright in bed to sit and feed self po trials presented by SLP. He consumed trials of ice chips, thin liquids,  and purees/soft solids including fruit w/ no overt s/s of aspiration noted; no decline in respiratory effort. Vocal quality was clear post trials. Oral phase was [facility] for bolus management, A-P transfer, and oral clearing post swallowing. OM exam was [facility]; pt wears Dentures/partial which were present. Pt fed self indepedently. Pt did c/o min globus feeling/dysmotility pointing to upper sternum area which passed w/ time. Recommend modifying diet to include cooked, moist and well-cut foods; thin liquids; general aspiration precautions; Pills in Puree - Whole for comfort swallowing. Recommend strict REFLUX precautions. Handouts on Reflux/Esophageal Dysmotility and Diet/Food Suggestions as well as preparation were given to pt for D/C home. NSG updated on pt's preference to use applesauce when swallowing pills/medicine. Pt seemed pleased w/ assessment, education, and discusion re: swallowing; options. Recommend f/u w/ GI for ongoing management/tx. NSG/MD to reconsult if any further needs/questions while admitted. NSG updated. SLP Visit Diagnosis: Dysphagia, pharyngoesophageal phase (R13.14);Dysphagia, unspecified (R13.10)(Esophageal Dysmotility)    Aspiration Risk  (reduced from an oropharyngeal phase standpoint)    Diet Recommendation  Mech Soft foods diet(moistened well); Thin liquids. General aspiration precautions; strict Reflux precautions  Medication Administration: Whole meds with puree(for easier swallowing)    Other  Recommendations Recommended Consults: Consider GI evaluation;Consider esophageal assessment(Dietician f/u) Oral Care Recommendations: Oral care BID;Patient independent with oral care(Dentures) Other Recommendations: (n/a)   Follow up Recommendations None      Frequency and Duration (n/a)  (n/a)       Prognosis Prognosis for Safe Diet Advancement: Fair Barriers to Reach Goals: Time post onset;Severity of deficits      Swallow Study   General Date of Onset: 12/15/18 HPI: Pt  is an 85 y.o. male history of prostate cancer, coronary artery disease, hyperlipidemia, hypertension, coronary artery disease, paroxysmal atrial fibrillation who is presenting to the emergency room with complaint of dizziness and orthostatic hypotension.  Patient states that his been feeling weak for for long period of time and with progressively gotten worse.This morning, patient was standing up with assistance from the nursing staff and briefly passed out with associated drop in blood pressure.  For ~3 months, pt has not been eating or drinking much due to feeling so nauseated even though he wants to eat, he just cannot seem to hold anything down due to feeling so nauseated per his and his Wife's report(chart notes). Pt verbalizes and points to an area in his upper-mid sternum area where he feels the discomfort when eating "even 2-3 bites before I have to stop eating". He denies any coughing or choking when drinking liquids at home.  Type of Study: Bedside Swallow Evaluation Previous Swallow Assessment: none Diet Prior to this Study: Regular;Thin liquids Temperature Spikes Noted: No(wbc 8.4) Respiratory Status: Room air History of Recent Intubation: No Behavior/Cognition: Alert;Cooperative;Pleasant mood Oral Cavity Assessment: Within Functional Limits Oral Care Completed by SLP: Recent completion by staff Oral Cavity - Dentition: Dentures, top;Dentures, bottom(partials) Vision: Functional for self-feeding Self-Feeding Abilities: Able to feed self;Needs set up(min given) Patient Positioning: Upright in bed(needed min positioning assist) Baseline Vocal Quality: Normal Volitional Cough: Strong Volitional  Swallow: Able to elicit    Oral/Motor/Sensory Function Overall Oral Motor/Sensory Function: Within functional limits   Ice Chips Ice chips: Within functional limits Presentation: Spoon(1 trial)   Thin Liquid Thin Liquid: Within functional limits Presentation: Cup;Self Fed(8-9 trials)    Nectar  Thick Nectar Thick Liquid: Not tested   Honey Thick Honey Thick Liquid: Not tested   Puree Puree: Within functional limits Presentation: Self Fed;Spoon(5 trials)   Solid     Solid: Within functional limits(mech soft foods) Presentation: Self Fed;Spoon(5 trials of graham cracker; cut fruit)       Orinda Kenner, MS, CCC-SLP , 12/17/2018,3:19 PM

## 2018-12-17 NOTE — Progress Notes (Signed)
Progress Note  Patient Name: William Taylor Date of Encounter: 12/17/2018  Primary Cardiologist: Rockey Situ  Subjective   Less nausea and dry heaving with medications with addition of apple sauce. Cortisol and ACTH stim test normal. Has been evaluated by neurology with no further inpatient workup recommended. No orthostatics this morning. He has no interest in becoming wheelchair-bound in the setting of his orthostasis.   Inpatient Medications    Scheduled Meds: . amiodarone  100 mg Oral Daily  . Droxidopa  400 mg Oral TID  . feeding supplement (ENSURE ENLIVE)  237 mL Oral BID BM  . finasteride  5 mg Oral Daily  . fludrocortisone  0.2 mg Oral Daily  . levobunolol  1 drop Right Eye BID  . levothyroxine  50 mcg Oral Q0600  . midodrine  10 mg Oral TID  . multivitamin with minerals  1 tablet Oral Daily  . pantoprazole  40 mg Oral BID  . pravastatin  40 mg Oral QHS  . pyridostigmine  60 mg Oral Q8H  . ranolazine  500 mg Oral BID  . rivaroxaban  20 mg Oral Daily  . sodium chloride flush  3 mL Intravenous Q12H   Continuous Infusions: . sodium chloride    . sodium chloride    . sodium chloride 75 mL/hr at 12/17/18 0300   PRN Meds: sodium chloride, acetaminophen **OR** acetaminophen, hydrALAZINE, nitroGLYCERIN, ondansetron **OR** ondansetron (ZOFRAN) IV, sodium chloride flush   Vital Signs    Vitals:   12/16/18 2044 12/16/18 2138 12/17/18 0335 12/17/18 0904  BP: (!) 172/92 (!) 157/91 (!) 145/78 (!) 141/89  Pulse: 66 73 63 71  Resp: 20   16  Temp: 98.3 F (36.8 C)  98.7 F (37.1 C) 97.8 F (36.6 C)  TempSrc: Oral  Oral Oral  SpO2: 100%  97% 96%  Weight:   82.7 kg   Height:        Intake/Output Summary (Last 24 hours) at 12/17/2018 1002 Last data filed at 12/17/2018 0941 Gross per 24 hour  Intake 1314.32 ml  Output 500 ml  Net 814.32 ml   Filed Weights   12/15/18 1005 12/15/18 1659 12/17/18 0335  Weight: 81.6 kg 83.3 kg 82.7 kg    Telemetry    Sinus with  first degree AV block - Personally Reviewed  ECG    n/a - Personally Reviewed  Physical Exam   GEN: No acute distress, elderly.   Neck: No JVD. Cardiac: RRR, no murmurs, rubs, or gallops.  Respiratory: Clear to auscultation bilaterally.  GI: Soft, nontender, non-distended.   MS: No edema; No deformity. Neuro:  Alert and oriented x 3; Nonfocal.  Psych: Normal affect.  Labs    Chemistry Recent Labs  Lab 12/15/18 1022 12/16/18 0505 12/17/18 0500  NA 134* 137  --   K 3.4* 3.2* 3.7  CL 99 105  --   CO2 25 23  --   GLUCOSE 144* 116*  --   BUN 15 15  --   CREATININE 1.17 0.82  --   CALCIUM 9.2 8.7*  --   PROT 6.4* 5.7*  --   ALBUMIN 3.8 3.3*  --   AST 33 28  --   ALT 12 11  --   ALKPHOS 32* 30*  --   BILITOT 2.2* 2.9*  --   GFRNONAA 56* >60  --   GFRAA >60 >60  --   ANIONGAP 10 9  --      Hematology Recent Labs  Lab 12/15/18 1108 12/16/18 0505  WBC 6.5 8.4  RBC 2.35*  2.44* 2.75*  HGB 8.9* 9.6*  HCT 25.2* 28.6*  MCV 107.2* 104.0*  MCH 37.9* 34.9*  MCHC 35.3 33.6  RDW 13.6 15.0  PLT 310 246    Cardiac Enzymes Recent Labs  Lab 12/15/18 1022  TROPONINI <0.03   No results for input(s): TROPIPOC in the last 168 hours.   BNPNo results for input(s): BNP, PROBNP in the last 168 hours.   DDimer No results for input(s): DDIMER in the last 168 hours.   Radiology    No results found.  Cardiac Studies   2D Echo 08/2018: - Left ventricle: The cavity size was normal. There was mild concentric hypertrophy. Systolic function was normal. The estimated ejection fraction was in the range of 55% to 60%. Wall motion was normal; there were no regional wall motion abnormalities. Doppler parameters are consistent with abnormal left ventricular relaxation (grade 1 diastolic dysfunction). - Aortic valve: There was trivial regurgitation. - Left atrium: The atrium was normal in size. - Right ventricle: Systolic function was normal. - Pulmonary arteries:  Systolic pressure could not be accurately estimated. __________  Myoview 09/2017: Pharmacological myocardial perfusion imaging study with no significant ischemia Normal wall motion, EF estimated at 69% No EKG changes concerning for ischemia at peak stress or in recovery. Low risk scan  Patient Profile     85 y.o. male with history of CAD status post stenting to the LAD in 2005 status post PCI to the OM 2 in 2012, autonomic dysfunction with persistent orthostatic hypotension, PAF on Xarelto, PE in 2006 with subsequent PE in 2012, cold agglutinin hemolytic anemia, prostate cancer, and hyperlipidemia who is being seen today for the evaluation of orthostatic hypotension at the request of Dr. Anselm Jungling.  Assessment & Plan    1. Persistent orthostatic hypotension: -Possibly exacerbated by the patient's weight loss and decreased p.o. consumption as well as increased vagal tone with dry heaving -Check orthostatic vitals -Continue increased dose of Florinef as well as current doses of midodrine, Mestinon, and Northera -Cortisol and ACTH stim test normal this morning -Recent MRI brain unrevealing -Continue thigh-high lower extremity compression hose and abdominal binder -Patient's family is requesting referral to autonomic disorder clinic through [facility] or [place]. The patient's primary cardiologist is working on this referral -Repeat orthostatic vital signs later today or on 4/30 -It may be best for the patient to not depend on his walker quite as much and be wheelchair-bound in an effort to minimize his orthostasis, though he has no interest in this  2.  Loss of appetite/weight loss/nausea: -Over the past couple of months the patient's weight is down almost 40 pounds.  He indicates he is not eating or drinking well secondary to dry heaving with associated nausea.  Unable to exclude possible vagal episodes contributing to his known near syncope with persistent orthostatic hypotension.  -Symptoms improved with addition of apple sauce to medications -Continue decreased dose of amiodarone  -His Flomax was also held and he will need close monitoring for urinary retention given his prostate cancer -Check EKG this morning to evaluate QT given he is on Ranexa (which can also contribute to orthostasis and nausea)  3.  CAD involving the native coronary arteries without angina: -No symptoms concerning for ischemia -On Xarelto in place of aspirin -Continue ranolazine -No plans for ischemic evaluation at this time  4.  PAF: -Maintaining sinus rhythm -Not currently on beta-blocker because of gentle blocker given persistent  orthostatic hypotension -Remains on amiodarone with normal AST/ALT and TSH this admission -Continue Xarelto  5.  History of PE: -Remains on Xarelto  6.  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    Signed, Christell Faith, PA-C [place] Pager: [phone] 12/17/2018, 10:02 AM

## 2018-12-17 NOTE — Plan of Care (Signed)
  Problem: Clinical Measurements: Goal: Ability to maintain clinical measurements within normal limits will improve Outcome: Progressing Goal: Will remain free from infection Outcome: Progressing Goal: Cardiovascular complication will be avoided Outcome: Progressing   Problem: Safety: Goal: Ability to remain free from injury will improve Outcome: Progressing   Problem: Skin Integrity: Goal: Risk for impaired skin integrity will decrease Outcome: Progressing

## 2018-12-17 NOTE — Progress Notes (Signed)
[place] [facility] [place] NAME: William Taylor    MR#:  [id number]  DATE OF BIRTH:  September 14, 1929  SUBJECTIVE:  CHIEF COMPLAINT:   Chief Complaint  Patient presents with  . Hypotension    orthostatic   Patient with recurrent and persistent orthostatic hypotension problem for last few months he also had losing weight and appetite.  He is dizziness and orthostatic vitals are much improved with changes in the medications. Continue to no complaint of nausea and some abdominal discomfort. REVIEW OF SYSTEMS:  CONSTITUTIONAL: No fever, fatigue or weakness.  EYES: No blurred or double vision.  EARS, NOSE, AND THROAT: No tinnitus or ear pain.  RESPIRATORY: No cough, shortness of breath, wheezing or hemoptysis.  CARDIOVASCULAR: No chest pain, orthopnea, edema.  GASTROINTESTINAL: No nausea, vomiting, diarrhea or abdominal pain.  GENITOURINARY: No dysuria, hematuria.  ENDOCRINE: No polyuria, nocturia,  HEMATOLOGY: No anemia, easy bruising or bleeding SKIN: No rash or lesion. MUSCULOSKELETAL: No joint pain or arthritis.   NEUROLOGIC: No tingling, numbness, weakness.  PSYCHIATRY: No anxiety or depression.   ROS  DRUG ALLERGIES:   Allergies  Allergen Reactions  . Bee Venom Swelling  . Sulfasalazine Hives and Swelling  . Sulfa Antibiotics Hives and Swelling  . Warfarin And Related Other (See Comments)    Chest pain    VITALS:  Blood pressure (!) 145/87, pulse 78, temperature 97.8 F (36.6 C), temperature source Oral, resp. rate 16, height 6\' 4"  (1.93 m), weight 82.7 kg, SpO2 99 %.  PHYSICAL EXAMINATION:  GENERAL:  85 y.o.-year-old patient lying in the bed with no acute distress.  EYES: Pupils equal, round, reactive to light and accommodation. No scleral icterus. Extraocular muscles intact.  HEENT: Head atraumatic, normocephalic. Oropharynx and nasopharynx clear.  NECK:  Supple, no jugular venous distention. No thyroid enlargement, no tenderness.   LUNGS: Normal breath sounds bilaterally, no wheezing, rales,rhonchi or crepitation. No use of accessory muscles of respiration.  CARDIOVASCULAR: S1, S2 normal. No murmurs, rubs, or gallops.  ABDOMEN: Soft, nontender, nondistended. Bowel sounds present. No organomegaly or mass.  EXTREMITIES: No pedal edema, cyanosis, or clubbing.  NEUROLOGIC: Cranial nerves II through XII are intact. Muscle strength 5/5 in all extremities. Sensation intact. Gait not checked.  PSYCHIATRIC: The patient is alert and oriented x 3.  SKIN: No obvious rash, lesion, or ulcer.   Physical Exam LABORATORY PANEL:   CBC Recent Labs  Lab 12/16/18 0505  WBC 8.4  HGB 9.6*  HCT 28.6*  PLT 246   ------------------------------------------------------------------------------------------------------------------  Chemistries  Recent Labs  Lab 12/16/18 0505 12/17/18 0500  NA 137  --   K 3.2* 3.7  CL 105  --   CO2 23  --   GLUCOSE 116*  --   BUN 15  --   CREATININE 0.82  --   CALCIUM 8.7*  --   AST 28  --   ALT 11  --   ALKPHOS 30*  --   BILITOT 2.9*  --    ------------------------------------------------------------------------------------------------------------------  Cardiac Enzymes Recent Labs  Lab 12/15/18 1022  TROPONINI <0.03   ------------------------------------------------------------------------------------------------------------------  RADIOLOGY:  No results found.  ASSESSMENT AND PLAN:   Active Problems:   Dizziness  Patient is 85 year old with history of prostate cancer and coronary artery disease and hyperlipidemia presenting with generalized weakness and orthostatic hypotension  1.  Orthostatic hypotension patient already on midodrine, florinef Follows with cardiologist as outpatient, I have called the consult. Cardiologist have suggested to decrease amiodarone and  stopping his tamsulosin and fenofibrate and monitor for urinary retention.  Also advised to increase Florinef  0.2 mg daily. Advised to continue current dose of midodrine, pyridostigmine, droxidopa. Cardiology suggested to check for ACTH and cosyntropin stimulation test-which were done and normal. Patient's hypertension is slightly better today.  2.  Atrial fibrillation continue amiodarone and Xarelto  3.  Worsening anemia the ED physician discussed the case with patient's primary oncologist who recommended patient get transfused 1 unit of packed RBCs which the ED physician has already ordered Patient have cold agglutinin antibodies.  Was given 1 unit of blood transfusion. Extensive work-up was done by GI in recent past so does not need further GI work-up.  4.  BPH continue Proscar  5.  GERD continue Protonix  6.  Hyperlipidemia continue Pravachol  7.  Dysphagia-he was not able to swallow pills with liquids.  Swallow evaluation.  8.  Hyperbilirubinemia-GI consult as patient also complained of some nausea, weight loss.  All the records are reviewed and case discussed with Care Management/Social Workerr. Management plans discussed with the patient, family and they are in agreement.  CODE STATUS: Full.  TOTAL TIME TAKING CARE OF THIS PATIENT: 35 minutes.    POSSIBLE D/C IN 1-2 DAYS, DEPENDING ON CLINICAL CONDITION.   Vaughan Basta M.D on 12/17/2018   Between 7am to 6pm - Pager - [phone]  After 6pm go to [email or web address] - password [email or web address] [facility]  Office  [phone]  CC: Primary care physician; [name], MD  Note: This dictation was prepared with Dragon dictation along with smaller phrase technology. Any transcriptional errors that result from this process are unintentional.

## 2018-12-18 DIAGNOSIS — Z86711 Personal history of pulmonary embolism: Secondary | ICD-10-CM | POA: Diagnosis not present

## 2018-12-18 DIAGNOSIS — G909 Disorder of the autonomic nervous system, unspecified: Secondary | ICD-10-CM

## 2018-12-18 DIAGNOSIS — I252 Old myocardial infarction: Secondary | ICD-10-CM | POA: Diagnosis not present

## 2018-12-18 DIAGNOSIS — Z66 Do not resuscitate: Secondary | ICD-10-CM | POA: Diagnosis not present

## 2018-12-18 DIAGNOSIS — I482 Chronic atrial fibrillation, unspecified: Secondary | ICD-10-CM | POA: Diagnosis not present

## 2018-12-18 DIAGNOSIS — D539 Nutritional anemia, unspecified: Secondary | ICD-10-CM | POA: Diagnosis present

## 2018-12-18 DIAGNOSIS — Z888 Allergy status to other drugs, medicaments and biological substances status: Secondary | ICD-10-CM | POA: Diagnosis not present

## 2018-12-18 DIAGNOSIS — R110 Nausea: Secondary | ICD-10-CM | POA: Diagnosis not present

## 2018-12-18 DIAGNOSIS — R1310 Dysphagia, unspecified: Secondary | ICD-10-CM | POA: Diagnosis present

## 2018-12-18 DIAGNOSIS — K219 Gastro-esophageal reflux disease without esophagitis: Secondary | ICD-10-CM | POA: Diagnosis present

## 2018-12-18 DIAGNOSIS — E43 Unspecified severe protein-calorie malnutrition: Secondary | ICD-10-CM

## 2018-12-18 DIAGNOSIS — I480 Paroxysmal atrial fibrillation: Secondary | ICD-10-CM | POA: Diagnosis present

## 2018-12-18 DIAGNOSIS — I2510 Atherosclerotic heart disease of native coronary artery without angina pectoris: Secondary | ICD-10-CM | POA: Diagnosis not present

## 2018-12-18 DIAGNOSIS — Z800 Family history of malignant neoplasm of digestive organs: Secondary | ICD-10-CM | POA: Diagnosis not present

## 2018-12-18 DIAGNOSIS — Z86010 Personal history of colonic polyps: Secondary | ICD-10-CM | POA: Diagnosis not present

## 2018-12-18 DIAGNOSIS — Z85828 Personal history of other malignant neoplasm of skin: Secondary | ICD-10-CM | POA: Diagnosis not present

## 2018-12-18 DIAGNOSIS — C61 Malignant neoplasm of prostate: Secondary | ICD-10-CM | POA: Diagnosis not present

## 2018-12-18 DIAGNOSIS — Z955 Presence of coronary angioplasty implant and graft: Secondary | ICD-10-CM | POA: Diagnosis not present

## 2018-12-18 DIAGNOSIS — I4891 Unspecified atrial fibrillation: Secondary | ICD-10-CM | POA: Diagnosis not present

## 2018-12-18 DIAGNOSIS — Z882 Allergy status to sulfonamides status: Secondary | ICD-10-CM | POA: Diagnosis not present

## 2018-12-18 DIAGNOSIS — N400 Enlarged prostate without lower urinary tract symptoms: Secondary | ICD-10-CM | POA: Diagnosis not present

## 2018-12-18 DIAGNOSIS — I10 Essential (primary) hypertension: Secondary | ICD-10-CM | POA: Diagnosis not present

## 2018-12-18 DIAGNOSIS — I951 Orthostatic hypotension: Secondary | ICD-10-CM | POA: Diagnosis not present

## 2018-12-18 DIAGNOSIS — Z8249 Family history of ischemic heart disease and other diseases of the circulatory system: Secondary | ICD-10-CM | POA: Diagnosis not present

## 2018-12-18 DIAGNOSIS — K8020 Calculus of gallbladder without cholecystitis without obstruction: Secondary | ICD-10-CM | POA: Diagnosis present

## 2018-12-18 DIAGNOSIS — D591 Other autoimmune hemolytic anemias: Secondary | ICD-10-CM | POA: Diagnosis not present

## 2018-12-18 DIAGNOSIS — E785 Hyperlipidemia, unspecified: Secondary | ICD-10-CM | POA: Diagnosis not present

## 2018-12-18 DIAGNOSIS — D649 Anemia, unspecified: Secondary | ICD-10-CM | POA: Diagnosis not present

## 2018-12-18 LAB — COMPREHENSIVE METABOLIC PANEL
ALT: 12 U/L (ref 0–44)
AST: 33 U/L (ref 15–41)
Albumin: 3.1 g/dL — ABNORMAL LOW (ref 3.5–5.0)
Alkaline Phosphatase: 27 U/L — ABNORMAL LOW (ref 38–126)
Anion gap: 7 (ref 5–15)
BUN: 10 mg/dL (ref 8–23)
CO2: 25 mmol/L (ref 22–32)
Calcium: 8.7 mg/dL — ABNORMAL LOW (ref 8.9–10.3)
Chloride: 105 mmol/L (ref 98–111)
Creatinine, Ser: 0.67 mg/dL (ref 0.61–1.24)
GFR calc Af Amer: >60 mL/min (ref >60)
GFR calc non Af Amer: >60 mL/min (ref >60)
Glucose, Bld: 103 mg/dL — ABNORMAL HIGH (ref 70–99)
Potassium: 3.8 mmol/L (ref 3.5–5.1)
Sodium: 137 mmol/L (ref 135–145)
Total Bilirubin: 2.9 mg/dL — ABNORMAL HIGH (ref 0.3–1.2)
Total Protein: 5.5 g/dL — ABNORMAL LOW (ref 6.5–8.1)

## 2018-12-18 LAB — CBC
HCT: 28.5 % — ABNORMAL LOW (ref 39.0–52.0)
Hemoglobin: 10 g/dL — ABNORMAL LOW (ref 13.0–17.0)
MCH: 35.7 pg — ABNORMAL HIGH (ref 26.0–34.0)
MCHC: 35.1 g/dL (ref 30.0–36.0)
MCV: 101.8 fL — ABNORMAL HIGH (ref 80.0–100.0)
Platelets: 263 10*3/uL (ref 150–400)
RBC: 2.8 MIL/uL — ABNORMAL LOW (ref 4.22–5.81)
RDW: 14.1 % (ref 11.5–15.5)
WBC: 7.9 10*3/uL (ref 4.0–10.5)
nRBC: 0 % (ref 0.0–0.2)

## 2018-12-18 MED ORDER — PYRIDOSTIGMINE BROMIDE 60 MG PO TABS
60.0000 mg | ORAL_TABLET | ORAL | Status: DC
  Administered 2018-12-18 – 2018-12-20 (×4): 60 mg via ORAL
  Filled 2018-12-18 (×5): qty 1

## 2018-12-18 MED ORDER — DICYCLOMINE HCL 10 MG PO CAPS
10.0000 mg | ORAL_CAPSULE | Freq: Two times a day (BID) | ORAL | Status: DC
  Administered 2018-12-18 – 2018-12-20 (×4): 10 mg via ORAL
  Filled 2018-12-18 (×4): qty 1

## 2018-12-18 MED ORDER — ALUM & MAG HYDROXIDE-SIMETH 200-200-20 MG/5ML PO SUSP
15.0000 mL | ORAL | Status: DC | PRN
  Administered 2018-12-18: 15 mL via ORAL
  Filled 2018-12-18: qty 30

## 2018-12-18 NOTE — Progress Notes (Signed)
Progress Note  Patient Name: William Taylor Date of Encounter: 12/18/2018  Primary Cardiologist: Rockey Situ  Subjective   No chest pain or SOB this morning. Had episode of nausea, abdominal pain, and diarrhea yesterday that has intermittently continued this morning. HGB improved at 10. Orthostatics remain pending from 4/30. He is frustrated with the number of his pills per day.   Inpatient Medications    Scheduled Meds: . amiodarone  100 mg Oral Daily  . Droxidopa  400 mg Oral TID  . feeding supplement (ENSURE ENLIVE)  237 mL Oral BID BM  . finasteride  5 mg Oral Daily  . fludrocortisone  0.2 mg Oral Daily  . levobunolol  1 drop Right Eye BID  . levothyroxine  50 mcg Oral Q0600  . midodrine  10 mg Oral TID  . multivitamin with minerals  1 tablet Oral Daily  . pantoprazole  40 mg Oral BID  . pravastatin  40 mg Oral QHS  . pyridostigmine  60 mg Oral Q8H  . ranolazine  500 mg Oral BID  . rivaroxaban  20 mg Oral Daily  . sodium chloride flush  3 mL Intravenous Q12H   Continuous Infusions: . sodium chloride    . sodium chloride    . sodium chloride 75 mL/hr at 12/18/18 0406   PRN Meds: sodium chloride, acetaminophen **OR** acetaminophen, hydrALAZINE, hyoscyamine, nitroGLYCERIN, ondansetron **OR** ondansetron (ZOFRAN) IV, sodium chloride flush   Vital Signs    Vitals:   12/17/18 2149 12/17/18 2300 12/18/18 0344 12/18/18 0352  BP: (!) 185/97 (!) 172/97 (!) 166/92   Pulse: 76 75 70   Resp:      Temp:   98.2 F (36.8 C)   TempSrc:   Oral   SpO2:   99%   Weight:    83.1 kg  Height:        Intake/Output Summary (Last 24 hours) at 12/18/2018 0807 Last data filed at 12/18/2018 0346 Gross per 24 hour  Intake 480 ml  Output 2000 ml  Net -1520 ml   Filed Weights   12/15/18 1659 12/17/18 0335 12/18/18 0352  Weight: 83.3 kg 82.7 kg 83.1 kg    Telemetry    Not on tele - Personally Reviewed  ECG    n/a - Personally Reviewed  Physical Exam   GEN: No acute distress,  elderly appearing.   Neck: No JVD. Cardiac: RRR, no murmurs, rubs, or gallops.  Respiratory: Clear to auscultation bilaterally.  GI: Soft, nontender, non-distended.   MS: No edema; No deformity. Neuro:  Alert and oriented x 3; Nonfocal.  Psych: Normal affect.  Labs    Chemistry Recent Labs  Lab 12/15/18 1022 12/16/18 0505 12/17/18 0500 12/18/18 0252  NA 134* 137  --  137  K 3.4* 3.2* 3.7 3.8  CL 99 105  --  105  CO2 25 23  --  25  GLUCOSE 144* 116*  --  103*  BUN 15 15  --  10  CREATININE 1.17 0.82  --  0.67  CALCIUM 9.2 8.7*  --  8.7*  PROT 6.4* 5.7*  --  5.5*  ALBUMIN 3.8 3.3*  --  3.1*  AST 33 28  --  33  ALT 12 11  --  12  ALKPHOS 32* 30*  --  27*  BILITOT 2.2* 2.9*  --  2.9*  GFRNONAA 56* >60  --  >60  GFRAA >60 >60  --  >60  ANIONGAP 10 9  --  7  Hematology Recent Labs  Lab 12/15/18 1108 12/16/18 0505 12/18/18 0252  WBC 6.5 8.4 7.9  RBC 2.35*  2.44* 2.75* 2.80*  HGB 8.9* 9.6* 10.0*  HCT 25.2* 28.6* 28.5*  MCV 107.2* 104.0* 101.8*  MCH 37.9* 34.9* 35.7*  MCHC 35.3 33.6 35.1  RDW 13.6 15.0 14.1  PLT 310 246 263    Cardiac Enzymes Recent Labs  Lab 12/15/18 1022  TROPONINI <0.03   No results for input(s): TROPIPOC in the last 168 hours.   BNPNo results for input(s): BNP, PROBNP in the last 168 hours.   DDimer No results for input(s): DDIMER in the last 168 hours.   Radiology    No results found.  Cardiac Studies   2D Echo 08/2018: - Left ventricle: The cavity size was normal. There was mild concentric hypertrophy. Systolic function was normal. The estimated ejection fraction was in the range of 55% to 60%. Wall motion was normal; there were no regional wall motion abnormalities. Doppler parameters are consistent with abnormal left ventricular relaxation (grade 1 diastolic dysfunction). - Aortic valve: There was trivial regurgitation. - Left atrium: The atrium was normal in size. - Right ventricle: Systolic function was  normal. - Pulmonary arteries: Systolic pressure could not be accurately estimated. __________  Myoview 09/2017: Pharmacological myocardial perfusion imaging study with no significant ischemia Normal wall motion, EF estimated at 69% No EKG changes concerning for ischemia at peak stress or in recovery. Low risk scan  Patient Profile     85 y.o. male with history of CAD status post stenting to the LAD in 2005 status post PCI to the OM 2 in 2012, autonomic dysfunction with persistent orthostatic hypotension, PAF on Xarelto, PE in 2006 with subsequent PE in 2012, cold agglutinin hemolytic anemia, prostate cancer, and hyperlipidemiawho is being seen today for the evaluation of orthostatic hypotensionat the request of Dr. Anselm Jungling.  Assessment & Plan    1. Persistent orthostatic hypotension: -Possibly exacerbated by the patient's weight loss and decreased p.o. consumption as well as increased vagal tone with dry heaving -He is on maximal therapy or overall a difficult situation  -Continue increased dose of Florinef as well as current doses of midodrine, Mestinon, and Northera -Consider changing Mestinon to just bid and get rid of the evening dose -Cortisol and ACTH stim test normal this morning -Recent MRI brain unrevealing -Continue thigh-high lower extremity compression hose and abdominal binder -Patient's family is requesting referral to autonomic disorder clinic through [facility] or [place]. The patient's primary cardiologist is working on this referral -Repeat orthostatic vital signs  -It may be best for the patient to not depend on his walker quite as much and be wheelchair-bound in an effort to minimize his orthostasis, though he has no interest in this -We have consulted care manager to assist in getting him a hospital bed for home in an effort to prevent supine hypertension   2.Loss of appetite/weight loss/nausea/abdominal pain/diarrhea: -Over the past couple of months the  patient's weight is down almost 40 pounds. He indicates he is not eating or drinking well secondary to dry heaving with associated nausea. Unable to exclude possible vagal episodes contributing to his known near syncope with persistent orthostatic hypotension. -Symptoms improved with addition of apple sauce to medications -Continue decreased dose of amiodarone  -His Flomax was also held and he will need close monitoring for urinary retention given his prostate cancer -EKG not performed on 4/30, perform today -Abdominal symptoms per IM, cannot exclude increased vagal tone playing a role  in his orthostasis  3.CAD involving the native coronary arteries without angina: -No symptoms concerning for ischemia -On Xarelto in place of aspirin -Continue ranolazine -No plans for ischemic evaluation at this time  4.PAF: -Maintaining sinus rhythm -Not currently on beta-blocker because of gentle blocker given persistent orthostatic hypotension -Remains on amiodarone with normal AST/ALT and TSH this admission -Continue Xarelto  5.History of PE: -Remains on Xarelto  6.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    Signed, Christell Faith, PA-C [place] Pager: [phone] 12/18/2018, 8:07 AM

## 2018-12-18 NOTE — Progress Notes (Signed)
[facility] [place] NAME: William Taylor    MR#:  [id number]  DATE OF BIRTH:  10/19/1929  SUBJECTIVE:  CHIEF COMPLAINT:   Chief Complaint  Patient presents with  . Hypotension    orthostatic   Patient with recurrent and persistent orthostatic hypotension problem for last few months he also had losing weight and appetite.  He is dizziness and orthostatic vitals are much improved with changes in the medications. Continue to no complaint of nausea and some abdominal discomfort. REVIEW OF SYSTEMS:  CONSTITUTIONAL: No fever, fatigue or weakness.  EYES: No blurred or double vision.  EARS, NOSE, AND THROAT: No tinnitus or ear pain.  RESPIRATORY: No cough, shortness of breath, wheezing or hemoptysis.  CARDIOVASCULAR: No chest pain, orthopnea, edema.  GASTROINTESTINAL: No nausea, vomiting, diarrhea or abdominal pain.  GENITOURINARY: No dysuria, hematuria.  ENDOCRINE: No polyuria, nocturia,  HEMATOLOGY: No anemia, easy bruising or bleeding SKIN: No rash or lesion. MUSCULOSKELETAL: No joint pain or arthritis.   NEUROLOGIC: No tingling, numbness, weakness.  PSYCHIATRY: No anxiety or depression.   ROS  DRUG ALLERGIES:   Allergies  Allergen Reactions  . Bee Venom Swelling  . Sulfasalazine Hives and Swelling  . Sulfa Antibiotics Hives and Swelling  . Warfarin And Related Other (See Comments)    Chest pain    VITALS:  Blood pressure (!) 195/108, pulse 68, temperature 97.8 F (36.6 C), temperature source Oral, resp. rate 20, height 6\' 4"  (1.93 m), weight 83.1 kg, SpO2 99 %.  PHYSICAL EXAMINATION:  GENERAL:  85 y.o.-year-old patient lying in the bed with no acute distress.  EYES: Pupils equal, round, reactive to light and accommodation. No scleral icterus. Extraocular muscles intact.  HEENT: Head atraumatic, normocephalic. Oropharynx and nasopharynx clear.  NECK:  Supple, no jugular venous distention. No thyroid enlargement, no tenderness.   LUNGS: Normal breath sounds bilaterally, no wheezing, rales,rhonchi or crepitation. No use of accessory muscles of respiration.  CARDIOVASCULAR: S1, S2 normal. No murmurs, rubs, or gallops.  ABDOMEN: Soft, nontender, nondistended. Bowel sounds present. No organomegaly or mass.  EXTREMITIES: No pedal edema, cyanosis, or clubbing.  NEUROLOGIC: Cranial nerves II through XII are intact. Muscle strength 5/5 in all extremities. Sensation intact. Gait not checked.  PSYCHIATRIC: The patient is alert and oriented x 3.  SKIN: No obvious rash, lesion, or ulcer.   Physical Exam LABORATORY PANEL:   CBC Recent Labs  Lab 12/18/18 0252  WBC 7.9  HGB 10.0*  HCT 28.5*  PLT 263   ------------------------------------------------------------------------------------------------------------------  Chemistries  Recent Labs  Lab 12/18/18 0252  NA 137  K 3.8  CL 105  CO2 25  GLUCOSE 103*  BUN 10  CREATININE 0.67  CALCIUM 8.7*  AST 33  ALT 12  ALKPHOS 27*  BILITOT 2.9*   ------------------------------------------------------------------------------------------------------------------  Cardiac Enzymes Recent Labs  Lab 12/15/18 1022  TROPONINI <0.03   ------------------------------------------------------------------------------------------------------------------  RADIOLOGY:  No results found.  ASSESSMENT AND PLAN:   Active Problems:   Dizziness  Patient is 85 year old with history of prostate cancer and coronary artery disease and hyperlipidemia presenting with generalized weakness and orthostatic hypotension  1.  Orthostatic hypotension patient already on midodrine, florinef Follows with cardiologist as outpatient, I have called the consult. Cardiologist have suggested to decrease amiodarone and stopping his tamsulosin and fenofibrate and monitor for urinary retention.  Also advised to increase Florinef 0.2 mg daily. Advised to continue current dose of midodrine,  pyridostigmine, droxidopa. Cardiology suggested to check for ACTH and  cosyntropin stimulation test-which were done and normal. Patient's hypertension is slightly better today. Cardiology suggested get thigh-high stockings and hospital bed at home.  2.  Atrial fibrillation continue amiodarone and Xarelto  3.  Worsening anemia the ED physician discussed the case with patient's primary oncologist who recommended patient get transfused 1 unit of packed RBCs which the ED physician has already ordered Patient have cold agglutinin antibodies.  Was given 1 unit of blood transfusion. Extensive work-up was done by GI in recent past so does not need further GI work-up.  We will try to maintain hemoglobin around 10.  4.  BPH continue Proscar  5.  GERD continue Protonix  6.  Hyperlipidemia continue Pravachol  7.  Dysphagia-he was not able to swallow pills with liquids.  Swallow evaluation.  8.  Hyperbilirubinemia-GI consult as patient also complained of some nausea, weight loss. No new work-up advised by GI.  I have ordered right upper quadrant ultrasound.  All the records are reviewed and case discussed with Care Management/Social Workerr. Management plans discussed with the patient, family and they are in agreement.  CODE STATUS: Full.  TOTAL TIME TAKING CARE OF THIS PATIENT: 35 minutes.    POSSIBLE D/C IN 1-2 DAYS, DEPENDING ON CLINICAL CONDITION.   Vaughan Basta M.D on 12/18/2018   Between 7am to 6pm - Pager - [phone]  After 6pm go to [email or web address] - password [email or web address] [facility]  Office  [phone]  CC: Primary care physician; [name], MD  Note: This dictation was prepared with Dragon dictation along with smaller phrase technology. Any transcriptional errors that result from this process are unintentional.

## 2018-12-18 NOTE — Plan of Care (Signed)
Pt BP up to 206/108 this shift.  MD notified and verbal order received to hold Midodrine. Pt refused Droxidoba stating it would increase his BP further. MD aware. Hydralazine 2 mg IV given. BP 166/92 at present. Fall precautions maintained. Will continue to monitor.   Problem: Health Behavior/Discharge Planning: Goal: Ability to manage health-related needs will improve Outcome: Progressing   Problem: Clinical Measurements: Goal: Ability to maintain clinical measurements within normal limits will improve Outcome: Progressing Goal: Will remain free from infection Outcome: Progressing Goal: Respiratory complications will improve Outcome: Progressing   Problem: Safety: Goal: Ability to remain free from injury will improve Outcome: Progressing   Problem: Skin Integrity: Goal: Risk for impaired skin integrity will decrease Outcome: Progressing

## 2018-12-18 NOTE — TOC Progression Note (Addendum)
Transition of Care [facility]) - Progression Note    Patient Details  Name: William Taylor MRN: [id number] Date of Birth: 10-11-1929  Transition of Care [facility]) CM/SW Contact  Elza Rafter, RN Phone Number: 12/18/2018, 12:22 PM  Clinical Narrative:   Case management order for hospital bed.  Spoke with patient and explained why physician would like him to have a hospital bed to help with supine hypertension.  He is not interested in a hospital bed.  Notified Dr. Anselm Jungling.    [facility] care manager with [facility] returned my call and will follow at DC as patient does not want home health services at this time.     Expected Discharge Plan: Home/Self Care Barriers to Discharge: Continued Medical Work up  Expected Discharge Plan and Services Expected Discharge Plan: Home/Self Care   Discharge Planning Services: CM Consult   Living arrangements for the past 2 months: Single Family Home                                       Social Determinants of Health (SDOH) Interventions    Readmission Risk Interventions Readmission Risk Prevention Plan 12/16/2018  Transportation Screening Complete  PCP or Specialist Appt within 3-5 Days Not Complete  Not Complete comments No appt. as of yet; will make at [place] or [place] Complete  Social Work Consult for [place] Planning/Counseling [place] Not Applicable  Medication Review (RN Care Manager) Complete  Some recent data might be hidden

## 2018-12-18 NOTE — Plan of Care (Signed)
  Problem: Clinical Measurements: Goal: Ability to maintain clinical measurements within normal limits will improve Outcome: Not Progressing Note:  RBC's very low today at only 2.8. Will continue to monitor lab values. Wenda Low [facility]

## 2018-12-19 ENCOUNTER — Inpatient Hospital Stay: Payer: Medicare HMO

## 2018-12-19 LAB — COMPREHENSIVE METABOLIC PANEL
ALT: 13 U/L (ref 0–44)
AST: 37 U/L (ref 15–41)
Albumin: 3.2 g/dL — ABNORMAL LOW (ref 3.5–5.0)
Alkaline Phosphatase: 28 U/L — ABNORMAL LOW (ref 38–126)
Anion gap: 8 (ref 5–15)
BUN: 12 mg/dL (ref 8–23)
CO2: 26 mmol/L (ref 22–32)
Calcium: 8.8 mg/dL — ABNORMAL LOW (ref 8.9–10.3)
Chloride: 101 mmol/L (ref 98–111)
Creatinine, Ser: 0.61 mg/dL (ref 0.61–1.24)
GFR calc Af Amer: >60 mL/min (ref >60)
GFR calc non Af Amer: >60 mL/min (ref >60)
Glucose, Bld: 99 mg/dL (ref 70–99)
Potassium: 4.7 mmol/L (ref 3.5–5.1)
Sodium: 135 mmol/L (ref 135–145)
Total Bilirubin: 2.3 mg/dL — ABNORMAL HIGH (ref 0.3–1.2)
Total Protein: 5.7 g/dL — ABNORMAL LOW (ref 6.5–8.1)

## 2018-12-19 LAB — CBC
HCT: 29 % — ABNORMAL LOW (ref 39.0–52.0)
Hemoglobin: 10.4 g/dL — ABNORMAL LOW (ref 13.0–17.0)
MCH: 36.1 pg — ABNORMAL HIGH (ref 26.0–34.0)
MCHC: 35.9 g/dL (ref 30.0–36.0)
MCV: 100.7 fL — ABNORMAL HIGH (ref 80.0–100.0)
Platelets: 280 10*3/uL (ref 150–400)
RBC: 2.88 MIL/uL — ABNORMAL LOW (ref 4.22–5.81)
RDW: 14.1 % (ref 11.5–15.5)
WBC: 7.5 10*3/uL (ref 4.0–10.5)
nRBC: 0 % (ref 0.0–0.2)

## 2018-12-19 LAB — BILIRUBIN, DIRECT: Bilirubin, Direct: 0.9 mg/dL — ABNORMAL HIGH (ref 0.0–0.2)

## 2018-12-19 NOTE — Progress Notes (Signed)
Progress Note  Patient Name: William Taylor Date of Encounter: 12/19/2018  Primary Cardiologist: Ida Rogue, MD   Subjective   Continues to report nausea and epigastric discomfort when he tries to eat or drink.  He also continues to have orthostatic dizziness with positional changes.  Inpatient Medications    Scheduled Meds: . amiodarone  100 mg Oral Daily  . dicyclomine  10 mg Oral BID AC  . Droxidopa  400 mg Oral TID  . feeding supplement (ENSURE ENLIVE)  237 mL Oral BID BM  . finasteride  5 mg Oral Daily  . fludrocortisone  0.2 mg Oral Daily  . levobunolol  1 drop Right Eye BID  . levothyroxine  50 mcg Oral Q0600  . midodrine  10 mg Oral TID  . multivitamin with minerals  1 tablet Oral Daily  . pantoprazole  40 mg Oral BID  . pravastatin  40 mg Oral QHS  . pyridostigmine  60 mg Oral [id number]  . ranolazine  500 mg Oral BID  . rivaroxaban  20 mg Oral Daily  . sodium chloride flush  3 mL Intravenous Q12H   Continuous Infusions: . sodium chloride    . sodium chloride     PRN Meds: sodium chloride, acetaminophen **OR** acetaminophen, alum & mag hydroxide-simeth, hydrALAZINE, hyoscyamine, nitroGLYCERIN, ondansetron **OR** ondansetron (ZOFRAN) IV, sodium chloride flush   Vital Signs    Vitals:   12/19/18 0315 12/19/18 0443 12/19/18 0732 12/19/18 1045  BP: 136/89 (!) 153/98 (!) 170/98 112/82  Pulse: 76 82 78 80  Resp:  18    Temp:  97.8 F (36.6 C) 98.5 F (36.9 C)   TempSrc:  Oral Oral   SpO2:  98% 98% 98%  Weight:      Height:        Intake/Output Summary (Last 24 hours) at 12/19/2018 1419 Last data filed at 12/19/2018 0855 Gross per 24 hour  Intake 3 ml  Output 490 ml  Net -487 ml   Last 3 Weights 12/18/2018 12/17/2018 12/15/2018  Weight (lbs) 183 lb 3.2 oz 182 lb 5.1 oz 183 lb 9.6 oz  Weight (kg) 83.099 kg 82.7 kg 83.28 kg      Telemetry    Sinus rhythm.  No events- Personally Reviewed  ECG    n/a - Personally Reviewed  Physical Exam   VS:   BP 112/82 (BP Location: Right Arm)   Pulse 80   Temp 98.5 F (36.9 C) (Oral)   Resp 18   Ht 6\' 4"  (1.93 m)   Wt 83.1 kg   SpO2 98%   BMI 22.30 kg/m  , BMI Body mass index is 22.3 kg/m. GENERAL: Chronically ill-appearing elderly man in no acute distress. HEENT: Pupils equal round and reactive, fundi not visualized, oral mucosa unremarkable NECK:  No jugular venous distention, waveform within normal limits, carotid upstroke brisk and symmetric, no bruits LUNGS:  Clear to auscultation bilaterally HEART:  RRR.  PMI not displaced or sustained,S1 and S2 within normal limits, no S3, no S4, no clicks, no rubs, II/VI systolic murmurs ABD:  Flat, positive bowel sounds normal in frequency in pitch, no bruits, no rebound, no guarding, no midline pulsatile mass, no hepatomegaly, no splenomegaly EXT:  2 plus pulses throughout, no edema, no cyanosis no clubbing SKIN:  No rashes no nodules NEURO:  Cranial nerves II through XII grossly intact, motor grossly intact throughout PSYCH:  Cognitively intact, oriented to person place and time   [employer] Recent  Labs  Lab 12/16/18 0505 12/17/18 0500 12/18/18 0252 12/19/18 0430  NA 137  --  137 135  K 3.2* 3.7 3.8 4.7  CL 105  --  105 101  CO2 23  --  25 26  GLUCOSE 116*  --  103* 99  BUN 15  --  10 12  CREATININE 0.82  --  0.67 0.61  CALCIUM 8.7*  --  8.7* 8.8*  PROT 5.7*  --  5.5* 5.7*  ALBUMIN 3.3*  --  3.1* 3.2*  AST 28  --  33 37  ALT 11  --  12 13  ALKPHOS 30*  --  27* 28*  BILITOT 2.9*  --  2.9* 2.3*  GFRNONAA >60  --  >60 >60  GFRAA >60  --  >60 >60  ANIONGAP 9  --  7 8     Hematology Recent Labs  Lab 12/16/18 0505 12/18/18 0252 12/19/18 0617  WBC 8.4 7.9 7.5  RBC 2.75* 2.80* 2.88*  HGB 9.6* 10.0* 10.4*  HCT 28.6* 28.5* 29.0*  MCV 104.0* 101.8* 100.7*  MCH 34.9* 35.7* 36.1*  MCHC 33.6 35.1 35.9  RDW 15.0 14.1 14.1  PLT 246 263 280    Cardiac Enzymes Recent Labs  Lab 12/15/18 1022  TROPONINI <0.03   No  results for input(s): TROPIPOC in the last 168 hours.   BNPNo results for input(s): BNP, PROBNP in the last 168 hours.   DDimer No results for input(s): DDIMER in the last 168 hours.   Radiology    US Abdomen Limited Ruq  Result Date: 12/19/2018 CLINICAL DATA:  Abdominal pain EXAM: ULTRASOUND ABDOMEN LIMITED RIGHT UPPER QUADRANT COMPARISON:  09/05/2018 abdominal CT FINDINGS: Gallbladder: Fall/distended gallbladder but no generalized wall thickening or focal tenderness. Mid level echoes against the lateral wall is attributed to adherent sludge given there is no internal color Doppler flow. There also admixed stones which are seen and calcified by prior CT. Common bile duct: Diameter: 5 mm Liver: No focal lesion identified. Within normal limits in parenchymal echogenicity. Portal vein is patent on color Doppler imaging with normal direction of blood flow towards the liver. IMPRESSION: Stones and sludge within the dilated gallbladder but no wall thickening or focal tenderness typical of acute cholecystitis. Electronically Signed   By: Monte Fantasia M.D.   On: 12/19/2018 07:50    Cardiac Studies   2D Echo 08/2018: - Left ventricle: The cavity size was normal. There was mild concentric hypertrophy. Systolic function was normal. The estimated ejection fraction was in the range of 55% to 60%. Wall motion was normal; there were no regional wall motion abnormalities. Doppler parameters are consistent with abnormal left ventricular relaxation (grade 1 diastolic dysfunction). - Aortic valve: There was trivial regurgitation. - Left atrium: The atrium was normal in size. - Right ventricle: Systolic function was normal. - Pulmonary arteries: Systolic pressure could not be accurately estimated. __________  Myoview 09/2017: Pharmacological myocardial perfusion imaging study with no significant ischemia Normal wall moion, EF estimated at 69% No EKG changes concerning for ischemia at peak  stress or in recovery. Low risk scan  Patient Profile     85 y.o. male with CAD status post LAD and OM 2 PCI, paroxysmal atrial fibrillation, pulmonary embolism, cold agglutinin hemolytic anemia, prostate cancer, and hyperlipidemia here with orthostatic hypotension in the setting of severe autonomic dysfunction.  Assessment & Plan    # Orthostatic hypotension: # Autonomic dysfunction: Multifactorial in the setting of profound weight loss and poor oral intake.  This is exacerbated by the fact that he has nausea and abdominal discomfort with eating.  Symptoms persist despite fludrocortisone, midodrine,  Mestinon, and Northera.  He continues to have symptoms.  He complains of nausea with eating as well as intermittent left lower quadrant abdominal pain.  He has a known history of diverticulosis but no diverticulitis.  Right upper quadrant ultrasound showed cholelithiasis but no cholecystitis.  Prior abdominal CT without contrast showed atherosclerosis and abdominal vessels.  It is possible that he could have mesenteric ischemia.  This would be exacerbated by hypotension and increased vagal tone with eating.  I discussed getting a abdominal CT-a with him.  He is concerned due to his renal function.  We discussed the fact that his renal function has returned to baseline and that I think it should be okay.  Will defer to primary team whether they would prefer CT-a or MRI-a of the abdomen without oral contrast. Dr. Rockey Situ recommended abdominal binder, thigh high Ted hose and eating in chair instead of bed.  None of these are occurring.  Will order.  # Weight loss: # Abdominal discomfort: CT-A/MR-A of abdomen to evaluate for chronic mesenteric ischemia as above.  # CAD s/p PCI: Not an active issue.  Continue Xarelto and ranolazine.   # PAF:  He remains in sinus rhythm.  Continue Xarelto.      For questions or updates, please contact [place] Please consult [email or web address] for contact info under         Signed, Skeet Latch, MD  12/19/2018, 2:19 PM

## 2018-12-19 NOTE — Progress Notes (Signed)
William Taylor at [place] NAME: William Taylor    MR#:  [id number]  DATE OF BIRTH:  08-Dec-1929  SUBJECTIVE:  CHIEF COMPLAINT:   Chief Complaint  Patient presents with  . Hypotension    orthostatic  Patient seen and evaluated today No complaints of dizziness this morning No complaints of chest pain No shortness of breath  REVIEW OF SYSTEMS:    ROS  CONSTITUTIONAL: No documented fever. No fatigue, weakness. No weight gain, no weight loss.  EYES: No blurry or double vision.  ENT: No tinnitus. No postnasal drip. No redness of the oropharynx.  RESPIRATORY: No cough, no wheeze, no hemoptysis. No dyspnea.  CARDIOVASCULAR: No chest pain. No orthopnea. No palpitations. No syncope.  GASTROINTESTINAL: No nausea, no vomiting or diarrhea. No abdominal pain. No melena or hematochezia.  GENITOURINARY: No dysuria or hematuria.  ENDOCRINE: No polyuria or nocturia. No heat or cold intolerance.  HEMATOLOGY: No anemia. No bruising. No bleeding.  INTEGUMENTARY: No rashes. No lesions.  MUSCULOSKELETAL: No arthritis. No swelling. No gout.  NEUROLOGIC: No numbness, tingling, or ataxia. No seizure-type activity.  PSYCHIATRIC: No anxiety. No insomnia. No ADD.   DRUG ALLERGIES:   Allergies  Allergen Reactions  . Bee Venom Swelling  . Sulfasalazine Hives and Swelling  . Sulfa Antibiotics Hives and Swelling  . Warfarin And Related Other (See Comments)    Chest pain    VITALS:  Blood pressure 112/82, pulse 80, temperature 98.5 F (36.9 C), temperature source Oral, resp. rate 18, height 6\' 4"  (1.93 m), weight 83.1 kg, SpO2 98 %.  PHYSICAL EXAMINATION:   Physical Exam  GENERAL:  85 y.o.-year-old patient lying in the bed with no acute distress.  EYES: Pupils equal, round, reactive to light and accommodation. No scleral icterus. Extraocular muscles intact.  HEENT: Head atraumatic, normocephalic. Oropharynx and nasopharynx clear.  NECK:  Supple, no jugular  venous distention. No thyroid enlargement, no tenderness.  LUNGS: Normal breath sounds bilaterally, no wheezing, rales, rhonchi. No use of accessory muscles of respiration.  CARDIOVASCULAR: S1, S2 normal. No murmurs, rubs, or gallops.  ABDOMEN: Soft, nontender, nondistended. Bowel sounds present. No organomegaly or mass.  EXTREMITIES: No cyanosis, clubbing or edema b/l.    NEUROLOGIC: Cranial nerves II through XII are intact. No focal Motor or sensory deficits b/l.   PSYCHIATRIC: The patient is alert and oriented x 3.  SKIN: No obvious rash, lesion, or ulcer.   LABORATORY PANEL:   CBC Recent Labs  Lab 12/19/18 0617  WBC 7.5  HGB 10.4*  HCT 29.0*  PLT 280   ------------------------------------------------------------------------------------------------------------------ Chemistries  Recent Labs  Lab 12/19/18 0430  NA 135  K 4.7  CL 101  CO2 26  GLUCOSE 99  BUN 12  CREATININE 0.61  CALCIUM 8.8*  AST 37  ALT 13  ALKPHOS 28*  BILITOT 2.3*   ------------------------------------------------------------------------------------------------------------------  Cardiac Enzymes Recent Labs  Lab 12/15/18 1022  TROPONINI <0.03   ------------------------------------------------------------------------------------------------------------------  RADIOLOGY:  US Abdomen Limited Ruq  Result Date: 12/19/2018 CLINICAL DATA:  Abdominal pain EXAM: ULTRASOUND ABDOMEN LIMITED RIGHT UPPER QUADRANT COMPARISON:  09/05/2018 abdominal CT FINDINGS: Gallbladder: Fall/distended gallbladder but no generalized wall thickening or focal tenderness. Mid level echoes against the lateral wall is attributed to adherent sludge given there is no internal color Doppler flow. There also admixed stones which are seen and calcified by prior CT. Common bile duct: Diameter: 5 mm Liver: No focal lesion identified. Within normal limits in parenchymal echogenicity. Portal vein is  patent on color Doppler imaging with  normal direction of blood flow towards the liver. IMPRESSION: Stones and sludge within the dilated gallbladder but no wall thickening or focal tenderness typical of acute cholecystitis. Electronically Signed   By: Monte Fantasia M.D.   On: 12/19/2018 07:50     ASSESSMENT AND PLAN:   85 year old elderly male patient with history of prostate cancer, coronary disease, hyperlipidemia currently under hospitalist service for orthostatic hypotension and weakness.  -Orthostatic hypotension Improved Patient currently on oral Florinef and midodrine Appreciate cardiology follow-up Tamsulosin on hold Fenofibrate also on hold Dose of Florinef has been increased Currently continuing midodrine pyridostigmine and droxidopa Cosyntropin stimulation test and ACTH level are normal Blood pressure has improved Follow-up abdominal ultrasound Abdominal ultrasound showed a dilated gallbladder with sludge and stones but no evidence of cholecystitis  -Atrial fibrillation Continue anticoagulation with Xarelto Amiodarone dose has been decreased  -GERD Continue proton pump inhibitor  -Hyperlipidemia Continue Pravachol  -Dysphagia  -Anemia Status post 1 unit PRBC transfusion Extensive work-up was done by GI in the recent past No further work-up Maintain hemoglobin around 10  -Disposition based on cardiology recommendation Swallow study  All the records are reviewed and case discussed with Care Management/Social Worker. Management plans discussed with the patient, family and they are in agreement.  CODE STATUS: Full code  DVT Prophylaxis: SCDs  TOTAL TIME TAKING CARE OF THIS PATIENT: 37 minutes.   POSSIBLE D/C IN 2 to 3 DAYS, DEPENDING ON CLINICAL CONDITION.  Saundra Shelling M.D on 12/19/2018 at 12:06 PM  Between 7am to 6pm - Pager - [phone]  After 6pm go to [email or web address] - password [email or web address] [place] [facility]  Office  [phone]  CC: Primary care physician; [name], MD  Note: This dictation was prepared with Dragon dictation along with smaller phrase technology. Any transcriptional errors that result from this process are unintentional.

## 2018-12-19 NOTE — Progress Notes (Signed)
Pt states would like to take synthroid after his abdominal ultrasound. Will notify incoming nurse. Will continue to monitor.

## 2018-12-19 NOTE — Plan of Care (Signed)
  Problem: Safety: Goal: Ability to remain free from injury will improve Outcome: Progressing   Problem: Education: Goal: Knowledge of General Education information will improve Description Including pain rating scale, medication(s)/side effects and non-pharmacologic comfort measures Outcome: Progressing   

## 2018-12-20 LAB — VITAMIN B1: Vitamin B1 (Thiamine): 85.8 nmol/L (ref 66.5–200.0)

## 2018-12-20 MED ORDER — AMIODARONE HCL 100 MG PO TABS: 100.0000 mg | ORAL_TABLET | Freq: Every day | ORAL | 0 refills | Status: DC

## 2018-12-20 MED ORDER — VITAMIN D (ERGOCALCIFEROL) 1.25 MG (50000 UNIT) PO CAPS: 50000.0000 [IU] | ORAL_CAPSULE | ORAL | 1 refills | Status: AC

## 2018-12-20 MED ORDER — PYRIDOSTIGMINE BROMIDE 60 MG PO TABS: 60.0000 mg | ORAL_TABLET | ORAL | 0 refills | Status: DC

## 2018-12-20 MED ORDER — FLUDROCORTISONE ACETATE 0.1 MG PO TABS: 0.2000 mg | ORAL_TABLET | Freq: Every day | ORAL | 0 refills | Status: AC

## 2018-12-20 MED ORDER — DROXIDOPA 100 MG PO CAPS: 400.0000 mg | ORAL_CAPSULE | Freq: Three times a day (TID) | ORAL | 0 refills | Status: DC

## 2018-12-20 NOTE — TOC Transition Note (Signed)
Transition of Care [facility]) - CM/SW Discharge Note   Patient Details  Name: William Taylor MRN: [id number] Date of Birth: 09/08/1929  [facility] CM/SW Contact:  Latanya Maudlin, RN Phone Number: 12/20/2018, 10:40 AM   Clinical Narrative:  Previous RNCM had consulted regarding needs for DME hospital and home health patient refused at that time and continues to refuse. Patient tells me he has a safe discharge plan and wishes to go home with family.     Final next level of care: Home/Self Care Barriers to Discharge: No Barriers Identified   Patient Goals and CMS Choice        Discharge Placement                       Discharge Plan and Services   Discharge Planning Services: CM Consult                      HH Arranged: Patient Refused [facility]          Social Determinants of Health (SDOH) Interventions     Readmission Risk Interventions Readmission Risk Prevention Plan 12/20/2018 12/16/2018  Transportation Screening Complete Complete  PCP or Specialist Appt within 3-5 Days Complete Not Complete  Not Complete comments - No appt. as of yet; will make at [place] or [place] Patient refused Complete  Social Work Consult for [place] Planning/Counseling Patient refused Complete  Palliative Care Screening Complete Not Applicable  Medication Review (RN Care Manager) Complete Complete  Some recent data might be hidden

## 2018-12-20 NOTE — Discharge Summary (Signed)
[facility] NAME: William Taylor    MR#:  [id number]  DATE OF BIRTH:  15-Jan-1930  DATE OF ADMISSION:  12/15/2018 ADMITTING PHYSICIAN: Dustin Flock, MD  DATE OF DISCHARGE: 12/20/2018  PRIMARY CARE PHYSICIAN: [name], MD   ADMISSION DIAGNOSIS:  Orthostatic hypotension [I95.1] Anemia, unspecified type [D64.9]  DISCHARGE DIAGNOSIS:  Orthostatic hypotension Autonomic dysfunction Chronic atrial fibrillation Anemia Benign prostate hypertrophy  SECONDARY DIAGNOSIS:   Past Medical History:  Diagnosis Date  . Cancer [facility])    Prostate, followed by Dr. Jacqlyn Larsen  . Colon polyp   . Coronary artery disease   . Hyperlipidemia   . Hypertension   . MI (myocardial infarction) ([place])    2015  . PAF (paroxysmal atrial fibrillation) ([place])    a. on xarelto  . Skin cancer      ADMITTING HISTORY William Taylor  is a 85 y.o. male with a known history of prostate cancer, coronary artery disease, hyperlipidemia, hypertension, coronary artery disease, paroxysmal atrial fibrillation who is presenting to the emergency room with complaint of dizziness and orthostatic hypotension.  Patient states that his been feeling weak for for long period of time and with progressively gotten worse.  And apparently was seen by his primary care doctor and had a drop in his blood pressure therefore he was told to come to the emergency room.  HOSPITAL COURSE:  Patient was admitted to telemetry.  Started on oral midodrine.  His blood pressure meds were held.  Was evaluated by [facility] health cardiology.  Patient tamsulosin was also stopped.. Based on cardiology recommendations fenofibrate was also stopped.  Patient was started on oral Florinef and the dose was increased.  He had cosyntropin stimulation test and ACTH levels checked which were normal.  Blood pressure improved with oral midodrine and Florinef.  Orthostatic hypotension resolved.  Patient continued oral  Xarelto for anticoagulation.  Patient's oral amiodarone dose has been decreased.  For anemia patient has been worked up extensively by GI in the past.  Outpatient follow-up.  Patient advised to wear abdominal binder thigh-high TED hoses by cardiology.  He was worked up with abdominal ultrasound which showed gallbladder sludge.  He will need MRA abdomen as an outpatient and follow-up with cardiology in the clinic.  Patient will be discharged home with home health services.  Case was discussed with cardiology and patient will be discharged home.  CONSULTS OBTAINED:  Treatment Team:  Nelva Bush, MD Leotis Pain, MD  DRUG ALLERGIES:   Allergies  Allergen Reactions  . Bee Venom Swelling  . Sulfasalazine Hives and Swelling  . Sulfa Antibiotics Hives and Swelling  . Warfarin And Related Other (See Comments)    Chest pain    DISCHARGE MEDICATIONS:   Allergies as of 12/20/2018      Reactions   Bee Venom Swelling   Sulfasalazine Hives, Swelling   Sulfa Antibiotics Hives, Swelling   Warfarin And Related Other (See Comments)   Chest pain      Medication List    STOP taking these medications   fenofibrate micronized 130 MG capsule Commonly known as:  ANTARA   potassium chloride 10 MEQ tablet Commonly known as:  K-DUR   pyridostigmine 180 MG CR tablet Commonly known as:  MESTINON Replaced by:  pyridostigmine 60 MG tablet   tamsulosin 0.4 MG Caps capsule Commonly known as:  FLOMAX     TAKE these medications   amiodarone 100 MG tablet Commonly known as:  PACERONE  Take 1 tablet (100 mg total) by mouth daily for 30 days. What changed:    medication strength  how much to take   Droxidopa 100 MG Caps Commonly known as:  Northera Take 400 mg by mouth 3 (three) times daily for 30 days.   feeding supplement (ENSURE ENLIVE) Liqd Take 237 mLs by mouth daily.   finasteride 5 MG tablet Commonly known as:  PROSCAR Take 1 tablet (5 mg total) by mouth daily.    fludrocortisone 0.1 MG tablet Commonly known as:  FLORINEF Take 2 tablets (0.2 mg total) by mouth daily for 30 days. What changed:  how much to take   levobunolol 0.5 % ophthalmic solution Commonly known as:  BETAGAN Place 1 drop into the right eye 2 (two) times daily.   levothyroxine 50 MCG tablet Commonly known as:  SYNTHROID Take 1 tablet (50 mcg total) by mouth daily at 6 (six) AM.   midodrine 10 MG tablet Commonly known as:  PROAMATINE Take 1 tablet (10 mg total) by mouth 3 (three) times daily. What changed:  Another medication with the same name was removed. Continue taking this medication, and follow the directions you see here.   nitroGLYCERIN 0.4 MG SL tablet Commonly known as:  NITROSTAT DISSOLVE UNDER THE TONGUE 1 TABLET EVERY 5 MINUTES AS NEEDED FOR CHEST PAIN   ondansetron 8 MG disintegrating tablet Commonly known as:  Zofran ODT Take 0.5 tablets (4 mg total) by mouth every 8 (eight) hours as needed for nausea.   pantoprazole 40 MG tablet Commonly known as:  PROTONIX Take 1 tablet (40 mg total) by mouth 2 (two) times daily.   pravastatin 40 MG tablet Commonly known as:  PRAVACHOL TAKE 1 TABLET BY MOUTH AT  BEDTIME   pyridostigmine 60 MG tablet Commonly known as:  MESTINON Take 1 tablet (60 mg total) by mouth 2 (two) times daily at 8 am and 4 pm for 30 days. Replaces:  pyridostigmine 180 MG CR tablet   QC Natura-LAX 17 GM/SCOOP powder Generic drug:  polyethylene glycol powder MIX AND TAKE 17 GM (LINE ON INSIDE OF LID) TWICE DAILY AS NEEDED   ranolazine 500 MG 12 hr tablet Commonly known as:  Ranexa Take 1 tablet (500 mg total) by mouth 2 (two) times daily.   Vitamin D (Ergocalciferol) 1.25 MG (50000 UT) Caps capsule Commonly known as:  DRISDOL Take 1 capsule (50,000 Units total) by mouth every 7 (seven) days.   Xarelto 20 MG Tabs tablet Generic drug:  rivaroxaban TAKE 1 TABLET BY MOUTH  DAILY       Today  Patient seen and evaluated  today Tolerating diet well No hypotension No dizziness No epigastric pain Hemodynamically stable  VITAL SIGNS:  Blood pressure (!) 138/93, pulse 67, temperature 98.3 F (36.8 C), temperature source Oral, resp. rate 18, height 6\' 4"  (1.93 m), weight 82 kg, SpO2 99 %.  I/O:    Intake/Output Summary (Last 24 hours) at 12/20/2018 1106 Last data filed at 12/20/2018 0424 Gross per 24 hour  Intake -  Output 351 ml  Net -351 ml    PHYSICAL EXAMINATION:  Physical Exam  GENERAL:  85 y.o.-year-old patient lying in the bed with no acute distress.  LUNGS: Normal breath sounds bilaterally, no wheezing, rales,rhonchi or crepitation. No use of accessory muscles of respiration.  CARDIOVASCULAR: S1, S2 normal. No murmurs, rubs, or gallops.  ABDOMEN: Soft, non-tender, non-distended. Bowel sounds present. No organomegaly or mass.  NEUROLOGIC: Moves all 4 extremities. PSYCHIATRIC: The patient  is alert and oriented x 3.  SKIN: No obvious rash, lesion, or ulcer.   DATA REVIEW:   CBC Recent Labs  Lab 12/19/18 0617  WBC 7.5  HGB 10.4*  HCT 29.0*  PLT 280    Chemistries  Recent Labs  Lab 12/19/18 0430  NA 135  K 4.7  CL 101  CO2 26  GLUCOSE 99  BUN 12  CREATININE 0.61  CALCIUM 8.8*  AST 37  ALT 13  ALKPHOS 28*  BILITOT 2.3*    Cardiac Enzymes Recent Labs  Lab 12/15/18 1022  TROPONINI <0.03    Microbiology Results  Results for orders placed or performed in visit on 07/05/16  Fecal occult blood, imunochemical     Status: Abnormal   Collection Time: 07/05/16  4:40 PM  Result Value Ref Range Status   Fecal Occult Bld Positive (A) Negative Final    RADIOLOGY:  US Abdomen Limited Ruq  Result Date: 12/19/2018 CLINICAL DATA:  Abdominal pain EXAM: ULTRASOUND ABDOMEN LIMITED RIGHT UPPER QUADRANT COMPARISON:  09/05/2018 abdominal CT FINDINGS: Gallbladder: Fall/distended gallbladder but no generalized wall thickening or focal tenderness. Mid level echoes against the lateral wall  is attributed to adherent sludge given there is no internal color Doppler flow. There also admixed stones which are seen and calcified by prior CT. Common bile duct: Diameter: 5 mm Liver: No focal lesion identified. Within normal limits in parenchymal echogenicity. Portal vein is patent on color Doppler imaging with normal direction of blood flow towards the liver. IMPRESSION: Stones and sludge within the dilated gallbladder but no wall thickening or focal tenderness typical of acute cholecystitis. Electronically Signed   By: Monte Fantasia M.D.   On: 12/19/2018 07:50    Follow up with PCP in 1 week.  Management plans discussed with the patient, family and they are in agreement.  CODE STATUS: Full code    Code Status Orders  (From admission, onward)         Start     Ordered   12/15/18 1646  Full code  Continuous     12/15/18 1645        Code Status History    Date Active Date Inactive Code Status Order ID Comments User Context   09/16/2018 0175 09/23/2018 1639 DNR [id number]  Vaughan Basta, MD Inpatient   03/07/2017 1810 03/09/2017 1638 Full Code [id number]  Lance Coon, MD Inpatient   07/06/2015 2225 07/07/2015 1914 Full Code [id number]  Hower, Aaron Mose, MD ED      TOTAL TIME TAKING CARE OF THIS PATIENT ON DAY OF DISCHARGE: more than 37 minutes.   Saundra Shelling M.D on 12/20/2018 at 11:06 AM  Between 7am to 6pm - Pager - [phone]  After 6pm go to [email or web address] - password [email or web address] [facility]  Office  [phone]  CC: Primary care physician; [name], MD  Note: This dictation was prepared with Dragon dictation along with smaller phrase technology. Any transcriptional errors that result from this process are unintentional.

## 2018-12-21 ENCOUNTER — Telehealth: Payer: Self-pay | Admitting: Family Medicine

## 2018-12-21 ENCOUNTER — Telehealth: Payer: Self-pay | Admitting: Cardiovascular Disease

## 2018-12-21 ENCOUNTER — Telehealth: Payer: Self-pay

## 2018-12-21 ENCOUNTER — Other Ambulatory Visit: Payer: Self-pay | Admitting: *Deleted

## 2018-12-21 DIAGNOSIS — R531 Weakness: Secondary | ICD-10-CM

## 2018-12-21 DIAGNOSIS — I951 Orthostatic hypotension: Secondary | ICD-10-CM

## 2018-12-21 LAB — HEPATITIS B CORE ANTIBODY, TOTAL: Hep B Core Total Ab: NEGATIVE

## 2018-12-21 LAB — HEPATITIS B SURFACE ANTIBODY,QUALITATIVE: Hep B S Ab: NONREACTIVE

## 2018-12-21 LAB — HEPATITIS B SURFACE ANTIGEN: Hepatitis B Surface Ag: NEGATIVE

## 2018-12-21 LAB — HEPATITIS C ANTIBODY: HCV Ab: <0.1 s/co ratio (ref 0.0–0.9)

## 2018-12-21 NOTE — Telephone Encounter (Signed)
Spoke with patients wife per release form and reviewed that forms have been faxed and they should be in touch with appointment once they review the information provided. She verbalized understanding with no further questions at this time.

## 2018-12-21 NOTE — Telephone Encounter (Signed)
Copied from [place] [id number]. Topic: Quick Communication - See Telephone Encounter >> Dec 21, 2018  4:10 PM Loma [place] wrote: CRM for notification. See Telephone encounter for: 12/21/18. Pt calling to sch a H FU visit from this weekend. Pt also says that the hospital told them that Dr Caryl Bis needed to Sch pt for a MRA since he was released over the weekend and the hospital was unable to sch. Please FU with pt and for any concerns  [phone] Call made after hrs

## 2018-12-21 NOTE — Telephone Encounter (Signed)
Patient wife calling States that she was advised to make office aware when patient was released from hospital Patient has been released and the rest of the records will need to be sent to [place] Please call to discuss

## 2018-12-21 NOTE — Telephone Encounter (Signed)
Copied from [place] [id number]. Topic: General - Inquiry >> Dec 21, 2018  3:50 PM Richardo Priest, NT wrote: Reason for CRM: Ms.Glover is calling in, care coordinator or [facility], and states wife gave her a call requesting patient have a bedside commode. States they use [facility] in [place]. Fax number is [phone].

## 2018-12-21 NOTE — Patient Outreach (Signed)
[facility]) Care Management  12/21/2018  William Taylor 28-Jan-1930 [id number]   Initial telephone outreach call  Transition of care by PCP   Referral source: [facility] case manager Referral reason: Complex care management services   Admission to [facility] 4/28-5/3, Dx : Dizziness, Orthostatic hypotension, Autonomic dysfunction , Anemia, chronic Anemia  Noted ED visits in last 6 months: 2 Noted Admission in last 6 months : 2  PMHX : includes but not limited to Atrial fib, Orthostatic hypotension, GI hemorrhage with melena.    Outreach call to patient , person answering phone identified as patient wife William Taylor, [place], she states patient is not able to talk in the phone at this time , HIPAA confirmed by 2 identifiers. Explained reason for the call and [facility] care management services.   Wife further discussed : Patient declining condition  since January of this year, she reports that patient has mostly been in the bed this year .   Autonomic disorder  Wife discussed patient condition of being unable to tolerate sitting up without having dizziness and drop in his blood pressure. Wife discussed being hopeful that patient can be seen at [facility] for disorder shared [place] office is working on arranging this . Hypotension  Wife reports that she usually checks patient  blood pressure,daily  lying sitting and standing , and sending a record to Dr. Rockey Situ office  but she has been so busy since patient has been discharged that she has not had a change to do that yet. She was also checking patient weight daily but has not started that yet. Encouraged patient to have family support available to assist as needed with standing initially.  Fall   Wife reports patient with one recent fall no injury ,states always uses a walker . Fall preventions measures reviewed.  Abdominal discomfort nausea/Nutrition .  Wife reports patient with 30 lbs weight loss in the last 4 months, due to  decreased intake, has a appetite but just can't eat.  Wife discussed trying supplement on ensure/ boost but patient has tried and just can't tolerate. She report trying to get patient to drink more but it is difficult, she fears he will get dehydrated  so she keeps trying.   Social  Patient lives at home with his spouse , he has support daughter and son in law that is able to assist at times.  Wife  discussed that patient with the help of family tolerated after discharge home, using walker to get into the home. She has managed to assist him to the bathroom once since being home other wise patient is in the bed and using the urinal. Discussed bedside commode wife reports they do not have one , but she will be able to get one. She discussed using [facility] in the past and will probably use them. Wife discussed the last week being tough due to in addition her spouse she is 4 for her  Sister that is currently in assisted living .   Patient started to say , she is going to need some help with him, but states the patient does not want anyone else in the home until after Covid 19 restrictions lifted.  Patient declined hospital bed as recommended while inpatient as part of discharge planning, wife states that they have access to a hospital bed , that she had purchased for her brother in law. Patient again states today that he is not ready for hospital bed yet, discussed benefit of  having bed that can elevate head, feet with blood pressure changes, wife reports that they have a wedge pillow for the head and tries to use for the feet, doesn't work well as patient sleeps mostly on his side.. Wife states that they have room in their bedroom for hospital bed.    Advanced Directives Patient has HCPOA, does not have living will, has completed living will  forms and has  at home but needs to be notarized.   Medications  Patient was recently discharged from hospital and all medications have been  reviewed. Patient wife discussed that she separates medications by time of day and places in baggies for patient , that works best for them now.  Patient wife voiced concern regarding medication Pyridostigmine states new instructions to take 60 mg twice daily, she picked up prescription at pharmacy while patient was inpatient for 180 mg tablet daily, she states giving patient 1/2 tablet this am, and has placed call to [place] office. Discussed with patient a [facility], wife states her understanding is that Pyridostigmine is a medication that has be sent to a speciality pharmacy as well as Nothera that she does have.   Appointments  Has scheduled telehealth visit with Dr.Gollan on 5/13 Wife agreeable to scheduling post discharge visit with Dr. Biagio Quint.  Consent  Explained and offered [facility] care management services for management of chronic conditions per wife/patient are  agreeable to services.    Plan Will send Dr. Biagio Quint barrier/involvement letter  Will send patient welcome letter and packet.  Will plan outreach call in the next week.  Placed call to PCP office to request order for [facility] per wife request .            Placed call to [place] pharmacist to discuss patient wife concern regarding prescription for Pyridostigmine, she [facility] with [facility] and states they will fill new prescription and Nothera is sent to speciality pharmacy. I have updated patient wife pharmacy to fill Pyridostigmine prescription at new dose prescribed at discharge.      THN CM Care Plan Problem One     Most Recent Value  Care Plan Problem One  High risk for hospital readmission related to recent discharge , due to orthostatic hypotension , GI discomfort   Role Documenting the Problem One  Care Management [place] for Problem One  Active  THN Long Term Goal   Patient will not experience a  hospital admission over the next 31 days   THN Long Term Goal Start Date  12/21/18  Interventions for Problem One Long Term Goal  Reviewed discharge instructions, discussed medication changes and importance of taking as prescribed, discussed [place] to follow up . Advised notifying MD of new concerns continued or increased weakness.    THN CM Short Term Goal #1   Over the next 30 days Patient will be able to report at least 3 lifestyle changes to include in daily care for management of hypotension   THN CM Short Term Goal #1 Start Date  12/21/18  Interventions for Short Term Goal #1  Advised regarding making sure to change positions slowly, , discussed drinking adequate fluids daily, 8 8 ounces of fluid   THN CM Short Term Goal #2   Over the next 30 days patient/wife will begin to monitor blood pressure readings,   THN CM Short Term Goal #2 Start Date  12/21/18  Interventions for Short Term Goal #2  Discussed with patient wife monitoring blood pressure readings and continuing to keep a record, reviewed how to monitor orthostatic reading , suggested having standby assist available. Reviewed patient usual reading and when to call MD of high and low readings .       Joylene Draft, RN, [place] Management Coordinator  [phone]- Mobile [phone]- Toll Free Main Office

## 2018-12-22 ENCOUNTER — Other Ambulatory Visit: Payer: Self-pay

## 2018-12-22 NOTE — Telephone Encounter (Signed)
I am happy to call and schedule pt. Wanted to run this by you first to check to see if this needs to be in office or virtual visit and if you would like to see him on a certain day or time.   Thanks

## 2018-12-22 NOTE — Telephone Encounter (Signed)
Please fax. Written prescription created.

## 2018-12-22 NOTE — Patient Outreach (Signed)
[facility]) Care Management  [place]   12/22/2018  William Taylor 12-21-1929 [id number]   Reason for referral: Post discharge medication review   PMHx: Orthostatic hypotension, autonomic dysfunction, chronic atrial fibrillation, anemia, benign prostate hypertrophy  Outreach: Called patient's wife, William Taylor, who is his primary care giver and HIPAA identifiers were confirmed. She reports the patient has had a better day today, without dry heaving and is in better spirits/ more like himself. However, yesterday, patient had significant dry heaving. He has dizziness upon sitting and standing that is unchanged from recent hospital visit and the patient has been having for years. Patient does attempt to mitigate this by sitting/standing more slowly. Ms. William Taylor has not weighed the patient since discharge but states he appears that he has lost even more weight, and he still cannot tolerate eating well. She plans to weigh him and take his blood pressure tonight.   Ms. William Taylor is the primary care giver to three adults, including her husband, her sister and her brother-in-law. Her brother-in-law passed away last 11-27-22 and her sister is currently hospitalized due to a fall since last week. She requests help with finding home health care help for [name]. She would appreciate help with his care, especially with bathing/grooming. She has been in contact with PT services who plan on doing home visits once COVID-19 restrictions are lifted.   The patient is in the process of setting up an out patient MRA. He has also been referred to [facility] to see an autonomic specialist.     Objective: Lab Results  Component Value Date   CREATININE 0.61 12/19/2018   CREATININE 0.67 12/18/2018   CREATININE 0.82 12/16/2018    Lab Results  Component Value Date   HGBA1C 5.5 09/16/2018    Lipid Panel     Component Value Date/Time   CHOL 110 06/28/2016 1106   CHOL 119 10/27/2013  0609   TRIG 80.0 06/28/2016 1106   TRIG 61 10/27/2013 0609   HDL 29.90 (L) 06/28/2016 1106   HDL 38 (L) 10/27/2013 0609   CHOLHDL 4 06/28/2016 1106   VLDL 16.0 06/28/2016 1106   VLDL 12 10/27/2013 0609   LDLCALC 64 06/28/2016 1106   LDLCALC 69 10/27/2013 0609    BP Readings from Last 3 Encounters:  12/20/18 (!) 138/93  11/18/18 121/79  10/23/18 137/89    Allergies  Allergen Reactions  . Bee Venom Swelling  . Sulfasalazine Hives and Swelling  . Sulfa Antibiotics Hives and Swelling  . Warfarin And Related Other (See Comments)    Chest pain    Medications Reviewed Today    Reviewed by Servando Salina, [facility] (Pharmacist) on 12/22/18 at 1556  Med List Status: <None>  Medication Order Taking? Sig Documenting Provider Last Dose Status Informant  amiodarone (PACERONE) 100 MG tablet [id number] Yes Take 1 tablet (100 mg total) by mouth daily for 30 days. Saundra Shelling, MD Taking Active   Droxidopa (NORTHERA) 100 MG CAPS [id number] Yes Take 400 mg by mouth 3 (three) times daily for 30 days. Saundra Shelling, MD Taking Active   feeding supplement, ENSURE ENLIVE, (ENSURE ENLIVE) LIQD [id number] No Take 237 mLs by mouth daily.  Patient not taking:  Reported on 12/21/2018   Salary, Avel Peace, MD Not Taking Active Self  finasteride (PROSCAR) 5 MG tablet [id number] Yes Take 1 tablet (5 mg total) by mouth daily. Abbie Sons, MD Taking Active Self  fludrocortisone (FLORINEF) 0.1 MG tablet [id number] Yes Take 2 tablets (  0.2 mg total) by mouth daily for 30 days. Saundra Shelling, MD Taking Active   levobunolol (BETAGAN) 0.5 % ophthalmic solution [id number] Yes Place 1 drop into the right eye 2 (two) times daily.  [provider] Taking Active Self  levothyroxine (SYNTHROID, LEVOTHROID) 50 MCG tablet [id number] Yes Take 1 tablet (50 mcg total) by mouth daily at 6 (six) AM. Salary, Avel Peace, MD Taking Active Self  midodrine (PROAMATINE) 10 MG tablet [id number] Yes Take 1 tablet (10 mg total) by mouth 3  (three) times daily. [name], MD Taking Active Self  nitroGLYCERIN (NITROSTAT) 0.4 MG SL tablet [id number]  DISSOLVE UNDER THE TONGUE 1 TABLET EVERY 5 MINUTES AS NEEDED FOR CHEST PAIN [name], MD  Active Self           Med Note Joylene Draft A   Mon Dec 21, 2018  3:02 PM) Has on hand   ondansetron (ZOFRAN-ODT) 8 MG disintegrating tablet [id number] Yes Take 0.5 tablets (4 mg total) by mouth every 8 (eight) hours as needed for nausea. Jodelle Green, FNP Taking Active Self  pantoprazole (PROTONIX) 40 MG tablet [id number] Yes Take 1 tablet (40 mg total) by mouth 2 (two) times daily. [name], MD Taking Active Self  pravastatin (PRAVACHOL) 40 MG tablet [id number] Yes TAKE 1 TABLET BY MOUTH AT  BEDTIME [name], MD Taking Active Self  pyridostigmine (MESTINON) 60 MG tablet [id number] Yes Take 1 tablet (60 mg total) by mouth 2 (two) times daily at 8 am and 4 pm for 30 days. Saundra Shelling, MD Taking Active   QC NATURA-LAX powder [id number] No MIX AND TAKE 17 GM (LINE ON INSIDE OF LID) TWICE DAILY AS NEEDED  Patient not taking:  Reported on 12/22/2018   [name], MD Not Taking Active Self  ranolazine (RANEXA) 500 MG 12 hr tablet [id number] Yes Take 1 tablet (500 mg total) by mouth 2 (two) times daily. [name], MD Taking Active Self  Vitamin D, Ergocalciferol, (DRISDOL) 1.25 MG (50000 UT) CAPS capsule [id number] Yes Take 1 capsule (50,000 Units total) by mouth every 7 (seven) days. Saundra Shelling, MD Taking Active            Med Note Jonathon Jordan Dec 21, 2018  3:07 PM) Takes on Saturday.   XARELTO 20 MG TABS tablet [id number] Yes TAKE 1 TABLET BY MOUTH  DAILY Gollan, Kathlene November, MD Taking Active Self          ASSESSMENT: Date Discharged from Hospital: 12/20/2018 Date Medication Reconciliation Performed: 12/22/2018  Medications:  Adjustments at Discharge: . Pyridostigmine 60mg  tablet (from 180mg )   Discontinued at Discharge:    Fenofibrate  Potassium   Tamsulosin     Drugs sorted by system:  Neurologic/Psychologic: pyridostigmine   Cardiovascular: amiodarone, droxidopa, midodrine, ranolazine, NitroStat, pravastatin, Xarelto  Gastrointestinal: ondansetron, pantoprazole, Natura-Lax   Endocrine: levothyroxine   Topical: Betagan eye gtt   Genitourinary: finasteride  Vitamins/Minerals/Supplements: Vitamin D   Medication Review Findings:  . Patient was instructed to take ondansetron scheduled 40 minutes before meals to see if helps with tolerability per wife. Patient is on amiodarone and most recent QTc 507. Ondansetron can cause QTc lengthening and leave patient at increased risk for arrhythmias.  . Patient taking 1/3 of pyridostigmine tablet. This tablet is CR and should not be split. [name] states will pick prescription soon.     Plan: - Drug interaction with ondansetron and amiodarone identified. Will route note to  PCP, Dr. Caryl Bis.  - Need for home health identified. Will route note to Landis Martins, RN.  Azzie Roup D PGY1 Pharmacy Resident  Phone [phone] Please use [facility] for clinical pharmacists numbers  12/22/2018      4:38 PM

## 2018-12-22 NOTE — Addendum Note (Signed)
Addended by: [name] on: 12/22/2018 05:12 PM   Modules accepted: Orders

## 2018-12-22 NOTE — Telephone Encounter (Signed)
Sent to PCP would you be the one to fax a written prescription for something like this?

## 2018-12-23 ENCOUNTER — Other Ambulatory Visit: Payer: Self-pay

## 2018-12-23 NOTE — Telephone Encounter (Signed)
Transition Care Management Follow-up Telephone Call  How have you been since you were released from the hospital? Patient wife Harlow Asa  Patient is still not eating good only a couple of bites at a time. Hospital request PCP to order MRA.   Do you understand why you were in the hospital? yes   Do you understand the discharge instrcutions? yes  Items Reviewed:  Medications reviewed: yes  Allergies reviewed: yes  Dietary changes reviewed: yes  Referrals reviewed: yes   Functional Questionnaire:   Activities of Daily Living (ADLs):   He states they are independent in the following: feeding, continence, grooming and toileting States they require assistance with the following: ambulation, bathing and hygiene and toileting   Any transportation issues/concerns?: no   Any patient concerns? Yes,  Patient wife says she could use help bathing and dressing him, she is suppose to weigh patient daily but he is not able to stand long enough to weigh. Patient would benefit from having bedside toilet and a wheelchair for a tall person the one he has is to short. Patient is to weak to use a walker.   Confirmed importance and date/time of follow-up visits scheduled: yes   Confirmed with patient if condition begins to worsen call PCP or go to the ER.  Patient was given the Call-a-Nurse line [phone]: yes

## 2018-12-23 NOTE — Addendum Note (Signed)
Addended by: Nanci Pina on: 12/23/2018 09:57 AM   Modules accepted: Orders

## 2018-12-23 NOTE — Telephone Encounter (Signed)
This has been faxed.

## 2018-12-24 ENCOUNTER — Inpatient Hospital Stay: Payer: Medicare HMO | Attending: Oncology | Admitting: Oncology

## 2018-12-24 ENCOUNTER — Encounter: Payer: Self-pay | Admitting: Oncology

## 2018-12-24 ENCOUNTER — Telehealth: Payer: Self-pay | Admitting: Family Medicine

## 2018-12-24 DIAGNOSIS — D591 Other autoimmune hemolytic anemias: Secondary | ICD-10-CM

## 2018-12-24 DIAGNOSIS — D5912 Cold autoimmune hemolytic anemia: Secondary | ICD-10-CM

## 2018-12-24 NOTE — Progress Notes (Signed)
I had spoke to patient's wife earlier in the week to make an appt. For pt. Today. She had mentioned to me about he is nauseated and everytime he eats he gets dry heaves. I suggested to her that she could give me zofran about 45 min before eating to see if that helps. Today the wife states that she did give it to him and it helped but last night she forgot to give it to him and he actually vomited. She was going to make sure she does not forget in the future. She states that he has been loosing wt for 1 year. He had GI  Work in somewhere in that time frame of loosing wt but did not find anything. Then later she said that he does have gallstones. He has BM about every 2-3 days and they do have miralax if needed

## 2018-12-24 NOTE — Telephone Encounter (Signed)
Plan to see patient for virtual visit on Friday.

## 2018-12-24 NOTE — Telephone Encounter (Signed)
Received message from Dr Janese Banks regarding the patient and called her to speak regarding the patient. She wanted to update me on the patients cold agglutinin anemia. She noted his hemoglobin dropped in the hospital and rebounded after a transfusion. She notes that she wants to check his lab work in another week or so and see where his hemoglobin is. If it has dropped below his baseline of 10 she is considering whether or not to start him on rituxin. She is weary of this given the current issues with COVID19 in the community and would only start this medication if absolutely needed. She also notes he has more pressing issues to be taken care of with regards to his hypotension and is still awaiting on an appointment with neurology at [facility]. She advised that she would keep me in the loop once his labs are completed. I advised that I have follow-up with him tomorrow and that we would check on the [facility] referral at that time. We will also check to see if he has home health as they may be able to draw his labs at home.

## 2018-12-25 ENCOUNTER — Ambulatory Visit (INDEPENDENT_AMBULATORY_CARE_PROVIDER_SITE_OTHER): Payer: Medicare HMO | Admitting: Family Medicine

## 2018-12-25 ENCOUNTER — Encounter: Payer: Self-pay | Admitting: Family Medicine

## 2018-12-25 ENCOUNTER — Other Ambulatory Visit: Payer: Self-pay

## 2018-12-25 DIAGNOSIS — I951 Orthostatic hypotension: Secondary | ICD-10-CM

## 2018-12-25 DIAGNOSIS — C61 Malignant neoplasm of prostate: Secondary | ICD-10-CM

## 2018-12-25 DIAGNOSIS — K551 Chronic vascular disorders of intestine: Secondary | ICD-10-CM | POA: Diagnosis not present

## 2018-12-25 DIAGNOSIS — R1012 Left upper quadrant pain: Secondary | ICD-10-CM

## 2018-12-25 NOTE — Progress Notes (Signed)
I connected with William Taylor on 12/25/18 at 10:30 AM EDT by video enabled telemedicine visit and verified that I am speaking with the correct person using two identifiers.   I discussed the limitations, risks, security and privacy concerns of performing an evaluation and management service by telemedicine and the availability of in-person appointments. I also discussed with the patient that there may be a patient responsible charge related to this service. The patient expressed understanding and agreed to proceed.  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 He also has chronic back pain,hypertension and stage III CKD hyperlipidemia and A. fib among other medical problems. He has been referred to [place] for evaluation and management of anemia. Recent CBC from 02/16/2018 showed white count of 6.1, H&H of 10.7/30.8 with an MCV of 99.9. He was found to have coldagglutinin inhis peripheral blood. TSH was within normal limits.B12 and folate were within normal limits.His hemoglobin was 10.7 about 4 months ago but about 1 year ago his hemoglobin was 12.9.  Patient reports ongoing fatigue over the last 1 year. His appetite is good and his weight has been stable. He denies any lumps or bumps anywhere or drenching night sweats. Patient does report ongoing left-sided groin pain. He did have an ultrasound which did not reveal any adenopathy or mass. Also reports chronic back pain but denies any other acute joint pain or joint swelling. No skin rash.  Results of blood work from 03/05/2018 were as follows: CBC showed white count of 6.5, H&H of 11.9/35 with an MCV of 101 and a  platelet count of 228. CMP was normal except for a mildly elevated creatinine of 1.3. LDH was normal at 122 reticulocyte count was low normal at 1.9. Multiple myeloma panel did not reveal any M protein serum immunofixation was normal. Free light chain showed mildly elevated kappa of 23.8 with a normal kappa lambda light chain ratio of 1.5. Haptoglobin was low at 21 cold agglutinin titer was positive at 1 is to 1024 Coombs test was positive for complement and negative for IgG smear review showed macrocytic anemia with rouleaux formation. Negative for schistocytes iron studies were within normal limits  CT chest abdomen and pelvis in November 2019 showed nonspecific 4 mm left lower lobe lung nodule. Prostatomegaly. No evidence of malignancy   Interval history: Patient was recently admitted for postural hypotension. At that time his hb was down to 8.5 and he received 1 unit of transfusion. Patient continues to have symptomatic postural hypotension and he is unable to stand for more than a few minutes. He spends most of his time in bed or sitting   Review of Systems  Constitutional: Positive for malaise/fatigue and weight loss. Negative for chills and fever.  HENT: Negative for congestion, ear discharge and nosebleeds.   Eyes: Negative for blurred vision.  Respiratory: Negative for cough, hemoptysis, sputum production, shortness of breath and wheezing.   Cardiovascular: Negative for chest pain, palpitations, orthopnea and claudication.  Gastrointestinal: Negative for abdominal pain, blood in stool, constipation, diarrhea, heartburn, melena, nausea and vomiting.  Genitourinary: Negative for dysuria, flank pain, frequency, hematuria and urgency.  Musculoskeletal: Negative for back pain, joint pain and myalgias.  Skin: Negative  for rash.  Neurological: Positive for dizziness. Negative for tingling, focal weakness, seizures, weakness and headaches.  Endo/Heme/Allergies: Does not bruise/bleed  easily.  Psychiatric/Behavioral: Negative for depression and suicidal ideas. The patient does not have insomnia.     Allergies  Allergen Reactions  . Bee Venom Swelling  . Sulfasalazine Hives and Swelling  . Sulfa Antibiotics Hives and Swelling  . Warfarin And Related Other (See Comments)    Chest pain    Past Medical History:  Diagnosis Date  . Cancer [facility])    Prostate, followed by Dr. Jacqlyn Larsen  . Colon polyp   . Coronary artery disease   . Gall stones   . Hyperlipidemia   . Hypertension   . MI (myocardial infarction) ([place])    2015  . PAF (paroxysmal atrial fibrillation) ([place])    a. on xarelto  . Skin cancer     Past Surgical History:  Procedure Laterality Date  . CARDIAC CATHETERIZATION  10/2013   [facility]  . CATARACT EXTRACTION  Oct. 3, 2012   right eye  . colonoscopy    . COLONOSCOPY W/ POLYPECTOMY  2015   Dr Rayann Heman  . COLONOSCOPY WITH PROPOFOL N/A 08/15/2016   Procedure: COLONOSCOPY WITH PROPOFOL;  Surgeon: [name], MD;  Location: [facility] ENDOSCOPY;  Service: Endoscopy;  Laterality: N/A;  . CORONARY ANGIOPLASTY  2009   2005; s/p stent  . ENTEROSCOPY N/A 09/22/2018   Procedure: ENTEROSCOPY;  Surgeon: Lin Landsman, MD;  Location: [place];  Service: Gastroenterology;  Laterality: N/A;  . ESOPHAGOGASTRODUODENOSCOPY (EGD) WITH PROPOFOL N/A 08/15/2016   Procedure: ESOPHAGOGASTRODUODENOSCOPY (EGD) WITH PROPOFOL;  Surgeon: [name], MD;  Location: [facility] ENDOSCOPY;  Service: Endoscopy;  Laterality: N/A;  . GIVENS CAPSULE STUDY N/A 09/26/2016   Procedure: GIVENS CAPSULE STUDY;  Surgeon: [name], MD;  Location: [facility] ENDOSCOPY;  Service: Endoscopy;  Laterality: N/A;  . HERNIA REPAIR  2013  . LUMBAR SPINE SURGERY    . UPPER GI ENDOSCOPY  Sept 2015   Dr Rayann Heman    Social History   Socioeconomic History  . Marital status: Married    Spouse name: Not on file  . Number of children: Not on file  . Years of education: Not on file  . Highest education level: Not on file   Occupational History  . Not on file  Social Needs  . Financial resource strain: Not on file  . Food insecurity:    Worry: Not on file    Inability: Not on file  . Transportation needs:    Medical: Not on file    Non-medical: Not on file  Tobacco Use  . Smoking status: Never Smoker  . Smokeless tobacco: Never Used  Substance and Sexual Activity  . Alcohol use: No  . Drug use: No  . Sexual activity: Not Currently  Lifestyle  . Physical activity:    Days per week: Not on file    Minutes per session: Not on file  . Stress: Not on file  Relationships  . Social connections:    Talks on phone: Not on file    Gets together: Not on file    Attends religious service: Not on file    Active member of club or organization: Not on file    Attends meetings of clubs or organizations: Not on file    Relationship status: Not on file  . Intimate partner violence:    Fear of current or ex partner: Not on file    Emotionally abused: Not on file  Physically abused: Not on file    Forced sexual activity: Not on file  Other Topics Concern  . Not on file  Social History Narrative  . Not on file    Family History  Problem Relation Age of Onset  . Stroke Mother   . Colon cancer Father   . Coronary artery disease Brother   . Other Brother 22       lymes dz     Current Outpatient Medications:  .  amiodarone (PACERONE) 100 MG tablet, Take 1 tablet (100 mg total) by mouth daily for 30 days., Disp: 30 tablet, Rfl: 0 .  Droxidopa (NORTHERA) 100 MG CAPS, Take 400 mg by mouth 3 (three) times daily for 30 days., Disp: 90 capsule, Rfl: 0 .  finasteride (PROSCAR) 5 MG tablet, Take 1 tablet (5 mg total) by mouth daily., Disp: 90 tablet, Rfl: 2 .  fludrocortisone (FLORINEF) 0.1 MG tablet, Take 2 tablets (0.2 mg total) by mouth daily for 30 days., Disp: 60 tablet, Rfl: 0 .  levobunolol (BETAGAN) 0.5 % ophthalmic solution, Place 1 drop into the right eye 2 (two) times daily. , Disp: , Rfl:  .   levothyroxine (SYNTHROID, LEVOTHROID) 50 MCG tablet, Take 1 tablet (50 mcg total) by mouth daily at 6 (six) AM., Disp: 90 tablet, Rfl: 0 .  midodrine (PROAMATINE) 10 MG tablet, Take 1 tablet (10 mg total) by mouth 3 (three) times daily., Disp: 90 tablet, Rfl: 3 .  nitroGLYCERIN (NITROSTAT) 0.4 MG SL tablet, DISSOLVE UNDER THE TONGUE 1 TABLET EVERY 5 MINUTES AS NEEDED FOR CHEST PAIN, Disp: 25 tablet, Rfl: 3 .  ondansetron (ZOFRAN-ODT) 8 MG disintegrating tablet, Take 0.5 tablets (4 mg total) by mouth every 8 (eight) hours as needed for nausea., Disp: 20 tablet, Rfl: 1 .  pantoprazole (PROTONIX) 40 MG tablet, Take 1 tablet (40 mg total) by mouth 2 (two) times daily., Disp: 60 tablet, Rfl: 2 .  pravastatin (PRAVACHOL) 40 MG tablet, TAKE 1 TABLET BY MOUTH AT  BEDTIME, Disp: 90 tablet, Rfl: 3 .  pyridostigmine (MESTINON) 60 MG tablet, Take 1 tablet (60 mg total) by mouth 2 (two) times daily at 8 am and 4 pm for 30 days., Disp: 60 tablet, Rfl: 0 .  ranolazine (RANEXA) 500 MG 12 hr tablet, Take 1 tablet (500 mg total) by mouth 2 (two) times daily., Disp: 180 tablet, Rfl: 3 .  Vitamin D, Ergocalciferol, (DRISDOL) 1.25 MG (50000 UT) CAPS capsule, Take 1 capsule (50,000 Units total) by mouth every 7 (seven) days., Disp: 4 capsule, Rfl: 1 .  XARELTO 20 MG TABS tablet, TAKE 1 TABLET BY MOUTH  DAILY, Disp: 90 tablet, Rfl: 0 .  QC NATURA-LAX powder, MIX AND TAKE 17 GM (LINE ON INSIDE OF LID) TWICE DAILY AS NEEDED, Disp: 238 g, Rfl: 0  US Abdomen Limited Ruq  Result Date: 12/19/2018 CLINICAL DATA:  Abdominal pain EXAM: ULTRASOUND ABDOMEN LIMITED RIGHT UPPER QUADRANT COMPARISON:  09/05/2018 abdominal CT FINDINGS: Gallbladder: Fall/distended gallbladder but no generalized wall thickening or focal tenderness. Mid level echoes against the lateral wall is attributed to adherent sludge given there is no internal color Doppler flow. There also admixed stones which are seen and calcified by prior CT. Common bile duct:  Diameter: 5 mm Liver: No focal lesion identified. Within normal limits in parenchymal echogenicity. Portal vein is patent on color Doppler imaging with normal direction of blood flow towards the liver. IMPRESSION: Stones and sludge within the dilated gallbladder but no wall thickening or focal  tenderness typical of acute cholecystitis. Electronically Signed   By: Monte Fantasia M.D.   On: 12/19/2018 07:50    No images are attached to the encounter.   CMP Latest Ref Rng & Units 12/19/2018  Glucose 70 - 99 mg/dL 99  BUN 8 - 23 mg/dL 12  Creatinine 0.61 - 1.24 mg/dL 0.61  Sodium 135 - 145 mmol/L 135  Potassium 3.5 - 5.1 mmol/L 4.7  Chloride 98 - 111 mmol/L 101  CO2 22 - 32 mmol/L 26  Calcium 8.9 - 10.3 mg/dL 8.8(L)  Total Protein 6.5 - 8.1 g/dL 5.7(L)  Total Bilirubin 0.3 - 1.2 mg/dL 2.3(H)  Alkaline Phos 38 - 126 U/L 28(L)  AST 15 - 41 U/L 37  ALT 0 - 44 U/L 13   CBC Latest Ref Rng & Units 12/19/2018  WBC 4.0 - 10.5 K/uL 7.5  Hemoglobin 13.0 - 17.0 g/dL 10.4(L)  Hematocrit 39.0 - 52.0 % 29.0(L)  Platelets 150 - 400 K/uL 280     Observation/objective: Patient appears fatigued. He is laying in bed.   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 This is a post hospital discharge visit  Patients hemoglobin has remained stable between 10-11 over last 1 year. It dropped down to 8.9 this hospitalization but back up to 10.9 after transfusion. He continues to have ongoing hemolysis without a significant drop in his hemoglobin.  His present qol is poor due to refractory postural hypotension and I do not think that his cold agglutinin anemia is related to this. Treating his cold agglutinin anemia is unlikely to improve his hypotension.   If this were to be treated he would need need single agent rituxan weekly X4. This carries a risk of neutropenia and given his age and frailty and ongoing covid pandemic, I am hesitant to give it unless his anemia is  worse.  Follow-up instructions: repeat cbc with diff/ cmp/ retic count/haptoglobin/ tryptase/ ckit mutation next week. If anemia is continuing to worsen, I will set him up to receive rituxan. I have communicated all this to his pcp Dr. Caryl Bis as well over the phone  I discussed the assessment and treatment plan with the patient. The patient was provided an opportunity to ask questions and all were answered. The patient agreed with the plan and demonstrated an understanding of the instructions.   The patient was advised to call back or seek an in-person evaluation if the symptoms worsen or if the condition fails to improve as anticipated.    Visit Diagnosis: 1. Cold agglutinin disease ([place])     Dr. Randa Evens, MD, MPH [facility] at [facility] [phone] 12/25/2018 3:50 PM

## 2018-12-25 NOTE — Progress Notes (Addendum)
Virtual Visit via video note  This visit type was conducted due to national recommendations for restrictions regarding the COVID-19 pandemic (e.g. social distancing).  This format is felt to be most appropriate for this patient at this time.  All issues noted in this document were discussed and addressed.  No physical exam was performed (except for noted visual exam findings with Video Visits).   I connected with Arabella Merles today at 11:00 AM EDT by a video enabled telemedicine application and verified that I am speaking with the correct person using two identifiers. Location patient: home Location provider: work Persons participating in the virtual visit: patient, provider, Dustan Hyams (wife)  I discussed the limitations, risks, security and privacy concerns of performing an evaluation and management service by telephone and the availability of in person appointments. I also discussed with the patient that there may be a patient responsible charge related to this service. The patient expressed understanding and agreed to proceed.  Reason for visit: Hospital follow-up  HPI: Patient was hospitalized from 12/15/2018-12/20/2018.  He was admitted for orthostatic hypotension and autonomic dysfunction as well as anemia.  He had been feeling weak for a long time and had progressively gotten worse.  His blood pressure dropped and he was sent to the emergency department.  He was evaluated by cardiology.  His Flomax was stopped.  His blood pressure medications were held.  His fenofibrate was stopped as well.  He was maintained on Midodrin, Florinef, northera, and Mestinon.  His orthostatic hypotension reportedly resolved though the patient reports that his symptoms have not improved at all.  He underwent a cosyntropin stimulation test and ACTH levels which were normal.  His amiodarone dose was decreased.  Neurology did evaluate him and was not convinced that this was a central dysautonomia/neurological  issue.  He did receive 1 unit of blood for his anemia and is being followed by hematology for that.  He has been evaluated by GI in the past for his anemia.  It was recommended that he would need an MRA abdomen by cardiology given plaque seen on prior imaging to rule out mesenteric ischemia.  The patient notes since being discharged he has continued to feel lightheaded.  He notes no vertiginous symptoms.  He does note some nausea.  He was having dry heaves and did vomit earlier this month though no recurrence since then.  No syncope.  He notes his legs both feel weak.  He has been in bed the whole time since getting home other than getting up to go to the bathroom.  He has trouble walking and standing related to this.  His wife has had to help him into the bathroom.  He has not gotten his bedside commode or wheelchair.  He does note intermittent abdominal pain that comes and goes.  He will wake up in the morning and it hurts.  It is in the center of his abdomen and travels to his left.  He notes no pain with eating.  They do not have home health.  They have not heard anything regarding neurology evaluation at [facility].  He does have follow-up with cardiology scheduled.  Blood pressures have ranged from 138-190/72-97.  His wife does report she is concerned that his blood pressure goes up when he lays down.  His appetite is lousy.  He is trying to drink plenty of fluids though does have nausea with it.  He notes he is urinating well off of the Flomax.  Discharge summary reviewed.  Medications reviewed.   ROS: See pertinent positives and negatives per HPI.  Past Medical History:  Diagnosis Date   Cancer [facility])    Prostate, followed by Dr. Jacqlyn Larsen   Colon polyp    Coronary artery disease    Gall stones    Hyperlipidemia    Hypertension    MI (myocardial infarction) ([place])    2015   PAF (paroxysmal atrial fibrillation) ([place])    a. on xarelto   Skin cancer     Past Surgical History:  Procedure  Laterality Date   C[facility]  [facility]   CATARACT EXTRACTION  Oct. 3, 2012   right eye   colonoscopy     COLONOSCOPY W/ POLYPECTOMY  2015   Dr Rayann Heman   COLONOSCOPY WITH PROPOFOL N/A 08/15/2016   Procedure: COLONOSCOPY WITH PROPOFOL;  Surgeon: [name], MD;  Location: [facility] ENDOSCOPY;  Service: Endoscopy;  Laterality: N/A;   CORONARY ANGIOPLASTY  2009   2005; s/p stent   ENTEROSCOPY N/A 09/22/2018   Procedure: ENTEROSCOPY;  Surgeon: Lin Landsman, MD;  Location[facility] ENDOSCOPY;  Service: Gastroenterology;  Laterality: N/A;   ESOPHAGOGASTRODUODENOSCOPY (EGD) WITH PROPOFOL N/A 08/15/2016   Procedure: ESOPHAGOGASTRODUODENOSCOPY (EGD) WITH PROPOFOL;  Surgeon: [name], MD;  Location: [facility] ENDOSCOPY;  Service: Endoscopy;  Laterality: N/A;   GIVENS CAPSULE STUDY N/A 09/26/2016   Procedure: GIVENS CAPSULE STUDY;  Surgeon: [name], MD;  Location: [facility] ENDOSCOPY;  Service: Endoscopy;  Laterality: N/A;   HERNIA REPAIR  2013   LUMBAR SPINE SURGERY     UPPER GI ENDOSCOPY  Sept 2015   Dr Rayann Heman    Family History  Problem Relation Age of Onset   Stroke Mother    Colon cancer Father    Coronary artery disease Brother    Other Brother 61       lymes dz    SOCIAL HX: Non-smoker.   Current Outpatient Medications:    amiodarone (PACERONE) 100 MG tablet, Take 1 tablet (100 mg total) by mouth daily for 30 days., Disp: 30 tablet, Rfl: 0   Droxidopa (NORTHERA) 100 MG CAPS, Take 400 mg by mouth 3 (three) times daily for 30 days., Disp: 90 capsule, Rfl: 0   finasteride (PROSCAR) 5 MG tablet, Take 1 tablet (5 mg total) by mouth daily., Disp: 90 tablet, Rfl: 2   fludrocortisone (FLORINEF) 0.1 MG tablet, Take 2 tablets (0.2 mg total) by mouth daily for 30 days., Disp: 60 tablet, Rfl: 0   levobunolol (BETAGAN) 0.5 % ophthalmic solution, Place 1 drop into the right eye 2 (two) times daily. , Disp: , Rfl:    levothyroxine (SYNTHROID, LEVOTHROID) 50 MCG tablet,  Take 1 tablet (50 mcg total) by mouth daily at 6 (six) AM., Disp: 90 tablet, Rfl: 0   midodrine (PROAMATINE) 10 MG tablet, Take 1 tablet (10 mg total) by mouth 3 (three) times daily., Disp: 90 tablet, Rfl: 3   nitroGLYCERIN (NITROSTAT) 0.4 MG SL tablet, DISSOLVE UNDER THE TONGUE 1 TABLET EVERY 5 MINUTES AS NEEDED FOR CHEST PAIN, Disp: 25 tablet, Rfl: 3   ondansetron (ZOFRAN-ODT) 8 MG disintegrating tablet, Take 0.5 tablets (4 mg total) by mouth every 8 (eight) hours as needed for nausea., Disp: 20 tablet, Rfl: 1   pantoprazole (PROTONIX) 40 MG tablet, Take 1 tablet (40 mg total) by mouth 2 (two) times daily., Disp: 60 tablet, Rfl: 2   pravastatin (PRAVACHOL) 40 MG tablet, TAKE 1 TABLET BY MOUTH AT  BEDTIME, Disp: 90 tablet, Rfl: 3  pyridostigmine (MESTINON) 60 MG tablet, Take 1 tablet (60 mg total) by mouth 2 (two) times daily at 8 am and 4 pm for 30 days., Disp: 60 tablet, Rfl: 0   QC NATURA-LAX powder, MIX AND TAKE 17 GM (LINE ON INSIDE OF LID) TWICE DAILY AS NEEDED, Disp: 238 g, Rfl: 0   ranolazine (RANEXA) 500 MG 12 hr tablet, Take 1 tablet (500 mg total) by mouth 2 (two) times daily., Disp: 180 tablet, Rfl: 3   Vitamin D, Ergocalciferol, (DRISDOL) 1.25 MG (50000 UT) CAPS capsule, Take 1 capsule (50,000 Units total) by mouth every 7 (seven) days., Disp: 4 capsule, Rfl: 1   XARELTO 20 MG TABS tablet, TAKE 1 TABLET BY MOUTH  DAILY, Disp: 90 tablet, Rfl: 0  EXAM:  VITALS per patient if applicable: None.  GENERAL: alert, oriented, appears well and in no acute distress  HEENT: atraumatic, conjunttiva clear, no obvious abnormalities on inspection of external nose and ears  NECK: normal movements of the head and neck  LUNGS: on inspection no signs of respiratory distress, breathing rate appears normal, no obvious gross SOB, gasping or wheezing  CV: no obvious cyanosis  MS: moves all visible extremities without noticeable abnormality  PSYCH/NEURO: pleasant and cooperative, no  obvious depression or anxiety, speech and thought processing grossly intact  ASSESSMENT AND PLAN:  Discussed the following assessment and plan:  Orthostatic hypotension - Plan: Ambulatory referral to [facility]  Left upper quadrant pain - Plan: CANCELED: MR MRA ABDOMEN W CONTRAST  Prostate cancer ([place])  Chronic vascular disorder of intestine ([place]) - Plan: CANCELED: MR MRA ABDOMEN W CONTRAST  Orthostatic hypotension Patient continues to be symptomatic.  It sounds as though his blood pressure may still be dropping at home.  He does need to see neurology at [facility] and I will have our referral coordinator follow-up on that.  That referral was placed by cardiology.  They will contact his cardiologist office regarding his blood pressure going up with laying down.  Home health will be ordered.  Clinical RN will follow-up on the patient's DME orders.  Encouraged adequate intake.  Abdominal pain No obvious cause at this time.  Prior imaging studies without any significant findings.  We will obtain an abdominal MRA as requested from the hospital.  Prostate cancer Urinary symptoms are stable off of Flomax.  He will monitor for symptoms.  CMA to contact patient to schedule follow-up in 1 to 2 months.   Social distancing precautions and sick precautions given regarding COVID-19.  I discussed the assessment and treatment plan with the patient. The patient was provided an opportunity to ask questions and all were answered. The patient agreed with the plan and demonstrated an understanding of the instructions.   The patient was advised to call back or seek an in-person evaluation if the symptoms worsen or if the condition fails to improve as anticipated.    Tommi Rumps, MD

## 2018-12-26 ENCOUNTER — Encounter: Payer: Self-pay | Admitting: Family Medicine

## 2018-12-26 ENCOUNTER — Telehealth: Payer: Self-pay | Admitting: Family Medicine

## 2018-12-26 NOTE — Assessment & Plan Note (Signed)
No obvious cause at this time.  Prior imaging studies without any significant findings.  We will obtain an abdominal MRA as requested from the hospital.

## 2018-12-26 NOTE — Telephone Encounter (Signed)
William Taylor, please contact the patient and find out if he has a pacemaker or if he has metal anywhere in his body.  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  I need to know these things for him to have his MR. please also get him scheduled for follow-up in 1 to 2 months.  Thanks.  Melissa, this patient had a neurology referral placed to [facility] by cardiology.  The patient has not heard anything regarding this.  Could you contact [place] neurology to check on the status of his appointment?

## 2018-12-26 NOTE — Assessment & Plan Note (Signed)
Urinary symptoms are stable off of Flomax.  He will monitor for symptoms.

## 2018-12-26 NOTE — Assessment & Plan Note (Addendum)
Patient continues to be symptomatic.  It sounds as though his blood pressure may still be dropping at home.  He does need to see neurology at [facility] and I will have our referral coordinator follow-up on that.  That referral was placed by cardiology.  They will contact his cardiologist office regarding his blood pressure going up with laying down.  Home health will be ordered.  Clinical RN will follow-up on the patient's DME orders.  Encouraged adequate intake.

## 2018-12-27 DIAGNOSIS — K828 Other specified diseases of gallbladder: Secondary | ICD-10-CM | POA: Diagnosis not present

## 2018-12-27 DIAGNOSIS — K5730 Diverticulosis of large intestine without perforation or abscess without bleeding: Secondary | ICD-10-CM | POA: Diagnosis not present

## 2018-12-27 DIAGNOSIS — I509 Heart failure, unspecified: Secondary | ICD-10-CM | POA: Diagnosis not present

## 2018-12-27 DIAGNOSIS — R1032 Left lower quadrant pain: Secondary | ICD-10-CM | POA: Diagnosis not present

## 2018-12-27 DIAGNOSIS — I5032 Chronic diastolic (congestive) heart failure: Secondary | ICD-10-CM | POA: Diagnosis not present

## 2018-12-27 DIAGNOSIS — R768 Other specified abnormal immunological findings in serum: Secondary | ICD-10-CM | POA: Diagnosis not present

## 2018-12-27 DIAGNOSIS — Z20828 Contact with and (suspected) exposure to other viral communicable diseases: Secondary | ICD-10-CM | POA: Diagnosis not present

## 2018-12-27 DIAGNOSIS — R627 Adult failure to thrive: Secondary | ICD-10-CM | POA: Diagnosis not present

## 2018-12-27 DIAGNOSIS — I252 Old myocardial infarction: Secondary | ICD-10-CM | POA: Diagnosis not present

## 2018-12-27 DIAGNOSIS — I110 Hypertensive heart disease with heart failure: Secondary | ICD-10-CM | POA: Diagnosis not present

## 2018-12-27 DIAGNOSIS — Z8546 Personal history of malignant neoplasm of prostate: Secondary | ICD-10-CM | POA: Diagnosis not present

## 2018-12-27 DIAGNOSIS — I480 Paroxysmal atrial fibrillation: Secondary | ICD-10-CM | POA: Diagnosis not present

## 2018-12-27 DIAGNOSIS — E43 Unspecified severe protein-calorie malnutrition: Secondary | ICD-10-CM | POA: Diagnosis not present

## 2018-12-27 DIAGNOSIS — D649 Anemia, unspecified: Secondary | ICD-10-CM | POA: Diagnosis not present

## 2018-12-27 DIAGNOSIS — R531 Weakness: Secondary | ICD-10-CM | POA: Diagnosis not present

## 2018-12-27 DIAGNOSIS — R1084 Generalized abdominal pain: Secondary | ICD-10-CM | POA: Diagnosis not present

## 2018-12-27 DIAGNOSIS — R918 Other nonspecific abnormal finding of lung field: Secondary | ICD-10-CM | POA: Diagnosis not present

## 2018-12-27 DIAGNOSIS — R112 Nausea with vomiting, unspecified: Secondary | ICD-10-CM | POA: Diagnosis not present

## 2018-12-27 DIAGNOSIS — I951 Orthostatic hypotension: Secondary | ICD-10-CM | POA: Diagnosis not present

## 2018-12-27 DIAGNOSIS — D591 Other autoimmune hemolytic anemias: Secondary | ICD-10-CM | POA: Diagnosis not present

## 2018-12-27 DIAGNOSIS — I2510 Atherosclerotic heart disease of native coronary artery without angina pectoris: Secondary | ICD-10-CM | POA: Diagnosis not present

## 2018-12-28 ENCOUNTER — Ambulatory Visit: Payer: Self-pay | Admitting: *Deleted

## 2018-12-28 ENCOUNTER — Other Ambulatory Visit: Payer: Self-pay | Admitting: *Deleted

## 2018-12-28 DIAGNOSIS — R1084 Generalized abdominal pain: Secondary | ICD-10-CM | POA: Diagnosis not present

## 2018-12-28 DIAGNOSIS — R112 Nausea with vomiting, unspecified: Secondary | ICD-10-CM | POA: Diagnosis not present

## 2018-12-28 DIAGNOSIS — E43 Unspecified severe protein-calorie malnutrition: Secondary | ICD-10-CM | POA: Diagnosis not present

## 2018-12-28 DIAGNOSIS — I509 Heart failure, unspecified: Secondary | ICD-10-CM | POA: Diagnosis not present

## 2018-12-28 DIAGNOSIS — R918 Other nonspecific abnormal finding of lung field: Secondary | ICD-10-CM | POA: Diagnosis not present

## 2018-12-28 MED ORDER — ACETAMINOPHEN 325 MG PO TABS
650.00 | ORAL_TABLET | ORAL | Status: DC
Start: ? — End: 2018-12-28

## 2018-12-28 MED ORDER — GENERIC EXTERNAL MEDICATION
Status: DC
Start: ? — End: 2018-12-28

## 2018-12-28 MED ORDER — MIDODRINE HCL 5 MG PO TABS
10.00 | ORAL_TABLET | ORAL | Status: DC
Start: 2018-12-28 — End: 2018-12-28

## 2018-12-28 MED ORDER — FLUDROCORTISONE ACETATE 0.1 MG PO TABS
.10 | ORAL_TABLET | ORAL | Status: DC
Start: 2019-01-04 — End: 2018-12-28

## 2018-12-28 MED ORDER — LEVOTHYROXINE SODIUM 50 MCG PO TABS
50.00 | ORAL_TABLET | ORAL | Status: DC
Start: 2019-01-04 — End: 2018-12-28

## 2018-12-28 MED ORDER — AMIODARONE HCL 200 MG PO TABS
100.00 | ORAL_TABLET | ORAL | Status: DC
Start: 2019-01-04 — End: 2018-12-28

## 2018-12-28 MED ORDER — FINASTERIDE 5 MG PO TABS
5.00 | ORAL_TABLET | ORAL | Status: DC
Start: 2019-01-04 — End: 2018-12-28

## 2018-12-28 MED ORDER — PANTOPRAZOLE SODIUM 40 MG PO TBEC
40.00 | DELAYED_RELEASE_TABLET | ORAL | Status: DC
Start: 2019-01-04 — End: 2018-12-28

## 2018-12-28 MED ORDER — MELATONIN 3 MG PO TBDP
3.00 | ORAL_TABLET | ORAL | Status: DC
Start: ? — End: 2018-12-28

## 2018-12-28 MED ORDER — PRAVASTATIN SODIUM 20 MG PO TABS
40.00 | ORAL_TABLET | ORAL | Status: DC
Start: 2019-01-03 — End: 2018-12-28

## 2018-12-28 MED ORDER — SENNOSIDES-DOCUSATE SODIUM 8.6-50 MG PO TABS
2.00 | ORAL_TABLET | ORAL | Status: DC
Start: 2019-01-04 — End: 2018-12-28

## 2018-12-28 MED ORDER — RIVAROXABAN 20 MG PO TABS
20.00 | ORAL_TABLET | ORAL | Status: DC
Start: 2019-01-04 — End: 2018-12-28

## 2018-12-28 MED ORDER — LIDOCAINE HCL (PF) 1 % IJ SOLN
0.50 | INTRAMUSCULAR | Status: DC
Start: ? — End: 2018-12-28

## 2018-12-28 MED ORDER — SENNOSIDES-DOCUSATE SODIUM 8.6-50 MG PO TABS
2.00 | ORAL_TABLET | ORAL | Status: DC
Start: ? — End: 2018-12-28

## 2018-12-28 MED ORDER — GENERIC EXTERNAL MEDICATION
1000.00 | Status: DC
Start: 2019-01-04 — End: 2018-12-28

## 2018-12-28 MED ORDER — POLYETHYLENE GLYCOL 3350 17 G PO PACK
17.00 | PACK | ORAL | Status: DC
Start: 2019-01-04 — End: 2018-12-28

## 2018-12-28 NOTE — Addendum Note (Signed)
Addended by: Caryl Bis Aleese Kamps G on: 12/28/2018 11:58 AM   Modules accepted: Orders

## 2018-12-28 NOTE — Telephone Encounter (Signed)
Sent to PCP as an FYI pt was admitted to [facility] last night  FYI

## 2018-12-28 NOTE — Telephone Encounter (Signed)
It appears he had a CT of his abdomen to evaluate the blood flow to his intestines so he does not need the MRA. This has been canceled.

## 2018-12-28 NOTE — Progress Notes (Signed)
Patient was admitted to [facility] since our visit occurred and he had a CT abdomen/pelvis to evaluate for ischemia. He does not need the MRA at this time. This order will be cancelled.

## 2018-12-28 NOTE — Telephone Encounter (Signed)
Pt was addiment to [facility] last night . MRI's were done last night per pt's wife. The referral was placed for him to see the autonomic specialist.   Pt does not have a pacemaker only metel is in his mouth bc of his denturist.  Sent to [facility] to check in on status of appt.

## 2018-12-28 NOTE — Patient Outreach (Signed)
[facility]) Care Management  12/28/2018  William Taylor Apr 17, 1930 [id number]   Care Coordination   [facility] care coordinator consulted and involved in patient care since recent hospital discharge from [facility] on 5/3. For scheduled follow up telephone visit on today, noted patient admission to [facility] on 5/10.   Plan Will plan continued [facility] care management follow up pending discharge disposition .    Joylene Draft, RN, [place] Management Coordinator  [phone]- Mobile [phone]- Toll Free Main Office

## 2018-12-29 DIAGNOSIS — I509 Heart failure, unspecified: Secondary | ICD-10-CM | POA: Diagnosis not present

## 2018-12-29 DIAGNOSIS — R1084 Generalized abdominal pain: Secondary | ICD-10-CM | POA: Diagnosis not present

## 2018-12-29 DIAGNOSIS — E43 Unspecified severe protein-calorie malnutrition: Secondary | ICD-10-CM | POA: Diagnosis not present

## 2018-12-29 DIAGNOSIS — R112 Nausea with vomiting, unspecified: Secondary | ICD-10-CM | POA: Diagnosis not present

## 2018-12-29 MED ORDER — GENERIC EXTERNAL MEDICATION
1.00 | Status: DC
Start: 2019-01-04 — End: 2018-12-29

## 2018-12-29 MED ORDER — GENERIC EXTERNAL MEDICATION
Status: DC
Start: ? — End: 2018-12-29

## 2018-12-29 MED ORDER — MIRTAZAPINE 15 MG PO TABS
15.00 | ORAL_TABLET | ORAL | Status: DC
Start: 2019-01-03 — End: 2018-12-29

## 2018-12-29 MED ORDER — FOLIC ACID 1 MG PO TABS
1.00 | ORAL_TABLET | ORAL | Status: DC
Start: 2019-01-04 — End: 2018-12-29

## 2018-12-29 MED ORDER — LEVOBUNOLOL HCL 0.5 % OP SOLN
1.00 | OPHTHALMIC | Status: DC
Start: 2019-01-04 — End: 2018-12-29

## 2018-12-29 NOTE — Progress Notes (Deleted)
Virtual Visit via Video Note   This visit type was conducted due to national recommendations for restrictions regarding the COVID-19 Pandemic (e.g. social distancing) in an effort to limit this patient's exposure and mitigate transmission in our community.  Due to her co-morbid illnesses, this patient is at least at moderate risk for complications without adequate follow up.  This format is felt to be most appropriate for this patient at this time.  All issues noted in this document were discussed and addressed.  A limited physical exam was performed with this format.  Please refer to the patient's chart for her consent to telehealth for [facility].    Date:  12/29/2018   ID:  William Taylor, DOB 1930-04-19, MRN [id number]  Patient Location:  [place] [address]   Provider location:   [place] [facility], [place] office  PCP:  [name], MD  Cardiologist:  Patsy Baltimore  Chief Complaint:  Dizzy, low blood pressures   History of Present Illness:    William Taylor is a 85 y.o. male who presents via audio/video conferencing for a telehealth visit today.   The patient does not symptoms concerning for COVID-19 infection (fever, chills, cough, or new SHORTNESS OF BREATH).   Patient has a past medical history of coronary artery disease, stent in 2005,  atrial fibrillation with RVR in the setting of UTI September 2011,  stent placement May 2012 to the OM 2,  PE following catheterization started on anticoagulation, atrial fibrillation in 2013,  Last atrial fibrillation July 2018  atypical chest pain July 2018 History of anemia who presents for follow-up of his coronary disease and atrial fibrillation  Recent admission to [facility] for orthostasis Start tarted on Mestinon  Following discharge admitted to [facility] with continued progressive generalized weakness, anorexia Had CT scan abdomen showing no ischemia Right upper quadrant skin  negative On initial evaluation at [facility] and midodrine were held and he was continued on Florinef     Sx comes and goes, Take florinef, Midodrine 10 in am,, 5 mg noon, 10 mg in the PM On Northera 600 TID  Periodic nausea, takes a pill Drinking, appetite ok (breakfast ok, sometimes misses lunch/small amount, not much dinner, may feel better after eating)  Now worried about high blood pressures when he lay supine They have a  couple pillows they are using No hospital bed that allows them to raise her head and drop the feet May have a recliner, does not sound like they are using it much  No blood pressures measurements made when he is nauseous  Still very debilitated Able to get up for periods of time and has to go lay down when he is tired or dizzy or not feeling well     Prior CV studies:   The following studies were reviewed today:    Past Medical History:  Diagnosis Date  . Cancer [facility])    Prostate, followed by Dr. Jacqlyn Larsen  . Colon polyp   . Coronary artery disease   . Gall stones   . Hyperlipidemia   . Hypertension   . MI (myocardial infarction) ([place])    2015  . PAF (paroxysmal atrial fibrillation) ([place])    a. on xarelto  . Skin cancer    Past Surgical History:  Procedure Laterality Date  . CARDIAC [facility]IZATION  [facility]   [facility]  . CATARACT EXTRACTION  Oct. 3, 2012   right eye  . colonoscopy    .  COLONOSCOPY W/ POLYPECTOMY  2015   Dr Rayann Heman  . COLONOSCOPY WITH PROPOFOL N/A 08/15/2016   Procedure: COLONOSCOPY WITH PROPOFOL;  Surgeon: [name], MD;  Location: [facility] ENDOSCOPY;  Service: Endoscopy;  Laterality: N/A;  . CORONARY ANGIOPLASTY  2009   2005; s/p stent  . ENTEROSCOPY N/A 09/22/2018   Procedure: ENTEROSCOPY;  Surgeon: Lin Landsman, MD;  Location: [place];  Service: Gastroenterology;  Laterality: N/A;  . ESOPHAGOGASTRODUODENOSCOPY (EGD) WITH PROPOFOL N/A 08/15/2016   Procedure: ESOPHAGOGASTRODUODENOSCOPY (EGD) WITH PROPOFOL;   Surgeon: [name], MD;  Location: [facility] ENDOSCOPY;  Service: Endoscopy;  Laterality: N/A;  . GIVENS CAPSULE STUDY N/A 09/26/2016   Procedure: GIVENS CAPSULE STUDY;  Surgeon: [name], MD;  Location: [facility] ENDOSCOPY;  Service: Endoscopy;  Laterality: N/A;  . HERNIA REPAIR  2013  . LUMBAR SPINE SURGERY    . UPPER GI ENDOSCOPY  Sept 2015   Dr Rayann Heman     No outpatient medications have been marked as taking for the 12/30/18 encounter (Appointment) with [name], MD.     Allergies:   Bee venom; Sulfasalazine; Sulfa antibiotics; and Warfarin and related   Social History   Tobacco Use  . Smoking status: Never Smoker  . Smokeless tobacco: Never Used  Substance Use Topics  . Alcohol use: No  . Drug use: No     Current Outpatient Medications on File Prior to Visit  Medication Sig Dispense Refill  . amiodarone (PACERONE) 100 MG tablet Take 1 tablet (100 mg total) by mouth daily for 30 days. 30 tablet 0  . Droxidopa (NORTHERA) 100 MG CAPS Take 400 mg by mouth 3 (three) times daily for 30 days. 90 capsule 0  . finasteride (PROSCAR) 5 MG tablet Take 1 tablet (5 mg total) by mouth daily. 90 tablet 2  . fludrocortisone (FLORINEF) 0.1 MG tablet Take 2 tablets (0.2 mg total) by mouth daily for 30 days. 60 tablet 0  . levobunolol (BETAGAN) 0.5 % ophthalmic solution Place 1 drop into the right eye 2 (two) times daily.     Marland Kitchen levothyroxine (SYNTHROID, LEVOTHROID) 50 MCG tablet Take 1 tablet (50 mcg total) by mouth daily at 6 (six) AM. 90 tablet 0  . midodrine (PROAMATINE) 10 MG tablet Take 1 tablet (10 mg total) by mouth 3 (three) times daily. 90 tablet 3  . nitroGLYCERIN (NITROSTAT) 0.4 MG SL tablet DISSOLVE UNDER THE TONGUE 1 TABLET EVERY 5 MINUTES AS NEEDED FOR CHEST PAIN 25 tablet 3  . ondansetron (ZOFRAN-ODT) 8 MG disintegrating tablet Take 0.5 tablets (4 mg total) by mouth every 8 (eight) hours as needed for nausea. 20 tablet 1  . pantoprazole (PROTONIX) 40 MG tablet Take 1 tablet (40 mg  total) by mouth 2 (two) times daily. 60 tablet 2  . pravastatin (PRAVACHOL) 40 MG tablet TAKE 1 TABLET BY MOUTH AT  BEDTIME 90 tablet 3  . pyridostigmine (MESTINON) 60 MG tablet Take 1 tablet (60 mg total) by mouth 2 (two) times daily at 8 am and 4 pm for 30 days. 60 tablet 0  . QC NATURA-LAX powder MIX AND TAKE 17 GM (LINE ON INSIDE OF LID) TWICE DAILY AS NEEDED 238 g 0  . ranolazine (RANEXA) 500 MG 12 hr tablet Take 1 tablet (500 mg total) by mouth 2 (two) times daily. 180 tablet 3  . Vitamin D, Ergocalciferol, (DRISDOL) 1.25 MG (50000 UT) CAPS capsule Take 1 capsule (50,000 Units total) by mouth every 7 (seven) days. 4 capsule 1  . XARELTO 20  MG TABS tablet TAKE 1 TABLET BY MOUTH  DAILY 90 tablet 0   No current facility-administered medications on file prior to visit.      Family Hx: The patient's family history includes Colon cancer in his father; Coronary artery disease in his brother; Other (age of onset: 71) in his brother; Stroke in his mother.  ROS:   Please see the history of present illness.    Review of Systems  Constitutional: Positive for malaise/fatigue.  Respiratory: Negative.   Cardiovascular: Negative.   Gastrointestinal: Negative.   Musculoskeletal: Negative.   Neurological: Positive for dizziness.  Psychiatric/Behavioral: Negative.   All other systems reviewed and are negative.   Labs/Other Tests and Data Reviewed:    Recent Labs: 09/23/2018: Magnesium 1.7 12/15/2018: TSH 1.116 12/19/2018: ALT 13; BUN 12; Creatinine, Ser 0.61; Hemoglobin 10.4; Platelets 280; Potassium 4.7; Sodium 135   Recent Lipid Panel Lab Results  Component Value Date/Time   CHOL 110 06/28/2016 11:06 AM   CHOL 119 10/27/2013 06:09 AM   TRIG 80.0 06/28/2016 11:06 AM   TRIG 61 10/27/2013 06:09 AM   HDL 29.90 (L) 06/28/2016 11:06 AM   HDL 38 (L) 10/27/2013 06:09 AM   CHOLHDL 4 06/28/2016 11:06 AM   LDLCALC 64 06/28/2016 11:06 AM   LDLCALC 69 10/27/2013 06:09 AM    Wt Readings from Last 3  Encounters:  12/20/18 180 lb 11.2 oz (82 kg)  11/18/18 190 lb (86.2 kg)  10/23/18 198 lb 6.4 oz (90 kg)     Exam:    Vital Signs: Vital signs may also be detailed in the HPI There were no vitals taken for this visit.  Wt Readings from Last 3 Encounters:  12/20/18 180 lb 11.2 oz (82 kg)  11/18/18 190 lb (86.2 kg)  10/23/18 198 lb 6.4 oz (90 kg)   Temp Readings from Last 3 Encounters:  12/20/18 98.3 F (36.8 C) (Oral)  10/23/18 98 F (36.7 C) (Tympanic)  09/30/18 97.7 F (36.5 C) (Oral)   BP Readings from Last 3 Encounters:  12/20/18 (!) 138/93  11/18/18 121/79  10/23/18 137/89   Pulse Readings from Last 3 Encounters:  12/20/18 67  11/18/18 90  10/23/18 82     Numerous blood pressure measurements noted from MyChart message April 3  10am     180/99 67   141/78 74   85/60 88  April 4  9:30am   188/95 65   125/68 73   91/59 84  April 5  6am      169/74 65    126/74 72   91/54 84  April 6  10am    184/102 66   144/79 69   78/49 89   April 7  6am      184/100 66   127/81 81  104/60 85  April 8  7:00am   177/88 66    144/76 73  108/72 81  Numbers came through his wife's account  Well nourished, well developed male in no acute distress. Constitutional:  oriented to person, place, and time. No distress.    ASSESSMENT & PLAN:    Severe orthostasis on the Northera with Florinef midodrine 10mg   3 times daily (He would like to take 5 mg at 6:30 AM and 5 mg 10 AM followed by 10 mg in the 1 PM and 10 mg before dinner pm) We have changed the timing of his midodrine based on his conversation today He will monitor orthostatics at home  Mixed hyperlipidemia Cholesterol  at goal, no further changes made Stable  Coronary artery disease involving native coronary artery of native heart without angina pectoris Recent stress test no ischemia, no further work-up needed Stable  Atrial  fibrillation, unspecified type ([place]) on Xarelto Continue low-dose amiodarone  Congestive heart failure, unspecified congestive heart failure chronicity, unspecified congestive heart failure type ([place]) Encouraged fluid intake given his orthostasis  Chronic kidney disease (CKD), stage III (moderate) Encouraged him to stay hydrated  Iron deficiency anemia, unspecified iron deficiency anemia type Encouraged increased nutrition as tolerated   COVID-19 Education: The signs and symptoms of COVID-19 were discussed with the patient and how to seek care for testing (follow up with PCP or arrange E-visit).  The importance of social distancing was discussed today.  Patient Risk:   After full review of this patients clinical status, I feel that they are at least moderate risk at this time.  Time:   Today, I have spent 25 minutes with the patient with telehealth technology discussing the cardiac and medical problems/diagnoses detailed above  , discussed orthostasis,  malnutrition  Medication Adjustments/Labs and Tests Ordered: Current medicines are reviewed at length with the patient today.  Concerns regarding medicines are outlined above.   Tests Ordered: No tests ordered   Medication Changes: No changes made   Disposition: Follow-up in 6 months   Signed, Ida Rogue, MD  12/29/2018 3:54 PM    [facility] [address], [place], [place] [address]

## 2018-12-30 ENCOUNTER — Other Ambulatory Visit: Payer: Self-pay | Admitting: Cardiovascular Disease

## 2018-12-30 ENCOUNTER — Telehealth: Payer: Medicare HMO | Admitting: Cardiovascular Disease

## 2018-12-30 DIAGNOSIS — R112 Nausea with vomiting, unspecified: Secondary | ICD-10-CM | POA: Diagnosis not present

## 2018-12-30 DIAGNOSIS — R1084 Generalized abdominal pain: Secondary | ICD-10-CM | POA: Diagnosis not present

## 2018-12-30 DIAGNOSIS — E43 Unspecified severe protein-calorie malnutrition: Secondary | ICD-10-CM | POA: Diagnosis not present

## 2018-12-30 DIAGNOSIS — I509 Heart failure, unspecified: Secondary | ICD-10-CM | POA: Diagnosis not present

## 2018-12-30 MED ORDER — GENERIC EXTERNAL MEDICATION
Status: DC
Start: ? — End: 2018-12-30

## 2018-12-30 MED ORDER — MELATONIN 3 MG PO TBDP
6.00 | ORAL_TABLET | ORAL | Status: DC
Start: 2019-01-03 — End: 2018-12-30

## 2018-12-30 MED ORDER — MIDODRINE HCL 5 MG PO TABS
10.00 | ORAL_TABLET | ORAL | Status: DC
Start: 2019-01-04 — End: 2018-12-30

## 2018-12-31 DIAGNOSIS — R112 Nausea with vomiting, unspecified: Secondary | ICD-10-CM | POA: Diagnosis not present

## 2018-12-31 DIAGNOSIS — I509 Heart failure, unspecified: Secondary | ICD-10-CM | POA: Diagnosis not present

## 2018-12-31 DIAGNOSIS — E43 Unspecified severe protein-calorie malnutrition: Secondary | ICD-10-CM | POA: Diagnosis not present

## 2018-12-31 DIAGNOSIS — R1084 Generalized abdominal pain: Secondary | ICD-10-CM | POA: Diagnosis not present

## 2018-12-31 MED ORDER — GENERIC EXTERNAL MEDICATION
Status: DC
Start: ? — End: 2018-12-31

## 2018-12-31 MED ORDER — DROXIDOPA 100 MG PO CAPS
100.00 | ORAL_CAPSULE | ORAL | Status: DC
Start: 2019-01-01 — End: 2018-12-31

## 2018-12-31 NOTE — Telephone Encounter (Signed)
Left message for patient to return call back. PEC may give information.  

## 2019-01-01 DIAGNOSIS — I509 Heart failure, unspecified: Secondary | ICD-10-CM | POA: Diagnosis not present

## 2019-01-01 DIAGNOSIS — R1084 Generalized abdominal pain: Secondary | ICD-10-CM | POA: Diagnosis not present

## 2019-01-01 DIAGNOSIS — E43 Unspecified severe protein-calorie malnutrition: Secondary | ICD-10-CM | POA: Diagnosis not present

## 2019-01-01 DIAGNOSIS — R112 Nausea with vomiting, unspecified: Secondary | ICD-10-CM | POA: Diagnosis not present

## 2019-01-02 DIAGNOSIS — E43 Unspecified severe protein-calorie malnutrition: Secondary | ICD-10-CM | POA: Diagnosis not present

## 2019-01-02 DIAGNOSIS — R112 Nausea with vomiting, unspecified: Secondary | ICD-10-CM | POA: Diagnosis not present

## 2019-01-02 DIAGNOSIS — I509 Heart failure, unspecified: Secondary | ICD-10-CM | POA: Diagnosis not present

## 2019-01-02 DIAGNOSIS — I951 Orthostatic hypotension: Secondary | ICD-10-CM | POA: Diagnosis not present

## 2019-01-03 DIAGNOSIS — I951 Orthostatic hypotension: Secondary | ICD-10-CM | POA: Diagnosis not present

## 2019-01-03 DIAGNOSIS — E43 Unspecified severe protein-calorie malnutrition: Secondary | ICD-10-CM | POA: Diagnosis not present

## 2019-01-03 DIAGNOSIS — I509 Heart failure, unspecified: Secondary | ICD-10-CM | POA: Diagnosis not present

## 2019-01-03 DIAGNOSIS — R112 Nausea with vomiting, unspecified: Secondary | ICD-10-CM | POA: Diagnosis not present

## 2019-01-03 MED ORDER — DROXIDOPA 100 MG PO CAPS
200.00 | ORAL_CAPSULE | ORAL | Status: DC
Start: 2019-01-04 — End: 2019-01-03

## 2019-01-03 MED ORDER — GENERIC EXTERNAL MEDICATION
Status: DC
Start: ? — End: 2019-01-03

## 2019-01-03 MED ORDER — GENERIC EXTERNAL MEDICATION
100.00 | Status: DC
Start: ? — End: 2019-01-03

## 2019-01-03 NOTE — Telephone Encounter (Signed)
Notes from [facility] reviewed, Appears they did not d/c on mestinon? Only restarted low dose northera on his d/c day Was sent home still with significant orthostasis  BP after 3 min not recorded in their notes. In my experience, BP continues to drop with time, such as after 3 min. Wife reports weaker now since being in the hospital Certainly would suggest mestinon 60 mg BID Typically the medication is  taken evenly, such as 60mg   Q8, which is the same as the dose sent in by our office  , the 180 mg ER dose She can try either

## 2019-01-06 ENCOUNTER — Other Ambulatory Visit: Payer: Self-pay | Admitting: *Deleted

## 2019-01-06 ENCOUNTER — Encounter: Payer: Self-pay | Admitting: *Deleted

## 2019-01-06 ENCOUNTER — Telehealth: Payer: Self-pay | Admitting: *Deleted

## 2019-01-06 NOTE — Telephone Encounter (Addendum)
Spoke with patients wife per release form and she reports that William Taylor has had a huge turn around and is doing so much better. He is able to get up and walk without having any dizziness and has even started eating again. She was cutting the Mestinon 180 mg ER into 3 pieces and once she took him to [facility] they did make a few changes and now he is doing so much better and almost back to himself. She confirmed he is now taking Mestinon 60 mg twice a day. She did want to do a video visit with Dr. Rockey Situ for post hospital follow up. Scheduled for tomorrow and she confirmed information and requested video call. Instructed her to continue monitoring and let us know if she has any further questions or concerns.      [name], MD at 01/03/2019 2:57 PM   Status: Signed    Notes from [facility] reviewed, Appears they did not d/c on mestinon? Only restarted low dose northera on his d/c day Was sent home still with significant orthostasis  BP after 3 min not recorded in their notes. In my experience, BP continues to drop with time, such as after 3 min. Wife reports weaker now since being in the hospital Certainly would suggest mestinon 60 mg BID Typically the medication is  taken evenly, such as 60mg   Q8, which is the same as the dose sent in by our office  , the 180 mg ER dose She can try either

## 2019-01-06 NOTE — Telephone Encounter (Signed)
YOUR CARDIOLOGY TEAM HAS ARRANGED FOR AN E-VISIT FOR YOUR APPOINTMENT - PLEASE REVIEW IMPORTANT INFORMATION BELOW SEVERAL DAYS PRIOR TO YOUR APPOINTMENT  Due to the recent COVID-19 pandemic, we are transitioning in-person office visits to tele-medicine visits in an effort to decrease unnecessary exposure to our patients, their families, and staff. These visits are billed to your insurance just like a normal visit is. We also encourage you to sign up for MyChart if you have not already done so. You will need a smartphone if possible. For patients that do not have this, we can still complete the visit using a regular telephone but do prefer a smartphone to enable video when possible. You may have a family member that lives with you that can help. If possible, we also ask that you have a blood pressure cuff and scale at home to measure your blood pressure, heart rate and weight prior to your scheduled appointment. Patients with clinical needs that need an in-person evaluation and testing will still be able to come to the office if absolutely necessary. If you have any questions, feel free to call our office.  CONSENT FOR TELE-HEALTH VISIT - PLEASE REVIEW  I hereby voluntarily request, consent and authorize [facility] and its employed or contracted physicians, physician assistants, nurse practitioners or other licensed health care professionals (the Practitioner), to provide me with telemedicine health care services (the "Services") as deemed necessary by the treating Practitioner. I acknowledge and consent to receive the Services by the Practitioner via telemedicine. I understand that the telemedicine visit will involve communicating with the Practitioner through live audiovisual communication technology and the disclosure of certain medical information by electronic transmission. I acknowledge that I have been given the opportunity to request an in-person assessment or other available alternative prior to  the telemedicine visit and am voluntarily participating in the telemedicine visit.  I understand that I have the right to withhold or withdraw my consent to the use of telemedicine in the course of my care at any time, without affecting my right to future care or treatment, and that the Practitioner or I may terminate the telemedicine visit at any time. I understand that I have the right to inspect all information obtained and/or recorded in the course of the telemedicine visit and may receive copies of available information for a reasonable fee.  I understand that some of the potential risks of receiving the Services via telemedicine include:  Marland Kitchen Delay or interruption in medical evaluation due to technological equipment failure or disruption; . Information transmitted may not be sufficient (e.g. poor resolution of images) to allow for appropriate medical decision making by the Practitioner; and/or  . In rare instances, security protocols could fail, causing a breach of personal health information.  Furthermore, I acknowledge that it is my responsibility to provide information about my medical history, conditions and care that is complete and accurate to the best of my ability. I acknowledge that Practitioner's advice, recommendations, and/or decision may be based on factors not within their control, such as incomplete or inaccurate data provided by me or distortions of diagnostic images or specimens that may result from electronic transmissions. I understand that the practice of medicine is not an exact science and that Practitioner makes no warranties or guarantees regarding treatment outcomes. I acknowledge that I will receive a copy of this consent concurrently upon execution via email to the email address I last provided but may also request a printed copy by calling the office of [facility].  I understand that my insurance will be billed for this visit.   I have read or had this consent read to  me. . I understand the contents of this consent, which adequately explains the benefits and risks of the Services being provided via telemedicine.  . I have been provided ample opportunity to ask questions regarding this consent and the Services and have had my questions answered to my satisfaction. . I give my informed consent for the services to be provided through the use of telemedicine in my medical care  By participating in this telemedicine visit I agree to the above.

## 2019-01-06 NOTE — Patient Outreach (Signed)
[facility]) Care Management  01/06/2019  ZAVIER CANELA [birth year] [id number]  Telephone follow up call    Referral source: [facility] case manager Referral reason: Complex care management services   Admission to [facility] 4/28-5/3, Dx : Dizziness, Orthostatic hypotension, Autonomic dysfunction , Anemia, chronic Anemia  Noted ED visits in last 6 months: 2 Noted Admission in last 6 months : 2  PMHX : includes but not limited to Atrial fib, Orthostatic hypotension, GI hemorrhage with melena.   Less than 30 day readmission to [facility] 5/10-5/17, Dx: Autonomic  Orthostatic hypotension, abdominal pain, nausea    Successful outreach call to patient wife, Zimir Kittleson, she discussed patient recent admission and discharge from [facility]. She discussed that patient is doing much better and being unsure what they done differently besides giving IV fluids and vitamins.   Orthostatic hypotension  Wife reports that patient is now using rolling walker getting around home, able to tolerate sitting up at desk using computer.  She reports patient denying being dizzy, she states he is tolerating orthostatic blood pressure reading all 3 ways. When asked about readings she was not able to give actual readings at time but states none where low. Patient has TED hose and abdominal binder, he does not have on currently but has been wearing since at home reviewed with wife reasoning behind wearing .  Patient continues to decline home health physical therapy due to Covid 19 , but has been working on bed exercises taught by PT while inpatient .   Nutrition Patient tolerating diet, denies nausea reports "eating like a pig". She discussed patient weight today was 185, noted weight at discharge from [facility] was 182.8.  Patient continues to decline drinking nutrition supplement such as ensure/boost. Reinforced importance of balanced  Nutrition, protein in diet  and monitoring weights at home.   Social  Wife  discussed death of her sister and brother in law as she was the POA for the, condolences offered. Patient discussed that her daughter has been helping.  Wife reports having needed home equipment in place.  Wife discussed wanting to get some help with bathing patient but he is declining home health /or personal visit to this home at this time she is hopeful that he will change his mind after speaking with Dr.Gollan on tomorrow.  Medications  Patient was recently discharged from hospital and all medications have been reviewed. Wife discussed call to PCP office regarding Mestinon not being restarted at [facility] and patient wants to wait and talk with Dr. Rockey Situ on tomorrow before restarting .  Appointments  Dr. Rockey Situ on 5/21 telehealth visit  Wife will plan call to Dr. Biagio Quint for next post discharge visit.   Wife denies any other concerns at this time.   Plan  Will plan follow up call in the next week, will complete assessment and send MD visit note.  Encouraged wife to call sooner for any new concerns .    THN CM Care Plan Problem One     Most Recent Value  Care Plan Problem One  High risk for hospital readmission related to recent discharge , due to orthostatic hypotension , GI discomfort  [hospital readmission ]  Role Documenting the Problem One  Care Management [place] for Problem One  Active  THN Long Term Goal   Patient will not experience a hospital admission over the next 31 days   THN Long Term Goal Start Date  12/21/18 Barrie Folk date restart  after admission ]  Interventions for Problem One Long Term Goal  Review of recent discharge instruction recommenations for follow up , medications, encouraged regarding use of abd binder and hose and rationale. Reviewed MD next visit and scheduling PCP post DC visit.   THN CM Short Term Goal #1   Over the next 30 days patient will be able to report improvement in daily activity by participation in home exercise plan   THN CM Short Term  Goal #1 Start Date  12/21/18  Interventions for Short Term Goal #1  Discussed current mobiilty status, and reinforced benefits of exericise plan to increase strenght and balance, Discussed the role of home PT and benefits to provide education and support    THN CM Short Term Goal #2   Over the next 30 days patient/wife will continue to monitor blood pressure readings,   THN CM Short Term Goal #2 Start Date  01/06/19 Barrie Folk restart ]  Interventions for Short Term Goal #2  Reviewed recent blood pressure readings , encouraged to continue to monitor and keep a record for medical appointments review      Joylene Draft, RN, [place] Management Coordinator  [phone]- Mobile [phone]- [place]

## 2019-01-07 ENCOUNTER — Other Ambulatory Visit: Payer: Self-pay

## 2019-01-07 ENCOUNTER — Telehealth (INDEPENDENT_AMBULATORY_CARE_PROVIDER_SITE_OTHER): Payer: Medicare HMO | Admitting: Cardiovascular Disease

## 2019-01-07 DIAGNOSIS — Z7901 Long term (current) use of anticoagulants: Secondary | ICD-10-CM

## 2019-01-07 DIAGNOSIS — R110 Nausea: Secondary | ICD-10-CM

## 2019-01-07 DIAGNOSIS — R5382 Chronic fatigue, unspecified: Secondary | ICD-10-CM

## 2019-01-07 DIAGNOSIS — R69 Illness, unspecified: Secondary | ICD-10-CM | POA: Diagnosis not present

## 2019-01-07 DIAGNOSIS — I951 Orthostatic hypotension: Secondary | ICD-10-CM

## 2019-01-07 DIAGNOSIS — D509 Iron deficiency anemia, unspecified: Secondary | ICD-10-CM

## 2019-01-07 DIAGNOSIS — I2510 Atherosclerotic heart disease of native coronary artery without angina pectoris: Secondary | ICD-10-CM

## 2019-01-07 DIAGNOSIS — I4891 Unspecified atrial fibrillation: Secondary | ICD-10-CM | POA: Diagnosis not present

## 2019-01-07 DIAGNOSIS — E782 Mixed hyperlipidemia: Secondary | ICD-10-CM | POA: Diagnosis not present

## 2019-01-07 DIAGNOSIS — I480 Paroxysmal atrial fibrillation: Secondary | ICD-10-CM

## 2019-01-07 DIAGNOSIS — F32A Depression, unspecified: Secondary | ICD-10-CM

## 2019-01-07 DIAGNOSIS — Z79899 Other long term (current) drug therapy: Secondary | ICD-10-CM | POA: Diagnosis not present

## 2019-01-07 DIAGNOSIS — N183 Chronic kidney disease, stage 3 (moderate): Secondary | ICD-10-CM | POA: Diagnosis not present

## 2019-01-07 DIAGNOSIS — F329 Major depressive disorder, single episode, unspecified: Secondary | ICD-10-CM

## 2019-01-07 NOTE — Patient Instructions (Signed)
Medication Instructions:  No changes  If you need a refill on your cardiac medications before your next appointment, please call your pharmacy.    Lab work: No new labs needed   If you have labs (blood work) drawn today and your tests are completely normal, you will receive your results only by: Marland Kitchen MyChart Message (if you have MyChart) OR . A paper copy in the mail If you have any lab test that is abnormal or we need to change your treatment, we will call you to review the results.   Testing/Procedures: No new testing needed   Follow-Up: At [facility], you and your health needs are our priority.  As part of our continuing mission to provide you with exceptional heart care, we have created designated Provider Care Teams.  These Care Teams include your primary Cardiologist (physician) and Advanced Practice Providers (APPs -  Physician Assistants and Nurse Practitioners) who all work together to provide you with the care you need, when you need it.  . You will need a follow up appointment in 3 months .   Please call our office 2 months in advance to schedule this appointment.    . Providers on your designated Care Team:   . Murray Hodgkins, NP . Christell Faith, PA-C . Marrianne Mood, PA-C  Any Other Special Instructions Will Be Listed Below (If Applicable).  For educational health videos Log in to : [email or web address] Or : [email or web address], password : [email or web address]

## 2019-01-07 NOTE — Progress Notes (Signed)
Virtual Visit via Video Note   This visit type was conducted due to national recommendations for restrictions regarding the COVID-19 Pandemic (e.g. social distancing) in an effort to limit this patient's exposure and mitigate transmission in our community.  Due to her co-morbid illnesses, this patient is at least at moderate risk for complications without adequate follow up.  This format is felt to be most appropriate for this patient at this time.  All issues noted in this document were discussed and addressed.  A limited physical exam was performed with this format.  Please refer to the patient's chart for her consent to telehealth for [facility].    Date:  01/07/2019   ID:  William Taylor, DOB 1930/01/01, MRN [id number]  Patient Location:  [place] [address]   Provider location:   [facility], [place] office  PCP:  [name], MD  Cardiologist:  Patsy Baltimore  Chief Complaint:  Dizzy, low blood pressures   History of Present Illness:    William Taylor is a 85 y.o. male who presents via audio/video conferencing for a telehealth visit today.   The patient does not symptoms concerning for COVID-19 infection (fever, chills, cough, or new SHORTNESS OF BREATH).   Patient has a past medical history of coronary artery disease, stent in 2005,  atrial fibrillation with RVR in the setting of UTI September 2011,  stent placement May 2012 to the OM 2,  PE following catheterization started on anticoagulation, atrial fibrillation in 2013,  Last atrial fibrillation July 2018  atypical chest pain July 2018 History of anemia who presents for follow-up of his coronary disease and atrial fibrillation  Recent admission at [facility] for debility, chronic nausea, weight loss, depression  Med list since d/c from the hospital reviewed with him Amiodarone 200 daily northera 200 TID Florinef: 0.1 mg once a day Midodrine: 10 mg three times a day   Mestinon: has not started  Off ranexa On remeron, eating better, sleeping better [place] taking B vitamin, thiamine, MVI   checking pressure at home Laying 165/92. 160/90 Sitting 141/77, 117/74 Standing 139/79, 119/73 Did not check at 3 min  Little swelling  In ankles HCT 29 Reports lots of urination 3 to 5 trips to bathroom when trying to sleep Reports small amount  Has appt at [facility] neurology June   Wife reports he is in better spirits, eating better, less nausea Weight up 5 pounds   Prior CV studies:   The following studies were reviewed today:   Past Medical History:  Diagnosis Date  . Cancer [facility])    Prostate, followed by Dr. Jacqlyn Larsen  . Colon polyp   . Coronary artery disease   . Gall stones   . Hyperlipidemia   . Hypertension   . MI (myocardial infarction) ([place])    2015  . PAF (paroxysmal atrial fibrillation) ([place])    a. on xarelto  . Skin cancer    Past Surgical History:  Procedure Laterality Date  . CARDIAC CATHETERIZATION  10/2013   [facility]  . CATARACT EXTRACTION  Oct. 3, 2012   right eye  . colonoscopy    . COLONOSCOPY W/ POLYPECTOMY  2015   Dr Rayann Heman  . COLONOSCOPY WITH PROPOFOL N/A 08/15/2016   Procedure: COLONOSCOPY WITH PROPOFOL;  Surgeon: [name], MD;  Location: [facility] ENDOSCOPY;  Service: Endoscopy;  Laterality: N/A;  . CORONARY ANGIOPLASTY  2009   2005; s/p stent  . ENTEROSCOPY N/A 09/22/2018  Procedure: ENTEROSCOPY;  Surgeon: Lin Landsman, MD;  Location: [facility] ENDOSCOPY;  Service: Gastroenterology;  Laterality: N/A;  . ESOPHAGOGASTRODUODENOSCOPY (EGD) WITH PROPOFOL N/A 08/15/2016   Procedure: ESOPHAGOGASTRODUODENOSCOPY (EGD) WITH PROPOFOL;  Surgeon: [name], MD;  Location: [facility] ENDOSCOPY;  Service: Endoscopy;  Laterality: N/A;  . GIVENS CAPSULE STUDY N/A 09/26/2016   Procedure: GIVENS CAPSULE STUDY;  Surgeon: [name], MD;  Location: [facility] ENDOSCOPY;  Service: Endoscopy;  Laterality: N/A;  . HERNIA REPAIR  2013  . LUMBAR SPINE SURGERY    .  UPPER GI ENDOSCOPY  Sept 2015   Dr Rayann Heman     No outpatient medications have been marked as taking for the 01/07/19 encounter (Appointment) with [name], MD.     Allergies:   Bee venom; Sulfasalazine; Sulfa antibiotics; and Warfarin and related   Social History   Tobacco Use  . Smoking status: Never Smoker  . Smokeless tobacco: Never Used  Substance Use Topics  . Alcohol use: No  . Drug use: No     Current Outpatient Medications on File Prior to Visit  Medication Sig Dispense Refill  . amiodarone (PACERONE) 100 MG tablet Take 1 tablet (100 mg total) by mouth daily for 30 days. 30 tablet 0  . amiodarone (PACERONE) 200 MG tablet Take 1 tablet (200 mg total) by mouth daily. (Patient not taking: Reported on 01/06/2019) 90 tablet 2  . Droxidopa (NORTHERA) 100 MG CAPS Take 400 mg by mouth 3 (three) times daily for 30 days. 90 capsule 0  . finasteride (PROSCAR) 5 MG tablet Take 1 tablet (5 mg total) by mouth daily. 90 tablet 2  . fludrocortisone (FLORINEF) 0.1 MG tablet Take 2 tablets (0.2 mg total) by mouth daily for 30 days. 60 tablet 0  . levobunolol (BETAGAN) 0.5 % ophthalmic solution Place 1 drop into the right eye 2 (two) times daily.     Marland Kitchen levothyroxine (SYNTHROID, LEVOTHROID) 50 MCG tablet Take 1 tablet (50 mcg total) by mouth daily at 6 (six) AM. 90 tablet 0  . midodrine (PROAMATINE) 10 MG tablet Take 1 tablet (10 mg total) by mouth 3 (three) times daily. 90 tablet 3  . mirtazapine (REMERON) 15 MG tablet Take 15 mg by mouth at bedtime.    . Multiple Vitamin (MULTIVITAMIN PO) Take 1 tablet by mouth daily.    . nitroGLYCERIN (NITROSTAT) 0.4 MG SL tablet DISSOLVE UNDER THE TONGUE 1 TABLET EVERY 5 MINUTES AS NEEDED FOR CHEST PAIN 25 tablet 3  . ondansetron (ZOFRAN-ODT) 8 MG disintegrating tablet Take 0.5 tablets (4 mg total) by mouth every 8 (eight) hours as needed for nausea. (Patient not taking: Reported on 01/06/2019) 20 tablet 1  . pantoprazole (PROTONIX) 40 MG tablet Take 1  tablet (40 mg total) by mouth 2 (two) times daily. (Patient taking differently: Take 40 mg by mouth daily. ) 60 tablet 2  . pravastatin (PRAVACHOL) 40 MG tablet TAKE 1 TABLET BY MOUTH AT  BEDTIME 90 tablet 3  . pyridostigmine (MESTINON) 60 MG tablet Take 1 tablet (60 mg total) by mouth 2 (two) times daily at 8 am and 4 pm for 30 days. (Patient not taking: Reported on 01/06/2019) 60 tablet 0  . QC NATURA-LAX powder MIX AND TAKE 17 GM (LINE ON INSIDE OF LID) TWICE DAILY AS NEEDED 238 g 0  . ranolazine (RANEXA) 500 MG 12 hr tablet Take 1 tablet (500 mg total) by mouth 2 (two) times daily. (Patient not taking: Reported on 01/06/2019) 180 tablet 3  . Vitamin  D, Ergocalciferol, (DRISDOL) 1.25 MG (50000 UT) CAPS capsule Take 1 capsule (50,000 Units total) by mouth every 7 (seven) days. 4 capsule 1  . XARELTO 20 MG TABS tablet TAKE 1 TABLET BY MOUTH  DAILY 90 tablet 0   No current facility-administered medications on file prior to visit.      Family Hx: The patient's family history includes Colon cancer in his father; Coronary artery disease in his brother; Other (age of onset: 64) in his brother; Stroke in his mother.  ROS:   Please see the history of present illness.    Review of Systems  Constitutional: Positive for malaise/fatigue.  Respiratory: Negative.   Cardiovascular: Negative.   Gastrointestinal: Negative.   Musculoskeletal: Negative.   Neurological: Positive for dizziness.  Psychiatric/Behavioral: Negative.   All other systems reviewed and are negative.   Labs/Other Tests and Data Reviewed:    Recent Labs: 09/23/2018: Magnesium 1.7 12/15/2018: TSH 1.116 12/19/2018: ALT 13; BUN 12; Creatinine, Ser 0.61; Hemoglobin 10.4; Platelets 280; Potassium 4.7; Sodium 135   Recent Lipid Panel Lab Results  Component Value Date/Time   CHOL 110 06/28/2016 11:06 AM   CHOL 119 10/27/2013 06:09 AM   TRIG 80.0 06/28/2016 11:06 AM   TRIG 61 10/27/2013 06:09 AM   HDL 29.90 (L) 06/28/2016 11:06 AM    HDL 38 (L) 10/27/2013 06:09 AM   CHOLHDL 4 06/28/2016 11:06 AM   LDLCALC 64 06/28/2016 11:06 AM   LDLCALC 69 10/27/2013 06:09 AM    Wt Readings from Last 3 Encounters:  12/20/18 180 lb 11.2 oz (82 kg)  11/18/18 190 lb (86.2 kg)  10/23/18 198 lb 6.4 oz (90 kg)     Exam:    Vital Signs: Vital signs may also be detailed in the HPI  Wt Readings from Last 3 Encounters:  12/20/18 180 lb 11.2 oz (82 kg)  11/18/18 190 lb (86.2 kg)  10/23/18 198 lb 6.4 oz (90 kg)   Temp Readings from Last 3 Encounters:  12/20/18 98.3 F (36.8 C) (Oral)  10/23/18 98 F (36.7 C) (Tympanic)  09/30/18 97.7 F (36.5 C) (Oral)   BP Readings from Last 3 Encounters:  12/20/18 (!) 138/93  11/18/18 121/79  10/23/18 137/89   Pulse Readings from Last 3 Encounters:  12/20/18 67  11/18/18 90  10/23/18 82    Vitals detailed in HPI resp 16 Pulse 70  Well nourished, well developed male in no acute distress. Constitutional:  oriented to person, place, and time. No distress.    ASSESSMENT & PLAN:    Severe orthostasis on the Northera with Florinef midodrine 10mg   3 times daily Eating better on remoron, off ranexa, Recommended caution, check BP at 3 min Will not start mestinon as he is eating well   Mixed hyperlipidemia Cholesterol at goal, no further changes made Stable  Coronary artery disease involving native coronary artery of native heart without angina pectoris Recent stress test no ischemia, no further work-up needed Stable  Atrial fibrillation, unspecified type ([place]) on Xarelto Continue low-dose amiodarone  Chronic nausea Etiology unclear, perhaps better with vitamins, off ranexa, on remeron Eating better  Depression Improved on remeron  Chronic kidney disease (CKD), stage III (moderate) Encouraged him to stay hydrated  Iron deficiency anemia, unspecified iron deficiency anemia type Eating better    COVID-19 Education: The signs and symptoms of COVID-19 were  discussed with the patient and how to seek care for testing (follow up with PCP or arrange E-visit).  The importance of social distancing was  discussed today.  Patient Risk:   After full review of this patients clinical status, I feel that they are at least moderate risk at this time.  Time:   Today, I have spent 25 minutes with the patient with telehealth technology discussing the cardiac and medical problems/diagnoses detailed above  , discussed orthostasis,  malnutrition  Medication Adjustments/Labs and Tests Ordered: Current medicines are reviewed at length with the patient today.  Concerns regarding medicines are outlined above.   Tests Ordered: No tests ordered   Medication Changes: No changes made   Disposition: Follow-up in 3 months   Signed, Ida Rogue, MD  01/07/2019 2:40 PM    [place] Office [name] [place] [address], [place], [place] [address]

## 2019-01-11 DIAGNOSIS — F32A Depression, unspecified: Secondary | ICD-10-CM | POA: Insufficient documentation

## 2019-01-11 DIAGNOSIS — F329 Major depressive disorder, single episode, unspecified: Secondary | ICD-10-CM | POA: Insufficient documentation

## 2019-01-13 ENCOUNTER — Other Ambulatory Visit: Payer: Self-pay | Admitting: *Deleted

## 2019-01-13 NOTE — Patient Outreach (Signed)
[facility]) Care Management  [place]  01/13/2019   ENGLISH CRAIGHEAD 1930/07/22 [id number]   Telephone follow up assessment    Referral source: [facility] case manager Referral reason: Complex care management services   Admission to [facility] 4/28-5/3, Dx : Dizziness, Orthostatic hypotension, Autonomic dysfunction , Anemia, chronic Anemia,   PMHX : includes but not limited to Atrial fib, Orthostatic hypotension, GI hemorrhage with melena, PE, CAD, stent placement,  Congestive heart failure , CKD.  Cold agglutinin disease.   Less than 30 day readmission to [facility] 5/10-5/17, Dx: Autonomic  Orthostatic hypotension, abdominal pain, nausea   Subjective:  Successful outreach call to patient, he reports feeling much better. He discussed riding with wife to do errands , agreeable to talk at this time.  He discussed recent admission to [facility] states has been doing better since medication changes.   Encounter Medications:  Outpatient Encounter Medications as of 01/13/2019  Medication Sig Note  . amiodarone (PACERONE) 100 MG tablet Take 1 tablet (100 mg total) by mouth daily for 30 days. (Patient not taking: Reported on 01/07/2019)   . amiodarone (PACERONE) 200 MG tablet Take 1 tablet (200 mg total) by mouth daily.   . Droxidopa (NORTHERA) 100 MG CAPS Take 400 mg by mouth 3 (three) times daily for 30 days. 01/06/2019: Taking  2 capsule 3 times a day   . finasteride (PROSCAR) 5 MG tablet Take 1 tablet (5 mg total) by mouth daily.   . fludrocortisone (FLORINEF) 0.1 MG tablet Take 2 tablets (0.2 mg total) by mouth daily for 30 days.   Marland Kitchen levobunolol (BETAGAN) 0.5 % ophthalmic solution Place 1 drop into the right eye 2 (two) times daily.    Marland Kitchen levothyroxine (SYNTHROID, LEVOTHROID) 50 MCG tablet Take 1 tablet (50 mcg total) by mouth daily at 6 (six) AM.   . midodrine (PROAMATINE) 10 MG tablet Take 1 tablet (10 mg total) by mouth 3 (three) times daily.   . mirtazapine (REMERON) 15 MG  tablet Take 15 mg by mouth at bedtime.   . Multiple Vitamin (MULTIVITAMIN PO) Take 1 tablet by mouth daily.   . nitroGLYCERIN (NITROSTAT) 0.4 MG SL tablet DISSOLVE UNDER THE TONGUE 1 TABLET EVERY 5 MINUTES AS NEEDED FOR CHEST PAIN 12/21/2018: Has on hand   . ondansetron (ZOFRAN-ODT) 8 MG disintegrating tablet Take 0.5 tablets (4 mg total) by mouth every 8 (eight) hours as needed for nausea. (Patient not taking: Reported on 01/06/2019) 12/22/2018: Taking 8mg  40 minutes before meals   . pantoprazole (PROTONIX) 40 MG tablet Take 1 tablet (40 mg total) by mouth 2 (two) times daily. (Patient taking differently: Take 40 mg by mouth daily. )   . pravastatin (PRAVACHOL) 40 MG tablet TAKE 1 TABLET BY MOUTH AT  BEDTIME   . pyridostigmine (MESTINON) 60 MG tablet Take 1 tablet (60 mg total) by mouth 2 (two) times daily at 8 am and 4 pm for 30 days. (Patient not taking: Reported on 01/06/2019) 12/22/2018: Currently splitting the 180mg  tablet. Plans to pick up from drug store soon   . QC NATURA-LAX powder MIX AND TAKE 17 GM (LINE ON INSIDE OF LID) TWICE DAILY AS NEEDED   . ranolazine (RANEXA) 500 MG 12 hr tablet Take 1 tablet (500 mg total) by mouth 2 (two) times daily. (Patient not taking: Reported on 01/06/2019)   . Vitamin D, Ergocalciferol, (DRISDOL) 1.25 MG (50000 UT) CAPS capsule Take 1 capsule (50,000 Units total) by mouth every 7 (seven) days. 12/21/2018: Takes on  Saturday.   Alveda Reasons 20 MG TABS tablet TAKE 1 TABLET BY MOUTH  DAILY    No facility-administered encounter medications on file as of 01/13/2019.     Functional Status:  In your present state of health, do you have any difficulty performing the following activities: 01/06/2019 12/15/2018  Hearing? Y Y  Comment wife has taken hearing aide in for repair  -  Vision? N N  Difficulty concentrating or making decisions? N N  Walking or climbing stairs? Y Y  Comment using walker  -  Dressing or bathing? Y N  Comment wife helps  -  Doing errands, shopping? Y N   Comment wife helps  -  Conservation officer, nature and eating ? - -  Using the Toilet? Y -  Comment wife assist  -  In the past six months, have you accidently leaked urine? N -  Do you have problems with loss of bowel control? N -  Managing your Medications? Y -  Comment wife assist  -  Managing your Finances? Y -  Comment wife assist  -  Housekeeping or managing your Housekeeping? Y -  Comment wife handles  -  Some recent data might be hidden    Fall/Depression Screening: Fall Risk  01/06/2019 06/30/2018 06/26/2018  Falls in the past year? 1 0 0  Number falls in past yr: 1 - -  Injury with Fall? 1 - -  Risk for fall due to : History of fall(s) - -   PHQ 2/9 Scores 01/13/2019 06/26/2018 07/16/2017 06/28/2016  PHQ - 2 Score 0 0 0 0    Assessment:    Orthostatic hypotension  Improved, tolerating activity in home using rolling walker. He denies dizziness. Reports improvement with blood pressure with change in position. Unable to state recent blood pressure reading at this time .  Patient denies having recent fall.  Patient denies shortness of breath,swelling or weight gain.  Nutrition  Denies having nausea, tolerating diet,and liquids with a  good appetite. Reports weight gain , up to 185 lbs recent reported weight 182.5  Appointments  [facility] neurology visit in June Patient denies any new concerns at this time.   Plan Will send PCP visit note Will plan follow up call to patient in the next 3 weeks.   THN CM Care Plan Problem One     Most Recent Value  Care Plan Problem One  High risk for hospital readmission related to recent discharge , due to orthostatic hypotension , GI discomfort  [hospital readmission ]  Role Documenting the Problem One  Care Management [place] for Problem One  Active  THN Long Term Goal   Patient will not experience a hospital admission over the next 31 days   THN Long Term Goal Start Date  12/21/18 [goal date restart  after admission ]   Interventions for Problem One Long Term Goal  Discussed current clinical state, encouraged regarding contining to take medication as prescribed, notiyfing MD of recurrent orthostatic symptoms or diet intolerance.   THN CM Short Term Goal #1   Over the next 30 days patient will be able to report improvement in daily activity by participation in home exercise plan   THN CM Short Term Goal #1 Start Date  12/21/18  Interventions for Short Term Goal #1  Reinforced continuing bed exercise plan   THN CM Short Term Goal #2   Over the next 30 days patient/wife will continue to monitor blood pressure readings,  THN CM Short Term Goal #2 Start Date  01/06/19 Barrie Folk restart ]  Interventions for Short Term Goal #2  Encouraged to continue monitoring blood pressure and notify MD of concern related to reading and symptoms with change in position.,       Joylene Draft, RN, [place] Management Coordinator  [phone]- Mobile [phone]- Toll Free Main Office

## 2019-01-14 ENCOUNTER — Telehealth: Payer: Self-pay | Admitting: *Deleted

## 2019-01-14 ENCOUNTER — Other Ambulatory Visit: Payer: Self-pay | Admitting: *Deleted

## 2019-01-14 NOTE — Patient Outreach (Signed)
[place] [facility]) Care Management  01/14/2019  William Taylor November 15, 1929 [id number]   Patient case has been discussed in a multidisciplinary case discussion .    Joylene Draft, RN, [place] Management Coordinator  [phone]- Mobile [phone]- Toll Free Main Office

## 2019-01-14 NOTE — Telephone Encounter (Signed)
Called to see how patient was feeling and see if he would like to come in and get labs draw now if he is feeling better.  The wife answers the phone and tells patient who I am. He tells me that they changed some meds around and he is up and doing for himself, feels stronger, no longer nauseated and has a good appetite and does not think  He had appt that he missed. I explained that the wife had called and felt that he was doing so bad that maybe we could find something wrong that would help him and Dr. Janese Banks agreed for him to come in and get labs first. She agreed but then he ended up getting admitted to [facility].  He does have lab only due for 6/5, they thought they had appt in June but did not remember date. They said they will be there. I gave them date and time and changed the labs ordered to new date

## 2019-01-15 ENCOUNTER — Other Ambulatory Visit: Payer: Self-pay | Admitting: Pharmacist

## 2019-01-15 ENCOUNTER — Other Ambulatory Visit: Payer: Self-pay | Admitting: Family Medicine

## 2019-01-15 NOTE — Patient Outreach (Signed)
[place] [facility]) Care Management  [place]   01/15/2019  TRAVORIS BUSHEY 10-Oct-1929 [id number]  Reason for referral: Medication Management support, post-discharge medication review  Referral source: [facility] Inpatient Liaison Current insurance: [employer]  PMHx includes but not limited to:  Orthostatic hypotension, autonomic dysfunction, chronic atrial fibrillation, anemia, benign prostate hypertrophy  Outreach:  Successful telephone call with patient's wife, Ivin Booty.  HIPAA identifiers verified. She reports that the patient is doing much better since his discharge from [facility] on 01/03/2019. His appetite has much improved, and he overall seems to have more energy. They have had follow up with Dr. Rockey Situ since discharge, and have an appointment with neurology at [facility] next week.  Objective:  Lab Results  Component Value Date   CREATININE 0.61 12/19/2018   CREATININE 0.67 12/18/2018   CREATININE 0.82 12/16/2018    Lab Results  Component Value Date   HGBA1C 5.5 09/16/2018    Lipid Panel     Component Value Date/Time   CHOL 110 06/28/2016 1106   CHOL 119 10/27/2013 0609   TRIG 80.0 06/28/2016 1106   TRIG 61 10/27/2013 0609   HDL 29.90 (L) 06/28/2016 1106   HDL 38 (L) 10/27/2013 0609   CHOLHDL 4 06/28/2016 1106   VLDL 16.0 06/28/2016 1106   VLDL 12 10/27/2013 0609   LDLCALC 64 06/28/2016 1106   LDLCALC 69 10/27/2013 0609    BP Readings from Last 3 Encounters:  12/20/18 (!) 138/93  11/18/18 121/79  10/23/18 137/89    Allergies  Allergen Reactions  . Bee Venom Swelling  . Sulfasalazine Hives and Swelling  . Sulfa Antibiotics Hives and Swelling  . Warfarin And Related Other (See Comments)    Chest pain    Medications Reviewed Today    Reviewed by De Hollingshead, [facility] (Pharmacist) on 01/15/19 at 1330  Med List Status: <None>  Medication Order Taking? Sig Documenting Provider Last Dose Status Informant  amiodarone (PACERONE) 100 MG tablet  [id number] Yes Take 1 tablet (100 mg total) by mouth daily for 30 days. Saundra Shelling, MD Taking Active   cyproheptadine (PERIACTIN) 4 MG tablet [id number] Yes Take 4 mg by mouth 3 (three) times daily as needed. [provider] Taking Active   Droxidopa ([place]) 100 MG CAPS [id number] Yes Take 400 mg by mouth 3 (three) times daily for 30 days.  Patient taking differently:  Take 200 mg by mouth 3 (three) times daily.    Saundra Shelling, MD Taking Active            Med Note Darnelle Maffucci, Arville Lime   Fri Jan 15, 2019  1:22 PM)    fenofibrate micronized (ANTARA) 130 MG capsule [id number] Yes Take 130 mg by mouth daily before breakfast. [provider] Taking Active   finasteride (PROSCAR) 5 MG tablet [id number] Yes Take 1 tablet (5 mg total) by mouth daily. Abbie Sons, MD Taking Active Self  fludrocortisone (FLORINEF) 0.1 MG tablet [id number] Yes Take 2 tablets (0.2 mg total) by mouth daily for 30 days. Saundra Shelling, MD Taking Active            Med Note Darnelle Maffucci, Arville Lime   Fri Jan 15, 2019  1:24 PM) Taking 1 tablet daily  levobunolol (BETAGAN) 0.5 % ophthalmic solution [id number] Yes Place 1 drop into the right eye 2 (two) times daily.  [provider] Taking Active Self  levothyroxine (SYNTHROID, LEVOTHROID) 50 MCG tablet [id number] Yes Take 1 tablet (50 mcg total) by mouth daily at  6 (six) AM. Gorden Harms, MD Taking Active Self  Melatonin 3 MG CAPS [id number] Yes Take 2 capsules by mouth every evening. [provider] Taking Active   midodrine (PROAMATINE) 10 MG tablet [id number] Yes Take 1 tablet (10 mg total) by mouth 3 (three) times daily. [name], MD Taking Active Self  mirtazapine (REMERON) 15 MG tablet [id number] Yes Take 15 mg by mouth at bedtime. [provider] Taking Active   Multiple Vitamin (MULTIVITAMIN PO) [id number] Yes Take 1 tablet by mouth daily. [provider] Taking Active Spouse/Significant Other  nitroGLYCERIN  (NITROSTAT) 0.4 MG SL tablet [id number] No DISSOLVE UNDER THE TONGUE 1 TABLET EVERY 5 MINUTES AS NEEDED FOR CHEST PAIN  Patient not taking:  Reported on 01/15/2019   [name], MD Not Taking Active Self           Med Note Joylene Draft A   Mon Dec 21, 2018  3:02 PM) Has on hand   ondansetron (ZOFRAN-ODT) 8 MG disintegrating tablet [id number] No Take 0.5 tablets (4 mg total) by mouth every 8 (eight) hours as needed for nausea.  Patient not taking:  Reported on 01/06/2019   Jodelle Green, FNP Not Taking Active Self           Med Note Erling Cruz, ANNA K   Tue Dec 22, 2018  4:42 PM) Taking 8mg  40 minutes before meals   pantoprazole (PROTONIX) 40 MG tablet [id number] Yes Take 1 tablet (40 mg total) by mouth 2 (two) times daily.  Patient taking differently:  Take 40 mg by mouth daily.    [name], MD Taking Active Self  pravastatin (PRAVACHOL) 40 MG tablet [id number] Yes TAKE 1 TABLET BY MOUTH AT  BEDTIME [name], MD Taking Active Self  pyridostigmine (MESTINON) 60 MG tablet [id number] No Take 1 tablet (60 mg total) by mouth 2 (two) times daily at 8 am and 4 pm for 30 days.  Patient not taking:  Reported on 01/06/2019   Saundra Shelling, MD Not Taking Active            Med Note De Hollingshead   Fri Jan 15, 2019  1:29 PM)    QC NATURA-LAX powder [id number] Yes MIX AND TAKE 17 GM (LINE ON INSIDE OF LID) TWICE DAILY AS NEEDED [name], MD Taking Active Self  ranolazine (RANEXA) 500 MG 12 hr tablet [id number] No Take 1 tablet (500 mg total) by mouth 2 (two) times daily.  Patient not taking:  Reported on 01/06/2019   [name], MD Not Taking Active Self  thiamine (VITAMIN B-1) 100 MG tablet [id number] Yes Take 100 mg by mouth daily. [provider] Taking Active   Vitamin D, Ergocalciferol, (DRISDOL) 1.25 MG (50000 UT) CAPS capsule [id number] Yes Take 1 capsule (50,000 Units total) by mouth every 7 (seven) days. Saundra Shelling, MD Taking Active            Med Note  Jonathon Jordan Dec 21, 2018  3:07 PM) Takes on Saturday.   XARELTO 20 MG TABS tablet [id number] Yes TAKE 1 TABLET BY MOUTH  DAILY Gollan, Kathlene November, MD Taking Active Self          Assessment:  Date Discharged from Hospital: 01/03/2019 Date Medication Reconciliation Performed: 01/15/2019  Medications:  New at Discharge: . Thiamine . Multivitamin . Mirtazapine . Melatonin  Adjustments at Discharge: . Droxipoda  Discontinued at Discharge:   Tamsulosin  Ranexa  Pyridostigmine -held  Patient was recently discharged from hospital and all medications have been reviewed.   Plan: - Counseled patient's wife on purpose, benefits, side effects of mirtazapine therapy. She verbalized understanding and denied any needs at this time - Confirmed neurology appointment on 01/22/2019 - PharmD will follow up in 2 weeks for continued medication management support.  - Will route to PCP for [facility], PharmD, [place] PGY2 Ambulatory Care Pharmacy Resident, [place] [facility] Phone: [phone]

## 2019-01-18 ENCOUNTER — Other Ambulatory Visit: Payer: Self-pay

## 2019-01-21 ENCOUNTER — Telehealth: Payer: Self-pay | Admitting: Family Medicine

## 2019-01-21 DIAGNOSIS — K5790 Diverticulosis of intestine, part unspecified, without perforation or abscess without bleeding: Secondary | ICD-10-CM

## 2019-01-21 NOTE — Telephone Encounter (Signed)
Amy Harward, Navigator for patient unexpected findings, [facility] called to report findings on CT scan 12/27/2018 at [facility] ED; findings: wall thickening of the cecum; if clinically appropriate, then follow up colonoscopy should be done; findings repeated for accuracy; she will contact the pt and have him call the office for recommendations; she also says that a copy of the report will be faxed to the office; the pt is seen Dr Caryl Bis, [place]; notified Juliann Pulse of results; will route to office for notification.

## 2019-01-21 NOTE — Telephone Encounter (Signed)
Advised patient I would follow up with PCP for next steps?

## 2019-01-21 NOTE — Telephone Encounter (Signed)
Pt calling back about report. Pt would like a call back   [phone]

## 2019-01-22 ENCOUNTER — Inpatient Hospital Stay: Payer: Medicare HMO | Attending: Oncology | Admitting: *Deleted

## 2019-01-22 ENCOUNTER — Other Ambulatory Visit: Payer: Self-pay

## 2019-01-22 ENCOUNTER — Telehealth: Payer: Self-pay | Admitting: Cardiovascular Disease

## 2019-01-22 DIAGNOSIS — D591 Other autoimmune hemolytic anemias: Secondary | ICD-10-CM | POA: Diagnosis not present

## 2019-01-22 DIAGNOSIS — D5912 Cold autoimmune hemolytic anemia: Secondary | ICD-10-CM

## 2019-01-22 LAB — CBC WITH DIFFERENTIAL/PLATELET
Abs Immature Granulocytes: 0.05 10*3/uL (ref 0.00–0.07)
Basophils Absolute: 0 10*3/uL (ref 0.0–0.1)
Basophils Relative: 1 %
Eosinophils Absolute: 0 10*3/uL (ref 0.0–0.5)
Eosinophils Relative: 1 %
HCT: 30.8 % — ABNORMAL LOW (ref 39.0–52.0)
Hemoglobin: 9.9 g/dL — ABNORMAL LOW (ref 13.0–17.0)
Immature Granulocytes: 1 %
Lymphocytes Relative: 22 %
Lymphs Abs: 1.4 10*3/uL (ref 0.7–4.0)
MCH: 34.4 pg — ABNORMAL HIGH (ref 26.0–34.0)
MCHC: 32.1 g/dL (ref 30.0–36.0)
MCV: 106.9 fL — ABNORMAL HIGH (ref 80.0–100.0)
Monocytes Absolute: 0.6 10*3/uL (ref 0.1–1.0)
Monocytes Relative: 10 %
Neutro Abs: 4.2 10*3/uL (ref 1.7–7.7)
Neutrophils Relative %: 65 %
Platelets: 297 10*3/uL (ref 150–400)
RBC: 2.88 MIL/uL — ABNORMAL LOW (ref 4.22–5.81)
RDW: 14.3 % (ref 11.5–15.5)
WBC: 6.4 10*3/uL (ref 4.0–10.5)
nRBC: 0 % (ref 0.0–0.2)

## 2019-01-22 LAB — COMPREHENSIVE METABOLIC PANEL
ALT: 10 U/L (ref 0–44)
AST: 23 U/L (ref 15–41)
Albumin: 3.8 g/dL (ref 3.5–5.0)
Alkaline Phosphatase: 30 U/L — ABNORMAL LOW (ref 38–126)
Anion gap: 6 (ref 5–15)
BUN: 19 mg/dL (ref 8–23)
CO2: 29 mmol/L (ref 22–32)
Calcium: 9.1 mg/dL (ref 8.9–10.3)
Chloride: 107 mmol/L (ref 98–111)
Creatinine, Ser: 0.99 mg/dL (ref 0.61–1.24)
GFR calc Af Amer: >60 mL/min (ref >60)
GFR calc non Af Amer: >60 mL/min (ref >60)
Glucose, Bld: 123 mg/dL — ABNORMAL HIGH (ref 70–99)
Potassium: 3.7 mmol/L (ref 3.5–5.1)
Sodium: 142 mmol/L (ref 135–145)
Total Bilirubin: 1 mg/dL (ref 0.3–1.2)
Total Protein: 6.6 g/dL (ref 6.5–8.1)

## 2019-01-22 LAB — RETICULOCYTES
Immature Retic Fract: 27 % — ABNORMAL HIGH (ref 2.3–15.9)
RBC.: 2.88 MIL/uL — ABNORMAL LOW (ref 4.22–5.81)
Retic Count, Absolute: 94.5 10*3/uL (ref 19.0–186.0)
Retic Ct Pct: 3.3 % — ABNORMAL HIGH (ref 0.4–3.1)

## 2019-01-22 NOTE — Telephone Encounter (Signed)
Spoke with representative at [employer]. Submitted PA for Northera. She said it has been submitted for clinical review and we will receive a fax with the outcome in about 72 hours.

## 2019-01-22 NOTE — Telephone Encounter (Signed)
Noted.  He will need to follow-up with GI for this to help determine if he needs a colonoscopy.  Please see if the patient has been contacted regarding this finding and if not I can speak with him.  If he has been contacted I can place a referral back to GI.  Thanks.

## 2019-01-22 NOTE — Telephone Encounter (Signed)
Please call regarding PA for [facility]

## 2019-01-22 NOTE — Telephone Encounter (Signed)
Spoke with [facility]. They still need prior authorization from insurance company. She gave me the information to call [place] at [phone] to submit prior auth.

## 2019-01-22 NOTE — Addendum Note (Signed)
Addended by: Caryl Bis, Mikael Debell G on: 01/22/2019 06:00 PM   Modules accepted: Orders

## 2019-01-22 NOTE — Telephone Encounter (Signed)
No patient has not been contacted concerning this finding when I spoke with his wife yesterday he was calling you for the results he was advised By [facility].

## 2019-01-22 NOTE — Telephone Encounter (Signed)
Spoke with patient.  He notes he had been advised of this.  Discussed this is likely a benign finding particularly given his relatively recent colonoscopy in 2017.  We will have him see his GI doctor determine if any further evaluation is needed.  Referral placed.

## 2019-01-23 LAB — HAPTOGLOBIN: Haptoglobin: <10 mg/dL — ABNORMAL LOW (ref 38–329)

## 2019-01-24 LAB — TRYPTASE: Tryptase: 12.5 ug/L (ref 2.2–13.2)

## 2019-01-25 ENCOUNTER — Telehealth: Payer: Medicare HMO | Admitting: Cardiovascular Disease

## 2019-01-25 ENCOUNTER — Other Ambulatory Visit: Payer: Self-pay

## 2019-01-25 LAB — COLD AGGLUTININ TITER: Cold Agglutinin Titer: 1:2048 {titer}

## 2019-01-25 MED ORDER — MIRTAZAPINE 15 MG PO TABS: 15.0000 mg | ORAL_TABLET | Freq: Every day | ORAL | 3 refills | Status: DC

## 2019-01-25 NOTE — Telephone Encounter (Signed)
Northera 100MG  has been approved for 1 year through 01/2020.

## 2019-01-26 ENCOUNTER — Telehealth: Payer: Self-pay | Admitting: Cardiovascular Disease

## 2019-01-26 NOTE — Telephone Encounter (Signed)
Please call regarding Northera PA. Reference key [id number]

## 2019-01-26 NOTE — Telephone Encounter (Signed)
Please see note below concerning Northera PA.

## 2019-01-26 NOTE — Telephone Encounter (Signed)
Spoke with Alver Fisher from [employer].  Made her aware that we have an Approval on this medication.  She stated she would update that in her system.

## 2019-01-28 ENCOUNTER — Other Ambulatory Visit: Payer: Self-pay | Admitting: *Deleted

## 2019-01-28 LAB — MISC LABCORP TEST (SEND OUT): Labcorp test code: 480940

## 2019-01-28 NOTE — Patient Outreach (Signed)
[facility]) Care Management  [place]  01/28/2019   William Taylor GLEED Mar 01, 1930 [id number]   Telephone outreach follow up    Referral source: [facility] case manager Referral reason: Complex care management services   Admission to [facility] 4/28-5/3, Dx : Dizziness, Orthostatic hypotension, Autonomic dysfunction , Anemia, chronic Anemia,   PMHX : includes but not limited to Atrial fib, Orthostatic hypotension, GI hemorrhage with melena, PE, CAD, stent placement,  Congestive heart failure , CKD.  Cold agglutinin disease.   Less than 30 day readmission to [facility] 5/10-5/17, Dx: Autonomic Orthostatic hypotension, abdominal pain, nausea .   Subjective:  Successful outreach call , able to speak with patient wife. She discussed that patient is doing great.  She discussed that his blood pressures have improved, he denies having any dizziness.  She discussed that patient continues to use walker for support.  Patient appetite is great ,denies any abdominal discomfort or nausea. Patient has  begin to put on weight regain strength .  She discussed patient having swelling in left foot , that usually stays swollen. Patient continues to wear support hose. Wife reports patient at one time was told okay to eat salt due to lower blood pressure , but now they have watch salt in diet as patient prior to date limited salt .   Wife discussed initial appointment at [facility] neurology has been cancelled, since patient has improved and remaining on current medication plan.    Encounter Medications:  Outpatient Encounter Medications as of 01/28/2019  Medication Sig Note  . amiodarone (PACERONE) 100 MG tablet Take 1 tablet (100 mg total) by mouth daily for 30 days.   . cyproheptadine (PERIACTIN) 4 MG tablet Take 4 mg by mouth 3 (three) times daily as needed.   . Droxidopa (NORTHERA) 100 MG CAPS Take 200 mg by mouth 3 (three) times daily.   . fenofibrate micronized (ANTARA) 130 MG capsule Take  130 mg by mouth daily before breakfast.   . finasteride (PROSCAR) 5 MG tablet Take 1 tablet (5 mg total) by mouth daily.   Marland Kitchen levobunolol (BETAGAN) 0.5 % ophthalmic solution Place 1 drop into the right eye 2 (two) times daily.    Marland Kitchen levothyroxine (SYNTHROID, LEVOTHROID) 50 MCG tablet Take 1 tablet (50 mcg total) by mouth daily at 6 (six) AM.   . Melatonin 3 MG CAPS Take 2 capsules by mouth every evening.   . midodrine (PROAMATINE) 10 MG tablet Take 1 tablet (10 mg total) by mouth 3 (three) times daily.   . mirtazapine (REMERON) 15 MG tablet Take 1 tablet (15 mg total) by mouth at bedtime.   . Multiple Vitamin (MULTIVITAMIN PO) Take 1 tablet by mouth daily.   . nitroGLYCERIN (NITROSTAT) 0.4 MG SL tablet DISSOLVE UNDER THE TONGUE 1 TABLET EVERY 5 MINUTES AS NEEDED FOR CHEST PAIN (Patient not taking: Reported on 01/15/2019) 12/21/2018: Has on hand   . ondansetron (ZOFRAN-ODT) 8 MG disintegrating tablet Take 0.5 tablets (4 mg total) by mouth every 8 (eight) hours as needed for nausea. (Patient not taking: Reported on 01/06/2019) 12/22/2018: Taking 55m 40 minutes before meals   . pantoprazole (PROTONIX) 40 MG tablet Take 1 tablet (40 mg total) by mouth 2 (two) times daily. (Patient taking differently: Take 40 mg by mouth daily. )   . pravastatin (PRAVACHOL) 40 MG tablet TAKE 1 TABLET BY MOUTH AT  BEDTIME   . pyridostigmine (MESTINON) 60 MG tablet Take 1 tablet (60 mg total) by mouth 2 (two) times daily  at 8 am and 4 pm for 30 days. (Patient not taking: Reported on 01/06/2019)   . QC NATURA-LAX powder MIX AND TAKE 17 GM (LINE ON INSIDE OF LID) TWICE DAILY AS NEEDED   . thiamine (VITAMIN B-1) 100 MG tablet Take 100 mg by mouth daily.   . Vitamin D, Ergocalciferol, (DRISDOL) 1.25 MG (50000 UT) CAPS capsule Take 1 capsule (50,000 Units total) by mouth every 7 (seven) days. 12/21/2018: Takes on Saturday.   Alveda Reasons 20 MG TABS tablet TAKE 1 TABLET BY MOUTH  DAILY    No facility-administered encounter medications on  file as of 01/28/2019.     Functional Status:  In your present state of health, do you have any difficulty performing the following activities: 01/06/2019 12/15/2018  Hearing? Y Y  Comment wife has taken hearing aide in for repair  -  Vision? N N  Difficulty concentrating or making decisions? N N  Walking or climbing stairs? Y Y  Comment using walker  -  Dressing or bathing? Y N  Comment wife helps  -  Doing errands, shopping? Y N  Comment wife helps  -  Conservation officer, nature and eating ? - -  Using the Toilet? Y -  Comment wife assist  -  In the past six months, have you accidently leaked urine? N -  Do you have problems with loss of bowel control? N -  Managing your Medications? Y -  Comment wife assist  -  Managing your Finances? Y -  Comment wife assist  -  Housekeeping or managing your Housekeeping? Y -  Comment wife handles  -  Some recent data might be hidden    Fall/Depression Screening: Fall Risk  01/06/2019 06/30/2018 06/26/2018  Falls in the past year? 1 0 0  Number falls in past yr: 1 - -  Injury with Fall? 1 - -  Risk for fall due to : History of fall(s) - -   PHQ 2/9 Scores 01/13/2019 06/26/2018 07/16/2017 06/28/2016  PHQ - 2 Score 0 0 0 0    Assessment:   Orthostatic hypotension  Improved reading recent reading , no dizziness  Lying  155/85 Sitting- 135/78 Standing- 126/69 3 minutes- 119/75  Nutrition Improved intake, with weight up to 200 lbs, patient had previously been down to 185.   Patient wife denies any new concerns at this time.  Emotional support to her related to recent death of her sister and brother in law.   Plan:  Will plan follow up call in the next month and consider transition to health coach if no new care coordination needs identified.   THN CM Care Plan Problem One     Most Recent Value  Care Plan Problem One  High risk for hospital readmission related to recent discharge , due to orthostatic hypotension , GI discomfort  [hospital  readmission ]  Role Documenting the Problem One  Care Management [place] for Problem One  Active  THN Long Term Goal   Patient will not experience a hospital admission over the next 60 days  [goal updated ]  THN Long Term Goal Start Date  12/21/18 [goal date restart  after admission ]  Interventions for Problem One Long Term Goal  Review of patient progress and positive reinforcement of continued daily monitoring of weights , blood pressure. Advised notifying MD of new concerns related to progress.   THN CM Short Term Goal #1   Over the next 30 days patient will  be able to report improvement in daily activity by participation in home exercise plan   [facility] CM Short Term Goal #1 Start Date  12/21/18  [facility] CM Short Term Goal #1 Met Date  01/28/19  THN CM Short Term Goal #2   Over the next 30 days patient/wife will continue to monitor blood pressure readings daily and keep a log .   THN CM Short Term Goal #2 Start Date  01/06/19 Barrie Folk restart ]  Interventions for Short Term Goal #2  Reviewed recent readings and reinforced sending record to [facility]       Joylene Draft, RN, [place] Management Coordinator  [phone]- Mobile [phone]- [place]

## 2019-01-29 ENCOUNTER — Other Ambulatory Visit: Payer: Self-pay | Admitting: Pharmacist

## 2019-01-29 NOTE — Patient Outreach (Signed)
William Taylor) Care Management [place]   01/29/2019  William Taylor [birth year] [id number]  Reason for referral: Medication Management support, post-discharge medication review  Referral source: [facility] Liaison Current insurance: [employer]  PMHx includes but not limited to:  Orthostatic hypotension, autonomic dysfunction, chronic atrial fibrillation, anemia, benign prostate hypertrophy  Outreach:  Successful telephone call with patient's wife.  HIPAA identifiers verified. She notes that patient is "doing so much better"; continues to have an improved appetite, mood, and no concerns with hypotension. She notes that after talking with Dr. Rockey Situ, they decided to cancel the neurology appointment because patient was doing so well.   She does note some increased lower extremity edema, so patient has been trying to prop up his feet more often. She notes that she will mention this to Dr. Rockey Situ the next time she calls in with blood pressure results.   She also asks about the benefit of mirtazapine. She notes that it was started at [facility], however, patient hadn't had it filled in the outpatient setting until earlier this week. She wonders if they could stop this medication.   Objective:  Lab Results  Component Value Date   CREATININE 0.99 01/22/2019   CREATININE 0.61 12/19/2018   CREATININE 0.67 12/18/2018    Lab Results  Component Value Date   HGBA1C 5.5 09/16/2018    Lipid Panel     Component Value Date/Time   CHOL 110 06/28/2016 1106   CHOL 119 10/27/2013 0609   TRIG 80.0 06/28/2016 1106   TRIG 61 10/27/2013 0609   HDL 29.90 (L) 06/28/2016 1106   HDL 38 (L) 10/27/2013 0609   CHOLHDL 4 06/28/2016 1106   VLDL 16.0 06/28/2016 1106   VLDL 12 10/27/2013 0609   LDLCALC 64 06/28/2016 1106   LDLCALC 69 10/27/2013 0609    BP Readings from Last 3 Encounters:  12/20/18 (!) 138/93  11/18/18 121/79  10/23/18 137/89    Allergies  Allergen Reactions   . Bee Venom Swelling  . Sulfasalazine Hives and Swelling  . Sulfa Antibiotics Hives and Swelling  . Warfarin And Related Other (See Comments)    Chest pain    Medications Reviewed Today    Reviewed by De Hollingshead, [facility] (Pharmacist) on 01/15/19 at 1330  Med List Status: <None>  Medication Order Taking? Sig Documenting Provider Last Dose Status Informant  amiodarone (PACERONE) 100 MG tablet [id number] Yes Take 1 tablet (100 mg total) by mouth daily for 30 days. Saundra Shelling, MD Taking Active   cyproheptadine (PERIACTIN) 4 MG tablet [id number] Yes Take 4 mg by mouth 3 (three) times daily as needed. [provider] Taking Active   Droxidopa ([place]) 100 MG CAPS [id number] Yes Take 400 mg by mouth 3 (three) times daily for 30 days.  Patient taking differently: Take 200 mg by mouth 3 (three) times daily.    Saundra Shelling, MD Taking Active            Med Note Darnelle Maffucci, Arville Lime   Fri Jan 15, 2019  1:22 PM)    fenofibrate micronized (ANTARA) 130 MG capsule [id number] Yes Take 130 mg by mouth daily before breakfast. [provider] Taking Active   finasteride (PROSCAR) 5 MG tablet [id number] Yes Take 1 tablet (5 mg total) by mouth daily. Abbie Sons, MD Taking Active Self  fludrocortisone (FLORINEF) 0.1 MG tablet [id number] Yes Take 2 tablets (0.2 mg total) by mouth daily for 30 days. Saundra Shelling, MD Taking Active  Med Note Darnelle Maffucci, Mkenzie Dotts E   Fri Jan 15, 2019  1:24 PM) Taking 1 tablet daily  levobunolol (BETAGAN) 0.5 % ophthalmic solution [id number] Yes Place 1 drop into the right eye 2 (two) times daily.  [provider] Taking Active Self  levothyroxine (SYNTHROID, LEVOTHROID) 50 MCG tablet [id number] Yes Take 1 tablet (50 mcg total) by mouth daily at 6 (six) AM. Salary, Avel Peace, MD Taking Active Self  Melatonin 3 MG CAPS [id number] Yes Take 2 capsules by mouth every evening. [provider] Taking Active   midodrine (PROAMATINE) 10  MG tablet [id number] Yes Take 1 tablet (10 mg total) by mouth 3 (three) times daily. [name], MD Taking Active Self  mirtazapine (REMERON) 15 MG tablet [id number] Yes Take 15 mg by mouth at bedtime. [provider] Taking Active   Multiple Vitamin (MULTIVITAMIN PO) [id number] Yes Take 1 tablet by mouth daily. [provider] Taking Active Spouse/Significant Other  nitroGLYCERIN (NITROSTAT) 0.4 MG SL tablet [id number] No DISSOLVE UNDER THE TONGUE 1 TABLET EVERY 5 MINUTES AS NEEDED FOR CHEST PAIN  Patient not taking: Reported on 01/15/2019   [name], MD Not Taking Active Self           Med Note Joylene Draft A   Mon Dec 21, 2018  3:02 PM) Has on hand   ondansetron (ZOFRAN-ODT) 8 MG disintegrating tablet [id number] No Take 0.5 tablets (4 mg total) by mouth every 8 (eight) hours as needed for nausea.  Patient not taking: Reported on 01/06/2019   Jodelle Green, FNP Not Taking Active Self           Med Note Erling Cruz, ANNA K   Tue Dec 22, 2018  4:42 PM) Taking 8mg  40 minutes before meals   pantoprazole (PROTONIX) 40 MG tablet [id number] Yes Take 1 tablet (40 mg total) by mouth 2 (two) times daily.  Patient taking differently: Take 40 mg by mouth daily.    [name], MD Taking Active Self  pravastatin (PRAVACHOL) 40 MG tablet [id number] Yes TAKE 1 TABLET BY MOUTH AT  BEDTIME [name], MD Taking Active Self  pyridostigmine (MESTINON) 60 MG tablet [id number] No Take 1 tablet (60 mg total) by mouth 2 (two) times daily at 8 am and 4 pm for 30 days.  Patient not taking: Reported on 01/06/2019   Saundra Shelling, MD Not Taking Active            Med Note De Hollingshead   Fri Jan 15, 2019  1:29 PM)    QC NATURA-LAX powder [id number] Yes MIX AND TAKE 17 GM (LINE ON INSIDE OF LID) TWICE DAILY AS NEEDED [name], MD Taking Active Self  ranolazine (RANEXA) 500 MG 12 hr tablet [id number] No Take 1 tablet (500 mg total) by mouth 2 (two) times daily.  Patient not  taking: Reported on 01/06/2019   [name], MD Not Taking Active Self  thiamine (VITAMIN B-1) 100 MG tablet [id number] Yes Take 100 mg by mouth daily. [provider] Taking Active   Vitamin D, Ergocalciferol, (DRISDOL) 1.25 MG (50000 UT) CAPS capsule [id number] Yes Take 1 capsule (50,000 Units total) by mouth every 7 (seven) days. Saundra Shelling, MD Taking Active            Med Note Jonathon Jordan Dec 21, 2018  3:07 PM) Takes on Saturday.   XARELTO 20 MG TABS tablet [id number] Yes TAKE 1 TABLET BY MOUTH  DAILY Gollan,  Kathlene November, MD Taking Active Self          Assessment: Medication Review Findings:  . Discussed benefit of mirtazapine with mood, appetite, and sleep; however, it appears that patient's clinical picture improved mostly independent of mirtazapine therapy. Counseled wife that they could consider d/c this medication, and to feel free to discuss with Dr. Caryl Bis.     Plan: . No pharmacy related needs identified at this time. [name] has my contact information for any future questions or concerns. Will close pharmacy case at this time.   Catie Darnelle Maffucci, PharmD, [place] PGY2 Ambulatory Care Pharmacy Resident, [facility] Phone: [phone]

## 2019-01-30 NOTE — Progress Notes (Signed)
Called pt' s house and spoke to wife and told her that hgb 9.9 and it is hangling around 10 and Dr. Janese Banks is good with this number and he should f/u in office with labs and see Dr. Janese Banks in [place]. When his next appt is. She was thankful and he is doing good. He has some fluid in his feet and she is going to call the doctor office on Monday about it

## 2019-02-01 ENCOUNTER — Telehealth: Payer: Self-pay | Admitting: *Deleted

## 2019-02-01 DIAGNOSIS — E559 Vitamin D deficiency, unspecified: Secondary | ICD-10-CM | POA: Diagnosis not present

## 2019-02-01 DIAGNOSIS — E538 Deficiency of other specified B group vitamins: Secondary | ICD-10-CM | POA: Diagnosis not present

## 2019-02-01 NOTE — Telephone Encounter (Signed)
I had called and spoke to wife of pt. And let her know that hgb 9.9 and it is close to 10 and Dr. Janese Banks ok with it. She states he is still doing good. He has some swelling in feet and she is going to call PCP next week to check on it.

## 2019-02-03 DIAGNOSIS — I951 Orthostatic hypotension: Secondary | ICD-10-CM | POA: Diagnosis not present

## 2019-02-03 DIAGNOSIS — R42 Dizziness and giddiness: Secondary | ICD-10-CM | POA: Diagnosis not present

## 2019-02-07 ENCOUNTER — Other Ambulatory Visit: Payer: Self-pay | Admitting: Family Medicine

## 2019-02-09 DIAGNOSIS — E538 Deficiency of other specified B group vitamins: Secondary | ICD-10-CM | POA: Diagnosis not present

## 2019-02-15 ENCOUNTER — Ambulatory Visit: Payer: Medicare HMO | Admitting: Gastroenterology

## 2019-02-15 ENCOUNTER — Encounter: Payer: Self-pay | Admitting: Gastroenterology

## 2019-02-15 ENCOUNTER — Other Ambulatory Visit: Payer: Self-pay

## 2019-02-15 VITALS — BP 181/88 | HR 73 | Ht 76.0 in | Wt 206.6 lb

## 2019-02-15 DIAGNOSIS — R634 Abnormal weight loss: Secondary | ICD-10-CM

## 2019-02-15 DIAGNOSIS — K921 Melena: Secondary | ICD-10-CM

## 2019-02-15 DIAGNOSIS — R933 Abnormal findings on diagnostic imaging of other parts of digestive tract: Secondary | ICD-10-CM

## 2019-02-15 NOTE — Progress Notes (Signed)
[name] MD, MRCP(U.K) [address]  [place]  [place], [place] [address]  Main: [phone]  Fax: [phone]   Primary Care Physician: [name], MD  Primary Gastroenterologist:  Dr. [name]   Iron deficiency anemia   HPI: William Taylor is a 85 y.o. male   Summary of history :  He has a history of CAD,stent placment in 2005 , Atrial fibrillation 2011, On anticoagulation . Atavisit to see his cardiologist Dr Rockey Situ , reported symptoms of shortness of breath, fatigue ,exertion . He was noted to have a drop in his Hb , stool was positive for blood . He wason xarelto. He has external hemorroids. 06/2016 Ferritin 9 , TIBC elevated. 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 10/03/16- capsule study showedno bleeding but a few small polyps were seen . 09/2017 : iron studies were normal .11/2017 b12 and folate normal. Seen Dr Janese Banks Hematology - diagnosed with cold agglutinin hemolytic anemia Admitted 09/20/2018 with GI bleed , postural hypotension,melena .  09/22/2018 : Push enteroscopy -1 cm hiatal hernia.  09/30/2018: Hb 11.5 grams   Interval history 10/07/2018 -02/15/2019   01/22/2019: Hb 9.9 grams with MCV 106   Last visit had poor apetite and was suggested Remeron by Dr Caryl Bis and was noted to have orthostatic drop in BP with symptoms of dizziness.   Has been following up with cardiology.   Today 206 lb , gained 2 lbs since 09/2018. BP 181/88.  Good apetite, no diziness. Only reason he says he is here today is to discuss CT abdomen results from [facility] which he was told needed a follow up. I reviewed the report of the CT scan from 12/27/2018 and there was some mention about thickening of the wall of the cecum   His last colonoscopy was in 2017 by myself  which showed no abnormality of the cecum and we have pictures. He has no pain in the RLQ   Current Outpatient Medications  Medication Sig Dispense Refill  . amiodarone (PACERONE) 100 MG tablet Take 1 tablet (100 mg total) by mouth daily for 30 days. 30 tablet 0  . cyproheptadine (PERIACTIN) 4 MG tablet Take 4 mg by mouth 3 (three) times daily as needed.    . Droxidopa (NORTHERA) 100 MG CAPS Take 200 mg by mouth 3 (three) times daily.    . fenofibrate micronized (ANTARA) 130 MG capsule TAKE 1 CAPSULE BY MOUTH  DAILY BEFORE BREAKFAST 90 capsule 0  . finasteride (PROSCAR) 5 MG tablet Take 1 tablet (5 mg total) by mouth daily. 90 tablet 2  . levobunolol (BETAGAN) 0.5 % ophthalmic solution Place 1 drop into the right eye 2 (two) times daily.     Marland Kitchen levothyroxine (SYNTHROID, LEVOTHROID) 50 MCG tablet Take 1 tablet (50 mcg total) by mouth daily at 6 (six) AM. 90 tablet 0  . Melatonin 3 MG CAPS Take 2 capsules by mouth every evening.    . midodrine (PROAMATINE) 10 MG tablet Take 1 tablet (10 mg total) by mouth 3 (three) times daily. 90 tablet 3  . mirtazapine (REMERON) 15 MG tablet Take 1 tablet (15 mg total) by mouth at bedtime. 30 tablet 3  . Multiple Vitamin (MULTIVITAMIN PO) Take 1 tablet by mouth daily.    . nitroGLYCERIN (NITROSTAT) 0.4 MG SL tablet DISSOLVE UNDER THE TONGUE 1 TABLET  EVERY 5 MINUTES AS NEEDED FOR CHEST PAIN (Patient not taking: Reported on 01/15/2019) 25 tablet 3  . ondansetron (ZOFRAN-ODT) 8 MG disintegrating tablet Take 0.5 tablets (4 mg total) by mouth every 8 (eight) hours as needed for nausea. (Patient not taking: Reported on 01/06/2019) 20 tablet 1  . pantoprazole (PROTONIX) 40 MG tablet Take 1 tablet (40 mg total) by mouth 2 (two) times daily. (Patient taking differently: Take 40 mg by mouth daily. ) 60 tablet 2  . pravastatin (PRAVACHOL) 40 MG tablet TAKE 1 TABLET BY MOUTH AT  BEDTIME 90 tablet 3  . pyridostigmine (MESTINON) 60 MG tablet Take 1 tablet (60 mg total) by mouth 2  (two) times daily at 8 am and 4 pm for 30 days. (Patient not taking: Reported on 01/06/2019) 60 tablet 0  . QC NATURA-LAX powder MIX AND TAKE 17 GM (LINE ON INSIDE OF LID) TWICE DAILY AS NEEDED 238 g 0  . thiamine (VITAMIN B-1) 100 MG tablet Take 100 mg by mouth daily.    Alveda Reasons 20 MG TABS tablet TAKE 1 TABLET BY MOUTH  DAILY 90 tablet 0   No current facility-administered medications for this visit.     Allergies as of 02/15/2019 - Review Complete 01/15/2019  Allergen Reaction Noted  . Bee venom Swelling 07/17/2015  . Sulfasalazine Hives and Swelling 01/22/2016  . Sulfa antibiotics Hives and Swelling 01/22/2016  . Warfarin and related Other (See Comments) 05/18/2012    ROS:  General: Negative for anorexia, weight loss, fever, chills, fatigue, weakness. ENT: Negative for hoarseness, difficulty swallowing , nasal congestion. CV: Negative for chest pain, angina, palpitations, dyspnea on exertion, peripheral edema.  Respiratory: Negative for dyspnea at rest, dyspnea on exertion, cough, sputum, wheezing.  GI: See history of present illness. GU:  Negative for dysuria, hematuria, urinary incontinence, urinary frequency, nocturnal urination.  Endo: Negative for unusual weight change.    Physical Examination:   There were no vitals taken for this visit.  General: Well-nourished, well-developed in no acute distress.  Eyes: No icterus. Conjunctivae pink. Mouth: Oropharyngeal mucosa moist and pink , no lesions erythema or exudate. Lungs: Clear to auscultation bilaterally. Non-labored. Heart: Regular rate and rhythm, no murmurs rubs or gallops.  Abdomen: Bowel sounds are normal, nontender, nondistended, no hepatosplenomegaly or masses, no abdominal bruits or hernia , no rebound or guarding.   Extremities: No lower extremity edema. No clubbing or deformities. Neuro: Alert and oriented x 3.  Grossly intact. Skin: Warm and dry, no jaundice.   Psych: Alert and cooperative, normal mood and  affect.   Imaging Studies: No results found.  Assessment and Plan:   William Taylor is a 85 y.o. y/o male  here to see me for iron deficiency anemia. At his last visit main issue was diziness and low blood pressure which have resolved. Here today to discuss results of recent CT scan at [facility] which showed incidentally thickening of the cecum. Last colonoscopy in 2017 performed by myself showed no lesions except an AVM that I cauterized.   Plan  1. Discussed with Dr Delanna Ahmadi due to cold agglutinin disease and avoiding any Rituxan due to COVID for now. .2. Abnormal CT: provided options a) do nothing b) proceed with colonoscopy but increased risk due to age, fraility , issues with blood pressure and complications , c) repeat CT scan and consider colonoscopy if still abnormal. He chose option C)    Dr [name]  MD,MRCP [facility]) Follow up in 4 weeks

## 2019-02-15 NOTE — Telephone Encounter (Signed)
William Merritts, MD to [facility] Triage      10:10 AM Can we run through his med list.  Mestinon on his list, but don't think he is taking   Would confirm dose of florinef. If taking florinef 0.2 daily, would consider decreasing down to 0.1 mg daily. This can contribute to leg swelling. Will try to avoid lasix 20 mg po daily PRN for swelling, but might need to take very sparingly for leg swelling.   Thx  TG

## 2019-02-15 NOTE — Telephone Encounter (Signed)
Spoke with patient's wife, ok per DPR. We reviewed patient's med list and updated it. Patient not currently on florinef since before admission to [facility]. Patient is not currently on lasix at this time either. She says the tops of patient's feet are "spongy." Patient wears compression stockings. Encouraged elevation and will get back with them once I confirm if Dr Rockey Situ wants to order furosemide prn.

## 2019-02-16 ENCOUNTER — Other Ambulatory Visit: Payer: Self-pay

## 2019-02-16 DIAGNOSIS — R933 Abnormal findings on diagnostic imaging of other parts of digestive tract: Secondary | ICD-10-CM

## 2019-02-17 ENCOUNTER — Other Ambulatory Visit: Payer: Self-pay | Admitting: Gastroenterology

## 2019-03-02 ENCOUNTER — Other Ambulatory Visit: Payer: Self-pay | Admitting: *Deleted

## 2019-03-02 ENCOUNTER — Encounter: Payer: Self-pay | Admitting: *Deleted

## 2019-03-02 NOTE — Patient Outreach (Signed)
[facility]) Care Management  [facility] Care Manager  03/02/2019   William Taylor [birth year] [id number]   Telephone assessment   Patient is 85 year old male with  Admission to [facility] 4/28-5/3, Dx : Dizziness, Orthostatic hypotension, Autonomic dysfunction , Anemia, chronic Anemia,  PMHX : includes but not limited to Atrial fib, Orthostatic hypotension, GI hemorrhage with melena, PE, CAD, stent placement, Congestive heart failure , CKD. Cold agglutinin disease.  Less than 30 day readmission to [facility] 5/10-5/17, Dx: Autonomic Orthostatic hypotension, abdominal pain, nausea .   Subjective:   Successful outreach call to patient wife , HIPAA confirmed by 2 identifiers.  Wife reports that patient has being doing fairly well.   She further discussed : Orthostatic hypotension  She reports not checking readings as much since reading have been staying about the same and not dropping. She discussed patient denies having any dizziness , but tries easily. She discussed recent reading in the last week.  Lying : 167/93 Sitting: 143/83 Standing : 143/75 3 min : 138/82 Nutrition  She discussed patient appetite has improved, he is eating well , no complaint of nausea . She reports patient has not had any weight loss, and that he continues to monitor daily recalling recent weight at 200.  She discussed patient with CT scan of Abd scheduled for 7/15, to follow up on finding while he was at [facility] . Atrial fib She discussed patient long  history of atrial fib  states he can't tell he is in it . Reviewed symptoms to identify , shortness of breath, weakness, heart palpitations, she denies patient complaint . She discussed patient taking all  medications as prescribed. Discussed sending educational handout for review. Lower leg swelling  Wife discussed patient in the last week noting patient episode of lower leg swelling . She discussed patient wears support hose daily, and right leg is  always a little swollen due history of injury.  Discussed reason for lower legs swelling , increased sodium in diet can contribute to swelling, sitting long periods with legs dangling . Discussed with wife when noted increase swelling to monitor weight for sudden increases of 2 pounds in a day or 5 in a week. She reports swelling has improved some now.  Wife discussed patient initially after discharge from [place] increased salt in diet, but now he does not add salt, or eat fast food on regular basis.   Objective:   Encounter Medications:  Outpatient Encounter Medications as of 03/02/2019  Medication Sig Note  . amiodarone (PACERONE) 100 MG tablet Take 1 tablet (100 mg total) by mouth daily for 30 days.   . cyproheptadine (PERIACTIN) 4 MG tablet Take 4 mg by mouth 3 (three) times daily as needed.   . Droxidopa (NORTHERA) 100 MG CAPS Take 200 mg by mouth 3 (three) times daily.   . fenofibrate micronized (ANTARA) 130 MG capsule TAKE 1 CAPSULE BY MOUTH  DAILY BEFORE BREAKFAST   . finasteride (PROSCAR) 5 MG tablet Take 1 tablet (5 mg total) by mouth daily.   Marland Kitchen levobunolol (BETAGAN) 0.5 % ophthalmic solution Place 1 drop into the right eye 2 (two) times daily.    Marland Kitchen levothyroxine (SYNTHROID, LEVOTHROID) 50 MCG tablet Take 1 tablet (50 mcg total) by mouth daily at 6 (six) AM.   . Melatonin 3 MG CAPS Take 2 capsules by mouth every evening.   . midodrine (PROAMATINE) 10 MG tablet Take 1 tablet (10 mg total) by mouth 3 (three) times daily.   Marland Kitchen  mirtazapine (REMERON) 15 MG tablet Take 1 tablet (15 mg total) by mouth at bedtime.   . Multiple Vitamin (MULTIVITAMIN PO) Take 1 tablet by mouth daily.   . nitroGLYCERIN (NITROSTAT) 0.4 MG SL tablet DISSOLVE UNDER THE TONGUE 1 TABLET EVERY 5 MINUTES AS NEEDED FOR CHEST PAIN 12/21/2018: Has on hand   . pantoprazole (PROTONIX) 40 MG tablet TAKE ONE TABLET BY MOUTH TWICE DAILY   . pravastatin (PRAVACHOL) 40 MG tablet TAKE 1 TABLET BY MOUTH AT  BEDTIME   . QC NATURA-LAX  powder MIX AND TAKE 17 GM (LINE ON INSIDE OF LID) TWICE DAILY AS NEEDED   . thiamine (VITAMIN B-1) 100 MG tablet Take 100 mg by mouth daily.   Alveda Reasons 20 MG TABS tablet TAKE 1 TABLET BY MOUTH  DAILY    No facility-administered encounter medications on file as of 03/02/2019.     Functional Status:  In your present state of health, do you have any difficulty performing the following activities: 01/06/2019 12/15/2018  Hearing? Y Y  Comment wife has taken hearing aide in for repair  -  Vision? N N  Difficulty concentrating or making decisions? N N  Walking or climbing stairs? Y Y  Comment using walker  -  Dressing or bathing? Y N  Comment wife helps  -  Doing errands, shopping? Y N  Comment wife helps  -  Conservation officer, nature and eating ? - -  Using the Toilet? Y -  Comment wife assist  -  In the past six months, have you accidently leaked urine? N -  Do you have problems with loss of bowel control? N -  Managing your Medications? Y -  Comment wife assist  -  Managing your Finances? Y -  Comment wife assist  -  Housekeeping or managing your Housekeeping? Y -  Comment wife handles  -  Some recent data might be hidden    Fall/Depression Screening: Fall Risk  01/06/2019 06/30/2018 06/26/2018  Falls in the past year? 1 0 0  Number falls in past yr: 1 - -  Injury with Fall? 1 - -  Risk for fall due to : History of fall(s) - -   PHQ 2/9 Scores 01/13/2019 06/26/2018 07/16/2017 06/28/2016  PHQ - 2 Score 0 0 0 0    Assessment:  Will continue to follow for complex care management of chronic conditions, reinforce education on blood pressure monitoring, chronic atrial fib .  Plan:  Will send [facility] atrial fib EMMI handout .  Will plan return call in the next month and if no new care coordination needs will consider transition to health coach .   THN CM Care Plan Problem One     Most Recent Value  Care Plan Problem One  High risk for hospital readmission related to recent discharge , due to  orthostatic hypotension , GI discomfort  [hospital readmission ]  Role Documenting the Problem One  Care Management [place] for Problem One  Active  THN Long Term Goal   Patient will not experience a hospital admission over the next 75 days   THN Long Term Goal Start Date  12/21/18 Barrie Folk date restart  after admission ]  Interventions for Problem One Long Term Goal  Discussed current clinical conditon, reviewed concern related to leg swelling , and contine to notify MD of concerns .   THN CM Short Term Goal #1   Over the next 30 days patient will be able to report  improvement in daily activity by participation in home exercise plan   [facility] CM Short Term Goal #1 Start Date  12/21/18  [facility] CM Short Term Goal #1 Met Date  01/28/19  THN CM Short Term Goal #2   Over the next 30 days patient/wife will continue to monitor blood pressure readings at least twice weekly , keep a log .   THN CM Short Term Goal #2 Start Date  01/06/19 [goal updated ]  Interventions for Short Term Goal #2  Discussed importance of keeping track of blood pressure at home , and if symptoms noted. And contacting MD of blood pressure changes , higher or lower especially it other symptoms noted.    THN CM Short Term Goal #3  Over the next 20 days patient will be able to report decrease in swelling in legs.   THN CM Short Term Goal #3 Start Date  03/02/19  Interventions for Short Tern Goal #3  Reviewed concerns of leg swelling, and some reasons, increased use of salt in diet and explained how salt causes body to hold on to more fluid, reviewed hiigh salt food,,. Discussed how keeping legs dangling when sitting long periods may contribute to swelling encouraged elevated legs through out the day when sitting. Discussed continuing to monitor weight for sudden increases        Joylene Draft, RN, [place] Management Coordinator  [phone]- Mobile [phone]- [place]

## 2019-03-03 ENCOUNTER — Other Ambulatory Visit: Payer: Self-pay

## 2019-03-03 ENCOUNTER — Ambulatory Visit
Admission: RE | Admit: 2019-03-03 | Discharge: 2019-03-03 | Disposition: A | Discharge status: Home / Self Care | Payer: Medicare HMO | Source: Ambulatory Visit | Attending: Gastroenterology | Admitting: Gastroenterology

## 2019-03-03 DIAGNOSIS — K5730 Diverticulosis of large intestine without perforation or abscess without bleeding: Secondary | ICD-10-CM | POA: Diagnosis not present

## 2019-03-03 DIAGNOSIS — R933 Abnormal findings on diagnostic imaging of other parts of digestive tract: Secondary | ICD-10-CM | POA: Diagnosis not present

## 2019-03-04 ENCOUNTER — Telehealth: Payer: Self-pay

## 2019-03-04 NOTE — Telephone Encounter (Signed)
Called pt to inform him of CT scan results.   Unable to contact, LVM to return call

## 2019-03-04 NOTE — Telephone Encounter (Signed)
-----   Message from [name], MD sent at 03/04/2019  9:04 AM EDT ----- Inform CT shows no acute abnormalities

## 2019-03-04 NOTE — Progress Notes (Signed)
Inform CT shows no acute abnormalities

## 2019-03-04 NOTE — Telephone Encounter (Signed)
Patient returned call to [facility].

## 2019-03-05 ENCOUNTER — Encounter: Payer: Self-pay | Admitting: Family Medicine

## 2019-03-08 NOTE — Telephone Encounter (Signed)
Called pt to inform him of CT scan results.  Unable to contact, left detailed VM

## 2019-03-16 ENCOUNTER — Telehealth: Payer: Self-pay

## 2019-03-16 NOTE — Telephone Encounter (Signed)
Called and informed patient his handicap placard application is ready for pick up, pt understood.  William Taylor,cma

## 2019-03-23 ENCOUNTER — Other Ambulatory Visit: Payer: Self-pay | Admitting: *Deleted

## 2019-03-23 NOTE — Patient Outreach (Signed)
[facility]) Care Management  03/23/2019  William Taylor [birth year] [id number]  Telephone assessment    Patient is 85 year old male with  Admission to [facility] 4/28-5/3, Dx : Dizziness, Orthostatic hypotension, Autonomic dysfunction , Anemia, chronic Anemia,  PMHX : includes but not limited to Atrial fib, Orthostatic hypotension, GI hemorrhage with melena, PE, CAD, stent placement, Congestive heart failure , CKD. Cold agglutinin disease.  Less than 30 day readmission to [facility] 5/10-5/17, Dx: Autonomic Orthostatic hypotension, abdominal pain, nausea.  Subjective Outreach call to patient, his wife answers the phone, HIPAA identifiers verified x2, she states that they are unable to talk at this time  request call later date.   Plan  Will plan return call in the next 4 business days.    Joylene Draft, RN, [place] Management Coordinator  [phone]- Mobile [phone]- Toll Free Main Office

## 2019-03-26 ENCOUNTER — Other Ambulatory Visit: Payer: Self-pay | Admitting: *Deleted

## 2019-03-26 NOTE — Patient Outreach (Signed)
[facility]) Care Management  [place]  03/26/2019   VON QUINTANAR 1929/11/14 [id number]    Patient is 85 year old male with Admission to [facility] 4/28-5/3, Dx : Dizziness, Orthostatic hypotension, Autonomic dysfunction , Anemia, chronic Anemia,  PMHX : includes but not limited to Atrial fib, Orthostatic hypotension, GI hemorrhage with melena, PE, CAD, stent placement, Congestive heart failure , CKD. Cold agglutinin disease.  Less than 30 day readmission to [facility] 5/10-5/17, Dx: Autonomic Orthostatic hypotension, abdominal pain, nausea.   Subjective:  Successful outreach call to patient , able to speak with her wife, Jacier Gladu, she states patient is no available to talk.  She discussed that patient is doing much better, stronger, he has graduated from the walker to the cane. She reports that he  tolerating mobility around home, denies dizziness , he is able to drive short distances and they now have a handicap placard that really helps.   She discussed patient has improved, noting no swelling on today she states patient has new compression hose and that has helped.  Patient continues to tolerate diet heart healthy balanced diet  without nausea,his weight remains at 200 , no reoccurrence of weight loss and no sudden weight gain.    Encounter Medications:  Outpatient Encounter Medications as of 03/26/2019  Medication Sig Note  . amiodarone (PACERONE) 100 MG tablet Take 1 tablet (100 mg total) by mouth daily for 30 days.   . cyproheptadine (PERIACTIN) 4 MG tablet Take 4 mg by mouth 3 (three) times daily as needed.   . Droxidopa (NORTHERA) 100 MG CAPS Take 200 mg by mouth 3 (three) times daily.   . fenofibrate micronized (ANTARA) 130 MG capsule TAKE 1 CAPSULE BY MOUTH  DAILY BEFORE BREAKFAST   . finasteride (PROSCAR) 5 MG tablet Take 1 tablet (5 mg total) by mouth daily.   Marland Kitchen levobunolol (BETAGAN) 0.5 % ophthalmic solution Place 1 drop into the right eye 2  (two) times daily.    Marland Kitchen levothyroxine (SYNTHROID, LEVOTHROID) 50 MCG tablet Take 1 tablet (50 mcg total) by mouth daily at 6 (six) AM.   . Melatonin 3 MG CAPS Take 2 capsules by mouth every evening.   . midodrine (PROAMATINE) 10 MG tablet Take 1 tablet (10 mg total) by mouth 3 (three) times daily.   . mirtazapine (REMERON) 15 MG tablet Take 1 tablet (15 mg total) by mouth at bedtime.   . Multiple Vitamin (MULTIVITAMIN PO) Take 1 tablet by mouth daily.   . nitroGLYCERIN (NITROSTAT) 0.4 MG SL tablet DISSOLVE UNDER THE TONGUE 1 TABLET EVERY 5 MINUTES AS NEEDED FOR CHEST PAIN 12/21/2018: Has on hand   . pantoprazole (PROTONIX) 40 MG tablet TAKE ONE TABLET BY MOUTH TWICE DAILY   . pravastatin (PRAVACHOL) 40 MG tablet TAKE 1 TABLET BY MOUTH AT  BEDTIME   . QC NATURA-LAX powder MIX AND TAKE 17 GM (LINE ON INSIDE OF LID) TWICE DAILY AS NEEDED   . thiamine (VITAMIN B-1) 100 MG tablet Take 100 mg by mouth daily.   Alveda Reasons 20 MG TABS tablet TAKE 1 TABLET BY MOUTH  DAILY    No facility-administered encounter medications on file as of 03/26/2019.     Functional Status:  In your present state of health, do you have any difficulty performing the following activities: 01/06/2019 12/15/2018  Hearing? Y Y  Comment wife has taken hearing aide in for repair  -  Vision? N N  Difficulty concentrating or making decisions? N N  Walking or climbing stairs? Y Y  Comment using walker  -  Dressing or bathing? Y N  Comment wife helps  -  Doing errands, shopping? Y N  Comment wife helps  -  Using the Toilet? Y -  Comment wife assist  -  In the past six months, have you accidently leaked urine? N -  Do you have problems with loss of bowel control? N -  Managing your Medications? Y -  Comment wife assist  -  Managing your Finances? Y -  Comment wife assist  -  Housekeeping or managing your Housekeeping? Y -  Comment wife handles  -  Some recent data might be hidden    Fall/Depression Screening: Fall Risk   01/06/2019 06/30/2018 06/26/2018  Falls in the past year? 1 0 0  Number falls in past yr: 1 - -  Injury with Fall? 1 - -  Risk for fall due to : History of fall(s) - -   PHQ 2/9 Scores 01/13/2019 06/26/2018 07/16/2017 06/28/2016  PHQ - 2 Score 0 0 0 0    Assessment:   Orthostatic hypotension is better controlled, recent readings Lying 174/106 Siting 141/81 Standing 139/83 Reports no dizziness, no falls using a cane.  Nutrition Improved appetite,eating well  no nausea no weight loss.  Leg Swelling  Improvement , consistent with wearing support hose, elevating legs while sitting on and off during the day . Reiterated worsening of symptoms to notify MD of sudden weight gain of 3 pounds in a day, 5 in a week, shortness wife acknowledges action plan of notifying MD .  Atrial Fib history - review of signs and symptoms of atrial fib and action plan patient without symptoms able to recall action plan of notifying MD, she again states patient does not have any symptoms    Discussed with caregiver patient she agreed  current goals met, reviewed next step for continued education and support with telephonic health coach, wife states that we are doing just fine and have everything under control, she declined need and is not in agreement for continued outreaches at this time, she states patient would not want to be talk over the phone. Confirmed that she has my contact number if new concerns arise. Encouraged to continue follow up with medical team as recommended and continue monitoring patient orthostatic blood pressure reading and daily weight and notify MD of concerns .     Plan Will close patient case ,patient centered  goals met Will send PCP case closure letter .    Joylene Draft, RN, [place] Management Coordinator  [phone]- Mobile [phone]- Toll Free Main Office

## 2019-03-30 DIAGNOSIS — M5134 Other intervertebral disc degeneration, thoracic region: Secondary | ICD-10-CM | POA: Diagnosis not present

## 2019-03-30 DIAGNOSIS — M9902 Segmental and somatic dysfunction of thoracic region: Secondary | ICD-10-CM | POA: Diagnosis not present

## 2019-03-31 ENCOUNTER — Telehealth: Payer: Self-pay | Admitting: Family Medicine

## 2019-03-31 NOTE — Telephone Encounter (Signed)
Pt was seen by Dr. Brigitte Pulse, provider wants pt to start taking B12 injections again. Can pt be scheduled her to have his B12's done?

## 2019-04-01 NOTE — Telephone Encounter (Signed)
Pt was seen by Dr. Brigitte Pulse, provider wants pt to start taking B12 injections again. Can pt be scheduled her to have his B12's done?

## 2019-04-01 NOTE — Telephone Encounter (Signed)
I am fine with this though can you contact Dr Trena Platt office [facility] neurology) to confirm what schedule they want him to have for these. Thanks.

## 2019-04-05 ENCOUNTER — Other Ambulatory Visit: Payer: Self-pay | Admitting: *Deleted

## 2019-04-05 DIAGNOSIS — R600 Localized edema: Secondary | ICD-10-CM | POA: Diagnosis not present

## 2019-04-05 DIAGNOSIS — N189 Chronic kidney disease, unspecified: Secondary | ICD-10-CM | POA: Diagnosis not present

## 2019-04-05 NOTE — Patient Outreach (Signed)
[facility] [facility]) Care Management  04/05/2019  William Taylor [birth year] [id number]   Telephone assessment   Incoming call from patient wife, William Taylor she discussed concern regarding patient recent recommendations from [place] office to  begin B12 injections and she wasn't sure who to call for help.  She reports not getting these set up before leaving his office at visit. She has spoken with Dr.Shah office and prefers injections to be done at PCP office .   Wife requesting assistance  in coordinating B12 injection appointment at [place] office .  She is unsure what next step to take. She is agreeable to [facility] care coordinator involvement with assisting with matter.  Placed call to PCP office spoke with representative , she reports office has contacted [place] office on today regarding schedule of B12 injections. She recommended to return call in the next few days once they have MD recommended schedule for injections to schedule office visit.  Explained that patient wife reports patient has very low energy. She discussed patient recent blood pressures  Lying 165/87 Sit 158/83 Standing 152/83 17min 138/85.  She reports patient continues with good appetite, no weight loss, weight is at 200 lbs. She denies patient with complaint of shortness of breath or increase in swelling.  She reports patient having a visit with Dr.Singh on today and he has driven himself as he has been able to drive short distances.   Wife reports discussed being unsure if patient is to be on b12 pill also.  I placed call to [place] office to verify as she reports he recently received  b12 injections at office and to continue  and some mention of pills also.  Spoke with Nevin Bloodgood she will forward message to nurse for clarification and return call  Received return call from [place] at [place] office verifying patient instructions at recent visit recommends vitamin B12 1000 mcg daily  along with injections as  prescribed. She discussed contacting Dr.Sonneberg office regarding B12 injection schedule.  1500 Unsuccessful return call to patient wife, able to HIPAA compliant message for return call .  Plan Will schedule return call to patient /wife in the next 2 business days and assess for additional care coordination  management needs to reestablish care .    Joylene Draft, RN, [place] Management Coordinator  [phone]- Mobile [phone]- Toll Free Main Office

## 2019-04-05 NOTE — Telephone Encounter (Signed)
Received a call from Dr. Trena Platt office and he wants the patient to do a B12 injection once a week for 4 weeks, at 1000 mg, then once a month for 4 months and he is also taking a pill  For B12 , that they gave him and he is aware.  Nina,cma

## 2019-04-05 NOTE — Telephone Encounter (Signed)
Noted. That regimen is good with me. He can be set up for B12 1000 mcg injections once weekly for 4 weeks and then once monthly for 4 months. Please get him scheduled for a nurse visit for this. Thanks.

## 2019-04-05 NOTE — Telephone Encounter (Signed)
I called Dr. Trena Platt office and a message was left for his nurse to return a call to me.  Helen Winterhalter,cma

## 2019-04-06 NOTE — Telephone Encounter (Signed)
  Can you call and schedule this patient for B12 injections please.   Once a week for 4 weeks then once a month for 4 months.  Nina,cma

## 2019-04-07 ENCOUNTER — Telehealth: Payer: Self-pay

## 2019-04-07 ENCOUNTER — Other Ambulatory Visit: Payer: Self-pay | Admitting: *Deleted

## 2019-04-07 NOTE — Telephone Encounter (Signed)
Copied from [place] [phone]. Topic: General - Other >> Apr 07, 2019  3:35 PM Mcneil, Ja-Kwan wrote: Reason for CRM: Pt wife stated they have an order from Dr. Manuella Ghazi for pt to received 8 B-12 injections once a week for 4 weeks and once a month for 4 months. Pt wife stated she would like for pt to get 2 of the injections before they leave to go to the beach on 04/17/19. Pt wife requests call back. Cb# [phone]

## 2019-04-07 NOTE — Patient Outreach (Signed)
[facility]) Care Management  04/07/2019  William Taylor 11-13-1929 [id number]   Telephone assessment    Subjective   Outreach call to patient wife to follow up on her initial call to care coordinator regarding assistance with  getting B12 injections set up at PCP office.: initial call on 8/18, unsuccessful follow up call later in day  update on PCP having necessary information and agreement for patient to receive B12 injections at the office.  Patient with prior [facility] care management services .  Unsuccessful outreach call to patient wife mobile and home number able to leave a message.   Patient wife returned call ,I  explained Dr.Sonnenberg office now has schedule for patient B12 injections and plans call to set up.  Offered to assist patient wife with calling office to set up appointment, she declined stating that she would be able to do that. Patient wife verifies understanding of recent office visit instructions from Dr.Shah to continue B12 1000 mcg daily also. Wife discussed there plans for beach trip in the next 2 weeks, she will have family support on trip.   Discussed with wife, [facility] care management following  for additional needs or telephonic  support for chronic conditions . She again declines continued follow up.Verified that she has  [facility]N contact number if new concerns arise.Encouraged to continue to monitor patient blood pressures and daily weights and notify MD of concerns.   Plan Case will remain closed.    Joylene Draft, RN, [place] Management Coordinator  [phone]- Mobile [phone]- Toll Free Main Office

## 2019-04-09 ENCOUNTER — Telehealth: Payer: Self-pay | Admitting: Family Medicine

## 2019-04-09 ENCOUNTER — Ambulatory Visit (INDEPENDENT_AMBULATORY_CARE_PROVIDER_SITE_OTHER): Payer: Medicare HMO

## 2019-04-09 ENCOUNTER — Other Ambulatory Visit: Payer: Self-pay

## 2019-04-09 DIAGNOSIS — H401112 Primary open-angle glaucoma, right eye, moderate stage: Secondary | ICD-10-CM | POA: Diagnosis not present

## 2019-04-09 DIAGNOSIS — E538 Deficiency of other specified B group vitamins: Secondary | ICD-10-CM | POA: Diagnosis not present

## 2019-04-09 MED ORDER — CYANOCOBALAMIN 1000 MCG/ML IJ SOLN
1000.0000 ug | Freq: Once | INTRAMUSCULAR | Status: AC
  Administered 2019-04-09: 1000 ug via INTRAMUSCULAR

## 2019-04-09 NOTE — Telephone Encounter (Signed)
I called pt and left a vm to call ofc to sch B12.

## 2019-04-09 NOTE — Telephone Encounter (Signed)
I called pt and left a vm to call to sch Can you call and schedule this patient for B12 injections please.   Once a week for 4 weeks then once a month for 4 months.

## 2019-04-09 NOTE — Progress Notes (Signed)
Patient presented today for B12 injection.  Administered IM in left deltoid.  Patient tolerated well with no signs of distress.   

## 2019-04-12 ENCOUNTER — Other Ambulatory Visit: Payer: Self-pay

## 2019-04-12 MED ORDER — MIDODRINE HCL 10 MG PO TABS: 10.0000 mg | ORAL_TABLET | Freq: Three times a day (TID) | ORAL | 3 refills | Status: DC

## 2019-04-13 ENCOUNTER — Other Ambulatory Visit: Payer: Self-pay | Admitting: Cardiovascular Disease

## 2019-04-13 NOTE — Telephone Encounter (Signed)
Patient already had the first B12 injection.  Norene Oliveri,cma

## 2019-04-13 NOTE — Telephone Encounter (Signed)
Patient has came and received his first B12 injection.  Deaisha Welborn,cma

## 2019-04-14 NOTE — Telephone Encounter (Signed)
Please review for refill.  

## 2019-04-14 NOTE — Telephone Encounter (Signed)
Pt's age 85, wt 90.7 kg, SCr 0.99, CrCl 67.44, last ov w/ TG 01/07/19.

## 2019-04-15 ENCOUNTER — Other Ambulatory Visit: Payer: Self-pay

## 2019-04-15 ENCOUNTER — Ambulatory Visit (INDEPENDENT_AMBULATORY_CARE_PROVIDER_SITE_OTHER): Payer: Medicare HMO

## 2019-04-15 DIAGNOSIS — Z23 Encounter for immunization: Secondary | ICD-10-CM

## 2019-04-15 DIAGNOSIS — E538 Deficiency of other specified B group vitamins: Secondary | ICD-10-CM | POA: Diagnosis not present

## 2019-04-16 DIAGNOSIS — H401112 Primary open-angle glaucoma, right eye, moderate stage: Secondary | ICD-10-CM | POA: Diagnosis not present

## 2019-04-23 ENCOUNTER — Other Ambulatory Visit: Payer: Self-pay

## 2019-04-23 DIAGNOSIS — D5912 Cold autoimmune hemolytic anemia: Secondary | ICD-10-CM

## 2019-04-23 DIAGNOSIS — D509 Iron deficiency anemia, unspecified: Secondary | ICD-10-CM

## 2019-04-27 ENCOUNTER — Inpatient Hospital Stay (HOSPITAL_BASED_OUTPATIENT_CLINIC_OR_DEPARTMENT_OTHER): Payer: Medicare HMO | Admitting: Oncology

## 2019-04-27 ENCOUNTER — Other Ambulatory Visit: Payer: Self-pay

## 2019-04-27 ENCOUNTER — Other Ambulatory Visit: Payer: Self-pay | Admitting: *Deleted

## 2019-04-27 ENCOUNTER — Inpatient Hospital Stay: Payer: Medicare HMO | Attending: Oncology

## 2019-04-27 VITALS — BP 164/79 | HR 84 | Temp 97.8°F | Resp 18 | Wt 204.6 lb

## 2019-04-27 DIAGNOSIS — N183 Chronic kidney disease, stage 3 (moderate): Secondary | ICD-10-CM | POA: Insufficient documentation

## 2019-04-27 DIAGNOSIS — C61 Malignant neoplasm of prostate: Secondary | ICD-10-CM | POA: Insufficient documentation

## 2019-04-27 DIAGNOSIS — M9903 Segmental and somatic dysfunction of lumbar region: Secondary | ICD-10-CM | POA: Diagnosis not present

## 2019-04-27 DIAGNOSIS — M5125 Other intervertebral disc displacement, thoracolumbar region: Secondary | ICD-10-CM | POA: Diagnosis not present

## 2019-04-27 DIAGNOSIS — D591 Other autoimmune hemolytic anemias: Secondary | ICD-10-CM

## 2019-04-27 DIAGNOSIS — I129 Hypertensive chronic kidney disease with stage 1 through stage 4 chronic kidney disease, or unspecified chronic kidney disease: Secondary | ICD-10-CM | POA: Diagnosis not present

## 2019-04-27 DIAGNOSIS — G8929 Other chronic pain: Secondary | ICD-10-CM | POA: Diagnosis not present

## 2019-04-27 DIAGNOSIS — Z7901 Long term (current) use of anticoagulants: Secondary | ICD-10-CM | POA: Insufficient documentation

## 2019-04-27 DIAGNOSIS — D509 Iron deficiency anemia, unspecified: Secondary | ICD-10-CM

## 2019-04-27 DIAGNOSIS — D5912 Cold autoimmune hemolytic anemia: Secondary | ICD-10-CM

## 2019-04-27 DIAGNOSIS — M9902 Segmental and somatic dysfunction of thoracic region: Secondary | ICD-10-CM | POA: Diagnosis not present

## 2019-04-27 DIAGNOSIS — M5136 Other intervertebral disc degeneration, lumbar region: Secondary | ICD-10-CM | POA: Diagnosis not present

## 2019-04-27 LAB — CBC WITH DIFFERENTIAL/PLATELET
Abs Immature Granulocytes: 0.01 10*3/uL (ref 0.00–0.07)
Basophils Absolute: 0 10*3/uL (ref 0.0–0.1)
Basophils Relative: 1 %
Eosinophils Absolute: 0.1 10*3/uL (ref 0.0–0.5)
Eosinophils Relative: 1 %
HCT: 31.8 % — ABNORMAL LOW (ref 39.0–52.0)
Hemoglobin: 10.1 g/dL — ABNORMAL LOW (ref 13.0–17.0)
Immature Granulocytes: 0 %
Lymphocytes Relative: 25 %
Lymphs Abs: 1.6 10*3/uL (ref 0.7–4.0)
MCH: 30 pg (ref 26.0–34.0)
MCHC: 31.8 g/dL (ref 30.0–36.0)
MCV: 94.4 fL (ref 80.0–100.0)
Monocytes Absolute: 0.7 10*3/uL (ref 0.1–1.0)
Monocytes Relative: 11 %
Neutro Abs: 4.1 10*3/uL (ref 1.7–7.7)
Neutrophils Relative %: 62 %
Platelets: 259 10*3/uL (ref 150–400)
RBC: 3.37 MIL/uL — ABNORMAL LOW (ref 4.22–5.81)
RDW: 15.3 % (ref 11.5–15.5)
WBC: 6.4 10*3/uL (ref 4.0–10.5)
nRBC: 0 % (ref 0.0–0.2)

## 2019-04-27 LAB — COMPREHENSIVE METABOLIC PANEL
ALT: 12 U/L (ref 0–44)
AST: 23 U/L (ref 15–41)
Albumin: 3.9 g/dL (ref 3.5–5.0)
Alkaline Phosphatase: 35 U/L — ABNORMAL LOW (ref 38–126)
Anion gap: 9 (ref 5–15)
BUN: 31 mg/dL — ABNORMAL HIGH (ref 8–23)
CO2: 27 mmol/L (ref 22–32)
Calcium: 9.3 mg/dL (ref 8.9–10.3)
Chloride: 105 mmol/L (ref 98–111)
Creatinine, Ser: 1.29 mg/dL — ABNORMAL HIGH (ref 0.61–1.24)
GFR calc Af Amer: 57 mL/min — ABNORMAL LOW (ref >60)
GFR calc non Af Amer: 50 mL/min — ABNORMAL LOW (ref >60)
Glucose, Bld: 112 mg/dL — ABNORMAL HIGH (ref 70–99)
Potassium: 3.7 mmol/L (ref 3.5–5.1)
Sodium: 141 mmol/L (ref 135–145)
Total Bilirubin: 0.8 mg/dL (ref 0.3–1.2)
Total Protein: 6.9 g/dL (ref 6.5–8.1)

## 2019-04-27 LAB — RETICULOCYTES
Immature Retic Fract: 22.1 % — ABNORMAL HIGH (ref 2.3–15.9)
RBC.: 3.37 MIL/uL — ABNORMAL LOW (ref 4.22–5.81)
Retic Count, Absolute: 61.3 10*3/uL (ref 19.0–186.0)
Retic Ct Pct: 1.8 % (ref 0.4–3.1)

## 2019-04-27 NOTE — Progress Notes (Signed)
Pt in for follow up, denies any concerns today. 

## 2019-04-27 NOTE — Progress Notes (Signed)
Hematology/Oncology Consult note [facility]  [phone] [phone]  Patient Care Team: [name], MD as PCP - General (Family Medicine) [name], MD as PCP - Cardiology (Cardiology) Bary Castilla Forest Gleason, MD as Consulting Physician (General Surgery) [name], MD as Referring Physician (Internal Medicine) [name], MD as Consulting Physician (Cardiology)   Name of the patient: William Taylor  [id number]  Mar 18, 1930   Date of visit: 04/27/19  Diagnosis- cold agglutinin hemolytic anemia  Chief complaint/ Reason for visit-routine follow-up of cold agglutinin hemolytic anemia  Heme/Onc history: patient is a 85 year old male with a past medical history significant for prostate cancer for which he follows up with urology. He also has chronic back pain,hypertension and stage III CKD hyperlipidemia and A. fib among other medical problems. He has been referred to [place] for evaluation and management of anemia. Recent CBC from 02/16/2018 showed white count of 6.1, H&H of 10.7/30.8 with an MCV of 99.9. He was found to have coldagglutinin inhis peripheral blood. TSH was within normal limits.B12 and folate were within normal limits.His hemoglobin was 10.7 about 4 months ago but about 1 year ago his hemoglobin was 12.9.  Patient reports ongoing fatigue over the last 1 year. His appetite is good and his weight has been stable. He denies any lumps or bumps anywhere or drenching night sweats. Patient does report ongoing left-sided groin pain. He did have an ultrasound which did not reveal any adenopathy or mass. Also reports chronic back pain but denies any other acute joint pain or joint swelling. No skin rash.  Results of blood work from 03/05/2018 were as follows: CBC showed white count of 6.5, H&H of 11.9/35 with an MCV of 101 and a platelet count of 228. CMP was normal except for a mildly elevated creatinine of  1.3. LDH was normal at 122 reticulocyte count was low normal at 1.9. Multiple myeloma panel did not reveal any M protein serum immunofixation was normal. Free light chain showed mildly elevated kappa of 23.8 with a normal kappa lambda light chain ratio of 1.5. Haptoglobin was low at 21 cold agglutinin titer was positive at 1 is to 1024 Coombs test was positive for complement and negative for IgG smear review showed macrocytic anemia with rouleaux formation. Negative for schistocytes iron studies were within normal limits  CT chest abdomen and pelvis in November 2019 showed nonspecific 4 mm left lower lobe lung nodule. Prostatomegaly. No evidence of malignancy   Interval history-patient has been struggling with postural hypotension which eventually improved over the last 2 months.  He is able to walk again with the help of a cane without feeling lightheaded.  He has not had any recent falls.  He does report chronic fatigue.  Denies other complaints at this time  ECOG PS- 2 Pain scale- 0   Review of systems- Review of Systems  Constitutional: Positive for malaise/fatigue. Negative for chills, fever and weight loss.  HENT: Negative for congestion, ear discharge and nosebleeds.   Eyes: Negative for blurred vision.  Respiratory: Negative for cough, hemoptysis, sputum production, shortness of breath and wheezing.   Cardiovascular: Negative for chest pain, palpitations, orthopnea and claudication.  Gastrointestinal: Negative for abdominal pain, blood in stool, constipation, diarrhea, heartburn, melena, nausea and vomiting.  Genitourinary: Negative for dysuria, flank pain, frequency, hematuria and urgency.  Musculoskeletal: Negative for back pain, joint pain and myalgias.  Skin: Negative for rash.  Neurological: Negative for dizziness, tingling, focal weakness, seizures,  weakness and headaches.  Endo/Heme/Allergies: Does not bruise/bleed easily.  Psychiatric/Behavioral: Negative for  depression and suicidal ideas. The patient does not have insomnia.       Allergies  Allergen Reactions  . Bee Venom Swelling  . Sulfasalazine Hives and Swelling  . Sulfa Antibiotics Hives and Swelling  . Warfarin And Related Other (See Comments)    Chest pain     Past Medical History:  Diagnosis Date  . Cancer [facility])    Prostate, followed by Dr. Jacqlyn Larsen  . Colon polyp   . Coronary artery disease   . Gall stones   . Hyperlipidemia   . Hypertension   . MI (myocardial infarction) ([place])    2015  . PAF (paroxysmal atrial fibrillation) ([place])    a. on xarelto  . Skin cancer      Past Surgical History:  Procedure Laterality Date  . CARDIAC CATHETERIZATION  10/2013   [facility]  . CATARACT EXTRACTION  Oct. 3, 2012   right eye  . colonoscopy    . COLONOSCOPY W/ POLYPECTOMY  2015   Dr Rayann Heman  . COLONOSCOPY WITH PROPOFOL N/A 08/15/2016   Procedure: COLONOSCOPY WITH PROPOFOL;  Surgeon: [name], MD;  [facility] ENDOSCOPY;  Service: Endoscopy;  Laterality: N/A;  . CORONARY ANGIOPLASTY  2009   2005; s/p stent  . ENTEROSCOPY N/A 09/22/2018   Procedure: ENTEROSCOPY;  Surgeon: Lin Landsman, MD;  Location: [place];  Service: Gastroenterology;  Laterality: N/A;  . ESOPHAGOGASTRODUODENOSCOPY (EGD) WITH PROPOFOL N/A 08/15/2016   Procedure: ESOPHAGOGASTRODUODENOSCOPY (EGD) WITH PROPOFOL;  Surgeon: [name], MD;  Location: [facility] ENDOSCOPY;  Service: Endoscopy;  Laterality: N/A;  . GIVENS CAPSULE STUDY N/A 09/26/2016   Procedure: GIVENS CAPSULE STUDY;  Surgeon: [name], MD;  Location: [facility] ENDOSCOPY;  Service: Endoscopy;  Laterality: N/A;  . HERNIA REPAIR  2013  . LUMBAR SPINE SURGERY    . UPPER GI ENDOSCOPY  Sept 2015   Dr Rayann Heman    Social History   Socioeconomic History  . Marital status: Married    Spouse name: Not on file  . Number of children: Not on file  . Years of education: Not on file  . Highest education level: Not on file  Occupational History  . Occupation:  retired   Scientific laboratory technician  . Financial resource strain: Not hard at all  . Food insecurity    Worry: Never true    Inability: Never true  . Transportation needs    Medical: No    Non-medical: No  Tobacco Use  . Smoking status: Never Smoker  . Smokeless tobacco: Never Used  Substance and Sexual Activity  . Alcohol use: No  . Drug use: No  . Sexual activity: Not Currently  Lifestyle  . Physical activity    Days per week: Not on file    Minutes per session: Not on file  . Stress: Not on file  Relationships  . Social Herbalist on phone: Not on file    Gets together: Not on file    Attends religious service: Not on file    Active member of club or organization: Not on file    Attends meetings of clubs or organizations: Not on file    Relationship status: Not on file  . Intimate partner violence    Fear of current or ex partner: Not on file    Emotionally abused: Not on file    Physically abused: Not on file    Forced sexual activity:  Not on file  Other Topics Concern  . Not on file  Social History Narrative  . Not on file    Family History  Problem Relation Age of Onset  . Stroke Mother   . Colon cancer Father   . Coronary artery disease Brother   . Other Brother 65       lymes dz     Current Outpatient Medications:  .  amiodarone (PACERONE) 100 MG tablet, Take 1 tablet (100 mg total) by mouth daily for 30 days., Disp: 30 tablet, Rfl: 0 .  Droxidopa (NORTHERA) 100 MG CAPS, Take 200 mg by mouth 3 (three) times daily., Disp: , Rfl:  .  fenofibrate micronized (ANTARA) 130 MG capsule, TAKE 1 CAPSULE BY MOUTH  DAILY BEFORE BREAKFAST, Disp: 90 capsule, Rfl: 0 .  levobunolol (BETAGAN) 0.5 % ophthalmic solution, Place 1 drop into the right eye 2 (two) times daily. , Disp: , Rfl:  .  levothyroxine (SYNTHROID, LEVOTHROID) 50 MCG tablet, Take 1 tablet (50 mcg total) by mouth daily at 6 (six) AM., Disp: 90 tablet, Rfl: 0 .  Melatonin 3 MG CAPS, Take 2 capsules by mouth  every evening., Disp: , Rfl:  .  midodrine (PROAMATINE) 10 MG tablet, Take 1 tablet (10 mg total) by mouth 3 (three) times daily., Disp: 270 tablet, Rfl: 3 .  mirtazapine (REMERON) 15 MG tablet, Take 1 tablet (15 mg total) by mouth at bedtime., Disp: 30 tablet, Rfl: 3 .  Multiple Vitamin (MULTIVITAMIN PO), Take 1 tablet by mouth daily., Disp: , Rfl:  .  nitroGLYCERIN (NITROSTAT) 0.4 MG SL tablet, DISSOLVE UNDER THE TONGUE 1 TABLET EVERY 5 MINUTES AS NEEDED FOR CHEST PAIN, Disp: 25 tablet, Rfl: 3 .  pantoprazole (PROTONIX) 40 MG tablet, TAKE ONE TABLET BY MOUTH TWICE DAILY, Disp: 60 tablet, Rfl: 2 .  pravastatin (PRAVACHOL) 40 MG tablet, TAKE 1 TABLET BY MOUTH AT  BEDTIME, Disp: 90 tablet, Rfl: 3 .  QC NATURA-LAX powder, MIX AND TAKE 17 GM (LINE ON INSIDE OF LID) TWICE DAILY AS NEEDED, Disp: 238 g, Rfl: 0 .  rivaroxaban (XARELTO) 20 MG TABS tablet, Take 1 tablet (20 mg total) by mouth daily., Disp: 90 tablet, Rfl: 1 .  thiamine (VITAMIN B-1) 100 MG tablet, Take 100 mg by mouth daily., Disp: , Rfl:  .  cyproheptadine (PERIACTIN) 4 MG tablet, Take 4 mg by mouth 3 (three) times daily as needed., Disp: , Rfl:  .  finasteride (PROSCAR) 5 MG tablet, Take 1 tablet (5 mg total) by mouth daily. (Patient not taking: Reported on 04/27/2019), Disp: 90 tablet, Rfl: 2  Physical exam:  Vitals:   04/27/19 1206  BP: (!) 164/79  Pulse: 84  Resp: 18  Temp: 97.8 F (36.6 C)  TempSrc: Tympanic  Weight: 204 lb 9.6 oz (92.8 kg)   Physical Exam Constitutional:      Comments: Tall elderly gentleman in no acute distress  HENT:     Head: Normocephalic and atraumatic.  Eyes:     Pupils: Pupils are equal, round, and reactive to light.  Neck:     Musculoskeletal: Normal range of motion.  Cardiovascular:     Rate and Rhythm: Normal rate and regular rhythm.     Heart sounds: Normal heart sounds.  Pulmonary:     Effort: Pulmonary effort is normal.     Breath sounds: Normal breath sounds.  Abdominal:     General:  Bowel sounds are normal.     Palpations: Abdomen is soft.  Skin:    General: Skin is warm and dry.  Neurological:     Mental Status: He is alert and oriented to person, place, and time.      CMP Latest Ref Rng & Units 04/27/2019  Glucose 70 - 99 mg/dL 112(H)  BUN 8 - 23 mg/dL 31(H)  Creatinine 0.61 - 1.24 mg/dL 1.29(H)  Sodium 135 - 145 mmol/L 141  Potassium 3.5 - 5.1 mmol/L 3.7  Chloride 98 - 111 mmol/L 105  CO2 22 - 32 mmol/L 27  Calcium 8.9 - 10.3 mg/dL 9.3  Total Protein 6.5 - 8.1 g/dL 6.9  Total Bilirubin 0.3 - 1.2 mg/dL 0.8  Alkaline Phos 38 - 126 U/L 35(L)  AST 15 - 41 U/L 23  ALT 0 - 44 U/L 12   CBC Latest Ref Rng & Units 04/27/2019  WBC 4.0 - 10.5 K/uL 6.4  Hemoglobin 13.0 - 17.0 g/dL 10.1(L)  Hematocrit 39.0 - 52.0 % 31.8(L)  Platelets 150 - 400 K/uL 259      Assessment and plan- Patient is a 85 y.o. male with cold agglutinin hemolytic anemia here for routine follow-up  Patient's hemoglobin has remained stable around 10 for the last 9 to 10 months.  He does have baseline cold agglutinin hemolytic anemia of unclear etiology in addition to anemia of chronic disease.  His bilirubin is currently normal and his haptoglobin is normal as well.  He does not require any treatment for this presently.  I will see him back in 4 months time with CBC with differential, CMP, reticulocyte count and haptoglobin   Visit Diagnosis 1. Cold agglutinin disease ([place])      Dr. Randa Evens, MD, MPH [facility] [phone] 04/27/2019 2:16 PM

## 2019-04-28 LAB — HAPTOGLOBIN: Haptoglobin: 28 mg/dL — ABNORMAL LOW (ref 38–329)

## 2019-04-29 ENCOUNTER — Encounter: Payer: Self-pay | Admitting: Oncology

## 2019-04-29 ENCOUNTER — Other Ambulatory Visit: Payer: Self-pay

## 2019-04-29 ENCOUNTER — Ambulatory Visit (INDEPENDENT_AMBULATORY_CARE_PROVIDER_SITE_OTHER): Payer: Medicare HMO

## 2019-04-29 ENCOUNTER — Telehealth: Payer: Self-pay

## 2019-04-29 DIAGNOSIS — E538 Deficiency of other specified B group vitamins: Secondary | ICD-10-CM

## 2019-04-29 MED ORDER — CYANOCOBALAMIN 1000 MCG/ML IJ SOLN
1000.0000 ug | Freq: Once | INTRAMUSCULAR | Status: AC
  Administered 2019-04-29: 1000 ug via INTRAMUSCULAR

## 2019-04-29 MED ORDER — CYANOCOBALAMIN 1000 MCG/ML IJ SOLN
1000.0000 ug | Freq: Once | INTRAMUSCULAR | Status: AC
  Administered 2019-04-15: 1000 ug via INTRAMUSCULAR

## 2019-04-29 NOTE — Progress Notes (Signed)
Patient presented for B12 injection.  Administered IM in right deltoid. Patient tolerated well with no signs of distress.

## 2019-04-29 NOTE — Progress Notes (Signed)
Patient presented for B 12 injection to left deltoid, patient voiced no concerns nor showed any signs of distress during injection. 

## 2019-04-29 NOTE — Telephone Encounter (Signed)
Patient said that he has been taking a 40 mg tablet of pantoprazole (Protonix) daily since Dec 30, 2018.  Patient wants to know if he should continue with medication or stop.  Patient left mediation information flyer.  Flyer put in your "to be signed" box for your review.  Please advise.

## 2019-04-29 NOTE — Telephone Encounter (Signed)
It looks like this medication is being prescribed by Dr [name]. I would suggest the patient reach out to them to see if he needs to continue on the protonix.

## 2019-04-30 NOTE — Telephone Encounter (Signed)
Called patient.  No answer.  Left detailed message on home phone (ok per DPR).

## 2019-05-04 ENCOUNTER — Other Ambulatory Visit: Payer: Self-pay | Admitting: Family Medicine

## 2019-05-04 NOTE — Progress Notes (Signed)
B12 injection given by CMA  Tolerated well  Philis Nettle FNP

## 2019-05-05 ENCOUNTER — Other Ambulatory Visit: Payer: Self-pay

## 2019-05-06 ENCOUNTER — Ambulatory Visit (INDEPENDENT_AMBULATORY_CARE_PROVIDER_SITE_OTHER): Payer: Medicare HMO

## 2019-05-06 ENCOUNTER — Other Ambulatory Visit: Payer: Self-pay

## 2019-05-06 DIAGNOSIS — E538 Deficiency of other specified B group vitamins: Secondary | ICD-10-CM | POA: Diagnosis not present

## 2019-05-06 MED ORDER — CYANOCOBALAMIN 1000 MCG/ML IJ SOLN
1000.0000 ug | Freq: Once | INTRAMUSCULAR | Status: AC
  Administered 2019-05-06: 1000 ug via INTRAMUSCULAR

## 2019-05-06 NOTE — Progress Notes (Signed)
Patient presented today for B12 injection.  Administered IM in right deltoid.  Patient tolerated well with no signs of distress.   

## 2019-05-25 DIAGNOSIS — M9902 Segmental and somatic dysfunction of thoracic region: Secondary | ICD-10-CM | POA: Diagnosis not present

## 2019-05-25 DIAGNOSIS — M5136 Other intervertebral disc degeneration, lumbar region: Secondary | ICD-10-CM | POA: Diagnosis not present

## 2019-05-25 DIAGNOSIS — M5125 Other intervertebral disc displacement, thoracolumbar region: Secondary | ICD-10-CM | POA: Diagnosis not present

## 2019-05-25 DIAGNOSIS — M9903 Segmental and somatic dysfunction of lumbar region: Secondary | ICD-10-CM | POA: Diagnosis not present

## 2019-05-31 ENCOUNTER — Telehealth: Payer: Self-pay | Admitting: Cardiovascular Disease

## 2019-05-31 NOTE — Telephone Encounter (Signed)
No ans no vm   °

## 2019-06-01 NOTE — Telephone Encounter (Signed)
Patient overdue for fu

## 2019-06-08 ENCOUNTER — Other Ambulatory Visit: Payer: Self-pay

## 2019-06-08 ENCOUNTER — Ambulatory Visit (INDEPENDENT_AMBULATORY_CARE_PROVIDER_SITE_OTHER): Payer: Medicare HMO | Admitting: *Deleted

## 2019-06-08 DIAGNOSIS — E538 Deficiency of other specified B group vitamins: Secondary | ICD-10-CM | POA: Diagnosis not present

## 2019-06-08 MED ORDER — CYANOCOBALAMIN 1000 MCG/ML IJ SOLN
1000.0000 ug | Freq: Once | INTRAMUSCULAR | Status: AC
  Administered 2019-06-08: 1000 ug via INTRAMUSCULAR

## 2019-06-08 MED ORDER — CYANOCOBALAMIN 1000 MCG/ML IJ SOLN: 1000.0000 ug | Freq: Once | INTRAMUSCULAR | Status: DC

## 2019-06-08 NOTE — Progress Notes (Signed)
Patient presented for B 12 injection to left deltoid, patient voiced no concerns nor showed any signs of distress during injection. 

## 2019-06-16 ENCOUNTER — Other Ambulatory Visit: Payer: Self-pay | Admitting: Family Medicine

## 2019-06-17 ENCOUNTER — Emergency Department
Admission: EM | Admit: 2019-06-17 | Discharge: 2019-06-17 | Disposition: A | Discharge status: Home / Self Care | Payer: Medicare HMO | Attending: Emergency Medicine | Admitting: Emergency Medicine

## 2019-06-17 ENCOUNTER — Other Ambulatory Visit: Payer: Self-pay

## 2019-06-17 ENCOUNTER — Emergency Department: Payer: Medicare HMO

## 2019-06-17 ENCOUNTER — Encounter: Payer: Self-pay | Admitting: Emergency Medicine

## 2019-06-17 DIAGNOSIS — R079 Chest pain, unspecified: Secondary | ICD-10-CM

## 2019-06-17 DIAGNOSIS — R0789 Other chest pain: Secondary | ICD-10-CM | POA: Insufficient documentation

## 2019-06-17 DIAGNOSIS — I129 Hypertensive chronic kidney disease with stage 1 through stage 4 chronic kidney disease, or unspecified chronic kidney disease: Secondary | ICD-10-CM | POA: Insufficient documentation

## 2019-06-17 DIAGNOSIS — K449 Diaphragmatic hernia without obstruction or gangrene: Secondary | ICD-10-CM | POA: Diagnosis not present

## 2019-06-17 DIAGNOSIS — Z8546 Personal history of malignant neoplasm of prostate: Secondary | ICD-10-CM | POA: Insufficient documentation

## 2019-06-17 DIAGNOSIS — Z79899 Other long term (current) drug therapy: Secondary | ICD-10-CM | POA: Diagnosis not present

## 2019-06-17 DIAGNOSIS — K5730 Diverticulosis of large intestine without perforation or abscess without bleeding: Secondary | ICD-10-CM | POA: Diagnosis not present

## 2019-06-17 DIAGNOSIS — N183 Chronic kidney disease, stage 3 unspecified: Secondary | ICD-10-CM | POA: Insufficient documentation

## 2019-06-17 DIAGNOSIS — Z7901 Long term (current) use of anticoagulants: Secondary | ICD-10-CM | POA: Diagnosis not present

## 2019-06-17 DIAGNOSIS — I10 Essential (primary) hypertension: Secondary | ICD-10-CM

## 2019-06-17 DIAGNOSIS — I480 Paroxysmal atrial fibrillation: Secondary | ICD-10-CM | POA: Insufficient documentation

## 2019-06-17 DIAGNOSIS — K8020 Calculus of gallbladder without cholecystitis without obstruction: Secondary | ICD-10-CM | POA: Diagnosis not present

## 2019-06-17 DIAGNOSIS — I2699 Other pulmonary embolism without acute cor pulmonale: Secondary | ICD-10-CM | POA: Diagnosis not present

## 2019-06-17 DIAGNOSIS — I259 Chronic ischemic heart disease, unspecified: Secondary | ICD-10-CM | POA: Insufficient documentation

## 2019-06-17 LAB — BASIC METABOLIC PANEL
Anion gap: 11 (ref 5–15)
BUN: 24 mg/dL — ABNORMAL HIGH (ref 8–23)
CO2: 25 mmol/L (ref 22–32)
Calcium: 9.5 mg/dL (ref 8.9–10.3)
Chloride: 104 mmol/L (ref 98–111)
Creatinine, Ser: 1.36 mg/dL — ABNORMAL HIGH (ref 0.61–1.24)
GFR calc Af Amer: 54 mL/min — ABNORMAL LOW (ref >60)
GFR calc non Af Amer: 46 mL/min — ABNORMAL LOW (ref >60)
Glucose, Bld: 103 mg/dL — ABNORMAL HIGH (ref 70–99)
Potassium: 3.5 mmol/L (ref 3.5–5.1)
Sodium: 140 mmol/L (ref 135–145)

## 2019-06-17 LAB — TROPONIN I (HIGH SENSITIVITY)
Troponin I (High Sensitivity): 14 ng/L (ref <18)
Troponin I (High Sensitivity): 16 ng/L (ref <18)

## 2019-06-17 LAB — CBC
HCT: 30.7 % — ABNORMAL LOW (ref 39.0–52.0)
Hemoglobin: 10.2 g/dL — ABNORMAL LOW (ref 13.0–17.0)
MCH: 31.9 pg (ref 26.0–34.0)
MCHC: 33.2 g/dL (ref 30.0–36.0)
MCV: 95.9 fL (ref 80.0–100.0)
Platelets: 289 10*3/uL (ref 150–400)
RBC: 3.2 MIL/uL — ABNORMAL LOW (ref 4.22–5.81)
RDW: 16 % — ABNORMAL HIGH (ref 11.5–15.5)
WBC: 7.7 10*3/uL (ref 4.0–10.5)
nRBC: 0 % (ref 0.0–0.2)

## 2019-06-17 MED ORDER — CLONIDINE HCL 0.1 MG PO TABS
0.1000 mg | ORAL_TABLET | Freq: Once | ORAL | Status: AC
  Administered 2019-06-17: 0.1 mg via ORAL
  Filled 2019-06-17: qty 1

## 2019-06-17 MED ORDER — IOHEXOL 350 MG/ML SOLN
75.0000 mL | Freq: Once | INTRAVENOUS | Status: AC | PRN
  Administered 2019-06-17: 75 mL via INTRAVENOUS

## 2019-06-17 MED ORDER — LORAZEPAM 2 MG/ML IJ SOLN
0.5000 mg | Freq: Once | INTRAMUSCULAR | Status: AC
  Administered 2019-06-17: 0.5 mg via INTRAVENOUS
  Filled 2019-06-17: qty 1

## 2019-06-17 NOTE — ED Notes (Signed)
Pt denies CP/SHOB a this time. Pt A/Ox4. Pt speaking to wife over the phone.

## 2019-06-17 NOTE — ED Notes (Signed)
Patient transported to CT 

## 2019-06-17 NOTE — ED Notes (Signed)
Pt st intermittent left sided chest pain with "some SHOB". EDP Paduchowski at bedside.

## 2019-06-17 NOTE — ED Provider Notes (Signed)
[facility] Emergency Department Provider Note  Time seen: 8:29 PM  I have reviewed the triage vital signs and the nursing notes.   HISTORY  Chief Complaint Chest Pain   HPI William Taylor is a 85 y.o. male with a past medical history of prostate cancer, hypertension, hyperlipidemia and prior MI, presents to the emergency department for acute onset of chest pain.  According to the patient around 5 PM tonight he had acute onset of sharp chest pain to the center and somewhat left of his lower chest.  States initially it was quite severe causing him to bend over and pain, states that is since largely resolved he states just a slight discomfort in this area.  Denies any nausea or vomiting.  Denies any new shortness of breath cough or fever.  Denies any pain in the abdomen.  Patient states his wife made him come to the emergency department to be evaluated.   Past Medical History:  Diagnosis Date  . Cancer [facility])    Prostate, followed by Dr. Jacqlyn Larsen  . Colon polyp   . Coronary artery disease   . Gall stones   . Hyperlipidemia   . Hypertension   . MI (myocardial infarction) ([place])    2015  . PAF (paroxysmal atrial fibrillation) ([place])    a. on xarelto  . Skin cancer     Patient Active Problem List   Diagnosis Date Noted  . Depression 01/11/2019  . Dizziness 12/15/2018  . Gastrointestinal hemorrhage with melena   . Malnutrition of moderate degree 09/21/2018  . Orthostatic hypotension 06/06/2018  . Chronic pain of left knee 06/05/2018  . Chronic pain in testicle 06/05/2018  . Chronic back pain 02/17/2018  . Chest pain 03/07/2017  . Angiodysplasia of intestinal tract   . Iron deficiency anemia   . Benign neoplasm of ascending colon   . Diverticulosis of large intestine without diverticulitis   . Gastric polyp   . Left rotator cuff tear arthropathy 09/11/2015  . Fatigue 04/15/2014  . Abdominal pain 01/18/2014  . Prostate cancer ([place]) 03/15/2013  . DOE  (dyspnea on exertion) 11/23/2012  . Insomnia 09/03/2012  . CKD (chronic kidney disease), stage III ([place]) 07/23/2011  . Pulmonary embolism ([place]) 01/09/2011  . Paroxysmal A-fib ([place]) 09/06/2010  . Mixed hyperlipidemia 05/19/2009  . Coronary artery disease of native artery of native heart with stable angina pectoris ([place]) 05/19/2009    Past Surgical History:  Procedure Laterality Date  . CARDIAC CATHETERIZATION  10/2013   [facility]  . CATARACT EXTRACTION  Oct. 3, 2012   right eye  . colonoscopy    . COLONOSCOPY W/ POLYPECTOMY  2015   Dr Rayann Heman  . COLONOSCOPY WITH PROPOFOL N/A 08/15/2016   Procedure: COLONOSCOPY WITH PROPOFOL;  Surgeon: [name], MD;  Location: [facility] ENDOSCOPY;  Service: Endoscopy;  Laterality: N/A;  . CORONARY ANGIOPLASTY  2009   2005; s/p stent  . ENTEROSCOPY N/A 09/22/2018   Procedure: ENTEROSCOPY;  Surgeon: Lin Landsman, MD;  Location: [place];  Service: Gastroenterology;  Laterality: N/A;  . ESOPHAGOGASTRODUODENOSCOPY (EGD) WITH PROPOFOL N/A 08/15/2016   Procedure: ESOPHAGOGASTRODUODENOSCOPY (EGD) WITH PROPOFOL;  Surgeon: [name], MD;  Location: [facility] ENDOSCOPY;  Service: Endoscopy;  Laterality: N/A;  . GIVENS CAPSULE STUDY N/A 09/26/2016   Procedure: GIVENS CAPSULE STUDY;  Surgeon: [name], MD;  Location: [facility] ENDOSCOPY;  Service: Endoscopy;  Laterality: N/A;  . HERNIA REPAIR  2013  . LUMBAR SPINE SURGERY    . UPPER GI ENDOSCOPY  Sept 2015   Dr Rayann Heman    Prior to Admission medications   Medication Sig Start Date End Date Taking? Authorizing Provider  amiodarone (PACERONE) 100 MG tablet Take 1 tablet (100 mg total) by mouth daily for 30 days. 12/20/18 04/27/19  Saundra Shelling, MD  cyproheptadine (PERIACTIN) 4 MG tablet Take 4 mg by mouth 3 (three) times daily as needed.    [provider]  Droxidopa (NORTHERA) 100 MG CAPS Take 200 mg by mouth 3 (three) times daily.    [provider]  fenofibrate micronized (ANTARA) 130 MG capsule TAKE 1  CAPSULE BY MOUTH  DAILY BEFORE BREAKFAST 05/05/19   [name], MD  finasteride (PROSCAR) 5 MG tablet Take 1 tablet (5 mg total) by mouth daily. Patient not taking: Reported on 04/27/2019 10/16/18   Abbie Sons, MD  fludrocortisone (FLORINEF) 0.1 MG tablet  04/12/19   [provider]  levobunolol (BETAGAN) 0.5 % ophthalmic solution Place 1 drop into the right eye 2 (two) times daily.     [provider]  levothyroxine (SYNTHROID, LEVOTHROID) 50 MCG tablet Take 1 tablet (50 mcg total) by mouth daily at 6 (six) AM. 09/24/18   Salary, Avel Peace, MD  Melatonin 3 MG CAPS Take 2 capsules by mouth every evening.    [provider]  midodrine (PROAMATINE) 10 MG tablet Take 1 tablet (10 mg total) by mouth 3 (three) times daily. 04/12/19 04/11/20  [name], MD  mirtazapine (REMERON) 15 MG tablet TAKE 1 TABLET BY MOUTH NIGHTLY 06/17/19   [name], MD  Multiple Vitamin (MULTIVITAMIN PO) Take 1 tablet by mouth daily.    [provider]  nitroGLYCERIN (NITROSTAT) 0.4 MG SL tablet DISSOLVE UNDER THE TONGUE 1 TABLET EVERY 5 MINUTES AS NEEDED FOR CHEST PAIN 06/11/16   [name], MD  pantoprazole (PROTONIX) 40 MG tablet TAKE ONE TABLET BY MOUTH TWICE DAILY 02/22/19   [name], MD  pravastatin (PRAVACHOL) 40 MG tablet TAKE 1 TABLET BY MOUTH AT  BEDTIME 06/08/18   [name], MD  QC NATURA-LAX powder MIX AND TAKE 17 GM (LINE ON INSIDE OF LID) TWICE DAILY AS NEEDED 12/04/18   [name], MD  rivaroxaban (XARELTO) 20 MG TABS tablet Take 1 tablet (20 mg total) by mouth daily. 04/14/19   [name], MD  thiamine (VITAMIN B-1) 100 MG tablet Take 100 mg by mouth daily. 01/05/19 01/05/20  [provider]    Allergies  Allergen Reactions  . Bee Venom Swelling  . Sulfasalazine Hives and Swelling  . Sulfa Antibiotics Hives and Swelling  . Warfarin And Related Other (See Comments)    Chest pain    Family History  Problem  Relation Age of Onset  . Stroke Mother   . Colon cancer Father   . Coronary artery disease Brother   . Other Brother 61       lymes dz    Social History Social History   Tobacco Use  . Smoking status: Never Smoker  . Smokeless tobacco: Never Used  Substance Use Topics  . Alcohol use: No  . Drug use: No    Review of Systems Constitutional: Negative for fever. Cardiovascular: Positive for sharp sudden chest pain to the left lower chest. Respiratory: Negative for shortness of breath.  Negative for cough. Gastrointestinal: Negative for abdominal pain Musculoskeletal: Negative for musculoskeletal complaints Neurological: Negative for headache All other ROS negative  ____________________________________________   PHYSICAL EXAM:  VITAL SIGNS:  ED Triage Vitals  Enc Vitals Group     BP 06/17/19 1733 (!) 184/100     Pulse Rate 06/17/19 1733 89     Resp 06/17/19 1733 14     Temp 06/17/19 1733 98.2 F (36.8 C)     Temp Source 06/17/19 1733 Oral     SpO2 06/17/19 1733 95 %     Weight --      Height --      Head Circumference --      Peak Flow --      Pain Score 06/17/19 1732 0     Pain Loc --      Pain Edu? --      Excl. in [place]? --    Constitutional: Alert and oriented. Well appearing and in no distress. Eyes: Normal exam ENT      Head: Normocephalic and atraumatic.      Mouth/Throat: Mucous membranes are moist. Cardiovascular: Normal rate, regular rhythm.  Respiratory: Normal respiratory effort without tachypnea nor retractions. Breath sounds are clear  Gastrointestinal: Soft and nontender. No distention. Musculoskeletal: Nontender with normal range of motion in all extremities.  Neurologic:  Normal speech and language. No gross focal neurologic deficits  Skin:  Skin is warm, dry and intact.  Psychiatric: Mood and affect are normal.   ____________________________________________    EKG  EKG viewed and interpreted by myself shows a normal sinus rhythm 88 bpm  with a narrow QRS, mild left axis deviation, largely normal intervals with no concerning ST changes.  ____________________________________________    RADIOLOGY  Chest x-ray negative  ____________________________________________   INITIAL IMPRESSION / ASSESSMENT AND PLAN / ED COURSE  Pertinent labs & imaging results that were available during my care of the patient were reviewed by me and considered in my medical decision making (see chart for details).   Patient presents emergency department for acute onset of sharp chest pain in the center to left lower chest.  States initially it was severe sharp causing him to bend over and pain has since largely resolved now states a slight discomfort to the area.  Patient is noted to be quite hypertensive 184/100 in triage [id number] systolic during my evaluation in the room.  Patient is mildly anxious appearing.  Patient's labs are thus far largely reassuring slight renal insufficiency however this is largely unchanged from baseline.  Troponin is negative.  We will repeat a troponin.  Given the sharp stabbing chest pain with significant hypertension we will also proceed with a CT angiography of the chest to rule out dissection.  Patient agreeable to plan of care.  Patient's work-up is been largely nonrevealing.  Troponin negative x2.  CTA negative for any acute abnormality.  Patient continues to be quite hypertensive currently [id number] systolic.  I reviewed the patient's records including cardiology notes patient is known to be significantly orthostatic at baseline is on 3 medications including midodrine to increase his blood pressure.  Recently had his midodrine increased from 5 mg to 10 mg 3 times daily.  Given the patient's significant hypertension I discussed the patient with cardiologist Dr. Haroldine Laws.  He recommends obtaining orthostatic vitals, patient's blood pressure was nearly identical lying versus sitting up, no significant orthostasis.  We will dose 0.1 mg  of clonidine in an attempt to reduce the patient's blood pressure to approximately [id number] systolic per cardiology recommendations.  He also recommended instructing the patient not to take his midodrine tonight, and starting tomorrow decreasing his dose to 5 mg instead of  10 mg.  States he will discuss the patient with Dr. Rockey Situ his primary cardiologist and the patient is to call Dr. Dorothy Spark tomorrow morning for an appointment.  Patient agreeable to plan of care.  William Taylor was evaluated in Emergency Department on 06/17/2019 for the symptoms described in the history of present illness. He was evaluated in the context of the global COVID-19 pandemic, which necessitated consideration that the patient might be at risk for infection with the SARS-CoV-2 virus that causes COVID-19. Institutional protocols and algorithms that pertain to the evaluation of patients at risk for COVID-19 are in a state of rapid change based on information released by regulatory bodies including the CDC and federal and state organizations. These policies and algorithms were followed during the patient's care in the ED.  ____________________________________________   FINAL CLINICAL IMPRESSION(S) / ED DIAGNOSES  Chest pain Hypertension   Harvest Dark, MD 06/17/19 2240

## 2019-06-17 NOTE — ED Notes (Signed)
ED Provider Paduchowski at bedside. 

## 2019-06-17 NOTE — Discharge Instructions (Addendum)
You have been seen in the emergency department today for chest pain.  Your work-up has shown normal results regarding your chest pain including a normal CT scan as well as lab work.  However you were found to be quite hypertensive tonight in the emergency department.  As we discussed please do not take your midodrine dose tonight.  Starting tomorrow please reduce your midodrine dose from 10 mg to 5 mg up to 3 times daily, but only as needed for hypotension/dizziness.  Please call the number provided for Dr. Rockey Situ to arrange an appointment preferably tomorrow to be reevaluated.  Return to the emergency department for any return of chest pain, development of shortness of breath, or any other symptom personally concerning to yourself.

## 2019-06-17 NOTE — ED Notes (Signed)
Pt back from CT

## 2019-06-17 NOTE — ED Triage Notes (Signed)
Pt c/o CP x2hrs. Describing it as sharp in upper epigastric area. Denies SOB, cough or fever.  LBM today.

## 2019-06-18 ENCOUNTER — Other Ambulatory Visit: Payer: Self-pay | Admitting: Cardiovascular Disease

## 2019-06-18 MED ORDER — NITROGLYCERIN 0.4 MG SL SUBL: SUBLINGUAL_TABLET | SUBLINGUAL | 0 refills | Status: DC

## 2019-06-18 NOTE — Telephone Encounter (Signed)
°*  STAT* If patient is at the pharmacy, call can be transferred to refill team.   1. Which medications need to be refilled? (please list name of each medication and dose if known) nitroGLYCERIN 0.4 MG as needed    2. Which pharmacy/location (including street and city if local pharmacy) is medication to be sent to? [facility]   3. Do they need a 30 day or 90 day supply? 30 day

## 2019-06-20 NOTE — Progress Notes (Signed)
Cardiology Office Note    Date:  06/22/2019   ID:  William Taylor, DOB 30-Apr-1930, MRN [id number]  PCP:  [name], MD  Cardiologist:  Ida Rogue, MD  Electrophysiologist:  None   Chief Complaint: ED follow-up  History of Present Illness:   William Taylor is a 85 y.o. male with history of CAD status post LAD stenting in 2005 status post PCI to the [place] in 2012, PAF on Xarelto, PE in 2006 while living in [place], recurrent PE following catheterization in 2012, atypical chest pain, cold agglutinin hemolytic anemia, orthostatic hypotension, prostate cancer, hypertension, and hyperlipidemia presents for ED follow-up.  Patient previously underwent stenting to the LAD in 2005. LHC in 2009 showed a patent LAD stent without other significant disease. He was diagnosed with Afib in 04/2010 in the setting of a UTI. He has had recurrent Afib in at least 2012, 2013, and 2018. Over the years he has been anticoagulated with Xarelto and been on metoprolol as well as amiodarone with medications previously being held secondary to bradycardia. Repeat cardiac cath in 2012 showed 90% OM2 stenosis and underwent PCI/[facility] at [facility]. Following the cath, he had flank pain and was diagnosed with a PE with heavy clot burden. Most recent cardiac cath from 10/2013 showed mid LAD-1 lesion 10% ISR, mid LAD-2 lesion 40%, D1-1 lesion 90%, D1-2 lesion 70%, OM1 80%, OM2 stent patent without residual restenosis.Medical management was advised.Most recent ischemic evaluation from 09/2017 via nuclear stress testing showed no significant ischemia, EF 69%, low risk scan. Wcho from 02/2017 showed an EF of 55-60%, mild to moderate LVH, no RWMA, Gr1DD, mildly dilated aortic root at 40 mm, no AI, moderately dilated left atrium.  Most recent echo from 08/2018 showed an EF of 55 to 60%, mild concentric LVH, grade 1 diastolic dysfunction, trivial aortic valve insufficiency, normal RV systolic function.  Patient has been  evaluated in the ED intermittently for recurrence of chest pain with cardiac work-up at those times being unrevealing.  Patient has known severe orthostasis and has been managed on midodrine, Florinef (though this was subsequently discontinued at outside hospital), and Northera.  We were notified in 01/2019 the patient was having swelling in his legs as well as labile BP readings ranging from the [id number] to [id number] systolic over [id number] to low [id number] diastolic.  He was admitted to [facility] in 01/2019 with nausea vomiting, failure to thrive, and orthostatic hypotension.  His droxidopa was decreased along with discontinuation of Flomax and ranolazine with addition of Remeron.  With this, the patient noted significant improvement in symptoms and overall appetite.  More recently, he was seen in the ED on 06/17/2019 with abdominal/lower chest pain described as sharp.  High-sensitivity troponin of 14 with a delta of 16.  Hemoglobin low though stable at 10.2.  Renal function showed a BUN of 24 with a serum creatinine 1.36 with a baseline approximately 0.6-0.9.  Chest x-ray showed no active cardiopulmonary disease.  CTA chest, abdomen, pelvis showed no evidence of aortic dissection or aneurysmal dilatation in the chest or abdomen.  Incidentally, a small sliding-type hiatal hernia was noted as well as bilateral hypodense lesions within the thyroid that were stable when compared to prior exam, stable 4 mm left lower lobe pulmonary nodule, diverticulosis without diverticulitis, and cholelithiasis were noted.  EKG was nonacute.  BP was poorly controlled at 184/100 with repeat BP noted to be [id number] systolic.  Patient was given 0.1 mg of clonidine and advised to hold midodrine  that evening as well as to decrease midodrine to 5 mg 3 times daily.  Since his ED visit he has not had any further abdominal/chest pain.  He does report food has not tasted "right" for the past several weeks and in this setting he has been drinking increased amounts of  water which is atypical for him.  That said, he does continue to have a good appetite.  Despite this reported increase water intake his renal function was higher than his baseline when obtained in the ED as outlined above.  With this, he has noted an improvement in orthostatic dizziness.  He has been taking lower dose droxidopa 20 mg 3 times a day, fludrocortisone 0.1 mg once daily, and most recently midodrine 5 mg 3 times daily.  He does note some faster heart rates into the 80s to 90s and maybe low 100s bpm with sudden positional changes, otherwise he denies any symptoms of palpitations.  Overall, over the past several months he does feel like his strength has improved.  No lower extremity swelling, abdominal fullness, orthopnea, PND, early satiety.  No falls.  If he ambulates for a prolonged timeframe he will use a cane.   Labs: 05/2019 - Hgb 10.2, PLT 289, potassium 3.5, BUN 24, serum creatinine 1.36 (baseline 0.6-0.9) 04/2019 - albumin 3.9, AST/ALT normal 11/2018 - TSH normal 09/2018 - magnesium 1.7 08/2018 - A1c 5.5  Past Medical History:  Diagnosis Date   Cancer [facility])    Prostate, followed by Dr. Jacqlyn Larsen   Colon polyp    Coronary artery disease    Gall stones    Hyperlipidemia    Hypertension    MI (myocardial infarction) ([place])    2015   PAF (paroxysmal atrial fibrillation) ([place])    a. on xarelto   Skin cancer     Past Surgical History:  Procedure Laterality Date   CARDIAC CATHETERIZATION  10/2013   [facility]   CATARACT EXTRACTION  Oct. 3, 2012   right eye   colonoscopy     COLONOSCOPY W/ POLYPECTOMY  2015   Dr Rayann Heman   COLONOSCOPY WITH PROPOFOL N/A 08/15/2016   Procedure: COLONOSCOPY WITH PROPOFOL;  Surgeon: [name], MD;  Location: [facility] ENDOSCOPY;  Service: Endoscopy;  Laterality: N/A;   CORONARY ANGIOPLASTY  2009   2005; s/p stent   ENTEROSCOPY N/A 09/22/2018   Procedure: ENTEROSCOPY;  Surgeon: Lin Landsman, MD;  Location[facility] ENDOSCOPY;  Service:  Gastroenterology;  Laterality: N/A;   ESOPHAGOGASTRODUODENOSCOPY (EGD) WITH PROPOFOL N/A 08/15/2016   Procedure: ESOPHAGOGASTRODUODENOSCOPY (EGD) WITH PROPOFOL;  Surgeon: [name], MD;  Location: [facility] ENDOSCOPY;  Service: Endoscopy;  Laterality: N/A;   GIVENS CAPSULE STUDY N/A 09/26/2016   Procedure: GIVENS CAPSULE STUDY;  Surgeon: [name], MD;  Location: [facility] ENDOSCOPY;  Service: Endoscopy;  Laterality: N/A;   HERNIA REPAIR  2013   LUMBAR SPINE SURGERY     UPPER GI ENDOSCOPY  Sept 2015   Dr Rayann Heman    Current Medications: Current Meds  Medication Sig   amiodarone (PACERONE) 100 MG tablet Take 1 tablet (100 mg total) by mouth daily for 30 days. (Patient taking differently: Take 50 mg by mouth daily. )   cyproheptadine (PERIACTIN) 4 MG tablet Take 4 mg by mouth 3 (three) times daily as needed.   Droxidopa (NORTHERA) 100 MG CAPS Take 200 mg by mouth 3 (three) times daily.   fenofibrate micronized (ANTARA) 130 MG capsule TAKE 1 CAPSULE BY MOUTH  DAILY BEFORE BREAKFAST   finasteride (PROSCAR) 5  MG tablet Take 1 tablet (5 mg total) by mouth daily.   fludrocortisone (FLORINEF) 0.1 MG tablet daily.    levobunolol (BETAGAN) 0.5 % ophthalmic solution Place 1 drop into the right eye 2 (two) times daily.    levothyroxine (SYNTHROID, LEVOTHROID) 50 MCG tablet Take 1 tablet (50 mcg total) by mouth daily at 6 (six) AM.   Melatonin 3 MG CAPS Take 2 capsules by mouth every evening.   midodrine (PROAMATINE) 10 MG tablet Take 1 tablet (10 mg total) by mouth 3 (three) times daily.   mirtazapine (REMERON) 15 MG tablet TAKE 1 TABLET BY MOUTH NIGHTLY   Multiple Vitamin (MULTIVITAMIN PO) Take 1 tablet by mouth daily.   nitroGLYCERIN (NITROSTAT) 0.4 MG SL tablet DISSOLVE UNDER THE TONGUE 1 TABLET EVERY 5 MINUTES AS NEEDED FOR CHEST PAIN   pantoprazole (PROTONIX) 40 MG tablet TAKE ONE TABLET BY MOUTH TWICE DAILY   pravastatin (PRAVACHOL) 40 MG tablet TAKE 1 TABLET BY MOUTH AT  BEDTIME   QC  NATURA-LAX powder MIX AND TAKE 17 GM (LINE ON INSIDE OF LID) TWICE DAILY AS NEEDED   rivaroxaban (XARELTO) 20 MG TABS tablet Take 1 tablet (20 mg total) by mouth daily.   thiamine (VITAMIN B-1) 100 MG tablet Take 100 mg by mouth daily.    Allergies:   Bee venom, Sulfasalazine, Sulfa antibiotics, and Warfarin and related   Social History   Socioeconomic History   Marital status: Married    Spouse name: Not on file   Number of children: Not on file   Years of education: Not on file   Highest education level: Not on file  Occupational History   Occupation: retired   Scientist, product/process development strain: Not hard at [employer]    Worry: Never true    Inability: Never true   Transportation needs    Medical: No    Non-medical: No  Tobacco Use   Smoking status: Never Smoker   Smokeless tobacco: Never Used  Substance and Sexual Activity   Alcohol use: No   Drug use: No   Sexual activity: Not Currently  Lifestyle   Physical activity    Days per week: Not on file    Minutes per session: Not on file   Stress: Not on file  Relationships   Social connections    Talks on phone: Not on file    Gets together: Not on file    Attends religious service: Not on file    Active member of club or organization: Not on file    Attends meetings of clubs or organizations: Not on file    Relationship status: Not on file  Other Topics Concern   Not on file  Social History Narrative   Not on file     Family History:  The patient's family history includes Colon cancer in his father; Coronary artery disease in his brother; Other (age of onset: 69) in his brother; Stroke in his mother.  ROS:   Review of Systems  Constitutional: Positive for malaise/fatigue. Negative for chills, diaphoresis, fever and weight loss.  HENT: Negative for congestion.   Eyes: Negative for discharge and redness.  Respiratory: Negative for cough, hemoptysis, sputum production,  shortness of breath and wheezing.   Cardiovascular: Positive for chest pain. Negative for palpitations, orthopnea, claudication, leg swelling and PND.  Gastrointestinal: Positive for abdominal pain. Negative for blood in stool, heartburn, melena, nausea and vomiting.  Genitourinary: Negative for hematuria.  Musculoskeletal: Negative  for falls and myalgias.  Skin: Negative for rash.  Neurological: Positive for weakness. Negative for dizziness, tingling, tremors, sensory change, speech change, focal weakness and loss of consciousness.  Endo/Heme/Allergies: Positive for polydipsia. Does not bruise/bleed easily.  Psychiatric/Behavioral: Negative for substance abuse. The patient is not nervous/anxious.      EKGs/Labs/Other Studies Reviewed:    Studies reviewed were summarized above. The additional studies were reviewed today: As above.   EKG:  EKG is ordered today.  The EKG ordered today demonstrates NSR, 95 bpm, left anterior fascicular block, poor R wave progression along the precordial leads, improved QTc, no acute ST-T changes  Recent Labs: 09/23/2018: Magnesium 1.7 12/15/2018: TSH 1.116 04/27/2019: ALT 12 06/17/2019: BUN 24; Creatinine, Ser 1.36; Hemoglobin 10.2; Platelets 289; Potassium 3.5; Sodium 140  Recent Lipid Panel    Component Value Date/Time   CHOL 110 06/28/2016 1106   CHOL 119 10/27/2013 0609   TRIG 80.0 06/28/2016 1106   TRIG 61 10/27/2013 0609   HDL 29.90 (L) 06/28/2016 1106   HDL 38 (L) 10/27/2013 0609   CHOLHDL 4 06/28/2016 1106   VLDL 16.0 06/28/2016 1106   VLDL 12 10/27/2013 0609   LDLCALC 64 06/28/2016 1106   LDLCALC 69 10/27/2013 0609    PHYSICAL EXAM:    VS:  BP 128/90 (BP Location: Left Arm, Patient Position: Sitting, Cuff Size: Normal)    Pulse 95    Ht 6\' 4"  (1.93 m)    Wt 206 lb 8 oz (93.7 kg)    SpO2 98%    BMI 25.14 kg/m   BMI: Body mass index is 25.14 kg/m.  Physical Exam  Constitutional: He is oriented to person, place, and time. He appears  well-developed and well-nourished.  HENT:  Head: Normocephalic and atraumatic.  Eyes: Right eye exhibits no discharge. Left eye exhibits no discharge.  Neck: Normal range of motion. No JVD present.  Cardiovascular: Normal rate, regular rhythm, S1 normal, S2 normal and normal heart sounds. Exam reveals no distant heart sounds, no friction rub, no midsystolic click and no opening snap.  No murmur heard. Pulses:      Posterior tibial pulses are 2+ on the right side and 2+ on the left side.  Pulmonary/Chest: Effort normal and breath sounds normal. No respiratory distress. He has no decreased breath sounds. He has no wheezes. He has no rales. He exhibits no tenderness.  Abdominal: Soft. He exhibits no distension. There is no abdominal tenderness.  Musculoskeletal:        General: Edema present.     Comments: Trace bilateral pretibial edema  Neurological: He is alert and oriented to person, place, and time.  Skin: Skin is warm and dry. No cyanosis. Nails show no clubbing.  Psychiatric: He has a normal mood and affect. His speech is normal and behavior is normal. Judgment and thought content normal.    Wt Readings from Last 3 Encounters:  06/22/19 206 lb 8 oz (93.7 kg)  04/27/19 204 lb 9.6 oz (92.8 kg)  03/02/19 200 lb (90.7 kg)     ASSESSMENT & PLAN:   1. CAD involving the native coronary arteries with stable angina: Currently symptom-free.  Patient seen in the ED with sharp abdominal/lower chest discomfort with work-up being unrevealing including CTA of the chest and abdomen to evaluate for aortopathy and normal cardiac enzymes.  Schedule Lexiscan Myoview to evaluate for high risk ischemia.  On Xarelto in place of aspirin.  Has not needed any sublingual nitroglycerin.  Would attempt to avoid escalation  of antianginal therapy with beta-blocker, calcium channel blocker, or isosorbide given his orthostasis.  He has previously been taken off ranolazine secondary to GI intolerance at  [facility].  2. PAF: He denies any symptoms of palpitations.  He does note heart rates in the 80s to low 100s bpm with sudden positional changes.  Given this we will have the patient wear a ZIO to evaluate for A. fib burden.  For now, continue current dose of amiodarone and Xarelto.  3. Orthostatic hypotension: Much improved following adequate hydration and improved diet.  Continue current dose droxidopa, fludrocortisone, and midodrine.  Continue compression stockings and slow positional changes.  4. History of recurrent PE: Remains on Xarelto.  Check CMP and CBC.  5. AKI: Check CMP.  Interestingly, the patient indicates he has been actually pushing water though renal function is slightly higher than baseline.  Further recommendations pending updated labs today.  6. Cold agglutinin hemolytic anemia: Followed by hematology.  Check CBC.  7. Hyperlipidemia: Most recent LDL at goal from 2017.  Remains on pravastatin.  Disposition: F/u with Dr. Rockey Situ in 1 month.   Medication Adjustments/Labs and Tests Ordered: Current medicines are reviewed at length with the patient today.  Concerns regarding medicines are outlined above. Medication changes, Labs and Tests ordered today are summarized above and listed in the Patient Instructions accessible in Encounters.   Signed, Christell Faith, PA-C 06/22/2019 10:06 AM     [facility] [address] [place] [address] [place], [place] [address] [phone]

## 2019-06-22 ENCOUNTER — Encounter: Payer: Self-pay | Admitting: Physician Assistant

## 2019-06-22 ENCOUNTER — Other Ambulatory Visit (INDEPENDENT_AMBULATORY_CARE_PROVIDER_SITE_OTHER): Payer: Medicare HMO

## 2019-06-22 ENCOUNTER — Other Ambulatory Visit: Payer: Self-pay

## 2019-06-22 ENCOUNTER — Ambulatory Visit (INDEPENDENT_AMBULATORY_CARE_PROVIDER_SITE_OTHER): Payer: Medicare HMO | Admitting: Physician Assistant

## 2019-06-22 ENCOUNTER — Encounter

## 2019-06-22 VITALS — BP 128/90 | HR 95 | Ht 76.0 in | Wt 206.5 lb

## 2019-06-22 DIAGNOSIS — N179 Acute kidney failure, unspecified: Secondary | ICD-10-CM

## 2019-06-22 DIAGNOSIS — Z86711 Personal history of pulmonary embolism: Secondary | ICD-10-CM | POA: Diagnosis not present

## 2019-06-22 DIAGNOSIS — I25118 Atherosclerotic heart disease of native coronary artery with other forms of angina pectoris: Secondary | ICD-10-CM | POA: Diagnosis not present

## 2019-06-22 DIAGNOSIS — D5912 Cold autoimmune hemolytic anemia: Secondary | ICD-10-CM | POA: Diagnosis not present

## 2019-06-22 DIAGNOSIS — I480 Paroxysmal atrial fibrillation: Secondary | ICD-10-CM

## 2019-06-22 DIAGNOSIS — I951 Orthostatic hypotension: Secondary | ICD-10-CM

## 2019-06-22 DIAGNOSIS — R0602 Shortness of breath: Secondary | ICD-10-CM | POA: Diagnosis not present

## 2019-06-22 MED ORDER — MIDODRINE HCL 5 MG PO TABS: 5.0000 mg | ORAL_TABLET | Freq: Three times a day (TID) | ORAL | 3 refills | Status: DC

## 2019-06-22 NOTE — Patient Instructions (Signed)
Medication Instructions:  Your physician recommends that you continue on your current medications as directed. Please refer to the Current Medication list given to you today.  *If you need a refill on your cardiac medications before your next appointment, please call your pharmacy*  Lab Work: 1- Your physician recommends that you have lab work today(CMET, CBC)  If you have labs (blood work) drawn today and your tests are completely normal, you will receive your results only by: Marland Kitchen MyChart Message (if you have MyChart) OR . A paper copy in the mail If you have any lab test that is abnormal or we need to change your treatment, we will call you to review the results.  Testing/Procedures: 1- A zio monitor was placed today. It will remain on for 14 days. You will then return monitor and event diary in provided box. It takes 1-2 weeks for report to be downloaded and returned to [place]. We will call you with the results. If monitor falls of or has orange flashing light, please call Zio for further instructions.   2- [place]  Your caregiver has ordered a Stress Test with nuclear imaging. The purpose of this test is to evaluate the blood supply to your heart muscle. This procedure is referred to as a "Non-Invasive Stress Test." This is because other than having an IV started in your vein, nothing is inserted or "invades" your body. Cardiac stress tests are done to find areas of poor blood flow to the heart by determining the extent of coronary artery disease (CAD). Some patients exercise on a treadmill, which naturally increases the blood flow to your heart, while others who are  unable to walk on a treadmill due to physical limitations have a pharmacologic/chemical stress agent called Lexiscan . This medicine will mimic walking on a treadmill by temporarily increasing your coronary blood flow.   Please note: these test may take anywhere between 2-4 hours to complete  PLEASE REPORT TO [facility]  ENTRANCE  THE VOLUNTEERS AT THE FIRST DESK WILL DIRECT YOU WHERE TO GO  Date of Procedure:_____________________________________  Arrival Time for Procedure:______________________________  Instructions regarding medication:  ____:  Hold other medications as follows:_____no medications to hold____________________________________________________________________________________________________________________________________________________________________________________________________________________________________________________________________________________  PLEASE NOTIFY THE OFFICE AT LEAST 24 HOURS IN ADVANCE IF YOU ARE UNABLE TO KEEP YOUR APPOINTMENT.  [phone] AND  PLEASE NOTIFY NUCLEAR MEDICINE AT [facility] AT LEAST 24 HOURS IN ADVANCE IF YOU ARE UNABLE TO KEEP YOUR APPOINTMENT. [phone]  How to prepare for your Myoview test:  1. Do not eat or drink after midnight 2. No caffeine for 24 hours prior to test 3. No smoking 24 hours prior to test. 4. Your medication may be taken with water.  If your doctor stopped a medication because of this test, do not take that medication. 5. Ladies, please do not wear dresses.  Skirts or pants are appropriate. Please wear a short sleeve shirt. 6. No perfume, cologne or lotion. 7. Wear comfortable walking shoes. No heels!    Follow-Up: At [facility], you and your health needs are our priority.  As part of our continuing mission to provide you with exceptional heart care, we have created designated Provider Care Teams.  These Care Teams include your primary Cardiologist (physician) and Advanced Practice Providers (APPs -  Physician Assistants and Nurse Practitioners) who all work together to provide you with the care you need, when you need it.  Your next appointment:   1 month  The format for your next appointment:  In Person  Provider:    You may see Ida Rogue, MD or Christell Faith, PA-C.

## 2019-06-23 LAB — COMPREHENSIVE METABOLIC PANEL
ALT: 8 IU/L (ref 0–44)
AST: 23 IU/L (ref 0–40)
Albumin/Globulin Ratio: 1.8 (ref 1.2–2.2)
Albumin: 4.3 g/dL (ref 3.6–4.6)
Alkaline Phosphatase: 43 IU/L (ref 39–117)
BUN/Creatinine Ratio: 16 (ref 10–24)
BUN: 20 mg/dL (ref 8–27)
Bilirubin Total: 0.8 mg/dL (ref 0.0–1.2)
CO2: 22 mmol/L (ref 20–29)
Calcium: 9.7 mg/dL (ref 8.6–10.2)
Chloride: 107 mmol/L — ABNORMAL HIGH (ref 96–106)
Creatinine, Ser: 1.29 mg/dL — ABNORMAL HIGH (ref 0.76–1.27)
GFR calc Af Amer: 57 mL/min/{1.73_m2} — ABNORMAL LOW (ref >59)
GFR calc non Af Amer: 50 mL/min/{1.73_m2} — ABNORMAL LOW (ref >59)
Globulin, Total: 2.4 g/dL (ref 1.5–4.5)
Glucose: 127 mg/dL — ABNORMAL HIGH (ref 65–99)
Potassium: 4.1 mmol/L (ref 3.5–5.2)
Sodium: 144 mmol/L (ref 134–144)
Total Protein: 6.7 g/dL (ref 6.0–8.5)

## 2019-06-23 LAB — CBC
Hematocrit: 33 % — ABNORMAL LOW (ref 37.5–51.0)
Hemoglobin: 10.7 g/dL — ABNORMAL LOW (ref 13.0–17.7)
MCH: 32.1 pg (ref 26.6–33.0)
MCHC: 32.4 g/dL (ref 31.5–35.7)
MCV: 99 fL — ABNORMAL HIGH (ref 79–97)
Platelets: 393 10*3/uL (ref 150–450)
RBC: 3.33 x10E6/uL — ABNORMAL LOW (ref 4.14–5.80)
RDW: 18.7 % — ABNORMAL HIGH (ref 11.6–15.4)
WBC: 6.7 10*3/uL (ref 3.4–10.8)

## 2019-06-23 NOTE — Telephone Encounter (Signed)
-----   Message from Rise Mu, PA-C sent at 06/23/2019  9:39 AM EST ----- Renal function remains elevated, though consistent with recent readings over the past month. Potassium is at goal.  Blood count is pending.  Please ensure he is remaining hydrated.

## 2019-06-23 NOTE — Telephone Encounter (Signed)
Call to patient to discuss results. No answer, LMOM.

## 2019-06-29 DIAGNOSIS — M9902 Segmental and somatic dysfunction of thoracic region: Secondary | ICD-10-CM | POA: Diagnosis not present

## 2019-06-29 DIAGNOSIS — M9903 Segmental and somatic dysfunction of lumbar region: Secondary | ICD-10-CM | POA: Diagnosis not present

## 2019-06-29 DIAGNOSIS — M5136 Other intervertebral disc degeneration, lumbar region: Secondary | ICD-10-CM | POA: Diagnosis not present

## 2019-06-29 DIAGNOSIS — M5125 Other intervertebral disc displacement, thoracolumbar region: Secondary | ICD-10-CM | POA: Diagnosis not present

## 2019-07-02 ENCOUNTER — Ambulatory Visit: Payer: Medicare HMO | Admitting: Family Medicine

## 2019-07-02 ENCOUNTER — Telehealth: Payer: Self-pay | Admitting: Cardiovascular Disease

## 2019-07-02 ENCOUNTER — Ambulatory Visit: Payer: Medicare HMO

## 2019-07-02 ENCOUNTER — Telehealth: Payer: Self-pay

## 2019-07-02 NOTE — Telephone Encounter (Signed)
Called to complete annual wellness appointment. No answer. Left message to call the office back. Reschedule as appropriate.

## 2019-07-02 NOTE — Telephone Encounter (Signed)
Blood pressure measurements below, Midodrine can cause pressures to go high when he lays supine Can we find out if numbers detailed below were him on the midodrine?  In general he should not be laying supine when he is taking midodrine We need more blood pressure measurements with him sitting in a chair during the daytime Appeared fine on recent office visit  If his plan is to take a nap in the daytime, he may need to do this in the recliner or avoid taking midodrine 4 to 6 hours before his nap

## 2019-07-02 NOTE — Telephone Encounter (Signed)
Pt c/o BP issue: STAT if pt c/o blurred vision, one-sided weakness or slurred speech  1. What are your last 5 BP readings?   This morning 202/114 laying down, 191/118 a few minutes later, Sitting 180/100 standing 148/102  2. Are you having any other symptoms (ex. Dizziness, headache, blurred vision, passed out)? No other symptoms  3. What is your BP issue? elavated  Patient currently has a heart monitor on.  Patient wife states his medication,  Midodrine, has been adjusted but not sure who changed it.

## 2019-07-02 NOTE — Telephone Encounter (Signed)
I spoke with Mrs. Bubeck. She states she has not been checking the patient's BP as often, but has been doing this a little more since the patient started wearing his heart monitor on 06/22/19. See lying/ sitting/ standing readings as listed below.  Per Mrs. Graybill, the patient lying SBP readings are often in the 197-198 range.  He does feel tired all the time.  He is also complaining of intermittent pounding headaches.   I confirmed with the patient's wife that he is still taking: - Northera 200 mg TID (w/ meals) - Midodrine 5 mg TID- was decreased from 10 mg once daily at his ER visit on 10/29 - Florinef 0.1 mg QD - Finasteride 5 mg QD  I have advised Mrs. Baroni I will forward to Dr. Rockey Situ to review and we will call back with any further recommendations.

## 2019-07-03 ENCOUNTER — Other Ambulatory Visit: Payer: Self-pay | Admitting: Family Medicine

## 2019-07-03 ENCOUNTER — Other Ambulatory Visit: Payer: Self-pay | Admitting: Urology

## 2019-07-03 DIAGNOSIS — Z760 Encounter for issue of repeat prescription: Secondary | ICD-10-CM

## 2019-07-05 ENCOUNTER — Ambulatory Visit: Payer: Medicare HMO | Admitting: Cardiovascular Disease

## 2019-07-07 NOTE — Telephone Encounter (Signed)
Spoke with patients wife per release form and reviewed provider recommendations. She states that his Midodrine was decreased and he has been resting sitting in his office chair or recliner. She states that he has been doing better and reviewed that Dr. Rockey Situ did recommend he hold his midodrine dose if he knows when he will be napping. She verbalized understanding. Instructed her to continue monitoring his blood pressures and to give [place] a call back if his symptoms persist or worsen. She verbalized understanding and had no further questions at this time.

## 2019-07-13 ENCOUNTER — Other Ambulatory Visit: Payer: Self-pay

## 2019-07-13 ENCOUNTER — Telehealth: Payer: Self-pay

## 2019-07-13 ENCOUNTER — Ambulatory Visit
Admission: RE | Admit: 2019-07-13 | Discharge: 2019-07-13 | Disposition: A | Discharge status: Home / Self Care | Payer: Medicare HMO | Source: Ambulatory Visit | Attending: Physician Assistant | Admitting: Physician Assistant

## 2019-07-13 DIAGNOSIS — I480 Paroxysmal atrial fibrillation: Secondary | ICD-10-CM | POA: Insufficient documentation

## 2019-07-13 LAB — NM MYOCAR MULTI W/SPECT W/WALL MOTION / EF
LV dias vol: 84 mL (ref 62–150)
LV sys vol: 26 mL
Peak HR: 85 {beats}/min
Percent HR: 63 %
Rest HR: 75 {beats}/min
TID: 1.02

## 2019-07-13 MED ORDER — REGADENOSON 0.4 MG/5ML IV SOLN
0.4000 mg | Freq: Once | INTRAVENOUS | Status: AC
  Administered 2019-07-13: 0.4 mg via INTRAVENOUS

## 2019-07-13 MED ORDER — TECHNETIUM TC 99M TETROFOSMIN IV KIT
10.0000 | PACK | Freq: Once | INTRAVENOUS | Status: AC | PRN
  Administered 2019-07-13: 10.47 via INTRAVENOUS

## 2019-07-13 MED ORDER — TECHNETIUM TC 99M TETROFOSMIN IV KIT
33.0800 | PACK | Freq: Once | INTRAVENOUS | Status: AC | PRN
  Administered 2019-07-13: 33.08 via INTRAVENOUS

## 2019-07-13 NOTE — Telephone Encounter (Signed)
Attempted to call patient. LMTCB 07/13/2019

## 2019-07-13 NOTE — Telephone Encounter (Signed)
-----   Message from Rise Mu, PA-C sent at 07/13/2019  3:12 PM EST ----- Stress test showed no significant ischemia There was a small in size, mild in severity fixed defect involving a small portion of the heart which likely represented artifact though given patient's location of prior PCI cannot exclude scar Normal pump function of the heart CT images showed coronary artery calcification Overall, this was a low risk study  Reassuring stress test Patient sees his primary cardiologist in the morning on 12/8, hopefully, a lipid panel can be obtained at that time

## 2019-07-16 DIAGNOSIS — R002 Palpitations: Secondary | ICD-10-CM | POA: Diagnosis not present

## 2019-07-20 NOTE — Telephone Encounter (Signed)
Attempted to call patient. LMTCB 07/20/2019

## 2019-07-21 NOTE — Telephone Encounter (Signed)
Call to patient to discuss results of stress test.   Pt verbalized understanding and has no further questions at this time.  No further orders.  Confirmed upcoming appt on 12/8.  Advised pt to call for any further questions or concerns.

## 2019-07-26 ENCOUNTER — Ambulatory Visit: Payer: Medicare HMO | Admitting: Family Medicine

## 2019-07-26 NOTE — Progress Notes (Signed)
Date:  07/27/2019   ID:  William Taylor, DOB 05/11/1930, MRN [id number]  Patient Location:  [place] [address]   Provider location:   [facility], [place] office  PCP:  [name], MD  Cardiologist:  [facility]  Chief Complaint  Patient presents with  . other    1 month follow up and discuss Zio monitor and Myoview. Meds reviewed by the pt. verbally. Pt. c/o irreg. heart beats,chest pressure and shortness of breeath.     History of Present Illness:    William Taylor is a 85 y.o. male  past medical history of coronary artery disease, stent in 2005,  atrial fibrillation with RVR in the setting of UTI September 2011,  stent placement May 2012 to the OM 2,  PE following catheterization started on anticoagulation, atrial fibrillation in 2013,  Last atrial fibrillation July 2018  atypical chest pain July 2018 History of anemia who presents for follow-up of his coronary disease and atrial fibrillation  Normal echo 08/2018 Results reviewed with him  Monitor zio for 2 weeks, results reviewed avg HR of 85 bpm. Predominant underlying rhythm was Sinus Rhythm. 6 Supraventricular Tachycardia runs occurred, the run with the fastest interval lasting 7 beats with a max rate of 197 bpm, the longest lasting 18 beats with an avg rate of 126 bpm. Isolated SVEs were occasional (1.2%, 19914), SVE Couplets were rare (<1.0%, 14), and no SVE Triplets were present. Isolated VEs were rare (<1.0%), VE Couplets were rare (<1.0%), and no VE Triplets were present.  Oxygen at home "98%" on home monitoring   Weight up 13 pounds BP elevated Orthostasis symptoms resolved  Continues to take  Amiodarone 200 daily northera 200 TID Florinef: 0.1 mg once a day Midodrine: 5 mg three times a day  Off ranexa On remeron, eating better, sleeping better Aslo taking B vitamin, thiamine, MVI Nausea resolved through medication changes after long [facility]  hospitalization,   EKG personally reviewed by myself on todays visit Shows normal sinus rhythm rate 85 bpm no significant ST-T wave changes  Prior CV studies:   The following studies were reviewed today:   Past Medical History:  Diagnosis Date  . Cancer [facility])    Prostate, followed by Dr. Jacqlyn Larsen  . Colon polyp   . Coronary artery disease   . Gall stones   . Hyperlipidemia   . Hypertension   . MI (myocardial infarction) ([place])    2015  . PAF (paroxysmal atrial fibrillation) ([place])    a. on xarelto  . Skin cancer    Past Surgical History:  Procedure Laterality Date  . CARDIAC CATHETERIZATION  10/[facility]   [facility]  . CATARACT EXTRACTION  Oct. 3, 2012   right eye  . colonoscopy    . COLONOSCOPY W/ POLYPECTOMY  2015   Dr Rayann Heman  . COLONOSCOPY WITH PROPOFOL N/A 08/15/2016   Procedure: COLONOSCOPY WITH PROPOFOL;  Surgeon: [name], MD;  [facility] ENDOSCOPY;  Service: Endoscopy;  Laterality: N/A;  . CORONARY ANGIOPLASTY  2009   2005; s/p stent  . ENTEROSCOPY N/A 09/22/2018   Procedure: ENTEROSCOPY;  Surgeon: Lin Landsman, MD;  Location: [place];  Service: Gastroenterology;  Laterality: N/A;  . ESOPHAGOGASTRODUODENOSCOPY (EGD) WITH PROPOFOL N/A 08/15/2016   Procedure: ESOPHAGOGASTRODUODENOSCOPY (EGD) WITH PROPOFOL;  Surgeon: [name], MD;  Location: [facility] ENDOSCOPY;  Service: Endoscopy;  Laterality: N/A;  . GIVENS CAPSULE STUDY N/A 09/26/2016   Procedure: GIVENS CAPSULE  STUDY;  Surgeon: [name], MD;  Location: [facility] ENDOSCOPY;  Service: Endoscopy;  Laterality: N/A;  . HERNIA REPAIR  2013  . LUMBAR SPINE SURGERY    . UPPER GI ENDOSCOPY  Sept 2015   Dr Rayann Heman     Current Meds  Medication Sig  . amiodarone (PACERONE) 100 MG tablet Take 1 tablet (100 mg total) by mouth daily for 30 days. (Patient taking differently: Take 50 mg by mouth daily. )  . cyproheptadine (PERIACTIN) 4 MG tablet Take 4 mg by mouth 3 (three) times daily as needed.  . Droxidopa (NORTHERA) 100 MG CAPS  Take 200 mg by mouth 3 (three) times daily.  . fenofibrate micronized (ANTARA) 130 MG capsule TAKE 1 CAPSULE BY MOUTH  DAILY BEFORE BREAKFAST  . finasteride (PROSCAR) 5 MG tablet TAKE 1 TABLET BY MOUTH  DAILY  . fludrocortisone (FLORINEF) 0.1 MG tablet daily.   Marland Kitchen levobunolol (BETAGAN) 0.5 % ophthalmic solution Place 1 drop into the right eye 2 (two) times daily.   . Melatonin 3 MG CAPS Take 2 capsules by mouth every evening.  . mirtazapine (REMERON) 15 MG tablet TAKE 1 TABLET BY MOUTH NIGHTLY  . Multiple Vitamin (MULTIVITAMIN PO) Take 1 tablet by mouth daily.  . nitroGLYCERIN (NITROSTAT) 0.4 MG SL tablet DISSOLVE UNDER THE TONGUE 1 TABLET EVERY 5 MINUTES AS NEEDED FOR CHEST PAIN  . pantoprazole (PROTONIX) 40 MG tablet TAKE ONE TABLET BY MOUTH TWICE DAILY  . pravastatin (PRAVACHOL) 40 MG tablet TAKE 1 TABLET BY MOUTH AT  BEDTIME  . rivaroxaban (XARELTO) 20 MG TABS tablet Take 1 tablet (20 mg total) by mouth daily.  Marland Kitchen thiamine (VITAMIN B-1) 100 MG tablet Take 100 mg by mouth daily.  . [DISCONTINUED] midodrine (PROAMATINE) 5 MG tablet Take 1 tablet (5 mg total) by mouth 3 (three) times daily.     Allergies:   Bee venom, Sulfasalazine, Sulfa antibiotics, and Warfarin and related   Social History   Tobacco Use  . Smoking status: Never Smoker  . Smokeless tobacco: Never Used  Substance Use Topics  . Alcohol use: No  . Drug use: No     Current Outpatient Medications on File Prior to Visit  Medication Sig Dispense Refill  . amiodarone (PACERONE) 100 MG tablet Take 1 tablet (100 mg total) by mouth daily for 30 days. (Patient taking differently: Take 50 mg by mouth daily. ) 30 tablet 0  . cyproheptadine (PERIACTIN) 4 MG tablet Take 4 mg by mouth 3 (three) times daily as needed.    . Droxidopa (NORTHERA) 100 MG CAPS Take 200 mg by mouth 3 (three) times daily.    . fenofibrate micronized (ANTARA) 130 MG capsule TAKE 1 CAPSULE BY MOUTH  DAILY BEFORE BREAKFAST 90 capsule 3  . finasteride  (PROSCAR) 5 MG tablet TAKE 1 TABLET BY MOUTH  DAILY 90 tablet 0  . fludrocortisone (FLORINEF) 0.1 MG tablet daily.     Marland Kitchen levobunolol (BETAGAN) 0.5 % ophthalmic solution Place 1 drop into the right eye 2 (two) times daily.     . Melatonin 3 MG CAPS Take 2 capsules by mouth every evening.    . mirtazapine (REMERON) 15 MG tablet TAKE 1 TABLET BY MOUTH NIGHTLY 30 tablet 3  . Multiple Vitamin (MULTIVITAMIN PO) Take 1 tablet by mouth daily.    . nitroGLYCERIN (NITROSTAT) 0.4 MG SL tablet DISSOLVE UNDER THE TONGUE 1 TABLET EVERY 5 MINUTES AS NEEDED FOR CHEST PAIN 25 tablet 0  . pantoprazole (PROTONIX) 40 MG tablet TAKE  ONE TABLET BY MOUTH TWICE DAILY 60 tablet 2  . pravastatin (PRAVACHOL) 40 MG tablet TAKE 1 TABLET BY MOUTH AT  BEDTIME 90 tablet 3  . rivaroxaban (XARELTO) 20 MG TABS tablet Take 1 tablet (20 mg total) by mouth daily. 90 tablet 1  . thiamine (VITAMIN B-1) 100 MG tablet Take 100 mg by mouth daily.     No current facility-administered medications on file prior to visit.      Family Hx: The patient's family history includes Colon cancer in his father; Coronary artery disease in his brother; Other (age of onset: 39) in his brother; Stroke in his mother.  ROS:   Please see the history of present illness.    Review of Systems  Constitutional: Positive for malaise/fatigue.  Respiratory: Negative.   Cardiovascular: Negative.   Gastrointestinal: Negative.   Musculoskeletal: Negative.   Neurological: Positive for dizziness.  Psychiatric/Behavioral: Negative.   All other systems reviewed and are negative.   Labs/Other Tests and Data Reviewed:    Recent Labs: 09/23/2018: Magnesium 1.7 12/15/2018: TSH 1.116 06/22/2019: ALT 8; BUN 20; Creatinine, Ser 1.29; Hemoglobin 10.7; Platelets 393; Potassium 4.1; Sodium 144   Recent Lipid Panel Lab Results  Component Value Date/Time   CHOL 110 06/28/2016 11:06 AM   CHOL 119 10/27/2013 06:09 AM   TRIG 80.0 06/28/2016 11:06 AM   TRIG 61  10/27/2013 06:09 AM   HDL 29.90 (L) 06/28/2016 11:06 AM   HDL 38 (L) 10/27/2013 06:09 AM   CHOLHDL 4 06/28/2016 11:06 AM   LDLCALC 64 06/28/2016 11:06 AM   LDLCALC 69 10/27/2013 06:09 AM    Wt Readings from Last 3 Encounters:  07/27/19 210 lb 8 oz (95.5 kg)  06/22/19 206 lb 8 oz (93.7 kg)  04/27/19 204 lb 9.6 oz (92.8 kg)     Exam:    BP (!) 164/100 (BP Location: Left Arm, Patient Position: Sitting, Cuff Size: Normal)   Pulse 85   Ht 6\' 4"  (1.93 m)   Wt 210 lb 8 oz (95.5 kg)   BMI 25.62 kg/m   Constitutional:  oriented to person, place, and time. No distress.  HENT:  Head: Grossly normal Eyes:  no discharge. No scleral icterus.  Neck: No JVD, no carotid bruits  Cardiovascular: Regular rate and rhythm, no murmurs appreciated Pulmonary/Chest: Clear to auscultation bilaterally, no wheezes or rails Abdominal: Soft.  no distension.  no tenderness.  Musculoskeletal: Normal range of motion Neurological:  normal muscle tone. Coordination normal. No atrophy Skin: Skin warm and dry Psychiatric: normal affect, pleasant   ASSESSMENT & PLAN:    Severe orthostasis on the Northera with Florinef, midodrine 5mg   3 times daily BP elevated, consider midodrine 2.5 TID He will play with this at home Likely improved numbers after a 13 pound weight gain  Mixed hyperlipidemia Cholesterol is at goal on the current lipid regimen. No changes to the medications were made. Stable  Coronary artery disease involving native coronary artery of native heart without angina pectoris stress test no ischemia,  No anginal symptoms, normal echocardiogram, no further testing  Atrial fibrillation, unspecified type (HCC) on Xarelto Continue low-dose amiodarone Recent event monitor no significant atrial fibrillation  Chronic nausea Resolved, weight up 13 pounds Stable weight  Depression Improved on remeron He reports having 40 pound weight loss in the past, now stabilized with weight increase  and better blood pressure  Chronic kidney disease (CKD), stage III (moderate) Stable, cr 1.27  Iron deficiency anemia, unspecified iron deficiency anemia type HCT  33, stable    Total encounter time more than 25 minutes  Greater than 50% was spent in counseling and coordination of care with the patient   Disposition: Follow-up in 6 months   Signed, Ida Rogue, MD  07/27/2019 1:21 PM    [place] [id number], [place], [place] [address]

## 2019-07-27 ENCOUNTER — Other Ambulatory Visit: Payer: Self-pay

## 2019-07-27 ENCOUNTER — Ambulatory Visit (INDEPENDENT_AMBULATORY_CARE_PROVIDER_SITE_OTHER): Payer: Medicare HMO | Admitting: Cardiovascular Disease

## 2019-07-27 ENCOUNTER — Encounter: Payer: Self-pay | Admitting: Cardiovascular Disease

## 2019-07-27 VITALS — BP 164/100 | HR 85 | Ht 76.0 in | Wt 210.5 lb

## 2019-07-27 DIAGNOSIS — I25118 Atherosclerotic heart disease of native coronary artery with other forms of angina pectoris: Secondary | ICD-10-CM

## 2019-07-27 DIAGNOSIS — E782 Mixed hyperlipidemia: Secondary | ICD-10-CM | POA: Diagnosis not present

## 2019-07-27 DIAGNOSIS — Z86711 Personal history of pulmonary embolism: Secondary | ICD-10-CM | POA: Diagnosis not present

## 2019-07-27 DIAGNOSIS — N179 Acute kidney failure, unspecified: Secondary | ICD-10-CM | POA: Diagnosis not present

## 2019-07-27 DIAGNOSIS — I480 Paroxysmal atrial fibrillation: Secondary | ICD-10-CM

## 2019-07-27 DIAGNOSIS — C61 Malignant neoplasm of prostate: Secondary | ICD-10-CM

## 2019-07-27 MED ORDER — MIDODRINE HCL 2.5 MG PO TABS: 2.5000 mg | ORAL_TABLET | Freq: Three times a day (TID) | ORAL | 3 refills | Status: DC

## 2019-07-27 NOTE — Patient Instructions (Addendum)
Refills on cardiac meds   Medication Instructions:  Your physician has recommended you make the following change in your medication:  1. START Midodrine 2.5 mg and take 1 to 2 tablets 3 times a day  Continue other medications  If you need a refill on your cardiac medications before your next appointment, please call your pharmacy.    Lab work: No new labs needed   If you have labs (blood work) drawn today and your tests are completely normal, you will receive your results only by: Marland Kitchen MyChart Message (if you have MyChart) OR . A paper copy in the mail If you have any lab test that is abnormal or we need to change your treatment, we will call you to review the results.   Testing/Procedures: No new testing needed   Follow-Up: At [facility], you and your health needs are our priority.  As part of our continuing mission to provide you with exceptional heart care, we have created designated Provider Care Teams.  These Care Teams include your primary Cardiologist (physician) and Advanced Practice Providers (APPs -  Physician Assistants and Nurse Practitioners) who all work together to provide you with the care you need, when you need it.  . You will need a follow up appointment in 6 months .   Please call our office 2 months in advance to schedule this appointment.    . Providers on your designated Care Team:   . Murray Hodgkins, NP . Christell Faith, PA-C . Marrianne Mood, PA-C  Any Other Special Instructions Will Be Listed Below (If Applicable).  For educational health videos Log in to : [email or web address] Or : [email or web address], password : [email or web address]

## 2019-07-28 ENCOUNTER — Other Ambulatory Visit: Payer: Medicare HMO

## 2019-07-28 DIAGNOSIS — C61 Malignant neoplasm of prostate: Secondary | ICD-10-CM | POA: Diagnosis not present

## 2019-07-29 LAB — PSA: Prostate Specific Ag, Serum: <0.1 ng/mL (ref 0.0–4.0)

## 2019-08-02 ENCOUNTER — Other Ambulatory Visit: Payer: Self-pay

## 2019-08-02 ENCOUNTER — Ambulatory Visit: Payer: Medicare HMO | Admitting: Urology

## 2019-08-02 ENCOUNTER — Encounter: Payer: Self-pay | Admitting: Urology

## 2019-08-02 VITALS — BP 181/99 | HR 99 | Ht 76.0 in | Wt 209.1 lb

## 2019-08-02 DIAGNOSIS — C61 Malignant neoplasm of prostate: Secondary | ICD-10-CM

## 2019-08-02 MED ORDER — FINASTERIDE 5 MG PO TABS: 5.0000 mg | ORAL_TABLET | Freq: Every day | ORAL | 3 refills | Status: DC

## 2019-08-02 NOTE — Progress Notes (Signed)
08/02/2019 11:11 AM   William Taylor August 01, 1930 [id number]  Referring provider: [name], MD [address] [place],  [place] [address]  Chief Complaint  Patient presents with  . Prostate Cancer     Urologic history: 1. 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 Jacqlyn Larsen; PSA 5.7; 77 g               -focus Gleason 3+3 RLB             -Elected active surveillance; started finasteride             -Baseline PSA 0.1-0.6 since 2015  HPI: 85 y.o. male presents for annual follow-up prostate cancer.  He has continued to do well and denies bothersome lower urinary tract symptoms.  He has nocturia x2.  He remains on finasteride and stopped his tamsulosin last year without any worsening of his lower urinary tract symptoms.  Denies dysuria or gross hematuria.  He has no flank, abdominal or pelvic pain.  PSA performed on 07/28/2019 was <0.1.   PMH: Past Medical History:  Diagnosis Date  . Cancer [facility])    Prostate, followed by Dr. Jacqlyn Larsen  . Colon polyp   . Coronary artery disease   . Gall stones   . Hyperlipidemia   . Hypertension   . MI (myocardial infarction) ([place])    2015  . PAF (paroxysmal atrial fibrillation) ([place])    a. on xarelto  . Skin cancer     Surgical History: Past Surgical History:  Procedure Laterality Date  . CARDIAC CAT[facility]IZATION  [facility]   [facility]  . CATARACT EXTRACTION  Oct. 3, 2012   right eye  . colonoscopy    . COLONOSCOPY W/ POLYPECTOMY  2015   Dr Rayann Heman  . COLONOSCOPY WITH PROPOFOL N/A 08/15/2016   Procedure: COLONOSCOPY WITH PROPOFOL;  Surgeon: [name], MD;  Location[facility] ENDOSCOPY;  Service: Endoscopy;  Laterality: N/A;  . CORONARY ANGIOPLASTY  2009   2005; s/p stent  . ENTEROSCOPY N/A 09/22/2018   Procedure: ENTEROSCOPY;  Surgeon: Lin Landsman, MD;  Location: [place];  Service: Gastroenterology;  Laterality: N/A;  . ESOPHAGOGASTRODUODENOSCOPY (EGD) WITH PROPOFOL N/A 08/15/2016    Procedure: ESOPHAGOGASTRODUODENOSCOPY (EGD) WITH PROPOFOL;  Surgeon: [name], MD;  Location: [facility] ENDOSCOPY;  Service: Endoscopy;  Laterality: N/A;  . GIVENS CAPSULE STUDY N/A 09/26/2016   Procedure: GIVENS CAPSULE STUDY;  Surgeon: [name], MD;  Location: [facility] ENDOSCOPY;  Service: Endoscopy;  Laterality: N/A;  . HERNIA REPAIR  2013  . LUMBAR SPINE SURGERY    . UPPER GI ENDOSCOPY  Sept 2015   Dr Rudean Hitt Medications:  Allergies as of 08/02/2019      Reactions   Bee Venom Swelling   Sulfasalazine Hives, Swelling   Sulfa Antibiotics Hives, Swelling   Warfarin And Related Other (See Comments)   Chest pain      Medication List       Accurate as of August 02, 2019 11:11 AM. If you have any questions, ask your nurse or doctor.        amiodarone 100 MG tablet Commonly known as: PACERONE Take 1 tablet (100 mg total) by mouth daily for 30 days. What changed: how much to take   cyproheptadine 4 MG tablet Commonly known as: PERIACTIN Take 4 mg by mouth 3 (three) times daily as needed.   fenofibrate micronized  130 MG capsule Commonly known as: ANTARA TAKE 1 CAPSULE BY MOUTH  DAILY BEFORE BREAKFAST   finasteride 5 MG tablet Commonly known as: PROSCAR TAKE 1 TABLET BY MOUTH  DAILY   fludrocortisone 0.1 MG tablet Commonly known as: FLORINEF daily.   levobunolol 0.5 % ophthalmic solution Commonly known as: BETAGAN Place 1 drop into the right eye 2 (two) times daily.   Melatonin 3 MG Caps Take 2 capsules by mouth every evening.   midodrine 2.5 MG tablet Commonly known as: PROAMATINE Take 1-2 tablets (2.5-5 mg total) by mouth 3 (three) times daily with meals.   mirtazapine 15 MG tablet Commonly known as: REMERON TAKE 1 TABLET BY MOUTH NIGHTLY   MULTIVITAMIN PO Take 1 tablet by mouth daily.   nitroGLYCERIN 0.4 MG SL tablet Commonly known as: NITROSTAT DISSOLVE UNDER THE TONGUE 1 TABLET EVERY 5 MINUTES AS NEEDED FOR CHEST PAIN   Northera 100 MG  Caps Generic drug: Droxidopa Take 200 mg by mouth 3 (three) times daily.   pantoprazole 40 MG tablet Commonly known as: PROTONIX TAKE ONE TABLET BY MOUTH TWICE DAILY   pravastatin 40 MG tablet Commonly known as: PRAVACHOL TAKE 1 TABLET BY MOUTH AT  BEDTIME   rivaroxaban 20 MG Tabs tablet Commonly known as: Xarelto Take 1 tablet (20 mg total) by mouth daily.   thiamine 100 MG tablet Commonly known as: VITAMIN B-1 Take 100 mg by mouth daily.       Allergies:  Allergies  Allergen Reactions  . Bee Venom Swelling  . Sulfasalazine Hives and Swelling  . Sulfa Antibiotics Hives and Swelling  . Warfarin And Related Other (See Comments)    Chest pain    Family History: Family History  Problem Relation Age of Onset  . Stroke Mother   . Colon cancer Father   . Coronary artery disease Brother   . Other Brother 12       lymes dz    Social History:  reports that he has never smoked. He has never used smokeless tobacco. He reports that he does not drink alcohol or use drugs.  ROS: UROLOGY Frequent Urination?: No Hard to postpone urination?: No Burning/pain with urination?: No Get up at night to urinate?: Yes Leakage of urine?: No Urine stream starts and stops?: No Trouble starting stream?: No Do you have to strain to urinate?: No Blood in urine?: No Urinary tract infection?: No Sexually transmitted disease?: No Injury to kidneys or bladder?: No Painful intercourse?: No Weak stream?: No Erection problems?: No Penile pain?: No  Gastrointestinal Nausea?: No Vomiting?: No Indigestion/heartburn?: No Diarrhea?: No Constipation?: No  Constitutional Fever: No Night sweats?: No Weight loss?: No Fatigue?: No  Skin Skin rash/lesions?: No Itching?: No  Eyes Blurred vision?: No Double vision?: No  Ears/Nose/Throat Sore throat?: No Sinus problems?: No  Hematologic/Lymphatic Swollen glands?: No Easy bruising?: No  Cardiovascular Leg swelling?: Yes Chest  pain?: Yes  Respiratory Cough?: No Shortness of breath?: Yes  Endocrine Excessive thirst?: No  Musculoskeletal Back pain?: No Joint pain?: No  Neurological Headaches?: No Dizziness?: No  Psychologic Depression?: No Anxiety?: No  Physical Exam: BP (!) 181/99 (BP Location: Left Arm, Patient Position: Sitting, Cuff Size: Normal)   Pulse 99   Ht 6\' 4"  (1.93 m)   Wt 209 lb 1.6 oz (94.8 kg)   BMI 25.45 kg/m   Constitutional:  Alert, No acute distress. HEENT: [place] AT, moist mucus membranes.  Trachea midline, no masses. Cardiovascular: No clubbing, cyanosis, or edema. Respiratory: Normal  respiratory effort, no increased work of breathing. Lymph: No cervical or inguinal lymphadenopathy. Skin: No rashes, bruises or suspicious lesions. Neurologic: Grossly intact, no focal deficits, moving all 4 extremities. Psychiatric: Normal mood and affect.   Assessment & Plan:    - Prostate cancer T1c very low risk with low PSA.  He desires to continue surveillance and will continue annual follow-up.  Finasteride was refilled.     Abbie Sons, [place] [address] [place], [place] [address] [phone]

## 2019-08-09 DIAGNOSIS — H903 Sensorineural hearing loss, bilateral: Secondary | ICD-10-CM | POA: Diagnosis not present

## 2019-08-09 DIAGNOSIS — H6121 Impacted cerumen, right ear: Secondary | ICD-10-CM | POA: Diagnosis not present

## 2019-08-16 ENCOUNTER — Ambulatory Visit: Payer: Medicare HMO | Admitting: Urology

## 2019-08-17 DIAGNOSIS — X32XXXA Exposure to sunlight, initial encounter: Secondary | ICD-10-CM | POA: Diagnosis not present

## 2019-08-17 DIAGNOSIS — D2271 Melanocytic nevi of right lower limb, including hip: Secondary | ICD-10-CM | POA: Diagnosis not present

## 2019-08-17 DIAGNOSIS — Z85828 Personal history of other malignant neoplasm of skin: Secondary | ICD-10-CM | POA: Diagnosis not present

## 2019-08-17 DIAGNOSIS — D2262 Melanocytic nevi of left upper limb, including shoulder: Secondary | ICD-10-CM | POA: Diagnosis not present

## 2019-08-17 DIAGNOSIS — D2261 Melanocytic nevi of right upper limb, including shoulder: Secondary | ICD-10-CM | POA: Diagnosis not present

## 2019-08-17 DIAGNOSIS — L570 Actinic keratosis: Secondary | ICD-10-CM | POA: Diagnosis not present

## 2019-08-23 ENCOUNTER — Other Ambulatory Visit: Payer: Self-pay | Admitting: Gastroenterology

## 2019-08-26 NOTE — Progress Notes (Signed)
Patient has done e check in states everything is the same and has no questions or concerns. No to all covid questions

## 2019-08-27 ENCOUNTER — Other Ambulatory Visit: Payer: Self-pay

## 2019-08-27 ENCOUNTER — Inpatient Hospital Stay: Payer: Medicare HMO | Attending: Oncology

## 2019-08-27 ENCOUNTER — Encounter: Payer: Self-pay | Admitting: Oncology

## 2019-08-27 ENCOUNTER — Inpatient Hospital Stay: Payer: Medicare HMO | Admitting: Oncology

## 2019-08-27 VITALS — BP 164/116 | HR 86 | Temp 96.0°F | Resp 16 | Wt 211.5 lb

## 2019-08-27 DIAGNOSIS — D539 Nutritional anemia, unspecified: Secondary | ICD-10-CM | POA: Diagnosis not present

## 2019-08-27 DIAGNOSIS — G8929 Other chronic pain: Secondary | ICD-10-CM | POA: Diagnosis not present

## 2019-08-27 DIAGNOSIS — Z8546 Personal history of malignant neoplasm of prostate: Secondary | ICD-10-CM | POA: Diagnosis not present

## 2019-08-27 DIAGNOSIS — N183 Chronic kidney disease, stage 3 unspecified: Secondary | ICD-10-CM | POA: Insufficient documentation

## 2019-08-27 DIAGNOSIS — M549 Dorsalgia, unspecified: Secondary | ICD-10-CM | POA: Insufficient documentation

## 2019-08-27 DIAGNOSIS — Z85828 Personal history of other malignant neoplasm of skin: Secondary | ICD-10-CM | POA: Insufficient documentation

## 2019-08-27 DIAGNOSIS — Z86010 Personal history of colonic polyps: Secondary | ICD-10-CM | POA: Insufficient documentation

## 2019-08-27 DIAGNOSIS — Z800 Family history of malignant neoplasm of digestive organs: Secondary | ICD-10-CM | POA: Diagnosis not present

## 2019-08-27 DIAGNOSIS — I252 Old myocardial infarction: Secondary | ICD-10-CM | POA: Diagnosis not present

## 2019-08-27 DIAGNOSIS — E785 Hyperlipidemia, unspecified: Secondary | ICD-10-CM | POA: Insufficient documentation

## 2019-08-27 DIAGNOSIS — D5912 Cold autoimmune hemolytic anemia: Secondary | ICD-10-CM | POA: Diagnosis not present

## 2019-08-27 DIAGNOSIS — I129 Hypertensive chronic kidney disease with stage 1 through stage 4 chronic kidney disease, or unspecified chronic kidney disease: Secondary | ICD-10-CM | POA: Insufficient documentation

## 2019-08-27 DIAGNOSIS — I480 Paroxysmal atrial fibrillation: Secondary | ICD-10-CM | POA: Insufficient documentation

## 2019-08-27 LAB — CBC WITH DIFFERENTIAL/PLATELET
Abs Immature Granulocytes: 0.08 10*3/uL — ABNORMAL HIGH (ref 0.00–0.07)
Basophils Absolute: 0 10*3/uL (ref 0.0–0.1)
Basophils Relative: 1 %
Eosinophils Absolute: 0.1 10*3/uL (ref 0.0–0.5)
Eosinophils Relative: 1 %
HCT: 28.4 % — ABNORMAL LOW (ref 39.0–52.0)
Hemoglobin: 10.5 g/dL — ABNORMAL LOW (ref 13.0–17.0)
Immature Granulocytes: 1 %
Lymphocytes Relative: 24 %
Lymphs Abs: 1.5 10*3/uL (ref 0.7–4.0)
MCH: 38.9 pg — ABNORMAL HIGH (ref 26.0–34.0)
MCHC: 37 g/dL — ABNORMAL HIGH (ref 30.0–36.0)
MCV: 105.2 fL — ABNORMAL HIGH (ref 80.0–100.0)
Monocytes Absolute: 0.6 10*3/uL (ref 0.1–1.0)
Monocytes Relative: 10 %
Neutro Abs: 4 10*3/uL (ref 1.7–7.7)
Neutrophils Relative %: 63 %
Platelets: 297 10*3/uL (ref 150–400)
RBC: 2.7 MIL/uL — ABNORMAL LOW (ref 4.22–5.81)
RDW: 16.5 % — ABNORMAL HIGH (ref 11.5–15.5)
WBC: 6.3 10*3/uL (ref 4.0–10.5)
nRBC: 0 % (ref 0.0–0.2)

## 2019-08-27 LAB — COMPREHENSIVE METABOLIC PANEL
ALT: 13 U/L (ref 0–44)
AST: 26 U/L (ref 15–41)
Albumin: 4.3 g/dL (ref 3.5–5.0)
Alkaline Phosphatase: 44 U/L (ref 38–126)
Anion gap: 9 (ref 5–15)
BUN: 23 mg/dL (ref 8–23)
CO2: 24 mmol/L (ref 22–32)
Calcium: 9.2 mg/dL (ref 8.9–10.3)
Chloride: 107 mmol/L (ref 98–111)
Creatinine, Ser: 1.36 mg/dL — ABNORMAL HIGH (ref 0.61–1.24)
GFR calc Af Amer: 54 mL/min — ABNORMAL LOW (ref >60)
GFR calc non Af Amer: 46 mL/min — ABNORMAL LOW (ref >60)
Glucose, Bld: 128 mg/dL — ABNORMAL HIGH (ref 70–99)
Potassium: 3.6 mmol/L (ref 3.5–5.1)
Sodium: 140 mmol/L (ref 135–145)
Total Bilirubin: 1.2 mg/dL (ref 0.3–1.2)
Total Protein: 7.4 g/dL (ref 6.5–8.1)

## 2019-08-27 LAB — RETICULOCYTES
Immature Retic Fract: 25 % — ABNORMAL HIGH (ref 2.3–15.9)
RBC.: 3.18 MIL/uL — ABNORMAL LOW (ref 4.22–5.81)
Retic Count, Absolute: 92.5 10*3/uL (ref 19.0–186.0)
Retic Ct Pct: 2.9 % (ref 0.4–3.1)

## 2019-08-27 NOTE — Progress Notes (Signed)
Pt has no complaints today.  

## 2019-08-28 LAB — HAPTOGLOBIN: Haptoglobin: <10 mg/dL — ABNORMAL LOW (ref 38–329)

## 2019-08-30 ENCOUNTER — Encounter: Payer: Self-pay | Admitting: Oncology

## 2019-08-30 NOTE — Progress Notes (Signed)
Hematology/Oncology Consult note [facility]  Telephone:([phone] [phone]  Patient Care Team: [name], MD as PCP - General (Family Medicine) [name], MD as PCP - Cardiology (Cardiology) Bary Castilla Forest Gleason, MD as Consulting Physician (General Surgery) [name], MD as Referring Physician (Internal Medicine) [name], MD as Consulting Physician (Cardiology)   Name of the patient: William Taylor  [id number]  Oct 06, 1929   Date of visit: 08/30/19  Diagnosis- cold agglutinin hemolytic anemia  Chief complaint/ Reason for visit-routine follow-up of cold agglutinin hemolytic anemia  Heme/Onc history: patient is a 86 year old male with a past medical history significant for prostate cancer for which he follows up with urology. He also has chronic back pain,hypertension and stage III CKD hyperlipidemia and A. fib among other medical problems. He has been referred to [place] for evaluation and management of anemia. Recent CBC from 02/16/2018 showed white count of 6.1, H&H of 10.7/30.8 with an MCV of 99.9. He was found to have coldagglutinin inhis peripheral blood. TSH was within normal limits.B12 and folate were within normal limits.His hemoglobin was 10.7 about 4 months ago but about 1 year ago his hemoglobin was 12.9.  Patient reports ongoing fatigue over the last 1 year. His appetite is good and his weight has been stable. He denies any lumps or bumps anywhere or drenching night sweats. Patient does report ongoing left-sided groin pain. He did have an ultrasound which did not reveal any adenopathy or mass. Also reports chronic back pain but denies any other acute joint pain or joint swelling. No skin rash.  Results of blood work from 03/05/2018 were as follows: CBC showed white count of 6.5, H&H of 11.9/35 with an MCV of 101 and a platelet count of 228. CMP was normal except for a mildly elevated creatinine of  1.3. LDH was normal at 122 reticulocyte count was low normal at 1.9. Multiple myeloma panel did not reveal any M protein serum immunofixation was normal. Free light chain showed mildly elevated kappa of 23.8 with a normal kappa lambda light chain ratio of 1.5. Haptoglobin was low at 21 cold agglutinin titer was positive at 1 is to 1024 Coombs test was positive for complement and negative for IgG smear review showed macrocytic anemia with rouleaux formation. Negative for schistocytes iron studies were within normal limits  CT chest abdomen and pelvis in November 2019 showed nonspecific 4 mm left lower lobe lung nodule. Prostatomegaly. No evidence of malignancy   Interval history-patient has not had any further episodes of orthostatic hypotension.  In fact his blood pressure has been trending high and his midodrine dose was recently cut down.  He has chronic fatigue but denies other complaints at this time  ECOG PS- 2 Pain scale- 0   Review of systems- Review of Systems  Constitutional: Positive for malaise/fatigue. Negative for chills, fever and weight loss.  HENT: Negative for congestion, ear discharge and nosebleeds.   Eyes: Negative for blurred vision.  Respiratory: Negative for cough, hemoptysis, sputum production, shortness of breath and wheezing.   Cardiovascular: Negative for chest pain, palpitations, orthopnea and claudication.  Gastrointestinal: Negative for abdominal pain, blood in stool, constipation, diarrhea, heartburn, melena, nausea and vomiting.  Genitourinary: Negative for dysuria, flank pain, frequency, hematuria and urgency.  Musculoskeletal: Negative for back pain, joint pain and myalgias.  Skin: Negative for rash.  Neurological: Negative for dizziness, tingling, focal weakness, seizures, weakness and headaches.  Endo/Heme/Allergies: Does not bruise/bleed easily.  Psychiatric/Behavioral: Negative  for depression and suicidal ideas. The patient does not have insomnia.        Allergies  Allergen Reactions  . Bee Venom Swelling  . Sulfasalazine Hives and Swelling  . Sulfa Antibiotics Hives and Swelling  . Warfarin And Related Other (See Comments)    Chest pain     Past Medical History:  Diagnosis Date  . Cancer [facility])    Prostate, followed by Dr. Jacqlyn Larsen  . Colon polyp   . Coronary artery disease   . Gall stones   . Hyperlipidemia   . Hypertension   . MI (myocardial infarction) ([place])    2015  . PAF (paroxysmal atrial fibrillation) ([place])    a. on xarelto  . Skin cancer      Past Surgical History:  Procedure Laterality Date  . CARDIAC CATHETERIZATION  10/2013   [facility]  . CATARACT EXTRACTION  Oct. 3, 2012   right eye  . colonoscopy    . COLONOSCOPY W/ POLYPECTOMY  2015   Dr Rayann Heman  . COLONOSCOPY WITH PROPOFOL N/A 08/15/2016   Procedure: COLONOSCOPY WITH PROPOFOL;  Surgeon: [name], MD;  Location: [facility] ENDOSCOPY;  Service: Endoscopy;  Laterality: N/A;  . CORONARY ANGIOPLASTY  2009   2005; s/p stent  . ENTEROSCOPY N/A 09/22/2018   Procedure: ENTEROSCOPY;  Surgeon: Lin Landsman, MD;  Location: [place];  Service: Gastroenterology;  Laterality: N/A;  . ESOPHAGOGASTRODUODENOSCOPY (EGD) WITH PROPOFOL N/A 08/15/2016   Procedure: ESOPHAGOGASTRODUODENOSCOPY (EGD) WITH PROPOFOL;  Surgeon: [name], MD;  Location: [facility] ENDOSCOPY;  Service: Endoscopy;  Laterality: N/A;  . GIVENS CAPSULE STUDY N/A 09/26/2016   Procedure: GIVENS CAPSULE STUDY;  Surgeon: [name], MD;  Location: [facility] ENDOSCOPY;  Service: Endoscopy;  Laterality: N/A;  . HERNIA REPAIR  2013  . LUMBAR SPINE SURGERY    . UPPER GI ENDOSCOPY  Sept 2015   Dr Rayann Heman    Social History   Socioeconomic History  . Marital status: Married    Spouse name: Not on file  . Number of children: Not on file  . Years of education: Not on file  . Highest education level: Not on file  Occupational History  . Occupation: retired   Tobacco Use  . Smoking status: Never Smoker  .  Smokeless tobacco: Never Used  Substance and Sexual Activity  . Alcohol use: No  . Drug use: No  . Sexual activity: Not Currently  Other Topics Concern  . Not on file  Social History Narrative  . Not on file   Social Determinants of Health   Financial Resource Strain: Low Risk   . Difficulty of Paying Living Expenses: Not hard at all  Food Insecurity: No Food Insecurity  . Worried About Charity fundraiser in the Last Year: Never true  . Ran Out of Food in the Last Year: Never true  Transportation Needs: No Transportation Needs  . Lack of Transportation (Medical): No  . Lack of Transportation (Non-Medical): No  Physical Activity:   . Days of Exercise per Week: Not on file  . Minutes of Exercise per Session: Not on file  Stress:   . Feeling of Stress : Not on file  Social Connections:   . Frequency of Communication with Friends and Family: Not on file  . Frequency of Social Gatherings with Friends and Family: Not on file  . Attends Religious Services: Not on file  . Active Member of Clubs or Organizations: Not on file  . Attends Archivist Meetings: Not  on file  . Marital Status: Not on file  Intimate Partner Violence:   . Fear of Current or Ex-Partner: Not on file  . Emotionally Abused: Not on file  . Physically Abused: Not on file  . Sexually Abused: Not on file    Family History  Problem Relation Age of Onset  . Stroke Mother   . Colon cancer Father   . Coronary artery disease Brother   . Other Brother 37       lymes dz     Current Outpatient Medications:  .  cyproheptadine (PERIACTIN) 4 MG tablet, Take 4 mg by mouth 3 (three) times daily as needed., Disp: , Rfl:  .  Droxidopa (NORTHERA) 100 MG CAPS, Take 200 mg by mouth 3 (three) times daily., Disp: , Rfl:  .  fenofibrate micronized (ANTARA) 130 MG capsule, TAKE 1 CAPSULE BY MOUTH  DAILY BEFORE BREAKFAST, Disp: 90 capsule, Rfl: 3 .  finasteride (PROSCAR) 5 MG tablet, Take 1 tablet (5 mg total) by  mouth daily., Disp: 90 tablet, Rfl: 3 .  fludrocortisone (FLORINEF) 0.1 MG tablet, daily. , Disp: , Rfl:  .  levobunolol (BETAGAN) 0.5 % ophthalmic solution, Place 1 drop into the right eye 2 (two) times daily. , Disp: , Rfl:  .  levothyroxine (SYNTHROID) 50 MCG tablet, Take 50 mcg by mouth daily before breakfast., Disp: , Rfl:  .  Melatonin 3 MG CAPS, Take 2 capsules by mouth every evening., Disp: , Rfl:  .  midodrine (PROAMATINE) 2.5 MG tablet, Take 1-2 tablets (2.5-5 mg total) by mouth 3 (three) times daily with meals., Disp: 270 tablet, Rfl: 3 .  mirtazapine (REMERON) 15 MG tablet, TAKE 1 TABLET BY MOUTH NIGHTLY, Disp: 30 tablet, Rfl: 3 .  Multiple Vitamin (MULTIVITAMIN PO), Take 1 tablet by mouth daily., Disp: , Rfl:  .  nitroGLYCERIN (NITROSTAT) 0.4 MG SL tablet, DISSOLVE UNDER THE TONGUE 1 TABLET EVERY 5 MINUTES AS NEEDED FOR CHEST PAIN, Disp: 25 tablet, Rfl: 0 .  pantoprazole (PROTONIX) 40 MG tablet, TAKE ONE TABLET BY MOUTH TWICE DAILY, Disp: 60 tablet, Rfl: 5 .  polyethylene glycol (MIRALAX / GLYCOLAX) 17 g packet, Take 17 g by mouth daily as needed., Disp: , Rfl:  .  pravastatin (PRAVACHOL) 40 MG tablet, TAKE 1 TABLET BY MOUTH AT  BEDTIME, Disp: 90 tablet, Rfl: 3 .  rivaroxaban (XARELTO) 20 MG TABS tablet, Take 1 tablet (20 mg total) by mouth daily., Disp: 90 tablet, Rfl: 1 .  thiamine (VITAMIN B-1) 100 MG tablet, Take 100 mg by mouth daily., Disp: , Rfl:  .  amiodarone (PACERONE) 100 MG tablet, Take 1 tablet (100 mg total) by mouth daily for 30 days. (Patient taking differently: Take 50 mg by mouth daily. ), Disp: 30 tablet, Rfl: 0  Physical exam:  Vitals:   08/27/19 1135  BP: (!) 164/116  Pulse: 86  Resp: 16  Temp: (!) 96 F (35.6 C)  TempSrc: Tympanic  Weight: 211 lb 8 oz (95.9 kg)   Physical Exam Constitutional:      Comments: Frail elderly gentleman who ambulates with a cane.  Does not appear to be in any acute distress  HENT:     Head: Normocephalic and atraumatic.    Eyes:     Pupils: Pupils are equal, round, and reactive to light.  Cardiovascular:     Rate and Rhythm: Normal rate and regular rhythm.     Heart sounds: Normal heart sounds.  Pulmonary:     Effort: Pulmonary  effort is normal.     Breath sounds: Normal breath sounds.  Abdominal:     General: Bowel sounds are normal.     Palpations: Abdomen is soft.  Musculoskeletal:     Cervical back: Normal range of motion.  Skin:    General: Skin is warm and dry.  Neurological:     Mental Status: He is alert and oriented to person, place, and time.      CMP Latest Ref Rng & Units 08/27/2019  Glucose 70 - 99 mg/dL 128(H)  BUN 8 - 23 mg/dL 23  Creatinine 0.61 - 1.24 mg/dL 1.36(H)  Sodium 135 - 145 mmol/L 140  Potassium 3.5 - 5.1 mmol/L 3.6  Chloride 98 - 111 mmol/L 107  CO2 22 - 32 mmol/L 24  Calcium 8.9 - 10.3 mg/dL 9.2  Total Protein 6.5 - 8.1 g/dL 7.4  Total Bilirubin 0.3 - 1.2 mg/dL 1.2  Alkaline Phos 38 - 126 U/L 44  AST 15 - 41 U/L 26  ALT 0 - 44 U/L 13   CBC Latest Ref Rng & Units 08/27/2019  WBC 4.0 - 10.5 K/uL 6.3  Hemoglobin 13.0 - 17.0 g/dL 10.5(L)  Hematocrit 39.0 - 52.0 % 28.4(L)  Platelets 150 - 400 K/uL 297    No images are attached to the encounter.  No results found.   Assessment and plan- Patient is a 86 y.o. male with cold agglutinin to moderate peak anemia here for routine follow-up  Patient's hemoglobin has remained stable around 10 since 2019.  He does have evidence of cold agglutinin hemolytic anemia and continues to have some degree of hemolysis.  His labs were reviewed by me and his haptoglobin is still less than 10.  Reticulocyte count is however 2.9 which is more normal for the degree of anemia.  Also his total bilirubin at this time is normal.  He does not require any treatment for this.  Given his age and frailty it would be okay for him to follow-up with Dr. Caryl Bis and he can be referred to [place] in the future if his anemia worsens   Visit Diagnosis 1.  Cold agglutinin disease   2. Macrocytic anemia      Dr. Randa Evens, MD, MPH [facility] [phone] 08/30/2019 3:07 PM

## 2019-08-31 ENCOUNTER — Ambulatory Visit (INDEPENDENT_AMBULATORY_CARE_PROVIDER_SITE_OTHER): Payer: Medicare HMO | Admitting: Family Medicine

## 2019-08-31 ENCOUNTER — Encounter: Payer: Self-pay | Admitting: Family Medicine

## 2019-08-31 ENCOUNTER — Telehealth: Payer: Self-pay | Admitting: Family Medicine

## 2019-08-31 ENCOUNTER — Other Ambulatory Visit: Payer: Self-pay

## 2019-08-31 DIAGNOSIS — I480 Paroxysmal atrial fibrillation: Secondary | ICD-10-CM | POA: Diagnosis not present

## 2019-08-31 DIAGNOSIS — Z8719 Personal history of other diseases of the digestive system: Secondary | ICD-10-CM | POA: Diagnosis not present

## 2019-08-31 DIAGNOSIS — M170 Bilateral primary osteoarthritis of knee: Secondary | ICD-10-CM

## 2019-08-31 DIAGNOSIS — F329 Major depressive disorder, single episode, unspecified: Secondary | ICD-10-CM

## 2019-08-31 DIAGNOSIS — D5912 Cold autoimmune hemolytic anemia: Secondary | ICD-10-CM

## 2019-08-31 DIAGNOSIS — I951 Orthostatic hypotension: Secondary | ICD-10-CM

## 2019-08-31 DIAGNOSIS — R69 Illness, unspecified: Secondary | ICD-10-CM | POA: Diagnosis not present

## 2019-08-31 DIAGNOSIS — F32A Depression, unspecified: Secondary | ICD-10-CM

## 2019-08-31 NOTE — Assessment & Plan Note (Signed)
Released by oncology to have lab work with [place].  Plan to recheck in 6 months.

## 2019-08-31 NOTE — Telephone Encounter (Signed)
LMTCB to set up 2m appt

## 2019-08-31 NOTE — Assessment & Plan Note (Signed)
Reports no bleeding on Xarelto.  Continue Xarelto at this time.

## 2019-08-31 NOTE — Assessment & Plan Note (Signed)
Asymptomatic.  Continue Remeron.

## 2019-08-31 NOTE — Assessment & Plan Note (Signed)
Reports no issues with recurrent bleeding.  He will continue on Xarelto.  He will periodically have a CBC through our office.

## 2019-08-31 NOTE — Progress Notes (Signed)
Virtual Visit via telephone Note  This visit type was conducted due to national recommendations for restrictions regarding the COVID-19 pandemic (e.g. social distancing).  This format is felt to be most appropriate for this patient at this time.  All issues noted in this document were discussed and addressed.  No physical exam was performed (except for noted visual exam findings with Video Visits).   I connected with William Taylor today at 12:00 PM EST by telephone and verified that I am speaking with the correct person using two identifiers. Location patient: home Location provider: work Persons participating in the virtual visit: patient, provider, Donaven Spikes (wife)  I discussed the limitations, risks, security and privacy concerns of performing an evaluation and management service by telephone and the availability of in person appointments. I also discussed with the patient that there may be a patient responsible charge related to this service. The patient expressed understanding and agreed to proceed.  Interactive audio and video telecommunications were attempted between this provider and patient, however failed, due to patient having technical difficulties OR patient did not have access to video capability.  We continued and completed visit with audio only.   Reason for visit: Follow-up.  HPI: Orthostasis: Patient notes his lightheadedness is improved quite a bit.  He sits on the edge of the bed at night and that does help.  He is on Northera and midodrine.  His blood pressure has been running up to 160/116 though is oftentimes a little lower than that.  Cardiology has been decreasing his midodrine dose progressively.  Rare chest discomfort though he had a low risk stress test recently and recently followed up with cardiology.  Depression: No depressive symptoms.  He is on Remeron.  History of GI bleed: No melena or blood in his stool.  A. fib: Continues on Xarelto and follows  with cardiology.  Osteoarthritis bilateral knees: Patient notes bilateral knee discomfort that typically only bothers him when he goes to get up.  They are achy and hurt.  He walks with a cane and does walk with a grocery cart when he is in the store.  He does have a history of injury back in 1970 where his knees tucked underneath him.  He notes no popping or locking.  Topical blue emu does help with his symptoms.   ROS: See pertinent positives and negatives per HPI.  Past Medical History:  Diagnosis Date  . Cancer [facility])    Prostate, followed by Dr. Jacqlyn Larsen  . Colon polyp   . Coronary artery disease   . Gall stones   . Hyperlipidemia   . Hypertension   . MI (myocardial infarction) ([place])    2015  . PAF (paroxysmal atrial fibrillation) ([place])    a. on xarelto  . Skin cancer     Past Surgical History:  Procedure Laterality Date  . CARDIAC CAT[facility]IZATION  [facility]   [facility]  . CATARACT EXTRACTION  Oct. 3, 2012   right eye  . colonoscopy    . COLONOSCOPY W/ POLYPECTOMY  2015   Dr Rayann Heman  . COLONOSCOPY WITH PROPOFOL N/A 08/15/2016   Procedure: COLONOSCOPY WITH PROPOFOL;  Surgeon: [name], MD;  Location: [facility] ENDOSCOPY;  Service: Endoscopy;  Laterality: N/A;  . CORONARY ANGIOPLASTY  2009   2005; s/p stent  . ENTEROSCOPY N/A 09/22/2018   Procedure: ENTEROSCOPY;  Surgeon: Lin Landsman, MD;  Location: [place];  Service: Gastroenterology;  Laterality: N/A;  . ESOPHAGOGASTRODUODENOSCOPY (EGD) WITH PROPOFOL N/A 08/15/2016  Procedure: ESOPHAGOGASTRODUODENOSCOPY (EGD) WITH PROPOFOL;  Surgeon: [name], MD;  Location: [facility] ENDOSCOPY;  Service: Endoscopy;  Laterality: N/A;  . GIVENS CAPSULE STUDY N/A 09/26/2016   Procedure: GIVENS CAPSULE STUDY;  Surgeon: [name], MD;  Location: [facility] ENDOSCOPY;  Service: Endoscopy;  Laterality: N/A;  . HERNIA REPAIR  2013  . LUMBAR SPINE SURGERY    . UPPER GI ENDOSCOPY  Sept 2015   Dr Rayann Heman    Family History  Problem Relation Age of Onset  .  Stroke Mother   . Colon cancer Father   . Coronary artery disease Brother   . Other Brother 22       lymes dz    SOCIAL HX: Non-smoker.   Current Outpatient Medications:  .  amiodarone (PACERONE) 200 MG tablet, Take 200 mg by mouth daily., Disp: , Rfl:  .  cyproheptadine (PERIACTIN) 4 MG tablet, Take 4 mg by mouth 3 (three) times daily as needed., Disp: , Rfl:  .  Droxidopa (NORTHERA) 100 MG CAPS, Take 200 mg by mouth 3 (three) times daily., Disp: , Rfl:  .  fenofibrate micronized (ANTARA) 130 MG capsule, TAKE 1 CAPSULE BY MOUTH  DAILY BEFORE BREAKFAST, Disp: 90 capsule, Rfl: 3 .  finasteride (PROSCAR) 5 MG tablet, Take 1 tablet (5 mg total) by mouth daily., Disp: 90 tablet, Rfl: 3 .  fludrocortisone (FLORINEF) 0.1 MG tablet, daily. , Disp: , Rfl:  .  levobunolol (BETAGAN) 0.5 % ophthalmic solution, Place 1 drop into the right eye 2 (two) times daily. , Disp: , Rfl:  .  levothyroxine (SYNTHROID) 50 MCG tablet, Take 50 mcg by mouth daily before breakfast., Disp: , Rfl:  .  Melatonin 3 MG CAPS, Take 2 capsules by mouth every evening., Disp: , Rfl:  .  midodrine (PROAMATINE) 2.5 MG tablet, Take 1-2 tablets (2.5-5 mg total) by mouth 3 (three) times daily with meals., Disp: 270 tablet, Rfl: 3 .  mirtazapine (REMERON) 15 MG tablet, TAKE 1 TABLET BY MOUTH NIGHTLY, Disp: 30 tablet, Rfl: 3 .  Multiple Vitamin (MULTIVITAMIN PO), Take 1 tablet by mouth daily., Disp: , Rfl:  .  nitroGLYCERIN (NITROSTAT) 0.4 MG SL tablet, DISSOLVE UNDER THE TONGUE 1 TABLET EVERY 5 MINUTES AS NEEDED FOR CHEST PAIN, Disp: 25 tablet, Rfl: 0 .  pantoprazole (PROTONIX) 40 MG tablet, TAKE ONE TABLET BY MOUTH TWICE DAILY, Disp: 60 tablet, Rfl: 5 .  polyethylene glycol (MIRALAX / GLYCOLAX) 17 g packet, Take 17 g by mouth daily as needed., Disp: , Rfl:  .  pravastatin (PRAVACHOL) 40 MG tablet, TAKE 1 TABLET BY MOUTH AT  BEDTIME, Disp: 90 tablet, Rfl: 3 .  rivaroxaban (XARELTO) 20 MG TABS tablet, Take 1 tablet (20 mg total) by  mouth daily., Disp: 90 tablet, Rfl: 1 .  thiamine (VITAMIN B-1) 100 MG tablet, Take 100 mg by mouth daily., Disp: , Rfl:  .  amiodarone (PACERONE) 100 MG tablet, Take 1 tablet (100 mg total) by mouth daily for 30 days. (Patient taking differently: Take 50 mg by mouth daily. ), Disp: 30 tablet, Rfl: 0  EXAM: This was a telehealth telephone visit and thus no physical exam was completed.  ASSESSMENT AND PLAN:  Discussed the following assessment and plan:  Orthostatic hypotension Symptomatically improved at this time.  His BP has been running high.  Cardiology is aware of this though I will send them a message letting them know where he stands on his current dose of midodrine.  Paroxysmal A-fib (HCC) Reports no bleeding on  Xarelto.  Continue Xarelto at this time.  History of GI bleed Reports no issues with recurrent bleeding.  He will continue on Xarelto.  He will periodically have a CBC through our office.  Cold agglutinin disease Released by oncology to have lab work with [place].  Plan to recheck in 6 months.  Depression Asymptomatic.  Continue Remeron.  Osteoarthritis of knees, bilateral Occasional symptoms.  He can continue topical blue emu.  Discussed if he needed anything orally should try Tylenol.     No orders of the defined types were placed in this encounter.   No orders of the defined types were placed in this encounter.    I discussed the assessment and treatment plan with the patient. The patient was provided an opportunity to ask questions and all were answered. The patient agreed with the plan and demonstrated an understanding of the instructions.   The patient was advised to call back or seek an in-person evaluation if the symptoms worsen or if the condition fails to improve as anticipated.  I provided 13 minutes of non-face-to-face time during this encounter.   Tommi Rumps, MD

## 2019-08-31 NOTE — Assessment & Plan Note (Signed)
Symptomatically improved at this time.  His BP has been running high.  Cardiology is aware of this though I will send them a message letting them know where he stands on his current dose of midodrine.

## 2019-08-31 NOTE — Assessment & Plan Note (Signed)
Occasional symptoms.  He can continue topical blue emu.  Discussed if he needed anything orally should try Tylenol.

## 2019-09-08 ENCOUNTER — Other Ambulatory Visit: Payer: Self-pay

## 2019-09-08 ENCOUNTER — Ambulatory Visit (INDEPENDENT_AMBULATORY_CARE_PROVIDER_SITE_OTHER): Payer: Medicare HMO

## 2019-09-08 VITALS — BP 158/97 | HR 92 | Ht 76.0 in | Wt 210.0 lb

## 2019-09-08 DIAGNOSIS — Z0000 Encounter for general adult medical examination without abnormal findings: Secondary | ICD-10-CM

## 2019-09-08 NOTE — Progress Notes (Signed)
Subjective:   William Taylor is a 86 y.o. male who presents for Medicare Annual/Subsequent preventive examination.  Review of Systems:  No ROS.  Medicare Wellness Virtual Visit.  Visual/audio telehealth visit.  Blood pressure taken during visit by patient's wife.  Ht/Wt provided.  See social history for additional risk factors.  Cardiac Risk Factors include: advanced age (>17men, >68 women);male gender;hypertension     Objective:    Vitals: BP (!) 158/97 (BP Location: Left Arm, Patient Position: Sitting, Cuff Size: Normal)   Pulse 92   Ht 6\' 4"  (1.93 m)   Wt 210 lb (95.3 kg)   BMI 25.56 kg/m   Body mass index is 25.56 kg/m.  Advanced Directives 09/08/2019 08/27/2019 08/26/2019 06/17/2019 04/27/2019 01/13/2019 12/24/2018  Does Patient Have a Medical Advance Directive? - No No No Yes Yes -  Type of Advance Directive - - - - - - Press photographer;Living will  Does patient want to make changes to medical advance directive? No - Patient declined No - Patient declined - - - No - Patient declined -  Copy of [address] in Chart? - - - - - - No - copy requested  Would patient like information on creating a medical advance directive? No - Patient declined No - Patient declined - - - - -    Tobacco Social History   Tobacco Use  Smoking Status Never Smoker  Smokeless Tobacco Never Used     Counseling given: Not Answered   Clinical Intake:  Pre-visit preparation completed: Yes        Diabetes: No  How often do you need to have someone help you when you read instructions, pamphlets, or other written materials from your doctor or pharmacy?: 1 - Never  Interpreter Needed?: No     Past Medical History:  Diagnosis Date  . Cancer [facility])    Prostate, followed by Dr. Jacqlyn Larsen  . Colon polyp   . Coronary artery disease   . Gall stones   . Hyperlipidemia   . Hypertension   . MI (myocardial infarction) ([place])    2015  . PAF (paroxysmal atrial  fibrillation) ([place])    a. on xarelto  . Skin cancer    Past Surgical History:  Procedure Laterality Date  . CARDIAC CATHETERIZATION  10/2013   [facility]  . CATARACT EXTRACTION  Oct. 3, 2012   right eye  . colonoscopy    . COLONOSCOPY W/ POLYPECTOMY  2015   Dr Rayann Heman  . COLONOSCOPY WITH PROPOFOL N/A 08/15/2016   Procedure: COLONOSCOPY WITH PROPOFOL;  Surgeon: [name], MD;  Location: [facility] ENDOSCOPY;  Service: Endoscopy;  Laterality: N/A;  . CORONARY ANGIOPLASTY  2009   2005; s/p stent  . ENTEROSCOPY N/A 09/22/2018   Procedure: ENTEROSCOPY;  Surgeon: Lin Landsman, MD;  Location: [place];  Service: Gastroenterology;  Laterality: N/A;  . ESOPHAGOGASTRODUODENOSCOPY (EGD) WITH PROPOFOL N/A 08/15/2016   Procedure: ESOPHAGOGASTRODUODENOSCOPY (EGD) WITH PROPOFOL;  Surgeon: [name], MD;  Location: [facility] ENDOSCOPY;  Service: Endoscopy;  Laterality: N/A;  . GIVENS CAPSULE STUDY N/A 09/26/2016   Procedure: GIVENS CAPSULE STUDY;  Surgeon: [name], MD;  Location: [facility] ENDOSCOPY;  Service: Endoscopy;  Laterality: N/A;  . HERNIA REPAIR  2013  . LUMBAR SPINE SURGERY    . UPPER GI ENDOSCOPY  Sept 2015   Dr Rayann Heman   Family History  Problem Relation Age of Onset  . Stroke Mother   . Colon cancer Father   . Coronary  artery disease Brother   . Other Brother 52       lymes dz   Social History   Socioeconomic History  . Marital status: Married    Spouse name: Not on file  . Number of children: Not on file  . Years of education: Not on file  . Highest education level: Not on file  Occupational History  . Occupation: retired   Tobacco Use  . Smoking status: Never Smoker  . Smokeless tobacco: Never Used  Substance and Sexual Activity  . Alcohol use: No  . Drug use: No  . Sexual activity: Not Currently  Other Topics Concern  . Not on file  Social History Narrative  . Not on file   Social Determinants of Health   Financial Resource Strain: Low Risk   . Difficulty of Paying  Living Expenses: Not hard at all  Food Insecurity: No Food Insecurity  . Worried About Charity fundraiser in the Last Year: Never true  . Ran Out of Food in the Last Year: Never true  Transportation Needs: No Transportation Needs  . Lack of Transportation (Medical): No  . Lack of Transportation (Non-Medical): No  Physical Activity:   . Days of Exercise per Week: Not on file  . Minutes of Exercise per Session: Not on file  Stress: No Stress Concern Present  . Feeling of Stress : Not at all  Social Connections: Unknown  . Frequency of Communication with Friends and Family: More than three times a week  . Frequency of Social Gatherings with Friends and Family: Once a week  . Attends Religious Services: Not on file  . Active Member of Clubs or Organizations: Yes  . Attends Archivist Meetings: Not on file  . Marital Status: Married    Outpatient Encounter Medications as of 09/08/2019  Medication Sig  . amiodarone (PACERONE) 100 MG tablet Take 1 tablet (100 mg total) by mouth daily for 30 days. (Patient taking differently: Take 50 mg by mouth daily. )  . amiodarone (PACERONE) 200 MG tablet Take 200 mg by mouth daily.  . cyproheptadine (PERIACTIN) 4 MG tablet Take 4 mg by mouth 3 (three) times daily as needed.  . Droxidopa (NORTHERA) 100 MG CAPS Take 200 mg by mouth 3 (three) times daily.  . fenofibrate micronized (ANTARA) 130 MG capsule TAKE 1 CAPSULE BY MOUTH  DAILY BEFORE BREAKFAST  . finasteride (PROSCAR) 5 MG tablet Take 1 tablet (5 mg total) by mouth daily.  . fludrocortisone (FLORINEF) 0.1 MG tablet daily.   Marland Kitchen levobunolol (BETAGAN) 0.5 % ophthalmic solution Place 1 drop into the right eye 2 (two) times daily.   Marland Kitchen levothyroxine (SYNTHROID) 50 MCG tablet Take 50 mcg by mouth daily before breakfast.  . Melatonin 3 MG CAPS Take 2 capsules by mouth every evening.  . midodrine (PROAMATINE) 2.5 MG tablet Take 1-2 tablets (2.5-5 mg total) by mouth 3 (three) times daily with meals.   . mirtazapine (REMERON) 15 MG tablet TAKE 1 TABLET BY MOUTH NIGHTLY  . Multiple Vitamin (MULTIVITAMIN PO) Take 1 tablet by mouth daily.  . nitroGLYCERIN (NITROSTAT) 0.4 MG SL tablet DISSOLVE UNDER THE TONGUE 1 TABLET EVERY 5 MINUTES AS NEEDED FOR CHEST PAIN  . pantoprazole (PROTONIX) 40 MG tablet TAKE ONE TABLET BY MOUTH TWICE DAILY  . polyethylene glycol (MIRALAX / GLYCOLAX) 17 g packet Take 17 g by mouth daily as needed.  . pravastatin (PRAVACHOL) 40 MG tablet TAKE 1 TABLET BY MOUTH AT  BEDTIME  .  rivaroxaban (XARELTO) 20 MG TABS tablet Take 1 tablet (20 mg total) by mouth daily.  Marland Kitchen thiamine (VITAMIN B-1) 100 MG tablet Take 100 mg by mouth daily.   No facility-administered encounter medications on file as of 09/08/2019.    Activities of Daily Living In your present state of health, do you have any difficulty performing the following activities: 09/08/2019 01/06/2019  Hearing? Y Y  Comment Hearing aid wife has taken hearing aide in for repair   Vision? N N  Difficulty concentrating or making decisions? N N  Walking or climbing stairs? Y Y  Comment Unsteady gait; cane in use as needed. using walker   Dressing or bathing? N Y  Comment - wife helps   Doing errands, shopping? N Y  Comment - wife helps   Conservation officer, nature and eating ? N -  Using the Toilet? N Y  Comment - wife assist   In the past six months, have you accidently leaked urine? N N  Do you have problems with loss of bowel control? N N  Managing your Medications? N Y  Comment - wife assist   Managing your Finances? N Y  Comment - wife assist   Housekeeping or managing your Housekeeping? N Y  Comment - wife handles   Some recent data might be hidden    Patient Care Team: [name], MD as PCP - General (Family Medicine) Rockey Situ Kathlene November, MD as PCP - Cardiology (Cardiology) Bary Castilla, Forest Gleason, MD as Consulting Physician (General Surgery) [name], MD as Referring Physician (Internal Medicine) [name], MD as Consulting Physician (Cardiology)   Assessment:   This is a routine wellness examination for [place].  Nurse connected with patient 09/08/19 at  2:00 PM EST by a telephone enabled telemedicine application and verified that I am speaking with the correct person using two identifiers. Patient stated full name and DOB. Patient gave permission to continue with virtual visit. Patient's location was at home and Nurse's location was at [place] office.   Patient is alert and oriented x3. Patient denies difficulty focusing or concentrating.  Patient likes to read and complete crossword puzzles for brain stimulation.   Health Maintenance Due: See completed HM at the end of note.   Eye: Visual acuity not assessed. Virtual visit. Followed by their ophthalmologist.  Dental: UTD  Hearing: Hearing aids- yes  Safety:  Patient feels safe at home- yes Patient does have smoke detectors at home- yes Patient does wear sunscreen or protective clothing when in direct sunlight - yes Patient does wear seat belt when in a moving vehicle - yes Patient drives- yes Adequate lighting in walkways free from debris- yes Grab bars and handrails used as appropriate- yes Ambulates with an assistive device- yes, cane as needed  Social: Alcohol intake - no      Smoking history- never   Smokers in home? none Illicit drug use? none  Medication: Taking as directed and without issues.  Pill box in use -yes  Self managed - yes   Covid-19: Precautions and sickness symptoms discussed. Wears mask, social distancing, hand hygiene as appropriate.   Activities of Daily Living Patient denies needing assistance with: household chores, feeding themselves, getting from bed to chair, getting to the toilet, bathing/showering, dressing, managing money, or preparing meals.   Discussed the importance of a healthy diet, water intake and the benefits of aerobic exercise.   Physical activity- walking and  active around the home, no routine. Encouraged to stay  active.   Diet:  Regular Water: fair intake; encouraged to stay hydrated Caffeine: 1 cup of tea in the morning  Other Providers Patient Care Team: [name], MD as PCP - General (Family Medicine) Rockey Situ, Kathlene November, MD as PCP - Cardiology (Cardiology) Bary Castilla, Forest Gleason, MD as Consulting Physician (General Surgery) [name], MD as Referring Physician (Internal Medicine) [name], MD as Consulting Physician (Cardiology)   Exercise Activities and Dietary recommendations Current Exercise Habits: Home exercise routine, Type of exercise: walking, Intensity: Mild  Goals      Patient Stated   . DIET - INCREASE WATER INTAKE (pt-stated)       Fall Risk Fall Risk  09/08/2019 01/06/2019 06/30/2018 06/26/2018 07/16/2017  Falls in the past year? 0 1 0 0 No  Number falls in past yr: - 1 - - -  Injury with Fall? - 1 - - -  Risk for fall due to : - History of fall(s) - - -  Follow up Falls evaluation completed - - - -   Timed Get Up and Go Performed: no, virtual visit  Depression Screen PHQ 2/9 Scores 09/08/2019 08/31/2019 01/13/2019 06/26/2018  PHQ - 2 Score 0 0 0 0    Cognitive Function     6CIT Screen 09/08/2019 06/26/2018  What Year? 0 points 0 points  What month? 0 points 0 points  What time? 0 points 0 points  Count back from 20 0 points 0 points  Months in reverse - 0 points  Repeat phrase - 0 points  Total Score - 0    Immunization History  Administered Date(s) Administered  . Fluad Quad(high Dose 65+) 04/15/2019  . Influenza Split 05/16/2011, 05/12/2012  . Influenza, High Dose Seasonal PF 05/05/2015, 05/03/2016, 04/24/2017, 05/25/2018  . Influenza,inj,Quad PF,6+ Mos 05/19/2013, 04/15/2014  . Pneumococcal Conjugate-13 12/01/2013  . Pneumococcal Polysaccharide-23 01/22/2011  . Tdap 05/05/2015  . Zoster 01/24/2012   Screening Tests Health Maintenance  Topic Date Due  . TETANUS/TDAP   05/04/2025  . INFLUENZA VACCINE  Completed  . PNA vac Low Risk Adult  Completed       Plan:   Keep all routine maintenance appointments.   Follow up with your doctor 04/11/20 or sooner if needed.  Medicare Attestation I have personally reviewed: The patient's medical and social history Their use of alcohol, tobacco or illicit drugs Their current medications and supplements The patient's functional ability including ADLs,fall risks, home safety risks, cognitive, and hearing and visual impairment Diet and physical activities Evidence for depression   I have reviewed and discussed with patient certain preventive protocols, quality metrics, and best practice recommendations.      Varney Biles, LPN  [id number]

## 2019-09-08 NOTE — Patient Instructions (Addendum)
  [name] , Thank you for taking time to come for your Medicare Wellness Visit. I appreciate your ongoing commitment to your health goals. Please review the following plan we discussed and let me know if I can assist you in the future.   These are the goals we discussed: Goals      Patient Stated   . DIET - INCREASE WATER INTAKE (pt-stated)       This is a list of the screening recommended for you and due dates:  Health Maintenance  Topic Date Due  . Tetanus Vaccine  05/04/2025  . Flu Shot  Completed  . Pneumonia vaccines  Completed

## 2019-09-08 NOTE — Telephone Encounter (Signed)
6 month appointment scheduled during annual wellness appointment.

## 2019-09-22 ENCOUNTER — Other Ambulatory Visit: Payer: Self-pay | Admitting: Cardiovascular Disease

## 2019-09-23 NOTE — Telephone Encounter (Signed)
Refill Request.  

## 2019-09-23 NOTE — Telephone Encounter (Signed)
Xarelto 20mg  refill request received. Pt is 86 years old, weight-95.3kg, Crea-1.36 on 08/27/2019, last seen by Dr. Rockey Situ on 07/27/2019, Diagnosis-Afib, CrCl-51.46ml/min; Dose is appropriate based on dosing criteria. Will send in refill to requested pharmacy.

## 2019-10-21 ENCOUNTER — Other Ambulatory Visit: Payer: Self-pay | Admitting: Family Medicine

## 2019-10-28 ENCOUNTER — Emergency Department
Admission: EM | Admit: 2019-10-28 | Discharge: 2019-10-28 | Disposition: A | Discharge status: Home / Self Care | Payer: Medicare HMO | Attending: Emergency Medicine | Admitting: Emergency Medicine

## 2019-10-28 ENCOUNTER — Encounter: Payer: Self-pay | Admitting: Emergency Medicine

## 2019-10-28 ENCOUNTER — Emergency Department: Payer: Medicare HMO

## 2019-10-28 ENCOUNTER — Other Ambulatory Visit: Payer: Self-pay

## 2019-10-28 DIAGNOSIS — S20212A Contusion of left front wall of thorax, initial encounter: Secondary | ICD-10-CM | POA: Diagnosis not present

## 2019-10-28 DIAGNOSIS — Y9289 Other specified places as the place of occurrence of the external cause: Secondary | ICD-10-CM | POA: Diagnosis not present

## 2019-10-28 DIAGNOSIS — Z85828 Personal history of other malignant neoplasm of skin: Secondary | ICD-10-CM | POA: Diagnosis not present

## 2019-10-28 DIAGNOSIS — S299XXA Unspecified injury of thorax, initial encounter: Secondary | ICD-10-CM | POA: Diagnosis present

## 2019-10-28 DIAGNOSIS — N183 Chronic kidney disease, stage 3 unspecified: Secondary | ICD-10-CM | POA: Diagnosis not present

## 2019-10-28 DIAGNOSIS — Z8546 Personal history of malignant neoplasm of prostate: Secondary | ICD-10-CM | POA: Insufficient documentation

## 2019-10-28 DIAGNOSIS — I129 Hypertensive chronic kidney disease with stage 1 through stage 4 chronic kidney disease, or unspecified chronic kidney disease: Secondary | ICD-10-CM | POA: Diagnosis not present

## 2019-10-28 DIAGNOSIS — I2510 Atherosclerotic heart disease of native coronary artery without angina pectoris: Secondary | ICD-10-CM | POA: Insufficient documentation

## 2019-10-28 DIAGNOSIS — Y998 Other external cause status: Secondary | ICD-10-CM | POA: Insufficient documentation

## 2019-10-28 DIAGNOSIS — I10 Essential (primary) hypertension: Secondary | ICD-10-CM

## 2019-10-28 DIAGNOSIS — Y9389 Activity, other specified: Secondary | ICD-10-CM | POA: Insufficient documentation

## 2019-10-28 DIAGNOSIS — Z79899 Other long term (current) drug therapy: Secondary | ICD-10-CM | POA: Insufficient documentation

## 2019-10-28 DIAGNOSIS — I252 Old myocardial infarction: Secondary | ICD-10-CM | POA: Insufficient documentation

## 2019-10-28 DIAGNOSIS — Z7901 Long term (current) use of anticoagulants: Secondary | ICD-10-CM | POA: Diagnosis not present

## 2019-10-28 DIAGNOSIS — M549 Dorsalgia, unspecified: Secondary | ICD-10-CM | POA: Diagnosis not present

## 2019-10-28 DIAGNOSIS — W07XXXA Fall from chair, initial encounter: Secondary | ICD-10-CM | POA: Insufficient documentation

## 2019-10-28 MED ORDER — TRAMADOL HCL 50 MG PO TABS: 50.0000 mg | ORAL_TABLET | Freq: Four times a day (QID) | ORAL | 0 refills | Status: DC | PRN

## 2019-10-28 MED ORDER — TRAMADOL HCL 50 MG PO TABS
50.0000 mg | ORAL_TABLET | Freq: Once | ORAL | Status: AC
  Administered 2019-10-28: 50 mg via ORAL
  Filled 2019-10-28: qty 1

## 2019-10-28 NOTE — ED Notes (Signed)
Pt ambulating to bathroom with cane, gait steady. Pt refused wheelchair at discharge, pt walked to lobby with this RN, gait steady. Pt discharged.

## 2019-10-28 NOTE — ED Provider Notes (Signed)
[facility] Emergency Department Provider Note ____________________________________________  Time seen: Approximately 12:29 PM  I have reviewed the triage vital signs and the nursing notes.   HISTORY  Chief Complaint Fall    HPI William Taylor is a 86 y.o. male who presents to the emergency department for evaluation and treatment of back and left rib pain.  While seated in his desk chair with wheels, he bent over to pick up something that was lying in the floor and the chair slipped out from under him.  He fell against the edge of a strong box and has had pain in the ribs and back since.  Injury occurred 2 days ago.  No relief with Tylenol.  He denies head injury.   Past Medical History:  Diagnosis Date  . Cancer [facility])    Prostate, followed by Dr. Jacqlyn Larsen  . Colon polyp   . Coronary artery disease   . Gall stones   . Hyperlipidemia   . Hypertension   . MI (myocardial infarction) ([place])    2015  . PAF (paroxysmal atrial fibrillation) ([place])    a. on xarelto  . Skin cancer     Patient Active Problem List   Diagnosis Date Noted  . 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 Osteoarthritis of knees, bilateral 08/31/2019  . Depression 01/11/2019  . Dizziness 12/15/2018  . History of GI bleed   . Malnutrition of moderate degree 09/21/2018  . Orthostatic hypotension 06/06/2018  . Chronic pain of left knee 06/05/2018  . Chronic pain in testicle 06/05/2018  . Chronic back pain 02/17/2018  . Chest pain 03/07/2017  . Angiodysplasia of intestinal tract   . Iron deficiency anemia   . Benign neoplasm of ascending colon   . Diverticulosis of large intestine without diverticulitis   . Gastric polyp   . Left rotator cuff tear arthropathy 09/11/2015  . Fatigue 04/15/2014  . Abdominal pain 01/18/2014  . Prostate cancer ([place]) 03/15/2013  . DOE (dyspnea on exertion) 11/23/2012  . Insomnia 09/03/2012  . CKD (chronic kidney disease), stage III ([place]) 07/23/2011  .  Pulmonary embolism ([place]) 01/09/2011  . Paroxysmal A-fib ([place]) 09/06/2010  . Mixed hyperlipidemia 05/19/2009  . Coronary artery disease of native artery of native heart with stable angina pectoris ([place]) 05/19/2009    Past Surgical History:  Procedure Laterality Date  . CARDIAC [facility]IZATION  [facility]   [facility]  . CATARACT EXTRACTION  Oct. 3, 2012   right eye  . colonoscopy    . COLONOSCOPY W/ POLYPECTOMY  2015   Dr Rayann Heman  . COLONOSCOPY WITH PROPOFOL N/A 08/15/2016   Procedure: COLONOSCOPY WITH PROPOFOL;  Surgeon: [name], MD;  Location: [facility] ENDOSCOPY;  Service: Endoscopy;  Laterality: N/A;  . CORONARY ANGIOPLASTY  2009   2005; s/p stent  . ENTEROSCOPY N/A 09/22/2018   Procedure: ENTEROSCOPY;  Surgeon: Lin Landsman, MD;  Location: [place];  Service: Gastroenterology;  Laterality: N/A;  . ESOPHAGOGASTRODUODENOSCOPY (EGD) WITH PROPOFOL N/A 08/15/2016   Procedure: ESOPHAGOGASTRODUODENOSCOPY (EGD) WITH PROPOFOL;  Surgeon: [name], MD;  Location: [facility] ENDOSCOPY;  Service: Endoscopy;  Laterality: N/A;  . GIVENS CAPSULE STUDY N/A 09/26/2016   Procedure: GIVENS CAPSULE STUDY;  Surgeon: [name], MD;  Location: [facility] ENDOSCOPY;  Service: Endoscopy;  Laterality: N/A;  . HERNIA REPAIR  2013  . LUMBAR SPINE SURGERY    . UPPER GI ENDOSCOPY  Sept 2015   Dr Rayann Heman    Prior to Admission medications   Medication Sig Start Date End  Date Taking? Authorizing Provider  amiodarone (PACERONE) 100 MG tablet Take 1 tablet (100 mg total) by mouth daily for 30 days. Patient taking differently: Take 50 mg by mouth daily.  12/20/18 07/27/19  Saundra Shelling, MD  amiodarone (PACERONE) 200 MG tablet Take 200 mg by mouth daily. 08/23/19   [provider]  cyproheptadine (PERIACTIN) 4 MG tablet Take 4 mg by mouth 3 (three) times daily as needed.    [provider]  Droxidopa (NORTHERA) 100 MG CAPS Take 200 mg by mouth 3 (three) times daily.    [provider]  fenofibrate  micronized (ANTARA) 130 MG capsule TAKE 1 CAPSULE BY MOUTH  DAILY BEFORE BREAKFAST 05/05/19   [name], MD  finasteride (PROSCAR) 5 MG tablet Take 1 tablet (5 mg total) by mouth daily. 08/02/19   Stoioff, Ronda Fairly, MD  fludrocortisone (FLORINEF) 0.1 MG tablet daily.  04/12/19   [provider]  levobunolol (BETAGAN) 0.5 % ophthalmic solution Place 1 drop into the right eye 2 (two) times daily.     [provider]  levothyroxine (SYNTHROID) 50 MCG tablet Take 50 mcg by mouth daily before breakfast.    [provider]  Melatonin 3 MG CAPS Take 2 capsules by mouth every evening.    [provider]  midodrine (PROAMATINE) 2.5 MG tablet Take 1-2 tablets (2.5-5 mg total) by mouth 3 (three) times daily with meals. 07/27/19   [name], MD  mirtazapine (REMERON) 15 MG tablet TAKE 1 TABLET BY MOUTH NIGHTLY 10/21/19   [name], MD  Multiple Vitamin (MULTIVITAMIN PO) Take 1 tablet by mouth daily.    [provider]  nitroGLYCERIN (NITROSTAT) 0.4 MG SL tablet DISSOLVE UNDER THE TONGUE 1 TABLET EVERY 5 MINUTES AS NEEDED FOR CHEST PAIN 06/18/19   [name], MD  pantoprazole (PROTONIX) 40 MG tablet TAKE ONE TABLET BY MOUTH TWICE DAILY 08/25/19   [name], MD  polyethylene glycol (MIRALAX / GLYCOLAX) 17 g packet Take 17 g by mouth daily as needed.    [provider]  pravastatin (PRAVACHOL) 40 MG tablet TAKE 1 TABLET BY MOUTH AT  BEDTIME 07/05/19   [name], MD  thiamine (VITAMIN B-1) 100 MG tablet Take 100 mg by mouth daily. 01/05/19 01/05/20  [provider]  traMADol (ULTRAM) 50 MG tablet Take 1 tablet (50 mg total) by mouth every 6 (six) hours as needed. 10/28/19   Dutch Ing B, FNP  XARELTO 20 MG TABS tablet TAKE 1 TABLET BY MOUTH  DAILY 09/23/19   [name], MD    Allergies Bee venom, Sulfasalazine, Sulfa antibiotics, and Warfarin and related  Family History  Problem Relation Age of Onset  .  Stroke Mother   . Colon cancer Father   . Coronary artery disease Brother   . Other Brother 38       lymes dz    Social History Social History   Tobacco Use  . Smoking status: Never Smoker  . Smokeless tobacco: Never Used  Substance Use Topics  . Alcohol use: No  . Drug use: No    Review of Systems Constitutional: Negative for fever. Cardiovascular: Negative for chest pain. Respiratory: Negative for shortness of breath. Musculoskeletal: Positive for left lateral and posterior rib pain Skin: Negative for open wound or lesion.  Neurological: Negative for decrease in sensation  ____________________________________________   PHYSICAL EXAM:  VITAL SIGNS: ED Triage Vitals  Enc Vitals Group     BP 10/28/19  1050 (!) 184/112     Pulse Rate 10/28/19 1050 89     Resp 10/28/19 1050 20     Temp 10/28/19 1050 (!) 97.4 F (36.3 C)     Temp Source 10/28/19 1050 Oral     SpO2 10/28/19 1050 98 %     Weight 10/28/19 1049 210 lb (95.3 kg)     Height 10/28/19 1049 6\' 4"  (1.93 m)     Head Circumference --      Peak Flow --      Pain Score 10/28/19 1049 10     Pain Loc --      Pain Edu? --      Excl. in [place]? --     Constitutional: Alert and oriented. Well appearing and in no acute distress. Eyes: Conjunctivae are clear without discharge or drainage Head: Atraumatic Neck: Supple. No focal midline tenderness. Respiratory: No cough. Respirations are even and unlabored. Musculoskeletal: Focal tenderness over the left lateral and posterior lower ribs. No flail segment noted. Neurologic: Awake, alert, oriented x 4.  Skin: No open wounds, lesions, or contusions.  Psychiatric: Affect and behavior are appropriate.  ____________________________________________   LABS (all labs ordered are listed, but only abnormal results are displayed)  Labs Reviewed - No data to display ____________________________________________  RADIOLOGY  No obvious rib fracture. No cardiopulmonary  abnormality.  I, Sherrie George, personally viewed and evaluated these images (plain radiographs) as part of my medical decision making, as well as reviewing the written report by the radiologist.  DG Ribs Unilateral W/Chest Left  Result Date: 10/28/2019 CLINICAL DATA:  Patient status post fall chair. Left lateral chest pain. EXAM: LEFT RIBS AND CHEST - 3+ VIEW COMPARISON:  Chest radiograph 06/17/2019. FINDINGS: Cardiac contours upper limits of normal. No large area of pulmonary consolidation. No pleural effusion or pneumothorax. No definite left-sided displaced rib fracture. Thoracic spine degenerative changes. IMPRESSION: No acute cardiopulmonary process. Electronically Signed   By: Lovey Newcomer M.D.   On: 10/28/2019 13:12   ____________________________________________   PROCEDURES  Procedures  ____________________________________________   INITIAL IMPRESSION / ASSESSMENT AND PLAN / ED COURSE  William Taylor is a 86 y.o. who presents to the emergency department for treatment and evaluation of rib and back pain post injury 2 days ago. X-ray is reassuring. While here, he was given Tramadol with decrease in pain. He has also been hypertensive, but states that he has not taken his afternoon BP medication. Plan will be to write for Tramadol and provide him with an incentive spirometer. He will be advised to follow up with primary care or return to the ER for symptoms that change or worsen or for new concern.  Medications  traMADol (ULTRAM) tablet 50 mg (50 mg Oral Given 10/28/19 1313)    Pertinent labs & imaging results that were available during my care of the patient were reviewed by me and considered in my medical decision making (see chart for details).   _________________________________________   FINAL CLINICAL IMPRESSION(S) / ED DIAGNOSES  Final diagnoses:  Rib contusion, left, initial encounter  Hypertension, unspecified type    ED Discharge Orders         Ordered     traMADol (ULTRAM) 50 MG tablet  Every 6 hours PRN     10/28/19 1403           If controlled substance prescribed during this visit, 12 month history viewed on the [place] prior to issuing an initial prescription for Schedule II or III  opiodVictorino Dike, FNP 10/28/19 1406    Blake Divine, MD 10/30/19 [phone]

## 2019-10-28 NOTE — ED Triage Notes (Signed)
Patient presents to the ED with a fall this am out of a computer chair.  Patient slipped out of the computer chair and hit his back.  Patient denies loss of consciousness and denies hitting his head.  Patient is alert and oriented x 4 at this time.

## 2019-10-28 NOTE — ED Notes (Signed)
Pt states he fell out of a desk chair with wheels Tuesday evening as he was reaching for something on the floor. Pt denies LOC or hitting head.

## 2019-11-02 ENCOUNTER — Telehealth: Payer: Self-pay | Admitting: Family Medicine

## 2019-11-02 NOTE — Telephone Encounter (Signed)
I called and spoke with the patient and informed her that he is probably really bruised in the tissue area and all he can do is use a ice compress and take tylenol until his visit on Friday @ 4pm, she wanted a call back if anything opened up earlier in the schedule.  Aysel Gilchrest,cma

## 2019-11-02 NOTE — Telephone Encounter (Signed)
Pt's wife called in and said pt fell last week and hurt his rib cage. She said they went to ER on 10/28/10 and they told him he didn't have a fracture/bruising. She said but he is getting worse and is in a lot of pain. She thinks he has damaged his soft tissue. I scheduled a virtual appt with Dr. Caryl Bis tomorrow @ 4pm. She is wondering if there is something she can do before then?

## 2019-11-03 ENCOUNTER — Other Ambulatory Visit: Payer: Self-pay

## 2019-11-03 ENCOUNTER — Encounter: Payer: Self-pay | Admitting: Family Medicine

## 2019-11-03 ENCOUNTER — Telehealth (INDEPENDENT_AMBULATORY_CARE_PROVIDER_SITE_OTHER): Payer: Medicare HMO | Admitting: Family Medicine

## 2019-11-03 DIAGNOSIS — W19XXXA Unspecified fall, initial encounter: Secondary | ICD-10-CM | POA: Diagnosis not present

## 2019-11-03 DIAGNOSIS — I951 Orthostatic hypotension: Secondary | ICD-10-CM

## 2019-11-03 NOTE — Progress Notes (Signed)
Virtual Visit via telephone Note  This visit type was conducted due to national recommendations for restrictions regarding the COVID-19 pandemic (e.g. social distancing).  This format is felt to be most appropriate for this patient at this time.  All issues noted in this document were discussed and addressed.  No physical exam was performed (except for noted visual exam findings with Video Visits).   I connected with William Taylor today at  4:00 PM EDT by telephone and verified that I am speaking with the correct person using two identifiers. Location patient: home Location provider: home office Persons participating in the virtual visit: patient, provider, Robel Potthoff (wife)  I discussed the limitations, risks, security and privacy concerns of performing an evaluation and management service by telephone and the availability of in person appointments. I also discussed with the patient that there may be a patient responsible charge related to this service. The patient expressed understanding and agreed to proceed.  Interactive audio and video telecommunications were attempted between this provider and patient, however failed, due to patient having technical difficulties OR patient did not have access to video capability.  We continued and completed visit with audio only.  Reason for visit: f/u.  HPI: Fall: Patient notes he was sitting at his computer chair and bent over to try to pick something up and the computer chair slipped out from under him.  He hit his left side at the base of his ribs near his back and hip bone.  He went to the emergency room and had an x-ray that was negative for fracture.  He never developed any bruising in that area though he did have a mild bruise on his arm.  He notes when he is sitting the pain is fine but if he moves it hurts.  He takes tramadol 1 pill only at night and that does cause some upset stomach.  He has not tried taking Tylenol with the  tramadol.  Orthostasis: Patient has been on Florinef, droxidopa, and midodrine for this.  His blood pressure has been running up to 160/109.  His cardiologist previously decreased his midodrine dose to 2.5 mg 3 times daily.  He notes no chest pain or shortness of breath.   ROS: See pertinent positives and negatives per HPI.  Past Medical History:  Diagnosis Date  . Cancer [facility])    Prostate, followed by Dr. Jacqlyn Larsen  . Colon polyp   . Coronary artery disease   . Gall stones   . Hyperlipidemia   . Hypertension   . MI (myocardial infarction) ([place])    2015  . PAF (paroxysmal atrial fibrillation) ([place])    a. on xarelto  . Skin cancer     Past Surgical History:  Procedure Laterality Date  . CARDIAC CATHETERIZATION  10/2013   [facility]  . CATARACT EXTRACTION  Oct. 3, 2012   right eye  . colonoscopy    . COLONOSCOPY W/ POLYPECTOMY  2015   Dr Rayann Heman  . COLONOSCOPY WITH PROPOFOL N/A 08/15/2016   Procedure: COLONOSCOPY WITH PROPOFOL;  Surgeon: [name], MD;  Location: [facility] ENDOSCOPY;  Service: Endoscopy;  Laterality: N/A;  . CORONARY ANGIOPLASTY  2009   2005; s/p stent  . ENTEROSCOPY N/A 09/22/2018   Procedure: ENTEROSCOPY;  Surgeon: Lin Landsman, MD;  Location: [place];  Service: Gastroenterology;  Laterality: N/A;  . ESOPHAGOGASTRODUODENOSCOPY (EGD) WITH PROPOFOL N/A 08/15/2016   Procedure: ESOPHAGOGASTRODUODENOSCOPY (EGD) WITH PROPOFOL;  Surgeon: [name], MD;  Location: [facility] ENDOSCOPY;  Service: Endoscopy;  Laterality: N/A;  . GIVENS CAPSULE STUDY N/A 09/26/2016   Procedure: GIVENS CAPSULE STUDY;  Surgeon: [name], MD;  Location: [facility] ENDOSCOPY;  Service: Endoscopy;  Laterality: N/A;  . HERNIA REPAIR  2013  . LUMBAR SPINE SURGERY    . UPPER GI ENDOSCOPY  Sept 2015   Dr Rayann Heman    Family History  Problem Relation Age of Onset  . Stroke Mother   . Colon cancer Father   . Coronary artery disease Brother   . Other Brother 59       lymes dz    SOCIAL HX:  Non-smoker   Current Outpatient Medications:  .  amiodarone (PACERONE) 200 MG tablet, Take 200 mg by mouth daily., Disp: , Rfl:  .  cyproheptadine (PERIACTIN) 4 MG tablet, Take 4 mg by mouth 3 (three) times daily as needed., Disp: , Rfl:  .  fenofibrate micronized (ANTARA) 130 MG capsule, TAKE 1 CAPSULE BY MOUTH  DAILY BEFORE BREAKFAST, Disp: 90 capsule, Rfl: 3 .  finasteride (PROSCAR) 5 MG tablet, Take 1 tablet (5 mg total) by mouth daily., Disp: 90 tablet, Rfl: 3 .  fludrocortisone (FLORINEF) 0.1 MG tablet, daily. , Disp: , Rfl:  .  levobunolol (BETAGAN) 0.5 % ophthalmic solution, Place 1 drop into the right eye 2 (two) times daily. , Disp: , Rfl:  .  levothyroxine (SYNTHROID) 50 MCG tablet, Take 50 mcg by mouth daily before breakfast., Disp: , Rfl:  .  Melatonin 3 MG CAPS, Take 2 capsules by mouth every evening., Disp: , Rfl:  .  midodrine (PROAMATINE) 2.5 MG tablet, Take 1-2 tablets (2.5-5 mg total) by mouth 3 (three) times daily with meals., Disp: 270 tablet, Rfl: 3 .  mirtazapine (REMERON) 15 MG tablet, TAKE 1 TABLET BY MOUTH NIGHTLY, Disp: 30 tablet, Rfl: 3 .  Multiple Vitamin (MULTIVITAMIN PO), Take 1 tablet by mouth daily., Disp: , Rfl:  .  nitroGLYCERIN (NITROSTAT) 0.4 MG SL tablet, DISSOLVE UNDER THE TONGUE 1 TABLET EVERY 5 MINUTES AS NEEDED FOR CHEST PAIN, Disp: 25 tablet, Rfl: 0 .  pantoprazole (PROTONIX) 40 MG tablet, TAKE ONE TABLET BY MOUTH TWICE DAILY, Disp: 60 tablet, Rfl: 5 .  polyethylene glycol (MIRALAX / GLYCOLAX) 17 g packet, Take 17 g by mouth daily as needed., Disp: , Rfl:  .  pravastatin (PRAVACHOL) 40 MG tablet, TAKE 1 TABLET BY MOUTH AT  BEDTIME, Disp: 90 tablet, Rfl: 3 .  thiamine (VITAMIN B-1) 100 MG tablet, Take 100 mg by mouth daily., Disp: , Rfl:  .  traMADol (ULTRAM) 50 MG tablet, Take 1 tablet (50 mg total) by mouth every 6 (six) hours as needed., Disp: 12 tablet, Rfl: 0 .  XARELTO 20 MG TABS tablet, TAKE 1 TABLET BY MOUTH  DAILY, Disp: 90 tablet, Rfl: 2 .   amiodarone (PACERONE) 100 MG tablet, Take 1 tablet (100 mg total) by mouth daily for 30 days. (Patient taking differently: Take 50 mg by mouth daily. ), Disp: 30 tablet, Rfl: 0 .  Droxidopa (NORTHERA) 100 MG CAPS, Take 200 mg by mouth 3 (three) times daily., Disp: 540 capsule, Rfl: 3  EXAM: This was a telehealth telephone visit thus no physical exam was completed  ASSESSMENT AND PLAN:  Discussed the following assessment and plan:  Fall Seems to have been mechanical fall out of a chair.  Will be careful with regards to that in the future.  Discussed that soft tissue injuries such as his can take quite some time to improve.  He will try  combining tramadol with Tylenol and see if that helps more with his pain.  If not beneficial he will let us know.  Orthostatic hypotension BP is running elevated.  I suspect his medication regimen is contributing.  Message has been sent to his cardiologist to determine what the next step would be regarding his midodrine and other medications.   No orders of the defined types were placed in this encounter.   No orders of the defined types were placed in this encounter.    I discussed the assessment and treatment plan with the patient. The patient was provided an opportunity to ask questions and all were answered. The patient agreed with the plan and demonstrated an understanding of the instructions.   The patient was advised to call back or seek an in-person evaluation if the symptoms worsen or if the condition fails to improve as anticipated.  I provided 17 minutes of non-face-to-face time during this encounter.   Tommi Rumps, MD

## 2019-11-05 ENCOUNTER — Telehealth: Payer: Self-pay | Admitting: Cardiovascular Disease

## 2019-11-05 ENCOUNTER — Other Ambulatory Visit: Payer: Self-pay

## 2019-11-05 MED ORDER — DROXIDOPA 100 MG PO CAPS: ORAL_CAPSULE | ORAL | 1 refills | Status: DC

## 2019-11-05 MED ORDER — DROXIDOPA 100 MG PO CAPS: 600.0000 mg | ORAL_CAPSULE | Freq: Every day | ORAL | 0 refills | Status: DC

## 2019-11-05 NOTE — Telephone Encounter (Signed)
*  STAT* If patient is at the pharmacy, call can be transferred to refill team.   1. Which medications need to be refilled? (please list name of each medication and dose if known)  Northera 100 mg capsules take 6 PO TID  2. Which pharmacy/location (including street and city if local pharmacy) is medication to be sent to? [facility]   3. Do they need a 30 day or 90 day supply? [place]

## 2019-11-05 NOTE — Telephone Encounter (Signed)
Please review for refill.  Per patients medication list it states patient is taking   Droxidopa (NORTHERA) 100 MG CAPS Take 200 mg by mouth 3 (three) times daily.   Patient is requesting Northera 100MG  6 tablets TID.  I sent in a refill for 6 tablets daily but called to cancel it because I was unsure which instructions patient should be following.

## 2019-11-05 NOTE — Telephone Encounter (Signed)
Called patient to verify how William Taylor was being taken.   Patients wife stated he currently take 600MG  daily.  She said he usually takes 2 tablets in the morning, 2 tablets at lunch, and 2 tablets at dinner.   She stated that his BP this morning was 156/83 with a HR of 79.   Patients wife also stated she thinks this medication is killing him.

## 2019-11-05 NOTE — Telephone Encounter (Signed)
Do you mind calling the patient/ his wife to see exactly how he is taking this now?  Per his last office note it looks like he was taking: Northera 100 mg- 2 capsules (200 mg) by mouth three times a day.   That is probably correct, but if you could confirm that would be great!

## 2019-11-05 NOTE — Telephone Encounter (Signed)
I spoke with [place] about the patient's wife comment that she felt like the [place] was "killing him." Per [place], Mrs. Rolison made that comment, then asked for the refill to be sent in. I have refilled Northera 100 mg- take 2 tablets (200 mg) by mouth three times a day to the [facility].   Forwarding the patient's wife's comment to Dr. Rockey Situ and his nurse for review.

## 2019-11-06 NOTE — Telephone Encounter (Signed)
Can we call and get some information, He has been on the medication for a long time and without northera,  He was essentially bed bound with dropping pressure Both he and his wife are welcome to slowly wean the medication if they feel he is having side effects Would do this very slowly such as northera 150 TID for 2 weeks, then 100 TID 2 weeks During that time would monitor orthostatics, write them down and keep [place] in the look. If he starts to have dizziness/orthostasis, we would rely on other meds to support his pressure, such as midodrine/florinef

## 2019-11-08 MED ORDER — DROXIDOPA 100 MG PO CAPS: ORAL_CAPSULE | ORAL | 3 refills | Status: DC

## 2019-11-08 NOTE — Telephone Encounter (Signed)
Spoke with patients wife per release form and she confirmed that he is taking Northera 100 mg 2 tablets 3 times a day. She states that he has been doing fine and there was some misunderstanding. She states that he does take Midodrine at different times and based on blood pressures. She did not repeat any concerns at all and just needs his refills to be sent. Refilled medication and let her know to please call back if she has any further questions.

## 2019-11-08 NOTE — Telephone Encounter (Signed)
Called and spoke with Patty confirmed information and medication refill. She transferred to pharmacy for me to clarify with them and then discussed with them that patient is taking 2 pills 3 times daily. She confirmed, read back, and we clarified patient information. She updated and will get medication shipped out.

## 2019-11-09 ENCOUNTER — Other Ambulatory Visit: Payer: Self-pay

## 2019-11-09 MED ORDER — DROXIDOPA 100 MG PO CAPS: 200.0000 mg | ORAL_CAPSULE | Freq: Three times a day (TID) | ORAL | 3 refills | Status: DC

## 2019-11-10 ENCOUNTER — Telehealth: Payer: Self-pay | Admitting: Cardiovascular Disease

## 2019-11-10 NOTE — Telephone Encounter (Signed)
We have sent this over to Dr. Rockey Situ for review. In similar situations Dr. Rockey Situ has recommended that patients do not lay flat. He should not take the midodrine unless it is needed for low blood pressures. So have him back off on that medication. This also needs to be under his My Chart for documentation. So I will put it there as well. After backing off the midodrine if he continues to have elevated pressures let us know.  ===View-only below this line===   ----- Message -----      From:Sharon S Wollman      Sent:11/10/2019  1:29 PM EDT        MS:[id number] Rockey Situ, MD   Subject:Non-Urgent Medical Question  Dr. Rockey Situ, today William Taylor's BP lying down was 191/108 w/hr 61. From 11/03/19 to today (3/24) it has been an average of 155/91.  (His average high has been 196/114 and low of 145/97.)  Today he had chest /left arm pain and took two nitroglycerin. I took his BP 15' later and it was 149/82 with heart rate 86. He hasn't felt well for months and has absolutely no energy.  He  increased his 2.5mg  Midodrine approx. a week ago (3x day). We haven't been consistent doing BP 2x day. Please advise . Thank you. Ivin Booty

## 2019-11-11 DIAGNOSIS — W19XXXA Unspecified fall, initial encounter: Secondary | ICD-10-CM | POA: Insufficient documentation

## 2019-11-11 NOTE — Assessment & Plan Note (Signed)
BP is running elevated.  I suspect his medication regimen is contributing.  Message has been sent to his cardiologist to determine what the next step would be regarding his midodrine and other medications.

## 2019-11-11 NOTE — Assessment & Plan Note (Signed)
Seems to have been mechanical fall out of a chair.  Will be careful with regards to that in the future.  Discussed that soft tissue injuries such as his can take quite some time to improve.  He will try combining tramadol with Tylenol and see if that helps more with his pain.  If not beneficial he will let us know.

## 2020-01-13 ENCOUNTER — Telehealth: Payer: Self-pay

## 2020-01-13 ENCOUNTER — Other Ambulatory Visit: Payer: Self-pay

## 2020-01-13 ENCOUNTER — Emergency Department: Payer: Medicare HMO

## 2020-01-13 ENCOUNTER — Encounter: Payer: Self-pay | Admitting: Emergency Medicine

## 2020-01-13 DIAGNOSIS — Z79899 Other long term (current) drug therapy: Secondary | ICD-10-CM | POA: Insufficient documentation

## 2020-01-13 DIAGNOSIS — I2510 Atherosclerotic heart disease of native coronary artery without angina pectoris: Secondary | ICD-10-CM | POA: Insufficient documentation

## 2020-01-13 DIAGNOSIS — Z7901 Long term (current) use of anticoagulants: Secondary | ICD-10-CM | POA: Insufficient documentation

## 2020-01-13 DIAGNOSIS — N183 Chronic kidney disease, stage 3 unspecified: Secondary | ICD-10-CM | POA: Diagnosis not present

## 2020-01-13 DIAGNOSIS — R0789 Other chest pain: Secondary | ICD-10-CM | POA: Diagnosis present

## 2020-01-13 DIAGNOSIS — I129 Hypertensive chronic kidney disease with stage 1 through stage 4 chronic kidney disease, or unspecified chronic kidney disease: Secondary | ICD-10-CM | POA: Insufficient documentation

## 2020-01-13 LAB — TROPONIN I (HIGH SENSITIVITY): Troponin I (High Sensitivity): 12 ng/L (ref <18)

## 2020-01-13 LAB — CBC
HCT: 31.6 % — ABNORMAL LOW (ref 39.0–52.0)
Hemoglobin: 10.4 g/dL — ABNORMAL LOW (ref 13.0–17.0)
MCH: 31.2 pg (ref 26.0–34.0)
MCHC: 32.9 g/dL (ref 30.0–36.0)
MCV: 94.9 fL (ref 80.0–100.0)
Platelets: 279 10*3/uL (ref 150–400)
RBC: 3.33 MIL/uL — ABNORMAL LOW (ref 4.22–5.81)
RDW: 15.4 % (ref 11.5–15.5)
WBC: 8.3 10*3/uL (ref 4.0–10.5)
nRBC: 0 % (ref 0.0–0.2)

## 2020-01-13 LAB — BASIC METABOLIC PANEL
Anion gap: 10 (ref 5–15)
BUN: 29 mg/dL — ABNORMAL HIGH (ref 8–23)
CO2: 24 mmol/L (ref 22–32)
Calcium: 9.3 mg/dL (ref 8.9–10.3)
Chloride: 105 mmol/L (ref 98–111)
Creatinine, Ser: 1.35 mg/dL — ABNORMAL HIGH (ref 0.61–1.24)
GFR calc Af Amer: 54 mL/min — ABNORMAL LOW (ref >60)
GFR calc non Af Amer: 47 mL/min — ABNORMAL LOW (ref >60)
Glucose, Bld: 116 mg/dL — ABNORMAL HIGH (ref 70–99)
Potassium: 3.9 mmol/L (ref 3.5–5.1)
Sodium: 139 mmol/L (ref 135–145)

## 2020-01-13 NOTE — Telephone Encounter (Signed)
[id number] Request Reference Number: [id number]. DROXIDOPA CAP 100MG  2 capsules TID is approved through 01/12/2021. Your patient may now fill this prescription and it will be covered.

## 2020-01-13 NOTE — ED Notes (Signed)
Full rainbow drawn at triage 

## 2020-01-13 NOTE — Telephone Encounter (Signed)
Per fax from [place], [place] for Droxidopa 100mg  is set to expire soon. PA completed in [email or web address] KEY: [id number] Waiting for plan response.

## 2020-01-13 NOTE — ED Triage Notes (Signed)
Pt reports pressure like cp every few minutes since this am. Pt reports has taken 3 nitro.

## 2020-01-14 ENCOUNTER — Emergency Department
Admission: EM | Admit: 2020-01-14 | Discharge: 2020-01-14 | Disposition: A | Discharge status: Home / Self Care | Payer: Medicare HMO | Attending: Emergency Medicine | Admitting: Emergency Medicine

## 2020-01-14 DIAGNOSIS — I10 Essential (primary) hypertension: Secondary | ICD-10-CM

## 2020-01-14 DIAGNOSIS — R079 Chest pain, unspecified: Secondary | ICD-10-CM

## 2020-01-14 LAB — TROPONIN I (HIGH SENSITIVITY): Troponin I (High Sensitivity): 14 ng/L (ref <18)

## 2020-01-14 NOTE — ED Provider Notes (Signed)
[facility] Emergency Department Provider Note  ____________________________________________   First MD Initiated Contact with Patient 01/14/20 [phone]     (approximate)  I have reviewed the triage vital signs and the nursing notes.   HISTORY  Chief Complaint Chest Pain    HPI William Taylor is a 86 y.o. male with medication as listed below who presents for evaluation of chest pain.  He said that yesterday he was sitting at home and felt some sharp prickling stabbing pains in the left side of his chest.  He said it felt like he was being poked or stabbed in the chest once every few seconds.  This continued for a while so he came to the ED for evaluation.  The chest pain went away while he was waiting in the lobby and he has waited a very long time to be seen.  He had no shortness of breath.  He denies fever, cough, nausea, vomiting, and abdominal pain.  There is no radiation to the arm or the jaw.  There is no ongoing chest pain although he did take some nitroglycerin at home.  He is a patient of Dr. Rockey Situ with cardiology and has an appointment with him next week.  No recent injuries or falls although he is prone to falls.  He has been evaluated at [facility] and at [facility] for issues related to sudden drops in blood pressure.   The onset of the pain was acute and the symptoms were moderate in intensity and have resolved.  Nothing in particular made them better or worse.        Past Medical History:  Diagnosis Date  . Cancer [facility])    Prostate, followed by Dr. Jacqlyn Larsen  . Colon polyp   . Coronary artery disease   . Gall stones   . Hyperlipidemia   . Hypertension   . MI (myocardial infarction) ([place])    2015  . PAF (paroxysmal atrial fibrillation) ([place])    a. on xarelto  . Skin cancer     Patient Active Problem List   Diagnosis Date Noted  . Fall 11/11/2019  . 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 Osteoarthritis of knees, bilateral 08/31/2019  . Depression  01/11/2019  . Dizziness 12/15/2018  . History of GI bleed   . Malnutrition of moderate degree 09/21/2018  . Orthostatic hypotension 06/06/2018  . Chronic pain of left knee 06/05/2018  . Chronic pain in testicle 06/05/2018  . Chronic back pain 02/17/2018  . Chest pain 03/07/2017  . Angiodysplasia of intestinal tract   . Iron deficiency anemia   . Benign neoplasm of ascending colon   . Diverticulosis of large intestine without diverticulitis   . Gastric polyp   . Left rotator cuff tear arthropathy 09/11/2015  . Fatigue 04/15/2014  . Abdominal pain 01/18/2014  . Prostate cancer ([place]) 03/15/2013  . DOE (dyspnea on exertion) 11/23/2012  . Insomnia 09/03/2012  . CKD (chronic kidney disease), stage III ([place]) 07/23/2011  . Pulmonary embolism ([place]) 01/09/2011  . Paroxysmal A-fib ([place]) 09/06/2010  . Mixed hyperlipidemia 05/19/2009  . Coronary artery disease of native artery of native heart with stable angina pectoris ([place]) 05/19/2009    Past Surgical History:  Procedure Laterality Date  . CARDIAC CATHETERIZATION  10/2013   [facility]  . CATARACT EXTRACTION  Oct. 3, 2012   right eye  . colonoscopy    . COLONOSCOPY W/ POLYPECTOMY  2015   Dr Rayann Heman  . COLONOSCOPY WITH PROPOFOL N/A 08/15/2016  Procedure: COLONOSCOPY WITH PROPOFOL;  Surgeon: [name], MD;  Location: [facility] ENDOSCOPY;  Service: Endoscopy;  Laterality: N/A;  . CORONARY ANGIOPLASTY  2009   2005; s/p stent  . ENTEROSCOPY N/A 09/22/2018   Procedure: ENTEROSCOPY;  Surgeon: Lin Landsman, MD;  Location: [facility];  Service: Gastroenterology;  Laterality: N/A;  . ESOPHAGOGASTRODUODENOSCOPY (EGD) WITH PROPOFOL N/A 08/15/2016   Procedure: ESOPHAGOGASTRODUODENOSCOPY (EGD) WITH PROPOFOL;  Surgeon: [name], MD;  Location: [facility] ENDOSCOPY;  Service: Endoscopy;  Laterality: N/A;  . GIVENS CAPSULE STUDY N/A 09/26/2016   Procedure: GIVENS CAPSULE STUDY;  Surgeon: [name], MD;  Location: [facility] ENDOSCOPY;  Service: Endoscopy;   Laterality: N/A;  . HERNIA REPAIR  2013  . LUMBAR SPINE SURGERY    . UPPER GI ENDOSCOPY  Sept 2015   Dr Rayann Heman    Prior to Admission medications   Medication Sig Start Date End Date Taking? Authorizing Provider  amiodarone (PACERONE) 100 MG tablet Take 1 tablet (100 mg total) by mouth daily for 30 days. Patient taking differently: Take 50 mg by mouth daily.  12/20/18 07/27/19  Saundra Shelling, MD  amiodarone (PACERONE) 200 MG tablet Take 200 mg by mouth daily. 08/23/19   [provider]  cyproheptadine (PERIACTIN) 4 MG tablet Take 4 mg by mouth 3 (three) times daily as needed.    [provider]  Droxidopa (NORTHERA) 100 MG CAPS Take 200 mg by mouth 3 (three) times daily. 11/09/19   [name], MD  fenofibrate micronized (ANTARA) 130 MG capsule TAKE 1 CAPSULE BY MOUTH  DAILY BEFORE BREAKFAST 05/05/19   [name], MD  finasteride (PROSCAR) 5 MG tablet Take 1 tablet (5 mg total) by mouth daily. 08/02/19   Stoioff, Ronda Fairly, MD  fludrocortisone (FLORINEF) 0.1 MG tablet daily.  04/12/19   [provider]  levobunolol (BETAGAN) 0.5 % ophthalmic solution Place 1 drop into the right eye 2 (two) times daily.     [provider]  levothyroxine (SYNTHROID) 50 MCG tablet Take 50 mcg by mouth daily before breakfast.    [provider]  Melatonin 3 MG CAPS Take 2 capsules by mouth every evening.    [provider]  midodrine (PROAMATINE) 2.5 MG tablet Take 1-2 tablets (2.5-5 mg total) by mouth 3 (three) times daily with meals. 07/27/19   [name], MD  mirtazapine (REMERON) 15 MG tablet TAKE 1 TABLET BY MOUTH NIGHTLY 10/21/19   [name], MD  Multiple Vitamin (MULTIVITAMIN PO) Take 1 tablet by mouth daily.    [provider]  nitroGLYCERIN (NITROSTAT) 0.4 MG SL tablet DISSOLVE UNDER THE TONGUE 1 TABLET EVERY 5 MINUTES AS NEEDED FOR CHEST PAIN 06/18/19   [name], MD  pantoprazole (PROTONIX) 40 MG tablet TAKE ONE  TABLET BY MOUTH TWICE DAILY 08/25/19   [name], MD  polyethylene glycol (MIRALAX / GLYCOLAX) 17 g packet Take 17 g by mouth daily as needed.    [provider]  pravastatin (PRAVACHOL) 40 MG tablet TAKE 1 TABLET BY MOUTH AT  BEDTIME 07/05/19   [name], MD  traMADol (ULTRAM) 50 MG tablet Take 1 tablet (50 mg total) by mouth every 6 (six) hours as needed. 10/28/19   Triplett, Cari B, FNP  XARELTO 20 MG TABS tablet TAKE 1 TABLET BY MOUTH  DAILY 09/23/19   [name], MD    Allergies Bee venom, Sulfasalazine, Sulfa antibiotics, and Warfarin and related  Family History  Problem Relation Age of Onset  .  Stroke Mother   . Colon cancer Father   . Coronary artery disease Brother   . Other Brother 8       lymes dz    Social History Social History   Tobacco Use  . Smoking status: Never Smoker  . Smokeless tobacco: Never Used  Substance Use Topics  . Alcohol use: No  . Drug use: No    Review of Systems Constitutional: No fever/chills Eyes: No visual changes. ENT: No sore throat. Cardiovascular: Acute onset sharp stabbing chest pain that felt like he was being poked.  It has resolved completely and he has no ongoing discomfort. Respiratory: Denies shortness of breath. Gastrointestinal: No abdominal pain.  No nausea, no vomiting.  No diarrhea.  No constipation. Genitourinary: Negative for dysuria. Musculoskeletal: Negative for neck pain.  Negative for back pain. Integumentary: Negative for rash. Neurological: Negative for headaches, focal weakness or numbness.   ____________________________________________   PHYSICAL EXAM:  VITAL SIGNS: ED Triage Vitals  Enc Vitals Group     BP 01/13/20 1706 (!) 164/109     Pulse Rate 01/13/20 1706 87     Resp 01/13/20 1706 19     Temp 01/13/20 1706 98.4 F (36.9 C)     Temp Source 01/13/20 1706 Oral     SpO2 01/13/20 1706 96 %     Weight 01/13/20 1704 93.4 kg (206 lb)     Height 01/13/20 1704 1.93 m (6\' 4" )      Head Circumference --      Peak Flow --      Pain Score 01/13/20 1703 7     Pain Loc --      Pain Edu? --      Excl. in [place]? --     Constitutional: Alert and oriented.  Elderly but sharp, alert and oriented, no acute distress at this time. Eyes: Conjunctivae are normal.  Head: Atraumatic. Nose: No congestion/rhinnorhea. Mouth/Throat: Patient is wearing a mask. Neck: No stridor.  No meningeal signs.   Cardiovascular: Normal rate, regular rhythm. Good peripheral circulation. Grossly normal heart sounds. Respiratory: Normal respiratory effort.  No retractions. Gastrointestinal: Soft and nontender. No distention.  Musculoskeletal: No lower extremity tenderness nor edema. No gross deformities of extremities. Neurologic:  Normal speech and language. No gross focal neurologic deficits are appreciated.  Skin:  Skin is warm, dry and intact. Psychiatric: Mood and affect are normal. Speech and behavior are normal.  ____________________________________________   LABS (all labs ordered are listed, but only abnormal results are displayed)  Labs Reviewed  BASIC METABOLIC PANEL - Abnormal; Notable for the following components:      Result Value   Glucose, Bld 116 (*)    BUN 29 (*)    Creatinine, Ser 1.35 (*)    GFR calc non Af Amer 47 (*)    GFR calc Af Amer 54 (*)    All other components within normal limits  CBC - Abnormal; Notable for the following components:   RBC 3.33 (*)    Hemoglobin 10.4 (*)    HCT 31.6 (*)    All other components within normal limits  TROPONIN I (HIGH SENSITIVITY)  TROPONIN I (HIGH SENSITIVITY)   ____________________________________________  EKG  ED ECG REPORT I, Hinda Kehr, the attending physician, personally viewed and interpreted this ECG.  Date: 01/13/2020 EKG Time: 17: 05 Rate: 89 Rhythm: normal sinus rhythm with premature atrial complexes QRS Axis: Left axis deviation Intervals: normal ST/T Wave abnormalities: Non-specific ST segment /  T-wave changes, but  no clear evidence of acute ischemia. Narrative Interpretation: no definitive evidence of acute ischemia; does not meet STEMI criteria.   ____________________________________________  RADIOLOGY I, Hinda Kehr, personally viewed and evaluated these images (plain radiographs) as part of my medical decision making, as well as reviewing the written report by the radiologist.  ED MD interpretation: No acute abnormality on chest x-ray  Official radiology report(s): DG Chest 2 View  Result Date: 01/13/2020 CLINICAL DATA:  Left-sided chest pain since this morning EXAM: CHEST - 2 VIEW COMPARISON:  10/28/2019 FINDINGS: Frontal and lateral views of the chest demonstrate an unremarkable cardiac silhouette. No airspace disease, effusion, or pneumothorax. Lungs remain hyperinflated. No acute bony abnormalities. IMPRESSION: 1. Hyperinflation and background emphysema. No acute intrathoracic process. Electronically Signed   By: Randa Ngo M.D.   On: 01/13/2020 18:45    ____________________________________________   PROCEDURES   Procedure(s) performed (including Critical Care):  .1-3 Lead EKG Interpretation Performed by: Hinda Kehr, MD Authorized by: Hinda Kehr, MD     Interpretation: normal     ECG rate:  98   ECG rate assessment: normal     Rhythm: sinus rhythm     Ectopy: none     Conduction: normal       ____________________________________________   INITIAL IMPRESSION / MDM / ASSESSMENT AND PLAN / ED COURSE  As part of my medical decision making, I reviewed the following data within the [place] notes reviewed and incorporated, Labs reviewed , EKG interpreted , Old chart reviewed, Radiograph reviewed  and Notes from prior ED visits   Differential diagnosis includes, but is not limited to, musculoskeletal pain, angina, ACS including unstable angina, PE, pneumonia, pneumothorax, bronchitis.  The patient was on the cardiac  monitor to evaluate for evidence of arrhythmia and/or significant heart rate changes.  The patient waited an excessively long time for evaluation due to overwhelming ED and hospital volumes.  As result of his long wait he missed both his evening medications and his morning medications.  He is significantly hypertensive at the time that I evaluated him with a blood pressure of about 200/100.  However he showed me a log of his blood pressures and even at home when he is taking his medications his systolic blood pressure seems to run between about 165 and 185.  Additionally, he tells me that he has an issue where his blood pressure will be very high ("over 200 on the top number") and then drop precipitously when he stands up or changes position which causes him to pass out.  He has been evaluated extensively both at this hospital and at [facility] for this issue and they are working on medication changes to maximize his medical therapy and prevent syncope.  He says he is compliant with his medications including midodrine, Northera, etc. (see medication list for details).  The patient's lab work is reassuring with no evidence of acute abnormality including 2 high-sensitivity troponins.  His EKG shows no sign of ischemia and his chest x-ray is normal.  As documented above, his chest pain resolved while he was waiting and he has been in the emergency department for approximately 14 hours.  No signs of arrhythmia.  No respiratory symptoms, no ongoing chest pain.  I discussed with him the fact that I do not feel he would benefit from coming into the hospital and he agrees that he does not want to stay if it is not necessary.  I recommended that the patient follow-up as scheduled  with Dr. Rockey Situ his cardiologist next week.  I also told him I would send a message via [facility] to Dr. Rockey Situ to let him know of the visit.  I stressed to him the importance of taking his medication including his blood pressure medicine when he gets  home and I recommended that he not recheck his blood pressure until later tonight to allow the medication a chance to work.  He understands and agrees with the plan and is pain-free and in no distress at the time of discharge.  I gave my usual and customary return precautions.   ____________________________________________  FINAL CLINICAL IMPRESSION(S) / ED DIAGNOSES  Final diagnoses:  Chest pain, unspecified type  Essential hypertension     MEDICATIONS GIVEN DURING THIS VISIT:  Medications - No data to display   ED Discharge Orders    None      *Please note:  William Taylor was evaluated in Emergency Department on 01/14/2020 for the symptoms described in the history of present illness. He was evaluated in the context of the global COVID-19 pandemic, which necessitated consideration that the patient might be at risk for infection with the SARS-CoV-2 virus that causes COVID-19. Institutional protocols and algorithms that pertain to the evaluation of patients at risk for COVID-19 are in a state of rapid change based on information released by regulatory bodies including the CDC and federal and state organizations. These policies and algorithms were followed during the patient's care in the ED.  Some ED evaluations and interventions may be delayed as a result of limited staffing during the pandemic.*  Note:  This document was prepared using Dragon voice recognition software and may include unintentional dictation errors.   Hinda Kehr, MD 01/14/20 [phone]

## 2020-01-14 NOTE — Discharge Instructions (Addendum)
As we discussed, your work-up overnight was reassuring.  Your lab work was essentially normal including your troponin, which is a measure of whether or not your heart is sustaining any damage (such as with a heart attack).  Your EKG looks normal.  Your chest x-ray shows no sign of infection.  There is no indication that you have an emergent medical condition tonight and as we discussed, there is no reason to keep you in the hospital.  Dr. Rockey Situ will receive a note to let him know that you were here in the emergency department and I encourage you to follow-up with him by phone and as scheduled for your follow-up appointment next week.  Please note that your blood pressure is elevated, but in addition to you having high blood pressure normally, you have missed 2 doses of your medication due to your unfortunately long wait in the emergency department.  Please take your regular morning medications once you get home.  I recommend that you not check your blood pressure at least until later tonight; allow the medication a chance to work.     Return to the emergency department if you develop new or worsening symptoms that concern you.

## 2020-01-24 NOTE — Progress Notes (Signed)
Date:  01/26/2020   ID:  [name], DOB 1930-02-18, MRN [id number]  Patient Location:  [facility] [address]   Provider location:   [facility] [place] office  PCP:  [name], MD  Cardiologist:  [facility]  Chief Complaint  Patient presents with  . Other    6 mobnth follow up. ED 01/14/2020- Chest pain. Meds reviewed vervally with paitent.     History of Present Illness:    William Taylor is a 86 y.o. male  past medical history of coronary artery disease, stent in 2005,  atrial fibrillation with RVR in the setting of UTI September 2011,  stent placement May 2012 to the OM 2,  PE following catheterization started on anticoagulation, atrial fibrillation in 2013,  Last atrial fibrillation July 2018  atypical chest pain July 2018 History of anemia Stress test low risk in 06/2019 who presents for follow-up of his coronary disease and atrial fibrillation  Recently seen in the emergency room December 15, 2019 for chest pain Was sitting at home when he felt some sharp prickling pains in the left chest, like he was being poked every 6 couple of seconds Symptoms went away while waiting in the lobby to be seen in the emergency room Blood pressure in the emergency room 164/109 BUN 29 creatinine 1.35 hemoglobin 10.4  He continues on Northera 200 3 times daily Midodrine 2.5 mg 3 times daily Florinef 0.1 milligrams daily  Weight has been running higher, at least 13 pounds above last years numbers Was up even higher but he has been watching his diet  "pressure high" At home pressure avg 180, rare 160 Not checking orthostatics Stopped midorine, pressure still high Minimal orthostasis symptoms at home  EKG personally reviewed by myself on todays visit NSR rate 97, no significant ST-T wave changes  Other past medical history reviewed Normal echo 08/2018  Monitor zio for 2 weeks, results reviewed avg HR of 85 bpm.  Predominant underlying rhythm was Sinus Rhythm. 6 Supraventricular Tachycardia runs occurred, the run with the fastest interval lasting 7 beats with a max rate of 197 bpm, the longest lasting 18 beats with an avg rate of 126 bpm. Isolated SVEs were occasional (1.2%, 19914), SVE Couplets were rare (<1.0%, 14), and no SVE Triplets were present. Isolated VEs were rare (<1.0%), VE Couplets were rare (<1.0%), and no VE Triplets were present.   Prior CV studies:   The following studies were reviewed today:   Past Medical History:  Diagnosis Date  . Cancer [facility])    Prostate, followed by Dr. Jacqlyn Larsen  . Colon polyp   . Coronary artery disease   . Gall stones   . Hyperlipidemia   . Hypertension   . MI (myocardial infarction) ([place])    2015  . PAF (paroxysmal atrial fibrillation) ([place])    a. on xarelto  . Skin cancer    Past Surgical History:  Procedure Laterality Date  . CARDIAC CATHETERIZATION  10/2013   [facility]  . CATARACT EXTRACTION  Oct. 3, 2012   right eye  . colonoscopy    . COLONOSCOPY W/ POLYPECTOMY  2015   Dr Rayann Heman  . COLONOSCOPY WITH PROPOFOL N/A 08/15/2016   Procedure: COLONOSCOPY WITH PROPOFOL;  Surgeon: [name], MD;  Location: [facility] ENDOSCOPY;  Service: Endoscopy;  Laterality: N/A;  . CORONARY ANGIOPLASTY  2009   2005; s/p stent  . ENTEROSCOPY N/A 09/22/2018   Procedure: ENTEROSCOPY;  Surgeon:  Lin Landsman, MD;  Location: [facility] ENDOSCOPY;  Service: Gastroenterology;  Laterality: N/A;  . ESOPHAGOGASTRODUODENOSCOPY (EGD) WITH PROPOFOL N/A 08/15/2016   Procedure: ESOPHAGOGASTRODUODENOSCOPY (EGD) WITH PROPOFOL;  Surgeon: [name], MD;  Location: [facility] ENDOSCOPY;  Service: Endoscopy;  Laterality: N/A;  . GIVENS CAPSULE STUDY N/A 09/26/2016   Procedure: GIVENS CAPSULE STUDY;  Surgeon: [name], MD;  Location: [facility] ENDOSCOPY;  Service: Endoscopy;  Laterality: N/A;  . HERNIA REPAIR  2013  . LUMBAR SPINE SURGERY    . UPPER GI ENDOSCOPY  Sept 2015   Dr Rayann Heman     No outpatient  medications have been marked as taking for the 01/26/20 encounter (Office Visit) with [name], MD.     Allergies:   Bee venom, Sulfasalazine, Sulfa antibiotics, and Warfarin and related   Social History   Tobacco Use  . Smoking status: Never Smoker  . Smokeless tobacco: Never Used  Substance Use Topics  . Alcohol use: No  . Drug use: No     Current Outpatient Medications on File Prior to Visit  Medication Sig Dispense Refill  . amiodarone (PACERONE) 100 MG tablet Take 1 tablet (100 mg total) by mouth daily for 30 days. (Patient taking differently: Take 50 mg by mouth daily. ) 30 tablet 0  . cyproheptadine (PERIACTIN) 4 MG tablet Take 4 mg by mouth 3 (three) times daily as needed.    . Droxidopa (NORTHERA) 100 MG CAPS Take 200 mg by mouth 3 (three) times daily. 540 capsule 3  . fenofibrate micronized (ANTARA) 130 MG capsule TAKE 1 CAPSULE BY MOUTH  DAILY BEFORE BREAKFAST 90 capsule 3  . finasteride (PROSCAR) 5 MG tablet Take 1 tablet (5 mg total) by mouth daily. 90 tablet 3  . fludrocortisone (FLORINEF) 0.1 MG tablet daily.     Marland Kitchen levobunolol (BETAGAN) 0.5 % ophthalmic solution Place 1 drop into the right eye 2 (two) times daily.     . Melatonin 3 MG CAPS Take 2 capsules by mouth every evening.    . midodrine (PROAMATINE) 2.5 MG tablet Take 1-2 tablets (2.5-5 mg total) by mouth 3 (three) times daily with meals. 270 tablet 3  . mirtazapine (REMERON) 15 MG tablet TAKE 1 TABLET BY MOUTH NIGHTLY 30 tablet 3  . Multiple Vitamin (MULTIVITAMIN PO) Take 1 tablet by mouth daily.    . nitroGLYCERIN (NITROSTAT) 0.4 MG SL tablet DISSOLVE UNDER THE TONGUE 1 TABLET EVERY 5 MINUTES AS NEEDED FOR CHEST PAIN 25 tablet 0  . pantoprazole (PROTONIX) 40 MG tablet TAKE ONE TABLET BY MOUTH TWICE DAILY 60 tablet 5  . polyethylene glycol (MIRALAX / GLYCOLAX) 17 g packet Take 17 g by mouth daily as needed.    . pravastatin (PRAVACHOL) 40 MG tablet TAKE 1 TABLET BY MOUTH AT  BEDTIME 90 tablet 3  .  traMADol (ULTRAM) 50 MG tablet Take 1 tablet (50 mg total) by mouth every 6 (six) hours as needed. 12 tablet 0  . XARELTO 20 MG TABS tablet TAKE 1 TABLET BY MOUTH  DAILY 90 tablet 2   No current facility-administered medications on file prior to visit.     Family Hx: The patient's family history includes Colon cancer in his father; Coronary artery disease in his brother; Other (age of onset: 80) in his brother; Stroke in his mother.  ROS:   Please see the history of present illness.    Review of Systems  Constitutional: Positive for malaise/fatigue.  Respiratory: Negative.   Cardiovascular: Negative.   Gastrointestinal: Negative.  Musculoskeletal: Negative.   Neurological: Positive for dizziness.  Psychiatric/Behavioral: Negative.   All other systems reviewed and are negative.   Labs/Other Tests and Data Reviewed:    Recent Labs: 08/27/2019: ALT 13 01/13/2020: BUN 29; Creatinine, Ser 1.35; Hemoglobin 10.4; Platelets 279; Potassium 3.9; Sodium 139   Recent Lipid Panel Lab Results  Component Value Date/Time   CHOL 110 06/28/2016 11:06 AM   CHOL 119 10/27/2013 06:09 AM   TRIG 80.0 06/28/2016 11:06 AM   TRIG 61 10/27/2013 06:09 AM   HDL 29.90 (L) 06/28/2016 11:06 AM   HDL 38 (L) 10/27/2013 06:09 AM   CHOLHDL 4 06/28/2016 11:06 AM   LDLCALC 64 06/28/2016 11:06 AM   LDLCALC 69 10/27/2013 06:09 AM    Wt Readings from Last 3 Encounters:  01/26/20 212 lb (96.2 kg)  01/13/20 206 lb (93.4 kg)  11/03/19 220 lb (99.8 kg)     Exam:    BP (!) 148/90 (BP Location: Left Arm, Patient Position: Sitting, Cuff Size: Normal)   Pulse 97   Ht 6\' 4"  (1.93 m)   Wt 212 lb (96.2 kg)   SpO2 98%   BMI 25.81 kg/m  Constitutional:  oriented to person, place, and time. No distress.  HENT:  Head: Grossly normal Eyes:  no discharge. No scleral icterus.  Neck: No JVD, no carotid bruits  Cardiovascular: Regular rate and rhythm, no murmurs appreciated Pulmonary/Chest: Clear to auscultation  bilaterally, no wheezes or rails Abdominal: Soft.  no distension.  no tenderness.  Musculoskeletal: Normal range of motion Neurological:  normal muscle tone. Coordination normal. No atrophy Skin: Skin warm and dry Psychiatric: normal affect, pleasant   ASSESSMENT & PLAN:    Severe orthostasis on the Northera with Florinef Only taking midodrine as needed Numbers improved with weight gain He reports blood pressure continues to run high on a regular basis No orthostasis numbers available Denies having significant orthostasis symptoms --- We will recommend he decrease Florinef down to every other day 0.1 mg  Mixed hyperlipidemia Cholesterol is at goal on the current lipid regimen. No changes to the medications were made. Stable  Coronary artery disease involving native coronary artery of native heart without angina pectoris stress test no ischemia,  Currently with no symptoms of angina. No further workup at this time. Continue current medication regimen.  Atrial fibrillation, unspecified type ([place]) on Xarelto Continue low-dose amiodarone  event monitor no significant atrial fibrillation Maintaining normal sinus rhythm  Chronic nausea Resolved, weight up 13 pounds Stable weight  Depression Stable, good mood today, active  Chronic kidney disease (CKD), stage III (moderate) Stable, creatinine 1.2 up to 1.35  Iron deficiency anemia, unspecified iron deficiency anemia type Chronic hemoglobin in the 10 range Stable over the past year    Total encounter time more than 25 minutes  Greater than 50% was spent in counseling and coordination of care with the patient   Disposition: Follow-up in 6 months   Signed, Ida Rogue, MD  01/26/2020 10:38 AM    [place] Office [address], [place],  [address]

## 2020-01-26 ENCOUNTER — Ambulatory Visit: Payer: Medicare HMO | Admitting: Cardiovascular Disease

## 2020-01-26 ENCOUNTER — Encounter: Payer: Self-pay | Admitting: Cardiovascular Disease

## 2020-01-26 ENCOUNTER — Other Ambulatory Visit: Payer: Self-pay

## 2020-01-26 VITALS — BP 148/90 | HR 97 | Ht 76.0 in | Wt 212.0 lb

## 2020-01-26 DIAGNOSIS — I480 Paroxysmal atrial fibrillation: Secondary | ICD-10-CM | POA: Diagnosis not present

## 2020-01-26 DIAGNOSIS — I25118 Atherosclerotic heart disease of native coronary artery with other forms of angina pectoris: Secondary | ICD-10-CM | POA: Diagnosis not present

## 2020-01-26 DIAGNOSIS — I951 Orthostatic hypotension: Secondary | ICD-10-CM

## 2020-01-26 DIAGNOSIS — R5382 Chronic fatigue, unspecified: Secondary | ICD-10-CM

## 2020-01-26 DIAGNOSIS — Z86711 Personal history of pulmonary embolism: Secondary | ICD-10-CM | POA: Diagnosis not present

## 2020-01-26 DIAGNOSIS — E782 Mixed hyperlipidemia: Secondary | ICD-10-CM

## 2020-01-26 MED ORDER — FLUDROCORTISONE ACETATE 0.1 MG PO TABS: 0.1000 mg | ORAL_TABLET | Freq: Every day | ORAL | 3 refills | Status: DC

## 2020-01-26 NOTE — Patient Instructions (Signed)
Medication Instructions:  Please decrease the florinef/fludrocortisone down to one pill every other day  Monitor pressure when standing  If you need a refill on your cardiac medications before your next appointment, please call your pharmacy.    Lab work: No new labs needed   If you have labs (blood work) drawn today and your tests are completely normal, you will receive your results only by: Marland Kitchen MyChart Message (if you have MyChart) OR . A paper copy in the mail If you have any lab test that is abnormal or we need to change your treatment, we will call you to review the results.   Testing/Procedures: No new testing needed   Follow-Up: At [facility], you and your health needs are our priority.  As part of our continuing mission to provide you with exceptional heart care, we have created designated Provider Care Teams.  These Care Teams include your primary Cardiologist (physician) and Advanced Practice Providers (APPs -  Physician Assistants and Nurse Practitioners) who all work together to provide you with the care you need, when you need it.  . You will need a follow up appointment in 6 months .  Marland Kitchen Providers on your designated Care Team:   . Murray Hodgkins, NP . Christell Faith, PA-C . Marrianne Mood, PA-C  Any Other Special Instructions Will Be Listed Below (If Applicable).  For educational health videos Log in to : [email or web address] Or : [email or web address], password : [email or web address]

## 2020-01-29 ENCOUNTER — Other Ambulatory Visit: Payer: Self-pay | Admitting: Cardiovascular Disease

## 2020-02-15 ENCOUNTER — Other Ambulatory Visit: Payer: Self-pay | Admitting: *Deleted

## 2020-02-15 MED ORDER — DROXIDOPA 100 MG PO CAPS: 200.0000 mg | ORAL_CAPSULE | Freq: Three times a day (TID) | ORAL | 3 refills | Status: DC

## 2020-02-16 ENCOUNTER — Other Ambulatory Visit: Payer: Self-pay | Admitting: *Deleted

## 2020-02-16 MED ORDER — AMIODARONE HCL 100 MG PO TABS: 100.0000 mg | ORAL_TABLET | Freq: Every day | ORAL | 3 refills | Status: DC

## 2020-02-16 NOTE — Telephone Encounter (Signed)
Per Dr. Rockey Situ note 01/2020 'continue low dose Amiodarone'  Okay to refill Amiodarone 100mg  daily.   TY!  Loel Dubonnet, NP

## 2020-03-23 ENCOUNTER — Other Ambulatory Visit: Payer: Self-pay | Admitting: Family Medicine

## 2020-03-28 ENCOUNTER — Other Ambulatory Visit: Payer: Self-pay | Admitting: Family Medicine

## 2020-04-11 ENCOUNTER — Other Ambulatory Visit: Payer: Self-pay

## 2020-04-11 ENCOUNTER — Ambulatory Visit: Payer: Medicare HMO | Admitting: Family Medicine

## 2020-04-11 ENCOUNTER — Encounter: Payer: Self-pay | Admitting: Family Medicine

## 2020-04-11 ENCOUNTER — Telehealth: Payer: Self-pay | Admitting: Family Medicine

## 2020-04-11 DIAGNOSIS — I951 Orthostatic hypotension: Secondary | ICD-10-CM

## 2020-04-11 DIAGNOSIS — M170 Bilateral primary osteoarthritis of knee: Secondary | ICD-10-CM

## 2020-04-11 NOTE — Assessment & Plan Note (Signed)
Patient with continued aching in his knees and difficulty moving related to this.  We will have him do exercises at home as physical therapy was not previously beneficial.

## 2020-04-11 NOTE — Telephone Encounter (Signed)
Please let the patient know I heard back from Dr Rockey Situ.  He recommended that the patient stop the Florinef first and will see how he does.  He should remain on the droxidopa at this time.  He will follow up with me as planned.  If his blood pressure continues to run high or if he starts to feel lightheaded he should let us know.

## 2020-04-11 NOTE — Patient Instructions (Signed)
Knee Exercises Ask your health care provider which exercises are safe for you. Do exercises exactly as told by your health care provider and adjust them as directed. It is normal to feel mild stretching, pulling, tightness, or discomfort as you do these exercises. Stop right away if you feel sudden pain or your pain gets worse. Do not begin these exercises until told by your health care provider. Stretching and range-of-motion exercises These exercises warm up your muscles and joints and improve the movement and flexibility of your knee. These exercises also help to relieve pain and swelling. Knee extension, prone 1. Lie on your abdomen (prone position) on a bed. 2. Place your left / right knee just beyond the edge of the surface so your knee is not on the bed. You can put a towel under your left / right thigh just above your kneecap for comfort. 3. Relax your leg muscles and allow gravity to straighten your knee (extension). You should feel a stretch behind your left / right knee. 4. Hold this position for __________ seconds. 5. Scoot up so your knee is supported between repetitions. Repeat __________ times. Complete this exercise __________ times a day. Knee flexion, active  1. Lie on your back with both legs straight. If this causes back discomfort, bend your left / right knee so your foot is flat on the floor. 2. Slowly slide your left / right heel back toward your buttocks. Stop when you feel a gentle stretch in the front of your knee or thigh (flexion). 3. Hold this position for __________ seconds. 4. Slowly slide your left / right heel back to the starting position. Repeat __________ times. Complete this exercise __________ times a day. Quadriceps stretch, prone  1. Lie on your abdomen on a firm surface, such as a bed or padded floor. 2. Bend your left / right knee and hold your ankle. If you cannot reach your ankle or pant leg, loop a belt around your foot and grab the belt  instead. 3. Gently pull your heel toward your buttocks. Your knee should not slide out to the side. You should feel a stretch in the front of your thigh and knee (quadriceps). 4. Hold this position for __________ seconds. Repeat __________ times. Complete this exercise __________ times a day. Hamstring, supine 1. Lie on your back (supine position). 2. Loop a belt or towel over the ball of your left / right foot. The ball of your foot is on the walking surface, right under your toes. 3. Straighten your left / right knee and slowly pull on the belt to raise your leg until you feel a gentle stretch behind your knee (hamstring). ? Do not let your knee bend while you do this. ? Keep your other leg flat on the floor. 4. Hold this position for __________ seconds. Repeat __________ times. Complete this exercise __________ times a day. Strengthening exercises These exercises build strength and endurance in your knee. Endurance is the ability to use your muscles for a long time, even after they get tired. Quadriceps, isometric This exercise stretches the muscles in front of your thigh (quadriceps) without moving your knee joint (isometric). 1. Lie on your back with your left / right leg extended and your other knee bent. Put a rolled towel or small pillow under your knee if told by your health care provider. 2. Slowly tense the muscles in the front of your left / right thigh. You should see your kneecap slide up toward your hip or  see increased dimpling just above the knee. This motion will push the back of the knee toward the floor. 3. For __________ seconds, hold the muscle as tight as you can without increasing your pain. 4. Relax the muscles slowly and completely. Repeat __________ times. Complete this exercise __________ times a day. Straight leg raises This exercise stretches the muscles in front of your thigh (quadriceps) and the muscles that move your hips (hip flexors). 1. Lie on your back with  your left / right leg extended and your other knee bent. 2. Tense the muscles in the front of your left / right thigh. You should see your kneecap slide up or see increased dimpling just above the knee. Your thigh may even shake a bit. 3. Keep these muscles tight as you raise your leg 4-6 inches (10-15 cm) off the floor. Do not let your knee bend. 4. Hold this position for __________ seconds. 5. Keep these muscles tense as you lower your leg. 6. Relax your muscles slowly and completely after each repetition. Repeat __________ times. Complete this exercise __________ times a day. Hamstring, isometric 1. Lie on your back on a firm surface. 2. Bend your left / right knee about __________ degrees. 3. Dig your left / right heel into the surface as if you are trying to pull it toward your buttocks. Tighten the muscles in the back of your thighs (hamstring) to "dig" as hard as you can without increasing any pain. 4. Hold this position for __________ seconds. 5. Release the tension gradually and allow your muscles to relax completely for __________ seconds after each repetition. Repeat __________ times. Complete this exercise __________ times a day. Hamstring curls If told by your health care provider, do this exercise while wearing ankle weights. Begin with __________ lb weights. Then increase the weight by 1 lb (0.5 kg) increments. Do not wear ankle weights that are more than __________ lb. 1. Lie on your abdomen with your legs straight. 2. Bend your left / right knee as far as you can without feeling pain. Keep your hips flat against the floor. 3. Hold this position for __________ seconds. 4. Slowly lower your leg to the starting position. Repeat __________ times. Complete this exercise __________ times a day. Squats This exercise strengthens the muscles in front of your thigh and knee (quadriceps). 1. Stand in front of a table, with your feet and knees pointing straight ahead. You may rest your  hands on the table for balance but not for support. 2. Slowly bend your knees and lower your hips like you are going to sit in a chair. ? Keep your weight over your heels, not over your toes. ? Keep your lower legs upright so they are parallel with the table legs. ? Do not let your hips go lower than your knees. ? Do not bend lower than told by your health care provider. ? If your knee pain increases, do not bend as low. 3. Hold the squat position for __________ seconds. 4. Slowly push with your legs to return to standing. Do not use your hands to pull yourself to standing. Repeat __________ times. Complete this exercise __________ times a day. Wall slides This exercise strengthens the muscles in front of your thigh and knee (quadriceps). 1. Lean your back against a smooth wall or door, and walk your feet out 18-24 inches (46-61 cm) from it. 2. Place your feet hip-width apart. 3. Slowly slide down the wall or door until your knees bend __________ degrees.  Keep your knees over your heels, not over your toes. Keep your knees in line with your hips. 4. Hold this position for __________ seconds. Repeat __________ times. Complete this exercise __________ times a day. Straight leg raises This exercise strengthens the muscles that rotate the leg at the hip and move it away from your body (hip abductors). 1. Lie on your side with your left / right leg in the top position. Lie so your head, shoulder, knee, and hip line up. You may bend your bottom knee to help you keep your balance. 2. Roll your hips slightly forward so your hips are stacked directly over each other and your left / right knee is facing forward. 3. Leading with your heel, lift your top leg 4-6 inches (10-15 cm). You should feel the muscles in your outer hip lifting. ? Do not let your foot drift forward. ? Do not let your knee roll toward the ceiling. 4. Hold this position for __________ seconds. 5. Slowly return your leg to the  starting position. 6. Let your muscles relax completely after each repetition. Repeat __________ times. Complete this exercise __________ times a day. Straight leg raises This exercise stretches the muscles that move your hips away from the front of the pelvis (hip extensors). 1. Lie on your abdomen on a firm surface. You can put a pillow under your hips if that is more comfortable. 2. Tense the muscles in your buttocks and lift your left / right leg about 4-6 inches (10-15 cm). Keep your knee straight as you lift your leg. 3. Hold this position for __________ seconds. 4. Slowly lower your leg to the starting position. 5. Let your leg relax completely after each repetition. Repeat __________ times. Complete this exercise __________ times a day. This information is not intended to replace advice given to you by your health care provider. Make sure you discuss any questions you have with your health care provider. Document Revised: 05/26/2018 Document Reviewed: 05/26/2018 Elsevier Patient Education  2020 [employer].

## 2020-04-11 NOTE — Progress Notes (Signed)
  Tommi Rumps, MD Phone: [phone]  William Taylor is a 86 y.o. male who presents today for f/u.  Orthostatic hypotension: Patient has not had any issues with lightheadedness in quite some time.  He has been taking droxidopa and Florinef.  He stopped the midodrine.  He is currently only taking 1 tablet of droxidopa 3 times daily with meals.  His blood pressure has been running into the 741O to 878M systolically.  He notes chest pain a couple months ago though had negative work-up in the ED.  None since then.  Sounds as though this was atypical occurring every 20 seconds with a little jolt in his left chest.  No shortness of breath.  Chronic knee pain: Patient notes at times it is hard to get up from a seated position if he does not have anything to push himself up on.  Occasionally feels as though his right knee will give out on him.  He notes occasional aching in his knees.  No medications for this.  No falls.  No injuries to his knees.  He was seeing orthopedics and did physical therapy which he notes was not beneficial.  Social History   Tobacco Use  Smoking Status Never Smoker  Smokeless Tobacco Never Used     ROS see history of present illness  Objective  Physical Exam Vitals:   04/11/20 1212  BP: (!) 150/92  Pulse: (!) 101  Temp: 97.9 F (36.6 C)  SpO2: 98%    BP Readings from Last 3 Encounters:  04/11/20 (!) 150/92  01/26/20 (!) 148/90  01/14/20 (!) 209/129   Wt Readings from Last 3 Encounters:  04/11/20 213 lb 12.8 oz (97 kg)  01/26/20 212 lb (96.2 kg)  01/13/20 206 lb (93.4 kg)    Physical Exam Constitutional:      General: He is not in acute distress.    Appearance: He is not diaphoretic.  Cardiovascular:     Rate and Rhythm: Normal rate and regular rhythm.     Heart sounds: Normal heart sounds.  Pulmonary:     Effort: Pulmonary effort is normal.     Breath sounds: Normal breath sounds.  Musculoskeletal:     Comments: Bilateral knees no warmth,  swelling, erythema, or tenderness, negative McMurray's bilaterally  Skin:    General: Skin is warm and dry.  Neurological:     Mental Status: He is alert.      Assessment/Plan: Please see individual problem list.  Orthostatic hypotension Seems to have not had any issues with orthostasis anytime recently.  I wonder if he could come off of his droxidopa and Florinef.  We will send a message to his cardiologist to get their input.  We will contact the patient once we hear back from his cardiologist.  Osteoarthritis of knees, bilateral Patient with continued aching in his knees and difficulty moving related to this.  We will have him do exercises at home as physical therapy was not previously beneficial.   No orders of the defined types were placed in this encounter.   No orders of the defined types were placed in this encounter.   This visit occurred during the SARS-CoV-2 public health emergency.  Safety protocols were in place, including screening questions prior to the visit, additional usage of staff PPE, and extensive cleaning of exam room while observing appropriate contact time as indicated for disinfecting solutions.    Tommi Rumps, MD [place]

## 2020-04-11 NOTE — Assessment & Plan Note (Signed)
Seems to have not had any issues with orthostasis anytime recently.  I wonder if he could come off of his droxidopa and Florinef.  We will send a message to his cardiologist to get their input.  We will contact the patient once we hear back from his cardiologist.

## 2020-04-12 ENCOUNTER — Telehealth: Payer: Self-pay | Admitting: *Deleted

## 2020-04-12 NOTE — Telephone Encounter (Signed)
Left voicemail message to call back  

## 2020-04-12 NOTE — Telephone Encounter (Signed)
-----   Message from [name], MD sent at 04/10/2020 12:43 PM EDT ----- Regarding: RE: high BP Pam, can you call the patient We need some orthostatics if they could get that data for [place] Is he having any orthostasis sx? If pressures elevated and no orthostasis, we could decrease the dose of his medications Please take note of which meds he is taking and doses/and times. Thx TG ----- Message ----- From: Murlean Iba, MD Sent: 04/10/2020   9:47 AM EDT To: [name], MD Subject: high BP                                        Hi Tim Saw this patient in office today - He has orthostatic hypotension and you have him on florinef and droxidopa (northera). His BP is now in 160's-170's and he also has some edema today. I asked him to to reach out to your office to discuss d/c or decreasing florinef [place]

## 2020-04-13 NOTE — Telephone Encounter (Signed)
I called and spoke with the patient and informed him that the recommendation of Dr. Rockey Situ is to stop the Floinef first and see how he does, but continue the droxidopa at this time. I also informed him that he needs to keep his f/up with the provider and if his BP continues to run high to call and let us know and he understood.  Jadiel Schmieder,cma

## 2020-04-20 NOTE — Telephone Encounter (Signed)
I attempted to call the patient. No answer- I left a message to please call back.

## 2020-04-28 NOTE — Telephone Encounter (Signed)
Spoke with patients wife and patient. They are currently on vacation at the beach. He does not have a BP cuff with him. He stated he has been feeling good. I scheduled him to come in and see Laurann Montana next week on 9/15 for orthostatic BP evaluation and possible medication adjustment per previous notes.

## 2020-04-28 NOTE — Telephone Encounter (Signed)
No answer. Left message to call back on patient's number and wife's number. This is the 3rd attempt to try to reach the patient. His next appointment is 07/31/20 with Dr Rockey Situ. He sees PCP 05/09/20.

## 2020-04-28 NOTE — Telephone Encounter (Signed)
Patient spouse returning call

## 2020-05-03 ENCOUNTER — Ambulatory Visit: Payer: Medicare HMO | Admitting: Family

## 2020-05-03 ENCOUNTER — Encounter: Payer: Self-pay | Admitting: Family

## 2020-05-03 ENCOUNTER — Other Ambulatory Visit: Payer: Self-pay

## 2020-05-03 VITALS — BP 156/98 | HR 80 | Ht 75.5 in | Wt 212.8 lb

## 2020-05-03 DIAGNOSIS — E782 Mixed hyperlipidemia: Secondary | ICD-10-CM

## 2020-05-03 DIAGNOSIS — Z79899 Other long term (current) drug therapy: Secondary | ICD-10-CM

## 2020-05-03 DIAGNOSIS — I951 Orthostatic hypotension: Secondary | ICD-10-CM

## 2020-05-03 DIAGNOSIS — Z7901 Long term (current) use of anticoagulants: Secondary | ICD-10-CM | POA: Diagnosis not present

## 2020-05-03 DIAGNOSIS — I480 Paroxysmal atrial fibrillation: Secondary | ICD-10-CM

## 2020-05-03 DIAGNOSIS — I10 Essential (primary) hypertension: Secondary | ICD-10-CM

## 2020-05-03 DIAGNOSIS — I25118 Atherosclerotic heart disease of native coronary artery with other forms of angina pectoris: Secondary | ICD-10-CM

## 2020-05-03 NOTE — Patient Instructions (Addendum)
Medication Instructions:  Your physician has recommended you make the following change in your medication:   STOP Droxidopa  *If you need a refill on your cardiac medications before your next appointment, please call your pharmacy*  Lab Work: None ordered today  Testing/Procedures: Your EKG today shows normal sinus rhythm which is a great result!  Follow-Up: At [facility], you and your health needs are our priority.  As part of our continuing mission to provide you with exceptional heart care, we have created designated Provider Care Teams.  These Care Teams include your primary Cardiologist (physician) and Advanced Practice Providers (APPs -  Physician Assistants and Nurse Practitioners) who all work together to provide you with the care you need, when you need it.  We recommend signing up for the patient portal called "MyChart".  Sign up information is provided on this After Visit Summary.  MyChart is used to connect with patients for Virtual Visits (Telemedicine).  Patients are able to view lab/test results, encounter notes, upcoming appointments, etc.  Non-urgent messages can be sent to your provider as well.   To learn more about what you can do with MyChart, go to [email or web address].    Your next appointment:   As previously scheduled with Dr. Rockey Situ   Other Instructions  We will stop your Droxidopa to help get your blood pressure down to a more reasonable number.   If you start noticing low blood pressures (<110/60) or lightheadedness or dizziness please call our office.

## 2020-05-03 NOTE — Progress Notes (Signed)
Office Visit    Patient Name: William Taylor Date of Encounter: 05/03/2020  Primary Care Provider:  [name], MD Primary Cardiologist:  Ida Rogue, MD Electrophysiologist:  None   Chief Complaint    William Taylor is a 86 y.o. male with a hx of  CAD s/p stent 2005 and stent to [place] 12/2010, CKDIII, PE following catheterization started on anticoagulation, PAF, anemia  presents today for follow up of orthostatic hypotension, edema.   Past Medical History    Past Medical History:  Diagnosis Date  . Cancer [facility])    Prostate, followed by Dr. Jacqlyn Larsen  . Colon polyp   . Coronary artery disease   . Gall stones   . Hyperlipidemia   . Hypertension   . MI (myocardial infarction) ([place])    2015  . PAF (paroxysmal atrial fibrillation) ([place])    a. on xarelto  . Skin cancer    Past Surgical History:  Procedure Laterality Date  . CARDIAC CATHETERIZATION  10/2013   [facility]  . CATARACT EXTRACTION  Oct. 3, 2012   right eye  . colonoscopy    . COLONOSCOPY W/ POLYPECTOMY  2015   Dr Rayann Heman  . COLONOSCOPY WITH PROPOFOL N/A 08/15/2016   Procedure: COLONOSCOPY WITH PROPOFOL;  Surgeon: [name], MD;  Location: [facility] ENDOSCOPY;  Service: Endoscopy;  Laterality: N/A;  . CORONARY ANGIOPLASTY  2009   2005; s/p stent  . ENTEROSCOPY N/A 09/22/2018   Procedure: ENTEROSCOPY;  Surgeon: Lin Landsman, MD;  Location: [place];  Service: Gastroenterology;  Laterality: N/A;  . ESOPHAGOGASTRODUODENOSCOPY (EGD) WITH PROPOFOL N/A 08/15/2016   Procedure: ESOPHAGOGASTRODUODENOSCOPY (EGD) WITH PROPOFOL;  Surgeon: [name], MD;  Location: [facility] ENDOSCOPY;  Service: Endoscopy;  Laterality: N/A;  . GIVENS CAPSULE STUDY N/A 09/26/2016   Procedure: GIVENS CAPSULE STUDY;  Surgeon: [name], MD;  Location: [facility] ENDOSCOPY;  Service: Endoscopy;  Laterality: N/A;  . HERNIA REPAIR  2013  . LUMBAR SPINE SURGERY    . UPPER GI ENDOSCOPY  Sept 2015   Dr Rayann Heman    Allergies  Allergies  Allergen  Reactions  . Bee Venom Swelling  . Sulfasalazine Hives and Swelling  . Sulfa Antibiotics Hives and Swelling  . Warfarin And Related Other (See Comments)    Chest pain    History of Present Illness    William Taylor is a 86 y.o. male with a hx of CAD s/p stent 2005 and stent to [place] 12/2010, CKDIII, PE following catheterization started on anticoagulation, PAF, anemia  last seen 01/26/20 by Dr. Rockey Situ.  He had low risk stress test 06/2019. Echo 08/2018 with EF 55-60%, no RWMA, gr1DD, mild LVH, trivial AI, bilateral atria normal in size. Long term monitor 07/2019 with predominantly NSR, 6 runs of SVT, rare PVC/PAC and no recurrent atrial fibrillation. He presented to ED 11/2019 for chest pain which felt like "shart prickling pains". Workup unremarkable.   Received note from nephrology 04/10/20 that his BP was in the 160s-170s and he had some edema. Our office reached out to him regarding symptoms and orthostatic vital signs, but was unable to reach him until 04/28/20.   Presents today for follow up. Home BP readings routine 160s/100s in the morning and 170s/100s in the evening. He is understandably very concerned about his blood pressure.  He has been off midodrine for some time.  01/29/2020 he changed his Florinef to every other day.  On 04/13/2020 he discontinued his Flonase.  He continues on droxidopa 100 mg  3 times per day.  Orthostatic vitals today notably positive with nearly 40 points BP drop from lying to standing.  Orthostatic VS for the past 24 hrs:  BP- Lying Pulse- Lying BP- Sitting Pulse- Sitting BP- Standing at 0 minutes Pulse- Standing at 0 minutes  05/03/20 1323 (!) 181/103 82 (!) 151/94 87 (!) 144/92 93   Reports no chest pain, pressure, tightness.  Reports no shortness of breath at rest.  Endorses dyspnea with only more than usual activity was stable at baseline.  Reports no lightheadedness, dizziness, near-syncope, syncope.  EKGs/Labs/Other Studies Reviewed:   The following studies  were reviewed today: ZIO monitor 06/2019 avg HR of 85 bpm.  Sinus Rhythm.  6 Supraventricular Tachycardia/atrial tachycardia  runs occurred, the run with the fastest interval lasting 7 beats with a max rate of 197 bpm, the longest lasting 18 beats with an avg rate of 126 bpm.    Isolated SVEs were occasional (1.2%, 19914), SVE Couplets were rare (<1.0%, 14), and no SVE Triplets were present. Isolated VEs were rare (<1.0%), VE Couplets were rare (<1.0%), and no VE Triplets were present.   Patient triggered events were not associated with significant arrhythmia.  Echo 08/2018 Left ventricle: The cavity size was normal. There was mild    concentric hypertrophy. Systolic function was normal. The    estimated ejection fraction was in the range of 55% to 60%. Wall    motion was normal; there were no regional wall motion    abnormalities. Doppler parameters are consistent with abnormal    left ventricular relaxation (grade 1 diastolic dysfunction).  - Aortic valve: There was trivial regurgitation.  - Left atrium: The atrium was normal in size.  - Right ventricle: Systolic function was normal.  - Pulmonary arteries: Systolic pressure could not be accurately    estimated.   EKG:  EKG is ordered today.  The ekg ordered today demonstrates normal sinus rhythm 80 bpm with left axis deviation and no acute ST/T wave changes.  Recent Labs: 08/27/2019: ALT 13 01/13/2020: BUN 29; Creatinine, Ser 1.35; Hemoglobin 10.4; Platelets 279; Potassium 3.9; Sodium 139  Recent Lipid Panel    Component Value Date/Time   CHOL 110 06/28/2016 1106   CHOL 119 10/27/2013 0609   TRIG 80.0 06/28/2016 1106   TRIG 61 10/27/2013 0609   HDL 29.90 (L) 06/28/2016 1106   HDL 38 (L) 10/27/2013 0609   CHOLHDL 4 06/28/2016 1106   VLDL 16.0 06/28/2016 1106   VLDL 12 10/27/2013 0609   LDLCALC 64 06/28/2016 1106   LDLCALC 69 10/27/2013 0609    Home Medications   Current Meds  Medication Sig  . amiodarone (PACERONE) 100  MG tablet Take 1 tablet (100 mg total) by mouth daily.  . fenofibrate micronized (ANTARA) 130 MG capsule TAKE 1 CAPSULE BY MOUTH  DAILY BEFORE BREAKFAST  . finasteride (PROSCAR) 5 MG tablet Take 1 tablet (5 mg total) by mouth daily.  Marland Kitchen levobunolol (BETAGAN) 0.5 % ophthalmic solution Place 1 drop into the right eye 2 (two) times daily.   . Melatonin 3 MG CAPS Take 2 capsules by mouth every evening.  . mirtazapine (REMERON) 15 MG tablet TAKE 1 TABLET BY MOUTH NIGHTLY  . Multiple Vitamin (MULTIVITAMIN PO) Take 1 tablet by mouth daily.  . nitroGLYCERIN (NITROSTAT) 0.4 MG SL tablet DISSOLVE UNDER THE TONGUE 1 TABLET EVERY 5 MINUTES AS NEEDED FOR CHEST PAIN  . pantoprazole (PROTONIX) 40 MG tablet TAKE ONE TABLET BY MOUTH TWICE DAILY  . polyethylene glycol (  MIRALAX / GLYCOLAX) 17 g packet Take 17 g by mouth daily as needed.  . pravastatin (PRAVACHOL) 40 MG tablet TAKE 1 TABLET BY MOUTH AT  BEDTIME  . XARELTO 20 MG TABS tablet TAKE 1 TABLET BY MOUTH  DAILY  . [DISCONTINUED] Droxidopa 100 MG CAPS Take by mouth.    Review of Systems    Review of Systems  Constitutional: Negative for chills, fever and malaise/fatigue.  Cardiovascular: Positive for dyspnea on exertion. Negative for chest pain, leg swelling, near-syncope, orthopnea, palpitations and syncope.  Respiratory: Negative for cough, shortness of breath and wheezing.   Gastrointestinal: Negative for nausea and vomiting.  Neurological: Negative for dizziness, light-headedness and weakness.   All other systems reviewed and are otherwise negative except as noted above.  Physical Exam    VS:  Ht 6' 3.5" (1.918 m)   Wt 212 lb 12.8 oz (96.5 kg)   SpO2 97%   BMI 26.25 kg/m  , BMI Body mass index is 26.25 kg/m. GEN: Well nourished, well developed, in no acute distress. HEENT: normal. Neck: Supple, no JVD, carotid bruits, or masses. Cardiac: RRR, no murmurs, rubs, or gallops. No clubbing, cyanosis, edema.  Radials/DP/PT 2+ and equal  bilaterally.  Respiratory:  Respirations regular and unlabored, clear to auscultation bilaterally. GI: Soft, nontender, nondistended, BS + x 4. MS: No deformity or atrophy. Skin: Warm and dry, no rash. Neuro:  Strength and sensation are intact. Psych: Normal affect.  Assessment & Plan    1. Orthostatic hypotension -now with markedly elevated BP sitting routinely 160s-170s/100s at home. He is understandably concerned about his elevated blood pressure. Orthostatic vitals today laying 181/103 (82bpm), sitting 150/94 (87 bpm), standing (zero minutes) 144/92 (93 bpm), standing (3 mins) 159/96 (93 bpm) without symptoms.  Midodrine and Florinef have previously been discontinued.  We will STOP droxidopa 100 mg 3 times daily.  He has follow-up with his primary care provider next week for reassessment of BP.  Today.  Encouraged to call our office with BP <110/60 or lightheadedness/dizziness.  If he has recurrent symptoms of orthostasis may benefit from low-dose midodrine.  2. HLD - Continue Pravastatin 40mg  daily per PCP. CrCl >30, continue Xarelto 20mg  daily.   3. CAD -stable no anginal symptoms.  EKG without acute ST/T wave changes today.  GDMT includes statin.  No metoprolol due to history of orthostatic hypotension, no aspirin secondary to chronic anticoagulation.  No indication for ischemic evaluation at this time.  4. PAF on chronic anticoagulation on Amiodarone -maintaining normal sinus rhythm by EKG today.  Continue amiodarone 100 mg daily, Xarelto 20 mg daily.  Denies bleeding complications. Amiodarone monitoring: normal liver function 08/2019. Due for TSH with next lab collection. No signs of toxicity  5. CKDIII - Follows with Dr. Candiss Norse of nephrology. Per nephrology, recommend 30-40oz of water per day.   6. Anemia - 04/10/20 Hb 11 per Care Everywhere.  Disposition: Follow up in December as previously scheduled with Dr. Rockey Situ or APP.   Loel Dubonnet, NP 05/03/2020, 2:13 PM

## 2020-05-09 ENCOUNTER — Encounter: Payer: Self-pay | Admitting: Family Medicine

## 2020-05-09 ENCOUNTER — Other Ambulatory Visit: Payer: Self-pay

## 2020-05-09 ENCOUNTER — Ambulatory Visit: Payer: Medicare HMO | Admitting: Family Medicine

## 2020-05-09 VITALS — BP 160/100 | HR 83 | Temp 98.0°F | Ht 76.0 in | Wt 212.8 lb

## 2020-05-09 DIAGNOSIS — I951 Orthostatic hypotension: Secondary | ICD-10-CM

## 2020-05-09 DIAGNOSIS — Z23 Encounter for immunization: Secondary | ICD-10-CM | POA: Diagnosis not present

## 2020-05-09 NOTE — Patient Instructions (Signed)
Nice to see you. We will call you when I hear back from cardiology.

## 2020-05-09 NOTE — Assessment & Plan Note (Signed)
Remains orthostatic but his BP is quite elevated.  He is no longer on medication to treat the orthostatic hypotension.  I will send a message to his cardiology team to see about placing him on a low-dose of an antihypertensive to get his blood pressure under little bit better control.  Discussed with the patient that we have to walk a fine line with dropping his BP too low versus having it too elevated.

## 2020-05-09 NOTE — Progress Notes (Signed)
  William Rumps, MD Phone: [phone]  William Taylor is a 86 y.o. male who presents today for f/u.  HYPERTENSION  Disease Monitoring  Home BP Monitoring 159-160s/100 Chest pain- no    Dyspnea- no Medications  Compliance-  Taking no medications. Lightheadedness-  no  Edema- no change to chronic edema This patient has a chronic history of orthostasis.  He was recently on midodrine and droxidopa though those have both been discontinued as his blood pressure has been running elevated.    Social History   Tobacco Use  Smoking Status Never Smoker  Smokeless Tobacco Never Used     ROS see history of present illness  Objective  Physical Exam Vitals:   05/09/20 1131  BP: (!) 160/100  Pulse: 83  Temp: 98 F (36.7 C)  SpO2: 99%   Laying blood pressure 169/96 pulse 80 Sitting blood pressure 147/97 pulse 81 Standing blood pressure 144/90 pulse 93  BP Readings from Last 3 Encounters:  05/09/20 (!) 160/100  05/03/20 (!) 156/98  04/11/20 (!) 150/92   Wt Readings from Last 3 Encounters:  05/09/20 212 lb 12.8 oz (96.5 kg)  05/03/20 212 lb 12.8 oz (96.5 kg)  04/11/20 213 lb 12.8 oz (97 kg)    Physical Exam Constitutional:      General: He is not in acute distress.    Appearance: He is not diaphoretic.  Cardiovascular:     Rate and Rhythm: Normal rate and regular rhythm.     Heart sounds: Normal heart sounds.  Pulmonary:     Effort: Pulmonary effort is normal.     Breath sounds: Normal breath sounds.  Skin:    General: Skin is warm and dry.  Neurological:     Mental Status: He is alert.      Assessment/Plan: Please see individual problem list.  Orthostatic hypotension Remains orthostatic but his BP is quite elevated.  He is no longer on medication to treat the orthostatic hypotension.  I will send a message to his cardiology team to see about placing him on a low-dose of an antihypertensive to get his blood pressure under little bit better control.  Discussed  with the patient that we have to walk a fine line with dropping his BP too low versus having it too elevated.   Orders Placed This Encounter  Procedures  . Flu Vaccine QUAD High Dose(Fluad)    No orders of the defined types were placed in this encounter.   William Taylor was seen today for follow-up.  Diagnoses and all orders for this visit:  Need for immunization against influenza -     Flu Vaccine QUAD High Dose(Fluad)  Orthostatic hypotension     This visit occurred during the SARS-CoV-2 public health emergency.  Safety protocols were in place, including screening questions prior to the visit, additional usage of staff PPE, and extensive cleaning of exam room while observing appropriate contact time as indicated for disinfecting solutions.    William Rumps, MD [place]

## 2020-05-10 ENCOUNTER — Telehealth: Payer: Self-pay | Admitting: Family Medicine

## 2020-05-10 DIAGNOSIS — I10 Essential (primary) hypertension: Secondary | ICD-10-CM

## 2020-05-10 MED ORDER — LISINOPRIL 5 MG PO TABS: 5.0000 mg | ORAL_TABLET | Freq: Every day | ORAL | 3 refills | Status: DC

## 2020-05-10 NOTE — Telephone Encounter (Signed)
Please let the patient know that I heard back from cardiology.  They agree that a low-dose of lisinopril would be reasonable.  I sent lisinopril 5 mg for him to take once daily.  If he notices that his blood pressure is dropping or he is getting lightheaded he should let us know.  There is a small risk of a side effect called angioedema.  This is where the mouth and tongue swell while on lisinopril or similar medicines.  If he notices those things he needs to be evaluated immediately.  He needs lab work and a blood pressure check in 1 week.  Orders placed.

## 2020-05-10 NOTE — Telephone Encounter (Signed)
-----   Message from Loel Dubonnet, NP sent at 05/09/2020  3:22 PM EDT ----- Hi,   His BP is still elevated, goodness. I think a low dose Lisinopril is reasonable. I like the once daily administration so it should be consistent. Plus, he thankfully is very aware of his medications so I anticipate will quickly report any side effects. He doesn't see Dr. Rockey Situ again until December, if we need to get him in sooner let me know and I can help facilitate.   Caitlin   ----- Message ----- From: [name], MD Sent: 05/09/2020  11:44 AM EDT To: Loel Dubonnet, NP  Vito Berger,   I saw William Taylor for follow-up today. His BP remains elevated off the droxidopa. He is still orthostatic on vitals, though has no symptoms. I was thinking of starting a low dose of lisinopril as he has been on that previously to see if we can get his BP down a little bit. I wanted to check with you all prior to sending it in. Please let me know what you think.   Randall Hiss

## 2020-05-10 NOTE — Telephone Encounter (Signed)
I called and spoke with the patient and informed him that the provider is starting him on a 5 mg of Lisinopril and it was sent to the pharmacy,I also informed him that if he has any side effects of mouth or tongue swelling to be evaluated immediatly, and if his BP drops and he feels lightheaded to let us know and he understood.  I also scheduled his lab appt in 7-10 days and his nurse visit for a BP check. William Taylor,cma

## 2020-05-18 ENCOUNTER — Other Ambulatory Visit (INDEPENDENT_AMBULATORY_CARE_PROVIDER_SITE_OTHER): Payer: Medicare HMO

## 2020-05-18 ENCOUNTER — Other Ambulatory Visit: Payer: Self-pay

## 2020-05-18 DIAGNOSIS — I10 Essential (primary) hypertension: Secondary | ICD-10-CM

## 2020-05-18 LAB — BASIC METABOLIC PANEL
BUN: 29 mg/dL — ABNORMAL HIGH (ref 6–23)
CO2: 26 mEq/L (ref 19–32)
Calcium: 9.6 mg/dL (ref 8.4–10.5)
Chloride: 105 mEq/L (ref 96–112)
Creatinine, Ser: 1.42 mg/dL (ref 0.40–1.50)
GFR: 47 mL/min — ABNORMAL LOW (ref >60.00)
Glucose, Bld: 110 mg/dL — ABNORMAL HIGH (ref 70–99)
Potassium: 4.4 mEq/L (ref 3.5–5.1)
Sodium: 139 mEq/L (ref 135–145)

## 2020-05-23 ENCOUNTER — Other Ambulatory Visit: Payer: Self-pay | Admitting: Family Medicine

## 2020-05-23 ENCOUNTER — Other Ambulatory Visit: Payer: Self-pay | Admitting: Cardiovascular Disease

## 2020-05-23 DIAGNOSIS — Z760 Encounter for issue of repeat prescription: Secondary | ICD-10-CM

## 2020-05-24 ENCOUNTER — Other Ambulatory Visit: Payer: Self-pay

## 2020-05-24 ENCOUNTER — Ambulatory Visit (INDEPENDENT_AMBULATORY_CARE_PROVIDER_SITE_OTHER): Payer: Medicare HMO

## 2020-05-24 VITALS — BP 151/84 | HR 91

## 2020-05-24 DIAGNOSIS — I10 Essential (primary) hypertension: Secondary | ICD-10-CM

## 2020-05-24 NOTE — Telephone Encounter (Signed)
Pt's age 86, wt 96.5 kg, SCr 1.42, CrCl 49.08, last ov w/ CW 05/03/20. Per protocol, pts is high risk w/ age >78 and CrCl <50. Will route to [facility] for her recommendation.  Do you want to reduce him down to Xarelto 15 mg once daily since his CrCl has dropped below 50?

## 2020-05-24 NOTE — Progress Notes (Signed)
Patient is here for a BP check due to bp being high at last visit, as per patient.  Currently patients BP is 151/84 and BPM is 91.  Patient has no complaints of headaches, blurry vision, chest pain, arm pain, light headedness, dizziness, and nor jaw pain. Patient also stated that thesde numbers are what he gets at home as well.Please see previous note for order.

## 2020-05-24 NOTE — Telephone Encounter (Signed)
Yes, please. Drop to Xarelto 15mg  once daily.   Please let me know if you would like me to call him/his family with the change.   Best, Loel Dubonnet, NP

## 2020-05-24 NOTE — Telephone Encounter (Signed)
Lmom for pt to call back. 

## 2020-05-25 ENCOUNTER — Telehealth: Payer: Self-pay | Admitting: Internal Medicine

## 2020-05-25 ENCOUNTER — Other Ambulatory Visit: Payer: Self-pay | Admitting: Internal Medicine

## 2020-05-25 DIAGNOSIS — I2510 Atherosclerotic heart disease of native coronary artery without angina pectoris: Secondary | ICD-10-CM

## 2020-05-25 DIAGNOSIS — I10 Essential (primary) hypertension: Secondary | ICD-10-CM

## 2020-05-25 MED ORDER — METOPROLOL TARTRATE 25 MG PO TABS: 12.5000 mg | ORAL_TABLET | Freq: Two times a day (BID) | ORAL | 3 refills | Status: DC

## 2020-05-25 NOTE — Telephone Encounter (Signed)
Patient has been informed.

## 2020-05-25 NOTE — Telephone Encounter (Signed)
Sent lopressor 12.5 2x per day to pharmacy take with lisinopril 5 mg daily  Goal BP <130-140/<80   Inform wife sent total care   Dr. Olivia Mackie McLean-Scocuzza

## 2020-06-26 ENCOUNTER — Other Ambulatory Visit: Payer: Self-pay

## 2020-06-26 ENCOUNTER — Ambulatory Visit: Payer: Medicare HMO | Admitting: Family Medicine

## 2020-06-26 ENCOUNTER — Encounter: Payer: Self-pay | Admitting: Family Medicine

## 2020-06-26 DIAGNOSIS — I10 Essential (primary) hypertension: Secondary | ICD-10-CM

## 2020-06-26 MED ORDER — LISINOPRIL 10 MG PO TABS: 10.0000 mg | ORAL_TABLET | Freq: Every day | ORAL | 1 refills | Status: DC

## 2020-06-26 NOTE — Assessment & Plan Note (Signed)
BP remains mildly elevated.  We will increase his lisinopril to 10 mg once daily.  He will continue metoprolol 12.5 mg twice daily.  He will return in 7 to 10 days for labs and a nurse BP check.  He will monitor for lightheadedness and if that occurs he will let us know.

## 2020-06-26 NOTE — Progress Notes (Signed)
  Tommi Rumps, MD Phone: [phone]  William Taylor is a 86 y.o. male who presents today for follow-up.  Hypertension: Systolic BP has averaged in the 150s.  He has been taking lisinopril 5 mg once daily and metoprolol 12.5 mg twice daily.  He has no longer on medication for orthostasis.  No chest pain or shortness of breath.  No changes to chronic edema.  No lightheadedness.  Social History   Tobacco Use  Smoking Status Never Smoker  Smokeless Tobacco Never Used     ROS see history of present illness  Objective  Physical Exam Vitals:   06/26/20 1059  BP: (!) 160/80  Pulse: 68  Temp: 97.6 F (36.4 C)  SpO2: 99%    BP Readings from Last 3 Encounters:  06/26/20 (!) 160/80  05/24/20 (!) 151/84  05/09/20 (!) 160/100   Wt Readings from Last 3 Encounters:  06/26/20 209 lb 12.8 oz (95.2 kg)  05/09/20 212 lb 12.8 oz (96.5 kg)  05/03/20 212 lb 12.8 oz (96.5 kg)    Physical Exam Constitutional:      General: He is not in acute distress.    Appearance: He is not diaphoretic.  Cardiovascular:     Rate and Rhythm: Normal rate and regular rhythm.     Heart sounds: Normal heart sounds.  Pulmonary:     Effort: Pulmonary effort is normal.     Breath sounds: Normal breath sounds.  Musculoskeletal:     Right lower leg: No edema.     Left lower leg: No edema.  Skin:    General: Skin is warm and dry.  Neurological:     Mental Status: He is alert.      Assessment/Plan: Please see individual problem list.  Problem List Items Addressed This Visit    Hypertension    BP remains mildly elevated.  We will increase his lisinopril to 10 mg once daily.  He will continue metoprolol 12.5 mg twice daily.  He will return in 7 to 10 days for labs and a nurse BP check.  He will monitor for lightheadedness and if that occurs he will let us know.      Relevant Medications   lisinopril (ZESTRIL) 10 MG tablet    Other Visit Diagnoses    Essential hypertension       Relevant  Medications   lisinopril (ZESTRIL) 10 MG tablet   Other Relevant Orders   Basic Metabolic Panel (BMET)      This visit occurred during the SARS-CoV-2 public health emergency.  Safety protocols were in place, including screening questions prior to the visit, additional usage of staff PPE, and extensive cleaning of exam room while observing appropriate contact time as indicated for disinfecting solutions.    Tommi Rumps, MD [place]

## 2020-06-26 NOTE — Patient Instructions (Signed)
Nice to see you. We are going to increase your lisinopril to 10 mg once daily.  You will continue the metoprolol. If you start to have lightheadedness please let us know.

## 2020-07-05 ENCOUNTER — Ambulatory Visit (INDEPENDENT_AMBULATORY_CARE_PROVIDER_SITE_OTHER): Payer: Medicare HMO

## 2020-07-05 ENCOUNTER — Other Ambulatory Visit: Payer: Self-pay

## 2020-07-05 DIAGNOSIS — I10 Essential (primary) hypertension: Secondary | ICD-10-CM

## 2020-07-05 LAB — BASIC METABOLIC PANEL
BUN: 27 mg/dL — ABNORMAL HIGH (ref 6–23)
CO2: 27 mEq/L (ref 19–32)
Calcium: 9.3 mg/dL (ref 8.4–10.5)
Chloride: 105 mEq/L (ref 96–112)
Creatinine, Ser: 1.4 mg/dL (ref 0.40–1.50)
GFR: 44.85 mL/min — ABNORMAL LOW (ref >60.00)
Glucose, Bld: 85 mg/dL (ref 70–99)
Potassium: 4.3 mEq/L (ref 3.5–5.1)
Sodium: 141 mEq/L (ref 135–145)

## 2020-07-05 NOTE — Progress Notes (Signed)
Patient is here for a BP check due to bp being high at last visit, as per patient.  Currently patients BP is 161/77 and BPM is 79.  Patient has no complaints of headaches, blurry vision, chest pain, arm pain, light headedness, dizziness, and nor jaw pain. Please see previous note for order.

## 2020-07-08 ENCOUNTER — Other Ambulatory Visit: Payer: Self-pay | Admitting: Family Medicine

## 2020-07-26 NOTE — Progress Notes (Signed)
Date:  07/31/2020   ID:  William Taylor, DOB 1930-05-29, MRN [id number]  Patient Location:  [place] [address]   Provider location:   [facility], [place] office  PCP:  [name], MD  Cardiologist:  Arvid Right [facility]  Chief Complaint  Patient presents with  . Follow-up    6 month F/U; Meds verbally reviewed with patient.    History of Present Illness:    William Taylor is a 86 y.o. male  past medical history of coronary artery disease, stent in 2005,  atrial fibrillation with RVR in the setting of UTI September 2011,  stent placement May 2012 to the OM 2,  PE following catheterization started on anticoagulation, atrial fibrillation in 2013,  Last atrial fibrillation July 2018  atypical chest pain July 2018 History of anemia Stress test low risk in 06/2019 who presents for follow-up of his coronary disease and atrial fibrillation  Last seen in clinic September 2021 by one of our providers Blood pressure elevated on that visit 417 systolic sitting at rest 408 systolic with supine position down to 144 with standing His droxidopa 100mg   3 times daily was held  BP range at home 120 to 150, sitting at rest Rare orthostasis, "if I jump up" Takes his time  Active, goes shopping,no regular exercise Able to walk around store with cart Sometimes cane for longer distances  Weight in 3/20 was 197 in office Lowest at home was 180 pounds, BP markedly low Today is 213, on lisinopril 10  EKG personally reviewed by myself on todays visit NSR rate 69 bpm, no st or T changes   Recently seen in the emergency room December 15, 2019 for chest pain Was sitting at home when he felt some sharp prickling pains in the left chest, like he was being poked every 6 couple of seconds Symptoms went away while waiting in the lobby to be seen in the emergency room Blood pressure in the emergency room 164/109 BUN 29 creatinine 1.35 hemoglobin  10.4  He continues on Northera 200 3 times daily Midodrine 2.5 mg 3 times daily Florinef 0.1 milligrams daily  isit NSR rate 97, no significant ST-T wave changes  Other past medical history reviewed Normal echo 08/2018  Monitor zio for 2 weeks, results reviewed avg HR of 85 bpm. Predominant underlying rhythm was Sinus Rhythm. 6 Supraventricular Tachycardia runs occurred, the run with the fastest interval lasting 7 beats with a max rate of 197 bpm, the longest lasting 18 beats with an avg rate of 126 bpm. Isolated SVEs were occasional (1.2%, 19914), SVE Couplets were rare (<1.0%, 14), and no SVE Triplets were present. Isolated VEs were rare (<1.0%), VE Couplets were rare (<1.0%), and no VE Triplets were present.   Prior CV studies:   The following studies were reviewed today:   Past Medical History:  Diagnosis Date  . Cancer [facility])    Prostate, followed by Dr. Jacqlyn Larsen  . Colon polyp   . Coronary artery disease   . Gall stones   . Hyperlipidemia   . Hypertension   . MI (myocardial infarction) ([place])    2015  . PAF (paroxysmal atrial fibrillation) ([place])    a. on xarelto  . Skin cancer    Past Surgical History:  Procedure Laterality Date  . CARDIAC CAT[facility]IZATION  [facility]   [facility]  . CATARACT EXTRACTION  Oct. 3, 2012   right eye  . colonoscopy    .  COLONOSCOPY W/ POLYPECTOMY  2015   Dr Rayann Heman  . COLONOSCOPY WITH PROPOFOL N/A 08/15/2016   Procedure: COLONOSCOPY WITH PROPOFOL;  Surgeon: [name], MD;  Location: [facility] ENDOSCOPY;  Service: Endoscopy;  Laterality: N/A;  . CORONARY ANGIOPLASTY  2009   2005; s/p stent  . ENTEROSCOPY N/A 09/22/2018   Procedure: ENTEROSCOPY;  Surgeon: Lin Landsman, MD;  Location: [place];  Service: Gastroenterology;  Laterality: N/A;  . ESOPHAGOGASTRODUODENOSCOPY (EGD) WITH PROPOFOL N/A 08/15/2016   Procedure: ESOPHAGOGASTRODUODENOSCOPY (EGD) WITH PROPOFOL;  Surgeon: [name], MD;  Location: [facility] ENDOSCOPY;  Service: Endoscopy;   Laterality: N/A;  . GIVENS CAPSULE STUDY N/A 09/26/2016   Procedure: GIVENS CAPSULE STUDY;  Surgeon: [name], MD;  Location: [facility] ENDOSCOPY;  Service: Endoscopy;  Laterality: N/A;  . HERNIA REPAIR  2013  . LUMBAR SPINE SURGERY    . UPPER GI ENDOSCOPY  Sept 2015   Dr Rayann Heman     Current Meds  Medication Sig  . amiodarone (PACERONE) 100 MG tablet Take 1 tablet (100 mg total) by mouth daily.  . fenofibrate micronized (ANTARA) 130 MG capsule TAKE 1 CAPSULE BY MOUTH  DAILY BEFORE BREAKFAST  . finasteride (PROSCAR) 5 MG tablet Take 1 tablet (5 mg total) by mouth daily.  Marland Kitchen levobunolol (BETAGAN) 0.5 % ophthalmic solution Place 1 drop into the right eye 2 (two) times daily.  Marland Kitchen lisinopril (ZESTRIL) 10 MG tablet Take 1 tablet (10 mg total) by mouth daily.  . Melatonin 3 MG CAPS Take 2 capsules by mouth every evening.  . metoprolol tartrate (LOPRESSOR) 25 MG tablet Take 0.5 tablets (12.5 mg total) by mouth 2 (two) times daily. Cut in 1/2 for pt hold if BP <100/<60.  Marland Kitchen mirtazapine (REMERON) 15 MG tablet TAKE 1 TABLET BY MOUTH NIGHTLY  . Multiple Vitamin (MULTIVITAMIN PO) Take 1 tablet by mouth daily.  . nitroGLYCERIN (NITROSTAT) 0.4 MG SL tablet DISSOLVE UNDER THE TONGUE 1 TABLET EVERY 5 MINUTES AS NEEDED FOR CHEST PAIN  . pantoprazole (PROTONIX) 40 MG tablet TAKE ONE TABLET BY MOUTH TWICE DAILY  . polyethylene glycol (MIRALAX / GLYCOLAX) 17 g packet Take 17 g by mouth daily as needed.  . pravastatin (PRAVACHOL) 40 MG tablet TAKE 1 TABLET BY MOUTH AT  BEDTIME  . Rivaroxaban (XARELTO) 15 MG TABS tablet Take 1 tablet (15 mg total) by mouth daily.     Allergies:   Bee venom, Sulfasalazine, Sulfa antibiotics, and Warfarin and related   Social History   Tobacco Use  . Smoking status: Never Smoker  . Smokeless tobacco: Never Used  Vaping Use  . Vaping Use: Never used  Substance Use Topics  . Alcohol use: No  . Drug use: No     Family Hx: The patient's family history includes Colon cancer in his  father; Coronary artery disease in his brother; Other (age of onset: 33) in his brother; Stroke in his mother.  ROS:   Please see the history of present illness.    Review of Systems  Constitutional: Negative.   HENT: Negative.   Respiratory: Negative.   Cardiovascular: Negative.   Gastrointestinal: Negative.   Musculoskeletal: Negative.   Neurological: Negative.   Psychiatric/Behavioral: Negative.   All other systems reviewed and are negative.   Labs/Other Tests and Data Reviewed:    Recent Labs: 08/27/2019: ALT 13 01/13/2020: Hemoglobin 10.4; Platelets 279 07/05/2020: BUN 27; Creatinine, Ser 1.40; Potassium 4.3; Sodium 141   Recent Lipid Panel Lab Results  Component Value Date/Time   CHOL 110 06/28/2016  11:06 AM   CHOL 119 10/27/2013 06:09 AM   TRIG 80.0 06/28/2016 11:06 AM   TRIG 61 10/27/2013 06:09 AM   HDL 29.90 (L) 06/28/2016 11:06 AM   HDL 38 (L) 10/27/2013 06:09 AM   CHOLHDL 4 06/28/2016 11:06 AM   LDLCALC 64 06/28/2016 11:06 AM   LDLCALC 69 10/27/2013 06:09 AM    Wt Readings from Last 3 Encounters:  07/31/20 213 lb (96.6 kg)  06/26/20 209 lb 12.8 oz (95.2 kg)  05/09/20 212 lb 12.8 oz (96.5 kg)     Exam:    BP (!) 150/84 (BP Location: Left Arm, Patient Position: Sitting, Cuff Size: Normal)   Pulse 69   Ht 6\' 4"  (1.93 m)   Wt 213 lb (96.6 kg)   SpO2 98%   BMI 25.93 kg/m  Constitutional:  oriented to person, place, and time. No distress.  HENT:  Head: Grossly normal Eyes:  no discharge. No scleral icterus.  Neck: No JVD, no carotid bruits  Cardiovascular: Regular rate and rhythm, no murmurs appreciated Pulmonary/Chest: Clear to auscultation bilaterally, no wheezes or rails Abdominal: Soft.  no distension.  no tenderness.  Musculoskeletal: Normal range of motion Neurological:  normal muscle tone. Coordination normal. No atrophy Skin: Skin warm and dry Psychiatric: normal affect, pleasant   ASSESSMENT & PLAN:    Severe orthostasis Associated with  dramatic weeight loss in 2020 Previously on Northera ,  Florinef,  midodrine  Off meds now BP elevated now , on lisinopril  Mixed hyperlipidemia Cholesterol is at goal on the current lipid regimen. No changes to the medications were made.  Coronary artery disease involving native coronary artery of native heart without angina pectoris stress test no ischemia, 06/2019 Currently with no symptoms of angina. No further workup at this time. Continue current medication regimen.  Atrial fibrillation, unspecified type ([place]) on Xarelto Continue low-dose amiodarone event monitor no significant atrial fibrillation In NSR  Chronic nausea resolved  Depression Stable, good mood today, active Encouraged walking  Chronic kidney disease (CKD), stage III (moderate) Stable numbers  Iron deficiency anemia, unspecified iron deficiency anemia type Chronic hemoglobin in the 10 range Last checked 12/2019 Recheck today, q6 months   Total encounter time more than 25 minutes  Greater than 50% was spent in counseling and coordination of care with the patient  Signed, Ida Rogue, MD  07/31/2020 9:49 AM    [place] Office [address], [place], [place] [address]

## 2020-07-31 ENCOUNTER — Other Ambulatory Visit: Payer: Self-pay

## 2020-07-31 ENCOUNTER — Ambulatory Visit: Payer: Medicare HMO | Admitting: Cardiovascular Disease

## 2020-07-31 ENCOUNTER — Encounter: Payer: Self-pay | Admitting: Cardiovascular Disease

## 2020-07-31 VITALS — BP 150/84 | HR 69 | Ht 76.0 in | Wt 213.0 lb

## 2020-07-31 DIAGNOSIS — Z86711 Personal history of pulmonary embolism: Secondary | ICD-10-CM

## 2020-07-31 DIAGNOSIS — I951 Orthostatic hypotension: Secondary | ICD-10-CM

## 2020-07-31 DIAGNOSIS — I10 Essential (primary) hypertension: Secondary | ICD-10-CM

## 2020-07-31 DIAGNOSIS — I25118 Atherosclerotic heart disease of native coronary artery with other forms of angina pectoris: Secondary | ICD-10-CM | POA: Diagnosis not present

## 2020-07-31 DIAGNOSIS — I480 Paroxysmal atrial fibrillation: Secondary | ICD-10-CM

## 2020-07-31 MED ORDER — NITROGLYCERIN 0.4 MG SL SUBL: SUBLINGUAL_TABLET | SUBLINGUAL | 0 refills | Status: DC

## 2020-07-31 NOTE — Patient Instructions (Addendum)
Medication Instructions:  No changes Nitroglycerin has been sent to your pharmacy for pick up  If you need a refill on your cardiac medications before your next appointment, please call your pharmacy.    Lab work: CBC   If you have labs (blood work) drawn today and your tests are completely normal, you will receive your results only by: Marland Kitchen MyChart Message (if you have MyChart) OR . A paper copy in the mail If you have any lab test that is abnormal or we need to change your treatment, we will call you to review the results.   Testing/Procedures: No new testing needed   Follow-Up: At [facility], you and your health needs are our priority.  As part of our continuing mission to provide you with exceptional heart care, we have created designated Provider Care Teams.  These Care Teams include your primary Cardiologist (physician) and Advanced Practice Providers (APPs -  Physician Assistants and Nurse Practitioners) who all work together to provide you with the care you need, when you need it.  . You will need a follow up appointment in 12 months  . Providers on your designated Care Team:   . Murray Hodgkins, NP . Christell Faith, PA-C . Marrianne Mood, PA-C  Any Other Special Instructions Will Be Listed Below (If Applicable).  COVID-19 Vaccine Information can be found at: ShippingScam.co.uk For questions related to vaccine distribution or appointments, please email [email or web address] .[email or web address] or call 6120376570.

## 2020-08-02 LAB — CBC
Hematocrit: 33.5 % — ABNORMAL LOW (ref 37.5–51.0)
Hemoglobin: 11.1 g/dL — ABNORMAL LOW (ref 13.0–17.7)
MCH: 34 pg — ABNORMAL HIGH (ref 26.6–33.0)
MCHC: 33.1 g/dL (ref 31.5–35.7)
MCV: 103 fL — ABNORMAL HIGH (ref 79–97)
Platelets: 326 10*3/uL (ref 150–450)
RBC: 3.26 x10E6/uL — ABNORMAL LOW (ref 4.14–5.80)
RDW: 15.9 % — ABNORMAL HIGH (ref 11.6–15.4)
WBC: 6.5 10*3/uL (ref 3.4–10.8)

## 2020-08-04 ENCOUNTER — Encounter: Payer: Self-pay | Admitting: Urology

## 2020-08-04 ENCOUNTER — Ambulatory Visit: Payer: Medicare HMO | Admitting: Urology

## 2020-08-04 ENCOUNTER — Other Ambulatory Visit: Payer: Self-pay

## 2020-08-04 VITALS — BP 139/75 | HR 85 | Ht 76.0 in | Wt 213.0 lb

## 2020-08-04 DIAGNOSIS — C61 Malignant neoplasm of prostate: Secondary | ICD-10-CM

## 2020-08-04 DIAGNOSIS — N401 Enlarged prostate with lower urinary tract symptoms: Secondary | ICD-10-CM | POA: Diagnosis not present

## 2020-08-04 MED ORDER — FINASTERIDE 5 MG PO TABS: 5.0000 mg | ORAL_TABLET | Freq: Every day | ORAL | 3 refills | Status: DC

## 2020-08-04 MED ORDER — SILODOSIN 4 MG PO CAPS
4.0000 mg | ORAL_CAPSULE | Freq: Every day | ORAL | 0 refills | Status: DC
Start: 2020-08-04 — End: 2020-08-10

## 2020-08-04 NOTE — Progress Notes (Signed)
08/04/2020 10:51 AM   William Taylor [birth year] [id number]  Referring provider: [name], MD [address] [place],  [place] [address]  Chief Complaint  Patient presents with  . Prostate Cancer      Urologic history: 1. 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 Jacqlyn Larsen; PSA 5.7; 77 g  -focus Gleason 3+3RLB -Elected active surveillance; started finasteride -Baseline PSA 0.1-0.6since 2015  HPI: 86 y.o. male presents for annual follow-up of prostate cancer   Since last years visit he has noted increased daytime frequency and urinary hesitancy and decreased stream at night  Symptoms bothersome enough that he is interested in taking additional medication  Had previously been on tamsulosin without side effects or problems  Remains on finasteride  Denies dysuria, gross hematuria  No flank, abdominal or pelvic pain   PMH: Past Medical History:  Diagnosis Date  . Cancer [facility])    Prostate, followed by Dr. Jacqlyn Larsen  . Colon polyp   . Coronary artery disease   . Gall stones   . Hyperlipidemia   . Hypertension   . MI (myocardial infarction) ([place])    2015  . PAF (paroxysmal atrial fibrillation) ([place])    a. on xarelto  . Skin cancer     Surgical History: Past Surgical History:  Procedure Laterality Date  . CARDIAC CATHETERIZATION  [facility]   [facility]  . CATARACT EXTRACTION  Oct. 3, 2012   right eye  . colonoscopy    . COLONOSCOPY W/ POLYPECTOMY  2015   Dr Rayann Heman  . COLONOSCOPY WITH PROPOFOL N/A 08/15/2016   Procedure: COLONOSCOPY WITH PROPOFOL;  Surgeon: [name], MD;  Location: [facility] ENDOSCOPY;  Service: Endoscopy;  Laterality: N/A;  . CORONARY ANGIOPLASTY  2009   2005; s/p stent  . ENTEROSCOPY N/A 09/22/2018   Procedure: ENTEROSCOPY;  Surgeon: Lin Landsman, MD;  Location: [place];  Service: Gastroenterology;  Laterality: N/A;  . ESOPHAGOGASTRODUODENOSCOPY  (EGD) WITH PROPOFOL N/A 08/15/2016   Procedure: ESOPHAGOGASTRODUODENOSCOPY (EGD) WITH PROPOFOL;  Surgeon: [name], MD;  Location: [facility] ENDOSCOPY;  Service: Endoscopy;  Laterality: N/A;  . GIVENS CAPSULE STUDY N/A 09/26/2016   Procedure: GIVENS CAPSULE STUDY;  Surgeon: [name], MD;  Location: [facility] ENDOSCOPY;  Service: Endoscopy;  Laterality: N/A;  . HERNIA REPAIR  2013  . LUMBAR SPINE SURGERY    . UPPER GI ENDOSCOPY  Sept 2015   Dr Rudean Hitt Medications:  Allergies as of 08/04/2020      Reactions   Bee Venom Swelling   Sulfasalazine Hives, Swelling   Sulfa Antibiotics Hives, Swelling   Warfarin And Related Other (See Comments)   Chest pain      Medication List       Accurate as of August 04, 2020 10:51 AM. If you have any questions, ask your nurse or doctor.        amiodarone 100 MG tablet Commonly known as: PACERONE Take 1 tablet (100 mg total) by mouth daily.   fenofibrate micronized 130 MG capsule Commonly known as: ANTARA TAKE 1 CAPSULE BY MOUTH  DAILY BEFORE BREAKFAST   finasteride 5 MG tablet Commonly known as: PROSCAR Take 1 tablet (5 mg total) by mouth daily.   levobunolol 0.5 % ophthalmic solution Commonly known as: BETAGAN Place 1 drop into the right eye 2 (two) times daily.   lisinopril 10 MG tablet Commonly known as: ZESTRIL Take 1 tablet (10 mg total) by mouth daily.   melatonin 3 MG Tabs  tablet Take by mouth. What changed: Another medication with the same name was removed. Continue taking this medication, and follow the directions you see here. Changed by: Abbie Sons, MD   metoprolol tartrate 25 MG tablet Commonly known as: LOPRESSOR Take 0.5 tablets (12.5 mg total) by mouth 2 (two) times daily. Cut in 1/2 for pt hold if BP <100/<60.   mirtazapine 15 MG tablet Commonly known as: REMERON TAKE 1 TABLET BY MOUTH NIGHTLY   MULTIVITAMIN PO Take 1 tablet by mouth daily.   nitroGLYCERIN 0.4 MG SL tablet Commonly known as:  NITROSTAT DISSOLVE UNDER THE TONGUE 1 TABLET EVERY 5 MINUTES AS NEEDED FOR CHEST PAIN   pantoprazole 40 MG tablet Commonly known as: PROTONIX TAKE ONE TABLET BY MOUTH TWICE DAILY   polyethylene glycol 17 g packet Commonly known as: MIRALAX / GLYCOLAX Take 17 g by mouth daily as needed.   pravastatin 40 MG tablet Commonly known as: PRAVACHOL TAKE 1 TABLET BY MOUTH AT  BEDTIME   Rivaroxaban 15 MG Tabs tablet Commonly known as: Xarelto Take 1 tablet (15 mg total) by mouth daily.       Allergies:  Allergies  Allergen Reactions  . Bee Venom Swelling  . Sulfasalazine Hives and Swelling  . Sulfa Antibiotics Hives and Swelling  . Warfarin And Related Other (See Comments)    Chest pain    Family History: Family History  Problem Relation Age of Onset  . Stroke Mother   . Colon cancer Father   . Coronary artery disease Brother   . Other Brother 18       lymes dz    Social History:  reports that he has never smoked. He has never used smokeless tobacco. He reports that he does not drink alcohol and does not use drugs.   Physical Exam: BP 139/75   Pulse 85   Ht 6\' 4"  (1.93 m)   Wt 213 lb (96.6 kg)   BMI 25.93 kg/m   Constitutional:  Alert and oriented, No acute distress. HEENT: [place] AT, moist mucus membranes.  Trachea midline, no masses. Cardiovascular: No clubbing, cyanosis, or edema. Respiratory: Normal respiratory effort, no increased work of breathing. Neurologic: Grossly intact, no focal deficits, moving all 4 extremities. Psychiatric: Normal mood and affect.   Assessment & Plan:    1.  Prostate cancer  PSA today  2.  BPH with LUTS  Continue finasteride; refill sent  Trial silodosin 4 mg daily  Call back 1 month regarding efficacy   Abbie Sons, [place] [address] [place], [place] [address] [phone]

## 2020-08-05 LAB — PSA: Prostate Specific Ag, Serum: <0.1 ng/mL (ref 0.0–4.0)

## 2020-08-10 ENCOUNTER — Other Ambulatory Visit: Payer: Self-pay | Admitting: Urology

## 2020-08-13 DIAGNOSIS — N401 Enlarged prostate with lower urinary tract symptoms: Secondary | ICD-10-CM | POA: Insufficient documentation

## 2020-08-16 ENCOUNTER — Encounter: Payer: Self-pay | Admitting: *Deleted

## 2020-08-16 ENCOUNTER — Telehealth: Payer: Medicare HMO | Admitting: Family

## 2020-08-16 ENCOUNTER — Other Ambulatory Visit: Payer: Self-pay

## 2020-08-16 ENCOUNTER — Emergency Department: Payer: Medicare HMO

## 2020-08-16 DIAGNOSIS — R079 Chest pain, unspecified: Principal | ICD-10-CM | POA: Insufficient documentation

## 2020-08-16 DIAGNOSIS — I2510 Atherosclerotic heart disease of native coronary artery without angina pectoris: Secondary | ICD-10-CM | POA: Insufficient documentation

## 2020-08-16 DIAGNOSIS — Z20822 Contact with and (suspected) exposure to covid-19: Secondary | ICD-10-CM | POA: Insufficient documentation

## 2020-08-16 DIAGNOSIS — N1831 Chronic kidney disease, stage 3a: Secondary | ICD-10-CM | POA: Insufficient documentation

## 2020-08-16 DIAGNOSIS — Z8546 Personal history of malignant neoplasm of prostate: Secondary | ICD-10-CM | POA: Insufficient documentation

## 2020-08-16 DIAGNOSIS — R748 Abnormal levels of other serum enzymes: Secondary | ICD-10-CM | POA: Insufficient documentation

## 2020-08-16 DIAGNOSIS — I129 Hypertensive chronic kidney disease with stage 1 through stage 4 chronic kidney disease, or unspecified chronic kidney disease: Secondary | ICD-10-CM | POA: Diagnosis not present

## 2020-08-16 DIAGNOSIS — Z7901 Long term (current) use of anticoagulants: Secondary | ICD-10-CM | POA: Insufficient documentation

## 2020-08-16 DIAGNOSIS — Z85828 Personal history of other malignant neoplasm of skin: Secondary | ICD-10-CM | POA: Diagnosis not present

## 2020-08-16 DIAGNOSIS — R059 Cough, unspecified: Secondary | ICD-10-CM | POA: Diagnosis not present

## 2020-08-16 DIAGNOSIS — Z79899 Other long term (current) drug therapy: Secondary | ICD-10-CM | POA: Insufficient documentation

## 2020-08-16 LAB — CBC
HCT: 30.9 % — ABNORMAL LOW (ref 39.0–52.0)
Hemoglobin: 10.8 g/dL — ABNORMAL LOW (ref 13.0–17.0)
MCH: 35.1 pg — ABNORMAL HIGH (ref 26.0–34.0)
MCHC: 35 g/dL (ref 30.0–36.0)
MCV: 100.3 fL — ABNORMAL HIGH (ref 80.0–100.0)
Platelets: 264 10*3/uL (ref 150–400)
RBC: 3.08 MIL/uL — ABNORMAL LOW (ref 4.22–5.81)
RDW: 15 % (ref 11.5–15.5)
WBC: 7.6 10*3/uL (ref 4.0–10.5)
nRBC: 0 % (ref 0.0–0.2)

## 2020-08-16 LAB — BASIC METABOLIC PANEL
Anion gap: 10 (ref 5–15)
BUN: 27 mg/dL — ABNORMAL HIGH (ref 8–23)
CO2: 25 mmol/L (ref 22–32)
Calcium: 8.9 mg/dL (ref 8.9–10.3)
Chloride: 106 mmol/L (ref 98–111)
Creatinine, Ser: 1.35 mg/dL — ABNORMAL HIGH (ref 0.61–1.24)
GFR, Estimated: 50 mL/min — ABNORMAL LOW (ref >60)
Glucose, Bld: 188 mg/dL — ABNORMAL HIGH (ref 70–99)
Potassium: 4 mmol/L (ref 3.5–5.1)
Sodium: 141 mmol/L (ref 135–145)

## 2020-08-16 LAB — TROPONIN I (HIGH SENSITIVITY): Troponin I (High Sensitivity): 8 ng/L (ref <18)

## 2020-08-16 MED ORDER — DOXYCYCLINE HYCLATE 100 MG PO TABS: 100.0000 mg | ORAL_TABLET | Freq: Two times a day (BID) | ORAL | 0 refills | Status: DC

## 2020-08-16 NOTE — ED Triage Notes (Signed)
Pt says that he started with pain in the left arm all day, worse after dinner. When he sits up, feels a heavy pressure in his chest and feels like he is going to pass out. Took 2 nitro prior to EMA arrival without relief. Felt some relief after taking ASA given by EMS. Hx of MI/stent. VSS, NAD in triage.

## 2020-08-16 NOTE — ED Triage Notes (Signed)
EMS brings pt in from home for c/o CP x radiating into left arm, hx MI and card stents

## 2020-08-16 NOTE — Progress Notes (Signed)
We are sorry that you are not feeling well.  Here is how we plan to help!  Based on your presentation I believe you most likely have A cough due to bacteria.  When patients have a fever and a productive cough with a change in color or increased sputum production, we are concerned about bacterial bronchitis.  If left untreated it can progress to pneumonia.  If your symptoms do not improve with your treatment plan it is important that you contact your provider.   I have prescribed Doxycycline 100 mg twice a day for 7 days     In addition you may use A non-prescription cough medication called Robitussin DAC. Take 2 teaspoons every 8 hours or Delsym: take 2 teaspoons every 12 hours., A non-prescription cough medication called Mucinex DM: take 2 tablets every 12 hours. and A prescription cough medication called Tessalon Perles 19m. You may take 1-2 capsules every 8 hours as needed for your cough.    From your responses in the eVisit questionnaire you describe inflammation in the upper respiratory tract which is causing a significant cough.  This is commonly called Bronchitis and has four common causes:    Allergies  Viral Infections  Acid Reflux  Bacterial Infection Allergies, viruses and acid reflux are treated by controlling symptoms or eliminating the cause. An example might be a cough caused by taking certain blood pressure medications. You stop the cough by changing the medication. Another example might be a cough caused by acid reflux. Controlling the reflux helps control the cough.  USE OF BRONCHODILATOR ("RESCUE") INHALERS: There is a risk from using your bronchodilator too frequently.  The risk is that over-reliance on a medication which only relaxes the muscles surrounding the breathing tubes can reduce the effectiveness of medications prescribed to reduce swelling and congestion of the tubes themselves.  Although you feel brief relief from the bronchodilator inhaler, your asthma may  actually be worsening with the tubes becoming more swollen and filled with mucus.  This can delay other crucial treatments, such as oral steroid medications. If you need to use a bronchodilator inhaler daily, several times per day, you should discuss this with your provider.  There are probably better treatments that could be used to keep your asthma under control.     HOME CARE . Only take medications as instructed by your medical team. . Complete the entire course of an antibiotic. . Drink plenty of fluids and get plenty of rest. . Avoid close contacts especially the very young and the elderly . Cover your mouth if you cough or cough into your sleeve. . Always remember to wash your hands . A steam or ultrasonic humidifier can help congestion.   GET HELP RIGHT AWAY IF: . You develop worsening fever. . You become short of breath . You cough up blood. . Your symptoms persist after you have completed your treatment plan MAKE SURE YOU   Understand these instructions.  Will watch your condition.  Will get help right away if you are not doing well or get worse.  Your e-visit answers were reviewed by a board certified advanced clinical practitioner to complete your personal care plan.  Depending on the condition, your plan could have included both over the counter or prescription medications. If there is a problem please reply  once you have received a response from your provider. Your safety is important to [place]  If you have drug allergies check your prescription carefully.    You can use  MyChart to ask questions about today's visit, request a non-urgent call back, or ask for a work or school excuse for 24 hours related to this e-Visit. If it has been greater than 24 hours you will need to follow up with your provider, or enter a new e-Visit to address those concerns. You will get an e-mail in the next two days asking about your experience.  I hope that your e-visit has been valuable and will  speed your recovery. Thank you for using e-visits.  Approximately 5 minutes was spent documenting and reviewing patient's chart.   Given age I will go ahead and treat with antibiotics.

## 2020-08-17 ENCOUNTER — Observation Stay (HOSPITAL_BASED_OUTPATIENT_CLINIC_OR_DEPARTMENT_OTHER): Payer: Medicare HMO

## 2020-08-17 ENCOUNTER — Encounter: Payer: Self-pay | Admitting: Internal Medicine

## 2020-08-17 ENCOUNTER — Other Ambulatory Visit: Payer: Medicare HMO

## 2020-08-17 ENCOUNTER — Observation Stay
Admission: EM | Admit: 2020-08-17 | Discharge: 2020-08-17 | Disposition: A | Discharge status: Home / Self Care | Payer: Medicare HMO | Attending: Internal Medicine | Admitting: Internal Medicine

## 2020-08-17 DIAGNOSIS — C61 Malignant neoplasm of prostate: Secondary | ICD-10-CM | POA: Diagnosis present

## 2020-08-17 DIAGNOSIS — I25118 Atherosclerotic heart disease of native coronary artery with other forms of angina pectoris: Secondary | ICD-10-CM | POA: Diagnosis not present

## 2020-08-17 DIAGNOSIS — R42 Dizziness and giddiness: Secondary | ICD-10-CM | POA: Diagnosis not present

## 2020-08-17 DIAGNOSIS — I2510 Atherosclerotic heart disease of native coronary artery without angina pectoris: Secondary | ICD-10-CM | POA: Diagnosis present

## 2020-08-17 DIAGNOSIS — I10 Essential (primary) hypertension: Secondary | ICD-10-CM | POA: Diagnosis present

## 2020-08-17 DIAGNOSIS — E782 Mixed hyperlipidemia: Secondary | ICD-10-CM | POA: Diagnosis present

## 2020-08-17 DIAGNOSIS — N1831 Chronic kidney disease, stage 3a: Secondary | ICD-10-CM

## 2020-08-17 DIAGNOSIS — K219 Gastro-esophageal reflux disease without esophagitis: Secondary | ICD-10-CM | POA: Diagnosis present

## 2020-08-17 DIAGNOSIS — R778 Other specified abnormalities of plasma proteins: Secondary | ICD-10-CM | POA: Diagnosis present

## 2020-08-17 DIAGNOSIS — I2782 Chronic pulmonary embolism: Secondary | ICD-10-CM

## 2020-08-17 DIAGNOSIS — R0789 Other chest pain: Secondary | ICD-10-CM

## 2020-08-17 DIAGNOSIS — R079 Chest pain, unspecified: Secondary | ICD-10-CM | POA: Diagnosis present

## 2020-08-17 DIAGNOSIS — R7989 Other specified abnormal findings of blood chemistry: Secondary | ICD-10-CM | POA: Diagnosis present

## 2020-08-17 DIAGNOSIS — I480 Paroxysmal atrial fibrillation: Secondary | ICD-10-CM | POA: Diagnosis present

## 2020-08-17 DIAGNOSIS — Z86711 Personal history of pulmonary embolism: Secondary | ICD-10-CM | POA: Diagnosis present

## 2020-08-17 DIAGNOSIS — J029 Acute pharyngitis, unspecified: Secondary | ICD-10-CM

## 2020-08-17 DIAGNOSIS — N183 Chronic kidney disease, stage 3 unspecified: Secondary | ICD-10-CM | POA: Diagnosis present

## 2020-08-17 LAB — NM MYOCAR MULTI W/SPECT W/WALL MOTION / EF
LV dias vol: 56 mL (ref 62–150)
LV sys vol: 16 mL
Peak HR: 93 {beats}/min
Percent HR: 70 %
Rest HR: 75 {beats}/min
SDS: 0
SRS: 9
SSS: 5
TID: 1

## 2020-08-17 LAB — RESP PANEL BY RT-PCR (FLU A&B, COVID) ARPGX2
Influenza A by PCR: NEGATIVE
Influenza B by PCR: NEGATIVE
SARS Coronavirus 2 by RT PCR: NEGATIVE

## 2020-08-17 LAB — TROPONIN I (HIGH SENSITIVITY)
Troponin I (High Sensitivity): 12 ng/L (ref <18)
Troponin I (High Sensitivity): 25 ng/L — ABNORMAL HIGH (ref <18)
Troponin I (High Sensitivity): 35 ng/L — ABNORMAL HIGH (ref <18)

## 2020-08-17 LAB — BRAIN NATRIURETIC PEPTIDE: B Natriuretic Peptide: 41.2 pg/mL (ref 0.0–100.0)

## 2020-08-17 MED ORDER — ALBUTEROL SULFATE HFA 108 (90 BASE) MCG/ACT IN AERS
2.0000 | INHALATION_SPRAY | RESPIRATORY_TRACT | Status: DC | PRN
  Filled 2020-08-17: qty 6.7

## 2020-08-17 MED ORDER — ACETAMINOPHEN 325 MG PO TABS: 650.0000 mg | ORAL_TABLET | Freq: Four times a day (QID) | ORAL | Status: DC | PRN

## 2020-08-17 MED ORDER — DM-GUAIFENESIN ER 30-600 MG PO TB12: 1.0000 | ORAL_TABLET | Freq: Two times a day (BID) | ORAL | 0 refills | Status: DC | PRN

## 2020-08-17 MED ORDER — ONDANSETRON HCL 4 MG/2ML IJ SOLN: 4.0000 mg | Freq: Three times a day (TID) | INTRAMUSCULAR | Status: DC | PRN

## 2020-08-17 MED ORDER — HEPARIN (PORCINE) 25000 UT/250ML-% IV SOLN: 1200.0000 [IU]/h | INTRAVENOUS | Status: DC

## 2020-08-17 MED ORDER — REGADENOSON 0.4 MG/5ML IV SOLN
0.4000 mg | Freq: Once | INTRAVENOUS | Status: AC
  Administered 2020-08-17: 0.4 mg via INTRAVENOUS

## 2020-08-17 MED ORDER — PHENOL 1.4 % MT LIQD
1.0000 | OROMUCOSAL | Status: DC | PRN
  Filled 2020-08-17: qty 177

## 2020-08-17 MED ORDER — HYDRALAZINE HCL 20 MG/ML IJ SOLN: 5.0000 mg | INTRAMUSCULAR | Status: DC | PRN

## 2020-08-17 MED ORDER — MORPHINE SULFATE (PF) 2 MG/ML IV SOLN: 0.5000 mg | INTRAVENOUS | Status: DC | PRN

## 2020-08-17 MED ORDER — SODIUM CHLORIDE 0.9 % IV SOLN: INTRAVENOUS | Status: DC

## 2020-08-17 MED ORDER — TECHNETIUM TC 99M TETROFOSMIN IV KIT
30.2500 | PACK | Freq: Once | INTRAVENOUS | Status: AC | PRN
  Administered 2020-08-17: 30.25 via INTRAVENOUS
  Filled 2020-08-17: qty 31

## 2020-08-17 MED ORDER — NITROGLYCERIN 0.4 MG SL SUBL: 0.4000 mg | SUBLINGUAL_TABLET | SUBLINGUAL | Status: DC | PRN

## 2020-08-17 MED ORDER — TECHNETIUM TC 99M TETROFOSMIN IV KIT
10.0000 | PACK | Freq: Once | INTRAVENOUS | Status: AC | PRN
  Administered 2020-08-17: 10.4 via INTRAVENOUS

## 2020-08-17 MED ORDER — DM-GUAIFENESIN ER 30-600 MG PO TB12: 1.0000 | ORAL_TABLET | Freq: Two times a day (BID) | ORAL | Status: DC | PRN

## 2020-08-17 NOTE — Consult Note (Signed)
Cardiology Consultation:   Patient ID: William Taylor MRN: [id number]; DOB: Jan 29, 1930  Admit date: 08/17/2020 Date of Consult: 08/17/2020  Primary Care Provider: [name], [facility] Cardiologist: William Rogue, Taylor  Physician requesting consult: Dr. Blaine Taylor Reason for consult: Chest pain/angina   Patient Profile:   William Taylor is a 86 y.o. male with a hx of  coronary artery disease, stent in 2005,  atrial fibrillation with RVR in the setting of UTI September 2011,  stent placement May 2012 to the OM 2,  PE following catheterization started on anticoagulation, atrial fibrillation in 2013,  Last atrial fibrillation July 2018  atypical chest pain July 2018 History of anemia Stress test low risk in 06/2019 who presents to the emergency room with chest pain, arm pain on the left  History of Present Illness:   [name] reports having recent cough, bronchitis symptoms Wife is sick, he is developed similar symptoms Reported that he was Covid negative at home Started on doxycycline by primary care, took 1 pill yesterday morning 30 minutes later developed some chest discomfort, radiating to left arm pain below the elbow down to his wrist Symptoms off and on to the day Towards the afternoon, sat up, started to feel lightheaded, Took 2 nitro, symptoms did not improve Family called EMS.  Given 4 aspirin. Symptoms resolved on route to the hospital, in the ER has been symptom-free  EKG unchanged from prior EKGs Nontrending troponins, peak of 25 Currently feels well with no complaints Family at the bedside   Past Medical History:  Diagnosis Date  . Cancer [facility])    Prostate, followed by Dr. Jacqlyn Taylor  . Colon polyp   . Coronary artery disease   . Gall stones   . Hyperlipidemia   . Hypertension   . MI (myocardial infarction) ([place])    2015  . PAF (paroxysmal atrial fibrillation) ([place])    a. on xarelto  . Skin cancer     Past Surgical History:  Procedure  Laterality Date  . CARDIAC CATHETERIZATION  10/2013   [facility]  . CATARACT EXTRACTION  Oct. 3, 2012   right eye  . colonoscopy    . COLONOSCOPY W/ POLYPECTOMY  2015   Dr William Taylor  . COLONOSCOPY WITH PROPOFOL N/A 08/15/2016   Procedure: COLONOSCOPY WITH PROPOFOL;  Surgeon: William Bellows, Taylor;  Location: [facility] ENDOSCOPY;  Service: Endoscopy;  Laterality: N/A;  . CORONARY ANGIOPLASTY  2009   2005; s/p stent  . ENTEROSCOPY N/A 09/22/2018   Procedure: ENTEROSCOPY;  Surgeon: William Landsman, Taylor;  Location: [place];  Service: Gastroenterology;  Laterality: N/A;  . ESOPHAGOGASTRODUODENOSCOPY (EGD) WITH PROPOFOL N/A 08/15/2016   Procedure: ESOPHAGOGASTRODUODENOSCOPY (EGD) WITH PROPOFOL;  Surgeon: William Bellows, Taylor;  Location: [facility] ENDOSCOPY;  Service: Endoscopy;  Laterality: N/A;  . GIVENS CAPSULE STUDY N/A 09/26/2016   Procedure: GIVENS CAPSULE STUDY;  Surgeon: William Bellows, Taylor;  Location: [facility] ENDOSCOPY;  Service: Endoscopy;  Laterality: N/A;  . HERNIA REPAIR  2013  . LUMBAR SPINE SURGERY    . UPPER GI ENDOSCOPY  Sept 2015   Dr William Taylor Medications:  Prior to Admission medications   Medication Sig Start Date End Date Taking? Authorizing Provider  amiodarone (PACERONE) 100 MG tablet Take 1 tablet (100 mg total) by mouth daily. 02/16/20 05/03/20  William Dubonnet, NP  doxycycline (VIBRA-TABS) 100 MG tablet Take 1 tablet (100 mg total) by mouth 2 (two) times daily. 08/16/20   William Balloon, FNP  fenofibrate micronized (ANTARA) 130 MG capsule TAKE 1 CAPSULE BY MOUTH  DAILY BEFORE BREAKFAST 03/24/20   William Haven, Taylor  finasteride (PROSCAR) 5 MG tablet Take 1 tablet (5 mg total) by mouth daily. 08/04/20   Taylor, William Fairly, Taylor  levobunolol (BETAGAN) 0.5 % ophthalmic solution Place 1 drop into the right eye 2 (two) times daily.    Provider, Historical, Taylor  lisinopril (ZESTRIL) 10 MG tablet Take 1 tablet (10 mg total) by mouth daily. 06/26/20   William Haven, Taylor  melatonin 3 MG TABS tablet Take  by mouth. 07/17/20   Provider, Historical, Taylor  metoprolol tartrate (LOPRESSOR) 25 MG tablet Take 0.5 tablets (12.5 mg total) by mouth 2 (two) times daily. Cut in 1/2 for pt hold if BP <100/<60. 05/25/20   Taylor, William Glow, Taylor  mirtazapine (REMERON) 15 MG tablet TAKE 1 TABLET BY MOUTH NIGHTLY 07/10/20   William Haven, Taylor  Multiple Vitamin (MULTIVITAMIN PO) Take 1 tablet by mouth daily.    Provider, Historical, Taylor  nitroGLYCERIN (NITROSTAT) 0.4 MG SL tablet DISSOLVE UNDER THE TONGUE 1 TABLET EVERY 5 MINUTES AS NEEDED FOR CHEST PAIN 07/31/20   William Taylor  pantoprazole (PROTONIX) 40 MG tablet TAKE ONE TABLET BY MOUTH TWICE DAILY 08/25/19   William Bellows, Taylor  polyethylene glycol (MIRALAX / GLYCOLAX) 17 g packet Take 17 g by mouth daily as needed.    Provider, Historical, Taylor  pravastatin (PRAVACHOL) 40 MG tablet TAKE 1 TABLET BY MOUTH AT  BEDTIME 05/24/20   William Haven, Taylor  Rivaroxaban (XARELTO) 15 MG TABS tablet Take 1 tablet (15 mg total) by mouth daily. 05/24/20   William Taylor  silodosin (RAPAFLO) 4 MG CAPS capsule TAKE 1 CAPSULE BY MOUTH EVERY DAY WITH BREAKFAST 08/10/20   William Riis, PA-C    Inpatient Medications: Scheduled Meds:  Continuous Infusions: . sodium chloride 75 mL/hr at 08/17/20 0846  . heparin Stopped (08/17/20 0927)   PRN Meds: acetaminophen, albuterol, dextromethorphan-guaiFENesin, hydrALAZINE, morphine injection, nitroGLYCERIN, ondansetron (ZOFRAN) IV, phenol  Allergies:    Allergies  Allergen Reactions  . Bee Venom Swelling  . Sulfasalazine Hives and Swelling  . Sulfa Antibiotics Hives and Swelling  . Warfarin And Related Other (See Comments)    Chest pain    Social History:   Social History   Socioeconomic History  . Marital status: Married    Spouse name: Not on file  . Number of children: Not on file  . Years of education: Not on file  . Highest education level: Not on file  Occupational History  . Occupation:  retired   Tobacco Use  . Smoking status: Never Smoker  . Smokeless tobacco: Never Used  Vaping Use  . Vaping Use: Never used  Substance and Sexual Activity  . Alcohol use: No  . Drug use: No  . Sexual activity: Not Currently  Other Topics Concern  . Not on file  Social History Narrative  . Not on file   Social Determinants of Health   Financial Resource Strain: Not on file  Food Insecurity: Not on file  Transportation Needs: Not on file  Physical Activity: Not on file  Stress: No Stress Concern Present  . Feeling of Stress : Not at all  Social Connections: Unknown  . Frequency of Communication with Friends and Family: More than three times a week  . Frequency of Social Gatherings with Friends and Family: Once a week  . Attends Religious Services: Not  on file  . Active Member of Clubs or Organizations: Yes  . Attends Banker Meetings: Not on file  . Marital Status: Married  Catering manager Violence: Not At Risk  . Fear of Current or Ex-Partner: No  . Emotionally Abused: No  . Physically Abused: No  . Sexually Abused: No    Family History:    Family History  Problem Relation Age of Onset  . Stroke Mother   . Colon cancer Father   . Coronary artery disease Brother   . Other Brother 36       lymes dz     ROS:  Please see the history of present illness.  Review of Systems  Constitutional: Negative.   HENT: Negative.   Respiratory: Negative.   Cardiovascular: Positive for chest pain.  Gastrointestinal: Negative.   Musculoskeletal: Negative.        Left arm discomfort from the elbow down to the wrist, now resolved  Neurological: Negative.   Psychiatric/Behavioral: Negative.   All other systems reviewed and are negative.   Physical Exam/Data:   Vitals:   08/17/20 0900 08/17/20 0930 08/17/20 1250 08/17/20 1255  BP: (!) 159/83 (!) 152/88    Pulse:   79 80  Resp: 14 13 14 13   Temp:      TempSrc:      SpO2:   96% 96%   No intake or output  data in the 24 hours ending 08/17/20 1311 Last 3 Weights 08/04/2020 07/31/2020 06/26/2020  Weight (lbs) 213 lb 213 lb 209 lb 12.8 oz  Weight (kg) 96.616 kg 96.616 kg 95.165 kg     There is no height or weight on file to calculate BMI.  General:  Well nourished, well developed, in no acute distress HEENT: normal Lymph: no adenopathy Neck: no JVD Endocrine:  No thryomegaly Vascular: No carotid bruits; FA pulses 2+ bilaterally without bruits  Cardiac:  normal S1, S2; RRR; no murmur  Lungs:  clear to auscultation bilaterally, no wheezing, rhonchi or rales  Abd: soft, nontender, no hepatomegaly  Ext: no edema Musculoskeletal:  No deformities, BUE and BLE strength normal and equal Skin: warm and dry  Neuro:  CNs 2-12 intact, no focal abnormalities noted Psych:  Normal affect   EKG:  The EKG was personally reviewed and demonstrates:   Normal sinus rhythm rate 88 bpm consider old inferior MI, otherwise no significant ST-T wave changes  Telemetry:  Telemetry was personally reviewed and demonstrates:   Normal sinus rhythm  Relevant CV Studies:   Laboratory Data:  High Sensitivity Troponin:   Recent Labs  Lab 08/16/20 1958 08/16/20 2315 08/17/20 0415 08/17/20 0844  TROPONINIHS 8 12 25* 35*     Chemistry Recent Labs  Lab 08/16/20 1958  NA 141  K 4.0  CL 106  CO2 25  GLUCOSE 188*  BUN 27*  CREATININE 1.35*  CALCIUM 8.9  GFRNONAA 50*  ANIONGAP 10    No results for input(s): PROT, ALBUMIN, AST, ALT, ALKPHOS, BILITOT in the last 168 hours. Hematology Recent Labs  Lab 08/16/20 1958  WBC 7.6  RBC 3.08*  HGB 10.8*  HCT 30.9*  MCV 100.3*  MCH 35.1*  MCHC 35.0  RDW 15.0  PLT 264   BNP Recent Labs  Lab 08/16/20 1958  BNP 41.2    DDimer No results for input(s): DDIMER in the last 168 hours.   Radiology/Studies:  DG Chest 2 View  Result Date: 08/16/2020 CLINICAL DATA:  Chest pain and left arm pain all  day. EXAM: CHEST - 2 VIEW COMPARISON:  01/13/2020  FINDINGS: Borderline heart size with normal pulmonary vascularity. Mild emphysematous changes in the lungs with scattered fibrosis. No airspace disease or consolidation. No pleural effusions. No pneumothorax. Mediastinal contours appear intact. Calcified and tortuous aorta. Diffuse bone demineralization with degenerative changes in the spine and shoulders. Similar appearance to previous study. IMPRESSION: Emphysematous changes in the lungs. No evidence of active pulmonary disease. Electronically Signed   By: Lucienne Capers M.D.   On: 08/16/2020 20:31     Assessment and Plan:   Chest pain/angina Known coronary artery disease, prior stenting Presentation somewhat atypical, symptoms were at rest, after antibiotic, pain left arm below the elbow to the wrist Troponin is not very impressive peak of 25 EKG unchanged Unable to exclude complication from taking antibiotic pill as symptoms started 30 minutes after doxycycline He is concerned about unstable angina symptoms -Discussed first treatment options with him, An effort to avoid invasive procedure, will order noninvasive testing He is not able to treadmill given debility Pharmacologic Myoview has been ordered He does not want heart catheterization  Atrial fibrillation, paroxysmal On Xarelto Maintaining normal sinus rhythm Continue low-dose amiodarone  Chronic kidney disease Renal function stable Avoid contrast  Anemia Prior history of iron deficiency Stable hemoglobin around 10    Total encounter time more than 110 minutes  Greater than 50% was spent in counseling and coordination of care with the patient   For questions or updates, please contact [facility] Please consult [email or web address] for contact info under    Signed, William Rogue, Taylor  08/17/2020 1:11 PM

## 2020-08-17 NOTE — Progress Notes (Signed)
Pharmacologic Myoview No significant ischemia Normal wall motion, ejection fraction estimated 77% Low risk study  Signed, Dossie Arbour, MD, Ph.D [facility]

## 2020-08-17 NOTE — Discharge Summary (Signed)
Physician Discharge Summary  William Taylor YQI:[id number] DOB: [birth year] DOA: 08/17/2020  PCP: Glori Luis, MD  Admit date: 08/17/2020 Discharge date: 08/17/2020  Recommendations for Outpatient Follow-up:  1. Follow up with PCP in 1 weeks 2. F/u with Dr. Mariah Milling   Home Health: none Equipment/Devices: none  Discharge Condition: stable CODE STATUS: DNR Diet recommendation: heart diet  Brief/Interim Summary (HPI)  William Taylor is a 86 y.o. male with medical history significant of CAD with stent, HTN, HLD, PAF and PE on Xarelto, CKD-III, prostate CA, GIB, anemia, who present with CP.  Patient states that he started having some left arm pain initially yesterday, then central chest pain.  The chest pain is pressure-like, moderate, radiating to left arm. Patient states that he was diaphoretic and felt that he was going to pass out, but did not. Patient took 2 nitroglycerin prior to EMS arrival and had no relief. Patient states that he also took 4 chewable aspirin by EMS and felt increasing relief over the next hour. Pt states that he has been having some dry cough, sore throat recently, PCP prescribed doxycycline.  His chest pain started after he took doxycycline.  No nausea vomiting, diarrhea or abdominal pain no symptoms of UTI.  ED Course: pt was found to have troponin level 12, 25, negative Covid PCR, WBC 7.6, renal function close to baseline, temperature normal, blood pressure 139/104, heart rate 88, RR 20, oxygen saturation 97% on room air.  Chest x-ray showed emphysema change without infiltration.  Patient is placed on progressive bed of observation, Dr. Mariah Milling of cardiology is consulted.   Subjective  -has chest pain, cough, sore throat  Discharge Diagnoses and Hospital Course:   Principal Problem:   Chest pain Active Problems:   Mixed hyperlipidemia   Paroxysmal A-fib (HCC)   Pulmonary embolism (HCC)   CKD (chronic kidney disease), stage IIIa    Hypertension   Prostate cancer (HCC)   Dizziness   Coronary artery disease   GERD (gastroesophageal reflux disease)   Elevated troponin   Sore throat   Principal Problem:   Chest pain Active Problems:   Mixed hyperlipidemia   Paroxysmal A-fib (HCC)   Pulmonary embolism (HCC)   CKD (chronic kidney disease), stage IIIa   Hypertension   Prostate cancer (HCC)   Dizziness   Coronary artery disease   GERD (gastroesophageal reflux disease)   Elevated troponin   Sore throat    Chest pain, hx of CAD and elevated trop: s/p of stetn. Trop 12 -->25 -->35.  Patient states that his chest pain started 30 minutes after taking doxycycline, but I think it is unlikely related. Dr. Mariah Milling is consulted. Myoview stress test which showed no significant ischemic change.  Ejection fraction 77%, low risk.  Per Dr. Mariah Milling, patient can be discharged home today.  -pt is d/c'ed home with follow up with Dr. Mariah Milling - prn Nitroglycerin - Pravastatin, fenofibrate  Mixed hyperlipidemia -Pravastatin, fenofibrate  Paroxysmal A-fib (HCC) -We will continue metoprolol and amiodarone -Xarelto  Pulmonary embolism (HCC) -Xarelto  CKD (chronic kidney disease), stage IIIa: Stable  Hypertension -Continue lisinopril, metoprolol  History of prostate cancer (HCC) -On Proscar  GERD (gastroesophageal reflux disease): -protonix  Sore throat: -continue home doxycycline     Discharge Instructions  Discharge Instructions    Call MD for:  difficulty breathing, headache or visual disturbances   Complete by: As directed    Call MD for:  severe uncontrolled pain   Complete by: As directed  Call MD for:  temperature >100.4   Complete by: As directed    Diet - low sodium heart healthy   Complete by: As directed    Increase activity slowly   Complete by: As directed      Allergies as of 08/17/2020      Reactions   Bee Venom Swelling   Sulfasalazine Hives, Swelling   Sulfa Antibiotics  Hives, Swelling   Warfarin And Related Other (See Comments)   Chest pain      Medication List    TAKE these medications   amiodarone 100 MG tablet Commonly known as: PACERONE Take 1 tablet (100 mg total) by mouth daily.   dextromethorphan-guaiFENesin 30-600 MG 12hr tablet Commonly known as: MUCINEX DM Take 1 tablet by mouth 2 (two) times daily as needed for cough.   doxycycline 100 MG tablet Commonly known as: VIBRA-TABS Take 1 tablet (100 mg total) by mouth 2 (two) times daily.   fenofibrate micronized 130 MG capsule Commonly known as: ANTARA TAKE 1 CAPSULE BY MOUTH  DAILY BEFORE BREAKFAST   finasteride 5 MG tablet Commonly known as: PROSCAR Take 1 tablet (5 mg total) by mouth daily.   levobunolol 0.5 % ophthalmic solution Commonly known as: BETAGAN Place 1 drop into the right eye 2 (two) times daily.   lisinopril 10 MG tablet Commonly known as: ZESTRIL Take 1 tablet (10 mg total) by mouth daily.   melatonin 3 MG Tabs tablet Take by mouth.   metoprolol tartrate 25 MG tablet Commonly known as: LOPRESSOR Take 0.5 tablets (12.5 mg total) by mouth 2 (two) times daily. Cut in 1/2 for pt hold if BP <100/<60.   mirtazapine 15 MG tablet Commonly known as: REMERON TAKE 1 TABLET BY MOUTH NIGHTLY   MULTIVITAMIN PO Take 1 tablet by mouth daily.   nitroGLYCERIN 0.4 MG SL tablet Commonly known as: NITROSTAT DISSOLVE UNDER THE TONGUE 1 TABLET EVERY 5 MINUTES AS NEEDED FOR CHEST PAIN   pantoprazole 40 MG tablet Commonly known as: PROTONIX TAKE ONE TABLET BY MOUTH TWICE DAILY   polyethylene glycol 17 g packet Commonly known as: MIRALAX / GLYCOLAX Take 17 g by mouth daily as needed.   pravastatin 40 MG tablet Commonly known as: PRAVACHOL TAKE 1 TABLET BY MOUTH AT  BEDTIME   Rivaroxaban 15 MG Tabs tablet Commonly known as: Xarelto Take 1 tablet (15 mg total) by mouth daily.   silodosin 4 MG Caps capsule Commonly known as: RAPAFLO TAKE 1 CAPSULE BY MOUTH EVERY DAY  WITH BREAKFAST       Follow-up Information    [name], MD Follow up in 1 week(s).   Specialty: Family Medicine Contact information: [address] [place] [address] [phone]        [name], MD .   Specialty: Cardiology Contact information: [address] [place] [address] [phone]              Allergies  Allergen Reactions  . Bee Venom Swelling  . Sulfasalazine Hives and Swelling  . Sulfa Antibiotics Hives and Swelling  . Warfarin And Related Other (See Comments)    Chest pain    Consultations:  Cardiology   Procedures/Studies: DG Chest 2 View  Result Date: 08/16/2020 CLINICAL DATA:  Chest pain and left arm pain all day. EXAM: CHEST - 2 VIEW COMPARISON:  01/13/2020 FINDINGS: Borderline heart size with normal pulmonary vascularity. Mild emphysematous changes in the lungs with scattered fibrosis. No airspace disease  or consolidation. No pleural effusions. No pneumothorax. Mediastinal contours appear intact. Calcified and tortuous aorta. Diffuse bone demineralization with degenerative changes in the spine and shoulders. Similar appearance to previous study. IMPRESSION: Emphysematous changes in the lungs. No evidence of active pulmonary disease. Electronically Signed   By: Lucienne Capers M.D.   On: 08/16/2020 20:31   NM Myocar Multi W/Spect W/Wall Motion / EF  Result Date: 08/17/2020 Pharmacological myocardial perfusion imaging study with no significant  ischemia Normal wall motion, EF estimated at 77% No EKG changes concerning for ischemia at peak stress or in recovery. Low risk scan Signed, Esmond Plants, MD, Ph.D [facility]      Discharge Exam: Vitals:   08/17/20 1250 08/17/20 1255  BP:    Pulse: 79 80  Resp: 14 13  Temp:    SpO2: 96% 96%   Vitals:   08/17/20 0900 08/17/20 0930 08/17/20 1250 08/17/20 1255  BP: (!) 159/83 (!) 152/88    Pulse:   79 80  Resp: 14 13 14 13   Temp:       TempSrc:      SpO2:   96% 96%    General: Pt is alert, awake, not in acute distress Cardiovascular: RRR, S1/S2 +, no rubs, no gallops Respiratory: CTA bilaterally, no wheezing, no rhonchi Abdominal: Soft, NT, ND, bowel sounds + Extremities: no edema, no cyanosis    The results of significant diagnostics from this hospitalization (including imaging, microbiology, ancillary and laboratory) are listed below for reference.     Microbiology: Recent Results (from the past 240 hour(s))  Resp Panel by RT-PCR (Flu A&B, Covid) Nasopharyngeal Swab     Status: None   Collection Time: 08/17/20  5:12 AM   Specimen: Nasopharyngeal Swab; Nasopharyngeal(NP) swabs in vial transport medium  Result Value Ref Range Status   SARS Coronavirus 2 by RT PCR NEGATIVE NEGATIVE Final    Comment: (NOTE) SARS-CoV-2 target nucleic acids are NOT DETECTED.  The SARS-CoV-2 RNA is generally detectable in upper respiratory specimens during the acute phase of infection. The lowest concentration of SARS-CoV-2 viral copies this assay can detect is 138 copies/mL. A negative result does not preclude SARS-Cov-2 infection and should not be used as the sole basis for treatment or other patient management decisions. A negative result may occur with  improper specimen collection/handling, submission of specimen other than nasopharyngeal swab, presence of viral mutation(s) within the areas targeted by this assay, and inadequate number of viral copies(<138 copies/mL). A negative result must be combined with clinical observations, patient history, and epidemiological information. The expected result is Negative.  Fact Sheet for Patients:  EntrepreneurPulse.com.au  Fact Sheet for Healthcare Providers:  IncredibleEmployment.be  This test is no t yet approved or cleared by the [place] FDA and  has been authorized for detection and/or diagnosis of SARS-CoV-2 by FDA under an  Emergency Use Authorization (EUA). This EUA will remain  in effect (meaning this test can be used) for the duration of the COVID-19 declaration under Section 564(b)(1) of the Act, 21 U.S.C.section 360bbb-3(b)(1), unless the authorization is terminated  or revoked sooner.       Influenza A by PCR NEGATIVE NEGATIVE Final   Influenza B by PCR NEGATIVE NEGATIVE Final    Comment: (NOTE) The Xpert Xpress SARS-CoV-2/FLU/RSV plus assay is intended as an aid in the diagnosis of influenza from Nasopharyngeal swab specimens and should not be used as a sole basis for treatment. Nasal washings and aspirates are unacceptable for Xpert Xpress SARS-CoV-2/FLU/RSV testing.  Fact Sheet for Patients: EntrepreneurPulse.com.au  Fact Sheet for Healthcare Providers: IncredibleEmployment.be  This test is not yet approved or cleared by the [place] FDA and has been authorized for detection and/or diagnosis of SARS-CoV-2 by FDA under an Emergency Use Authorization (EUA). This EUA will remain in effect (meaning this test can be used) for the duration of the COVID-19 declaration under Section 564(b)(1) of the Act, 21 U.S.C. section 360bbb-3(b)(1), unless the authorization is terminated or revoked.  Performed at [facility], [address] [place], [place] [address]      Labs: BNP (last 3 results) Recent Labs    08/16/20 1958  BNP [id number]   Basic Metabolic Panel: Recent Labs  Lab 08/16/20 1958  NA 141  K 4.0  CL 106  CO2 25  GLUCOSE 188*  BUN 27*  CREATININE 1.35*  CALCIUM 8.9   Liver Function Tests: No results for input(s): AST, ALT, ALKPHOS, BILITOT, PROT, ALBUMIN in the last 168 hours. No results for input(s): LIPASE, AMYLASE in the last 168 hours. No results for input(s): AMMONIA in the last 168 hours. CBC: Recent Labs  Lab 08/16/20 1958  WBC 7.6  HGB 10.8*  HCT 30.9*  MCV 100.3*  PLT 264   Cardiac Enzymes: No results for  input(s): CKTOTAL, CKMB, CKMBINDEX, TROPONINI in the last 168 hours. BNP: Invalid input(s): POCBNP CBG: No results for input(s): GLUCAP in the last 168 hours. D-Dimer No results for input(s): DDIMER in the last 72 hours. Hgb A1c No results for input(s): HGBA1C in the last 72 hours. Lipid Profile No results for input(s): CHOL, HDL, LDLCALC, TRIG, CHOLHDL, LDLDIRECT in the last 72 hours. Thyroid function studies No results for input(s): TSH, T4TOTAL, T3FREE, THYROIDAB in the last 72 hours.  Invalid input(s): FREET3 Anemia work up No results for input(s): VITAMINB12, FOLATE, FERRITIN, TIBC, IRON, RETICCTPCT in the last 72 hours. Urinalysis    Component Value Date/Time   COLORURINE YELLOW (A) 12/15/2018 1506   APPEARANCEUR CLEAR (A) 12/15/2018 1506   APPEARANCEUR Hazy 02/27/2014 1335   LABSPEC 1.010 12/15/2018 1506   LABSPEC 1.017 02/27/2014 1335   PHURINE 7.0 12/15/2018 1506   GLUCOSEU NEGATIVE 12/15/2018 1506   GLUCOSEU Negative 02/27/2014 1335   HGBUR NEGATIVE 12/15/2018 1506   BILIRUBINUR NEGATIVE 12/15/2018 1506   BILIRUBINUR neg 03/01/2014 1334   BILIRUBINUR Negative 02/27/2014 1335   KETONESUR NEGATIVE 12/15/2018 1506   PROTEINUR NEGATIVE 12/15/2018 1506   UROBILINOGEN 0.2 03/01/2014 1334   NITRITE NEGATIVE 12/15/2018 1506   LEUKOCYTESUR NEGATIVE 12/15/2018 1506   LEUKOCYTESUR Negative 02/27/2014 1335   Sepsis Labs Invalid input(s): PROCALCITONIN,  WBC,  LACTICIDVEN Microbiology Recent Results (from the past 240 hour(s))  Resp Panel by RT-PCR (Flu A&B, Covid) Nasopharyngeal Swab     Status: None   Collection Time: 08/17/20  5:12 AM   Specimen: Nasopharyngeal Swab; Nasopharyngeal(NP) swabs in vial transport medium  Result Value Ref Range Status   SARS Coronavirus 2 by RT PCR NEGATIVE NEGATIVE Final    Comment: (NOTE) SARS-CoV-2 target nucleic acids are NOT DETECTED.  The SARS-CoV-2 RNA is generally detectable in upper respiratory specimens during the acute phase  of infection. The lowest concentration of SARS-CoV-2 viral copies this assay can detect is 138 copies/mL. A negative result does not preclude SARS-Cov-2 infection and should not be used as the sole basis for treatment or other patient management decisions. A negative result may occur with  improper specimen collection/handling, submission of specimen other than nasopharyngeal swab, presence of viral mutation(s) within  the areas targeted by this assay, and inadequate number of viral copies(<138 copies/mL). A negative result must be combined with clinical observations, patient history, and epidemiological information. The expected result is Negative.  Fact Sheet for Patients:  EntrepreneurPulse.com.au  Fact Sheet for Healthcare Providers:  IncredibleEmployment.be  This test is no t yet approved or cleared by the [place] FDA and  has been authorized for detection and/or diagnosis of SARS-CoV-2 by FDA under an Emergency Use Authorization (EUA). This EUA will remain  in effect (meaning this test can be used) for the duration of the COVID-19 declaration under Section 564(b)(1) of the Act, 21 U.S.C.section 360bbb-3(b)(1), unless the authorization is terminated  or revoked sooner.       Influenza A by PCR NEGATIVE NEGATIVE Final   Influenza B by PCR NEGATIVE NEGATIVE Final    Comment: (NOTE) The Xpert Xpress SARS-CoV-2/FLU/RSV plus assay is intended as an aid in the diagnosis of influenza from Nasopharyngeal swab specimens and should not be used as a sole basis for treatment. Nasal washings and aspirates are unacceptable for Xpert Xpress SARS-CoV-2/FLU/RSV testing.  Fact Sheet for Patients: EntrepreneurPulse.com.au  Fact Sheet for Healthcare Providers: IncredibleEmployment.be  This test is not yet approved or cleared by the [place] FDA and has been authorized for detection and/or diagnosis of  SARS-CoV-2 by FDA under an Emergency Use Authorization (EUA). This EUA will remain in effect (meaning this test can be used) for the duration of the COVID-19 declaration under Section 564(b)(1) of the Act, 21 U.S.C. section 360bbb-3(b)(1), unless the authorization is terminated or revoked.  Performed at [facility], [address] [place], [place] [address]     Time coordinating discharge:  35 minutes.  SIGNED:  [name] Costa, MD [facility] 08/17/2020, 2:08 PM   If 7PM-7AM, please contact night-coverage [email or web address]

## 2020-08-17 NOTE — ED Notes (Signed)
Pt repositioned in bed at this time. Warm blanket given

## 2020-08-17 NOTE — Consult Note (Signed)
ANTICOAGULATION CONSULT NOTE - Initial Consult  Pharmacy Consult for Heparin infusion Indication: chest pain/ACS  Allergies  Allergen Reactions   Bee Venom Swelling   Sulfasalazine Hives and Swelling   Sulfa Antibiotics Hives and Swelling   Warfarin And Related Other (See Comments)    Chest pain    Patient Measurements:   Heparin Dosing Weight: 96.6 kg  Vital Signs: Temp: 97.6 F (36.4 C) (12/30 0116) Temp Source: Oral (12/30 0116) BP: 145/80 (12/30 0830) Pulse Rate: 78 (12/30 0600)  Labs: Recent Labs    08/16/20 1958 08/16/20 2315 08/17/20 0415  HGB 10.8*  --   --   HCT 30.9*  --   --   PLT 264  --   --   CREATININE 1.35*  --   --   TROPONINIHS 8 12 25*    Estimated Creatinine Clearance: 46.4 mL/min (A) (by C-G formula based on SCr of 1.35 mg/dL (H)).   Medical History: Past Medical History:  Diagnosis Date   Cancer [facility])    Prostate, followed by Dr. Achilles Dunk   Colon polyp    Coronary artery disease    Gall stones    Hyperlipidemia    Hypertension    MI (myocardial infarction) (HCC)    2015   PAF (paroxysmal atrial fibrillation) (HCC)    a. on xarelto   Skin cancer     Medications:  Xarelto 15 mg daily PTA med, Unsure of when last taken. Will assume 12/29 @ 1800   Assessment:  86 y.o. male with a stated past medical history of atrial fibrillation on xarelto 15 mg daily prior to admission who presented with chest pain. Pharmacy consulted for heparin infusion for ACS/chest pain  Goal of Therapy:  Heparin level 0.3-0.7 units/ml aPTT 66-102 seconds Monitor platelets by anticoagulation protocol: Yes   Plan:   Start heparin infusion at 1200 units/hr with no bolus give prior anticoagulation   Check baseline heparin and aPTT levels given prior xarelto  Will check aPPT level in 8 hours and daily while on heparin  Continue to monitor H&H and platelets  Sharen Hones, PharmD, BCPS Clinical Pharmacist  08/17/2020,8:59 AM

## 2020-08-17 NOTE — ED Notes (Signed)
Pt being moved to CPOD 15. Will call nuclear med and inform of room change. Daughter moved to room 32 to wait for pt.

## 2020-08-17 NOTE — ED Provider Notes (Signed)
[facility] Emergency Department Provider Note   ____________________________________________   Event Date/Time   First MD Initiated Contact with Patient 08/17/20 [phone]     (approximate)  I have reviewed the triage vital signs and the nursing notes.   HISTORY  Chief Complaint No chief complaint on file.    HPI William Taylor is a 86 y.o. male with a stated past medical history of prostate cancer, gallstones, hyperlipidemia, hypertension, and CAD with stent placed in 2015 who presents for left arm pain that has been present all day.  Patient also endorses associated central chest pain that began after taking a first dose of doxycycline.  Patient describes central chest pressure and worsening left arm pain.  Patient states that he he also became diaphoretic and felt that he was going to pass out.  Patient took 2 nitroglycerin prior to EMS arrival and had no relief.  Patient states that he also took 4 chewable aspirin by EMS and felt increasing relief over the next hour.  Patient is pain-free at this time and has no other complaints.  Patient currently denies any vision changes, tinnitus, difficulty speaking, facial droop, sore throat, chest pain, shortness of breath, abdominal pain, nausea/vomiting/diarrhea, dysuria, or weakness/numbness/paresthesias in any extremity         Past Medical History:  Diagnosis Date   Cancer [facility])    Prostate, followed by Dr. Jacqlyn Larsen   Colon polyp    Coronary artery disease    Gall stones    Hyperlipidemia    Hypertension    MI (myocardial infarction) ([place])    2015   PAF (paroxysmal atrial fibrillation) ([place])    a. 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   Osteoarthritis of knees, bilateral 08/31/2019   Depression 01/11/2019    Dizziness 12/15/2018   History of GI bleed    Malnutrition of moderate degree 09/21/2018   Orthostatic hypotension 06/06/2018   Chronic pain of left knee 06/05/2018   Chronic pain in testicle 06/05/2018   Chronic back pain 02/17/2018   Chest pain 03/07/2017   Angiodysplasia of intestinal tract    Iron deficiency anemia    Benign neoplasm of ascending colon    Diverticulosis of large intestine without diverticulitis    Gastric polyp    Left rotator cuff tear arthropathy 09/11/2015   Fatigue 04/15/2014   Abdominal pain 01/18/2014   Prostate cancer ([place]) 03/15/2013   DOE (dyspnea on exertion) 11/23/2012   Insomnia 09/03/2012   Hypertension 07/23/2012   CKD (chronic kidney disease), stage III ([place]) 07/23/2011   Pulmonary embolism ([place]) 01/09/2011   Paroxysmal A-fib ([place]) 09/06/2010   Mixed hyperlipidemia 05/19/2009   Coronary artery disease of native artery of native heart with stable angina pectoris ([place]) 05/19/2009    Past Surgical History:  Procedure Laterality Date   CARDIAC CATHETERIZATION  10/2013   [facility]   CATARACT EXTRACTION  Oct. 3, 2012   right eye   colonoscopy     COLONOSCOPY W/ POLYPECTOMY  2015   Dr Rayann Heman   COLONOSCOPY WITH PROPOFOL N/A 08/15/2016   Procedure: COLONOSCOPY WITH PROPOFOL;  Surgeon: [name], MD;  Location: [facility] ENDOSCOPY;  Service: Endoscopy;  Laterality: N/A;   CORONARY ANGIOPLASTY  2009   2005; s/p stent   ENTEROSCOPY N/A 09/22/2018   Procedure:  ENTEROSCOPY;  Surgeon: Lin Landsman, MD;  Location[facility] ENDOSCOPY;  Service: Gastroenterology;  Laterality: N/A;   ESOPHAGOGASTRODUODENOSCOPY (EGD) WITH PROPOFOL N/A 08/15/2016   Procedure: ESOPHAGOGASTRODUODENOSCOPY (EGD) WITH PROPOFOL;  Surgeon: [name], MD;  Location: [facility] ENDOSCOPY;  Service: Endoscopy;  Laterality: N/A;   GIVENS CAPSULE STUDY N/A 09/26/2016   Procedure: GIVENS CAPSULE STUDY;  Surgeon: [name], MD;  Location: [facility] ENDOSCOPY;  Service:  Endoscopy;  Laterality: N/A;   HERNIA REPAIR  2013   LUMBAR SPINE SURGERY     UPPER GI ENDOSCOPY  Sept 2015   Dr Rayann Heman    Prior to Admission medications   Medication Sig Start Date End Date Taking? Authorizing Provider  amiodarone (PACERONE) 100 MG tablet Take 1 tablet (100 mg total) by mouth daily. 02/16/20 05/03/20  Loel Dubonnet, NP  doxycycline (VIBRA-TABS) 100 MG tablet Take 1 tablet (100 mg total) by mouth 2 (two) times daily. 08/16/20   Sharion Balloon, FNP  fenofibrate micronized (ANTARA) 130 MG capsule TAKE 1 CAPSULE BY MOUTH  DAILY BEFORE BREAKFAST 03/24/20   [name], MD  finasteride (PROSCAR) 5 MG tablet Take 1 tablet (5 mg total) by mouth daily. 08/04/20   Stoioff, Ronda Fairly, MD  levobunolol (BETAGAN) 0.5 % ophthalmic solution Place 1 drop into the right eye 2 (two) times daily.    [provider]  lisinopril (ZESTRIL) 10 MG tablet Take 1 tablet (10 mg total) by mouth daily. 06/26/20   [name], MD  melatonin 3 MG TABS tablet Take by mouth. 07/17/20   [provider]  metoprolol tartrate (LOPRESSOR) 25 MG tablet Take 0.5 tablets (12.5 mg total) by mouth 2 (two) times daily. Cut in 1/2 for pt hold if BP <100/<60. 05/25/20   McLean-Scocuzza, Nino Glow, MD  mirtazapine (REMERON) 15 MG tablet TAKE 1 TABLET BY MOUTH NIGHTLY 07/10/20   [name], MD  Multiple Vitamin (MULTIVITAMIN PO) Take 1 tablet by mouth daily.    [provider]  nitroGLYCERIN (NITROSTAT) 0.4 MG SL tablet DISSOLVE UNDER THE TONGUE 1 TABLET EVERY 5 MINUTES AS NEEDED FOR CHEST PAIN 07/31/20   [name], MD  pantoprazole (PROTONIX) 40 MG tablet TAKE ONE TABLET BY MOUTH TWICE DAILY 08/25/19   [name], MD  polyethylene glycol (MIRALAX / GLYCOLAX) 17 g packet Take 17 g by mouth daily as needed.    [provider]  pravastatin (PRAVACHOL) 40 MG tablet TAKE 1 TABLET BY MOUTH AT  BEDTIME 05/24/20   [name], MD  Rivaroxaban (XARELTO) 15 MG TABS  tablet Take 1 tablet (15 mg total) by mouth daily. 05/24/20   [name], MD  silodosin (RAPAFLO) 4 MG CAPS capsule TAKE 1 CAPSULE BY MOUTH EVERY DAY WITH BREAKFAST 08/10/20   McGowan, Larene Beach A, PA-C    Allergies Bee venom, Sulfasalazine, Sulfa antibiotics, and Warfarin and related  Family History  Problem Relation Age of Onset   Stroke Mother    Colon cancer Father    Coronary artery disease Brother    Other Brother 43       lymes dz    Social History Social History   Tobacco Use   Smoking status: Never Smoker   Smokeless tobacco: Never Used  Scientific laboratory technician Use: Never used  Substance Use Topics   Alcohol use: No   Drug use: No    Review of Systems Constitutional: No fever/chills Eyes: No visual changes. ENT: No sore throat. Cardiovascular: Endorses chest  pain. Respiratory: Denies shortness of breath. Gastrointestinal: No abdominal pain.  No nausea, no vomiting.  No diarrhea. Genitourinary: Negative for dysuria. Musculoskeletal: Negative for acute arthralgias Skin: Negative for rash. Neurological: Negative for headaches, weakness/numbness/paresthesias in any extremity Psychiatric: Negative for suicidal ideation/homicidal ideation   ____________________________________________   PHYSICAL EXAM:  VITAL SIGNS: ED Triage Vitals  Enc Vitals Group     BP 08/16/20 1954 (!) 146/84     Pulse Rate 08/16/20 1954 88     Resp 08/16/20 1954 16     Temp 08/16/20 1954 98.1 F (36.7 C)     Temp Source 08/17/20 0116 Oral     SpO2 08/16/20 1950 96 %     Weight --      Height --      Head Circumference --      Peak Flow --      Pain Score 08/16/20 1952 3     Pain Loc --      Pain Edu? --      Excl. in [place]? --    Constitutional: Alert and oriented. Well appearing and in no acute distress. Eyes: Conjunctivae are normal. PERRL. Head: Atraumatic. Nose: No congestion/rhinnorhea. Mouth/Throat: Mucous membranes are moist. Neck: No  stridor Cardiovascular: Grossly normal heart sounds.  Good peripheral circulation. Respiratory: Normal respiratory effort.  No retractions. Gastrointestinal: Soft and nontender. No distention. Musculoskeletal: No obvious deformities Neurologic:  Normal speech and language. No gross focal neurologic deficits are appreciated. Skin:  Skin is warm and dry. No rash noted. Psychiatric: Mood and affect are normal. Speech and behavior are normal.  ____________________________________________   LABS (all labs ordered are listed, but only abnormal results are displayed)  Labs Reviewed  BASIC METABOLIC PANEL - Abnormal; Notable for the following components:      Result Value   Glucose, Bld 188 (*)    BUN 27 (*)    Creatinine, Ser 1.35 (*)    GFR, Estimated 50 (*)    All other components within normal limits  CBC - Abnormal; Notable for the following components:   RBC 3.08 (*)    Hemoglobin 10.8 (*)    HCT 30.9 (*)    MCV 100.3 (*)    MCH 35.1 (*)    All other components within normal limits  TROPONIN I (HIGH SENSITIVITY) - Abnormal; Notable for the following components:   Troponin I (High Sensitivity) 25 (*)    All other components within normal limits  RESP PANEL BY RT-PCR (FLU A&B, COVID) ARPGX2  TROPONIN I (HIGH SENSITIVITY)  TROPONIN I (HIGH SENSITIVITY)   ____________________________________________  EKG  ED ECG REPORT I, Naaman Plummer, the attending physician, personally viewed and interpreted this ECG.  Date: 08/17/2020 EKG Time: 1954 Rate: 88 Rhythm: normal sinus rhythm QRS Axis: normal Intervals: normal ST/T Wave abnormalities: normal Narrative Interpretation: no evidence of acute ischemia  ____________________________________________  RADIOLOGY  ED MD interpretation: 2 view x-ray of the chest shows emphysematous changes without any evidence of acute pneumonia, pneumothorax, or widened mediastinum  Official radiology report(s): DG Chest 2 View  Result Date:  08/16/2020 CLINICAL DATA:  Chest pain and left arm pain all day. EXAM: CHEST - 2 VIEW COMPARISON:  01/13/2020 FINDINGS: Borderline heart size with normal pulmonary vascularity. Mild emphysematous changes in the lungs with scattered fibrosis. No airspace disease or consolidation. No pleural effusions. No pneumothorax. Mediastinal contours appear intact. Calcified and tortuous aorta. Diffuse bone demineralization with degenerative changes in the spine and shoulders. Similar appearance to previous study.  IMPRESSION: Emphysematous changes in the lungs. No evidence of active pulmonary disease. Electronically Signed   By: Burman Nieves M.D.   On: 08/16/2020 20:31    ____________________________________________   PROCEDURES  Procedure(s) performed (including Critical Care):  .1-3 Lead EKG Interpretation Performed by: Merwyn Katos, MD Authorized by: Merwyn Katos, MD     Interpretation: normal     ECG rate:  72   ECG rate assessment: normal     Rhythm: sinus rhythm     Ectopy: none     Conduction: normal       ____________________________________________   INITIAL IMPRESSION / ASSESSMENT AND PLAN / ED COURSE  As part of my medical decision making, I reviewed the following data within the electronic MEDICAL RECORD NUMBER Nursing notes reviewed and incorporated, Labs reviewed, EKG interpreted, Old chart reviewed, Radiograph reviewed and Notes from prior ED visits reviewed and incorporated        Workup: ECG, CXR, CBC, BMP, Troponin Findings: ECG: No overt evidence of STEMI. No evidence of Brugadas sign, delta wave, epsilon wave, significantly prolonged QTc, or malignant arrhythmia HS Troponin: Negative x1 Other Labs unremarkable for emergent problems. CXR: Without PTX, PNA, or widened mediastinum Last Stress Test:  2015 Last Heart Catheterization:  2015 HEART Score: 4  Given History, Exam, and Workup I have low suspicion for ACS, Pneumothorax, Pneumonia, Pulmonary Embolus,  Tamponade, Aortic Dissection or other emergent problem as a cause for this presentation.   High Risk Chest Pain Patient at increased risk for Major Adverse Cardiac Event (AMI, PCI, CABG, death) Interventions: ASA 324mg  Defer Heparin drip as patient pain free at this time,   Disposition: Admit for continued cardiac monitoring and trending of troponins as well as further evaluation for potential inpatient stress testing vs cardiac catheterization and coronary angiography.      ____________________________________________   FINAL CLINICAL IMPRESSION(S) / ED DIAGNOSES  Final diagnoses:  Chest pain, unspecified type  Elevated troponin     ED Discharge Orders    None       Note:  This document was prepared using Dragon voice recognition software and may include unintentional dictation errors.   , MD 08/17/20 [phone]

## 2020-08-17 NOTE — Discharge Instructions (Signed)
You were cared for by a hospitalist during your hospital stay. If you have any questions about your discharge medications or the care you received while you were in the hospital after you are discharged, you can call the unit and ask to speak with the hospitalist on call if the hospitalist that took care of you is not available. Once you are discharged, your primary care physician will handle any further medical issues. Please note that NO REFILLS for any discharge medications will be authorized once you are discharged, as it is imperative that you return to your primary care physician (or establish a relationship with a primary care physician if you do not have one) for your aftercare needs so that they can reassess your need for medications and monitor your lab values.  Follow up with PCP and Dr. Mariah Milling of cardiology. Take all medications as prescribed. If symptoms change or worsen please return to the ED for evaluation

## 2020-08-17 NOTE — ED Notes (Signed)
Pt d/c per MD order. IV removed and intact. Discussed discharge instructions with pt and daughter. Pt verbalized understanding. Pt escorted via wheelchair.

## 2020-08-17 NOTE — ED Notes (Signed)
Family at bedside, pt provided with warm blanket

## 2020-08-17 NOTE — H&P (Addendum)
History and Physical    William Taylor [id number] DOB: [birth year] DOA: 08/17/2020  Referring MD/NP/PA:   PCP: [name], MD   Patient coming from:  The patient is coming from home.  At baseline, pt is independent for most of ADL.        Chief Complaint: chest pain  HPI: William Taylor is a 86 y.o. male with medical history significant of CAD with stent, HTN, HLD, PAF and PE on Xarelto, CKD-III, prostate CA, GIB, anemia, who present with CP.  Patient states that he started having some left arm pain initially yesterday, then central chest pain.  The chest pain is pressure-like, moderate, radiating to left arm. Patient states that he was diaphoretic and felt that he was going to pass out, but did not.  Patient took 2 nitroglycerin prior to EMS arrival and had no relief.  Patient states that he also took 4 chewable aspirin by EMS and felt increasing relief over the next hour. Pt states that he has been having some dry cough, sore throat recently, PCP prescribed doxycycline.  His chest pain started after he took doxycycline.  No nausea vomiting, diarrhea or abdominal pain no symptoms of UTI.  ED Course: pt was found to have troponin level 12, 25, negative Covid PCR, WBC 7.6, renal function close to baseline, temperature normal, blood pressure 139/104, heart rate 88, RR 20, oxygen saturation 97% on room air.  Chest x-ray showed emphysema change without infiltration.  Patient is placed on progressive bed of observation, Dr. Rockey Situ of cardiology is consulted.    Review of Systems:   General: no fevers, chills, no body weight gain, has fatigue HEENT: no blurry vision, hearing changes, has sore throat Respiratory: no dyspnea, has coughing, no wheezing CV: has chest pain, no palpitations GI: no nausea, vomiting, abdominal pain, diarrhea, constipation GU: no dysuria, burning on urination, increased urinary frequency, hematuria  Ext: no leg edema Neuro: no unilateral weakness,  numbness, or tingling, no vision change or hearing loss Skin: no rash, no skin tear. MSK: No muscle spasm, no deformity, no limitation of range of movement in spin Heme: No easy bruising.  Travel history: No recent long distant travel.  Allergy:  Allergies  Allergen Reactions  . Bee Venom Swelling  . Sulfasalazine Hives and Swelling  . Sulfa Antibiotics Hives and Swelling  . Warfarin And Related Other (See Comments)    Chest pain    Past Medical History:  Diagnosis Date  . Cancer [facility])    Prostate, followed by Dr. Jacqlyn Larsen  . Colon polyp   . Coronary artery disease   . Gall stones   . Hyperlipidemia   . Hypertension   . MI (myocardial infarction) ([place])    2015  . PAF (paroxysmal atrial fibrillation) ([place])    a. on xarelto  . Skin cancer     Past Surgical History:  Procedure Laterality Date  . CARDIAC CATHETERIZATION  10/2013   [facility]  . CATARACT EXTRACTION  Oct. 3, 2012   right eye  . colonoscopy    . COLONOSCOPY W/ POLYPECTOMY  2015   Dr Rayann Heman  . COLONOSCOPY WITH PROPOFOL N/A 08/15/2016   Procedure: COLONOSCOPY WITH PROPOFOL;  Surgeon: [name], MD;  Location: [facility] ENDOSCOPY;  Service: Endoscopy;  Laterality: N/A;  . CORONARY ANGIOPLASTY  2009   2005; s/p stent  . ENTEROSCOPY N/A 09/22/2018   Procedure: ENTEROSCOPY;  Surgeon: Lin Landsman, MD;  Location: [place];  Service: Gastroenterology;  Laterality: N/A;  .  ESOPHAGOGASTRODUODENOSCOPY (EGD) WITH PROPOFOL N/A 08/15/2016   Procedure: ESOPHAGOGASTRODUODENOSCOPY (EGD) WITH PROPOFOL;  Surgeon: [name], MD;  Location: [facility] ENDOSCOPY;  Service: Endoscopy;  Laterality: N/A;  . GIVENS CAPSULE STUDY N/A 09/26/2016   Procedure: GIVENS CAPSULE STUDY;  Surgeon: [name], MD;  Location: [facility] ENDOSCOPY;  Service: Endoscopy;  Laterality: N/A;  . HERNIA REPAIR  2013  . LUMBAR SPINE SURGERY    . UPPER GI ENDOSCOPY  Sept 2015   Dr Rayann Heman    Social History:  reports that he has never smoked. He has never used smokeless  tobacco. He reports that he does not drink alcohol and does not use drugs.  Family History:  Family History  Problem Relation Age of Onset  . Stroke Mother   . Colon cancer Father   . Coronary artery disease Brother   . Other Brother 73       lymes dz     Prior to Admission medications   Medication Sig Start Date End Date Taking? Authorizing Provider  amiodarone (PACERONE) 100 MG tablet Take 1 tablet (100 mg total) by mouth daily. 02/16/20 05/03/20  Loel Dubonnet, NP  doxycycline (VIBRA-TABS) 100 MG tablet Take 1 tablet (100 mg total) by mouth 2 (two) times daily. 08/16/20   Sharion Balloon, FNP  fenofibrate micronized (ANTARA) 130 MG capsule TAKE 1 CAPSULE BY MOUTH  DAILY BEFORE BREAKFAST 03/24/20   [name], MD  finasteride (PROSCAR) 5 MG tablet Take 1 tablet (5 mg total) by mouth daily. 08/04/20   Stoioff, Ronda Fairly, MD  levobunolol (BETAGAN) 0.5 % ophthalmic solution Place 1 drop into the right eye 2 (two) times daily.    [provider]  lisinopril (ZESTRIL) 10 MG tablet Take 1 tablet (10 mg total) by mouth daily. 06/26/20   [name], MD  melatonin 3 MG TABS tablet Take by mouth. 07/17/20   [provider]  metoprolol tartrate (LOPRESSOR) 25 MG tablet Take 0.5 tablets (12.5 mg total) by mouth 2 (two) times daily. Cut in 1/2 for pt hold if BP <100/<60. 05/25/20   McLean-Scocuzza, Nino Glow, MD  mirtazapine (REMERON) 15 MG tablet TAKE 1 TABLET BY MOUTH NIGHTLY 07/10/20   [name], MD  Multiple Vitamin (MULTIVITAMIN PO) Take 1 tablet by mouth daily.    [provider]  nitroGLYCERIN (NITROSTAT) 0.4 MG SL tablet DISSOLVE UNDER THE TONGUE 1 TABLET EVERY 5 MINUTES AS NEEDED FOR CHEST PAIN 07/31/20   [name], MD  pantoprazole (PROTONIX) 40 MG tablet TAKE ONE TABLET BY MOUTH TWICE DAILY 08/25/19   [name], MD  polyethylene glycol (MIRALAX / GLYCOLAX) 17 g packet Take 17 g by mouth daily as needed.    [provider]   pravastatin (PRAVACHOL) 40 MG tablet TAKE 1 TABLET BY MOUTH AT  BEDTIME 05/24/20   [name], MD  Rivaroxaban (XARELTO) 15 MG TABS tablet Take 1 tablet (15 mg total) by mouth daily. 05/24/20   [name], MD  silodosin (RAPAFLO) 4 MG CAPS capsule TAKE 1 CAPSULE BY MOUTH EVERY DAY WITH BREAKFAST 08/10/20   Nori Riis, PA-C    Physical Exam: Vitals:   08/17/20 0900 08/17/20 0930 08/17/20 1250 08/17/20 1255  BP: (!) 159/83 (!) 152/88    Pulse:   79 80  Resp: 14 13 14 13   Temp:      TempSrc:      SpO2:   96% 96%   General: Not in acute distress HEENT:  Eyes: PERRL, EOMI, no scleral icterus.       ENT: No discharge from the ears and nose, no pharynx injection, no tonsillar enlargement.        Neck: No JVD, no bruit, no mass felt. Heme: No neck lymph node enlargement. Cardiac: S1/S2, RRR, No murmurs, No gallops or rubs. Respiratory: No rales, wheezing, rhonchi or rubs. GI: Soft, nondistended, nontender, no rebound pain, no organomegaly, BS present. GU: No hematuria Ext: No pitting leg edema bilaterally. 2+DP/PT pulse bilaterally. Musculoskeletal: No joint deformities, No joint redness or warmth, no limitation of ROM in spin. Skin: No rashes.  Neuro: Alert, oriented X3, cranial nerves II-XII grossly intact, moves all extremities normally. Psych: Patient is not psychotic, no suicidal or hemocidal ideation.  Labs on Admission: I have personally reviewed following labs and imaging studies  CBC: Recent Labs  Lab 08/16/20 1958  WBC 7.6  HGB 10.8*  HCT 30.9*  MCV 100.3*  PLT 264   Basic Metabolic Panel: Recent Labs  Lab 08/16/20 1958  NA 141  K 4.0  CL 106  CO2 25  GLUCOSE 188*  BUN 27*  CREATININE 1.35*  CALCIUM 8.9   GFR: Estimated Creatinine Clearance: 46.4 mL/min (A) (by C-G formula based on SCr of 1.35 mg/dL (H)). Liver Function Tests: No results for input(s): AST, ALT, ALKPHOS, BILITOT, PROT, ALBUMIN in the last 168 hours. No results  for input(s): LIPASE, AMYLASE in the last 168 hours. No results for input(s): AMMONIA in the last 168 hours. Coagulation Profile: No results for input(s): INR, PROTIME in the last 168 hours. Cardiac Enzymes: No results for input(s): CKTOTAL, CKMB, CKMBINDEX, TROPONINI in the last 168 hours. BNP (last 3 results) No results for input(s): PROBNP in the last 8760 hours. HbA1C: No results for input(s): HGBA1C in the last 72 hours. CBG: No results for input(s): GLUCAP in the last 168 hours. Lipid Profile: No results for input(s): CHOL, HDL, LDLCALC, TRIG, CHOLHDL, LDLDIRECT in the last 72 hours. Thyroid Function Tests: No results for input(s): TSH, T4TOTAL, FREET4, T3FREE, THYROIDAB in the last 72 hours. Anemia Panel: No results for input(s): VITAMINB12, FOLATE, FERRITIN, TIBC, IRON, RETICCTPCT in the last 72 hours. Urine analysis:    Component Value Date/Time   COLORURINE YELLOW (A) 12/15/2018 1506   APPEARANCEUR CLEAR (A) 12/15/2018 1506   APPEARANCEUR Hazy 02/27/2014 1335   LABSPEC 1.010 12/15/2018 1506   LABSPEC 1.017 02/27/2014 1335   PHURINE 7.0 12/15/2018 1506   GLUCOSEU NEGATIVE 12/15/2018 1506   GLUCOSEU Negative 02/27/2014 1335   HGBUR NEGATIVE 12/15/2018 1506   BILIRUBINUR NEGATIVE 12/15/2018 1506   BILIRUBINUR neg 03/01/2014 1334   BILIRUBINUR Negative 02/27/2014 1335   KETONESUR NEGATIVE 12/15/2018 1506   PROTEINUR NEGATIVE 12/15/2018 1506   UROBILINOGEN 0.2 03/01/2014 1334   NITRITE NEGATIVE 12/15/2018 1506   LEUKOCYTESUR NEGATIVE 12/15/2018 1506   LEUKOCYTESUR Negative 02/27/2014 1335   Sepsis Labs: @LABRCNTIP (procalcitonin:4,lacticidven:4) ) Recent Results (from the past 240 hour(s))  Resp Panel by RT-PCR (Flu A&B, Covid) Nasopharyngeal Swab     Status: None   Collection Time: 08/17/20  5:12 AM   Specimen: Nasopharyngeal Swab; Nasopharyngeal(NP) swabs in vial transport medium  Result Value Ref Range Status   SARS Coronavirus 2 by RT PCR NEGATIVE NEGATIVE  Final    Comment: (NOTE) SARS-CoV-2 target nucleic acids are NOT DETECTED.  The SARS-CoV-2 RNA is generally detectable in upper respiratory specimens during the acute phase of infection. The lowest concentration of SARS-CoV-2 viral copies this assay can detect is 138 copies/mL.  A negative result does not preclude SARS-Cov-2 infection and should not be used as the sole basis for treatment or other patient management decisions. A negative result may occur with  improper specimen collection/handling, submission of specimen other than nasopharyngeal swab, presence of viral mutation(s) within the areas targeted by this assay, and inadequate number of viral copies(<138 copies/mL). A negative result must be combined with clinical observations, patient history, and epidemiological information. The expected result is Negative.  Fact Sheet for Patients:  EntrepreneurPulse.com.au  Fact Sheet for Healthcare Providers:  IncredibleEmployment.be  This test is no t yet approved or cleared by the [place] FDA and  has been authorized for detection and/or diagnosis of SARS-CoV-2 by FDA under an Emergency Use Authorization (EUA). This EUA will remain  in effect (meaning this test can be used) for the duration of the COVID-19 declaration under Section 564(b)(1) of the Act, 21 U.S.C.section 360bbb-3(b)(1), unless the authorization is terminated  or revoked sooner.       Influenza A by PCR NEGATIVE NEGATIVE Final   Influenza B by PCR NEGATIVE NEGATIVE Final    Comment: (NOTE) The Xpert Xpress SARS-CoV-2/FLU/RSV plus assay is intended as an aid in the diagnosis of influenza from Nasopharyngeal swab specimens and should not be used as a sole basis for treatment. Nasal washings and aspirates are unacceptable for Xpert Xpress SARS-CoV-2/FLU/RSV testing.  Fact Sheet for Patients: EntrepreneurPulse.com.au  Fact Sheet for Healthcare  Providers: IncredibleEmployment.be  This test is not yet approved or cleared by the [place] FDA and has been authorized for detection and/or diagnosis of SARS-CoV-2 by FDA under an Emergency Use Authorization (EUA). This EUA will remain in effect (meaning this test can be used) for the duration of the COVID-19 declaration under Section 564(b)(1) of the Act, 21 U.S.C. section 360bbb-3(b)(1), unless the authorization is terminated or revoked.  Performed at [facility], [address] [place], [place] [address]      Radiological Exams on Admission: DG Chest 2 View  Result Date: 08/16/2020 CLINICAL DATA:  Chest pain and left arm pain all day. EXAM: CHEST - 2 VIEW COMPARISON:  01/13/2020 FINDINGS: Borderline heart size with normal pulmonary vascularity. Mild emphysematous changes in the lungs with scattered fibrosis. No airspace disease or consolidation. No pleural effusions. No pneumothorax. Mediastinal contours appear intact. Calcified and tortuous aorta. Diffuse bone demineralization with degenerative changes in the spine and shoulders. Similar appearance to previous study. IMPRESSION: Emphysematous changes in the lungs. No evidence of active pulmonary disease. Electronically Signed   By: Lucienne Capers M.D.   On: 08/16/2020 20:31   NM Myocar Multi W/Spect W/Wall Motion / EF  Result Date: 08/17/2020 Pharmacological myocardial perfusion imaging study with no significant  ischemia Normal wall motion, EF estimated at 77% No EKG changes concerning for ischemia at peak stress or in recovery. Low risk scan Signed, Esmond Plants, MD, Ph.D [facility]     EKG: I have personally reviewed.  Sinus rhythm, QTC 447, low voltage, LAD, nonspecific T wave change  Assessment/Plan Principal Problem:   Chest pain Active Problems:   Mixed hyperlipidemia   Paroxysmal A-fib (HCC)   Pulmonary embolism (HCC)   CKD (chronic kidney disease), stage IIIa    Hypertension   Prostate cancer (HCC)   Dizziness   Coronary artery disease   GERD (gastroesophageal reflux disease)   Elevated troponin   Sore throat   Chest pain, hx of CAD and elevated trop: s/p of stetn. Trop 12 -->25 -->35. Dr. Rockey Situ is consulted.  Will do Myoview stress test.  Patient states that his chest pain started 30 minutes after taking doxycycline, but I think it is unlikely related.  - place to progressive unit for observation - switch Xarelto to IV heparin - Trend Trop - Repeat EKG in the am  - prn Nitroglycerin, Morphine -Pravastatin, fenofibrate - Risk factor stratification: will check FLP and A1C  - 2d echo  Mixed hyperlipidemia -Pravastatin, fenofibrate  Paroxysmal A-fib (HCC) -We will continue metoprolol and amiodarone -Switched Xarelto to IV heparin  Pulmonary embolism (HCC) -IV heparin  CKD (chronic kidney disease), stage IIIa: Stable -Follow-up by BMP  Hypertension -IV hydralazine as needed -Continue lisinopril, metoprolol  History of prostate cancer (HCC) -On Proscar  GERD (gastroesophageal reflux disease): -protonix  Sore throat: -continue home doxycycline    DVT ppx: on IV Heparin    Code Status: DNR per his wife who is POA (his daughter asked her mother by phone) Family Communication:   Yes, patient's daughter  at bed side Disposition Plan:  Anticipate discharge back to previous environment Consults called:  Dr. Rockey Situ of card Admission status:  progressive unit for obs   Status is: Observation  The patient remains OBS appropriate and will d/c before 2 midnights.  Dispo: The patient is from: Home              Anticipated d/c is to: Home              Anticipated d/c date is: 1 day              Patient currently is not medically stable to d/c.           Date of Service 08/17/2020    [name] Costa [facility]   If 7PM-7AM, please contact night-coverage [email or web address] 08/17/2020, 6:54 PM

## 2020-08-22 ENCOUNTER — Telehealth: Payer: Self-pay | Admitting: Family Medicine

## 2020-08-22 NOTE — Telephone Encounter (Signed)
Patient called in for hospital follow appointment on schedule for 08-30-20 11:30

## 2020-08-23 NOTE — Telephone Encounter (Signed)
TCM does not qualify standard ED visit.

## 2020-08-23 NOTE — Telephone Encounter (Signed)
Noted  

## 2020-08-28 ENCOUNTER — Other Ambulatory Visit: Payer: Self-pay

## 2020-08-30 ENCOUNTER — Encounter: Payer: Self-pay | Admitting: Family Medicine

## 2020-08-30 ENCOUNTER — Other Ambulatory Visit: Payer: Self-pay

## 2020-08-30 ENCOUNTER — Ambulatory Visit: Payer: Medicare HMO | Admitting: Family Medicine

## 2020-08-30 DIAGNOSIS — R079 Chest pain, unspecified: Secondary | ICD-10-CM | POA: Diagnosis not present

## 2020-08-30 DIAGNOSIS — J309 Allergic rhinitis, unspecified: Secondary | ICD-10-CM | POA: Diagnosis not present

## 2020-08-30 DIAGNOSIS — I2510 Atherosclerotic heart disease of native coronary artery without angina pectoris: Secondary | ICD-10-CM

## 2020-08-30 MED ORDER — AZELASTINE HCL 0.1 % NA SOLN: 2.0000 | Freq: Two times a day (BID) | NASAL | 1 refills | Status: DC

## 2020-08-30 MED ORDER — LORATADINE 10 MG PO TABS: 10.0000 mg | ORAL_TABLET | Freq: Every day | ORAL | 1 refills | Status: AC

## 2020-08-30 NOTE — Patient Instructions (Addendum)
Nice to see you. Please try the Astelin nasal spray and Claritin to see if those are beneficial for your allergies. Please contact Dr. Donivan Scull office to get scheduled for follow-up with him.

## 2020-08-30 NOTE — Assessment & Plan Note (Signed)
Stress test was low risk.  I encouraged him to follow-up with his cardiologist.

## 2020-08-30 NOTE — Progress Notes (Signed)
William Rumps, MD Phone: [phone]  William Taylor is a 87 y.o. male who presents today for f/u.  Chest pain: Patient was seen in the emergency department for this.  The chest pain started after taking a pill of doxycycline which she was taking for a possible respiratory infection.  His troponin did trend up and they did a stress test which was low risk.  His cardiologist saw him in the ED.  He was discharged home after his negative stress test.  He did have a COVID test that was negative 14 days ago.  Allergic rhinitis: Patient noted onset of sore throat, postnasal drip, and rhinorrhea on 12/24.  He was started on doxycycline for this though had an apparent adverse reaction to this with his chest pain as outlined above.  He was tested for COVID-19 on 12/30 and this was negative.  His symptoms persisted and he went to the walk-in clinic a week ago.  He was tested for strep throat which was negative.  They treated him with Omnicef.  He notes continued symptoms of postnasal drip and rhinorrhea.  He does typically have allergy symptoms in the spring and fall.  Social History   Tobacco Use  Smoking Status Never Smoker  Smokeless Tobacco Never Used    Current Outpatient Medications on File Prior to Visit  Medication Sig Dispense Refill  . cefdinir (OMNICEF) 300 MG capsule Take 300 mg by mouth 2 (two) times daily.    Marland Kitchen dextromethorphan-guaiFENesin (MUCINEX DM) 30-600 MG 12hr tablet Take 1 tablet by mouth 2 (two) times daily as needed for cough. 14 tablet 0  . doxycycline (VIBRA-TABS) 100 MG tablet Take 1 tablet (100 mg total) by mouth 2 (two) times daily. 20 tablet 0  . fenofibrate micronized (ANTARA) 130 MG capsule TAKE 1 CAPSULE BY MOUTH  DAILY BEFORE BREAKFAST 90 capsule 3  . finasteride (PROSCAR) 5 MG tablet Take 1 tablet (5 mg total) by mouth daily. 90 tablet 3  . levobunolol (BETAGAN) 0.5 % ophthalmic solution Place 1 drop into the right eye 2 (two) times daily.    Marland Kitchen lisinopril  (ZESTRIL) 10 MG tablet Take 1 tablet (10 mg total) by mouth daily. 90 tablet 1  . melatonin 3 MG TABS tablet Take by mouth.    . metoprolol tartrate (LOPRESSOR) 25 MG tablet Take 0.5 tablets (12.5 mg total) by mouth 2 (two) times daily. Cut in 1/2 for pt hold if BP <100/<60. 90 tablet 3  . mirtazapine (REMERON) 15 MG tablet TAKE 1 TABLET BY MOUTH NIGHTLY 30 tablet 3  . Multiple Vitamin (MULTIVITAMIN PO) Take 1 tablet by mouth daily.    . nitroGLYCERIN (NITROSTAT) 0.4 MG SL tablet DISSOLVE UNDER THE TONGUE 1 TABLET EVERY 5 MINUTES AS NEEDED FOR CHEST PAIN 25 tablet 0  . pantoprazole (PROTONIX) 40 MG tablet TAKE ONE TABLET BY MOUTH TWICE DAILY 60 tablet 5  . pravastatin (PRAVACHOL) 40 MG tablet TAKE 1 TABLET BY MOUTH AT  BEDTIME 90 tablet 3  . Rivaroxaban (XARELTO) 15 MG TABS tablet Take 1 tablet (15 mg total) by mouth daily. 90 tablet 1  . silodosin (RAPAFLO) 4 MG CAPS capsule TAKE 1 CAPSULE BY MOUTH EVERY DAY WITH BREAKFAST 30 capsule 0  . amiodarone (PACERONE) 100 MG tablet Take 1 tablet (100 mg total) by mouth daily. 90 tablet 3  . polyethylene glycol (MIRALAX / GLYCOLAX) 17 g packet Take 17 g by mouth daily as needed.     No current facility-administered medications on file prior  to visit.     ROS see history of present illness  Objective  Physical Exam Vitals:   08/30/20 1125  BP: 140/80  Pulse: 70  Temp: 97.8 F (36.6 C)  SpO2: 99%    BP Readings from Last 3 Encounters:  08/30/20 140/80  08/17/20 (!) 152/88  08/04/20 139/75   Wt Readings from Last 3 Encounters:  08/30/20 210 lb 9.6 oz (95.5 kg)  08/04/20 213 lb (96.6 kg)  07/31/20 213 lb (96.6 kg)    Physical Exam Constitutional:      General: He is not in acute distress.    Appearance: He is not diaphoretic.  HENT:     Mouth/Throat:     Mouth: Mucous membranes are moist.     Pharynx: Oropharynx is clear. No oropharyngeal exudate or posterior oropharyngeal erythema.  Cardiovascular:     Rate and Rhythm: Normal  rate and regular rhythm.     Heart sounds: Normal heart sounds.  Pulmonary:     Effort: Pulmonary effort is normal.     Breath sounds: Normal breath sounds.  Musculoskeletal:        General: No edema.  Lymphadenopathy:     Cervical: No cervical adenopathy.  Skin:    General: Skin is warm and dry.  Neurological:     Mental Status: He is alert.      Assessment/Plan: Please see individual problem list.  Problem List Items Addressed This Visit    Allergic rhinitis    Symptoms likely related to allergic rhinitis.  The weather has been quite variable recently and that may have contributed.  He had a negative COVID test as well as a negative rapid strep test.  Based on treatment and negative evaluation it does not seem to be an infectious process.  He does have glaucoma and thus should not use nasal steroids.  We will treat with Astelin nasal spray and Claritin.  If not improving with this he will let us know.      Relevant Medications   azelastine (ASTELIN) 0.1 % nasal spray   loratadine (CLARITIN) 10 MG tablet   Chest pain    This was likely related to an adverse reaction to the doxycycline.  He had a reassuring stress test.  He has had no recurrence of symptoms.  I encouraged him to contact his cardiologist for follow-up.      Coronary artery disease    Stress test was low risk.  I encouraged him to follow-up with his cardiologist.           This visit occurred during the SARS-CoV-2 public health emergency.  Safety protocols were in place, including screening questions prior to the visit, additional usage of staff PPE, and extensive cleaning of exam room while observing appropriate contact time as indicated for disinfecting solutions.    William Rumps, MD [name]

## 2020-08-30 NOTE — Assessment & Plan Note (Addendum)
This was likely related to an adverse reaction to the doxycycline.  He had a reassuring stress test.  He has had no recurrence of symptoms.  I encouraged him to contact his cardiologist for follow-up.

## 2020-08-30 NOTE — Assessment & Plan Note (Signed)
Symptoms likely related to allergic rhinitis.  The weather has been quite variable recently and that may have contributed.  He had a negative COVID test as well as a negative rapid strep test.  Based on treatment and negative evaluation it does not seem to be an infectious process.  He does have glaucoma and thus should not use nasal steroids.  We will treat with Astelin nasal spray and Claritin.  If not improving with this he will let us know.

## 2020-09-01 ENCOUNTER — Telehealth: Payer: Self-pay | Admitting: Family Medicine

## 2020-09-01 DIAGNOSIS — Z5181 Encounter for therapeutic drug level monitoring: Secondary | ICD-10-CM

## 2020-09-01 NOTE — Telephone Encounter (Signed)
-----   Message from Loel Dubonnet, NP sent at 09/01/2020 12:51 PM EST ----- Regarding: FW: Need labs? Hi Dr. Caryl Bis,  [facility] you're having a good Friday. Mr. Crutchfield was seen by Dr. Rockey Situ 07/2020 and recommended for 1 year follow up. He is overdue for Amiodarone monitoring labs, no signs of toxicity during recent clinic visit. It looks like he has upcoming AWV on 09/08/20 and 3 month follow up with you 09/26/20. I was wondering if TSH and LFTs could be collected at one of these visits? If not, we can contact him to come to the [facility].   Thanks so much,  Loel Dubonnet, NP  ----- Message ----- From: Solmon Ice, RN Sent: 09/01/2020  12:29 PM EST To: Loel Dubonnet, NP Subject: Need labs?                                     Hi Caitlin,  Pt's insurance faxed a consideration reminder since pt is on Amiodarone regarding monitoring TSH and LFTs. I did not see where he has had any recent of these labs. Do I need to have him come in for lab work? Looks like he does not follow up with Dr. Rockey Situ until 07/2021.   Thanks!  Jinny Blossom

## 2020-09-01 NOTE — Telephone Encounter (Signed)
I called and inforemd the patient that he is due for labs at his cardiology office but since he is seeing the provider in february we will do alll labs then and he understood.  Laith Antonelli,cma

## 2020-09-01 NOTE — Telephone Encounter (Signed)
Please let the patient know that I received a message from his cardiology team.  He is due for lab work for them.  It looks like he is coming back to see me in February.  Please let him know that we will plan on doing labs at that time.  Please add that to his appointment note.

## 2020-09-08 ENCOUNTER — Encounter: Payer: Self-pay | Admitting: Emergency Medicine

## 2020-09-08 ENCOUNTER — Telehealth: Payer: Self-pay | Admitting: Cardiovascular Disease

## 2020-09-08 ENCOUNTER — Emergency Department: Payer: Medicare HMO

## 2020-09-08 ENCOUNTER — Other Ambulatory Visit: Payer: Self-pay

## 2020-09-08 ENCOUNTER — Ambulatory Visit: Payer: Medicare HMO | Admitting: Nurse Practitioner

## 2020-09-08 ENCOUNTER — Emergency Department
Admission: EM | Admit: 2020-09-08 | Discharge: 2020-09-08 | Disposition: A | Discharge status: Home / Self Care | Payer: Medicare HMO | Attending: Emergency Medicine | Admitting: Emergency Medicine

## 2020-09-08 ENCOUNTER — Encounter: Payer: Self-pay | Admitting: Radiology

## 2020-09-08 ENCOUNTER — Encounter: Payer: Self-pay | Admitting: Nurse Practitioner

## 2020-09-08 ENCOUNTER — Telehealth: Payer: Self-pay

## 2020-09-08 ENCOUNTER — Ambulatory Visit: Payer: Medicare HMO

## 2020-09-08 VITALS — BP 140/80 | HR 103 | Ht 76.0 in | Wt 212.0 lb

## 2020-09-08 DIAGNOSIS — I4891 Unspecified atrial fibrillation: Secondary | ICD-10-CM | POA: Diagnosis not present

## 2020-09-08 DIAGNOSIS — I129 Hypertensive chronic kidney disease with stage 1 through stage 4 chronic kidney disease, or unspecified chronic kidney disease: Secondary | ICD-10-CM | POA: Insufficient documentation

## 2020-09-08 DIAGNOSIS — Z7901 Long term (current) use of anticoagulants: Secondary | ICD-10-CM | POA: Insufficient documentation

## 2020-09-08 DIAGNOSIS — Z955 Presence of coronary angioplasty implant and graft: Secondary | ICD-10-CM | POA: Insufficient documentation

## 2020-09-08 DIAGNOSIS — R0789 Other chest pain: Secondary | ICD-10-CM

## 2020-09-08 DIAGNOSIS — I200 Unstable angina: Secondary | ICD-10-CM

## 2020-09-08 DIAGNOSIS — I10 Essential (primary) hypertension: Secondary | ICD-10-CM | POA: Diagnosis not present

## 2020-09-08 DIAGNOSIS — I480 Paroxysmal atrial fibrillation: Secondary | ICD-10-CM

## 2020-09-08 DIAGNOSIS — E785 Hyperlipidemia, unspecified: Secondary | ICD-10-CM

## 2020-09-08 DIAGNOSIS — Z79899 Other long term (current) drug therapy: Secondary | ICD-10-CM | POA: Insufficient documentation

## 2020-09-08 DIAGNOSIS — I2510 Atherosclerotic heart disease of native coronary artery without angina pectoris: Secondary | ICD-10-CM

## 2020-09-08 DIAGNOSIS — Z8546 Personal history of malignant neoplasm of prostate: Secondary | ICD-10-CM | POA: Diagnosis not present

## 2020-09-08 DIAGNOSIS — R079 Chest pain, unspecified: Secondary | ICD-10-CM | POA: Diagnosis present

## 2020-09-08 DIAGNOSIS — N1831 Chronic kidney disease, stage 3a: Secondary | ICD-10-CM | POA: Diagnosis not present

## 2020-09-08 DIAGNOSIS — R000 Tachycardia, unspecified: Secondary | ICD-10-CM

## 2020-09-08 LAB — CBC
HCT: 33.4 % — ABNORMAL LOW (ref 39.0–52.0)
Hemoglobin: 11.6 g/dL — ABNORMAL LOW (ref 13.0–17.0)
MCH: 34.6 pg — ABNORMAL HIGH (ref 26.0–34.0)
MCHC: 34.7 g/dL (ref 30.0–36.0)
MCV: 99.7 fL (ref 80.0–100.0)
Platelets: 275 10*3/uL (ref 150–400)
RBC: 3.35 MIL/uL — ABNORMAL LOW (ref 4.22–5.81)
RDW: 14.8 % (ref 11.5–15.5)
WBC: 10.3 10*3/uL (ref 4.0–10.5)
nRBC: 0 % (ref 0.0–0.2)

## 2020-09-08 LAB — TROPONIN I (HIGH SENSITIVITY)
Troponin I (High Sensitivity): 10 ng/L (ref <18)
Troponin I (High Sensitivity): 11 ng/L (ref <18)

## 2020-09-08 LAB — BASIC METABOLIC PANEL
Anion gap: 13 (ref 5–15)
BUN: 29 mg/dL — ABNORMAL HIGH (ref 8–23)
CO2: 23 mmol/L (ref 22–32)
Calcium: 9.9 mg/dL (ref 8.9–10.3)
Chloride: 106 mmol/L (ref 98–111)
Creatinine, Ser: 1.43 mg/dL — ABNORMAL HIGH (ref 0.61–1.24)
GFR, Estimated: 47 mL/min — ABNORMAL LOW (ref >60)
Glucose, Bld: 113 mg/dL — ABNORMAL HIGH (ref 70–99)
Potassium: 4.5 mmol/L (ref 3.5–5.1)
Sodium: 142 mmol/L (ref 135–145)

## 2020-09-08 MED ORDER — IOHEXOL 350 MG/ML SOLN
75.0000 mL | Freq: Once | INTRAVENOUS | Status: DC | PRN
  Filled 2020-09-08: qty 75

## 2020-09-08 MED ORDER — IOHEXOL 350 MG/ML SOLN
100.0000 mL | Freq: Once | INTRAVENOUS | Status: AC | PRN
  Administered 2020-09-08: 100 mL via INTRAVENOUS
  Filled 2020-09-08: qty 100

## 2020-09-08 MED ORDER — IOHEXOL 350 MG/ML SOLN
100.0000 mL | Freq: Once | INTRAVENOUS | Status: DC | PRN
  Filled 2020-09-08: qty 100

## 2020-09-08 NOTE — ED Provider Notes (Signed)
[facility] Emergency Department Provider Note   ____________________________________________    I have reviewed the triage vital signs and the nursing notes.   HISTORY  Chief Complaint Chest Pain     HPI TAEVYN HAUSEN is a 87 y.o. male with a history of coronary artery disease, hypertension, hyperlipidemia sent from cardiology office for evaluation for complaints of chest discomfort over the last 4 days.  Patient describes relatively constant mild chest pressure, does not seem to be worsened with exertion.  He is on Xarelto for history of PE.  Noted to have an elevated heart rate at cardiology office although improved here.  Denies fevers chills or cough.  No calf pain or swelling  Past Medical History:  Diagnosis Date  . Cancer [facility])    Prostate, followed by Dr. Jacqlyn Larsen  . Colon polyp   . Coronary artery disease    a. 2005 s/p PCI LAD ([facility]); b 2012 s/p PCI OM2; c.10/2013 Cath: LAD 9m ISR, 52m, D1 90, D2 70, OM2 80 w/ patent stent;  d. 06/2019 Low risk MV; e. 07/2020 MV: EF 77%, no ischemia/infarct.  [place] stones   . Hyperlipidemia   . Hypertension   . MI (myocardial infarction) ([place])    2015  . Orthostatic hypotension    a. prev on midodrine/florinef/northera.  Marland Kitchen PAF (paroxysmal atrial fibrillation) (HCC)    a. on xarelto and amio (CHA2DS2VASc = 4).  . Skin cancer     Patient Active Problem List   Diagnosis Date Noted  . Allergic rhinitis 08/30/2020  . Sore throat 08/17/2020  . Coronary artery disease   . GERD (gastroesophageal reflux disease)   . Elevated troponin   . Benign prostatic hyperplasia with lower urinary tract symptoms 08/13/2020  . Fall 11/11/2019  . 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 Osteoarthritis of knees, bilateral 08/31/2019  . Depression 01/11/2019  . Dizziness 12/15/2018  . History of GI bleed   . Malnutrition of moderate degree 09/21/2018  . Orthostatic hypotension 06/06/2018  . Chronic pain of left  knee 06/05/2018  . Chronic pain in testicle 06/05/2018  . Chronic back pain 02/17/2018  . Chest pain 03/07/2017  . Angiodysplasia of intestinal tract   . Iron deficiency anemia   . Benign neoplasm of ascending colon   . Diverticulosis of large intestine without diverticulitis   . Gastric polyp   . Left rotator cuff tear arthropathy 09/11/2015  . Fatigue 04/15/2014  . Abdominal pain 01/18/2014  . Prostate cancer ([place]) 03/15/2013  . DOE (dyspnea on exertion) 11/23/2012  . Insomnia 09/03/2012  . Hypertension 07/23/2012  . CKD (chronic kidney disease), stage IIIa 07/23/2011  . Pulmonary embolism ([place]) 01/09/2011  . Paroxysmal A-fib ([place]) 09/06/2010  . Mixed hyperlipidemia 05/19/2009    Past Surgical History:  Procedure Laterality Date  . CARDIAC CATHETERIZATION  10/2013   [facility]  . CATARACT EXTRACTION  Oct. 3, 2012   right eye  . colonoscopy    . COLONOSCOPY W/ POLYPECTOMY  2015   Dr Rayann Heman  . COLONOSCOPY WITH PROPOFOL N/A 08/15/2016   Procedure: COLONOSCOPY WITH PROPOFOL;  Surgeon: [name], MD;  Location: [facility] ENDOSCOPY;  Service: Endoscopy;  Laterality: N/A;  . CORONARY ANGIOPLASTY  2009   2005; s/p stent  . ENTEROSCOPY N/A 09/22/2018   Procedure: ENTEROSCOPY;  Surgeon: Lin Landsman, MD;  Location: [place];  Service: Gastroenterology;  Laterality: N/A;  . ESOPHAGOGASTRODUODENOSCOPY (EGD) WITH PROPOFOL N/A 08/15/2016   Procedure: ESOPHAGOGASTRODUODENOSCOPY (EGD) WITH PROPOFOL;  Surgeon: [name], MD;  Location: [facility] ENDOSCOPY;  Service: Endoscopy;  Laterality: N/A;  . GIVENS CAPSULE STUDY N/A 09/26/2016   Procedure: GIVENS CAPSULE STUDY;  Surgeon: [name], MD;  Location: [facility] ENDOSCOPY;  Service: Endoscopy;  Laterality: N/A;  . HERNIA REPAIR  2013  . LUMBAR SPINE SURGERY    . UPPER GI ENDOSCOPY  Sept 2015   Dr Rayann Heman    Prior to Admission medications   Medication Sig Start Date End Date Taking? Authorizing Provider  amiodarone (PACERONE) 100 MG tablet Take 1  tablet (100 mg total) by mouth daily. 02/16/20 05/03/20  Loel Dubonnet, NP  azelastine (ASTELIN) 0.1 % nasal spray Place 2 sprays into both nostrils 2 (two) times daily. Use in each nostril as directed 08/30/20   [name], MD  fenofibrate micronized (ANTARA) 130 MG capsule TAKE 1 CAPSULE BY MOUTH  DAILY BEFORE BREAKFAST 03/24/20   [name], MD  finasteride (PROSCAR) 5 MG tablet Take 1 tablet (5 mg total) by mouth daily. 08/04/20   Stoioff, Ronda Fairly, MD  levobunolol (BETAGAN) 0.5 % ophthalmic solution Place 1 drop into the right eye 2 (two) times daily.    [provider]  loratadine (CLARITIN) 10 MG tablet Take 1 tablet (10 mg total) by mouth daily. 08/30/20   [name], MD  melatonin 3 MG TABS tablet Take by mouth. 07/17/20   [provider]  mirtazapine (REMERON) 15 MG tablet TAKE 1 TABLET BY MOUTH NIGHTLY 07/10/20   [name], MD  Multiple Vitamin (MULTIVITAMIN PO) Take 1 tablet by mouth daily.    [provider]  nitroGLYCERIN (NITROSTAT) 0.4 MG SL tablet DISSOLVE UNDER THE TONGUE 1 TABLET EVERY 5 MINUTES AS NEEDED FOR CHEST PAIN 07/31/20   [name], MD  pantoprazole (PROTONIX) 40 MG tablet TAKE ONE TABLET BY MOUTH TWICE DAILY 08/25/19   [name], MD  pravastatin (PRAVACHOL) 40 MG tablet TAKE 1 TABLET BY MOUTH AT  BEDTIME 05/24/20   [name], MD  Rivaroxaban (XARELTO) 15 MG TABS tablet Take 1 tablet (15 mg total) by mouth daily. 05/24/20   [name], MD     Allergies Bee venom, Sulfasalazine, Sulfa antibiotics, and Warfarin and related  Family History  Problem Relation Age of Onset  . Stroke Mother   . Colon cancer Father   . Coronary artery disease Brother   . Other Brother 26       lymes dz    Social History Social History   Tobacco Use  . Smoking status: Never Smoker  . Smokeless tobacco: Never Used  Vaping Use  . Vaping Use: Never used  Substance Use Topics  . Alcohol use: No  . Drug  use: No    Review of Systems  Constitutional: No fever/chills Eyes: No visual changes.  ENT: No sore throat. Cardiovascular: As above Respiratory: Denies shortness of breath. Gastrointestinal: No abdominal pain.  No nausea, no vomiting.   Genitourinary: Negative for dysuria. Musculoskeletal: Negative for back pain. Skin: Negative for rash. Neurological: Negative for headaches    ____________________________________________   PHYSICAL EXAM:  VITAL SIGNS: ED Triage Vitals [09/08/20 1617]  Enc Vitals Group     BP 125/89     Pulse Rate (!) 113     Resp (!) 21     Temp 98.2 F (36.8 C)     Temp Source Oral     SpO2 97 %     Weight  Height      Head Circumference      Peak Flow      Pain Score 4     Pain Loc      Pain Edu?      Excl. in [place]?     Constitutional: Alert and oriented.   Nose: No congestion/rhinnorhea. Mouth/Throat: Mucous membranes are moist.    Cardiovascular: Normal rate, regular rhythm. Grossly normal heart sounds.  Good peripheral circulation. Respiratory: Normal respiratory effort.  No retractions. Lungs CTAB. Gastrointestinal: Soft and nontender. No distention.  No CVA tenderness.  Musculoskeletal: No lower extremity tenderness nor edema.  Warm and well perfused Neurologic:  Normal speech and language. No gross focal neurologic deficits are appreciated.  Skin:  Skin is warm, dry and intact. No rash noted. Psychiatric: Mood and affect are normal. Speech and behavior are normal.  ____________________________________________   LABS (all labs ordered are listed, but only abnormal results are displayed)  Labs Reviewed  BASIC METABOLIC PANEL - Abnormal; Notable for the following components:      Result Value   Glucose, Bld 113 (*)    BUN 29 (*)    Creatinine, Ser 1.43 (*)    GFR, Estimated 47 (*)    All other components within normal limits  CBC - Abnormal; Notable for the following components:   RBC 3.35 (*)    Hemoglobin 11.6 (*)     HCT 33.4 (*)    MCH 34.6 (*)    All other components within normal limits  TROPONIN I (HIGH SENSITIVITY)  TROPONIN I (HIGH SENSITIVITY)   ____________________________________________  EKG  ED ECG REPORT I, Lavonia Drafts, the attending physician, personally viewed and interpreted this ECG.  Date: 09/08/2020  Rhythm: sinus tachycardia QRS Axis: normal Intervals: normal ST/T Wave abnormalities: normal Narrative Interpretation: no evidence of acute ischemia  ____________________________________________  RADIOLOGY  cxr reviewed by me, nad  ____________________________________________   PROCEDURES  Procedure(s) performed: No  Procedures   Critical Care performed: No ____________________________________________   INITIAL IMPRESSION / ASSESSMENT AND PLAN / ED COURSE  Pertinent labs & imaging results that were available during my care of the patient were reviewed by me and considered in my medical decision making (see chart for details).  Patient with chest discomfort as above, sent from cardiology office for delta troponin, PE rule out  EKG is overall reassuring, delta troponin unremarkable, no evidence of PE on CT angiography  Discussed with Dr. Acie Fredrickson of cardiology who recommends discharge with close outpatient follow-up with his cardiologist after evaluating EKG, labs, imaging  Patient agrees with plan, strict return precautions discussed   ____________________________________________   FINAL CLINICAL IMPRESSION(S) / ED DIAGNOSES  Final diagnoses:  Atypical chest pain        Note:  This document was prepared using Dragon voice recognition software and may include unintentional dictation errors.   Lavonia Drafts, MD 09/08/20 2131

## 2020-09-08 NOTE — Telephone Encounter (Signed)
Able to speak with pt. Pt reports past two days has not felt good, chest tightness, and HR 118 today and last night HR 102. Pt reports thinks he may be in A-fib, has had that in the past. Pt denies taking nitro "I don't thinks it that bad".   Pt stated seen in ED 12/29 for chest pain, EKG then was NSR, HR 88.  Trop: 12/29 @ 20:33  "8" 12/29 @ 00:23  "12" 12/30 @ 04:50  "25" 12/30 @ 09:21  "35"   He was discharge back home with dx of chest pain unspecific and evaluated trops. Pt reports he thinks he had "a heart attack". Advise pt to come in for an EKG and to be evaluated. Was able to get him on the schedule with Ignacia Bayley, NP at 15:00. Pt verbalized understanding, will be here for appt.

## 2020-09-08 NOTE — ED Triage Notes (Signed)
Pt to ED via POV with c/o substernal CP that he describes as pressure x 4 days, pt states sent by cardiologist for his HR being over 120. Pt states hx of A-fib at this time. PT visualized in NAD at this time.

## 2020-09-08 NOTE — Progress Notes (Signed)
Office Visit    Patient Name: William Taylor Date of Encounter: 09/08/2020  Primary Care Provider:  [name], MD Primary Cardiologist:  William Rogue, MD  Chief Complaint    87 year old male with a history of CAD status post prior LAD and OM2 stenting, paroxysmal atrial fibrillation on Xarelto, pulmonary embolism (2006, 2012), atypical chest pain, cold agglutinin hemolytic anemia, orthostatic hypotension, prostate cancer, hypertension, and hyperlipidemia, who presents for follow-up related to tachycardia.  Past Medical History    Past Medical History:  Diagnosis Date  . Cancer [facility])    Prostate, followed by Dr. Jacqlyn Taylor  . Colon polyp   . Coronary artery disease    a. 2005 s/p PCI LAD ([facility]); b 2012 s/p PCI OM2; c.10/2013 Cath: LAD 43m ISR, 71m, D1 90, D2 70, OM2 80 w/ patent stent;  d. 06/2019 Low risk MV; e. 07/2020 MV: EF 77%, no ischemia/infarct.  William Taylor stones   . Hyperlipidemia   . Hypertension   . MI (myocardial infarction) (William Taylor)    2015  . Orthostatic hypotension    a. prev on midodrine/florinef/northera.  Marland Kitchen PAF (paroxysmal atrial fibrillation) (HCC)    a. on xarelto and amio (CHA2DS2VASc = 4).  . Skin cancer    Past Surgical History:  Procedure Laterality Date  . CARDIAC CATHETERIZATION  10/2013   [facility]  . CATARACT EXTRACTION  Oct. 3, 2012   right eye  . colonoscopy    . COLONOSCOPY W/ POLYPECTOMY  2015   Dr William Taylor  . COLONOSCOPY WITH PROPOFOL N/A 08/15/2016   Procedure: COLONOSCOPY WITH PROPOFOL;  Surgeon: William Bellows, MD;  Location: [facility] ENDOSCOPY;  Service: Endoscopy;  Laterality: N/A;  . CORONARY ANGIOPLASTY  2009   2005; s/p stent  . ENTEROSCOPY N/A 09/22/2018   Procedure: ENTEROSCOPY;  Surgeon: William Landsman, MD;  Location: [place];  Service: Gastroenterology;  Laterality: N/A;  . ESOPHAGOGASTRODUODENOSCOPY (EGD) WITH PROPOFOL N/A 08/15/2016   Procedure: ESOPHAGOGASTRODUODENOSCOPY (EGD) WITH PROPOFOL;  Surgeon: William Bellows, MD;   Location: [facility] ENDOSCOPY;  Service: Endoscopy;  Laterality: N/A;  . GIVENS CAPSULE STUDY N/A 09/26/2016   Procedure: GIVENS CAPSULE STUDY;  Surgeon: William Bellows, MD;  Location: [facility] ENDOSCOPY;  Service: Endoscopy;  Laterality: N/A;  . HERNIA REPAIR  2013  . LUMBAR SPINE SURGERY    . UPPER GI ENDOSCOPY  Sept 2015   Dr William Taylor    Allergies  Allergies  Allergen Reactions  . Bee Venom Swelling  . Sulfasalazine Hives and Swelling  . Sulfa Antibiotics Hives and Swelling  . Warfarin And Related Other (See Comments)    Chest pain    History of Present Illness    87 year old male with the above past medical history including CAD, paroxysmal atrial fibrillation, pulmonary embolism, atypical chest pain, cold agglutinin hemolytic anemia, orthostatic hypotension, prostate cancer, hypertension, and hyperlipidemia.  He previously underwent stenting of the LAD in 2005.  Diagnostic catheterization 2009 showed a patent LAD stent with otherwise nonobstructive disease.  He was diagnosed with atrial fibrillation in September 2011 in the setting of a UTI and has had recurrent A. fib in at least 2012, 2013, and 2018.  He has been anticoagulated with Xarelto and managed with beta-blocker and amiodarone.  Diagnostic catheterization in 2012 showed a 90% OM two stenosis and underwent a drug-eluting stent placement at [facility].  Following catheterization, he developed flank pain and was diagnosed with pulmonary embolism with heavy clot burden.  As noted above, he has remained on Xarelto.  His most recent catheterization in March 2015 showed moderate diffuse disease with a 90% stenosis in the first diagonal and an 80% stenosis in the first obtuse marginal.  He has been medically managed since then and has undergone stress testing in November 2020 which was low risk.  With a history of orthostasis, he has been tried on Florinef, midodrine, and subsequently William Taylor.  Over the years, management has been scaled back and  William Taylor was actually discontinued in September 2021 in the setting of significant hypertension.  He was doing well at his office visit on July 31, 2020 however, he was seen in the emergency department in late December 2021 in the setting of somewhat atypical chest pain and flat troponins.  He underwent stress testing on that occasion which showed no evidence of ischemia and an EF of 77%.  Unfortunately, over the past 2 days, William Taylor has been feeling poorly.  He reports general malaise with constant chest pressure that worsens with exertion.  He also has noted dyspnea on exertion.  His heart rates have been elevated when he checks on his pulse ox, though oxygen saturations have been normal.  Because of the symptoms, he contacted our office earlier today and was added onto my schedule.  He is not currently in any acute distress though sinus tachycardia is noted on EKG.  He denies PND, orthopnea, dizziness, syncope, edema, or early satiety.  Home Medications    Prior to Admission medications   Medication Sig Start Date End Date Taking? Authorizing Provider  amiodarone (PACERONE) 100 MG tablet Take 1 tablet (100 mg total) by mouth daily. 02/16/20 05/03/20  William Dubonnet, NP  azelastine (ASTELIN) 0.1 % nasal spray Place 2 sprays into both nostrils 2 (two) times daily. Use in each nostril as directed 08/30/20   William Haven, MD  cefdinir (OMNICEF) 300 MG capsule Take 300 mg by mouth 2 (two) times daily. 08/23/20   [provider]  dextromethorphan-guaiFENesin (MUCINEX DM) 30-600 MG 12hr tablet Take 1 tablet by mouth 2 (two) times daily as needed for cough. 08/17/20   William Costa, MD  doxycycline (VIBRA-TABS) 100 MG tablet Take 1 tablet (100 mg total) by mouth 2 (two) times daily. 08/16/20   William Balloon, William Taylor  fenofibrate micronized (ANTARA) 130 MG capsule TAKE 1 CAPSULE BY MOUTH  DAILY BEFORE BREAKFAST 03/24/20   William Haven, MD  finasteride (PROSCAR) 5 MG tablet Take 1 tablet  (5 mg total) by mouth daily. 08/04/20   William Taylor, William Fairly, MD  levobunolol (BETAGAN) 0.5 % ophthalmic solution Place 1 drop into the right eye 2 (two) times daily.    [provider]  lisinopril (ZESTRIL) 10 MG tablet Take 1 tablet (10 mg total) by mouth daily. 06/26/20   William Haven, MD  loratadine (CLARITIN) 10 MG tablet Take 1 tablet (10 mg total) by mouth daily. 08/30/20   William Haven, MD  melatonin 3 MG TABS tablet Take by mouth. 07/17/20   [provider]  metoprolol tartrate (LOPRESSOR) 25 MG tablet Take 0.5 tablets (12.5 mg total) by mouth 2 (two) times daily. Cut in 1/2 for pt hold if BP <100/<60. 05/25/20   McLean-Scocuzza, Nino Glow, MD  mirtazapine (REMERON) 15 MG tablet TAKE 1 TABLET BY MOUTH NIGHTLY 07/10/20   William Haven, MD  Multiple Vitamin (MULTIVITAMIN PO) Take 1 tablet by mouth daily.    [provider]  nitroGLYCERIN (NITROSTAT) 0.4 MG SL tablet DISSOLVE UNDER THE TONGUE 1 TABLET  EVERY 5 MINUTES AS NEEDED FOR CHEST PAIN 07/31/20   [name], MD  pantoprazole (PROTONIX) 40 MG tablet TAKE ONE TABLET BY MOUTH TWICE DAILY 08/25/19   William Bellows, MD  polyethylene glycol (MIRALAX / GLYCOLAX) 17 g packet Take 17 g by mouth daily as needed.    [provider]  pravastatin (PRAVACHOL) 40 MG tablet TAKE 1 TABLET BY MOUTH AT  BEDTIME 05/24/20   William Haven, MD  Rivaroxaban (XARELTO) 15 MG TABS tablet Take 1 tablet (15 mg total) by mouth daily. 05/24/20   [name], MD  silodosin (RAPAFLO) 4 MG CAPS capsule TAKE 1 CAPSULE BY MOUTH EVERY DAY WITH BREAKFAST 08/10/20   McGowan, Hunt Oris, PA-C   Family History    Family History  Problem Relation Age of Onset  . Stroke Mother   . Colon cancer Father   . Coronary artery disease Brother   . Other Brother 55       lymes dz    Social History    Social History   Socioeconomic History  . Marital status: Married    Spouse name: Not on file  . Number of children: Not  on file  . Years of education: Not on file  . Highest education level: Not on file  Occupational History  . Occupation: retired   Tobacco Use  . Smoking status: Never Smoker  . Smokeless tobacco: Never Used  Vaping Use  . Vaping Use: Never used  Substance and Sexual Activity  . Alcohol use: No  . Drug use: No  . Sexual activity: Not Currently  Other Topics Concern  . Not on file  Social History Narrative  . Not on file   Social Determinants of Health   Financial Resource Strain: Not on file  Food Insecurity: Not on file  Transportation Needs: Not on file  Physical Activity: Not on file  Stress: Not on file  Social Connections: Not on file  Intimate Partner Violence: Not on file    Review of Systems    As above, he has been experiencing generalized malaise with dyspnea, and chest pressure that has been constant and worsens with exertion.  He has noted elevations in heart rates.  Reports that a few weeks ago, he had a sore throat and tested negative for strep and COVID.  He denies PND, orthopnea, dizziness, syncope, edema, or early satiety.  All other systems reviewed and are otherwise negative except as noted above.  Physical Exam    VS:  BP 140/80 (BP Location: Left Arm, Patient Position: Sitting, Cuff Size: Normal)   Pulse (!) 103   Ht 6\' 4"  (1.93 m)   Wt 212 lb (96.2 kg)   SpO2 98%   BMI 25.81 kg/m  , BMI Body mass index is 25.81 kg/m. GEN: Well nourished, well developed, in no acute distress. HEENT: normal. Neck: Supple, no JVD, carotid bruits, or masses. Cardiac: RRR, tachycardic, no murmurs, rubs, or gallops. No clubbing, cyanosis, edema.  Radials/DP/PT 1+ and equal bilaterally.  Respiratory:  Respirations regular and unlabored, clear to auscultation bilaterally. GI: Soft, nontender, nondistended, BS + x 4. MS: no deformity or atrophy. Skin: warm and dry, no rash. Neuro:  Strength and sensation are intact. Psych: Normal affect.  Accessory Clinical Findings     ECG personally reviewed by me today -sinus tachycardia, 103, left axis deviation, inferior infarct- no acute changes.  Lab Results  Component Value Date   WBC 7.6 08/16/2020   HGB 10.8 (L)  08/16/2020   HCT 30.9 (L) 08/16/2020   MCV 100.3 (H) 08/16/2020   PLT 264 08/16/2020   Lab Results  Component Value Date   CREATININE 1.35 (H) 08/16/2020   BUN 27 (H) 08/16/2020   NA 141 08/16/2020   K 4.0 08/16/2020   CL 106 08/16/2020   CO2 25 08/16/2020   Lab Results  Component Value Date   ALT 13 08/27/2019   AST 26 08/27/2019   ALKPHOS 44 08/27/2019   BILITOT 1.2 08/27/2019   Lab Results  Component Value Date   CHOL 110 06/28/2016   HDL 29.90 (L) 06/28/2016   LDLCALC 64 06/28/2016   TRIG 80.0 06/28/2016   CHOLHDL 4 06/28/2016    Lab Results  Component Value Date   HGBA1C 5.5 09/16/2018    Assessment & Plan    1.  Unstable angina/coronary artery disease: History of CAD stating back to 2005 with stenting in the LAD at that time and subsequent PCI and drug-eluting stent placement to the OM 2 in 2012.  Most recent diagnostic catheterization 2015 showed stable anatomy with small vessel/branch disease.  He has a history of atypical chest pain with negative stress test in 2020 and most recently December 2021.  Unfortunately, he presents to clinic today with a 2-day history of generalized malaise, constant chest pressure that is worse with exertion, dyspnea on exertion, and elevated heart rates.  ECG today shows sinus tachycardia without acute ST or T changes.  I am concerned about his chest pain and also about potential other underlying issues given sinus tachycardia.  He does have history of PE though is chronically anticoagulated with Xarelto and has not missed any doses.  I will have staff we have him over to the emergency department now for further laboratory and radiologic evaluation.  2.  Paroxysmal atrial fibrillation: Maintaining sinus rhythm, though he is tachycardic.  He is  on amiodarone, beta-blocker, and Xarelto therapy.  3.  Essential hypertension: Blood pressure elevated today in the setting of feeling poorly.  No medication adjustments at this time and is he will go to the ER for evaluation.  4.  Hyperlipidemia: LDL of 64 on pravastatin therapy.  5.  History of pulmonary embolism: With chest pain and sinus tachycardia, this is of potential concern however, he is on chronic Xarelto and has not missed any doses.  6.  Sinus tachycardia: Broad differential in the setting of above.  In early January, he was dealing with sore throat but tested negative for COVID and strep.  He has not been having any fevers, chills, cough, but does note generalized malaise.  Also chest pain as outlined above.  Further labs and x-ray evaluation in the ED.    7.  Disposition: Patient to ED now for further evaluation given ongoing chest pain, malaise, and sinus tachycardia.  Clinic follow-up to be determined post discharge.  Murray Hodgkins, NP 09/08/2020, 5:00 PM

## 2020-09-08 NOTE — Telephone Encounter (Signed)
Line busy when called for scheduled awv. Will follow for completion within the scheduled timeframe or reschedule.

## 2020-09-08 NOTE — Telephone Encounter (Signed)
Patient c/o Palpitations:  High priority if patient c/o lightheadedness, shortness of breath, or chest pain  1) How long have you had palpitations/irregular HR/ Afib? Are you having the symptoms now? A couple days  2) Are you currently experiencing lightheadedness, SOB or CP? Feels like pressure in chest  3) Do you have a history of afib (atrial fibrillation) or irregular heart rhythm? Yes   4) Have you checked your BP or HR? (document readings if available): today 127/107 HR 118  5) Are you experiencing any other symptoms?  no

## 2020-09-08 NOTE — ED Notes (Signed)
Repeat trop sent to lab at this time.

## 2020-09-11 IMAGING — DX DG KNEE COMPLETE 4+V*L*
4 series · 4 of 4 positions shown · non-contrast
Comparison: No recent prior.

CLINICAL DATA: Left knee pain.

EXAM:
LEFT KNEE - COMPLETE 4+ VIEW

[knee standing ap]
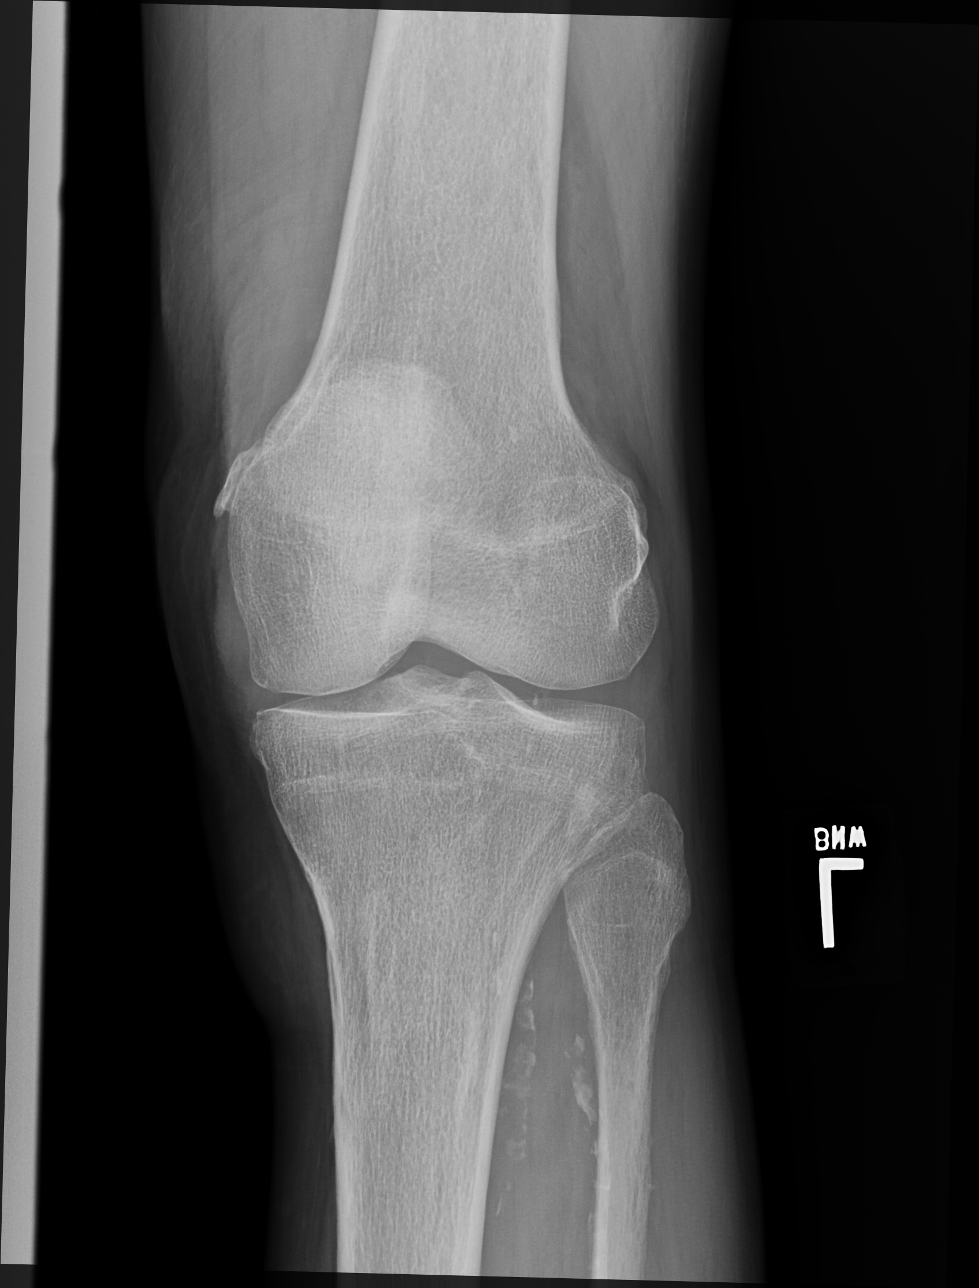

[knee standing external ap]
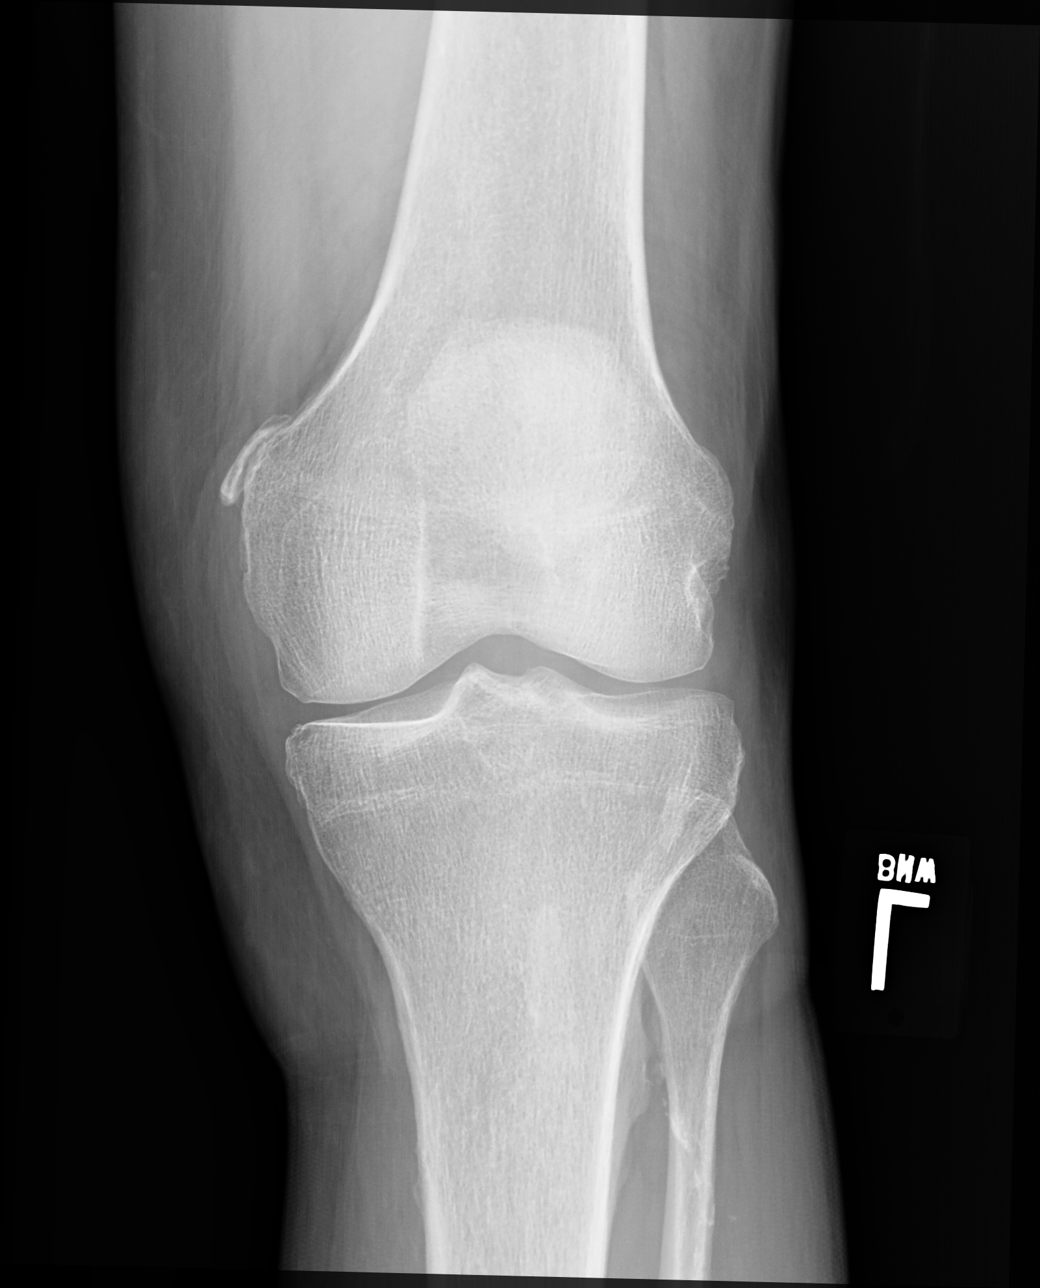

[knee standing internal ap]
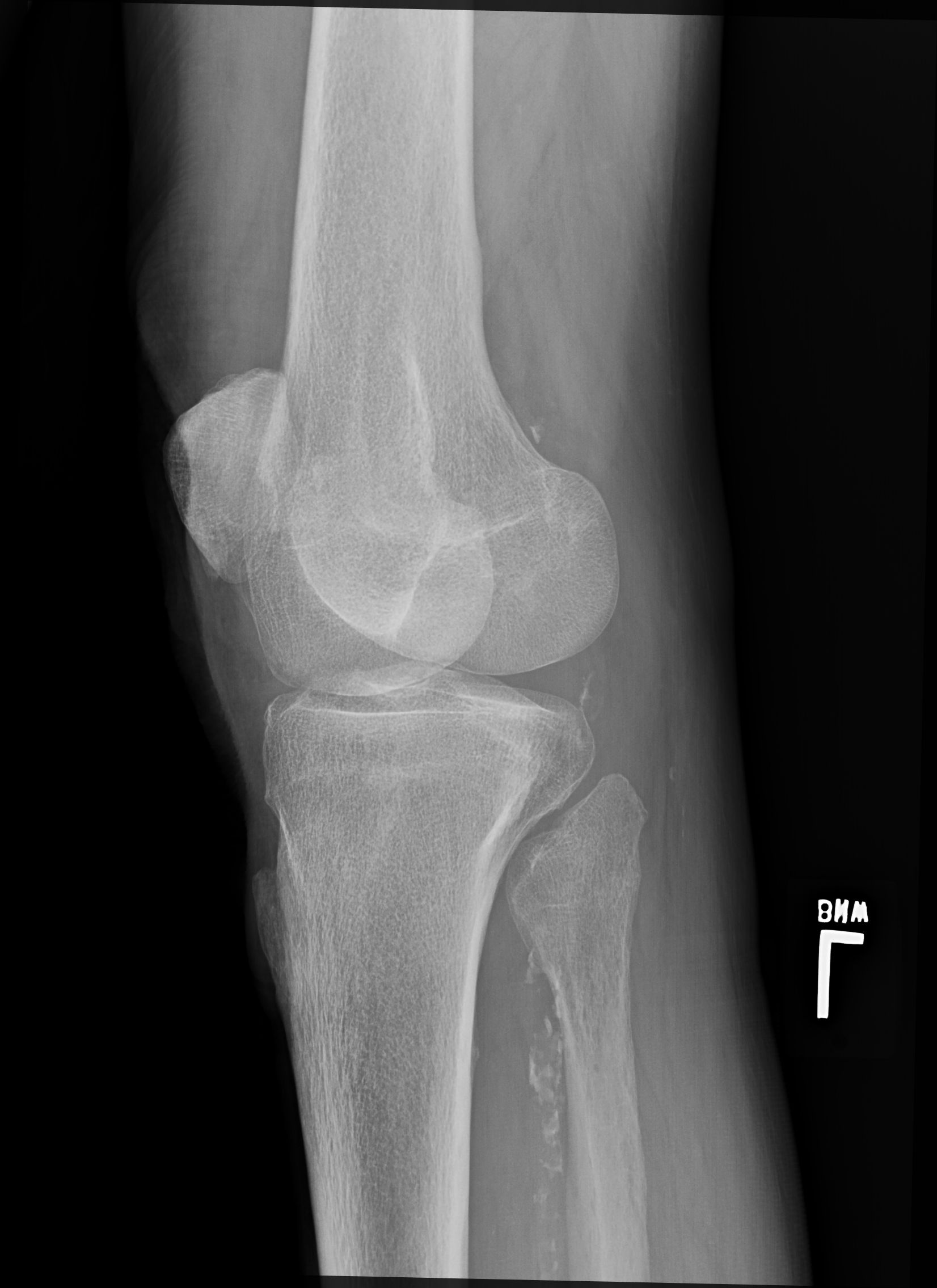

[knee standing lat]
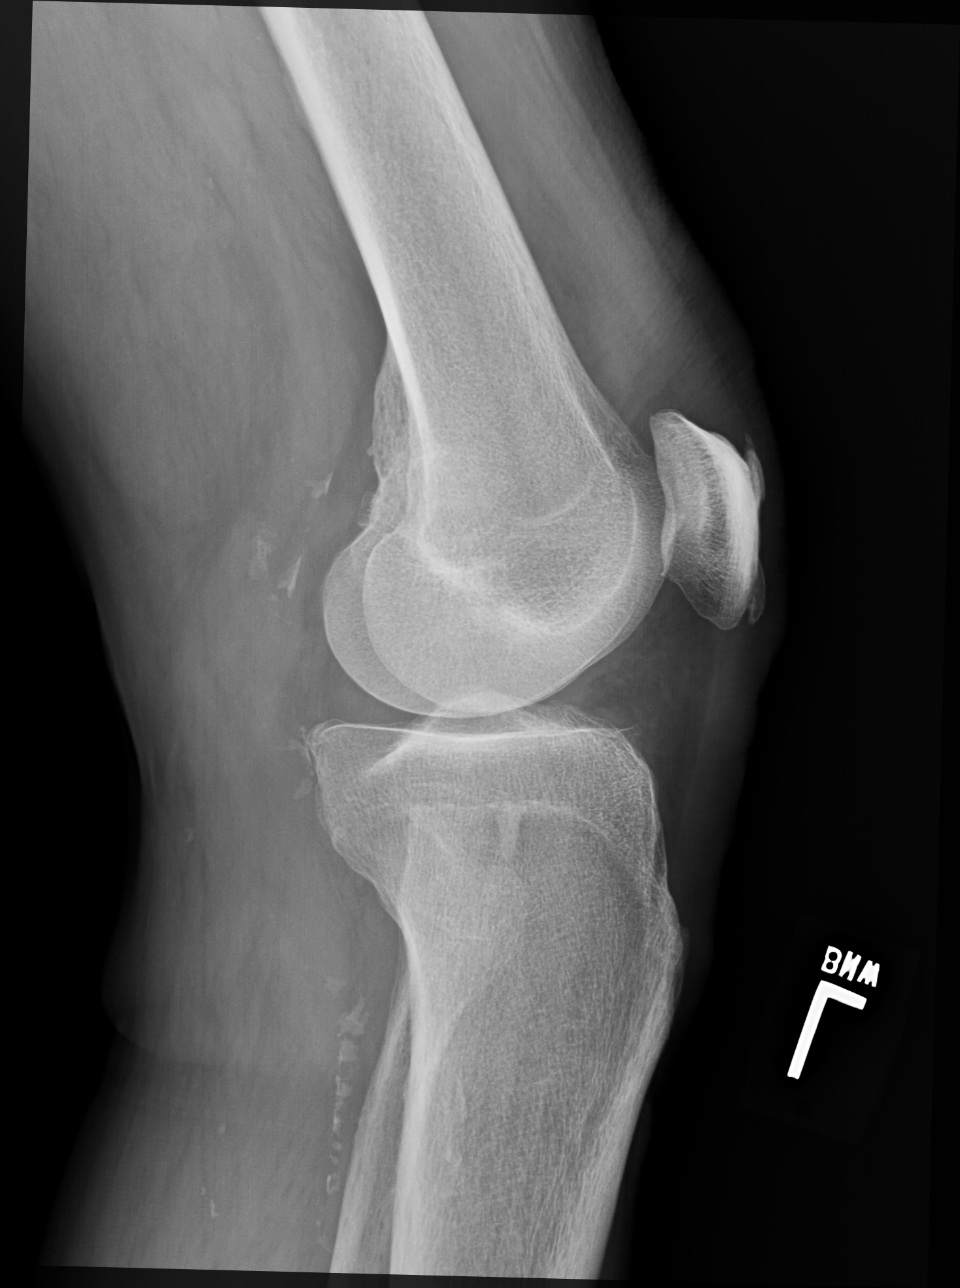

[4 of 4 positions shown; findings below may reference images not displayed]

FINDINGS: Moderate tricompartment degenerative change. No acute bony or joint
abnormality. Peripheral vascular calcification.
IMPRESSION: 1. Moderate tricompartment degenerative change. No acute bony
abnormality.

2.  Peripheral vascular disease.

## 2020-09-11 NOTE — Progress Notes (Signed)
Cardiology Office Note:    Date:  09/13/2020   ID:  TOSH DANESH, DOB 04/10/1930, MRN [id number]  PCP:  [name], MD  [facility] Cardiologist:  Ida Rogue, MD  [facility] Electrophysiologist:  None   Referring MD: [name], MD   Chief Complaint: ER f/u  History of Present Illness:    JURIAH LANAHAN is a 87 y.o. male with a hx of CAD s/p LAD and OM2 stenting, paroxysmal afib on Xarelto, pulmonary embolism (2006, 2012), atypical chest pain, cold agglutinin hemolytic anemia, orthostatic hypotension, prostate cancer, hypertension, and HLD who is here for hospital follow-up.   The patient previously underwent stentin gof the LAD in 2005. Diagnostic cath in 2009 showed a patent LAD stent with otherwise nonobstructive disease. He was diagnosed with afib in September 2011 int he setting of a UTI and had recurrent afib. He is on Xarelto for a/c and is on BB and amiodarone. Diagnostic cath in 2012 showed 90% OM two stenosis and underwent a DES placement at [facility]. Following cath he had flank pain and was diagnosed with PE with heavy clot burden. Xarelto was continued. Catheterization in March 2015 showed moderate diffuse disease with 90% stenosis in the first diagonal and an 80% stenosis int eh first obtuse marginal. He has been medically managed since then and he underwent stress testing in November 2020 which was low risk.   He has a history of orthostasis and has been on florinef, midodrine, and droxidopa, which was eventually discontinued September 2021 for hypertension.   He was seen in the ER December 2021 for atypical chest pain and flat troponins. He underwent stress testing which showed no evidence of ischemia and an EF or 77%.   He was seen in the office 1/21 and he reported chest pain and was tachycardic. He was sent to the ER for further work-up.   ED work-up showed troponin negative x 2. No PE on CT. EKG was reassuring. Case discussed with  cardiology who recommended outpatient follow-up.   Today, the patient reports he is regularly tired. This has been going on for a couple months. It's not worse. Also feels sob worse on exertion, also has been going on the last couple months.he is able to walk around his house and go grocery shopping. Has occasional sharp chest pain. This is left sided and only lasts a couple seconds. Not worse with exertion. No lower leg edema, orthopnea, pnd, fever, chills. Has some dizziness upon standing or going from bending to standing. This happened a couple days ago. BP average 150/100-110. He has a history of orthostatic hypotension and has been on medication in the past for this. He is not on antihypertensives. No nausea or vomiting. Reports eating and drinking normally. He gets up regularly to use the bathroom and has trouble getting back to sleep. Does not take anything for sleep. He is in sinus rhythm today.   Past Medical History:  Diagnosis Date  . Cancer [facility])    Prostate, followed by Dr. Jacqlyn Larsen  . Colon polyp   . Coronary artery disease    a. 2005 s/p PCI LAD ([facility]); b 2012 s/p PCI OM2; c.10/2013 Cath: LAD 21m ISR, 57m, D1 90, D2 70, OM2 80 w/ patent stent;  d. 06/2019 Low risk MV; e. 07/2020 MV: EF 77%, no ischemia/infarct.  [place] stones   . Hyperlipidemia   . Hypertension   . MI (myocardial infarction) ([place])    2015  . Orthostatic  hypotension    a. prev on midodrine/florinef/northera.  Marland Kitchen PAF (paroxysmal atrial fibrillation) (HCC)    a. on xarelto and amio (CHA2DS2VASc = 4).  . Skin cancer     Past Surgical History:  Procedure Laterality Date  . CARDIAC CATHETERIZATION  10/2013   [facility]  . CATARACT EXTRACTION  Oct. 3, 2012   right eye  . colonoscopy    . COLONOSCOPY W/ POLYPECTOMY  2015   Dr Rayann Heman  . COLONOSCOPY WITH PROPOFOL N/A 08/15/2016   Procedure: COLONOSCOPY WITH PROPOFOL;  Surgeon: [name], MD;  Location: [facility] ENDOSCOPY;  Service: Endoscopy;  Laterality: N/A;  . CORONARY  ANGIOPLASTY  2009   2005; s/p stent  . ENTEROSCOPY N/A 09/22/2018   Procedure: ENTEROSCOPY;  Surgeon: Lin Landsman, MD;  Location: [place];  Service: Gastroenterology;  Laterality: N/A;  . ESOPHAGOGASTRODUODENOSCOPY (EGD) WITH PROPOFOL N/A 08/15/2016   Procedure: ESOPHAGOGASTRODUODENOSCOPY (EGD) WITH PROPOFOL;  Surgeon: [name], MD;  Location: [facility] ENDOSCOPY;  Service: Endoscopy;  Laterality: N/A;  . GIVENS CAPSULE STUDY N/A 09/26/2016   Procedure: GIVENS CAPSULE STUDY;  Surgeon: [name], MD;  Location: [facility] ENDOSCOPY;  Service: Endoscopy;  Laterality: N/A;  . HERNIA REPAIR  2013  . LUMBAR SPINE SURGERY    . UPPER GI ENDOSCOPY  Sept 2015   Dr Rayann Heman    Current Medications: Current Meds  Medication Sig  . amiodarone (PACERONE) 100 MG tablet Take 1 tablet (100 mg total) by mouth daily.  Marland Kitchen azelastine (ASTELIN) 0.1 % nasal spray Place 2 sprays into both nostrils 2 (two) times daily. Use in each nostril as directed  . fenofibrate micronized (ANTARA) 130 MG capsule TAKE 1 CAPSULE BY MOUTH  DAILY BEFORE BREAKFAST  . finasteride (PROSCAR) 5 MG tablet Take 1 tablet (5 mg total) by mouth daily.  Marland Kitchen levobunolol (BETAGAN) 0.5 % ophthalmic solution Place 1 drop into the right eye 2 (two) times daily.  Marland Kitchen loratadine (CLARITIN) 10 MG tablet Take 1 tablet (10 mg total) by mouth daily.  . melatonin 3 MG TABS tablet Take by mouth.  . mirtazapine (REMERON) 15 MG tablet TAKE 1 TABLET BY MOUTH NIGHTLY  . Multiple Vitamin (MULTIVITAMIN PO) Take 1 tablet by mouth daily.  . nitroGLYCERIN (NITROSTAT) 0.4 MG SL tablet DISSOLVE UNDER THE TONGUE 1 TABLET EVERY 5 MINUTES AS NEEDED FOR CHEST PAIN  . pantoprazole (PROTONIX) 40 MG tablet TAKE ONE TABLET BY MOUTH TWICE DAILY  . pravastatin (PRAVACHOL) 40 MG tablet TAKE 1 TABLET BY MOUTH AT  BEDTIME  . Rivaroxaban (XARELTO) 15 MG TABS tablet Take 1 tablet (15 mg total) by mouth daily.     Allergies:   Bee venom, Sulfasalazine, Sulfa antibiotics, and  Warfarin and related   Social History   Socioeconomic History  . Marital status: Married    Spouse name: Not on file  . Number of children: Not on file  . Years of education: Not on file  . Highest education level: Not on file  Occupational History  . Occupation: retired   Tobacco Use  . Smoking status: Never Smoker  . Smokeless tobacco: Never Used  Vaping Use  . Vaping Use: Never used  Substance and Sexual Activity  . Alcohol use: No  . Drug use: No  . Sexual activity: Not Currently  Other Topics Concern  . Not on file  Social History Narrative  . Not on file   Social Determinants of Health   Financial Resource Strain: Not on file  Food Insecurity: Not on file  Transportation Needs: Not on file  Physical Activity: Not on file  Stress: Not on file  Social Connections: Not on file     Family History: The patient's family history includes Colon cancer in his father; Coronary artery disease in his brother; Other (age of onset: 61) in his brother; Stroke in his mother.  ROS:   Please see the history of present illness.     All other systems reviewed and are negative.  EKGs/Labs/Other Studies Reviewed:    The following studies were reviewed today:  Echo 08/2018 Study Conclusions   - Left ventricle: The cavity size was normal. There was mild  concentric hypertrophy. Systolic function was normal. The  estimated ejection fraction was in the range of 55% to 60%. Wall  motion was normal; there were no regional wall motion  abnormalities. Doppler parameters are consistent with abnormal  left ventricular relaxation (grade 1 diastolic dysfunction).  - Aortic valve: There was trivial regurgitation.  - Left atrium: The atrium was normal in size.  - Right ventricle: Systolic function was normal.  - Pulmonary arteries: Systolic pressure could not be accurately  estimated.   Long term monitor Event Monitor 07/2019  avg HR of 85 bpm.  Sinus Rhythm.  6  Supraventricular Tachycardia/atrial tachycardia  runs occurred, the run with the fastest interval lasting 7 beats with a max rate of 197 bpm, the longest lasting 18 beats with an avg rate of 126 bpm.   Isolated SVEs were occasional (1.2%, 19914), SVE Couplets were rare (<1.0%, 14), and no SVE Triplets were present. Isolated VEs were rare (<1.0%), VE Couplets were rare (<1.0%), and no VE Triplets were present.  Patient triggered events were not associated with significant arrhythmia.  Signed, Esmond Plants, MD, Ph.D [facility]    EKG:  EKG is  ordered today.  The ekg ordered today demonstrates NSR, 94 bpm, QTc 446ms, nonspecific T wave changes.   Recent Labs: 08/16/2020: B Natriuretic Peptide 41.2 09/08/2020: BUN 29; Creatinine, Ser 1.43; Hemoglobin 11.6; Platelets 275; Potassium 4.5; Sodium 142  Recent Lipid Panel    Component Value Date/Time   CHOL 110 06/28/2016 1106   CHOL 119 10/27/2013 0609   TRIG 80.0 06/28/2016 1106   TRIG 61 10/27/2013 0609   HDL 29.90 (L) 06/28/2016 1106   HDL 38 (L) 10/27/2013 0609   CHOLHDL 4 06/28/2016 1106   VLDL 16.0 06/28/2016 1106   VLDL 12 10/27/2013 0609   LDLCALC 64 06/28/2016 1106   LDLCALC 69 10/27/2013 0609     Risk Assessment/Calculations:    [id number] Score = 4  {Click here to calculate score.  REFRESH note before signing.       :[id number]}EXHB indicates a 4.8% annual risk of stroke. The patient's score is based upon: CHF History: No HTN History: Yes Diabetes History: No Stroke History: No Vascular Disease History: Yes Age Score: 2 Gender Score: 0      Physical Exam:    VS:  BP 120/80 (BP Location: Left Arm, Patient Position: Sitting, Cuff Size: Normal)   Pulse 94   Ht 6\' 4"  (1.93 m)   Wt 212 lb (96.2 kg)   SpO2 97%   BMI 25.81 kg/m     Wt Readings from Last 3 Encounters:  09/13/20 212 lb (96.2 kg)  09/08/20 212 lb (96.2 kg)  08/30/20 210 lb 9.6 oz (95.5 kg)     GEN: Well nourished, well developed in  no acute distress HEENT: Normal NECK: No JVD; No carotid bruits LYMPHATICS: No lymphadenopathy  CARDIAC: RRR, + murmurs, no rubs, gallops RESPIRATORY:  Clear to auscultation without rales, wheezing or rhonchi  ABDOMEN: Soft, non-tender, non-distended MUSCULOSKELETAL:  No edema; No deformity  SKIN: Warm and dry NEUROLOGIC:  Alert and oriented x 3 PSYCHIATRIC:  Normal affect   ASSESSMENT:    1. Atypical angina (HCC)   2. Paroxysmal atrial fibrillation ([place])   3. Essential hypertension   4. Hyperlipidemia LDL goal <70   5. Sinus tachycardia   6. Orthostatic hypotension   7. Murmur   8. History of pulmonary embolism   9. Heart murmur   10. Coronary artery disease of native artery of native heart with stable angina pectoris ([place])    PLAN:    In order of problems listed above:  Atypical angina with history of CAD with prior stenting He had stenting of the LAD in 2005 with stenting and stenting to the [place] in 2012. Most recent diagnostic cath 2015 showed stable anatomy with small vessel/branch disease. Echo 08/2018 show LVEF 55-60%, no WMA, G1DD. He had a recent [place] 07/2020 showed EF 77%, with no significant ischemia, overall low risk. He has hx of atypical chest pain. At his last office visit he was sent to the ED for chest discomfort and sinus tachycardia. ER work-up was unremarkable, normal troponin, no PE, EKG reassuring. He returns and reports occasional twinges of chest pain, they are atypical and very brief. He still has general fatigue and DOE however this has been unchanged for many months. He is euvolemic on exam. Suspect symptoms mostly noncardiac. No labs today since he had labs last week. He sees his PCP next month at which time he con comment on his symptoms. Appears they ordered TSH.   Paroxysmal Afib Heart rates elevated at the last visit, found to be ST. No afib noted in the ER.Today EKG shows SR with HR in the 90s. Continue amiodarone.   Orthostatic  hypotension/HTN Patient reports blood pressures at home 150/100. Today it is good at 120/80. He has a history of orthostatic hypotension and has been on medication for this in the past. He is not on antihypertensives. He has mild orthostatic symptoms daily with dizziness upon standing or going from bending to standing. Although BP is high, given history and symptoms I am not inclined to change anything.   HLD LDL 64 on pravastatin  Hx of pulmonary embolism ER work-up showed no PE on chest CT. He described chronic DOE that is unchanged. Continue Xarelto.  Sinus Tachycardia No PE on CT as above. Sinus tachycardia resolved. Heart rates at home in the 70s. Today heart rate in the 90s.  Heart murmur Murmur on exam. Will check an echo. Last echo 08/2018 showed LVEF 55-60%, G1Dd, no WMA, normal atrium, no significant valvular abnormalities  Disposition: Follow up in 1 month with MD/APP  Shared Decision Making/Informed Consent   Shared Decision Making/Informed Consent The risks [esophageal damage, perforation (1:10,000 risk), bleeding, pharyngeal hematoma as well as other potential complications associated with conscious sedation including aspiration, arrhythmia, respiratory failure and death], benefits (treatment guidance and diagnostic support) and alternatives of a transesophageal echocardiogram were discussed in detail with Mr. Dobkins and he is willing to proceed.       Signed, Philopater Mucha Ninfa Meeker, PA-C  09/13/2020 11:39 AM    [facility]

## 2020-09-13 ENCOUNTER — Ambulatory Visit: Payer: Medicare HMO | Admitting: Physician Assistant

## 2020-09-13 ENCOUNTER — Encounter: Payer: Self-pay | Admitting: Medical

## 2020-09-13 ENCOUNTER — Ambulatory Visit: Payer: Medicare HMO | Admitting: Medical

## 2020-09-13 ENCOUNTER — Other Ambulatory Visit: Payer: Self-pay

## 2020-09-13 VITALS — BP 120/80 | HR 94 | Ht 76.0 in | Wt 212.0 lb

## 2020-09-13 DIAGNOSIS — I951 Orthostatic hypotension: Secondary | ICD-10-CM

## 2020-09-13 DIAGNOSIS — Z86711 Personal history of pulmonary embolism: Secondary | ICD-10-CM

## 2020-09-13 DIAGNOSIS — E785 Hyperlipidemia, unspecified: Secondary | ICD-10-CM

## 2020-09-13 DIAGNOSIS — I480 Paroxysmal atrial fibrillation: Secondary | ICD-10-CM | POA: Diagnosis not present

## 2020-09-13 DIAGNOSIS — I10 Essential (primary) hypertension: Secondary | ICD-10-CM | POA: Diagnosis not present

## 2020-09-13 DIAGNOSIS — I208 Other forms of angina pectoris: Secondary | ICD-10-CM | POA: Diagnosis not present

## 2020-09-13 DIAGNOSIS — R011 Cardiac murmur, unspecified: Secondary | ICD-10-CM

## 2020-09-13 DIAGNOSIS — R000 Tachycardia, unspecified: Secondary | ICD-10-CM

## 2020-09-13 DIAGNOSIS — I25118 Atherosclerotic heart disease of native coronary artery with other forms of angina pectoris: Secondary | ICD-10-CM

## 2020-09-13 NOTE — Patient Instructions (Signed)
Medication Instructions:  Your physician recommends that you continue on your current medications as directed. Please refer to the Current Medication list given to you today.  *If you need a refill on your cardiac medications before your next appointment, please call your pharmacy*   Lab Work: None ordered    Testing/Procedures:  Your physician has requested that you have an echocardiogram. Echocardiography is a painless test that uses sound waves to create images of your heart. It provides your doctor with information about the size and shape of your heart and how well your heart's chambers and valves are working. This procedure takes approximately one hour. There are no restrictions for this procedure.    Follow-Up: At [facility], you and your health needs are our priority.  As part of our continuing mission to provide you with exceptional heart care, we have created designated Provider Care Teams.  These Care Teams include your primary Cardiologist (physician) and Advanced Practice Providers (APPs -  Physician Assistants and Nurse Practitioners) who all work together to provide you with the care you need, when you need it.  We recommend signing up for the patient portal called "MyChart".  Sign up information is provided on this After Visit Summary.  MyChart is used to connect with patients for Virtual Visits (Telemedicine).  Patients are able to view lab/test results, encounter notes, upcoming appointments, etc.  Non-urgent messages can be sent to your provider as well.   To learn more about what you can do with MyChart, go to [email or web address].    Your next appointment:   1 month(s)  The format for your next appointment:   In Person  Provider:   You may see Ida Rogue, MD or one of the following Advanced Practice Providers on your designated Care Team:    Murray Hodgkins, NP  Christell Faith, PA-C  Marrianne Mood, PA-C  Cadence [place], [place]  Laurann Montana,  NP

## 2020-09-14 NOTE — Addendum Note (Signed)
Addended by: Ronaldo Miyamoto on: 09/14/2020 09:50 AM   Modules accepted: Orders

## 2020-09-18 ENCOUNTER — Other Ambulatory Visit: Payer: Self-pay | Admitting: Gastroenterology

## 2020-09-19 NOTE — Telephone Encounter (Signed)
Is this okay to refill? Last OV: 02/15/19

## 2020-09-19 NOTE — Telephone Encounter (Signed)
Yes ok to refill with 3 refills

## 2020-09-26 ENCOUNTER — Encounter: Payer: Self-pay | Admitting: Family Medicine

## 2020-09-26 ENCOUNTER — Other Ambulatory Visit: Payer: Self-pay

## 2020-09-26 ENCOUNTER — Ambulatory Visit: Payer: Medicare HMO | Admitting: Family Medicine

## 2020-09-26 ENCOUNTER — Telehealth: Payer: Self-pay | Admitting: Family Medicine

## 2020-09-26 VITALS — BP 152/78 | HR 101 | Temp 98.2°F | Ht 76.0 in | Wt 211.0 lb

## 2020-09-26 DIAGNOSIS — I951 Orthostatic hypotension: Secondary | ICD-10-CM

## 2020-09-26 DIAGNOSIS — I10 Essential (primary) hypertension: Secondary | ICD-10-CM | POA: Diagnosis not present

## 2020-09-26 DIAGNOSIS — K219 Gastro-esophageal reflux disease without esophagitis: Secondary | ICD-10-CM

## 2020-09-26 DIAGNOSIS — Z86711 Personal history of pulmonary embolism: Secondary | ICD-10-CM | POA: Diagnosis not present

## 2020-09-26 DIAGNOSIS — I480 Paroxysmal atrial fibrillation: Secondary | ICD-10-CM | POA: Diagnosis not present

## 2020-09-26 LAB — HEPATIC FUNCTION PANEL
ALT: 11 U/L (ref 0–53)
AST: 22 U/L (ref 0–37)
Albumin: 4.4 g/dL (ref 3.5–5.2)
Alkaline Phosphatase: 30 U/L — ABNORMAL LOW (ref 39–117)
Bilirubin, Direct: 0.4 mg/dL — ABNORMAL HIGH (ref 0.0–0.3)
Total Bilirubin: 1.5 mg/dL — ABNORMAL HIGH (ref 0.2–1.2)
Total Protein: 6.8 g/dL (ref 6.0–8.3)

## 2020-09-26 LAB — TSH: TSH: 6.58 u[IU]/mL — ABNORMAL HIGH (ref 0.35–4.50)

## 2020-09-26 NOTE — Assessment & Plan Note (Signed)
History of this in the past.  He is on Xarelto.  Recently PE was ruled out as a cause of his symptoms with CT angiogram.

## 2020-09-26 NOTE — Telephone Encounter (Signed)
-----   Message from [name], MD sent at 09/26/2020  1:35 PM EST ----- Hi Noted request by patient below.  I think he would do fine on the lower dose of PPI or an alternative could be famotidine 40 mg once a day.  The reason that he needs to be on at least 1 of these medications is because of his hiatal hernia which has caused him to have an anemia in the past.  The hiatal hernia can sometimes cause chronic bleeding through [facility]  ulcers.  Hence he would need to be on either a low-dose PPI or H2 receptor blocker long-term.  I am okay with either  Do let me know if you have any other concerns.  Kind regards  Kiran  ----- Message ----- From: [name], MD Sent: 09/26/2020  11:31 AM EST To: [name], MD  Delene Ruffini,  I saw Mr Budai for follow-up today. He was previously seeing you for GERD/history of a GI bleed, though it looks like he has not seen you in some time. He asked today about stopping his protonix and I wanted to get your thoughts on this given his prior GI bleed history. He has not had any abdominal pain, reflux, or blood in his stool. I thought about decreasing his dosing to once a day to see how he does to start. Please let me know what you think and I can always get him set up to follow-up with you if needed. Thanks for your help.  Randall Hiss

## 2020-09-26 NOTE — Patient Instructions (Signed)
Nice to see you. I will check with your GI physician regarding your Protonix dose.  We will contact you when I hear back from them. We will get labs today. Please keep your scheduled echo with cardiology.

## 2020-09-26 NOTE — Telephone Encounter (Addendum)
I called and spoke with the patient and informed him the  Provider spoke with his GI provider and he is to take the Protonix once daily and it was ordered and sent to [facility].  Kycen Spalla,cma

## 2020-09-26 NOTE — Assessment & Plan Note (Addendum)
Currently asymptomatic.  He has been on Protonix twice daily for quite some time now.  I will check with his GI physician to see about tapering down on this.

## 2020-09-26 NOTE — Assessment & Plan Note (Signed)
Continues to have some intermittent symptoms.  He will monitor.  He is no longer on medication for this.

## 2020-09-26 NOTE — Addendum Note (Signed)
Addended by: Fulton Mole D on: 09/26/2020 02:51 PM   Modules accepted: Orders

## 2020-09-26 NOTE — Assessment & Plan Note (Signed)
Adequately controlled given his history of orthostasis.  He will monitor.

## 2020-09-26 NOTE — Progress Notes (Signed)
Tommi Rumps, MD Phone: [phone]  William Taylor is a 87 y.o. male who presents today for f/u.  A. fib/hypertension/chest pain: Patient was seen in the emergency department several weeks ago for chest pain.  He had work-up with negative troponins and a negative CT angiogram chest.  He notes he had not been feeling well and had been having shortness of breath and chest pain.  His CBC was stable and his kidney function was stable.  He followed up with cardiology subsequently and they have planned for an echo.  He notes occasional palpitations with increased heart rate into the 120s.  He is on amiodarone and Xarelto.  No bleeding issues.  His blood pressures have been more stable at about 140/80.  Does continue to get lightheaded intermittently when he bends over.  Cardiology did not feel that he needed any additional medication based on review of their note for his blood pressure.  Does continue to have dyspnea on exertion.  Last had chest pain last night that resolved with nitroglycerin.  He did have a low risk stress test on 08/17/2020.   GERD: Patient has been on Protonix twice daily for years.  He notes he got a letter from his insurance company that suggested that he get off of this.  He notes no reflux.  No blood in his stool.  No abdominal pain.  He takes Protonix twice daily.  He has followed with GI for this and does have a history of a GI bleed.  Social History   Tobacco Use  Smoking Status Never Smoker  Smokeless Tobacco Never Used    Current Outpatient Medications on File Prior to Visit  Medication Sig Dispense Refill  . amiodarone (PACERONE) 100 MG tablet Take 1 tablet (100 mg total) by mouth daily. 90 tablet 3  . azelastine (ASTELIN) 0.1 % nasal spray Place 2 sprays into both nostrils 2 (two) times daily. Use in each nostril as directed 30 mL 1  . fenofibrate micronized (ANTARA) 130 MG capsule TAKE 1 CAPSULE BY MOUTH  DAILY BEFORE BREAKFAST 90 capsule 3  . finasteride  (PROSCAR) 5 MG tablet Take 1 tablet (5 mg total) by mouth daily. 90 tablet 3  . levobunolol (BETAGAN) 0.5 % ophthalmic solution Place 1 drop into the right eye 2 (two) times daily.    Marland Kitchen loratadine (CLARITIN) 10 MG tablet Take 1 tablet (10 mg total) by mouth daily. 30 tablet 1  . melatonin 3 MG TABS tablet Take by mouth.    . mirtazapine (REMERON) 15 MG tablet TAKE 1 TABLET BY MOUTH NIGHTLY 30 tablet 3  . Multiple Vitamin (MULTIVITAMIN PO) Take 1 tablet by mouth daily.    . nitroGLYCERIN (NITROSTAT) 0.4 MG SL tablet DISSOLVE UNDER THE TONGUE 1 TABLET EVERY 5 MINUTES AS NEEDED FOR CHEST PAIN 25 tablet 0  . pantoprazole (PROTONIX) 40 MG tablet TAKE ONE TABLET BY MOUTH TWICE DAILY 60 tablet 5  . pravastatin (PRAVACHOL) 40 MG tablet TAKE 1 TABLET BY MOUTH AT  BEDTIME 90 tablet 3  . Rivaroxaban (XARELTO) 15 MG TABS tablet Take 1 tablet (15 mg total) by mouth daily. 90 tablet 1   No current facility-administered medications on file prior to visit.     ROS see history of present illness  Objective  Physical Exam Vitals:   09/26/20 0958  BP: (!) 152/78  Pulse: (!) 101  Temp: 98.2 F (36.8 C)  SpO2: 99%    BP Readings from Last 3 Encounters:  09/26/20 (!) 152/78  09/13/20 120/80  09/08/20 (!) 160/99   Wt Readings from Last 3 Encounters:  09/26/20 211 lb (95.7 kg)  09/13/20 212 lb (96.2 kg)  09/08/20 212 lb (96.2 kg)    Physical Exam Constitutional:      General: He is not in acute distress.    Appearance: He is not diaphoretic.  Cardiovascular:     Rate and Rhythm: Normal rate and regular rhythm.     Heart sounds: Normal heart sounds.  Pulmonary:     Effort: Pulmonary effort is normal.     Breath sounds: Normal breath sounds.  Chest:     Chest wall: No tenderness.  Musculoskeletal:        General: No edema.     Right lower leg: No edema.     Left lower leg: No edema.  Skin:    General: Skin is warm and dry.  Neurological:     Mental Status: He is alert.       Assessment/Plan: Please see individual problem list.  Problem List Items Addressed This Visit    GERD (gastroesophageal reflux disease)    Currently asymptomatic.  He has been on Protonix twice daily for quite some time now.  I will check with his GI physician to see about tapering down on this.      History of pulmonary embolism    History of this in the past.  He is on Xarelto.  Recently PE was ruled out as a cause of his symptoms with CT angiogram.      Hypertension    Adequately controlled given his history of orthostasis.  He will monitor.      Orthostatic hypotension    Continues to have some intermittent symptoms.  He will monitor.  He is no longer on medication for this.      Paroxysmal A-fib (HCC) - Primary    Sinus rhythm today.  I wonder if he is going in and out of A. fib to contribute to his symptoms.  He will proceed with echo through cardiology.  If no cause is found on echo for his symptoms he may need a ZIO monitor.      Relevant Orders   TSH   Hepatic function panel      This visit occurred during the SARS-CoV-2 public health emergency.  Safety protocols were in place, including screening questions prior to the visit, additional usage of staff PPE, and extensive cleaning of exam room while observing appropriate contact time as indicated for disinfecting solutions.   I have spent 45 minutes in the care of this patient regarding history taking, documentation, physical exam, chart review, placing orders, discussion of plan with patient, communication with GI.   Tommi Rumps, MD [place]

## 2020-09-26 NOTE — Assessment & Plan Note (Signed)
Sinus rhythm today.  I wonder if he is going in and out of A. fib to contribute to his symptoms.  He will proceed with echo through cardiology.  If no cause is found on echo for his symptoms he may need a ZIO monitor.

## 2020-09-26 NOTE — Telephone Encounter (Signed)
Please let the patient know that I heard back from his GI physician.  They recommended that he stay on a low dose of the Protonix.  They noted an alternative would be to have him take famotidine 40 mg once daily.  The reasoning behind needing to be on 1 of these medicines is his hiatal hernia which contributed to his GI bleed previously.  I would suggest that we decrease the Protonix to once daily and see how he does with that.  Thanks.

## 2020-09-28 ENCOUNTER — Ambulatory Visit (INDEPENDENT_AMBULATORY_CARE_PROVIDER_SITE_OTHER): Payer: Medicare HMO

## 2020-09-28 ENCOUNTER — Other Ambulatory Visit: Payer: Self-pay

## 2020-09-28 DIAGNOSIS — R011 Cardiac murmur, unspecified: Secondary | ICD-10-CM

## 2020-09-28 LAB — ECHOCARDIOGRAM COMPLETE
AR max vel: 3.76 cm2
AV Area VTI: 3.26 cm2
AV Area mean vel: 3.86 cm2
AV Mean grad: 4 mmHg
AV Peak grad: 7.5 mmHg
Ao pk vel: 1.37 m/s
Area-P 1/2: 3.17 cm2
Calc EF: 56.1 %
S' Lateral: 3.1 cm
Single Plane A2C EF: 55.4 %
Single Plane A4C EF: 57.1 %

## 2020-10-02 ENCOUNTER — Telehealth: Payer: Self-pay | Admitting: *Deleted

## 2020-10-02 DIAGNOSIS — I517 Cardiomegaly: Secondary | ICD-10-CM

## 2020-10-02 DIAGNOSIS — R000 Tachycardia, unspecified: Secondary | ICD-10-CM

## 2020-10-02 NOTE — Telephone Encounter (Signed)
The patient has been notified of the result and verbalized understanding.  All questions (if any) were answered. Patient agreeable to Cardiac MRI. Order entered and message sent to [facility].

## 2020-10-02 NOTE — Telephone Encounter (Signed)
-----   Message from [place], PA-C sent at 09/29/2020  1:30 PM EST ----- Please call patient and inform him the echo showed normal pump function but some of the heart walls were thickened and it was recommended we do further testing, recommending cardiac MRI.  Please schedule patient for cardiac MRI.  Thanks!

## 2020-10-03 ENCOUNTER — Telehealth: Payer: Self-pay | Admitting: Medical

## 2020-10-03 ENCOUNTER — Other Ambulatory Visit: Payer: Self-pay | Admitting: Family Medicine

## 2020-10-03 DIAGNOSIS — R17 Unspecified jaundice: Secondary | ICD-10-CM

## 2020-10-03 DIAGNOSIS — R7989 Other specified abnormal findings of blood chemistry: Secondary | ICD-10-CM

## 2020-10-03 NOTE — Telephone Encounter (Signed)
Left message for patient to call and discuss preferred weekdays and times for scheduling the Cardiac MRI that was ordered by Ed Blalock, PA

## 2020-10-05 NOTE — Telephone Encounter (Signed)
Called to discuss preferred weekdays and times for scheduling the Cardiac MRI ordered by Cadence Kathlen Mody, PA---informed patient as soon as we hear regarding the insurance prior authorization--I will be in touch with the appointment information.

## 2020-10-06 NOTE — Telephone Encounter (Signed)
Will route back to [place] to get scheduled. Thank you!

## 2020-10-06 NOTE — Telephone Encounter (Signed)
William Taylor is returning William Taylor's call in regards to his MRI. He states any date and time available works for him and he has received confirmation that the prior authorization has been approved. He is requesting the MRI be scheduled and he be called with the date and time. If you are not able to get an answer he is requesting a MyChart message be sent with the information and he will callback if it doesn't work for him. Please advise.

## 2020-10-09 ENCOUNTER — Encounter: Payer: Self-pay | Admitting: Medical

## 2020-10-09 ENCOUNTER — Other Ambulatory Visit: Payer: Self-pay | Admitting: Cardiovascular Disease

## 2020-10-09 NOTE — Telephone Encounter (Signed)
Left message for patient to call and discuss the 11/10/20 Cardiac MRI appointment at [facility]---arrival time is 7:30 am --1st floor admissions office for an 8:00 am start time.  Will continue to try and reach patient by phone.

## 2020-10-09 NOTE — Telephone Encounter (Signed)
Patient aware of 11/10/20 8:00 am Cardiac MRI appt---will mail information to patient.

## 2020-10-10 NOTE — Telephone Encounter (Signed)
Scr 1.43 09/08/20 , 95.7kg, 63m, ccr 48, lovw/furth 09/13/20

## 2020-10-10 NOTE — Telephone Encounter (Signed)
Refill request

## 2020-10-15 NOTE — Progress Notes (Signed)
Date:  10/16/2020   ID:  [name], DOB 06/09/1930, MRN [id number]  Patient Location:  [place] [address]   Provider location:   [facility] [place] office  PCP:  William Haven, MD  Cardiologist:  William Taylor  Chief Complaint  Patient presents with  . Follow-up    1 Month follow up. Medications verbally reviewed with patient, per patient and wife medications list as of today is correct and patient is taking everything on the list.     History of Present Illness:    William Taylor is a 87 y.o. male  past medical history of coronary artery disease, stent in 2005,  atrial fibrillation with RVR in the setting of UTI September 2011,  stent placement May 2012 to the OM 2,  PE following catheterization started on anticoagulation, atrial fibrillation in 2013,  Last atrial fibrillation July 2018  atypical chest pain July 2018 History of anemia Stress test low risk in 06/2019 who presents for follow-up of his coronary disease and atrial fibrillation  LOV 07/31/2020 low BP range at home 120 to 150, sitting at rest Rare orthostasis, "if I jump up"  ER December 2021 for chest pain Records reviewed In the ER, flat troponins.  stress testing which showed no evidence of ischemia and an EF or 77%.   office 1/22 and he reported chest pain and was tachycardic.  Took NTG x 2, no relief, took asa x 4 He was sent to the ER for further work-up.   ED work-up showed troponin negative x 2. No PE on CT. EKG was reassuring.  Echo 09/28/20: concern for septal hypertrophy Cardiac MRI ordered  Periodic back issue  Recently seen in PMD office 155 supine 417 systolic standing  Active, goes shopping,no regular exercise Able to walk around store with cart Sometimes cane for longer distances  Weight in 3/20 was 197 in office Lowest at home was 180 pounds, BP markedly low Today is 213, on lisinopril 10  EKG personally reviewed  by myself on todays visit NSR rate 69 bpm, no st or T changes  Recently seen in the emergency room December 15, 2019 for chest pain Was sitting at home when he felt some sharp prickling pains in the left chest, like he was being poked every 6 couple of seconds Symptoms went away while waiting in the lobby to be seen in the emergency room Blood pressure in the emergency room 164/109 BUN 29 creatinine 1.35 hemoglobin 10.4  He continues on Northera 200 3 times daily Midodrine 2.5 mg 3 times daily Florinef 0.1 milligrams daily  NSR rate 97, no significant ST-T wave changes  Other past medical history reviewed Normal echo 08/2018  Monitor zio for 2 weeks, results reviewed avg HR of 85 bpm. Predominant underlying rhythm was Sinus Rhythm. 6 Supraventricular Tachycardia runs occurred, the run with the fastest interval lasting 7 beats with a max rate of 197 bpm, the longest lasting 18 beats with an avg rate of 126 bpm. Isolated SVEs were occasional (1.2%, 19914), SVE Couplets were rare (<1.0%, 14), and no SVE Triplets were present. Isolated VEs were rare (<1.0%), VE Couplets were rare (<1.0%), and no VE Triplets were present.   Prior CV studies:   The following studies were reviewed today:   Past Medical History:  Diagnosis Date  . Cancer [facility])    Prostate, followed by Dr. Jacqlyn Larsen  . Colon polyp   . Coronary artery  disease    a. 2005 s/p PCI LAD ([facility]); b 2012 s/p PCI OM2; c.10/2013 Cath: LAD 64m ISR, 61m, D1 90, D2 70, OM2 80 w/ patent stent;  d. 06/2019 Low risk MV; e. 07/2020 MV: EF 77%, no ischemia/infarct.  [place] Arnold stones   . Hyperlipidemia   . Hypertension   . MI (myocardial infarction) ([place])    2015  . Orthostatic hypotension    a. prev on midodrine/florinef/northera.  Marland Kitchen PAF (paroxysmal atrial fibrillation) (HCC)    a. on xarelto and amio (CHA2DS2VASc = 4).  . Skin cancer    Past Surgical History:  Procedure Laterality Date  . CARDIAC CATHETERIZATION  10/2013   [facility]  . CATARACT  EXTRACTION  Oct. 3, 2012   right eye  . colonoscopy    . COLONOSCOPY W/ POLYPECTOMY  2015   Dr Rayann Heman  . COLONOSCOPY WITH PROPOFOL N/A 08/15/2016   Procedure: COLONOSCOPY WITH PROPOFOL;  Surgeon: [name], MD;  [facility] ENDOSCOPY;  Service: Endoscopy;  Laterality: N/A;  . CORONARY ANGIOPLASTY  2009   2005; s/p stent  . ENTEROSCOPY N/A 09/22/2018   Procedure: ENTEROSCOPY;  Surgeon: Lin Landsman, MD;  Location: [place];  Service: Gastroenterology;  Laterality: N/A;  . ESOPHAGOGASTRODUODENOSCOPY (EGD) WITH PROPOFOL N/A 08/15/2016   Procedure: ESOPHAGOGASTRODUODENOSCOPY (EGD) WITH PROPOFOL;  Surgeon: [name], MD;  Location: [facility] ENDOSCOPY;  Service: Endoscopy;  Laterality: N/A;  . GIVENS CAPSULE STUDY N/A 09/26/2016   Procedure: GIVENS CAPSULE STUDY;  Surgeon: [name], MD;  Location: [facility] ENDOSCOPY;  Service: Endoscopy;  Laterality: N/A;  . HERNIA REPAIR  2013  . LUMBAR SPINE SURGERY    . UPPER GI ENDOSCOPY  Sept 2015   Dr Rayann Heman     Current Meds  Medication Sig  . amiodarone (PACERONE) 100 MG tablet Take 1 tablet (100 mg total) by mouth daily.  Marland Kitchen azelastine (ASTELIN) 0.1 % nasal spray Place 2 sprays into both nostrils 2 (two) times daily. Use in each nostril as directed  . fenofibrate micronized (ANTARA) 130 MG capsule TAKE 1 CAPSULE BY MOUTH  DAILY BEFORE BREAKFAST  . finasteride (PROSCAR) 5 MG tablet Take 1 tablet (5 mg total) by mouth daily.  Marland Kitchen levobunolol (BETAGAN) 0.5 % ophthalmic solution Place 1 drop into the right eye 2 (two) times daily.  Marland Kitchen loratadine (CLARITIN) 10 MG tablet Take 1 tablet (10 mg total) by mouth daily.  . mirtazapine (REMERON) 15 MG tablet TAKE 1 TABLET BY MOUTH NIGHTLY  . Multiple Vitamin (MULTIVITAMIN PO) Take 1 tablet by mouth daily.  . nitroGLYCERIN (NITROSTAT) 0.4 MG SL tablet DISSOLVE UNDER THE TONGUE 1 TABLET EVERY 5 MINUTES AS NEEDED FOR CHEST PAIN  . pantoprazole (PROTONIX) 40 MG tablet TAKE ONE TABLET BY MOUTH TWICE DAILY  .  pravastatin (PRAVACHOL) 40 MG tablet TAKE 1 TABLET BY MOUTH AT  BEDTIME  . tamsulosin (FLOMAX) 0.4 MG CAPS capsule Take 1 capsule by mouth daily.  Alveda Reasons 15 MG TABS tablet TAKE 1 TABLET BY MOUTH  DAILY  . [DISCONTINUED] midodrine (PROAMATINE) 10 MG tablet Take by mouth.     Allergies:   Bee venom, Sulfasalazine, Sulfa antibiotics, and Warfarin and related   Social History   Tobacco Use  . Smoking status: Never Smoker  . Smokeless tobacco: Never Used  Vaping Use  . Vaping Use: Never used  Substance Use Topics  . Alcohol use: No  . Drug use: No     Family Hx: The patient's family history includes Colon cancer in  his father; Coronary artery disease in his brother; Other (age of onset: 55) in his brother; Stroke in his mother.  ROS:   Please see the history of present illness.    Review of Systems  Constitutional: Negative.   HENT: Negative.   Respiratory: Negative.   Cardiovascular: Negative.   Gastrointestinal: Negative.   Musculoskeletal: Negative.   Neurological: Negative.   Psychiatric/Behavioral: Negative.   All other systems reviewed and are negative.   Labs/Other Tests and Data Reviewed:    Recent Labs: 08/16/2020: B Natriuretic Peptide 41.2 09/08/2020: BUN 29; Creatinine, Ser 1.43; Hemoglobin 11.6; Platelets 275; Potassium 4.5; Sodium 142 09/26/2020: ALT 11; TSH 6.58   Recent Lipid Panel Lab Results  Component Value Date/Time   CHOL 110 06/28/2016 11:06 AM   CHOL 119 10/27/2013 06:09 AM   TRIG 80.0 06/28/2016 11:06 AM   TRIG 61 10/27/2013 06:09 AM   HDL 29.90 (L) 06/28/2016 11:06 AM   HDL 38 (L) 10/27/2013 06:09 AM   CHOLHDL 4 06/28/2016 11:06 AM   LDLCALC 64 06/28/2016 11:06 AM   LDLCALC 69 10/27/2013 06:09 AM    Wt Readings from Last 3 Encounters:  10/16/20 213 lb (96.6 kg)  09/26/20 211 lb (95.7 kg)  09/13/20 212 lb (96.2 kg)     Exam:    BP (!) 150/90 (BP Location: Left Arm, Patient Position: Sitting, Cuff Size: Normal)   Pulse 76   Ht 6'  4" (1.93 m)   Wt 213 lb (96.6 kg)   SpO2 97%   BMI 25.93 kg/m  Constitutional:  oriented to person, place, and time. No distress.  HENT:  Head: Grossly normal Eyes:  no discharge. No scleral icterus.  Neck: No JVD, no carotid bruits  Cardiovascular: Regular rate and rhythm, no murmurs appreciated Pulmonary/Chest: Clear to auscultation bilaterally, no wheezes or rails Abdominal: Soft.  no distension.  no tenderness.  Musculoskeletal: Normal range of motion Neurological:  normal muscle tone. Coordination normal. No atrophy Skin: Skin warm and dry Psychiatric: normal affect, pleasant  ASSESSMENT & PLAN:    Severe orthostasis Associated with dramatic weeight loss in 2020 Previously on Northera ,  Florinef,  midodrine  Weight has stabilized, he is off these medications He is unsure of his medication list, he is concerned about high blood pressure, will add back lisinopril 5 up to 10 mg daily May have been on this on prior office visit but not on his list so hard to tell  Mixed hyperlipidemia Cholesterol is at goal on the current lipid regimen. No changes to the medications were made.  Coronary artery disease involving native coronary artery of native heart without angina pectoris stress test no ischemia, 06/2019 Several episodes of chest pain, with work-up in the emergency room December and January, presenting at rest  unable to exclude gallbladder disease Gallstones seen on CT scan Will order right upper quadrant ultrasound  Atrial fibrillation, unspecified type ([place]) on Xarelto Continue low-dose amiodarone event monitor no significant atrial fibrillation Maintaining normal sinus rhythm  Depression Doing well, recommended regular walking program Gets tired, will need to work on conditioning  Chronic kidney disease (CKD), stage III (moderate) Stable numbers  Iron deficiency anemia, unspecified iron deficiency anemia type Stable numbers hemoglobin 11.6    Total  encounter time more than 25 minutes  Greater than 50% was spent in counseling and coordination of care with the patient  Signed, Ida Rogue, MD  10/16/2020 [place] Office [address]  [address], [place], [place] [address]

## 2020-10-16 ENCOUNTER — Encounter: Payer: Self-pay | Admitting: Cardiovascular Disease

## 2020-10-16 ENCOUNTER — Other Ambulatory Visit: Payer: Self-pay

## 2020-10-16 ENCOUNTER — Ambulatory Visit: Payer: Medicare HMO | Admitting: Cardiovascular Disease

## 2020-10-16 VITALS — BP 150/90 | HR 76 | Ht 76.0 in | Wt 213.0 lb

## 2020-10-16 DIAGNOSIS — I951 Orthostatic hypotension: Secondary | ICD-10-CM | POA: Diagnosis not present

## 2020-10-16 DIAGNOSIS — Z86711 Personal history of pulmonary embolism: Secondary | ICD-10-CM | POA: Diagnosis not present

## 2020-10-16 DIAGNOSIS — I10 Essential (primary) hypertension: Secondary | ICD-10-CM

## 2020-10-16 DIAGNOSIS — R079 Chest pain, unspecified: Secondary | ICD-10-CM

## 2020-10-16 DIAGNOSIS — I25118 Atherosclerotic heart disease of native coronary artery with other forms of angina pectoris: Secondary | ICD-10-CM | POA: Diagnosis not present

## 2020-10-16 DIAGNOSIS — I480 Paroxysmal atrial fibrillation: Secondary | ICD-10-CM | POA: Diagnosis not present

## 2020-10-16 DIAGNOSIS — E785 Hyperlipidemia, unspecified: Secondary | ICD-10-CM

## 2020-10-16 MED ORDER — LISINOPRIL 10 MG PO TABS: 10.0000 mg | ORAL_TABLET | Freq: Every day | ORAL | 3 refills | Status: DC

## 2020-10-16 NOTE — Patient Instructions (Addendum)
Medication Instructions:  Make sure you are not on midodrine, that pushed blood pressure up  start lisinopril 5 mg (1/2 pill)  for 2 weeks, that pushes blood pressure down Monitor blood pressure If still elevated, increase up to 10 mg daily   Lab work: No new labs needed   Testing/Procedures: Someone from Dr. [name] office will call regarding an ultrasound for possible gallstones.   Cardiac MRI 11/10/2020 at 8:00am  You are scheduled for Cardiac MRI on 11/10/2020. Please arrive at the [facility] main entrance of [facility]  [address]  [place], [place] [address]  [phone]  Proceed to the [facility] Radiology Department (First Floor).  ?  Magnetic resonance imaging (MRI) is a painless test that produces images of the inside of the body without using X-rays. During an MRI, strong magnets and radio waves work together in a [employer], political party to form detailed images. MRI images may provide more details about a medical condition than X-rays, CT scans, and ultrasounds can provide.   You may be given earphones to listen for instructions.   You may eat a light breakfast and take medications as ordered   If a contrast material will be used, an IV will be inserted into one of your veins. Contrast material will be injected into your IV.   You will be asked to remove all metal, including: Watch, jewelry, and other metal objects including hearing aids, hair pieces and dentures. (Braces and fillings normally are not a problem.)   If contrast material was used:   It will leave your body through your urine within a day. You may be told to drink plenty of fluids to help flush the contrast material out of your system.   TEST WILL TAKE APPROXIMATELY 1 HOUR   PLEASE NOTIFY SCHEDULING AT LEAST 24 HOURS IN ADVANCE IF YOU ARE UNABLE TO KEEP YOUR APPOINTMENT.       Follow-Up:  . You will need a follow up appointment in 6 months  . Providers on  your designated Care Team:   . Murray Hodgkins, NP . Christell Faith, PA-C . Marrianne Mood, PA-C

## 2020-10-17 ENCOUNTER — Other Ambulatory Visit: Payer: Self-pay

## 2020-10-17 ENCOUNTER — Other Ambulatory Visit (INDEPENDENT_AMBULATORY_CARE_PROVIDER_SITE_OTHER): Payer: Medicare HMO

## 2020-10-17 DIAGNOSIS — R7989 Other specified abnormal findings of blood chemistry: Secondary | ICD-10-CM

## 2020-10-17 DIAGNOSIS — R17 Unspecified jaundice: Secondary | ICD-10-CM | POA: Diagnosis not present

## 2020-10-17 LAB — TSH: TSH: 8.04 u[IU]/mL — ABNORMAL HIGH (ref 0.35–4.50)

## 2020-10-17 LAB — HEPATIC FUNCTION PANEL
ALT: 12 U/L (ref 0–53)
AST: 22 U/L (ref 0–37)
Albumin: 4.2 g/dL (ref 3.5–5.2)
Alkaline Phosphatase: 31 U/L — ABNORMAL LOW (ref 39–117)
Bilirubin, Direct: 0.3 mg/dL (ref 0.0–0.3)
Total Bilirubin: 1.1 mg/dL (ref 0.2–1.2)
Total Protein: 6.6 g/dL (ref 6.0–8.3)

## 2020-10-17 LAB — T4, FREE: Free T4: 0.87 ng/dL (ref 0.60–1.60)

## 2020-11-01 ENCOUNTER — Other Ambulatory Visit: Payer: Self-pay

## 2020-11-01 ENCOUNTER — Ambulatory Visit: Payer: Medicare HMO | Admitting: Gastroenterology

## 2020-11-01 VITALS — BP 134/91 | HR 86 | Ht 76.0 in | Wt 212.0 lb

## 2020-11-01 DIAGNOSIS — R079 Chest pain, unspecified: Secondary | ICD-10-CM | POA: Diagnosis not present

## 2020-11-01 DIAGNOSIS — K8020 Calculus of gallbladder without cholecystitis without obstruction: Secondary | ICD-10-CM

## 2020-11-01 NOTE — Progress Notes (Signed)
William Bellows MD, MRCP(U.K) [address]  [place], [place] [address]  Main: [phone]  Fax: [phone]   Primary Care Physician: [name], MD  Primary Gastroenterologist:  Dr. Jonathon Taylor   Here to see me today to discuss about cholelithiasis  HPI: William Taylor is a 87 y.o. male   Summary of history :  He has a history of CAD,stent placment in 2005 , Atrial fibrillation 2011. Seen previously 2017-2020 for iron deficiency anemia . 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 10/03/16- capsule study showedno bleeding but a few small polyps were seen . 09/2017 : iron studies were normal .11/2017 b12 and folate normal. Seen Dr Janese Banks Hematology - diagnosed with cold agglutinin hemolytic anemia Admitted 09/20/2018 with GI bleed , postural hypotension,melena .  09/22/2018 : Push enteroscopy -1 cm hiatal hernia.     Interval history 02/15/2019 -11/01/2020   09/08/2020: Hb 11.6 grams stable , Cr 1.43  He was seen by Dr Rockey Situ in 10/16/2020 for A fibb  09/08/2020: CT angiogram PE protocol shows chololithiasis  He states that he is here today to see me for the cholelithiasis that was seen incidentally on the imaging.  He says he is being evaluated for chest pain.  He absolutely denies any pain after eating.  Denies any right upper quadrant pain.  Denies any nausea.  He has had chest pain radiating to the left arm.    Current Outpatient Medications  Medication Sig Dispense Refill  . amiodarone (PACERONE) 100 MG tablet Take 1 tablet (100 mg total) by mouth daily. 90 tablet 3  . azelastine (ASTELIN) 0.1 % nasal spray Place 2 sprays into both nostrils 2 (two) times daily. Use in each nostril as directed 30 mL 1  . fenofibrate micronized (ANTARA) 130 MG capsule  TAKE 1 CAPSULE BY MOUTH  DAILY BEFORE BREAKFAST 90 capsule 3  . finasteride (PROSCAR) 5 MG tablet Take 1 tablet (5 mg total) by mouth daily. 90 tablet 3  . levobunolol (BETAGAN) 0.5 % ophthalmic solution Place 1 drop into the right eye 2 (two) times daily.    Marland Kitchen lisinopril (ZESTRIL) 10 MG tablet Take 1 tablet (10 mg total) by mouth daily. 90 tablet 3  . loratadine (CLARITIN) 10 MG tablet Take 1 tablet (10 mg total) by mouth daily. 30 tablet 1  . mirtazapine (REMERON) 15 MG tablet TAKE 1 TABLET BY MOUTH NIGHTLY 30 tablet 3  . Multiple Vitamin (MULTIVITAMIN PO) Take 1 tablet by mouth daily.    . nitroGLYCERIN (NITROSTAT) 0.4 MG SL tablet DISSOLVE UNDER THE TONGUE 1 TABLET EVERY 5 MINUTES AS NEEDED FOR CHEST PAIN 25 tablet 0  . pantoprazole (PROTONIX) 40 MG tablet TAKE ONE TABLET BY MOUTH TWICE DAILY 60 tablet 5  . pravastatin (PRAVACHOL) 40 MG tablet TAKE 1 TABLET BY MOUTH AT  BEDTIME 90 tablet 3  . tamsulosin (FLOMAX) 0.4 MG CAPS capsule Take 1 capsule by mouth daily.    Alveda Reasons 15 MG TABS tablet TAKE 1 TABLET BY MOUTH  DAILY 90 tablet 3   No current facility-administered medications for this visit.    Allergies as of 11/01/2020 - Review Complete 11/01/2020  Allergen Reaction Noted  . Bee venom Swelling 07/17/2015  . Sulfasalazine Hives and Swelling 01/22/2016  . Sulfa antibiotics Hives and Swelling 01/22/2016  . Warfarin  and related Other (See Comments) 05/18/2012    ROS:  General: Negative for anorexia, weight loss, fever, chills, fatigue, weakness. ENT: Negative for hoarseness, difficulty swallowing , nasal congestion. CV: Negative for chest pain, angina, palpitations, dyspnea on exertion, peripheral edema.  Respiratory: Negative for dyspnea at rest, dyspnea on exertion, cough, sputum, wheezing.  GI: See history of present illness. GU:  Negative for dysuria, hematuria, urinary incontinence, urinary frequency, nocturnal urination.  Endo: Negative for unusual weight change.     Physical Examination:   BP (!) 134/91   Pulse 86   Ht 6\' 4"  (1.93 m)   Wt 212 lb (96.2 kg)   BMI 25.81 kg/m   General: Well-nourished, well-developed in no acute distress.  Eyes: No icterus. Conjunctivae pink. Abdomen: Bowel sounds are normal, nontender, nondistended, no hepatosplenomegaly or masses, no abdominal bruits or hernia , no rebound or guarding.   Neuro: Alert and oriented x 3.  Grossly intact. Skin: Warm and dry, no jaundice.   Psych: Alert and cooperative, normal mood and affect.   Imaging Studies: No results found.  Assessment and Plan:   William Taylor is a 87 y.o. y/o male with a prior history of iron deficiency anemia , AVM's of the colon.  He is here today to see me for cholelithiasis incidentally found on a CT scan PE protocol.  He is being evaluated by Dr. Rockey Situ for chest pain.  He does not have a history suggestive of biliary colic which would be postprandial pain.  He has had chest pains not triggered by food intake which is being evaluated.  I explained to him that 30% of the population has incidental finding of gallstones on imaging.  There would be no indication for further evaluation unless patient had symptoms suggesting the same.  I have discussed with Dr. Rockey Situ and his main concern was multiple visits to the emergency room for chest pain and he wanted to make sure that it was not biliary colic.  I discussed that at this point of time he has had absolutely no postprandial pain and hence would not warrant referral for cholecystectomy, I will give the patient a call in 3 to 4 weeks to ensure that he has had no symptoms after eating whatsoever.    Dr William Bellows  MD,MRCP [facility]) Follow up in 4 weeks telephone visit

## 2020-11-09 ENCOUNTER — Telehealth (HOSPITAL_COMMUNITY): Payer: Self-pay | Admitting: Emergency Medicine

## 2020-11-09 NOTE — Telephone Encounter (Signed)
Left detailed message informing patient that the MR machine is down and requires maintenance, therefore his appt for tomorrow 3/25 has been cancelled and someone will be in touch with in the next few days to r/s  [place] [facility] [phone] Office  [phone] Cell

## 2020-11-09 NOTE — Telephone Encounter (Signed)
Reaching out to patient to offer assistance regarding upcoming cardiac imaging study; pt verbalizes understanding of appt date/time, parking situation and where to check in, and verified current allergies; name and call back number provided for further questions should they arise Marchia Bond RN [place] [facility] [phone] office [phone] cell  Denies implants denies claustro

## 2020-11-10 ENCOUNTER — Encounter (HOSPITAL_COMMUNITY): Payer: Self-pay

## 2020-11-10 ENCOUNTER — Ambulatory Visit (HOSPITAL_COMMUNITY): Payer: Medicare HMO

## 2020-11-13 ENCOUNTER — Other Ambulatory Visit: Payer: Self-pay

## 2020-11-13 ENCOUNTER — Ambulatory Visit (HOSPITAL_COMMUNITY)
Admission: RE | Admit: 2020-11-13 | Discharge: 2020-11-13 | Disposition: A | Discharge status: Home / Self Care | Payer: Medicare HMO | Source: Ambulatory Visit | Attending: Medical | Admitting: Medical

## 2020-11-13 DIAGNOSIS — R000 Tachycardia, unspecified: Secondary | ICD-10-CM | POA: Diagnosis not present

## 2020-11-13 DIAGNOSIS — I517 Cardiomegaly: Secondary | ICD-10-CM

## 2020-11-13 MED ORDER — GADOBUTROL 1 MMOL/ML IV SOLN: 10.0000 mL | Freq: Once | INTRAVENOUS | Status: DC | PRN

## 2020-11-13 MED ORDER — GADOBUTROL 1 MMOL/ML IV SOLN
10.0000 mL | Freq: Once | INTRAVENOUS | Status: AC | PRN
  Administered 2020-11-13: 10 mL via INTRAVENOUS

## 2020-12-11 ENCOUNTER — Other Ambulatory Visit: Payer: Self-pay | Admitting: Family Medicine

## 2020-12-21 ENCOUNTER — Other Ambulatory Visit: Payer: Self-pay

## 2020-12-22 ENCOUNTER — Other Ambulatory Visit: Payer: Self-pay | Admitting: Family

## 2020-12-25 NOTE — Telephone Encounter (Signed)
Rx request sent to pharmacy.  

## 2020-12-27 ENCOUNTER — Telehealth: Payer: Self-pay

## 2020-12-27 ENCOUNTER — Other Ambulatory Visit: Payer: Self-pay

## 2020-12-27 ENCOUNTER — Ambulatory Visit: Payer: Medicare HMO | Admitting: Family Medicine

## 2020-12-27 ENCOUNTER — Encounter: Payer: Self-pay | Admitting: Family Medicine

## 2020-12-27 DIAGNOSIS — I10 Essential (primary) hypertension: Secondary | ICD-10-CM

## 2020-12-27 DIAGNOSIS — D5912 Cold autoimmune hemolytic anemia: Secondary | ICD-10-CM

## 2020-12-27 DIAGNOSIS — C61 Malignant neoplasm of prostate: Secondary | ICD-10-CM

## 2020-12-27 DIAGNOSIS — N401 Enlarged prostate with lower urinary tract symptoms: Secondary | ICD-10-CM

## 2020-12-27 DIAGNOSIS — I422 Other hypertrophic cardiomyopathy: Secondary | ICD-10-CM

## 2020-12-27 LAB — BASIC METABOLIC PANEL
BUN: 27 mg/dL — ABNORMAL HIGH (ref 6–23)
CO2: 28 mEq/L (ref 19–32)
Calcium: 9.5 mg/dL (ref 8.4–10.5)
Chloride: 107 mEq/L (ref 96–112)
Creatinine, Ser: 1.45 mg/dL (ref 0.40–1.50)
GFR: 42.86 mL/min — ABNORMAL LOW (ref >60.00)
Glucose, Bld: 96 mg/dL (ref 70–99)
Potassium: 4.4 mEq/L (ref 3.5–5.1)
Sodium: 143 mEq/L (ref 135–145)

## 2020-12-27 NOTE — Assessment & Plan Note (Signed)
Generally stable.  He will continue finasteride.  He will continue to see urology.

## 2020-12-27 NOTE — Addendum Note (Signed)
Addended by: Caryl Bis, Kaien Pezzullo G on: 12/27/2020 11:19 AM   Modules accepted: Orders

## 2020-12-27 NOTE — Patient Instructions (Signed)
Nice to see you. We will get labs today. 

## 2020-12-27 NOTE — Assessment & Plan Note (Addendum)
Nonobstructive.  Patient has been evaluated by cardiology with MRI for this.  They recommended no change to management.  There was mention of genetic testing.  He will discuss this with his cardiologist at follow-up.

## 2020-12-27 NOTE — Assessment & Plan Note (Signed)
Most recent PSA was acceptable.  He will continue to see urology.

## 2020-12-27 NOTE — Assessment & Plan Note (Addendum)
Slightly elevated though he does have significant drops in his blood pressure after standing.  I do not feel like we need to increase his medication at this time given his orthostasis.  He is currently asymptomatic from that.  He will continue lisinopril 10 mg once daily and continue to monitor his blood pressure. Check BMET.

## 2020-12-27 NOTE — Assessment & Plan Note (Signed)
Due for lab work.  Orders placed.

## 2020-12-27 NOTE — Telephone Encounter (Signed)
Please let the patient know that he will have to have his CBC redrawn given an issue with the blood once it arrived at our lab.  Order placed.

## 2020-12-27 NOTE — Progress Notes (Addendum)
Tommi Rumps, MD Phone: [phone]  William Taylor is a 87 y.o. male who presents today for f/u.  HYPERTENSION  Disease Monitoring  Home BP Monitoring patient notes his blood pressure runs in the 150s over 80s when he is sitting down though soon as he stands up and waits 2 to 3 minutes it drops 20-30 points.  He does not have any lightheadedness.  No chest pain.  Has chronic stable dyspnea.  No edema.  He is on lisinopril 10 mg once daily.  Cardiac MRI did reveal nonobstructive HCM though given patient's age and lack of high risk factors they did not recommend any additional therapy or change in management.  History of prostate cancer: He continues to follow with urology.  He does note some urinary frequency though notes good flow and no straining.  He feels as though he does not empty his bladder fully at times.  No longer on Flomax.  He is on finasteride.  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  Social History   Tobacco Use  Smoking Status Never Smoker  Smokeless Tobacco Never Used    Current Outpatient Medications on File Prior to Visit  Medication Sig Dispense Refill  . amiodarone (PACERONE) 100 MG tablet TAKE 1 TABLET BY MOUTH  DAILY 90 tablet 3  . azelastine (ASTELIN) 0.1 % nasal spray Place 2 sprays into both nostrils 2 (two) times daily. Use in each nostril as directed 30 mL 1  . fenofibrate micronized (ANTARA) 130 MG capsule TAKE 1 CAPSULE BY MOUTH  DAILY BEFORE BREAKFAST 90 capsule 3  . finasteride (PROSCAR) 5 MG tablet Take 1 tablet (5 mg total) by mouth daily. 90 tablet 3  . levobunolol (BETAGAN) 0.5 % ophthalmic solution Place 1 drop into the right eye 2 (two) times daily.    Marland Kitchen lisinopril (ZESTRIL) 10 MG tablet Take 1 tablet (10 mg total) by mouth daily. 90 tablet 3  . loratadine (CLARITIN) 10 MG tablet Take 1 tablet (10 mg total) by mouth daily. 30 tablet 1  . mirtazapine (REMERON) 15 MG tablet TAKE 1 TABLET BY MOUTH  NIGHTLY 30 tablet 3  . Multiple Vitamin (MULTIVITAMIN PO) Take 1 tablet by mouth daily.    . nitroGLYCERIN (NITROSTAT) 0.4 MG SL tablet DISSOLVE UNDER THE TONGUE 1 TABLET EVERY 5 MINUTES AS NEEDED FOR CHEST PAIN 25 tablet 0  . pantoprazole (PROTONIX) 40 MG tablet TAKE ONE TABLET BY MOUTH TWICE DAILY 60 tablet 5  . pravastatin (PRAVACHOL) 40 MG tablet TAKE 1 TABLET BY MOUTH AT  BEDTIME 90 tablet 3  . XARELTO 15 MG TABS tablet TAKE 1 TABLET BY MOUTH  DAILY 90 tablet 3   No current facility-administered medications on file prior to visit.     ROS see history of present illness  Objective  Physical Exam Vitals:   12/27/20 1054  BP: (!) 160/80  Pulse: 92  Temp: (!) 97.4 F (36.3 C)  SpO2: 98%    BP Readings from Last 3 Encounters:  12/27/20 (!) 160/80  11/01/20 (!) 134/91  10/16/20 (!) 150/90   Wt Readings from Last 3 Encounters:  12/27/20 213 lb (96.6 kg)  11/01/20 212 lb (96.2 kg)  10/16/20 213 lb (96.6 kg)    Physical Exam Constitutional:      General: He is not in acute distress.    Appearance: He is not diaphoretic.  Cardiovascular:     Rate and Rhythm: Normal rate and regular rhythm.  Heart sounds: Normal heart sounds.  Pulmonary:     Effort: Pulmonary effort is normal.     Breath sounds: Normal breath sounds.  Musculoskeletal:     Right lower leg: No edema.     Left lower leg: No edema.  Skin:    General: Skin is warm and dry.  Neurological:     Mental Status: He is alert.      Assessment/Plan: Please see individual problem list.  Problem List Items Addressed This Visit    Hypertension    Slightly elevated though he does have significant drops in his blood pressure after standing.  I do not feel like we need to increase his medication at this time given his orthostasis.  He is currently asymptomatic from that.  He will continue lisinopril 10 mg once daily and continue to monitor his blood pressure. Check BMET.      Relevant Orders   Basic  Metabolic Panel (BMET)   Prostate cancer ([place])    Most recent PSA was acceptable.  He will continue to see urology.      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  Orders placed.      Relevant Orders   Haptoglobin   CBC w/Diff   Reticulocytes   Benign prostatic hyperplasia with lower urinary tract symptoms    Generally stable.  He will continue finasteride.  He will continue to see urology.      Hypertrophic cardiomyopathy (HCC)    Nonobstructive.  Patient has been evaluated by cardiology with MRI for this.  They recommended no change to management.  There was mention of genetic testing.  He will discuss this with his cardiologist at follow-up.        Return in about 6 months (around 06/29/2021) for Hypertension.  This visit occurred during the SARS-CoV-2 public health emergency.  Safety protocols were in place, including screening questions prior to the visit, additional usage of staff PPE, and extensive cleaning of exam room while observing appropriate contact time as indicated for disinfecting solutions.    Tommi Rumps, MD [place]

## 2020-12-28 LAB — RETICULOCYTES: Retic Ct Pct: 2.1 % (ref 0.6–2.6)

## 2020-12-28 LAB — HAPTOGLOBIN: Haptoglobin: <8 mg/dL — ABNORMAL LOW (ref 43–212)

## 2020-12-28 NOTE — Telephone Encounter (Signed)
I called and informed the patient of the recollect of the cbc and he understood and is scheduled for tomorrow.  William Taylor,cma

## 2020-12-29 ENCOUNTER — Other Ambulatory Visit (INDEPENDENT_AMBULATORY_CARE_PROVIDER_SITE_OTHER): Payer: Medicare HMO

## 2020-12-29 ENCOUNTER — Other Ambulatory Visit: Payer: Self-pay

## 2020-12-29 DIAGNOSIS — D5912 Cold autoimmune hemolytic anemia: Secondary | ICD-10-CM

## 2020-12-29 LAB — CBC
HCT: 32.7 % — ABNORMAL LOW (ref 38.5–50.0)
Hemoglobin: 10.9 g/dL — ABNORMAL LOW (ref 13.2–17.1)
MCH: 33.1 pg — ABNORMAL HIGH (ref 27.0–33.0)
MCHC: 33.3 g/dL (ref 32.0–36.0)
MCV: 99.4 fL (ref 80.0–100.0)
MPV: 11.6 fL (ref 7.5–12.5)
Platelets: 282 10*3/uL (ref 140–400)
RBC: 3.29 10*6/uL — ABNORMAL LOW (ref 4.20–5.80)
RDW: 12.9 % (ref 11.0–15.0)
WBC: 7.7 10*3/uL (ref 3.8–10.8)

## 2021-03-12 ENCOUNTER — Other Ambulatory Visit: Payer: Self-pay | Admitting: Cardiovascular Disease

## 2021-04-10 NOTE — Progress Notes (Signed)
Date:  04/11/2021   ID:  William Taylor, DOB 1930-05-11, MRN [id number]  Patient Location:  [place] [address]   Provider location:   [facility], [place] office  PCP:  [name], MD  Cardiologist:  [facility]  Chief Complaint  Patient presents with   6 month follow up     Patient c/o chest discomfort at times and shortness of breath with any amount of exertion. Medications reviewed by the patient verbally.     History of Present Illness:    William Taylor is a 87 y.o. male  past medical history of coronary artery disease, stent in 2005,  atrial fibrillation with RVR in the setting of UTI September 2011,  stent placement May 2012 to the OM 2,  PE following catheterization started on anticoagulation, atrial fibrillation in 2013,  Last atrial fibrillation July 2018  atypical chest pain July 2018 History of anemia Stress test low risk in 06/2019 Prior rapid weight loss 50 pounds with associated hypotension requiring Northera , Midodrine Florinef , these have been weaned off with weight gain who presents for follow-up of his coronary disease and atrial fibrillation  In general doing well, no regular exercise program Rare episodes of chest pain "anytime" , 1 every few weeks Not with regular walking Several trips to the emergency room for chest pain over the past year, work-up unrevealing  SOB bending to tie shoes Some wheezing Can walk on the flat  Minimal orthostasis Sitting 147/89 Standing: 137/87 At worst might drop to [id number] systolic  MRI results discussed in detail Normal ejection fraction severe asymmetric septal hypertrophy of the basal septum measuring 1.6 cm  EKG personally reviewed by myself on todays visit Normal sinus rhythm rate 89 bpm no significant ST-T wave changes  Other past medical history reviewed  ER December 2021 for chest pain Records reviewed In the ER, flat troponins.  stress  testing which showed no evidence of ischemia and an EF or 77%.   office 1/22 and he reported chest pain and was tachycardic.  Took NTG x 2, no relief, took asa x 4 He was sent to the ER for further work-up.    ED work-up showed troponin negative x 2. No PE on CT. EKG was reassuring.  Echo 09/28/20: concern for septal hypertrophy Cardiac MRI ordered  Periodic back issue  Recently seen in PMD office 155 supine [id number] systolic standing  Recently seen in the emergency room December 15, 2019 for chest pain Was sitting at home when he felt some sharp prickling pains in the left chest, like he was being poked every 6 couple of seconds Symptoms went away while waiting in the lobby to be seen in the emergency room Blood pressure in the emergency room 164/109 BUN 29 creatinine 1.35 hemoglobin 10.4  Normal echo 08/2018  Monitor zio for 2 weeks, results reviewed avg HR of 85 bpm. Predominant underlying rhythm was Sinus Rhythm. 6 Supraventricular Tachycardia runs occurred, the run with the fastest interval lasting 7 beats with a max rate of 197 bpm, the longest lasting 18 beats with an avg rate of 126 bpm. Isolated SVEs were occasional (1.2%, 19914), SVE Couplets were rare (<1.0%, 14), and no SVE Triplets were present. Isolated VEs were rare (<1.0%), VE Couplets were rare (<1.0%), and no VE Triplets were present.    Past Medical History:  Diagnosis Date   Cancer [facility])    Prostate, followed by Dr. Jacqlyn Larsen  Colon polyp    Coronary artery disease    a. 2005 s/p PCI LAD ([facility]); b 2012 s/p PCI OM2; c.10/2013 Cath: LAD 61mISR, 454mD1 90, D2 70, OM2 80 w/ patent stent;  d. 06/2019 Low risk MV; e. 07/2020 MV: EF 77%, no ischemia/infarct.   Gall stones    Hyperlipidemia    Hypertension    MI ([facility] infarction) ([facility]   2015   Orthostatic hypotension    a. prev on midodrine/florinef/northera.   PAF (paroxysmal atrial fibrillation) (HCC)    a. on xarelto and amio (CHA2DS2VASc = 4).   Skin cancer     Past Surgical History:  Procedure Laterality Date   CARDIAC CATHETERIZATION  10/2013   [facility]   CATARACT EXTRACTION  Oct. 3, 2012   right eye   colonoscopy     COLONOSCOPY W/ POLYPECTOMY  2015   Dr ReRayann Heman COLONOSCOPY WITH PROPOFOL N/A 08/15/2016   Procedure: COLONOSCOPY WITH PROPOFOL;  Surgeon: KiJonathon BellowsMD;  [facility];  Service: Endoscopy;  Laterality: N/A;   CORONARY ANGIOPLASTY  2009   2005; s/p stent   ENTEROSCOPY N/A 09/22/2018   Procedure: ENTEROSCOPY;  Surgeon: VaLin LandsmanMD;  [facility];  Service: Gastroenterology;  Laterality: N/A;   ESOPHAGOGASTRODUODENOSCOPY (EGD) WITH PROPOFOL N/A 08/15/2016   Procedure: ESOPHAGOGASTRODUODENOSCOPY (EGD) WITH PROPOFOL;  Surgeon: KiJonathon BellowsMD;  Location: [facility] ENDOSCOPY;  Service: Endoscopy;  Laterality: N/A;   GIVENS CAPSULE STUDY N/A 09/26/2016   Procedure: GIVENS CAPSULE STUDY;  Surgeon: KiJonathon BellowsMD;  Location: [facility] ENDOSCOPY;  Service: Endoscopy;  Laterality: N/A;   HERNIA REPAIR  2013   LUMBAR SPINE SURGERY     UPPER GI ENDOSCOPY  Sept 2015   Dr ReRayann Heman   Current Meds  Medication Sig   azelastine (ASTELIN) 0.1 % nasal spray Place 2 sprays into both nostrils 2 (two) times daily. Use in each nostril as directed   fenofibrate micronized (ANTARA) 130 MG capsule TAKE 1 CAPSULE BY MOUTH  DAILY BEFORE BREAKFAST   finasteride (PROSCAR) 5 MG tablet Take 1 tablet (5 mg total) by mouth daily.   levobunolol (BETAGAN) 0.5 % ophthalmic solution Place 1 drop into the right eye 2 (two) times daily.   loratadine (CLARITIN) 10 MG tablet Take 1 tablet (10 mg total) by mouth daily.   mirtazapine (REMERON) 15 MG tablet TAKE 1 TABLET BY MOUTH NIGHTLY   Multiple Vitamin (MULTIVITAMIN PO) Take 1 tablet by mouth daily.   pantoprazole (PROTONIX) 40 MG tablet TAKE ONE TABLET BY MOUTH TWICE DAILY   [DISCONTINUED] amiodarone (PACERONE) 100 MG tablet TAKE 1 TABLET BY MOUTH  DAILY   [DISCONTINUED] lisinopril (ZESTRIL) 10  MG tablet Take 1 tablet (10 mg total) by mouth daily.   [DISCONTINUED] nitroGLYCERIN (NITROSTAT) 0.4 MG SL tablet DISSOLVE UNDER THE TONGUE 1 TABLET EVERY 5 MINUTES AS NEEDED FOR CHEST PAIN   [DISCONTINUED] pravastatin (PRAVACHOL) 40 MG tablet TAKE 1 TABLET BY MOUTH AT  BEDTIME   [DISCONTINUED] XARELTO 15 MG TABS tablet TAKE 1 TABLET BY MOUTH  DAILY     Allergies:   Bee venom, Sulfasalazine, Sulfa antibiotics, and Warfarin and related   Social History   Tobacco Use   Smoking status: Never   Smokeless tobacco: Never  Vaping Use   Vaping Use: Never used  Substance Use Topics   Alcohol use: No   Drug use: No     Family Hx: The patient's family history includes Colon cancer in his  father; Coronary artery disease in his brother; Other (age of onset: 47) in his brother; Stroke in his mother.  ROS:   Please see the history of present illness.    Review of Systems  Constitutional: Negative.   HENT: Negative.    Respiratory: Negative.    Cardiovascular: Negative.   Gastrointestinal: Negative.   Musculoskeletal: Negative.   Neurological: Negative.   Psychiatric/Behavioral: Negative.    All other systems reviewed and are negative.  Labs/Other Tests and Data Reviewed:    Recent Labs: 08/16/2020: B Natriuretic Peptide 41.2 10/17/2020: ALT 12; TSH 8.04 12/27/2020: BUN 27; Creatinine, Ser 1.45; Potassium 4.4; Sodium 143 12/29/2020: Hemoglobin 10.9; Platelets 282   Recent Lipid Panel Lab Results  Component Value Date/Time   CHOL 110 06/28/2016 11:06 AM   CHOL 119 10/27/2013 06:09 AM   TRIG 80.0 06/28/2016 11:06 AM   TRIG 61 10/27/2013 06:09 AM   HDL 29.90 (L) 06/28/2016 11:06 AM   HDL 38 (L) 10/27/2013 06:09 AM   CHOLHDL 4 06/28/2016 11:06 AM   LDLCALC 64 06/28/2016 11:06 AM   LDLCALC 69 10/27/2013 06:09 AM    Wt Readings from Last 3 Encounters:  04/11/21 213 lb 4 oz (96.7 kg)  12/27/20 213 lb (96.6 kg)  11/01/20 212 lb (96.2 kg)     Exam:    BP 110/70 (BP Location: Left  Arm, Patient Position: Sitting, Cuff Size: Normal)   Pulse 89   Ht '6\' 4"'$  (1.93 m)   Wt 213 lb 4 oz (96.7 kg)   SpO2 97%   BMI 25.96 kg/m  Constitutional:  oriented to person, place, and time. No distress.  HENT:  Head: Grossly normal Eyes:  no discharge. No scleral icterus.  Neck: No JVD, no carotid bruits  Cardiovascular: Regular rate and rhythm, no murmurs appreciated Pulmonary/Chest: Clear to auscultation bilaterally, no wheezes or rails Abdominal: Soft.  no distension.  no tenderness.  Musculoskeletal: Normal range of motion Neurological:  normal muscle tone. Coordination normal. No atrophy Skin: Skin warm and dry Psychiatric: normal affect, pleasant   ASSESSMENT & PLAN:    Severe orthostasis Associated with dramatic weeight loss in 2020 Previously on Northera ,  Florinef,  midodrine  Weight has stabilized, he is off these medications Mild orthostasis, lowest blood pressure he reports 120 Not having significant orthostasis symptoms at home  Mixed hyperlipidemia  Cholesterol is at goal on the current lipid regimen. No changes to the medications were made.  Coronary artery disease involving native coronary artery of native heart without angina pectoris stress test no ischemia, 06/2019 Several episodes of chest pain, with work-up in the emergency room December and January, presenting at rest  Somewhat atypical in nature, not presenting with exertion, no escalation in symptoms on further discussion today -Recommended that if he has clear escalation of symptoms concerning for unstable angina, we would proceed with cardiac catheterization He will let us know if symptoms get worse  Atrial fibrillation, unspecified type ([place]) on Xarelto event monitor no significant atrial fibrillation Maintaining normal sinus rhythm  Depression Weight stabilized  Chronic kidney disease (CKD), stage III (moderate) Stable numbers   Iron deficiency anemia, unspecified iron deficiency  anemia type Baseline anemia Hemoglobin 10.9 This may contribute to shortness of breath on exertion    Total encounter time more than 25 minutes  Greater than 50% was spent in counseling and coordination of care with the patient  Signed, Ida Rogue, MD  04/11/2021 12:46 PM    [place]  Office [address], [place], [place] [address]

## 2021-04-11 ENCOUNTER — Ambulatory Visit: Payer: Medicare HMO | Admitting: Cardiovascular Disease

## 2021-04-11 ENCOUNTER — Other Ambulatory Visit: Payer: Self-pay

## 2021-04-11 ENCOUNTER — Encounter: Payer: Self-pay | Admitting: Cardiovascular Disease

## 2021-04-11 VITALS — BP 110/70 | HR 89 | Ht 76.0 in | Wt 213.2 lb

## 2021-04-11 DIAGNOSIS — I480 Paroxysmal atrial fibrillation: Secondary | ICD-10-CM

## 2021-04-11 DIAGNOSIS — I951 Orthostatic hypotension: Secondary | ICD-10-CM

## 2021-04-11 DIAGNOSIS — Z86711 Personal history of pulmonary embolism: Secondary | ICD-10-CM | POA: Diagnosis not present

## 2021-04-11 DIAGNOSIS — I25118 Atherosclerotic heart disease of native coronary artery with other forms of angina pectoris: Secondary | ICD-10-CM | POA: Diagnosis not present

## 2021-04-11 DIAGNOSIS — I10 Essential (primary) hypertension: Secondary | ICD-10-CM

## 2021-04-11 DIAGNOSIS — Z760 Encounter for issue of repeat prescription: Secondary | ICD-10-CM

## 2021-04-11 DIAGNOSIS — E785 Hyperlipidemia, unspecified: Secondary | ICD-10-CM

## 2021-04-11 MED ORDER — LISINOPRIL 10 MG PO TABS: 10.0000 mg | ORAL_TABLET | Freq: Every day | ORAL | 3 refills | Status: DC

## 2021-04-11 MED ORDER — NITROGLYCERIN 0.4 MG SL SUBL: SUBLINGUAL_TABLET | SUBLINGUAL | 3 refills | Status: DC

## 2021-04-11 MED ORDER — NITROGLYCERIN 0.4 MG SL SUBL: SUBLINGUAL_TABLET | SUBLINGUAL | 1 refills | Status: DC

## 2021-04-11 MED ORDER — PRAVASTATIN SODIUM 40 MG PO TABS: 40.0000 mg | ORAL_TABLET | Freq: Every day | ORAL | 3 refills | Status: DC

## 2021-04-11 MED ORDER — RIVAROXABAN 15 MG PO TABS
15.0000 mg | ORAL_TABLET | Freq: Every day | ORAL | 3 refills | Status: DC
Start: 2021-04-11 — End: 2022-02-14

## 2021-04-11 MED ORDER — AMIODARONE HCL 100 MG PO TABS: 100.0000 mg | ORAL_TABLET | Freq: Every day | ORAL | 3 refills | Status: DC

## 2021-04-11 NOTE — Patient Instructions (Addendum)
Medication Instructions:  No changes  If you need a refill on your cardiac medications before your next appointment, please call your pharmacy.   Lab work: No new labs needed  Testing/Procedures: No new testing needed  Follow-Up: At [facility], you and your health needs are our priority.  As part of our continuing mission to provide you with exceptional heart care, we have created designated Provider Care Teams.  These Care Teams include your primary Cardiologist (physician) and Advanced Practice Providers (APPs -  Physician Assistants and Nurse Practitioners) who all work together to provide you with the care you need, when you need it.  You will need a follow up appointment in 12 months  Providers on your designated Care Team:   Christopher Berge, NP Ryan Dunn, PA-C Jacquelyn Visser, PA-C Cadence Furth, PA-C  COVID-19 Vaccine Information can be found at: [email or web address] For questions related to vaccine distribution or appointments, please email [email or web address] or call [phone].    

## 2021-04-28 ENCOUNTER — Other Ambulatory Visit: Payer: Self-pay | Admitting: Family Medicine

## 2021-05-04 ENCOUNTER — Encounter: Payer: Self-pay | Admitting: Family Medicine

## 2021-05-04 DIAGNOSIS — E782 Mixed hyperlipidemia: Secondary | ICD-10-CM

## 2021-05-04 MED ORDER — FENOFIBRATE MICRONIZED 130 MG PO CAPS: ORAL_CAPSULE | ORAL | 3 refills | Status: DC

## 2021-06-12 ENCOUNTER — Other Ambulatory Visit: Payer: Self-pay

## 2021-06-12 ENCOUNTER — Ambulatory Visit: Payer: Medicare HMO | Admitting: Cardiovascular Disease

## 2021-06-12 ENCOUNTER — Encounter: Payer: Self-pay | Admitting: Cardiovascular Disease

## 2021-06-12 VITALS — BP 130/80 | HR 83 | Ht 76.0 in | Wt 213.1 lb

## 2021-06-12 DIAGNOSIS — I10 Essential (primary) hypertension: Secondary | ICD-10-CM | POA: Diagnosis not present

## 2021-06-12 DIAGNOSIS — R079 Chest pain, unspecified: Secondary | ICD-10-CM

## 2021-06-12 DIAGNOSIS — I480 Paroxysmal atrial fibrillation: Secondary | ICD-10-CM

## 2021-06-12 DIAGNOSIS — I25118 Atherosclerotic heart disease of native coronary artery with other forms of angina pectoris: Secondary | ICD-10-CM

## 2021-06-12 DIAGNOSIS — I951 Orthostatic hypotension: Secondary | ICD-10-CM | POA: Diagnosis not present

## 2021-06-12 DIAGNOSIS — E785 Hyperlipidemia, unspecified: Secondary | ICD-10-CM

## 2021-06-12 DIAGNOSIS — Z86711 Personal history of pulmonary embolism: Secondary | ICD-10-CM

## 2021-06-12 NOTE — Patient Instructions (Addendum)
Medication Instructions:  No changes  If you need a refill on your cardiac medications before your next appointment, please call your pharmacy.    Lab work: No new labs needed   Testing/Procedures: No new testing needed   Follow-Up: At [facility], you and your health needs are our priority.  As part of our continuing mission to provide you with exceptional heart care, we have created designated Provider Care Teams.  These Care Teams include your primary Cardiologist (physician) and Advanced Practice Providers (APPs -  Physician Assistants and Nurse Practitioners) who all work together to provide you with the care you need, when you need it.  You will need a follow up appointment in 6 months  Providers on your designated Care Team:   Christopher Berge, NP Ryan Dunn, PA-C Cadence Furth, PA-C  COVID-19 Vaccine Information can be found at: [email or web address] For questions related to vaccine distribution or appointments, please email [email or web address] or call [phone].   

## 2021-06-12 NOTE — Progress Notes (Signed)
Date:  06/12/2021   ID:  William Taylor, DOB [birth year], MRN [id number]  Patient Location:  [place] [address]   Provider location:   [facility], [place] office  PCP:  [name], MD  Cardiologist:  Arvid Right [facility]  Chief Complaint  Patient presents with   Follow up Cardiac Cath     Patient c/o chest pain & shortness of breath off & on since his cardiac cath. Medications reviewed by the patient verbally.     History of Present Illness:    William Taylor is a 87 y.o. male  past medical history of coronary artery disease, stent in 2005, cypher LAD atrial fibrillation with RVR in the setting of UTI September 2011,   cardiac catheterization Dec 25 2010, showing 90% OM 2 disease, transferred to [facility]  with DES stent placed, 2.25 x 16 mm POMUS stent 10/2013: Cardiac catheterization performed given his severe chest pain and no disease showing severe small vessel disease, patent stents PE following catheterization started on anticoagulation, atrial fibrillation in 2013,  Last atrial fibrillation July 2018  atypical chest pain July 2018 History of anemia Stress test low risk in 06/2019 Prior rapid weight loss 50 pounds with associated hypotension requiring Northera , Midodrine Florinef , these have been weaned off with weight gain Chronic chest pain Severe asymmetric septal hypertrophy on MRI 1.6 cm septum March 2022 who presents for follow-up of his coronary disease and atrial fibrillation  LOV 03/2021 On visit today reports having recent episode of chest pain this past Sunday Thought he might have to go to the emergency room, was concerned about blockages Discussed prior cardiac catheterization history, stent in 2005, stent 2012, repeat catheterization 2015, medical management recommended in 2015 Several visits to the emergency room for atypical chest pain,  stress testing December 2021, November 2020 low risk studies This  past Sunday did not take nitro  Denies getting routine chest pain on exertion such as walking, grocery shopping No regular exercise program Denies having orthostasis symptoms Weight stable  MRI results discussed in detail Normal ejection fraction severe asymmetric septal hypertrophy of the basal septum measuring 1.6 cm  EKG personally reviewed by myself on todays visit Normal sinus rhythm rate 83 bpm no significant ST-T wave changes, consider old inferior MI  Other past medical history reviewed ER December 2021 for chest pain In the ER, flat troponins.  stress testing which showed no evidence of ischemia and an EF or 77%.   office 1/22 and he reported chest pain and was tachycardic.  Took NTG x 2, no relief, took asa x 4 He was sent to the ER for further work-up.  ED work-up showed troponin negative x 2. No PE on CT. EKG was reassuring.  Echo 09/28/20: concern for septal hypertrophy Cardiac MRI confirming septal hypertrophy  emergency room December 15, 2019 for chest pain Work-up negative    Past Medical History:  Diagnosis Date   Cancer [facility])    Prostate, followed by Dr. Jacqlyn Larsen   Colon polyp    Coronary artery disease    a. 2005 s/p PCI LAD ([facility]); b 2012 s/p PCI OM2; c.10/2013 Cath: LAD 59m ISR, 4m, D1 90, D2 70, OM2 80 w/ patent stent;  d. 06/2019 Low risk MV; e. 07/2020 MV: EF 77%, no ischemia/infarct.   Gall stones    Hyperlipidemia    Hypertension    MI (myocardial infarction) ([place])    2015  Orthostatic hypotension    a. prev on midodrine/florinef/northera.   PAF (paroxysmal atrial fibrillation) ([facility])    a. on xarelto and amio (CHA2DS2VASc = 4).   Skin cancer    Past Surgical History:  Procedure Laterality Date   CARDIAC CAT[facility]IZATION  [facility]   [facility]   CATARACT EXTRACTION  Oct. 3, 2012   right eye   colonoscopy     COLONOSCOPY W/ POLYPECTOMY  2015   Dr Rayann Heman   COLONOSCOPY WITH PROPOFOL N/A 08/15/2016   Procedure: COLONOSCOPY WITH PROPOFOL;  Surgeon: [name], MD;  Location: [facility] ENDOSCOPY;  Service: Endoscopy;  Laterality: N/A;   CORONARY ANGIOPLASTY  2009   2005; s/p stent   ENTEROSCOPY N/A 09/22/2018   Procedure: ENTEROSCOPY;  Surgeon: Lin Landsman, MD;  Location: [facility] ENDOSCOPY;  Service: Gastroenterology;  Laterality: N/A;   ESOPHAGOGASTRODUODENOSCOPY (EGD) WITH PROPOFOL N/A 08/15/2016   Procedure: ESOPHAGOGASTRODUODENOSCOPY (EGD) WITH PROPOFOL;  Surgeon: [name], MD;  Location: [facility] ENDOSCOPY;  Service: Endoscopy;  Laterality: N/A;   GIVENS CAPSULE STUDY N/A 09/26/2016   Procedure: GIVENS CAPSULE STUDY;  Surgeon: [name], MD;  Location: [facility] ENDOSCOPY;  Service: Endoscopy;  Laterality: N/A;   HERNIA REPAIR  2013   LUMBAR SPINE SURGERY     UPPER GI ENDOSCOPY  Sept 2015   Dr Rayann Heman     Current Meds  Medication Sig   amiodarone (PACERONE) 100 MG tablet Take 1 tablet (100 mg total) by mouth daily.   azelastine (ASTELIN) 0.1 % nasal spray Place 2 sprays into both nostrils 2 (two) times daily. Use in each nostril as directed   fenofibrate micronized (ANTARA) 130 MG capsule TAKE 1 CAPSULE BY MOUTH  DAILY BEFORE BREAKFAST   finasteride (PROSCAR) 5 MG tablet Take 1 tablet (5 mg total) by mouth daily.   levobunolol (BETAGAN) 0.5 % ophthalmic solution Place 1 drop into the right eye 2 (two) times daily.   lisinopril (ZESTRIL) 10 MG tablet Take 1 tablet (10 mg total) by mouth daily.   loratadine (CLARITIN) 10 MG tablet Take 1 tablet (10 mg total) by mouth daily.   mirtazapine (REMERON) 15 MG tablet TAKE ONE TABLET EACH NIGHT   Multiple Vitamin (MULTIVITAMIN PO) Take 1 tablet by mouth daily.   nitroGLYCERIN (NITROSTAT) 0.4 MG SL tablet DISSOLVE UNDER THE TONGUE 1 TABLET EVERY 5 MINUTES AS NEEDED FOR CHEST PAIN   pantoprazole (PROTONIX) 40 MG tablet Take 40 mg by mouth daily.   pravastatin (PRAVACHOL) 40 MG tablet Take 1 tablet (40 mg total) by mouth at bedtime.   Rivaroxaban (XARELTO) 15 MG TABS tablet Take 1 tablet (15 mg total) by  mouth daily.     Allergies:   Bee venom, Sulfasalazine, Sulfa antibiotics, Warfarin, and Warfarin and related   Social History   Tobacco Use   Smoking status: Never   Smokeless tobacco: Never  Vaping Use   Vaping Use: Never used  Substance Use Topics   Alcohol use: No   Drug use: No     Family Hx: The patient's family history includes Colon cancer in his father; Coronary artery disease in his brother; Other (age of onset: 41) in his brother; Stroke in his mother.  ROS:   Please see the history of present illness.    Review of Systems  Constitutional: Negative.   HENT: Negative.    Respiratory: Negative.    Cardiovascular:  Positive for chest pain.  Gastrointestinal: Negative.   Musculoskeletal: Negative.   Neurological: Negative.   Psychiatric/Behavioral: Negative.  All other systems reviewed and are negative.  Labs/Other Tests and Data Reviewed:    Recent Labs: 08/16/2020: B Natriuretic Peptide 41.2 10/17/2020: ALT 12; TSH 8.04 12/27/2020: BUN 27; Creatinine, Ser 1.45; Potassium 4.4; Sodium 143 12/29/2020: Hemoglobin 10.9; Platelets 282   Recent Lipid Panel Lab Results  Component Value Date/Time   CHOL 110 06/28/2016 11:06 AM   CHOL 119 10/27/2013 06:09 AM   TRIG 80.0 06/28/2016 11:06 AM   TRIG 61 10/27/2013 06:09 AM   HDL 29.90 (L) 06/28/2016 11:06 AM   HDL 38 (L) 10/27/2013 06:09 AM   CHOLHDL 4 06/28/2016 11:06 AM   LDLCALC 64 06/28/2016 11:06 AM   LDLCALC 69 10/27/2013 06:09 AM    Wt Readings from Last 3 Encounters:  06/12/21 213 lb 2 oz (96.7 kg)  04/11/21 213 lb 4 oz (96.7 kg)  12/27/20 213 lb (96.6 kg)     Exam:    BP 130/80 (BP Location: Left Arm, Patient Position: Sitting, Cuff Size: Normal)   Pulse 83   Ht 6\' 4"  (1.93 m)   Wt 213 lb 2 oz (96.7 kg)   SpO2 97%   BMI 25.94 kg/m  Constitutional:  oriented to person, place, and time. No distress.  HENT:  Head: Grossly normal Eyes:  no discharge. No scleral icterus.  Neck: No JVD, no carotid  bruits  Cardiovascular: Regular rate and rhythm, no murmurs appreciated Pulmonary/Chest: Clear to auscultation bilaterally, no wheezes or rails Abdominal: Soft.  no distension.  no tenderness.  Musculoskeletal: Normal range of motion Neurological:  normal muscle tone. Coordination normal. No atrophy Skin: Skin warm and dry Psychiatric: normal affect, pleasant   ASSESSMENT & PLAN:    Severe orthostasis Associated with dramatic weeight loss in 2020 Previously on Northera ,  Florinef,  midodrine  As weight is stabilized, blood pressure stable, no changes made to his medications  Mixed hyperlipidemia  Cholesterol is at goal on the current lipid regimen. No changes to the medications were made.  Coronary artery disease involving native coronary artery of native heart without angina pectoris stress test no ischemia, 06/2019 and in 2021 Several episodes of chest pain throughout the year with visits to the emergency room -Work-up typically negative As he continues to have " severe" pain, most recently this past weekend, cardiac catheterization was discussed with him Last catheterization for chest pain symptoms 2015, medical management was recommended -Somewhat atypical presentation as able to ambulate and exert without recurrent symptoms, but chest pain comes on at rest -He will call us back if he would like further work-up including cardiac catheterization Recommend he try his nitro, unable to exclude symptoms from his septal thickening/hypertrophy at  Atrial fibrillation, unspecified type ([place]) on Xarelto, amiodarone event monitor no significant atrial fibrillation Maintaining normal sinus rhythm  Depression Weight has stabilized, no orthostasis symptoms  Chronic kidney disease (CKD), stage III (moderate) Stable numbers   Iron deficiency anemia, unspecified iron deficiency anemia type Baseline anemia    Total encounter time more than 25 minutes  Greater than 50% was spent in  counseling and coordination of care with the patient  Signed, Ida Rogue, MD  06/12/2021 5:21 PM    [place] Office [address] [place] [address], [place], [place] [address]

## 2021-06-18 ENCOUNTER — Other Ambulatory Visit: Payer: Self-pay | Admitting: Family Medicine

## 2021-06-18 DIAGNOSIS — Z760 Encounter for issue of repeat prescription: Secondary | ICD-10-CM

## 2021-07-02 ENCOUNTER — Ambulatory Visit: Payer: Medicare HMO | Admitting: Family Medicine

## 2021-07-19 ENCOUNTER — Other Ambulatory Visit: Payer: Self-pay

## 2021-07-19 ENCOUNTER — Encounter: Payer: Self-pay | Admitting: Family Medicine

## 2021-07-19 ENCOUNTER — Ambulatory Visit (INDEPENDENT_AMBULATORY_CARE_PROVIDER_SITE_OTHER): Payer: Medicare HMO | Admitting: Family Medicine

## 2021-07-19 DIAGNOSIS — F32A Depression, unspecified: Secondary | ICD-10-CM

## 2021-07-19 DIAGNOSIS — I25118 Atherosclerotic heart disease of native coronary artery with other forms of angina pectoris: Secondary | ICD-10-CM

## 2021-07-19 DIAGNOSIS — I10 Essential (primary) hypertension: Secondary | ICD-10-CM | POA: Diagnosis not present

## 2021-07-19 DIAGNOSIS — S62399D Other fracture of unspecified metacarpal bone, subsequent encounter for fracture with routine healing: Secondary | ICD-10-CM

## 2021-07-19 DIAGNOSIS — G4700 Insomnia, unspecified: Secondary | ICD-10-CM

## 2021-07-19 DIAGNOSIS — S62309A Unspecified fracture of unspecified metacarpal bone, initial encounter for closed fracture: Secondary | ICD-10-CM | POA: Insufficient documentation

## 2021-07-19 NOTE — Assessment & Plan Note (Signed)
Asymptomatic.  He will continue mirtazapine 15 mg once daily.

## 2021-07-19 NOTE — Assessment & Plan Note (Signed)
Chronic issues with trouble falling asleep.  Discussed sleep hygiene changes.  Advised to eliminate screen time the hour before bed.  Discussed eliminating caffeine intake after 1-2 PM.  Trazodone would not be a good option given that he is on amiodarone.  Advised we could consider alternative medication if his sleep hygiene changes did not help.

## 2021-07-19 NOTE — Assessment & Plan Note (Signed)
Generally well controlled.  He will continue lisinopril 10 mg once daily.  He will come in for lab work.

## 2021-07-19 NOTE — Assessment & Plan Note (Signed)
Patient reports he is doing well after treatment and evaluation with orthopedics.  He will continue to monitor.

## 2021-07-19 NOTE — Assessment & Plan Note (Signed)
Patient does report intermittent chest discomfort and chronic shortness of breath.  Certainly these things could be related to coronary artery disease.  I encouraged him to consider the cardiac catheterization particularly if he has worsening symptoms or more persistent issues with chest discomfort.  He will continue to follow with cardiology.

## 2021-07-19 NOTE — Progress Notes (Signed)
Virtual Visit via telephone Note  This visit type was conducted due to national recommendations for restrictions regarding the COVID-19 pandemic (e.g. social distancing).  This format is felt to be most appropriate for this patient at this time.  All issues noted in this document were discussed and addressed.  No physical exam was performed (except for noted visual exam findings with Video Visits).   I connected with William Taylor today at 10:30 AM EST by telephone and verified that I am speaking with the correct person using two identifiers. Location patient: home Location provider: home office Persons participating in the virtual visit: patient, provider  I discussed the limitations, risks, security and privacy concerns of performing an evaluation and management service by telephone and the availability of in person appointments. I also discussed with the patient that there may be a patient responsible charge related to this service. The patient expressed understanding and agreed to proceed.  Interactive audio and video telecommunications were attempted between this provider and patient, however failed, due to patient having technical difficulties OR patient did not have access to video capability.  We continued and completed visit with audio only.   Reason for visit: f/u  HPI: HYPERTENSION Disease Monitoring Home BP Monitoring 144/98 sitting then drops to 112/77 standing Chest pain- notes chronic and stable    Dyspnea- mild with exertion Medications Compliance-  lisinopril 10 mg daily. Lightheadedness-  no  Edema- no BMET    Component Value Date/Time   NA 143 12/27/2020 1118   NA 144 06/22/2019 1053   NA 138 02/27/2014 1335   K 4.4 12/27/2020 1118   K 4.1 02/27/2014 1335   CL 107 12/27/2020 1118   CL 106 02/27/2014 1335   CO2 28 12/27/2020 1118   CO2 26 02/27/2014 1335   GLUCOSE 96 12/27/2020 1118   GLUCOSE 109 (H) 02/27/2014 1335   BUN 27 (H) 12/27/2020 1118   BUN 20  06/22/2019 1053   BUN 26 (H) 02/27/2014 1335   CREATININE 1.45 12/27/2020 1118   CREATININE 1.48 (H) 02/27/2014 1335   CREATININE 1.22 12/13/2010 1040   CALCIUM 9.5 12/27/2020 1118   CALCIUM 8.5 02/27/2014 1335   GFRNONAA 47 (L) 09/08/2020 1617   GFRNONAA 43 (L) 02/27/2014 1335   GFRAA 54 (L) 01/13/2020 1711   GFRAA 50 (L) 02/27/2014 1335   Chronic chest pain/dyspnea: Patient has seen cardiology for this.  They discussed the potential for doing a catheterization though the patient reports cardiology left that up to him.  He notes only having chest discomfort 1 time in the last month or so.  He took nitroglycerin with good benefit.  Depression: Patient denies depression.  Notes he is taking Remeron.  Difficulty sleeping: Patient notes he tries to go to bed around 10 PM and oftentimes cannot fall asleep for 2 to 3 hours.  He does not typically wake up during the night other than to go to the bathroom.  He watches TV the hour before bed.  He does have caffeine with dinner.  No alcohol intake.  No anxiety.  Metacarpal fracture: Patient saw orthopedics for this.  He is out of the cast.  Notes it is progressively improving.   ROS: See pertinent positives and negatives per HPI.  Past Medical History:  Diagnosis Date   Cancer [facility])    Prostate, followed by Dr. Jacqlyn Larsen   Colon polyp    Coronary artery disease    a. 2005 s/p PCI LAD ([facility]); b 2012 s/p PCI OM2;  c.10/2013 Cath: LAD 67m ISR, 26m, D1 90, D2 70, OM2 80 w/ patent stent;  d. 06/2019 Low risk MV; e. 07/2020 MV: EF 77%, no ischemia/infarct.   Gall stones    Hyperlipidemia    Hypertension    MI (myocardial infarction) ([place])    2015   Orthostatic hypotension    a. prev on midodrine/florinef/northera.   PAF (paroxysmal atrial fibrillation) ([facility])    a. on xarelto and amio (CHA2DS2VASc = 4).   Skin cancer     Past Surgical History:  Procedure Laterality Date   C[facility]IAC [facility]IZATION  [facility]   CATARACT EXTRACTION  Oct. 3,  2012   right eye   colonoscopy     COLONOSCOPY W/ POLYPECTOMY  2015   Dr Rayann Heman   COLONOSCOPY WITH PROPOFOL N/A 08/15/2016   Procedure: COLONOSCOPY WITH PROPOFOL;  Surgeon: [name], MD;  Location: [facility] ENDOSCOPY;  Service: Endoscopy;  Laterality: N/A;   CORONARY ANGIOPLASTY  2009   2005; s/p stent   ENTEROSCOPY N/A 09/22/2018   Procedure: ENTEROSCOPY;  Surgeon: Lin Landsman, MD;  Location: [facility] ENDOSCOPY;  Service: Gastroenterology;  Laterality: N/A;   ESOPHAGOGASTRODUODENOSCOPY (EGD) WITH PROPOFOL N/A 08/15/2016   Procedure: ESOPHAGOGASTRODUODENOSCOPY (EGD) WITH PROPOFOL;  Surgeon: [name], MD;  Location: [facility] ENDOSCOPY;  Service: Endoscopy;  Laterality: N/A;   GIVENS CAPSULE STUDY N/A 09/26/2016   Procedure: GIVENS CAPSULE STUDY;  Surgeon: [name], MD;  Location: [facility] ENDOSCOPY;  Service: Endoscopy;  Laterality: N/A;   HERNIA REPAIR  2013   LUMBAR SPINE SURGERY     UPPER GI ENDOSCOPY  Sept 2015   Dr Rayann Heman    Family History  Problem Relation Age of Onset   Stroke Mother    Colon cancer Father    Coronary artery disease Brother    Other Brother 37       lymes dz    SOCIAL HX: nonsmoker   Current Outpatient Medications:    amiodarone (PACERONE) 100 MG tablet, Take 1 tablet (100 mg total) by mouth daily., Disp: 90 tablet, Rfl: 3   azelastine (ASTELIN) 0.1 % nasal spray, Place 2 sprays into both nostrils 2 (two) times daily. Use in each nostril as directed, Disp: 30 mL, Rfl: 1   fenofibrate micronized (ANTARA) 130 MG capsule, TAKE 1 CAPSULE BY MOUTH  DAILY BEFORE BREAKFAST, Disp: 90 capsule, Rfl: 3   finasteride (PROSCAR) 5 MG tablet, Take 1 tablet (5 mg total) by mouth daily., Disp: 90 tablet, Rfl: 3   levobunolol (BETAGAN) 0.5 % ophthalmic solution, Place 1 drop into the right eye 2 (two) times daily., Disp: , Rfl:    lisinopril (ZESTRIL) 10 MG tablet, Take 1 tablet (10 mg total) by mouth daily., Disp: 90 tablet, Rfl: 3   loratadine (CLARITIN) 10 MG tablet, Take 1  tablet (10 mg total) by mouth daily., Disp: 30 tablet, Rfl: 1   mirtazapine (REMERON) 15 MG tablet, TAKE ONE TABLET EACH NIGHT, Disp: 30 tablet, Rfl: 3   Multiple Vitamin (MULTIVITAMIN PO), Take 1 tablet by mouth daily., Disp: , Rfl:    nitroGLYCERIN (NITROSTAT) 0.4 MG SL tablet, DISSOLVE UNDER THE TONGUE 1 TABLET EVERY 5 MINUTES AS NEEDED FOR CHEST PAIN, Disp: 25 tablet, Rfl: 3   pantoprazole (PROTONIX) 40 MG tablet, Take 40 mg by mouth daily., Disp: , Rfl:    pravastatin (PRAVACHOL) 40 MG tablet, Take 1 tablet (40 mg total) by mouth at bedtime., Disp: 90 tablet, Rfl: 3   Rivaroxaban (XARELTO) 15 MG TABS tablet,  Take 1 tablet (15 mg total) by mouth daily., Disp: 90 tablet, Rfl: 3  EXAM: This was a telephone visit and thus no exam was completed.  ASSESSMENT AND PLAN:  Discussed the following assessment and plan:  Problem List Items Addressed This Visit     Coronary artery disease    Patient does report intermittent chest discomfort and chronic shortness of breath.  Certainly these things could be related to coronary artery disease.  I encouraged him to consider the cardiac catheterization particularly if he has worsening symptoms or more persistent issues with chest discomfort.  He will continue to follow with cardiology.      Depression    Asymptomatic.  He will continue mirtazapine 15 mg once daily.      Hypertension    Generally well controlled.  He will continue lisinopril 10 mg once daily.  He will come in for lab work.      Relevant Orders   Basic Metabolic Panel (BMET)   Insomnia    Chronic issues with trouble falling asleep.  Discussed sleep hygiene changes.  Advised to eliminate screen time the hour before bed.  Discussed eliminating caffeine intake after 1-2 PM.  Trazodone would not be a good option given that he is on amiodarone.  Advised we could consider alternative medication if his sleep hygiene changes did not help.      Metacarpal bone fracture    Patient reports  he is doing well after treatment and evaluation with orthopedics.  He will continue to monitor.       Return in about 1 week (around 07/26/2021) for Labs, 6 months PCP.   I discussed the assessment and treatment plan with the patient. The patient was provided an opportunity to ask questions and all were answered. The patient agreed with the plan and demonstrated an understanding of the instructions.   The patient was advised to call back or seek an in-person evaluation if the symptoms worsen or if the condition fails to improve as anticipated.  I provided 15 minutes of non-face-to-face time during this encounter.   Tommi Rumps, MD

## 2021-07-26 ENCOUNTER — Encounter: Payer: Self-pay | Admitting: Family Medicine

## 2021-07-27 NOTE — Telephone Encounter (Signed)
I called and spoke with patient and he is scheduled for labs.  Kody Brandl,cma

## 2021-07-31 ENCOUNTER — Other Ambulatory Visit: Payer: Self-pay

## 2021-07-31 ENCOUNTER — Other Ambulatory Visit (INDEPENDENT_AMBULATORY_CARE_PROVIDER_SITE_OTHER): Payer: Medicare HMO

## 2021-07-31 DIAGNOSIS — I10 Essential (primary) hypertension: Secondary | ICD-10-CM | POA: Diagnosis not present

## 2021-08-01 LAB — BASIC METABOLIC PANEL
BUN: 30 mg/dL — ABNORMAL HIGH (ref 6–23)
CO2: 27 mEq/L (ref 19–32)
Calcium: 9.9 mg/dL (ref 8.4–10.5)
Chloride: 107 mEq/L (ref 96–112)
Creatinine, Ser: 1.58 mg/dL — ABNORMAL HIGH (ref 0.40–1.50)
GFR: 38.5 mL/min — ABNORMAL LOW (ref >60.00)
Glucose, Bld: 113 mg/dL — ABNORMAL HIGH (ref 70–99)
Potassium: 4.5 mEq/L (ref 3.5–5.1)
Sodium: 143 mEq/L (ref 135–145)

## 2021-08-02 ENCOUNTER — Ambulatory Visit: Payer: Medicare HMO | Admitting: Urology

## 2021-08-02 ENCOUNTER — Other Ambulatory Visit: Payer: Self-pay

## 2021-08-02 ENCOUNTER — Encounter: Payer: Self-pay | Admitting: Urology

## 2021-08-02 VITALS — BP 148/89 | HR 99 | Ht 76.0 in | Wt 205.0 lb

## 2021-08-02 DIAGNOSIS — N401 Enlarged prostate with lower urinary tract symptoms: Secondary | ICD-10-CM

## 2021-08-02 DIAGNOSIS — C61 Malignant neoplasm of prostate: Secondary | ICD-10-CM

## 2021-08-02 MED ORDER — FINASTERIDE 5 MG PO TABS: 5.0000 mg | ORAL_TABLET | Freq: Every day | ORAL | 3 refills | Status: DC

## 2021-08-02 NOTE — Progress Notes (Signed)
08/02/2021 11:18 AM   William Taylor 1930-01-16 [id number]  Referring provider: [name], MD [address] [place],   [address]  Chief Complaint  Patient presents with   Prostate Cancer    Urologic history: 1. 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 Jacqlyn Larsen; PSA 5.7; 77 g               -focus Gleason 3+3 RLB             -Elected active surveillance; started finasteride             -Baseline PSA 0.1-0.6 since 2015  HPI: 87 y.o. male presents for annual follow-up.  At last years visit had noted increased daytime frequency and urinary hesitancy and intermittency during nighttime voids and he was given a trial of silodosin 4 mg daily He does not remember taking the silodosin Presently he states his voiding symptoms are stable and not bothersome enough that he desires to take an additional medication PSA has been undetectable since 07/2020   PMH: Past Medical History:  Diagnosis Date   Cancer [facility])    Prostate, followed by Dr. Jacqlyn Larsen   Colon polyp    Coronary artery disease    a. 2005 s/p PCI LAD ([facility]); b 2012 s/p PCI OM2; c.10/2013 Cath: LAD 8m ISR, 58m, D1 90, D2 70, OM2 80 w/ patent stent;  d. 06/2019 Low risk MV; e. 07/2020 MV: EF 77%, no ischemia/infarct.   Gall stones    Hyperlipidemia    Hypertension    MI (myocardial infarction) ([place])    2015   Orthostatic hypotension    a. prev on midodrine/florinef/northera.   PAF (paroxysmal atrial fibrillation) ([facility])    a. on xarelto and amio (CHA2DS2VASc = 4).   Skin cancer     Surgical History: Past Surgical History:  Procedure Laterality Date   CARDIAC CATHETERIZATION  10/2013   [facility]   CATARACT EXTRACTION  Oct. 3, 2012   right eye   colonoscopy     COLONOSCOPY W/ POLYPECTOMY  2015   Dr Rayann Heman   COLONOSCOPY WITH PROPOFOL N/A 08/15/2016   Procedure: COLONOSCOPY WITH PROPOFOL;  Surgeon: [name], MD;  Location[facility] ENDOSCOPY;  Service: Endoscopy;   Laterality: N/A;   CORONARY ANGIOPLASTY  2009   2005; s/p stent   ENTEROSCOPY N/A 09/22/2018   Procedure: ENTEROSCOPY;  Surgeon: Lin Landsman, MD;  Location: [facility] ENDOSCOPY;  Service: Gastroenterology;  Laterality: N/A;   ESOPHAGOGASTRODUODENOSCOPY (EGD) WITH PROPOFOL N/A 08/15/2016   Procedure: ESOPHAGOGASTRODUODENOSCOPY (EGD) WITH PROPOFOL;  Surgeon: [name], MD;  Location: [facility] ENDOSCOPY;  Service: Endoscopy;  Laterality: N/A;   GIVENS CAPSULE STUDY N/A 09/26/2016   Procedure: GIVENS CAPSULE STUDY;  Surgeon: [name], MD;  Location: [facility] ENDOSCOPY;  Service: Endoscopy;  Laterality: N/A;   HERNIA REPAIR  2013   LUMBAR SPINE SURGERY     UPPER GI ENDOSCOPY  Sept 2015   Dr Rudean Hitt Medications:  Allergies as of 08/02/2021       Reactions   Bee Venom Swelling   Sulfasalazine Hives, Swelling   Sulfa Antibiotics Hives, Swelling   Warfarin    Warfarin And Related Other (See Comments)   Chest pain        Medication List        Accurate as of August 02, 2021 11:18 AM. If you have any questions,  ask your nurse or doctor.          amiodarone 100 MG tablet Commonly known as: PACERONE Take 1 tablet (100 mg total) by mouth daily.   azelastine 0.1 % nasal spray Commonly known as: ASTELIN Place 2 sprays into both nostrils 2 (two) times daily. Use in each nostril as directed   fenofibrate micronized 130 MG capsule Commonly known as: ANTARA TAKE 1 CAPSULE BY MOUTH  DAILY BEFORE BREAKFAST   finasteride 5 MG tablet Commonly known as: PROSCAR Take 1 tablet (5 mg total) by mouth daily.   levobunolol 0.5 % ophthalmic solution Commonly known as: BETAGAN Place 1 drop into the right eye 2 (two) times daily.   lisinopril 10 MG tablet Commonly known as: ZESTRIL Take 1 tablet (10 mg total) by mouth daily.   loratadine 10 MG tablet Commonly known as: CLARITIN Take 1 tablet (10 mg total) by mouth daily.   mirtazapine 15 MG tablet Commonly known as: REMERON TAKE  ONE TABLET EACH NIGHT   MULTIVITAMIN PO Take 1 tablet by mouth daily.   nitroGLYCERIN 0.4 MG SL tablet Commonly known as: NITROSTAT DISSOLVE UNDER THE TONGUE 1 TABLET EVERY 5 MINUTES AS NEEDED FOR CHEST PAIN   pantoprazole 40 MG tablet Commonly known as: PROTONIX Take 40 mg by mouth daily.   pravastatin 40 MG tablet Commonly known as: PRAVACHOL Take 1 tablet (40 mg total) by mouth at bedtime.   Rivaroxaban 15 MG Tabs tablet Commonly known as: Xarelto Take 1 tablet (15 mg total) by mouth daily.        Allergies:  Allergies  Allergen Reactions   Bee Venom Swelling   Sulfasalazine Hives and Swelling   Sulfa Antibiotics Hives and Swelling   Warfarin    Warfarin And Related Other (See Comments)    Chest pain    Family History: Family History  Problem Relation Age of Onset   Stroke Mother    Colon cancer Father    Coronary artery disease Brother    Other Brother 81       lymes dz    Social History:  reports that he has never smoked. He has never used smokeless tobacco. He reports that he does not drink alcohol and does not use drugs.   Physical Exam: BP (!) 148/89    Pulse 99    Ht 6\' 4"  (1.93 m)    Wt 205 lb (93 kg)    BMI 24.95 kg/m   Constitutional:  Alert and oriented, No acute distress. Respiratory: Normal respiratory effort, no increased work of breathing. Psychiatric: Normal mood and affect.   Assessment & Plan:    1.  BPH with LUTS Stable Finasteride refilled  2.  Low risk prostate cancer Based on age and stability of PSA would recommend discontinuing PSA checks and he is in agreement  Continue annual follow-up   Abbie Sons, MD  [facility] [address] [place], [place] [address] [phone]

## 2021-08-06 ENCOUNTER — Telehealth: Payer: Self-pay | Admitting: Emergency Medicine

## 2021-08-06 NOTE — Telephone Encounter (Signed)
Called patient to speak with him regarding appointment request sent through [place]. Patient mentioned chest pain he is still having in message, and also had question about xarelto. No answer, lmtcb

## 2021-08-08 NOTE — Telephone Encounter (Signed)
Pt returned call from 12/19. Reports that he has been having chest pain off and on that is substernal, pressure in nature. Nitro offers some relief but it doesn't last. Also reports that he gets out of breath "doing nothing".   States that last time he saw Dr. Rockey Situ, it was offered that he could have a heart cath done, but he declined at the time. He has since seen his PCP on 12/1 and talked with him about the cath and his PCP agrees that it would be a good idea for him to have the cath.   Patient has not been seen by Dr. Rockey Situ since 06/12/21, explained to patient that he has to have a visit within 30 days of the cath. Pt verbalized understanding.   Appt made for 09/05/21 at 8:20 am

## 2021-08-10 ENCOUNTER — Other Ambulatory Visit: Payer: Self-pay | Admitting: Gastroenterology

## 2021-08-30 ENCOUNTER — Telehealth: Payer: Self-pay | Admitting: Family Medicine

## 2021-08-30 NOTE — Telephone Encounter (Signed)
I called and spoke with the patients wife and I scheduled her with the urgent care on [address] at 9 am in the morning, I also informed her that she needed to watch his Oxygen, she has a pulse ox and I informed her that if the patients oxygen dropped below 90 to get him to the ER and she understood.  Zakary Kimura,cma

## 2021-08-30 NOTE — Telephone Encounter (Signed)
(  Newest Message First)  You routed conversation to Thressa Sheller, CMA 6 minutes ago (1:21 PM)   [name]  Patient Appointment Schedule Request Pool 46 minutes ago (12:41 PM)   Appointment Request From: [name]   With Provider: Tommi Rumps, MD [facility]]   Preferred Date Range: Any   Preferred Times: Any Time   Reason for visit: Video Visit with your Primary Care Provider   Comments: Jacobo has been sick for 2-1/2 days. Has been totally in bed, not eating, and drinking very little. He says his head, back, and chest hurts. He began with a cough that sounded like it was caused by drainage. He doesn't have a fever. Our phone # is [phone]. Zara Council

## 2021-08-31 ENCOUNTER — Other Ambulatory Visit: Payer: Self-pay

## 2021-08-31 ENCOUNTER — Ambulatory Visit
Admission: RE | Admit: 2021-08-31 | Discharge: 2021-08-31 | Payer: Medicare HMO | Source: Ambulatory Visit | Attending: Emergency Medicine | Admitting: Emergency Medicine

## 2021-08-31 ENCOUNTER — Inpatient Hospital Stay
Admission: EM | Admit: 2021-08-31 | Discharge: 2021-09-04 | DRG: 177 | Disposition: A | Discharge status: Home / Self Care | Payer: Medicare HMO | Source: Ambulatory Visit | Attending: Internal Medicine | Admitting: Internal Medicine

## 2021-08-31 ENCOUNTER — Encounter: Payer: Self-pay | Admitting: Emergency Medicine

## 2021-08-31 ENCOUNTER — Emergency Department: Payer: Medicare HMO

## 2021-08-31 VITALS — BP 135/83 | HR 96 | Temp 98.0°F | Resp 18

## 2021-08-31 DIAGNOSIS — Z8249 Family history of ischemic heart disease and other diseases of the circulatory system: Secondary | ICD-10-CM | POA: Diagnosis not present

## 2021-08-31 DIAGNOSIS — Z79899 Other long term (current) drug therapy: Secondary | ICD-10-CM

## 2021-08-31 DIAGNOSIS — Z955 Presence of coronary angioplasty implant and graft: Secondary | ICD-10-CM

## 2021-08-31 DIAGNOSIS — Z888 Allergy status to other drugs, medicaments and biological substances status: Secondary | ICD-10-CM

## 2021-08-31 DIAGNOSIS — Z8546 Personal history of malignant neoplasm of prostate: Secondary | ICD-10-CM

## 2021-08-31 DIAGNOSIS — Z85828 Personal history of other malignant neoplasm of skin: Secondary | ICD-10-CM | POA: Diagnosis not present

## 2021-08-31 DIAGNOSIS — K219 Gastro-esophageal reflux disease without esophagitis: Secondary | ICD-10-CM | POA: Diagnosis present

## 2021-08-31 DIAGNOSIS — Z86711 Personal history of pulmonary embolism: Secondary | ICD-10-CM | POA: Diagnosis not present

## 2021-08-31 DIAGNOSIS — N1831 Chronic kidney disease, stage 3a: Secondary | ICD-10-CM | POA: Diagnosis present

## 2021-08-31 DIAGNOSIS — I951 Orthostatic hypotension: Secondary | ICD-10-CM | POA: Diagnosis present

## 2021-08-31 DIAGNOSIS — J069 Acute upper respiratory infection, unspecified: Secondary | ICD-10-CM | POA: Diagnosis present

## 2021-08-31 DIAGNOSIS — I252 Old myocardial infarction: Secondary | ICD-10-CM

## 2021-08-31 DIAGNOSIS — R110 Nausea: Secondary | ICD-10-CM

## 2021-08-31 DIAGNOSIS — Z91030 Bee allergy status: Secondary | ICD-10-CM | POA: Diagnosis not present

## 2021-08-31 DIAGNOSIS — I480 Paroxysmal atrial fibrillation: Secondary | ICD-10-CM | POA: Diagnosis present

## 2021-08-31 DIAGNOSIS — R55 Syncope and collapse: Secondary | ICD-10-CM

## 2021-08-31 DIAGNOSIS — Z823 Family history of stroke: Secondary | ICD-10-CM

## 2021-08-31 DIAGNOSIS — Z7901 Long term (current) use of anticoagulants: Secondary | ICD-10-CM

## 2021-08-31 DIAGNOSIS — F32A Depression, unspecified: Secondary | ICD-10-CM | POA: Diagnosis present

## 2021-08-31 DIAGNOSIS — U071 COVID-19: Principal | ICD-10-CM | POA: Diagnosis present

## 2021-08-31 DIAGNOSIS — Z8719 Personal history of other diseases of the digestive system: Secondary | ICD-10-CM

## 2021-08-31 DIAGNOSIS — I1310 Hypertensive heart and chronic kidney disease without heart failure, with stage 1 through stage 4 chronic kidney disease, or unspecified chronic kidney disease: Secondary | ICD-10-CM | POA: Diagnosis present

## 2021-08-31 DIAGNOSIS — E782 Mixed hyperlipidemia: Secondary | ICD-10-CM | POA: Diagnosis present

## 2021-08-31 DIAGNOSIS — R5381 Other malaise: Secondary | ICD-10-CM

## 2021-08-31 DIAGNOSIS — Z800 Family history of malignant neoplasm of digestive organs: Secondary | ICD-10-CM | POA: Diagnosis not present

## 2021-08-31 DIAGNOSIS — Z882 Allergy status to sulfonamides status: Secondary | ICD-10-CM | POA: Diagnosis not present

## 2021-08-31 DIAGNOSIS — I10 Essential (primary) hypertension: Secondary | ICD-10-CM | POA: Diagnosis present

## 2021-08-31 DIAGNOSIS — B349 Viral infection, unspecified: Secondary | ICD-10-CM

## 2021-08-31 DIAGNOSIS — I2510 Atherosclerotic heart disease of native coronary artery without angina pectoris: Secondary | ICD-10-CM | POA: Diagnosis present

## 2021-08-31 DIAGNOSIS — J9601 Acute respiratory failure with hypoxia: Secondary | ICD-10-CM | POA: Diagnosis present

## 2021-08-31 DIAGNOSIS — D509 Iron deficiency anemia, unspecified: Secondary | ICD-10-CM | POA: Diagnosis present

## 2021-08-31 DIAGNOSIS — R058 Other specified cough: Secondary | ICD-10-CM

## 2021-08-31 DIAGNOSIS — N183 Chronic kidney disease, stage 3 unspecified: Secondary | ICD-10-CM | POA: Diagnosis present

## 2021-08-31 DIAGNOSIS — R6883 Chills (without fever): Secondary | ICD-10-CM | POA: Diagnosis present

## 2021-08-31 LAB — CBC WITH DIFFERENTIAL/PLATELET
Abs Immature Granulocytes: 0.04 10*3/uL (ref 0.00–0.07)
Basophils Absolute: 0 10*3/uL (ref 0.0–0.1)
Basophils Relative: 0 %
Eosinophils Absolute: 0 10*3/uL (ref 0.0–0.5)
Eosinophils Relative: 0 %
HCT: 31.1 % — ABNORMAL LOW (ref 39.0–52.0)
Hemoglobin: 10.3 g/dL — ABNORMAL LOW (ref 13.0–17.0)
Immature Granulocytes: 1 %
Lymphocytes Relative: 15 %
Lymphs Abs: 1 10*3/uL (ref 0.7–4.0)
MCH: 33.1 pg (ref 26.0–34.0)
MCHC: 33.1 g/dL (ref 30.0–36.0)
MCV: 100 fL (ref 80.0–100.0)
Monocytes Absolute: 0.8 10*3/uL (ref 0.1–1.0)
Monocytes Relative: 12 %
Neutro Abs: 4.9 10*3/uL (ref 1.7–7.7)
Neutrophils Relative %: 72 %
Platelets: 182 10*3/uL (ref 150–400)
RBC: 3.11 MIL/uL — ABNORMAL LOW (ref 4.22–5.81)
RDW: 15.9 % — ABNORMAL HIGH (ref 11.5–15.5)
WBC: 6.8 10*3/uL (ref 4.0–10.5)
nRBC: 0 % (ref 0.0–0.2)

## 2021-08-31 LAB — TROPONIN I (HIGH SENSITIVITY)
Troponin I (High Sensitivity): 15 ng/L (ref <18)
Troponin I (High Sensitivity): 14 ng/L (ref <18)

## 2021-08-31 LAB — COMPREHENSIVE METABOLIC PANEL
ALT: 16 U/L (ref 0–44)
AST: 45 U/L — ABNORMAL HIGH (ref 15–41)
Albumin: 3.9 g/dL (ref 3.5–5.0)
Alkaline Phosphatase: 32 U/L — ABNORMAL LOW (ref 38–126)
Anion gap: 7 (ref 5–15)
BUN: 25 mg/dL — ABNORMAL HIGH (ref 8–23)
CO2: 24 mmol/L (ref 22–32)
Calcium: 9.1 mg/dL (ref 8.9–10.3)
Chloride: 106 mmol/L (ref 98–111)
Creatinine, Ser: 1.56 mg/dL — ABNORMAL HIGH (ref 0.61–1.24)
GFR, Estimated: 42 mL/min — ABNORMAL LOW (ref >60)
Glucose, Bld: 121 mg/dL — ABNORMAL HIGH (ref 70–99)
Potassium: 3.9 mmol/L (ref 3.5–5.1)
Sodium: 137 mmol/L (ref 135–145)
Total Bilirubin: 2.3 mg/dL — ABNORMAL HIGH (ref 0.3–1.2)
Total Protein: 6.8 g/dL (ref 6.5–8.1)

## 2021-08-31 LAB — RESP PANEL BY RT-PCR (FLU A&B, COVID) ARPGX2
Influenza A by PCR: NEGATIVE
Influenza B by PCR: NEGATIVE
SARS Coronavirus 2 by RT PCR: POSITIVE — AB

## 2021-08-31 LAB — BRAIN NATRIURETIC PEPTIDE: B Natriuretic Peptide: 29.4 pg/mL (ref 0.0–100.0)

## 2021-08-31 LAB — POCT INFLUENZA A/B
Influenza A, POC: NEGATIVE
Influenza B, POC: NEGATIVE

## 2021-08-31 LAB — C-REACTIVE PROTEIN: CRP: 5.2 mg/dL — ABNORMAL HIGH (ref <1.0)

## 2021-08-31 MED ORDER — METHYLPREDNISOLONE SODIUM SUCC 40 MG IJ SOLR
40.0000 mg | Freq: Two times a day (BID) | INTRAMUSCULAR | Status: DC
  Administered 2021-09-01 – 2021-09-02 (×3): 40 mg via INTRAVENOUS
  Filled 2021-08-31 (×3): qty 1

## 2021-08-31 MED ORDER — IPRATROPIUM BROMIDE HFA 17 MCG/ACT IN AERS
2.0000 | INHALATION_SPRAY | RESPIRATORY_TRACT | Status: DC
  Administered 2021-08-31 – 2021-09-04 (×21): 2 via RESPIRATORY_TRACT
  Filled 2021-08-31: qty 12.9

## 2021-08-31 MED ORDER — ACETAMINOPHEN 325 MG PO TABS: 650.0000 mg | ORAL_TABLET | Freq: Four times a day (QID) | ORAL | Status: DC | PRN

## 2021-08-31 MED ORDER — FINASTERIDE 5 MG PO TABS
5.0000 mg | ORAL_TABLET | Freq: Every day | ORAL | Status: DC
Start: 2021-08-31 — End: 2021-09-04
  Administered 2021-08-31 – 2021-09-04 (×5): 5 mg via ORAL
  Filled 2021-08-31 (×5): qty 1

## 2021-08-31 MED ORDER — NITROGLYCERIN 0.4 MG SL SUBL: 0.4000 mg | SUBLINGUAL_TABLET | SUBLINGUAL | Status: DC | PRN

## 2021-08-31 MED ORDER — HYDRALAZINE HCL 20 MG/ML IJ SOLN: 5.0000 mg | INTRAMUSCULAR | Status: DC | PRN

## 2021-08-31 MED ORDER — SODIUM CHLORIDE 0.9 % IV SOLN
100.0000 mg | Freq: Every day | INTRAVENOUS | Status: AC
  Administered 2021-09-01 – 2021-09-04 (×4): 100 mg via INTRAVENOUS
  Filled 2021-08-31 (×4): qty 100

## 2021-08-31 MED ORDER — AMIODARONE HCL 200 MG PO TABS
100.0000 mg | ORAL_TABLET | Freq: Every day | ORAL | Status: DC
  Administered 2021-08-31 – 2021-09-04 (×5): 100 mg via ORAL
  Filled 2021-08-31 (×5): qty 1

## 2021-08-31 MED ORDER — METHYLPREDNISOLONE SODIUM SUCC 125 MG IJ SOLR
125.0000 mg | Freq: Once | INTRAMUSCULAR | Status: AC
  Administered 2021-08-31: 125 mg via INTRAVENOUS
  Filled 2021-08-31: qty 2

## 2021-08-31 MED ORDER — SODIUM CHLORIDE 0.9 % IV BOLUS
500.0000 mL | Freq: Once | INTRAVENOUS | Status: AC
Start: 2021-08-31 — End: 2021-08-31
  Administered 2021-08-31: 500 mL via INTRAVENOUS

## 2021-08-31 MED ORDER — ALBUTEROL SULFATE HFA 108 (90 BASE) MCG/ACT IN AERS
2.0000 | INHALATION_SPRAY | RESPIRATORY_TRACT | Status: DC | PRN
  Filled 2021-08-31: qty 6.7

## 2021-08-31 MED ORDER — PRAVASTATIN SODIUM 20 MG PO TABS
40.0000 mg | ORAL_TABLET | Freq: Every day | ORAL | Status: DC
  Administered 2021-08-31 – 2021-09-03 (×4): 40 mg via ORAL
  Filled 2021-08-31 (×4): qty 2

## 2021-08-31 MED ORDER — FENOFIBRATE 160 MG PO TABS
160.0000 mg | ORAL_TABLET | Freq: Every day | ORAL | Status: DC
  Administered 2021-08-31 – 2021-09-04 (×5): 160 mg via ORAL
  Filled 2021-08-31 (×5): qty 1

## 2021-08-31 MED ORDER — LORATADINE 10 MG PO TABS
10.0000 mg | ORAL_TABLET | Freq: Every day | ORAL | Status: DC
  Administered 2021-08-31 – 2021-09-04 (×5): 10 mg via ORAL
  Filled 2021-08-31 (×5): qty 1

## 2021-08-31 MED ORDER — AZELASTINE HCL 0.1 % NA SOLN
2.0000 | Freq: Two times a day (BID) | NASAL | Status: DC
  Administered 2021-08-31 – 2021-09-04 (×9): 2 via NASAL
  Filled 2021-08-31: qty 30

## 2021-08-31 MED ORDER — PANTOPRAZOLE SODIUM 40 MG PO TBEC
40.0000 mg | DELAYED_RELEASE_TABLET | Freq: Two times a day (BID) | ORAL | Status: DC
  Administered 2021-08-31 – 2021-09-04 (×9): 40 mg via ORAL
  Filled 2021-08-31 (×9): qty 1

## 2021-08-31 MED ORDER — ADULT MULTIVITAMIN W/MINERALS CH
1.0000 | ORAL_TABLET | Freq: Every day | ORAL | Status: DC
  Administered 2021-09-01 – 2021-09-04 (×4): 1 via ORAL
  Filled 2021-08-31 (×4): qty 1

## 2021-08-31 MED ORDER — MIRTAZAPINE 15 MG PO TABS
15.0000 mg | ORAL_TABLET | Freq: Every day | ORAL | Status: DC
  Administered 2021-08-31 – 2021-09-03 (×4): 15 mg via ORAL
  Filled 2021-08-31 (×4): qty 1

## 2021-08-31 MED ORDER — ONDANSETRON 4 MG PO TBDP
4.0000 mg | ORAL_TABLET | Freq: Once | ORAL | Status: AC
  Administered 2021-08-31: 4 mg via ORAL

## 2021-08-31 MED ORDER — ONDANSETRON HCL 4 MG/2ML IJ SOLN: 4.0000 mg | Freq: Three times a day (TID) | INTRAMUSCULAR | Status: DC | PRN

## 2021-08-31 MED ORDER — RIVAROXABAN 15 MG PO TABS
15.0000 mg | ORAL_TABLET | Freq: Every day | ORAL | Status: DC
Start: 2021-08-31 — End: 2021-09-04
  Administered 2021-08-31 – 2021-09-03 (×4): 15 mg via ORAL
  Filled 2021-08-31 (×5): qty 1

## 2021-08-31 MED ORDER — DM-GUAIFENESIN ER 30-600 MG PO TB12: 1.0000 | ORAL_TABLET | Freq: Two times a day (BID) | ORAL | Status: DC | PRN

## 2021-08-31 MED ORDER — MENTHOL 3 MG MT LOZG
1.0000 | LOZENGE | OROMUCOSAL | Status: DC | PRN
  Filled 2021-08-31: qty 9

## 2021-08-31 MED ORDER — SODIUM CHLORIDE 0.9 % IV SOLN
200.0000 mg | Freq: Once | INTRAVENOUS | Status: AC
  Administered 2021-08-31: 200 mg via INTRAVENOUS
  Filled 2021-08-31: qty 40

## 2021-08-31 MED ORDER — LEVOBUNOLOL HCL 0.5 % OP SOLN
1.0000 [drp] | Freq: Two times a day (BID) | OPHTHALMIC | Status: DC
  Administered 2021-08-31 – 2021-09-04 (×9): 1 [drp] via OPHTHALMIC
  Filled 2021-08-31: qty 5

## 2021-08-31 NOTE — ED Provider Notes (Signed)
[facility] Provider Note    Event Date/Time   First MD Initiated Contact with Patient 08/31/21 1037     (approximate)   History   Chills   HPI  MOHAMED PORTLOCK is a 88 y.o. male with history of MI, paroxysmal A. fib on Xarelto and amiodarone, orthostatic hypotension who comes in with concerns for multiple symptoms.  Patient's had 2 to 3 days of cough body aches headache sore throat congestion, runny nose.  Patient stated over the last 2 days has had difficulty not wanting to eat or drink.  He had a episode where he was shaving and he felt like he was going to pass out so that he sat down.  He went to the urgent care who tested him for flu was negative.  There he started to feel like he might pass out and they laid him down and he started to feel better.  Patient has IV fluid going in.  Patient denies any significant abdominal pain.  Does report a little bit of chest discomfort, shortness of breath.  He reports being compliant with his Xarelto.  No falls or hitting his head.  Denies any dysuria.  Physical Exam   Triage Vital Signs: ED Triage Vitals  Enc Vitals Group     BP 08/31/21 1005 129/84     Pulse Rate 08/31/21 1005 90     Resp 08/31/21 1005 16     Temp 08/31/21 1005 98.4 F (36.9 C)     Temp Source 08/31/21 1005 Oral     SpO2 08/31/21 1005 95 %     Weight 08/31/21 1003 205 lb 0.4 oz (93 kg)     Height 08/31/21 1003 6\' 4"  (1.93 m)     Head Circumference --      Peak Flow --      Pain Score 08/31/21 1003 0     Pain Loc --      Pain Edu? --      Excl. in [place]? --     Most recent vital signs: Vitals:   08/31/21 1005  BP: 129/84  Pulse: 90  Resp: 16  Temp: 98.4 F (36.9 C)  SpO2: 95%     General: Awake, no distress.  Elderly male. CV:  Good peripheral perfusion.  Normal rate Resp:  Normal effort.  Clear lungs Abd:  No distention.  Soft and nontender Other:  Oropharynx without any exudates, uvula is midline  ED Results / Procedures /  Treatments   Labs (all labs ordered are listed, but only abnormal results are displayed) Labs Reviewed  RESP PANEL BY RT-PCR (FLU A&B, COVID) ARPGX2 - Abnormal; Notable for the following components:      Result Value   SARS Coronavirus 2 by RT PCR POSITIVE (*)    All other components within normal limits  CBC WITH DIFFERENTIAL/PLATELET  COMPREHENSIVE METABOLIC PANEL  URINALYSIS, ROUTINE W REFLEX MICROSCOPIC  TROPONIN I (HIGH SENSITIVITY)     EKG  My interpretation of EKG: Normal sinus rate of 87 without any ST elevation or T wave inversions, normal intervals    RADIOLOGY I have reviewed the xray personally and agree with radiology read No Evidence of pneumonia  PROCEDURES:  Critical Care performed: Yes, see critical care procedure note(s)  .Critical Care Performed by: Vanessa Lapwai, MD Authorized by: Vanessa Lebanon, MD   Critical care provider statement:    Critical care time (minutes):  30   Critical care was necessary to treat  or prevent imminent or life-threatening deterioration of the following conditions:  Respiratory failure   Critical care was time spent personally by me on the following activities:  Development of treatment plan with patient or surrogate, discussions with consultants, evaluation of patient's response to treatment, examination of patient, ordering and review of laboratory studies, ordering and review of radiographic studies, ordering and performing treatments and interventions, pulse oximetry, re-evaluation of patient's condition and review of old charts   [facility] ED: Medications  menthol-cetylpyridinium (CEPACOL) lozenge 3 mg (has no administration in time range)  methylPREDNISolone sodium succinate (SOLU-MEDROL) 125 mg/2 mL injection 125 mg (has no administration in time range)  remdesivir 200 mg in sodium chloride 0.9% 250 mL IVPB (has no administration in time range)    Followed by  remdesivir 100 mg in sodium chloride 0.9 % 100  mL IVPB (has no administration in time range)     IMPRESSION / MDM / [place] / ED COURSE  I reviewed the triage vital signs and the nursing notes.  Patient with multiple comorbidities including A. fib on Eliquis who comes in with multiple symptoms Differential diagnosis includes, but is not limited to, flu, COVID, pneumonia.  Given patient's age and comorbidities we will get EKG, cardiac markers evaluate for ACS, chest x-ray to evaluate for any pneumonia, labs to evaluate for any electrolyte abnormalities, AKI.  Doubt PE given patient is on blood thinner.  Patient is COVID-positive  Patient's labs are reassuring.  Slight elevation of his LFTs.  Denies daily drinking.  He has a soft and nontender abdominal exam in the right upper quadrant.  Unlikely gallbladder pathology.  Patient was ambulated and oxygen levels desatted down to 83% given this we will discuss hospital team for admission and start patient on Solu-Medrol, remdesivir.  Patient will require oxygen with ambulation.  Patient and family felt comfortable comfortable with this plan   FINAL CLINICAL IMPRESSION(S) / ED DIAGNOSES   Final diagnoses:  COVID-19  Acute respiratory failure with hypoxia ([place])     Rx / DC Orders   ED Discharge Orders     None        Note:  This document was prepared using Dragon voice recognition software and may include unintentional dictation errors.   Vanessa Buffalo, MD 08/31/21 1426

## 2021-08-31 NOTE — ED Triage Notes (Signed)
Pt presents with cough, bodyaches, HA, ST and runny nose x 2 days.

## 2021-08-31 NOTE — Progress Notes (Signed)
Remdesivir - Pharmacy Brief Note   O:  ALT: 16 CXR: "No active cardiopulmonary disease" SpO2: Desaturating with ambulation   A/P:  08/31/21 SARS-CoV-2 PCR (+)  Remdesivir 200 mg IVPB once followed by 100 mg IVPB daily x 4 days.   William Taylor  08/31/2021 2:14 PM

## 2021-08-31 NOTE — ED Notes (Signed)
Transport at bedside, attempting to print ticket to ride.

## 2021-08-31 NOTE — ED Notes (Signed)
Patient is being discharged from the Urgent Care and sent to the Emergency Department via EMS . Per K TATE NP, patient is in need of higher level of care due to need for further eval. Patient is aware and verbalizes understanding of plan of care.  Vitals:   08/31/21 0936 08/31/21 0942  BP: 122/79 135/83  Pulse:    Resp:    Temp:    SpO2: 93% 95%

## 2021-08-31 NOTE — ED Notes (Signed)
Pt. Ambulated by this tech, O2 went as low as 83% and increased after sitting to 95%

## 2021-08-31 NOTE — ED Triage Notes (Addendum)
C/O 2 day s of c/o cough, body aches, sore throat and fatigue.  Presented to Urgent Care, where they noted a drop in oxygen saturation an orthostatic change, per EMS report.  IV started at urgent care and 100 cc ns given.  VS:  136-98 93 P 96% ra.  Patient is AAOx3.  Skin warm and dry. NAD

## 2021-08-31 NOTE — ED Notes (Signed)
See triage note  presents via EMS from UC with body aches  weakness and decreased appetite  states he had near syncopal episode while at [facility]  currently afebrile

## 2021-08-31 NOTE — ED Notes (Signed)
Sent msg to request cancel serial troponins as pt has had 2 unchanged results.

## 2021-08-31 NOTE — ED Provider Notes (Signed)
[name]    CSN: [id number] Arrival date & time: 08/31/21  7412      History   Chief Complaint Chief Complaint  Patient presents with   Sore Throat   Generalized Body Aches   Headache   Cough    HPI William Taylor is a 88 y.o. male.  Accompanied by his wife, patient presents with 2 to 3-day history of cough productive of gray sputum, body aches, headache, sore throat, congestion, runny nose.  His chest hurts when he coughs.  He also has nausea and decreased oral intake.  He had diarrhea last week but this has resolved.  No fever at home (temperature not taken at home) but he has had chills.  No rash, shortness of breath, vomiting, or other symptoms.  Treatment at home with Tylenol; last taken yesterday.  His medical history includes hypertension, MI, atrial fibrillation, cardiomyopathy, pulmonary embolism, CKD, prostate cancer, anemia, diverticulosis.  The history is provided by the patient, the spouse and medical records.   Past Medical History:  Diagnosis Date   Cancer [facility])    Prostate, followed by Dr. Jacqlyn Larsen   Colon polyp    Coronary artery disease    a. 2005 s/p PCI LAD ([facility]); b 2012 s/p PCI OM2; c.10/2013 Cath: LAD 78m ISR, 22m, D1 90, D2 70, OM2 80 w/ patent stent;  d. 06/2019 Low risk MV; e. 07/2020 MV: EF 77%, no ischemia/infarct.   Gall stones    Hyperlipidemia    Hypertension    MI (myocardial infarction) ([place])    2015   Orthostatic hypotension    a. prev on midodrine/florinef/northera.   PAF (paroxysmal atrial fibrillation) ([facility])    a. on xarelto and amio (CHA2DS2VASc = 4).   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   Osteoarthritis of knees, bilateral 08/31/2019   Depression 01/11/2019   Dizziness 12/15/2018   History of GI bleed    Malnutrition of moderate degree 09/21/2018   Orthostatic hypotension 06/06/2018   Chronic pain of left knee 06/05/2018   Chronic pain in testicle 06/05/2018   Chronic back pain 02/17/2018   Angiodysplasia of intestinal tract    Iron deficiency anemia    Benign neoplasm of ascending colon    Diverticulosis of large intestine without diverticulitis    Gastric polyp    Left rotator cuff tear arthropathy 09/11/2015   Fatigue 04/15/2014   Abdominal pain 01/18/2014   Prostate cancer ([place]) 03/15/2013   DOE (dyspnea on exertion) 11/23/2012   Insomnia 09/03/2012   Hypertension 07/23/2012   CKD (chronic kidney disease), stage IIIa 07/23/2011   History of pulmonary embolism 01/09/2011   Paroxysmal A-fib ([place]) 09/06/2010   Mixed hyperlipidemia 05/19/2009    Past Surgical History:  Procedure Laterality Date   CARDIAC CATHETERIZATION  10/2013   [facility]   CATARACT EXTRACTION  Oct. 3, 2012   right eye   colonoscopy     COLONOSCOPY W/ POLYPECTOMY  2015   Dr Rayann Heman   COLONOSCOPY WITH PROPOFOL N/A 08/15/2016   Procedure: COLONOSCOPY WITH PROPOFOL;  Surgeon: [name], MD;  Location: [facility] ENDOSCOPY;  Service: Endoscopy;  Laterality: N/A;   CORONARY ANGIOPLASTY  2009   2005; s/p stent   ENTEROSCOPY N/A 09/22/2018   Procedure: ENTEROSCOPY;  Surgeon: Lin Landsman, MD;  Location: [facility] ENDOSCOPY;  Service: Gastroenterology;  Laterality: N/A;   ESOPHAGOGASTRODUODENOSCOPY (EGD) WITH PROPOFOL N/A 08/15/2016   Procedure: ESOPHAGOGASTRODUODENOSCOPY (EGD) WITH PROPOFOL;  Surgeon: [name], MD;  Location: [facility] ENDOSCOPY;  Service: Endoscopy;  Laterality: N/A;   GIVENS CAPSULE STUDY N/A 09/26/2016   Procedure: GIVENS CAPSULE STUDY;  Surgeon: [name], MD;  Location: [facility] ENDOSCOPY;  Service: Endoscopy;  Laterality: N/A;   HERNIA REPAIR  2013    LUMBAR SPINE SURGERY     UPPER GI ENDOSCOPY  Sept 2015   Dr Rudean Hitt Medications    Prior to Admission medications   Medication Sig Start Date End Date Taking? Authorizing Provider  amiodarone (PACERONE) 100 MG tablet Take 1 tablet (100 mg total) by mouth daily. 04/11/21   [name], MD  azelastine (ASTELIN) 0.1 % nasal spray Place 2 sprays into both nostrils 2 (two) times daily. Use in each nostril as directed 08/30/20   [name], MD  fenofibrate micronized (ANTARA) 130 MG capsule TAKE 1 CAPSULE BY MOUTH  DAILY BEFORE BREAKFAST 05/04/21   [name], MD  finasteride (PROSCAR) 5 MG tablet Take 1 tablet (5 mg total) by mouth daily. 08/02/21   Stoioff, Ronda Fairly, MD  levobunolol (BETAGAN) 0.5 % ophthalmic solution Place 1 drop into the right eye 2 (two) times daily.    [provider]  lisinopril (ZESTRIL) 10 MG tablet Take 1 tablet (10 mg total) by mouth daily. 04/11/21   [name], MD  loratadine (CLARITIN) 10 MG tablet Take 1 tablet (10 mg total) by mouth daily. 08/30/20   [name], MD  mirtazapine (REMERON) 15 MG tablet TAKE ONE TABLET EACH NIGHT 04/30/21   [name], MD  Multiple Vitamin (MULTIVITAMIN PO) Take 1 tablet by mouth daily.    [provider]  nitroGLYCERIN (NITROSTAT) 0.4 MG SL tablet DISSOLVE UNDER THE TONGUE 1 TABLET EVERY 5 MINUTES AS NEEDED FOR CHEST PAIN 04/11/21   [name], MD  pantoprazole (PROTONIX) 40 MG tablet TAKE 1 TABLET BY MOUTH TWICE DAILY. 08/10/21   [name], MD  pravastatin (PRAVACHOL) 40 MG tablet Take 1 tablet (40 mg total) by mouth at bedtime. 04/11/21   [name], MD  Rivaroxaban (XARELTO) 15 MG TABS tablet Take 1 tablet (15 mg total) by mouth daily. 04/11/21   [name], MD    Family History Family History  Problem Relation Age of Onset   Stroke Mother    Colon cancer Father    Coronary artery disease Brother    Other Brother 42       lymes dz     Social History Social History   Tobacco Use   Smoking status: Never   Smokeless tobacco: Never  Vaping Use   Vaping Use: Never used  Substance Use Topics   Alcohol use: No   Drug use: No     Allergies   Bee venom, Sulfasalazine, Sulfa antibiotics, Warfarin, and Warfarin and related   Review of Systems Review of Systems  Constitutional:  Positive for activity change, appetite change, chills and fatigue. Negative for fever.  HENT:  Positive for congestion, rhinorrhea and sore throat. Negative for ear pain.   Respiratory:  Positive for cough. Negative for shortness of breath.   Cardiovascular:  Negative for chest pain and palpitations.  Gastrointestinal:  Positive for nausea. Negative for diarrhea and vomiting.  Skin:  Negative for rash.  All other systems reviewed and are negative.   Physical Exam Triage Vital Signs ED Triage Vitals  Enc Vitals Group     BP      Pulse      Resp      Temp      Temp src      SpO2      Weight      Height      Head Circumference      Peak Flow      Pain Score      Pain Loc      Pain Edu?      Excl. in [place]?    No data found.  Updated Vital Signs BP 135/83    Pulse 96    Temp 98 F (36.7 C) (Oral)    Resp 18    SpO2 95%   Visual Acuity Right Eye Distance:   Left Eye Distance:   Bilateral Distance:    Right Eye Near:   Left Eye Near:    Bilateral Near:     Physical Exam Vitals and nursing note reviewed.  Constitutional:      General: He is not in acute distress.    Appearance: He is well-developed. He is ill-appearing.  HENT:     Nose: Rhinorrhea present.     Mouth/Throat:     Mouth: Mucous membranes are moist.     Pharynx: Posterior oropharyngeal erythema present.  Cardiovascular:     Rate and Rhythm: Normal rate and regular rhythm.     Heart sounds: Normal heart sounds.  Pulmonary:     Effort: Pulmonary effort is normal. No respiratory distress.     Breath sounds: Normal breath sounds.  Abdominal:      General: Bowel sounds are normal.     Palpations: Abdomen is soft.     Tenderness: There is no abdominal tenderness. There is no guarding or rebound.  Musculoskeletal:     Cervical back: Neck supple.  Skin:    General: Skin is warm and dry.  Neurological:     General: No focal deficit present.     Mental Status: He is alert and oriented to person, place, and time.  Psychiatric:        Mood and Affect: Mood normal.        Behavior: Behavior normal.     UC Treatments / Results  Labs (all labs ordered are listed, but only abnormal results are displayed) Labs Reviewed  POCT INFLUENZA A/B    EKG   Radiology No results found.  Procedures Procedures (including critical care time)  Medications Ordered in UC Medications  ondansetron (ZOFRAN-ODT) disintegrating tablet 4 mg (4 mg Oral Given 08/31/21 0930)    Initial Impression / Assessment and Plan / UC Course  I have reviewed the triage vital signs and the nursing notes.  Pertinent labs & imaging results that were available during my care of the patient were reviewed by me and considered in my medical decision making (see chart for details).    Presyncopal episode, nausea, productive cough, malaise.  Patient became ill during exam while taking deep breaths.  "I think I am going to pass out."  Blood pressure dropped to systolic in 41D; Once patient lying down, blood pressure returned to normal.  O2 sat 93% on 2L O2.  Zofran given here prior to  presyncope.  Rapid flu negative.  Due to patient's age, medical history, current symptoms, and presyncopal episode, sending patient to the ED via EMS.  He and his wife are agreeable to this.     Final Clinical Impressions(s) / UC Diagnoses   Final diagnoses:  Pre-syncope  Nausea without vomiting  Productive cough  Malaise   Discharge Instructions   None    ED Prescriptions   None    PDMP not reviewed this encounter.   Sharion Balloon, NP 08/31/21 [phone]

## 2021-08-31 NOTE — ED Notes (Signed)
ED secretary calling transport to bring pt to room.

## 2021-08-31 NOTE — H&P (Signed)
History and Physical    TACOMA MERIDA PFX:[id number] DOB: Jan 28, 1930 DOA: 08/31/2021  Referring MD/NP/PA:   PCP: [name], MD   Patient coming from:  The patient is coming from home.  At baseline, pt is independent for most of ADL.        Chief Complaint: cough and SOB  HPI: William Taylor is a 88 y.o. male with medical history significant of PE and A fib on Xarelto, hypertension, hyperlipidemia, GERD, depression, CAD, myocardial infarction, gallstone, prostate cancer, CKD-3A, anemia, GI bleeding, orthostatic hypotension, cold agglutinin, who presents with cough, shortness breath.  Patient states that he has been sick for more than 3 days.  He has cough with greenish sputum production, shortness breath, sore throat, body aches, headache, runny nose.  No fever or chills. He states that he has mild chest discomfort intermittently, currently no active chest pain.  Patient had nausea and diarrhea at the beginning, which has resolved.  Currently no abdominal pain or diarrhea.  No symptoms of UTI.  Patient has poor appetite and decreased oral intake.  Patient states that he felt like he was going to pass out, but did not.  No fall or injury. He went to the urgent care and negative  flu test there.   His oxygen saturation 93-95% on room air, but desaturated to 83 on ambulation in ED.  ED Course: pt was found to have positive COVID PCR, WBC 6.8, troponin level 15, 14, renal function close to baseline, temperature normal, soft blood pressure 99/64, heart rate 96, RR 18.  Chest x-ray negative for infiltration.  Patient is admitted to [facility] bed as inpatient  Review of Systems:   General: no fevers, chills, no body weight gain, has poor appetite, has fatigue HEENT: no blurry vision, hearing changes or sore throat Respiratory: has dyspnea, coughing, no wheezing CV: no chest pain, no palpitations GI: no nausea, vomiting, abdominal pain, diarrhea, constipation GU: no dysuria, burning  on urination, increased urinary frequency, hematuria  Ext: no leg edema Neuro: no unilateral weakness, numbness, or tingling, no vision change or hearing loss Skin: no rash, no skin tear. MSK: No muscle spasm, no deformity, no limitation of range of movement in spin Heme: No easy bruising.  Travel history: No recent long distant travel.  Allergy:  Allergies  Allergen Reactions   Bee Venom Swelling   Sulfasalazine Hives and Swelling   Sulfa Antibiotics Hives and Swelling   Warfarin    Warfarin And Related Other (See Comments)    Chest pain    Past Medical History:  Diagnosis Date   Cancer [facility])    Prostate, followed by Dr. Jacqlyn Larsen   Colon polyp    Coronary artery disease    a. 2005 s/p PCI LAD ([facility]); b 2012 s/p PCI OM2; c.10/2013 Cath: LAD 70m ISR, 52m, D1 90, D2 70, OM2 80 w/ patent stent;  d. 06/2019 Low risk MV; e. 07/2020 MV: EF 77%, no ischemia/infarct.   Gall stones    Hyperlipidemia    Hypertension    MI (myocardial infarction) ([facility])    2015   Orthostatic hypotension    a. prev on midodrine/florinef/northera.   PAF (paroxysmal atrial fibrillation) (HCC)    a. on xarelto and amio (CHA2DS2VASc = 4).   Skin cancer     Past Surgical History:  Procedure Laterality Date   CARDIAC CATHETERIZATION  10/2013   [facility]   CATARACT EXTRACTION  Oct. 3, 2012   right eye   colonoscopy  COLONOSCOPY W/ POLYPECTOMY  2015   Dr Rayann Heman   COLONOSCOPY WITH PROPOFOL N/A 08/15/2016   Procedure: COLONOSCOPY WITH PROPOFOL;  Surgeon: [name], MD;  Location: [facility] ENDOSCOPY;  Service: Endoscopy;  Laterality: N/A;   CORONARY ANGIOPLASTY  2009   2005; s/p stent   ENTEROSCOPY N/A 09/22/2018   Procedure: ENTEROSCOPY;  Surgeon: Lin Landsman, MD;  Location: [facility] ENDOSCOPY;  Service: Gastroenterology;  Laterality: N/A;   ESOPHAGOGASTRODUODENOSCOPY (EGD) WITH PROPOFOL N/A 08/15/2016   Procedure: ESOPHAGOGASTRODUODENOSCOPY (EGD) WITH PROPOFOL;  Surgeon: [name], MD;  Location: [facility]  ENDOSCOPY;  Service: Endoscopy;  Laterality: N/A;   GIVENS CAPSULE STUDY N/A 09/26/2016   Procedure: GIVENS CAPSULE STUDY;  Surgeon: [name], MD;  Location: [facility] ENDOSCOPY;  Service: Endoscopy;  Laterality: N/A;   HERNIA REPAIR  2013   LUMBAR SPINE SURGERY     UPPER GI ENDOSCOPY  Sept 2015   Dr Rayann Heman    Social History:  reports that he has never smoked. He has never used smokeless tobacco. He reports that he does not drink alcohol and does not use drugs.  Family History:  Family History  Problem Relation Age of Onset   Stroke Mother    Colon cancer Father    Coronary artery disease Brother    Other Brother 56       lymes dz     Prior to Admission medications   Medication Sig Start Date End Date Taking? Authorizing Provider  amiodarone (PACERONE) 100 MG tablet Take 1 tablet (100 mg total) by mouth daily. 04/11/21   [name], MD  azelastine (ASTELIN) 0.1 % nasal spray Place 2 sprays into both nostrils 2 (two) times daily. Use in each nostril as directed 08/30/20   [name], MD  fenofibrate micronized (ANTARA) 130 MG capsule TAKE 1 CAPSULE BY MOUTH  DAILY BEFORE BREAKFAST 05/04/21   [name], MD  finasteride (PROSCAR) 5 MG tablet Take 1 tablet (5 mg total) by mouth daily. 08/02/21   Stoioff, Ronda Fairly, MD  levobunolol (BETAGAN) 0.5 % ophthalmic solution Place 1 drop into the right eye 2 (two) times daily.    [provider]  lisinopril (ZESTRIL) 10 MG tablet Take 1 tablet (10 mg total) by mouth daily. 04/11/21   [name], MD  loratadine (CLARITIN) 10 MG tablet Take 1 tablet (10 mg total) by mouth daily. 08/30/20   [name], MD  mirtazapine (REMERON) 15 MG tablet TAKE ONE TABLET EACH NIGHT 04/30/21   [name], MD  Multiple Vitamin (MULTIVITAMIN PO) Take 1 tablet by mouth daily.    [provider]  nitroGLYCERIN (NITROSTAT) 0.4 MG SL tablet DISSOLVE UNDER THE TONGUE 1 TABLET EVERY 5 MINUTES AS NEEDED FOR CHEST PAIN 04/11/21    [name], MD  pantoprazole (PROTONIX) 40 MG tablet TAKE 1 TABLET BY MOUTH TWICE DAILY. 08/10/21   [name], MD  pravastatin (PRAVACHOL) 40 MG tablet Take 1 tablet (40 mg total) by mouth at bedtime. 04/11/21   [name], MD  Rivaroxaban (XARELTO) 15 MG TABS tablet Take 1 tablet (15 mg total) by mouth daily. 04/11/21   [name], MD    Physical Exam: Vitals:   08/31/21 1005 08/31/21 1217 08/31/21 1630 08/31/21 1725  BP: 129/84 136/84 (!) 170/82 (!) 150/77  Pulse: 90 88 92 81  Resp: 16 16 16 16   Temp: 98.4 F (36.9 C)     TempSrc: Oral     SpO2: 95% 95% 100%  95%  Weight:      Height:       General: Not in acute distress HEENT:       Eyes: PERRL, EOMI, no scleral icterus.       ENT: No discharge from the ears and nose, no pharynx injection, no tonsillar enlargement.        Neck: No JVD, no bruit, no mass felt. Heme: No neck lymph node enlargement. Cardiac: S1/S2, RRR, No murmurs, No gallops or rubs. Respiratory: has coarse breathing sound bilaterally GI: Soft, nondistended, nontender, no rebound pain, no organomegaly, BS present. GU: No hematuria Ext: No pitting leg edema bilaterally. 1+DP/PT pulse bilaterally. Musculoskeletal: No joint deformities, No joint redness or warmth, no limitation of ROM in spin. Skin: No rashes.  Neuro: Alert, oriented X3, cranial nerves II-XII grossly intact, moves all extremities normally.  Psych: Patient is not psychotic, no suicidal or hemocidal ideation.  Labs on Admission: I have personally reviewed following labs and imaging studies  CBC: Recent Labs  Lab 08/31/21 1140  WBC 6.8  NEUTROABS 4.9  HGB 10.3*  HCT 31.1*  MCV 100.0  PLT 662   Basic Metabolic Panel: Recent Labs  Lab 08/31/21 1140  NA 137  K 3.9  CL 106  CO2 24  GLUCOSE 121*  BUN 25*  CREATININE 1.56*  CALCIUM 9.1   GFR: Estimated Creatinine Clearance: 39.4 mL/min (A) (by C-G formula based on SCr of 1.56 mg/dL (H)). Liver Function  Tests: Recent Labs  Lab 08/31/21 1140  AST 45*  ALT 16  ALKPHOS 32*  BILITOT 2.3*  PROT 6.8  ALBUMIN 3.9   No results for input(s): LIPASE, AMYLASE in the last 168 hours. No results for input(s): AMMONIA in the last 168 hours. Coagulation Profile: No results for input(s): INR, PROTIME in the last 168 hours. Cardiac Enzymes: No results for input(s): CKTOTAL, CKMB, CKMBINDEX, TROPONINI in the last 168 hours. BNP (last 3 results) No results for input(s): PROBNP in the last 8760 hours. HbA1C: No results for input(s): HGBA1C in the last 72 hours. CBG: No results for input(s): GLUCAP in the last 168 hours. Lipid Profile: No results for input(s): CHOL, HDL, LDLCALC, TRIG, CHOLHDL, LDLDIRECT in the last 72 hours. Thyroid Function Tests: No results for input(s): TSH, T4TOTAL, FREET4, T3FREE, THYROIDAB in the last 72 hours. Anemia Panel: No results for input(s): VITAMINB12, FOLATE, FERRITIN, TIBC, IRON, RETICCTPCT in the last 72 hours. Urine analysis:    Component Value Date/Time   COLORURINE YELLOW (A) 12/15/2018 1506   APPEARANCEUR CLEAR (A) 12/15/2018 1506   APPEARANCEUR Hazy 02/27/2014 1335   LABSPEC 1.010 12/15/2018 1506   LABSPEC 1.017 02/27/2014 1335   PHURINE 7.0 12/15/2018 1506   GLUCOSEU NEGATIVE 12/15/2018 1506   GLUCOSEU Negative 02/27/2014 1335   HGBUR NEGATIVE 12/15/2018 1506   BILIRUBINUR NEGATIVE 12/15/2018 1506   BILIRUBINUR neg 03/01/2014 1334   BILIRUBINUR Negative 02/27/2014 1335   KETONESUR NEGATIVE 12/15/2018 1506   PROTEINUR NEGATIVE 12/15/2018 1506   UROBILINOGEN 0.2 03/01/2014 1334   NITRITE NEGATIVE 12/15/2018 1506   LEUKOCYTESUR NEGATIVE 12/15/2018 1506   LEUKOCYTESUR Negative 02/27/2014 1335   Sepsis Labs: @LABRCNTIP (procalcitonin:4,lacticidven:4) ) Recent Results (from the past 240 hour(s))  Resp Panel by RT-PCR (Flu A&B, Covid) Nasopharyngeal Swab     Status: Abnormal   Collection Time: 08/31/21 10:03 AM   Specimen: Nasopharyngeal Swab;  Nasopharyngeal(NP) swabs in vial transport medium  Result Value Ref Range Status   SARS Coronavirus 2 by RT PCR POSITIVE (A) NEGATIVE Final  Comment: (NOTE) SARS-CoV-2 target nucleic acids are DETECTED.  The SARS-CoV-2 RNA is generally detectable in upper respiratory specimens during the acute phase of infection. Positive results are indicative of the presence of the identified virus, but do not rule out bacterial infection or co-infection with other pathogens not detected by the test. Clinical correlation with patient history and other diagnostic information is necessary to determine patient infection status. The expected result is Negative.  Fact Sheet for Patients: EntrepreneurPulse.com.au  Fact Sheet for Healthcare Providers: IncredibleEmployment.be  This test is not yet approved or cleared by the [place] FDA and  has been authorized for detection and/or diagnosis of SARS-CoV-2 by FDA under an Emergency Use Authorization (EUA).  This EUA will remain in effect (meaning this test can be used) for the duration of  the COVID-19 declaration under Section 564(b)(1) of the A ct, 21 U.S.C. section 360bbb-3(b)(1), unless the authorization is terminated or revoked sooner.     Influenza A by PCR NEGATIVE NEGATIVE Final   Influenza B by PCR NEGATIVE NEGATIVE Final    Comment: (NOTE) The Xpert Xpress SARS-CoV-2/FLU/RSV plus assay is intended as an aid in the diagnosis of influenza from Nasopharyngeal swab specimens and should not be used as a sole basis for treatment. Nasal washings and aspirates are unacceptable for Xpert Xpress SARS-CoV-2/FLU/RSV testing.  Fact Sheet for Patients: EntrepreneurPulse.com.au  Fact Sheet for Healthcare Providers: IncredibleEmployment.be  This test is not yet approved or cleared by the [place] FDA and has been authorized for detection and/or diagnosis of SARS-CoV-2  by FDA under an Emergency Use Authorization (EUA). This EUA will remain in effect (meaning this test can be used) for the duration of the COVID-19 declaration under Section 564(b)(1) of the Act, 21 U.S.C. section 360bbb-3(b)(1), unless the authorization is terminated or revoked.  Performed at [facility], [address] [place], [place] [address]      Radiological Exams on Admission: DG Chest 2 View  Result Date: 08/31/2021 CLINICAL DATA:  Shortness of breath EXAM: CHEST - 2 VIEW COMPARISON:  09/08/2020 FINDINGS: Lungs are hyperinflated as can be seen with COPD. No focal consolidation. No pleural effusion or pneumothorax. Stable cardiomegaly. No acute osseous abnormality. IMPRESSION: No active cardiopulmonary disease. Electronically Signed   By: Kathreen Devoid M.D.   On: 08/31/2021 11:29     EKG: I have personally reviewed.  Sinus rhythm, QTC 452, LAD, low voltage.  Assessment/Plan Principal Problem:   Acute respiratory disease due to COVID-19 virus Active Problems:   Paroxysmal A-fib (HCC)   History of pulmonary embolism   CKD (chronic kidney disease), stage IIIa   Hypertension   Depression   Coronary artery disease   Acute respiratory disease due to COVID-19 virus: Chest x-ray negative, but the patient has oxygen desaturation on ambulation.  -will admit to med-surg bed as inpt -Remdesivir per pharm -Solumedrol 40 mg bid -Bronchodilators -PRN Mucinex for cough -Gentle IV fluid: 500 cc NS -f/u inflammatory marker, CRP -Will ask the patient to maintain an awake prone position for 16+ hours a day, if possible, with a minimum of 2-3 hours at a time -Will attempt to maintain euvolemia to a net negative fluid status -IF patient deteriorates, will consult PCCM and ID  Paroxysmal A-fib (HCC) -Continue Xarelto, amiodarone  History of pulmonary embolism -Xarelto  CKD (chronic kidney disease), stage IIIa: Close to baseline.  Baseline creatinine 1.3-1.5.  His  creatinine is 1.56 and BUN 25 -Follow-up with BMP  Hypertension: Blood pressure is soft -Hold on  lisinopril due to softer blood pressure  Depression -Continue Remeron  HLD: -Fenofibrate and pravastatin  Coronary artery disease -Pravastatin -As needed nitroglycerin     DVT ppx: on Xarelto Code Status:  Partial code (I discussed with the patient in the presence of his wife, and explained the meaning of CODE STATUS, patient wants to be partial code, OK for CPR, but no intubation). Family Communication: Yes, patient's wife   at bed side.  Disposition Plan:  Anticipate discharge back to previous environment Consults called: none Admission status and Level of care: Telemetry Medical:   as inpt       Status is: Inpatient  Remains inpatient appropriate because: Patient has multiple comorbidities, now presents with acute respiratory disease due to COVID 19 infection.  Patient also has oxygen desaturation on ambulation.  Given his older age and multiple comorbidities, patient is at high risk of deteriorating.  Need to be treated in hospital for at least 2 days.          Date of Service 08/31/2021    [name] Costa [facility]   If 7PM-7AM, please contact night-coverage [email or web address] 08/31/2021, 5:52 PM

## 2021-09-01 ENCOUNTER — Encounter: Payer: Self-pay | Admitting: Internal Medicine

## 2021-09-01 DIAGNOSIS — U071 COVID-19: Secondary | ICD-10-CM | POA: Diagnosis not present

## 2021-09-01 DIAGNOSIS — J069 Acute upper respiratory infection, unspecified: Secondary | ICD-10-CM | POA: Diagnosis not present

## 2021-09-01 DIAGNOSIS — R55 Syncope and collapse: Secondary | ICD-10-CM | POA: Diagnosis present

## 2021-09-01 LAB — CBC WITH DIFFERENTIAL/PLATELET
Abs Immature Granulocytes: 0.02 10*3/uL (ref 0.00–0.07)
Basophils Absolute: 0 10*3/uL (ref 0.0–0.1)
Basophils Relative: 0 %
Eosinophils Absolute: 0 10*3/uL (ref 0.0–0.5)
Eosinophils Relative: 0 %
HCT: 29.4 % — ABNORMAL LOW (ref 39.0–52.0)
Hemoglobin: 9.9 g/dL — ABNORMAL LOW (ref 13.0–17.0)
Immature Granulocytes: 1 %
Lymphocytes Relative: 20 %
Lymphs Abs: 0.7 10*3/uL (ref 0.7–4.0)
MCH: 32.8 pg (ref 26.0–34.0)
MCHC: 33.7 g/dL (ref 30.0–36.0)
MCV: 97.4 fL (ref 80.0–100.0)
Monocytes Absolute: 0.2 10*3/uL (ref 0.1–1.0)
Monocytes Relative: 6 %
Neutro Abs: 2.5 10*3/uL (ref 1.7–7.7)
Neutrophils Relative %: 73 %
Platelets: 177 10*3/uL (ref 150–400)
RBC: 3.02 MIL/uL — ABNORMAL LOW (ref 4.22–5.81)
RDW: 15.6 % — ABNORMAL HIGH (ref 11.5–15.5)
WBC: 3.4 10*3/uL — ABNORMAL LOW (ref 4.0–10.5)
nRBC: 0 % (ref 0.0–0.2)

## 2021-09-01 LAB — COMPREHENSIVE METABOLIC PANEL
ALT: 15 U/L (ref 0–44)
AST: 39 U/L (ref 15–41)
Albumin: 3.5 g/dL (ref 3.5–5.0)
Alkaline Phosphatase: 28 U/L — ABNORMAL LOW (ref 38–126)
Anion gap: 7 (ref 5–15)
BUN: 24 mg/dL — ABNORMAL HIGH (ref 8–23)
CO2: 22 mmol/L (ref 22–32)
Calcium: 8.9 mg/dL (ref 8.9–10.3)
Chloride: 109 mmol/L (ref 98–111)
Creatinine, Ser: 1.2 mg/dL (ref 0.61–1.24)
GFR, Estimated: 58 mL/min — ABNORMAL LOW (ref >60)
Glucose, Bld: 158 mg/dL — ABNORMAL HIGH (ref 70–99)
Potassium: 4.3 mmol/L (ref 3.5–5.1)
Sodium: 138 mmol/L (ref 135–145)
Total Bilirubin: 1.3 mg/dL — ABNORMAL HIGH (ref 0.3–1.2)
Total Protein: 6.5 g/dL (ref 6.5–8.1)

## 2021-09-01 LAB — D-DIMER, QUANTITATIVE: D-Dimer, Quant: 0.38 ug/mL-FEU (ref 0.00–0.50)

## 2021-09-01 LAB — C-REACTIVE PROTEIN: CRP: 5.1 mg/dL — ABNORMAL HIGH (ref <1.0)

## 2021-09-01 MED ORDER — DM-GUAIFENESIN ER 30-600 MG PO TB12
1.0000 | ORAL_TABLET | Freq: Two times a day (BID) | ORAL | Status: DC
  Administered 2021-09-01 – 2021-09-04 (×7): 1 via ORAL
  Filled 2021-09-01 (×7): qty 1

## 2021-09-01 NOTE — Plan of Care (Signed)

## 2021-09-01 NOTE — Evaluation (Signed)
Physical Therapy Evaluation Patient Details Name: William Taylor MRN: [id number] DOB: [birth year] Today's Date: 09/01/2021  History of Present Illness  Pt is an 88 y/o M admitted on 08/31/21 with c/c of cough & SOB. Pt is being treated for acute respiratory disease 2/2 COVID 19 infection. Chest x-ray negative. PMH: PE & A-fib on xarelto, HTN, HLD, GERD, depression, CAD, MI, gallstone, prostate CA, CKD-3A, anemia, GI bleeding, orthostatic hypotension, cold agglutinin  Clinical Impression  Pt seen for PT evaluation with pt reporting he feels much better compared to admission. Pt is able to complete bed mobility, sit<>stand, & ambulate multiple laps in room without AD with mod I. Pt reports he was surprised to hear PT was coming by & does not feel a need for it as he is at his baseline level of function. Pt does demonstrate orthostatic hypotension from sit>stand but denies symptoms; pt reports he typically wears compression socks at home & nurse & MD made aware. At this time pt does not demonstrate any acute PT needs & PT to sign off - team aware. Pt encouraged to ambulate in room while in hospital.  BP checked in LUE: Supine: 127/81 mmHg (MAP 92) Sitting: 129/73 mmHg (MAP 91) Standing at 0: 98/66 mmHg (MAP 78) Standing at 3: 92/72 mmHg (MAP 81)        Recommendations for follow up therapy are one component of a multi-disciplinary discharge planning process, led by the attending physician.  Recommendations may be updated based on patient status, additional functional criteria and insurance authorization.  Follow Up Recommendations No PT follow up    Assistance Recommended at Discharge PRN  Patient can return home with the following       Equipment Recommendations None recommended by PT  Recommendations for Other Services       Functional Status Assessment Patient has not had a recent decline in their functional status     Precautions / Restrictions Precautions Precautions:  None Restrictions Weight Bearing Restrictions: No      Mobility  Bed Mobility Overal bed mobility: Modified Independent             General bed mobility comments: supine>sit with bed rails, HOB slightly elevated, a little extra time    Transfers Overall transfer level: Modified independent Equipment used: None               General transfer comment: sit<>stand without AD    Ambulation/Gait Ambulation/Gait assistance:  (CGA fade to mod I) Gait Distance (Feet): 80 Feet (multiple laps in room equating to ~80 ft) Assistive device: None Gait Pattern/deviations: Decreased step length - right;Decreased step length - left;Decreased stride length Gait velocity: slow, steady        Stairs            Wheelchair Mobility    Modified Rankin (Stroke Patients Only)       Balance Overall balance assessment: Mild deficits observed, not formally tested                                           Pertinent Vitals/Pain Pain Assessment: No/denies pain    Home Living Family/patient expects to be discharged to:: Private residence Living Arrangements: Spouse/significant other Available Help at Discharge: Family;Available 24 hours/day Type of Home: House Home Access: Level entry       Home Layout: One level Home Equipment: Cane - single point  Prior Function Prior Level of Function : Independent/Modified Independent             Mobility Comments: Pt denies hx of falling, still drives, reports PRN use of cane in the community, no AD use in the home.       Hand Dominance        Extremity/Trunk Assessment   Upper Extremity Assessment Upper Extremity Assessment: Overall WFL for tasks assessed    Lower Extremity Assessment Lower Extremity Assessment: Overall WFL for tasks assessed    Cervical / Trunk Assessment Cervical / Trunk Assessment: Kyphotic (forward head)  Communication   Communication: No difficulties  Cognition  Arousal/Alertness: Awake/alert Behavior During Therapy: WFL for tasks assessed/performed Overall Cognitive Status: Within Functional Limits for tasks assessed                                 General Comments: Pleasant gentleman.        General Comments General comments (skin integrity, edema, etc.): Pt on room air with SPO2 >90% throughout.    Exercises     Assessment/Plan    PT Assessment Patient does not need any further PT services  PT Problem List         PT Treatment Interventions      PT Goals (Current goals can be found in the Care Plan section)  Acute Rehab PT Goals Patient Stated Goal: go home PT Goal Formulation: With patient Time For Goal Achievement: 09/15/21 Potential to Achieve Goals: Good    Frequency       Co-evaluation               AM-PAC PT "6 Clicks" Mobility  Outcome Measure Help needed turning from your back to your side while in a flat bed without using bedrails?: None Help needed moving from lying on your back to sitting on the side of a flat bed without using bedrails?: None Help needed moving to and from a bed to a chair (including a wheelchair)?: None Help needed standing up from a chair using your arms (e.g., wheelchair or bedside chair)?: None Help needed to walk in hospital room?: None Help needed climbing 3-5 steps with a railing? : A Little 6 Click Score: 23    End of Session   Activity Tolerance: Patient tolerated treatment well Patient left: with call bell/phone within reach (seated EOB with family entering room) Nurse Communication: Mobility status (BP)      Time: 1400-1420 PT Time Calculation (min) (ACUTE ONLY): 20 min   Charges:   PT Evaluation $PT Eval Low Complexity: Strang, PT, DPT 09/01/21, 3:02 PM   Waunita Schooner 09/01/2021, 2:59 PM

## 2021-09-01 NOTE — Assessment & Plan Note (Addendum)
Lisinopril held on admission due to soft BP. Home regimen appears to be lisinopril 10 mg daily. Blood was labile earlier during admission.    Also has history of orthostatic hypotension. 1/16: BP better controlled with resuming lisinopril yesterday.  141/88 this AM.  BP is somewhat labile, and he still have positive orthostatics. --Continue home lisinopril -- Monitor BP closely

## 2021-09-01 NOTE — Assessment & Plan Note (Addendum)
Patient here syncopal episode when seen at urgent care, prompting referral to the ED.  He reports a long history of orthostatic hypotension and reported being dizzy and lightheaded, nauseous at urgent care.   Orthostatic vitals were positive on 1/14 the patient was reportedly asymptomatic. -- Fall precautions -- Orthostatic vitals daily -- Avoid hypotension

## 2021-09-01 NOTE — Assessment & Plan Note (Signed)
Heart rate is controlled.  Continue Xarelto and amiodarone.  Monitor on telemetry.

## 2021-09-01 NOTE — Assessment & Plan Note (Signed)
Stable, no active chest pain. Continue pravastatin As needed nitro

## 2021-09-01 NOTE — Assessment & Plan Note (Signed)
Continue pravastatin 

## 2021-09-01 NOTE — Assessment & Plan Note (Addendum)
Patient presented with cough, shortness of breath, generalized weakness, poor p.o. intake and a near syncopal episode when seen at urgent care.  Chest x-ray negative for pneumonia.  COVID-19 PCR in the ED positive.  Patient dropped oxygen saturation to 83% with ambulation in the ED. -- Continue remdesivir -- Initially on IV steroids -- Reduce prednisone 40>>20 mg and taper off quickly.  Hypoxia quickly resolved and pt on room air. -- Supportive care with bronchodilators, mucolytic's, pulmonary hygiene -- Follow inflammatory markers, CBC, CMP -- Monitor oxygenation and supplement O2 as needed to maintain sats above 90% -- Mobility as tolerated

## 2021-09-01 NOTE — Progress Notes (Addendum)
Progress Note   Patient: William Taylor GEX:[id number] DOB: 04/12/1930 DOA: 08/31/2021     1 DOS: the patient was seen and examined on 09/01/2021   Brief hospital course: 88 year old male with past medical history of PE and A. fib on Xarelto, hypertension, hyperlipidemia, GERD, depression, CAD with history of MI,, gallstones, prostate cancer, CKD stage IIIa, anemia, GI bleeding, orthostatic hypotension, cold agglutinin who presented to the ED on 08/31/2021 with complaints of cough, shortness of breath, generalized weakness, poor p.o. intake.  He was seen at urgent care and had a near syncopal episode there, referred to the ED.  When seen in the ED here, he was ambulated and dropped oxygen saturation to 83% on room air.  COVID-19 PCR returned positive.  Blood pressure was soft at 99/64 in the ED.  Chest x-ray negative for pneumonia.  Admitted to [facility] service and started on remdesivir and steroids.  Assessment and Plan * Acute respiratory disease due to COVID-19 virus- (present on admission) Patient presented with cough, shortness of breath, generalized weakness, poor p.o. intake and a near syncopal episode when seen at urgent care.  Chest x-ray negative for pneumonia.  COVID-19 PCR in the ED positive.  Patient dropped oxygen saturation to 83% with ambulation in the ED. -- Continue remdesivir and IV steroids -- Supportive care with bronchodilators, mucolytic's, pulmonary hygiene -- Follow inflammatory markers, CBC, CMP -- Monitor oxygenation and supplement O2 as needed to maintain sats above 90% -- Mobility as tolerated  Near syncope- (present on admission) Patient here syncopal episode when seen at urgent care, prompting referral to the ED.  He reports a long history of orthostatic hypotension and reported being dizzy and lightheaded, nauseous at urgent care.  Currently feeling better but has not ambulated since admission. -- Fall precautions -- Orthostatic vitals daily -- Avoid hypotension,  antihypertensives are held  Orthostatic hypotension- (present on admission) Likely etiology of patient's near syncopal episode at urgent care.  Antihypertensives are on hold as baseline blood pressure is soft.  Patient wife reported he has required midodrine in the past, does not currently appear to be on it. 1/14: positive orthostatic vitals with BP dropping from 126/76 >> 113/79 >> 85/66 standing -- Orthostatic vitals daily -- Fall precautions -- Add midodrine if needed -- PT evaluation -- TED hose  CKD (chronic kidney disease), stage IIIa- (present on admission) Renal function close to baseline.  Baseline creatinine around 1.3-1.5.  Creatinine on admission 1.56. --Monitor BMP  History of pulmonary embolism- (present on admission) Continue Xarelto  Paroxysmal A-fib ([place])- (present on admission) Heart rate is controlled.  Continue Xarelto and amiodarone.  Monitor on telemetry.  Iron deficiency anemia- (present on admission) Hemoglobin stable on admission 10.3, this morning 9.9.  Monitor CBC.  Hypertension- (present on admission) Lisinopril held on admission due to soft BP. Blood pressure today is labile.  This morning 153/91, this afternoon 85/66.  Does have history of orthostatic hypotension. -- Continue holding antihypertensives  GERD (gastroesophageal reflux disease)- (present on admission) Continue PPI  Coronary artery disease- (present on admission) Stable, no active chest pain. Continue pravastatin As needed nitro  Depression- (present on admission) Continue Remeron  Mixed hyperlipidemia- (present on admission) Continue pravastatin  History of GI bleed No evidence of acute bleeding at this time.   On Xarelto for history of PE and A. Fib. Monitor.     Subjective: Patient awake sitting up in bed when seen today.  Wife at bedside.  He recalls is near syncopal episode at urgent  care and reports becoming dizzy, lightheaded and nauseated.  Similar to prior episodes  of orthostatic hypotension reports history of long hospital admission struggling with this problem.  Today, he reports feeling better, actually ate breakfast after having very poor appetite recently.  Does have sore throat and mostly dry cough.  Otherwise no acute complaints at this time.  Objective Vitals reviewed and notable for labile blood pressure this morning 153/91, this afternoon 85/66 with antihypertensives held.  No hypoxia charted since admission.  Heart rate controlled.  No fevers.  General exam: awake, alert, no acute distress HEENT: atraumatic, clear conjunctiva, anicteric sclera, moist mucus membranes, hearing grossly normal  Respiratory system: Lungs are diminished but overall clear, inspiration during exam causes coughing fit, cough sounds dry, no no wheezes, no rhonchi, normal respiratory effort at rest Cardiovascular system: normal S1/S2, RRR, no JVD, murmurs, rubs, gallops, no pedal edema.   Gastrointestinal system: soft, NT, ND, no HSM felt, +bowel sounds. Central nervous system: A&O x4. no gross focal neurologic deficits, normal speech Extremities: No cyanosis, moves all extremities, normal tone Skin: dry, intact, normal temperature Psychiatry: normal mood, congruent affect, judgement and insight appear normal   Data Reviewed:  Labs reviewed and notable for glucose 158, BUN 24, creatinine improved from 1.56-1.20, alk phos 28 (low, T bili improved from 2.3-1.3, CRP 5.1, hemoglobin stable 9.9  Family Communication: Wife at bedside on rounds today  Disposition: Status is: Inpatient  Remains inpatient appropriate because: Severity of illness with COVID-19 infection and is on IV therapies as outlined above         Time spent: 35 minutes  Author: Ezekiel Slocumb, DO 09/01/2021 3:06 PM  For on call review [email or web address].

## 2021-09-01 NOTE — Assessment & Plan Note (Addendum)
Likely etiology of patient's near syncopal episode at urgent care.  Antihypertensives are on hold as baseline blood pressure is soft.  Patient wife reported he has required midodrine in the past, does not currently appear to be on it. 1/14: positive orthostatic vitals with BP dropping from 126/76 >> 113/79 >> 85/66 standing.   Patient was reportedly asymptomatic. -- Orthostatic vitals daily -- Fall precautions -- Add midodrine if needed -- PT evaluation -- TED hose

## 2021-09-01 NOTE — Assessment & Plan Note (Signed)
Continue PPI ?

## 2021-09-01 NOTE — Assessment & Plan Note (Signed)
No evidence of acute bleeding at this time.   On Xarelto for history of PE and A. Fib. Monitor.

## 2021-09-01 NOTE — Assessment & Plan Note (Signed)
Continue Xarelto 

## 2021-09-01 NOTE — Assessment & Plan Note (Addendum)
Hemoglobin stable on admission 10.3. This morning Hbg 9.8.   Monitor CBC.

## 2021-09-01 NOTE — Hospital Course (Signed)
88 year old male with past medical history of PE and A. fib on Xarelto, hypertension, hyperlipidemia, GERD, depression, CAD with history of MI,, gallstones, prostate cancer, CKD stage IIIa, anemia, GI bleeding, orthostatic hypotension, cold agglutinin who presented to the ED on 08/31/2021 with complaints of cough, shortness of breath, generalized weakness, poor p.o. intake.  He was seen at urgent care and had a near syncopal episode there, referred to the ED.  When seen in the ED here, he was ambulated and dropped oxygen saturation to 83% on room air.  COVID-19 PCR returned positive.  Blood pressure was soft at 99/64 in the ED.  Chest x-ray negative for pneumonia.  Admitted to [facility] service and started on remdesivir and steroids.

## 2021-09-01 NOTE — Assessment & Plan Note (Signed)
Continue Remeron 

## 2021-09-01 NOTE — Assessment & Plan Note (Addendum)
Renal function close to baseline.  Baseline creatinine around 1.3-1.5.   Creatinine on admission 1.56.   Creatinine today 1.30. --Monitor BMP

## 2021-09-02 DIAGNOSIS — J069 Acute upper respiratory infection, unspecified: Secondary | ICD-10-CM | POA: Diagnosis not present

## 2021-09-02 DIAGNOSIS — U071 COVID-19: Secondary | ICD-10-CM | POA: Diagnosis not present

## 2021-09-02 LAB — CBC WITH DIFFERENTIAL/PLATELET
Abs Immature Granulocytes: 0.03 10*3/uL (ref 0.00–0.07)
Basophils Absolute: 0 10*3/uL (ref 0.0–0.1)
Basophils Relative: 0 %
Eosinophils Absolute: 0 10*3/uL (ref 0.0–0.5)
Eosinophils Relative: 0 %
HCT: 29.5 % — ABNORMAL LOW (ref 39.0–52.0)
Hemoglobin: 9.7 g/dL — ABNORMAL LOW (ref 13.0–17.0)
Immature Granulocytes: 0 %
Lymphocytes Relative: 11 %
Lymphs Abs: 1 10*3/uL (ref 0.7–4.0)
MCH: 32.7 pg (ref 26.0–34.0)
MCHC: 32.9 g/dL (ref 30.0–36.0)
MCV: 99.3 fL (ref 80.0–100.0)
Monocytes Absolute: 0.6 10*3/uL (ref 0.1–1.0)
Monocytes Relative: 8 %
Neutro Abs: 6.9 10*3/uL (ref 1.7–7.7)
Neutrophils Relative %: 81 %
Platelets: 195 10*3/uL (ref 150–400)
RBC: 2.97 MIL/uL — ABNORMAL LOW (ref 4.22–5.81)
RDW: 15.4 % (ref 11.5–15.5)
WBC: 8.5 10*3/uL (ref 4.0–10.5)
nRBC: 0 % (ref 0.0–0.2)

## 2021-09-02 LAB — COMPREHENSIVE METABOLIC PANEL
ALT: 14 U/L (ref 0–44)
AST: 37 U/L (ref 15–41)
Albumin: 3.5 g/dL (ref 3.5–5.0)
Alkaline Phosphatase: 29 U/L — ABNORMAL LOW (ref 38–126)
Anion gap: 7 (ref 5–15)
BUN: 32 mg/dL — ABNORMAL HIGH (ref 8–23)
CO2: 22 mmol/L (ref 22–32)
Calcium: 9.1 mg/dL (ref 8.9–10.3)
Chloride: 108 mmol/L (ref 98–111)
Creatinine, Ser: 1.28 mg/dL — ABNORMAL HIGH (ref 0.61–1.24)
GFR, Estimated: 53 mL/min — ABNORMAL LOW (ref >60)
Glucose, Bld: 159 mg/dL — ABNORMAL HIGH (ref 70–99)
Potassium: 4.2 mmol/L (ref 3.5–5.1)
Sodium: 137 mmol/L (ref 135–145)
Total Bilirubin: 1.2 mg/dL (ref 0.3–1.2)
Total Protein: 6.2 g/dL — ABNORMAL LOW (ref 6.5–8.1)

## 2021-09-02 LAB — MAGNESIUM: Magnesium: 1.9 mg/dL (ref 1.7–2.4)

## 2021-09-02 LAB — PHOSPHORUS: Phosphorus: 2.1 mg/dL — ABNORMAL LOW (ref 2.5–4.6)

## 2021-09-02 LAB — C-REACTIVE PROTEIN: CRP: 2.4 mg/dL — ABNORMAL HIGH (ref <1.0)

## 2021-09-02 MED ORDER — POTASSIUM & SODIUM PHOSPHATES 280-160-250 MG PO PACK
1.0000 | PACK | Freq: Three times a day (TID) | ORAL | Status: AC
  Administered 2021-09-02 – 2021-09-03 (×7): 1 via ORAL
  Filled 2021-09-02 (×8): qty 1

## 2021-09-02 MED ORDER — LISINOPRIL 10 MG PO TABS
10.0000 mg | ORAL_TABLET | Freq: Every day | ORAL | Status: DC
  Administered 2021-09-02 – 2021-09-04 (×3): 10 mg via ORAL
  Filled 2021-09-02 (×3): qty 1

## 2021-09-02 MED ORDER — PREDNISONE 20 MG PO TABS: 40.0000 mg | ORAL_TABLET | Freq: Every day | ORAL | Status: DC

## 2021-09-02 NOTE — Assessment & Plan Note (Addendum)
Phosphorus low at 2.1>>2.3 today.  Started replacement yesterday. Replacing further with oral Phos-NAK today. -- Monitor Phos level, replace as needed

## 2021-09-02 NOTE — Progress Notes (Signed)
Progress Note   Patient: William Taylor [id number]:[id number] DOB: 1930-03-09 DOA: 08/31/2021     2 DOS: the patient was seen and examined on 09/02/2021   Brief hospital course: 88 year old male with past medical history of PE and A. fib on Xarelto, hypertension, hyperlipidemia, GERD, depression, CAD with history of MI,, gallstones, prostate cancer, CKD stage IIIa, anemia, GI bleeding, orthostatic hypotension, cold agglutinin who presented to the ED on 08/31/2021 with complaints of cough, shortness of breath, generalized weakness, poor p.o. intake.  He was seen at urgent care and had a near syncopal episode there, referred to the ED.  When seen in the ED here, he was ambulated and dropped oxygen saturation to 83% on room air.  COVID-19 PCR returned positive.  Blood pressure was soft at 99/64 in the ED.  Chest x-ray negative for pneumonia.  Admitted to [facility] service and started on remdesivir and steroids.  Assessment and Plan * Acute respiratory disease due to COVID-19 virus- (present on admission) Patient presented with cough, shortness of breath, generalized weakness, poor p.o. intake and a near syncopal episode when seen at urgent care.  Chest x-ray negative for pneumonia.  COVID-19 PCR in the ED positive.  Patient dropped oxygen saturation to 83% with ambulation in the ED. -- Continue remdesivir -- Initially on IV steroids -- On prednisone 40 mg daily, continue -- Supportive care with bronchodilators, mucolytic's, pulmonary hygiene -- Follow inflammatory markers, CBC, CMP -- Monitor oxygenation and supplement O2 as needed to maintain sats above 90% -- Mobility as tolerated  Near syncope- (present on admission) Patient here syncopal episode when seen at urgent care, prompting referral to the ED.  He reports a long history of orthostatic hypotension and reported being dizzy and lightheaded, nauseous at urgent care.   Orthostatic vitals were positive on 1/14 the patient was reportedly asymptomatic. --  Fall precautions -- Orthostatic vitals daily -- Avoid hypotension, antihypertensives are held  Orthostatic hypotension- (present on admission) Likely etiology of patient's near syncopal episode at urgent care.  Antihypertensives are on hold as baseline blood pressure is soft.  Patient wife reported he has required midodrine in the past, does not currently appear to be on it. 1/14: positive orthostatic vitals with BP dropping from 126/76 >> 113/79 >> 85/66 standing.  Patient was reportedly asymptomatic -- Orthostatic vitals daily -- Fall precautions -- Add midodrine if needed -- PT evaluation -- TED hose  CKD (chronic kidney disease), stage IIIa- (present on admission) Renal function close to baseline.  Baseline creatinine around 1.3-1.5.   Creatinine on admission 1.56.   Creatinine today 1.26. --Monitor BMP  History of pulmonary embolism- (present on admission) Continue Xarelto  Paroxysmal A-fib ([place])- (present on admission) Heart rate is controlled.  Continue Xarelto and amiodarone.  Monitor on telemetry.  Iron deficiency anemia- (present on admission) Hemoglobin stable on admission 10.3. This morning Hbg 9.7.   Monitor CBC.  Hypertension- (present on admission) Lisinopril held on admission due to soft BP. Home regimen appears to be lisinopril 10 mg daily. Blood was labile earlier during admission.    Also has history of orthostatic hypotension. 1/15: BP elevated with systolic this morning [id number] 68 and diastolic in the 97K --Resume home lisinopril -- Monitor BP closely  GERD (gastroesophageal reflux disease)- (present on admission) Continue PPI  Coronary artery disease- (present on admission) Stable, no active chest pain. Continue pravastatin As needed nitro  Depression- (present on admission) Continue Remeron  Mixed hyperlipidemia- (present on admission) Continue pravastatin  History of GI bleed  No evidence of acute bleeding at this time.   On Xarelto for  history of PE and A. Fib. Monitor.  Hypophosphatemia Phosphorus this morning was low at 2.1. Replacing with oral Phos-NAK. -- Monitor Phos level, replace as needed      Subjective: Patient awake sitting up in bed when seen today.  He reports wife being seen at a clinic today and may also have [place].  He reports overall feeling better.  Denies lightheaded or dizziness with orthostatic vitals taken yesterday when his blood pressure did drop significantly.  Patient offers no acute complaints.  Objective Vitals reviewed and notable for uncontrolled BP this morning 168/91, 151/97.  O2 sat stable on room air.  No fevers.  Heart rate is controlled.  General exam: awake, alert, no acute distress HEENT: atraumatic, clear conjunctiva, anicteric sclera, moist mucus membranes, hearing grossly normal  Respiratory system: CTAB, no wheezes, rales or rhonchi, normal respiratory effort at rest, on room air. Cardiovascular system: normal S1/S2,  RRR, no JVD, murmurs, rubs, gallops, no pedal edema.   Gastrointestinal system: soft, NT, ND, no HSM felt, +bowel sounds. Central nervous system: A&O x3. no gross focal neurologic deficits, normal speech Extremities: moves all, no edema, normal tone Skin: dry, intact, normal temperature, no rashes, lesions or ulcers seen on visualized skin Psychiatry: normal mood, congruent affect, judgement and insight appear normal   Data Reviewed:  Labs reviewed and notable for glucose 139, BUN 32, creatinine 1.28, phosphorus low at 2.1, magnesium 1.9, alk phos low 29, CRP improved to 2.4, hemoglobin stable 9.7  Family Communication: None at bedside during encounter, patient stated he would update wife when she calls and with results of her COVID test.  Disposition: Status is: Inpatient  Remains inpatient appropriate because: Severity of illness on IV therapies for COVID-19 infection.  Anticipate discharge on Tuesday 1/17 if clinically doing well         Time  spent: 35 minutes  Author: Ezekiel Slocumb, DO 09/02/2021 1:55 PM  For on call review [email or web address].

## 2021-09-02 NOTE — TOC CM/SW Note (Signed)
Patient's readmission risk score increased. Patient is on airborne isolation. Attempted call to wife Ivin Booty for high risk assessment. No answer. Left VM requesting a return call.  Oleh Genin, [place]

## 2021-09-03 DIAGNOSIS — J069 Acute upper respiratory infection, unspecified: Secondary | ICD-10-CM | POA: Diagnosis not present

## 2021-09-03 DIAGNOSIS — U071 COVID-19: Secondary | ICD-10-CM | POA: Diagnosis not present

## 2021-09-03 LAB — COMPREHENSIVE METABOLIC PANEL
ALT: 14 U/L (ref 0–44)
AST: 28 U/L (ref 15–41)
Albumin: 3.3 g/dL — ABNORMAL LOW (ref 3.5–5.0)
Alkaline Phosphatase: 28 U/L — ABNORMAL LOW (ref 38–126)
Anion gap: 5 (ref 5–15)
BUN: 32 mg/dL — ABNORMAL HIGH (ref 8–23)
CO2: 28 mmol/L (ref 22–32)
Calcium: 9.1 mg/dL (ref 8.9–10.3)
Chloride: 104 mmol/L (ref 98–111)
Creatinine, Ser: 1.3 mg/dL — ABNORMAL HIGH (ref 0.61–1.24)
GFR, Estimated: 53 mL/min — ABNORMAL LOW (ref >60)
Glucose, Bld: 105 mg/dL — ABNORMAL HIGH (ref 70–99)
Potassium: 4.3 mmol/L (ref 3.5–5.1)
Sodium: 137 mmol/L (ref 135–145)
Total Bilirubin: 0.9 mg/dL (ref 0.3–1.2)
Total Protein: 6 g/dL — ABNORMAL LOW (ref 6.5–8.1)

## 2021-09-03 LAB — CBC WITH DIFFERENTIAL/PLATELET
Abs Immature Granulocytes: 0.05 10*3/uL (ref 0.00–0.07)
Basophils Absolute: 0 10*3/uL (ref 0.0–0.1)
Basophils Relative: 0 %
Eosinophils Absolute: 0 10*3/uL (ref 0.0–0.5)
Eosinophils Relative: 0 %
HCT: 27.9 % — ABNORMAL LOW (ref 39.0–52.0)
Hemoglobin: 9.8 g/dL — ABNORMAL LOW (ref 13.0–17.0)
Immature Granulocytes: 1 %
Lymphocytes Relative: 31 %
Lymphs Abs: 2.8 10*3/uL (ref 0.7–4.0)
MCH: 34.8 pg — ABNORMAL HIGH (ref 26.0–34.0)
MCHC: 35.1 g/dL (ref 30.0–36.0)
MCV: 98.9 fL (ref 80.0–100.0)
Monocytes Absolute: 0.8 10*3/uL (ref 0.1–1.0)
Monocytes Relative: 9 %
Neutro Abs: 5.4 10*3/uL (ref 1.7–7.7)
Neutrophils Relative %: 59 %
Platelets: 203 10*3/uL (ref 150–400)
RBC: 2.82 MIL/uL — ABNORMAL LOW (ref 4.22–5.81)
RDW: 15 % (ref 11.5–15.5)
WBC: 9.1 10*3/uL (ref 4.0–10.5)
nRBC: 0 % (ref 0.0–0.2)

## 2021-09-03 LAB — URINALYSIS, ROUTINE W REFLEX MICROSCOPIC
Bilirubin Urine: NEGATIVE
Glucose, UA: NEGATIVE mg/dL
Hgb urine dipstick: NEGATIVE
Ketones, ur: NEGATIVE mg/dL
Leukocytes,Ua: NEGATIVE
Nitrite: NEGATIVE
Protein, ur: NEGATIVE mg/dL
Specific Gravity, Urine: 1.015 (ref 1.005–1.030)
pH: 5 (ref 5.0–8.0)

## 2021-09-03 LAB — C-REACTIVE PROTEIN: CRP: 0.9 mg/dL (ref <1.0)

## 2021-09-03 LAB — MAGNESIUM: Magnesium: 1.8 mg/dL (ref 1.7–2.4)

## 2021-09-03 LAB — PHOSPHORUS: Phosphorus: 2.3 mg/dL — ABNORMAL LOW (ref 2.5–4.6)

## 2021-09-03 MED ORDER — PREDNISONE 20 MG PO TABS
20.0000 mg | ORAL_TABLET | Freq: Every day | ORAL | Status: DC
Start: 2021-09-03 — End: 2021-09-04
  Administered 2021-09-03 – 2021-09-04 (×2): 20 mg via ORAL
  Filled 2021-09-03 (×2): qty 1

## 2021-09-03 NOTE — Progress Notes (Signed)
Progress Note   Patient: William Taylor [id number]:[id number] DOB: [birth year] DOA: 08/31/2021     3 DOS: the patient was seen and examined on 09/03/2021   Brief hospital course: 88 year old male with past medical history of PE and A. fib on Xarelto, hypertension, hyperlipidemia, GERD, depression, CAD with history of MI,, gallstones, prostate cancer, CKD stage IIIa, anemia, GI bleeding, orthostatic hypotension, cold agglutinin who presented to the ED on 08/31/2021 with complaints of cough, shortness of breath, generalized weakness, poor p.o. intake.  He was seen at urgent care and had a near syncopal episode there, referred to the ED.  When seen in the ED here, he was ambulated and dropped oxygen saturation to 83% on room air.  COVID-19 PCR returned positive.  Blood pressure was soft at 99/64 in the ED.  Chest x-ray negative for pneumonia.  Admitted to [facility] service and started on remdesivir and steroids.  Assessment and Plan * Acute respiratory disease due to COVID-19 virus- (present on admission) Patient presented with cough, shortness of breath, generalized weakness, poor p.o. intake and a near syncopal episode when seen at urgent care.  Chest x-ray negative for pneumonia.  COVID-19 PCR in the ED positive.  Patient dropped oxygen saturation to 83% with ambulation in the ED. -- Continue remdesivir -- Initially on IV steroids -- Reduce prednisone 40>>20 mg and taper off quickly.  Hypoxia quickly resolved and pt on room air. -- Supportive care with bronchodilators, mucolytic's, pulmonary hygiene -- Follow inflammatory markers, CBC, CMP -- Monitor oxygenation and supplement O2 as needed to maintain sats above 90% -- Mobility as tolerated  Near syncope- (present on admission) Patient here syncopal episode when seen at urgent care, prompting referral to the ED.  He reports a long history of orthostatic hypotension and reported being dizzy and lightheaded, nauseous at urgent care.   Orthostatic vitals were  positive on 1/14 the patient was reportedly asymptomatic. -- Fall precautions -- Orthostatic vitals daily -- Avoid hypotension  Orthostatic hypotension- (present on admission) Likely etiology of patient's near syncopal episode at urgent care.  Antihypertensives are on hold as baseline blood pressure is soft.  Patient wife reported he has required midodrine in the past, does not currently appear to be on it. 1/14: positive orthostatic vitals with BP dropping from 126/76 >> 113/79 >> 85/66 standing.   Patient was reportedly asymptomatic. -- Orthostatic vitals daily -- Fall precautions -- Add midodrine if needed -- PT evaluation -- TED hose  CKD (chronic kidney disease), stage IIIa- (present on admission) Renal function close to baseline.  Baseline creatinine around 1.3-1.5.   Creatinine on admission 1.56.   Creatinine today 1.30. --Monitor BMP  History of pulmonary embolism- (present on admission) Continue Xarelto  Paroxysmal A-fib ([place])- (present on admission) Heart rate is controlled.  Continue Xarelto and amiodarone.  Monitor on telemetry.  Iron deficiency anemia- (present on admission) Hemoglobin stable on admission 10.3. This morning Hbg 9.8.   Monitor CBC.  Hypertension- (present on admission) Lisinopril held on admission due to soft BP. Home regimen appears to be lisinopril 10 mg daily. Blood was labile earlier during admission.    Also has history of orthostatic hypotension. 1/16: BP better controlled with resuming lisinopril yesterday.  141/88 this AM.  BP is somewhat labile, and he still have positive orthostatics. --Continue home lisinopril -- Monitor BP closely  GERD (gastroesophageal reflux disease)- (present on admission) Continue PPI  Coronary artery disease- (present on admission) Stable, no active chest pain. Continue pravastatin As needed nitro  Depression- (  present on admission) Continue Remeron  Mixed hyperlipidemia- (present on  admission) Continue pravastatin  History of GI bleed No evidence of acute bleeding at this time.   On Xarelto for history of PE and A. Fib. Monitor.  Hypophosphatemia Phosphorus low at 2.1>>2.3 today.  Started replacement yesterday. Replacing further with oral Phos-NAK today. -- Monitor Phos level, replace as needed      Subjective: Patient awake resting in bed when seen today.  He reports sleeping okay last night.  He did have positive orthostatic vitals again yesterday but denies dizziness lightheadedness or other symptoms at the time.  He is anxious to go home, his wife is now also sick with COVID. Hopes to discharge tomorrow after last dose of remdesivir.  Objective Vitals reviewed and notable for overall better controlled blood pressure with resuming lisinopril, 141/88 this morning.  No fevers or hypoxia.  Orthostatic vitals yesterday were still positive with a drop from 157/87 lying down to 109/71 standing.  General exam: awake, alert, no acute distress HEENT: atraumatic, clear conjunctiva, anicteric sclera, moist mucus membranes, hearing grossly normal  Respiratory system: CTAB, no wheezes, rales or rhonchi, normal respiratory effort. Cardiovascular system: normal S1/S2, RRR, no pedal edema.   Central nervous system: A&O x3. no gross focal neurologic deficits, normal speech Extremities: moves all, no edema, normal tone Skin: dry, intact, normal temperature Psychiatry: normal mood, congruent affect, judgement and insight appear normal   Data Reviewed:  Is reviewed and notable for glucose 105, BUN 32, creatinine 1.30, phosphorus low 2.3, alk phos low at 28, albumin 3.3, CRP normalized at 0.9, hemoglobin stable 9.8, urinalysis early this morning normal  Family Communication: None at bedside during encounter.  Patient able to update.  Disposition: Status is: Inpatient  Remains inpatient appropriate because: Severity of illness and continuing on IV therapies as outlined  above.  Anticipate discharge home tomorrow after final dose of remdesivir if otherwise medically stable.         Time spent: 35 minutes  Author: Ezekiel Slocumb, DO 09/03/2021 3:07 PM  For on call review [email or web address].

## 2021-09-04 LAB — BASIC METABOLIC PANEL
Anion gap: 8 (ref 5–15)
BUN: 29 mg/dL — ABNORMAL HIGH (ref 8–23)
CO2: 28 mmol/L (ref 22–32)
Calcium: 9 mg/dL (ref 8.9–10.3)
Chloride: 100 mmol/L (ref 98–111)
Creatinine, Ser: 1.37 mg/dL — ABNORMAL HIGH (ref 0.61–1.24)
GFR, Estimated: 49 mL/min — ABNORMAL LOW (ref >60)
Glucose, Bld: 115 mg/dL — ABNORMAL HIGH (ref 70–99)
Potassium: 4.7 mmol/L (ref 3.5–5.1)
Sodium: 136 mmol/L (ref 135–145)

## 2021-09-04 LAB — MAGNESIUM: Magnesium: 1.7 mg/dL (ref 1.7–2.4)

## 2021-09-04 LAB — PHOSPHORUS: Phosphorus: 3.2 mg/dL (ref 2.5–4.6)

## 2021-09-04 NOTE — Discharge Summary (Signed)
Physician Discharge Summary   Patient: William Taylor MRN: [id number] DOB: 1930/03/20  Admit date:     08/31/2021  Discharge date: 09/23/21  Discharge Physician: Ezekiel Slocumb   PCP: [name], MD   Recommendations at discharge:    Follow up with PCP in 1-2 weeks CBC/CMP in 1-2 weeks  Discharge Diagnoses Principal Problem:   Acute respiratory disease due to COVID-19 virus Active Problems:   Near syncope   Orthostatic hypotension   CKD (chronic kidney disease), stage IIIa   Paroxysmal A-fib (HCC)   History of pulmonary embolism   Hypertension   Iron deficiency anemia   Coronary artery disease   GERD (gastroesophageal reflux disease)   Mixed hyperlipidemia   Depression   History of GI bleed   Hypophosphatemia  \  Hospital Course   88 year old male with past medical history of PE and A. fib on Xarelto, hypertension, hyperlipidemia, GERD, depression, CAD with history of MI,, gallstones, prostate cancer, CKD stage IIIa, anemia, GI bleeding, orthostatic hypotension, cold agglutinin who presented to the ED on 08/31/2021 with complaints of cough, shortness of breath, generalized weakness, poor p.o. intake.  He was seen at urgent care and had a near syncopal episode there, referred to the ED.  When seen in the ED here, he was ambulated and dropped oxygen saturation to 83% on room air.  COVID-19 PCR returned positive.  Blood pressure was soft at 99/64 in the ED.  Chest x-ray negative for pneumonia.  Admitted to [facility] service and started on remdesivir and steroids.  * Acute respiratory disease due to COVID-19 virus- (present on admission) Patient presented with cough, shortness of breath, generalized weakness, poor p.o. intake and a near syncopal episode when seen at urgent care.  Chest x-ray negative for pneumonia.  COVID-19 PCR in the ED positive.  Patient dropped oxygen saturation to 83% with ambulation in the ED. -- Continue remdesivir -- Initially on IV steroids -- Reduce  prednisone 40>>20 mg and taper off quickly.  Hypoxia quickly resolved and pt on room air. -- Supportive care with bronchodilators, mucolytic's, pulmonary hygiene -- Follow inflammatory markers, CBC, CMP -- Monitor oxygenation and supplement O2 as needed to maintain sats above 90% -- Mobility as tolerated  Near syncope- (present on admission) Patient here syncopal episode when seen at urgent care, prompting referral to the ED.  He reports a long history of orthostatic hypotension and reported being dizzy and lightheaded, nauseous at urgent care.   Orthostatic vitals were positive on 1/14 the patient was reportedly asymptomatic. -- Fall precautions -- Orthostatic vitals daily -- Avoid hypotension  Orthostatic hypotension- (present on admission) Likely etiology of patient's near syncopal episode at urgent care.  Antihypertensives are on hold as baseline blood pressure is soft.  Patient wife reported he has required midodrine in the past, does not currently appear to be on it. 1/14: positive orthostatic vitals with BP dropping from 126/76 >> 113/79 >> 85/66 standing.   Patient was reportedly asymptomatic. -- Orthostatic vitals daily -- Fall precautions -- Add midodrine if needed -- PT evaluation -- TED hose  CKD (chronic kidney disease), stage IIIa- (present on admission) Renal function close to baseline.  Baseline creatinine around 1.3-1.5.   Creatinine on admission 1.56.   Creatinine today 1.30. --Monitor BMP  History of pulmonary embolism- (present on admission) Continue Xarelto  Paroxysmal A-fib ([place])- (present on admission) Heart rate is controlled.  Continue Xarelto and amiodarone.  Monitor on telemetry.  Iron deficiency anemia- (present on admission) Hemoglobin stable  on admission 10.3. This morning Hbg 9.8.   Monitor CBC.  Hypertension- (present on admission) Lisinopril held on admission due to soft BP. Home regimen appears to be lisinopril 10 mg daily. Blood was labile  earlier during admission.    Also has history of orthostatic hypotension. 1/16: BP better controlled with resuming lisinopril yesterday.  141/88 this AM.  BP is somewhat labile, and he still have positive orthostatics. --Continue home lisinopril -- Monitor BP closely  GERD (gastroesophageal reflux disease)- (present on admission) Continue PPI  Coronary artery disease- (present on admission) Stable, no active chest pain. Continue pravastatin As needed nitro  Depression- (present on admission) Continue Remeron  Mixed hyperlipidemia- (present on admission) Continue pravastatin  History of GI bleed No evidence of acute bleeding at this time.   On Xarelto for history of PE and A. Fib. Monitor.  Hypophosphatemia Phosphorus low at 2.1>>2.3 today.  Started replacement yesterday. Replacing further with oral Phos-NAK today. -- Monitor Phos level, replace as needed         Consultants: none Procedures performed: none  Disposition: Home Diet recommendation: Regular diet  DISCHARGE MEDICATION: Allergies as of 09/04/2021       Reactions   Bee Venom Swelling   Sulfasalazine Hives, Swelling   Sulfa Antibiotics Hives, Swelling   Warfarin    Warfarin And Related Other (See Comments)   Chest pain        Medication List     TAKE these medications    amiodarone 100 MG tablet Commonly known as: PACERONE Take 1 tablet (100 mg total) by mouth daily.   azelastine 0.1 % nasal spray Commonly known as: ASTELIN Place 2 sprays into both nostrils 2 (two) times daily. Use in each nostril as directed   fenofibrate micronized 130 MG capsule Commonly known as: ANTARA TAKE 1 CAPSULE BY MOUTH  DAILY BEFORE BREAKFAST   finasteride 5 MG tablet Commonly known as: PROSCAR Take 1 tablet (5 mg total) by mouth daily.   levobunolol 0.5 % ophthalmic solution Commonly known as: BETAGAN Place 1 drop into the right eye 2 (two) times daily.   lisinopril 10 MG tablet Commonly known as:  ZESTRIL Take 1 tablet (10 mg total) by mouth daily.   loratadine 10 MG tablet Commonly known as: CLARITIN Take 1 tablet (10 mg total) by mouth daily.   mirtazapine 15 MG tablet Commonly known as: REMERON TAKE ONE TABLET EACH NIGHT   MULTIVITAMIN PO Take 1 tablet by mouth daily.   nitroGLYCERIN 0.4 MG SL tablet Commonly known as: NITROSTAT DISSOLVE UNDER THE TONGUE 1 TABLET EVERY 5 MINUTES AS NEEDED FOR CHEST PAIN   pantoprazole 40 MG tablet Commonly known as: PROTONIX TAKE 1 TABLET BY MOUTH TWICE DAILY.   pravastatin 40 MG tablet Commonly known as: PRAVACHOL Take 1 tablet (40 mg total) by mouth at bedtime.   Rivaroxaban 15 MG Tabs tablet Commonly known as: Xarelto Take 1 tablet (15 mg total) by mouth daily.         Discharge Exam: Filed Weights   08/31/21 1003  Weight: 93 kg   General exam: awake, alert, no acute distress HEENT: atraumatic, clear conjunctiva, anicteric sclera, moist mucus membranes, hearing grossly normal  Respiratory system: CTAB, no wheezes, rales or rhonchi, normal respiratory effort. Cardiovascular system: normal S1/S2, RRR, no JVD, murmurs, rubs, gallops,  no pedal edema.   Gastrointestinal system: soft, NT, ND, no HSM felt, +bowel sounds. Central nervous system: A&O x4. no gross focal neurologic deficits, normal speech Extremities: moves all ,  no edema, normal tone Skin: dry, intact, normal temperature Psychiatry: normal mood, congruent affect, judgement and insight appear normal   Condition at discharge: stable  The results of significant diagnostics from this hospitalization (including imaging, microbiology, ancillary and laboratory) are listed below for reference.   Imaging Studies: DG Chest 2 View  Result Date: 08/31/2021 CLINICAL DATA:  Shortness of breath EXAM: CHEST - 2 VIEW COMPARISON:  09/08/2020 FINDINGS: Lungs are hyperinflated as can be seen with COPD. No focal consolidation. No pleural effusion or pneumothorax. Stable  cardiomegaly. No acute osseous abnormality. IMPRESSION: No active cardiopulmonary disease. Electronically Signed   By: Kathreen Devoid M.D.   On: 08/31/2021 11:29    Microbiology: Results for orders placed or performed during the hospital encounter of 08/31/21  Resp Panel by RT-PCR (Flu A&B, Covid) Nasopharyngeal Swab     Status: Abnormal   Collection Time: 08/31/21 10:03 AM   Specimen: Nasopharyngeal Swab; Nasopharyngeal(NP) swabs in vial transport medium  Result Value Ref Range Status   SARS Coronavirus 2 by RT PCR POSITIVE (A) NEGATIVE Final    Comment: (NOTE) SARS-CoV-2 target nucleic acids are DETECTED.  The SARS-CoV-2 RNA is generally detectable in upper respiratory specimens during the acute phase of infection. Positive results are indicative of the presence of the identified virus, but do not rule out bacterial infection or co-infection with other pathogens not detected by the test. Clinical correlation with patient history and other diagnostic information is necessary to determine patient infection status. The expected result is Negative.  Fact Sheet for Patients: EntrepreneurPulse.com.au  Fact Sheet for Healthcare Providers: IncredibleEmployment.be  This test is not yet approved or cleared by the [place] FDA and  has been authorized for detection and/or diagnosis of SARS-CoV-2 by FDA under an Emergency Use Authorization (EUA).  This EUA will remain in effect (meaning this test can be used) for the duration of  the COVID-19 declaration under Section 564(b)(1) of the A ct, 21 U.S.C. section 360bbb-3(b)(1), unless the authorization is terminated or revoked sooner.     Influenza A by PCR NEGATIVE NEGATIVE Final   Influenza B by PCR NEGATIVE NEGATIVE Final    Comment: (NOTE) The Xpert Xpress SARS-CoV-2/FLU/RSV plus assay is intended as an aid in the diagnosis of influenza from Nasopharyngeal swab specimens and should not be used as a  sole basis for treatment. Nasal washings and aspirates are unacceptable for Xpert Xpress SARS-CoV-2/FLU/RSV testing.  Fact Sheet for Patients: EntrepreneurPulse.com.au  Fact Sheet for Healthcare Providers: IncredibleEmployment.be  This test is not yet approved or cleared by the [place] FDA and has been authorized for detection and/or diagnosis of SARS-CoV-2 by FDA under an Emergency Use Authorization (EUA). This EUA will remain in effect (meaning this test can be used) for the duration of the COVID-19 declaration under Section 564(b)(1) of the Act, 21 U.S.C. section 360bbb-3(b)(1), unless the authorization is terminated or revoked.  Performed at [facility], [address] [place], [place] [address]     Labs: CBC: No results for input(s): WBC, NEUTROABS, HGB, HCT, MCV, PLT in the last 168 hours.  Basic Metabolic Panel: No results for input(s): NA, K, CL, CO2, GLUCOSE, BUN, CREATININE, CALCIUM, MG, PHOS in the last 168 hours.  Liver Function Tests: No results for input(s): AST, ALT, ALKPHOS, BILITOT, PROT, ALBUMIN in the last 168 hours.  CBG: No results for input(s): GLUCAP in the last 168 hours.  Discharge time spent: less than 30 minutes.  Signed: Ezekiel Slocumb, DO [facility] 09/23/2021

## 2021-09-04 NOTE — Care Management Important Message (Signed)
Important Message  Patient Details  Name: William Taylor MRN: [id number] Date of Birth: 04/13/1930   Medicare Important Message Given:  Yes     Juliann Pulse A Affan Callow 09/04/2021, 1:46 PM

## 2021-09-04 NOTE — TOC Progression Note (Signed)
Transition of Care [facility]) - Progression Note    Patient Details  Name: William Taylor MRN: [id number] Date of Birth: 10/16/1929  Transition of Care [facility]) CM/SW [place], RN Phone Number: 09/04/2021, 2:34 PM  Clinical Narrative:   Spoke to patient's wife.  She states she is ill but COVID negative.  Patient's daughter is picking him up today and can assist with care and appointments as needed.  Wife states there are no discharge concerns at this time.         Expected Discharge Plan and Services           Expected Discharge Date: 09/04/21                                     Social Determinants of Health (SDOH) Interventions    Readmission Risk Interventions Readmission Risk Prevention Plan 12/20/2018 12/16/2018  Transportation Screening Complete Complete  PCP or Specialist Appt within 3-5 Days Complete Not Complete  Not Complete comments - No appt. as of yet; will make at [place] or [place] Patient refused Complete  Social Work Consult for [place] Planning/Counseling Patient refused Complete  Palliative Care Screening Complete Not Applicable  Medication Review (RN Care Manager) Complete Complete  Some recent data might be hidden

## 2021-09-05 ENCOUNTER — Ambulatory Visit: Payer: Medicare HMO | Admitting: Cardiovascular Disease

## 2021-09-05 ENCOUNTER — Telehealth: Payer: Self-pay

## 2021-09-05 NOTE — Telephone Encounter (Signed)
Transition Care Management Unsuccessful Follow-up Telephone Call  Date of discharge and from where:  09/04/21 [facility]  Attempts:  1st Attempt  Reason for unsuccessful TCM follow-up call:  No answer/busy. Will follow.

## 2021-09-06 NOTE — Telephone Encounter (Signed)
Transition Care Management Unsuccessful Follow-up Telephone Call  Date of discharge and from where:  09/04/21 [facility]  Attempts:  2nd Attempt  Reason for unsuccessful TCM follow-up call:  Patient declined hospital follow up with PCP at this time. At baseline and independent. Reports scheduled for hospital follow up with specialty clinic. Agrees to call office as needed.   No TCM hospital follow up scheduled at this time per patient.

## 2021-09-22 ENCOUNTER — Other Ambulatory Visit: Payer: Self-pay | Admitting: Family Medicine

## 2021-09-30 NOTE — Progress Notes (Signed)
Date:  10/01/2021   ID:  William Taylor, DOB [birth year], MRN [id number]  Patient Location:  [place] [address]   Provider location:   [facility], [place] office  PCP:  [name], MD  Cardiologist:  Arvid Right [facility]  Chief Complaint  Patient presents with   Discuss having a cardiac cath     Patient c/o shortness of breath with any amount of exertion; symptoms since COVID 3 weeks ago. Medications reviewed by the patient verbally.     History of Present Illness:    William Taylor is a 88 y.o. male  past medical history of coronary artery disease, stent in 2005, cypher LAD atrial fibrillation with RVR in the setting of UTI September 2011,   cardiac catheterization Dec 25 2010, showing 90% OM 2 disease, transferred to [facility]  with DES stent placed, 2.25 x 16 mm POMUS stent 10/2013: Cardiac catheterization performed given his severe chest pain and no disease showing severe small vessel disease, patent stents PE following catheterization started on anticoagulation, atrial fibrillation in 2013,  Last atrial fibrillation July 2018  atypical chest pain July 2018 History of anemia Stress test low risk in 06/2019 Prior rapid weight loss 50 pounds with associated hypotension requiring Northera , Midodrine Florinef , these have been weaned off with weight gain Chronic chest pain Severe asymmetric septal hypertrophy on MRI 1.6 cm septum March 2022 who presents for follow-up of his coronary disease and atrial fibrillation  LOV 05/2021 In the hospital with covid 08/31/21 ED on 08/31/2021 with complaints of cough, shortness of breath, generalized weakness, poor p.o. intake.  He was seen at urgent care and had a near syncopal episode there, referred to the ED.  When seen in the ED here, he was ambulated and dropped oxygen saturation to 83% on room air.  COVID-19 PCR returned positive.  Blood pressure was soft at 99/64 in the ED.  Chest x-ray  negative for pneumonia.  Admitted to [facility] service and started on remdesivir and steroids.  Chest x-ray negative for pneumonia.   COVID-19 PCR in the ED positive.   Patient dropped oxygen saturation to 83% with ambulation in the ED. -remdesivir, IV steroids Positive for orthostatic hypotension 1/14: positive orthostatic vitals with BP dropping from 126/76 >> 113/79 >> 85/66 standing.    In follow-up reports he is fine 97% oxygen at rest at home Has not checked it with exertion Pulse 80 to 90 Denies orthostasis symptoms  Echo 2/22: EF 60 percent Severe asymmetric basal septal hypertrophy.  Cardiac MRI reviewed No outflow tract obstruction  Concerned about periodic chest pain, happening twice a month, does not seem to have had much change in frequency or intensity  EKG personally reviewed by myself on todays visit Normal sinus rhythm no significant ST-T wave changes, consider old inferior MI     Past Medical History:  Diagnosis Date   Cancer [facility])    Prostate, followed by Dr. Jacqlyn Larsen   Colon polyp    Coronary artery disease    a. 2005 s/p PCI LAD ([facility]); b 2012 s/p PCI OM2; c.10/2013 Cath: LAD 42m ISR, 70m, D1 90, D2 70, OM2 80 w/ patent stent;  d. 06/2019 Low risk MV; e. 07/2020 MV: EF 77%, no ischemia/infarct.   Gall stones    Hyperlipidemia    Hypertension    MI (myocardial infarction) ([place])    2015   Orthostatic hypotension    a. prev on  midodrine/florinef/northera.   PAF (paroxysmal atrial fibrillation) (HCC)    a. on xarelto and amio (CHA2DS2VASc = 4).   Skin cancer    Past Surgical History:  Procedure Laterality Date   CARDIAC CATHETERIZATION  10/2013   [facility]   CATARACT EXTRACTION  Oct. 3, 2012   right eye   colonoscopy     COLONOSCOPY W/ POLYPECTOMY  2015   Dr Rayann Heman   COLONOSCOPY WITH PROPOFOL N/A 08/15/2016   Procedure: COLONOSCOPY WITH PROPOFOL;  Surgeon: [name], MD;  Location: [facility] ENDOSCOPY;  Service: Endoscopy;  Laterality: N/A;   CORONARY ANGIOPLASTY   2009   2005; s/p stent   ENTEROSCOPY N/A 09/22/2018   Procedure: ENTEROSCOPY;  Surgeon: Lin Landsman, MD;  Location: [facility] ENDOSCOPY;  Service: Gastroenterology;  Laterality: N/A;   ESOPHAGOGASTRODUODENOSCOPY (EGD) WITH PROPOFOL N/A 08/15/2016   Procedure: ESOPHAGOGASTRODUODENOSCOPY (EGD) WITH PROPOFOL;  Surgeon: [name], MD;  Location: [facility] ENDOSCOPY;  Service: Endoscopy;  Laterality: N/A;   GIVENS CAPSULE STUDY N/A 09/26/2016   Procedure: GIVENS CAPSULE STUDY;  Surgeon: [name], MD;  Location: [facility] ENDOSCOPY;  Service: Endoscopy;  Laterality: N/A;   HERNIA REPAIR  2013   LUMBAR SPINE SURGERY     UPPER GI ENDOSCOPY  Sept 2015   Dr Rayann Heman     Current Meds  Medication Sig   amiodarone (PACERONE) 100 MG tablet Take 1 tablet (100 mg total) by mouth daily.   azelastine (ASTELIN) 0.1 % nasal spray Place 2 sprays into both nostrils 2 (two) times daily. Use in each nostril as directed   fenofibrate micronized (ANTARA) 130 MG capsule TAKE 1 CAPSULE BY MOUTH  DAILY BEFORE BREAKFAST   finasteride (PROSCAR) 5 MG tablet Take 1 tablet (5 mg total) by mouth daily.   levobunolol (BETAGAN) 0.5 % ophthalmic solution Place 1 drop into the right eye 2 (two) times daily.   lisinopril (ZESTRIL) 10 MG tablet Take 1 tablet (10 mg total) by mouth daily.   loratadine (CLARITIN) 10 MG tablet Take 1 tablet (10 mg total) by mouth daily.   mirtazapine (REMERON) 15 MG tablet TAKE ONE TABLET EACH NIGHT   Multiple Vitamin (MULTIVITAMIN PO) Take 1 tablet by mouth daily.   nitroGLYCERIN (NITROSTAT) 0.4 MG SL tablet DISSOLVE UNDER THE TONGUE 1 TABLET EVERY 5 MINUTES AS NEEDED FOR CHEST PAIN   pantoprazole (PROTONIX) 40 MG tablet TAKE 1 TABLET BY MOUTH TWICE DAILY.   pravastatin (PRAVACHOL) 40 MG tablet Take 1 tablet (40 mg total) by mouth at bedtime.   Rivaroxaban (XARELTO) 15 MG TABS tablet Take 1 tablet (15 mg total) by mouth daily.     Allergies:   Bee venom, Sulfasalazine, Sulfa antibiotics, Warfarin, and  Warfarin and related   Social History   Tobacco Use   Smoking status: Never   Smokeless tobacco: Never  Vaping Use   Vaping Use: Never used  Substance Use Topics   Alcohol use: No   Drug use: No     Family Hx: The patient's family history includes Colon cancer in his father; Coronary artery disease in his brother; Other (age of onset: 52) in his brother; Stroke in his mother.  ROS:   Please see the history of present illness.    Review of Systems  Constitutional: Negative.   HENT: Negative.    Respiratory: Negative.    Cardiovascular:  Positive for chest pain.  Gastrointestinal: Negative.   Musculoskeletal: Negative.   Neurological: Negative.   Psychiatric/Behavioral: Negative.    All other systems reviewed and are  negative.  Labs/Other Tests and Data Reviewed:    Recent Labs: 10/17/2020: TSH 8.04 08/31/2021: B Natriuretic Peptide 29.4 09/03/2021: ALT 14; Hemoglobin 9.8; Platelets 203 09/04/2021: BUN 29; Creatinine, Ser 1.37; Magnesium 1.7; Potassium 4.7; Sodium 136   Recent Lipid Panel Lab Results  Component Value Date/Time   CHOL 110 06/28/2016 11:06 AM   CHOL 119 10/27/2013 06:09 AM   TRIG 80.0 06/28/2016 11:06 AM   TRIG 61 10/27/2013 06:09 AM   HDL 29.90 (L) 06/28/2016 11:06 AM   HDL 38 (L) 10/27/2013 06:09 AM   CHOLHDL 4 06/28/2016 11:06 AM   LDLCALC 64 06/28/2016 11:06 AM   LDLCALC 69 10/27/2013 06:09 AM    Wt Readings from Last 3 Encounters:  10/01/21 208 lb 6 oz (94.5 kg)  08/31/21 205 lb 0.4 oz (93 kg)  08/02/21 205 lb (93 kg)     Exam:    BP (!) 158/88 (BP Location: Left Arm, Patient Position: Sitting, Cuff Size: Normal)    Pulse 81    Ht 6\' 4"  (1.93 m)    Wt 208 lb 6 oz (94.5 kg)    SpO2 98%    BMI 25.36 kg/m  Constitutional:  oriented to person, place, and time. No distress.  HENT:  Head: Grossly normal Eyes:  no discharge. No scleral icterus.  Neck: No JVD, no carotid bruits  Cardiovascular: Regular rate and rhythm, no murmurs  appreciated Pulmonary/Chest: Clear to auscultation bilaterally, no wheezes or rails Abdominal: Soft.  no distension.  no tenderness.  Musculoskeletal: Normal range of motion Neurological:  normal muscle tone. Coordination normal. No atrophy Skin: Skin warm and dry Psychiatric: normal affect, pleasant  ASSESSMENT & PLAN:    Severe orthostasis Associated with dramatic weeight loss in 2020 Previously on Northera ,  Florinef,  midodrine  With stabilized weight, orthostasis symptoms have improved Had orthostasis in the setting of COVID infection, reports he has since recovered  Mixed hyperlipidemia  Cholesterol is at goal on the current lipid regimen. No changes to the medications were made.  Coronary artery disease involving native coronary artery of native heart without angina pectoris Chronic chest pain dating back many years Cardiac catheterization for chest pain 2015, medical management recommended at that time stress test no ischemia, 06/2019 and in 2021 2 episodes per month he reports Cardiac catheterization has been offered in the past, he will call us if symptoms get worse  Atrial fibrillation, unspecified type ([place]) on Xarelto, amiodarone event monitor no significant atrial fibrillation Normal sinus rhythm  Depression Management primary care  Chronic kidney disease (CKD), stage III (moderate) Stable numbers   Iron deficiency anemia, unspecified iron deficiency anemia type Baseline anemia    Total encounter time more than 40 minutes  Greater than 50% was spent in counseling and coordination of care with the patient  Signed, Ida Rogue, MD  10/01/2021 2:41 PM    [place] [id number], [place], [place] [address]

## 2021-10-01 ENCOUNTER — Other Ambulatory Visit: Payer: Self-pay

## 2021-10-01 ENCOUNTER — Ambulatory Visit: Payer: Medicare HMO | Admitting: Cardiovascular Disease

## 2021-10-01 ENCOUNTER — Encounter: Payer: Self-pay | Admitting: Cardiovascular Disease

## 2021-10-01 VITALS — BP 158/88 | HR 81 | Ht 76.0 in | Wt 208.4 lb

## 2021-10-01 DIAGNOSIS — I480 Paroxysmal atrial fibrillation: Secondary | ICD-10-CM | POA: Diagnosis not present

## 2021-10-01 DIAGNOSIS — Z86711 Personal history of pulmonary embolism: Secondary | ICD-10-CM

## 2021-10-01 DIAGNOSIS — I25118 Atherosclerotic heart disease of native coronary artery with other forms of angina pectoris: Secondary | ICD-10-CM | POA: Diagnosis not present

## 2021-10-01 DIAGNOSIS — I951 Orthostatic hypotension: Secondary | ICD-10-CM | POA: Diagnosis not present

## 2021-10-01 DIAGNOSIS — I10 Essential (primary) hypertension: Secondary | ICD-10-CM | POA: Diagnosis not present

## 2021-10-01 DIAGNOSIS — E785 Hyperlipidemia, unspecified: Secondary | ICD-10-CM

## 2021-10-01 DIAGNOSIS — R079 Chest pain, unspecified: Secondary | ICD-10-CM

## 2021-10-01 MED ORDER — NITROGLYCERIN 0.4 MG SL SUBL: SUBLINGUAL_TABLET | SUBLINGUAL | 3 refills | Status: DC

## 2021-10-01 NOTE — Patient Instructions (Addendum)
Medication Instructions:  ?No changes ? ?If you need a refill on your cardiac medications before your next appointment, please call your pharmacy.  ? ?Lab work: ?No new labs needed ? ?Testing/Procedures: ?No new testing needed ? ?Follow-Up: ?At [facility], you and your health needs are our priority.  As part of our continuing mission to provide you with exceptional heart care, we have created designated Provider Care Teams.  These Care Teams include your primary Cardiologist (physician) and Advanced Practice Providers (APPs -  Physician Assistants and Nurse Practitioners) who all work together to provide you with the care you need, when you need it. ? ?You will need a follow up appointment in 6 months, APP ok ? ?Providers on your designated Care Team:   ?Christopher Berge, NP ?Ryan Dunn, PA-C ?Cadence Furth, PA-C ? ?COVID-19 Vaccine Information can be found at: [email or web address] For questions related to vaccine distribution or appointments, please email [email or web address] or call [phone].  ? ?

## 2021-10-22 ENCOUNTER — Telehealth: Payer: Self-pay | Admitting: Family Medicine

## 2021-10-22 ENCOUNTER — Encounter: Payer: Self-pay | Admitting: Family Medicine

## 2021-10-22 DIAGNOSIS — Z760 Encounter for issue of repeat prescription: Secondary | ICD-10-CM

## 2021-10-22 MED ORDER — PRAVASTATIN SODIUM 40 MG PO TABS: 40.0000 mg | ORAL_TABLET | Freq: Every day | ORAL | 3 refills | Status: DC

## 2021-10-22 NOTE — Telephone Encounter (Signed)
MyChart message: ? ?Message ? ?Appointment Request From: William Taylor  ?  ?With Provider: Tommi Rumps, MD [facility] [place]  ?  ?Preferred Date Range: Any  ?  ?Preferred Times: Any Time  ?  ?Reason for visit: Office Visit  ?  ?Comments:  ?am having stomach pains start to the left of my belly button and at times extends into my groin  ? ?Appointment Request ?(Newest Message First) ?William Taylor  Patient Appointment Schedule Request Pool 3 days ago  ? ?Appointment Request From: William Taylor ?  ?With Provider: Tommi Rumps, MD [facility] [place] ?  ?Preferred Date Range: Any ?  ?Preferred Times: Any Time ?  ?Reason for visit: Office Visit ?  ?Comments: ?am having stomach pains start to the left of my belly button and at times extends into my groin ?

## 2021-10-22 NOTE — Telephone Encounter (Signed)
I called and spoke with patients wife and he is scheduled to be seen on Wednesday about his stomach pain.  William Taylor,cma  ?

## 2021-10-24 ENCOUNTER — Encounter: Payer: Self-pay | Admitting: Family Medicine

## 2021-10-24 ENCOUNTER — Telehealth: Payer: Self-pay | Admitting: Family Medicine

## 2021-10-24 ENCOUNTER — Ambulatory Visit: Payer: Medicare HMO | Admitting: Family Medicine

## 2021-10-24 ENCOUNTER — Other Ambulatory Visit: Payer: Self-pay

## 2021-10-24 VITALS — BP 140/90 | HR 85 | Temp 97.6°F | Ht 76.0 in | Wt 205.4 lb

## 2021-10-24 DIAGNOSIS — R1032 Left lower quadrant pain: Secondary | ICD-10-CM

## 2021-10-24 NOTE — Patient Instructions (Signed)
Nice to see you. ?We will get a CT scan scheduled. ?If you have severe abdominal pain, worsening symptoms, absence of bowel movements and gas with the abdominal pain or any new symptoms you need to seek medical attention. ?

## 2021-10-24 NOTE — Progress Notes (Signed)
?Tommi Rumps, MD ?Phone: [phone] ? ?William Taylor is a 88 y.o. male who presents today for same-day visit. ? ?Left lower quadrant abdominal pain/groin pain: Patient reports 1 week ago he woke up at night with right-sided lower abdominal discomfort radiating to his right groin.  Notes over the last 3 to 4 days it has progressively improved and is almost completely gone now.  Notes some discomfort if he inhales a little bit.  No nausea, vomiting, diarrhea, blood in stool, dysuria, or hematuria.  He notes at worst it was 4-5 out of 10 discomfort.  He does have a history of a left inguinal hernia. ? ?Social History  ? ?Tobacco Use  ?Smoking Status Never  ?Smokeless Tobacco Never  ? ? ?Current Outpatient Medications on File Prior to Visit  ?Medication Sig Dispense Refill  ? amiodarone (PACERONE) 100 MG tablet Take 1 tablet (100 mg total) by mouth daily. 90 tablet 3  ? fenofibrate micronized (ANTARA) 130 MG capsule TAKE 1 CAPSULE BY MOUTH  DAILY BEFORE BREAKFAST 90 capsule 3  ? finasteride (PROSCAR) 5 MG tablet Take 1 tablet (5 mg total) by mouth daily. 90 tablet 3  ? levobunolol (BETAGAN) 0.5 % ophthalmic solution Place 1 drop into the right eye 2 (two) times daily.    ? lisinopril (ZESTRIL) 10 MG tablet Take 1 tablet (10 mg total) by mouth daily. 90 tablet 3  ? Multiple Vitamin (MULTIVITAMIN PO) Take 1 tablet by mouth daily.    ? pantoprazole (PROTONIX) 40 MG tablet TAKE 1 TABLET BY MOUTH TWICE DAILY. 60 tablet 1  ? pravastatin (PRAVACHOL) 40 MG tablet Take 1 tablet (40 mg total) by mouth at bedtime. 90 tablet 3  ? Rivaroxaban (XARELTO) 15 MG TABS tablet Take 1 tablet (15 mg total) by mouth daily. 90 tablet 3  ? loratadine (CLARITIN) 10 MG tablet Take 1 tablet (10 mg total) by mouth daily. (Patient not taking: Reported on 10/24/2021) 30 tablet 1  ? nitroGLYCERIN (NITROSTAT) 0.4 MG SL tablet DISSOLVE UNDER THE TONGUE 1 TABLET EVERY 5 MINUTES AS NEEDED FOR CHEST PAIN (Patient not taking: Reported on 10/24/2021)  25 tablet 3  ? ?No current facility-administered medications on file prior to visit.  ? ? ? ?ROS see history of present illness ? ?Objective ? ?Physical Exam ?Vitals:  ? 10/24/21 1324  ?BP: 140/90  ?Pulse: 85  ?Temp: 97.6 ?F (36.4 ?C)  ?SpO2: 97%  ? ? ?BP Readings from Last 3 Encounters:  ?10/24/21 140/90  ?10/01/21 (!) 158/88  ?09/04/21 (!) 158/89  ? ?Wt Readings from Last 3 Encounters:  ?10/24/21 205 lb 6.4 oz (93.2 kg)  ?10/01/21 208 lb 6 oz (94.5 kg)  ?08/31/21 205 lb 0.4 oz (93 kg)  ? ? ?Physical Exam ?Abdominal:  ?   General: Bowel sounds are normal. There is no distension.  ?   Palpations: Abdomen is soft. There is no mass.  ?   Tenderness: There is no guarding or rebound.  ?   Comments: Slight discomfort on palpation of the left lower quadrant  ?Genitourinary: ?   Comments: Possible left inguinal hernia noted, no hernia on the right ? ? ? ?Assessment/Plan: Please see individual problem list. ? ?Problem List Items Addressed This Visit   ? ? Abdominal pain - Primary  ?  The patient generally has a reassuring exam today.  His symptoms have been progressively improving.  The concern at this point would be for an inguinal hernia.  We will obtain a CT scan to evaluate  further to confirm if a hernia is present or if there is some other cause present.  He is advised to let us know if he has any worsening symptoms.  He was advised to seek medical attention if he develops severe symptoms, absence of bowel movements and flatulence in the setting of abdominal pain or any new symptoms. ?  ?  ? Relevant Orders  ? CT Abdomen Pelvis Wo Contrast  ? ? ? ?Return if symptoms worsen or fail to improve. ? ?This visit occurred during the SARS-CoV-2 public health emergency.  Safety protocols were in place, including screening questions prior to the visit, additional usage of staff PPE, and extensive cleaning of exam room while observing appropriate contact time as indicated for disinfecting solutions.  ? ? ?Tommi Rumps,  MD ?[place] ? ?

## 2021-10-24 NOTE — Telephone Encounter (Signed)
Lft pt vm to call ofc to sch CT ab and pelvis. thanks ?

## 2021-10-24 NOTE — Assessment & Plan Note (Signed)
The patient generally has a reassuring exam today.  His symptoms have been progressively improving.  The concern at this point would be for an inguinal hernia.  We will obtain a CT scan to evaluate further to confirm if a hernia is present or if there is some other cause present.  He is advised to let us know if he has any worsening symptoms.  He was advised to seek medical attention if he develops severe symptoms, absence of bowel movements and flatulence in the setting of abdominal pain or any new symptoms. ?

## 2021-11-09 ENCOUNTER — Ambulatory Visit
Admission: RE | Admit: 2021-11-09 | Discharge: 2021-11-09 | Disposition: A | Discharge status: Home / Self Care | Payer: Medicare HMO | Source: Ambulatory Visit | Attending: Family Medicine | Admitting: Family Medicine

## 2021-11-09 ENCOUNTER — Other Ambulatory Visit: Payer: Self-pay

## 2021-11-09 DIAGNOSIS — R1032 Left lower quadrant pain: Secondary | ICD-10-CM | POA: Insufficient documentation

## 2021-11-20 ENCOUNTER — Other Ambulatory Visit: Payer: Self-pay | Admitting: *Deleted

## 2021-11-20 ENCOUNTER — Telehealth: Payer: Self-pay | Admitting: Urology

## 2021-11-20 MED ORDER — FINASTERIDE 5 MG PO TABS: 5.0000 mg | ORAL_TABLET | Freq: Every day | ORAL | 0 refills | Status: DC

## 2021-11-20 NOTE — Telephone Encounter (Signed)
finasteride (PROSCAR) 5 MG tablet ? ? ?[facility] ? ?

## 2021-11-21 ENCOUNTER — Other Ambulatory Visit: Payer: Self-pay | Admitting: Cardiovascular Disease

## 2022-01-23 ENCOUNTER — Other Ambulatory Visit: Payer: Self-pay | Admitting: Family Medicine

## 2022-02-13 ENCOUNTER — Other Ambulatory Visit: Payer: Self-pay | Admitting: Cardiovascular Disease

## 2022-02-14 NOTE — Telephone Encounter (Signed)
Refill request

## 2022-02-14 NOTE — Telephone Encounter (Signed)
Prescription refill request for Xarelto received.  Indication: PAF Last office visit: 10/01/21  Johnny Bridge MD Weight: 94.5kg Age: 88 Scr: 1.37 on 09/04/21 CrCl:  47.90  Based on above findings Xarelto '15mg'$  daily is the appropriate dose.  Refill approved.

## 2022-02-23 ENCOUNTER — Other Ambulatory Visit: Payer: Self-pay | Admitting: Gastroenterology

## 2022-02-23 ENCOUNTER — Other Ambulatory Visit: Payer: Self-pay | Admitting: Family Medicine

## 2022-03-14 ENCOUNTER — Telehealth: Payer: Self-pay | Admitting: Cardiovascular Disease

## 2022-03-14 DIAGNOSIS — M4316 Spondylolisthesis, lumbar region: Secondary | ICD-10-CM | POA: Insufficient documentation

## 2022-03-14 NOTE — Telephone Encounter (Signed)
  Pt c/o medication issue:  1. Name of Medication:  cadista 4 mg  cyclobenzaprine 5 mg   2. How are you currently taking this medication (dosage and times per day)?   3. Are you having a reaction (difficulty breathing--STAT)?   4. What is your medication issue? Pt's wife would like to ask Dr. Rockey Situ if its ok for pt to take these medications

## 2022-03-15 NOTE — Telephone Encounter (Signed)
Spoke with patients wife and reviewed provider approval to start medication from a cardiac perspective. She was very grateful that we were able to call back before the weekend because he is in so much pain. She had no further questions at this time.     [name], MD  Valora Corporal, RN Caller: Unspecified (Yesterday,  3:45 PM) Looks like a variant of steroid  Should be fine from cardiac perspective, no significant cardiac effect  Thx  TGollan

## 2022-03-15 NOTE — Telephone Encounter (Signed)
This was added on a different office encounter. Closing this encounter. Please see other encounter for continued details.

## 2022-03-15 NOTE — Telephone Encounter (Signed)
Patient wife clarifying medication name :  Cadista ( methylprednisolone) 4 mg tabs

## 2022-03-15 NOTE — Telephone Encounter (Signed)
To Pharm D- see 03/14/22 phone note.

## 2022-03-15 NOTE — Telephone Encounter (Signed)
Does he mean Cadistan?  Which is an old name for chlorpheniramine?

## 2022-03-15 NOTE — Telephone Encounter (Signed)
Spoke with patients wife and reviewed that our pharmacy team will review his medications and then we will call back with any recommendations. She verbalized understanding.

## 2022-03-15 NOTE — Telephone Encounter (Signed)
Has loratadine on his medication list. Would prefer he take that rather than chlorpheniramine as it is less sedating and has less adverse effects

## 2022-03-15 NOTE — Telephone Encounter (Signed)
Oh ok, I misunderstood. Do we know whats being treated? Do not see any recent visits

## 2022-03-15 NOTE — Telephone Encounter (Signed)
Wife is calling back to see if the patient can take his medication Cadistan. Please advise

## 2022-03-21 ENCOUNTER — Other Ambulatory Visit: Payer: Self-pay | Admitting: Family Medicine

## 2022-03-21 DIAGNOSIS — E782 Mixed hyperlipidemia: Secondary | ICD-10-CM

## 2022-03-27 ENCOUNTER — Ambulatory Visit: Payer: Medicare HMO | Admitting: Family Medicine

## 2022-03-27 ENCOUNTER — Encounter: Payer: Self-pay | Admitting: Family Medicine

## 2022-03-27 VITALS — BP 140/80 | HR 87 | Temp 97.5°F | Ht 76.0 in | Wt 204.2 lb

## 2022-03-27 DIAGNOSIS — R638 Other symptoms and signs concerning food and fluid intake: Secondary | ICD-10-CM | POA: Diagnosis not present

## 2022-03-27 DIAGNOSIS — E678 Other specified hyperalimentation: Secondary | ICD-10-CM

## 2022-03-27 DIAGNOSIS — R3989 Other symptoms and signs involving the genitourinary system: Secondary | ICD-10-CM

## 2022-03-27 NOTE — Patient Instructions (Signed)
Nice to see you. Will check some lab work today and check your urine.  We will contact you with the results.

## 2022-03-28 ENCOUNTER — Other Ambulatory Visit: Payer: Self-pay | Admitting: Family Medicine

## 2022-03-28 DIAGNOSIS — R3989 Other symptoms and signs involving the genitourinary system: Secondary | ICD-10-CM | POA: Insufficient documentation

## 2022-03-28 LAB — HEPATIC FUNCTION PANEL
ALT: 11 U/L (ref 0–53)
AST: 21 U/L (ref 0–37)
Albumin: 4.3 g/dL (ref 3.5–5.2)
Alkaline Phosphatase: 29 U/L — ABNORMAL LOW (ref 39–117)
Bilirubin, Direct: 0.4 mg/dL — ABNORMAL HIGH (ref 0.0–0.3)
Total Bilirubin: 0.9 mg/dL (ref 0.2–1.2)
Total Protein: 6.8 g/dL (ref 6.0–8.3)

## 2022-03-28 LAB — BASIC METABOLIC PANEL
BUN: 35 mg/dL — ABNORMAL HIGH (ref 6–23)
CO2: 23 mEq/L (ref 19–32)
Calcium: 9.4 mg/dL (ref 8.4–10.5)
Chloride: 105 mEq/L (ref 96–112)
Creatinine, Ser: 1.68 mg/dL — ABNORMAL HIGH (ref 0.40–1.50)
GFR: 35.6 mL/min — ABNORMAL LOW (ref >60.00)
Glucose, Bld: 136 mg/dL — ABNORMAL HIGH (ref 70–99)
Potassium: 4.4 mEq/L (ref 3.5–5.1)
Sodium: 137 mEq/L (ref 135–145)

## 2022-03-28 LAB — VITAMIN B12: Vitamin B-12: 634 pg/mL (ref 211–911)

## 2022-03-28 LAB — POCT URINALYSIS DIPSTICK
Bilirubin, UA: NEGATIVE
Blood, UA: NEGATIVE
Glucose, UA: NEGATIVE
Ketones, UA: NEGATIVE
Leukocytes, UA: NEGATIVE
Nitrite, UA: NEGATIVE
Protein, UA: POSITIVE — AB
Spec Grav, UA: 1.025 (ref 1.010–1.025)
Urobilinogen, UA: 1 E.U./dL
pH, UA: 6 (ref 5.0–8.0)

## 2022-03-28 NOTE — Assessment & Plan Note (Signed)
The patient's urine appears to be dark yellow on my observation after he provided a urine sample.  Dipstick will be run on this.  We will check liver enzymes to ensure that there is no obstructive process going on.  I discussed that this could potentially be related to inadequate water intake versus an obstructive process versus the vitamins he has been taking.  We will check a vitamin B12 and B1 level to see if he actually needs those vitamins.

## 2022-03-28 NOTE — Progress Notes (Signed)
Tommi Rumps, MD Phone: [phone]  William Taylor is a 88 y.o. male who presents today for same-day visit.  Orange urine: Patient notes his significant other noted that his urine looked a little orange the day prior to our visit.  He thinks it may just be dark yellow.  He notes he does not drink enough water.  He notes no dysuria.  He notes no change to chronic urinary frequency and urgency.  No blood in his urine.  He has been taking vitamin B12 and vitamin B1.  He notes his bowel movements are normal color.  Social History   Tobacco Use  Smoking Status Never  Smokeless Tobacco Never    Current Outpatient Medications on File Prior to Visit  Medication Sig Dispense Refill   amiodarone (PACERONE) 100 MG tablet TAKE 1 TABLET BY MOUTH DAILY 90 tablet 0   cyclobenzaprine (FLEXERIL) 5 MG tablet Take 5 mg by mouth 3 (three) times daily as needed.     fenofibrate micronized (ANTARA) 130 MG capsule TAKE 1 CAPSULE BY MOUTH  DAILY BEFORE BREAKFAST 90 capsule 3   finasteride (PROSCAR) 5 MG tablet Take 1 tablet (5 mg total) by mouth daily. 30 tablet 0   levobunolol (BETAGAN) 0.5 % ophthalmic solution Place 1 drop into the right eye 2 (two) times daily.     lisinopril (ZESTRIL) 10 MG tablet Take 1 tablet (10 mg total) by mouth daily. 90 tablet 3   loratadine (CLARITIN) 10 MG tablet Take 1 tablet (10 mg total) by mouth daily. 30 tablet 1   mirtazapine (REMERON) 15 MG tablet TAKE ONE TABLET EACH NIGHT 30 tablet 1   Multiple Vitamin (MULTIVITAMIN PO) Take 1 tablet by mouth daily.     nitroGLYCERIN (NITROSTAT) 0.4 MG SL tablet DISSOLVE UNDER THE TONGUE 1 TABLET EVERY 5 MINUTES AS NEEDED FOR CHEST PAIN 25 tablet 3   pantoprazole (PROTONIX) 40 MG tablet TAKE ONE (1) TABLET BY MOUTH TWO TIMES PER DAY 60 tablet 1   pravastatin (PRAVACHOL) 40 MG tablet Take 1 tablet (40 mg total) by mouth at bedtime. 90 tablet 3   Rivaroxaban (XARELTO) 15 MG TABS tablet TAKE 1 TABLET BY MOUTH  DAILY 90 tablet 1    traMADol (ULTRAM) 50 MG tablet Take 50 mg by mouth every 8 (eight) hours as needed.     No current facility-administered medications on file prior to visit.     ROS see history of present illness  Objective  Physical Exam Vitals:   03/27/22 1606  BP: (!) 140/80  Pulse: 87  Temp: (!) 97.5 F (36.4 C)  SpO2: 99%    BP Readings from Last 3 Encounters:  03/27/22 (!) 140/80  10/24/21 140/90  10/01/21 (!) 158/88   Wt Readings from Last 3 Encounters:  03/27/22 204 lb 3.2 oz (92.6 kg)  10/24/21 205 lb 6.4 oz (93.2 kg)  10/01/21 208 lb 6 oz (94.5 kg)    Physical Exam Constitutional:      General: He is not in acute distress.    Appearance: He is not diaphoretic.  Cardiovascular:     Rate and Rhythm: Normal rate and regular rhythm.     Heart sounds: Normal heart sounds.  Pulmonary:     Effort: Pulmonary effort is normal.     Breath sounds: Normal breath sounds.  Abdominal:     General: Bowel sounds are normal. There is no distension.     Tenderness: There is no abdominal tenderness.  Skin:    General: Skin  is warm and dry.  Neurological:     Mental Status: He is alert.      Assessment/Plan: Please see individual problem list.  Problem List Items Addressed This Visit     Abnormal urine color - Primary    The patient's urine appears to be dark yellow on my observation after he provided a urine sample.  Dipstick will be run on this.  We will check liver enzymes to ensure that there is no obstructive process going on.  I discussed that this could potentially be related to inadequate water intake versus an obstructive process versus the vitamins he has been taking.  We will check a vitamin B12 and B1 level to see if he actually needs those vitamins.      Relevant Orders   POCT Urinalysis Dipstick (Completed)   Hepatic function panel (Completed)   Basic Metabolic Panel (BMET) (Completed)   Other Visit Diagnoses     Excessive vitamin B12 intake       Relevant Orders    B12 (Completed)   Excessive intake of thiamine       Relevant Orders   Vitamin B1       Return in about 6 months (around 09/27/2022).   Tommi Rumps, MD [place]

## 2022-03-31 NOTE — Progress Notes (Signed)
Date:  04/01/2022   ID:  William Taylor, DOB 11/21/1929, MRN [id number]  Patient Location:  [place] [address]   Provider location:   [facility], [place] office  PCP:  [name], MD  Cardiologist:  [facility]  Chief Complaint  Patient presents with   6 month follow up     Patient c/o being down in his back and has shortness of breath with little exertion.  Medications reviewed by the patient verbally.     History of Present Illness:    William Taylor is a 88 y.o. male  past medical history of coronary artery disease, stent in 2005, cypher LAD atrial fibrillation with RVR in the setting of UTI September 2011,  cardiac catheterization Dec 25 2010, showing 90% OM 2 disease, transferred to [facility]  with DES stent placed, 2.25 x 16 mm POMUS stent 10/2013: Cardiac catheterization performed given his severe chest pain and no disease showing severe small vessel disease, patent stents PE following catheterization started on anticoagulation, atrial fibrillation in 2013,  Last atrial fibrillation July 2018  atypical chest pain July 2018 History of anemia Stress test low risk in 06/2019 Prior rapid weight loss 50 pounds with associated hypotension requiring Northera , Midodrine Florinef , these have been weaned off with weight gain Chronic chest pain Severe asymmetric septal hypertrophy on MRI 1.6 cm septum March 2022 who presents for follow-up of his coronary disease and atrial fibrillation  Last seen by myself in clinic February 2023  Reports his Remeron is being weaned off Poor fluid intake, recent lab work creatinine up to 1.68 above his baseline creatinine 1.3 Still tired, broken sleep, 3-4 hours No regular exercise program Lots of sitting, watches TV, during commercial breaks will get up and walk around Limited by chronic back pain  No significant lower extremity edema  Feels he has recovered from [place] but still  tired  EKG personally reviewed by myself on todays visit Normal sinus rhythm rate 93 bpm no significant ST or T wave changes  Other past medical history reviewed In the hospital with covid 08/31/21 ED on 08/31/2021 with complaints of cough, shortness of breath, generalized weakness, poor p.o. intake.  He was seen at urgent care and had a near syncopal episode there, referred to the ED.  When seen in the ED here, he was ambulated and dropped oxygen saturation to 83% on room air.  COVID-19 PCR returned positive.  Blood pressure was soft at 99/64 in the ED.  Chest x-ray negative for pneumonia.  Admitted to [facility] service and started on remdesivir and steroids.  Chest x-ray negative for pneumonia.   COVID-19 PCR in the ED positive.   Patient dropped oxygen saturation to 83% with ambulation in the ED. -remdesivir, IV steroids Positive for orthostatic hypotension 1/14: positive orthostatic vitals with BP dropping from 126/76 >> 113/79 >> 85/66 standing.    In follow-up reports he is fine 97% oxygen at rest at home Has not checked it with exertion Pulse 80 to 90 Denies orthostasis symptoms  Echo 2/22: EF 60 percent Severe asymmetric basal septal hypertrophy.  Cardiac MRI reviewed No outflow tract obstruction    Past Medical History:  Diagnosis Date   Cancer [facility] [place])    Prostate, followed by Dr. Jacqlyn Larsen   Colon polyp    Coronary artery disease    a. 2005 s/p PCI LAD ([facility]); b 2012 s/p PCI OM2; c.10/2013 Cath: LAD 71mISR,  79m D1 90, D2 70, OM2 80 w/ patent stent;  d. 06/2019 Low risk MV; e. 07/2020 MV: EF 77%, no ischemia/infarct.   Gall stones    Hyperlipidemia    Hypertension    [place]    [place]   Orthostatic hypotension    a. prev on midodrine/florinef/northera.   PAF (paroxysmal atrial fibrillation) ([facility])    a. on xarelto and amio (CHA2DS2VASc = 4).   Skin cancer    Past Surgical History:  Procedure Laterality Date   CARDIAC CATHETERIZATION  10/2013   [facility]    CATARACT EXTRACTION  Oct. 3, 2012   right eye   colonoscopy     COLONOSCOPY W/ POLYPECTOMY  2015   Dr RRayann Heman  COLONOSCOPY WITH PROPOFOL N/A 08/15/2016   Procedure: COLONOSCOPY WITH PROPOFOL;  Surgeon: KJonathon Bellows MD;  [facility];  Service: Endoscopy;  Laterality: N/A;   CORONARY ANGIOPLASTY  2009   2005; s/p stent   ENTEROSCOPY N/A 09/22/2018   Procedure: ENTEROSCOPY;  Surgeon: VLin Landsman MD;  [facility];  Service: Gastroenterology;  Laterality: N/A;   ESOPHAGOGASTRODUODENOSCOPY (EGD) WITH PROPOFOL N/A 08/15/2016   Procedure: ESOPHAGOGASTRODUODENOSCOPY (EGD) WITH PROPOFOL;  Surgeon: KJonathon Bellows MD;  Location: [facility] ENDOSCOPY;  Service: Endoscopy;  Laterality: N/A;   GIVENS CAPSULE STUDY N/A 09/26/2016   Procedure: GIVENS CAPSULE STUDY;  Surgeon: KJonathon Bellows MD;  Location: [facility] ENDOSCOPY;  Service: Endoscopy;  Laterality: N/A;   HERNIA REPAIR  2013   LUMBAR SPINE SURGERY     UPPER GI ENDOSCOPY  Sept 2015   Dr RRayann Heman    Current Meds  Medication Sig   cyclobenzaprine (FLEXERIL) 5 MG tablet Take 5 mg by mouth 3 (three) times daily as needed.   fenofibrate micronized (ANTARA) 130 MG capsule TAKE 1 CAPSULE BY MOUTH  DAILY BEFORE BREAKFAST   finasteride (PROSCAR) 5 MG tablet Take 1 tablet (5 mg total) by mouth daily.   levobunolol (BETAGAN) 0.5 % ophthalmic solution Place 1 drop into the right eye 2 (two) times daily.   loratadine (CLARITIN) 10 MG tablet Take 1 tablet (10 mg total) by mouth daily.   mirtazapine (REMERON) 15 MG tablet TAKE ONE TABLET EACH NIGHT   Multiple Vitamin (MULTIVITAMIN PO) Take 1 tablet by mouth daily.   nitroGLYCERIN (NITROSTAT) 0.4 MG SL tablet DISSOLVE UNDER THE TONGUE 1 TABLET EVERY 5 MINUTES AS NEEDED FOR CHEST PAIN   pantoprazole (PROTONIX) 40 MG tablet TAKE ONE (1) TABLET BY MOUTH TWO TIMES PER DAY   pravastatin (PRAVACHOL) 40 MG tablet Take 1 tablet (40 mg total) by mouth at bedtime.   traMADol (ULTRAM) 50 MG tablet Take 50 mg  by mouth every 8 (eight) hours as needed.   [DISCONTINUED] amiodarone (PACERONE) 100 MG tablet TAKE 1 TABLET BY MOUTH DAILY   [DISCONTINUED] lisinopril (ZESTRIL) 10 MG tablet Take 1 tablet (10 mg total) by mouth daily.   [DISCONTINUED] Rivaroxaban (XARELTO) 15 MG TABS tablet TAKE 1 TABLET BY MOUTH  DAILY     Allergies:   Bee venom, Sulfasalazine, Sulfa antibiotics, Warfarin, and Warfarin and related   Social History   Tobacco Use   Smoking status: Never   Smokeless tobacco: Never  Vaping Use   Vaping Use: Never used  Substance Use Topics   Alcohol use: No   Drug use: No     Family Hx: The patient's family history includes Colon cancer in his father; Coronary artery disease in his brother; Other (age of onset: 732  in his brother; Stroke in his mother.  ROS:   Please see the history of present illness.    Review of Systems  Constitutional:  Positive for malaise/fatigue.  HENT: Negative.    Respiratory: Negative.    Cardiovascular:  Positive for chest pain.  Gastrointestinal: Negative.   Musculoskeletal: Negative.   Neurological: Negative.   Psychiatric/Behavioral: Negative.    All other systems reviewed and are negative.   Labs/Other Tests and Data Reviewed:    Recent Labs: 08/31/2021: B Natriuretic Peptide 29.4 09/03/2021: Hemoglobin 9.8; Platelets 203 09/04/2021: Magnesium 1.7 03/27/2022: ALT 11; BUN 35; Creatinine, Ser 1.68; Potassium 4.4; Sodium 137   Recent Lipid Panel Lab Results  Component Value Date/Time   CHOL 110 06/28/2016 11:06 AM   CHOL 119 10/27/2013 06:09 AM   TRIG 80.0 06/28/2016 11:06 AM   TRIG 61 10/27/2013 06:09 AM   HDL 29.90 (L) 06/28/2016 11:06 AM   HDL 38 (L) 10/27/2013 06:09 AM   CHOLHDL 4 06/28/2016 11:06 AM   LDLCALC 64 06/28/2016 11:06 AM   LDLCALC 69 10/27/2013 06:09 AM    Wt Readings from Last 3 Encounters:  04/01/22 207 lb 4 oz (94 kg)  03/27/22 204 lb 3.2 oz (92.6 kg)  10/24/21 205 lb 6.4 oz (93.2 kg)     Exam:    BP 106/70  (BP Location: Left Arm, Patient Position: Sitting, Cuff Size: Normal)   Pulse 93   Ht '6\' 4"'$  (1.93 m)   Wt 207 lb 4 oz (94 kg)   SpO2 97%   BMI 25.23 kg/m  Constitutional:  oriented to person, place, and time. No distress.  HENT:  Head: Grossly normal Eyes:  no discharge. No scleral icterus.  Neck: No JVD, no carotid bruits  Cardiovascular: Regular rate and rhythm, no murmurs appreciated Pulmonary/Chest: Clear to auscultation bilaterally, no wheezes or rails Abdominal: Soft.  no distension.  no tenderness.  Musculoskeletal: Normal range of motion Neurological:  normal muscle tone. Coordination normal. No atrophy Skin: Skin warm and dry Psychiatric: normal affect, pleasant   ASSESSMENT & PLAN:    orthostasis Associated with dramatic weight loss in 2020 Previously on Northera ,  Florinef,  midodrine  With stabilized weight, orthostasis symptoms have improved Weight trending down over the past year, denies orthostasis symptoms, medication changes made  Mixed hyperlipidemia  Cholesterol is at goal on the current lipid regimen. No changes to the medications were made.  Coronary artery disease involving native coronary artery of native heart without angina pectoris Chronic chest pain dating back many years Cardiac catheterization for chest pain 2015, medical management recommended at that time stress test no ischemia, 06/2019 and in 07/2020 Denies recent episodes Cardiac catheterization has been offered in the past, will hold off on any further testing at this time  Atrial fibrillation, unspecified type ([place]) on Xarelto, amiodarone event monitor no significant atrial fibrillation Maintaining  sinus rhythm  Depression Management primary care  Chronic kidney disease (CKD), stage III (moderate) CR 1.68, does not drink per patient's wife who presents with him today   Iron deficiency anemia, unspecified iron deficiency anemia type Baseline anemia We will request repeat  CBC    Total encounter time more than 30 minutes  Greater than 50% was spent in counseling and coordination of care with the patient  Signed, Ida Rogue, MD  04/01/2022 2:33 PM    [place] [facility] [id number], [place], [place] [id number]

## 2022-04-01 ENCOUNTER — Telehealth: Payer: Self-pay | Admitting: Family Medicine

## 2022-04-01 ENCOUNTER — Ambulatory Visit: Payer: Medicare HMO | Admitting: Cardiovascular Disease

## 2022-04-01 ENCOUNTER — Other Ambulatory Visit: Payer: Self-pay | Admitting: Family Medicine

## 2022-04-01 ENCOUNTER — Telehealth: Payer: Self-pay

## 2022-04-01 ENCOUNTER — Encounter: Payer: Self-pay | Admitting: Cardiovascular Disease

## 2022-04-01 VITALS — BP 106/70 | HR 93 | Ht 76.0 in | Wt 207.2 lb

## 2022-04-01 DIAGNOSIS — E782 Mixed hyperlipidemia: Secondary | ICD-10-CM

## 2022-04-01 DIAGNOSIS — I517 Cardiomegaly: Secondary | ICD-10-CM

## 2022-04-01 DIAGNOSIS — I25118 Atherosclerotic heart disease of native coronary artery with other forms of angina pectoris: Secondary | ICD-10-CM

## 2022-04-01 DIAGNOSIS — Z86711 Personal history of pulmonary embolism: Secondary | ICD-10-CM

## 2022-04-01 DIAGNOSIS — E785 Hyperlipidemia, unspecified: Secondary | ICD-10-CM

## 2022-04-01 DIAGNOSIS — I10 Essential (primary) hypertension: Secondary | ICD-10-CM | POA: Diagnosis not present

## 2022-04-01 DIAGNOSIS — I480 Paroxysmal atrial fibrillation: Secondary | ICD-10-CM

## 2022-04-01 DIAGNOSIS — I951 Orthostatic hypotension: Secondary | ICD-10-CM | POA: Diagnosis not present

## 2022-04-01 DIAGNOSIS — R000 Tachycardia, unspecified: Secondary | ICD-10-CM

## 2022-04-01 DIAGNOSIS — R079 Chest pain, unspecified: Secondary | ICD-10-CM | POA: Diagnosis not present

## 2022-04-01 DIAGNOSIS — Z5181 Encounter for therapeutic drug level monitoring: Secondary | ICD-10-CM

## 2022-04-01 LAB — VITAMIN B1: Vitamin B1 (Thiamine): 50 nmol/L — ABNORMAL HIGH (ref 8–30)

## 2022-04-01 MED ORDER — RIVAROXABAN 15 MG PO TABS: 15.0000 mg | ORAL_TABLET | Freq: Every day | ORAL | 3 refills | Status: DC

## 2022-04-01 MED ORDER — LISINOPRIL 10 MG PO TABS: 10.0000 mg | ORAL_TABLET | Freq: Every day | ORAL | 3 refills | Status: DC

## 2022-04-01 MED ORDER — AMIODARONE HCL 100 MG PO TABS: 100.0000 mg | ORAL_TABLET | Freq: Every day | ORAL | 3 refills | Status: DC

## 2022-04-01 NOTE — Progress Notes (Signed)
Spoke to Patient to let him know that his B1 is high so Dr. Caryl Bis recommends stop taking the B1 and recheck labs in about 3 months. Patient is scheduled for lab recheck on 9/25 at 11:15.

## 2022-04-01 NOTE — Telephone Encounter (Signed)
Spoke to Patient about his lab results and Dr. Ellen Henri recommendations.

## 2022-04-01 NOTE — Telephone Encounter (Signed)
Spoke to Patient to let him know that his B1 is high so Dr. Caryl Bis recommends stop taking the B1 and recheck labs in about 3 months. Patient is scheduled for lab recheck on 9/25 at 11:15.

## 2022-04-01 NOTE — Patient Instructions (Addendum)
Medication Instructions:  No changes  If you need a refill on your cardiac medications before your next appointment, please call your pharmacy.   Lab work: No new labs needed  Testing/Procedures: No labs today  Follow-Up: At [facility], you and your health needs are our priority.  As part of our continuing mission to provide you with exceptional heart care, we have created designated Provider Care Teams.  These Care Teams include your primary Cardiologist (physician) and Advanced Practice Providers (APPs -  Physician Assistants and Nurse Practitioners) who all work together to provide you with the care you need, when you need it.  You will need a follow up appointment in 6 months  Providers on your designated Care Team:   Murray Hodgkins, NP Christell Faith, PA-C Cadence Kathlen Mody, [place]  COVID-19 Vaccine Information can be found at: [email or web address] For questions related to vaccine distribution or appointments, please email [email or web address] or call [phone].

## 2022-04-01 NOTE — Telephone Encounter (Signed)
Patient returned office phone call for lab results. 

## 2022-04-04 DIAGNOSIS — M792 Neuralgia and neuritis, unspecified: Secondary | ICD-10-CM | POA: Insufficient documentation

## 2022-04-08 ENCOUNTER — Other Ambulatory Visit (INDEPENDENT_AMBULATORY_CARE_PROVIDER_SITE_OTHER): Payer: Medicare HMO

## 2022-04-08 ENCOUNTER — Other Ambulatory Visit: Payer: Medicare HMO

## 2022-04-08 DIAGNOSIS — Z5181 Encounter for therapeutic drug level monitoring: Secondary | ICD-10-CM

## 2022-04-08 LAB — CBC
HCT: 34.4 % — ABNORMAL LOW (ref 38.5–50.0)
Hemoglobin: 11.2 g/dL — ABNORMAL LOW (ref 13.2–17.1)
MCH: 32.4 pg (ref 27.0–33.0)
MCHC: 32.6 g/dL (ref 32.0–36.0)
MCV: 99.4 fL (ref 80.0–100.0)
MPV: 11.2 fL (ref 7.5–12.5)
Platelets: 246 10*3/uL (ref 140–400)
RBC: 3.46 10*6/uL — ABNORMAL LOW (ref 4.20–5.80)
RDW: 13.9 % (ref 11.0–15.0)
WBC: 6 10*3/uL (ref 3.8–10.8)

## 2022-04-08 LAB — HEPATIC FUNCTION PANEL
ALT: 12 U/L (ref 0–53)
AST: 19 U/L (ref 0–37)
Albumin: 4.2 g/dL (ref 3.5–5.2)
Alkaline Phosphatase: 27 U/L — ABNORMAL LOW (ref 39–117)
Bilirubin, Direct: 0.3 mg/dL (ref 0.0–0.3)
Total Bilirubin: 0.9 mg/dL (ref 0.2–1.2)
Total Protein: 6.4 g/dL (ref 6.0–8.3)

## 2022-04-20 ENCOUNTER — Other Ambulatory Visit: Payer: Self-pay | Admitting: Gastroenterology

## 2022-04-27 ENCOUNTER — Inpatient Hospital Stay
Admission: EM | Admit: 2022-04-27 | Discharge: 2022-04-29 | DRG: 287 | Disposition: A | Discharge status: Home / Self Care | Payer: Medicare HMO | Attending: Internal Medicine | Admitting: Internal Medicine

## 2022-04-27 ENCOUNTER — Emergency Department: Payer: Medicare HMO

## 2022-04-27 ENCOUNTER — Other Ambulatory Visit: Payer: Self-pay

## 2022-04-27 DIAGNOSIS — R079 Chest pain, unspecified: Secondary | ICD-10-CM | POA: Diagnosis not present

## 2022-04-27 DIAGNOSIS — R0789 Other chest pain: Secondary | ICD-10-CM | POA: Diagnosis present

## 2022-04-27 DIAGNOSIS — I422 Other hypertrophic cardiomyopathy: Secondary | ICD-10-CM

## 2022-04-27 DIAGNOSIS — I493 Ventricular premature depolarization: Secondary | ICD-10-CM | POA: Diagnosis not present

## 2022-04-27 DIAGNOSIS — M549 Dorsalgia, unspecified: Secondary | ICD-10-CM | POA: Diagnosis present

## 2022-04-27 DIAGNOSIS — Z8249 Family history of ischemic heart disease and other diseases of the circulatory system: Secondary | ICD-10-CM | POA: Diagnosis not present

## 2022-04-27 DIAGNOSIS — Z8546 Personal history of malignant neoplasm of prostate: Secondary | ICD-10-CM

## 2022-04-27 DIAGNOSIS — Z955 Presence of coronary angioplasty implant and graft: Secondary | ICD-10-CM | POA: Diagnosis not present

## 2022-04-27 DIAGNOSIS — I25110 Atherosclerotic heart disease of native coronary artery with unstable angina pectoris: Secondary | ICD-10-CM | POA: Diagnosis not present

## 2022-04-27 DIAGNOSIS — D509 Iron deficiency anemia, unspecified: Secondary | ICD-10-CM | POA: Diagnosis present

## 2022-04-27 DIAGNOSIS — I252 Old myocardial infarction: Secondary | ICD-10-CM | POA: Diagnosis not present

## 2022-04-27 DIAGNOSIS — Z79899 Other long term (current) drug therapy: Secondary | ICD-10-CM

## 2022-04-27 DIAGNOSIS — Z91030 Bee allergy status: Secondary | ICD-10-CM

## 2022-04-27 DIAGNOSIS — Z888 Allergy status to other drugs, medicaments and biological substances status: Secondary | ICD-10-CM

## 2022-04-27 DIAGNOSIS — I25119 Atherosclerotic heart disease of native coronary artery with unspecified angina pectoris: Principal | ICD-10-CM | POA: Diagnosis present

## 2022-04-27 DIAGNOSIS — M5459 Other low back pain: Secondary | ICD-10-CM | POA: Diagnosis not present

## 2022-04-27 DIAGNOSIS — Z20822 Contact with and (suspected) exposure to covid-19: Secondary | ICD-10-CM | POA: Diagnosis present

## 2022-04-27 DIAGNOSIS — G8929 Other chronic pain: Secondary | ICD-10-CM | POA: Diagnosis present

## 2022-04-27 DIAGNOSIS — I480 Paroxysmal atrial fibrillation: Secondary | ICD-10-CM | POA: Diagnosis present

## 2022-04-27 DIAGNOSIS — Z79891 Long term (current) use of opiate analgesic: Secondary | ICD-10-CM

## 2022-04-27 DIAGNOSIS — Z86711 Personal history of pulmonary embolism: Secondary | ICD-10-CM | POA: Diagnosis present

## 2022-04-27 DIAGNOSIS — Z7901 Long term (current) use of anticoagulants: Secondary | ICD-10-CM | POA: Diagnosis not present

## 2022-04-27 DIAGNOSIS — I2510 Atherosclerotic heart disease of native coronary artery without angina pectoris: Secondary | ICD-10-CM | POA: Diagnosis present

## 2022-04-27 DIAGNOSIS — E782 Mixed hyperlipidemia: Secondary | ICD-10-CM | POA: Diagnosis present

## 2022-04-27 DIAGNOSIS — Z85828 Personal history of other malignant neoplasm of skin: Secondary | ICD-10-CM

## 2022-04-27 DIAGNOSIS — Z882 Allergy status to sulfonamides status: Secondary | ICD-10-CM

## 2022-04-27 DIAGNOSIS — N1831 Chronic kidney disease, stage 3a: Secondary | ICD-10-CM | POA: Diagnosis present

## 2022-04-27 DIAGNOSIS — I951 Orthostatic hypotension: Secondary | ICD-10-CM | POA: Diagnosis present

## 2022-04-27 DIAGNOSIS — D631 Anemia in chronic kidney disease: Secondary | ICD-10-CM | POA: Diagnosis present

## 2022-04-27 DIAGNOSIS — I129 Hypertensive chronic kidney disease with stage 1 through stage 4 chronic kidney disease, or unspecified chronic kidney disease: Secondary | ICD-10-CM | POA: Diagnosis present

## 2022-04-27 DIAGNOSIS — K219 Gastro-esophageal reflux disease without esophagitis: Secondary | ICD-10-CM | POA: Diagnosis present

## 2022-04-27 DIAGNOSIS — I10 Essential (primary) hypertension: Secondary | ICD-10-CM | POA: Diagnosis not present

## 2022-04-27 DIAGNOSIS — N183 Chronic kidney disease, stage 3 unspecified: Secondary | ICD-10-CM | POA: Diagnosis present

## 2022-04-27 LAB — CBC
HCT: 35.5 % — ABNORMAL LOW (ref 39.0–52.0)
Hemoglobin: 11.3 g/dL — ABNORMAL LOW (ref 13.0–17.0)
MCH: 31.4 pg (ref 26.0–34.0)
MCHC: 31.8 g/dL (ref 30.0–36.0)
MCV: 98.6 fL (ref 80.0–100.0)
Platelets: 275 10*3/uL (ref 150–400)
RBC: 3.6 MIL/uL — ABNORMAL LOW (ref 4.22–5.81)
RDW: 14.6 % (ref 11.5–15.5)
WBC: 5.7 10*3/uL (ref 4.0–10.5)
nRBC: 0 % (ref 0.0–0.2)

## 2022-04-27 LAB — BASIC METABOLIC PANEL
Anion gap: 9 (ref 5–15)
BUN: 25 mg/dL — ABNORMAL HIGH (ref 8–23)
CO2: 24 mmol/L (ref 22–32)
Calcium: 9.5 mg/dL (ref 8.9–10.3)
Chloride: 108 mmol/L (ref 98–111)
Creatinine, Ser: 1.54 mg/dL — ABNORMAL HIGH (ref 0.61–1.24)
GFR, Estimated: 43 mL/min — ABNORMAL LOW (ref >60)
Glucose, Bld: 125 mg/dL — ABNORMAL HIGH (ref 70–99)
Potassium: 4.2 mmol/L (ref 3.5–5.1)
Sodium: 141 mmol/L (ref 135–145)

## 2022-04-27 LAB — PROTIME-INR
INR: 1.9 — ABNORMAL HIGH (ref 0.8–1.2)
Prothrombin Time: 21.3 seconds — ABNORMAL HIGH (ref 11.4–15.2)

## 2022-04-27 LAB — APTT
aPTT: 30 seconds (ref 24–36)
aPTT: 55 seconds — ABNORMAL HIGH (ref 24–36)

## 2022-04-27 LAB — TROPONIN I (HIGH SENSITIVITY)
Troponin I (High Sensitivity): 11 ng/L (ref <18)
Troponin I (High Sensitivity): 10 ng/L (ref <18)

## 2022-04-27 LAB — SARS CORONAVIRUS 2 BY RT PCR: SARS Coronavirus 2 by RT PCR: NEGATIVE

## 2022-04-27 LAB — HEPARIN LEVEL (UNFRACTIONATED): Heparin Unfractionated: >1.1 IU/mL — ABNORMAL HIGH (ref 0.30–0.70)

## 2022-04-27 MED ORDER — HYDRALAZINE HCL 20 MG/ML IJ SOLN
5.0000 mg | Freq: Four times a day (QID) | INTRAMUSCULAR | Status: DC | PRN
  Administered 2022-04-27 – 2022-04-28 (×2): 5 mg via INTRAVENOUS
  Filled 2022-04-27 (×2): qty 1

## 2022-04-27 MED ORDER — FINASTERIDE 5 MG PO TABS
5.0000 mg | ORAL_TABLET | Freq: Every day | ORAL | Status: DC
  Administered 2022-04-28: 5 mg via ORAL
  Filled 2022-04-27: qty 1

## 2022-04-27 MED ORDER — MORPHINE SULFATE (PF) 2 MG/ML IV SOLN
2.0000 mg | INTRAVENOUS | Status: AC | PRN
  Administered 2022-04-28: 2 mg via INTRAVENOUS
  Filled 2022-04-27: qty 1

## 2022-04-27 MED ORDER — NITROGLYCERIN 2 % TD OINT
1.0000 [in_us] | TOPICAL_OINTMENT | Freq: Four times a day (QID) | TRANSDERMAL | Status: DC | PRN
  Administered 2022-04-28: 1 [in_us] via TOPICAL
  Filled 2022-04-27: qty 1

## 2022-04-27 MED ORDER — AMIODARONE HCL 200 MG PO TABS
100.0000 mg | ORAL_TABLET | Freq: Every day | ORAL | Status: DC
  Administered 2022-04-28 – 2022-04-29 (×2): 100 mg via ORAL
  Filled 2022-04-27 (×2): qty 1

## 2022-04-27 MED ORDER — LORATADINE 10 MG PO TABS: 10.0000 mg | ORAL_TABLET | Freq: Every day | ORAL | Status: DC | PRN

## 2022-04-27 MED ORDER — ACETAMINOPHEN 325 MG PO TABS
650.0000 mg | ORAL_TABLET | Freq: Four times a day (QID) | ORAL | Status: DC | PRN
  Administered 2022-04-28: 650 mg via ORAL
  Filled 2022-04-27: qty 2

## 2022-04-27 MED ORDER — FENOFIBRATE 160 MG PO TABS
160.0000 mg | ORAL_TABLET | Freq: Every day | ORAL | Status: DC
  Administered 2022-04-28: 160 mg via ORAL
  Filled 2022-04-27 (×2): qty 1

## 2022-04-27 MED ORDER — ASPIRIN 81 MG PO CHEW
324.0000 mg | CHEWABLE_TABLET | Freq: Once | ORAL | Status: AC
  Administered 2022-04-27: 324 mg via ORAL
  Filled 2022-04-27: qty 4

## 2022-04-27 MED ORDER — ONDANSETRON HCL 4 MG/2ML IJ SOLN: 4.0000 mg | Freq: Four times a day (QID) | INTRAMUSCULAR | Status: DC | PRN

## 2022-04-27 MED ORDER — PRAVASTATIN SODIUM 40 MG PO TABS
40.0000 mg | ORAL_TABLET | Freq: Every day | ORAL | Status: DC
  Administered 2022-04-27 – 2022-04-28 (×2): 40 mg via ORAL
  Filled 2022-04-27 (×2): qty 1

## 2022-04-27 MED ORDER — ONDANSETRON HCL 4 MG PO TABS: 4.0000 mg | ORAL_TABLET | Freq: Four times a day (QID) | ORAL | Status: DC | PRN

## 2022-04-27 MED ORDER — HEPARIN (PORCINE) 25000 UT/250ML-% IV SOLN
1500.0000 [IU]/h | INTRAVENOUS | Status: DC
  Administered 2022-04-27: 1200 [IU]/h via INTRAVENOUS
  Administered 2022-04-28: 1400 [IU]/h via INTRAVENOUS
  Administered 2022-04-29: 1500 [IU]/h via INTRAVENOUS
  Filled 2022-04-27 (×3): qty 250

## 2022-04-27 MED ORDER — PANTOPRAZOLE SODIUM 40 MG PO TBEC
40.0000 mg | DELAYED_RELEASE_TABLET | Freq: Every day | ORAL | Status: DC
  Administered 2022-04-28: 40 mg via ORAL
  Filled 2022-04-27: qty 1

## 2022-04-27 MED ORDER — ACETAMINOPHEN 650 MG RE SUPP: 650.0000 mg | Freq: Four times a day (QID) | RECTAL | Status: DC | PRN

## 2022-04-27 MED ORDER — LEVOBUNOLOL HCL 0.5 % OP SOLN
1.0000 [drp] | Freq: Two times a day (BID) | OPHTHALMIC | Status: DC
  Administered 2022-04-28 (×2): 1 [drp] via OPHTHALMIC
  Filled 2022-04-27: qty 5

## 2022-04-27 MED ORDER — HEPARIN SODIUM (PORCINE) 5000 UNIT/ML IJ SOLN: 5000.0000 [IU] | Freq: Three times a day (TID) | INTRAMUSCULAR | Status: DC

## 2022-04-27 MED ORDER — LISINOPRIL 10 MG PO TABS
10.0000 mg | ORAL_TABLET | Freq: Every day | ORAL | Status: DC
  Administered 2022-04-27 – 2022-04-28 (×2): 10 mg via ORAL
  Filled 2022-04-27 (×2): qty 1

## 2022-04-27 MED ORDER — NITROGLYCERIN 2 % TD OINT
0.5000 [in_us] | TOPICAL_OINTMENT | TRANSDERMAL | Status: AC
  Administered 2022-04-27: 0.5 [in_us] via TOPICAL
  Filled 2022-04-27: qty 1

## 2022-04-27 MED ORDER — SENNOSIDES-DOCUSATE SODIUM 8.6-50 MG PO TABS: 1.0000 | ORAL_TABLET | Freq: Every evening | ORAL | Status: DC | PRN

## 2022-04-27 MED ORDER — MIRTAZAPINE 15 MG PO TABS
15.0000 mg | ORAL_TABLET | Freq: Every day | ORAL | Status: DC
  Administered 2022-04-27 – 2022-04-28 (×2): 15 mg via ORAL
  Filled 2022-04-27 (×2): qty 1

## 2022-04-27 NOTE — Assessment & Plan Note (Addendum)
Blood pressure elevated this morning, can be secondary to pain.  Received as needed hydralazine. - Lisinopril 10 mg daily resumed - Hydralazine 5 mg IV every 6 hours as needed for SBP greater than 180, 4 days ordered

## 2022-04-27 NOTE — Assessment & Plan Note (Addendum)
-   Pravastatin 40 mg nightly and fenofibrate 150 mg daily resumed

## 2022-04-27 NOTE — Consult Note (Signed)
ANTICOAGULATION CONSULT NOTE - Initial Consult  Pharmacy Consult for heparin infusion Indication: chest pain/ACS  Allergies  Allergen Reactions   Bee Venom Swelling   Sulfasalazine Hives and Swelling   Sulfa Antibiotics Hives and Swelling   Warfarin    Warfarin And Related Other (See Comments)    Chest pain    Patient Measurements: Weight: 94 kg (207 lb 3.7 oz) (per 8/14 office visit) Heparin Dosing Weight: 94 kg  Vital Signs: Temp: 97.8 F (36.6 C) (09/09 1146) Temp Source: Oral (09/09 1146) BP: 132/67 (09/09 1146) Pulse Rate: 79 (09/09 1323)  Labs: Recent Labs    04/27/22 1151 04/27/22 1323  HGB 11.3*  --   HCT 35.5*  --   PLT 275  --   CREATININE 1.54*  --   TROPONINIHS 11 10    Estimated Creatinine Clearance: 39.1 mL/min (A) (by C-G formula based on SCr of 1.54 mg/dL (H)).   Medical History: Past Medical History:  Diagnosis Date   Cancer [facility])    Prostate, followed by Dr. Jacqlyn Larsen   Colon polyp    Coronary artery disease    a. 2005 s/p PCI LAD ([facility]); b 2012 s/p PCI OM2; c.10/2013 Cath: LAD 42mISR, 441mD1 90, D2 70, OM2 80 w/ patent stent;  d. 06/2019 Low risk MV; e. 07/2020 MV: EF 77%, no ischemia/infarct.   Gall stones    Hyperlipidemia    Hypertension    [place]   Orthostatic hypotension    a. prev on midodrine/florinef/northera.   PAF (paroxysmal atrial fibrillation) (HCC)    a. on xarelto and amio (CHA2DS2VASc = 4).   Skin cancer     Medications:  Scheduled:   [START ON 04/28/2022] amiodarone  100 mg Oral Daily   [START ON 04/28/2022] fenofibrate  160 mg Oral Daily   [START ON 04/28/2022] finasteride  5 mg Oral Daily   levobunolol  1 drop Right Eye BID   lisinopril  10 mg Oral Daily   mirtazapine  15 mg Oral QHS   [START ON 04/28/2022] pantoprazole  40 mg Oral Daily   pravastatin  40 mg Oral QHS   Infusions:  PRN: acetaminophen **OR** acetaminophen, loratadine, morphine injection, nitroGLYCERIN, ondansetron  **OR** ondansetron (ZOFRAN) IV, senna-docusate  Assessment: HeMITCHAEL LUCKEYs a [age].o. male presenting with chest tightness x4 days. Patient was on Xarelto for Afib with last dose reported to be on 9/8. PMH significant for CAD (s/p stenting), Afib with RVR, hx of PE. Pharmacy has been consulted to initiate and manage heparin infusion.  Baseline Labs: aPTT 30, HL >1.10, PT 21.3, INR 1.9, Hgb 11.3, Hct 35.5, Plt 275   Goal of Therapy:  Heparin level 0.3-0.7 units/ml aPTT 66-102 seconds Monitor platelets by anticoagulation protocol: Yes   Plan:  Per MD request, no bolus will be given Start heparin infusion at 1200 units/hr Check aPTT level in 8 hours  Monitor HL & aPTT daily for correlation then switch to HL monitoring once correlated  Continue to monitor H&H and platelets daily while on heparin   Thank you for allowing pharmacy to be a part of this patient's care.  CaGretel AcrePharmD PGY1 Pharmacy Resident 04/27/2022 2:44 PM

## 2022-04-27 NOTE — Assessment & Plan Note (Signed)
History of PE after prior cardiac catheterization Home Xarelto is on hold for catheter tomorrow. -Continue with heparin infusion today

## 2022-04-27 NOTE — ED Provider Notes (Signed)
[facility] Provider Note    Event Date/Time   First MD Initiated Contact with Patient 04/27/22 1317     (approximate)   History   Chest Pain   HPI  William Taylor is a 88 y.o. male who on review of cardiology note from August 14 does a history of coronary artery disease with stenting in 2005, atrial fibrillation rapid ventricular response in the setting of UTI, previous cardiac catheterizations with known coronary disease as well as a history of previous pulmonary embolism atrial fibrillation and anticoagulation   First noticed a couple days ago after walking at the mall with his wife chest discomfort and chest pressure that was alleviated after resting and nitroglycerin.  Now for the last 2 days he has noticed exertional chest pain, this morning was moderate to fairly heavy in intensity he took 2 nitroglycerin and was not fully alleviated.  He has taken his Xarelto today as well  Denies abdominal pain no nausea or vomiting.  No fevers or chills.  He reports a very mild 3 out of 10 pressure sensation in the mid chest right now, improved from previous today  Has an extensive history of cardiac disease.  Physical Exam   Triage Vital Signs: ED Triage Vitals  Enc Vitals Group     BP 04/27/22 1146 132/67     Pulse Rate 04/27/22 1146 (!) 48     Resp 04/27/22 1146 17     Temp 04/27/22 1146 97.8 F (36.6 C)     Temp Source 04/27/22 1146 Oral     SpO2 04/27/22 1146 98 %     Weight --      Height --      Head Circumference --      Peak Flow --      Pain Score 04/27/22 1149 4     Pain Loc --      Pain Edu? --      Excl. in [place]? --     Most recent vital signs: Vitals:   04/27/22 1146 04/27/22 1323  BP: 132/67   Pulse: (!) 48 79  Resp: 17 16  Temp: 97.8 F (36.6 C)   SpO2: 98% 99%   Reports he is not allergic to aspirin.  Has taken aspirin previously without difficulty   General: Awake, no distress.  Does not appear in acute distress reports a  3 out of 10 sensation of chest pressure which she describes as very mild CV:  Good peripheral perfusion.  Normal heart tones.  Regular Resp:  Normal effort.  Clear bilateral Abd:  No distention.  Negative Murphy.  No palpation to any abdominal quadrant.  No epigastric tenderness Other:  No lower extremity edema and noted   ED Results / Procedures / Treatments   Labs (all labs ordered are listed, but only abnormal results are displayed) Labs Reviewed  BASIC METABOLIC PANEL - Abnormal; Notable for the following components:      Result Value   Glucose, Bld 125 (*)    BUN 25 (*)    Creatinine, Ser 1.54 (*)    GFR, Estimated 43 (*)    All other components within normal limits  CBC - Abnormal; Notable for the following components:   RBC 3.60 (*)    Hemoglobin 11.3 (*)    HCT 35.5 (*)    All other components within normal limits  TROPONIN I (HIGH SENSITIVITY)  TROPONIN I (HIGH SENSITIVITY)     EKG  Interpreted by me  at 1145 heart rate 90 QRS 90 QTc 479 normal sinus rhythm, no evidence of frank ischemia.  Occasional PAC.  ST segments appear normal     RADIOLOGY Chest x-ray interpreted by me as negative for acute finding    PROCEDURES:  Critical Care performed: No  Procedures   MEDICATIONS ORDERED IN ED: Medications  aspirin chewable tablet 324 mg (324 mg Oral Given 04/27/22 1345)  nitroGLYCERIN (NITROGLYN) 2 % ointment 0.5 inch (0.5 inches Topical Given 04/27/22 1350)     IMPRESSION / MDM / ASSESSMENT AND PLAN / ED COURSE  I reviewed the triage vital signs and the nursing notes.                              Differential diagnosis includes, but is not limited to, acute coronary syndrome in particular etiologies such as unstable angina as the patient's initial troponin is normal and EKG without obvious frank ischemia, high risk chest pain, pneumothorax, etc.  No ripping tearing or moving pain.  History of recurrent episodes of chest pressure, and patient denies and has  no associated abdominal symptoms on exam.  My concern at this point is primarily that this represents unstable angina, and given the patient's elevated heart pathway score indicates need for admission and further testing and cardiology consult.  Discussed the patient's case with Dr. Rockey Situ his cardiologist, and he advised that the neck step for patient would be to consider having a cardiac catheterization as the patient does have a history of intermittent similar symptoms and he does not think another stress test would necessarily be helpful at this point.  Dr. Rockey Situ recommends holding his Xarelto for the next day and tomorrow in anticipation of cardiac catheterization on Monday.  Discussed with patient, patient is at this point agreeable with proceeding with admission and further cardiology consult, and the patient understands that he may require cardiac catheterization Monday which in the past he has declined, but now reports that he is interested in potentially pursuing.   Patient's presentation is most consistent with acute presentation with potential threat to life or bodily function.  The patient is on the cardiac monitor to evaluate for evidence of arrhythmia and/or significant heart rate changes.  I consulted with our hospitalist, and after discussion patient will be admitted for high risk chest pain evaluation to Dr. Tobie Poet  Labs notable for moderate but apparently chronic renal insufficiency, mild anemia also appear to be chronic     FINAL CLINICAL IMPRESSION(S) / ED DIAGNOSES   Final diagnoses:  Chest pain with high risk for cardiac etiology     Rx / DC Orders   ED Discharge Orders     None        Note:  This document was prepared using Dragon voice recognition software and may include unintentional dictation errors.   Delman Kitten, MD 04/27/22 1423

## 2022-04-27 NOTE — Assessment & Plan Note (Addendum)
-   Patient is on Xarelto which will be held on admission - Amiodarone 100 mg daily resumed -Heparin infusion

## 2022-04-27 NOTE — ED Notes (Signed)
Dr. Cox at bedside.  

## 2022-04-27 NOTE — ED Notes (Signed)
EDP at beside. Pt has hx a fib, pt complains of chest tightness 4/10 ongoing since 4 days.

## 2022-04-27 NOTE — Consult Note (Addendum)
ANTICOAGULATION CONSULT NOTE  Pharmacy Consult for heparin infusion Indication: chest pain/ACS  Allergies  Allergen Reactions   Bee Venom Swelling   Sulfasalazine Hives and Swelling   Sulfa Antibiotics Hives and Swelling   Warfarin    Warfarin And Related Other (See Comments)    Chest pain    Patient Measurements: Weight: 94 kg (207 lb 3.7 oz) (per 8/14 office visit) Heparin Dosing Weight: 94 kg  Vital Signs: Temp: 98.2 F (36.8 C) (09/09 1952) Temp Source: Oral (09/09 1708) BP: 135/65 (09/09 1952) Pulse Rate: 44 (09/09 1952)  Labs: Recent Labs    04/27/22 1151 04/27/22 1323 04/27/22 1500 04/27/22 2314  HGB 11.3*  --   --   --   HCT 35.5*  --   --   --   PLT 275  --   --   --   APTT  --   --  30 55*  LABPROT  --   --  21.3*  --   INR  --   --  1.9*  --   HEPARINUNFRC  --   --  >1.10*  --   CREATININE 1.54*  --   --   --   TROPONINIHS 11 10  --   --      Estimated Creatinine Clearance: 39.1 mL/min (A) (by C-G formula based on SCr of 1.54 mg/dL (H)).   Medical History: Past Medical History:  Diagnosis Date   [facility]cer [facility])    Prostate, followed by Dr. Jacqlyn Larsen   Colon polyp    Coronary artery disease    a. 2005 s/p PCI LAD ([facility]); b 2012 s/p PCI OM2; c.10/2013 Cath: LAD 71mISR, 467mD1 90, D2 70, OM2 80 w/ patent stent;  d. 06/2019 Low risk MV; e. 07/2020 MV: EF 77%, no ischemia/infarct.   Gall stones    Hyperlipidemia    Hypertension    [place]   Orthostatic hypotension    a. prev on midodrine/florinef/northera.   PAF (paroxysmal atrial fibrillation) (HCC)    a. on xarelto and amio (CHA2DS2VASc = 4).   Skin cancer     Medications:  Scheduled:   [START ON 04/28/2022] amiodarone  100 mg Oral Daily   [START ON 04/28/2022] fenofibrate  160 mg Oral Daily   [START ON 04/28/2022] finasteride  5 mg Oral Daily   [START ON 04/28/2022] levobunolol  1 drop Right Eye BID   lisinopril  10 mg Oral Daily   mirtazapine  15 mg Oral QHS    [START ON 04/28/2022] pantoprazole  40 mg Oral Daily   pravastatin  40 mg Oral QHS   Infusions:   heparin 1,200 Units/hr (04/27/22 1603)   PRN: acetaminophen **OR** acetaminophen, hydrALAZINE, loratadine, morphine injection, nitroGLYCERIN, ondansetron **OR** ondansetron (ZOFRAN) IV, senna-docusate  Assessment: William MATHERNEs a [age].o. male presenting with chest tightness x4 days. Patient was on Xarelto for Afib with last dose reported to be on 9/8. PMH significant for CAD (s/p stenting), Afib with RVR, hx of PE. Pharmacy has been consulted to initiate and manage heparin infusion.  Baseline Labs: aPTT 30, HL >1.10, PT 21.3, INR 1.9, Hgb 11.3, Hct 35.5, Plt 275   Goal of Therapy:  Heparin level 0.3-0.7 units/ml aPTT 66-102 seconds Monitor platelets by anticoagulation protocol: Yes   9/09 2314 aPTT 55 Plan:  Per MD request, no bolus will be given Increase heparin infusion to 1400 units/hr Check aPTT level in 8 hours  Monitor HL &  aPTT daily for correlation then switch to HL monitoring once correlated  Continue to monitor H&H and platelets daily while on heparin   Thank you for allowing pharmacy to be a part of this patient's care.  Renda Rolls, PharmD, [facility] 04/27/2022 11:45 PM

## 2022-04-27 NOTE — Assessment & Plan Note (Addendum)
Can be contributory to his symptoms as he developed chest pain today after eating breakfast.  But no other definitive relationship with food. -Continue with PPI

## 2022-04-27 NOTE — H&P (Addendum)
History and Physical   [name] WFU:[id number] DOB: 02-14-1930 DOA: 04/27/2022  PCP: William Haven, MD  Outpatient Specialists: Dr. Rockey Taylor, [facility] cardiology Patient coming from: Home  I have personally briefly reviewed patient's old medical records in [place].  Chief Concern: Chest pain  HPI: [name] is a 88 year old male with history of CAD, hypertension, hyperlipidemia, GERD, depression, CKD 3A, history of PE on Xarelto, who presents emergency department for chief concerns of chest pain.  Initial vitals in the emergency department showed temperature of 97.8, respiration rate of 17, heart rate of 48, blood pressure 132/67, SPO2 of 98% on room air.  Serum sodium is 141, potassium 4.2, chloride 108, bicarb 24, BUN of 25, serum creatinine 1.54, GFR 43, nonfasting blood glucose 125, WBC 5.7, hemoglobin 11.3, platelets of 275.  Patient reports he last took his Xarelto dose with dinner on 04/26/2022.  ED treatment: Aspirin 324 mg p.o. one-time dose. Nitroglycerin ointment.  At bedside patient was able to tell me his full name, his age, the current calendar year and he knows he is in the hospital.  He is able to identify his spouse, William Taylor at bedside.  He endorses chest pain/pressure over the last couple of days.  He endorses associated shortness of breath with exertion.  He denies trauma to his person.  He reports the pain has been unrelenting however does decrease in intensity.  He denies abnormal taste in his mouth, radiation to his neck and extremities.  He reports this feels similar to prior episodes of chest pressure about 5 to 7 years ago.  He denies any cough, chills, fever, dysuria, hematuria, diarrhea, syncope.  Social history: He is at home with his wife.  He denies tobacco, EtOH, recreational drug use.  He is retired and formerly worked as a Higher education careers adviser  ROS: Constitutional: no weight change, no fever ENT/Mouth: no sore throat, no rhinorrhea Eyes: no  eye pain, no vision changes Cardiovascular: + chest pain, + dyspnea,  no edema, no palpitations Respiratory: no cough, no sputum, no wheezing Gastrointestinal: no nausea, no vomiting, no diarrhea, no constipation Genitourinary: no urinary incontinence, no dysuria, no hematuria Musculoskeletal: no arthralgias, no myalgias Skin: no skin lesions, no pruritus, Neuro: + weakness, no loss of consciousness, no syncope Psych: no anxiety, no depression, no decrease appetite Heme/Lymph: no bruising, no bleeding  ED Course: Discussed with emergency medicine provider, patient requiring hospitalization for chief concerns of chest pain.  Assessment/Plan  Principal Problem:   Chest pressure Active Problems:   Mixed hyperlipidemia   Paroxysmal atrial fibrillation (HCC)   History of pulmonary embolism   Hypertension   Coronary artery disease   GERD (gastroesophageal reflux disease)   Hypertrophic cardiomyopathy (HCC)   Assessment and Plan:  * Chest pressure Inpatient with CAD - Heparin GTT per pharmacy with notes (no bolus, due to no elevated high sensitive troponin and no EKG changes) ordered - Nitroglycerin 1 inch ointment every 6 hours as needed for chest pain, 5 days ordered - Morphine 2 mg IV every 4 hours as needed for severe pain, 2 days ordered - Cardiac diet now - Patient will need to be n.p.o. on Sunday night in anticipation of left heart cath on Monday, 04/29/2022 - Admit to telemetry cardiac, inpatient  GERD (gastroesophageal reflux disease) - PPI  Hypertension - Lisinopril 10 mg daily resumed - Hydralazine 5 mg IV every 6 hours as needed for SBP greater than 180, 4 days ordered  History of pulmonary embolism - We  will hold Xarelto on admission - AM team to resume when appropriate  Paroxysmal atrial fibrillation ([place]) - Patient is on Xarelto which will be held on admission - Amiodarone 100 mg daily resumed  Mixed hyperlipidemia - Pravastatin 40 mg nightly and  fenofibrate 150 mg daily resumed  Chart reviewed.   DVT prophylaxis: Heparin GGT Code Status: Full code Diet: Heart healthy Family Communication: Updated William Taylor at bedside with patient's permission Disposition Plan: Pending clinical course Consults called: Cardiology via secure chat by myself Admission status: Telemetry cardiac, inpatient  Past Medical History:  Diagnosis Date   Cancer [facility])    Prostate, followed by Dr. Jacqlyn Taylor   Colon polyp    Coronary artery disease    a. 2005 s/p PCI LAD ([facility]); b 2012 s/p PCI OM2; c.10/2013 Cath: LAD 15mISR, 414mD1 90, D2 70, OM2 80 w/ patent stent;  d. 06/2019 Low risk MV; e. 07/2020 MV: EF 77%, no ischemia/infarct.   Gall stones    Hyperlipidemia    Hypertension    MI ([place]ocardial infarction) ([place]   2015   Orthostatic hypotension    a. prev on midodrine/florinef/northera.   PAF (paroxysmal atrial fibrillation) (HCC)    a. on xarelto and amio (CHA2DS2VASc = 4).   Skin cancer    Past Surgical History:  Procedure Laterality Date   CARDIAC CATHETERIZATION  10/2013   [facility]   CATARACT EXTRACTION  Oct. 3, 2012   right eye   colonoscopy     COLONOSCOPY W/ POLYPECTOMY  2015   Dr ReRayann Taylor COLONOSCOPY WITH PROPOFOL N/A 08/15/2016   Procedure: COLONOSCOPY WITH PROPOFOL;  Surgeon: William BellowsMD;  [facility];  Service: Endoscopy;  Laterality: N/A;   CORONARY ANGIOPLASTY  2009   2005; s/p stent   ENTEROSCOPY N/A 09/22/2018   Procedure: ENTEROSCOPY;  Surgeon: William LandsmanMD;  [facility];  Service: Gastroenterology;  Laterality: N/A;   ESOPHAGOGASTRODUODENOSCOPY (EGD) WITH PROPOFOL N/A 08/15/2016   Procedure: ESOPHAGOGASTRODUODENOSCOPY (EGD) WITH PROPOFOL;  Surgeon: William BellowsMD;  Location: [facility] ENDOSCOPY;  Service: Endoscopy;  Laterality: N/A;   GIVENS CAPSULE STUDY N/A 09/26/2016   Procedure: GIVENS CAPSULE STUDY;  Surgeon: William BellowsMD;  Location: [facility] ENDOSCOPY;  Service: Endoscopy;  Laterality: N/A;   HERNIA  REPAIR  2013   LUMBAR SPINE SURGERY     UPPER GI ENDOSCOPY  Sept 2015   Dr ReRayann Taylor Social History:  reports that he has never smoked. He has never used smokeless tobacco. He reports that he does not drink alcohol and does not use drugs.  Allergies  Allergen Reactions   Bee Venom Swelling   Sulfasalazine Hives and Swelling   Sulfa Antibiotics Hives and Swelling   Warfarin    Warfarin And Related Other (See Comments)    Chest pain   Family History  Problem Relation Age of Onset   Stroke Mother    Colon cancer Father    Coronary artery disease Brother    Other Brother [id number]     lymes dz   Family history: Family history reviewed and pertinent cardiac/coronary artery disease in brother.  Prior to Admission medications   Medication Sig Start Date End Date Taking? Authorizing Provider  amiodarone (PACERONE) 100 MG tablet Take 1 tablet (100 mg total) by mouth daily. 04/01/22  Yes GoMinna MerrittsMD  fenofibrate micronized (ANTARA) 130 MG capsule TAKE 1 CAPSULE BY MOUTH  DAILY BEFORE BREAKFAST 03/22/22  Yes SoLeone HavenMD  finasteride (PROSCAR) 5 MG tablet Take 1 tablet (5 mg total) by mouth daily. 11/20/21  Yes Stoioff, Ronda Fairly, MD  levobunolol (BETAGAN) 0.5 % ophthalmic solution Place 1 drop into the right eye 2 (two) times daily.   Yes [provider]  lisinopril (ZESTRIL) 10 MG tablet Take 1 tablet (10 mg total) by mouth daily. 04/01/22  Yes [name], MD  loratadine (CLARITIN) 10 MG tablet Take 1 tablet (10 mg total) by mouth daily. 08/30/20  Yes William Haven, MD  mirtazapine (REMERON) 15 MG tablet TAKE ONE TABLET EACH NIGHT 02/25/22  Yes William Haven, MD  Multiple Vitamin (MULTIVITAMIN PO) Take 1 tablet by mouth daily.   Yes [provider]  nitroGLYCERIN (NITROSTAT) 0.4 MG SL tablet DISSOLVE UNDER THE TONGUE 1 TABLET EVERY 5 MINUTES AS NEEDED FOR CHEST PAIN 10/01/21  Yes Gollan, Kathlene November, MD  pantoprazole (PROTONIX) 40 MG tablet TAKE ONE (1)  TABLET BY MOUTH TWO TIMES PER DAY 04/23/22  Yes [name], MD  pravastatin (PRAVACHOL) 40 MG tablet Take 1 tablet (40 mg total) by mouth at bedtime. 10/22/21  Yes William Haven, MD  Rivaroxaban (XARELTO) 15 MG TABS tablet Take 1 tablet (15 mg total) by mouth daily. 04/01/22  Yes Gollan, Kathlene November, MD  cyclobenzaprine (FLEXERIL) 5 MG tablet Take 5 mg by mouth 3 (three) times daily as needed. Patient not taking: Reported on 04/27/2022 03/14/22   [provider]  gabapentin (NEURONTIN) 100 MG capsule Take 100 mg by mouth daily. Patient not taking: Reported on 04/27/2022 04/04/22   [provider]  traMADol (ULTRAM) 50 MG tablet Take 50 mg by mouth every 8 (eight) hours as needed. Patient not taking: Reported on 04/27/2022 03/25/22   [provider]   Physical Exam: Vitals:   04/27/22 1323 04/27/22 1330 04/27/22 1400 04/27/22 1708  BP:  (!) 149/87  (!) 175/88  Pulse: 79 72  (!) 58  Resp: '16 12  20  '$ Temp:    98 F (36.7 C)  TempSrc:    Oral  SpO2: 99% 98%  100%  Weight:   94 kg    Constitutional: appears age-appropriate, NAD, calm, comfortable Eyes: PERRL, lids and conjunctivae normal ENMT: Mucous membranes are moist. Posterior pharynx clear of any exudate or lesions. Age-appropriate dentition. Hearing appropriate Neck: normal, supple, no masses, no thyromegaly Respiratory: clear to auscultation bilaterally, no wheezing, no crackles. Normal respiratory effort. No accessory muscle use.  Cardiovascular: Irregular regular rate and rhythm, no murmurs / rubs / gallops. No extremity edema. 2+ pedal pulses. No carotid bruits.  Abdomen: no tenderness, no masses palpated, no hepatosplenomegaly. Bowel sounds positive.  Musculoskeletal: no clubbing / cyanosis. No joint deformity upper and lower extremities. Good ROM, no contractures, no atrophy. Normal muscle tone.  Skin: no rashes, lesions, ulcers. No induration Neurologic: Sensation intact. Strength 5/5 in all 4.  Psychiatric:  Normal judgment and insight. Alert and oriented x 3. Normal mood.   EKG: independently reviewed, showing sinus rhythm with PACs, rate of 92, QTc 467  Chest x-ray on Admission: I personally reviewed and I agree with radiologist reading as below.  DG Chest 2 View  Result Date: 04/27/2022 CLINICAL DATA:  Chest pain that started a few days ago EXAM: CHEST - 2 VIEW COMPARISON:  08/31/2021 FINDINGS: Normal heart size and mediastinal contours. Coronary atherosclerosis and stenting. There is no edema, consolidation, effusion, or pneumothorax. Prominent osteopenia. IMPRESSION: Stable from prior.  No acute finding. Electronically Signed   By:  Jorje Guild M.D.   On: 04/27/2022 12:15    Labs on Admission: I have personally reviewed following labs  CBC: Recent Labs  Lab 04/27/22 1151  WBC 5.7  HGB 11.3*  HCT 35.5*  MCV 98.6  PLT 734   Basic Metabolic Panel: Recent Labs  Lab 04/27/22 1151  NA 141  K 4.2  CL 108  CO2 24  GLUCOSE 125*  BUN 25*  CREATININE 1.54*  CALCIUM 9.5   GFR: Estimated Creatinine Clearance: 39.1 mL/min (A) (by C-G formula based on SCr of 1.54 mg/dL (H)).  Urine analysis:    Component Value Date/Time   COLORURINE YELLOW 09/03/2021 0550   APPEARANCEUR CLEAR 09/03/2021 0550   APPEARANCEUR Hazy 02/27/2014 1335   LABSPEC 1.015 09/03/2021 0550   LABSPEC 1.017 02/27/2014 1335   PHURINE 5.0 09/03/2021 0550   GLUCOSEU NEGATIVE 09/03/2021 0550   GLUCOSEU Negative 02/27/2014 1335   HGBUR NEGATIVE 09/03/2021 0550   BILIRUBINUR negative 03/28/2022 1122   BILIRUBINUR Negative 02/27/2014 1335   KETONESUR NEGATIVE 09/03/2021 0550   PROTEINUR Positive (A) 03/28/2022 1122   PROTEINUR NEGATIVE 09/03/2021 0550   UROBILINOGEN 1.0 03/28/2022 1122   NITRITE negative 03/28/2022 1122   NITRITE NEGATIVE 09/03/2021 0550   LEUKOCYTESUR Negative 03/28/2022 1122   LEUKOCYTESUR NEGATIVE 09/03/2021 0550   LEUKOCYTESUR Negative 02/27/2014 1335   CRITICAL CARE Performed by: Dr.  Tobie Poet  Total critical care time: 35 minutes  Critical care time was exclusive of separately billable procedures and treating other patients.  Critical care was necessary to treat or prevent imminent or life-threatening deterioration.  Critical care was time spent personally by me on the following activities: development of treatment plan with patient and/or surrogate as well as nursing, discussions with consultants, evaluation of patient's response to treatment, examination of patient, obtaining history from patient or surrogate, ordering and performing treatments and interventions, ordering and review of laboratory studies, ordering and review of radiographic studies, pulse oximetry and re-evaluation of patient's condition.  Dr. Tobie Poet [facility]  If 7PM-7AM, please contact overnight-coverage provider If 7AM-7PM, please contact day coverage provider [email or web address]  04/27/2022, 5:17 PM

## 2022-04-27 NOTE — Hospital Course (Addendum)
[name] is a 88 year old male with history of CAD, hypertension, hyperlipidemia, GERD, depression, CKD 3A, history of PE on Xarelto, who presents emergency department for chief concerns of chest pain/chest pressure with associated shortness of breath for the past couple of days.  Initial vitals in the emergency department showed temperature of 97.8, respiration rate of 17, heart rate of 48, blood pressure 132/67, SPO2 of 98% on room air.  Serum sodium is 141, potassium 4.2, chloride 108, bicarb 24, BUN of 25, serum creatinine 1.54, GFR 43, nonfasting blood glucose 125, WBC 5.7, hemoglobin 11.3, platelets of 275.  Patient reports he last took his Xarelto dose with dinner on 04/26/2022.  ED treatment: Aspirin 324 mg p.o. one-time dose. Nitroglycerin ointment.  9/10: Blood pressure elevated at 183/109 requiring as needed hydralazine.  Troponin remain negative.  EKG without any significant abnormality.  Patient developed another episode of chest pain after eating breakfast, denies any GERD symptoms. Discussed with cardiology, apparently patient is having these episodic chest pain for a while and in the past refused cardiac catheterization to rule out unstable angina. He agrees to proceed with cardiac catheterization tomorrow. Xarelto will remain on hold and he will continue with heparin for concern of unstable angina until ruled out by cath.  9/11: Patient underwent cardiac catheterization which shows no significant stenosis.  Patent prior stents.  Noted to have some PVC and bigeminy during procedure so he was started on low-dose metoprolol. Patient need to follow-up with PCP and continue on Protonix as GERD might be responsible for his symptoms. Anemia panel with low ferritin but elevated iron levels and saturation.  B12 at 418.  Folate normal.  He will continue on current medications and need to have a close follow-up with his providers for further recommendations.

## 2022-04-27 NOTE — ED Triage Notes (Signed)
Pt presents to ED with c/o of CP that started a few days ago, pt states he did take 1 nitro that relived pain. Pt states MI about 5 years ago. Pt states midsternum pressure that does radiate at this time.   Pt denies endorse some SOB, pt denies any heavy periods of diaphoresis.  Pt denies N/V.

## 2022-04-27 NOTE — Assessment & Plan Note (Addendum)
Patient is experiencing intermittent chest pain/pressure for a while and has refused further investigation with his cardiologist. Troponin remained negative and EKG without any significant changes.  Patient do have an history of CAD. Now agrees to proceed with cardiac cath to determine the cause of these intermittent chest pressures/pain. -Continue with heparin infusion today -Cardiac catheterization tomorrow -Continue with as needed morphine and nitroglycerin.

## 2022-04-28 ENCOUNTER — Other Ambulatory Visit: Payer: Self-pay

## 2022-04-28 DIAGNOSIS — I10 Essential (primary) hypertension: Secondary | ICD-10-CM

## 2022-04-28 DIAGNOSIS — I480 Paroxysmal atrial fibrillation: Secondary | ICD-10-CM

## 2022-04-28 DIAGNOSIS — M5459 Other low back pain: Secondary | ICD-10-CM

## 2022-04-28 DIAGNOSIS — R0789 Other chest pain: Secondary | ICD-10-CM | POA: Diagnosis not present

## 2022-04-28 DIAGNOSIS — I25110 Atherosclerotic heart disease of native coronary artery with unstable angina pectoris: Secondary | ICD-10-CM

## 2022-04-28 LAB — RETICULOCYTES
Immature Retic Fract: 28.4 % — ABNORMAL HIGH (ref 2.3–15.9)
RBC.: 3.36 MIL/uL — ABNORMAL LOW (ref 4.22–5.81)
Retic Count, Absolute: 116.3 10*3/uL (ref 19.0–186.0)
Retic Ct Pct: 3.5 % — ABNORMAL HIGH (ref 0.4–3.1)

## 2022-04-28 LAB — BASIC METABOLIC PANEL
Anion gap: 6 (ref 5–15)
BUN: 21 mg/dL (ref 8–23)
CO2: 24 mmol/L (ref 22–32)
Calcium: 9.1 mg/dL (ref 8.9–10.3)
Chloride: 112 mmol/L — ABNORMAL HIGH (ref 98–111)
Creatinine, Ser: 1.34 mg/dL — ABNORMAL HIGH (ref 0.61–1.24)
GFR, Estimated: 50 mL/min — ABNORMAL LOW (ref >60)
Glucose, Bld: 102 mg/dL — ABNORMAL HIGH (ref 70–99)
Potassium: 3.9 mmol/L (ref 3.5–5.1)
Sodium: 142 mmol/L (ref 135–145)

## 2022-04-28 LAB — APTT
aPTT: 80 seconds — ABNORMAL HIGH (ref 24–36)
aPTT: 73 seconds — ABNORMAL HIGH (ref 24–36)

## 2022-04-28 LAB — CBC
HCT: 29.2 % — ABNORMAL LOW (ref 39.0–52.0)
Hemoglobin: 10.5 g/dL — ABNORMAL LOW (ref 13.0–17.0)
MCH: 36.6 pg — ABNORMAL HIGH (ref 26.0–34.0)
MCHC: 36 g/dL (ref 30.0–36.0)
MCV: 101.7 fL — ABNORMAL HIGH (ref 80.0–100.0)
Platelets: 237 10*3/uL (ref 150–400)
RBC: 2.87 MIL/uL — ABNORMAL LOW (ref 4.22–5.81)
RDW: 16 % — ABNORMAL HIGH (ref 11.5–15.5)
WBC: 6.5 10*3/uL (ref 4.0–10.5)
nRBC: 0 % (ref 0.0–0.2)

## 2022-04-28 LAB — IRON AND TIBC
Iron: 101 ug/dL (ref 45–182)
Saturation Ratios: 23 % (ref 17.9–39.5)
TIBC: 444 ug/dL (ref 250–450)
UIBC: 343 ug/dL

## 2022-04-28 LAB — FERRITIN: Ferritin: 19 ng/mL — ABNORMAL LOW (ref 24–336)

## 2022-04-28 LAB — HEPARIN LEVEL (UNFRACTIONATED): Heparin Unfractionated: 0.84 IU/mL — ABNORMAL HIGH (ref 0.30–0.70)

## 2022-04-28 LAB — VITAMIN B12: Vitamin B-12: 418 pg/mL (ref 180–914)

## 2022-04-28 LAB — FOLATE: Folate: 28 ng/mL (ref >5.9)

## 2022-04-28 MED ORDER — MORPHINE SULFATE (PF) 2 MG/ML IV SOLN: 1.0000 mg | Freq: Once | INTRAVENOUS | Status: DC

## 2022-04-28 MED ORDER — NITROGLYCERIN 0.4 MG SL SUBL: 0.4000 mg | SUBLINGUAL_TABLET | SUBLINGUAL | Status: DC | PRN

## 2022-04-28 NOTE — Plan of Care (Signed)

## 2022-04-28 NOTE — Consult Note (Signed)
ANTICOAGULATION CONSULT NOTE  Pharmacy Consult for heparin infusion Indication: chest pain/ACS  Allergies  Allergen Reactions   Bee Venom Swelling   Sulfasalazine Hives and Swelling   Sulfa Antibiotics Hives and Swelling   Warfarin    Warfarin And Related Other (See Comments)    Chest pain    Patient Measurements: Weight: 94 kg (207 lb 3.7 oz) (per 8/14 office visit) Heparin Dosing Weight: 94 kg  Vital Signs: Temp: 97.9 F (36.6 C) (09/10 0410) Temp Source: Oral (09/10 0410) BP: 120/86 (09/10 0410) Pulse Rate: 82 (09/10 0410)  Labs: Recent Labs    04/27/22 1151 04/27/22 1323 04/27/22 1500 04/27/22 2314 04/28/22 0627 04/28/22 0628  HGB 11.3*  --   --   --  10.5*  --   HCT 35.5*  --   --   --  29.2*  --   PLT 275  --   --   --  237  --   APTT  --   --  30 55*  --  80*  LABPROT  --   --  21.3*  --   --   --   INR  --   --  1.9*  --   --   --   HEPARINUNFRC  --   --  >1.10*  --   --  0.84*  CREATININE 1.54*  --   --   --  1.34*  --   TROPONINIHS 11 10  --   --   --   --      Estimated Creatinine Clearance: 45 mL/min (A) (by C-G formula based on SCr of 1.34 mg/dL (H)).   Medical History: Past Medical History:  Diagnosis Date   Cancer [facility])    Prostate, followed by Dr. Jacqlyn Larsen   Colon polyp    Coronary artery disease    a. 2005 s/p PCI LAD ([facility]); b 2012 s/p PCI OM2; c.10/2013 Cath: LAD 28mISR, 49mD1 90, D2 70, OM2 80 w/ patent stent;  d. 06/2019 Low risk MV; e. 07/2020 MV: EF 77%, no ischemia/infarct.   Gall stones    Hyperlipidemia    Hypertension    [place]   [place]   Orthostatic hypotension    a. prev on midodrine/florinef/northera.   PAF (paroxysmal atrial fibrillation) (HCC)    a. on xarelto and amio (CHA2DS2VASc = 4).   Skin cancer     Medications:  Scheduled:   amiodarone  100 mg Oral Daily   fenofibrate  160 mg Oral Daily   finasteride  5 mg Oral Daily   levobunolol  1 drop Right Eye BID   lisinopril  10 mg Oral  Daily   mirtazapine  15 mg Oral QHS   pantoprazole  40 mg Oral Daily   pravastatin  40 mg Oral QHS   Infusions:   heparin 1,400 Units/hr (04/28/22 0342)   PRN: acetaminophen **OR** acetaminophen, hydrALAZINE, loratadine, morphine injection, nitroGLYCERIN, ondansetron **OR** ondansetron (ZOFRAN) IV, senna-docusate  Assessment: William Taylor a [age].o. male presenting with chest tightness x4 days. Patient was on Xarelto for Afib with last dose reported to be on 9/8. PMH significant for CAD (s/p stenting), Afib with RVR, hx of PE. Pharmacy has been consulted to initiate and manage heparin infusion.  Baseline Labs: aPTT 30, HL >1.10, PT 21.3, INR 1.9, Hgb 11.3, Hct 35.5, Plt 275   Goal of Therapy:  Heparin level 0.3-0.7 units/ml aPTT 66-102 seconds Monitor platelets by anticoagulation protocol: Yes  9/09 2314 aPTT 55 subtherapeutic 9/10 0628 aPTT 80, thera x 1, HL 84, suprathera  Plan:  Per MD request, no bolus will be given Continue heparin infusion at 1400 units/hr Check aPTT level in 8 hours to confirm  Monitor HL & aPTT daily for correlation then switch to HL monitoring once correlated  Continue to monitor H&H and platelets daily while on heparin   Thank you for allowing pharmacy to be a part of this patient's care.  Renda Rolls, PharmD, [facility] 04/28/2022 6:53 AM

## 2022-04-28 NOTE — Progress Notes (Signed)
Progress Note   Patient: William Taylor OQH:[id number] DOB: 1929/12/23 DOA: 04/27/2022     1 DOS: the patient was seen and examined on 04/28/2022   Brief hospital course: [name] is a 88 year old male with history of CAD, hypertension, hyperlipidemia, GERD, depression, CKD 3A, history of PE on Xarelto, who presents emergency department for chief concerns of chest pain/chest pressure with associated shortness of breath for the past couple of days.  Initial vitals in the emergency department showed temperature of 97.8, respiration rate of 17, heart rate of 48, blood pressure 132/67, SPO2 of 98% on room air.  Serum sodium is 141, potassium 4.2, chloride 108, bicarb 24, BUN of 25, serum creatinine 1.54, GFR 43, nonfasting blood glucose 125, WBC 5.7, hemoglobin 11.3, platelets of 275.  Patient reports he last took his Xarelto dose with dinner on 04/26/2022.  ED treatment: Aspirin 324 mg p.o. one-time dose. Nitroglycerin ointment.  9/10: Blood pressure elevated at 183/109 requiring as needed hydralazine.  Troponin remain negative.  EKG without any significant abnormality.  Patient developed another episode of chest pain after eating breakfast, denies any GERD symptoms. Discussed with cardiology, apparently patient is having these episodic chest pain for a while and in the past refused cardiac catheterization to rule out unstable angina. He agrees to proceed with cardiac catheterization tomorrow. Xarelto will remain on hold and he will continue with heparin for concern of unstable angina until ruled out by cath.    Assessment and Plan: * Chest pressure Patient is experiencing intermittent chest pain/pressure for a while and has refused further investigation with his cardiologist. Troponin remained negative and EKG without any significant changes.  Patient do have an history of CAD. Now agrees to proceed with cardiac cath to determine the cause of these intermittent chest  pressures/pain. -Continue with heparin infusion today -Cardiac catheterization tomorrow -Continue with as needed morphine and nitroglycerin.  Coronary artery disease S/p stent placement in 2005 and then in 2012. -See above for management  Paroxysmal atrial fibrillation ([place]) - Patient is on Xarelto which will be held on admission - Amiodarone 100 mg daily resumed -Heparin infusion  GERD (gastroesophageal reflux disease) Can be contributory to his symptoms as he developed chest pain today after eating breakfast.  But no other definitive relationship with food. -Continue with PPI  Hypertension Blood pressure elevated this morning, can be secondary to pain.  Received as needed hydralazine. - Lisinopril 10 mg daily resumed - Hydralazine 5 mg IV every 6 hours as needed for SBP greater than 180, 4 days ordered  History of pulmonary embolism History of PE after prior cardiac catheterization Home Xarelto is on hold for catheter tomorrow. -Continue with heparin infusion today  CKD (chronic kidney disease), stage IIIa Seems stable, creatinine around baseline.  Baseline seems to be around 1.2-1.3 -Monitor renal function -Avoid nephrotoxins  Mixed hyperlipidemia - Pravastatin 40 mg nightly and fenofibrate 150 mg daily resumed  Iron deficiency anemia History of iron deficiency anemia.  Hemoglobin seems stable.  Not on any supplement at home. -Check anemia panel   Subjective: Patient was seen and examined with cardiology today.  Had 8 out of 10 chest pain earlier after eating breakfast.  Denied any GERD symptoms.  Feeling little short of breath and anxious.  Chest pain resolved with nitroglycerin and a dose of morphine.  Agrees to proceed with cardiac cath tomorrow.  Physical Exam: Vitals:   04/28/22 0829 04/28/22 0930 04/28/22 0947 04/28/22 1231  BP: (!) 183/109 (!) 120/53 (!) 107/57  114/64  Pulse: 79 90 87 84  Resp: 17   18  Temp: 98 F (36.7 C)   98 F (36.7 C)  TempSrc:       SpO2: 97% 98% 98% 98%  Weight:       General.  Frail elderly man, in no acute distress. Pulmonary.  Lungs clear bilaterally, normal respiratory effort. CV.  Regular rate and rhythm, no JVD, rub or murmur. Abdomen.  Soft, nontender, nondistended, BS positive. CNS.  Alert and oriented .  No focal neurologic deficit. Extremities.  No edema, no cyanosis, pulses intact and symmetrical. Psychiatry.  Judgment and insight appears normal.  Data Reviewed: Prior data reviewed  Family Communication: Discussed with wife at bedside  Disposition: Status is: Inpatient Remains inpatient appropriate because: Severity of illness   Planned Discharge Destination: Home  DVT prophylaxis.  Heparin infusion Time spent: 50 minutes  This record has been created using Systems analyst. Errors have been sought and corrected,but may not always be located. Such creation errors do not reflect on the standard of care.  Author: Lorella Nimrod, MD 04/28/2022 1:30 PM  For on call review [email or web address].

## 2022-04-28 NOTE — Consult Note (Signed)
ANTICOAGULATION CONSULT NOTE  Pharmacy Consult for heparin infusion Indication: chest pain/ACS  Allergies  Allergen Reactions   Bee Venom Swelling   Sulfasalazine Hives and Swelling   Sulfa Antibiotics Hives and Swelling   Warfarin    Warfarin And Related Other (See Comments)    Chest pain    Patient Measurements: Weight: 94 kg (207 lb 3.7 oz) (per 8/14 office visit) Heparin Dosing Weight: 94 kg  Vital Signs: Temp: 98 F (36.7 C) (09/10 1231) Temp Source: Oral (09/10 0410) BP: 114/64 (09/10 1231) Pulse Rate: 84 (09/10 1231)  Labs: Recent Labs    04/27/22 1151 04/27/22 1323 04/27/22 1500 04/27/22 1500 04/27/22 2314 04/28/22 0627 04/28/22 0628 04/28/22 1436  HGB 11.3*  --   --   --   --  10.5*  --   --   HCT 35.5*  --   --   --   --  29.2*  --   --   PLT 275  --   --   --   --  237  --   --   APTT  --   --  30   < > 55*  --  80* 73*  LABPROT  --   --  21.3*  --   --   --   --   --   INR  --   --  1.9*  --   --   --   --   --   HEPARINUNFRC  --   --  >1.10*  --   --   --  0.84*  --   CREATININE 1.54*  --   --   --   --  1.34*  --   --   TROPONINIHS 11 10  --   --   --   --   --   --    < > = values in this interval not displayed.     Estimated Creatinine Clearance: 45 mL/min (A) (by C-G formula based on SCr of 1.34 mg/dL (H)).   Medical History: Past Medical History:  Diagnosis Date   Cancer [facility])    Prostate, followed by Dr. Jacqlyn Larsen   Colon polyp    Coronary artery disease    a. 2005 s/p PCI LAD ([facility]); b 2012 s/p PCI OM2; c.10/2013 Cath: LAD 4mISR, 4109mD1 90, D2 70, OM2 80 w/ patent stent;  d. 06/2019 Low risk MV; e. 07/2020 MV: EF 77%, no ischemia/infarct.   Gall stones    Hyperlipidemia    Hypertension    [place]   [place]   Orthostatic hypotension    a. prev on midodrine/florinef/northera.   PAF (paroxysmal atrial fibrillation) (HCC)    a. on xarelto and amio (CHA2DS2VASc = 4).   Skin cancer     Medications:  Scheduled:    amiodarone  100 mg Oral Daily   fenofibrate  160 mg Oral Daily   finasteride  5 mg Oral Daily   levobunolol  1 drop Right Eye BID   lisinopril  10 mg Oral Daily   mirtazapine  15 mg Oral QHS    morphine injection  1 mg Intravenous Once   pantoprazole  40 mg Oral Daily   pravastatin  40 mg Oral QHS   Infusions:   heparin 1,400 Units/hr (04/28/22 1204)   PRN: acetaminophen **OR** acetaminophen, hydrALAZINE, loratadine, morphine injection, nitroGLYCERIN, nitroGLYCERIN, ondansetron **OR** ondansetron (ZOFRAN) IV, senna-docusate  Assessment: William LOTTERs a [age].o.  male presenting with chest tightness x4 days. Patient was on Xarelto for Afib with last dose reported to be on 9/8. PMH significant for CAD (s/p stenting), Afib with RVR, hx of PE. Pharmacy has been consulted to initiate and manage heparin infusion.  Baseline Labs: aPTT 30, HL >1.10, PT 21.3, INR 1.9, Hgb 11.3, Hct 35.5, Plt 275   Goal of Therapy:  Heparin level 0.3-0.7 units/ml aPTT 66-102 seconds Monitor platelets by anticoagulation protocol: Yes   9/09 2314 aPTT 55 subtherapeutic 9/10 0628 aPTT 80, thera x 1, HL 0.84, suprathera 9/10 1436 aPTT 73, thera x 2  Plan:  Per MD request, no bolus will be given Continue heparin infusion at 1400 units/hr Check aPTT and HL level w/ AM labs  Monitor HL & aPTT daily for correlation then switch to HL monitoring once correlated  Continue to monitor H&H and platelets daily while on heparin   Thank you for allowing pharmacy to be a part of this patient's care.  Pearla Dubonnet, PharmD Clinical Pharmacist 04/28/2022 3:18 PM

## 2022-04-28 NOTE — Assessment & Plan Note (Signed)
History of iron deficiency anemia.  Hemoglobin seems stable.  Not on any supplement at home. -Check anemia panel

## 2022-04-28 NOTE — Assessment & Plan Note (Signed)
S/p stent placement in 2005 and then in 2012. -See above for management

## 2022-04-28 NOTE — Consult Note (Signed)
Cardiology Consultation   Patient ID: William Taylor MRN: [id number]; DOB: Jan 23, 1930  Admit date: 04/27/2022 Date of Consult: 04/28/2022  PCP:  [name], MD   [place] Providers Cardiologist:[facility] Physician requesting consult:Dr. Reesa Chew Reason for consult: Chest pain/angina  Patient Profile:   William Taylor is a 88 y.o. male with a hx of  coronary artery disease, stent in 2005, cypher LAD atrial fibrillation with RVR in the setting of UTI September 2011,  cardiac catheterization Dec 25 2010, showing 90% OM 2 disease, transferred to [facility]  with DES stent placed, 2.25 x 16 mm POMUS stent 10/2013: Cardiac catheterization performed given his severe chest pain and no disease showing severe small vessel disease, patent stents PE following catheterization started on anticoagulation, atrial fibrillation in 2013,  Last atrial fibrillation July 2018  atypical chest pain July 2018 History of anemia Stress test low risk in 06/2019 Prior rapid weight loss 50 pounds with associated hypotension requiring Northera , Midodrine Florinef , these have been weaned off with weight gain Chronic chest pain Severe asymmetric septal hypertrophy on MRI 1.6 cm septum March 2022 who presents to the emergency room for chest pain  History of Present Illness:   William Taylor reports developing chest pain yesterday low-grade in intensity 3/10.  Presented on exertion, no relief with rest.  He presented to the emergency room.  Cardiac enzymes negative, EKG nonacute.  Given his age and concern for unstable angina symptoms, he was admitted to the hospital for further evaluation.  Review of records indicates long history of chest pain, often discussed on cardiology office visits as well as periodic visit to the emergency room.  Previously declined intervention and preferred to treated with nitro sublingual.   This morning reports having severe chest pain close to 8/10 of unclear  etiology.  He reports it seemed to present after eating breakfast but does not feel it was secondary to GI in etiology.  Nurses gave him morphine and nitro with improvement of his symptoms, currently chest pain-free  EKG repeated showing normal sinus rhythm rate 88 bpm no significant ST-T wave changes  Past Medical History:  Diagnosis Date   Cancer [facility])    Prostate, followed by Dr. Jacqlyn Larsen   Colon polyp    Coronary artery disease    a. 2005 s/p PCI LAD ([facility]); b 2012 s/p PCI OM2; c.10/2013 Cath: LAD 52mISR, 413mD1 90, D2 70, OM2 80 w/ patent stent;  d. 06/2019 Low risk MV; e. 07/2020 MV: EF 77%, no ischemia/infarct.   Gall stones    Hyperlipidemia    Hypertension    [place]   [place]   Orthostatic hypotension    a. prev on midodrine/florinef/northera.   PAF (paroxysmal atrial fibrillation) (HCC)    a. on xarelto and amio (CHA2DS2VASc = 4).   Skin cancer     Past Surgical History:  Procedure Laterality Date   CARDIAC CATHETERIZATION  10/2013   [facility]   CATARACT EXTRACTION  Oct. 3, 2012   right eye   colonoscopy     COLONOSCOPY W/ POLYPECTOMY  2015   Dr ReRayann Heman COLONOSCOPY WITH PROPOFOL N/A 08/15/2016   Procedure: COLONOSCOPY WITH PROPOFOL;  Surgeon: KiJonathon BellowsMD;  [facility];  Service: Endoscopy;  Laterality: N/A;   CORONARY ANGIOPLASTY  2009   2005; s/p stent   ENTEROSCOPY N/A 09/22/2018   Procedure: ENTEROSCOPY;  Surgeon: VaLin LandsmanMD;  [facility]  [facility];  Laterality: N/A;   ESOPHAGOGASTRODUODENOSCOPY (EGD) WITH PROPOFOL N/A 08/15/2016   Procedure: ESOPHAGOGASTRODUODENOSCOPY (EGD) WITH PROPOFOL;  Surgeon: [name], MD;  Location: [facility] ENDOSCOPY;  Service: Endoscopy;  Laterality: N/A;   GIVENS CAPSULE STUDY N/A 09/26/2016   Procedure: GIVENS CAPSULE STUDY;  Surgeon: [name], MD;  Location: [facility] ENDOSCOPY;  Service: Endoscopy;  Laterality: N/A;   HERNIA REPAIR  2013   LUMBAR SPINE SURGERY      UPPER GI ENDOSCOPY  Sept 2015   Dr Rayann Heman     Home Medications:  Prior to Admission medications   Medication Sig Start Date End Date Taking? Authorizing Provider  amiodarone (PACERONE) 100 MG tablet Take 1 tablet (100 mg total) by mouth daily. 04/01/22  Yes [name], MD  fenofibrate micronized (ANTARA) 130 MG capsule TAKE 1 CAPSULE BY MOUTH  DAILY BEFORE BREAKFAST 03/22/22  Yes [name], MD  finasteride (PROSCAR) 5 MG tablet Take 1 tablet (5 mg total) by mouth daily. 11/20/21  Yes Stoioff, Ronda Fairly, MD  levobunolol (BETAGAN) 0.5 % ophthalmic solution Place 1 drop into the right eye 2 (two) times daily.   Yes [provider]  lisinopril (ZESTRIL) 10 MG tablet Take 1 tablet (10 mg total) by mouth daily. 04/01/22  Yes [name], MD  loratadine (CLARITIN) 10 MG tablet Take 1 tablet (10 mg total) by mouth daily. 08/30/20  Yes [name], MD  mirtazapine (REMERON) 15 MG tablet TAKE ONE TABLET EACH NIGHT 02/25/22  Yes [name], MD  Multiple Vitamin (MULTIVITAMIN PO) Take 1 tablet by mouth daily.   Yes [provider]  nitroGLYCERIN (NITROSTAT) 0.4 MG SL tablet DISSOLVE UNDER THE TONGUE 1 TABLET EVERY 5 MINUTES AS NEEDED FOR CHEST PAIN 10/01/21  Yes Lundy Cozart, Kathlene November, MD  pantoprazole (PROTONIX) 40 MG tablet TAKE ONE (1) TABLET BY MOUTH TWO TIMES PER DAY 04/23/22  Yes [name], MD  pravastatin (PRAVACHOL) 40 MG tablet Take 1 tablet (40 mg total) by mouth at bedtime. 10/22/21  Yes [name], MD  Rivaroxaban (XARELTO) 15 MG TABS tablet Take 1 tablet (15 mg total) by mouth daily. 04/01/22  Yes Jerni Selmer, Kathlene November, MD  cyclobenzaprine (FLEXERIL) 5 MG tablet Take 5 mg by mouth 3 (three) times daily as needed. Patient not taking: Reported on 04/27/2022 03/14/22   [provider]  gabapentin (NEURONTIN) 100 MG capsule Take 100 mg by mouth daily. Patient not taking: Reported on 04/27/2022 04/04/22   [provider]  traMADol (ULTRAM) 50 MG  tablet Take 50 mg by mouth every 8 (eight) hours as needed. Patient not taking: Reported on 04/27/2022 03/25/22   [provider]    Inpatient Medications: Scheduled Meds:  amiodarone  100 mg Oral Daily   fenofibrate  160 mg Oral Daily   finasteride  5 mg Oral Daily   levobunolol  1 drop Right Eye BID   lisinopril  10 mg Oral Daily   mirtazapine  15 mg Oral QHS    morphine injection  1 mg Intravenous Once   pantoprazole  40 mg Oral Daily   pravastatin  40 mg Oral QHS   Continuous Infusions:  heparin 1,400 Units/hr (04/28/22 1204)   PRN Meds: acetaminophen **OR** acetaminophen, hydrALAZINE, loratadine, morphine injection, nitroGLYCERIN, nitroGLYCERIN, ondansetron **OR** ondansetron (ZOFRAN) IV, senna-docusate  Allergies:    Allergies  Allergen Reactions   Bee Venom Swelling   Sulfasalazine Hives and Swelling   Sulfa Antibiotics Hives and Swelling   Warfarin  Warfarin And Related Other (See Comments)    Chest pain    Social History:   Social History   Socioeconomic History   Marital status: Married    Spouse name: Not on file   Number of children: Not on file   Years of education: Not on file   Highest education level: Not on file  Occupational History   Occupation: retired   Tobacco Use   Smoking status: Never   Smokeless tobacco: Never  Vaping Use   Vaping Use: Never used  Substance and Sexual Activity   Alcohol use: No   Drug use: No   Sexual activity: Not Currently  Other Topics Concern   Not on file  Social History Narrative   Not on file   Social Determinants of Health   Financial Resource Strain: Low Risk  (03/02/2019)   Overall Financial Resource Strain (CARDIA)    Difficulty of Paying Living Expenses: Not hard at all  Food Insecurity: No Food Insecurity (03/02/2019)   Hunger Vital Sign    Worried About Running Out of Food in the Last Year: Never true    [place] in the Last Year: Never true  Transportation Needs: No Transportation  Needs (03/02/2019)   PRAPARE - Hydrologist (Medical): No    Lack of Transportation (Non-Medical): No  Physical Activity: Not on file  Stress: No Stress Concern Present (09/08/2019)   [place]    Feeling of Stress : Not at all  Social Connections: Unknown (09/08/2019)   Social Connection and Isolation Panel [NHANES]    Frequency of Communication with Friends and Family: More than three times a week    Frequency of Social Gatherings with Friends and Family: Once a week    Attends Religious Services: Not on Diplomatic Services operational officer of Clubs or Organizations: Yes    Attends Archivist Meetings: Not on file    Marital Status: Married  Intimate Partner Violence: Not At Risk (09/08/2019)   Humiliation, Afraid, Rape, and Kick questionnaire    Fear of Current or Ex-Partner: No    Emotionally Abused: No    Physically Abused: No    Sexually Abused: No    Family History:    Family History  Problem Relation Age of Onset   Stroke Mother    Colon cancer Father    Coronary artery disease Brother    Other Brother 41       lymes dz     ROS:  Please see the history of present illness.  Review of Systems  Constitutional: Negative.   HENT: Negative.    Respiratory: Negative.    Cardiovascular:  Positive for chest pain.  Gastrointestinal: Negative.   Musculoskeletal: Negative.   Neurological: Negative.   Psychiatric/Behavioral: Negative.    All other systems reviewed and are negative.   Physical Exam/Data:   Vitals:   04/28/22 0410 04/28/22 0829 04/28/22 0930 04/28/22 0947  BP: 120/86 (!) 183/109 (!) 120/53 (!) 107/57  Pulse: 82 79 90 87  Resp: 18 17    Temp: 97.9 F (36.6 C) 98 F (36.7 C)    TempSrc: Oral     SpO2: 96% 97% 98% 98%  Weight:        Intake/Output Summary (Last 24 hours) at 04/28/2022 1205 Last data filed at 04/28/2022 0800 Gross per 24 hour  Intake 146.7 ml   Output 1550 ml  Net -1403.3  ml      04/27/2022    2:00 PM 04/01/2022    1:58 PM 03/27/2022    4:06 PM  Last 3 Weights  Weight (lbs) 207 lb 3.7 oz 207 lb 4 oz 204 lb 3.2 oz  Weight (kg) 94 kg 94.008 kg 92.625 kg     Body mass index is 25.23 kg/m.  General:  Well nourished, well developed, in no acute distress HEENT: normal Neck: no JVD Vascular: No carotid bruits; Distal pulses 2+ bilaterally Cardiac:  normal S1, S2; RRR; no murmur  Lungs:  clear to auscultation bilaterally, no wheezing, rhonchi or rales  Abd: soft, nontender, no hepatomegaly  Ext: no edema Musculoskeletal:  No deformities, BUE and BLE strength normal and equal Skin: warm and dry  Neuro:  CNs 2-12 intact, no focal abnormalities noted Psych:  Normal affect   EKG:  The EKG was personally reviewed and demonstrates:   Normal sinus rhythm with no significant ST-T wave changes  Telemetry:  Telemetry was personally reviewed and demonstrates:   Normal sinus rhythm  Relevant CV Studies:   Laboratory Data:  High Sensitivity Troponin:   Recent Labs  Lab 04/27/22 1151 04/27/22 1323  TROPONINIHS 11 10     Chemistry Recent Labs  Lab 04/27/22 1151 04/28/22 0627  NA 141 142  K 4.2 3.9  CL 108 112*  CO2 24 24  GLUCOSE 125* 102*  BUN 25* 21  CREATININE 1.54* 1.34*  CALCIUM 9.5 9.1  GFRNONAA 43* 50*  ANIONGAP 9 6    No results for input(s): "PROT", "ALBUMIN", "AST", "ALT", "ALKPHOS", "BILITOT" in the last 168 hours. Lipids No results for input(s): "CHOL", "TRIG", "HDL", "LABVLDL", "LDLCALC", "CHOLHDL" in the last 168 hours.  Hematology Recent Labs  Lab 04/27/22 1151 04/28/22 0627  WBC 5.7 6.5  RBC 3.60* 2.87*  HGB 11.3* 10.5*  HCT 35.5* 29.2*  MCV 98.6 101.7*  MCH 31.4 36.6*  MCHC 31.8 36.0  RDW 14.6 16.0*  PLT 275 237   Thyroid No results for input(s): "TSH", "FREET4" in the last 168 hours.  BNPNo results for input(s): "BNP", "PROBNP" in the last 168 hours.  DDimer No results for input(s):  "DDIMER" in the last 168 hours.   Radiology/Studies:  DG Chest 2 View  Result Date: 04/27/2022 CLINICAL DATA:  Chest pain that started a few days ago EXAM: CHEST - 2 VIEW COMPARISON:  08/31/2021 FINDINGS: Normal heart size and mediastinal contours. Coronary atherosclerosis and stenting. There is no edema, consolidation, effusion, or pneumothorax. Prominent osteopenia. IMPRESSION: Stable from prior.  No acute finding. Electronically Signed   By: Jorje Guild M.D.   On: 04/27/2022 12:15     Assessment and Plan:   Unstable angina Known coronary artery disease, prior stenting to the LAD in 2005 Frequent episodes of chest pain dating back many years as outpatient, previously declined evaluation with catheterization but willing to proceed with catheterization this admission Given recurrence of his symptoms, severity of symptoms as demonstrated this morning, we will recommend cardiac catheterization I have reviewed the risks, indications, and alternatives to cardiac catheterization, possible angioplasty, and stenting with the patient. Risks include but are not limited to bleeding, infection, vascular injury, stroke, myocardial infection, arrhythmia, kidney injury, radiation-related injury in the case of prolonged fluoroscopy use, emergency cardiac surgery, and death. The patient understands the risks of serious complication is 1-2 in 8413 with diagnostic cardiac cath and 1-2% or less with angioplasty/stenting.  -Scheduled for catheterization tomorrow morning with Dr. Fletcher Anon We will continue heparin  infusion, orders placed Continue aspirin, pravastatin, fenofibrate  Proximal atrial fibrillation Maintaining normal sinus rhythm Continue amiodarone, anticoagulation, Not on beta-blocker secondary to hypotension, chronic orthostasis  Chronic back pain Reports followed by [facility] Declined cortisone, now walking with a walker    Total encounter time more than 80 minutes  Greater than 50% was  spent in counseling and coordination of care with the patient   For questions or updates, please contact [place] Please consult [email or web address] for contact info under    Signed, Ida Rogue, MD  04/28/2022 12:05 PM

## 2022-04-28 NOTE — H&P (View-Only) (Signed)
Cardiology Consultation   Patient ID: William Taylor MRN: [id number]; DOB: 28-Jun-1930  Admit date: 04/27/2022 Date of Consult: 04/28/2022  PCP:  [name], MD   [place] Providers Cardiologist:[facility] Physician requesting consult:Dr. Reesa Chew Reason for consult: Chest pain/angina  Patient Profile:   William Taylor is a 88 y.o. male with a hx of  coronary artery disease, stent in 2005, cypher LAD atrial fibrillation with RVR in the setting of UTI September 2011,  cardiac catheterization Dec 25 2010, showing 90% OM 2 disease, transferred to [facility]  with DES stent placed, 2.25 x 16 mm POMUS stent 10/2013: Cardiac catheterization performed given his severe chest pain and no disease showing severe small vessel disease, patent stents PE following catheterization started on anticoagulation, atrial fibrillation in 2013,  Last atrial fibrillation July 2018  atypical chest pain July 2018 History of anemia Stress test low risk in 06/2019 Prior rapid weight loss 50 pounds with associated hypotension requiring Northera , Midodrine Florinef , these have been weaned off with weight gain Chronic chest pain Severe asymmetric septal hypertrophy on MRI 1.6 cm septum March 2022 who presents to the emergency room for chest pain  History of Present Illness:   Mr. Purk reports developing chest pain yesterday low-grade in intensity 3/10.  Presented on exertion, no relief with rest.  He presented to the emergency room.  Cardiac enzymes negative, EKG nonacute.  Given his age and concern for unstable angina symptoms, he was admitted to the hospital for further evaluation.  Review of records indicates long history of chest pain, often discussed on cardiology office visits as well as periodic visit to the emergency room.  Previously declined intervention and preferred to treated with nitro sublingual.   This morning reports having severe chest pain close to 8/10 of unclear  etiology.  He reports it seemed to present after eating breakfast but does not feel it was secondary to GI in etiology.  Nurses gave him morphine and nitro with improvement of his symptoms, currently chest pain-free  EKG repeated showing normal sinus rhythm rate 88 bpm no significant ST-T wave changes  Past Medical History:  Diagnosis Date   Cancer [facility])    Prostate, followed by Dr. Jacqlyn Larsen   Colon polyp    Coronary artery disease    a. 2005 s/p PCI LAD ([facility]); b 2012 s/p PCI OM2; c.10/2013 Cath: LAD 57mISR, 481mD1 90, D2 70, OM2 80 w/ patent stent;  d. 06/2019 Low risk MV; e. 07/2020 MV: EF 77%, no ischemia/infarct.   Gall stones    Hyperlipidemia    Hypertension    [place]   [place]   Orthostatic hypotension    a. prev on midodrine/florinef/northera.   PAF (paroxysmal atrial fibrillation) (HCC)    a. on xarelto and amio (CHA2DS2VASc = 4).   Skin cancer     Past Surgical History:  Procedure Laterality Date   CARDIAC CATHETERIZATION  10/2013   [facility]   CATARACT EXTRACTION  Oct. 3, 2012   right eye   colonoscopy     COLONOSCOPY W/ POLYPECTOMY  2015   Dr ReRayann Heman COLONOSCOPY WITH PROPOFOL N/A 08/15/2016   Procedure: COLONOSCOPY WITH PROPOFOL;  Surgeon: KiJonathon BellowsMD;  [facility];  Service: Endoscopy;  Laterality: N/A;   CORONARY ANGIOPLASTY  2009   2005; s/p stent   ENTEROSCOPY N/A 09/22/2018   Procedure: ENTEROSCOPY;  Surgeon: VaLin LandsmanMD;  [place]:  G[place]ology;  Laterality: N/A;   ESOPHAGOGASTRODUODENOSCOPY (EGD) WITH PROPOFOL N/A 08/15/2016   Procedure: ESOPHAGOGASTRODUODENOSCOPY (EGD) WITH PROPOFOL;  Surgeon: [name], MD;  Location: [facility] ENDOSCOPY;  Service: Endoscopy;  Laterality: N/A;   GIVENS CAPSULE STUDY N/A 09/26/2016   Procedure: GIVENS CAPSULE STUDY;  Surgeon: [name], MD;  Location: [facility] ENDOSCOPY;  Service: Endoscopy;  Laterality: N/A;   HERNIA REPAIR  2013   LUMBAR SPINE SURGERY      UPPER GI ENDOSCOPY  Sept 2015   Dr Rayann Heman     Home Medications:  Prior to Admission medications   Medication Sig Start Date End Date Taking? Authorizing Provider  amiodarone (PACERONE) 100 MG tablet Take 1 tablet (100 mg total) by mouth daily. 04/01/22  Yes [name], MD  fenofibrate micronized (ANTARA) 130 MG capsule TAKE 1 CAPSULE BY MOUTH  DAILY BEFORE BREAKFAST 03/22/22  Yes [name], MD  finasteride (PROSCAR) 5 MG tablet Take 1 tablet (5 mg total) by mouth daily. 11/20/21  Yes Stoioff, Ronda Fairly, MD  levobunolol (BETAGAN) 0.5 % ophthalmic solution Place 1 drop into the right eye 2 (two) times daily.   Yes [provider]  lisinopril (ZESTRIL) 10 MG tablet Take 1 tablet (10 mg total) by mouth daily. 04/01/22  Yes [name], MD  loratadine (CLARITIN) 10 MG tablet Take 1 tablet (10 mg total) by mouth daily. 08/30/20  Yes [name], MD  mirtazapine (REMERON) 15 MG tablet TAKE ONE TABLET EACH NIGHT 02/25/22  Yes [name], MD  Multiple Vitamin (MULTIVITAMIN PO) Take 1 tablet by mouth daily.   Yes [provider]  nitroGLYCERIN (NITROSTAT) 0.4 MG SL tablet DISSOLVE UNDER THE TONGUE 1 TABLET EVERY 5 MINUTES AS NEEDED FOR CHEST PAIN 10/01/21  Yes Amani Nodarse, Kathlene November, MD  pantoprazole (PROTONIX) 40 MG tablet TAKE ONE (1) TABLET BY MOUTH TWO TIMES PER DAY 04/23/22  Yes [name], MD  pravastatin (PRAVACHOL) 40 MG tablet Take 1 tablet (40 mg total) by mouth at bedtime. 10/22/21  Yes [name], MD  Rivaroxaban (XARELTO) 15 MG TABS tablet Take 1 tablet (15 mg total) by mouth daily. 04/01/22  Yes Christyan Reger, Kathlene November, MD  cyclobenzaprine (FLEXERIL) 5 MG tablet Take 5 mg by mouth 3 (three) times daily as needed. Patient not taking: Reported on 04/27/2022 03/14/22   [provider]  gabapentin (NEURONTIN) 100 MG capsule Take 100 mg by mouth daily. Patient not taking: Reported on 04/27/2022 04/04/22   [provider]  traMADol (ULTRAM) 50 MG  tablet Take 50 mg by mouth every 8 (eight) hours as needed. Patient not taking: Reported on 04/27/2022 03/25/22   [provider]    Inpatient Medications: Scheduled Meds:  amiodarone  100 mg Oral Daily   fenofibrate  160 mg Oral Daily   finasteride  5 mg Oral Daily   levobunolol  1 drop Right Eye BID   lisinopril  10 mg Oral Daily   mirtazapine  15 mg Oral QHS    morphine injection  1 mg Intravenous Once   pantoprazole  40 mg Oral Daily   pravastatin  40 mg Oral QHS   Continuous Infusions:  heparin 1,400 Units/hr (04/28/22 1204)   PRN Meds: acetaminophen **OR** acetaminophen, hydrALAZINE, loratadine, morphine injection, nitroGLYCERIN, nitroGLYCERIN, ondansetron **OR** ondansetron (ZOFRAN) IV, senna-docusate  Allergies:    Allergies  Allergen Reactions   Bee Venom Swelling   Sulfasalazine Hives and Swelling   Sulfa Antibiotics Hives and Swelling   Warfarin  Warfarin And Related Other (See Comments)    Chest pain    Social History:   Social History   Socioeconomic History   Marital status: Married    Spouse name: Not on file   Number of children: Not on file   Years of education: Not on file   Highest education level: Not on file  Occupational History   Occupation: retired   Tobacco Use   Smoking status: Never   Smokeless tobacco: Never  Vaping Use   Vaping Use: Never used  Substance and Sexual Activity   Alcohol use: No   Drug use: No   Sexual activity: Not Currently  Other Topics Concern   Not on file  Social History Narrative   Not on file   Social Determinants of Health   Financial Resource Strain: Low Risk  (03/02/2019)   Overall Financial Resource Strain (CARDIA)    Difficulty of Paying Living Expenses: Not hard at all  Food Insecurity: No Food Insecurity (03/02/2019)   Hunger Vital Sign    Worried About Running Out of Food in the Last Year: Never true    [place] in the Last Year: Never true  Transportation Needs: No Transportation  Needs (03/02/2019)   PRAPARE - Hydrologist (Medical): No    Lack of Transportation (Non-Medical): No  Physical Activity: Not on file  Stress: No Stress Concern Present (09/08/2019)   [place]    Feeling of Stress : Not at all  Social Connections: Unknown (09/08/2019)   Social Connection and Isolation Panel [NHANES]    Frequency of Communication with Friends and Family: More than three times a week    Frequency of Social Gatherings with Friends and Family: Once a week    Attends Religious Services: Not on Diplomatic Services operational officer of Clubs or Organizations: Yes    Attends Archivist Meetings: Not on file    Marital Status: Married  Intimate Partner Violence: Not At Risk (09/08/2019)   Humiliation, Afraid, Rape, and Kick questionnaire    Fear of Current or Ex-Partner: No    Emotionally Abused: No    Physically Abused: No    Sexually Abused: No    Family History:    Family History  Problem Relation Age of Onset   Stroke Mother    Colon cancer Father    Coronary artery disease Brother    Other Brother 49       lymes dz     ROS:  Please see the history of present illness.  Review of Systems  Constitutional: Negative.   HENT: Negative.    Respiratory: Negative.    Cardiovascular:  Positive for chest pain.  Gastrointestinal: Negative.   Musculoskeletal: Negative.   Neurological: Negative.   Psychiatric/Behavioral: Negative.    All other systems reviewed and are negative.   Physical Exam/Data:   Vitals:   04/28/22 0410 04/28/22 0829 04/28/22 0930 04/28/22 0947  BP: 120/86 (!) 183/109 (!) 120/53 (!) 107/57  Pulse: 82 79 90 87  Resp: 18 17    Temp: 97.9 F (36.6 C) 98 F (36.7 C)    TempSrc: Oral     SpO2: 96% 97% 98% 98%  Weight:        Intake/Output Summary (Last 24 hours) at 04/28/2022 1205 Last data filed at 04/28/2022 0800 Gross per 24 hour  Intake 146.7 ml   Output 1550 ml  Net -1403.3  ml      04/27/2022    2:00 PM 04/01/2022    1:58 PM 03/27/2022    4:06 PM  Last 3 Weights  Weight (lbs) 207 lb 3.7 oz 207 lb 4 oz 204 lb 3.2 oz  Weight (kg) 94 kg 94.008 kg 92.625 kg     Body mass index is 25.23 kg/m.  General:  Well nourished, well developed, in no acute distress HEENT: normal Neck: no JVD Vascular: No carotid bruits; Distal pulses 2+ bilaterally Cardiac:  normal S1, S2; RRR; no murmur  Lungs:  clear to auscultation bilaterally, no wheezing, rhonchi or rales  Abd: soft, nontender, no hepatomegaly  Ext: no edema Musculoskeletal:  No deformities, BUE and BLE strength normal and equal Skin: warm and dry  Neuro:  CNs 2-12 intact, no focal abnormalities noted Psych:  Normal affect   EKG:  The EKG was personally reviewed and demonstrates:   Normal sinus rhythm with no significant ST-T wave changes  Telemetry:  Telemetry was personally reviewed and demonstrates:   Normal sinus rhythm  Relevant CV Studies:   Laboratory Data:  High Sensitivity Troponin:   Recent Labs  Lab 04/27/22 1151 04/27/22 1323  TROPONINIHS 11 10     Chemistry Recent Labs  Lab 04/27/22 1151 04/28/22 0627  NA 141 142  K 4.2 3.9  CL 108 112*  CO2 24 24  GLUCOSE 125* 102*  BUN 25* 21  CREATININE 1.54* 1.34*  CALCIUM 9.5 9.1  GFRNONAA 43* 50*  ANIONGAP 9 6    No results for input(s): "PROT", "ALBUMIN", "AST", "ALT", "ALKPHOS", "BILITOT" in the last 168 hours. Lipids No results for input(s): "CHOL", "TRIG", "HDL", "LABVLDL", "LDLCALC", "CHOLHDL" in the last 168 hours.  Hematology Recent Labs  Lab 04/27/22 1151 04/28/22 0627  WBC 5.7 6.5  RBC 3.60* 2.87*  HGB 11.3* 10.5*  HCT 35.5* 29.2*  MCV 98.6 101.7*  MCH 31.4 36.6*  MCHC 31.8 36.0  RDW 14.6 16.0*  PLT 275 237   Thyroid No results for input(s): "TSH", "FREET4" in the last 168 hours.  BNPNo results for input(s): "BNP", "PROBNP" in the last 168 hours.  DDimer No results for input(s):  "DDIMER" in the last 168 hours.   Radiology/Studies:  DG Chest 2 View  Result Date: 04/27/2022 CLINICAL DATA:  Chest pain that started a few days ago EXAM: CHEST - 2 VIEW COMPARISON:  08/31/2021 FINDINGS: Normal heart size and mediastinal contours. Coronary atherosclerosis and stenting. There is no edema, consolidation, effusion, or pneumothorax. Prominent osteopenia. IMPRESSION: Stable from prior.  No acute finding. Electronically Signed   By: Jorje Guild M.D.   On: 04/27/2022 12:15     Assessment and Plan:   Unstable angina Known coronary artery disease, prior stenting to the LAD in 2005 Frequent episodes of chest pain dating back many years as outpatient, previously declined evaluation with catheterization but willing to proceed with catheterization this admission Given recurrence of his symptoms, severity of symptoms as demonstrated this morning, we will recommend cardiac catheterization I have reviewed the risks, indications, and alternatives to cardiac catheterization, possible angioplasty, and stenting with the patient. Risks include but are not limited to bleeding, infection, vascular injury, stroke, myocardial infection, arrhythmia, kidney injury, radiation-related injury in the case of prolonged fluoroscopy use, emergency cardiac surgery, and death. The patient understands the risks of serious complication is 1-2 in 8338 with diagnostic cardiac cath and 1-2% or less with angioplasty/stenting.  -Scheduled for catheterization tomorrow morning with Dr. Fletcher Anon We will continue heparin  infusion, orders placed Continue aspirin, pravastatin, fenofibrate  Proximal atrial fibrillation Maintaining normal sinus rhythm Continue amiodarone, anticoagulation, Not on beta-blocker secondary to hypotension, chronic orthostasis  Chronic back pain Reports followed by [facility] Declined cortisone, now walking with a walker    Total encounter time more than 80 minutes  Greater than 50% was  spent in counseling and coordination of care with the patient   For questions or updates, please contact [place] Please consult [email or web address] for contact info under    Signed, Ida Rogue, MD  04/28/2022 12:05 PM

## 2022-04-28 NOTE — Assessment & Plan Note (Signed)
Seems stable, creatinine around baseline.  Baseline seems to be around 1.2-1.3 -Monitor renal function -Avoid nephrotoxins

## 2022-04-29 ENCOUNTER — Encounter
Admission: EM | Disposition: A | Discharge status: Home / Self Care | Payer: Self-pay | Source: Home / Self Care | Attending: Internal Medicine

## 2022-04-29 ENCOUNTER — Inpatient Hospital Stay: Admit: 2022-04-29 | Payer: Medicare HMO

## 2022-04-29 ENCOUNTER — Encounter: Payer: Self-pay | Admitting: Cardiovascular Disease

## 2022-04-29 DIAGNOSIS — R079 Chest pain, unspecified: Secondary | ICD-10-CM

## 2022-04-29 DIAGNOSIS — I493 Ventricular premature depolarization: Secondary | ICD-10-CM

## 2022-04-29 DIAGNOSIS — R0789 Other chest pain: Secondary | ICD-10-CM | POA: Diagnosis not present

## 2022-04-29 DIAGNOSIS — I2510 Atherosclerotic heart disease of native coronary artery without angina pectoris: Secondary | ICD-10-CM

## 2022-04-29 HISTORY — PX: LEFT HEART CATH AND CORONARY ANGIOGRAPHY: CATH118249

## 2022-04-29 LAB — APTT: aPTT: 66 seconds — ABNORMAL HIGH (ref 24–36)

## 2022-04-29 LAB — HEPARIN LEVEL (UNFRACTIONATED): Heparin Unfractionated: 0.49 IU/mL (ref 0.30–0.70)

## 2022-04-29 SURGERY — LEFT HEART CATH AND CORONARY ANGIOGRAPHY
Anesthesia: Moderate Sedation

## 2022-04-29 MED ORDER — LIDOCAINE HCL (PF) 1 % IJ SOLN
INTRAMUSCULAR | Status: DC | PRN
  Administered 2022-04-29: 2 mL

## 2022-04-29 MED ORDER — HEPARIN (PORCINE) IN NACL 1000-0.9 UT/500ML-% IV SOLN
INTRAVENOUS | Status: AC
  Filled 2022-04-29: qty 1000

## 2022-04-29 MED ORDER — METOPROLOL SUCCINATE ER 25 MG PO TB24: 25.0000 mg | ORAL_TABLET | Freq: Every day | ORAL | 1 refills | Status: DC

## 2022-04-29 MED ORDER — LIDOCAINE HCL 1 % IJ SOLN
INTRAMUSCULAR | Status: AC
  Filled 2022-04-29: qty 20

## 2022-04-29 MED ORDER — ASPIRIN 81 MG PO CHEW
81.0000 mg | CHEWABLE_TABLET | ORAL | Status: AC
  Administered 2022-04-29: 81 mg via ORAL

## 2022-04-29 MED ORDER — MIDAZOLAM HCL 2 MG/2ML IJ SOLN
INTRAMUSCULAR | Status: DC | PRN
  Administered 2022-04-29: 1 mg via INTRAVENOUS

## 2022-04-29 MED ORDER — VERAPAMIL HCL 2.5 MG/ML IV SOLN
INTRAVENOUS | Status: DC | PRN
  Administered 2022-04-29: 2.5 mg via INTRA_ARTERIAL

## 2022-04-29 MED ORDER — ACETAMINOPHEN 325 MG PO TABS: 650.0000 mg | ORAL_TABLET | Freq: Four times a day (QID) | ORAL | Status: DC | PRN

## 2022-04-29 MED ORDER — SODIUM CHLORIDE 0.9 % IV SOLN: INTRAVENOUS | Status: AC

## 2022-04-29 MED ORDER — IOHEXOL 300 MG/ML  SOLN
INTRAMUSCULAR | Status: DC | PRN
  Administered 2022-04-29: 35 mL

## 2022-04-29 MED ORDER — VERAPAMIL HCL 2.5 MG/ML IV SOLN
INTRAVENOUS | Status: AC
  Filled 2022-04-29: qty 2

## 2022-04-29 MED ORDER — SODIUM CHLORIDE 0.9% FLUSH
3.0000 mL | Freq: Two times a day (BID) | INTRAVENOUS | Status: DC
  Administered 2022-04-29: 3 mL via INTRAVENOUS

## 2022-04-29 MED ORDER — HEPARIN (PORCINE) IN NACL 1000-0.9 UT/500ML-% IV SOLN
INTRAVENOUS | Status: DC | PRN
  Administered 2022-04-29 (×2): 500 mL

## 2022-04-29 MED ORDER — HEPARIN SODIUM (PORCINE) 1000 UNIT/ML IJ SOLN
INTRAMUSCULAR | Status: DC | PRN
  Administered 2022-04-29: 5000 [IU] via INTRAVENOUS

## 2022-04-29 MED ORDER — SODIUM CHLORIDE 0.9 % IV SOLN
250.0000 mL | INTRAVENOUS | Status: DC | PRN
Start: 2022-04-29 — End: 2022-04-29

## 2022-04-29 MED ORDER — FENTANYL CITRATE (PF) 100 MCG/2ML IJ SOLN
INTRAMUSCULAR | Status: AC
  Filled 2022-04-29: qty 2

## 2022-04-29 MED ORDER — SODIUM CHLORIDE 0.9% FLUSH: 3.0000 mL | INTRAVENOUS | Status: DC | PRN

## 2022-04-29 MED ORDER — ACETAMINOPHEN 650 MG RE SUPP: 650.0000 mg | Freq: Four times a day (QID) | RECTAL | Status: DC | PRN

## 2022-04-29 MED ORDER — RIVAROXABAN 15 MG PO TABS
15.0000 mg | ORAL_TABLET | Freq: Every day | ORAL | Status: DC
  Administered 2022-04-29: 15 mg via ORAL
  Filled 2022-04-29: qty 1

## 2022-04-29 MED ORDER — ASPIRIN 81 MG PO CHEW
CHEWABLE_TABLET | ORAL | Status: AC
  Filled 2022-04-29: qty 1

## 2022-04-29 MED ORDER — FENTANYL CITRATE (PF) 100 MCG/2ML IJ SOLN
INTRAMUSCULAR | Status: DC | PRN
  Administered 2022-04-29: 25 ug via INTRAVENOUS

## 2022-04-29 MED ORDER — SODIUM CHLORIDE 0.9 % WEIGHT BASED INFUSION
3.0000 mL/kg/h | INTRAVENOUS | Status: DC
  Administered 2022-04-29: 3 mL/kg/h via INTRAVENOUS

## 2022-04-29 MED ORDER — MIDAZOLAM HCL 2 MG/2ML IJ SOLN
INTRAMUSCULAR | Status: AC
  Filled 2022-04-29: qty 2

## 2022-04-29 MED ORDER — METOPROLOL SUCCINATE ER 25 MG PO TB24
25.0000 mg | ORAL_TABLET | Freq: Every day | ORAL | Status: DC
  Administered 2022-04-29: 25 mg via ORAL
  Filled 2022-04-29: qty 1

## 2022-04-29 MED ORDER — SODIUM CHLORIDE 0.9 % WEIGHT BASED INFUSION: 1.0000 mL/kg/h | INTRAVENOUS | Status: DC

## 2022-04-29 MED ORDER — HEPARIN SODIUM (PORCINE) 1000 UNIT/ML IJ SOLN
INTRAMUSCULAR | Status: AC
  Filled 2022-04-29: qty 10

## 2022-04-29 SURGICAL SUPPLY — 11 items
BAND ZEPHYR COMPRESS 30 LONG (HEMOSTASIS) IMPLANT
CATH INFINITI 5FR JK (CATHETERS) IMPLANT
CATH JL3.5 FR DIAG (CATHETERS) IMPLANT
DRAPE BRACHIAL (DRAPES) IMPLANT
GLIDESHEATH SLEND SS 6F .021 (SHEATH) IMPLANT
GUIDEWIRE INQWIRE 1.5J.035X260 (WIRE) IMPLANT
INQWIRE 1.5J .035X260CM (WIRE) ×1
PACK CARDIAC CATH (CUSTOM PROCEDURE TRAY) ×1 IMPLANT
PROTECTION STATION PRESSURIZED (MISCELLANEOUS) ×1
SET ATX SIMPLICITY (MISCELLANEOUS) IMPLANT
STATION PROTECTION PRESSURIZED (MISCELLANEOUS) IMPLANT

## 2022-04-29 NOTE — Interval H&P Note (Signed)
History and Physical Interval Note:  04/29/2022 8:54 AM  William Taylor  has presented today for surgery, with the diagnosis of Unstable angina.  The various methods of treatment have been discussed with the patient and family. After consideration of risks, benefits and other options for treatment, the patient has consented to  Procedure(s): LEFT HEART CATH AND CORONARY ANGIOGRAPHY (N/A) as a surgical intervention.  The patient's history has been reviewed, patient examined, no change in status, stable for surgery.  I have reviewed the patient's chart and labs.  Questions were answered to the patient's satisfaction.     Kathlyn Sacramento

## 2022-04-29 NOTE — Progress Notes (Signed)
Rounding Note    Patient Name: William Taylor Date of Encounter: 04/29/2022  [facility] Cardiologist: Dr. Rockey Situ  Subjective   No chest pain or shortness of breath.  Cardiac catheterization done earlier today via the right radial artery showed patent LAD and OM 3 stents with no obstructive disease.  He was noted to be in ventricular bigeminy.  I started small dose Toprol.  Inpatient Medications    Scheduled Meds:  amiodarone  100 mg Oral Daily   fenofibrate  160 mg Oral Daily   finasteride  5 mg Oral Daily   levobunolol  1 drop Right Eye BID   lisinopril  10 mg Oral Daily   metoprolol succinate  25 mg Oral Daily   mirtazapine  15 mg Oral QHS    morphine injection  1 mg Intravenous Once   pantoprazole  40 mg Oral Daily   pravastatin  40 mg Oral QHS   rivaroxaban  15 mg Oral Q supper   sodium chloride flush  3 mL Intravenous Q12H   Continuous Infusions:  sodium chloride     PRN Meds: sodium chloride, acetaminophen **OR** acetaminophen, hydrALAZINE, loratadine, nitroGLYCERIN, nitroGLYCERIN, ondansetron **OR** ondansetron (ZOFRAN) IV, senna-docusate, sodium chloride flush   Vital Signs    Vitals:   04/29/22 1100 04/29/22 1214 04/29/22 1425 04/29/22 1433  BP: (!) 159/88 (!) 118/55 137/66   Pulse: (!) 46  (!) 44 98  Resp: '15 18 18   '$ Temp:  98.5 F (36.9 C) 98.7 F (37.1 C)   TempSrc:  Oral Oral   SpO2: 96% 100% 98%   Weight:      Height:        Intake/Output Summary (Last 24 hours) at 04/29/2022 1530 Last data filed at 04/29/2022 1200 Gross per 24 hour  Intake 700 ml  Output 600 ml  Net 100 ml      04/29/2022    8:25 AM 04/29/2022    2:21 AM 04/27/2022    2:00 PM  Last 3 Weights  Weight (lbs) 209 lb 14.1 oz 209 lb 14.1 oz 207 lb 3.7 oz  Weight (kg) 95.2 kg 95.2 kg 94 kg      Telemetry    Sinus rhythm with frequent PVCs and ventricular bigeminy- Personally Reviewed  ECG     - Personally Reviewed  Physical Exam   GEN: No acute distress.    Neck: No JVD Cardiac: RRR, no murmurs, rubs, or gallops.  Respiratory: Clear to auscultation bilaterally. GI: Soft, nontender, non-distended  MS: No edema; No deformity. Neuro:  Nonfocal  Psych: Normal affect  Right radial pulse: normal with no hematoma.  Labs    High Sensitivity Troponin:   Recent Labs  Lab 04/27/22 1151 04/27/22 1323  TROPONINIHS 11 10     Chemistry Recent Labs  Lab 04/27/22 1151 04/28/22 0627  NA 141 142  K 4.2 3.9  CL 108 112*  CO2 24 24  GLUCOSE 125* 102*  BUN 25* 21  CREATININE 1.54* 1.34*  CALCIUM 9.5 9.1  GFRNONAA 43* 50*  ANIONGAP 9 6    Lipids No results for input(s): "CHOL", "TRIG", "HDL", "LABVLDL", "LDLCALC", "CHOLHDL" in the last 168 hours.  Hematology Recent Labs  Lab 04/27/22 1151 04/28/22 0627  WBC 5.7 6.5  RBC 3.60* 2.87*  3.36*  HGB 11.3* 10.5*  HCT 35.5* 29.2*  MCV 98.6 101.7*  MCH 31.4 36.6*  MCHC 31.8 36.0  RDW 14.6 16.0*  PLT 275 237   Thyroid No results for  input(s): "TSH", "FREET4" in the last 168 hours.  BNPNo results for input(s): "BNP", "PROBNP" in the last 168 hours.  DDimer No results for input(s): "DDIMER" in the last 168 hours.   Radiology    CARDIAC CATHETERIZATION  Result Date: 04/29/2022   Prox RCA lesion is 40% stenosed.   Mid RCA to Dist RCA lesion is 30% stenosed.   3rd Mrg lesion is 10% stenosed.   Dist LAD lesion is 60% stenosed.   Mid LAD lesion is 20% stenosed.   Mid LAD to Dist LAD lesion is 20% stenosed.   Prox LAD to Mid LAD lesion is 20% stenosed. 1.  Widely patent LAD and OM3 stents with minimal restenosis.  Moderately calcified coronary arteries with mild to moderate nonobstructive coronary artery disease. 2.  Left ventricular angiography was not performed due to chronic kidney disease.  LVEDP was mildly elevated. Recommendations: Recommend continuing medical therapy. Xarelto can be resumed this evening. The patient's reported episodes of chest pain were in the setting of palpitations.  He is  noted to be in ventricular bigeminy during cardiac catheterization.  I added small dose of Toprol to see if he can tolerate.  He had issues with orthostatic hypotension in the past. The patient can likely be discharged home in the afternoon if he remains stable.    Cardiac Studies     Patient Profile     88 y.o. male with history of coronary artery disease status post LAD and OM 3 stents, paroxysmal atrial fibrillation, chronic chest pain, previous orthostatic hypotension and hyperlipidemia who presented with chest pain.  Troponin negative.  Assessment & Plan    1.  Chest pain: Likely noncardiac as cardiac catheterization today showed no evidence of obstructive coronary artery disease.  Recommend continuing medical therapy.  2.  Paroxysmal atrial fibrillation: Maintaining sinus rhythm with amiodarone which should be continued.  Xarelto can be resumed later this evening.  3.  Frequent PVCs and ventricular bigeminy: I added small dose Toprol.  4.  History of gallstones: He reports that the chest pain happened after he ate.  Consider imaging with ultrasound.      For questions or updates, please contact [place] Please consult [email or web address] for contact info under        Signed, Kathlyn Sacramento, MD  04/29/2022, 3:30 PM

## 2022-04-29 NOTE — Progress Notes (Signed)
Patient cardiac rhythm noted to be in ventricular bigeminy. Patient denies any chest pain or pressure. Dr. Fletcher Anon made aware plan to continue with current treatment.

## 2022-04-29 NOTE — Progress Notes (Signed)
  Transition of Care [facility]) Screening Note   Patient Details  Name: MCKINNON GLICK Date of Birth: 06/13/1930   Transition of Care [facility]) CM/SW Contact:    Alberteen Sam, LCSW Phone Number: 04/29/2022, 4:13 PM    Transition of Care Department [facility]) has reviewed patient and no TOC needs have been identified at this time. We will continue to monitor patient advancement through interdisciplinary progression rounds. If new patient transition needs arise, please place a TOC consult.  [place], [place]

## 2022-04-29 NOTE — Plan of Care (Signed)
  Problem: Education: Goal: Understanding of CV disease, CV risk reduction, and recovery process will improve 04/29/2022 0232 by Janeese Mcgloin, RN Outcome: Progressing 04/29/2022 0231 by Kaylany Tesoriero, RN Outcome: Progressing Goal: Individualized Educational Video(s) 04/29/2022 0232 by Tenaya Hilyer, RN Outcome: Progressing 04/29/2022 0231 by Marcille Barman, RN Outcome: Progressing   Problem: Activity: Goal: Ability to return to baseline activity level will improve 04/29/2022 0232 by Sewell Pitner, RN Outcome: Progressing 04/29/2022 0231 by Tyona Nilsen, RN Outcome: Progressing   Problem: Cardiovascular: Goal: Ability to achieve and maintain adequate cardiovascular perfusion will improve 04/29/2022 0232 by Janye Maynor, RN Outcome: Progressing 04/29/2022 0231 by Cadyn Fann, RN Outcome: Progressing Goal: Vascular access site(s) Level 0-1 will be maintained 04/29/2022 0232 by Marien Manship, RN Outcome: Progressing 04/29/2022 0231 by Caylen Kuwahara, RN Outcome: Progressing   Problem: Health Behavior/Discharge Planning: Goal: Ability to safely manage health-related needs after discharge will improve 04/29/2022 0232 by Jaquaveon Bilal, RN Outcome: Progressing 04/29/2022 0231 by Brighton Delio, RN Outcome: Progressing   Problem: Education: Goal: Understanding of CV disease, CV risk reduction, and recovery process will improve 04/29/2022 0232 by Haliegh Khurana, RN Outcome: Progressing 04/29/2022 0231 by Kamorah Nevils, RN Outcome: Progressing Goal: Individualized Educational Video(s) 04/29/2022 0232 by Lakedra Washington, RN Outcome: Progressing 04/29/2022 0231 by Tadd Holtmeyer, RN Outcome: Progressing   Problem: Activity: Goal: Ability to return to baseline activity level will improve 04/29/2022 0232 by Chandrea Zellman, RN Outcome: Progressing 04/29/2022 0231 by Namira Rosekrans, RN Outcome: Progressing   Problem: Cardiovascular: Goal: Ability to achieve and maintain adequate cardiovascular  perfusion will improve 04/29/2022 0232 by Gessica Jawad, RN Outcome: Progressing 04/29/2022 0231 by Viveca Beckstrom, RN Outcome: Progressing Goal: Vascular access site(s) Level 0-1 will be maintained 04/29/2022 0232 by Mashonda Broski, RN Outcome: Progressing 04/29/2022 0231 by Kensley Valladares, RN Outcome: Progressing   Problem: Health Behavior/Discharge Planning: Goal: Ability to safely manage health-related needs after discharge will improve Outcome: Progressing

## 2022-04-29 NOTE — Discharge Summary (Signed)
Physician Discharge Summary   Patient: William Taylor MRN: [id number] DOB: 1930/01/12  Admit date:     04/27/2022  Discharge date: 04/29/22  Discharge Physician: Lorella Nimrod   PCP: [name], MD   Recommendations at discharge:  Please obtain CBC and BMP in 1 week Follow-up with primary care provider within a week Follow-up with your cardiologist  Discharge Diagnoses: Principal Problem:   Chest pressure Active Problems:   Chest pain with high risk for cardiac etiology   Coronary artery disease   Paroxysmal atrial fibrillation ([place])   GERD (gastroesophageal reflux disease)   Hypertension   History of pulmonary embolism   CKD (chronic kidney disease), stage IIIa   Mixed hyperlipidemia   Iron deficiency anemia   Hospital Course: [name] is a 88 year old male with history of CAD, hypertension, hyperlipidemia, GERD, depression, CKD 3A, history of PE on Xarelto, who presents emergency department for chief concerns of chest pain/chest pressure with associated shortness of breath for the past couple of days.  Initial vitals in the emergency department showed temperature of 97.8, respiration rate of 17, heart rate of 48, blood pressure 132/67, SPO2 of 98% on room air.  Serum sodium is 141, potassium 4.2, chloride 108, bicarb 24, BUN of 25, serum creatinine 1.54, GFR 43, nonfasting blood glucose 125, WBC 5.7, hemoglobin 11.3, platelets of 275.  Patient reports he last took his Xarelto dose with dinner on 04/26/2022.  ED treatment: Aspirin 324 mg p.o. one-time dose. Nitroglycerin ointment.  9/10: Blood pressure elevated at 183/109 requiring as needed hydralazine.  Troponin remain negative.  EKG without any significant abnormality.  Patient developed another episode of chest pain after eating breakfast, denies any GERD symptoms. Discussed with cardiology, apparently patient is having these episodic chest pain for a while and in the past refused cardiac catheterization  to rule out unstable angina. He agrees to proceed with cardiac catheterization tomorrow. Xarelto will remain on hold and he will continue with heparin for concern of unstable angina until ruled out by cath.  9/11: Patient underwent cardiac catheterization which shows no significant stenosis.  Patent prior stents.  Noted to have some PVC and bigeminy during procedure so he was started on low-dose metoprolol. Patient need to follow-up with PCP and continue on Protonix as GERD might be responsible for his symptoms.  He will continue on current medications and need to have a close follow-up with his providers for further recommendations.  Assessment and Plan: * Chest pressure Patient is experiencing intermittent chest pain/pressure for a while and has refused further investigation with his cardiologist. Troponin remained negative and EKG without any significant changes.  Patient do have an history of CAD. Now agrees to proceed with cardiac cath to determine the cause of these intermittent chest pressures/pain. -Continue with heparin infusion today -Cardiac catheterization tomorrow -Continue with as needed morphine and nitroglycerin.  Coronary artery disease S/p stent placement in 2005 and then in 2012. -See above for management  Paroxysmal atrial fibrillation ([place]) - Patient is on Xarelto which will be held on admission - Amiodarone 100 mg daily resumed -Heparin infusion  GERD (gastroesophageal reflux disease) Can be contributory to his symptoms as he developed chest pain today after eating breakfast.  But no other definitive relationship with food. -Continue with PPI  Hypertension Blood pressure elevated this morning, can be secondary to pain.  Received as needed hydralazine. - Lisinopril 10 mg daily resumed - Hydralazine 5 mg IV every 6 hours as needed for SBP greater than  180, 4 days ordered  History of pulmonary embolism History of PE after prior cardiac catheterization Home  Xarelto is on hold for catheter tomorrow. -Continue with heparin infusion today  CKD (chronic kidney disease), stage IIIa Seems stable, creatinine around baseline.  Baseline seems to be around 1.2-1.3 -Monitor renal function -Avoid nephrotoxins  Mixed hyperlipidemia - Pravastatin 40 mg nightly and fenofibrate 150 mg daily resumed  Iron deficiency anemia History of iron deficiency anemia.  Hemoglobin seems stable.  Not on any supplement at home. -Check anemia panel   Consultants: Cardiology Procedures performed: Cardiac catheterization Disposition: Home Diet recommendation:  Discharge Diet Orders (From admission, onward)     Start     Ordered   04/29/22 0000  Diet - low sodium heart healthy        04/29/22 1519           Cardiac diet DISCHARGE MEDICATION: Allergies as of 04/29/2022       Reactions   Bee Venom Swelling   Sulfasalazine Hives, Swelling   Sulfa Antibiotics Hives, Swelling   Warfarin    Warfarin And Related Other (See Comments)   Chest pain        Medication List     STOP taking these medications    cyclobenzaprine 5 MG tablet Commonly known as: FLEXERIL   gabapentin 100 MG capsule Commonly known as: NEURONTIN   traMADol 50 MG tablet Commonly known as: ULTRAM       TAKE these medications    amiodarone 100 MG tablet Commonly known as: PACERONE Take 1 tablet (100 mg total) by mouth daily.   fenofibrate micronized 130 MG capsule Commonly known as: ANTARA TAKE 1 CAPSULE BY MOUTH  DAILY BEFORE BREAKFAST   finasteride 5 MG tablet Commonly known as: PROSCAR Take 1 tablet (5 mg total) by mouth daily.   levobunolol 0.5 % ophthalmic solution Commonly known as: BETAGAN Place 1 drop into the right eye 2 (two) times daily.   lisinopril 10 MG tablet Commonly known as: ZESTRIL Take 1 tablet (10 mg total) by mouth daily.   loratadine 10 MG tablet Commonly known as: CLARITIN Take 1 tablet (10 mg total) by mouth daily.   metoprolol  succinate 25 MG 24 hr tablet Commonly known as: TOPROL-XL Take 1 tablet (25 mg total) by mouth daily. Start taking on: April 30, 2022   mirtazapine 15 MG tablet Commonly known as: REMERON TAKE ONE TABLET EACH NIGHT   MULTIVITAMIN PO Take 1 tablet by mouth daily.   nitroGLYCERIN 0.4 MG SL tablet Commonly known as: NITROSTAT DISSOLVE UNDER THE TONGUE 1 TABLET EVERY 5 MINUTES AS NEEDED FOR CHEST PAIN   pantoprazole 40 MG tablet Commonly known as: PROTONIX TAKE ONE (1) TABLET BY MOUTH TWO TIMES PER DAY   pravastatin 40 MG tablet Commonly known as: PRAVACHOL Take 1 tablet (40 mg total) by mouth at bedtime.   Rivaroxaban 15 MG Tabs tablet Commonly known as: Xarelto Take 1 tablet (15 mg total) by mouth daily.        Follow-up Information     [name], MD. Schedule an appointment as soon as possible for a visit.   Specialty: Family Medicine Contact information: [address] [place] [address] [phone]         [name], MD. Schedule an appointment as soon as possible for a visit in 1 week(s).   Specialty: Cardiology Contact information: [address] [place] [address] [phone]  Discharge Exam: Filed Weights   04/27/22 1400 04/29/22 0221 04/29/22 0825  Weight: 94 kg 95.2 kg 95.2 kg   General.  Well-developed elderly man, in no acute distress. Pulmonary.  Lungs clear bilaterally, normal respiratory effort. CV.  Regular rate and rhythm, no JVD, rub or murmur. Abdomen.  Soft, nontender, nondistended, BS positive. CNS.  Alert and oriented .  No focal neurologic deficit. Extremities.  No edema, no cyanosis, pulses intact and symmetrical. Psychiatry.  Judgment and insight appears normal.   Condition at discharge: stable  The results of significant diagnostics from this hospitalization (including imaging, microbiology, ancillary and laboratory) are listed below for reference.    Imaging Studies: CARDIAC CATHETERIZATION  Result Date: 04/29/2022   Prox RCA lesion is 40% stenosed.   Mid RCA to Dist RCA lesion is 30% stenosed.   3rd Mrg lesion is 10% stenosed.   Dist LAD lesion is 60% stenosed.   Mid LAD lesion is 20% stenosed.   Mid LAD to Dist LAD lesion is 20% stenosed.   Prox LAD to Mid LAD lesion is 20% stenosed. 1.  Widely patent LAD and OM3 stents with minimal restenosis.  Moderately calcified coronary arteries with mild to moderate nonobstructive coronary artery disease. 2.  Left ventricular angiography was not performed due to chronic kidney disease.  LVEDP was mildly elevated. Recommendations: Recommend continuing medical therapy. Xarelto can be resumed this evening. The patient's reported episodes of chest pain were in the setting of palpitations.  He is noted to be in ventricular bigeminy during cardiac catheterization.  I added small dose of Toprol to see if he can tolerate.  He had issues with orthostatic hypotension in the past. The patient can likely be discharged home in the afternoon if he remains stable.   DG Chest 2 View  Result Date: 04/27/2022 CLINICAL DATA:  Chest pain that started a few days ago EXAM: CHEST - 2 VIEW COMPARISON:  08/31/2021 FINDINGS: Normal heart size and mediastinal contours. Coronary atherosclerosis and stenting. There is no edema, consolidation, effusion, or pneumothorax. Prominent osteopenia. IMPRESSION: Stable from prior.  No acute finding. Electronically Signed   By: Jorje Guild M.D.   On: 04/27/2022 12:15    Microbiology: Results for orders placed or performed during the hospital encounter of 04/27/22  SARS Coronavirus 2 by RT PCR (hospital order, performed in [facility] hospital lab) *cepheid single result test* Anterior Nasal Swab     Status: None   Collection Time: 04/27/22  2:25 PM   Specimen: Anterior Nasal Swab  Result Value Ref Range Status   SARS Coronavirus 2 by RT PCR NEGATIVE NEGATIVE Final    Comment:  (NOTE) SARS-CoV-2 target nucleic acids are NOT DETECTED.  The SARS-CoV-2 RNA is generally detectable in upper and lower respiratory specimens during the acute phase of infection. The lowest concentration of SARS-CoV-2 viral copies this assay can detect is 250 copies / mL. A negative result does not preclude SARS-CoV-2 infection and should not be used as the sole basis for treatment or other patient management decisions.  A negative result may occur with improper specimen collection / handling, submission of specimen other than nasopharyngeal swab, presence of viral mutation(s) within the areas targeted by this assay, and inadequate number of viral copies (<250 copies / mL). A negative result must be combined with clinical observations, patient history, and epidemiological information.  Fact Sheet for Patients:   https://www.patel.info/  Fact Sheet for Healthcare Providers: https://hall.com/  This test is not yet approved or  cleared  by the [place] and has been authorized for detection and/or diagnosis of SARS-CoV-2 by FDA under an Emergency Use Authorization (EUA).  This EUA will remain in effect (meaning this test can be used) for the duration of the COVID-19 declaration under Section 564(b)(1) of the Act, 21 U.S.C. section 360bbb-3(b)(1), unless the authorization is terminated or revoked sooner.  Performed at [facility], [address] [place], [place] [address]     Labs: CBC: Recent Labs  Lab 04/27/22 1151 04/28/22 0627  WBC 5.7 6.5  HGB 11.3* 10.5*  HCT 35.5* 29.2*  MCV 98.6 101.7*  PLT 275 771   Basic Metabolic Panel: Recent Labs  Lab 04/27/22 1151 04/28/22 0627  NA 141 142  K 4.2 3.9  CL 108 112*  CO2 24 24  GLUCOSE 125* 102*  BUN 25* 21  CREATININE 1.54* 1.34*  CALCIUM 9.5 9.1   Liver Function Tests: No results for input(s): "AST", "ALT", "ALKPHOS", "BILITOT", "PROT", "ALBUMIN" in the  last 168 hours. CBG: No results for input(s): "GLUCAP" in the last 168 hours.  Discharge time spent: greater than 30 minutes.  This record has been created using Systems analyst. Errors have been sought and corrected,but may not always be located. Such creation errors do not reflect on the standard of care.   Signed: Lorella Nimrod, MD [facility] 04/29/2022

## 2022-04-29 NOTE — Progress Notes (Signed)
Patient's heparin rate/dose changed to 15 ml/hr

## 2022-04-29 NOTE — Consult Note (Signed)
ANTICOAGULATION CONSULT NOTE  Pharmacy Consult for heparin infusion Indication: chest pain/ACS  Allergies  Allergen Reactions   Bee Venom Swelling   Sulfasalazine Hives and Swelling   Sulfa Antibiotics Hives and Swelling   Warfarin    Warfarin And Related Other (See Comments)    Chest pain    Patient Measurements: Weight: 94 kg (207 lb 3.7 oz) (per 8/14 office visit) Heparin Dosing Weight: 94 kg  Vital Signs: Temp: 98 F (36.7 C) (09/11 0419) BP: 115/66 (09/11 0419) Pulse Rate: 47 (09/11 0419)  Labs: Recent Labs    04/27/22 1151 04/27/22 1323 04/27/22 1500 04/27/22 2314 04/28/22 0627 04/28/22 0628 04/28/22 1436 04/29/22 0443  HGB 11.3*  --   --   --  10.5*  --   --   --   HCT 35.5*  --   --   --  29.2*  --   --   --   PLT 275  --   --   --  237  --   --   --   APTT  --   --  30   < >  --  80* 73* 66*  LABPROT  --   --  21.3*  --   --   --   --   --   INR  --   --  1.9*  --   --   --   --   --   HEPARINUNFRC  --   --  >1.10*  --   --  0.84*  --  0.49  CREATININE 1.54*  --   --   --  1.34*  --   --   --   TROPONINIHS 11 10  --   --   --   --   --   --    < > = values in this interval not displayed.     Estimated Creatinine Clearance: 45 mL/min (A) (by C-G formula based on SCr of 1.34 mg/dL (H)).   Medical History: Past Medical History:  Diagnosis Date   Cancer [facility])    Prostate, followed by Dr. Jacqlyn Larsen   Colon polyp    Coronary artery disease    a. 2005 s/p PCI LAD ([facility]); b 2012 s/p PCI OM2; c.10/2013 Cath: LAD 49mISR, 469mD1 90, D2 70, OM2 80 w/ patent stent;  d. 06/2019 Low risk MV; e. 07/2020 MV: EF 77%, no ischemia/infarct.   Gall stones    Hyperlipidemia    Hypertension    MI ([facility]ocardial infarction) ([facility]   2015   Orthostatic hypotension    a. prev on midodrine/florinef/northera.   PAF (paroxysmal atrial fibrillation) ([facility])    a. on xarelto and amio (CHA2DS2VASc = 4).   Skin cancer     Medications:  Scheduled:   amiodarone  100 mg Oral  Daily   [START ON 04/30/2022] aspirin  81 mg Oral Pre-Cath   fenofibrate  160 mg Oral Daily   finasteride  5 mg Oral Daily   levobunolol  1 drop Right Eye BID   lisinopril  10 mg Oral Daily   mirtazapine  15 mg Oral QHS    morphine injection  1 mg Intravenous Once   pantoprazole  40 mg Oral Daily   pravastatin  40 mg Oral QHS   Infusions:   [START ON 04/30/2022] sodium chloride     Followed by   [SDerrill MemoN 04/30/2022] sodium chloride     heparin 1,400 Units/hr (04/28/22 1204)  PRN: acetaminophen **OR** acetaminophen, hydrALAZINE, loratadine, morphine injection, nitroGLYCERIN, nitroGLYCERIN, ondansetron **OR** ondansetron (ZOFRAN) IV, senna-docusate  Assessment: William Taylor is a 88 y.o. male presenting with chest tightness x4 days. Patient was on Xarelto for Afib with last dose reported to be on 9/8. PMH significant for CAD (s/p stenting), Afib with RVR, hx of PE. Pharmacy has been consulted to initiate and manage heparin infusion.  Baseline Labs: aPTT 30, HL >1.10, PT 21.3, INR 1.9, Hgb 11.3, Hct 35.5, Plt 275   Goal of Therapy:  Heparin level 0.3-0.7 units/ml aPTT 66-102 seconds Monitor platelets by anticoagulation protocol: Yes   9/09 2314 aPTT 55 subtherapeutic 9/10 0628 aPTT 80, thera x 1, HL 0.84, suprathera 9/10 1436 aPTT 73, thera x 2 9/11 0443 aPTT 66 slightly therapeutic, HL thera x 1  Plan:  Per MD request, no bolus will be given Increase heparin infusion to 1500 units/hr Check aPTT in 8 hrs after rate change Monitor HL & aPTT daily for correlation then switch to HL monitoring once correlated  Continue to monitor H&H and platelets daily while on heparin   Thank you for allowing pharmacy to be a part of this patient's care.  Renda Rolls, PharmD, [facility] 04/29/2022 5:57 AM

## 2022-04-29 NOTE — Progress Notes (Signed)
Patient has been NPO since midnight and informed consent signed

## 2022-04-30 ENCOUNTER — Telehealth: Payer: Self-pay

## 2022-04-30 ENCOUNTER — Telehealth: Payer: Self-pay | Admitting: Cardiovascular Disease

## 2022-04-30 NOTE — Telephone Encounter (Signed)
Transition Care Management Follow-up Telephone Call Date of discharge and from where: 04/29/22 [facility] How have you been since you were released from the hospital? Patient and wife note he is not feeling too good. Metoprolol '25mg'$  given by hospital at discharge yesterday and BP dropped to  73/41 (standing) while home. Rested through the night but had some light headedness.  Home BP reading today at 3:00pm 102/57 43P sitting, 98/53 44P standing. Holding Metoprolol and calling Cardiology at close of this call for follow up regarding this topic.  Encouraged to call this office back or go to ED for care after hours as needed. Denies chest pain/pressure, dizziness, falls, headache, confusion, N/V/D and all other harmful symptoms.  Trying to stay hydrated. Eating well.  Any questions or concerns? None additional to address at this time.   Items Reviewed: Did the pt receive and understand the discharge instructions provided? Yes  Medications obtained and verified? Yes , wife assist Any new allergies since your discharge? No  Dietary orders reviewed? Yes Do you have support at home? Yes   Home Care and Equipment/Supplies: Were home health services ordered? No Functional Questionnaire: (I = Independent and D = Dependent) ADLs: Wife assist as needed  Bathing/Dressing- I  Eating- I  Maintaining continence- I  Transferring/Ambulation- Cane/walker in use  Managing Meds- Wife assist  Follow up appointments reviewed:  PCP Hospital f/u appt confirmed? Yes  Scheduled to see PCP on 05/07/22 @ 11:00. Labs previously scheduled on 9/25. Wait to cancel and complete on day of visit.  [facility] f/u appt confirmed? Yes . Calling today to make aware of BP changes and schedule follow up. Phone number offered, declined. Reports having phone number.  Are transportation arrangements needed? No  If their condition worsens, is the pt aware to call PCP or go to the Emergency Dept.? Yes Was the patient  provided with contact information for the PCP's office or ED? Yes Was to pt encouraged to call back with questions or concerns? Yes

## 2022-04-30 NOTE — Telephone Encounter (Signed)
Pt c/o BP issue: STAT if pt c/o blurred vision, one-sided weakness or slurred speech  1. What are your last 5 BP readings? Sitting 102/57 HR 43 standing 73/41 Sitting 102/57 HR 44  standing 98/53    2. Are you having any other symptoms (ex. Dizziness, headache, blurred vision, passed out)? Headache   3. What is your BP issue? Pt was given metoprolol in hospital and she believes this may be the cause and hasn't taken since hosp.

## 2022-04-30 NOTE — Telephone Encounter (Signed)
Spoke with patient and he was in the hospital. Yesterday before he was discharged they started him on Toprol XL 25 mg once daily. Today his blood pressures have been low. The lowest was 73/41. Advised I would review with Dr. Rockey Situ and call him back with recommendations.

## 2022-04-30 NOTE — Telephone Encounter (Signed)
Verbal discussion with Dr. Rockey Situ and recommendations were to discontinue Toprol at this time.   Spoke with patient and reviewed provider recommendations. He mentioned before being discharged he did have one elevated blood pressure. Instructed him to monitor his blood pressures and give [place] a call back if they should become elevated. He verbalized understanding with no further questions at this time.

## 2022-05-01 NOTE — Telephone Encounter (Signed)
Reviewed. Patient spoke with cardiology yesterday and they advised him to stop the metoprolol.

## 2022-05-07 ENCOUNTER — Encounter: Payer: Self-pay | Admitting: Family Medicine

## 2022-05-07 ENCOUNTER — Ambulatory Visit: Payer: Medicare HMO | Admitting: Family Medicine

## 2022-05-07 VITALS — BP 130/70 | HR 46 | Temp 97.8°F | Ht 76.0 in | Wt 208.8 lb

## 2022-05-07 DIAGNOSIS — K219 Gastro-esophageal reflux disease without esophagitis: Secondary | ICD-10-CM | POA: Diagnosis not present

## 2022-05-07 DIAGNOSIS — Z23 Encounter for immunization: Secondary | ICD-10-CM

## 2022-05-07 DIAGNOSIS — R0789 Other chest pain: Secondary | ICD-10-CM | POA: Diagnosis not present

## 2022-05-07 DIAGNOSIS — I493 Ventricular premature depolarization: Secondary | ICD-10-CM

## 2022-05-07 DIAGNOSIS — G8929 Other chronic pain: Secondary | ICD-10-CM

## 2022-05-07 DIAGNOSIS — I499 Cardiac arrhythmia, unspecified: Secondary | ICD-10-CM | POA: Diagnosis not present

## 2022-05-07 DIAGNOSIS — M549 Dorsalgia, unspecified: Secondary | ICD-10-CM

## 2022-05-07 NOTE — Patient Instructions (Signed)
Nice to see you. I will refer you back to Dr. [name] for evaluation of your reflux and chest pain.

## 2022-05-07 NOTE — Assessment & Plan Note (Signed)
Asymptomatic.  These likely account for his slow heart rate on pulse oximetry as that is likely not picking up all of his PVCs.  Heart rate on EKG acceptable.  He will follow-up with cardiology.

## 2022-05-07 NOTE — Assessment & Plan Note (Signed)
Unlikely to be cardiac in cause given his reassuring cardiac catheterization.  Certainly could be a GI cause such as GERD or esophageal spasms.  Recent chest x-ray did not reveal a cause.  We will refer back to GI to evaluate for possible GI cause.

## 2022-05-07 NOTE — Assessment & Plan Note (Signed)
Chronic issue.  Has been worked up by orthopedics and found to have arthritis.  For now he will monitor as he has declined back injections.  He will see orthopedics as planned.

## 2022-05-07 NOTE — Progress Notes (Signed)
Tommi Rumps, MD Phone: [phone]  AMIIR Taylor is a 88 y.o. male who presents today for hospital follow-up.  Chest pain: Patient had onset of centralized chest pressure.  He noted several episodes prior to the day he went to the hospital though it became more severe that day.  He underwent cardiac catheterization which did not reveal any significant stenosis.  He had widely patent stents.  His lab work did not indicate any cardiac cause for his symptoms.  He was found to have PVCs and bigeminy and was started on metoprolol though this has been discontinued since discharge from the hospital due to hypotension.  He reports heart rates in the 40s at home on pulse oximetry.  It was felt that maybe his chest pain could be GI related.  GERD: Patient has been on Protonix 40 mg twice daily.  He notes no reflux, abdominal pain, blood in his stool.  He notes rare dysphagia.  Back pain: Patient notes his back went out a few weeks back.  He has been following with [facility] for this.  He notes an MRI on 8/23 and he reports this revealed arthritis.  He follows up with orthopedics next week.  Social History   Tobacco Use  Smoking Status Never  Smokeless Tobacco Never    Current Outpatient Medications on File Prior to Visit  Medication Sig Dispense Refill   amiodarone (PACERONE) 100 MG tablet Take 1 tablet (100 mg total) by mouth daily. 90 tablet 3   fenofibrate micronized (ANTARA) 130 MG capsule TAKE 1 CAPSULE BY MOUTH  DAILY BEFORE BREAKFAST 90 capsule 3   finasteride (PROSCAR) 5 MG tablet Take 1 tablet (5 mg total) by mouth daily. 30 tablet 0   levobunolol (BETAGAN) 0.5 % ophthalmic solution Place 1 drop into the right eye 2 (two) times daily.     lisinopril (ZESTRIL) 10 MG tablet Take 1 tablet (10 mg total) by mouth daily. 90 tablet 3   loratadine (CLARITIN) 10 MG tablet Take 1 tablet (10 mg total) by mouth daily. 30 tablet 1   Multiple Vitamin (MULTIVITAMIN PO) Take 1 tablet by  mouth daily.     nitroGLYCERIN (NITROSTAT) 0.4 MG SL tablet DISSOLVE UNDER THE TONGUE 1 TABLET EVERY 5 MINUTES AS NEEDED FOR CHEST PAIN 25 tablet 3   pantoprazole (PROTONIX) 40 MG tablet TAKE ONE (1) TABLET BY MOUTH TWO TIMES PER DAY 60 tablet 1   pravastatin (PRAVACHOL) 40 MG tablet Take 1 tablet (40 mg total) by mouth at bedtime. 90 tablet 3   Rivaroxaban (XARELTO) 15 MG TABS tablet Take 1 tablet (15 mg total) by mouth daily. 90 tablet 3   metoprolol succinate (TOPROL-XL) 25 MG 24 hr tablet Take 25 mg by mouth daily.     No current facility-administered medications on file prior to visit.     ROS see history of present illness  Objective  Physical Exam Vitals:   05/07/22 1103  BP: 130/70  Pulse: (!) 46  Temp: 97.8 F (36.6 C)  SpO2: 99%    BP Readings from Last 3 Encounters:  05/07/22 130/70  04/29/22 137/66  04/01/22 106/70   Wt Readings from Last 3 Encounters:  05/07/22 208 lb 12.8 oz (94.7 kg)  04/29/22 209 lb 14.1 oz (95.2 kg)  04/01/22 207 lb 4 oz (94 kg)    Physical Exam Constitutional:      General: He is not in acute distress.    Appearance: He is not diaphoretic.  Cardiovascular:  Rate and Rhythm: Normal rate. Rhythm irregular.     Heart sounds: Normal heart sounds.  Pulmonary:     Effort: Pulmonary effort is normal.     Breath sounds: Normal breath sounds.  Skin:    General: Skin is warm and dry.  Neurological:     Mental Status: He is alert.    EKG: Sinus rhythm, rate 89, frequent PVCs with ventricular bigeminy, no ischemic changes noted  Assessment/Plan: Please see individual problem list.  Problem List Items Addressed This Visit     Chronic back pain (Chronic)    Chronic issue.  Has been worked up by orthopedics and found to have arthritis.  For now he will monitor as he has declined back injections.  He will see orthopedics as planned.      GERD (gastroesophageal reflux disease) - Primary (Chronic)    Certainly this could be  contributing to his chest pain.  He will continue Protonix 40 mg twice daily.  I will refer back to GI.      Relevant Orders   Ambulatory referral to Gastroenterology   Chest pressure    Unlikely to be cardiac in cause given his reassuring cardiac catheterization.  Certainly could be a GI cause such as GERD or esophageal spasms.  Recent chest x-ray did not reveal a cause.  We will refer back to GI to evaluate for possible GI cause.      PVC's (premature ventricular contractions)    Asymptomatic.  These likely account for his slow heart rate on pulse oximetry as that is likely not picking up all of his PVCs.  Heart rate on EKG acceptable.  He will follow-up with cardiology.      Relevant Medications   metoprolol succinate (TOPROL-XL) 25 MG 24 hr tablet   Other Visit Diagnoses     Need for immunization against influenza       Relevant Orders   Flu Vaccine QUAD High Dose(Fluad) (Completed)   Irregular heartbeat       Relevant Orders   EKG 12-Lead (Completed)       Return in about 3 months (around 08/06/2022).   Tommi Rumps, MD [place]

## 2022-05-07 NOTE — Assessment & Plan Note (Signed)
Certainly this could be contributing to his chest pain.  He will continue Protonix 40 mg twice daily.  I will refer back to GI.

## 2022-05-09 ENCOUNTER — Telehealth: Payer: Self-pay | Admitting: Family Medicine

## 2022-05-09 NOTE — Telephone Encounter (Signed)
Patient has a lab appt 05/13/2022, there are no orders in.

## 2022-05-10 ENCOUNTER — Other Ambulatory Visit: Payer: Self-pay

## 2022-05-10 DIAGNOSIS — I10 Essential (primary) hypertension: Secondary | ICD-10-CM

## 2022-05-10 DIAGNOSIS — E782 Mixed hyperlipidemia: Secondary | ICD-10-CM

## 2022-05-10 NOTE — Telephone Encounter (Signed)
Labs ordered.  Zykeriah Mathia,cma  

## 2022-05-13 ENCOUNTER — Other Ambulatory Visit (INDEPENDENT_AMBULATORY_CARE_PROVIDER_SITE_OTHER): Payer: Medicare HMO

## 2022-05-13 DIAGNOSIS — I10 Essential (primary) hypertension: Secondary | ICD-10-CM

## 2022-05-13 DIAGNOSIS — E782 Mixed hyperlipidemia: Secondary | ICD-10-CM | POA: Diagnosis not present

## 2022-05-13 LAB — LIPID PANEL
Cholesterol: 97 mg/dL (ref 0–200)
HDL: 35.9 mg/dL — ABNORMAL LOW (ref >39.00)
LDL Cholesterol: 46 mg/dL (ref 0–99)
NonHDL: 61.01
Total CHOL/HDL Ratio: 3
Triglycerides: 76 mg/dL (ref 0.0–149.0)
VLDL: 15.2 mg/dL (ref 0.0–40.0)

## 2022-05-13 LAB — CBC
HCT: 31.9 % — ABNORMAL LOW (ref 39.0–52.0)
Hemoglobin: 10.7 g/dL — ABNORMAL LOW (ref 13.0–17.0)
MCHC: 33.3 g/dL (ref 30.0–36.0)
MCV: 99.6 fl (ref 78.0–100.0)
Platelets: 235 10*3/uL (ref 150.0–400.0)
RBC: 3.21 Mil/uL — ABNORMAL LOW (ref 4.22–5.81)
RDW: 16.2 % — ABNORMAL HIGH (ref 11.5–15.5)
WBC: 5.4 10*3/uL (ref 4.0–10.5)

## 2022-05-13 LAB — COMPREHENSIVE METABOLIC PANEL
ALT: 10 U/L (ref 0–53)
AST: 19 U/L (ref 0–37)
Albumin: 4.2 g/dL (ref 3.5–5.2)
Alkaline Phosphatase: 27 U/L — ABNORMAL LOW (ref 39–117)
BUN: 25 mg/dL — ABNORMAL HIGH (ref 6–23)
CO2: 26 mEq/L (ref 19–32)
Calcium: 9.7 mg/dL (ref 8.4–10.5)
Chloride: 106 mEq/L (ref 96–112)
Creatinine, Ser: 1.55 mg/dL — ABNORMAL HIGH (ref 0.40–1.50)
GFR: 39.18 mL/min — ABNORMAL LOW (ref >60.00)
Glucose, Bld: 105 mg/dL — ABNORMAL HIGH (ref 70–99)
Potassium: 4.7 mEq/L (ref 3.5–5.1)
Sodium: 141 mEq/L (ref 135–145)
Total Bilirubin: 1.1 mg/dL (ref 0.2–1.2)
Total Protein: 6.6 g/dL (ref 6.0–8.3)

## 2022-05-17 ENCOUNTER — Telehealth: Payer: Self-pay

## 2022-05-17 NOTE — Telephone Encounter (Signed)
Patient called stating that he was at the hospital 2 weeks ago and they thought that he was having a heart attack. However, he had the works done and nothing was wrong. He then went to see his PCP and everything checkout to be fine too but recommended to be seen by Dr. [name] in case it was indigestion. Therefore, I scheduled him an appointment to come in to see Dr. [name] on Monday 05/20/2022 at 2:15 PM. Patient agreed and stated that he would come in to see him.

## 2022-05-20 ENCOUNTER — Encounter: Payer: Self-pay | Admitting: Gastroenterology

## 2022-05-20 ENCOUNTER — Ambulatory Visit (INDEPENDENT_AMBULATORY_CARE_PROVIDER_SITE_OTHER): Payer: Medicare HMO | Admitting: Gastroenterology

## 2022-05-20 VITALS — BP 151/74 | HR 81 | Temp 97.7°F | Wt 204.0 lb

## 2022-05-20 DIAGNOSIS — R072 Precordial pain: Secondary | ICD-10-CM

## 2022-05-20 DIAGNOSIS — Z8719 Personal history of other diseases of the digestive system: Secondary | ICD-10-CM | POA: Diagnosis not present

## 2022-05-20 MED ORDER — PANTOPRAZOLE SODIUM 40 MG PO TBEC
DELAYED_RELEASE_TABLET | ORAL | 3 refills | Status: DC
Start: 2022-05-20 — End: 2023-01-28

## 2022-05-20 NOTE — Progress Notes (Signed)
[name] MD, MRCP(U.K) [address]  [place], [place] [address]  Main: [phone]  Fax: [phone]   Primary Care Physician: [name], MD  Primary Gastroenterologist:  Dr. [name]   Chief Complaint  Patient presents with   Chest Pain    HPI: William Taylor is a 88 y.o. male  Summary of history :   He has a history of CAD,stent placment in 2005 , Atrial fibrillation 2011. Seen previously 2017-2020 for iron deficiency anemia . 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   10/03/16- capsule study showed no bleeding but a few small polyps were seen .  09/2017 : iron studies were normal .  11/2017 b12 and folate normal.  Seen Dr Janese [facility] - diagnosed with cold agglutinin hemolytic anemia Admitted 09/20/2018 with GI bleed , postural hypotension,melena .  09/22/2018 : Push enteroscopy -1 cm hiatal hernia.   09/08/2020: Hb 11.6 grams stable , Cr 1.43   He was seen by Dr Rockey Situ in 10/16/2020 for A fibb  09/08/2020: CT angiogram PE protocol shows chololithiasis       Interval history 11/01/2020-05/20/2022   04/27/2022: Present to the emergency room with chest pain associated with shortness of breath.  Underwent cardiac catheterization which showed no significant stenosis.   05/13/2022: Hemoglobin 10.7 g, LFTs normal for the past 8 months 11/11/2021 CT scan of the abdomen showed cholelithiasis I last seen him back in March 2022 for incidental finding of cholelithiasis with a background of chest pain.  I explained to him that he did not have pain which was suggestive of biliary colic and the plan was to watch and wait and see how he does.   He states that when he developed chest pain which took him to the emergency room it was a pleasure in the center of his  chest which woke him up in the night associated with shortness of breath.  It was not after he ate it with a few hours after his meals.  He takes Protonix 40 mg twice a day but takes it along with his meals.    Current Outpatient Medications  Medication Sig Dispense Refill   amiodarone (PACERONE) 100 MG tablet Take 1 tablet (100 mg total) by mouth daily. 90 tablet 3   fenofibrate micronized (ANTARA) 130 MG capsule TAKE 1 CAPSULE BY MOUTH  DAILY BEFORE BREAKFAST 90 capsule 3   finasteride (PROSCAR) 5 MG tablet Take 1 tablet (5 mg total) by mouth daily. 30 tablet 0   levobunolol (BETAGAN) 0.5 % ophthalmic solution Place 1 drop into the right eye 2 (two) times daily.     lisinopril (ZESTRIL) 10 MG tablet Take 1 tablet (10 mg total) by mouth daily. 90 tablet 3   loratadine (CLARITIN) 10 MG tablet Take 1 tablet (10 mg total) by mouth daily. 30 tablet 1   Multiple Vitamin (MULTIVITAMIN PO) Take 1 tablet by mouth daily.     nitroGLYCERIN (NITROSTAT) 0.4 MG SL tablet DISSOLVE UNDER THE TONGUE 1 TABLET EVERY 5 MINUTES AS NEEDED FOR CHEST PAIN 25 tablet 3   pantoprazole (PROTONIX) 40 MG tablet TAKE ONE (1) TABLET BY MOUTH TWO TIMES PER DAY 90 tablet 3   pravastatin (PRAVACHOL) 40 MG tablet Take 1 tablet (40 mg total) by mouth at bedtime. 90 tablet 3  Rivaroxaban (XARELTO) 15 MG TABS tablet Take 1 tablet (15 mg total) by mouth daily. 90 tablet 3   No current facility-administered medications for this visit.    Allergies as of 05/20/2022 - Review Complete 05/20/2022  Allergen Reaction Noted   Bee venom Swelling 07/17/2015   Sulfasalazine Hives and Swelling 01/22/2016   Sulfa antibiotics Hives and Swelling 01/22/2016   Warfarin  06/12/2021   Warfarin and related Other (See Comments) 05/18/2012    ROS:  General: Negative for anorexia, weight loss, fever, chills, fatigue, weakness. ENT: Negative for hoarseness, difficulty swallowing , nasal congestion. CV: Negative for chest pain, angina,  palpitations, dyspnea on exertion, peripheral edema.  Respiratory: Negative for dyspnea at rest, dyspnea on exertion, cough, sputum, wheezing.  GI: See history of present illness. GU:  Negative for dysuria, hematuria, urinary incontinence, urinary frequency, nocturnal urination.  Endo: Negative for unusual weight change.    Physical Examination:   BP (!) 151/74   Pulse 81   Temp 97.7 F (36.5 C) (Oral)   Wt 204 lb (92.5 kg)   BMI 24.83 kg/m   General: Well-nourished, well-developed in no acute distress.  Eyes: No icterus. Conjunctivae pink. Neuro: Alert and oriented x 3.  Grossly intact. Skin: Warm and dry, no jaundice.   Psych: Alert and cooperative, normal mood and affect.   Imaging Studies: CARDIAC CATHETERIZATION  Result Date: 04/29/2022   Prox RCA lesion is 40% stenosed.   Mid RCA to Dist RCA lesion is 30% stenosed.   3rd Mrg lesion is 10% stenosed.   Dist LAD lesion is 60% stenosed.   Mid LAD lesion is 20% stenosed.   Mid LAD to Dist LAD lesion is 20% stenosed.   Prox LAD to Mid LAD lesion is 20% stenosed. 1.  Widely patent LAD and OM3 stents with minimal restenosis.  Moderately calcified coronary arteries with mild to moderate nonobstructive coronary artery disease. 2.  Left ventricular angiography was not performed due to chronic kidney disease.  LVEDP was mildly elevated. Recommendations: Recommend continuing medical therapy. Xarelto can be resumed this evening. The patient's reported episodes of chest pain were in the setting of palpitations.  He is noted to be in ventricular bigeminy during cardiac catheterization.  I added small dose of Toprol to see if he can tolerate.  He had issues with orthostatic hypotension in the past. The patient can likely be discharged home in the afternoon if he remains stable.   DG Chest 2 View  Result Date: 04/27/2022 CLINICAL DATA:  Chest pain that started a few days ago EXAM: CHEST - 2 VIEW COMPARISON:  08/31/2021 FINDINGS: Normal heart size  and mediastinal contours. Coronary atherosclerosis and stenting. There is no edema, consolidation, effusion, or pneumothorax. Prominent osteopenia. IMPRESSION: Stable from prior.  No acute finding. Electronically Signed   By: Jorje Guild M.D.   On: 04/27/2022 12:15    Assessment and Plan:   ANIKET PAYE is a 88 y.o. y/o male with a prior history of iron deficiency anemia, hiatal hernia, AVM's of the colon, cholelithiasis.  Recently admitted for chest pain along with shortness of breath with a history of cardiac disorders he underwent cardiac catheterization and no abnormalities were seen.  He is here today to see me for the same to determine if he has noncardiac chest pain.  I explained to him that the differential diagnosis for his pain could be an esophageal spasm as his pain responded to nitroglycerin versus reflux although he does not have pain suggestive of  reflux typically which causes heartburn but his pain was more like a pressure in the center of his chest associated with shortness of breath.  And occasionally biliary colic can cause similar symptoms but his pain was a few hours after his meals.  It is possible that his Protonix was not working correctly as he was taking it along with his meals.  I gave him the option to speak to a surgeon to discuss whether it would be worthwhile to consider a cholecystectomy but he was not keen at this point of time.  He would like to try the Protonix twice a day taken at the current times to see if it would help him with his symptoms which has had recurrence in the past.  He does have a hiatal hernia which predisposes him to reflux.  If it were to be a esophageal spasm the only way to diagnose it would be to perform esophageal manometry and subsequently treatment would not be easy either as the options are very limited I will give a prescription of Protonix 40 mg twice a day for 3 months with 3 refills    Dr [name]  MD,MRCP [facility]) Follow up in as  needed

## 2022-07-16 ENCOUNTER — Telehealth: Payer: Self-pay

## 2022-07-16 NOTE — Telephone Encounter (Signed)
Patient called and left me a voicemail to call him back. I then call him back and he did not answer his home phone nor his cell phone. However, I was able to leave a voicemail on his cell phone for him to call me back.

## 2022-07-24 ENCOUNTER — Emergency Department: Payer: Medicare HMO

## 2022-07-24 ENCOUNTER — Other Ambulatory Visit: Payer: Self-pay

## 2022-07-24 ENCOUNTER — Inpatient Hospital Stay
Admission: EM | Admit: 2022-07-24 | Discharge: 2022-07-27 | DRG: 194 | Disposition: A | Discharge status: Home / Self Care | Payer: Medicare HMO | Attending: Internal Medicine | Admitting: Internal Medicine

## 2022-07-24 ENCOUNTER — Telehealth: Payer: Self-pay | Admitting: Family Medicine

## 2022-07-24 DIAGNOSIS — J1100 Influenza due to unidentified influenza virus with unspecified type of pneumonia: Secondary | ICD-10-CM | POA: Diagnosis not present

## 2022-07-24 DIAGNOSIS — I252 Old myocardial infarction: Secondary | ICD-10-CM

## 2022-07-24 DIAGNOSIS — Z1152 Encounter for screening for COVID-19: Secondary | ICD-10-CM

## 2022-07-24 DIAGNOSIS — I700 Atherosclerosis of aorta: Secondary | ICD-10-CM | POA: Diagnosis present

## 2022-07-24 DIAGNOSIS — R079 Chest pain, unspecified: Secondary | ICD-10-CM | POA: Diagnosis not present

## 2022-07-24 DIAGNOSIS — Z86711 Personal history of pulmonary embolism: Secondary | ICD-10-CM | POA: Diagnosis present

## 2022-07-24 DIAGNOSIS — Z91030 Bee allergy status: Secondary | ICD-10-CM

## 2022-07-24 DIAGNOSIS — R6510 Systemic inflammatory response syndrome (SIRS) of non-infectious origin without acute organ dysfunction: Secondary | ICD-10-CM

## 2022-07-24 DIAGNOSIS — J069 Acute upper respiratory infection, unspecified: Secondary | ICD-10-CM

## 2022-07-24 DIAGNOSIS — I480 Paroxysmal atrial fibrillation: Secondary | ICD-10-CM | POA: Diagnosis present

## 2022-07-24 DIAGNOSIS — Z8546 Personal history of malignant neoplasm of prostate: Secondary | ICD-10-CM

## 2022-07-24 DIAGNOSIS — Z7901 Long term (current) use of anticoagulants: Secondary | ICD-10-CM

## 2022-07-24 DIAGNOSIS — J439 Emphysema, unspecified: Secondary | ICD-10-CM | POA: Diagnosis present

## 2022-07-24 DIAGNOSIS — I10 Essential (primary) hypertension: Secondary | ICD-10-CM | POA: Diagnosis present

## 2022-07-24 DIAGNOSIS — Z85828 Personal history of other malignant neoplasm of skin: Secondary | ICD-10-CM

## 2022-07-24 DIAGNOSIS — I2510 Atherosclerotic heart disease of native coronary artery without angina pectoris: Secondary | ICD-10-CM | POA: Diagnosis present

## 2022-07-24 DIAGNOSIS — E785 Hyperlipidemia, unspecified: Secondary | ICD-10-CM | POA: Diagnosis present

## 2022-07-24 DIAGNOSIS — Z882 Allergy status to sulfonamides status: Secondary | ICD-10-CM

## 2022-07-24 DIAGNOSIS — I452 Bifascicular block: Secondary | ICD-10-CM | POA: Diagnosis present

## 2022-07-24 DIAGNOSIS — Z888 Allergy status to other drugs, medicaments and biological substances status: Secondary | ICD-10-CM

## 2022-07-24 DIAGNOSIS — N179 Acute kidney failure, unspecified: Secondary | ICD-10-CM | POA: Diagnosis present

## 2022-07-24 DIAGNOSIS — K219 Gastro-esophageal reflux disease without esophagitis: Secondary | ICD-10-CM | POA: Diagnosis present

## 2022-07-24 LAB — TROPONIN I (HIGH SENSITIVITY)
Troponin I (High Sensitivity): 9 ng/L (ref <18)
Troponin I (High Sensitivity): 10 ng/L (ref <18)

## 2022-07-24 LAB — CBC
HCT: 31.2 % — ABNORMAL LOW (ref 39.0–52.0)
Hemoglobin: 10.6 g/dL — ABNORMAL LOW (ref 13.0–17.0)
MCH: 34.6 pg — ABNORMAL HIGH (ref 26.0–34.0)
MCHC: 34 g/dL (ref 30.0–36.0)
MCV: 102 fL — ABNORMAL HIGH (ref 80.0–100.0)
Platelets: 275 10*3/uL (ref 150–400)
RBC: 3.06 MIL/uL — ABNORMAL LOW (ref 4.22–5.81)
RDW: 15.3 % (ref 11.5–15.5)
WBC: 10.9 10*3/uL — ABNORMAL HIGH (ref 4.0–10.5)
nRBC: 0 % (ref 0.0–0.2)

## 2022-07-24 LAB — BASIC METABOLIC PANEL
Anion gap: 8 (ref 5–15)
BUN: 30 mg/dL — ABNORMAL HIGH (ref 8–23)
CO2: 22 mmol/L (ref 22–32)
Calcium: 9.6 mg/dL (ref 8.9–10.3)
Chloride: 110 mmol/L (ref 98–111)
Creatinine, Ser: 1.48 mg/dL — ABNORMAL HIGH (ref 0.61–1.24)
GFR, Estimated: 45 mL/min — ABNORMAL LOW (ref >60)
Glucose, Bld: 121 mg/dL — ABNORMAL HIGH (ref 70–99)
Potassium: 4.1 mmol/L (ref 3.5–5.1)
Sodium: 140 mmol/L (ref 135–145)

## 2022-07-24 LAB — BRAIN NATRIURETIC PEPTIDE: B Natriuretic Peptide: 54.7 pg/mL (ref 0.0–100.0)

## 2022-07-24 LAB — SARS CORONAVIRUS 2 BY RT PCR: SARS Coronavirus 2 by RT PCR: NEGATIVE

## 2022-07-24 MED ORDER — ACETAMINOPHEN 500 MG PO TABS
1000.0000 mg | ORAL_TABLET | Freq: Once | ORAL | Status: AC
  Administered 2022-07-24: 1000 mg via ORAL
  Filled 2022-07-24: qty 2

## 2022-07-24 MED ORDER — SODIUM CHLORIDE 0.9 % IV BOLUS: 500.0000 mL | Freq: Once | INTRAVENOUS | Status: DC

## 2022-07-24 MED ORDER — SODIUM CHLORIDE 0.9 % IV SOLN
2.0000 g | Freq: Once | INTRAVENOUS | Status: AC
  Administered 2022-07-24: 2 g via INTRAVENOUS
  Filled 2022-07-24: qty 20

## 2022-07-24 MED ORDER — SODIUM CHLORIDE 0.9 % IV BOLUS
1000.0000 mL | Freq: Once | INTRAVENOUS | Status: AC
  Administered 2022-07-24: 1000 mL via INTRAVENOUS

## 2022-07-24 MED ORDER — SODIUM CHLORIDE 0.9 % IV SOLN
500.0000 mg | Freq: Once | INTRAVENOUS | Status: AC
  Administered 2022-07-25: 500 mg via INTRAVENOUS
  Filled 2022-07-24: qty 5

## 2022-07-24 NOTE — ED Triage Notes (Signed)
Pt comes with c/o cp that started this am. Pt states left sided pain.

## 2022-07-24 NOTE — Telephone Encounter (Signed)
Access nurse called back stating the patient did not want to go to the ED. She transferred the patient's wife over to me. I talked with her and the patient . After the patient thought it over he stated he would go to the ED.

## 2022-07-24 NOTE — Telephone Encounter (Signed)
Pt wife called stating the pt is cold, his chest hurt and he is achy. Sent to access nurse

## 2022-07-24 NOTE — ED Notes (Signed)
Pt ambulated by RN per order, pt became tachycardic 110-125 and complained of being "shaky". O2 saturation remained 95-100 throughout.

## 2022-07-24 NOTE — ED Provider Notes (Signed)
[facility] Provider Note    Event Date/Time   First MD Initiated Contact with Patient 07/24/22 2117     (approximate)   History   Chest Pain   HPI  William Taylor is a 88 y.o. male  here with weakness, cough, SOB. Pt states that over hte past several days he has felt "awful." Reports he has had cough, SOB, and fatigue and has had difficulty even getting around the house today due to this. He has had a mild cough for a week or so but this worsened over past few days, along with a dull, aching chest pressure which felt similar to his prior MI. He states that while the chest pain has been somewhat recurrent, the cough, SOB, and fatigue is new. No nausea, but he has not had much of an appetite. No abdominal pain. No specific sick contacts.      Physical Exam   Triage Vital Signs: ED Triage Vitals  Enc Vitals Group     BP 07/24/22 1802 125/81     Pulse Rate 07/24/22 1802 (!) 111     Resp 07/24/22 1802 16     Temp 07/24/22 1802 98.4 F (36.9 C)     Temp Source 07/24/22 1802 Oral     SpO2 07/24/22 1802 97 %     Weight --      Height --      Head Circumference --      Peak Flow --      Pain Score 07/24/22 1803 7     Pain Loc --      Pain Edu? --      Excl. in [place]? --     Most recent vital signs: Vitals:   07/24/22 2335 07/25/22 0000  BP: 128/80 (!) 142/98  Pulse: 100 (!) 102  Resp: 10 (!) 21  Temp:    SpO2: 96% 95%     General: Awake, no distress.  CV:  Good peripheral perfusion. Tachycardic, regular. Resp:  Mild tachypnea noted, course breath sounds but overall normal aeration. Abd:  No distention. No tenderness. Other:  Mildly dry MM.   ED Results / Procedures / Treatments   Labs (all labs ordered are listed, but only abnormal results are displayed) Labs Reviewed  BASIC METABOLIC PANEL - Abnormal; Notable for the following components:      Result Value   Glucose, Bld 121 (*)    BUN 30 (*)    Creatinine, Ser 1.48 (*)    GFR,  Estimated 45 (*)    All other components within normal limits  CBC - Abnormal; Notable for the following components:   WBC 10.9 (*)    RBC 3.06 (*)    Hemoglobin 10.6 (*)    HCT 31.2 (*)    MCV 102.0 (*)    MCH 34.6 (*)    All other components within normal limits  URINALYSIS, ROUTINE W REFLEX MICROSCOPIC - Abnormal; Notable for the following components:   Color, Urine YELLOW (*)    APPearance CLEAR (*)    All other components within normal limits  SARS CORONAVIRUS 2 BY RT PCR  CULTURE, BLOOD (SINGLE)  RESPIRATORY PANEL BY PCR  CULTURE, BLOOD (SINGLE)  BRAIN NATRIURETIC PEPTIDE  PROCALCITONIN  TROPONIN I (HIGH SENSITIVITY)  TROPONIN I (HIGH SENSITIVITY)     EKG Sinus tachycardia, VR 108. PR 167, QRs 100, QTc 459. No acute ST elevations. No ischemia or infarct.   RADIOLOGY CXR; Normal, no consolidation  I also independently reviewed and agree with radiologist interpretations.   PROCEDURES:  Critical Care performed: No  .1-3 Lead EKG Interpretation  Performed by: Duffy Bruce, MD Authorized by: Duffy Bruce, MD     Interpretation: non-specific     ECG rate:  100-120   ECG rate assessment: tachycardic     Rhythm: sinus tachycardia     Ectopy: none     Conduction: normal   Comments:     Indication: SOB     MEDICATIONS ORDERED IN ED: Medications  azithromycin (ZITHROMAX) 500 mg in sodium chloride 0.9 % 250 mL IVPB (has no administration in time range)  acetaminophen (TYLENOL) tablet 1,000 mg (1,000 mg Oral Given 07/24/22 2349)  cefTRIAXone (ROCEPHIN) 2 g in sodium chloride 0.9 % 100 mL IVPB (0 g Intravenous Stopped 07/25/22 0030)  sodium chloride 0.9 % bolus 1,000 mL (1,000 mLs Intravenous New Bag/Given 07/24/22 2348)     IMPRESSION / MDM / ASSESSMENT AND PLAN / ED COURSE  I reviewed the triage vital signs and the nursing notes.                              Differential diagnosis includes, but is not limited to, CAP, URI, COVID,  emphysema/bronchitis exacerbation, CHF, anemia, ACS, PE  Patient's presentation is most consistent with acute presentation with potential threat to life or bodily function.  The patient is on the cardiac monitor to evaluate for evidence of arrhythmia and/or significant heart rate changes.  88 yo M with PMHx CAD, HTN, HLD, pAFib on Xarelto, here with cough, weakness. Pt febrile, tachycardic here with concern for possible PNA clinically. CMP shows baseline CKD. CBC notable for leukocytosis of 10.9k. Trop negative, BNP normal, doubt CHF and EKG is nonischemic. Procalcitonin is pending. Suspect viral syndrome w/ weakness but given that pt meets SIRS criteria with his age/comorbidities, will cover empirically and admit. Pt in agreement. Hospitalist to admit.  FINAL CLINICAL IMPRESSION(S) / ED DIAGNOSES   Final diagnoses:  SIRS (systemic inflammatory response syndrome) (HCC)  Upper respiratory tract infection, unspecified type     Rx / DC Orders   ED Discharge Orders     None        Note:  This document was prepared using Dragon voice recognition software and may include unintentional dictation errors.   Duffy Bruce, MD 07/25/22 [phone]

## 2022-07-25 ENCOUNTER — Telehealth (INDEPENDENT_AMBULATORY_CARE_PROVIDER_SITE_OTHER): Payer: Medicare HMO

## 2022-07-25 ENCOUNTER — Encounter: Payer: Self-pay | Admitting: Internal Medicine

## 2022-07-25 DIAGNOSIS — E785 Hyperlipidemia, unspecified: Secondary | ICD-10-CM | POA: Diagnosis present

## 2022-07-25 DIAGNOSIS — K219 Gastro-esophageal reflux disease without esophagitis: Secondary | ICD-10-CM | POA: Diagnosis present

## 2022-07-25 DIAGNOSIS — R079 Chest pain, unspecified: Secondary | ICD-10-CM

## 2022-07-25 DIAGNOSIS — I10 Essential (primary) hypertension: Secondary | ICD-10-CM | POA: Diagnosis present

## 2022-07-25 DIAGNOSIS — Z882 Allergy status to sulfonamides status: Secondary | ICD-10-CM | POA: Diagnosis not present

## 2022-07-25 DIAGNOSIS — J439 Emphysema, unspecified: Secondary | ICD-10-CM | POA: Diagnosis present

## 2022-07-25 DIAGNOSIS — Z85828 Personal history of other malignant neoplasm of skin: Secondary | ICD-10-CM | POA: Diagnosis not present

## 2022-07-25 DIAGNOSIS — N179 Acute kidney failure, unspecified: Secondary | ICD-10-CM | POA: Diagnosis present

## 2022-07-25 DIAGNOSIS — Z888 Allergy status to other drugs, medicaments and biological substances status: Secondary | ICD-10-CM | POA: Diagnosis not present

## 2022-07-25 DIAGNOSIS — I480 Paroxysmal atrial fibrillation: Secondary | ICD-10-CM | POA: Diagnosis present

## 2022-07-25 DIAGNOSIS — I700 Atherosclerosis of aorta: Secondary | ICD-10-CM | POA: Diagnosis present

## 2022-07-25 DIAGNOSIS — Z1152 Encounter for screening for COVID-19: Secondary | ICD-10-CM | POA: Diagnosis not present

## 2022-07-25 DIAGNOSIS — Z7901 Long term (current) use of anticoagulants: Secondary | ICD-10-CM | POA: Diagnosis not present

## 2022-07-25 DIAGNOSIS — J1100 Influenza due to unidentified influenza virus with unspecified type of pneumonia: Secondary | ICD-10-CM | POA: Diagnosis present

## 2022-07-25 DIAGNOSIS — Z8546 Personal history of malignant neoplasm of prostate: Secondary | ICD-10-CM | POA: Diagnosis not present

## 2022-07-25 DIAGNOSIS — Z91030 Bee allergy status: Secondary | ICD-10-CM | POA: Diagnosis not present

## 2022-07-25 DIAGNOSIS — I252 Old myocardial infarction: Secondary | ICD-10-CM | POA: Diagnosis not present

## 2022-07-25 DIAGNOSIS — Z86711 Personal history of pulmonary embolism: Secondary | ICD-10-CM | POA: Diagnosis not present

## 2022-07-25 DIAGNOSIS — I452 Bifascicular block: Secondary | ICD-10-CM | POA: Diagnosis present

## 2022-07-25 DIAGNOSIS — I2510 Atherosclerotic heart disease of native coronary artery without angina pectoris: Secondary | ICD-10-CM | POA: Diagnosis present

## 2022-07-25 LAB — RESPIRATORY PANEL BY PCR

## 2022-07-25 LAB — URINALYSIS, ROUTINE W REFLEX MICROSCOPIC
Bilirubin Urine: NEGATIVE
Glucose, UA: NEGATIVE mg/dL
Hgb urine dipstick: NEGATIVE
Ketones, ur: NEGATIVE mg/dL
Leukocytes,Ua: NEGATIVE
Nitrite: NEGATIVE
Protein, ur: NEGATIVE mg/dL
Specific Gravity, Urine: 1.016 (ref 1.005–1.030)
pH: 5 (ref 5.0–8.0)

## 2022-07-25 LAB — D-DIMER, QUANTITATIVE: D-Dimer, Quant: 0.49 ug/mL-FEU (ref 0.00–0.50)

## 2022-07-25 LAB — PROCALCITONIN: Procalcitonin: 0.11 ng/mL

## 2022-07-25 MED ORDER — LEVOBUNOLOL HCL 0.5 % OP SOLN
1.0000 [drp] | Freq: Two times a day (BID) | OPHTHALMIC | Status: DC
  Administered 2022-07-26 – 2022-07-27 (×2): 1 [drp] via OPHTHALMIC
  Filled 2022-07-25: qty 5

## 2022-07-25 MED ORDER — LORATADINE 10 MG PO TABS
10.0000 mg | ORAL_TABLET | Freq: Every day | ORAL | Status: DC
  Administered 2022-07-25 – 2022-07-27 (×3): 10 mg via ORAL
  Filled 2022-07-25 (×3): qty 1

## 2022-07-25 MED ORDER — FINASTERIDE 5 MG PO TABS
5.0000 mg | ORAL_TABLET | Freq: Every day | ORAL | Status: DC
  Administered 2022-07-25 – 2022-07-27 (×3): 5 mg via ORAL
  Filled 2022-07-25 (×3): qty 1

## 2022-07-25 MED ORDER — PANTOPRAZOLE SODIUM 40 MG PO TBEC
40.0000 mg | DELAYED_RELEASE_TABLET | Freq: Two times a day (BID) | ORAL | Status: DC
  Administered 2022-07-25 – 2022-07-27 (×5): 40 mg via ORAL
  Filled 2022-07-25 (×5): qty 1

## 2022-07-25 MED ORDER — SODIUM CHLORIDE 0.9 % IV SOLN
1.0000 g | INTRAVENOUS | Status: DC
  Administered 2022-07-25 – 2022-07-27 (×2): 1 g via INTRAVENOUS
  Filled 2022-07-25 (×3): qty 10

## 2022-07-25 MED ORDER — ONDANSETRON HCL 4 MG/2ML IJ SOLN: 4.0000 mg | Freq: Four times a day (QID) | INTRAMUSCULAR | Status: DC | PRN

## 2022-07-25 MED ORDER — RIVAROXABAN 15 MG PO TABS
15.0000 mg | ORAL_TABLET | Freq: Every day | ORAL | Status: DC
  Administered 2022-07-25 – 2022-07-26 (×2): 15 mg via ORAL
  Filled 2022-07-25 (×3): qty 1

## 2022-07-25 MED ORDER — ACETAMINOPHEN 325 MG PO TABS
650.0000 mg | ORAL_TABLET | ORAL | Status: DC | PRN
  Administered 2022-07-25 – 2022-07-26 (×3): 650 mg via ORAL
  Filled 2022-07-25 (×3): qty 2

## 2022-07-25 MED ORDER — LEVOBUNOLOL HCL 0.5 % OP SOLN
1.0000 [drp] | Freq: Two times a day (BID) | OPHTHALMIC | Status: DC
  Filled 2022-07-25: qty 5

## 2022-07-25 MED ORDER — MENTHOL 3 MG MT LOZG
1.0000 | LOZENGE | OROMUCOSAL | Status: DC | PRN
  Filled 2022-07-25: qty 9

## 2022-07-25 MED ORDER — SODIUM CHLORIDE 0.9 % IV SOLN
500.0000 mg | INTRAVENOUS | Status: DC
  Administered 2022-07-26 – 2022-07-27 (×2): 500 mg via INTRAVENOUS
  Filled 2022-07-25 (×3): qty 5

## 2022-07-25 MED ORDER — AMIODARONE HCL 200 MG PO TABS
100.0000 mg | ORAL_TABLET | Freq: Every day | ORAL | Status: DC
  Administered 2022-07-25 – 2022-07-27 (×3): 100 mg via ORAL
  Filled 2022-07-25 (×3): qty 1

## 2022-07-25 NOTE — Assessment & Plan Note (Signed)
Iv ppi.   

## 2022-07-25 NOTE — Assessment & Plan Note (Signed)
Currently in sinus rhythm. Will continue patient on amiodarone.

## 2022-07-25 NOTE — H&P (Signed)
History and Physical    Chief Complaint: Chest pain   HISTORY OF PRESENT ILLNESS: William Taylor is an 88 y.o. male coming to the ED with complaints of chest pain, left-sided. Has felt shaky and ill.  HPI as per chart review and ED MD note patient is alert oriented And gives minimal history.  Wife at bedside in the ED. chest CT done in the emergency room without contrast Shows no acute intrathoracic process stable emphysema and aortic atherosclerosis and coronary artery atherosclerosis. Unclear etiology for infection.  In the emergency room patient received antibiotic coverage for CAP with presumed pneumonia.  Which we will continue. Pt has  Past Medical History:  Diagnosis Date   Cancer [facility])    Prostate, followed by Dr. Jacqlyn Larsen   Colon polyp    Coronary artery disease    a. 2005 s/p PCI LAD ([facility]); b 2012 s/p PCI OM2; c.10/2013 Cath: LAD 89mISR, 431mD1 90, D2 70, OM2 80 w/ patent stent;  d. 06/2019 Low risk MV; e. 07/2020 MV: EF 77%, no ischemia/infarct.   Gall stones    Hyperlipidemia    Hypertension    MI ([facility]ocardial infarction) ([facility]   2015   Orthostatic hypotension    a. prev on midodrine/florinef/northera.   PAF (paroxysmal atrial fibrillation) (HCC)    a. on xarelto and amio (CHA2DS2VASc = 4).   Skin cancer    Review of Systems  Unable to perform ROS: Age  Respiratory:  Positive for cough.   Cardiovascular:  Positive for chest pain.   Allergies  Allergen Reactions   Bee Venom Swelling   Sulfasalazine Hives and Swelling   Sulfa Antibiotics Hives and Swelling   Warfarin    Warfarin And Related Other (See Comments)    Chest pain   Past Surgical History:  Procedure Laterality Date   CARDIAC CATHETERIZATION  10/2013   [facility]   CATARACT EXTRACTION  Oct. 3, 2012   right eye   colonoscopy     COLONOSCOPY W/ POLYPECTOMY  2015   Dr ReRayann Heman COLONOSCOPY WITH PROPOFOL N/A 08/15/2016   Procedure: COLONOSCOPY WITH PROPOFOL;  Surgeon: KiJonathon BellowsMD;  Location: [facility]  ENDOSCOPY;  Service: Endoscopy;  Laterality: N/A;   CORONARY ANGIOPLASTY  2009   2005; s/p stent   ENTEROSCOPY N/A 09/22/2018   Procedure: ENTEROSCOPY;  Surgeon: VaLin LandsmanMD;  [facility];  Service: Gastroenterology;  Laterality: N/A;   ESOPHAGOGASTRODUODENOSCOPY (EGD) WITH PROPOFOL N/A 08/15/2016   Procedure: ESOPHAGOGASTRODUODENOSCOPY (EGD) WITH PROPOFOL;  Surgeon: KiJonathon BellowsMD;  Location: [facility] ENDOSCOPY;  Service: Endoscopy;  Laterality: N/A;   GIVENS CAPSULE STUDY N/A 09/26/2016   Procedure: GIVENS CAPSULE STUDY;  Surgeon: KiJonathon BellowsMD;  Location: [facility] ENDOSCOPY;  Service: Endoscopy;  Laterality: N/A;   HERNIA REPAIR  2013   LEFT HEART CATH AND CORONARY ANGIOGRAPHY N/A 04/29/2022   Procedure: LEFT HEART CATH AND CORONARY ANGIOGRAPHY;  Surgeon: ArWellington HampshireMD;  [facility];  Service: Cardiovascular;  Laterality: N/A;   LUMBAR SPINE SURGERY     UPPER GI ENDOSCOPY  Sept 2015   Dr ReRayann Heman MEDICATIONS: Current Outpatient Medications  Medication Instructions   amiodarone (PACERONE) 100 mg, Oral, Daily   fenofibrate micronized (ANTARA) 130 MG capsule TAKE 1 CAPSULE BY MOUTH  DAILY BEFORE BREAKFAST   finasteride (PROSCAR) 5 mg, Oral, Daily   levobunolol (BETAGAN) 0.5 % ophthalmic solution 1 drop, Right Eye, 2 times daily   lisinopril (ZESTRIL) 10 mg,  Oral, Daily   loratadine (CLARITIN) 10 mg, Oral, Daily   Multiple Vitamin (MULTIVITAMIN PO) 1 tablet, Oral, Daily   nitroGLYCERIN (NITROSTAT) 0.4 MG SL tablet DISSOLVE UNDER THE TONGUE 1 TABLET EVERY 5 MINUTES AS NEEDED FOR CHEST PAIN   pantoprazole (PROTONIX) 40 MG tablet TAKE ONE (1) TABLET BY MOUTH TWO TIMES PER DAY   pravastatin (PRAVACHOL) 40 mg, Oral, Daily at bedtime   Rivaroxaban (XARELTO) 15 mg, Oral, Daily     amiodarone  100 mg Oral Daily   finasteride  5 mg Oral Daily   levobunolol  1 drop Right Eye BID   loratadine  10 mg Oral Daily   pantoprazole  40 mg Oral BID   Rivaroxaban   15 mg Oral Daily     azithromycin (ZITHROMAX) 500 mg in sodium chloride 0.9 % 250 mL IVPB 500 mg (07/25/22 0137)   azithromycin (ZITHROMAX) 500 mg in sodium chloride 0.9 % 250 mL IVPB     cefTRIAXone (ROCEPHIN)  IV        ST 108 incomplete RBBB LAFB - new.  Negative cardiac enzymes.   ED Course: Pt in Ed alert awake oriented febrile,.s cr of 1.48 and gfr 45. C/h anemia of 10.6 Vitals:   07/25/22 0100 07/25/22 0130 07/25/22 0140 07/25/22 0200  BP: 125/73 125/67  126/65  Pulse: (!) 101 98  96  Resp: '16 10  16  '$ Temp:   99.6 F (37.6 C)   TempSrc:   Oral   SpO2: 94% 94%  95%    Total I/O In: 1100 [IV Piggyback:1100] Out: -  SpO2: 95 % Blood work in ed shows: Results for orders placed or performed during the hospital encounter of 07/24/22 (from the past 24 hour(s))  Basic metabolic panel     Status: Abnormal   Collection Time: 07/24/22  6:01 PM  Result Value Ref Range   Sodium 140 135 - 145 mmol/L   Potassium 4.1 3.5 - 5.1 mmol/L   Chloride 110 98 - 111 mmol/L   CO2 22 22 - 32 mmol/L   Glucose, Bld 121 (H) 70 - 99 mg/dL   BUN 30 (H) 8 - 23 mg/dL   Creatinine, Ser 1.48 (H) 0.61 - 1.24 mg/dL   Calcium 9.6 8.9 - 10.3 mg/dL   GFR, Estimated 45 (L) >60 mL/min   Anion gap 8 5 - 15  CBC     Status: Abnormal   Collection Time: 07/24/22  6:01 PM  Result Value Ref Range   WBC 10.9 (H) 4.0 - 10.5 K/uL   RBC 3.06 (L) 4.22 - 5.81 MIL/uL   Hemoglobin 10.6 (L) 13.0 - 17.0 g/dL   HCT 31.2 (L) 39.0 - 52.0 %   MCV 102.0 (H) 80.0 - 100.0 fL   MCH 34.6 (H) 26.0 - 34.0 pg   MCHC 34.0 30.0 - 36.0 g/dL   RDW 15.3 11.5 - 15.5 %   Platelets 275 150 - 400 K/uL   nRBC 0.0 0.0 - 0.2 %  Troponin I (High Sensitivity)     Status: None   Collection Time: 07/24/22  6:01 PM  Result Value Ref Range   Troponin I (High Sensitivity) 9 <18 ng/L  Brain natriuretic peptide     Status: None   Collection Time: 07/24/22  6:01 PM  Result Value Ref Range   B Natriuretic Peptide 54.7 0.0 - 100.0 pg/mL   Procalcitonin - Baseline     Status: None   Collection Time: 07/24/22  6:01 PM  Result  Value Ref Range   Procalcitonin 0.11 ng/mL  Troponin I (High Sensitivity)     Status: None   Collection Time: 07/24/22  9:30 PM  Result Value Ref Range   Troponin I (High Sensitivity) 10 <18 ng/L  SARS Coronavirus 2 by RT PCR (hospital order, performed in [place] hospital lab) *cepheid single result test* Anterior Nasal Swab     Status: None   Collection Time: 07/24/22 10:28 PM   Specimen: Anterior Nasal Swab  Result Value Ref Range   SARS Coronavirus 2 by RT PCR NEGATIVE NEGATIVE  Urinalysis, Routine w reflex microscopic     Status: Abnormal   Collection Time: 07/24/22 11:43 PM  Result Value Ref Range   Color, Urine YELLOW (A) YELLOW   APPearance CLEAR (A) CLEAR   Specific Gravity, Urine 1.016 1.005 - 1.030   pH 5.0 5.0 - 8.0   Glucose, UA NEGATIVE NEGATIVE mg/dL   Hgb urine dipstick NEGATIVE NEGATIVE   Bilirubin Urine NEGATIVE NEGATIVE   Ketones, ur NEGATIVE NEGATIVE mg/dL   Protein, ur NEGATIVE NEGATIVE mg/dL   Nitrite NEGATIVE NEGATIVE   Leukocytes,Ua NEGATIVE NEGATIVE    Unresulted Labs (From admission, onward)     Start     Ordered   07/25/22 0228  D-dimer, quantitative  ONCE - STAT,   STAT        07/25/22 0231   07/24/22 2255  Blood culture (single)  Once,   STAT        07/24/22 2255   07/24/22 2254  Respiratory (~20 pathogens) panel by PCR  (Respiratory panel by PCR (~20 pathogens, ~24 hr TAT)  w precautions)  Add-on,   AD        07/24/22 2253   07/24/22 2238  Blood culture (single)  Once,   STAT        07/24/22 2237           Pt has received : Orders Placed This Encounter  Procedures   1-3 Lead EKG Interpretation    This order was created via procedure documentation    Standing Status:   Standing    Number of Occurrences:   1   SARS Coronavirus 2 by RT PCR (hospital order, performed in [place] hospital lab) *cepheid single result test* Anterior Nasal Swab     Standing Status:   Standing    Number of Occurrences:   1   Blood culture (single)    Standing Status:   Standing    Number of Occurrences:   1   Respiratory (~20 pathogens) panel by PCR    Standing Status:   Standing    Number of Occurrences:   1   Blood culture (single)    Standing Status:   Standing    Number of Occurrences:   1   DG Chest 2 View    If patient pregnant, contact provider.    Standing Status:   Standing    Number of Occurrences:   1    Order Specific Question:   Reason for Exam (SYMPTOM  OR DIAGNOSIS REQUIRED)    Answer:   cp    Order Specific Question:   Radiology Contrast Protocol - do NOT remove file path    Answer:   [email or web address]   CT Chest Wo Contrast    Standing Status:   Standing    Number of Occurrences:   1   Basic metabolic panel    Standing Status:   Standing    Number of Occurrences:  1   CBC    Standing Status:   Standing    Number of Occurrences:   1   Brain natriuretic peptide    Standing Status:   Standing    Number of Occurrences:   1   Procalcitonin - Baseline    Standing Status:   Standing    Number of Occurrences:   1   Urinalysis, Routine w reflex microscopic    Standing Status:   Standing    Number of Occurrences:   1   D-dimer, quantitative    Standing Status:   Standing    Number of Occurrences:   1   Diet NPO time specified    Standing Status:   Standing    Number of Occurrences:   1   Document Height and Actual Weight    Use scales to weigh patient, not stated or estimated weight.    Standing Status:   Standing    Number of Occurrences:   1   Check Pulse Oximetry while ambulating    Standing Status:   Standing    Number of Occurrences:   1   Check Rectal Temperature    Standing Status:   Standing    Number of Occurrences:   1   Bed rest with bathroom privileges    Standing Status:   Standing    Number of Occurrences:   1   Apply Angina, Rule Out Myocardial  Infarction Care Plan    Standing Status:   Standing    Number of Occurrences:   1   Vital signs with O2 sat q4 hours x 24 hours, then q shift    Standing Status:   Standing    Number of Occurrences:   1   Cardiac Monitoring Continuous x 24 hours Indications for use: Other; other indications for use: chest pain    Standing Status:   Standing    Number of Occurrences:   1    Order Specific Question:   Indications for use:    Answer:   Other    Order Specific Question:   other indications for use:    Answer:   chest pain   RN may order Cardiology PRN Orders utilizing "Cardiology PRN medications" (through manage orders) for the following patient needs:    indigestion (Maalox), cough (Robitussin DM), constipation (MOM), diarrhea (Imodium), or minor skin irritation (Hydrocortisone Cream)    Standing Status:   Standing    Number of Occurrences:   [id number]   Full code    Standing Status:   Standing    Number of Occurrences:   1   Consult to hospitalist    Standing Status:   Standing    Number of Occurrences:   1    Order Specific Question:   Place call to:    Answer:   Hospitalist    Order Specific Question:   Reason for Consult    Answer:   Admit   Droplet and Contact precautions    Standing Status:   Standing    Number of Occurrences:   1   ED EKG    Standing Status:   Standing    Number of Occurrences:   1    Order Specific Question:   Reason for Exam    Answer:   Chest Pain   Repeat EKG    Standing Status:   Standing    Number of Occurrences:   1    Order Specific Question:   Reason  for Exam    Answer:   Shortness of breath   EKG 12-Lead (at 6am)    Standing Status:   Standing    Number of Occurrences:   1    Order Specific Question:   Notes    Answer:   chest pain   Admit to Inpatient (patient's expected length of stay will be greater than 2 midnights or inpatient only procedure)    Standing Status:   Standing    Number of Occurrences:   1    Order Specific Question:    Hospital Area    Answer:   [place] [[id number]]    Order Specific Question:   Level of Care    Answer:   Telemetry Cardiac [103]    Order Specific Question:   Covid Evaluation    Answer:   Asymptomatic - no recent exposure (last 10 days) testing not required    Order Specific Question:   Diagnosis    Answer:   Chest pain [825053]    Order Specific Question:   Admitting Physician    Answer:   Cherylann Ratel    Order Specific Question:   Attending Physician    Answer:   Cherylann Ratel    Order Specific Question:   Certification:    Answer:   I certify this patient will need inpatient services for at least 2 midnights    Order Specific Question:   Estimated Length of Stay    Answer:   2    Meds ordered this encounter  Medications   acetaminophen (TYLENOL) tablet 1,000 mg   DISCONTD: sodium chloride 0.9 % bolus 500 mL   cefTRIAXone (ROCEPHIN) 2 g in sodium chloride 0.9 % 100 mL IVPB    Order Specific Question:   Antibiotic Indication:    Answer:   CAP   azithromycin (ZITHROMAX) 500 mg in sodium chloride 0.9 % 250 mL IVPB   sodium chloride 0.9 % bolus 1,000 mL   amiodarone (PACERONE) tablet 100 mg   finasteride (PROSCAR) tablet 5 mg   levobunolol (BETAGAN) 0.5 % ophthalmic solution 1 drop   loratadine (CLARITIN) tablet 10 mg   pantoprazole (PROTONIX) EC tablet 40 mg    TAKE ONE (1) TABLET BY MOUTH TWO TIMES PER DAY     Rivaroxaban (XARELTO) tablet 15 mg   acetaminophen (TYLENOL) tablet 650 mg   ondansetron (ZOFRAN) injection 4 mg   cefTRIAXone (ROCEPHIN) 1 g in sodium chloride 0.9 % 100 mL IVPB    Order Specific Question:   Antibiotic Indication:    Answer:   CAP   azithromycin (ZITHROMAX) 500 mg in sodium chloride 0.9 % 250 mL IVPB        Admission Imaging : CT Chest Wo Contrast  Result Date: 07/24/2022 CLINICAL DATA:  Left-sided chest pain since this morning EXAM: CT CHEST WITHOUT CONTRAST TECHNIQUE: Multidetector CT imaging of the  chest was performed following the standard protocol without IV contrast. RADIATION DOSE REDUCTION: This exam was performed according to the departmental dose-optimization program which includes automated exposure control, adjustment of the mA and/or kV according to patient size and/or use of iterative reconstruction technique. COMPARISON:  07/24/2022, 09/08/2020 FINDINGS: Cardiovascular: Unenhanced imaging of the heart and great vessels demonstrates no pericardial effusion. There is calcification of the aortic and mitral valves. Diffuse atherosclerosis of the coronary vasculature unchanged. Normal caliber of the thoracic aorta. Stable atherosclerosis of the aortic arch and descending thoracic aorta. Evaluation of the vascular lumen  is limited without IV contrast. Mediastinum/Nodes: No enlarged mediastinal or axillary lymph nodes. Thyroid gland, trachea, and esophagus demonstrate no significant findings. Small hiatal hernia. Lungs/Pleura: No acute airspace disease, effusion, or pneumothorax. Mild background emphysema. Stable subpleural scarring at the lung bases. Stable benign 4 mm left lower lobe pulmonary nodule reference image 99/3, no specific follow-up is required given long-term stability. Central airways are patent. Upper Abdomen: No acute abnormality. Musculoskeletal: No acute or destructive bony lesions. Reconstructed images demonstrate no additional findings. IMPRESSION: 1. No acute intrathoracic process. 2. Stable emphysema and bibasilar scarring. 3. Aortic Atherosclerosis (ICD10-I70.0). Coronary artery atherosclerosis. Electronically Signed   By: Randa Ngo M.D.   On: 07/24/2022 23:08   DG Chest 2 View: Result Date: 07/24/2022 CLINICAL DATA:  Chest pain EXAM: CHEST - 2 VIEW COMPARISON:  04/27/2022 FINDINGS: Cardiac size is within normal limits. Possible coronary artery calcifications are seen. There are no signs of pulmonary edema or focal pulmonary consolidation. There is no pleural effusion or  pneumothorax. Patient's chin is partly obscuring the medial aspects of both apices. IMPRESSION: There are no signs of pulmonary edema or focal pulmonary consolidation. Electronically Signed   By: Elmer Picker M.D.   On: 07/24/2022 18:23   Physical Examination: Vitals:   07/25/22 0100 07/25/22 0130 07/25/22 0140 07/25/22 0200  BP: 125/73 125/67  126/65  Pulse: (!) 101 98  96  Temp:   99.6 F (37.6 C)   Resp: '16 10  16  '$ SpO2: 94% 94%  95%  TempSrc:   Oral    Physical Exam Vitals and nursing note reviewed.  Constitutional:      General: He is not in acute distress.    Appearance: Normal appearance. He is not ill-appearing, toxic-appearing or diaphoretic.  HENT:     Head: Normocephalic and atraumatic.     Right Ear: Hearing and external ear normal.     Left Ear: Hearing and external ear normal.     Nose: Nose normal. No nasal deformity.     Mouth/Throat:     Lips: Pink.     Mouth: Mucous membranes are moist.     Tongue: No lesions.     Pharynx: Oropharynx is clear.  Eyes:     Extraocular Movements: Extraocular movements intact.     Pupils: Pupils are equal, round, and reactive to light.  Neck:     Vascular: No carotid bruit.  Cardiovascular:     Rate and Rhythm: Normal rate and regular rhythm.     Pulses: Normal pulses.     Heart sounds: Normal heart sounds.  Pulmonary:     Effort: Pulmonary effort is normal.     Breath sounds: Wheezing present.  Abdominal:     General: Bowel sounds are normal. There is no distension.     Palpations: Abdomen is soft. There is no mass.     Tenderness: There is no abdominal tenderness. There is no guarding.     Hernia: No hernia is present.  Musculoskeletal:     Right lower leg: No edema.     Left lower leg: No edema.  Skin:    General: Skin is warm.  Neurological:     General: No focal deficit present.     Mental Status: He is alert and oriented to person, place, and time.     Cranial Nerves: Cranial nerves 2-12 are intact.      Motor: Motor function is intact.  Psychiatric:        Attention and Perception: Attention  normal.        Mood and Affect: Mood normal.        Speech: Speech normal.        Behavior: Behavior normal. Behavior is cooperative.        Cognition and Memory: Cognition normal.   Assessment and Plan: * Chest pain Suspect atypical chest pain. We will get v/q scan in am. Lab Results  Component Value Date   CREATININE 1.48 (H) 07/24/2022   CREATININE 1.55 (H) 05/13/2022   CREATININE 1.34 (H) 04/28/2022  PRN NTG. New lafb we will repeat and get cardiology consult  per am team.    Coronary artery disease Cont amiodarone / lisinopril / NTG>    Paroxysmal atrial fibrillation ([place]) Currently in sinus rhythm. Will continue patient on amiodarone.  GERD (gastroesophageal reflux disease) Iv ppi.   Hypertension Vitals:   07/24/22 1802 07/24/22 2130 07/24/22 2200 07/24/22 2335  BP: 125/81 (!) 147/83 (!) 147/82 128/80   07/25/22 0000 07/25/22 0030 07/25/22 0100 07/25/22 0130  BP: (!) 142/98 131/75 125/73 125/67  Continue pravastatin. Continue amiodarone. Marland Kitchen   History of pulmonary embolism Pt on xarelto and we will continue xarelto. Dimer.     DVT prophylaxis:  Xarelto    Code Status:  Full code    Family Communication:  Mirl, Hillery (Spouse)  [phone]   Disposition Plan:  Home   Consults called:  None  Admission status: Inpatient   Unit/ Expected LOS: 2-3 days    Para Skeans MD [facility]  6 PM- 2 AM. Please contact me via secure Chat 6 PM-2 AM. [phone]( Pager ) To contact the [facility] Attending or Consulting provider [place] or covering provider during after hours [place], for this patient.   Check the care team in [facility] and look for a) attending/consulting [facility] provider listed and b) the [facility] team listed Log into [email or web address] and use [place]'s universal password to access. If you do not have the password, please contact the hospital  operator. Locate the [facility] provider you are looking for under [facility] and page to a number that you can be directly reached. If you still have difficulty reaching the provider, please page the [facility] (Director on Call) for the Hospitalists listed on amion for assistance. [email or web address] 07/25/2022, 2:31 AM

## 2022-07-25 NOTE — ED Notes (Signed)
Pt's wife at RN station stating pt only ate a few bites of lunch and requesting Ginger Ale. Spoke with pt and he states "just not hungry right now."

## 2022-07-25 NOTE — Progress Notes (Signed)
Consultation Progress Note   Patient: William Taylor UYQ:[id number] DOB: 12-22-1929 DOA: 07/24/2022 DOS: the patient was seen and examined on 07/25/2022 Primary service: Dalina Samara, Manfred Shirts, MD  Brief hospital course: 88 y.o. male coming to the ED with complaints of chest pain, left-sided. Has felt shaky and ill.  HPI as per chart review and ED MD note patient is alert oriented And gives minimal history.  Wife at bedside in the ED. chest CT done in the emergency room without contrast Shows no acute intrathoracic process stable emphysema and aortic atherosclerosis and coronary artery atherosclerosis. Unclear etiology for infection.  In the emergency room patient received antibiotic coverage for CAP with presumed pneumonia  Assessment and Plan: * Chest pain EKG shows no ST wave changes, Troponins are negative Reviewed previous Cath- Mild stenosis noted, no intervention at the time Consult Cardiology.  Suspected PNA -Started on Rocephin and Azithromycin  Coronary artery disease Cont amiodarone / lisinopril / NTG>   Paroxysmal atrial fibrillation ([facility]) Currently in sinus rhythm. Will continue patient on amiodarone.  GERD (gastroesophageal reflux disease) Iv ppi.  Hypertension Continue pravastatin. Continue amiodarone. Marland Kitchen History of pulmonary embolism Pt on xarelto and we will continue xarelto.         [facility] will continue to follow the patient.  Subjective: Continues to have left sided  chest pain with associated nausea.    Physical Exam: Vitals:   07/25/22 0800 07/25/22 0830 07/25/22 0900 07/25/22 1000  BP: 126/64 (!) 115/56 (!) 155/87 138/84  Pulse: 84 83 91 88  Resp: '17 19 17 14  '$ Temp:      TempSrc:      SpO2: 97% 95% 98% 96%   Vitals and nursing note reviewed.  Constitutional:      General: He is not in acute distress.    Appearance: Normal appearance. He is not ill-appearing, toxic-appearing or diaphoretic.  HENT:     Head: Normocephalic and atraumatic.      Right Ear: Hearing and external ear normal.     Left Ear: Hearing and external ear normal.     Nose: Nose normal. No nasal deformity.     Mouth/Throat:     Lips: Pink.     Mouth: Mucous membranes are moist.     Tongue: No lesions.     Pharynx: Oropharynx is clear.  Eyes:     Extraocular Movements: Extraocular movements intact.     Pupils: Pupils are equal, round, and reactive to light.  Neck:     Vascular: No carotid bruit.  Cardiovascular:     Rate and Rhythm: Normal rate and regular rhythm.     Pulses: Normal pulses.     Heart sounds: Normal heart sounds.  Pulmonary:     Effort: Pulmonary effort is normal.     Breath sounds: Diminshed Abdominal:     General: Bowel sounds are normal. There is no distension.     Palpations: Abdomen is soft. There is no mass.     Tenderness: There is no abdominal tenderness. There is no guarding.     Hernia: No hernia is present.  Musculoskeletal:     Right lower leg: No edema.     Left lower leg: No edema.  Skin:    General: Skin is warm.  Neurological:     General: No focal deficit present.     Mental Status: He is alert and oriented to person, place, and time.     Cranial Nerves: Cranial nerves 2-12 are intact.  Motor: Motor function is intact.  Psychiatric:        Attention and Perception: Attention normal.        Mood and Affect: Mood normal.        Speech: Speech normal.        Behavior: Behavior normal. Behavior is cooperative.        Cognition and Memory: Cognition normal.  Data Reviewed:  There are no new results to review at this time.  Family Communication: None at bedside  Time spent: 15 minutes.  Author: Cristela Felt, MD 07/25/2022 11:53 AM  For on call review [email or web address].

## 2022-07-25 NOTE — Telephone Encounter (Signed)
Pt was sent to access nurse due to phone note below:   Bud Face F17 hours ago (5:23 PM)   VW Access nurse called back stating the patient did not want to go to the ED. She transferred the patient's wife over to me. I talked with her and the patient . After the patient thought it over he stated he would go to the ED.       Note    Burt Knack D routed conversation to Suzanna Obey, [place] hours ago (4:54 PM)   Burt Knack D17 hours ago (4:54 PM)   AD Pt wife called stating the pt is cold, his chest hurt and he is achy. Sent to access nurse       Note   [place], [place] [phone]  Burt Knack D17 hours ago (4:53 PM)    After looking in pts chart he did go to the ED on yetserday. I  tried to reach wife but was unable to as the ED note says they are still admitted pt has not been discharged. Find access nurse note below:

## 2022-07-25 NOTE — Assessment & Plan Note (Signed)
Suspect atypical chest pain. We will get v/q scan in am. Lab Results  Component Value Date   CREATININE 1.48 (H) 07/24/2022   CREATININE 1.55 (H) 05/13/2022   CREATININE 1.34 (H) 04/28/2022  PRN NTG. New lafb we will repeat and get cardiology consult  per am team.

## 2022-07-25 NOTE — Assessment & Plan Note (Signed)
Vitals:   07/24/22 1802 07/24/22 2130 07/24/22 2200 07/24/22 2335  BP: 125/81 (!) 147/83 (!) 147/82 128/80   07/25/22 0000 07/25/22 0030 07/25/22 0100 07/25/22 0130  BP: (!) 142/98 131/75 125/73 125/67  Continue pravastatin. Continue amiodarone. Marland Kitchen

## 2022-07-25 NOTE — ED Notes (Signed)
Patient transitioned to hospital bed to promote comfort. Patient resting in bed free from sign of distress. Breathing unlabored speaking in full sentences with symmetric chest rise and fall. Bed low and locked with side rails raised x2. Call bell in reach and monitor in place.

## 2022-07-25 NOTE — Telephone Encounter (Signed)
Noted  

## 2022-07-25 NOTE — Assessment & Plan Note (Signed)
Pt on xarelto and we will continue xarelto. Dimer.

## 2022-07-25 NOTE — Telephone Encounter (Signed)
Access nurse note has been sent to pts PCP and Doc of the day has been CC'd

## 2022-07-25 NOTE — Assessment & Plan Note (Signed)
Cont amiodarone / lisinopril / NTG>

## 2022-07-26 ENCOUNTER — Other Ambulatory Visit: Payer: Self-pay | Admitting: Urology

## 2022-07-26 ENCOUNTER — Other Ambulatory Visit: Payer: Self-pay

## 2022-07-26 LAB — BASIC METABOLIC PANEL
Anion gap: 7 (ref 5–15)
BUN: 21 mg/dL (ref 8–23)
CO2: 21 mmol/L — ABNORMAL LOW (ref 22–32)
Calcium: 9.1 mg/dL (ref 8.9–10.3)
Chloride: 111 mmol/L (ref 98–111)
Creatinine, Ser: 1.11 mg/dL (ref 0.61–1.24)
GFR, Estimated: >60 mL/min (ref >60)
Glucose, Bld: 99 mg/dL (ref 70–99)
Potassium: 4.1 mmol/L (ref 3.5–5.1)
Sodium: 139 mmol/L (ref 135–145)

## 2022-07-26 LAB — CBC
HCT: 29.5 % — ABNORMAL LOW (ref 39.0–52.0)
Hemoglobin: 9.8 g/dL — ABNORMAL LOW (ref 13.0–17.0)
MCH: 33.4 pg (ref 26.0–34.0)
MCHC: 33.2 g/dL (ref 30.0–36.0)
MCV: 100.7 fL — ABNORMAL HIGH (ref 80.0–100.0)
Platelets: 204 10*3/uL (ref 150–400)
RBC: 2.93 MIL/uL — ABNORMAL LOW (ref 4.22–5.81)
RDW: 16.3 % — ABNORMAL HIGH (ref 11.5–15.5)
WBC: 5.5 10*3/uL (ref 4.0–10.5)
nRBC: 0 % (ref 0.0–0.2)

## 2022-07-26 MED ORDER — OSELTAMIVIR PHOSPHATE 75 MG PO CAPS
75.0000 mg | ORAL_CAPSULE | Freq: Two times a day (BID) | ORAL | Status: DC
  Administered 2022-07-26 (×2): 75 mg via ORAL
  Filled 2022-07-26 (×4): qty 1

## 2022-07-26 MED ORDER — PHENOL 1.4 % MT LIQD
1.0000 | OROMUCOSAL | Status: DC | PRN
  Administered 2022-07-26 (×2): 1 via OROMUCOSAL
  Filled 2022-07-26: qty 177

## 2022-07-26 MED ORDER — SODIUM CHLORIDE 0.9 % IV SOLN: INTRAVENOUS | Status: DC

## 2022-07-26 MED ORDER — IPRATROPIUM-ALBUTEROL 0.5-2.5 (3) MG/3ML IN SOLN
3.0000 mL | RESPIRATORY_TRACT | Status: DC | PRN
  Administered 2022-07-27: 3 mL via RESPIRATORY_TRACT
  Filled 2022-07-26: qty 3

## 2022-07-26 NOTE — ED Notes (Signed)
Report from [place], rn

## 2022-07-26 NOTE — ED Notes (Signed)
Pt requesting cepachol throat spray, complains of sore throat, message sent to brenda morrison, np to notify.

## 2022-07-26 NOTE — ED Notes (Signed)
Report to alaina, rn.

## 2022-07-26 NOTE — Telephone Encounter (Signed)
Called patient back again and left him another voicemail to call me back.

## 2022-07-26 NOTE — ED Notes (Signed)
Pt provided with ice cream per request by melody, ed tech.

## 2022-07-26 NOTE — Progress Notes (Signed)
Consultation Progress Note   Patient: William Taylor MVE:[id number] DOB: 1930-03-29 DOA: 07/24/2022 DOS: the patient was seen and examined on 07/26/2022 Primary service: Raeonna Milo, Manfred Shirts, MD  Brief hospital course: 88 y.o. male coming to the ED with complaints of chest pain, left-sided. Has felt shaky and ill.  HPI as per chart review and ED MD note patient is alert oriented And gives minimal history.  Wife at bedside in the ED. chest CT done in the emergency room without contrast Shows no acute intrathoracic process stable emphysema and aortic atherosclerosis and coronary artery atherosclerosis. Unclear etiology for infection.  In the emergency room patient received antibiotic coverage for CAP with presumed pneumonia. Patient found to have Influenza per respiratory panel  Assessment and Plan:  Consultation Progress Note    Patient: William Taylor GEZ:[id number] DOB: Jun 09, 1930 DOA: 07/24/2022 DOS: the patient was seen and examined on 07/25/2022 Primary service: Loany Neuroth, Manfred Shirts, MD   Brief hospital course: 88 y.o. male coming to the ED with complaints of chest pain, left-sided. Has felt shaky and ill.  HPI as per chart review and ED MD note patient is alert oriented And gives minimal history.  Wife at bedside in the ED. chest CT done in the emergency room without contrast Shows no acute intrathoracic process stable emphysema and aortic atherosclerosis and coronary artery atherosclerosis. Unclear etiology for infection.  In the emergency room patient received antibiotic coverage for CAP with presumed pneumonia   Assessment and Plan: * Chest pain Likely pleuritic, low suspicion for ACS EKG shows no ST wave changes, Troponins are negative  Influenza Started on Tamiflu IV fluids Supportive care    Coronary artery disease Cont amiodarone / lisinopril / NTG>    Paroxysmal atrial fibrillation (HCC) Currently in sinus rhythm. Will continue patient on amiodarone.   GERD  (gastroesophageal reflux disease) Iv ppi.   Hypertension Continue pravastatin. Continue amiodarone. Marland Kitchen History of pulmonary embolism Pt on xarelto and we will continue xarelto.              TRH will continue to follow the patient.  Subjective: Feels very tired and fatigued, Poor appetite, ongoing sore throat.  Physical Exam: Vitals:   07/26/22 0530 07/26/22 0600 07/26/22 0605 07/26/22 0950  BP: (!) 140/75 124/69  (!) 156/86  Pulse: 81 80 86 86  Resp: 15 (!) '23 17 17  '$ Temp:    98.6 F (37 C)  TempSrc:    Oral  SpO2: 93% 91% 91% 97%  Vitals and nursing note reviewed.  Constitutional:      General: He is not in acute distress.    Appearance: Normal appearance. He is not ill-appearing, toxic-appearing or diaphoretic.  HENT:     Head: Normocephalic and atraumatic.     Right Ear: Hearing and external ear normal.     Left Ear: Hearing and external ear normal.     Nose: Nose normal. No nasal deformity.     Mouth/Throat:     Lips: Pink.     Mouth: Mucous membranes are moist.     Tongue: No lesions.     Pharynx: Oropharynx is clear.  Eyes:     Extraocular Movements: Extraocular movements intact.     Pupils: Pupils are equal, round, and reactive to light.  Neck:     Vascular: No carotid bruit.  Cardiovascular:     Rate and Rhythm: Normal rate and regular rhythm.     Pulses: Normal pulses.     Heart sounds: Normal heart sounds.  Pulmonary:     Effort: Pulmonary effort is normal.     Breath sounds: Diminshed Abdominal:     General: Bowel sounds are normal. There is no distension.     Palpations: Abdomen is soft. There is no mass.     Tenderness: There is no abdominal tenderness. There is no guarding.     Hernia: No hernia is present.  Musculoskeletal:     Right lower leg: No edema.     Left lower leg: No edema.  Skin:    General: Skin is warm.  Neurological:     General: No focal deficit present.     Mental Status: He is alert and oriented to person, place, and  time.     Cranial Nerves: Cranial nerves 2-12 are intact.     Motor: Motor function is intact.  Psychiatric:        Attention and Perception: Attention normal.        Mood and Affect: Mood normal.        Speech: Speech normal.        Behavior: Behavior normal. Behavior is cooperative.        Cognition and Memory: Cognition normal.   Data Reviewed:  There are no new results to review at this time.  Family Communication: Wife at bedside  Time spent: 16 minutes.  Author: Cristela Felt, MD 07/26/2022 1:10 PM  For on call review [email or web address].

## 2022-07-27 DIAGNOSIS — R079 Chest pain, unspecified: Secondary | ICD-10-CM | POA: Diagnosis not present

## 2022-07-27 MED ORDER — LISINOPRIL 10 MG PO TABS
10.0000 mg | ORAL_TABLET | Freq: Every day | ORAL | Status: DC
  Administered 2022-07-27: 10 mg via ORAL
  Filled 2022-07-27: qty 1

## 2022-07-27 MED ORDER — OSELTAMIVIR PHOSPHATE 30 MG PO CAPS: 30.0000 mg | ORAL_CAPSULE | Freq: Two times a day (BID) | ORAL | 0 refills | Status: AC

## 2022-07-27 MED ORDER — COMBIVENT RESPIMAT 20-100 MCG/ACT IN AERS: 1.0000 | INHALATION_SPRAY | Freq: Four times a day (QID) | RESPIRATORY_TRACT | 1 refills | Status: DC

## 2022-07-27 MED ORDER — PRAVASTATIN SODIUM 40 MG PO TABS: 40.0000 mg | ORAL_TABLET | Freq: Every day | ORAL | Status: DC

## 2022-07-27 MED ORDER — OSELTAMIVIR PHOSPHATE 30 MG PO CAPS
30.0000 mg | ORAL_CAPSULE | Freq: Two times a day (BID) | ORAL | Status: DC
  Administered 2022-07-27: 30 mg via ORAL
  Filled 2022-07-27: qty 1

## 2022-07-27 MED ORDER — GUAIFENESIN-DM 100-10 MG/5ML PO SYRP: 5.0000 mL | ORAL_SOLUTION | ORAL | 0 refills | Status: DC | PRN

## 2022-07-27 MED ORDER — AZITHROMYCIN 250 MG PO TABS: ORAL_TABLET | ORAL | 0 refills | Status: DC

## 2022-07-27 NOTE — Discharge Summary (Signed)
Physician Discharge Summary   Patient: William Taylor MRN: [id number] DOB: [birth year]  Admit date:     07/24/2022  Discharge date: 07/27/22  Discharge Physician: Lorella Nimrod   PCP: [name], MD   Recommendations at discharge:  Please obtain CBC and BMP in 1 week Follow-up with primary care provider within a week Please ensure the completion of Zithromax and Tamiflu  Discharge Diagnoses: Principal Problem:   Chest pain Active Problems:   Coronary artery disease   Paroxysmal atrial fibrillation (HCC)   GERD (gastroesophageal reflux disease)   Hypertension   History of pulmonary embolism   Hospital Course: William Taylor is a 88 y.o. male  here with weakness, cough, SOB. Pt states that over hte past several days he has felt "awful." Reports he has had cough, SOB, and fatigue and has had difficulty even getting around the house today due to this. He has had a mild cough for a week or so but this worsened over past few days, along with a dull, aching chest pressure which felt similar to his prior MI. He states that while the chest pain has been somewhat recurrent, the cough, SOB, and fatigue is new. No nausea, but he has not had much of an appetite. No abdominal pain. No specific sick contacts.   CT done in the emergency room without contrast Shows no acute intrathoracic process stable emphysema and aortic atherosclerosis and coronary artery atherosclerosis.  Influenza PCR positive so he was started on Tamiflu.  Dose was adjusted based on age and GFR. Patient also received ceftriaxone and Zithromax for the concern of CAP.  Procalcitonin negative and no radiologic evidence of pneumonia.  Patient will complete a course of Zithromax for its anti-inflammatory effects on lungs.  Patient initially had mild AKI, most likely secondary to poor p.o. intake which was improved before discharge. Home lisinopril was initially held and restarted once renal function back to  baseline.  Patient was also given Combivent inhaler to be used as needed. He was given Robitussin DM and was advised to use Tylenol as needed for fever and bodyaches.  Patient remained stable on room air.  He would like to go home and is being discharged on Tamiflu and Zithromax for 3 more days along with supportive care.  He will continue with rest of his home medications and need to have a close follow-up with his primary care provider for further recommendations.   Assessment and Plan: * Chest pain Atypical chest pain, EKG without any acute changes and troponin remain negative.   Coronary artery disease. No concern of ACS, troponin remain negative and EKG without any acute change Cont home statin  Paroxysmal atrial fibrillation (HCC) Currently in sinus rhythm. Will continue  amiodarone.  GERD (gastroesophageal reflux disease) -Continue PPI.   Hypertension Home lisinopril was initially held due to concern of AKI.  Blood pressure elevated -Restart and continue home lisinopril  History of pulmonary embolism Pt on xarelto and we will continue xarelto.   Consultants: None Procedures performed: None Disposition: Home Diet recommendation:  Discharge Diet Orders (From admission, onward)     Start     Ordered   07/27/22 0000  Diet - low sodium heart healthy        07/27/22 1314           Cardiac diet DISCHARGE MEDICATION: Allergies as of 07/27/2022       Reactions   Bee Venom Swelling   Sulfasalazine Hives, Swelling   Sulfa Antibiotics Hives,  Swelling   Warfarin    Warfarin And Related Other (See Comments)   Chest pain        Medication List     TAKE these medications    amiodarone 100 MG tablet Commonly known as: PACERONE Take 1 tablet (100 mg total) by mouth daily.   azithromycin 250 MG tablet Commonly known as: ZITHROMAX Take 1 tablet daily for the next 3 days   Combivent Respimat 20-100 MCG/ACT Aers respimat Generic drug:  Ipratropium-Albuterol Inhale 1 puff into the lungs every 6 (six) hours.   fenofibrate micronized 130 MG capsule Commonly known as: ANTARA TAKE 1 CAPSULE BY MOUTH  DAILY BEFORE BREAKFAST   finasteride 5 MG tablet Commonly known as: PROSCAR Take 1 tablet (5 mg total) by mouth daily.   guaiFENesin-dextromethorphan 100-10 MG/5ML syrup Commonly known as: ROBITUSSIN DM Take 5 mLs by mouth every 4 (four) hours as needed for cough.   levobunolol 0.5 % ophthalmic solution Commonly known as: BETAGAN Place 1 drop into the right eye 2 (two) times daily.   lisinopril 10 MG tablet Commonly known as: ZESTRIL Take 1 tablet (10 mg total) by mouth daily.   loratadine 10 MG tablet Commonly known as: CLARITIN Take 1 tablet (10 mg total) by mouth daily.   MULTIVITAMIN PO Take 1 tablet by mouth daily.   nitroGLYCERIN 0.4 MG SL tablet Commonly known as: NITROSTAT DISSOLVE UNDER THE TONGUE 1 TABLET EVERY 5 MINUTES AS NEEDED FOR CHEST PAIN   oseltamivir 30 MG capsule Commonly known as: TAMIFLU Take 1 capsule (30 mg total) by mouth 2 (two) times daily for 5 days.   pantoprazole 40 MG tablet Commonly known as: PROTONIX TAKE ONE (1) TABLET BY MOUTH TWO TIMES PER DAY   pravastatin 40 MG tablet Commonly known as: PRAVACHOL Take 1 tablet (40 mg total) by mouth at bedtime.   Rivaroxaban 15 MG Tabs tablet Commonly known as: Xarelto Take 1 tablet (15 mg total) by mouth daily.        Follow-up Information     [name], MD. Schedule an appointment as soon as possible for a visit in 1 week(s).   Specialty: Family Medicine Contact information: [place] [address] [phone]                Discharge Exam: Danley Danker Weights   07/26/22 1622  Weight: 92.6 kg   General.  Frail elderly gentleman, in no acute distress. Pulmonary.  Lungs clear bilaterally, normal respiratory effort. CV.  Regular rate and rhythm, no JVD, rub or murmur. Abdomen.   Soft, nontender, nondistended, BS positive. CNS.  Alert and oriented .  No focal neurologic deficit. Extremities.  No edema, no cyanosis, pulses intact and symmetrical. Psychiatry.  Judgment and insight appears normal.   Condition at discharge: stable  The results of significant diagnostics from this hospitalization (including imaging, microbiology, ancillary and laboratory) are listed below for reference.   Imaging Studies: CT Chest Wo Contrast  Result Date: 07/24/2022 CLINICAL DATA:  Left-sided chest pain since this morning EXAM: CT CHEST WITHOUT CONTRAST TECHNIQUE: Multidetector CT imaging of the chest was performed following the standard protocol without IV contrast. RADIATION DOSE REDUCTION: This exam was performed according to the departmental dose-optimization program which includes automated exposure control, adjustment of the mA and/or kV according to patient size and/or use of iterative reconstruction technique. COMPARISON:  07/24/2022, 09/08/2020 FINDINGS: Cardiovascular: Unenhanced imaging of the heart and great vessels demonstrates no pericardial effusion. There is calcification of  the aortic and mitral valves. Diffuse atherosclerosis of the coronary vasculature unchanged. Normal caliber of the thoracic aorta. Stable atherosclerosis of the aortic arch and descending thoracic aorta. Evaluation of the vascular lumen is limited without IV contrast. Mediastinum/Nodes: No enlarged mediastinal or axillary lymph nodes. Thyroid gland, trachea, and esophagus demonstrate no significant findings. Small hiatal hernia. Lungs/Pleura: No acute airspace disease, effusion, or pneumothorax. Mild background emphysema. Stable subpleural scarring at the lung bases. Stable benign 4 mm left lower lobe pulmonary nodule reference image 99/3, no specific follow-up is required given long-term stability. Central airways are patent. Upper Abdomen: No acute abnormality. Musculoskeletal: No acute or destructive bony  lesions. Reconstructed images demonstrate no additional findings. IMPRESSION: 1. No acute intrathoracic process. 2. Stable emphysema and bibasilar scarring. 3. Aortic Atherosclerosis (ICD10-I70.0). Coronary artery atherosclerosis. Electronically Signed   By: Randa Ngo M.D.   On: 07/24/2022 23:08   DG Chest 2 View  Result Date: 07/24/2022 CLINICAL DATA:  Chest pain EXAM: CHEST - 2 VIEW COMPARISON:  04/27/2022 FINDINGS: Cardiac size is within normal limits. Possible coronary artery calcifications are seen. There are no signs of pulmonary edema or focal pulmonary consolidation. There is no pleural effusion or pneumothorax. Patient's chin is partly obscuring the medial aspects of both apices. IMPRESSION: There are no signs of pulmonary edema or focal pulmonary consolidation. Electronically Signed   By: Elmer Picker M.D.   On: 07/24/2022 18:23    Microbiology: Results for orders placed or performed during the hospital encounter of 07/24/22  SARS Coronavirus 2 by RT PCR (hospital order, performed in [facility] hospital lab) *cepheid single result test* Anterior Nasal Swab     Status: None   Collection Time: 07/24/22 10:28 PM   Specimen: Anterior Nasal Swab  Result Value Ref Range Status   SARS Coronavirus 2 by RT PCR NEGATIVE NEGATIVE Final    Comment: (NOTE) SARS-CoV-2 target nucleic acids are NOT DETECTED.  The SARS-CoV-2 RNA is generally detectable in upper and lower respiratory specimens during the acute phase of infection. The lowest concentration of SARS-CoV-2 viral copies this assay can detect is 250 copies / mL. A negative result does not preclude SARS-CoV-2 infection and should not be used as the sole basis for treatment or other patient management decisions.  A negative result may occur with improper specimen collection / handling, submission of specimen other than nasopharyngeal swab, presence of viral mutation(s) within the areas targeted by this assay, and inadequate  number of viral copies (<250 copies / mL). A negative result must be combined with clinical observations, patient history, and epidemiological information.  Fact Sheet for Patients:   https://www.patel.info/  Fact Sheet for Healthcare Providers: https://hall.com/  This test is not yet approved or  cleared by the [place] FDA and has been authorized for detection and/or diagnosis of SARS-CoV-2 by FDA under an Emergency Use Authorization (EUA).  This EUA will remain in effect (meaning this test can be used) for the duration of the COVID-19 declaration under Section 564(b)(1) of the Act, 21 U.S.C. section 360bbb-3(b)(1), unless the authorization is terminated or revoked sooner.  Performed at [facility], [place], [place] [address]   Respiratory (~20 pathogens) panel by PCR     Status: Abnormal   Collection Time: 07/24/22 10:55 PM   Specimen: Nasopharyngeal Swab; Respiratory  Result Value Ref Range Status   Adenovirus NOT DETECTED NOT DETECTED Final   Coronavirus 229E NOT DETECTED NOT DETECTED Final    Comment: (NOTE) The Coronavirus on  the Respiratory Panel, DOES NOT test for the novel  Coronavirus (2019 nCoV)    Coronavirus HKU1 NOT DETECTED NOT DETECTED Final   Coronavirus NL63 NOT DETECTED NOT DETECTED Final   Coronavirus OC43 NOT DETECTED NOT DETECTED Final   Metapneumovirus NOT DETECTED NOT DETECTED Final   Rhinovirus / Enterovirus NOT DETECTED NOT DETECTED Final   Influenza A H1 2009 DETECTED (A) NOT DETECTED Final   Influenza B NOT DETECTED NOT DETECTED Final   Parainfluenza Virus 1 NOT DETECTED NOT DETECTED Final   Parainfluenza Virus 2 NOT DETECTED NOT DETECTED Final   Parainfluenza Virus 3 NOT DETECTED NOT DETECTED Final   Parainfluenza Virus 4 NOT DETECTED NOT DETECTED Final   Respiratory Syncytial Virus NOT DETECTED NOT DETECTED Final   Bordetella pertussis NOT DETECTED NOT DETECTED Final    Bordetella Parapertussis NOT DETECTED NOT DETECTED Final   Chlamydophila pneumoniae NOT DETECTED NOT DETECTED Final   Mycoplasma pneumoniae NOT DETECTED NOT DETECTED Final    Comment: Performed at [facility], [place] [address] [place], [place] [address]  Blood culture (single)     Status: None (Preliminary result)   Collection Time: 07/24/22 11:43 PM   Specimen: BLOOD  Result Value Ref Range Status   Specimen Description BLOOD BLOOD RIGHT HAND  Final   Special Requests Blood Culture adequate volume  Final   Culture   Final    NO GROWTH 3 DAYS Performed at [facility], [address], [place], [place] [address]    Report Status PENDING  Incomplete  Blood culture (single)     Status: None (Preliminary result)   Collection Time: 07/24/22 11:43 PM   Specimen: BLOOD  Result Value Ref Range Status   Specimen Description BLOOD BLOOD RIGHT FOREARM  Final   Special Requests Blood Culture adequate volume  Final   Culture   Final    NO GROWTH 2 DAYS Performed at [facility], [address] [place], [place] [address]    Report Status PENDING  Incomplete    Labs: CBC: Recent Labs  Lab 07/24/22 1801 07/26/22 0759  WBC 10.9* 5.5  HGB 10.6* 9.8*  HCT 31.2* 29.5*  MCV 102.0* 100.7*  PLT 275 213   Basic Metabolic Panel: Recent Labs  Lab 07/24/22 1801 07/26/22 0759  NA 140 139  K 4.1 4.1  CL 110 111  CO2 22 21*  GLUCOSE 121* 99  BUN 30* 21  CREATININE 1.48* 1.11  CALCIUM 9.6 9.1   Liver Function Tests: No results for input(s): "AST", "ALT", "ALKPHOS", "BILITOT", "PROT", "ALBUMIN" in the last 168 hours. CBG: No results for input(s): "GLUCAP" in the last 168 hours.  Discharge time spent: greater than 30 minutes.  This record has been created using Systems analyst. Errors have been sought and corrected,but may not always be located. Such creation errors do not reflect on the standard of care.   Signed: Lorella Nimrod, MD [facility] 07/27/2022

## 2022-07-27 NOTE — Progress Notes (Signed)
PHARMACY NOTE:  ANTIMICROBIAL RENAL DOSAGE ADJUSTMENT  Current antimicrobial regimen includes a mismatch between antimicrobial dosage and estimated renal function.  As per policy approved by the Pharmacy & Therapeutics and Medical Executive Committees, the antimicrobial dosage will be adjusted accordingly.  Current antimicrobial dosage: Tamiflu 75 mg BID  Indication: Influenza treatment  Renal Function:  Estimated Creatinine Clearance: 54.3 mL/min (by C-G formula based on SCr of 1.11 mg/dL).    Antimicrobial dosage has been changed to: Tamiflu 30 mg BID  Additional comments: CrCl > 30 to < 60. Dose modified per recommendations. Scr yesterday of 1.11 appears better than usual, appears to run 1.3 - 1.5.   Thank you for allowing pharmacy to be a part of this patient's care.  Benita Gutter, [facility] 07/27/2022 8:04 AM

## 2022-07-27 NOTE — Plan of Care (Signed)

## 2022-07-29 ENCOUNTER — Telehealth: Payer: Self-pay

## 2022-07-29 LAB — CULTURE, BLOOD (SINGLE)
Culture: NO GROWTH
Special Requests: ADEQUATE

## 2022-07-29 NOTE — Telephone Encounter (Signed)
Reviewed

## 2022-07-29 NOTE — Telephone Encounter (Signed)
Transition Care Management Follow-up Telephone Call Date of discharge and from where: [facility] [place] 07/27/2022 How have you been since you were released from the hospital? bette Any questions or concerns? No  Items Reviewed: Did the pt receive and understand the discharge instructions provided? Yes  Medications obtained and verified? Yes  Other? No  Any new allergies since your discharge? No  Dietary orders reviewed? Yes Do you have support at home? Yes   Home Care and Equipment/Supplies: Were home health services ordered? no If so, what is the name of the agency? N/a  Has the agency set up a time to come to the patient's home? not applicable Were any new equipment or medical supplies ordered?  No What is the name of the medical supply agency? N/a Were you able to get the supplies/equipment? not applicable Do you have any questions related to the use of the equipment or supplies? No  Functional Questionnaire: (I = Independent and D = Dependent) ADLs: I  Bathing/Dressing- I  Meal Prep- I  Eating- I  Maintaining continence- I  Transferring/Ambulation- I  Managing Meds- I  Follow up appointments reviewed:  PCP Hospital f/u appt confirmed? Yes  Scheduled to see Dr Caryl Bis on 08/06/2022 @ 10:30. [facility] f/u appt confirmed? No  Are transportation arrangements needed? No  If their condition worsens, is the pt aware to call PCP or go to the Emergency Dept.? Yes Was the patient provided with contact information for the PCP's office or ED? Yes Was to pt encouraged to call back with questions or concerns? Yes  Juanda Crumble, LPN [place] Direct Dial [phone]

## 2022-07-31 LAB — CULTURE, BLOOD (SINGLE)
Culture: NO GROWTH
Special Requests: ADEQUATE

## 2022-08-02 ENCOUNTER — Ambulatory Visit: Payer: Medicare HMO | Admitting: Urology

## 2022-08-06 ENCOUNTER — Encounter: Payer: Self-pay | Admitting: Family Medicine

## 2022-08-06 ENCOUNTER — Telehealth: Payer: Self-pay | Admitting: Family Medicine

## 2022-08-06 ENCOUNTER — Ambulatory Visit: Payer: Medicare HMO | Admitting: Family Medicine

## 2022-08-06 VITALS — BP 118/70 | HR 80 | Temp 97.6°F | Ht 76.0 in | Wt 193.0 lb

## 2022-08-06 DIAGNOSIS — I10 Essential (primary) hypertension: Secondary | ICD-10-CM

## 2022-08-06 DIAGNOSIS — J101 Influenza due to other identified influenza virus with other respiratory manifestations: Secondary | ICD-10-CM | POA: Diagnosis not present

## 2022-08-06 DIAGNOSIS — K219 Gastro-esophageal reflux disease without esophagitis: Secondary | ICD-10-CM

## 2022-08-06 DIAGNOSIS — R5382 Chronic fatigue, unspecified: Secondary | ICD-10-CM | POA: Diagnosis not present

## 2022-08-06 DIAGNOSIS — K5909 Other constipation: Secondary | ICD-10-CM

## 2022-08-06 DIAGNOSIS — G4700 Insomnia, unspecified: Secondary | ICD-10-CM

## 2022-08-06 LAB — BASIC METABOLIC PANEL
BUN: 18 mg/dL (ref 6–23)
CO2: 28 mEq/L (ref 19–32)
Calcium: 9.7 mg/dL (ref 8.4–10.5)
Chloride: 104 mEq/L (ref 96–112)
Creatinine, Ser: 1.31 mg/dL (ref 0.40–1.50)
GFR: 47.87 mL/min — ABNORMAL LOW (ref >60.00)
Glucose, Bld: 88 mg/dL (ref 70–99)
Potassium: 4.3 mEq/L (ref 3.5–5.1)
Sodium: 139 mEq/L (ref 135–145)

## 2022-08-06 MED ORDER — ZOLPIDEM TARTRATE 5 MG PO TABS: 5.0000 mg | ORAL_TABLET | Freq: Every evening | ORAL | 1 refills | Status: DC | PRN

## 2022-08-06 MED ORDER — LACTULOSE 10 GM/15ML PO SOLN: 20.0000 g | Freq: Every day | ORAL | 0 refills | Status: DC | PRN

## 2022-08-06 NOTE — Patient Instructions (Signed)
Nice to see you. We are going to try Ambien to help with your sleep.  Please only take this if you absolutely need to.  If you are excessively drowsy the morning after taking this or you are doing odd things in the middle the night please discontinue use of this and let me know. Please try the lactulose for your constipation. We will contact you with your lab results.

## 2022-08-06 NOTE — Telephone Encounter (Signed)
Called Pt about getting his blood redrawn, probably at the hospital. No answer left a voice mail to call the lab.

## 2022-08-06 NOTE — Addendum Note (Signed)
Addended by: Neta Ehlers on: 08/06/2022 03:20 PM   Modules accepted: Orders

## 2022-08-06 NOTE — Assessment & Plan Note (Addendum)
I suspect this is contributing to his funny sensation in his abdomen.  I will give the patient a trial of lactulose to see if this will be helpful.  Discussed taking this if he does not have a bowel movement at least every 2 days.  Discussed if he has diarrhea with this medication he should let me know.

## 2022-08-06 NOTE — Assessment & Plan Note (Signed)
Discussed taking Protonix 40 mg twice daily 30 to 60 minutes prior to breakfast and dinner.  Discussed taking this appropriately may help with his abdominal sensation.

## 2022-08-06 NOTE — Assessment & Plan Note (Signed)
Adequately controlled.  Will continue lisinopril 10 mg daily.

## 2022-08-06 NOTE — Assessment & Plan Note (Signed)
Symptoms have resolved.  He will monitor.

## 2022-08-06 NOTE — Assessment & Plan Note (Signed)
Possibly related to poor sleep.  We will see how he does on Ambien for his sleep and if not improving could consider further evaluation.

## 2022-08-06 NOTE — Progress Notes (Signed)
Tommi Rumps, MD Phone: [phone]  William Taylor is a 88 y.o. male who presents today for follow-up.  Influenza: Patient had symptoms of cough, shortness of breath, fatigue, and aching chest discomfort over several days leading into his hospitalization.  He was treated with Tamiflu.  He was started on treatment for community-acquired pneumonia though this was narrowed to azithromycin his flu test was positive due to its anti-inflammatory properties for his lungs.  He notes at this point his flu symptoms have resolved.  He was noted to have AKI during hospitalization and his lisinopril was held initially.  It has been restarted.  He notes his blood pressure was okay 3 to 4 days ago when this was checked at home.  Fatigue/insomnia: Patient notes his sleep is not good.  He is tired during the day.  He gets about 4 hours of sleep nightly at best.  No depression or anxiety.  He goes to bed around 10 PM and sometimes has trouble falling asleep.  He oftentimes gets up to urinate 2 times at night.  No alcohol intake.  No caffeine intake.  Funny abdominal sensation: Patient reports this has been going on for months.  His stomach just feels funny.  No significant nausea.  No abdominal pain.  No blood in his stool.  No vomiting or diarrhea.  He notes at times he will eat and this sensation will improve.  He takes Protonix twice daily.  His morning dose of Protonix is at least 30 minutes before he eats though he takes his evening dose prior to bedtime.  He has a bowel movement every 3 to 4 days and occasionally has to strain.  He occasionally takes Senokot to help with constipation.  He reports MiraLAX has not been beneficial in the past.  Social History   Tobacco Use  Smoking Status Never  Smokeless Tobacco Never    Current Outpatient Medications on File Prior to Visit  Medication Sig Dispense Refill   amiodarone (PACERONE) 100 MG tablet Take 1 tablet (100 mg total) by mouth daily. 90 tablet 3    fenofibrate micronized (ANTARA) 130 MG capsule TAKE 1 CAPSULE BY MOUTH  DAILY BEFORE BREAKFAST 90 capsule 3   finasteride (PROSCAR) 5 MG tablet TAKE 1 TABLET BY MOUTH DAILY 90 tablet 3   guaiFENesin-dextromethorphan (ROBITUSSIN DM) 100-10 MG/5ML syrup Take 5 mLs by mouth every 4 (four) hours as needed for cough. 118 mL 0   Ipratropium-Albuterol (COMBIVENT RESPIMAT) 20-100 MCG/ACT AERS respimat Inhale 1 puff into the lungs every 6 (six) hours. 4 g 1   levobunolol (BETAGAN) 0.5 % ophthalmic solution Place 1 drop into the right eye 2 (two) times daily.     lisinopril (ZESTRIL) 10 MG tablet Take 1 tablet (10 mg total) by mouth daily. 90 tablet 3   loratadine (CLARITIN) 10 MG tablet Take 1 tablet (10 mg total) by mouth daily. 30 tablet 1   Multiple Vitamin (MULTIVITAMIN PO) Take 1 tablet by mouth daily.     nitroGLYCERIN (NITROSTAT) 0.4 MG SL tablet DISSOLVE UNDER THE TONGUE 1 TABLET EVERY 5 MINUTES AS NEEDED FOR CHEST PAIN 25 tablet 3   pantoprazole (PROTONIX) 40 MG tablet TAKE ONE (1) TABLET BY MOUTH TWO TIMES PER DAY 90 tablet 3   pravastatin (PRAVACHOL) 40 MG tablet Take 1 tablet (40 mg total) by mouth at bedtime. 90 tablet 3   Rivaroxaban (XARELTO) 15 MG TABS tablet Take 1 tablet (15 mg total) by mouth daily. 90 tablet 3   No  current facility-administered medications on file prior to visit.     ROS see history of present illness  Objective  Physical Exam Vitals:   08/06/22 1027  BP: 118/70  Pulse: 80  Temp: 97.6 F (36.4 C)  SpO2: 98%    BP Readings from Last 3 Encounters:  08/06/22 118/70  07/27/22 (!) 163/88  05/20/22 (!) 151/74   Wt Readings from Last 3 Encounters:  08/06/22 193 lb (87.5 kg)  07/26/22 204 lb 1.6 oz (92.6 kg)  05/20/22 204 lb (92.5 kg)    Physical Exam Constitutional:      General: He is not in acute distress.    Appearance: He is not diaphoretic.  Cardiovascular:     Rate and Rhythm: Normal rate and regular rhythm.     Heart sounds: Normal heart  sounds.  Pulmonary:     Effort: Pulmonary effort is normal.     Breath sounds: Normal breath sounds.  Abdominal:     General: Bowel sounds are normal. There is no distension.     Palpations: Abdomen is soft.     Tenderness: There is no abdominal tenderness.  Skin:    General: Skin is warm and dry.  Neurological:     Mental Status: He is alert.      Assessment/Plan: Please see individual problem list.  Insomnia, unspecified type Assessment & Plan: Chronic issue.  Trazodone is not an option given that he is on amiodarone.  Discussed a trial of Ambien 5 mg nightly.  Discussed risk of excessive drowsiness with this.  Discussed risk about activities in the middle the night.  If he has either of those he will discontinue Ambien and let us know.  His wife will monitor for any issues with this medication.  Controlled substance database reviewed.  Orders: -     Zolpidem Tartrate; Take 1 tablet (5 mg total) by mouth at bedtime as needed for sleep.  Dispense: 15 tablet; Refill: 1  Chronic fatigue Assessment & Plan: Possibly related to poor sleep.  We will see how he does on Ambien for his sleep and if not improving could consider further evaluation.   Influenza A Assessment & Plan: Symptoms have resolved.  He will monitor.  Orders: -     Basic metabolic panel -     CBC  Chronic constipation Assessment & Plan: I suspect this is contributing to his funny sensation in his abdomen.  I will give the patient a trial of lactulose to see if this will be helpful.  Discussed taking this if he does not have a bowel movement at least every 2 days.  Discussed if he has diarrhea with this medication he should let me know.  Orders: -     Lactulose; Take 30 mLs (20 g total) by mouth daily as needed for mild constipation.  Dispense: 236 mL; Refill: 0  Primary hypertension Assessment & Plan: Adequately controlled.  Will continue lisinopril 10 mg daily.   Gastroesophageal reflux disease without  esophagitis Assessment & Plan: Discussed taking Protonix 40 mg twice daily 30 to 60 minutes prior to breakfast and dinner.  Discussed taking this appropriately may help with his abdominal sensation.     Return in about 3 months (around 11/05/2022) for Insomnia follow-up.   Tommi Rumps, MD [place]

## 2022-08-06 NOTE — Assessment & Plan Note (Signed)
Chronic issue.  Trazodone is not an option given that he is on amiodarone.  Discussed a trial of Ambien 5 mg nightly.  Discussed risk of excessive drowsiness with this.  Discussed risk about activities in the middle the night.  If he has either of those he will discontinue Ambien and let us know.  His wife will monitor for any issues with this medication.  Controlled substance database reviewed.

## 2022-08-07 ENCOUNTER — Other Ambulatory Visit
Admission: RE | Admit: 2022-08-07 | Discharge: 2022-08-07 | Disposition: A | Discharge status: Home / Self Care | Payer: Medicare HMO | Source: Ambulatory Visit | Attending: Family Medicine | Admitting: Family Medicine

## 2022-08-07 ENCOUNTER — Telehealth: Payer: Self-pay | Admitting: Family Medicine

## 2022-08-07 DIAGNOSIS — J101 Influenza due to other identified influenza virus with other respiratory manifestations: Secondary | ICD-10-CM | POA: Diagnosis present

## 2022-08-07 LAB — CBC
HCT: 28.4 % — ABNORMAL LOW (ref 39.0–52.0)
Hemoglobin: 9.6 g/dL — ABNORMAL LOW (ref 13.0–17.0)
MCH: 33.3 pg (ref 26.0–34.0)
MCHC: 33.8 g/dL (ref 30.0–36.0)
MCV: 98.6 fL (ref 80.0–100.0)
Platelets: 320 10*3/uL (ref 150–400)
RBC: 2.88 MIL/uL — ABNORMAL LOW (ref 4.22–5.81)
RDW: 15.5 % (ref 11.5–15.5)
WBC: 7.2 10*3/uL (ref 4.0–10.5)
nRBC: 0 % (ref 0.0–0.2)

## 2022-08-07 NOTE — Addendum Note (Signed)
Addended by: Caryl Bis, Shelba Susi G on: 08/07/2022 11:06 AM   Modules accepted: Orders

## 2022-08-07 NOTE — Telephone Encounter (Signed)
Ordered

## 2022-08-07 NOTE — Telephone Encounter (Signed)
Noted.  Please call the patient and make sure he knows that he will need to have his CBC drawn elsewhere.  This can be done at the lab at the hospital or through [facility].  The order may have to be altered depending on where he would like to have it done.

## 2022-08-07 NOTE — Telephone Encounter (Signed)
Called Patient and told him that he would have to go to the hospital to have his blood redrawn. Patient stated that he would go today. The CBC has to be ordered for the hospital.

## 2022-08-09 ENCOUNTER — Other Ambulatory Visit: Payer: Self-pay | Admitting: Family Medicine

## 2022-08-09 ENCOUNTER — Telehealth: Payer: Self-pay

## 2022-08-09 DIAGNOSIS — Z760 Encounter for issue of repeat prescription: Secondary | ICD-10-CM

## 2022-08-09 NOTE — Telephone Encounter (Signed)
LMOM for pt to CB in regards to labs 

## 2022-08-20 ENCOUNTER — Inpatient Hospital Stay: Payer: Medicare HMO | Attending: Oncology | Admitting: Oncology

## 2022-08-20 ENCOUNTER — Inpatient Hospital Stay: Payer: Medicare HMO

## 2022-08-20 ENCOUNTER — Encounter: Payer: Self-pay | Admitting: Oncology

## 2022-08-20 VITALS — BP 155/83 | HR 87 | Temp 97.8°F | Resp 17 | Wt 194.0 lb

## 2022-08-20 DIAGNOSIS — D649 Anemia, unspecified: Secondary | ICD-10-CM

## 2022-08-20 DIAGNOSIS — N189 Chronic kidney disease, unspecified: Secondary | ICD-10-CM | POA: Diagnosis not present

## 2022-08-20 DIAGNOSIS — Z800 Family history of malignant neoplasm of digestive organs: Secondary | ICD-10-CM | POA: Diagnosis not present

## 2022-08-20 LAB — CBC WITH DIFFERENTIAL/PLATELET
Abs Immature Granulocytes: 0.05 10*3/uL (ref 0.00–0.07)
Basophils Absolute: 0 10*3/uL (ref 0.0–0.1)
Basophils Relative: 1 %
Eosinophils Absolute: 0 10*3/uL (ref 0.0–0.5)
Eosinophils Relative: 1 %
HCT: 30.7 % — ABNORMAL LOW (ref 39.0–52.0)
Hemoglobin: 10.3 g/dL — ABNORMAL LOW (ref 13.0–17.0)
Immature Granulocytes: 1 %
Lymphocytes Relative: 23 %
Lymphs Abs: 1.5 10*3/uL (ref 0.7–4.0)
MCH: 34 pg (ref 26.0–34.0)
MCHC: 33.6 g/dL (ref 30.0–36.0)
MCV: 101.3 fL — ABNORMAL HIGH (ref 80.0–100.0)
Monocytes Absolute: 0.6 10*3/uL (ref 0.1–1.0)
Monocytes Relative: 10 %
Neutro Abs: 4.2 10*3/uL (ref 1.7–7.7)
Neutrophils Relative %: 64 %
Platelets: 262 10*3/uL (ref 150–400)
RBC: 3.03 MIL/uL — ABNORMAL LOW (ref 4.22–5.81)
RDW: 15.5 % (ref 11.5–15.5)
WBC: 6.3 10*3/uL (ref 4.0–10.5)
nRBC: 0 % (ref 0.0–0.2)

## 2022-08-20 LAB — COMPREHENSIVE METABOLIC PANEL
ALT: 12 U/L (ref 0–44)
AST: 29 U/L (ref 15–41)
Albumin: 4.6 g/dL (ref 3.5–5.0)
Alkaline Phosphatase: 32 U/L — ABNORMAL LOW (ref 38–126)
Anion gap: 8 (ref 5–15)
BUN: 22 mg/dL (ref 8–23)
CO2: 25 mmol/L (ref 22–32)
Calcium: 9.4 mg/dL (ref 8.9–10.3)
Chloride: 107 mmol/L (ref 98–111)
Creatinine, Ser: 1.14 mg/dL (ref 0.61–1.24)
GFR, Estimated: >60 mL/min (ref >60)
Glucose, Bld: 97 mg/dL (ref 70–99)
Potassium: 4.1 mmol/L (ref 3.5–5.1)
Sodium: 140 mmol/L (ref 135–145)
Total Bilirubin: 2.1 mg/dL — ABNORMAL HIGH (ref 0.3–1.2)
Total Protein: 7.6 g/dL (ref 6.5–8.1)

## 2022-08-20 LAB — RETICULOCYTES
Immature Retic Fract: 20.6 % — ABNORMAL HIGH (ref 2.3–15.9)
RBC.: 2.98 MIL/uL — ABNORMAL LOW (ref 4.22–5.81)
Retic Count, Absolute: 94.8 10*3/uL (ref 19.0–186.0)
Retic Ct Pct: 3.2 % — ABNORMAL HIGH (ref 0.4–3.1)

## 2022-08-20 LAB — TSH: TSH: 5.271 u[IU]/mL — ABNORMAL HIGH (ref 0.350–4.500)

## 2022-08-20 LAB — FERRITIN: Ferritin: 69 ng/mL (ref 24–336)

## 2022-08-20 LAB — LACTATE DEHYDROGENASE: LDH: 196 U/L — ABNORMAL HIGH (ref 98–192)

## 2022-08-20 LAB — VITAMIN B12: Vitamin B-12: 503 pg/mL (ref 180–914)

## 2022-08-20 NOTE — Progress Notes (Signed)
Patient here for oncology follow-up appointment, concerns of fatigue, cold sensitivity and constipation

## 2022-08-20 NOTE — Progress Notes (Signed)
Hematology/Oncology Consult note [facility]  [phone] [phone]  Patient Care Team: [name], MD as PCP - General (Family Medicine) Bary Castilla, Forest Gleason, MD as Consulting Physician (General Surgery) [name], MD as Referring Physician (Internal Medicine) [name], MD as Consulting Physician (Cardiology)   Name of the patient: William Taylor  [id number]  [birth year]   Date of visit: 08/20/22  Diagnosis-normocytic anemia  Chief complaint/ Reason for visit-reestablish follow-up for anemia  Heme/Onc history: Patient is a 88 year old male was 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  Hemoglobin at that time was stable around 10.  Patient has been following up with Dr. Caryl Bis for his anemia and his hemoglobin has been more or less stable between 10-11 but at the last couple of values in November and December 2023 had drifted down to 9.5.  Interval history-patient reports some ongoing fatigue.  He was hospitalized in early December for flu.   ECOG PS- 2 Pain scale- 0   Review of systems- Review of Systems  Constitutional:  Positive for malaise/fatigue. Negative for chills, fever and weight loss.  HENT:  Negative for congestion, ear discharge and nosebleeds.   Eyes:  Negative for blurred vision.  Respiratory:  Negative for cough, hemoptysis, sputum production, shortness of breath and wheezing.   Cardiovascular:  Negative for chest pain, palpitations, orthopnea and claudication.  Gastrointestinal:  Negative for abdominal pain, blood in stool, constipation, diarrhea, heartburn, melena, nausea and vomiting.  Genitourinary:  Negative for dysuria, flank pain, frequency, hematuria and urgency.  Musculoskeletal:  Negative for back pain, joint pain and myalgias.  Skin:  Negative for rash.  Neurological:  Negative for dizziness, tingling, focal weakness,  seizures, weakness and headaches.  Endo/Heme/Allergies:  Does not bruise/bleed easily.  Psychiatric/Behavioral:  Negative for depression and suicidal ideas. The patient does not have insomnia.       Allergies  Allergen Reactions   Bee Venom Swelling   Sulfasalazine Hives and Swelling   Sulfa Antibiotics Hives and Swelling   Warfarin    Warfarin And Related Other (See Comments)    Chest pain     Past Medical History:  Diagnosis Date   Cancer [facility])    Prostate, followed by Dr. Jacqlyn Larsen   Colon polyp    Coronary artery disease    a. 2005 s/p PCI LAD ([facility]); b 2012 s/p PCI OM2; c.10/2013 Cath: LAD 49mISR, 448mD1 90, D2 70, OM2 80 w/ patent stent;  d. 06/2019 Low risk MV; e. 07/2020 MV: EF 77%, no ischemia/infarct.   Gall stones    Hyperlipidemia    Hypertension    MI ([facility]ocardial infarction) ([facility]   2015   Orthostatic hypotension    a. prev on midodrine/florinef/northera.   PAF (paroxysmal atrial fibrillation) ([facility])    a. on xarelto and amio (CHA2DS2VASc = 4).   Skin cancer      Past Surgical History:  Procedure Laterality Date   CARDIAC CATHETERIZATION  10/2013   [facility]   CATARACT EXTRACTION  Oct. 3, 2012   right eye   colonoscopy     COLONOSCOPY W/ POLYPECTOMY  2015   Dr ReRayann Heman COLONOSCOPY WITH PROPOFOL N/A 08/15/2016   Procedure: COLONOSCOPY WITH PROPOFOL;  Surgeon: KiJonathon BellowsMD;  [facility];  Service: Endoscopy;  Laterality: N/A;   CORONARY ANGIOPLASTY  2009   2005; s/p stent   ENTEROSCOPY N/A 09/22/2018  Procedure: ENTEROSCOPY;  Surgeon: Lin Landsman, MD;  Location: [facility] ENDOSCOPY;  Service: Gastroenterology;  Laterality: N/A;   ESOPHAGOGASTRODUODENOSCOPY (EGD) WITH PROPOFOL N/A 08/15/2016   Procedure: ESOPHAGOGASTRODUODENOSCOPY (EGD) WITH PROPOFOL;  Surgeon: [name], MD;  Location: [facility] ENDOSCOPY;  Service: Endoscopy;  Laterality: N/A;   GIVENS CAPSULE STUDY N/A 09/26/2016   Procedure: GIVENS CAPSULE STUDY;  Surgeon: [name], MD;  Location:  [facility] ENDOSCOPY;  Service: Endoscopy;  Laterality: N/A;   HERNIA REPAIR  2013   LEFT HEART CATH AND CORONARY ANGIOGRAPHY N/A 04/29/2022   Procedure: LEFT HEART CATH AND CORONARY ANGIOGRAPHY;  Surgeon: Wellington Hampshire, MD;  Location: [place] [facility] LAB;  Service: Cardiovascular;  Laterality: N/A;   LUMBAR SPINE SURGERY     UPPER GI ENDOSCOPY  Sept 2015   Dr Rayann Heman    Social History   Socioeconomic History   Marital status: Married    Spouse name: Not on file   Number of children: Not on file   Years of education: Not on file   Highest education level: Not on file  Occupational History   Occupation: retired   Tobacco Use   Smoking status: Never   Smokeless tobacco: Never  Vaping Use   Vaping Use: Never used  Substance and Sexual Activity   Alcohol use: No   Drug use: No   Sexual activity: Not Currently  Other Topics Concern   Not on file  Social History Narrative   Not on file   Social Determinants of Health   Financial Resource Strain: Low Risk  (03/02/2019)   Overall Financial Resource Strain (CARDIA)    Difficulty of Paying Living Expenses: Not hard at all  Food Insecurity: No Food Insecurity (07/25/2022)   Hunger Vital Sign    Worried About Running Out of Food in the Last Year: Never true    [place] in the Last Year: Never true  Transportation Needs: No Transportation Needs (07/25/2022)   PRAPARE - Hydrologist (Medical): No    Lack of Transportation (Non-Medical): No  Physical Activity: Not on file  Stress: No Stress Concern Present (09/08/2019)   [place]    Feeling of Stress : Not at all  Social Connections: Unknown (09/08/2019)   Social Connection and Isolation Panel [NHANES]    Frequency of Communication with Friends and Family: More than three times a week    Frequency of Social Gatherings with Friends and Family: Once a week    Attends Religious Services:  Not on Diplomatic Services operational officer of Clubs or Organizations: Yes    Attends Archivist Meetings: Not on file    Marital Status: Married  Intimate Partner Violence: Not At Risk (07/25/2022)   Humiliation, Afraid, Rape, and Kick questionnaire    Fear of Current or Ex-Partner: No    Emotionally Abused: No    Physically Abused: No    Sexually Abused: No    Family History  Problem Relation Age of Onset   Stroke Mother    Colon cancer Father    Coronary artery disease Brother    Other Brother 77       lymes dz     Current Outpatient Medications:    amiodarone (PACERONE) 100 MG tablet, Take 1 tablet (100 mg total) by mouth daily., Disp: 90 tablet, Rfl: 3   fenofibrate micronized (ANTARA) 130 MG capsule, TAKE 1 CAPSULE BY MOUTH  DAILY BEFORE  BREAKFAST, Disp: 90 capsule, Rfl: 3   finasteride (PROSCAR) 5 MG tablet, TAKE 1 TABLET BY MOUTH DAILY, Disp: 90 tablet, Rfl: 3   guaiFENesin-dextromethorphan (ROBITUSSIN DM) 100-10 MG/5ML syrup, Take 5 mLs by mouth every 4 (four) hours as needed for cough., Disp: 118 mL, Rfl: 0   Ipratropium-Albuterol (COMBIVENT RESPIMAT) 20-100 MCG/ACT AERS respimat, Inhale 1 puff into the lungs every 6 (six) hours., Disp: 4 g, Rfl: 1   lactulose (CHRONULAC) 10 GM/15ML solution, Take 30 mLs (20 g total) by mouth daily as needed for mild constipation., Disp: 236 mL, Rfl: 0   levobunolol (BETAGAN) 0.5 % ophthalmic solution, Place 1 drop into the right eye 2 (two) times daily., Disp: , Rfl:    lisinopril (ZESTRIL) 10 MG tablet, Take 1 tablet (10 mg total) by mouth daily., Disp: 90 tablet, Rfl: 3   loratadine (CLARITIN) 10 MG tablet, Take 1 tablet (10 mg total) by mouth daily., Disp: 30 tablet, Rfl: 1   Multiple Vitamin (MULTIVITAMIN PO), Take 1 tablet by mouth daily., Disp: , Rfl:    nitroGLYCERIN (NITROSTAT) 0.4 MG SL tablet, DISSOLVE UNDER THE TONGUE 1 TABLET EVERY 5 MINUTES AS NEEDED FOR CHEST PAIN, Disp: 25 tablet, Rfl: 3   pantoprazole (PROTONIX) 40 MG tablet,  TAKE ONE (1) TABLET BY MOUTH TWO TIMES PER DAY, Disp: 90 tablet, Rfl: 3   pravastatin (PRAVACHOL) 40 MG tablet, TAKE 1 TABLET BY MOUTH AT  BEDTIME, Disp: 90 tablet, Rfl: 3   Rivaroxaban (XARELTO) 15 MG TABS tablet, Take 1 tablet (15 mg total) by mouth daily., Disp: 90 tablet, Rfl: 3   zolpidem (AMBIEN) 5 MG tablet, Take 1 tablet (5 mg total) by mouth at bedtime as needed for sleep., Disp: 15 tablet, Rfl: 1  Physical exam:  Vitals:   08/20/22 1144  BP: (!) 155/83  Pulse: 87  Resp: 17  Temp: 97.8 F (36.6 C)  TempSrc: Tympanic  SpO2: 98%  Weight: 194 lb (88 kg)   Physical Exam Cardiovascular:     Rate and Rhythm: Normal rate. Rhythm irregular.     Heart sounds: Normal heart sounds.  Pulmonary:     Effort: Pulmonary effort is normal.     Breath sounds: Normal breath sounds.  Skin:    General: Skin is warm and dry.  Neurological:     Mental Status: He is alert and oriented to person, place, and time.         Latest Ref Rng & Units 08/20/2022   12:48 PM  CMP  Glucose 70 - 99 mg/dL 97   BUN 8 - 23 mg/dL 22   Creatinine 0.61 - 1.24 mg/dL 1.14   Sodium 135 - 145 mmol/L 140   Potassium 3.5 - 5.1 mmol/L 4.1   Chloride 98 - 111 mmol/L 107   CO2 22 - 32 mmol/L 25   Calcium 8.9 - 10.3 mg/dL 9.4   Total Protein 6.5 - 8.1 g/dL 7.6   Total Bilirubin 0.3 - 1.2 mg/dL 2.1   Alkaline Phos 38 - 126 U/L 32   AST 15 - 41 U/L 29   ALT 0 - 44 U/L 12       Latest Ref Rng & Units 08/20/2022   12:48 PM  CBC  WBC 4.0 - 10.5 K/uL 6.3   Hemoglobin 13.0 - 17.0 g/dL 10.3   Hematocrit 39.0 - 52.0 % 30.7   Platelets 150 - 400 K/uL 262     No images are attached to the encounter.  CT Chest Wo  Contrast  Result Date: 07/24/2022 CLINICAL DATA:  Left-sided chest pain since this morning EXAM: CT CHEST WITHOUT CONTRAST TECHNIQUE: Multidetector CT imaging of the chest was performed following the standard protocol without IV contrast. RADIATION DOSE REDUCTION: This exam was performed according to the  departmental dose-optimization program which includes automated exposure control, adjustment of the mA and/or kV according to patient size and/or use of iterative reconstruction technique. COMPARISON:  07/24/2022, 09/08/2020 FINDINGS: Cardiovascular: Unenhanced imaging of the heart and great vessels demonstrates no pericardial effusion. There is calcification of the aortic and mitral valves. Diffuse atherosclerosis of the coronary vasculature unchanged. Normal caliber of the thoracic aorta. Stable atherosclerosis of the aortic arch and descending thoracic aorta. Evaluation of the vascular lumen is limited without IV contrast. Mediastinum/Nodes: No enlarged mediastinal or axillary lymph nodes. Thyroid gland, trachea, and esophagus demonstrate no significant findings. Small hiatal hernia. Lungs/Pleura: No acute airspace disease, effusion, or pneumothorax. Mild background emphysema. Stable subpleural scarring at the lung bases. Stable benign 4 mm left lower lobe pulmonary nodule reference image 99/3, no specific follow-up is required given long-term stability. Central airways are patent. Upper Abdomen: No acute abnormality. Musculoskeletal: No acute or destructive bony lesions. Reconstructed images demonstrate no additional findings. IMPRESSION: 1. No acute intrathoracic process. 2. Stable emphysema and bibasilar scarring. 3. Aortic Atherosclerosis (ICD10-I70.0). Coronary artery atherosclerosis. Electronically Signed   By: Randa Ngo M.D.   On: 07/24/2022 23:08   DG Chest 2 View  Result Date: 07/24/2022 CLINICAL DATA:  Chest pain EXAM: CHEST - 2 VIEW COMPARISON:  04/27/2022 FINDINGS: Cardiac size is within normal limits. Possible coronary artery calcifications are seen. There are no signs of pulmonary edema or focal pulmonary consolidation. There is no pleural effusion or pneumothorax. Patient's chin is partly obscuring the medial aspects of both apices. IMPRESSION: There are no signs of pulmonary edema or  focal pulmonary consolidation. Electronically Signed   By: Elmer Picker M.D.   On: 07/24/2022 18:23     Assessment and plan- Patient is a 89 y.o. male referred for normocytic anemia  Patient's baseline hemoglobin is typically between 10-11.  He likely has a component of anemia of chronic disease.  He also has some baseline CKD with a creatinine that fluctuates between 1.3-1.5 which may be contributing to his anemia as well.  I will do complete anemia workup today including CBC with differential, CMP, ferritin and iron studies B12 and folate myeloma panel, serum free light chains haptoglobin and TSH.  I will see him back in about 2 to 3 weeks time to discuss the results of blood work.   Visit Diagnosis 1. Normocytic anemia      Dr. Randa Evens, MD, MPH [facility] [phone] 08/20/2022 4:21 PM

## 2022-08-21 LAB — HAPTOGLOBIN: Haptoglobin: <10 mg/dL — ABNORMAL LOW (ref 38–329)

## 2022-08-21 LAB — KAPPA/LAMBDA LIGHT CHAINS
Kappa free light chain: 33.1 mg/L — ABNORMAL HIGH (ref 3.3–19.4)
Kappa, lambda light chain ratio: 1.72 — ABNORMAL HIGH (ref 0.26–1.65)
Lambda free light chains: 19.2 mg/L (ref 5.7–26.3)

## 2022-08-26 ENCOUNTER — Ambulatory Visit: Payer: Medicare HMO | Admitting: Urology

## 2022-08-26 ENCOUNTER — Encounter: Payer: Self-pay | Admitting: Urology

## 2022-08-26 VITALS — BP 157/70 | HR 86 | Ht 76.0 in | Wt 197.0 lb

## 2022-08-26 DIAGNOSIS — C61 Malignant neoplasm of prostate: Secondary | ICD-10-CM

## 2022-08-26 DIAGNOSIS — N401 Enlarged prostate with lower urinary tract symptoms: Secondary | ICD-10-CM | POA: Diagnosis not present

## 2022-08-26 LAB — MULTIPLE MYELOMA PANEL, SERUM
Albumin SerPl Elph-Mcnc: 4.1 g/dL (ref 2.9–4.4)
Albumin/Glob SerPl: 1.7 (ref 0.7–1.7)
Alpha 1: 0.3 g/dL (ref 0.0–0.4)
Alpha2 Glob SerPl Elph-Mcnc: 0.5 g/dL (ref 0.4–1.0)
B-Globulin SerPl Elph-Mcnc: 0.9 g/dL (ref 0.7–1.3)
Gamma Glob SerPl Elph-Mcnc: 0.7 g/dL (ref 0.4–1.8)
Globulin, Total: 2.5 g/dL (ref 2.2–3.9)
IgA: 240 mg/dL (ref 61–437)
IgG (Immunoglobin G), Serum: 763 mg/dL (ref 603–1613)
IgM (Immunoglobulin M), Srm: 122 mg/dL (ref 15–143)
M Protein SerPl Elph-Mcnc: 0.3 g/dL — ABNORMAL HIGH
Total Protein ELP: 6.6 g/dL (ref 6.0–8.5)

## 2022-08-26 LAB — BLADDER SCAN AMB NON-IMAGING: Scan Result: 90

## 2022-08-26 MED ORDER — MIRABEGRON ER 25 MG PO TB24: 25.0000 mg | ORAL_TABLET | Freq: Every day | ORAL | 0 refills | Status: DC

## 2022-08-26 NOTE — Progress Notes (Unsigned)
08/26/2022 2:18 PM   [name] Apr 18, 1930 [id number]  Referring provider: [name], MD [address] [place],  [place] [address]  Chief Complaint  Patient presents with   Prostate Cancer    Urologic history: 1. 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 Jacqlyn Larsen; PSA 5.7; 77 g   focus Gleason 3+3 RLB Elected active surveillance; started finasteride Baseline PSA 0.1-0.6 since 2015  HPI: 89 y.o. male presents for annual follow-up.  Was having worsening voiding symptoms at last year's visit and was given a trial of silodosin however he does not remember taking Most bothersome symptoms are nighttime urgency with voiding small amounts PSA has been undetectable since 07/2020 and he elected to discontinue PSA testing   PMH: Past Medical History:  Diagnosis Date   Cancer [facility])    Prostate, followed by Dr. Jacqlyn Larsen   Colon polyp    Coronary artery disease    a. 2005 s/p PCI LAD ([facility]); b 2012 s/p PCI OM2; c.10/2013 Cath: LAD 33mISR, 44mD1 90, D2 70, OM2 80 w/ patent stent;  d. 06/2019 Low risk MV; e. 07/2020 MV: EF 77%, no ischemia/infarct.   Gall stones    Hyperlipidemia    Hypertension    [place]   [place]   Orthostatic hypotension    a. prev on midodrine/florinef/northera.   PAF (paroxysmal atrial fibrillation) (HCC)    a. on xarelto and amio (CHA2DS2VASc = 4).   Skin cancer     Surgical History: Past Surgical History:  Procedure Laterality Date   CARD[facility]IZATION  [facility]   [facility]   CATARACT EXTRACTION  Oct. 3, 2012   right eye   colonoscopy     COLONOSCOPY W/ POLYPECTOMY  2015   Dr ReRayann Heman COLONOSCOPY WITH PROPOFOL N/A 08/15/2016   Procedure: COLONOSCOPY WITH PROPOFOL;  Surgeon: KiJonathon BellowsMD;  [facility];  Service: Endoscopy;  Laterality: N/A;   CORONARY ANGIOPLASTY  2009   2005; s/p stent   ENTEROSCOPY N/A 09/22/2018   Procedure: ENTEROSCOPY;  Surgeon: VaLin LandsmanMD;  [facility];  Service: Gastroenterology;  Laterality: N/A;   ESOPHAGOGASTRODUODENOSCOPY (EGD) WITH PROPOFOL N/A 08/15/2016   Procedure: ESOPHAGOGASTRODUODENOSCOPY (EGD) WITH PROPOFOL;  Surgeon: KiJonathon BellowsMD;  [facility];  Service: Endoscopy;  Laterality: N/A;   GIVENS CAPSULE STUDY N/A 09/26/2016   Procedure: GIVENS CAPSULE STUDY;  Surgeon: KiJonathon BellowsMD;  Location: [facility] ENDOSCOPY;  Service: Endoscopy;  Laterality: N/A;   HERNIA REPAIR  2013   LEFT HEART CATH AND CORONARY ANGIOGRAPHY N/A 04/29/2022   Procedure: LEFT HEART CATH AND CORONARY ANGIOGRAPHY;  Surgeon: ArWellington HampshireMD;  [facility];  Service: Cardiovascular;  Laterality: N/A;   LUMBAR SPINE SURGERY     UPPER GI ENDOSCOPY  Sept 2015   Dr ReRudean Hittedications:  Allergies as of 08/26/2022       Reactions   Bee Venom Swelling   Sulfasalazine Hives, Swelling   Sulfa Antibiotics Hives, Swelling   Warfarin    Warfarin And Related Other (See Comments)   Chest pain        Medication List        Accurate as of August 26, 2022  2:18 PM. If you have any questions, ask your nurse or doctor.          STOP taking these medications    guaiFENesin-dextromethorphan 100-10 MG/5ML syrup Commonly  known as: ROBITUSSIN DM Stopped by: Abbie Sons, MD       TAKE these medications    amiodarone 100 MG tablet Commonly known as: PACERONE Take 1 tablet (100 mg total) by mouth daily.   Combivent Respimat 20-100 MCG/ACT Aers respimat Generic drug: Ipratropium-Albuterol Inhale 1 puff into the lungs every 6 (six) hours.   fenofibrate micronized 130 MG capsule Commonly known as: ANTARA TAKE 1 CAPSULE BY MOUTH  DAILY BEFORE BREAKFAST   finasteride 5 MG tablet Commonly known as: PROSCAR TAKE 1 TABLET BY MOUTH DAILY   lactulose 10 GM/15ML solution Commonly known as: CHRONULAC Take 30 mLs (20 g total) by mouth daily as needed for mild constipation.    levobunolol 0.5 % ophthalmic solution Commonly known as: BETAGAN Place 1 drop into the right eye 2 (two) times daily.   lisinopril 10 MG tablet Commonly known as: ZESTRIL Take 1 tablet (10 mg total) by mouth daily.   loratadine 10 MG tablet Commonly known as: CLARITIN Take 1 tablet (10 mg total) by mouth daily.   MULTIVITAMIN PO Take 1 tablet by mouth daily.   nitroGLYCERIN 0.4 MG SL tablet Commonly known as: NITROSTAT DISSOLVE UNDER THE TONGUE 1 TABLET EVERY 5 MINUTES AS NEEDED FOR CHEST PAIN   pantoprazole 40 MG tablet Commonly known as: PROTONIX TAKE ONE (1) TABLET BY MOUTH TWO TIMES PER DAY   pravastatin 40 MG tablet Commonly known as: PRAVACHOL TAKE 1 TABLET BY MOUTH AT  BEDTIME   Rivaroxaban 15 MG Tabs tablet Commonly known as: Xarelto Take 1 tablet (15 mg total) by mouth daily.   zolpidem 5 MG tablet Commonly known as: AMBIEN Take 1 tablet (5 mg total) by mouth at bedtime as needed for sleep.        Allergies:  Allergies  Allergen Reactions   Bee Venom Swelling   Sulfasalazine Hives and Swelling   Sulfa Antibiotics Hives and Swelling   Warfarin    Warfarin And Related Other (See Comments)    Chest pain    Family History: Family History  Problem Relation Age of Onset   Stroke Mother    Colon cancer Father    Coronary artery disease Brother    Other Brother 26       lymes dz    Social History:  reports that he has never smoked. He has never used smokeless tobacco. He reports that he does not drink alcohol and does not use drugs.   Physical Exam: BP (!) 157/70   Pulse 86   Ht '6\' 4"'$  (1.93 m)   Wt 197 lb (89.4 kg)   BMI 23.98 kg/m   Constitutional:  Alert and oriented, No acute distress. Respiratory: Normal respiratory effort, no increased work of breathing. Psychiatric: Normal mood and affect.   Assessment & Plan:    1.  BPH with LUTS Worsening storage related voiding symptoms Continue finasteride PVR 90 mL Add Myrbetriq 25 mg  daily-samples given and he will call back regarding efficacy  2.  Low risk prostate cancer Elected to discontinue annual PSA testing  Continue annual follow-up   Abbie Sons, MD  [place] [address] [place], [place] [address] [phone]

## 2022-09-05 ENCOUNTER — Inpatient Hospital Stay (HOSPITAL_BASED_OUTPATIENT_CLINIC_OR_DEPARTMENT_OTHER): Payer: Medicare HMO | Admitting: Oncology

## 2022-09-05 ENCOUNTER — Encounter: Payer: Self-pay | Admitting: Oncology

## 2022-09-05 VITALS — BP 136/75 | HR 78 | Temp 96.0°F | Resp 17 | Wt 199.0 lb

## 2022-09-05 DIAGNOSIS — D649 Anemia, unspecified: Secondary | ICD-10-CM

## 2022-09-05 NOTE — Progress Notes (Signed)
Patient here for oncology follow-up appointment, no new concerns  

## 2022-09-05 NOTE — Progress Notes (Signed)
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 Hemoglobin at that time was stable around 10. Patient has been following up with Dr. Caryl Bis for his anemia and his hemoglobin has been more or less stable between 10-11 but at the last couple of values in November and December 2023 had drifted down to 9.5.   Results of anemia of blood work from 08/20/2022 showed normal B12 and iron levels.  Haptoglobin was less than 10.  Myeloma panel showed 0.3 IgM monoclonal protein kappa light chain specificity.  Reticulocyte count was mildly elevated at 3.2%  Interval history-no acute issues since last visit.  ECOG PS- 2 Pain scale- 0   Review of systems- Review of Systems  Constitutional:  Positive for malaise/fatigue. Negative for chills, fever and weight loss.  HENT:  Negative for congestion, ear discharge and nosebleeds.   Eyes:  Negative for blurred vision.  Respiratory:  Negative for cough, hemoptysis, sputum production, shortness of breath and wheezing.   Cardiovascular:  Negative for chest pain, palpitations, orthopnea and claudication.  Gastrointestinal:  Negative for abdominal pain, blood in stool, constipation, diarrhea, heartburn, melena, nausea and vomiting.  Genitourinary:  Negative for  dysuria, flank pain, frequency, hematuria and urgency.  Musculoskeletal:  Negative for back pain, joint pain and myalgias.  Skin:  Negative for rash.  Neurological:  Negative for dizziness, tingling, focal weakness, seizures, weakness and headaches.  Endo/Heme/Allergies:  Does not bruise/bleed easily.  Psychiatric/Behavioral:  Negative for depression and suicidal ideas. The patient does not have insomnia.       Allergies  Allergen Reactions   Bee Venom Swelling   Sulfasalazine Hives and Swelling   Sulfa Antibiotics Hives and Swelling   Warfarin    Warfarin And Related Other (See Comments)    Chest pain     Past Medical History:  Diagnosis Date   Cancer [facility])    Prostate, followed by Dr. Jacqlyn Larsen   Colon polyp    Coronary artery disease    a. 2005 s/p PCI LAD ([facility]); b 2012 s/p PCI OM2; c.10/2013 Cath: LAD 36mISR, 444mD1 90, D2 70, OM2 80 w/ patent stent;  d. 06/2019 Low risk MV; e. 07/2020 MV: EF 77%, no ischemia/infarct.   Gall stones    Hyperlipidemia    Hypertension    [place]   [place]   Orthostatic hypotension    a. prev on midodrine/florinef/northera.   PAF (paroxysmal atrial fibrillation) (HCC)    a. on xarelto and amio (CHA2DS2VASc = 4).   Skin cancer      Past Surgical History:  Procedure Laterality Date   CARDIAC [facility]IZATION  [facility]   [facility]   CATARACT EXTRACTION  Oct. 3, 2012   right eye   colonoscopy     COLONOSCOPY W/ POLYPECTOMY  2015   Dr ReRayann Heman COLONOSCOPY WITH PROPOFOL N/A 08/15/2016  Procedure: COLONOSCOPY WITH PROPOFOL;  Surgeon: [name], MD;  Location: [facility] ENDOSCOPY;  Service: Endoscopy;  Laterality: N/A;   CORONARY ANGIOPLASTY  2009   2005; s/p stent   ENTEROSCOPY N/A 09/22/2018   Procedure: ENTEROSCOPY;  Surgeon: Lin Landsman, MD;  [facility]COPY;  Service: Gastroenterology;  Laterality: N/A;   ESOPHAGOGASTRODUODENOSCOPY (EGD) WITH PROPOFOL N/A 08/15/2016   Procedure: ESOPHAGOGASTRODUODENOSCOPY  (EGD) WITH PROPOFOL;  Surgeon: [name], MD;  Location: [facility] ENDOSCOPY;  Service: Endoscopy;  Laterality: N/A;   GIVENS CAPSULE STUDY N/A 09/26/2016   Procedure: GIVENS CAPSULE STUDY;  Surgeon: [name], MD;  Location: [facility] ENDOSCOPY;  Service: Endoscopy;  Laterality: N/A;   HERNIA REPAIR  2013   LEFT HEART CATH AND CORONARY ANGIOGRAPHY N/A 04/29/2022   Procedure: LEFT HEART CATH AND CORONARY ANGIOGRAPHY;  Surgeon: Wellington Hampshire, MD;  Location: [place] CV LAB;  Service: Cardiovascular;  Laterality: N/A;   LUMBAR SPINE SURGERY     UPPER GI ENDOSCOPY  Sept 2015   Dr Rayann Heman    Social History   Socioeconomic History   Marital status: Married    Spouse name: Not on file   Number of children: Not on file   Years of education: Not on file   Highest education level: Not on file  Occupational History   Occupation: retired   Tobacco Use   Smoking status: Never   Smokeless tobacco: Never  Vaping Use   Vaping Use: Never used  Substance and Sexual Activity   Alcohol use: No   Drug use: No   Sexual activity: Not Currently  Other Topics Concern   Not on file  Social History Narrative   Not on file   Social Determinants of Health   Financial Resource Strain: Low Risk  (03/02/2019)   Overall Financial Resource Strain (CARDIA)    Difficulty of Paying Living Expenses: Not hard at all  Food Insecurity: No Food Insecurity (07/25/2022)   Hunger Vital Sign    Worried About Running Out of Food in the Last Year: Never true    [place] in the Last Year: Never true  Transportation Needs: No Transportation Needs (07/25/2022)   PRAPARE - Hydrologist (Medical): No    Lack of Transportation (Non-Medical): No  Physical Activity: Not on file  Stress: No Stress Concern Present (09/08/2019)   [place]    Feeling of Stress : Not at all  Social Connections: Unknown (09/08/2019)   Social  Connection and Isolation Panel [NHANES]    Frequency of Communication with Friends and Family: More than three times a week    Frequency of Social Gatherings with Friends and Family: Once a week    Attends Religious Services: Not on Diplomatic Services operational officer of Clubs or Organizations: Yes    Attends Archivist Meetings: Not on file    Marital Status: Married  Intimate Partner Violence: Not At Risk (07/25/2022)   Humiliation, Afraid, Rape, and Kick questionnaire    Fear of Current or Ex-Partner: No    Emotionally Abused: No    Physically Abused: No    Sexually Abused: No    Family History  Problem Relation Age of Onset   Stroke Mother    Colon cancer Father    Coronary artery disease Brother    Other Brother 23       lymes dz     Current Outpatient Medications:  amiodarone (PACERONE) 100 MG tablet, Take 1 tablet (100 mg total) by mouth daily., Disp: 90 tablet, Rfl: 3   fenofibrate micronized (ANTARA) 130 MG capsule, TAKE 1 CAPSULE BY MOUTH  DAILY BEFORE BREAKFAST, Disp: 90 capsule, Rfl: 3   finasteride (PROSCAR) 5 MG tablet, TAKE 1 TABLET BY MOUTH DAILY, Disp: 90 tablet, Rfl: 3   levobunolol (BETAGAN) 0.5 % ophthalmic solution, Place 1 drop into the right eye 2 (two) times daily., Disp: , Rfl:    lisinopril (ZESTRIL) 10 MG tablet, Take 1 tablet (10 mg total) by mouth daily., Disp: 90 tablet, Rfl: 3   loratadine (CLARITIN) 10 MG tablet, Take 1 tablet (10 mg total) by mouth daily., Disp: 30 tablet, Rfl: 1   mirabegron ER (MYRBETRIQ) 25 MG TB24 tablet, Take 1 tablet (25 mg total) by mouth daily., Disp: 28 tablet, Rfl: 0   Multiple Vitamin (MULTIVITAMIN PO), Take 1 tablet by mouth daily., Disp: , Rfl:    nitroGLYCERIN (NITROSTAT) 0.4 MG SL tablet, DISSOLVE UNDER THE TONGUE 1 TABLET EVERY 5 MINUTES AS NEEDED FOR CHEST PAIN, Disp: 25 tablet, Rfl: 3   pantoprazole (PROTONIX) 40 MG tablet, TAKE ONE (1) TABLET BY MOUTH TWO TIMES PER DAY, Disp: 90 tablet, Rfl: 3   pravastatin  (PRAVACHOL) 40 MG tablet, TAKE 1 TABLET BY MOUTH AT  BEDTIME, Disp: 90 tablet, Rfl: 3   Rivaroxaban (XARELTO) 15 MG TABS tablet, Take 1 tablet (15 mg total) by mouth daily., Disp: 90 tablet, Rfl: 3   zolpidem (AMBIEN) 5 MG tablet, Take 1 tablet (5 mg total) by mouth at bedtime as needed for sleep., Disp: 15 tablet, Rfl: 1   Ipratropium-Albuterol (COMBIVENT RESPIMAT) 20-100 MCG/ACT AERS respimat, Inhale 1 puff into the lungs every 6 (six) hours., Disp: 4 g, Rfl: 1   lactulose (CHRONULAC) 10 GM/15ML solution, Take 30 mLs (20 g total) by mouth daily as needed for mild constipation. (Patient not taking: Reported on 09/05/2022), Disp: 236 mL, Rfl: 0  Physical exam:  Vitals:   09/05/22 1020  BP: 136/75  Pulse: 78  Resp: 17  Temp: (!) 96 F (35.6 C)  TempSrc: Tympanic  SpO2: 100%  Weight: 199 lb (90.3 kg)   Physical Exam Cardiovascular:     Rate and Rhythm: Normal rate and regular rhythm.     Heart sounds: Normal heart sounds.  Pulmonary:     Effort: Pulmonary effort is normal.     Breath sounds: Normal breath sounds.  Skin:    General: Skin is warm and dry.  Neurological:     Mental Status: He is alert and oriented to person, place, and time.         Latest Ref Rng & Units 08/20/2022   12:48 PM  CMP  Glucose 70 - 99 mg/dL 97   BUN 8 - 23 mg/dL 22   Creatinine 0.61 - 1.24 mg/dL 1.14   Sodium 135 - 145 mmol/L 140   Potassium 3.5 - 5.1 mmol/L 4.1   Chloride 98 - 111 mmol/L 107   CO2 22 - 32 mmol/L 25   Calcium 8.9 - 10.3 mg/dL 9.4   Total Protein 6.5 - 8.1 g/dL 7.6   Total Bilirubin 0.3 - 1.2 mg/dL 2.1   Alkaline Phos 38 - 126 U/L 32   AST 15 - 41 U/L 29   ALT 0 - 44 U/L 12       Latest Ref Rng & Units 08/20/2022   12:48 PM  CBC  WBC 4.0 - 10.5 K/uL 6.3  Hemoglobin 13.0 - 17.0 g/dL 10.3   Hematocrit 39.0 - 52.0 % 30.7   Platelets 150 - 400 K/uL 262     Assessment and plan- Patient is a 89 y.o. male referred for normocytic anemia  Patient does have undetectable  haptoglobin indicating low level of hemolysis that may be contributing to his anemia.  His hemoglobin has remained stable around 10 for the last 2 years.  Iron studies and B12 levels are normal.  He was noted to have a small 0.3 g of IgM kappa M protein although his IgM levels are normal.  [place]'s macroglobulinemia can sometimes be associated with cold agglutinin disease.  However given the absence of B symptoms, significant anemia or other cytopenias this does not require any treatment at this time.Patient also had a CT chest abdomen and pelvis within the last year which did not show any significant splenomegaly or adenopathy.  I recommend conservative monitoring of his anemia at this point.  Repeat labs in 4 months and 8 months and I will see him back in 8 months   Visit Diagnosis 1. Normocytic anemia      Dr. Randa Evens, MD, MPH [facility] at [facility] [phone] 09/05/2022 1:10 PM

## 2022-09-09 ENCOUNTER — Telehealth: Payer: Self-pay | Admitting: Cardiovascular Disease

## 2022-09-09 NOTE — Telephone Encounter (Signed)
Took the call.  Pt at dentist needing 1 extraction and on Xarelto.  Transferred call to Diona Browner, NP, Preop APP of the day.

## 2022-09-09 NOTE — Telephone Encounter (Signed)
   Pre-operative Risk Assessment    Patient Name: William Taylor  DOB: 08-Dec-1929 MRN: [id number]     Request for Surgical Clearance    Procedure:  Dental Extraction - Amount of Teeth to be Pulled:  1  Date of Surgery:  Clearance 09-09-22                                 Surgeon:  Dr Malissa Hippo Surgeon's Group or Practice Name:   Phone number:   Fax number:     Type of Clearance Requested:      Type of Anesthesia:     Additional requests/questions:    Signed, Glyn Ade   09/09/2022, 12:37 PM

## 2022-09-09 NOTE — Telephone Encounter (Signed)
   Patient Name: William Taylor  DOB: 02/26/1930 MRN: [id number]  Primary Cardiologist: None  Chart reviewed as part of pre-operative protocol coverage.   Simple dental extractions (i.e. 1-2 teeth) are considered low risk procedures per guidelines and generally do not require any specific cardiac clearance. It is also generally accepted that for simple extractions and dental cleanings, there is no need to interrupt blood thinner therapy.   SBE prophylaxis is not required for the patient from a cardiac standpoint.  I will route this recommendation to the requesting party via Epic fax function and remove from pre-op pool.  Please call with questions.  Lenna Sciara, NP 09/09/2022, 12:46 PM

## 2022-10-06 NOTE — Progress Notes (Unsigned)
Date:  10/07/2022   ID:  William Taylor, DOB 1929/09/22, MRN [id number]  Patient Location:  [place] [address]   Provider location:   [facility], [place] office  PCP:  [name], MD  Cardiologist:  Arvid Right [facility]  Chief Complaint  Patient presents with   6 month follow up     Patient c/o shortness of breath with little to no exertion and chest pain. Medications reviewed by the patient.     History of Present Illness:    William Taylor is a 89 y.o. male  past medical history of coronary artery disease, stent in 2005, cypher LAD atrial fibrillation with RVR in the setting of UTI September 2011,  cardiac catheterization Dec 25 2010, showing 90% OM 2 disease, transferred to [facility]  with DES stent placed, 2.25 x 16 mm POMUS stent 10/2013: Cardiac catheterization performed given his severe chest pain and no disease showing severe small vessel disease, patent stents PE following catheterization started on anticoagulation, atrial fibrillation in 2013,  Last atrial fibrillation July 2018  atypical chest pain July 2018 History of anemia Stress test low risk in 06/2019 Prior rapid weight loss 50 pounds with associated hypotension requiring Northera , Midodrine Florinef , these have been weaned off with weight gain Chronic chest pain Severe asymmetric septal hypertrophy on MRI 1.6 cm septum March 2022 who presents for follow-up of his coronary disease,  atrial fibrillation, chronic chest pain  Last seen by myself in clinic August 2023  Cardiac catheterization September 2023 Patent LAD and OM 3 stents, moderate nonobstructive coronary disease Medical management recommended  Reports some gait instability, no regular exercise program Weight is stable Denies recent chest pain, though occasionally will have symptoms that will last up to a day or more, resolved without intervention Does not he has nitro No significant leg  swelling, no PND orthopnea  Previously reported Lots of sitting, watches TV, during commercial breaks will get up and walk around Limited by chronic back pain  Lab work reviewed Hemoglobin 10.3, creatinine 1.14 BUN 22 TSH 5.2 Followed by oncology for his anemia  EKG personally reviewed by myself on todays visit Normal sinus rhythm rate 78 bpm no significant ST-T wave changes  Other past medical history reviewed In the hospital with covid 08/31/21 ED on 08/31/2021 with complaints of cough, shortness of breath, generalized weakness, poor p.o. intake.  He was seen at urgent care and had a near syncopal episode there, referred to the ED.  When seen in the ED here, he was ambulated and dropped oxygen saturation to 83% on room air.  COVID-19 PCR returned positive.  Blood pressure was soft at 99/64 in the ED.  Chest x-ray negative for pneumonia.  Admitted to [facility] and started on remdesivir and steroids.  Chest x-ray negative for pneumonia.   COVID-19 PCR in the ED positive.   Patient dropped oxygen saturation to 83% with ambulation in the ED. -remdesivir, IV steroids Positive for orthostatic hypotension 1/14: positive orthostatic vitals with BP dropping from 126/76 >> 113/79 >> 85/66 standing.    Echo 2/22: EF 60 percent Severe asymmetric basal septal hypertrophy.  Cardiac MRI reviewed No outflow tract obstruction    Past Medical History:  Diagnosis Date   Cancer [facility])    Prostate, followed by Dr. Jacqlyn Larsen   Colon polyp    Coronary artery disease    a. 2005 s/p PCI LAD ([facility]); b 2012 s/p PCI  OM2; c.10/2013 Cath: LAD 50mISR, 460mD1 90, D2 70, OM2 80 w/ patent stent;  d. 06/2019 Low risk MV; e. 07/2020 MV: EF 77%, no ischemia/infarct.   Gall stones    Hyperlipidemia    Hypertension    [place]   [place]   Orthostatic hypotension    a. prev on midodrine/florinef/northera.   PAF (paroxysmal atrial fibrillation) (HCC)    a. on xarelto and amio (CHA2DS2VASc =  4).   Skin cancer    Past Surgical History:  Procedure Laterality Date   CARDIAC CAT[facility]IZATION  [facility]   [facility]   CATARACT EXTRACTION  Oct. 3, 2012   right eye   colonoscopy     COLONOSCOPY W/ POLYPECTOMY  2015   Dr ReRayann Heman COLONOSCOPY WITH PROPOFOL N/A 08/15/2016   Procedure: COLONOSCOPY WITH PROPOFOL;  Surgeon: KiJonathon BellowsMD;  [facility];  Service: Endoscopy;  Laterality: N/A;   CORONARY ANGIOPLASTY  2009   2005; s/p stent   ENTEROSCOPY N/A 09/22/2018   Procedure: ENTEROSCOPY;  Surgeon: VaLin LandsmanMD;  [facility];  Service: Gastroenterology;  Laterality: N/A;   ESOPHAGOGASTRODUODENOSCOPY (EGD) WITH PROPOFOL N/A 08/15/2016   Procedure: ESOPHAGOGASTRODUODENOSCOPY (EGD) WITH PROPOFOL;  Surgeon: KiJonathon BellowsMD;  Location: [facility] ENDOSCOPY;  Service: Endoscopy;  Laterality: N/A;   GIVENS CAPSULE STUDY N/A 09/26/2016   Procedure: GIVENS CAPSULE STUDY;  Surgeon: KiJonathon BellowsMD;  Location: [facility] ENDOSCOPY;  Service: Endoscopy;  Laterality: N/A;   HERNIA REPAIR  2013   LEFT HEART CATH AND CORONARY ANGIOGRAPHY N/A 04/29/2022   Procedure: LEFT HEART CATH AND CORONARY ANGIOGRAPHY;  Surgeon: ArWellington HampshireMD;  [facility];  Service: Cardiovascular;  Laterality: N/A;   LUMBAR SPINE SURGERY     UPPER GI ENDOSCOPY  Sept 2015   Dr ReRayann Heman   Current Meds  Medication Sig   amiodarone (PACERONE) 100 MG tablet Take 1 tablet (100 mg total) by mouth daily.   fenofibrate micronized (ANTARA) 130 MG capsule TAKE 1 CAPSULE BY MOUTH  DAILY BEFORE BREAKFAST   finasteride (PROSCAR) 5 MG tablet TAKE 1 TABLET BY MOUTH DAILY   levobunolol (BETAGAN) 0.5 % ophthalmic solution Place 1 drop into the right eye 2 (two) times daily.   lisinopril (ZESTRIL) 10 MG tablet Take 1 tablet (10 mg total) by mouth daily.   loratadine (CLARITIN) 10 MG tablet Take 1 tablet (10 mg total) by mouth daily.   mirabegron ER (MYRBETRIQ) 25 MG TB24 tablet Take 1 tablet (25 mg total)  by mouth daily.   Multiple Vitamin (MULTIVITAMIN PO) Take 1 tablet by mouth daily.   nitroGLYCERIN (NITROSTAT) 0.4 MG SL tablet DISSOLVE UNDER THE TONGUE 1 TABLET EVERY 5 MINUTES AS NEEDED FOR CHEST PAIN   pantoprazole (PROTONIX) 40 MG tablet TAKE ONE (1) TABLET BY MOUTH TWO TIMES PER DAY   pravastatin (PRAVACHOL) 40 MG tablet TAKE 1 TABLET BY MOUTH AT  BEDTIME   Rivaroxaban (XARELTO) 15 MG TABS tablet Take 1 tablet (15 mg total) by mouth daily.   zolpidem (AMBIEN) 5 MG tablet Take 1 tablet (5 mg total) by mouth at bedtime as needed for sleep.     Allergies:   Bee venom, Sulfasalazine, Sulfa antibiotics, Warfarin, and Warfarin and related   Social History   Tobacco Use   Smoking status: Never   Smokeless tobacco: Never  Vaping Use   Vaping Use: Never used  Substance Use Topics   Alcohol use: No  Drug use: No     Family Hx: The patient's family history includes Colon cancer in his father; Coronary artery disease in his brother; Other (age of onset: 69) in his brother; Stroke in his mother.  ROS:   Please see the history of present illness.    Review of Systems  Constitutional: Negative.   HENT: Negative.    Respiratory:  Positive for shortness of breath.   Cardiovascular:  Positive for chest pain.  Gastrointestinal: Negative.   Musculoskeletal: Negative.   Neurological: Negative.   Psychiatric/Behavioral: Negative.    All other systems reviewed and are negative.   Labs/Other Tests and Data Reviewed:    Recent Labs: 07/24/2022: B Natriuretic Peptide 54.7 08/20/2022: ALT 12; BUN 22; Creatinine, Ser 1.14; Hemoglobin 10.3; Platelets 262; Potassium 4.1; Sodium 140; TSH 5.271   Recent Lipid Panel Lab Results  Component Value Date/Time   CHOL 97 05/13/2022 10:51 AM   CHOL 119 10/27/2013 06:09 AM   TRIG 76.0 05/13/2022 10:51 AM   TRIG 61 10/27/2013 06:09 AM   HDL 35.90 (L) 05/13/2022 10:51 AM   HDL 38 (L) 10/27/2013 06:09 AM   CHOLHDL 3 05/13/2022 10:51 AM   LDLCALC 46  05/13/2022 10:51 AM   LDLCALC 69 10/27/2013 06:09 AM    Wt Readings from Last 3 Encounters:  10/07/22 196 lb 4 oz (89 kg)  09/05/22 199 lb (90.3 kg)  08/26/22 197 lb (89.4 kg)     Exam:    BP (!) 144/86 (BP Location: Left Arm, Patient Position: Sitting, Cuff Size: Normal)   Pulse 78   Ht 6' 4"$  (1.93 m)   Wt 196 lb 4 oz (89 kg)   SpO2 97%   BMI 23.89 kg/m  Constitutional:  oriented to person, place, and time. No distress.  HENT:  Head: Grossly normal Eyes:  no discharge. No scleral icterus.  Neck: No JVD, no carotid bruits  Cardiovascular: Regular rate and rhythm, no murmurs appreciated Pulmonary/Chest: Clear to auscultation bilaterally, no wheezes or rails Abdominal: Soft.  no distension.  no tenderness.  Musculoskeletal: Normal range of motion Neurological:  normal muscle tone. Coordination normal. No atrophy Skin: Skin warm and dry Psychiatric: normal affect, pleasant  ASSESSMENT & PLAN:    orthostasis Associated with dramatic weight loss in 2020, possible depression Previously on Northera ,  Florinef,  midodrine  Weight has stabilized, blood pressure stable, denies significant orthostasis symptoms  Mixed hyperlipidemia  Cholesterol is at goal on the current lipid regimen. No changes to the medications were made.  Coronary artery disease involving native coronary artery of native heart without angina pectoris Chronic chest pain dating back many years Cardiac catheterization for chest pain 2015, medical management recommended at that time stress test no ischemia, 06/2019 and in 07/2020 Cardiac catheterization September 2023, nonobstructive disease No further ischemic workup needed Recommend nitro sublingual for chest pain  Atrial fibrillation, unspecified type ([place]) on Xarelto, amiodarone event monitor no significant atrial fibrillation Maintaining  sinus rhythm, no medication changes made  Depression Management primary care Weight improved, stable  Chronic  kidney disease (CKD), stage III (moderate) GFR back to normal range, followed by nephrology   Iron deficiency anemia, unspecified iron deficiency anemia type Baseline anemia Followed by hematology oncology, hemoglobin stable past 3 months    Total encounter time more than 30 minutes  Greater than 50% was spent in counseling and coordination of care with the patient  Signed, Ida Rogue, MD  10/07/2022 2:07 PM    [place]  [facility] [address], [place], [place] [address]

## 2022-10-07 ENCOUNTER — Ambulatory Visit: Payer: Medicare HMO | Attending: Cardiovascular Disease | Admitting: Cardiovascular Disease

## 2022-10-07 ENCOUNTER — Encounter: Payer: Self-pay | Admitting: Cardiovascular Disease

## 2022-10-07 VITALS — BP 144/86 | HR 78 | Ht 76.0 in | Wt 196.2 lb

## 2022-10-07 DIAGNOSIS — I25118 Atherosclerotic heart disease of native coronary artery with other forms of angina pectoris: Secondary | ICD-10-CM | POA: Diagnosis not present

## 2022-10-07 DIAGNOSIS — I480 Paroxysmal atrial fibrillation: Secondary | ICD-10-CM

## 2022-10-07 DIAGNOSIS — R079 Chest pain, unspecified: Secondary | ICD-10-CM

## 2022-10-07 DIAGNOSIS — R000 Tachycardia, unspecified: Secondary | ICD-10-CM

## 2022-10-07 DIAGNOSIS — I951 Orthostatic hypotension: Secondary | ICD-10-CM

## 2022-10-07 DIAGNOSIS — Z86711 Personal history of pulmonary embolism: Secondary | ICD-10-CM

## 2022-10-07 DIAGNOSIS — E782 Mixed hyperlipidemia: Secondary | ICD-10-CM

## 2022-10-07 DIAGNOSIS — I10 Essential (primary) hypertension: Secondary | ICD-10-CM | POA: Diagnosis not present

## 2022-10-07 DIAGNOSIS — I517 Cardiomegaly: Secondary | ICD-10-CM

## 2022-10-07 DIAGNOSIS — E785 Hyperlipidemia, unspecified: Secondary | ICD-10-CM

## 2022-10-07 MED ORDER — NITROGLYCERIN 0.4 MG SL SUBL: SUBLINGUAL_TABLET | SUBLINGUAL | 3 refills | Status: DC

## 2022-10-07 NOTE — Patient Instructions (Signed)
Medication Instructions:  No changes  If you need a refill on your cardiac medications before your next appointment, please call your pharmacy.    Lab work: No new labs needed   Testing/Procedures: No new testing needed   Follow-Up: At [facility], you and your health needs are our priority.  As part of our continuing mission to provide you with exceptional heart care, we have created designated Provider Care Teams.  These Care Teams include your primary Cardiologist (physician) and Advanced Practice Providers (APPs -  Physician Assistants and Nurse Practitioners) who all work together to provide you with the care you need, when you need it.  You will need a follow up appointment in 6 months  Providers on your designated Care Team:   Christopher Berge, NP Ryan Dunn, PA-C Cadence Furth, PA-C  COVID-19 Vaccine Information can be found at: [email or web address] For questions related to vaccine distribution or appointments, please email [email or web address] or call [phone].   

## 2022-10-10 DIAGNOSIS — R600 Localized edema: Secondary | ICD-10-CM | POA: Insufficient documentation

## 2022-10-17 ENCOUNTER — Encounter: Payer: Self-pay | Admitting: Family Medicine

## 2022-10-17 ENCOUNTER — Other Ambulatory Visit: Payer: Self-pay | Admitting: Family

## 2022-10-17 DIAGNOSIS — G4700 Insomnia, unspecified: Secondary | ICD-10-CM

## 2022-10-17 MED ORDER — ZOLPIDEM TARTRATE 5 MG PO TABS: 5.0000 mg | ORAL_TABLET | Freq: Every evening | ORAL | 1 refills | Status: DC | PRN

## 2022-10-29 ENCOUNTER — Other Ambulatory Visit: Payer: Self-pay | Admitting: Family Medicine

## 2022-10-29 DIAGNOSIS — G4700 Insomnia, unspecified: Secondary | ICD-10-CM

## 2022-10-30 ENCOUNTER — Other Ambulatory Visit: Payer: Self-pay | Admitting: Family Medicine

## 2022-10-30 DIAGNOSIS — G4700 Insomnia, unspecified: Secondary | ICD-10-CM

## 2022-10-30 NOTE — Telephone Encounter (Signed)
This was just filled on 10/17/22. I can see it in the controlled substance database. I can not refill until it is closer to the date the refill is due.

## 2022-10-30 NOTE — Telephone Encounter (Signed)
I can see where this was filled at [facility] in [place] on 10/17/22. Please call [facility] and let them know it appears the patient got this from [facility] on that date. Please also contact the patient to make sure he got the prescription from [facility].

## 2022-10-30 NOTE — Telephone Encounter (Signed)
Last filled on 10/17/22 by Dutch Quint with one refill.  LOV: 08/06/22  NOV: 01/06/23

## 2022-11-07 DIAGNOSIS — R6510 Systemic inflammatory response syndrome (SIRS) of non-infectious origin without acute organ dysfunction: Secondary | ICD-10-CM

## 2023-01-06 ENCOUNTER — Inpatient Hospital Stay: Payer: Medicare HMO | Attending: Oncology

## 2023-01-06 DIAGNOSIS — D649 Anemia, unspecified: Secondary | ICD-10-CM | POA: Diagnosis present

## 2023-01-06 LAB — CBC WITH DIFFERENTIAL/PLATELET
Abs Immature Granulocytes: 0.07 10*3/uL (ref 0.00–0.07)
Basophils Absolute: 0 10*3/uL (ref 0.0–0.1)
Basophils Relative: 1 %
Eosinophils Absolute: 0.1 10*3/uL (ref 0.0–0.5)
Eosinophils Relative: 1 %
HCT: 31.2 % — ABNORMAL LOW (ref 39.0–52.0)
Hemoglobin: 10.3 g/dL — ABNORMAL LOW (ref 13.0–17.0)
Immature Granulocytes: 1 %
Lymphocytes Relative: 27 %
Lymphs Abs: 1.8 10*3/uL (ref 0.7–4.0)
MCH: 33.6 pg (ref 26.0–34.0)
MCHC: 33 g/dL (ref 30.0–36.0)
MCV: 101.6 fL — ABNORMAL HIGH (ref 80.0–100.0)
Monocytes Absolute: 0.6 10*3/uL (ref 0.1–1.0)
Monocytes Relative: 8 %
Neutro Abs: 4.1 10*3/uL (ref 1.7–7.7)
Neutrophils Relative %: 62 %
Platelets: 269 10*3/uL (ref 150–400)
RBC: 3.07 MIL/uL — ABNORMAL LOW (ref 4.22–5.81)
RDW: 13.7 % (ref 11.5–15.5)
WBC: 6.6 10*3/uL (ref 4.0–10.5)
nRBC: 0 % (ref 0.0–0.2)

## 2023-01-06 LAB — RETIC PANEL
Immature Retic Fract: 21.7 % — ABNORMAL HIGH (ref 2.3–15.9)
RBC.: 3.06 MIL/uL — ABNORMAL LOW (ref 4.22–5.81)
Retic Count, Absolute: 87.8 10*3/uL (ref 19.0–186.0)
Retic Ct Pct: 2.9 % (ref 0.4–3.1)
Reticulocyte Hemoglobin: 33.9 pg (ref >27.9)

## 2023-01-07 LAB — HAPTOGLOBIN: Haptoglobin: <10 mg/dL — ABNORMAL LOW (ref 38–329)

## 2023-01-26 ENCOUNTER — Other Ambulatory Visit: Payer: Self-pay | Admitting: Cardiovascular Disease

## 2023-01-28 ENCOUNTER — Other Ambulatory Visit: Payer: Self-pay | Admitting: Gastroenterology

## 2023-01-31 ENCOUNTER — Encounter: Payer: Self-pay | Admitting: Oncology

## 2023-01-31 ENCOUNTER — Inpatient Hospital Stay: Payer: Medicare HMO | Attending: Oncology

## 2023-01-31 ENCOUNTER — Inpatient Hospital Stay: Payer: Medicare HMO | Admitting: Oncology

## 2023-01-31 VITALS — BP 149/90 | HR 87 | Temp 94.4°F | Resp 96 | Ht 76.0 in | Wt 196.8 lb

## 2023-01-31 DIAGNOSIS — M7021 Olecranon bursitis, right elbow: Secondary | ICD-10-CM | POA: Insufficient documentation

## 2023-01-31 DIAGNOSIS — Z8546 Personal history of malignant neoplasm of prostate: Secondary | ICD-10-CM | POA: Insufficient documentation

## 2023-01-31 DIAGNOSIS — D649 Anemia, unspecified: Secondary | ICD-10-CM

## 2023-01-31 DIAGNOSIS — C61 Malignant neoplasm of prostate: Secondary | ICD-10-CM

## 2023-01-31 DIAGNOSIS — D5912 Cold autoimmune hemolytic anemia: Secondary | ICD-10-CM | POA: Insufficient documentation

## 2023-01-31 LAB — PSA: Prostatic Specific Antigen: <0.01 ng/mL (ref 0.00–4.00)

## 2023-01-31 NOTE — Progress Notes (Signed)
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 Hemoglobin at that time was stable around 10. Patient has been following up with Dr. Birdie Sons for his anemia and his hemoglobin has been more or less stable between 10-11 but at the last couple of values in November and December 2023 had drifted down to 9.5.    Results of anemia of blood work from 08/20/2022 showed normal B12 and iron levels.  Haptoglobin was less than 10.  Myeloma panel showed 0.3 IgM monoclonal protein kappa light chain specificity.  Reticulocyte count was mildly elevated at 3.2%  Interval history-he is doing well for his age.  No recent hospitalizations.  He is concerned about his prior history of prostate cancer and would like surveillance for the same.  ECOG PS- 2 Pain scale- 0   Review of systems- Review of Systems  Constitutional:  Positive for malaise/fatigue. Negative for chills, fever and weight loss.  HENT:  Negative for congestion, ear discharge and nosebleeds.   Eyes:  Negative for blurred vision.  Respiratory:  Negative for cough, hemoptysis, sputum production, shortness of breath and wheezing.   Cardiovascular:  Negative for chest pain, palpitations, orthopnea and claudication.  Gastrointestinal:  Negative for abdominal  pain, blood in stool, constipation, diarrhea, heartburn, melena, nausea and vomiting.  Genitourinary:  Negative for dysuria, flank pain, frequency, hematuria and urgency.  Musculoskeletal:  Negative for back pain, joint pain and myalgias.  Skin:  Negative for rash.  Neurological:  Negative for dizziness, tingling, focal weakness, seizures, weakness and headaches.  Endo/Heme/Allergies:  Does not bruise/bleed easily.  Psychiatric/Behavioral:  Negative for depression and suicidal ideas. The patient does not have insomnia.       Allergies  Allergen Reactions   Bee Venom Swelling   Sulfasalazine Hives and Swelling   Sulfa Antibiotics Hives and Swelling   Warfarin    Warfarin And Related Other (See Comments)    Chest pain     Past Medical History:  Diagnosis Date   Cancer [facility])    Prostate, followed by Dr. Achilles Dunk   Colon polyp    Coronary artery disease    a. 2005 s/p PCI LAD ([facility]); b 2012 s/p PCI OM2; c.10/2013 Cath: LAD 50m ISR, 17m, D1 90, D2 70, OM2 80 w/ patent stent;  d. 06/2019 Low risk MV; e. 07/2020 MV: EF 77%, no ischemia/infarct.   Gall stones    Hyperlipidemia    Hypertension    MI (myocardial infarction) (HCC)    2015   Orthostatic hypotension    a. prev on midodrine/florinef/northera.   PAF (paroxysmal atrial fibrillation) (HCC)    a. on xarelto and amio (CHA2DS2VASc = 4).   Skin cancer      Past Surgical History:  Procedure Laterality Date   CARDIAC CATHETERIZATION  10/2013   [facility]   CATARACT EXTRACTION  Oct. 3, 2012   right eye  colonoscopy     COLONOSCOPY W/ POLYPECTOMY  2015   Dr Shelle Iron   COLONOSCOPY WITH PROPOFOL N/A 08/15/2016   Procedure: COLONOSCOPY WITH PROPOFOL;  Surgeon: Wyline Mood, MD;  Location: [facility] ENDOSCOPY;  Service: Endoscopy;  Laterality: N/A;   CORONARY ANGIOPLASTY  2009   2005; s/p stent   ENTEROSCOPY N/A 09/22/2018   Procedure: ENTEROSCOPY;  Surgeon: Toney Reil, MD;  [facility];  Service: Gastroenterology;   Laterality: N/A;   ESOPHAGOGASTRODUODENOSCOPY (EGD) WITH PROPOFOL N/A 08/15/2016   Procedure: ESOPHAGOGASTRODUODENOSCOPY (EGD) WITH PROPOFOL;  Surgeon: Wyline Mood, MD;  Location: [facility] ENDOSCOPY;  Service: Endoscopy;  Laterality: N/A;   GIVENS CAPSULE STUDY N/A 09/26/2016   Procedure: GIVENS CAPSULE STUDY;  Surgeon: Wyline Mood, MD;  Location: [facility] ENDOSCOPY;  Service: Endoscopy;  Laterality: N/A;   HERNIA REPAIR  2013   LEFT HEART CATH AND CORONARY ANGIOGRAPHY N/A 04/29/2022   Procedure: LEFT HEART CATH AND CORONARY ANGIOGRAPHY;  Surgeon: Iran Ouch, MD;  Location: [facility] INVASIVE CV LAB;  Service: Cardiovascular;  Laterality: N/A;   LUMBAR SPINE SURGERY     UPPER GI ENDOSCOPY  Sept 2015   Dr Shelle Iron    Social History   Socioeconomic History   Marital status: Married    Spouse name: Not on file   Number of children: Not on file   Years of education: Not on file   Highest education level: Not on file  Occupational History   Occupation: retired   Tobacco Use   Smoking status: Never   Smokeless tobacco: Never  Vaping Use   Vaping Use: Never used  Substance and Sexual Activity   Alcohol use: No   Drug use: No   Sexual activity: Not Currently  Other Topics Concern   Not on file  Social History Narrative   Not on file   Social Determinants of Health   Financial Resource Strain: Low Risk  (03/02/2019)   Overall Financial Resource Strain (CARDIA)    Difficulty of Paying Living Expenses: Not hard at all  Food Insecurity: No Food Insecurity (07/25/2022)   Hunger Vital Sign    Worried About Running Out of Food in the Last Year: Never true    Ran Out of Food in the Last Year: Never true  Transportation Needs: No Transportation Needs (07/25/2022)   PRAPARE - Administrator, Civil Service (Medical): No    Lack of Transportation (Non-Medical): No  Physical Activity: Not on file  Stress: No Stress Concern Present (09/08/2019)   [employer] of Occupational Health -  Occupational Stress Questionnaire    Feeling of Stress : Not at all  Social Connections: Unknown (09/08/2019)   Social Connection and Isolation Panel [NHANES]    Frequency of Communication with Friends and Family: More than three times a week    Frequency of Social Gatherings with Friends and Family: Once a week    Attends Religious Services: Not on Insurance claims handler of Clubs or Organizations: Yes    Attends Banker Meetings: Not on file    Marital Status: Married  Intimate Partner Violence: Not At Risk (07/25/2022)   Humiliation, Afraid, Rape, and Kick questionnaire    Fear of Current or Ex-Partner: No    Emotionally Abused: No    Physically Abused: No    Sexually Abused: No    Family History  Problem Relation Age of Onset   Stroke Mother    Colon cancer Father    Coronary artery disease  Brother    Other Brother 59       lymes dz     Current Outpatient Medications:    amiodarone (PACERONE) 100 MG tablet, Take 1 tablet (100 mg total) by mouth daily., Disp: 90 tablet, Rfl: 3   fenofibrate micronized (ANTARA) 130 MG capsule, TAKE 1 CAPSULE BY MOUTH  DAILY BEFORE BREAKFAST, Disp: 90 capsule, Rfl: 3   finasteride (PROSCAR) 5 MG tablet, TAKE 1 TABLET BY MOUTH DAILY, Disp: 90 tablet, Rfl: 3   levobunolol (BETAGAN) 0.5 % ophthalmic solution, Place 1 drop into the right eye 2 (two) times daily., Disp: , Rfl:    lisinopril (ZESTRIL) 10 MG tablet, TAKE 1 TABLET BY MOUTH DAILY, Disp: 90 tablet, Rfl: 0   loratadine (CLARITIN) 10 MG tablet, Take 1 tablet (10 mg total) by mouth daily., Disp: 30 tablet, Rfl: 1   Multiple Vitamin (MULTIVITAMIN PO), Take 1 tablet by mouth daily., Disp: , Rfl:    nitroGLYCERIN (NITROSTAT) 0.4 MG SL tablet, DISSOLVE UNDER THE TONGUE 1 TABLET EVERY 5 MINUTES AS NEEDED FOR CHEST PAIN, Disp: 25 tablet, Rfl: 3   pantoprazole (PROTONIX) 40 MG tablet, TAKE ONE (1) TABLET BY MOUTH TWO TIMES PER DAY, Disp: 90 tablet, Rfl: 3   pravastatin (PRAVACHOL) 40 MG  tablet, TAKE 1 TABLET BY MOUTH AT  BEDTIME, Disp: 90 tablet, Rfl: 3   Rivaroxaban (XARELTO) 15 MG TABS tablet, Take 1 tablet (15 mg total) by mouth daily., Disp: 90 tablet, Rfl: 3   zolpidem (AMBIEN) 5 MG tablet, Take 1 tablet (5 mg total) by mouth at bedtime as needed for sleep., Disp: 30 tablet, Rfl: 1  Physical exam:  Vitals:   01/31/23 0954  BP: (!) 149/90  Pulse: 87  Resp: (!) 96  Temp: (!) 94.4 F (34.7 C)  TempSrc: Tympanic  Weight: 196 lb 12.8 oz (89.3 kg)  Height: 6\' 4"  (1.93 m)   Physical Exam Cardiovascular:     Rate and Rhythm: Normal rate. Rhythm irregular.     Heart sounds: Normal heart sounds.  Pulmonary:     Effort: Pulmonary effort is normal.     Breath sounds: Normal breath sounds.  Abdominal:     General: Bowel sounds are normal.     Palpations: Abdomen is soft.  Skin:    General: Skin is warm and dry.  Neurological:     Mental Status: He is alert and oriented to person, place, and time.         Latest Ref Rng & Units 08/20/2022   12:48 PM  CMP  Glucose 70 - 99 mg/dL 97   BUN 8 - 23 mg/dL 22   Creatinine 1.61 - 1.24 mg/dL 0.96   Sodium 045 - 409 mmol/L 140   Potassium 3.5 - 5.1 mmol/L 4.1   Chloride 98 - 111 mmol/L 107   CO2 22 - 32 mmol/L 25   Calcium 8.9 - 10.3 mg/dL 9.4   Total Protein 6.5 - 8.1 g/dL 7.6   Total Bilirubin 0.3 - 1.2 mg/dL 2.1   Alkaline Phos 38 - 126 U/L 32   AST 15 - 41 U/L 29   ALT 0 - 44 U/L 12       Latest Ref Rng & Units 01/06/2023    8:55 AM  CBC  WBC 4.0 - 10.5 K/uL 6.6   Hemoglobin 13.0 - 17.0 g/dL 81.1   Hematocrit 91.4 - 52.0 % 31.2   Platelets 150 - 400 K/uL 269     No images are  attached to the encounter.  No results found.   Assessment and plan- Patient is a 89 y.o. male here for routine follow-up of cold agglutinin disease  Cold agglutinin disease: Patient has low level of hemolysis as evidenced by undetectable haptoglobin but reticulocyte count is not significantly elevated.  Hemoglobin has remained  stable around 10 and therefore he does not require any treatment for cold agglutinin at this time.  I will repeat labs in 4 months and 8 months and see him back in 8 months.  Patient was found to have a small amount of 0.3 g of IgM kappa M protein which can sometimes cause cold agglutinin disease as well.  Will be repeating some of these labs in 4 months.  History of prostate cancer: He followsFor Dr. Lonna Cobb for his BPH and lower urinary tract symptoms for which she is on finasteride.  As per Dr. Heywood Footman note patient declined prostate cancer surveillance in the future but patient states that he would like to continue with PSA surveillance.  His last PSA was undetectable but checked back in 2020.  I am checking his PSA today as per his request.  He was noted to have very low risk disease with a Gleason score 6 back in 2011   Visit Diagnosis 1. Cold agglutinin disease (HCC)   2. History of prostate cancer      Dr. Owens Shark, MD, MPH [facility] [phone] 01/31/2023 1:19 PM

## 2023-02-03 ENCOUNTER — Telehealth: Payer: Self-pay | Admitting: *Deleted

## 2023-02-03 NOTE — Telephone Encounter (Signed)
Called pt and stated his PSA level were undetectable per Dr Smith Robert request.  Pt verbalized understanding.

## 2023-02-03 NOTE — Telephone Encounter (Signed)
Spoke with patient and let him know his PSA is undetectable, and he verbalized that he understood.

## 2023-02-03 NOTE — Telephone Encounter (Signed)
-----   Message from Creig Hines, MD sent at 02/01/2023  5:38 PM EDT ----- Please let him know his PSA is undetectable which is great

## 2023-02-26 ENCOUNTER — Encounter: Payer: Self-pay | Admitting: Emergency Medicine

## 2023-02-26 ENCOUNTER — Emergency Department
Admission: EM | Admit: 2023-02-26 | Discharge: 2023-02-26 | Disposition: A | Discharge status: Home / Self Care | Payer: Medicare HMO | Attending: Emergency Medicine | Admitting: Emergency Medicine

## 2023-02-26 ENCOUNTER — Emergency Department: Payer: Medicare HMO

## 2023-02-26 ENCOUNTER — Other Ambulatory Visit: Payer: Self-pay

## 2023-02-26 DIAGNOSIS — R519 Headache, unspecified: Secondary | ICD-10-CM | POA: Diagnosis not present

## 2023-02-26 DIAGNOSIS — S12191A Other nondisplaced fracture of second cervical vertebra, initial encounter for closed fracture: Secondary | ICD-10-CM | POA: Insufficient documentation

## 2023-02-26 DIAGNOSIS — Z7901 Long term (current) use of anticoagulants: Secondary | ICD-10-CM | POA: Insufficient documentation

## 2023-02-26 DIAGNOSIS — M25512 Pain in left shoulder: Secondary | ICD-10-CM | POA: Insufficient documentation

## 2023-02-26 DIAGNOSIS — S199XXA Unspecified injury of neck, initial encounter: Secondary | ICD-10-CM | POA: Diagnosis present

## 2023-02-26 DIAGNOSIS — E041 Nontoxic single thyroid nodule: Secondary | ICD-10-CM

## 2023-02-26 DIAGNOSIS — Y9301 Activity, walking, marching and hiking: Secondary | ICD-10-CM | POA: Diagnosis not present

## 2023-02-26 DIAGNOSIS — W19XXXA Unspecified fall, initial encounter: Secondary | ICD-10-CM

## 2023-02-26 DIAGNOSIS — W01198A Fall on same level from slipping, tripping and stumbling with subsequent striking against other object, initial encounter: Secondary | ICD-10-CM | POA: Insufficient documentation

## 2023-02-26 DIAGNOSIS — S22029A Unspecified fracture of second thoracic vertebra, initial encounter for closed fracture: Secondary | ICD-10-CM

## 2023-02-26 DIAGNOSIS — Y92002 Bathroom of unspecified non-institutional (private) residence single-family (private) house as the place of occurrence of the external cause: Secondary | ICD-10-CM | POA: Insufficient documentation

## 2023-02-26 DIAGNOSIS — S22020A Wedge compression fracture of second thoracic vertebra, initial encounter for closed fracture: Secondary | ICD-10-CM

## 2023-02-26 HISTORY — DX: Wedge compression fracture of second thoracic vertebra, initial encounter for closed fracture: S22.020A

## 2023-02-26 NOTE — Discharge Instructions (Addendum)
You were seen in the emergency department following a fall.  You had a CT scan done of your head that did not show any internal bleeding.  You had a CT scan of your neck that showed a stable fracture to your second vertebrae.  It was recommended that you follow-up as an outpatient with neurosurgery.  They will be calling you to schedule an outpatient follow-up visit to get x-rays of your neck.  You can alternate Tylenol and Motrin for pain control if you develop any pain.  You can use over-the-counter Lidoderm patches 4%, keep on for 12 hours and then remove, you can get this at your local pharmacy.  Follow-up closely with your primary care physician.  If you develop any worsening pain, numbness or weakness return immediately to the emergency department.

## 2023-02-26 NOTE — ED Notes (Signed)
Patient transported to CT 

## 2023-02-26 NOTE — ED Notes (Signed)
Pt returned from CT °

## 2023-02-26 NOTE — ED Provider Notes (Signed)
[facility] Provider Note    Event Date/Time   First MD Initiated Contact with Patient 02/26/23 [phone]     (approximate)   History   Fall   HPI  JWAN HORNBAKER is a 89 y.o. male past medical history significant for atrial fibrillation on Xarelto who presents to the emergency department following a fall.  Walking to the bathroom with his walker and got tripped up on the wheel of the walker and fell hitting his head.  Complaining of pain to his head.  No neck pain or back pain.  Ambulatory since the fall.  Denies any chest pain, dizziness.  Endorses left shoulder pain but has a known rotator cuff tear.     Physical Exam   Triage Vital Signs: ED Triage Vitals  Enc Vitals Group     BP 02/26/23 0859 110/80     Pulse Rate 02/26/23 0859 71     Resp 02/26/23 0859 18     Temp 02/26/23 0859 (!) 97.4 F (36.3 C)     Temp Source 02/26/23 0859 Oral     SpO2 02/26/23 0859 99 %     Weight 02/26/23 0856 189 lb (85.7 kg)     Height 02/26/23 0856 6\' 4"  (1.93 m)     Head Circumference --      Peak Flow --      Pain Score 02/26/23 0856 2     Pain Loc --      Pain Edu? --      Excl. in GC? --     Most recent vital signs: Vitals:   02/26/23 0859 02/26/23 1024  BP: 110/80 135/86  Pulse: 71 85  Resp: 18 18  Temp: (!) 97.4 F (36.3 C)   SpO2: 99% 98%    Physical Exam HENT:     Head:     Comments: Hematoma to the scalp.  Abrasions but no lacerations.    Right Ear: External ear normal.     Left Ear: External ear normal.  Eyes:     Extraocular Movements: Extraocular movements intact.  Cardiovascular:     Rate and Rhythm: Normal rate.  Pulmonary:     Effort: Pulmonary effort is normal.  Abdominal:     General: There is no distension.  Musculoskeletal:        General: No tenderness. Normal range of motion.     Cervical back: Normal range of motion. No tenderness.     Right lower leg: No edema.     Left lower leg: No edema.     Comments: No tenderness  to the thoracic or lumbar spine.  No tenderness to bilateral pelvis.  Skin:    General: Skin is warm.  Neurological:     Mental Status: He is alert. Mental status is at baseline.     Comments: 5/5 strength bilateral upper extremities.  5/5 strength bilateral lower extremities.  +2 DP and radial pulses.     IMPRESSION / MDM / ASSESSMENT AND PLAN / ED COURSE  I reviewed the triage vital signs and the nursing notes.  Differential diagnosis including intracranial hemorrhage, cervical spine fracture, left shoulder injury.    RADIOLOGY I independently reviewed imaging, my interpretation of imaging: CT of the head without signs of intracranial hemorrhage or infarction.  Read as no acute findings.  CT of the cervical spine with superior endplate fracture at T2.  No tenderness at this point.  LABS (all labs ordered are listed, but only abnormal results  are displayed) Labs interpreted as -    Labs Reviewed - No data to display   MDM  Discussed with Dr. Katrinka Blazing with neurosurgery who recommended outpatient follow-up with no brace at this time.  Will do upright x-rays as an outpatient.  Patient states that he will follow-up with neurosurgery.  Discussed Tylenol and Lidoderm patches.  Given return precautions for any worsening symptoms.  Incidental finding of thyroid nodule.  Discussed this with the patient and his wife, they will follow-up with primary care physician for further workup.     PROCEDURES:  Critical Care performed: No  Procedures  Patient's presentation is most consistent with acute presentation with potential threat to life or bodily function.   MEDICATIONS ORDERED IN ED: Medications - No data to display  FINAL CLINICAL IMPRESSION(S) / ED DIAGNOSES   Final diagnoses:  Fall, initial encounter  Other closed nondisplaced fracture of second cervical vertebra, initial encounter [facility])     Rx / DC Orders   ED Discharge Orders     None        Note:  This  document was prepared using Dragon voice recognition software and may include unintentional dictation errors.   Corena Herter, MD 02/26/23 1039

## 2023-02-26 NOTE — ED Triage Notes (Signed)
Pt via POV from home. Pt c/o a mechanical fall this AM while trying to go to the bathroom. Pt did hit his head on the commode. Denies dizziness. Denies LOC. Denies headache. Pt does take Xarelto. Pt has a hematoma to the R side of his forehead. Pt c/o L shoulder pain, no obvious deformity noted. Pt is A&Ox4 and NAD

## 2023-02-27 ENCOUNTER — Encounter: Payer: Self-pay | Admitting: Gastroenterology

## 2023-02-28 ENCOUNTER — Other Ambulatory Visit: Payer: Self-pay | Admitting: Family Medicine

## 2023-02-28 ENCOUNTER — Telehealth: Payer: Self-pay

## 2023-02-28 DIAGNOSIS — E782 Mixed hyperlipidemia: Secondary | ICD-10-CM

## 2023-02-28 NOTE — Telephone Encounter (Signed)
Please call and set up ED follow up appt.

## 2023-02-28 NOTE — Transitions of Care (Post Inpatient/ED Visit) (Signed)
I spoke with pt; pt fell after getting tangled with walker; hit his head. pt seen [facility] ED 02/26/23; stable fx 2nd vertebrae. Tylenol helping pain.pt already has appt Drake Leach at neurosurgery on 03/19/23 at 11AM. pt will call [facility] later today for thyroid nodule FU. UC & ED precautions reviewed and pt voiced understanding. Sending note to Dr Marikay Alar.     02/28/2023  Name: William Taylor MRN: [id number] DOB: [birth year]  Today's TOC FU Call Status: Today's TOC FU Call Status:: Successful TOC FU Call Competed TOC FU Call Complete Date: 02/28/23  Transition Care Management Follow-up Telephone Call Date of Discharge: 02/26/23 Discharge Facility: [facility]) Type of Discharge: Emergency Department Reason for ED Visit: Other: (pt fell after getting tangled with walker; hit his head. pt seen [facility] ED  02/26/23;  stable fx 2nd vertebrae. Tylenol helping pain.pt already has appt Drake Leach at neurosurgery on 03/19/23 at 11AM. pt will call [facility] later today for thyroid nodule FU.) How have you been since you were released from the hospital?: Better Any questions or concerns?: No  Items Reviewed: Did you receive and understand the discharge instructions provided?: Yes Medications obtained,verified, and reconciled?: Yes (Medications Reviewed) (pt is taking tylenol for pain and that is working well.) Any new allergies since your discharge?: No Dietary orders reviewed?: NA Do you have support at home?: Yes People in Home: spouse Name of Support/Comfort Primary Source: Jasmine December  Medications Reviewed Today: Medications Reviewed Today     Reviewed by Creig Hines, MD (Physician) on 01/31/23 at 1319  Med List Status: <None>   Medication Order Taking? Sig Documenting Provider Last Dose Status Informant  amiodarone (PACERONE) 100 MG tablet [id number] Yes Take 1 tablet (100 mg total) by mouth daily. Antonieta Iba, MD Taking Active Self  fenofibrate micronized  (ANTARA) 130 MG capsule [id number] Yes TAKE 1 CAPSULE BY MOUTH  DAILY BEFORE BREAKFAST Glori Luis, MD Taking Active Self  finasteride (PROSCAR) 5 MG tablet [id number] Yes TAKE 1 TABLET BY MOUTH DAILY Stoioff, Verna Czech, MD Taking Active   levobunolol (BETAGAN) 0.5 % ophthalmic solution [id number] Yes Place 1 drop into the right eye 2 (two) times daily. [provider] Taking Active Self  lisinopril (ZESTRIL) 10 MG tablet [id number] Yes TAKE 1 TABLET BY MOUTH DAILY Gollan, Tollie Pizza, MD Taking Active   loratadine (CLARITIN) 10 MG tablet [id number] Yes Take 1 tablet (10 mg total) by mouth daily. Glori Luis, MD Taking Active Self    Discontinued 01/31/23 0945   Multiple Vitamin (MULTIVITAMIN PO) [id number] Yes Take 1 tablet by mouth daily. [provider] Taking Active Self  nitroGLYCERIN (NITROSTAT) 0.4 MG SL tablet [id number] Yes DISSOLVE UNDER THE TONGUE 1 TABLET EVERY 5 MINUTES AS NEEDED FOR CHEST PAIN Antonieta Iba, MD Taking Active   pantoprazole (PROTONIX) 40 MG tablet [id number] Yes TAKE ONE (1) TABLET BY MOUTH TWO TIMES PER DAY Wyline Mood, MD Taking Active   pravastatin (PRAVACHOL) 40 MG tablet [id number] Yes TAKE 1 TABLET BY MOUTH AT  BEDTIME Worthy Rancher B, FNP Taking Active   Rivaroxaban (XARELTO) 15 MG TABS tablet [id number] Yes Take 1 tablet (15 mg total) by mouth daily. Antonieta Iba, MD Taking Active Self  zolpidem (AMBIEN) 5 MG tablet [id number] Yes Take 1 tablet (5 mg total) by mouth at bedtime as needed for sleep. Eulis Foster, FNP Taking Active             Home Care  and Equipment/Supplies: Were Home Health Services Ordered?: NA Any new equipment or medical supplies ordered?: NA  Functional Questionnaire: Do you need assistance with bathing/showering or dressing?: No Do you need assistance with meal preparation?: No Do you need assistance with eating?: No Do you have difficulty maintaining continence: No Do you need assistance with getting  out of bed/getting out of a chair/moving?: No Do you have difficulty managing or taking your medications?: No  Follow up appointments reviewed: PCP Follow-up appointment confirmed?: No (pt has to leave to make it to an eye appt and pt will call [facility] later today for ED FU appt re thyroid nodule. pt has contact info for office.) MD Provider Line Number:[phone] Given: Yes Specialist Hospital Follow-up appointment confirmed?: Yes Date of Specialist follow-up appointment?: 03/19/23 Follow-Up Specialty Provider:: Drake Leach neurosurgery Do you need transportation to your follow-up appointment?: No Do you understand care options if your condition(s) worsen?: Yes-patient verbalized understanding    SIGNATURE Lewanda Rife, LPN

## 2023-02-28 NOTE — Telephone Encounter (Signed)
Noted. Will await patient call back to schedule an appointment.

## 2023-03-03 NOTE — Telephone Encounter (Signed)
Patient is scheduled for 7/17

## 2023-03-05 ENCOUNTER — Ambulatory Visit: Payer: Medicare HMO | Admitting: Family Medicine

## 2023-03-05 ENCOUNTER — Telehealth: Payer: Self-pay | Admitting: Family Medicine

## 2023-03-05 ENCOUNTER — Ambulatory Visit: Payer: Medicare HMO

## 2023-03-05 ENCOUNTER — Encounter: Payer: Self-pay | Admitting: Family Medicine

## 2023-03-05 ENCOUNTER — Other Ambulatory Visit: Payer: Self-pay | Admitting: Family Medicine

## 2023-03-05 VITALS — BP 132/80 | HR 76 | Temp 97.5°F | Ht 76.0 in | Wt 193.8 lb

## 2023-03-05 DIAGNOSIS — Z8673 Personal history of transient ischemic attack (TIA), and cerebral infarction without residual deficits: Secondary | ICD-10-CM | POA: Insufficient documentation

## 2023-03-05 DIAGNOSIS — G4700 Insomnia, unspecified: Secondary | ICD-10-CM | POA: Diagnosis not present

## 2023-03-05 DIAGNOSIS — S22020A Wedge compression fracture of second thoracic vertebra, initial encounter for closed fracture: Secondary | ICD-10-CM

## 2023-03-05 DIAGNOSIS — R7989 Other specified abnormal findings of blood chemistry: Secondary | ICD-10-CM | POA: Diagnosis not present

## 2023-03-05 DIAGNOSIS — E041 Nontoxic single thyroid nodule: Secondary | ICD-10-CM | POA: Insufficient documentation

## 2023-03-05 DIAGNOSIS — W19XXXA Unspecified fall, initial encounter: Secondary | ICD-10-CM

## 2023-03-05 LAB — LIPID PANEL
Cholesterol: 107 mg/dL (ref 0–200)
HDL: 40 mg/dL (ref >39.00)
LDL Cholesterol: 52 mg/dL (ref 0–99)
NonHDL: 67.21
Total CHOL/HDL Ratio: 3
Triglycerides: 74 mg/dL (ref 0.0–149.0)
VLDL: 14.8 mg/dL (ref 0.0–40.0)

## 2023-03-05 LAB — COMPREHENSIVE METABOLIC PANEL
ALT: 10 U/L (ref 0–53)
AST: 19 U/L (ref 0–37)
Albumin: 4.3 g/dL (ref 3.5–5.2)
Alkaline Phosphatase: 31 U/L — ABNORMAL LOW (ref 39–117)
BUN: 33 mg/dL — ABNORMAL HIGH (ref 6–23)
CO2: 26 mEq/L (ref 19–32)
Calcium: 9.9 mg/dL (ref 8.4–10.5)
Chloride: 105 mEq/L (ref 96–112)
Creatinine, Ser: 1.51 mg/dL — ABNORMAL HIGH (ref 0.40–1.50)
GFR: 40.2 mL/min — ABNORMAL LOW (ref >60.00)
Glucose, Bld: 122 mg/dL — ABNORMAL HIGH (ref 70–99)
Potassium: 4.3 mEq/L (ref 3.5–5.1)
Sodium: 139 mEq/L (ref 135–145)
Total Bilirubin: 1.1 mg/dL (ref 0.2–1.2)
Total Protein: 6.8 g/dL (ref 6.0–8.3)

## 2023-03-05 LAB — TSH: TSH: 9.04 u[IU]/mL — ABNORMAL HIGH (ref 0.35–5.50)

## 2023-03-05 LAB — T4, FREE: Free T4: 1.02 ng/dL (ref 0.60–1.60)

## 2023-03-05 LAB — T3, FREE: T3, Free: 3.4 pg/mL (ref 2.3–4.2)

## 2023-03-05 MED ORDER — ZOLPIDEM TARTRATE 5 MG PO TABS
5.0000 mg | ORAL_TABLET | Freq: Every evening | ORAL | 1 refills | Status: DC | PRN
Start: 2023-03-05 — End: 2023-07-14

## 2023-03-05 NOTE — Assessment & Plan Note (Signed)
Chronic issue.  Continues to have some issues with this.  Discussed keeping a consistent bedtime.  Discussed he could use the Ambien on a nightly basis if needed.  Trazodone would not be an option given that he is on amiodarone.

## 2023-03-05 NOTE — Patient Instructions (Signed)
Nice to see you. Please call [phone] to schedule your bone density scan. 

## 2023-03-05 NOTE — Assessment & Plan Note (Signed)
Found on CT imaging.  Ultrasound ordered today.  We will check TSH though TSH has been mildly elevated in the past so it is unlikely that this is an active nodule.

## 2023-03-05 NOTE — Assessment & Plan Note (Signed)
Related to recent fall.  He notes no pain.  We will order DEXA scan and he will call to schedule this.  He will follow-up with neurosurgery as scheduled.

## 2023-03-05 NOTE — Assessment & Plan Note (Signed)
Chronic issue.  Noted on CT imaging.  Discussed that this could happen at any point.  Discussed managing risk factors.  He is already on Xarelto for his A-fib.  Lipid panel is due.

## 2023-03-05 NOTE — Telephone Encounter (Signed)
Lft pt vm to call ofc to sch US. thanks ?

## 2023-03-05 NOTE — Progress Notes (Signed)
Marikay Alar, MD Phone: [phone]  William Taylor is a 89 y.o. male who presents today for f/u.  Fall: Patient had a fall in the early morning.  He got up to go to the bathroom and was walking with a walker.  He tripped on one of the wheels and fell and hit his head.  He ended up in the emergency department and had a CT of his head that did not show any acute changes.  It did reveal 2 chronic infarcts.  He was found to have a T2 endplate compression fracture.  Neurosurgery was consulted by phone and they noted no brace needed and that he should follow-up in clinic with them.  He notes no pain associated with this.  He has neurosurgery follow-up on 7/31.  He notes he typically only uses a walker at night when he gets up to go to the bathroom.  He does wear slippers when he goes to the bathroom at night as he has wood floors or tile floors.  He does not have any rugs on the way.  Thyroid nodule: Noted on CT imaging.  He notes no known history of this.  Trouble sleeping: Notes at times he will have trouble falling asleep.  If he is not able to fall asleep for a number of hours he will end up taking an Ambien.  He does not take Ambien on a daily basis.  Social History   Tobacco Use  Smoking Status Never  Smokeless Tobacco Never    Current Outpatient Medications on File Prior to Visit  Medication Sig Dispense Refill   amiodarone (PACERONE) 100 MG tablet Take 1 tablet (100 mg total) by mouth daily. 90 tablet 3   fenofibrate micronized (ANTARA) 130 MG capsule TAKE 1 CAPSULE BY MOUTH  DAILY BEFORE BREAKFAST 90 capsule 3   finasteride (PROSCAR) 5 MG tablet TAKE 1 TABLET BY MOUTH DAILY 90 tablet 3   levobunolol (BETAGAN) 0.5 % ophthalmic solution Place 1 drop into the right eye 2 (two) times daily.     lisinopril (ZESTRIL) 10 MG tablet TAKE 1 TABLET BY MOUTH DAILY 90 tablet 0   loratadine (CLARITIN) 10 MG tablet Take 1 tablet (10 mg total) by mouth daily. 30 tablet 1   Multiple Vitamin  (MULTIVITAMIN PO) Take 1 tablet by mouth daily.     nitroGLYCERIN (NITROSTAT) 0.4 MG SL tablet DISSOLVE UNDER THE TONGUE 1 TABLET EVERY 5 MINUTES AS NEEDED FOR CHEST PAIN 25 tablet 3   pantoprazole (PROTONIX) 40 MG tablet TAKE ONE (1) TABLET BY MOUTH TWO TIMES PER DAY 90 tablet 3   pravastatin (PRAVACHOL) 40 MG tablet TAKE 1 TABLET BY MOUTH AT  BEDTIME 90 tablet 3   Rivaroxaban (XARELTO) 15 MG TABS tablet Take 1 tablet (15 mg total) by mouth daily. 90 tablet 3   No current facility-administered medications on file prior to visit.     ROS see history of present illness  Objective  Physical Exam Vitals:   03/05/23 0804 03/05/23 0838  BP: (!) 142/82 132/80  Pulse: 76   Temp: (!) 97.5 F (36.4 C)   SpO2: 98%     BP Readings from Last 3 Encounters:  03/05/23 132/80  02/26/23 135/86  01/31/23 (!) 149/90   Wt Readings from Last 3 Encounters:  03/05/23 193 lb 12.8 oz (87.9 kg)  02/26/23 189 lb (85.7 kg)  01/31/23 196 lb 12.8 oz (89.3 kg)    Physical Exam Constitutional:      General: He is  not in acute distress.    Appearance: He is not diaphoretic.  Neck:     Comments: Possible left-sided thyroid nodule palpated Cardiovascular:     Rate and Rhythm: Normal rate and regular rhythm.     Heart sounds: Normal heart sounds.  Pulmonary:     Effort: Pulmonary effort is normal.     Breath sounds: Normal breath sounds.  Musculoskeletal:     Comments: No midline neck or upper back tenderness or step-off  Skin:    General: Skin is warm and dry.  Neurological:     Mental Status: He is alert.      Assessment/Plan: Please see individual problem list.  Insomnia, unspecified type Assessment & Plan: Chronic issue.  Continues to have some issues with this.  Discussed keeping a consistent bedtime.  Discussed he could use the Ambien on a nightly basis if needed.  Trazodone would not be an option given that he is on amiodarone.  Orders: -     Zolpidem Tartrate; Take 1 tablet (5 mg  total) by mouth at bedtime as needed for sleep.  Dispense: 30 tablet; Refill: 1  Compression fracture of T2 vertebra, initial encounter [facility]) Assessment & Plan: Related to recent fall.  He notes no pain.  We will order DEXA scan and he will call to schedule this.  He will follow-up with neurosurgery as scheduled.  Orders: -     DG Bone Density; Future  Fall, initial encounter Assessment & Plan: Seems to have been mechanical in nature.  Encouraged using slippers on slippery floors.  He will continue to use the walker at night.  He does report having a nightlight so that he has adequate vision.   Cerebral infarction, chronic Assessment & Plan: Chronic issue.  Noted on CT imaging.  Discussed that this could happen at any point.  Discussed managing risk factors.  He is already on Xarelto for his A-fib.  Lipid panel is due.  Orders: -     Lipid panel -     Comprehensive metabolic panel  Thyroid nodule Assessment & Plan: Found on CT imaging.  Ultrasound ordered today.  We will check TSH though TSH has been mildly elevated in the past so it is unlikely that this is an active nodule.  Orders: -     TSH -     US THYROID; Future    Return in about 6 months (around 09/05/2023).   Marikay Alar, MD [facility]

## 2023-03-05 NOTE — Assessment & Plan Note (Signed)
Seems to have been mechanical in nature.  Encouraged using slippers on slippery floors.  He will continue to use the walker at night.  He does report having a nightlight so that he has adequate vision.

## 2023-03-12 NOTE — Progress Notes (Addendum)
Referring Physician:  Glori Luis, MD [address] [place],  [place] [address]  Primary Physician:  Glori Luis, MD  History of Present Illness: 03/19/2023 [name] has a history of HTN, afib, CAD, GERD, CKD, BPH, history of PE, history of GI bleed.   Seen in ED on 02/26/23 s/p fall PTA. CT showed fracture at T2. ED reviewed with Dr. Katrinka Blazing and no brace needed. PCP has seen him and ordered DEXA scan.   He was to f/u with PCP regarding incidental finding of thyroid nodule.   He is here for follow up.   He states he had no pain in the ED. He has no neck or arm pain currently. No numbness, tingling, or weakness in his arms. No radiation of pain into his chest/ribs.   He is taking XARELTO.   Past Surgery: lumbar surgery in [place] in the 90's  Review of Systems:  A 10 point review of systems is negative, except for the pertinent positives and negatives detailed in the HPI.  Past Medical History: Past Medical History:  Diagnosis Date   Cancer [facility])    Prostate, followed by Dr. Achilles Dunk   Colon polyp    Coronary artery disease    a. 2005 s/p PCI LAD ([facility]); b 2012 s/p PCI OM2; c.10/2013 Cath: LAD 25m ISR, 74m, D1 90, D2 70, OM2 80 w/ patent stent;  d. 06/2019 Low risk MV; e. 07/2020 MV: EF 77%, no ischemia/infarct.   Gall stones    Hyperlipidemia    Hypertension    MI (myocardial infarction) (HCC)    2015   Orthostatic hypotension    a. prev on midodrine/florinef/northera.   PAF (paroxysmal atrial fibrillation) (HCC)    a. on xarelto and amio (CHA2DS2VASc = 4).   Skin cancer     Past Surgical History: Past Surgical History:  Procedure Laterality Date   CARDIAC CATHETERIZATION  10/17/2013   [facility]   CATARACT EXTRACTION  05/22/2011   right eye   colonoscopy     COLONOSCOPY W/ POLYPECTOMY  08/19/2013   Dr Shelle Iron   COLONOSCOPY WITH PROPOFOL N/A 08/15/2016   Procedure: COLONOSCOPY WITH PROPOFOL;  Surgeon: Wyline Mood, MD;  Location: [facility] ENDOSCOPY;   Service: Endoscopy;  Laterality: N/A;   CORONARY ANGIOPLASTY  08/20/2007   2005; s/p stent   ENTEROSCOPY N/A 09/22/2018   Procedure: ENTEROSCOPY;  Surgeon: Toney Reil, MD;  Location: [facility] ENDOSCOPY;  Service: Gastroenterology;  Laterality: N/A;   ESOPHAGOGASTRODUODENOSCOPY (EGD) WITH PROPOFOL N/A 08/15/2016   Procedure: ESOPHAGOGASTRODUODENOSCOPY (EGD) WITH PROPOFOL;  Surgeon: Wyline Mood, MD;  Location: [facility] ENDOSCOPY;  Service: Endoscopy;  Laterality: N/A;   GIVENS CAPSULE STUDY N/A 09/26/2016   Procedure: GIVENS CAPSULE STUDY;  Surgeon: Wyline Mood, MD;  Location: [facility] ENDOSCOPY;  Service: Endoscopy;  Laterality: N/A;   HERNIA REPAIR  08/20/2011   LEFT HEART CATH AND CORONARY ANGIOGRAPHY N/A 04/29/2022   Procedure: LEFT HEART CATH AND CORONARY ANGIOGRAPHY;  Surgeon: Iran Ouch, MD;  Location: [facility] INVASIVE CV LAB;  Service: Cardiovascular;  Laterality: N/A;   LUMBAR SPINE SURGERY     x2 in [facility] in 1990's   UPPER GI ENDOSCOPY  04/19/2014   Dr Shelle Iron    Allergies: Allergies as of 03/19/2023 - Review Complete 03/19/2023  Allergen Reaction Noted   Bee venom Swelling 07/17/2015   Sulfasalazine Hives and Swelling 01/22/2016   Sulfa antibiotics Hives and Swelling 01/22/2016   Warfarin  06/12/2021   Warfarin and related Other (See Comments)  05/18/2012    Medications: Outpatient Encounter Medications as of 03/19/2023  Medication Sig   amiodarone (PACERONE) 100 MG tablet Take 1 tablet (100 mg total) by mouth daily.   fenofibrate micronized (ANTARA) 130 MG capsule TAKE 1 CAPSULE BY MOUTH DAILY  BEFORE BREAKFAST   finasteride (PROSCAR) 5 MG tablet TAKE 1 TABLET BY MOUTH DAILY   levobunolol (BETAGAN) 0.5 % ophthalmic solution Place 1 drop into the right eye 2 (two) times daily.   lisinopril (ZESTRIL) 10 MG tablet TAKE 1 TABLET BY MOUTH DAILY   loratadine (CLARITIN) 10 MG tablet Take 1 tablet (10 mg total) by mouth daily.   Multiple Vitamin (MULTIVITAMIN PO) Take 1 tablet by mouth  daily.   nitroGLYCERIN (NITROSTAT) 0.4 MG SL tablet DISSOLVE UNDER THE TONGUE 1 TABLET EVERY 5 MINUTES AS NEEDED FOR CHEST PAIN   pantoprazole (PROTONIX) 40 MG tablet TAKE ONE (1) TABLET BY MOUTH TWO TIMES PER DAY   pravastatin (PRAVACHOL) 40 MG tablet TAKE 1 TABLET BY MOUTH AT  BEDTIME   Rivaroxaban (XARELTO) 15 MG TABS tablet Take 1 tablet (15 mg total) by mouth daily.   zolpidem (AMBIEN) 5 MG tablet Take 1 tablet (5 mg total) by mouth at bedtime as needed for sleep.   No facility-administered encounter medications on file as of 03/19/2023.    Social History: Social History   Tobacco Use   Smoking status: Never   Smokeless tobacco: Never  Vaping Use   Vaping status: Never Used  Substance Use Topics   Alcohol use: No   Drug use: No    Family Medical History: Family History  Problem Relation Age of Onset   Stroke Mother    Colon cancer Father    Coronary artery disease Brother    Other Brother 36       lymes dz    Physical Examination: Vitals:   03/19/23 1041  BP: 136/74    General: Patient is well developed, well nourished, calm, collected, and in no apparent distress. Attention to examination is appropriate.  Respiratory: Patient is breathing without any difficulty.   NEUROLOGICAL:     Awake, alert, oriented to person, place, and time.  Speech is clear and fluent. Fund of knowledge is appropriate.   Cranial Nerves: Pupils equal round and reactive to light.  Facial tone is symmetric.    No posterior cervical/thoracic tenderness. No tenderness in bilateral trapezial region.   No abnormal lesions on exposed skin.   Strength: Side Biceps Triceps Deltoid Interossei Grip Wrist Ext. Wrist Flex.  R 5 5 5 5 5 5 5   L 5 5 5 5 5 5 5    Side Iliopsoas Quads Hamstring PF DF EHL  R 5 5 5 5 5  ---  L 5 5 5 5 5 5    No gross weakness in right DF/EHL, but has history of injury to right great toe with limited ROM.   Reflexes are 2+ and symmetric at the biceps,  brachioradialis, patella and achilles.   Hoffman's is absent.  Clonus is not present.   Bilateral upper and lower extremity sensation is intact to light touch.     He ambulates with a cane.   Medical Decision Making  Imaging: Cervical xrays dated 03/19/23:  T2 fracture is difficult to visualize.   Above radiology report not yet available.   Above xrays reviewed with Dr. Katrinka Blazing.    CT cervical spine dated 02/26/23:  CT CERVICAL SPINE FINDINGS   Alignment: Trace anterolisthesis of C4 on C5.  Skull base and vertebrae: No acute cervical spine fracture. No primary bone lesion or focal pathologic process. There is an acute appearing superior endplate compression deformity at T2   Soft tissues and spinal canal: No prevertebral fluid or swelling. No visible canal hematoma.   Disc levels:  No evidence of high-grade spinal canal stenosis.   Upper chest: Negative   Other: There is a 1.7 cm left thyroid nodule (series 4, image 94). Recommend further evaluation with dedicated thyroid ultrasound, if not previously performed.   IMPRESSION: 1. No acute intracranial abnormality. 2. Soft tissue swelling along the right frontal scalp. No evidence of an underlying calvarial fracture. 3. No acute cervical spine fracture. 4. Acute superior endplate compression deformity at T2. 5. There is a 1.7 cm left thyroid nodule. Recommend further evaluation with dedicated thyroid ultrasound, if not previously performed.     Electronically Signed   By: Lorenza Cambridge M.D.   On: 02/26/2023 09:53   I have personally reviewed the images and agree with the above interpretation.  Assessment and Plan: Mr. Friedman is a pleasant 89 y.o. male had a fall on 02/26/23 and CT scan showed a T2 fracture. He had no pain after the fall. He has no current neck or back pain. No arm/leg pain. No numbness, tingling or weakness.   Hard to visualize T2 fracture on xrays. Imaging reviewed with Dr. Katrinka Blazing.   Treatment  options discussed with patient and following plan made with Dr. Katrinka Blazing:   - He may continue with activity as tolerated.  - Call if he develops any neck/thoracic pain or any arm leg pain/weakness.  - He will follow up prn.   I spent a total of 20 minutes in face-to-face and non-face-to-face activities related to this patient's care today including review of outside records, review of imaging, review of symptoms, physical exam, discussion of differential diagnosis, discussion of treatment options, and documentation.   Thank you for involving me in the care of this patient.   Drake Leach PA-C Dept. of Neurosurgery

## 2023-03-13 ENCOUNTER — Ambulatory Visit
Admission: RE | Admit: 2023-03-13 | Discharge: 2023-03-13 | Disposition: A | Discharge status: Home / Self Care | Payer: Medicare HMO | Source: Ambulatory Visit | Attending: Family Medicine | Admitting: Family Medicine

## 2023-03-13 DIAGNOSIS — E041 Nontoxic single thyroid nodule: Secondary | ICD-10-CM | POA: Insufficient documentation

## 2023-03-14 ENCOUNTER — Other Ambulatory Visit: Payer: Self-pay | Admitting: Orthopedic Surgery

## 2023-03-14 DIAGNOSIS — S22029A Unspecified fracture of second thoracic vertebra, initial encounter for closed fracture: Secondary | ICD-10-CM

## 2023-03-17 ENCOUNTER — Encounter: Payer: Self-pay | Admitting: Family Medicine

## 2023-03-17 ENCOUNTER — Telehealth: Payer: Self-pay

## 2023-03-17 DIAGNOSIS — E042 Nontoxic multinodular goiter: Secondary | ICD-10-CM

## 2023-03-17 NOTE — Telephone Encounter (Signed)
Left message to call the office back regarding William Taylor's message below.

## 2023-03-17 NOTE — Telephone Encounter (Signed)
-----   Message from Eulis Foster sent at 03/16/2023 12:33 PM EDT ----- Thyroid is diffusely enlarged. Are you willing to have a biopsy performed so that we can see what is actually happening?

## 2023-03-17 NOTE — Telephone Encounter (Signed)
Patient states he would really like to wait on Dr. Birdie Sons to get back next week.

## 2023-03-19 ENCOUNTER — Encounter: Payer: Self-pay | Admitting: Orthopedic Surgery

## 2023-03-19 ENCOUNTER — Ambulatory Visit
Admission: RE | Admit: 2023-03-19 | Discharge: 2023-03-19 | Disposition: A | Discharge status: Home / Self Care | Payer: Medicare HMO | Attending: Orthopedic Surgery | Admitting: Orthopedic Surgery

## 2023-03-19 ENCOUNTER — Ambulatory Visit: Admission: RE | Admit: 2023-03-19 | Payer: Medicare HMO | Source: Ambulatory Visit

## 2023-03-19 ENCOUNTER — Ambulatory Visit: Payer: Medicare HMO | Admitting: Orthopedic Surgery

## 2023-03-19 VITALS — BP 136/74 | Ht 76.0 in | Wt 193.0 lb

## 2023-03-19 DIAGNOSIS — S22029A Unspecified fracture of second thoracic vertebra, initial encounter for closed fracture: Secondary | ICD-10-CM | POA: Diagnosis present

## 2023-03-19 DIAGNOSIS — S22020A Wedge compression fracture of second thoracic vertebra, initial encounter for closed fracture: Secondary | ICD-10-CM

## 2023-03-19 DIAGNOSIS — W19XXXA Unspecified fall, initial encounter: Secondary | ICD-10-CM | POA: Diagnosis not present

## 2023-03-24 NOTE — Telephone Encounter (Signed)
Based on his ultrasound report radiology noted he does not need any biopsy of his thyroid.  There is 1 additional lab test that we could consider doing to evaluate for a cause of his elevated TSH and multinodular thyroid.  I have ordered that and he can be scheduled for that at his convenience.

## 2023-03-24 NOTE — Telephone Encounter (Signed)
Patient is scheduled for 03/27/23 at 11:00.

## 2023-03-24 NOTE — Telephone Encounter (Signed)
Patient called office and would like Dr Birdie Sons to call him on his cell phone, [phone].

## 2023-03-27 ENCOUNTER — Other Ambulatory Visit (INDEPENDENT_AMBULATORY_CARE_PROVIDER_SITE_OTHER): Payer: Medicare HMO

## 2023-03-27 DIAGNOSIS — E042 Nontoxic multinodular goiter: Secondary | ICD-10-CM

## 2023-04-04 ENCOUNTER — Other Ambulatory Visit: Payer: Self-pay | Admitting: Cardiovascular Disease

## 2023-04-07 NOTE — Telephone Encounter (Signed)
Refill request for Xarelto 

## 2023-04-07 NOTE — Telephone Encounter (Signed)
Pt is scheduled on 10/18

## 2023-04-07 NOTE — Telephone Encounter (Signed)
Good Morning,  Could you please schedule this patient a 6 month follow up appointment? The patient was last seen by Dr. Mariah Milling on 10-07-2022. Thank you so much.

## 2023-04-07 NOTE — Telephone Encounter (Signed)
Prescription refill request for Xarelto received.  Indication:afib Last office visit:2/24 Weight:87.5 kg Age:89 Scr:1.51 CrCl:39.44  ml/min  Prescription refilled

## 2023-04-20 ENCOUNTER — Emergency Department
Admission: EM | Admit: 2023-04-20 | Discharge: 2023-04-20 | Disposition: A | Discharge status: Home / Self Care | Payer: Medicare HMO | Source: Home / Self Care | Attending: Emergency Medicine | Admitting: Emergency Medicine

## 2023-04-20 ENCOUNTER — Encounter: Payer: Self-pay | Admitting: Emergency Medicine

## 2023-04-20 ENCOUNTER — Emergency Department: Payer: Medicare HMO

## 2023-04-20 ENCOUNTER — Other Ambulatory Visit: Payer: Self-pay

## 2023-04-20 DIAGNOSIS — I2510 Atherosclerotic heart disease of native coronary artery without angina pectoris: Secondary | ICD-10-CM | POA: Diagnosis not present

## 2023-04-20 DIAGNOSIS — R0789 Other chest pain: Secondary | ICD-10-CM | POA: Diagnosis present

## 2023-04-20 DIAGNOSIS — R079 Chest pain, unspecified: Secondary | ICD-10-CM

## 2023-04-20 DIAGNOSIS — I509 Heart failure, unspecified: Secondary | ICD-10-CM | POA: Insufficient documentation

## 2023-04-20 DIAGNOSIS — Z7901 Long term (current) use of anticoagulants: Secondary | ICD-10-CM | POA: Insufficient documentation

## 2023-04-20 DIAGNOSIS — E119 Type 2 diabetes mellitus without complications: Secondary | ICD-10-CM | POA: Insufficient documentation

## 2023-04-20 LAB — CBC
HCT: 32 % — ABNORMAL LOW (ref 39.0–52.0)
Hemoglobin: 10.5 g/dL — ABNORMAL LOW (ref 13.0–17.0)
MCH: 33.4 pg (ref 26.0–34.0)
MCHC: 32.8 g/dL (ref 30.0–36.0)
MCV: 101.9 fL — ABNORMAL HIGH (ref 80.0–100.0)
Platelets: 256 10*3/uL (ref 150–400)
RBC: 3.14 MIL/uL — ABNORMAL LOW (ref 4.22–5.81)
RDW: 14.9 % (ref 11.5–15.5)
WBC: 5.7 10*3/uL (ref 4.0–10.5)
nRBC: 0 % (ref 0.0–0.2)

## 2023-04-20 LAB — BASIC METABOLIC PANEL
Anion gap: 11 (ref 5–15)
BUN: 33 mg/dL — ABNORMAL HIGH (ref 8–23)
CO2: 22 mmol/L (ref 22–32)
Calcium: 9.4 mg/dL (ref 8.9–10.3)
Chloride: 103 mmol/L (ref 98–111)
Creatinine, Ser: 1.41 mg/dL — ABNORMAL HIGH (ref 0.61–1.24)
GFR, Estimated: 47 mL/min — ABNORMAL LOW (ref >60)
Glucose, Bld: 180 mg/dL — ABNORMAL HIGH (ref 70–99)
Potassium: 4.4 mmol/L (ref 3.5–5.1)
Sodium: 136 mmol/L (ref 135–145)

## 2023-04-20 LAB — TROPONIN I (HIGH SENSITIVITY): Troponin I (High Sensitivity): 9 ng/L (ref <18)

## 2023-04-20 NOTE — ED Provider Notes (Signed)
[facility] Provider Note   Event Date/Time   First MD Initiated Contact with Patient 04/20/23 1202     (approximate) History  Chest Pain  HPI William Taylor is a 89 y.o. male with a past medical history of heart failure, CAD, and type 2 diabetes who presents from church after experiencing an episode of chest pain.  Patient states that he has had similar episodes of similar pain in the past that have had "negative workup".  Patient sees Dr. Mariah Milling in cardiology for his cardiology care.  Patient states that when he was in church today he stood up from a seated position and felt a substernal chest pressure that was present and did not worsen with exertion but did resolve after 2 sublingual nitroglycerin and has not recurred.  Patient states once again that this is similar to previous episodes that he has had in the past and is currently being worked up by his cardiologist.  Patient also states that he has a history of paroxysmal atrial fibrillation and is currently on Xarelto. ROS: Patient currently denies any vision changes, tinnitus, difficulty speaking, facial droop, sore throat, abdominal pain, nausea/vomiting/diarrhea, dysuria, or weakness/numbness/paresthesias in any extremity   Physical Exam  Triage Vital Signs: ED Triage Vitals  Encounter Vitals Group     BP 04/20/23 1154 110/69     Systolic BP Percentile --      Diastolic BP Percentile --      Pulse Rate 04/20/23 1154 88     Resp 04/20/23 1154 17     Temp 04/20/23 1154 97.8 F (36.6 C)     Temp Source 04/20/23 1154 Oral     SpO2 04/20/23 1154 95 %     Weight 04/20/23 1149 192 lb 14.4 oz (87.5 kg)     Height 04/20/23 1149 6\' 4"  (1.93 m)     Head Circumference --      Peak Flow --      Pain Score 04/20/23 1149 5     Pain Loc --      Pain Education --      Exclude from Growth Chart --    Most recent vital signs: Vitals:   04/20/23 1154  BP: 110/69  Pulse: 88  Resp: 17  Temp: 97.8 F (36.6 C)   SpO2: 95%   General: Awake, oriented x4. CV:  Good peripheral perfusion.  Resp:  Normal effort.  Abd:  No distention.  Other:  Elderly well-developed, well-nourished Caucasian male laying in bed in no acute distress ED Results / Procedures / Treatments  Labs (all labs ordered are listed, but only abnormal results are displayed) Labs Reviewed  BASIC METABOLIC PANEL - Abnormal; Notable for the following components:      Result Value   Glucose, Bld 180 (*)    BUN 33 (*)    Creatinine, Ser 1.41 (*)    GFR, Estimated 47 (*)    All other components within normal limits  CBC - Abnormal; Notable for the following components:   RBC 3.14 (*)    Hemoglobin 10.5 (*)    HCT 32.0 (*)    MCV 101.9 (*)    All other components within normal limits  TROPONIN I (HIGH SENSITIVITY)  TROPONIN I (HIGH SENSITIVITY)   EKG ED ECG REPORT I, Merwyn Katos, the attending physician, personally viewed and interpreted this ECG. Date: 04/20/2023 EKG Time: 1150 Rate: 89 Rhythm: normal sinus rhythm QRS Axis: normal Intervals: normal ST/T Wave abnormalities: normal Narrative  Interpretation: no evidence of acute ischemia RADIOLOGY ED MD interpretation: 2 view chest x-ray interpreted by me shows no evidence of acute abnormalities including no pneumonia, pneumothorax, or widened mediastinum -Agree with radiology assessment Official radiology report(s): DG Chest 2 View  Result Date: 04/20/2023 CLINICAL DATA:  Chest pain that started today. EXAM: CHEST - 2 VIEW COMPARISON:  Radiographs 07/24/2022 and 04/27/2022.  CT 07/24/2022. FINDINGS: The heart size and mediastinal contours are stable. There are aortic and coronary artery calcifications as well as a probable coronary artery stent. The lungs are clear. There is no pleural effusion or pneumothorax. The bones appear unchanged. There are glenohumeral degenerative changes bilaterally without evidence of a chronic rotator cuff tear on the left. Mild thoracic spine  degenerative changes. IMPRESSION: No evidence of acute cardiopulmonary process. Stable chronic findings. Electronically Signed   By: Carey Bullocks M.D.   On: 04/20/2023 12:31   PROCEDURES: Critical Care performed: No .1-3 Lead EKG Interpretation  Performed by: Merwyn Katos, MD Authorized by: Merwyn Katos, MD     Interpretation: normal     ECG rate:  71   ECG rate assessment: normal     Rhythm: sinus rhythm     Ectopy: none     Conduction: normal    MEDICATIONS ORDERED IN ED: Medications - No data to display IMPRESSION / MDM / ASSESSMENT AND PLAN / ED COURSE  I reviewed the triage vital signs and the nursing notes.                             The patient is on the cardiac monitor to evaluate for evidence of arrhythmia and/or significant heart rate changes. Patient's presentation is most consistent with acute presentation with potential threat to life or bodily function. Workup: ECG, CXR, CBC, BMP, Troponin Findings: ECG: No overt evidence of STEMI. No evidence of Brugada's sign, delta wave, epsilon wave, significantly prolonged QTc, or malignant arrhythmia HS Troponin: Negative x1 Other Labs unremarkable for emergent problems. CXR: Without PTX, PNA, or widened mediastinum Last Stress Test:  06/2019 Last Heart Catheterization:  2015 HEART Score: 4  Given History, Exam, and Workup I have low suspicion for ACS, Pneumothorax, Pneumonia, Pulmonary Embolus, Tamponade, Aortic Dissection or other emergent problem as a cause for this presentation.   Reassesment: Prior to discharge patient's pain was controlled and they were well appearing.  I discussed with patient at length the possibility of admission for further observation however patient continued to states that this is similar to episodes that he has had in the past and given that his chest pain is resolved he wishes to follow-up with his cardiologist.  Given patient has had negative troponin and no new EKG changes, I feel that  this is reasonable at this time with instructions to follow-up in the next 1-3 days given his elevated heart score.  Disposition:  Discharge. Strict return precautions discussed with patient with full understanding. Advised patient to follow up promptly with primary care provider    FINAL CLINICAL IMPRESSION(S) / ED DIAGNOSES   Final diagnoses:  Chest pain, unspecified type   Rx / DC Orders   ED Discharge Orders          Ordered    Ambulatory referral to Cardiology       Comments: If you have not heard from the Cardiology office within the next 72 hours please call [phone].   04/20/23 1344  Note:  This document was prepared using Dragon voice recognition software and may include unintentional dictation errors.   Merwyn Katos, MD 04/20/23 [phone]

## 2023-04-20 NOTE — ED Notes (Signed)
See triage notes. Patient was at church when he began having chest pains. Stated he took two nitro

## 2023-04-20 NOTE — ED Triage Notes (Signed)
Pt here with cp that started today.  Pt states pain is centered and radiates to his back. Pt states his pain was constant until he took his 2 nitroglycerin tablets. Pt states pain was tight and squeezing when it was at its worse now he describes it as dull. Pt denies NVD.

## 2023-04-23 ENCOUNTER — Telehealth: Payer: Self-pay

## 2023-04-23 NOTE — Transitions of Care (Post Inpatient/ED Visit) (Addendum)
Unable to reach pt by phone, no answer and no V/M;will try call again later. Pt already has appt scheduled with Dr Birdie Sons on 04/30/23 and [facility]ut[name] on 05/02/23.       04/23/2023  Name: William Taylor MRN: [id number] DOB: 05/26/1930  Today's TOC FU Call Status: Today's TOC FU Call Status:: Unsuccessful Call (1st Attempt) Unsuccessful Call (1st Attempt) Date: 04/23/23  Attempted to reach the patient regarding the most recent Inpatient/ED visit.  Follow Up Plan: Additional outreach attempts will be made to reach the patient to complete the Transitions of Care (Post Inpatient/ED visit) call.   Signature Lewanda Rife, LPN

## 2023-04-25 NOTE — Transitions of Care (Post Inpatient/ED Visit) (Addendum)
I spoke with pt;pt seen [facility] ED 04/20/23 for mid CP and pain across shoulder blades;pt had SOB and sweating but no N&V pt took 2 ntg that helped. Pt said ED was not sure what caused CP but pt already has appt with Dr Birdie Sons on 04/30/23 and cardiology on 05/02/23. UC & ED precautions given and pt voiced understanding.Pt is feeling better today. Sending note to Dr Birdie Sons.and note to Worthy Rancher FNP who is covering for PCP.      04/25/2023  Name: William Taylor MRN: [id number] DOB: November 25, 1929  Today's TOC FU Call Status: Today's TOC FU Call Status:: Successful TOC FU Call Completed Unsuccessful Call (1st Attempt) Date: 04/23/23 [facility] FU Call Complete Date: 04/25/23 Patient's Name and Date of Birth confirmed.  Transition Care Management Follow-up Telephone Call Date of Discharge: 04/20/23 Discharge Facility: [facility]) Type of Discharge: Emergency Department Reason for ED Visit: Other: (pt [facility] ED 04/20/23 for mid CP and across shoulder blades;pt had SOB and sweating but no N&V pt took 2 ntg that helped.) How have you been since you were released from the hospital?: Better Any questions or concerns?: No  Items Reviewed: Did you receive and understand the discharge instructions provided?: Yes Medications obtained,verified, and reconciled?: Yes (Medications Reviewed) (no new med rx given at end of ED visit.) Any new allergies since your discharge?: No Dietary orders reviewed?: NA Do you have support at home?: Yes People in Home: spouse Name of Support/Comfort Primary Source: Jasmine December  Medications Reviewed Today: Medications Reviewed Today     Reviewed by Patience Musca, LPN (Licensed Practical Nurse) on 04/25/23 at 1126  Med List Status: <None>   Medication Order Taking? Sig Documenting Provider Last Dose Status Informant  amiodarone (PACERONE) 100 MG tablet [id number]  TAKE 1 TABLET BY MOUTH DAILY Gollan, Tollie Pizza, MD  Active   fenofibrate  micronized (ANTARA) 130 MG capsule [id number]  TAKE 1 CAPSULE BY MOUTH DAILY  BEFORE BREAKFAST Glori Luis, MD  Active   finasteride (PROSCAR) 5 MG tablet [id number] No TAKE 1 TABLET BY MOUTH DAILY Stoioff, Verna Czech, MD Taking Active   levobunolol (BETAGAN) 0.5 % ophthalmic solution [id number] No Place 1 drop into the right eye 2 (two) times daily. [provider] Taking Active Self  lisinopril (ZESTRIL) 10 MG tablet [id number]  TAKE 1 TABLET BY MOUTH DAILY Gollan, Tollie Pizza, MD  Active   loratadine (CLARITIN) 10 MG tablet [id number] No Take 1 tablet (10 mg total) by mouth daily. Glori Luis, MD Taking Active Self  Multiple Vitamin (MULTIVITAMIN PO) [id number] No Take 1 tablet by mouth daily. [provider] Taking Active Self  nitroGLYCERIN (NITROSTAT) 0.4 MG SL tablet [id number] No DISSOLVE UNDER THE TONGUE 1 TABLET EVERY 5 MINUTES AS NEEDED FOR CHEST PAIN Antonieta Iba, MD Taking Active   pantoprazole (PROTONIX) 40 MG tablet [id number] No TAKE ONE (1) TABLET BY MOUTH TWO TIMES PER DAY Wyline Mood, MD Taking Active   pravastatin (PRAVACHOL) 40 MG tablet [id number] No TAKE 1 TABLET BY MOUTH AT  BEDTIME Worthy Rancher B, FNP Taking Active   XARELTO 15 MG TABS tablet [id number]  TAKE 1 TABLET BY MOUTH DAILY Gollan, Tollie Pizza, MD  Active   zolpidem (AMBIEN) 5 MG tablet [id number]  Take 1 tablet (5 mg total) by mouth at bedtime as needed for sleep. Glori Luis, MD  Active             Home Care and Equipment/Supplies: Were  Home Health Services Ordered?: NA Any new equipment or medical supplies ordered?: NA  Functional Questionnaire: Do you need assistance with bathing/showering or dressing?: No Do you need assistance with meal preparation?: No Do you need assistance with eating?: No Do you have difficulty maintaining continence: No Do you need assistance with getting out of bed/getting out of a chair/moving?: No Do you have difficulty managing or taking your  medications?: No  Follow up appointments reviewed: PCP Follow-up appointment confirmed?: Yes MD Provider Line Number:[phone] Given: Yes Date of PCP follow-up appointment?: 04/30/23 Follow-up Provider: Dr Birdie Sons [facility] Follow-up appointment confirmed?: Yes Date of Specialist follow-up appointment?: 05/02/23 Follow-Up Specialty Provider:: cardiology Do you need transportation to your follow-up appointment?: No Do you understand care options if your condition(s) worsen?: Yes-patient verbalized understanding    SIGNATURE Lewanda Rife, LPN

## 2023-04-30 ENCOUNTER — Ambulatory Visit: Payer: Medicare HMO | Admitting: Family Medicine

## 2023-04-30 ENCOUNTER — Encounter: Payer: Self-pay | Admitting: Family Medicine

## 2023-04-30 VITALS — BP 110/66 | HR 80 | Temp 98.0°F | Ht 76.0 in | Wt 191.2 lb

## 2023-04-30 DIAGNOSIS — I25118 Atherosclerotic heart disease of native coronary artery with other forms of angina pectoris: Secondary | ICD-10-CM | POA: Diagnosis not present

## 2023-04-30 DIAGNOSIS — R5383 Other fatigue: Secondary | ICD-10-CM | POA: Diagnosis not present

## 2023-04-30 DIAGNOSIS — Z23 Encounter for immunization: Secondary | ICD-10-CM | POA: Diagnosis not present

## 2023-04-30 DIAGNOSIS — J309 Allergic rhinitis, unspecified: Secondary | ICD-10-CM

## 2023-04-30 DIAGNOSIS — R2689 Other abnormalities of gait and mobility: Secondary | ICD-10-CM

## 2023-04-30 MED ORDER — NITROGLYCERIN 0.4 MG SL SUBL: SUBLINGUAL_TABLET | SUBLINGUAL | 3 refills | Status: DC

## 2023-04-30 NOTE — Progress Notes (Signed)
Marikay Alar, MD Phone: [phone]  William Taylor is a 89 y.o. male who presents today for follow-up.  Chronic fatigue: Patient notes exertional fatigue.  He also had chest pain and shortness of breath recently and he went to the ED.  Workup was reassuring.  Notes chest pain and shortness of breath comes and goes.  Notes that episode of chest pain resolved with 2 nitroglycerin.  Phlegm in throat: Patient notes this has been going on several months.  He has rare cough.  No reflux.  No dysphagia.  Occasional sneezing.  Does have a history of allergies though infrequently takes Claritin.  Balance difficulty: This is a chronic ongoing issue.  He uses a cane when he walks.  He uses a walker at night at home.  No recent falls.  No tingling in his feet though he does report being diagnosed with neuropathy previously.  Social History   Tobacco Use  Smoking Status Never  Smokeless Tobacco Never    Current Outpatient Medications on File Prior to Visit  Medication Sig Dispense Refill   amiodarone (PACERONE) 100 MG tablet TAKE 1 TABLET BY MOUTH DAILY 90 tablet 0   fenofibrate micronized (ANTARA) 130 MG capsule TAKE 1 CAPSULE BY MOUTH DAILY  BEFORE BREAKFAST 90 capsule 3   finasteride (PROSCAR) 5 MG tablet TAKE 1 TABLET BY MOUTH DAILY 90 tablet 3   levobunolol (BETAGAN) 0.5 % ophthalmic solution Place 1 drop into the right eye 2 (two) times daily.     lisinopril (ZESTRIL) 10 MG tablet TAKE 1 TABLET BY MOUTH DAILY 90 tablet 0   loratadine (CLARITIN) 10 MG tablet Take 1 tablet (10 mg total) by mouth daily. 30 tablet 1   Multiple Vitamin (MULTIVITAMIN PO) Take 1 tablet by mouth daily.     pantoprazole (PROTONIX) 40 MG tablet TAKE ONE (1) TABLET BY MOUTH TWO TIMES PER DAY 90 tablet 3   pravastatin (PRAVACHOL) 40 MG tablet TAKE 1 TABLET BY MOUTH AT  BEDTIME 90 tablet 3   XARELTO 15 MG TABS tablet TAKE 1 TABLET BY MOUTH DAILY 90 tablet 3   zolpidem (AMBIEN) 5 MG tablet Take 1 tablet (5 mg total)  by mouth at bedtime as needed for sleep. 30 tablet 1   No current facility-administered medications on file prior to visit.     ROS see history of present illness  Objective  Physical Exam Vitals:   04/30/23 1401  BP: 110/66  Pulse: 80  Temp: 98 F (36.7 C)  SpO2: 97%    BP Readings from Last 3 Encounters:  04/30/23 110/66  04/20/23 110/69  03/19/23 136/74   Wt Readings from Last 3 Encounters:  04/30/23 191 lb 3.2 oz (86.7 kg)  04/20/23 192 lb 14.4 oz (87.5 kg)  03/19/23 193 lb (87.5 kg)    Physical Exam Constitutional:      General: He is not in acute distress.    Appearance: He is not diaphoretic.  Cardiovascular:     Rate and Rhythm: Normal rate and regular rhythm.     Heart sounds: Normal heart sounds.  Pulmonary:     Effort: Pulmonary effort is normal.     Breath sounds: Normal breath sounds.  Skin:    General: Skin is warm and dry.  Neurological:     Mental Status: He is alert.     Comments: 5/5 strength in bilateral biceps, triceps, grip, quads, hamstrings, plantar and dorsiflexion, sensation to light touch intact in bilateral UE and LE, normal finger-nose, normal rapid  alternating movements, positive Romberg      Assessment/Plan: Please see individual problem list.  Other fatigue Assessment & Plan: Chronic issue.  He is having exertional symptoms.  I wonder if this is related to an anginal issue.  Patient will follow-up with his cardiologist as planned on Friday.  He will discuss this with them.  We are going to check vitamin D and thyroid labs today.  CBC checked in the ED revealed stable chronic anemia.  Orders: -     TSH -     T4, free -     T3, free -     VITAMIN D 25 Hydroxy (Vit-D Deficiency, Fractures)  Balance problem Assessment & Plan: This is a chronic ongoing issue.  Suspect related to an issue with proprioception with neuropathy possibly playing a role.  Recent sodium level acceptable.  Offered referral for physical therapy to help  with his balance the patient notes he has been doing balance exercises at home that were given to his wife who has similar issues.  He would prefer to keep doing those.  Advised if this is not improving with those we could refer him.   Allergic rhinitis, unspecified seasonality, unspecified trigger Assessment & Plan: Chronic issue.  Phlegm in his throat is possibly related to allergies.  He can start Claritin over-the-counter and see if that is helpful.  If not beneficial he will let us know.   Coronary artery disease of native artery of native heart with stable angina pectoris [facility]) Assessment & Plan: Chronic issue.  Concerned that his fatigue could be related to this.  Refill nitroglycerin as outlined.  Patient will see cardiology as planned.  Orders: -     Nitroglycerin; DISSOLVE UNDER THE TONGUE 1 TABLET EVERY 5 MINUTES AS NEEDED FOR CHEST PAIN  Dispense: 25 tablet; Refill: 3  Encounter for immunization -     Flu Vaccine Trivalent High Dose (Fluad)     Health Maintenance: Flu vaccine given today.  Patient will get Shingrix vaccine and updated COVID vaccination at the pharmacy.  Return in about 3 months (around 07/30/2023).   Marikay Alar, MD [facility]

## 2023-04-30 NOTE — Patient Instructions (Signed)
Please get the shingles vaccine at the pharmacy.  Please get the updated COVID-vaccine at the pharmacy. Please discuss your fatigue, chest pain, and shortness of breath with your cardiologist when you see them later this week.

## 2023-04-30 NOTE — Assessment & Plan Note (Signed)
This is a chronic ongoing issue.  Suspect related to an issue with proprioception with neuropathy possibly playing a role.  Recent sodium level acceptable.  Offered referral for physical therapy to help with his balance the patient notes he has been doing balance exercises at home that were given to his wife who has similar issues.  He would prefer to keep doing those.  Advised if this is not improving with those we could refer him.

## 2023-04-30 NOTE — Assessment & Plan Note (Signed)
Chronic issue.  Concerned that his fatigue could be related to this.  Refill nitroglycerin as outlined.  Patient will see cardiology as planned.

## 2023-04-30 NOTE — Assessment & Plan Note (Signed)
Chronic issue.  He is having exertional symptoms.  I wonder if this is related to an anginal issue.  Patient will follow-up with his cardiologist as planned on Friday.  He will discuss this with them.  We are going to check vitamin D and thyroid labs today.  CBC checked in the ED revealed stable chronic anemia.

## 2023-04-30 NOTE — Assessment & Plan Note (Signed)
Chronic issue.  Phlegm in his throat is possibly related to allergies.  He can start Claritin over-the-counter and see if that is helpful.  If not beneficial he will let us know.

## 2023-05-01 LAB — TSH: TSH: 5.21 u[IU]/mL (ref 0.35–5.50)

## 2023-05-01 LAB — T3, FREE: T3, Free: 2.5 pg/mL (ref 2.3–4.2)

## 2023-05-01 LAB — T4, FREE: Free T4: 1.15 ng/dL (ref 0.60–1.60)

## 2023-05-01 LAB — VITAMIN D 25 HYDROXY (VIT D DEFICIENCY, FRACTURES): VITD: 29.57 ng/mL — ABNORMAL LOW (ref 30.00–100.00)

## 2023-05-02 ENCOUNTER — Ambulatory Visit: Payer: Medicare HMO | Attending: Cardiology | Admitting: Cardiology

## 2023-05-02 ENCOUNTER — Encounter: Payer: Self-pay | Admitting: Cardiology

## 2023-05-02 VITALS — BP 129/79 | HR 74 | Ht 76.0 in | Wt 188.4 lb

## 2023-05-02 DIAGNOSIS — I480 Paroxysmal atrial fibrillation: Secondary | ICD-10-CM

## 2023-05-02 DIAGNOSIS — R5382 Chronic fatigue, unspecified: Secondary | ICD-10-CM

## 2023-05-02 DIAGNOSIS — R600 Localized edema: Secondary | ICD-10-CM

## 2023-05-02 DIAGNOSIS — I25118 Atherosclerotic heart disease of native coronary artery with other forms of angina pectoris: Secondary | ICD-10-CM

## 2023-05-02 DIAGNOSIS — I951 Orthostatic hypotension: Secondary | ICD-10-CM

## 2023-05-02 DIAGNOSIS — R0600 Dyspnea, unspecified: Secondary | ICD-10-CM | POA: Diagnosis not present

## 2023-05-02 DIAGNOSIS — E785 Hyperlipidemia, unspecified: Secondary | ICD-10-CM

## 2023-05-02 DIAGNOSIS — I10 Essential (primary) hypertension: Secondary | ICD-10-CM

## 2023-05-02 DIAGNOSIS — N1831 Chronic kidney disease, stage 3a: Secondary | ICD-10-CM

## 2023-05-02 NOTE — Progress Notes (Signed)
Cardiology Office Note:  .   Date:  05/02/2023  ID:  [name], DOB March 13, 1930, MRN [id number] PCP: Glori Luis, MD  [facility] Providers Cardiologist:  None    History of Present Illness: .   LADALE RINNER is a 89 y.o. male with past medical history of coronary artery disease with stent placement to the LAD (2005), paroxysmal atrial fibrillation (202 11), PE, orthostatic hypotension and HTN, hyperlipidemia, PVCs, CKD stage IIIa, chronic fatigue, who is here today for follow-up.  Patient underwent stent placement in 2005 to the LAD.  There was noted to be in atrial fibrillation with RVR in the setting of UTI in September 2011.  Cardiac catheterization May/03/2011 showed 90% OM2 disease and he was transferred to [facility] and underwent successful PCI/DES.  Cardiac catheterization in 10/2013 showed small vessel disease and patent stents.  Unfortunately he developed a PE following his catheterization was started on anticoagulation.  Stress test was repeated in 06/2019 that showed no significant ischemia was considered low risk.  Severe asymmetric septal hypertrophy on MRI 1.6 cm without outflow tract obstruction.  Echocardiogram completed 09/2020 revealed EF of 60% with severe asymmetric basal septal hypertrophy.  He was hospitalized last in 08/31/21 with COVID, dropped his oxygen saturations to 83% with ambulation, was given remdesivir, IV steroids, he was positive for orthostatic hypotension.  Was treated and subsequently discharged.  Last heart catheterization in September 2023 revealed patent LAD and OM 3 stents, moderate nonobstructive coronary disease and he was recommended for medical management.  He was last seen in clinic 10/07/2022 by Dr. Mariah Milling.  At that time he complained of shortness of breath with little to no exertion and chest discomfort.  No further ischemic workup was needed he was recommended to take sublingual nitro for chest pain.  No other medication changes were  made.  He was evaluated in the [facility] emergency department on 04/20/2023 with complaints of chest pain.  Patient stated that when he stood up in charge from a seated position he felt substernal chest discomfort that was present but did not worsen with exertion but did resolve after he had taken 2 sublingual nitroglycerin and since that time the pain has not reoccurred.  Patient states that this is similar to previous episodes that he has had in the past.  Initial vital signs reveal blood pressure of 110/69, pulse of 88, respirations of 17, temperature 97.8.  Pertinent labs revealed blood glucose 180, BUN 33, serum creatinine 1.41, estimated GFR 47, hemoglobin 10.5, hematocrit of 32, high-sensitivity troponin negative x 1.  Chest x-ray with no acute cardiopulmonary process.  EKG normal sinus rhythm with a rate 89 with no ST or T wave abnormality noted.  With unrevealing workup patient was discharged home with cardiology follow-up.  He returns to clinic today with continued complaints of fatigue, shortness of breath, and peripheral edema to his bilateral lower extremities.  He states he has had chest pain episodes and is gone to the emergency department on several occasions but all workups have been unrevealing.  Since he has taken his nitro and he has not had any further chest discomfort.  He denies any bleeding and denies blood in his urine or stool and has been compliant with rivaroxaban.  He stated he had just recently had follow-up with his primary care provider and had repeat labs that were done.  ROS: 10 point review of systems has been reviewed and considered negative with exception of what is been listed in  the HPI  Studies Reviewed: Marland Kitchen   EKG Interpretation Date/Time:  Friday May 02 2023 10:16:08 EDT Ventricular Rate:  74 PR Interval:  160 QRS Duration:  90 QT Interval:  398 QTC Calculation: 441 R Axis:   -41  Text Interpretation: Normal sinus rhythm Left axis deviation When compared with  ECG of 20-Apr-2023 11:50, No significant change was found Confirmed by Charlsie Quest ([id number]) on 05/02/2023 10:23:22 AM   LHC 04/29/22  Prox RCA lesion is 40% stenosed.   Mid RCA to Dist RCA lesion is 30% stenosed.   3rd Mrg lesion is 10% stenosed.   Dist LAD lesion is 60% stenosed.   Mid LAD lesion is 20% stenosed.   Mid LAD to Dist LAD lesion is 20% stenosed.   Prox LAD to Mid LAD lesion is 20% stenosed.   1.  Widely patent LAD and OM3 stents with minimal restenosis.  Moderately calcified coronary arteries with mild to moderate nonobstructive coronary artery disease. 2.  Left ventricular angiography was not performed due to chronic kidney disease.  LVEDP was mildly elevated.   Recommendations: Recommend continuing medical therapy. Xarelto can be resumed this evening. The patient's reported episodes of chest pain were in the setting of palpitations.  He is noted to be in ventricular bigeminy during cardiac catheterization.  I added small dose of Toprol to see if he can tolerate.  He had issues with orthostatic hypotension in the past. The patient can likely be discharged home in the afternoon if he remains stable.  Cardiac MRI 11/13/20 IMPRESSION: 1.  Normal left and right ventricular size and function.  LVEF 56%   2. There is severe asymmetric septal hypertrophy of the basal septum measuring 1.6 cm   3.  There is no LVOT obstruction.   4. There is no late gadolinium enhancement or scar in the LV myocardium   Recommendations:   Findings consistent with non obstructive HCM. However, given patient's age and lack of high risk factors (no gradient, no scar, wall thickness <46mm), no additional therapy or management is warranted. Genetic testing (for patient) and screening could be offered to patient's family/children.  TTE 09/28/20  1. Severe asymmetric basal septal hypertrophy. Basal septal wall  measuring upto 1.6cm. no SAM or LVOT obstruction. Recommend CMR to  evaluate for HCM  if clinically appropriate.. Left ventricular ejection  fraction, by estimation, is 60 to 65%. The left  ventricle has normal function. The left ventricle has no regional wall  motion abnormalities. There is severe asymmetric left ventricular  hypertrophy of the basal-septal segment. Left ventricular diastolic  parameters are consistent with Grade I diastolic  dysfunction (impaired relaxation).   2. Right ventricular systolic function is normal. The right ventricular  size is normal.   3. The mitral valve is normal in structure. No evidence of mitral valve  regurgitation.   4. The aortic valve is tricuspid. Aortic valve regurgitation is not  visualized.   5. Aortic dilatation noted. There is mild dilatation of the aortic root,  measuring 43 mm. There is mild dilatation of the ascending aorta,  measuring 38 mm.   6. The inferior vena cava is normal in size with greater than 50%  respiratory variability, suggesting right atrial pressure of 3 mmHg.     Risk Assessment/Calculations:         CHA2DS2-VASc Score = 4   This indicates a 4.8% annual risk of stroke. The patient's score is based upon: CHF History: 0 HTN History: 1 Diabetes History: 0 Stroke  History: 0 Vascular Disease History: 1 Age Score: 2 Gender Score: 0         Physical Exam:   VS:  BP 129/79 (BP Location: Left Arm, Patient Position: Sitting, Cuff Size: Normal)   Pulse 74   Ht 6\' 4"  (1.93 m)   Wt 188 lb 6.4 oz (85.5 kg)   SpO2 98%   BMI 22.93 kg/m    Wt Readings from Last 3 Encounters:  05/02/23 188 lb 6.4 oz (85.5 kg)  04/30/23 191 lb 3.2 oz (86.7 kg)  04/20/23 192 lb 14.4 oz (87.5 kg)    GEN: Well nourished, well developed in no acute distress NECK: No JVD; No carotid bruits CARDIAC: RRR, no murmurs, rubs, gallops RESPIRATORY:  Clear to auscultation without rales, wheezing or rhonchi  ABDOMEN: Soft, non-tender, non-distended EXTREMITIES:  No edema; No deformity   ASSESSMENT AND PLAN: .   Patient  with worsening shortness of breath and peripheral edema.  Last echocardiogram completed in 05/2021 revealed EF 60 to 65% and he was recommended for cardiac MRI to evaluate for hokum.  Cardiac MRI did reveal that he had nonobstructive hokum but given his age and lack of high risk factors (no gradient, no scar, and wall thickness less than 30 mm) there was no additional therapy or management that was warranted.  With his current symptoms he has been scheduled for a repeat echocardiogram.   Coronary artery disease involving the native coronary artery of native heart with stable angina.  He has chronic chest pain dating back several years.  Last heart catheterization was in September 2023 which revealed nonobstructive disease.  He is recommended to continue with his sublingual nitro for his infrequent episodes of chest discomfort.  We did discuss long-acting nitrate such as Imdur but with his history of orthostasis and balance issues we have deferred treatment at this time.  He is continued on rivaroxaban and lieu of aspirin and continued on pravastatin 40 mg daily.  EKG today reveals sinus rhythm with a rate of 74 with a left axis deviation there reveals no significant change from prior studies and no ischemic changes noted today.  Paroxysmal atrial fibrillation where he is currently in sinus rhythm on EKG today.  He is continued on amiodarone 100 mg daily and rivaroxaban 15 mg daily for CHA2DS2-VASc score of at least 4 for stroke prophylaxis.  Mixed hyperlipidemia with cholesterol levels remained at goal on his current medication regimen.  He is continued on pravastatin 40 mg daily.  Previous orthostatic hypotension with a dramatic weight loss in 2020 where he was previously on Florinef and midodrine.  His weight stabilized his blood pressure stabilized.  He denies any dizziness or lightheadedness today.  Blood pressure was 129/79.  And he is continued on lisinopril 10 mg daily.  He has been encouraged to  continue to monitor his blood pressures 1 to 2 hours post medication administration at home as well.  Chronic kidney disease stage III with his last serum creatinine 1.41 and a GFR of 47.  He is encouraged to continue with his hydration, avoid NSAIDs.  And to keep all of his follow-ups with nephrology.        Dispo: Patient to return to see MD/APP once echocardiogram is completed or sooner if needed.  Signed, Seraphim Affinito, NP

## 2023-05-02 NOTE — Patient Instructions (Signed)
Medication Instructions:  Your Physician recommend you continue on your current medication as directed.    *If you need a refill on your cardiac medications before your next appointment, please call your pharmacy*   Lab Work: None ordered today.  If you have labs (blood work) drawn today and your tests are completely normal, you will receive your results only by: MyChart Message (if you have MyChart) OR A paper copy in the mail If you have any lab test that is abnormal or we need to change your treatment, we will call you to review the results.   Testing/Procedures: Your physician has requested that you have an echocardiogram. Echocardiography is a painless test that uses sound waves to create images of your heart. It provides your doctor with information about the size and shape of your heart and how well your heart's chambers and valves are working.   You may receive an ultrasound enhancing agent through an IV if needed to better visualize your heart during the echo. This procedure takes approximately one hour.  There are no restrictions for this procedure.  This will take place at [address] ([facility] [address], [place] [address]    Follow-Up: At [facility], you and your health needs are our priority.  As part of our continuing mission to provide you with exceptional heart care, we have created designated Provider Care Teams.  These Care Teams include your primary Cardiologist (physician) and Advanced Practice Providers (APPs -  Physician Assistants and Nurse Practitioners) who all work together to provide you with the care you need, when you need it.  We recommend signing up for the patient portal called "MyChart".  Sign up information is provided on this After Visit Summary.  MyChart is used to connect with patients for Virtual Visits (Telemedicine).  Patients are able to view lab/test results, encounter notes, upcoming appointments, etc.  Non-urgent  messages can be sent to your provider as well.   To learn more about what you can do with MyChart, go to [email or web address].    Your next appointment:   Follow up after ECHO  Provider:   You may see Dr. Mariah Milling or one of the following Advanced Practice Providers on your designated Care Team:   Nicolasa Ducking, NP Eula Listen, PA-C Cadence Fransico Michael, PA-C Charlsie Quest, NP

## 2023-05-05 ENCOUNTER — Encounter: Payer: Self-pay | Admitting: Family Medicine

## 2023-05-05 DIAGNOSIS — R2689 Other abnormalities of gait and mobility: Secondary | ICD-10-CM

## 2023-05-07 ENCOUNTER — Encounter: Payer: Self-pay | Admitting: Oncology

## 2023-05-07 ENCOUNTER — Inpatient Hospital Stay: Payer: Medicare HMO | Attending: Oncology | Admitting: Oncology

## 2023-05-07 ENCOUNTER — Inpatient Hospital Stay: Payer: Medicare HMO

## 2023-05-07 VITALS — BP 155/84 | HR 70 | Temp 96.9°F | Resp 18 | Wt 188.7 lb

## 2023-05-07 DIAGNOSIS — D649 Anemia, unspecified: Secondary | ICD-10-CM

## 2023-05-07 DIAGNOSIS — Z8546 Personal history of malignant neoplasm of prostate: Secondary | ICD-10-CM

## 2023-05-07 DIAGNOSIS — D5912 Cold autoimmune hemolytic anemia: Secondary | ICD-10-CM | POA: Diagnosis not present

## 2023-05-07 LAB — CBC WITH DIFFERENTIAL (CANCER CENTER ONLY)
Abs Immature Granulocytes: 0.03 10*3/uL (ref 0.00–0.07)
Basophils Absolute: 0 10*3/uL (ref 0.0–0.1)
Basophils Relative: 1 %
Eosinophils Absolute: 0.1 10*3/uL (ref 0.0–0.5)
Eosinophils Relative: 1 %
HCT: 30.1 % — ABNORMAL LOW (ref 39.0–52.0)
Hemoglobin: 10.2 g/dL — ABNORMAL LOW (ref 13.0–17.0)
Immature Granulocytes: 1 %
Lymphocytes Relative: 28 %
Lymphs Abs: 1.4 10*3/uL (ref 0.7–4.0)
MCH: 34 pg (ref 26.0–34.0)
MCHC: 33.9 g/dL (ref 30.0–36.0)
MCV: 100.3 fL — ABNORMAL HIGH (ref 80.0–100.0)
Monocytes Absolute: 0.5 10*3/uL (ref 0.1–1.0)
Monocytes Relative: 10 %
Neutro Abs: 3.1 10*3/uL (ref 1.7–7.7)
Neutrophils Relative %: 59 %
Platelet Count: 260 10*3/uL (ref 150–400)
RBC: 3 MIL/uL — ABNORMAL LOW (ref 4.22–5.81)
RDW: 15.1 % (ref 11.5–15.5)
WBC Count: 5.2 10*3/uL (ref 4.0–10.5)
nRBC: 0 % (ref 0.0–0.2)

## 2023-05-07 LAB — IRON AND TIBC
Iron: 137 ug/dL (ref 45–182)
Saturation Ratios: 29 % (ref 17.9–39.5)
TIBC: 477 ug/dL — ABNORMAL HIGH (ref 250–450)
UIBC: 340 ug/dL

## 2023-05-07 LAB — RETIC PANEL
Immature Retic Fract: 15.3 % (ref 2.3–15.9)
RBC.: 2.73 MIL/uL — ABNORMAL LOW (ref 4.22–5.81)
Retic Count, Absolute: 61.2 10*3/uL (ref 19.0–186.0)
Retic Ct Pct: 2.2 % (ref 0.4–3.1)
Reticulocyte Hemoglobin: 35.4 pg (ref >27.9)

## 2023-05-07 LAB — FERRITIN: Ferritin: 47 ng/mL (ref 24–336)

## 2023-05-07 NOTE — Progress Notes (Signed)
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 Hemoglobin at that time was stable around 10. Patient has been following up with Dr. Birdie Sons for his anemia and his hemoglobin has been more or less stable between 10-11 but at the last couple of values in November and December 2023 had drifted down to 9.5.    Results of anemia of blood work from 08/20/2022 showed normal B12 and iron levels.  Haptoglobin was less than 10.  Myeloma panel showed 0.3 IgM monoclonal protein kappa light chain specificity.  Reticulocyte count was mildly elevated at 3.2%  Interval history-patient had a fallLast month and is slowly recovering from that.  He was also in the ER for an episode of chest pain and had a negative cardiac workup.  ECOG PS- 2 Pain scale- 0   Review of systems- Review of Systems  Constitutional:  Positive for malaise/fatigue. Negative for chills, fever and weight loss.  HENT:  Negative for congestion, ear discharge and nosebleeds.   Eyes:  Negative for blurred vision.  Respiratory:  Negative for cough, hemoptysis, sputum production, shortness of breath and wheezing.   Cardiovascular:  Negative for chest pain, palpitations, orthopnea and claudication.  Gastrointestinal:  Negative for abdominal pain,  blood in stool, constipation, diarrhea, heartburn, melena, nausea and vomiting.  Genitourinary:  Negative for dysuria, flank pain, frequency, hematuria and urgency.  Musculoskeletal:  Negative for back pain, joint pain and myalgias.  Skin:  Negative for rash.  Neurological:  Negative for dizziness, tingling, focal weakness, seizures, weakness and headaches.  Endo/Heme/Allergies:  Does not bruise/bleed easily.  Psychiatric/Behavioral:  Negative for depression and suicidal ideas. The patient does not have insomnia.       Allergies  Allergen Reactions   Bee Venom Swelling   Sulfasalazine Hives and Swelling   Sulfa Antibiotics Hives and Swelling   Warfarin    Warfarin And Related Other (See Comments)    Chest pain     Past Medical History:  Diagnosis Date   Cancer [facility])    Prostate, followed by Dr. Achilles Dunk   Colon polyp    Coronary artery disease    a. 2005 s/p PCI LAD ([facility]); b 2012 s/p PCI OM2; c.10/2013 Cath: LAD 53m ISR, 75m, D1 90, D2 70, OM2 80 w/ patent stent;  d. 06/2019 Low risk MV; e. 07/2020 MV: EF 77%, no ischemia/infarct.   Gall stones    Hyperlipidemia    Hypertension    MI (myocardial infarction) (HCC)    2015   Orthostatic hypotension    a. prev on midodrine/florinef/northera.   PAF (paroxysmal atrial fibrillation) (HCC)    a. on xarelto and amio (CHA2DS2VASc = 4).   Skin cancer      Past Surgical History:  Procedure Laterality Date   CARDIAC CATHETERIZATION  10/17/2013   [facility]   CATARACT EXTRACTION  05/22/2011   right eye  colonoscopy     COLONOSCOPY W/ POLYPECTOMY  08/19/2013   Dr Shelle Iron   COLONOSCOPY WITH PROPOFOL N/A 08/15/2016   Procedure: COLONOSCOPY WITH PROPOFOL;  Surgeon: Wyline Mood, MD;  Location: [facility] ENDOSCOPY;  Service: Endoscopy;  Laterality: N/A;   CORONARY ANGIOPLASTY  08/20/2007   2005; s/p stent   ENTEROSCOPY N/A 09/22/2018   Procedure: ENTEROSCOPY;  Surgeon: Toney Reil, MD;  Location: [facility] ENDOSCOPY;  Service: Gastroenterology;   Laterality: N/A;   ESOPHAGOGASTRODUODENOSCOPY (EGD) WITH PROPOFOL N/A 08/15/2016   Procedure: ESOPHAGOGASTRODUODENOSCOPY (EGD) WITH PROPOFOL;  Surgeon: Wyline Mood, MD;  Location: [facility] ENDOSCOPY;  Service: Endoscopy;  Laterality: N/A;   GIVENS CAPSULE STUDY N/A 09/26/2016   Procedure: GIVENS CAPSULE STUDY;  Surgeon: Wyline Mood, MD;  Location: [facility] ENDOSCOPY;  Service: Endoscopy;  Laterality: N/A;   HERNIA REPAIR  08/20/2011   LEFT HEART CATH AND CORONARY ANGIOGRAPHY N/A 04/29/2022   Procedure: LEFT HEART CATH AND CORONARY ANGIOGRAPHY;  Surgeon: Iran Ouch, MD;  Location: [facility] INVASIVE CV LAB;  Service: Cardiovascular;  Laterality: N/A;   LUMBAR SPINE SURGERY     x2 in [facility] in 1990's   UPPER GI ENDOSCOPY  04/19/2014   Dr Shelle Iron    Social History   Socioeconomic History   Marital status: Married    Spouse name: Not on file   Number of children: Not on file   Years of education: Not on file   Highest education level: 12th grade  Occupational History   Occupation: retired   Tobacco Use   Smoking status: Never   Smokeless tobacco: Never  Vaping Use   Vaping status: Never Used  Substance and Sexual Activity   Alcohol use: No   Drug use: No   Sexual activity: Not Currently  Other Topics Concern   Not on file  Social History Narrative   Not on file   Social Determinants of Health   Financial Resource Strain: Low Risk  (03/01/2023)   Overall Financial Resource Strain (CARDIA)    Difficulty of Paying Living Expenses: Not hard at all  Food Insecurity: No Food Insecurity (03/01/2023)   Hunger Vital Sign    Worried About Running Out of Food in the Last Year: Never true    Ran Out of Food in the Last Year: Never true  Transportation Needs: No Transportation Needs (03/01/2023)   PRAPARE - Administrator, Civil Service (Medical): No    Lack of Transportation (Non-Medical): No  Physical Activity: Unknown (03/01/2023)   Exercise Vital Sign    Days of Exercise per Week: 0  days    Minutes of Exercise per Session: Not on file  Stress: No Stress Concern Present (03/01/2023)   [employer] of Occupational Health - Occupational Stress Questionnaire    Feeling of Stress : Not at all  Social Connections: Unknown (03/01/2023)   Social Connection and Isolation Panel [NHANES]    Frequency of Communication with Friends and Family: Twice a week    Frequency of Social Gatherings with Friends and Family: Three times a week    Attends Religious Services: More than 4 times per year    Active Member of Clubs or Organizations: Not on file    Attends Banker Meetings: Not on file    Marital Status: Married  Intimate Partner Violence: Not At Risk (07/25/2022)   Humiliation, Afraid, Rape, and Kick questionnaire    Fear of Current or Ex-Partner: No    Emotionally Abused: No    Physically Abused: No  Sexually Abused: No    Family History  Problem Relation Age of Onset   Stroke Mother    Colon cancer Father    Coronary artery disease Brother    Other Brother 26       lymes dz     Current Outpatient Medications:    amiodarone (PACERONE) 100 MG tablet, TAKE 1 TABLET BY MOUTH DAILY, Disp: 90 tablet, Rfl: 0   fenofibrate micronized (ANTARA) 130 MG capsule, TAKE 1 CAPSULE BY MOUTH DAILY  BEFORE BREAKFAST, Disp: 90 capsule, Rfl: 3   finasteride (PROSCAR) 5 MG tablet, TAKE 1 TABLET BY MOUTH DAILY, Disp: 90 tablet, Rfl: 3   levobunolol (BETAGAN) 0.5 % ophthalmic solution, Place 1 drop into the right eye 2 (two) times daily., Disp: , Rfl:    lisinopril (ZESTRIL) 10 MG tablet, TAKE 1 TABLET BY MOUTH DAILY, Disp: 90 tablet, Rfl: 0   loratadine (CLARITIN) 10 MG tablet, Take 1 tablet (10 mg total) by mouth daily., Disp: 30 tablet, Rfl: 1   Multiple Vitamin (MULTIVITAMIN PO), Take 1 tablet by mouth daily., Disp: , Rfl:    nitroGLYCERIN (NITROSTAT) 0.4 MG SL tablet, DISSOLVE UNDER THE TONGUE 1 TABLET EVERY 5 MINUTES AS NEEDED FOR CHEST PAIN, Disp: 25 tablet, Rfl: 3    pantoprazole (PROTONIX) 40 MG tablet, TAKE ONE (1) TABLET BY MOUTH TWO TIMES PER DAY, Disp: 90 tablet, Rfl: 3   pravastatin (PRAVACHOL) 40 MG tablet, TAKE 1 TABLET BY MOUTH AT  BEDTIME, Disp: 90 tablet, Rfl: 3   XARELTO 15 MG TABS tablet, TAKE 1 TABLET BY MOUTH DAILY, Disp: 90 tablet, Rfl: 3   zolpidem (AMBIEN) 5 MG tablet, Take 1 tablet (5 mg total) by mouth at bedtime as needed for sleep., Disp: 30 tablet, Rfl: 1  Physical exam:  Vitals:   05/07/23 1024  BP: (!) 155/84  Pulse: 70  Resp: 18  Temp: (!) 96.9 F (36.1 C)  TempSrc: Tympanic  SpO2: 100%  Weight: 188 lb 11.2 oz (85.6 kg)   Physical Exam Cardiovascular:     Rate and Rhythm: Normal rate and regular rhythm.     Heart sounds: Normal heart sounds.  Pulmonary:     Effort: Pulmonary effort is normal.     Breath sounds: Normal breath sounds.  Skin:    General: Skin is warm and dry.  Neurological:     Mental Status: He is alert and oriented to person, place, and time.         Latest Ref Rng & Units 04/20/2023   11:51 AM  CMP  Glucose 70 - 99 mg/dL 161   BUN 8 - 23 mg/dL 33   Creatinine 0.96 - 1.24 mg/dL 0.45   Sodium 409 - 811 mmol/L 136   Potassium 3.5 - 5.1 mmol/L 4.4   Chloride 98 - 111 mmol/L 103   CO2 22 - 32 mmol/L 22   Calcium 8.9 - 10.3 mg/dL 9.4       Latest Ref Rng & Units 05/07/2023    9:36 AM  CBC  WBC 4.0 - 10.5 K/uL 5.2   Hemoglobin 13.0 - 17.0 g/dL 91.4   Hematocrit 78.2 - 52.0 % 30.1   Platelets 150 - 400 K/uL 260     No images are attached to the encounter.  DG Chest 2 View  Result Date: 04/20/2023 CLINICAL DATA:  Chest pain that started today. EXAM: CHEST - 2 VIEW COMPARISON:  Radiographs 07/24/2022 and 04/27/2022.  CT 07/24/2022. FINDINGS: The heart size and mediastinal contours  are stable. There are aortic and coronary artery calcifications as well as a probable coronary artery stent. The lungs are clear. There is no pleural effusion or pneumothorax. The bones appear unchanged. There are  glenohumeral degenerative changes bilaterally without evidence of a chronic rotator cuff tear on the left. Mild thoracic spine degenerative changes. IMPRESSION: No evidence of acute cardiopulmonary process. Stable chronic findings. Electronically Signed   By: Carey Bullocks M.D.   On: 04/20/2023 12:31     Assessment and plan- Patient is a 89 y.o. male with history of cold agglutinin hemolytic anemia here for routine follow-up  Patient's hemoglobin has remained stable around 10.He continues to have undetectable haptoglobin likely from low level cold agglutinin hemolytic anemia but has not required any treatment so far.  Iron studies today are within normal limits.  Given the stability of his hemoglobin I will repeat his labs in 6 months in 1 year and see him back in 1 year   Visit Diagnosis 1. Cold agglutinin disease (HCC)   2. History of prostate cancer   3. Normocytic anemia      Dr. Owens Shark, MD, MPH [facility] [phone] 05/07/2023 1:26 PM

## 2023-05-08 LAB — HAPTOGLOBIN: Haptoglobin: <10 mg/dL — ABNORMAL LOW (ref 38–329)

## 2023-05-14 ENCOUNTER — Ambulatory Visit: Payer: Medicare HMO | Attending: Family Medicine

## 2023-05-14 DIAGNOSIS — Z9181 History of falling: Secondary | ICD-10-CM | POA: Insufficient documentation

## 2023-05-14 DIAGNOSIS — R262 Difficulty in walking, not elsewhere classified: Secondary | ICD-10-CM | POA: Insufficient documentation

## 2023-05-14 DIAGNOSIS — R2689 Other abnormalities of gait and mobility: Secondary | ICD-10-CM | POA: Diagnosis not present

## 2023-05-14 DIAGNOSIS — R2681 Unsteadiness on feet: Secondary | ICD-10-CM | POA: Insufficient documentation

## 2023-05-14 NOTE — Therapy (Signed)
OUTPATIENT PHYSICAL THERAPY  EVALUATION   Patient Name: William Taylor MRN: [id number] DOB:10-May-1930, 89 y.o., male Today's Date: 05/14/2023  END OF SESSION:  PT End of Session - 05/14/23 0906     Visit Number 1    Number of Visits 17    Date for PT Re-Evaluation 07/11/23    PT Start Time 0906    PT Stop Time 0946    PT Time Calculation (min) 40 min    Equipment Utilized During Treatment Gait belt;Other (comment)   SPC   Activity Tolerance Patient tolerated treatment well    Behavior During Therapy WFL for tasks assessed/performed             Past Medical History:  Diagnosis Date   Cancer [facility])    Prostate, followed by Dr. Achilles Dunk   Colon polyp    Coronary artery disease    a. 2005 s/p PCI LAD ([facility]); b 2012 s/p PCI OM2; c.10/2013 Cath: LAD 54m ISR, 87m, D1 90, D2 70, OM2 80 w/ patent stent;  d. 06/2019 Low risk MV; e. 07/2020 MV: EF 77%, no ischemia/infarct.   Gall stones    Hyperlipidemia    Hypertension    MI (myocardial infarction) (HCC)    2015   Orthostatic hypotension    a. prev on midodrine/florinef/northera.   PAF (paroxysmal atrial fibrillation) (HCC)    a. on xarelto and amio (CHA2DS2VASc = 4).   Skin cancer    Past Surgical History:  Procedure Laterality Date   CARDIAC CATHETERIZATION  10/17/2013   [facility]   CATARACT EXTRACTION  05/22/2011   right eye   colonoscopy     COLONOSCOPY W/ POLYPECTOMY  08/19/2013   Dr Shelle Iron   COLONOSCOPY WITH PROPOFOL N/A 08/15/2016   Procedure: COLONOSCOPY WITH PROPOFOL;  Surgeon: Wyline Mood, MD;  Location: [facility] ENDOSCOPY;  Service: Endoscopy;  Laterality: N/A;   CORONARY ANGIOPLASTY  08/20/2007   2005; s/p stent   ENTEROSCOPY N/A 09/22/2018   Procedure: ENTEROSCOPY;  Surgeon: Toney Reil, MD;  Location: [facility] ENDOSCOPY;  Service: Gastroenterology;  Laterality: N/A;   ESOPHAGOGASTRODUODENOSCOPY (EGD) WITH PROPOFOL N/A 08/15/2016   Procedure: ESOPHAGOGASTRODUODENOSCOPY (EGD) WITH PROPOFOL;  Surgeon: Wyline Mood, MD;   Location: [facility] ENDOSCOPY;  Service: Endoscopy;  Laterality: N/A;   GIVENS CAPSULE STUDY N/A 09/26/2016   Procedure: GIVENS CAPSULE STUDY;  Surgeon: Wyline Mood, MD;  Location: [facility] ENDOSCOPY;  Service: Endoscopy;  Laterality: N/A;   HERNIA REPAIR  08/20/2011   LEFT HEART CATH AND CORONARY ANGIOGRAPHY N/A 04/29/2022   Procedure: LEFT HEART CATH AND CORONARY ANGIOGRAPHY;  Surgeon: Iran Ouch, MD;  Location: [facility] INVASIVE CV LAB;  Service: Cardiovascular;  Laterality: N/A;   LUMBAR SPINE SURGERY     x2 in [place] in 1990's   UPPER GI ENDOSCOPY  04/19/2014   Dr Shelle Iron   Patient Active Problem List   Diagnosis Date Noted   Balance problem 04/30/2023   Compression fracture of T2 vertebra (HCC) 03/05/2023   Cerebral infarction, chronic 03/05/2023   Thyroid nodule 03/05/2023   Chronic constipation 08/06/2022   PVC's (premature ventricular contractions) 05/07/2022   Neuropathic pain 04/04/2022   Spondylolisthesis, lumbar region 03/14/2022   Hypophosphatemia 09/02/2021   Allergic rhinitis 08/30/2020   Coronary artery disease    GERD (gastroesophageal reflux disease)    Benign prostatic hyperplasia with lower urinary tract symptoms 08/13/2020   Fall 11/11/2019   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   Osteoarthritis of knees, bilateral 08/31/2019   History of GI bleed  Orthostatic hypotension 06/06/2018   Chronic pain of left knee 06/05/2018   Chronic back pain 02/17/2018   Angiodysplasia of intestinal tract    Iron deficiency anemia    Benign neoplasm of ascending colon    Diverticulosis of large intestine without diverticulitis    Gastric polyp    Left rotator cuff tear arthropathy 09/11/2015   Fatigue 04/15/2014   Prostate cancer (HCC) 03/15/2013   DOE (dyspnea on exertion) 11/23/2012   Insomnia 09/03/2012   Hypertension 07/23/2012   CKD (chronic kidney disease), stage IIIa 07/23/2011   History of pulmonary embolism 01/09/2011   Paroxysmal atrial fibrillation (HCC)  09/06/2010   Mixed hyperlipidemia 05/19/2009    PCP: Glori Luis, MD  REFERRING PROVIDER: Glori Luis, MD  REFERRING DIAG: R26.89 (ICD-10-CM) - Balance problem  Rationale for Evaluation and Treatment: Rehabilitation  THERAPY DIAG:  Unsteadiness on feet - Plan: PT plan of care cert/re-cert  Difficulty in walking, not elsewhere classified - Plan: PT plan of care cert/re-cert  History of falling - Plan: PT plan of care cert/re-cert  ONSET DATE: 2-3 months ago  SUBJECTIVE:                                                                                                                                                                                           SUBJECTIVE STATEMENT: No pain currently.   PERTINENT HISTORY:  Balance. Has been using a SPC for about 8-9 months and a rw at night for about 5-6 months. Has had difficulty with his balance about 2-3 months ago and has not improved. Had 2 back operations years ago. Took a bad fall many years ago and screwed up both knees. Went to the ER about month ago and had chest and upper back pain with sweating. Has happened several times before. Has had 2 stents and one heart attack. Can have CPR performed if needed.   Feels like losing his balance when he turns while talking or doing something.   No latex allergies  Blood pressure is pretty much controlled per pt.     PAIN:  Are you having pain? No  PRECAUTIONS: Fall, Has nitroglycerin for his heart  RED FLAGS: Bowel or bladder incontinence: No and Cauda equina syndrome: No   WEIGHT BEARING RESTRICTIONS: No  FALLS:  Has patient fallen in last 6 months? Yes. Number of falls 1  At night. Uses a walker to go to the bathroom and his R foot got caught at the edge of the walker.   LIVING ENVIRONMENT: Lives with: lives with their spouse Lives in: House/apartment Stairs: No Has following equipment at home: Single point  cane, Walker - 2 wheeled, and Grab  bars  OCCUPATION: retired  PLOF: Independent with household mobility with device  PATIENT GOALS: Walk better.   NEXT MD VISIT: none  OBJECTIVE:   DIAGNOSTIC FINDINGS:  CT Head Wo Contrast 02/26/2023   CLINICAL DATA:  Head trauma, moderate-severe; Neck trauma (Age >= 65y)   EXAM: CT HEAD WITHOUT CONTRAST   CT CERVICAL SPINE WITHOUT CONTRAST   TECHNIQUE: Multidetector CT imaging of the head and cervical spine was performed following the standard protocol without intravenous contrast. Multiplanar CT image reconstructions of the cervical spine were also generated.   RADIATION DOSE REDUCTION: This exam was performed according to the departmental dose-optimization program which includes automated exposure control, adjustment of the mA and/or kV according to patient size and/or use of iterative reconstruction technique.   COMPARISON:  CT Chest 07/24/22   FINDINGS: CT HEAD FINDINGS   Brain: No evidence of acute infarction, hemorrhage, hydrocephalus, extra-axial collection or mass lesion/mass effect. Most likely a chronic, but technically age indeterminate infarct in the right cerebellum. There is a chronic left parietal lobe infarct. Sequela of moderate chronic microvascular ischemic change.   Vascular: No hyperdense vessel or unexpected calcification.   Skull: Soft tissue swelling along the right frontal scalp. No evidence of an underlying calvarial fracture.   Sinuses/Orbits: No middle ear or mastoid effusion. Paranasal sinuses are clear. Right lens replacement. Orbits are otherwise unremarkable.   Other: None.   CT CERVICAL SPINE FINDINGS   Alignment: Trace anterolisthesis of C4 on C5.   Skull base and vertebrae: No acute cervical spine fracture. No primary bone lesion or focal pathologic process. There is an acute appearing superior endplate compression deformity at T2   Soft tissues and spinal canal: No prevertebral fluid or swelling. No visible canal  hematoma.   Disc levels:  No evidence of high-grade spinal canal stenosis.   Upper chest: Negative   Other: There is a 1.7 cm left thyroid nodule (series 4, image 94). Recommend further evaluation with dedicated thyroid ultrasound, if not previously performed.   IMPRESSION: 1. No acute intracranial abnormality. 2. Soft tissue swelling along the right frontal scalp. No evidence of an underlying calvarial fracture. 3. No acute cervical spine fracture. 4. Acute superior endplate compression deformity at T2. 5. There is a 1.7 cm left thyroid nodule. Recommend further evaluation with dedicated thyroid ultrasound, if not previously performed.     Electronically Signed   By: Lorenza Cambridge M.D.   On: 02/26/2023 09:53  PATIENT SURVEYS:    SCREENING FOR RED FLAGS: Bowel or bladder incontinence: No  Cauda equina syndrome: No   COGNITION: Overall cognitive status: Within functional limits for tasks assessed     SENSATION:     POSTURE:   PALPATION:   LUMBAR ROM:   AROM eval  Flexion   Extension   Right lateral flexion   Left lateral flexion   Right rotation   Left rotation    (Blank rows = not tested)  LOWER EXTREMITY ROM:     Active  Right eval Left eval  Hip flexion    Hip extension    Hip abduction    Hip adduction    Hip internal rotation    Hip external rotation    Knee flexion    Knee extension    Ankle dorsiflexion    Ankle plantarflexion    Ankle inversion    Ankle eversion     (Blank rows = not tested)   Very limited R and  L cervical rotation AROM as well as very limited cervical extension AROM observed    LOWER EXTREMITY MMT:    MMT Right eval Left eval  Hip flexion    Hip extension    Hip abduction    Hip adduction    Hip internal rotation    Hip external rotation    Knee flexion    Knee extension    Ankle dorsiflexion    Ankle plantarflexion    Ankle inversion    Ankle eversion     (Blank rows = not tested)  LUMBAR  SPECIAL TESTS:    FUNCTIONAL TESTS:  5 times sit to stand: 21 seconds with B UE assist Timed up and go (TUG): 14.2 seconds without AD  Dynamic Gait Index: 15 with SPC on R side   TUG with SPC 17.21 seconds (first try)   Without SPC 15.61, 13.05, 13.96  seconds (14.2 seconds average without AD)   Observation: stand to sit, pt not in front of chair prior to sitting down, almost missed the chair when turning to sit after gait.   GAIT: Distance walked: 40 ft Assistive device utilized: Single point cane Level of assistance: CGA Comments: decreased stance R LE, SPC in R side      TODAY'S TREATMENT:                                                                                                                              DATE: 05/14/2023   Blood pressure L arm sitting, mechanically taken, normal cuff: 137/82, HR 68  PATIENT EDUCATION:  Education details: POC Person educated: Patient Education method: Explanation Education comprehension: verbalized understanding  HOME EXERCISE PROGRAM:   ASSESSMENT:  CLINICAL IMPRESSION: Patient is a 89 y.o. male who was seen today for physical therapy evaluation and treatment for decreased balance. He also presents with decreased functional LE strength, altered gait pattern, decreased functional mobility and difficulty with dynamic gait balance. Pt will benefit from skilled physical therapy services to address the aforementioned deficits.       OBJECTIVE IMPAIRMENTS: Abnormal gait, decreased activity tolerance, decreased balance, difficulty walking, decreased strength, improper body mechanics, and postural dysfunction.   ACTIVITY LIMITATIONS: carrying, lifting, standing, stairs, transfers, and locomotion level  PARTICIPATION LIMITATIONS:   PERSONAL FACTORS: Age, Fitness, Past/current experiences, and 3+ comorbidities: CA, CAD, HTN, MI, orthostaic hypotension, PAF  are also affecting patient's functional outcome.   REHAB POTENTIAL: Fair     CLINICAL DECISION MAKING: Stable/uncomplicated  EVALUATION COMPLEXITY: Low   GOALS: Goals reviewed with patient? Yes  SHORT TERM GOALS: Target date: 06/06/2023   Pt will be independent with his initial HEP to improve strength, balance, and function.  Baseline: Pt has not yet started his initial HEP (05/14/2023) Goal status: INITIAL  LONG TERM GOALS: Target date: 07/11/2023  Pt will improve his 5 times sit to stand time to 12 seconds or less without UE assist as a demonstration of improved functional LE strength.  Baseline: 21 seconds with B UE assist (05/14/2023) Goal status: INITIAL  2.  Pt will improve his TUG time to 12 seconds or less without AD as a demonstration of improved functional mobility and balance. Baseline: Timed up and go (TUG): 14.2 seconds without AD (05/14/2023) Goal status: INITIAL  3.  Pt will improve his Dynamic Gait Index score to 19 or more without AD as a demonstration of improved balance.  Baseline: Dynamic Gait Index: 15 with SPC on R side (05/14/2023) Goal status: INITIAL  PLAN:  PT FREQUENCY: 2x/week  PT DURATION: 8 weeks  PLANNED INTERVENTIONS: Therapeutic exercises, Therapeutic activity, Neuromuscular re-education, Balance training, Gait training, Patient/Family education, Stair training, Vestibular training, Canalith repositioning, Dry Needling, Electrical stimulation, Manual therapy, and Re-evaluation.  PLAN FOR NEXT SESSION: Functional LE, glute med, max, quadriceps strengthening, balance, manual techniques, modalities PRN.   [id number] PT, DPT  05/14/2023, 12:20 PM

## 2023-05-19 ENCOUNTER — Ambulatory Visit: Payer: Medicare HMO

## 2023-05-19 DIAGNOSIS — Z9181 History of falling: Secondary | ICD-10-CM

## 2023-05-19 DIAGNOSIS — R262 Difficulty in walking, not elsewhere classified: Secondary | ICD-10-CM

## 2023-05-19 DIAGNOSIS — R2681 Unsteadiness on feet: Secondary | ICD-10-CM | POA: Diagnosis not present

## 2023-05-19 NOTE — Therapy (Signed)
OUTPATIENT PHYSICAL THERAPY  Treatment   Patient Name: William Taylor MRN: [id number] DOB:Oct 09, 1929, 89 y.o., male Today's Date: 05/19/2023  END OF SESSION:  PT End of Session - 05/19/23 1438     Visit Number 2    Number of Visits 17    Date for PT Re-Evaluation 07/11/23    PT Start Time 1438   Pt arrived late   PT Stop Time 1514    PT Time Calculation (min) 36 min    Equipment Utilized During Treatment Gait belt;Other (comment)   SPC   Activity Tolerance Patient tolerated treatment well    Behavior During Therapy WFL for tasks assessed/performed              Past Medical History:  Diagnosis Date   Cancer [facility])    Prostate, followed by Dr. Achilles Dunk   Colon polyp    Coronary artery disease    a. 2005 s/p PCI LAD ([facility]); b 2012 s/p PCI OM2; c.10/2013 Cath: LAD 28m ISR, 67m, D1 90, D2 70, OM2 80 w/ patent stent;  d. 06/2019 Low risk MV; e. 07/2020 MV: EF 77%, no ischemia/infarct.   Gall stones    Hyperlipidemia    Hypertension    MI (myocardial infarction) (HCC)    2015   Orthostatic hypotension    a. prev on midodrine/florinef/northera.   PAF (paroxysmal atrial fibrillation) (HCC)    a. on xarelto and amio (CHA2DS2VASc = 4).   Skin cancer    Past Surgical History:  Procedure Laterality Date   CARDIAC CATHETERIZATION  10/17/2013   [facility]   CATARACT EXTRACTION  05/22/2011   right eye   colonoscopy     COLONOSCOPY W/ POLYPECTOMY  08/19/2013   Dr Shelle Iron   COLONOSCOPY WITH PROPOFOL N/A 08/15/2016   Procedure: COLONOSCOPY WITH PROPOFOL;  Surgeon: Wyline Mood, MD;  Location: [facility] ENDOSCOPY;  Service: Endoscopy;  Laterality: N/A;   CORONARY ANGIOPLASTY  08/20/2007   2005; s/p stent   ENTEROSCOPY N/A 09/22/2018   Procedure: ENTEROSCOPY;  Surgeon: Toney Reil, MD;  Location[facility] ENDOSCOPY;  Service: Gastroenterology;  Laterality: N/A;   ESOPHAGOGASTRODUODENOSCOPY (EGD) WITH PROPOFOL N/A 08/15/2016   Procedure: ESOPHAGOGASTRODUODENOSCOPY (EGD) WITH PROPOFOL;   Surgeon: Wyline Mood, MD;  Location: [facility] ENDOSCOPY;  Service: Endoscopy;  Laterality: N/A;   GIVENS CAPSULE STUDY N/A 09/26/2016   Procedure: GIVENS CAPSULE STUDY;  Surgeon: Wyline Mood, MD;  Location: [facility] ENDOSCOPY;  Service: Endoscopy;  Laterality: N/A;   HERNIA REPAIR  08/20/2011   LEFT HEART CATH AND CORONARY ANGIOGRAPHY N/A 04/29/2022   Procedure: LEFT HEART CATH AND CORONARY ANGIOGRAPHY;  Surgeon: Iran Ouch, MD;  Location: [facility] INVASIVE CV LAB;  Service: Cardiovascular;  Laterality: N/A;   LUMBAR SPINE SURGERY     x2 in [place] in 1990's   UPPER GI ENDOSCOPY  04/19/2014   Dr Shelle Iron   Patient Active Problem List   Diagnosis Date Noted   Balance problem 04/30/2023   Compression fracture of T2 vertebra (HCC) 03/05/2023   Cerebral infarction, chronic 03/05/2023   Thyroid nodule 03/05/2023   Chronic constipation 08/06/2022   PVC's (premature ventricular contractions) 05/07/2022   Neuropathic pain 04/04/2022   Spondylolisthesis, lumbar region 03/14/2022   Hypophosphatemia 09/02/2021   Allergic rhinitis 08/30/2020   Coronary artery disease    GERD (gastroesophageal reflux disease)    Benign prostatic hyperplasia with lower urinary tract symptoms 08/13/2020   Fall 11/11/2019   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   Osteoarthritis of knees, bilateral 08/31/2019  History of GI bleed    Orthostatic hypotension 06/06/2018   Chronic pain of left knee 06/05/2018   Chronic back pain 02/17/2018   Angiodysplasia of intestinal tract    Iron deficiency anemia    Benign neoplasm of ascending colon    Diverticulosis of large intestine without diverticulitis    Gastric polyp    Left rotator cuff tear arthropathy 09/11/2015   Fatigue 04/15/2014   Prostate cancer (HCC) 03/15/2013   DOE (dyspnea on exertion) 11/23/2012   Insomnia 09/03/2012   Hypertension 07/23/2012   CKD (chronic kidney disease), stage IIIa 07/23/2011   History of pulmonary embolism 01/09/2011   Paroxysmal atrial  fibrillation (HCC) 09/06/2010   Mixed hyperlipidemia 05/19/2009    PCP: Glori Luis, MD  REFERRING PROVIDER: Glori Luis, MD  REFERRING DIAG: R26.89 (ICD-10-CM) - Balance problem  Rationale for Evaluation and Treatment: Rehabilitation  THERAPY DIAG:  Unsteadiness on feet  Difficulty in walking, not elsewhere classified  History of falling  ONSET DATE: 2-3 months ago  SUBJECTIVE:                                                                                                                                                                                           SUBJECTIVE STATEMENT: Got an adjustable SPC at highest setting. Was told that he might have neuropathy at the back of his legs.    PERTINENT HISTORY:  Balance. Has been using a SPC for about 8-9 months and a rw at night for about 5-6 months. Has had difficulty with his balance about 2-3 months ago and has not improved. Had 2 back operations years ago. Took a bad fall many years ago and screwed up both knees. Went to the ER about month ago and had chest and upper back pain with sweating. Has happened several times before. Has had 2 stents and one heart attack. Can have CPR performed if needed.   Feels like losing his balance when he turns while talking or doing something.   No latex allergies  Blood pressure is pretty much controlled per pt.     PAIN:  Are you having pain? No  PRECAUTIONS: Fall, Has nitroglycerin for his heart  RED FLAGS: Bowel or bladder incontinence: No and Cauda equina syndrome: No   WEIGHT BEARING RESTRICTIONS: No  FALLS:  Has patient fallen in last 6 months? Yes. Number of falls 1  At night. Uses a walker to go to the bathroom and his R foot got caught at the edge of the walker.   LIVING ENVIRONMENT: Lives with: lives with their spouse Lives in: House/apartment Stairs: No Has following equipment  at home: Single point cane, Walker - 2 wheeled, and Grab bars  OCCUPATION:  retired  PLOF: Independent with household mobility with device  PATIENT GOALS: Walk better.   NEXT MD VISIT: none  OBJECTIVE:   DIAGNOSTIC FINDINGS:  CT Head Wo Contrast 02/26/2023   CLINICAL DATA:  Head trauma, moderate-severe; Neck trauma (Age >= 65y)   EXAM: CT HEAD WITHOUT CONTRAST   CT CERVICAL SPINE WITHOUT CONTRAST   TECHNIQUE: Multidetector CT imaging of the head and cervical spine was performed following the standard protocol without intravenous contrast. Multiplanar CT image reconstructions of the cervical spine were also generated.   RADIATION DOSE REDUCTION: This exam was performed according to the departmental dose-optimization program which includes automated exposure control, adjustment of the mA and/or kV according to patient size and/or use of iterative reconstruction technique.   COMPARISON:  CT Chest 07/24/22   FINDINGS: CT HEAD FINDINGS   Brain: No evidence of acute infarction, hemorrhage, hydrocephalus, extra-axial collection or mass lesion/mass effect. Most likely a chronic, but technically age indeterminate infarct in the right cerebellum. There is a chronic left parietal lobe infarct. Sequela of moderate chronic microvascular ischemic change.   Vascular: No hyperdense vessel or unexpected calcification.   Skull: Soft tissue swelling along the right frontal scalp. No evidence of an underlying calvarial fracture.   Sinuses/Orbits: No middle ear or mastoid effusion. Paranasal sinuses are clear. Right lens replacement. Orbits are otherwise unremarkable.   Other: None.   CT CERVICAL SPINE FINDINGS   Alignment: Trace anterolisthesis of C4 on C5.   Skull base and vertebrae: No acute cervical spine fracture. No primary bone lesion or focal pathologic process. There is an acute appearing superior endplate compression deformity at T2   Soft tissues and spinal canal: No prevertebral fluid or swelling. No visible canal hematoma.   Disc  levels:  No evidence of high-grade spinal canal stenosis.   Upper chest: Negative   Other: There is a 1.7 cm left thyroid nodule (series 4, image 94). Recommend further evaluation with dedicated thyroid ultrasound, if not previously performed.   IMPRESSION: 1. No acute intracranial abnormality. 2. Soft tissue swelling along the right frontal scalp. No evidence of an underlying calvarial fracture. 3. No acute cervical spine fracture. 4. Acute superior endplate compression deformity at T2. 5. There is a 1.7 cm left thyroid nodule. Recommend further evaluation with dedicated thyroid ultrasound, if not previously performed.     Electronically Signed   By: Lorenza Cambridge M.D.   On: 02/26/2023 09:53  PATIENT SURVEYS:    SCREENING FOR RED FLAGS: Bowel or bladder incontinence: No  Cauda equina syndrome: No   COGNITION: Overall cognitive status: Within functional limits for tasks assessed     SENSATION:     POSTURE:   PALPATION:   Cervical AROM:   AROM 05/19/2023  Flexion   Extension   Right lateral flexion   Left lateral flexion   Right rotation 40  Left rotation 35   (Blank rows = not tested)  LOWER EXTREMITY ROM:     Active  Right 05/19/2023 Left 05/19/2023  Hip flexion    Hip extension (seated manually resisted) 4 4-  Hip abduction (seated manually resisted, hips less than 90 degrees flexion) 4+ 4+  Hip adduction    Hip internal rotation    Hip external rotation    Knee flexion    Knee extension    Ankle dorsiflexion    Ankle plantarflexion    Ankle inversion    Ankle  eversion     (Blank rows = not tested)   Very limited R and L cervical rotation AROM as well as very limited cervical extension AROM observed    LOWER EXTREMITY MMT:    MMT Right eval Left eval  Hip flexion    Hip extension    Hip abduction    Hip adduction    Hip internal rotation    Hip external rotation    Knee flexion    Knee extension    Ankle dorsiflexion     Ankle plantarflexion    Ankle inversion    Ankle eversion     (Blank rows = not tested)  LUMBAR SPECIAL TESTS:    FUNCTIONAL TESTS:  5 times sit to stand: 21 seconds with B UE assist Timed up and go (TUG): 14.2 seconds without AD  Dynamic Gait Index: 15 with SPC on R side   TUG with SPC 17.21 seconds (first try)   Without SPC 15.61, 13.05, 13.96  seconds (14.2 seconds average without AD)   Observation: stand to sit, pt not in front of chair prior to sitting down, almost missed the chair when turning to sit after gait.   GAIT: Distance walked: 40 ft Assistive device utilized: Single point cane Level of assistance: CGA Comments: decreased stance R LE, SPC in R side      TODAY'S TREATMENT:                                                                                                                              DATE: 05/19/2023    Therapeutic exercise  Seated manually resisted hip extension, hip abduction 1x each way for each LE  Standing static mini lunge with contralateral UE assist   L 10x2  R 10x2 (with B UE assist)  Seated B scapular retraction to promote upper thoracic extension 10x5 seconds for 3 sets  Standing forward weight shift onto forefeet without UE assist (UE assist PRN)   10x5 seconds for 2 sets  To promote ankle strategy  Standing 360 degree turns without UE assist (pt just holding his SPC)  To the R 2x  To the L 2x   Improved exercise technique, movement at target joints, use of target muscles after mod verbal, visual, tactile cues.      PATIENT EDUCATION:  Education details: POC Person educated: Patient Education method: Explanation Education comprehension: verbalized understanding  HOME EXERCISE PROGRAM: Access Code: [email or web address] URL: [email or web address] Date: 05/19/2023 Prepared by: Loralyn Freshwater  Exercises - Seated Scapular Retraction  - 2 x daily - 7 x weekly - 3 sets - 10 reps - 5 seconds  hold  ASSESSMENT:  CLINICAL IMPRESSION: Pt arrived late so session was adjusted accordingly. Worked on glute strengthening, thoracic extension (to promote more upright posture via scapular retractions) as well as ankle strengthening/ankle strategy to promote balance. Pt tolerated session well without aggravation of symptoms. Pt will benefit from continued skilled physical therapy services to improve  strength, balance, and function.         OBJECTIVE IMPAIRMENTS: Abnormal gait, decreased activity tolerance, decreased balance, difficulty walking, decreased strength, improper body mechanics, and postural dysfunction.   ACTIVITY LIMITATIONS: carrying, lifting, standing, stairs, transfers, and locomotion level  PARTICIPATION LIMITATIONS:   PERSONAL FACTORS: Age, Fitness, Past/current experiences, and 3+ comorbidities: CA, CAD, HTN, MI, orthostaic hypotension, PAF  are also affecting patient's functional outcome.   REHAB POTENTIAL: Fair    CLINICAL DECISION MAKING: Stable/uncomplicated  EVALUATION COMPLEXITY: Low   GOALS: Goals reviewed with patient? Yes  SHORT TERM GOALS: Target date: 06/06/2023   Pt will be independent with his initial HEP to improve strength, balance, and function.  Baseline: Pt has not yet started his initial HEP (05/14/2023) Goal status: INITIAL  LONG TERM GOALS: Target date: 07/11/2023  Pt will improve his 5 times sit to stand time to 12 seconds or less without UE assist as a demonstration of improved functional LE strength.  Baseline: 21 seconds with B UE assist (05/14/2023) Goal status: INITIAL  2.  Pt will improve his TUG time to 12 seconds or less without AD as a demonstration of improved functional mobility and balance. Baseline: Timed up and go (TUG): 14.2 seconds without AD (05/14/2023) Goal status: INITIAL  3.  Pt will improve his Dynamic Gait Index score to 19 or more without AD as a demonstration of improved balance.  Baseline: Dynamic Gait  Index: 15 with SPC on R side (05/14/2023) Goal status: INITIAL  PLAN:  PT FREQUENCY: 2x/week  PT DURATION: 8 weeks  PLANNED INTERVENTIONS: Therapeutic exercises, Therapeutic activity, Neuromuscular re-education, Balance training, Gait training, Patient/Family education, Stair training, Vestibular training, Canalith repositioning, Dry Needling, Electrical stimulation, Manual therapy, and Re-evaluation.  PLAN FOR NEXT SESSION: Functional LE, glute med, max, quadriceps strengthening, balance, manual techniques, modalities PRN.   [address] PT, DPT  05/19/2023, 4:40 PM

## 2023-05-22 ENCOUNTER — Ambulatory Visit: Payer: Medicare HMO | Attending: Family Medicine

## 2023-05-22 ENCOUNTER — Ambulatory Visit: Payer: Medicare HMO | Attending: Cardiology

## 2023-05-22 DIAGNOSIS — R2681 Unsteadiness on feet: Secondary | ICD-10-CM | POA: Insufficient documentation

## 2023-05-22 DIAGNOSIS — R0600 Dyspnea, unspecified: Secondary | ICD-10-CM

## 2023-05-22 DIAGNOSIS — R600 Localized edema: Secondary | ICD-10-CM

## 2023-05-22 DIAGNOSIS — R262 Difficulty in walking, not elsewhere classified: Secondary | ICD-10-CM | POA: Insufficient documentation

## 2023-05-22 DIAGNOSIS — Z9181 History of falling: Secondary | ICD-10-CM | POA: Insufficient documentation

## 2023-05-22 LAB — ECHOCARDIOGRAM COMPLETE
AR max vel: 3.28 cm2
AV Area VTI: 3.12 cm2
AV Area mean vel: 3.15 cm2
AV Mean grad: 5 mm[Hg]
AV Peak grad: 8.6 mm[Hg]
Ao pk vel: 1.47 m/s
Area-P 1/2: 3.03 cm2
Calc EF: 57 %
S' Lateral: 3.8 cm
Single Plane A2C EF: 50.5 %
Single Plane A4C EF: 62.4 %

## 2023-05-22 NOTE — Therapy (Signed)
OUTPATIENT PHYSICAL THERAPY  Treatment   Patient Name: William Taylor MRN: [id number] DOB:Jul 15, 1930, 89 y.o., male Today's Date: 05/22/2023  END OF SESSION:  PT End of Session - 05/22/23 1516     Visit Number 3    Number of Visits 17    Date for PT Re-Evaluation 07/11/23    PT Start Time 1516    PT Stop Time 1555    PT Time Calculation (min) 39 min    Equipment Utilized During Treatment Gait belt;Other (comment)   SPC   Activity Tolerance Patient tolerated treatment well    Behavior During Therapy WFL for tasks assessed/performed               Past Medical History:  Diagnosis Date   Cancer [facility])    Prostate, followed by Dr. Achilles Dunk   Colon polyp    Coronary artery disease    a. 2005 s/p PCI LAD ([facility]); b 2012 s/p PCI OM2; c.10/2013 Cath: LAD 50m ISR, 60m, D1 90, D2 70, OM2 80 w/ patent stent;  d. 06/2019 Low risk MV; e. 07/2020 MV: EF 77%, no ischemia/infarct.   Gall stones    Hyperlipidemia    Hypertension    MI (myocardial infarction) (HCC)    2015   Orthostatic hypotension    a. prev on midodrine/florinef/northera.   PAF (paroxysmal atrial fibrillation) (HCC)    a. on xarelto and amio (CHA2DS2VASc = 4).   Skin cancer    Past Surgical History:  Procedure Laterality Date   CARDIAC CATHETERIZATION  10/17/2013   [facility]   CATARACT EXTRACTION  05/22/2011   right eye   colonoscopy     COLONOSCOPY W/ POLYPECTOMY  08/19/2013   Dr Shelle Iron   COLONOSCOPY WITH PROPOFOL N/A 08/15/2016   Procedure: COLONOSCOPY WITH PROPOFOL;  Surgeon: Wyline Mood, MD;  Location: [facility] ENDOSCOPY;  Service: Endoscopy;  Laterality: N/A;   CORONARY ANGIOPLASTY  08/20/2007   2005; s/p stent   ENTEROSCOPY N/A 09/22/2018   Procedure: ENTEROSCOPY;  Surgeon: Toney Reil, MD;  Location: [facility] ENDOSCOPY;  Service: Gastroenterology;  Laterality: N/A;   ESOPHAGOGASTRODUODENOSCOPY (EGD) WITH PROPOFOL N/A 08/15/2016   Procedure: ESOPHAGOGASTRODUODENOSCOPY (EGD) WITH PROPOFOL;  Surgeon: Wyline Mood,  MD;  Location: [facility] ENDOSCOPY;  Service: Endoscopy;  Laterality: N/A;   GIVENS CAPSULE STUDY N/A 09/26/2016   Procedure: GIVENS CAPSULE STUDY;  Surgeon: Wyline Mood, MD;  Location: [facility] ENDOSCOPY;  Service: Endoscopy;  Laterality: N/A;   HERNIA REPAIR  08/20/2011   LEFT HEART CATH AND CORONARY ANGIOGRAPHY N/A 04/29/2022   Procedure: LEFT HEART CATH AND CORONARY ANGIOGRAPHY;  Surgeon: Iran Ouch, MD;  Location: [facility] INVASIVE CV LAB;  Service: Cardiovascular;  Laterality: N/A;   LUMBAR SPINE SURGERY     x2 in [place] in 1990's   UPPER GI ENDOSCOPY  04/19/2014   Dr Shelle Iron   Patient Active Problem List   Diagnosis Date Noted   Balance problem 04/30/2023   Compression fracture of T2 vertebra (HCC) 03/05/2023   Cerebral infarction, chronic 03/05/2023   Thyroid nodule 03/05/2023   Chronic constipation 08/06/2022   PVC's (premature ventricular contractions) 05/07/2022   Neuropathic pain 04/04/2022   Spondylolisthesis, lumbar region 03/14/2022   Hypophosphatemia 09/02/2021   Allergic rhinitis 08/30/2020   Coronary artery disease    GERD (gastroesophageal reflux disease)    Benign prostatic hyperplasia with lower urinary tract symptoms 08/13/2020   Fall 11/11/2019   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   Osteoarthritis of knees, bilateral 08/31/2019   History of GI  bleed    Orthostatic hypotension 06/06/2018   Chronic pain of left knee 06/05/2018   Chronic back pain 02/17/2018   Angiodysplasia of intestinal tract    Iron deficiency anemia    Benign neoplasm of ascending colon    Diverticulosis of large intestine without diverticulitis    Gastric polyp    Left rotator cuff tear arthropathy 09/11/2015   Fatigue 04/15/2014   Prostate cancer (HCC) 03/15/2013   DOE (dyspnea on exertion) 11/23/2012   Insomnia 09/03/2012   Hypertension 07/23/2012   CKD (chronic kidney disease), stage IIIa 07/23/2011   History of pulmonary embolism 01/09/2011   Paroxysmal atrial fibrillation (HCC)  09/06/2010   Mixed hyperlipidemia 05/19/2009    PCP: Glori Luis, MD  REFERRING PROVIDER: Glori Luis, MD  REFERRING DIAG: R26.89 (ICD-10-CM) - Balance problem  Rationale for Evaluation and Treatment: Rehabilitation  THERAPY DIAG:  Unsteadiness on feet  Difficulty in walking, not elsewhere classified  History of falling  ONSET DATE: 2-3 months ago  SUBJECTIVE:                                                                                                                                                                                           SUBJECTIVE STATEMENT: No complaints. Wearing a brace L knee.    PERTINENT HISTORY:  Balance. Has been using a SPC for about 8-9 months and a rw at night for about 5-6 months. Has had difficulty with his balance about 2-3 months ago and has not improved. Had 2 back operations years ago. Took a bad fall many years ago and screwed up both knees. Went to the ER about month ago and had chest and upper back pain with sweating. Has happened several times before. Has had 2 stents and one heart attack. Can have CPR performed if needed.   Feels like losing his balance when he turns while talking or doing something.   No latex allergies  Blood pressure is pretty much controlled per pt.     PAIN:  Are you having pain? No  PRECAUTIONS: Fall, Has nitroglycerin for his heart  RED FLAGS: Bowel or bladder incontinence: No and Cauda equina syndrome: No   WEIGHT BEARING RESTRICTIONS: No  FALLS:  Has patient fallen in last 6 months? Yes. Number of falls 1  At night. Uses a walker to go to the bathroom and his R foot got caught at the edge of the walker.   LIVING ENVIRONMENT: Lives with: lives with their spouse Lives in: House/apartment Stairs: No Has following equipment at home: Single point cane, Environmental consultant - 2 wheeled, and Grab bars  OCCUPATION: retired  PLOF: Independent with household mobility with device  PATIENT GOALS: Walk  better.   NEXT MD VISIT: none  OBJECTIVE:   DIAGNOSTIC FINDINGS:  CT Head Wo Contrast 02/26/2023   CLINICAL DATA:  Head trauma, moderate-severe; Neck trauma (Age >= 65y)   EXAM: CT HEAD WITHOUT CONTRAST   CT CERVICAL SPINE WITHOUT CONTRAST   TECHNIQUE: Multidetector CT imaging of the head and cervical spine was performed following the standard protocol without intravenous contrast. Multiplanar CT image reconstructions of the cervical spine were also generated.   RADIATION DOSE REDUCTION: This exam was performed according to the departmental dose-optimization program which includes automated exposure control, adjustment of the mA and/or kV according to patient size and/or use of iterative reconstruction technique.   COMPARISON:  CT Chest 07/24/22   FINDINGS: CT HEAD FINDINGS   Brain: No evidence of acute infarction, hemorrhage, hydrocephalus, extra-axial collection or mass lesion/mass effect. Most likely a chronic, but technically age indeterminate infarct in the right cerebellum. There is a chronic left parietal lobe infarct. Sequela of moderate chronic microvascular ischemic change.   Vascular: No hyperdense vessel or unexpected calcification.   Skull: Soft tissue swelling along the right frontal scalp. No evidence of an underlying calvarial fracture.   Sinuses/Orbits: No middle ear or mastoid effusion. Paranasal sinuses are clear. Right lens replacement. Orbits are otherwise unremarkable.   Other: None.   CT CERVICAL SPINE FINDINGS   Alignment: Trace anterolisthesis of C4 on C5.   Skull base and vertebrae: No acute cervical spine fracture. No primary bone lesion or focal pathologic process. There is an acute appearing superior endplate compression deformity at T2   Soft tissues and spinal canal: No prevertebral fluid or swelling. No visible canal hematoma.   Disc levels:  No evidence of high-grade spinal canal stenosis.   Upper chest: Negative    Other: There is a 1.7 cm left thyroid nodule (series 4, image 94). Recommend further evaluation with dedicated thyroid ultrasound, if not previously performed.   IMPRESSION: 1. No acute intracranial abnormality. 2. Soft tissue swelling along the right frontal scalp. No evidence of an underlying calvarial fracture. 3. No acute cervical spine fracture. 4. Acute superior endplate compression deformity at T2. 5. There is a 1.7 cm left thyroid nodule. Recommend further evaluation with dedicated thyroid ultrasound, if not previously performed.     Electronically Signed   By: Lorenza Cambridge M.D.   On: 02/26/2023 09:53  PATIENT SURVEYS:    SCREENING FOR RED FLAGS: Bowel or bladder incontinence: No  Cauda equina syndrome: No   COGNITION: Overall cognitive status: Within functional limits for tasks assessed     SENSATION:     POSTURE:   PALPATION:   Cervical AROM:   AROM 05/19/2023  Flexion   Extension   Right lateral flexion   Left lateral flexion   Right rotation 40  Left rotation 35   (Blank rows = not tested)  LOWER EXTREMITY ROM:     Active  Right 05/19/2023 Left 05/19/2023  Hip flexion    Hip extension (seated manually resisted) 4 4-  Hip abduction (seated manually resisted, hips less than 90 degrees flexion) 4+ 4+  Hip adduction    Hip internal rotation    Hip external rotation    Knee flexion    Knee extension    Ankle dorsiflexion    Ankle plantarflexion    Ankle inversion    Ankle eversion     (Blank rows = not tested)   Very limited R and  L cervical rotation AROM as well as very limited cervical extension AROM observed    LOWER EXTREMITY MMT:    MMT Right eval Left eval  Hip flexion    Hip extension    Hip abduction    Hip adduction    Hip internal rotation    Hip external rotation    Knee flexion    Knee extension    Ankle dorsiflexion    Ankle plantarflexion    Ankle inversion    Ankle eversion     (Blank rows = not  tested)  LUMBAR SPECIAL TESTS:    FUNCTIONAL TESTS:  5 times sit to stand: 21 seconds with B UE assist Timed up and go (TUG): 14.2 seconds without AD  Dynamic Gait Index: 15 with SPC on R side   TUG with SPC 17.21 seconds (first try)   Without SPC 15.61, 13.05, 13.96  seconds (14.2 seconds average without AD)   Observation: stand to sit, pt not in front of chair prior to sitting down, almost missed the chair when turning to sit after gait.   GAIT: Distance walked: 40 ft Assistive device utilized: Single point cane Level of assistance: CGA Comments: decreased stance R LE, SPC in R side      TODAY'S TREATMENT:                                                                                                                              DATE: 05/22/2023    Therapeutic exercise  Standing B UE assist  Hip abduction   R 10x5 seconds    L 10x5 seconds     Then with yellow band around ankles   R 10x3   L 10x3   Hip extension with yellow band around ankles   R 10x   L 10x  Bent over hip extension on elevated mat table.   R 10x2 with 5 second holds  L 10x2 with 5 second holds  (Pt too tall for elevated mat table for this exercise).   SLS with B UE assist PRN  R 10x5 seconds for 2 sets  L 10x5 seconds for 2 sets   Standing 360 degree turns without UE assist (pt just holding his SPC)  To the R 4x  To the L 4x  Cues to increase step length and hip flexion to decrease shuffling.      Improved exercise technique, movement at target joints, use of target muscles after mod verbal, visual, tactile cues.      PATIENT EDUCATION:  Education details: POC Person educated: Patient Education method: Explanation Education comprehension: verbalized understanding  HOME EXERCISE PROGRAM: Access Code: [email or web address] URL: [email or web address] Date: 05/19/2023 Prepared by: Loralyn Freshwater  Exercises - Seated Scapular Retraction  - 2 x daily - 7 x weekly - 3 sets -  10 reps - 5 seconds hold  ASSESSMENT:  CLINICAL IMPRESSION:  Worked on glute strengthening to promote single leg balance strength for gait  and obstacle negotiation. Worked on single leg balance with UE assist PRN, in which pt was eventually able to maintain L single leg stand balance for 5 seconds without UE assist. Continued working on 360 degree turns R and L secondary to pt reports of difficulty with balance with that movement. Verbal cues needed to increase step length and hip flexion for foot clearance to decrease shuffling while turning around.  Pt tolerated session well without aggravation of symptoms. Pt will benefit from continued skilled physical therapy services to improve strength, balance, and function.         OBJECTIVE IMPAIRMENTS: Abnormal gait, decreased activity tolerance, decreased balance, difficulty walking, decreased strength, improper body mechanics, and postural dysfunction.   ACTIVITY LIMITATIONS: carrying, lifting, standing, stairs, transfers, and locomotion level  PARTICIPATION LIMITATIONS:   PERSONAL FACTORS: Age, Fitness, Past/current experiences, and 3+ comorbidities: CA, CAD, HTN, MI, orthostaic hypotension, PAF  are also affecting patient's functional outcome.   REHAB POTENTIAL: Fair    CLINICAL DECISION MAKING: Stable/uncomplicated  EVALUATION COMPLEXITY: Low   GOALS: Goals reviewed with patient? Yes  SHORT TERM GOALS: Target date: 06/06/2023   Pt will be independent with his initial HEP to improve strength, balance, and function.  Baseline: Pt has not yet started his initial HEP (05/14/2023) Goal status: INITIAL  LONG TERM GOALS: Target date: 07/11/2023  Pt will improve his 5 times sit to stand time to 12 seconds or less without UE assist as a demonstration of improved functional LE strength.  Baseline: 21 seconds with B UE assist (05/14/2023) Goal status: INITIAL  2.  Pt will improve his TUG time to 12 seconds or less without AD as a  demonstration of improved functional mobility and balance. Baseline: Timed up and go (TUG): 14.2 seconds without AD (05/14/2023) Goal status: INITIAL  3.  Pt will improve his Dynamic Gait Index score to 19 or more without AD as a demonstration of improved balance.  Baseline: Dynamic Gait Index: 15 with SPC on R side (05/14/2023) Goal status: INITIAL  PLAN:  PT FREQUENCY: 2x/week  PT DURATION: 8 weeks  PLANNED INTERVENTIONS: Therapeutic exercises, Therapeutic activity, Neuromuscular re-education, Balance training, Gait training, Patient/Family education, Stair training, Vestibular training, Canalith repositioning, Dry Needling, Electrical stimulation, Manual therapy, and Re-evaluation.  PLAN FOR NEXT SESSION: Functional LE, glute med, max, quadriceps strengthening, balance, manual techniques, modalities PRN.   [address] PT, DPT  05/22/2023, 4:02 PM

## 2023-05-27 ENCOUNTER — Ambulatory Visit: Payer: Medicare HMO

## 2023-05-27 DIAGNOSIS — R2681 Unsteadiness on feet: Secondary | ICD-10-CM

## 2023-05-27 DIAGNOSIS — Z9181 History of falling: Secondary | ICD-10-CM

## 2023-05-27 DIAGNOSIS — R262 Difficulty in walking, not elsewhere classified: Secondary | ICD-10-CM

## 2023-05-27 NOTE — Therapy (Signed)
OUTPATIENT PHYSICAL THERAPY  Treatment   Patient Name: William Taylor MRN: [id number] DOB:21-Feb-1930, 89 y.o., male Today's Date: 05/27/2023  END OF SESSION:  PT End of Session - 05/27/23 1430     Visit Number 4    Number of Visits 17    Date for PT Re-Evaluation 07/11/23    PT Start Time 1430    PT Stop Time 1510    PT Time Calculation (min) 40 min    Equipment Utilized During Treatment Gait belt;Other (comment)   SPC   Activity Tolerance Patient tolerated treatment well    Behavior During Therapy WFL for tasks assessed/performed                Past Medical History:  Diagnosis Date   Cancer [facility])    Prostate, followed by Dr. Achilles Dunk   Colon polyp    Coronary artery disease    a. 2005 s/p PCI LAD ([facility]); b 2012 s/p PCI OM2; c.10/2013 Cath: LAD 2m ISR, 16m, D1 90, D2 70, OM2 80 w/ patent stent;  d. 06/2019 Low risk MV; e. 07/2020 MV: EF 77%, no ischemia/infarct.   Gall stones    Hyperlipidemia    Hypertension    MI (myocardial infarction) (HCC)    2015   Orthostatic hypotension    a. prev on midodrine/florinef/northera.   PAF (paroxysmal atrial fibrillation) (HCC)    a. on xarelto and amio (CHA2DS2VASc = 4).   Skin cancer    Past Surgical History:  Procedure Laterality Date   CARDIAC CATHETERIZATION  10/17/2013   [facility]   CATARACT EXTRACTION  05/22/2011   right eye   colonoscopy     COLONOSCOPY W/ POLYPECTOMY  08/19/2013   Dr Shelle Iron   COLONOSCOPY WITH PROPOFOL N/A 08/15/2016   Procedure: COLONOSCOPY WITH PROPOFOL;  Surgeon: Wyline Mood, MD;  Location: [facility] ENDOSCOPY;  Service: Endoscopy;  Laterality: N/A;   CORONARY ANGIOPLASTY  08/20/2007   2005; s/p stent   ENTEROSCOPY N/A 09/22/2018   Procedure: ENTEROSCOPY;  Surgeon: Toney Reil, MD;  Location: [facility] ENDOSCOPY;  Service: Gastroenterology;  Laterality: N/A;   ESOPHAGOGASTRODUODENOSCOPY (EGD) WITH PROPOFOL N/A 08/15/2016   Procedure: ESOPHAGOGASTRODUODENOSCOPY (EGD) WITH PROPOFOL;  Surgeon: Wyline Mood,  MD;  Location: [facility] ENDOSCOPY;  Service: Endoscopy;  Laterality: N/A;   GIVENS CAPSULE STUDY N/A 09/26/2016   Procedure: GIVENS CAPSULE STUDY;  Surgeon: Wyline Mood, MD;  Location: [facility] ENDOSCOPY;  Service: Endoscopy;  Laterality: N/A;   HERNIA REPAIR  08/20/2011   LEFT HEART CATH AND CORONARY ANGIOGRAPHY N/A 04/29/2022   Procedure: LEFT HEART CATH AND CORONARY ANGIOGRAPHY;  Surgeon: Iran Ouch, MD;  Location: [facility] INVASIVE CV LAB;  Service: Cardiovascular;  Laterality: N/A;   LUMBAR SPINE SURGERY     x2 in [place] in 1990's   UPPER GI ENDOSCOPY  04/19/2014   Dr Shelle Iron   Patient Active Problem List   Diagnosis Date Noted   Balance problem 04/30/2023   Compression fracture of T2 vertebra (HCC) 03/05/2023   Cerebral infarction, chronic 03/05/2023   Thyroid nodule 03/05/2023   Chronic constipation 08/06/2022   PVC's (premature ventricular contractions) 05/07/2022   Neuropathic pain 04/04/2022   Spondylolisthesis, lumbar region 03/14/2022   Hypophosphatemia 09/02/2021   Allergic rhinitis 08/30/2020   Coronary artery disease    GERD (gastroesophageal reflux disease)    Benign prostatic hyperplasia with lower urinary tract symptoms 08/13/2020   Fall 11/11/2019   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   Osteoarthritis of knees, bilateral 08/31/2019   History of  GI bleed    Orthostatic hypotension 06/06/2018   Chronic pain of left knee 06/05/2018   Chronic back pain 02/17/2018   Angiodysplasia of intestinal tract    Iron deficiency anemia    Benign neoplasm of ascending colon    Diverticulosis of large intestine without diverticulitis    Gastric polyp    Left rotator cuff tear arthropathy 09/11/2015   Fatigue 04/15/2014   Prostate cancer (HCC) 03/15/2013   DOE (dyspnea on exertion) 11/23/2012   Insomnia 09/03/2012   Hypertension 07/23/2012   CKD (chronic kidney disease), stage IIIa 07/23/2011   History of pulmonary embolism 01/09/2011   Paroxysmal atrial fibrillation (HCC)  09/06/2010   Mixed hyperlipidemia 05/19/2009    PCP: Glori Luis, MD  REFERRING PROVIDER: Glori Luis, MD  REFERRING DIAG: R26.89 (ICD-10-CM) - Balance problem  Rationale for Evaluation and Treatment: Rehabilitation  THERAPY DIAG:  Unsteadiness on feet  Difficulty in walking, not elsewhere classified  History of falling  ONSET DATE: 2-3 months ago  SUBJECTIVE:                                                                                                                                                                                           SUBJECTIVE STATEMENT: Doing ok. Just getting old.    PERTINENT HISTORY:  Balance. Has been using a SPC for about 8-9 months and a rw at night for about 5-6 months. Has had difficulty with his balance about 2-3 months ago and has not improved. Had 2 back operations years ago. Took a bad fall many years ago and screwed up both knees. Went to the ER about month ago and had chest and upper back pain with sweating. Has happened several times before. Has had 2 stents and one heart attack. Can have CPR performed if needed.   Feels like losing his balance when he turns while talking or doing something.   No latex allergies  Blood pressure is pretty much controlled per pt.     PAIN:  Are you having pain? No  PRECAUTIONS: Fall, Has nitroglycerin for his heart  RED FLAGS: Bowel or bladder incontinence: No and Cauda equina syndrome: No   WEIGHT BEARING RESTRICTIONS: No  FALLS:  Has patient fallen in last 6 months? Yes. Number of falls 1  At night. Uses a walker to go to the bathroom and his R foot got caught at the edge of the walker.   LIVING ENVIRONMENT: Lives with: lives with their spouse Lives in: House/apartment Stairs: No Has following equipment at home: Single point cane, Environmental consultant - 2 wheeled, and Grab bars  OCCUPATION: retired  PLOF:  Independent with household mobility with device  PATIENT GOALS: Walk better.    NEXT MD VISIT: none  OBJECTIVE:   DIAGNOSTIC FINDINGS:  CT Head Wo Contrast 02/26/2023   CLINICAL DATA:  Head trauma, moderate-severe; Neck trauma (Age >= 65y)   EXAM: CT HEAD WITHOUT CONTRAST   CT CERVICAL SPINE WITHOUT CONTRAST   TECHNIQUE: Multidetector CT imaging of the head and cervical spine was performed following the standard protocol without intravenous contrast. Multiplanar CT image reconstructions of the cervical spine were also generated.   RADIATION DOSE REDUCTION: This exam was performed according to the departmental dose-optimization program which includes automated exposure control, adjustment of the mA and/or kV according to patient size and/or use of iterative reconstruction technique.   COMPARISON:  CT Chest 07/24/22   FINDINGS: CT HEAD FINDINGS   Brain: No evidence of acute infarction, hemorrhage, hydrocephalus, extra-axial collection or mass lesion/mass effect. Most likely a chronic, but technically age indeterminate infarct in the right cerebellum. There is a chronic left parietal lobe infarct. Sequela of moderate chronic microvascular ischemic change.   Vascular: No hyperdense vessel or unexpected calcification.   Skull: Soft tissue swelling along the right frontal scalp. No evidence of an underlying calvarial fracture.   Sinuses/Orbits: No middle ear or mastoid effusion. Paranasal sinuses are clear. Right lens replacement. Orbits are otherwise unremarkable.   Other: None.   CT CERVICAL SPINE FINDINGS   Alignment: Trace anterolisthesis of C4 on C5.   Skull base and vertebrae: No acute cervical spine fracture. No primary bone lesion or focal pathologic process. There is an acute appearing superior endplate compression deformity at T2   Soft tissues and spinal canal: No prevertebral fluid or swelling. No visible canal hematoma.   Disc levels:  No evidence of high-grade spinal canal stenosis.   Upper chest: Negative   Other: There  is a 1.7 cm left thyroid nodule (series 4, image 94). Recommend further evaluation with dedicated thyroid ultrasound, if not previously performed.   IMPRESSION: 1. No acute intracranial abnormality. 2. Soft tissue swelling along the right frontal scalp. No evidence of an underlying calvarial fracture. 3. No acute cervical spine fracture. 4. Acute superior endplate compression deformity at T2. 5. There is a 1.7 cm left thyroid nodule. Recommend further evaluation with dedicated thyroid ultrasound, if not previously performed.     Electronically Signed   By: Lorenza Cambridge M.D.   On: 02/26/2023 09:53  PATIENT SURVEYS:    SCREENING FOR RED FLAGS: Bowel or bladder incontinence: No  Cauda equina syndrome: No   COGNITION: Overall cognitive status: Within functional limits for tasks assessed     SENSATION:     POSTURE:   PALPATION:   Cervical AROM:   AROM 05/19/2023  Flexion   Extension   Right lateral flexion   Left lateral flexion   Right rotation 40  Left rotation 35   (Blank rows = not tested)  LOWER EXTREMITY ROM:     Active  Right 05/19/2023 Left 05/19/2023  Hip flexion    Hip extension (seated manually resisted) 4 4-  Hip abduction (seated manually resisted, hips less than 90 degrees flexion) 4+ 4+  Hip adduction    Hip internal rotation    Hip external rotation    Knee flexion    Knee extension    Ankle dorsiflexion    Ankle plantarflexion    Ankle inversion    Ankle eversion     (Blank rows = not tested)   Very limited R and L  cervical rotation AROM as well as very limited cervical extension AROM observed    LOWER EXTREMITY MMT:    MMT Right eval Left eval  Hip flexion    Hip extension    Hip abduction    Hip adduction    Hip internal rotation    Hip external rotation    Knee flexion    Knee extension    Ankle dorsiflexion    Ankle plantarflexion    Ankle inversion    Ankle eversion     (Blank rows = not tested)  LUMBAR  SPECIAL TESTS:    FUNCTIONAL TESTS:  5 times sit to stand: 21 seconds with B UE assist Timed up and go (TUG): 14.2 seconds without AD  Dynamic Gait Index: 15 with SPC on R side   TUG with SPC 17.21 seconds (first try)   Without SPC 15.61, 13.05, 13.96  seconds (14.2 seconds average without AD)   Observation: stand to sit, pt not in front of chair prior to sitting down, almost missed the chair when turning to sit after gait.   GAIT: Distance walked: 40 ft Assistive device utilized: Single point cane Level of assistance: CGA Comments: decreased stance R LE, SPC in R side      TODAY'S TREATMENT:                                                                                                                              DATE: 05/27/2023    Therapeutic exercise  Standing B UE assist  Hip abduction   with yellow band around ankles   R 10x3   L 10x3   Hip extension with yellow band around ankles   R 10x3   L 10x3  Gait with R and L head turns 32 ft x 4  R LE unsteadiness with R cervical rotation observed  Seated B scapular retraction 10x5 seconds   Pt states having a hx of T2 fracture    Standing 360 degree turns without UE assist (pt just holding his SPC)  To the R 4x  To the L 4x  Cues to increase step length and hip flexion to decrease shuffling.    Stepping over 2 mini hurdles without UE assist 10x each LE    SLS with B UE assist PRN  R 10x5 seconds for 2 sets  L 10x5 seconds for 2 sets  Standing alternating toe taps onto treadmill platform with UE assist PRN   10x each LE    Improved exercise technique, movement at target joints, use of target muscles after mod verbal, visual, tactile cues.     PATIENT EDUCATION:  Education details: POC Person educated: Patient Education method: Explanation Education comprehension: verbalized understanding  HOME EXERCISE PROGRAM: Access Code: [email or web address] URL: [email or web address] Date:  05/19/2023 Prepared by: Loralyn Freshwater  Exercises - Seated Scapular Retraction  - 2 x daily - 7 x weekly - 3 sets - 10 reps - 5 seconds hold  -  Standing Hip Abduction with Resistance at Ankles and Counter Support  - 1 x daily - 7 x weekly - 3 sets - 10 reps  Yellow band    ASSESSMENT:  CLINICAL IMPRESSION:   Continued working on glute strengthening to promote single leg balance strength for gait and obstacle negotiation.  Continued working on 360 degree turns R and L secondary to pt reports of difficulty with balance with that movement. Verbal cues needed to increase step length and hip flexion for foot clearance to decrease shuffling while turning around.  Pt tolerated session well without aggravation of symptoms. Pt will benefit from continued skilled physical therapy services to improve strength, balance, and function.         OBJECTIVE IMPAIRMENTS: Abnormal gait, decreased activity tolerance, decreased balance, difficulty walking, decreased strength, improper body mechanics, and postural dysfunction.   ACTIVITY LIMITATIONS: carrying, lifting, standing, stairs, transfers, and locomotion level  PARTICIPATION LIMITATIONS:   PERSONAL FACTORS: Age, Fitness, Past/current experiences, and 3+ comorbidities: CA, CAD, HTN, MI, orthostaic hypotension, PAF  are also affecting patient's functional outcome.   REHAB POTENTIAL: Fair    CLINICAL DECISION MAKING: Stable/uncomplicated  EVALUATION COMPLEXITY: Low   GOALS: Goals reviewed with patient? Yes  SHORT TERM GOALS: Target date: 06/06/2023   Pt will be independent with his initial HEP to improve strength, balance, and function.  Baseline: Pt has not yet started his initial HEP (05/14/2023) Goal status: INITIAL  LONG TERM GOALS: Target date: 07/11/2023  Pt will improve his 5 times sit to stand time to 12 seconds or less without UE assist as a demonstration of improved functional LE strength.  Baseline: 21 seconds with B UE  assist (05/14/2023) Goal status: INITIAL  2.  Pt will improve his TUG time to 12 seconds or less without AD as a demonstration of improved functional mobility and balance. Baseline: Timed up and go (TUG): 14.2 seconds without AD (05/14/2023) Goal status: INITIAL  3.  Pt will improve his Dynamic Gait Index score to 19 or more without AD as a demonstration of improved balance.  Baseline: Dynamic Gait Index: 15 with SPC on R side (05/14/2023) Goal status: INITIAL  PLAN:  PT FREQUENCY: 2x/week  PT DURATION: 8 weeks  PLANNED INTERVENTIONS: Therapeutic exercises, Therapeutic activity, Neuromuscular re-education, Balance training, Gait training, Patient/Family education, Stair training, Vestibular training, Canalith repositioning, Dry Needling, Electrical stimulation, Manual therapy, and Re-evaluation.  PLAN FOR NEXT SESSION: Functional LE, glute med, max, quadriceps strengthening, balance, manual techniques, modalities PRN.   [address] PT, DPT  05/27/2023, 4:28 PM

## 2023-05-27 NOTE — Progress Notes (Signed)
Heart squeeze estimated 60 to 65%, no wall motion abnormalities noted.  There is some muscle stiffness noted which comes with age and/or blood pressure.  There were no valvular abnormalities that were noted.  There is mild dilatation of the aortic root measuring 40 mm and mild dilatation of the ascending aorta measuring 41 mm.  Overall reassuring study.

## 2023-05-29 ENCOUNTER — Ambulatory Visit: Payer: Medicare HMO

## 2023-05-29 DIAGNOSIS — Z9181 History of falling: Secondary | ICD-10-CM

## 2023-05-29 DIAGNOSIS — R262 Difficulty in walking, not elsewhere classified: Secondary | ICD-10-CM

## 2023-05-29 DIAGNOSIS — R2681 Unsteadiness on feet: Secondary | ICD-10-CM | POA: Diagnosis not present

## 2023-05-29 NOTE — Progress Notes (Signed)
Date:  05/30/2023   ID:  William Taylor, DOB [birth year], MRN [id number]  Patient Location:  [address] [place] [address]   Provider location:   [facility], [place] office  PCP:  William Luis, MD  Cardiologist:  William Taylor [facility]  Chief Complaint  Patient presents with   6 month follow up     Patient c/o shortness of breath with over exertion. Medications reviewed by the patient verbally.     History of Present Illness:    William Taylor is a 89 y.o. male  past medical history of coronary artery disease, stent in 2005, cypher LAD atrial fibrillation with RVR in the setting of UTI September 2011,  cardiac catheterization Dec 25 2010, showing 90% OM 2 disease, transferred to [facility]  with DES stent placed, 2.25 x 16 mm POMUS stent 10/2013: Cardiac catheterization performed given his severe chest pain and no disease showing severe small vessel disease, patent stents PE following catheterization started on anticoagulation, atrial fibrillation in 2013,  Last atrial fibrillation July 2018  atypical chest pain July 2018 History of anemia Stress test low risk in 06/2019 Prior rapid weight loss 50 pounds with associated hypotension requiring Northera , Midodrine Florinef , these have been weaned off with weight gain Chronic chest pain Severe asymmetric septal hypertrophy on MRI 1.6 cm septum March 2022 who presents for follow-up of his coronary disease,  atrial fibrillation, chronic chest pain  Last seen by myself in clinic February 2024 Seen by one of our providers September 2024  In follow-up today reports he is working with PT/rehab for leg weakness  On exertion has some mild shortness of breath  Lab work reviewed hemoglobin 10.2, normal Taylor studies Denies significant leg swelling concerning for fluid retention Blood pressure well-controlled, maintaining normal sinus rhythm  Echocardiogram May 22, 2023 Heart squeeze  estimated 60 to 65%, no wall motion abnormalities noted.  There is some muscle stiffness noted which comes with age and/or blood pressure.  There were no valvular abnormalities that were noted.  There is mild dilatation of the aortic root measuring 40 mm and mild dilatation of the ascending aorta measuring 41 mm.   Recent events reviewed  [facility] emergency department on 04/20/2023 with complaints of chest pain.  Sitting to standing position developed chest discomfort, better after nitro x 2, no recurrent pain  Cardiac catheterization September 2023 Patent LAD and OM 3 stents, moderate nonobstructive coronary disease Medical management recommended  EKG personally reviewed by myself on todays visit EKG Interpretation Date/Time:  Friday May 30 2023 14:08:24 EDT Ventricular Rate:  80 PR Interval:  202 QRS Duration:  92 QT Interval:  396 QTC Calculation: 456 R Axis:   -46  Text Interpretation: Normal sinus rhythm Left axis deviation Low voltage QRS Inferior infarct , age undetermined When compared with ECG of 02-May-2023 10:16, No significant change was found Confirmed by William Taylor [phone]) on 05/30/2023 2:27:21 PM    Other past medical history reviewed In the hospital with covid 08/31/21 ED on 08/31/2021 with complaints of cough, shortness of breath, generalized weakness, poor p.o. intake.  He was seen at urgent care and had a near syncopal episode there, referred to the ED.  When seen in the ED here, he was ambulated and dropped oxygen saturation to 83% on room air.  COVID-19 PCR returned positive.  Blood pressure was soft at 99/64 in the ED.  Chest x-ray negative for pneumonia.  Admitted to  TRH service and started on remdesivir and steroids.  Chest x-ray negative for pneumonia.   COVID-19 PCR in the ED positive.   Patient dropped oxygen saturation to 83% with ambulation in the ED. -remdesivir, IV steroids Positive for orthostatic hypotension 1/14: positive orthostatic vitals with BP  dropping from 126/76 >> 113/79 >> 85/66 standing.    Echo 2/22: EF 60 percent Severe asymmetric basal septal hypertrophy.  Cardiac MRI reviewed No outflow tract obstruction    Past Medical History:  Diagnosis Date   Cancer [facility])    Prostate, followed by Dr. Achilles Taylor   Colon polyp    Coronary artery disease    a. 2005 s/p PCI LAD ([facility]); b 2012 s/p PCI OM2; c.10/2013 Cath: LAD 96m ISR, 28m, D1 90, D2 70, OM2 80 w/ patent stent;  d. 06/2019 Low risk MV; e. 07/2020 MV: EF 77%, no ischemia/infarct.   Gall stones    Hyperlipidemia    Hypertension    MI (myocardial infarction) (HCC)    2015   Orthostatic hypotension    a. prev on midodrine/florinef/northera.   PAF (paroxysmal atrial fibrillation) (HCC)    a. on xarelto and amio (CHA2DS2VASc = 4).   Skin cancer    Past Surgical History:  Procedure Laterality Date   CARDIAC CATHETERIZATION  10/17/2013   [facility]   CATARACT EXTRACTION  05/22/2011   right eye   colonoscopy     COLONOSCOPY W/ POLYPECTOMY  08/19/2013   Dr William Taylor   COLONOSCOPY WITH PROPOFOL N/A 08/15/2016   Procedure: COLONOSCOPY WITH PROPOFOL;  Surgeon: William Mood, MD;  Location: [facility] ENDOSCOPY;  Service: Endoscopy;  Laterality: N/A;   CORONARY ANGIOPLASTY  08/20/2007   2005; s/p stent   ENTEROSCOPY N/A 09/22/2018   Procedure: ENTEROSCOPY;  Surgeon: William Reil, MD;  Location: [facility] ENDOSCOPY;  Service: Gastroenterology;  Laterality: N/A;   ESOPHAGOGASTRODUODENOSCOPY (EGD) WITH PROPOFOL N/A 08/15/2016   Procedure: ESOPHAGOGASTRODUODENOSCOPY (EGD) WITH PROPOFOL;  Surgeon: William Mood, MD;  Location: [facility] ENDOSCOPY;  Service: Endoscopy;  Laterality: N/A;   GIVENS CAPSULE STUDY N/A 09/26/2016   Procedure: GIVENS CAPSULE STUDY;  Surgeon: William Mood, MD;  Location: [facility] ENDOSCOPY;  Service: Endoscopy;  Laterality: N/A;   HERNIA REPAIR  08/20/2011   LEFT HEART CATH AND CORONARY ANGIOGRAPHY N/A 04/29/2022   Procedure: LEFT HEART CATH AND CORONARY ANGIOGRAPHY;  Surgeon:  William Ouch, MD;  Location: [facility] INVASIVE CV LAB;  Service: Cardiovascular;  Laterality: N/A;   LUMBAR SPINE SURGERY     x2 in [place] in 1990's   UPPER GI ENDOSCOPY  04/19/2014   Dr William Taylor     Current Meds  Medication Sig   amiodarone (PACERONE) 100 MG tablet TAKE 1 TABLET BY MOUTH DAILY   cholecalciferol (VITAMIN D3) 25 MCG (1000 UNIT) tablet Take 1,000 Units by mouth daily.   fenofibrate micronized (ANTARA) 130 MG capsule TAKE 1 CAPSULE BY MOUTH DAILY  BEFORE BREAKFAST   finasteride (PROSCAR) 5 MG tablet TAKE 1 TABLET BY MOUTH DAILY   levobunolol (BETAGAN) 0.5 % ophthalmic solution Place 1 drop into the right eye 2 (two) times daily.   lisinopril (ZESTRIL) 10 MG tablet TAKE 1 TABLET BY MOUTH DAILY   loratadine (CLARITIN) 10 MG tablet Take 1 tablet (10 mg total) by mouth daily.   Multiple Vitamin (MULTIVITAMIN PO) Take 1 tablet by mouth daily.   nitroGLYCERIN (NITROSTAT) 0.4 MG SL tablet DISSOLVE UNDER THE TONGUE 1 TABLET EVERY 5 MINUTES AS NEEDED FOR CHEST PAIN   pantoprazole (PROTONIX) 40 MG tablet  TAKE ONE (1) TABLET BY MOUTH TWO TIMES PER DAY   pravastatin (PRAVACHOL) 40 MG tablet TAKE 1 TABLET BY MOUTH AT  BEDTIME   XARELTO 15 MG TABS tablet TAKE 1 TABLET BY MOUTH DAILY   zolpidem (AMBIEN) 5 MG tablet Take 1 tablet (5 mg total) by mouth at bedtime as needed for sleep.     Allergies:   Bee venom, Sulfasalazine, Sulfa antibiotics, Warfarin, and Warfarin and related   Social History   Tobacco Use   Smoking status: Never   Smokeless tobacco: Never  Vaping Use   Vaping status: Never Used  Substance Use Topics   Alcohol use: No   Drug use: No     Family Hx: The patient's family history includes Colon cancer in his father; Coronary artery disease in his brother; Other (age of onset: 69) in his brother; Stroke in his mother.  ROS:   Please see the history of present illness.    Review of Systems  Constitutional: Negative.   HENT: Negative.    Respiratory:  Positive for  shortness of breath.   Cardiovascular:  Positive for chest pain.  Gastrointestinal: Negative.   Musculoskeletal: Negative.   Neurological: Negative.   Psychiatric/Behavioral: Negative.    All other systems reviewed and are negative.   Labs/Other Tests and Data Reviewed:    Recent Labs: 07/24/2022: B Natriuretic Peptide 54.7 03/05/2023: ALT 10 04/20/2023: BUN 33; Creatinine, Ser 1.41; Potassium 4.4; Sodium 136 04/30/2023: TSH 5.21 05/07/2023: Hemoglobin 10.2; Platelet Count 260   Recent Lipid Panel Lab Results  Component Value Date/Time   CHOL 107 03/05/2023 08:35 AM   CHOL 119 10/27/2013 06:09 AM   TRIG 74.0 03/05/2023 08:35 AM   TRIG 61 10/27/2013 06:09 AM   HDL 40.00 03/05/2023 08:35 AM   HDL 38 (L) 10/27/2013 06:09 AM   CHOLHDL 3 03/05/2023 08:35 AM   LDLCALC 52 03/05/2023 08:35 AM   LDLCALC 69 10/27/2013 06:09 AM    Wt Readings from Last 3 Encounters:  05/30/23 188 lb 8 oz (85.5 kg)  05/07/23 188 lb 11.2 oz (85.6 kg)  05/02/23 188 lb 6.4 oz (85.5 kg)     Exam:    BP 124/80 (BP Location: Left Arm, Patient Position: Sitting, Cuff Size: Normal)   Pulse 80   Ht 6\' 4"  (1.93 m)   Wt 188 lb 8 oz (85.5 kg)   SpO2 97%   BMI 22.94 kg/m  Constitutional:  oriented to person, place, and time. No distress.  HENT:  Head: Grossly normal Eyes:  no discharge. No scleral icterus.  Neck: No JVD, no carotid bruits  Cardiovascular: Regular rate and rhythm, no murmurs appreciated Pulmonary/Chest: Clear to auscultation bilaterally, no wheezes or rails Abdominal: Soft.  no distension.  no tenderness.  Musculoskeletal: Normal range of motion Neurological:  normal muscle tone. Coordination normal. No atrophy Skin: Skin warm and dry Psychiatric: normal affect, pleasant   ASSESSMENT & PLAN:    orthostasis Associated with dramatic weight loss in 2020, possible depression At that time requiring on Northera ,  Florinef,  midodrine  Weight  stabilized, blood pressure stable, weaned off  medications  Mixed hyperlipidemia  Cholesterol is at goal on the current lipid regimen. No changes to the medications were made.  Coronary artery disease involving native coronary artery of native heart without angina pectoris Chronic chest pain dating back many years Cardiac catheterization September 2023, nonobstructive disease No further ischemic workup needed Recommend nitro sublingual for chest pain  Atrial fibrillation, unspecified type (HCC)  on Xarelto, amiodarone event monitor no significant atrial fibrillation Maintaining normal sinus rhythm  Depression Management primary care Weight improved, stable  Chronic kidney disease (CKD), stage III (moderate) GFR back to normal range, followed by nephrology   Taylor deficiency anemia, unspecified Taylor deficiency anemia type Baseline anemia Normal Taylor studies  Leg weakness Participating with rehab/PT Shortness of breath from deconditioning, anemia   Signed, William Nordmann, MD  05/30/2023 2:26 PM    [facility] [address], [place], [place] [address]

## 2023-05-29 NOTE — Therapy (Signed)
OUTPATIENT PHYSICAL THERAPY  Treatment   Patient Name: William Taylor MRN: [id number] DOB:1929-12-08, 89 y.o., male Today's Date: 05/29/2023  END OF SESSION:  PT End of Session - 05/29/23 1433     Visit Number 5    Number of Visits 17    Date for PT Re-Evaluation 07/11/23    PT Start Time 1433    PT Stop Time 1517    PT Time Calculation (min) 44 min    Equipment Utilized During Treatment Gait belt;Other (comment)   SPC   Activity Tolerance Patient tolerated treatment well    Behavior During Therapy WFL for tasks assessed/performed                 Past Medical History:  Diagnosis Date   Cancer [facility])    Prostate, followed by Dr. Achilles Dunk   Colon polyp    Coronary artery disease    a. 2005 s/p PCI LAD ([facility]); b 2012 s/p PCI OM2; c.10/2013 Cath: LAD 40m ISR, 28m, D1 90, D2 70, OM2 80 w/ patent stent;  d. 06/2019 Low risk MV; e. 07/2020 MV: EF 77%, no ischemia/infarct.   Gall stones    Hyperlipidemia    Hypertension    MI (myocardial infarction) (HCC)    2015   Orthostatic hypotension    a. prev on midodrine/florinef/northera.   PAF (paroxysmal atrial fibrillation) (HCC)    a. on xarelto and amio (CHA2DS2VASc = 4).   Skin cancer    Past Surgical History:  Procedure Laterality Date   CARDIAC CATHETERIZATION  10/17/2013   [facility]   CATARACT EXTRACTION  05/22/2011   right eye   colonoscopy     COLONOSCOPY W/ POLYPECTOMY  08/19/2013   Dr Shelle Iron   COLONOSCOPY WITH PROPOFOL N/A 08/15/2016   Procedure: COLONOSCOPY WITH PROPOFOL;  Surgeon: Wyline Mood, MD;  Location: [facility] ENDOSCOPY;  Service: Endoscopy;  Laterality: N/A;   CORONARY ANGIOPLASTY  08/20/2007   2005; s/p stent   ENTEROSCOPY N/A 09/22/2018   Procedure: ENTEROSCOPY;  Surgeon: Toney Reil, MD;  Location: [facility] ENDOSCOPY;  Service: Gastroenterology;  Laterality: N/A;   ESOPHAGOGASTRODUODENOSCOPY (EGD) WITH PROPOFOL N/A 08/15/2016   Procedure: ESOPHAGOGASTRODUODENOSCOPY (EGD) WITH PROPOFOL;  Surgeon: Wyline Mood, MD;  Location: [facility] ENDOSCOPY;  Service: Endoscopy;  Laterality: N/A;   GIVENS CAPSULE STUDY N/A 09/26/2016   Procedure: GIVENS CAPSULE STUDY;  Surgeon: Wyline Mood, MD;  Location: [facility] ENDOSCOPY;  Service: Endoscopy;  Laterality: N/A;   HERNIA REPAIR  08/20/2011   LEFT HEART CATH AND CORONARY ANGIOGRAPHY N/A 04/29/2022   Procedure: LEFT HEART CATH AND CORONARY ANGIOGRAPHY;  Surgeon: Iran Ouch, MD;  Location: [facility] INVASIVE CV LAB;  Service: Cardiovascular;  Laterality: N/A;   LUMBAR SPINE SURGERY     x2 in [place] in 1990's   UPPER GI ENDOSCOPY  04/19/2014   Dr Shelle Iron   Patient Active Problem List   Diagnosis Date Noted   Balance problem 04/30/2023   Compression fracture of T2 vertebra (HCC) 03/05/2023   Cerebral infarction, chronic 03/05/2023   Thyroid nodule 03/05/2023   Chronic constipation 08/06/2022   PVC's (premature ventricular contractions) 05/07/2022   Neuropathic pain 04/04/2022   Spondylolisthesis, lumbar region 03/14/2022   Hypophosphatemia 09/02/2021   Allergic rhinitis 08/30/2020   Coronary artery disease    GERD (gastroesophageal reflux disease)    Benign prostatic hyperplasia with lower urinary tract symptoms 08/13/2020   Fall 11/11/2019   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   Osteoarthritis of knees, bilateral 08/31/2019   History  of GI bleed    Orthostatic hypotension 06/06/2018   Chronic pain of left knee 06/05/2018   Chronic back pain 02/17/2018   Angiodysplasia of intestinal tract    Iron deficiency anemia    Benign neoplasm of ascending colon    Diverticulosis of large intestine without diverticulitis    Gastric polyp    Left rotator cuff tear arthropathy 09/11/2015   Fatigue 04/15/2014   Prostate cancer (HCC) 03/15/2013   DOE (dyspnea on exertion) 11/23/2012   Insomnia 09/03/2012   Hypertension 07/23/2012   CKD (chronic kidney disease), stage IIIa 07/23/2011   History of pulmonary embolism 01/09/2011   Paroxysmal atrial fibrillation  (HCC) 09/06/2010   Mixed hyperlipidemia 05/19/2009    PCP: Glori Luis, MD  REFERRING PROVIDER: Glori Luis, MD  REFERRING DIAG: R26.89 (ICD-10-CM) - Balance problem  Rationale for Evaluation and Treatment: Rehabilitation  THERAPY DIAG:  Unsteadiness on feet  Difficulty in walking, not elsewhere classified  History of falling  ONSET DATE: 2-3 months ago  SUBJECTIVE:                                                                                                                                                                                           SUBJECTIVE STATEMENT: Not bad   PERTINENT HISTORY:  Balance. Has been using a SPC for about 8-9 months and a rw at night for about 5-6 months. Has had difficulty with his balance about 2-3 months ago and has not improved. Had 2 back operations years ago. Took a bad fall many years ago and screwed up both knees. Went to the ER about month ago and had chest and upper back pain with sweating. Has happened several times before. Has had 2 stents and one heart attack. Can have CPR performed if needed.   Feels like losing his balance when he turns while talking or doing something.   No latex allergies  Blood pressure is pretty much controlled per pt.     PAIN:  Are you having pain? No  PRECAUTIONS: Fall, Has nitroglycerin for his heart  RED FLAGS: Bowel or bladder incontinence: No and Cauda equina syndrome: No   WEIGHT BEARING RESTRICTIONS: No  FALLS:  Has patient fallen in last 6 months? Yes. Number of falls 1  At night. Uses a walker to go to the bathroom and his R foot got caught at the edge of the walker.   LIVING ENVIRONMENT: Lives with: lives with their spouse Lives in: House/apartment Stairs: No Has following equipment at home: Single point cane, Environmental consultant - 2 wheeled, and Grab bars  OCCUPATION: retired  PLOF: Independent with household  mobility with device  PATIENT GOALS: Walk better.   NEXT MD  VISIT: none  OBJECTIVE:   DIAGNOSTIC FINDINGS:  CT Head Wo Contrast 02/26/2023   CLINICAL DATA:  Head trauma, moderate-severe; Neck trauma (Age >= 65y)   EXAM: CT HEAD WITHOUT CONTRAST   CT CERVICAL SPINE WITHOUT CONTRAST   TECHNIQUE: Multidetector CT imaging of the head and cervical spine was performed following the standard protocol without intravenous contrast. Multiplanar CT image reconstructions of the cervical spine were also generated.   RADIATION DOSE REDUCTION: This exam was performed according to the departmental dose-optimization program which includes automated exposure control, adjustment of the mA and/or kV according to patient size and/or use of iterative reconstruction technique.   COMPARISON:  CT Chest 07/24/22   FINDINGS: CT HEAD FINDINGS   Brain: No evidence of acute infarction, hemorrhage, hydrocephalus, extra-axial collection or mass lesion/mass effect. Most likely a chronic, but technically age indeterminate infarct in the right cerebellum. There is a chronic left parietal lobe infarct. Sequela of moderate chronic microvascular ischemic change.   Vascular: No hyperdense vessel or unexpected calcification.   Skull: Soft tissue swelling along the right frontal scalp. No evidence of an underlying calvarial fracture.   Sinuses/Orbits: No middle ear or mastoid effusion. Paranasal sinuses are clear. Right lens replacement. Orbits are otherwise unremarkable.   Other: None.   CT CERVICAL SPINE FINDINGS   Alignment: Trace anterolisthesis of C4 on C5.   Skull base and vertebrae: No acute cervical spine fracture. No primary bone lesion or focal pathologic process. There is an acute appearing superior endplate compression deformity at T2   Soft tissues and spinal canal: No prevertebral fluid or swelling. No visible canal hematoma.   Disc levels:  No evidence of high-grade spinal canal stenosis.   Upper chest: Negative   Other: There is a 1.7 cm  left thyroid nodule (series 4, image 94). Recommend further evaluation with dedicated thyroid ultrasound, if not previously performed.   IMPRESSION: 1. No acute intracranial abnormality. 2. Soft tissue swelling along the right frontal scalp. No evidence of an underlying calvarial fracture. 3. No acute cervical spine fracture. 4. Acute superior endplate compression deformity at T2. 5. There is a 1.7 cm left thyroid nodule. Recommend further evaluation with dedicated thyroid ultrasound, if not previously performed.     Electronically Signed   By: Lorenza Cambridge M.D.   On: 02/26/2023 09:53  PATIENT SURVEYS:    SCREENING FOR RED FLAGS: Bowel or bladder incontinence: No  Cauda equina syndrome: No   COGNITION: Overall cognitive status: Within functional limits for tasks assessed     SENSATION:     POSTURE:   PALPATION:   Cervical AROM:   AROM 05/19/2023  Flexion   Extension   Right lateral flexion   Left lateral flexion   Right rotation 40  Left rotation 35   (Blank rows = not tested)  LOWER EXTREMITY ROM:     Active  Right 05/19/2023 Left 05/19/2023  Hip flexion    Hip extension (seated manually resisted) 4 4-  Hip abduction (seated manually resisted, hips less than 90 degrees flexion) 4+ 4+  Hip adduction    Hip internal rotation    Hip external rotation    Knee flexion    Knee extension    Ankle dorsiflexion    Ankle plantarflexion    Ankle inversion    Ankle eversion     (Blank rows = not tested)   Very limited R and L cervical rotation AROM  as well as very limited cervical extension AROM observed    LOWER EXTREMITY MMT:    MMT Right eval Left eval  Hip flexion    Hip extension    Hip abduction    Hip adduction    Hip internal rotation    Hip external rotation    Knee flexion    Knee extension    Ankle dorsiflexion    Ankle plantarflexion    Ankle inversion    Ankle eversion     (Blank rows = not tested)  LUMBAR SPECIAL TESTS:     FUNCTIONAL TESTS:  5 times sit to stand: 21 seconds with B UE assist Timed up and go (TUG): 14.2 seconds without AD  Dynamic Gait Index: 15 with SPC on R side   TUG with SPC 17.21 seconds (first try)   Without SPC 15.61, 13.05, 13.96  seconds (14.2 seconds average without AD)   Observation: stand to sit, pt not in front of chair prior to sitting down, almost missed the chair when turning to sit after gait.   GAIT: Distance walked: 40 ft Assistive device utilized: Single point cane Level of assistance: CGA Comments: decreased stance R LE, SPC in R side      TODAY'S TREATMENT:                                                                                                                              DATE: 05/29/2023    Therapeutic exercise  Hooklying  B scapular retraction to promote thoracic extension and ability to turn his head 10x5 seconds   Chin tuck 5x 2-5 seconds for 2 sets. Difficulty with proper cervical and scapular mechanics .   B shoulder horizontal abduction (about 40 degrees abduction secondary to L shoulder discomfort) to promote thoracic extension 10x5 seconds    Standing B UE assist  With yellow band around ankles   Side stepping 10x to the R and L (5 ft each)   Hip extension with yellow band around ankles   R 10x3   L 10x3   Hip abduction   with yellow band around ankles   R 10x3   L 10x3   standing B shoulder extension with scapular retraction resisting yellow band to promote thoracic extension and decrease neck stiffness for head turns during gait.   10x5 seconds   Gait with R and L head turns 32 ft x 3  R LE unsteadiness with R cervical rotation observed    Improved exercise technique, movement at target joints, use of target muscles after mod verbal, visual, tactile cues.    Manual therapy  Seated STM R and L upper trap area to decrease fascial restrictions Seated STM L upper trap to decrease muscle tension       PATIENT  EDUCATION:  Education details: POC Person educated: Patient Education method: Explanation Education comprehension: verbalized understanding  HOME EXERCISE PROGRAM: Access Code: [email or web address] URL: [email or web address] Date: 05/19/2023 Prepared by:  [facility]  Exercises - Seated Scapular Retraction  - 2 x daily - 7 x weekly - 3 sets - 10 reps - 5 seconds hold  - Standing Hip Abduction with Resistance at Ankles and Counter Support  - 1 x daily - 7 x weekly - 3 sets - 10 reps  Yellow band    ASSESSMENT:  CLINICAL IMPRESSION:  Worked on improving thoracic extension as well as decreasing fascial restrictions and muscle tension to his neck to help improve ability to turn his head when walking. Demonstrates slight unsteadiness with R LE during gait with end range R cervical rotation. Continued working on glute med and max strength for single leg balance during obstacle negotiation.  Pt tolerated session well without aggravation of symptoms. Pt will benefit from continued skilled physical therapy services to improve strength, balance, and function.         OBJECTIVE IMPAIRMENTS: Abnormal gait, decreased activity tolerance, decreased balance, difficulty walking, decreased strength, improper body mechanics, and postural dysfunction.   ACTIVITY LIMITATIONS: carrying, lifting, standing, stairs, transfers, and locomotion level  PARTICIPATION LIMITATIONS:   PERSONAL FACTORS: Age, Fitness, Past/current experiences, and 3+ comorbidities: CA, CAD, HTN, MI, orthostaic hypotension, PAF  are also affecting patient's functional outcome.   REHAB POTENTIAL: Fair    CLINICAL DECISION MAKING: Stable/uncomplicated  EVALUATION COMPLEXITY: Low   GOALS: Goals reviewed with patient? Yes  SHORT TERM GOALS: Target date: 06/06/2023   Pt will be independent with his initial HEP to improve strength, balance, and function.  Baseline: Pt has not yet started his initial HEP  (05/14/2023) Goal status: INITIAL  LONG TERM GOALS: Target date: 07/11/2023  Pt will improve his 5 times sit to stand time to 12 seconds or less without UE assist as a demonstration of improved functional LE strength.  Baseline: 21 seconds with B UE assist (05/14/2023) Goal status: INITIAL  2.  Pt will improve his TUG time to 12 seconds or less without AD as a demonstration of improved functional mobility and balance. Baseline: Timed up and go (TUG): 14.2 seconds without AD (05/14/2023) Goal status: INITIAL  3.  Pt will improve his Dynamic Gait Index score to 19 or more without AD as a demonstration of improved balance.  Baseline: Dynamic Gait Index: 15 with SPC on R side (05/14/2023) Goal status: INITIAL  PLAN:  PT FREQUENCY: 2x/week  PT DURATION: 8 weeks  PLANNED INTERVENTIONS: Therapeutic exercises, Therapeutic activity, Neuromuscular re-education, Balance training, Gait training, Patient/Family education, Stair training, Vestibular training, Canalith repositioning, Dry Needling, Electrical stimulation, Manual therapy, and Re-evaluation.  PLAN FOR NEXT SESSION: Functional LE, glute med, max, quadriceps strengthening, balance, manual techniques, modalities PRN.   Loralyn Freshwater PT, DPT  05/29/2023, 3:27 PM

## 2023-05-30 ENCOUNTER — Ambulatory Visit: Payer: Medicare HMO | Attending: Cardiovascular Disease | Admitting: Cardiovascular Disease

## 2023-05-30 ENCOUNTER — Encounter: Payer: Self-pay | Admitting: Cardiovascular Disease

## 2023-05-30 VITALS — BP 124/80 | HR 80 | Ht 76.0 in | Wt 188.5 lb

## 2023-05-30 DIAGNOSIS — I480 Paroxysmal atrial fibrillation: Secondary | ICD-10-CM

## 2023-05-30 DIAGNOSIS — R0600 Dyspnea, unspecified: Secondary | ICD-10-CM | POA: Diagnosis not present

## 2023-05-30 DIAGNOSIS — I25118 Atherosclerotic heart disease of native coronary artery with other forms of angina pectoris: Secondary | ICD-10-CM | POA: Diagnosis not present

## 2023-05-30 NOTE — Patient Instructions (Signed)

## 2023-06-02 ENCOUNTER — Other Ambulatory Visit: Payer: Medicare HMO

## 2023-06-05 ENCOUNTER — Other Ambulatory Visit: Payer: Self-pay | Admitting: Urology

## 2023-06-06 ENCOUNTER — Ambulatory Visit: Payer: Medicare HMO | Admitting: Cardiovascular Disease

## 2023-06-12 ENCOUNTER — Other Ambulatory Visit: Payer: Self-pay | Admitting: Gastroenterology

## 2023-06-13 ENCOUNTER — Other Ambulatory Visit: Payer: Self-pay | Admitting: Cardiovascular Disease

## 2023-06-25 ENCOUNTER — Ambulatory Visit: Payer: Medicare HMO | Attending: Family Medicine

## 2023-06-25 DIAGNOSIS — R262 Difficulty in walking, not elsewhere classified: Secondary | ICD-10-CM | POA: Diagnosis present

## 2023-06-25 DIAGNOSIS — Z9181 History of falling: Secondary | ICD-10-CM | POA: Insufficient documentation

## 2023-06-25 DIAGNOSIS — R2681 Unsteadiness on feet: Secondary | ICD-10-CM | POA: Diagnosis present

## 2023-06-25 NOTE — Therapy (Signed)
OUTPATIENT PHYSICAL THERAPY TREATMENT   Patient Name: William Taylor MRN: [id number] DOB:1930-03-21, 89 y.o., male Today's Date: 06/25/2023  END OF SESSION:  PT End of Session - 06/25/23 1030     Visit Number 6    Number of Visits 17    Date for PT Re-Evaluation 07/11/23    PT Start Time 1030    PT Stop Time 1115    PT Time Calculation (min) 45 min    Equipment Utilized During Treatment Gait belt;Other (comment)   SPC   Activity Tolerance Patient tolerated treatment well    Behavior During Therapy WFL for tasks assessed/performed                 Past Medical History:  Diagnosis Date   Cancer [facility])    Prostate, followed by Dr. Achilles Dunk   Colon polyp    Coronary artery disease    a. 2005 s/p PCI LAD ([facility]); b 2012 s/p PCI OM2; c.10/2013 Cath: LAD 56m ISR, 71m, D1 90, D2 70, OM2 80 w/ patent stent;  d. 06/2019 Low risk MV; e. 07/2020 MV: EF 77%, no ischemia/infarct.   Gall stones    Hyperlipidemia    Hypertension    MI (myocardial infarction) (HCC)    2015   Orthostatic hypotension    a. prev on midodrine/florinef/northera.   PAF (paroxysmal atrial fibrillation) (HCC)    a. on xarelto and amio (CHA2DS2VASc = 4).   Skin cancer    Past Surgical History:  Procedure Laterality Date   CARDIAC CATHETERIZATION  10/17/2013   [facility]   CATARACT EXTRACTION  05/22/2011   right eye   colonoscopy     COLONOSCOPY W/ POLYPECTOMY  08/19/2013   Dr Shelle Iron   COLONOSCOPY WITH PROPOFOL N/A 08/15/2016   Procedure: COLONOSCOPY WITH PROPOFOL;  Surgeon: Wyline Mood, MD;  Location: [facility] ENDOSCOPY;  Service: Endoscopy;  Laterality: N/A;   CORONARY ANGIOPLASTY  08/20/2007   2005; s/p stent   ENTEROSCOPY N/A 09/22/2018   Procedure: ENTEROSCOPY;  Surgeon: Toney Reil, MD;  Location[facility] ENDOSCOPY;  Service: Gastroenterology;  Laterality: N/A;   ESOPHAGOGASTRODUODENOSCOPY (EGD) WITH PROPOFOL N/A 08/15/2016   Procedure: ESOPHAGOGASTRODUODENOSCOPY (EGD) WITH PROPOFOL;  Surgeon: Wyline Mood, MD;  Location: [facility] ENDOSCOPY;  Service: Endoscopy;  Laterality: N/A;   GIVENS CAPSULE STUDY N/A 09/26/2016   Procedure: GIVENS CAPSULE STUDY;  Surgeon: Wyline Mood, MD;  Location: [facility] ENDOSCOPY;  Service: Endoscopy;  Laterality: N/A;   HERNIA REPAIR  08/20/2011   LEFT HEART CATH AND CORONARY ANGIOGRAPHY N/A 04/29/2022   Procedure: LEFT HEART CATH AND CORONARY ANGIOGRAPHY;  Surgeon: Iran Ouch, MD;  Location: [facility] INVASIVE CV LAB;  Service: Cardiovascular;  Laterality: N/A;   LUMBAR SPINE SURGERY     x2 in [place] in 1990's   UPPER GI ENDOSCOPY  04/19/2014   Dr Shelle Iron   Patient Active Problem List   Diagnosis Date Noted   Balance problem 04/30/2023   Compression fracture of T2 vertebra (HCC) 03/05/2023   Cerebral infarction, chronic 03/05/2023   Thyroid nodule 03/05/2023   Chronic constipation 08/06/2022   PVC's (premature ventricular contractions) 05/07/2022   Neuropathic pain 04/04/2022   Spondylolisthesis, lumbar region 03/14/2022   Hypophosphatemia 09/02/2021   Allergic rhinitis 08/30/2020   Coronary artery disease    GERD (gastroesophageal reflux disease)    Benign prostatic hyperplasia with lower urinary tract symptoms 08/13/2020   Fall 11/11/2019   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   Osteoarthritis of knees, bilateral 08/31/2019   History of  GI bleed    Orthostatic hypotension 06/06/2018   Chronic pain of left knee 06/05/2018   Chronic back pain 02/17/2018   Angiodysplasia of intestinal tract    Iron deficiency anemia    Benign neoplasm of ascending colon    Diverticulosis of large intestine without diverticulitis    Gastric polyp    Left rotator cuff tear arthropathy 09/11/2015   Fatigue 04/15/2014   Prostate cancer (HCC) 03/15/2013   DOE (dyspnea on exertion) 11/23/2012   Insomnia 09/03/2012   Hypertension 07/23/2012   CKD (chronic kidney disease), stage IIIa 07/23/2011   History of pulmonary embolism 01/09/2011   Paroxysmal atrial fibrillation  (HCC) 09/06/2010   Mixed hyperlipidemia 05/19/2009    PCP: Glori Luis, MD  REFERRING PROVIDER: Glori Luis, MD  REFERRING DIAG: R26.89 (ICD-10-CM) - Balance problem  Rationale for Evaluation and Treatment: Rehabilitation  THERAPY DIAG:  Unsteadiness on feet  Difficulty in walking, not elsewhere classified  History of falling  ONSET DATE: 2-3 months ago  SUBJECTIVE:                                                                                                                                                                                           SUBJECTIVE STATEMENT: Pt reports he woke up this morning and just didn't feel good.  Not sick or anything just didn't feel great.  No falls since last seen and denies pain.   PERTINENT HISTORY:  Balance. Has been using a SPC for about 8-9 months and a rw at night for about 5-6 months. Has had difficulty with his balance about 2-3 months ago and has not improved. Had 2 back operations years ago. Took a bad fall many years ago and screwed up both knees. Went to the ER about month ago and had chest and upper back pain with sweating. Has happened several times before. Has had 2 stents and one heart attack. Can have CPR performed if needed.   Feels like losing his balance when he turns while talking or doing something.   No latex allergies  Blood pressure is pretty much controlled per pt.     PAIN:  Are you having pain? No  PRECAUTIONS: Fall, Has nitroglycerin for his heart  RED FLAGS: Bowel or bladder incontinence: No and Cauda equina syndrome: No   WEIGHT BEARING RESTRICTIONS: No  FALLS:  Has patient fallen in last 6 months? Yes. Number of falls 1  At night. Uses a walker to go to the bathroom and his R foot got caught at the edge of the walker.   LIVING ENVIRONMENT: Lives with: lives with their spouse Lives  in: House/apartment Stairs: No Has following equipment at home: Single point cane, Walker - 2 wheeled,  and Grab bars  OCCUPATION: retired  PLOF: Independent with household mobility with device  PATIENT GOALS: Walk better.   NEXT MD VISIT: none  OBJECTIVE:   DIAGNOSTIC FINDINGS:  CT Head Wo Contrast 02/26/2023   CLINICAL DATA:  Head trauma, moderate-severe; Neck trauma (Age >= 65y)   EXAM: CT HEAD WITHOUT CONTRAST   CT CERVICAL SPINE WITHOUT CONTRAST   TECHNIQUE: Multidetector CT imaging of the head and cervical spine was performed following the standard protocol without intravenous contrast. Multiplanar CT image reconstructions of the cervical spine were also generated.   RADIATION DOSE REDUCTION: This exam was performed according to the departmental dose-optimization program which includes automated exposure control, adjustment of the mA and/or kV according to patient size and/or use of iterative reconstruction technique.   COMPARISON:  CT Chest 07/24/22   FINDINGS: CT HEAD FINDINGS   Brain: No evidence of acute infarction, hemorrhage, hydrocephalus, extra-axial collection or mass lesion/mass effect. Most likely a chronic, but technically age indeterminate infarct in the right cerebellum. There is a chronic left parietal lobe infarct. Sequela of moderate chronic microvascular ischemic change.   Vascular: No hyperdense vessel or unexpected calcification.   Skull: Soft tissue swelling along the right frontal scalp. No evidence of an underlying calvarial fracture.   Sinuses/Orbits: No middle ear or mastoid effusion. Paranasal sinuses are clear. Right lens replacement. Orbits are otherwise unremarkable.   Other: None.   CT CERVICAL SPINE FINDINGS   Alignment: Trace anterolisthesis of C4 on C5.   Skull base and vertebrae: No acute cervical spine fracture. No primary bone lesion or focal pathologic process. There is an acute appearing superior endplate compression deformity at T2   Soft tissues and spinal canal: No prevertebral fluid or swelling. No visible  canal hematoma.   Disc levels:  No evidence of high-grade spinal canal stenosis.   Upper chest: Negative   Other: There is a 1.7 cm left thyroid nodule (series 4, image 94). Recommend further evaluation with dedicated thyroid ultrasound, if not previously performed.   IMPRESSION: 1. No acute intracranial abnormality. 2. Soft tissue swelling along the right frontal scalp. No evidence of an underlying calvarial fracture. 3. No acute cervical spine fracture. 4. Acute superior endplate compression deformity at T2. 5. There is a 1.7 cm left thyroid nodule. Recommend further evaluation with dedicated thyroid ultrasound, if not previously performed.     Electronically Signed   By: Lorenza Cambridge M.D.   On: 02/26/2023 09:53  PATIENT SURVEYS:    SCREENING FOR RED FLAGS: Bowel or bladder incontinence: No  Cauda equina syndrome: No   COGNITION: Overall cognitive status: Within functional limits for tasks assessed     SENSATION:     POSTURE:   PALPATION:   Cervical AROM:   AROM 05/19/2023  Flexion   Extension   Right lateral flexion   Left lateral flexion   Right rotation 40  Left rotation 35   (Blank rows = not tested)  LOWER EXTREMITY ROM:     Active  Right 05/19/2023 Left 05/19/2023  Hip flexion    Hip extension (seated manually resisted) 4 4-  Hip abduction (seated manually resisted, hips less than 90 degrees flexion) 4+ 4+  Hip adduction    Hip internal rotation    Hip external rotation    Knee flexion    Knee extension    Ankle dorsiflexion    Ankle plantarflexion  Ankle inversion    Ankle eversion     (Blank rows = not tested)   Very limited R and L cervical rotation AROM as well as very limited cervical extension AROM observed    LOWER EXTREMITY MMT:    MMT Right eval Left eval  Hip flexion    Hip extension    Hip abduction    Hip adduction    Hip internal rotation    Hip external rotation    Knee flexion    Knee extension     Ankle dorsiflexion    Ankle plantarflexion    Ankle inversion    Ankle eversion     (Blank rows = not tested)  LUMBAR SPECIAL TESTS:    FUNCTIONAL TESTS:  5 times sit to stand: 21 seconds with B UE assist Timed up and go (TUG): 14.2 seconds without AD  Dynamic Gait Index: 15 with SPC on R side   TUG with SPC 17.21 seconds (first try)   Without SPC 15.61, 13.05, 13.96  seconds (14.2 seconds average without AD)   Observation: stand to sit, pt not in front of chair prior to sitting down, almost missed the chair when turning to sit after gait.   GAIT: Distance walked: 40 ft Assistive device utilized: Single point cane Level of assistance: CGA Comments: decreased stance R LE, SPC in R side      TODAY'S TREATMENT: DATE: 06/25/23   Therapeutic exercise  Standing squats with UE support on the treadmill, 2x10  Pt given verbal and visual demonstration of proper form Seated hamstring curls with GTB, 2x10 each LE Seated hip adduction into green physioball, 3 sec holds, 2x10   Neuro:  Static stance on Airex pad, 30 sec bouts Static stance on Airex pad, eyes closed 30 sec bouts Static NBOS on Airex pad, 30 sec bouts Static NBOS on Airex pad, eyes closed, 30 sec bouts Lateral stepping on airex balance beam, x4 down and back Forward ambulation on airex balance beam, x2 each direction  Ambulation outside across multiple surfaces including mulch, pavement, grass, and curbs, x8 minutes Ambulation up/down incline at realtor office, x3 total attempts   Improved exercise technique, movement at target joints, use of target muscles after mod verbal, visual, tactile cues.     PATIENT EDUCATION:  Education details: POC Person educated: Patient Education method: Explanation Education comprehension: verbalized understanding  HOME EXERCISE PROGRAM: Access Code: [email or web address] URL: [email or web address] Date: 05/19/2023 Prepared by: Loralyn Freshwater  Exercises -  Seated Scapular Retraction  - 2 x daily - 7 x weekly - 3 sets - 10 reps - 5 seconds hold  - Standing Hip Abduction with Resistance at Ankles and Counter Support  - 1 x daily - 7 x weekly - 3 sets - 10 reps  Yellow band    ASSESSMENT:  CLINICAL IMPRESSION:  Pt performed well with the balance training and put forth great effort throughout the session.  Pt notably fatigued at the conclusion of the session, stating that he worked his LE's today.  Pt did have lack of confidence in ability to ambulate up curb, however was able to perform.  Pt did well with the incline as well, with ability to perform without UE support.   Pt will continue to benefit from skilled therapy to address remaining deficits in order to improve overall QoL and return to PLOF.          OBJECTIVE IMPAIRMENTS: Abnormal gait, decreased activity tolerance, decreased balance, difficulty walking, decreased strength, improper body mechanics,  and postural dysfunction.   ACTIVITY LIMITATIONS: carrying, lifting, standing, stairs, transfers, and locomotion level  PARTICIPATION LIMITATIONS:   PERSONAL FACTORS: Age, Fitness, Past/current experiences, and 3+ comorbidities: CA, CAD, HTN, MI, orthostaic hypotension, PAF  are also affecting patient's functional outcome.   REHAB POTENTIAL: Fair    CLINICAL DECISION MAKING: Stable/uncomplicated  EVALUATION COMPLEXITY: Low   GOALS: Goals reviewed with patient? Yes  SHORT TERM GOALS: Target date: 06/06/2023   Pt will be independent with his initial HEP to improve strength, balance, and function.  Baseline: Pt has not yet started his initial HEP (05/14/2023) Goal status: INITIAL  LONG TERM GOALS: Target date: 07/11/2023  Pt will improve his 5 times sit to stand time to 12 seconds or less without UE assist as a demonstration of improved functional LE strength.  Baseline: 21 seconds with B UE assist (05/14/2023) Goal status: INITIAL  2.  Pt will improve his TUG time to 12  seconds or less without AD as a demonstration of improved functional mobility and balance. Baseline: Timed up and go (TUG): 14.2 seconds without AD (05/14/2023) Goal status: INITIAL  3.  Pt will improve his Dynamic Gait Index score to 19 or more without AD as a demonstration of improved balance.  Baseline: Dynamic Gait Index: 15 with SPC on R side (05/14/2023) Goal status: INITIAL  PLAN:  PT FREQUENCY: 2x/week  PT DURATION: 8 weeks  PLANNED INTERVENTIONS: Therapeutic exercises, Therapeutic activity, Neuromuscular re-education, Balance training, Gait training, Patient/Family education, Stair training, Vestibular training, Canalith repositioning, Dry Needling, Electrical stimulation, Manual therapy, and Re-evaluation.  PLAN FOR NEXT SESSION: Functional LE, glute med, max, quadriceps strengthening, balance, manual techniques, modalities PRN.    Nolon Bussing, PT, DPT Physical Therapist - [facility]  06/25/23, 12:30 PM

## 2023-06-27 ENCOUNTER — Telehealth: Payer: Self-pay | Admitting: Family Medicine

## 2023-06-27 ENCOUNTER — Ambulatory Visit: Payer: Medicare HMO

## 2023-06-27 DIAGNOSIS — Z9181 History of falling: Secondary | ICD-10-CM

## 2023-06-27 DIAGNOSIS — R2681 Unsteadiness on feet: Secondary | ICD-10-CM | POA: Diagnosis not present

## 2023-06-27 DIAGNOSIS — R262 Difficulty in walking, not elsewhere classified: Secondary | ICD-10-CM

## 2023-06-27 NOTE — Telephone Encounter (Signed)
Pt's wife, Jasmine December, called asking if Dr. Para March would accept pt as a new pt? Jasmine December stated the pt's best friend, Fayrene Fearing "Harvie Heck" Manson Passey is a current pt of Dr. Para March & is also who recommended Para March to them. Jasmine December states pt is a current pt of Dr. Antony Haste at [facility] but he's leaving the office soon. Call back # [phone]

## 2023-06-27 NOTE — Therapy (Signed)
OUTPATIENT PHYSICAL THERAPY TREATMENT   Patient Name: William Taylor MRN: [id number] DOB:Nov 28, 1929, 89 y.o., male Today's Date: 06/27/2023  END OF SESSION:  PT End of Session - 06/27/23 0900     Visit Number 7    Number of Visits 17    Date for PT Re-Evaluation 07/11/23    PT Start Time 0900    PT Stop Time 0945    PT Time Calculation (min) 45 min    Equipment Utilized During Treatment Gait belt;Other (comment)   SPC   Activity Tolerance Patient tolerated treatment well    Behavior During Therapy WFL for tasks assessed/performed                 Past Medical History:  Diagnosis Date   Cancer [facility])    Prostate, followed by Dr. Achilles Dunk   Colon polyp    Coronary artery disease    a. 2005 s/p PCI LAD ([facility]); b 2012 s/p PCI OM2; c.10/2013 Cath: LAD 23m ISR, 63m, D1 90, D2 70, OM2 80 w/ patent stent;  d. 06/2019 Low risk MV; e. 07/2020 MV: EF 77%, no ischemia/infarct.   Gall stones    Hyperlipidemia    Hypertension    MI (myocardial infarction) (HCC)    2015   Orthostatic hypotension    a. prev on midodrine/florinef/northera.   PAF (paroxysmal atrial fibrillation) (HCC)    a. on xarelto and amio (CHA2DS2VASc = 4).   Skin cancer    Past Surgical History:  Procedure Laterality Date   CARDIAC CATHETERIZATION  10/17/2013   [facility]   CATARACT EXTRACTION  05/22/2011   right eye   colonoscopy     COLONOSCOPY W/ POLYPECTOMY  08/19/2013   Dr Shelle Iron   COLONOSCOPY WITH PROPOFOL N/A 08/15/2016   Procedure: COLONOSCOPY WITH PROPOFOL;  Surgeon: Wyline Mood, MD;  Location: [facility] ENDOSCOPY;  Service: Endoscopy;  Laterality: N/A;   CORONARY ANGIOPLASTY  08/20/2007   2005; s/p stent   ENTEROSCOPY N/A 09/22/2018   Procedure: ENTEROSCOPY;  Surgeon: Toney Reil, MD;  Location: [facility] ENDOSCOPY;  Service: Gastroenterology;  Laterality: N/A;   ESOPHAGOGASTRODUODENOSCOPY (EGD) WITH PROPOFOL N/A 08/15/2016   Procedure: ESOPHAGOGASTRODUODENOSCOPY (EGD) WITH PROPOFOL;  Surgeon: Wyline Mood, MD;  Location: [facility] ENDOSCOPY;  Service: Endoscopy;  Laterality: N/A;   GIVENS CAPSULE STUDY N/A 09/26/2016   Procedure: GIVENS CAPSULE STUDY;  Surgeon: Wyline Mood, MD;  Location: [facility] ENDOSCOPY;  Service: Endoscopy;  Laterality: N/A;   HERNIA REPAIR  08/20/2011   LEFT HEART CATH AND CORONARY ANGIOGRAPHY N/A 04/29/2022   Procedure: LEFT HEART CATH AND CORONARY ANGIOGRAPHY;  Surgeon: Iran Ouch, MD;  Location: [facility] INVASIVE CV LAB;  Service: Cardiovascular;  Laterality: N/A;   LUMBAR SPINE SURGERY     x2 in [place] in 1990's   UPPER GI ENDOSCOPY  04/19/2014   Dr Shelle Iron   Patient Active Problem List   Diagnosis Date Noted   Balance problem 04/30/2023   Compression fracture of T2 vertebra (HCC) 03/05/2023   Cerebral infarction, chronic 03/05/2023   Thyroid nodule 03/05/2023   Chronic constipation 08/06/2022   PVC's (premature ventricular contractions) 05/07/2022   Neuropathic pain 04/04/2022   Spondylolisthesis, lumbar region 03/14/2022   Hypophosphatemia 09/02/2021   Allergic rhinitis 08/30/2020   Coronary artery disease    GERD (gastroesophageal reflux disease)    Benign prostatic hyperplasia with lower urinary tract symptoms 08/13/2020   Fall 11/11/2019   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   Osteoarthritis of knees, bilateral 08/31/2019   History of  GI bleed    Orthostatic hypotension 06/06/2018   Chronic pain of left knee 06/05/2018   Chronic back pain 02/17/2018   Angiodysplasia of intestinal tract    Iron deficiency anemia    Benign neoplasm of ascending colon    Diverticulosis of large intestine without diverticulitis    Gastric polyp    Left rotator cuff tear arthropathy 09/11/2015   Fatigue 04/15/2014   Prostate cancer (HCC) 03/15/2013   DOE (dyspnea on exertion) 11/23/2012   Insomnia 09/03/2012   Hypertension 07/23/2012   CKD (chronic kidney disease), stage IIIa 07/23/2011   History of pulmonary embolism 01/09/2011   Paroxysmal atrial fibrillation  (HCC) 09/06/2010   Mixed hyperlipidemia 05/19/2009    PCP: Glori Luis, MD  REFERRING PROVIDER: Glori Luis, MD  REFERRING DIAG: R26.89 (ICD-10-CM) - Balance problem  Rationale for Evaluation and Treatment: Rehabilitation  THERAPY DIAG:  Unsteadiness on feet  Difficulty in walking, not elsewhere classified  History of falling  ONSET DATE: 2-3 months ago  SUBJECTIVE:                                                                                                                                                                                           SUBJECTIVE STATEMENT:  Pt reports that he felt like he had done something after last session, but is doing well overall.  Pt denies any pain or falls since last visit.   PERTINENT HISTORY:  Balance. Has been using a SPC for about 8-9 months and a rw at night for about 5-6 months. Has had difficulty with his balance about 2-3 months ago and has not improved. Had 2 back operations years ago. Took a bad fall many years ago and screwed up both knees. Went to the ER about month ago and had chest and upper back pain with sweating. Has happened several times before. Has had 2 stents and one heart attack. Can have CPR performed if needed.   Feels like losing his balance when he turns while talking or doing something.   No latex allergies  Blood pressure is pretty much controlled per pt.     PAIN:  Are you having pain? No  PRECAUTIONS: Fall, Has nitroglycerin for his heart  RED FLAGS: Bowel or bladder incontinence: No and Cauda equina syndrome: No   WEIGHT BEARING RESTRICTIONS: No  FALLS:  Has patient fallen in last 6 months? Yes. Number of falls 1  At night. Uses a walker to go to the bathroom and his R foot got caught at the edge of the walker.   LIVING ENVIRONMENT: Lives with: lives with their spouse Lives in:  House/apartment Stairs: No Has following equipment at home: Single point cane, Walker - 2 wheeled, and  Grab bars  OCCUPATION: retired  PLOF: Independent with household mobility with device  PATIENT GOALS: Walk better.   NEXT MD VISIT: none  OBJECTIVE:   DIAGNOSTIC FINDINGS:  CT Head Wo Contrast 02/26/2023   CLINICAL DATA:  Head trauma, moderate-severe; Neck trauma (Age >= 65y)   EXAM: CT HEAD WITHOUT CONTRAST   CT CERVICAL SPINE WITHOUT CONTRAST   TECHNIQUE: Multidetector CT imaging of the head and cervical spine was performed following the standard protocol without intravenous contrast. Multiplanar CT image reconstructions of the cervical spine were also generated.   RADIATION DOSE REDUCTION: This exam was performed according to the departmental dose-optimization program which includes automated exposure control, adjustment of the mA and/or kV according to patient size and/or use of iterative reconstruction technique.   COMPARISON:  CT Chest 07/24/22   FINDINGS: CT HEAD FINDINGS   Brain: No evidence of acute infarction, hemorrhage, hydrocephalus, extra-axial collection or mass lesion/mass effect. Most likely a chronic, but technically age indeterminate infarct in the right cerebellum. There is a chronic left parietal lobe infarct. Sequela of moderate chronic microvascular ischemic change.   Vascular: No hyperdense vessel or unexpected calcification.   Skull: Soft tissue swelling along the right frontal scalp. No evidence of an underlying calvarial fracture.   Sinuses/Orbits: No middle ear or mastoid effusion. Paranasal sinuses are clear. Right lens replacement. Orbits are otherwise unremarkable.   Other: None.   CT CERVICAL SPINE FINDINGS   Alignment: Trace anterolisthesis of C4 on C5.   Skull base and vertebrae: No acute cervical spine fracture. No primary bone lesion or focal pathologic process. There is an acute appearing superior endplate compression deformity at T2   Soft tissues and spinal canal: No prevertebral fluid or swelling. No visible canal  hematoma.   Disc levels:  No evidence of high-grade spinal canal stenosis.   Upper chest: Negative   Other: There is a 1.7 cm left thyroid nodule (series 4, image 94). Recommend further evaluation with dedicated thyroid ultrasound, if not previously performed.   IMPRESSION: 1. No acute intracranial abnormality. 2. Soft tissue swelling along the right frontal scalp. No evidence of an underlying calvarial fracture. 3. No acute cervical spine fracture. 4. Acute superior endplate compression deformity at T2. 5. There is a 1.7 cm left thyroid nodule. Recommend further evaluation with dedicated thyroid ultrasound, if not previously performed.     Electronically Signed   By: Lorenza Cambridge M.D.   On: 02/26/2023 09:53  PATIENT SURVEYS:    SCREENING FOR RED FLAGS: Bowel or bladder incontinence: No  Cauda equina syndrome: No   COGNITION: Overall cognitive status: Within functional limits for tasks assessed     SENSATION:     POSTURE:   PALPATION:   Cervical AROM:   AROM 05/19/2023  Flexion   Extension   Right lateral flexion   Left lateral flexion   Right rotation 40  Left rotation 35   (Blank rows = not tested)  LOWER EXTREMITY ROM:     Active  Right 05/19/2023 Left 05/19/2023  Hip flexion    Hip extension (seated manually resisted) 4 4-  Hip abduction (seated manually resisted, hips less than 90 degrees flexion) 4+ 4+  Hip adduction    Hip internal rotation    Hip external rotation    Knee flexion    Knee extension    Ankle dorsiflexion    Ankle plantarflexion  Ankle inversion    Ankle eversion     (Blank rows = not tested)   Very limited R and L cervical rotation AROM as well as very limited cervical extension AROM observed    LOWER EXTREMITY MMT:    MMT Right eval Left eval  Hip flexion    Hip extension    Hip abduction    Hip adduction    Hip internal rotation    Hip external rotation    Knee flexion    Knee extension    Ankle  dorsiflexion    Ankle plantarflexion    Ankle inversion    Ankle eversion     (Blank rows = not tested)  LUMBAR SPECIAL TESTS:    FUNCTIONAL TESTS:  5 times sit to stand: 21 seconds with B UE assist Timed up and go (TUG): 14.2 seconds without AD  Dynamic Gait Index: 15 with SPC on R side   TUG with SPC 17.21 seconds (first try)   Without SPC 15.61, 13.05, 13.96  seconds (14.2 seconds average without AD)   Observation: stand to sit, pt not in front of chair prior to sitting down, almost missed the chair when turning to sit after gait.   GAIT: Distance walked: 40 ft Assistive device utilized: Single point cane Level of assistance: CGA Comments: decreased stance R LE, SPC in R side      TODAY'S TREATMENT: DATE: 06/27/23   TherEx:  Standing squats with UE support on the treadmill, 2x10  Pt given verbal and visual demonstration of proper form Seated hamstring curls with GTB, 2x10 each LE Seated hip adduction into green physioball, 3 sec holds, 2x10 Seated hip abduction with green theraband, 2x10 Seated leg press at OMEGA, 45#, 2x10 Sled push, no weight added, length of room x2 Sled push with 25# weight added, length of room, x2    Neuro:  High knee marches length of room, x2  Backwards walking length of room, x2 Lateral walking length of room, x2  Ambulation length of hallway with head turns, x2 Ambulation length of hallway with head nods, x2   Improved exercise technique, movement at target joints, use of target muscles after mod verbal, visual, tactile cues.     PATIENT EDUCATION:  Education details: POC Person educated: Patient Education method: Explanation Education comprehension: verbalized understanding  HOME EXERCISE PROGRAM: Access Code: [email or web address] URL: [email or web address] Date: 05/19/2023 Prepared by: Loralyn Freshwater  Exercises - Seated Scapular Retraction  - 2 x daily - 7 x weekly - 3 sets - 10 reps - 5 seconds hold  -  Standing Hip Abduction with Resistance at Ankles and Counter Support  - 1 x daily - 7 x weekly - 3 sets - 10 reps  Yellow band    ASSESSMENT:  CLINICAL IMPRESSION:  Pt responded well to the balance exercises as well as the strengthening tasks given as part of plan.  Pt noted to have some mild instability that pt is able to recognize and self-correct when performing ambulation with head turns/nods.  Pt most notably off balance when performing SLS on the L LE.  Pt would continue to benefit from strengthening and challenging the L LE in order to improve overall balance as therapy progresses.   Pt will continue to benefit from skilled therapy to address remaining deficits in order to improve overall QoL and return to PLOF.         OBJECTIVE IMPAIRMENTS: Abnormal gait, decreased activity tolerance, decreased balance, difficulty walking, decreased strength, improper body mechanics,  and postural dysfunction.   ACTIVITY LIMITATIONS: carrying, lifting, standing, stairs, transfers, and locomotion level  PARTICIPATION LIMITATIONS:   PERSONAL FACTORS: Age, Fitness, Past/current experiences, and 3+ comorbidities: CA, CAD, HTN, MI, orthostaic hypotension, PAF  are also affecting patient's functional outcome.   REHAB POTENTIAL: Fair    CLINICAL DECISION MAKING: Stable/uncomplicated  EVALUATION COMPLEXITY: Low   GOALS: Goals reviewed with patient? Yes  SHORT TERM GOALS: Target date: 06/06/2023   Pt will be independent with his initial HEP to improve strength, balance, and function.  Baseline: Pt has not yet started his initial HEP (05/14/2023) Goal status: INITIAL  LONG TERM GOALS: Target date: 07/11/2023  Pt will improve his 5 times sit to stand time to 12 seconds or less without UE assist as a demonstration of improved functional LE strength.  Baseline: 21 seconds with B UE assist (05/14/2023) Goal status: INITIAL  2.  Pt will improve his TUG time to 12 seconds or less without AD as a  demonstration of improved functional mobility and balance. Baseline: Timed up and go (TUG): 14.2 seconds without AD (05/14/2023) Goal status: INITIAL  3.  Pt will improve his Dynamic Gait Index score to 19 or more without AD as a demonstration of improved balance.  Baseline: Dynamic Gait Index: 15 with SPC on R side (05/14/2023) Goal status: INITIAL  PLAN:  PT FREQUENCY: 2x/week  PT DURATION: 8 weeks  PLANNED INTERVENTIONS: Therapeutic exercises, Therapeutic activity, Neuromuscular re-education, Balance training, Gait training, Patient/Family education, Stair training, Vestibular training, Canalith repositioning, Dry Needling, Electrical stimulation, Manual therapy, and Re-evaluation.  PLAN FOR NEXT SESSION: Functional LE, glute med, max, quadriceps strengthening, balance, manual techniques, modalities PRN.    Nolon Bussing, PT, DPT Physical Therapist - [facility]  06/27/23, 10:45 AM

## 2023-06-29 NOTE — Telephone Encounter (Signed)
Yes, please schedule a transition OV.  Given the complexity of his situation, I want to go through his records and d/w at OV.  Thanks.

## 2023-06-30 ENCOUNTER — Ambulatory Visit: Payer: Medicare HMO

## 2023-06-30 DIAGNOSIS — R2681 Unsteadiness on feet: Secondary | ICD-10-CM | POA: Diagnosis not present

## 2023-06-30 DIAGNOSIS — R262 Difficulty in walking, not elsewhere classified: Secondary | ICD-10-CM

## 2023-06-30 DIAGNOSIS — Z9181 History of falling: Secondary | ICD-10-CM

## 2023-06-30 NOTE — Therapy (Signed)
OUTPATIENT PHYSICAL THERAPY TREATMENT   Patient Name: William Taylor MRN: [id number] DOB:May 23, 1930, 89 y.o., male Today's Date: 06/30/2023  END OF SESSION:  PT End of Session - 06/30/23 0908     Visit Number 8    Number of Visits 17    Date for PT Re-Evaluation 07/11/23    Authorization Type [employer]    Progress Note Due on Visit 10    PT Start Time 0900    PT Stop Time 0940    PT Time Calculation (min) 40 min    Equipment Utilized During Treatment Gait belt    Activity Tolerance Patient tolerated treatment well    Behavior During Therapy WFL for tasks assessed/performed                 Past Medical History:  Diagnosis Date   Cancer (HCC)    Prostate, followed by Dr. Achilles Dunk   Colon polyp    Coronary artery disease    a. 2005 s/p PCI LAD ([facility]); b 2012 s/p PCI OM2; c.10/2013 Cath: LAD 59m ISR, 72m, D1 90, D2 70, OM2 80 w/ patent stent;  d. 06/2019 Low risk MV; e. 07/2020 MV: EF 77%, no ischemia/infarct.   Gall stones    Hyperlipidemia    Hypertension    MI (myocardial infarction) (HCC)    2015   Orthostatic hypotension    a. prev on midodrine/florinef/northera.   PAF (paroxysmal atrial fibrillation) (HCC)    a. on xarelto and amio (CHA2DS2VASc = 4).   Skin cancer    Past Surgical History:  Procedure Laterality Date   CARDIAC CATHETERIZATION  10/17/2013   [facility]   CATARACT EXTRACTION  05/22/2011   right eye   colonoscopy     COLONOSCOPY W/ POLYPECTOMY  08/19/2013   Dr Shelle Iron   COLONOSCOPY WITH PROPOFOL N/A 08/15/2016   Procedure: COLONOSCOPY WITH PROPOFOL;  Surgeon: Wyline Mood, MD;  Location: [facility] ENDOSCOPY;  Service: Endoscopy;  Laterality: N/A;   CORONARY ANGIOPLASTY  08/20/2007   2005; s/p stent   ENTEROSCOPY N/A 09/22/2018   Procedure: ENTEROSCOPY;  Surgeon: Toney Reil, MD;  Location[facility] ENDOSCOPY;  Service: Gastroenterology;  Laterality: N/A;   ESOPHAGOGASTRODUODENOSCOPY (EGD) WITH PROPOFOL N/A 08/15/2016   Procedure:  ESOPHAGOGASTRODUODENOSCOPY (EGD) WITH PROPOFOL;  Surgeon: Wyline Mood, MD;  Location: [facility] ENDOSCOPY;  Service: Endoscopy;  Laterality: N/A;   GIVENS CAPSULE STUDY N/A 09/26/2016   Procedure: GIVENS CAPSULE STUDY;  Surgeon: Wyline Mood, MD;  Location: [facility] ENDOSCOPY;  Service: Endoscopy;  Laterality: N/A;   HERNIA REPAIR  08/20/2011   LEFT HEART CATH AND CORONARY ANGIOGRAPHY N/A 04/29/2022   Procedure: LEFT HEART CATH AND CORONARY ANGIOGRAPHY;  Surgeon: Iran Ouch, MD;  Location: [facility] INVASIVE CV LAB;  Service: Cardiovascular;  Laterality: N/A;   LUMBAR SPINE SURGERY     x2 in [place] in 1990's   UPPER GI ENDOSCOPY  04/19/2014   Dr Shelle Iron   Patient Active Problem List   Diagnosis Date Noted   Balance problem 04/30/2023   Compression fracture of T2 vertebra (HCC) 03/05/2023   Cerebral infarction, chronic 03/05/2023   Thyroid nodule 03/05/2023   Chronic constipation 08/06/2022   PVC's (premature ventricular contractions) 05/07/2022   Neuropathic pain 04/04/2022   Spondylolisthesis, lumbar region 03/14/2022   Hypophosphatemia 09/02/2021   Allergic rhinitis 08/30/2020   Coronary artery disease    GERD (gastroesophageal reflux disease)    Benign prostatic hyperplasia with lower urinary tract symptoms 08/13/2020   Fall 11/11/2019   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   Osteoarthritis of knees, bilateral 08/31/2019   History of GI bleed    Orthostatic hypotension 06/06/2018   Chronic pain of left knee 06/05/2018   Chronic back pain 02/17/2018   Angiodysplasia of intestinal tract    Iron deficiency anemia    Benign neoplasm of ascending colon    Diverticulosis of large intestine without diverticulitis    Gastric polyp    Left rotator cuff tear arthropathy 09/11/2015   Fatigue 04/15/2014   Prostate cancer (HCC) 03/15/2013   DOE (dyspnea on exertion) 11/23/2012   Insomnia 09/03/2012   Hypertension 07/23/2012   CKD (chronic kidney disease), stage IIIa 07/23/2011   History of  pulmonary embolism 01/09/2011   Paroxysmal atrial fibrillation (HCC) 09/06/2010   Mixed hyperlipidemia 05/19/2009    PCP: Glori Luis, MD  REFERRING PROVIDER: Glori Luis, MD  REFERRING DIAG: R26.89 (ICD-10-CM) - Balance problem  Rationale for Evaluation and Treatment: Rehabilitation  THERAPY DIAG:  Unsteadiness on feet  Difficulty in walking, not elsewhere classified  History of falling  ONSET DATE: 2-3 months ago  SUBJECTIVE:                                                                                                                                                                                           SUBJECTIVE STATEMENT:  Pt doing fine, no updates since prior visit. Her reports one of his HEP activities causes some back pain toward the end.    PERTINENT HISTORY:  Balance. Has been using a SPC for about 8-9 months and a rw at night for about 5-6 months. Has had difficulty with his balance about 2-3 months ago and has not improved. Had 2 back operations years ago. Took a bad fall many years ago and screwed up both knees. Went to the ER about month ago and had chest and upper back pain with sweating. Has happened several times before. Has had 2 stents and one heart attack. Can have CPR performed if needed.   Feels like losing his balance when he turns while talking or doing something.   No latex allergies  Blood pressure is pretty much controlled per pt.    PAIN:  Are you having pain? No  PRECAUTIONS: Fall, Has nitroglycerin for his heart  RED FLAGS: Bowel or bladder incontinence: No and Cauda equina syndrome: No   WEIGHT BEARING RESTRICTIONS: No  FALLS:  Has patient fallen in last 6 months? Yes. Number of falls 1  At night. Uses a walker to go to the bathroom and his R foot got caught at the edge of the walker.   LIVING ENVIRONMENT: Lives  with: lives with their spouse Lives in: House/apartment Stairs: No Has following equipment at home:  Single point cane, Environmental consultant - 2 wheeled, and Grab bars  OCCUPATION: retired  PLOF: Independent with household mobility with device  PATIENT GOALS: Walk better.   NEXT MD VISIT: none  OBJECTIVE:   TODAY'S TREATMENT: DATE: 06/30/23  TherEx: -STS from elevated plinth hands on knees 1x8 (24.5 inches)  -seated marching 1x12 bilat, 2lb AW  -STS from elevated plinth hands on knees 1x8 (24.5 inches)  -seated marching 1x12 bilat, 2lb AW  *sit break  -2lb AW donned, AMB cirlces around plinth c PRN 2 hand support (1x CW, 1x CCW)  -2lb AW donned 8" stool stelp taps, supervision with PRN hand support x16  *sit break  -airex stance head turns 12x alternating R/L -airex stance eyes closed x30sec, minGuard assist *sit break  -4lb AW donned, AMB cirlces around plinth c PRN 2 hand support (1x CW, 1x CCW)  -4b AW donned 8" stool stelp taps, supervision with PRN hand support x16  *sit break  -airex stance eyes closed x30sec, minGuard assist -4lb AW donned, AMB cirlces around plinth c PRN 2 hand support (1x CW, 1x CCW)  -4" step up anterior/down anterior + 180 degree turnaround (8x each)   -overground AMB with yellow TB and 2b AW bilat, alternate fwd/backward AMB without device/suuport  PATIENT EDUCATION:  Education details: POC Person educated: Patient Education method: Explanation Education comprehension: verbalized understanding  HOME EXERCISE PROGRAM: Access Code: [email or web address] URL: [email or web address] Date: 05/19/2023 Prepared by: Loralyn Freshwater  Exercises - Seated Scapular Retraction  - 2 x daily - 7 x weekly - 3 sets - 10 reps - 5 seconds hold - Standing Hip Abduction with Resistance at Ankles and Counter Support  - 1 x daily - 7 x weekly - 3 sets - 10 reps  Yellow band    ASSESSMENT:  CLINICAL IMPRESSION:  Pt responded well to the balance exercises as well as the strengthening tasks given as part of plan.  Pt still noted to have some unsteadiness, encouraged  self-correction when performing ambulation with head turns/nods.  Pt most notably off balance when performing SLS on the L LE.  Pt would continue to benefit from strengthening and challenging the L LE in order to improve overall balance as therapy progresses.   Pt will continue to benefit from skilled therapy to address remaining deficits in order to improve overall QoL and return to PLOF.         OBJECTIVE IMPAIRMENTS: Abnormal gait, decreased activity tolerance, decreased balance, difficulty walking, decreased strength, improper body mechanics, and postural dysfunction.   ACTIVITY LIMITATIONS: carrying, lifting, standing, stairs, transfers, and locomotion level  PARTICIPATION LIMITATIONS:   PERSONAL FACTORS: Age, Fitness, Past/current experiences, and 3+ comorbidities: CA, CAD, HTN, MI, orthostaic hypotension, PAF  are also affecting patient's functional outcome.   REHAB POTENTIAL: Fair    CLINICAL DECISION MAKING: Stable/uncomplicated  EVALUATION COMPLEXITY: Low   GOALS: Goals reviewed with patient? Yes  SHORT TERM GOALS: Target date: 06/06/2023   Pt will be independent with his initial HEP to improve strength, balance, and function.  Baseline: Pt has not yet started his initial HEP (05/14/2023) Goal status: INITIAL  LONG TERM GOALS: Target date: 07/11/2023  Pt will improve his 5 times sit to stand time to 12 seconds or less without UE assist as a demonstration of improved functional LE strength.  Baseline: 21 seconds with B UE assist (05/14/2023) Goal status: INITIAL  2.  Pt will  improve his TUG time to 12 seconds or less without AD as a demonstration of improved functional mobility and balance. Baseline: Timed up and go (TUG): 14.2 seconds without AD (05/14/2023) Goal status: INITIAL  3.  Pt will improve his Dynamic Gait Index score to 19 or more without AD as a demonstration of improved balance.  Baseline: Dynamic Gait Index: 15 with SPC on R side (05/14/2023) Goal  status: INITIAL  PLAN:  PT FREQUENCY: 2x/week  PT DURATION: 8 weeks  PLANNED INTERVENTIONS: Therapeutic exercises, Therapeutic activity, Neuromuscular re-education, Balance training, Gait training, Patient/Family education, Stair training, Vestibular training, Canalith repositioning, Dry Needling, Electrical stimulation, Manual therapy, and Re-evaluation.  PLAN FOR NEXT SESSION: Functional LE, glute med, max, quadriceps strengthening, balance, manual techniques, modalities PRN.   9:10 AM, 06/30/23 Rosamaria Lints, PT, DPT Physical Therapist - [place] [phone] (Office)

## 2023-06-30 NOTE — Telephone Encounter (Signed)
Spoke to pt, scheduled toc for 07/14/23

## 2023-07-02 ENCOUNTER — Ambulatory Visit: Payer: Medicare HMO

## 2023-07-02 DIAGNOSIS — R2681 Unsteadiness on feet: Secondary | ICD-10-CM

## 2023-07-02 DIAGNOSIS — R262 Difficulty in walking, not elsewhere classified: Secondary | ICD-10-CM

## 2023-07-02 DIAGNOSIS — Z9181 History of falling: Secondary | ICD-10-CM

## 2023-07-02 NOTE — Therapy (Signed)
OUTPATIENT PHYSICAL THERAPY TREATMENT   Patient Name: William Taylor MRN: [id number] DOB:03/14/1930, 88 y.o., male Today's Date: 07/02/2023  END OF SESSION:  PT End of Session - 07/02/23 1308     Visit Number 9    Number of Visits 17    Date for PT Re-Evaluation 07/11/23    Authorization Type [employer] Medicare    Progress Note Due on Visit 10    PT Start Time 1304    PT Stop Time 1345    PT Time Calculation (min) 41 min    Equipment Utilized During Treatment Gait belt    Activity Tolerance Patient tolerated treatment well    Behavior During Therapy WFL for tasks assessed/performed                  Past Medical History:  Diagnosis Date   Cancer (HCC)    Prostate, followed by Dr. Achilles Dunk   Colon polyp    Coronary artery disease    a. 2005 s/p PCI LAD ([facility]); b 2012 s/p PCI OM2; c.10/2013 Cath: LAD 2m ISR, 34m, D1 90, D2 70, OM2 80 w/ patent stent;  d. 06/2019 Low risk MV; e. 07/2020 MV: EF 77%, no ischemia/infarct.   Gall stones    Hyperlipidemia    Hypertension    MI (myocardial infarction) (HCC)    2015   Orthostatic hypotension    a. prev on midodrine/florinef/northera.   PAF (paroxysmal atrial fibrillation) (HCC)    a. on xarelto and amio (CHA2DS2VASc = 4).   Skin cancer    Past Surgical History:  Procedure Laterality Date   CARDIAC CATHETERIZATION  10/17/2013   [facility]   CATARACT EXTRACTION  05/22/2011   right eye   colonoscopy     COLONOSCOPY W/ POLYPECTOMY  08/19/2013   Dr Shelle Iron   COLONOSCOPY WITH PROPOFOL N/A 08/15/2016   Procedure: COLONOSCOPY WITH PROPOFOL;  Surgeon: Wyline Mood, MD;  Location: [facility] ENDOSCOPY;  Service: Endoscopy;  Laterality: N/A;   CORONARY ANGIOPLASTY  08/20/2007   2005; s/p stent   ENTEROSCOPY N/A 09/22/2018   Procedure: ENTEROSCOPY;  Surgeon: Toney Reil, MD;  Location: [facility] ENDOSCOPY;  Service: Gastroenterology;  Laterality: N/A;   ESOPHAGOGASTRODUODENOSCOPY (EGD) WITH PROPOFOL N/A 08/15/2016   Procedure:  ESOPHAGOGASTRODUODENOSCOPY (EGD) WITH PROPOFOL;  Surgeon: Wyline Mood, MD;  Location: [facility] ENDOSCOPY;  Service: Endoscopy;  Laterality: N/A;   GIVENS CAPSULE STUDY N/A 09/26/2016   Procedure: GIVENS CAPSULE STUDY;  Surgeon: Wyline Mood, MD;  Location: [facility] ENDOSCOPY;  Service: Endoscopy;  Laterality: N/A;   HERNIA REPAIR  08/20/2011   LEFT HEART CATH AND CORONARY ANGIOGRAPHY N/A 04/29/2022   Procedure: LEFT HEART CATH AND CORONARY ANGIOGRAPHY;  Surgeon: Iran Ouch, MD;  Location: [facility] INVASIVE CV LAB;  Service: Cardiovascular;  Laterality: N/A;   LUMBAR SPINE SURGERY     x2 in [place] in 1990's   UPPER GI ENDOSCOPY  04/19/2014   Dr Shelle Iron   Patient Active Problem List   Diagnosis Date Noted   Balance problem 04/30/2023   Compression fracture of T2 vertebra (HCC) 03/05/2023   Cerebral infarction, chronic 03/05/2023   Thyroid nodule 03/05/2023   Chronic constipation 08/06/2022   PVC's (premature ventricular contractions) 05/07/2022   Neuropathic pain 04/04/2022   Spondylolisthesis, lumbar region 03/14/2022   Hypophosphatemia 09/02/2021   Allergic rhinitis 08/30/2020   Coronary artery disease    GERD (gastroesophageal reflux disease)    Benign prostatic hyperplasia with lower urinary tract symptoms 08/13/2020   Fall 11/11/2019   Cold agglutinin  disease (HCC) 08/31/2019   Osteoarthritis of knees, bilateral 08/31/2019   History of GI bleed    Orthostatic hypotension 06/06/2018   Chronic pain of left knee 06/05/2018   Chronic back pain 02/17/2018   Angiodysplasia of intestinal tract    Iron deficiency anemia    Benign neoplasm of ascending colon    Diverticulosis of large intestine without diverticulitis    Gastric polyp    Left rotator cuff tear arthropathy 09/11/2015   Fatigue 04/15/2014   Prostate cancer (HCC) 03/15/2013   DOE (dyspnea on exertion) 11/23/2012   Insomnia 09/03/2012   Hypertension 07/23/2012   CKD (chronic kidney disease), stage IIIa 07/23/2011   History of  pulmonary embolism 01/09/2011   Paroxysmal atrial fibrillation (HCC) 09/06/2010   Mixed hyperlipidemia 05/19/2009    PCP: Glori Luis, MD  REFERRING PROVIDER: Glori Luis, MD  REFERRING DIAG: R26.89 (ICD-10-CM) - Balance problem  Rationale for Evaluation and Treatment: Rehabilitation  THERAPY DIAG:  Unsteadiness on feet  Difficulty in walking, not elsewhere classified  History of falling  ONSET DATE: 2-3 months ago  SUBJECTIVE:                                                                                                                                                                                           SUBJECTIVE STATEMENT:   Pt reports no new complaints upon arrival.  Pt denies any pain and is ready to begin therapy.   PERTINENT HISTORY:  Balance. Has been using a SPC for about 8-9 months and a rw at night for about 5-6 months. Has had difficulty with his balance about 2-3 months ago and has not improved. Had 2 back operations years ago. Took a bad fall many years ago and screwed up both knees. Went to the ER about month ago and had chest and upper back pain with sweating. Has happened several times before. Has had 2 stents and one heart attack. Can have CPR performed if needed.   Feels like losing his balance when he turns while talking or doing something.   No latex allergies  Blood pressure is pretty much controlled per pt.    PAIN:  Are you having pain? No  PRECAUTIONS: Fall, Has nitroglycerin for his heart  RED FLAGS: Bowel or bladder incontinence: No and Cauda equina syndrome: No   WEIGHT BEARING RESTRICTIONS: No  FALLS:  Has patient fallen in last 6 months? Yes. Number of falls 1  At night. Uses a walker to go to the bathroom and his R foot got caught at the edge of the walker.   LIVING ENVIRONMENT: Lives with: lives with  their spouse Lives in: House/apartment Stairs: No Has following equipment at home: Single point cane, Environmental consultant - 2  wheeled, and Grab bars  OCCUPATION: retired  PLOF: Independent with household mobility with device  PATIENT GOALS: Walk better.   NEXT MD VISIT: none  OBJECTIVE:   TODAY'S TREATMENT: DATE: 07/02/23   TherEx:   NuStep 5 minutes (L4 - 2 minutes, L3 - 3 minutes), pt noted to have some increased R knee pain during first 2 minutes, so level was adjusted to 3 with no adverse effects Standing lunges at stairs for increased mobility of the R knee, 2x10 Forward steps onto 6" step with B UE support, x10 each LE leading Lateral steps onto 6" step with B UE support, x10 each LE leading Squats with UE support at side of stairs, 2x10 STS at edge of plinth, use of UE's to come upright with slow eccentric lowering Seated leg press at OMEGA, 55#, 2x10  Overground AMB with green TB and 2b AW bilat, alternate fwd/backward AMB without device/support   PATIENT EDUCATION:  Education details: POC Person educated: Patient Education method: Explanation Education comprehension: verbalized understanding  HOME EXERCISE PROGRAM: Access Code: [email or web address] URL: [email or web address] Date: 05/19/2023 Prepared by: Loralyn Freshwater  Exercises - Seated Scapular Retraction  - 2 x daily - 7 x weekly - 3 sets - 10 reps - 5 seconds hold - Standing Hip Abduction with Resistance at Ankles and Counter Support  - 1 x daily - 7 x weekly - 3 sets - 10 reps  Yellow band    ASSESSMENT:  CLINICAL IMPRESSION:  Pt continues to respond well to the exercises noting an increased ability to perform with increased resistance.  Pt also demonstrating better form with squats and was able to perform STS's with controlled descent throughout the exercise.  Pt continues to improve in strength and functional mobility throughout current POC.  Pt is demonstrating reduced risk of falling and will continue to improve in this area as therapy progresses.   Pt will continue to benefit from skilled therapy to address remaining  deficits in order to improve overall QoL and return to PLOF.        OBJECTIVE IMPAIRMENTS: Abnormal gait, decreased activity tolerance, decreased balance, difficulty walking, decreased strength, improper body mechanics, and postural dysfunction.   ACTIVITY LIMITATIONS: carrying, lifting, standing, stairs, transfers, and locomotion level  PARTICIPATION LIMITATIONS:   PERSONAL FACTORS: Age, Fitness, Past/current experiences, and 3+ comorbidities: CA, CAD, HTN, MI, orthostaic hypotension, PAF  are also affecting patient's functional outcome.   REHAB POTENTIAL: Fair    CLINICAL DECISION MAKING: Stable/uncomplicated  EVALUATION COMPLEXITY: Low   GOALS: Goals reviewed with patient? Yes  SHORT TERM GOALS: Target date: 06/06/2023   Pt will be independent with his initial HEP to improve strength, balance, and function.  Baseline: Pt has not yet started his initial HEP (05/14/2023) Goal status: INITIAL  LONG TERM GOALS: Target date: 07/11/2023  Pt will improve his 5 times sit to stand time to 12 seconds or less without UE assist as a demonstration of improved functional LE strength.  Baseline: 21 seconds with B UE assist (05/14/2023) Goal status: INITIAL  2.  Pt will improve his TUG time to 12 seconds or less without AD as a demonstration of improved functional mobility and balance. Baseline: Timed up and go (TUG): 14.2 seconds without AD (05/14/2023) Goal status: INITIAL  3.  Pt will improve his Dynamic Gait Index score to 19 or more without AD as a demonstration of improved balance.  Baseline: Dynamic Gait Index: 15 with SPC on R side (05/14/2023) Goal status: INITIAL  PLAN:  PT FREQUENCY: 2x/week  PT DURATION: 8 weeks  PLANNED INTERVENTIONS: Therapeutic exercises, Therapeutic activity, Neuromuscular re-education, Balance training, Gait training, Patient/Family education, Stair training, Vestibular training, Canalith repositioning, Dry Needling, Electrical stimulation, Manual  therapy, and Re-evaluation.  PLAN FOR NEXT SESSION:  Functional LE, glute med, max, quadriceps strengthening, balance, manual techniques, modalities PRN.    Nolon Bussing, PT, DPT Physical Therapist - [facility]  07/02/23, 1:11 PM

## 2023-07-07 ENCOUNTER — Ambulatory Visit: Payer: Medicare HMO

## 2023-07-07 DIAGNOSIS — R262 Difficulty in walking, not elsewhere classified: Secondary | ICD-10-CM

## 2023-07-07 DIAGNOSIS — R2681 Unsteadiness on feet: Secondary | ICD-10-CM | POA: Diagnosis not present

## 2023-07-07 DIAGNOSIS — Z9181 History of falling: Secondary | ICD-10-CM

## 2023-07-07 NOTE — Therapy (Signed)
OUTPATIENT PHYSICAL THERAPY TREATMENT/PHYSICAL THERAPY PROGRESS NOTE   Dates of reporting period  05/14/23   to   07/07/23    Patient Name: William Taylor MRN: [id number] DOB:01/15/1930, 89 y.o., male Today's Date: 07/07/2023  END OF SESSION:  PT End of Session - 07/07/23 0947     Visit Number 10    Number of Visits 17    Date for PT Re-Evaluation 07/11/23    Authorization Type [employer]    Progress Note Due on Visit 10    PT Start Time [phone]    PT Stop Time 1030    PT Time Calculation (min) 43 min    Equipment Utilized During Treatment Gait belt    Activity Tolerance Patient tolerated treatment well    Behavior During Therapy WFL for tasks assessed/performed                   Past Medical History:  Diagnosis Date   Cancer (HCC)    Prostate, followed by Dr. Achilles Dunk   Colon polyp    Coronary artery disease    a. 2005 s/p PCI LAD ([facility]); b 2012 s/p PCI OM2; c.10/2013 Cath: LAD 25m ISR, 34m, D1 90, D2 70, OM2 80 w/ patent stent;  d. 06/2019 Low risk MV; e. 07/2020 MV: EF 77%, no ischemia/infarct.   Gall stones    Hyperlipidemia    Hypertension    MI (myocardial infarction) (HCC)    2015   Orthostatic hypotension    a. prev on midodrine/florinef/northera.   PAF (paroxysmal atrial fibrillation) (HCC)    a. on xarelto and amio (CHA2DS2VASc = 4).   Skin cancer    Past Surgical History:  Procedure Laterality Date   CARDIAC CATHETERIZATION  10/17/2013   [facility]   CATARACT EXTRACTION  05/22/2011   right eye   colonoscopy     COLONOSCOPY W/ POLYPECTOMY  08/19/2013   Dr Shelle Iron   COLONOSCOPY WITH PROPOFOL N/A 08/15/2016   Procedure: COLONOSCOPY WITH PROPOFOL;  Surgeon: Wyline Mood, MD;  Location: [facility] ENDOSCOPY;  Service: Endoscopy;  Laterality: N/A;   CORONARY ANGIOPLASTY  08/20/2007   2005; s/p stent   ENTEROSCOPY N/A 09/22/2018   Procedure: ENTEROSCOPY;  Surgeon: Toney Reil, MD;  Location: [facility] ENDOSCOPY;  Service: Gastroenterology;  Laterality: N/A;    ESOPHAGOGASTRODUODENOSCOPY (EGD) WITH PROPOFOL N/A 08/15/2016   Procedure: ESOPHAGOGASTRODUODENOSCOPY (EGD) WITH PROPOFOL;  Surgeon: Wyline Mood, MD;  Location: [facility] ENDOSCOPY;  Service: Endoscopy;  Laterality: N/A;   GIVENS CAPSULE STUDY N/A 09/26/2016   Procedure: GIVENS CAPSULE STUDY;  Surgeon: Wyline Mood, MD;  Location: [facility] ENDOSCOPY;  Service: Endoscopy;  Laterality: N/A;   HERNIA REPAIR  08/20/2011   LEFT HEART CATH AND CORONARY ANGIOGRAPHY N/A 04/29/2022   Procedure: LEFT HEART CATH AND CORONARY ANGIOGRAPHY;  Surgeon: Iran Ouch, MD;  Location: [facility] INVASIVE CV LAB;  Service: Cardiovascular;  Laterality: N/A;   LUMBAR SPINE SURGERY     x2 in [place] in 1990's   UPPER GI ENDOSCOPY  04/19/2014   Dr Shelle Iron   Patient Active Problem List   Diagnosis Date Noted   Balance problem 04/30/2023   Compression fracture of T2 vertebra (HCC) 03/05/2023   Cerebral infarction, chronic 03/05/2023   Thyroid nodule 03/05/2023   Chronic constipation 08/06/2022   PVC's (premature ventricular contractions) 05/07/2022   Neuropathic pain 04/04/2022   Spondylolisthesis, lumbar region 03/14/2022   Hypophosphatemia 09/02/2021   Allergic rhinitis 08/30/2020   Coronary artery disease    GERD (gastroesophageal reflux disease)  Benign prostatic hyperplasia with lower urinary tract symptoms 08/13/2020   Fall 11/11/2019   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   Osteoarthritis of knees, bilateral 08/31/2019   History of GI bleed    Orthostatic hypotension 06/06/2018   Chronic pain of left knee 06/05/2018   Chronic back pain 02/17/2018   Angiodysplasia of intestinal tract    Iron deficiency anemia    Benign neoplasm of ascending colon    Diverticulosis of large intestine without diverticulitis    Gastric polyp    Left rotator cuff tear arthropathy 09/11/2015   Fatigue 04/15/2014   Prostate cancer (HCC) 03/15/2013   DOE (dyspnea on exertion) 11/23/2012   Insomnia 09/03/2012   Hypertension  07/23/2012   CKD (chronic kidney disease), stage IIIa 07/23/2011   History of pulmonary embolism 01/09/2011   Paroxysmal atrial fibrillation (HCC) 09/06/2010   Mixed hyperlipidemia 05/19/2009    PCP: Glori Luis, MD  REFERRING PROVIDER: Glori Luis, MD  REFERRING DIAG: R26.89 (ICD-10-CM) - Balance problem  Rationale for Evaluation and Treatment: Rehabilitation  THERAPY DIAG:  Unsteadiness on feet  Difficulty in walking, not elsewhere classified  History of falling  ONSET DATE: 2-3 months ago  SUBJECTIVE:                                                                                                                                                                                           SUBJECTIVE STATEMENT:   Pt denies nay falls since the last visit and is doing pretty good.     PERTINENT HISTORY:  Balance. Has been using a SPC for about 8-9 months and a rw at night for about 5-6 months. Has had difficulty with his balance about 2-3 months ago and has not improved. Had 2 back operations years ago. Took a bad fall many years ago and screwed up both knees. Went to the ER about month ago and had chest and upper back pain with sweating. Has happened several times before. Has had 2 stents and one heart attack. Can have CPR performed if needed.   Feels like losing his balance when he turns while talking or doing something.   No latex allergies  Blood pressure is pretty much controlled per pt.    PAIN:  Are you having pain? No  PRECAUTIONS: Fall, Has nitroglycerin for his heart  RED FLAGS: Bowel or bladder incontinence: No and Cauda equina syndrome: No   WEIGHT BEARING RESTRICTIONS: No  FALLS:  Has patient fallen in last 6 months? Yes. Number of falls 1  At night. Uses a walker to go to the bathroom and his R foot got caught  at the edge of the walker.   LIVING ENVIRONMENT: Lives with: lives with their spouse Lives in: House/apartment Stairs: No Has  following equipment at home: Single point cane, Environmental consultant - 2 wheeled, and Grab bars  OCCUPATION: retired  PLOF: Independent with household mobility with device  PATIENT GOALS: Walk better.   NEXT MD VISIT: none  OBJECTIVE:   TODAY'S TREATMENT: DATE: 07/07/23   Neuro:  Activity Description: Random tapping with B LE's, (Red light = R LE tap, Green light = L LE tap) Activity Setting:  Random Number of Pods:  5 Cycles/Sets:  2 cycles Duration (Time or Hit Count):  25 hits  The Blaze Pod Random setting was chosen to enhance cognitive processing and agility, providing an unpredictable environment to simulate real-world scenarios, and fostering quick reactions and adaptability.     Activity Description: Pods set up in 4 corners (~10' away) and pt sidestepped, walked backwards/forward to hit the next lit pod Activity Setting:  Random Number of Pods:   Cycles/Sets:  2 cycles Duration (Time or Hit Count):  10 hits    TherAct:  Goal assessment performed and noted below:    PATIENT EDUCATION:  Education details: POC Person educated: Patient Education method: Explanation Education comprehension: verbalized understanding  HOME EXERCISE PROGRAM: Access Code: [email or web address] URL: [email or web address] Date: 05/19/2023 Prepared by: Loralyn Freshwater  Exercises - Seated Scapular Retraction  - 2 x daily - 7 x weekly - 3 sets - 10 reps - 5 seconds hold - Standing Hip Abduction with Resistance at Ankles and Counter Support  - 1 x daily - 7 x weekly - 3 sets - 10 reps  Yellow band    ASSESSMENT:  CLINICAL IMPRESSION:  Pt is making significant progress towards goals at this time.  Pt was able to achieve DGI goal and made progress towards STS and TUG goals.  Pt will continue to improve with balance and strength training in order to improve overall stability of the LE's.  Pt responded well to the blaze pods activity well.  Patient's condition has the potential to improve in  response to therapy. Maximum improvement is yet to be obtained. The anticipated improvement is attainable and reasonable in a generally predictable time.   Pt will continue to benefit from skilled therapy to address remaining deficits in order to improve overall QoL and return to PLOF.          OBJECTIVE IMPAIRMENTS: Abnormal gait, decreased activity tolerance, decreased balance, difficulty walking, decreased strength, improper body mechanics, and postural dysfunction.   ACTIVITY LIMITATIONS: carrying, lifting, standing, stairs, transfers, and locomotion level  PARTICIPATION LIMITATIONS:   PERSONAL FACTORS: Age, Fitness, Past/current experiences, and 3+ comorbidities: CA, CAD, HTN, MI, orthostaic hypotension, PAF  are also affecting patient's functional outcome.   REHAB POTENTIAL: Fair    CLINICAL DECISION MAKING: Stable/uncomplicated  EVALUATION COMPLEXITY: Low   GOALS: Goals reviewed with patient? Yes  SHORT TERM GOALS: Target date: 06/06/2023   Pt will be independent with his initial HEP to improve strength, balance, and function.  Baseline: Pt has not yet started his initial HEP (05/14/2023) Goal status: INITIAL  LONG TERM GOALS: Target date: 07/11/2023  Pt will improve his 5 times sit to stand time to 12 seconds or less without UE assist as a demonstration of improved functional LE strength.  Baseline: 21 seconds with B UE assist (05/14/2023) 07/07/23: 14.93 sec Goal status: ONGOING  2.  Pt will improve his TUG time to 12 seconds or less without AD as a  demonstration of improved functional mobility and balance. Baseline: Timed up and go (TUG): 14.2 seconds without AD (05/14/2023) 07/07/23: 13.76 without AD Goal status: ONGOING  3.  Pt will improve his Dynamic Gait Index score to 19 or more without AD as a demonstration of improved balance.  Baseline: Dynamic Gait Index: 15 with SPC on R side (05/14/2023) 07/07/23: 23/24 Goal status: MET  PLAN:  PT FREQUENCY:  2x/week  PT DURATION: 8 weeks  PLANNED INTERVENTIONS: Therapeutic exercises, Therapeutic activity, Neuromuscular re-education, Balance training, Gait training, Patient/Family education, Stair training, Vestibular training, Canalith repositioning, Dry Needling, Electrical stimulation, Manual therapy, and Re-evaluation.  PLAN FOR NEXT SESSION:   Functional LE, glute med, max, quadriceps strengthening, balance, manual techniques, modalities PRN.    Nolon Bussing, PT, DPT Physical Therapist - [facility]  07/07/23, 10:34 AM

## 2023-07-10 ENCOUNTER — Ambulatory Visit: Payer: Medicare HMO

## 2023-07-10 DIAGNOSIS — R2681 Unsteadiness on feet: Secondary | ICD-10-CM

## 2023-07-10 DIAGNOSIS — Z9181 History of falling: Secondary | ICD-10-CM

## 2023-07-10 DIAGNOSIS — R262 Difficulty in walking, not elsewhere classified: Secondary | ICD-10-CM

## 2023-07-10 NOTE — Addendum Note (Signed)
Addended by: Phineas Real on: 07/10/2023 12:24 PM   Modules accepted: Orders

## 2023-07-10 NOTE — Therapy (Signed)
OUTPATIENT PHYSICAL THERAPY TREATMENT/RE-CERT    Patient Name: William Taylor MRN: [id number] DOB:May 07, 1930, 89 y.o., male Today's Date: 07/10/2023  END OF SESSION:  PT End of Session - 07/10/23 0949     Visit Number 11    Number of Visits 20    Date for PT Re-Evaluation 08/21/23    Authorization Type [employer] Medicare    Progress Note Due on Visit 10    PT Start Time [phone]    PT Stop Time 1030    PT Time Calculation (min) 43 min    Equipment Utilized During Treatment Gait belt    Activity Tolerance Patient tolerated treatment well    Behavior During Therapy WFL for tasks assessed/performed                    Past Medical History:  Diagnosis Date   Cancer (HCC)    Prostate, followed by Dr. Achilles Dunk   Colon polyp    Coronary artery disease    a. 2005 s/p PCI LAD ([facility]); b 2012 s/p PCI OM2; c.10/2013 Cath: LAD 27m ISR, 61m, D1 90, D2 70, OM2 80 w/ patent stent;  d. 06/2019 Low risk MV; e. 07/2020 MV: EF 77%, no ischemia/infarct.   Gall stones    Hyperlipidemia    Hypertension    MI (myocardial infarction) (HCC)    2015   Orthostatic hypotension    a. prev on midodrine/florinef/northera.   PAF (paroxysmal atrial fibrillation) (HCC)    a. on xarelto and amio (CHA2DS2VASc = 4).   Skin cancer    Past Surgical History:  Procedure Laterality Date   CARDIAC CATHETERIZATION  10/17/2013   [facility]   CATARACT EXTRACTION  05/22/2011   right eye   colonoscopy     COLONOSCOPY W/ POLYPECTOMY  08/19/2013   Dr Shelle Iron   COLONOSCOPY WITH PROPOFOL N/A 08/15/2016   Procedure: COLONOSCOPY WITH PROPOFOL;  Surgeon: Wyline Mood, MD;  Location: [facility] ENDOSCOPY;  Service: Endoscopy;  Laterality: N/A;   CORONARY ANGIOPLASTY  08/20/2007   2005; s/p stent   ENTEROSCOPY N/A 09/22/2018   Procedure: ENTEROSCOPY;  Surgeon: Toney Reil, MD;  Location: [facility] ENDOSCOPY;  Service: Gastroenterology;  Laterality: N/A;   ESOPHAGOGASTRODUODENOSCOPY (EGD) WITH PROPOFOL N/A 08/15/2016   Procedure:  ESOPHAGOGASTRODUODENOSCOPY (EGD) WITH PROPOFOL;  Surgeon: Wyline Mood, MD;  Location: [facility] ENDOSCOPY;  Service: Endoscopy;  Laterality: N/A;   GIVENS CAPSULE STUDY N/A 09/26/2016   Procedure: GIVENS CAPSULE STUDY;  Surgeon: Wyline Mood, MD;  Location: [facility] ENDOSCOPY;  Service: Endoscopy;  Laterality: N/A;   HERNIA REPAIR  08/20/2011   LEFT HEART CATH AND CORONARY ANGIOGRAPHY N/A 04/29/2022   Procedure: LEFT HEART CATH AND CORONARY ANGIOGRAPHY;  Surgeon: Iran Ouch, MD;  Location: [facility] INVASIVE CV LAB;  Service: Cardiovascular;  Laterality: N/A;   LUMBAR SPINE SURGERY     x2 in [place] in 1990's   UPPER GI ENDOSCOPY  04/19/2014   Dr Shelle Iron   Patient Active Problem List   Diagnosis Date Noted   Balance problem 04/30/2023   Compression fracture of T2 vertebra (HCC) 03/05/2023   Cerebral infarction, chronic 03/05/2023   Thyroid nodule 03/05/2023   Chronic constipation 08/06/2022   PVC's (premature ventricular contractions) 05/07/2022   Neuropathic pain 04/04/2022   Spondylolisthesis, lumbar region 03/14/2022   Hypophosphatemia 09/02/2021   Allergic rhinitis 08/30/2020   Coronary artery disease    GERD (gastroesophageal reflux disease)    Benign prostatic hyperplasia with lower urinary tract symptoms 08/13/2020   Fall 11/11/2019  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   Osteoarthritis of knees, bilateral 08/31/2019   History of GI bleed    Orthostatic hypotension 06/06/2018   Chronic pain of left knee 06/05/2018   Chronic back pain 02/17/2018   Angiodysplasia of intestinal tract    Iron deficiency anemia    Benign neoplasm of ascending colon    Diverticulosis of large intestine without diverticulitis    Gastric polyp    Left rotator cuff tear arthropathy 09/11/2015   Fatigue 04/15/2014   Prostate cancer (HCC) 03/15/2013   DOE (dyspnea on exertion) 11/23/2012   Insomnia 09/03/2012   Hypertension 07/23/2012   CKD (chronic kidney disease), stage IIIa 07/23/2011   History of  pulmonary embolism 01/09/2011   Paroxysmal atrial fibrillation (HCC) 09/06/2010   Mixed hyperlipidemia 05/19/2009    PCP: Glori Luis, MD  REFERRING PROVIDER: Glori Luis, MD  REFERRING DIAG: R26.89 (ICD-10-CM) - Balance problem  Rationale for Evaluation and Treatment: Rehabilitation  THERAPY DIAG:  Unsteadiness on feet  Difficulty in walking, not elsewhere classified  History of falling  ONSET DATE: 2-3 months ago  SUBJECTIVE:                                                                                                                                                                                           SUBJECTIVE STATEMENT:   Pt reports he is supposed to have a removal of some melanoma this morning following this appointment.    PERTINENT HISTORY:  Balance. Has been using a SPC for about 8-9 months and a rw at night for about 5-6 months. Has had difficulty with his balance about 2-3 months ago and has not improved. Had 2 back operations years ago. Took a bad fall many years ago and screwed up both knees. Went to the ER about month ago and had chest and upper back pain with sweating. Has happened several times before. Has had 2 stents and one heart attack. Can have CPR performed if needed.   Feels like losing his balance when he turns while talking or doing something.   No latex allergies  Blood pressure is pretty much controlled per pt.    PAIN:  Are you having pain? No  PRECAUTIONS: Fall, Has nitroglycerin for his heart  RED FLAGS: Bowel or bladder incontinence: No and Cauda equina syndrome: No   WEIGHT BEARING RESTRICTIONS: No  FALLS:  Has patient fallen in last 6 months? Yes. Number of falls 1  At night. Uses a walker to go to the bathroom and his R foot got caught at the edge of the walker.   LIVING ENVIRONMENT: Lives with:  lives with their spouse Lives in: House/apartment Stairs: No Has following equipment at home: Single point cane,  Environmental consultant - 2 wheeled, and Grab bars  OCCUPATION: retired  PLOF: Independent with household mobility with device  PATIENT GOALS: Walk better.   NEXT MD VISIT: none  OBJECTIVE:   TODAY'S TREATMENT: DATE: 07/10/23   TherEx:  Standing hip marches at [employer], 40#, 2x10 each LE Standing hip adduction at [employer], 40#, 2x10 each LE Standing hip abduction at [employer], 40#, 2x10 each LE Standing hip extension at [employer], 40#, 2x10 each LE Sled push with 30# weights added, x2 laps around gym   TherAct:  STS's with weight displaced forward using 3KG ball, 2x10 Ambulation in gym with focus on increased gait speed, 2x10    PATIENT EDUCATION:  Education details: POC Person educated: Patient Education method: Explanation Education comprehension: verbalized understanding  HOME EXERCISE PROGRAM: Access Code: [email or web address] URL: [email or web address] Date: 05/19/2023 Prepared by: Loralyn Freshwater  Exercises - Seated Scapular Retraction  - 2 x daily - 7 x weekly - 3 sets - 10 reps - 5 seconds hold - Standing Hip Abduction with Resistance at Ankles and Counter Support  - 1 x daily - 7 x weekly - 3 sets - 10 reps  Yellow band    ASSESSMENT:  CLINICAL IMPRESSION:  Pt has made consistent progress towards goals as noted in previous note.  Pt currently improved with balance and focus on gait speed in order to achieve goals.  Pt will likely be taking a shortened hiatus from therapy due to a dermatology procedure that he will be having following this session.  Pt will be extended in order to improve overall strength moving forward and reduce risk for falling.  Patient's condition has the potential to improve in response to therapy. Maximum improvement is yet to be obtained. The anticipated improvement is attainable and reasonable in a generally predictable time.   Pt will continue to benefit from skilled therapy to address remaining deficits in order to improve overall QoL and return to PLOF.            OBJECTIVE IMPAIRMENTS: Abnormal gait, decreased activity tolerance, decreased balance, difficulty walking, decreased strength, improper body mechanics, and postural dysfunction.   ACTIVITY LIMITATIONS: carrying, lifting, standing, stairs, transfers, and locomotion level  PARTICIPATION LIMITATIONS:   PERSONAL FACTORS: Age, Fitness, Past/current experiences, and 3+ comorbidities: CA, CAD, HTN, MI, orthostaic hypotension, PAF  are also affecting patient's functional outcome.   REHAB POTENTIAL: Fair    CLINICAL DECISION MAKING: Stable/uncomplicated  EVALUATION COMPLEXITY: Low   GOALS: Goals reviewed with patient? Yes  SHORT TERM GOALS: Target date: 06/06/2023   Pt will be independent with his initial HEP to improve strength, balance, and function.  Baseline: Pt has not yet started his initial HEP (05/14/2023) Goal status: INITIAL  LONG TERM GOALS: Target date: 07/11/2023  Pt will improve his 5 times sit to stand time to 12 seconds or less without UE assist as a demonstration of improved functional LE strength.  Baseline: 21 seconds with B UE assist (05/14/2023) 07/07/23: 14.93 sec Goal status: ONGOING  2.  Pt will improve his TUG time to 12 seconds or less without AD as a demonstration of improved functional mobility and balance. Baseline: Timed up and go (TUG): 14.2 seconds without AD (05/14/2023) 07/07/23: 13.76 without AD Goal status: ONGOING  3.  Pt will improve his Dynamic Gait Index score to 19 or more without AD as a demonstration of improved balance.  Baseline: Dynamic Gait Index:  15 with SPC on R side (05/14/2023) 07/07/23: 23/24 Goal status: MET  PLAN:  PT FREQUENCY: 2x/week  PT DURATION: 8 weeks  PLANNED INTERVENTIONS: Therapeutic exercises, Therapeutic activity, Neuromuscular re-education, Balance training, Gait training, Patient/Family education, Stair training, Vestibular training, Canalith repositioning, Dry Needling, Electrical stimulation,  Manual therapy, and Re-evaluation.  PLAN FOR NEXT SESSION:    Functional LE, glute med, max, quadriceps strengthening, balance, manual techniques, modalities PRN.    Nolon Bussing, PT, DPT Physical Therapist - [facility]  07/10/23, 12:22 PM

## 2023-07-14 ENCOUNTER — Ambulatory Visit (INDEPENDENT_AMBULATORY_CARE_PROVIDER_SITE_OTHER): Payer: Medicare HMO | Admitting: Family Medicine

## 2023-07-14 ENCOUNTER — Encounter: Payer: Self-pay | Admitting: Family Medicine

## 2023-07-14 VITALS — BP 134/82 | HR 87 | Temp 97.5°F | Ht 76.0 in | Wt 188.4 lb

## 2023-07-14 DIAGNOSIS — C439 Malignant melanoma of skin, unspecified: Secondary | ICD-10-CM

## 2023-07-14 DIAGNOSIS — D5912 Cold autoimmune hemolytic anemia: Secondary | ICD-10-CM | POA: Diagnosis not present

## 2023-07-14 DIAGNOSIS — I10 Essential (primary) hypertension: Secondary | ICD-10-CM | POA: Diagnosis not present

## 2023-07-14 DIAGNOSIS — Z7189 Other specified counseling: Secondary | ICD-10-CM

## 2023-07-14 DIAGNOSIS — G4700 Insomnia, unspecified: Secondary | ICD-10-CM

## 2023-07-14 MED ORDER — LISINOPRIL 10 MG PO TABS: 5.0000 mg | ORAL_TABLET | Freq: Every day | ORAL | Status: DC

## 2023-07-14 NOTE — Progress Notes (Unsigned)
From [facility] in [place].    He had melanoma resected by dermatology last week, per Dr. Roseanne Kaufman.    Rare use of ambien, not used recently.  Cautions d/w pt.   He fell a few months ago.  Using a walker at night but his foot got tangled.  H/o compression fracture, had seen neurosurgery.  He stopped using the walker in the meantime.    In PT in the meantime.     IMPRESSION: Enlarged, heterogeneous and multinodular thyroid gland.   None of the nodules meet criteria to warrant biopsy or imaging surveillance. No further imaging follow-up recommended.  Followed by hematology re: cold agglutinin anemia.    Wife designated if patient were incapacitated.    DXA has not been done yet.     He can feel the floor with this feet.  No vertigo but can feel lightheaded with standing.

## 2023-07-14 NOTE — Patient Instructions (Addendum)
Please ask the front for a record release from Dr. Roseanne Kaufman.    Take care.  Glad to see you.  Try cutting the lisinopril in half and update me about how you feel next week.

## 2023-07-17 ENCOUNTER — Encounter: Payer: Self-pay | Admitting: Family Medicine

## 2023-07-17 DIAGNOSIS — Z7189 Other specified counseling: Secondary | ICD-10-CM | POA: Insufficient documentation

## 2023-07-17 DIAGNOSIS — C439 Malignant melanoma of skin, unspecified: Secondary | ICD-10-CM | POA: Insufficient documentation

## 2023-07-17 NOTE — Assessment & Plan Note (Signed)
Could be overtreated, causing him to be lightheaded.  Discussed decreasing lisinopril to 5 mg a day and he can update me about his status next week.

## 2023-07-17 NOTE — Assessment & Plan Note (Signed)
He had melanoma resected by dermatology last week, per Dr. Roseanne Kaufman.  Requesting records.

## 2023-07-17 NOTE — Assessment & Plan Note (Signed)
Per hematology

## 2023-07-17 NOTE — Assessment & Plan Note (Signed)
Wife designated if patient were incapacitated.

## 2023-07-17 NOTE — Assessment & Plan Note (Signed)
Rare use of Ambien, not recently used.  Cautions discussed with patient.

## 2023-07-21 ENCOUNTER — Encounter: Payer: Self-pay | Admitting: Family Medicine

## 2023-07-27 ENCOUNTER — Other Ambulatory Visit: Payer: Self-pay | Admitting: Family Medicine

## 2023-07-27 MED ORDER — LISINOPRIL 5 MG PO TABS: 5.0000 mg | ORAL_TABLET | Freq: Every day | ORAL | 3 refills | Status: DC

## 2023-08-18 ENCOUNTER — Other Ambulatory Visit: Payer: Self-pay | Admitting: Family

## 2023-08-18 DIAGNOSIS — Z760 Encounter for issue of repeat prescription: Secondary | ICD-10-CM

## 2023-08-21 ENCOUNTER — Ambulatory Visit: Payer: Medicare HMO | Attending: Family Medicine

## 2023-08-21 DIAGNOSIS — R2681 Unsteadiness on feet: Secondary | ICD-10-CM | POA: Diagnosis present

## 2023-08-21 DIAGNOSIS — Z9181 History of falling: Secondary | ICD-10-CM | POA: Diagnosis present

## 2023-08-21 DIAGNOSIS — R262 Difficulty in walking, not elsewhere classified: Secondary | ICD-10-CM | POA: Insufficient documentation

## 2023-08-21 NOTE — Therapy (Signed)
 OUTPATIENT PHYSICAL THERAPY TREATMENT   Patient Name: William Taylor MRN: [id number] DOB:08/22/1929, 89 y.o., male Today's Date: 08/21/2023  END OF SESSION:  PT End of Session - 08/21/23 0949     Visit Number 12    Number of Visits 20    Date for PT Re-Evaluation 09/19/23    Authorization Type [employer]    Progress Note Due on Visit 10    PT Start Time [phone]    PT Stop Time 1032    PT Time Calculation (min) 43 min    Equipment Utilized During Treatment Gait belt    Activity Tolerance Patient tolerated treatment well    Behavior During Therapy WFL for tasks assessed/performed                     Past Medical History:  Diagnosis Date   Cancer (HCC)    Prostate, followed by Dr. Ike   Colon polyp    Coronary artery disease    a. 2005 s/p PCI LAD ([facility]); b 2012 s/p PCI OM2; c.10/2013 Cath: LAD 45m ISR, 3m, D1 90, D2 70, OM2 80 w/ patent stent;  d. 06/2019 Low risk MV; e. 07/2020 MV: EF 77%, no ischemia/infarct.   Gall stones    Hyperlipidemia    Hypertension    Melanoma (HCC)    MI (myocardial infarction) (HCC)    2015   Orthostatic hypotension    a. prev on midodrine /florinef /northera .   PAF (paroxysmal atrial fibrillation) (HCC)    a. on xarelto  and amio (CHA2DS2VASc = 4).   Skin cancer    Past Surgical History:  Procedure Laterality Date   CARDIAC CATHETERIZATION  10/17/2013   [facility]   CATARACT EXTRACTION  05/22/2011   right eye   colonoscopy     COLONOSCOPY W/ POLYPECTOMY  08/19/2013   Dr Jeri   COLONOSCOPY WITH PROPOFOL  N/A 08/15/2016   Procedure: COLONOSCOPY WITH PROPOFOL ;  Surgeon: Ruel Kung, MD;  Location: [facility] ENDOSCOPY;  Service: Endoscopy;  Laterality: N/A;   CORONARY ANGIOPLASTY  08/20/2007   2005; s/p stent   ENTEROSCOPY N/A 09/22/2018   Procedure: ENTEROSCOPY;  Surgeon: Unk Corinn Skiff, MD;  Location: [facility] ENDOSCOPY;  Service: Gastroenterology;  Laterality: N/A;   ESOPHAGOGASTRODUODENOSCOPY (EGD) WITH PROPOFOL  N/A 08/15/2016    Procedure: ESOPHAGOGASTRODUODENOSCOPY (EGD) WITH PROPOFOL ;  Surgeon: Ruel Kung, MD;  Location: [facility] ENDOSCOPY;  Service: Endoscopy;  Laterality: N/A;   GIVENS CAPSULE STUDY N/A 09/26/2016   Procedure: GIVENS CAPSULE STUDY;  Surgeon: Ruel Kung, MD;  Location: [facility] ENDOSCOPY;  Service: Endoscopy;  Laterality: N/A;   HERNIA REPAIR  08/20/2011   LEFT HEART CATH AND CORONARY ANGIOGRAPHY N/A 04/29/2022   Procedure: LEFT HEART CATH AND CORONARY ANGIOGRAPHY;  Surgeon: Darron Deatrice LABOR, MD;  Location: [facility] INVASIVE CV LAB;  Service: Cardiovascular;  Laterality: N/A;   LUMBAR SPINE SURGERY     x2 in [place] in 1990's   UPPER GI ENDOSCOPY  04/19/2014   Dr Jeri   Patient Active Problem List   Diagnosis Date Noted   Advance care planning 07/17/2023   Melanoma of skin (HCC) 07/17/2023   Balance problem 04/30/2023   Compression fracture of T2 vertebra (HCC) 03/05/2023   Cerebral infarction, chronic 03/05/2023   Thyroid  nodule 03/05/2023   Chronic constipation 08/06/2022   PVC's (premature ventricular contractions) 05/07/2022   Neuropathic pain 04/04/2022   Spondylolisthesis, lumbar region 03/14/2022   Hypophosphatemia 09/02/2021   Allergic rhinitis 08/30/2020   Coronary artery disease    GERD (gastroesophageal reflux  disease)    Benign prostatic hyperplasia with lower urinary tract symptoms 08/13/2020   Fall 11/11/2019   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   Osteoarthritis of knees, bilateral 08/31/2019   Orthostatic hypotension 06/06/2018   Chronic pain of left knee 06/05/2018   Chronic back pain 02/17/2018   Angiodysplasia of intestinal tract    Iron deficiency anemia    Diverticulosis of large intestine without diverticulitis    Gastric polyp    Left rotator cuff tear arthropathy 09/11/2015   Fatigue 04/15/2014   Prostate cancer (HCC) 03/15/2013   DOE (dyspnea on exertion) 11/23/2012   Insomnia 09/03/2012   Hypertension 07/23/2012   CKD (chronic kidney disease), stage IIIa  07/23/2011   History of pulmonary embolism 01/09/2011   Paroxysmal atrial fibrillation (HCC) 09/06/2010   Mixed hyperlipidemia 05/19/2009    PCP: Arlyss Solian, MD  REFERRING PROVIDER: Arlyss Solian, MD  REFERRING DIAG: R26.89 (ICD-10-CM) - Balance problem  Rationale for Evaluation and Treatment: Rehabilitation  THERAPY DIAG:  Unsteadiness on feet - Plan: PT plan of care cert/re-cert  Difficulty in walking, not elsewhere classified - Plan: PT plan of care cert/re-cert  History of falling - Plan: PT plan of care cert/re-cert  ONSET DATE: 2-3 months ago  SUBJECTIVE:                                                                                                                                                                                           SUBJECTIVE STATEMENT:  Had a couple of little bumps on the road. Had some surgery and took a fall about 3 weeks ago after the surgery to remove his melanoma R lateral trunk. Pt fell when he was walking and turned to the right causing him to fall onto his back . Did not break anything after that fall but was sore. Does not really want to get out of PT because he feels like he is just getting into it. Was feeling better while doing PT but then fell after the surgery. The surgical incision is healed. Very little pain in R lateral trunk and R shoulder.   Has a new doctor: Arlyss Solian, MD. Dr. Maribeth left.     PERTINENT HISTORY:  Balance. Has been using a SPC for about 8-9 months and a rw at night for about 5-6 months. Has had difficulty with his balance about 2-3 months ago and has not improved. Had 2 back operations years ago. Took a bad fall many years ago and screwed up both knees. Went to the ER about month ago and had chest and upper back pain with sweating. Has happened several times before. Has  had 2 stents and one heart attack. Can have CPR performed if needed.   Feels like losing his balance when he turns while talking or doing  something.   No latex allergies  Blood pressure is pretty much controlled per pt.    PAIN:  Are you having pain? No  PRECAUTIONS: Fall, Has nitroglycerin  for his heart  RED FLAGS: Bowel or bladder incontinence: No and Cauda equina syndrome: No   WEIGHT BEARING RESTRICTIONS: No  FALLS:  Has patient fallen in last 6 months? Yes. Number of falls 1  At night. Uses a walker to go to the bathroom and his R foot got caught at the edge of the walker.   LIVING ENVIRONMENT: Lives with: lives with their spouse Lives in: House/apartment Stairs: No Has following equipment at home: Single point cane, Environmental Consultant - 2 wheeled, and Grab bars  OCCUPATION: retired  PLOF: Independent with household mobility with device  PATIENT GOALS: Walk better.   NEXT MD VISIT: none  OBJECTIVE:     LOWER EXTREMITY MMT:    MMT Right 08/21/2023 Left 08/21/2023  Hip flexion 4- 4-  Hip extension (seated manually resisted) 3+ 3+  Hip abduction (seated manually resisted) 5 5  Hip adduction    Hip internal rotation    Hip external rotation    Knee flexion 5 4  Knee extension 5 5  Ankle dorsiflexion    Ankle plantarflexion    Ankle inversion    Ankle eversion     (Blank rows = not tested)     TODAY'S TREATMENT: DATE: 08/21/23   TherEx:  Five times sit <> to stand. 16.47 seconds with B UE assist  TUG 13.85 s, 13.03 s, 13.25s (13.38 seconds average)  Seated manually resisted hip extension, hip abduction, knee flexion and knee extension 1x each way for each LE  Gait with R and L, and up and down head turns 200 ft  Backwards gait 32 ft x 2 with cues to maintain center of gravity over feet  Standing forward weight shift onto forefeet, eyes closed, UE assist PRN 10x2 with 5 second holds. LOB forward CGA  Static standing mini lunge with contralateral UE assist   R 5x2  L 5x  Improved exercise technique, movement at target joints, use of target muscles after mod verbal, visual, tactile cues.      PATIENT EDUCATION:  Education details: POC Person educated: Patient Education method: Explanation Education comprehension: verbalized understanding  HOME EXERCISE PROGRAM: Access Code: [email or web address] URL: [email or web address] Date: 05/19/2023 Prepared by: Emil Glassman  Exercises - Seated Scapular Retraction  - 2 x daily - 7 x weekly - 3 sets - 10 reps - 5 seconds hold - Standing Hip Abduction with Resistance at Ankles and Counter Support  - 1 x daily - 7 x weekly - 3 sets - 10 reps  Yellow band    ASSESSMENT:  CLINICAL IMPRESSION:  Pt demonstrates overall improved TUG, DGI, and 5 times sit to stand since initial evaluation. Pt is making progress with PT towards goals. Slightly slower 5x sit <> stand today compared to previous measurement. Still demonstrates some difficulty with balance during gait with head movement based on subjective reports and clinical observation. Will benefit from continued skilled PT services to improve strength, function, and ability to ambulate while looking around safely and to decrease fall risk.          OBJECTIVE IMPAIRMENTS: Abnormal gait, decreased activity tolerance, decreased balance, difficulty walking, decreased strength, improper body mechanics, and postural  dysfunction.   ACTIVITY LIMITATIONS: carrying, lifting, standing, stairs, transfers, and locomotion level  PARTICIPATION LIMITATIONS:   PERSONAL FACTORS: Age, Fitness, Past/current experiences, and 3+ comorbidities: CA, CAD, HTN, MI, orthostaic hypotension, PAF  are also affecting patient's functional outcome.   REHAB POTENTIAL: Fair    CLINICAL DECISION MAKING: Stable/uncomplicated  EVALUATION COMPLEXITY: Low   GOALS: Goals reviewed with patient? Yes  SHORT TERM GOALS: Target date: 06/06/2023   Pt will be independent with his initial HEP to improve strength, balance, and function.  Baseline: Pt has not yet started his initial HEP (05/14/2023) Goal status:  INITIAL  LONG TERM GOALS: Target date: 07/11/2023  Pt will improve his 5 times sit to stand time to 12 seconds or less without UE assist as a demonstration of improved functional LE strength.  Baseline: 21 seconds with B UE assist (05/14/2023) 07/07/23: 14.93 sec; 16.47 seconds with B UE assist (08/21/2023) Goal status: ONGOING  2.  Pt will improve his TUG time to 12 seconds or less without AD as a demonstration of improved functional mobility and balance. Baseline: Timed up and go (TUG): 14.2 seconds without AD (05/14/2023) 07/07/23: 13.76 without AD; 13.38 seconds average without AD (08/21/2023) Goal status: ONGOING  3.  Pt will improve his Dynamic Gait Index score to 19 or more without AD as a demonstration of improved balance.  Baseline: Dynamic Gait Index: 15 with SPC on R side (05/14/2023) 07/07/23: 23/24 Goal status: MET   4. Pt will improve B hip extension and flexion strength to promote ability to perform transfers as well as negotiate obstacles safely and with less difficulty.  Baseline:  MMT Right 08/21/2023 Left 08/21/2023  Hip flexion 4- 4-  Hip extension (seated manually resisted) 3+ 3+  Hip abduction (seated manually resisted) 5 5    (08/21/2023)  Goal status: ONGOING     PLAN:  PT FREQUENCY: 2x/week  PT DURATION: 4 weeks  PLANNED INTERVENTIONS: Therapeutic exercises, Therapeutic activity, Neuromuscular re-education, Balance training, Gait training, Patient/Family education, Stair training, Vestibular training, Canalith repositioning, Dry Needling, Electrical stimulation, Manual therapy, and Re-evaluation.  PLAN FOR NEXT SESSION:    Functional LE, glute med, max, quadriceps strengthening, balance, manual techniques, modalities PRN.   Emil Glassman PT, DPT Physical Therapist - [facility]  08/21/23, 10:54 AM

## 2023-08-24 NOTE — Progress Notes (Signed)
 Physician Signature: Crawford Givens Date:____01/05/25 ___ Time:__10:57 AM

## 2023-08-26 ENCOUNTER — Ambulatory Visit: Payer: Medicare HMO

## 2023-08-26 DIAGNOSIS — R262 Difficulty in walking, not elsewhere classified: Secondary | ICD-10-CM

## 2023-08-26 DIAGNOSIS — R2681 Unsteadiness on feet: Secondary | ICD-10-CM | POA: Diagnosis not present

## 2023-08-26 DIAGNOSIS — Z9181 History of falling: Secondary | ICD-10-CM

## 2023-08-26 NOTE — Therapy (Signed)
 OUTPATIENT PHYSICAL THERAPY TREATMENT   Patient Name: RAYVION STUMPH MRN: [id number] DOB:Sep 16, 1929, 89 y.o., male Today's Date: 08/26/2023  END OF SESSION:  PT End of Session - 08/26/23 0945     Visit Number 13    Number of Visits 20    Date for PT Re-Evaluation 09/19/23    Authorization Type [employer] Medicare    Progress Note Due on Visit 10    PT Start Time [phone]    PT Stop Time 1029    PT Time Calculation (min) 43 min    Equipment Utilized During Treatment Gait belt    Activity Tolerance Patient tolerated treatment well    Behavior During Therapy WFL for tasks assessed/performed                      Past Medical History:  Diagnosis Date   Cancer (HCC)    Prostate, followed by Dr. Ike   Colon polyp    Coronary artery disease    a. 2005 s/p PCI LAD ([facility]); b 2012 s/p PCI OM2; c.10/2013 Cath: LAD 54m ISR, 69m, D1 90, D2 70, OM2 80 w/ patent stent;  d. 06/2019 Low risk MV; e. 07/2020 MV: EF 77%, no ischemia/infarct.   Gall stones    Hyperlipidemia    Hypertension    Melanoma (HCC)    MI (myocardial infarction) (HCC)    2015   Orthostatic hypotension    a. prev on midodrine /florinef /northera .   PAF (paroxysmal atrial fibrillation) (HCC)    a. on xarelto  and amio (CHA2DS2VASc = 4).   Skin cancer    Past Surgical History:  Procedure Laterality Date   CARDIAC CATHETERIZATION  10/17/2013   [facility]   CATARACT EXTRACTION  05/22/2011   right eye   colonoscopy     COLONOSCOPY W/ POLYPECTOMY  08/19/2013   Dr Jeri   COLONOSCOPY WITH PROPOFOL  N/A 08/15/2016   Procedure: COLONOSCOPY WITH PROPOFOL ;  Surgeon: Ruel Kung, MD;  Location: [facility] ENDOSCOPY;  Service: Endoscopy;  Laterality: N/A;   CORONARY ANGIOPLASTY  08/20/2007   2005; s/p stent   ENTEROSCOPY N/A 09/22/2018   Procedure: ENTEROSCOPY;  Surgeon: Unk Corinn Skiff, MD;  Location: [facility] ENDOSCOPY;  Service: Gastroenterology;  Laterality: N/A;   ESOPHAGOGASTRODUODENOSCOPY (EGD) WITH PROPOFOL  N/A 08/15/2016    Procedure: ESOPHAGOGASTRODUODENOSCOPY (EGD) WITH PROPOFOL ;  Surgeon: Ruel Kung, MD;  Location: [facility] ENDOSCOPY;  Service: Endoscopy;  Laterality: N/A;   GIVENS CAPSULE STUDY N/A 09/26/2016   Procedure: GIVENS CAPSULE STUDY;  Surgeon: Ruel Kung, MD;  Location: [facility] ENDOSCOPY;  Service: Endoscopy;  Laterality: N/A;   HERNIA REPAIR  08/20/2011   LEFT HEART CATH AND CORONARY ANGIOGRAPHY N/A 04/29/2022   Procedure: LEFT HEART CATH AND CORONARY ANGIOGRAPHY;  Surgeon: Darron Deatrice LABOR, MD;  Location: [facility] INVASIVE CV LAB;  Service: Cardiovascular;  Laterality: N/A;   LUMBAR SPINE SURGERY     x2 in [place] in 1990's   UPPER GI ENDOSCOPY  04/19/2014   Dr Jeri   Patient Active Problem List   Diagnosis Date Noted   Advance care planning 07/17/2023   Melanoma of skin (HCC) 07/17/2023   Balance problem 04/30/2023   Compression fracture of T2 vertebra (HCC) 03/05/2023   Cerebral infarction, chronic 03/05/2023   Thyroid  nodule 03/05/2023   Chronic constipation 08/06/2022   PVC's (premature ventricular contractions) 05/07/2022   Neuropathic pain 04/04/2022   Spondylolisthesis, lumbar region 03/14/2022   Hypophosphatemia 09/02/2021   Allergic rhinitis 08/30/2020   Coronary artery disease    GERD (gastroesophageal  reflux disease)    Benign prostatic hyperplasia with lower urinary tract symptoms 08/13/2020   Fall 11/11/2019   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   Osteoarthritis of knees, bilateral 08/31/2019   Orthostatic hypotension 06/06/2018   Chronic pain of left knee 06/05/2018   Chronic back pain 02/17/2018   Angiodysplasia of intestinal tract    Iron deficiency anemia    Diverticulosis of large intestine without diverticulitis    Gastric polyp    Left rotator cuff tear arthropathy 09/11/2015   Fatigue 04/15/2014   Prostate cancer (HCC) 03/15/2013   DOE (dyspnea on exertion) 11/23/2012   Insomnia 09/03/2012   Hypertension 07/23/2012   CKD (chronic kidney disease), stage IIIa  07/23/2011   History of pulmonary embolism 01/09/2011   Paroxysmal atrial fibrillation (HCC) 09/06/2010   Mixed hyperlipidemia 05/19/2009    PCP: Arlyss Solian, MD  REFERRING PROVIDER: Arlyss Solian, MD  REFERRING DIAG: R26.89 (ICD-10-CM) - Balance problem  Rationale for Evaluation and Treatment: Rehabilitation  THERAPY DIAG:  Unsteadiness on feet  Difficulty in walking, not elsewhere classified  History of falling  ONSET DATE: 2-3 months ago  SUBJECTIVE:                                                                                                                                                                                           SUBJECTIVE STATEMENT: Balance is same. No pain or discomfort anywhere. Has arthritis. When he turns he gets a little dizzy. The room does not look like it is spinning. Anytime he makes a quick movement to turn, the rest of his body goes. Has no problem driving. Loses his balance more when the turns to the R once in a while. 90% of the time, there is no problem.    Has a new doctor: Arlyss Solian, MD. Dr. Maribeth left.     PERTINENT HISTORY:  Balance. Has been using a SPC for about 8-9 months and a rw at night for about 5-6 months. Has had difficulty with his balance about 2-3 months ago and has not improved. Had 2 back operations years ago. Took a bad fall many years ago and screwed up both knees. Went to the ER about month ago and had chest and upper back pain with sweating. Has happened several times before. Has had 2 stents and one heart attack. Can have CPR performed if needed.   Feels like losing his balance when he turns while talking or doing something.   No latex allergies  Blood pressure is pretty much controlled per pt.    PAIN:  Are you having pain? No  PRECAUTIONS: Fall, Has nitroglycerin  for  his heart  RED FLAGS: Bowel or bladder incontinence: No and Cauda equina syndrome: No   WEIGHT BEARING RESTRICTIONS:  No  FALLS:  Has patient fallen in last 6 months? Yes. Number of falls 1  At night. Uses a walker to go to the bathroom and his R foot got caught at the edge of the walker.   LIVING ENVIRONMENT: Lives with: lives with their spouse Lives in: House/apartment Stairs: No Has following equipment at home: Single point cane, Environmental Consultant - 2 wheeled, and Grab bars  OCCUPATION: retired  PLOF: Independent with household mobility with device  PATIENT GOALS: Walk better.   NEXT MD VISIT: none  OBJECTIVE:     LOWER EXTREMITY MMT:    MMT Right 08/21/2023 Left 08/21/2023  Hip flexion 4- 4-  Hip extension (seated manually resisted) 3+ 3+  Hip abduction (seated manually resisted) 5 5  Hip adduction    Hip internal rotation    Hip external rotation    Knee flexion 5 4  Knee extension 5 5  Ankle dorsiflexion    Ankle plantarflexion    Ankle inversion    Ankle eversion     (Blank rows = not tested)     TODAY'S TREATMENT: DATE: 08/26/23   TherEx:  Trenda hallpike  R negative  L negative  Supine B scapular retraction to promote thoracic extension 10x3 with 5 second holds  Recline cervical nod 10x5 seconds for 3 sets  Standing B scapular retraction with shoulder extension yellow band 10x2 with 5 second holds  Seated chin tucks 10x3 with 5 second holds  Seated L cervical side bend to stretch R side 30 seconds x 3    Improved exercise technique, movement at target joints, use of target muscles after mod verbal, visual, tactile cues.     PATIENT EDUCATION:  Education details: POC Person educated: Patient Education method: Explanation Education comprehension: verbalized understanding  HOME EXERCISE PROGRAM: Access Code: [email or web address] URL: [email or web address] Date: 05/19/2023 Prepared by: Emil Glassman  Exercises - Seated Scapular Retraction  - 2 x daily - 7 x weekly - 3 sets - 10 reps - 5 seconds hold - Standing Hip Abduction with Resistance at Ankles and  Counter Support  - 1 x daily - 7 x weekly - 3 sets - 10 reps  Yellow band - Supine Deep Neck Flexor Nods  - 1 x daily - 7 x weekly - 3 sets - 10 reps - 5 seconds hold - Seated Neck Sidebending Stretch  - 3 x daily - 7 x weekly - 1 sets - 5 reps - 30 seconds hold  ASSESSMENT:  CLINICAL IMPRESSION:   Focused predominantly on improving upper thoracic and lower cervical mobility secondary to kyphotic posture with possible cervicogenic dizziness. Assessed Trenda Craze secondary to reports of losing balance when turning which was negative bilaterally. Also worked on anterior cervical muscle strengthening secondary to cervical paraspinal muscle tension. Pt tolerated session well without aggravation of symptoms. Pt will benefit from continued skilled physical therapy services to improve strength, balance, and function.        OBJECTIVE IMPAIRMENTS: Abnormal gait, decreased activity tolerance, decreased balance, difficulty walking, decreased strength, improper body mechanics, and postural dysfunction.   ACTIVITY LIMITATIONS: carrying, lifting, standing, stairs, transfers, and locomotion level  PARTICIPATION LIMITATIONS:   PERSONAL FACTORS: Age, Fitness, Past/current experiences, and 3+ comorbidities: CA, CAD, HTN, MI, orthostaic hypotension, PAF  are also affecting patient's functional outcome.   REHAB POTENTIAL: Fair    CLINICAL DECISION MAKING: Stable/uncomplicated  EVALUATION  COMPLEXITY: Low   GOALS: Goals reviewed with patient? Yes  SHORT TERM GOALS: Target date: 06/06/2023   Pt will be independent with his initial HEP to improve strength, balance, and function.  Baseline: Pt has not yet started his initial HEP (05/14/2023) Goal status: INITIAL  LONG TERM GOALS: Target date: 07/11/2023  Pt will improve his 5 times sit to stand time to 12 seconds or less without UE assist as a demonstration of improved functional LE strength.  Baseline: 21 seconds with B UE assist  (05/14/2023) 07/07/23: 14.93 sec; 16.47 seconds with B UE assist (08/21/2023) Goal status: ONGOING  2.  Pt will improve his TUG time to 12 seconds or less without AD as a demonstration of improved functional mobility and balance. Baseline: Timed up and go (TUG): 14.2 seconds without AD (05/14/2023) 07/07/23: 13.76 without AD; 13.38 seconds average without AD (08/21/2023) Goal status: ONGOING  3.  Pt will improve his Dynamic Gait Index score to 19 or more without AD as a demonstration of improved balance.  Baseline: Dynamic Gait Index: 15 with SPC on R side (05/14/2023) 07/07/23: 23/24 Goal status: MET   4. Pt will improve B hip extension and flexion strength to promote ability to perform transfers as well as negotiate obstacles safely and with less difficulty.  Baseline:  MMT Right 08/21/2023 Left 08/21/2023  Hip flexion 4- 4-  Hip extension (seated manually resisted) 3+ 3+  Hip abduction (seated manually resisted) 5 5    (08/21/2023)  Goal status: ONGOING     PLAN:  PT FREQUENCY: 2x/week  PT DURATION: 4 weeks  PLANNED INTERVENTIONS: Therapeutic exercises, Therapeutic activity, Neuromuscular re-education, Balance training, Gait training, Patient/Family education, Stair training, Vestibular training, Canalith repositioning, Dry Needling, Electrical stimulation, Manual therapy, and Re-evaluation.  PLAN FOR NEXT SESSION:    Functional LE, glute med, max, quadriceps strengthening, balance, manual techniques, modalities PRN.   Emil Glassman PT, DPT Physical Therapist - [facility]  08/26/23, 10:37 AM

## 2023-08-27 ENCOUNTER — Ambulatory Visit: Payer: Medicare HMO | Admitting: Urology

## 2023-08-27 ENCOUNTER — Encounter: Payer: Self-pay | Admitting: Urology

## 2023-08-27 VITALS — BP 123/75 | HR 90 | Ht 76.0 in | Wt 185.0 lb

## 2023-08-27 DIAGNOSIS — C61 Malignant neoplasm of prostate: Secondary | ICD-10-CM | POA: Diagnosis not present

## 2023-08-27 DIAGNOSIS — N401 Enlarged prostate with lower urinary tract symptoms: Secondary | ICD-10-CM

## 2023-08-27 NOTE — Progress Notes (Signed)
 I, Maysun LITTIE Griffiths, acting as a scribe for Glendia JAYSON Barba, MD., have documented all relevant documentation on the behalf of Glendia JAYSON Barba, MD, as directed by Glendia JAYSON Barba, MD while in the presence of Glendia JAYSON Barba, MD.  08/27/2023 2:04 PM   William Taylor 09-24-1929 [id number]  Referring provider: Maribeth Camellia MATSU, MD [address] [place],  [place] [address]  Chief Complaint  Patient presents with   Benign Prostatic Hypertrophy   Urologic history: 1. 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 Ike; PSA 5.7; 77 g   focus Gleason 3+3 RLB Elected active surveillance; started finasteride  Baseline PSA 0.1-0.6 since 2015  HPI: William Taylor is a 89 y.o. male presents for annual follow-up.   No significant change in his voiding pattern since last year's visit. He remains on finasteride  and presently has no bothersome lower urinary tract symptoms.  Denies dysuria, gross hematuria.  No flank, abdominal or pelvic pain.   PMH: Past Medical History:  Diagnosis Date   Cancer [facility])    Prostate, followed by Dr. Ike   Colon polyp    Coronary artery disease    a. 2005 s/p PCI LAD ([facility]); b 2012 s/p PCI OM2; c.10/2013 Cath: LAD 1m ISR, 43m, D1 90, D2 70, OM2 80 w/ patent stent;  d. 06/2019 Low risk MV; e. 07/2020 MV: EF 77%, no ischemia/infarct.   Gall stones    Hyperlipidemia    Hypertension    Melanoma (HCC)    MI (myocardial infarction) (HCC)    2015   Orthostatic hypotension    a. prev on midodrine /florinef /northera .   PAF (paroxysmal atrial fibrillation) (HCC)    a. on xarelto  and amio (CHA2DS2VASc = 4).   Skin cancer     Surgical History: Past Surgical History:  Procedure Laterality Date   CARDIAC CATHETERIZATION  [facility]/17/2013   [facility]   CATARACT EXTRACTION  05/22/2011   right eye   colonoscopy     COLONOSCOPY W/ POLYPECTOMY  08/19/2013   Dr Jeri   COLONOSCOPY WITH PROPOFOL  N/A 08/15/2016   Procedure: COLONOSCOPY  WITH PROPOFOL ;  Surgeon: Ruel Kung, MD;  Location: [facility] ENDOSCOPY;  Service: Endoscopy;  Laterality: N/A;   CORONARY ANGIOPLASTY  08/20/2007   2005; s/p stent   ENTEROSCOPY N/A 09/22/2018   Procedure: ENTEROSCOPY;  Surgeon: Unk Corinn Skiff, MD;  Location[facility] ENDOSCOPY;  Service: Gastroenterology;  Laterality: N/A;   ESOPHAGOGASTRODUODENOSCOPY (EGD) WITH PROPOFOL  N/A 08/15/2016   Procedure: ESOPHAGOGASTRODUODENOSCOPY (EGD) WITH PROPOFOL ;  Surgeon: Ruel Kung, MD;  Location: [facility] ENDOSCOPY;  Service: Endoscopy;  Laterality: N/A;   GIVENS CAPSULE STUDY N/A 09/26/2016   Procedure: GIVENS CAPSULE STUDY;  Surgeon: Ruel Kung, MD;  Location: [facility] ENDOSCOPY;  Service: Endoscopy;  Laterality: N/A;   HERNIA REPAIR  08/20/2011   LEFT HEART CATH AND CORONARY ANGIOGRAPHY N/A 04/29/2022   Procedure: LEFT HEART CATH AND CORONARY ANGIOGRAPHY;  Surgeon: Darron Deatrice LABOR, MD;  Location: [facility] INVASIVE CV LAB;  Service: Cardiovascular;  Laterality: N/A;   LUMBAR SPINE SURGERY     x2 in [facility] in 1990's   UPPER GI ENDOSCOPY  04/19/2014   Dr Jeri    Home Medications:  Allergies as of 08/27/2023       Reactions   Bee Venom Swelling   Sulfasalazine Hives, Swelling   Sulfa Antibiotics Hives, Swelling   Warfarin    Warfarin And Related Other (See Comments)   Chest pain  Medication List        Accurate as of August 27, 2023  2:04 PM. If you have any questions, ask your nurse or doctor.          amiodarone  100 MG tablet Commonly known as: PACERONE  TAKE 1 TABLET BY MOUTH DAILY   cholecalciferol  25 MCG (1000 UNIT) tablet Commonly known as: VITAMIN D3 Take 1,000 Units by mouth daily.   fenofibrate  micronized 130 MG capsule Commonly known as: ANTARA  TAKE 1 CAPSULE BY MOUTH DAILY  BEFORE BREAKFAST   finasteride  5 MG tablet Commonly known as: PROSCAR  TAKE 1 TABLET BY MOUTH DAILY   levobunolol  0.5 % ophthalmic solution Commonly known as: BETAGAN  Place 1 drop into the right eye 2  (two) times daily.   lisinopril  5 MG tablet Commonly known as: ZESTRIL  Take 1 tablet (5 mg total) by mouth daily.   loratadine  10 MG tablet Commonly known as: CLARITIN  Take 1 tablet (10 mg total) by mouth daily.   MULTIVITAMIN PO Take 1 tablet by mouth daily.   nitroGLYCERIN  0.4 MG SL tablet Commonly known as: NITROSTAT  DISSOLVE UNDER THE TONGUE 1 TABLET EVERY 5 MINUTES AS NEEDED FOR CHEST PAIN   pantoprazole  40 MG tablet Commonly known as: PROTONIX  TAKE ONE (1) TABLET BY MOUTH TWO TIMES PER DAY   pravastatin  40 MG tablet Commonly known as: PRAVACHOL  TAKE 1 TABLET BY MOUTH AT  BEDTIME   Xarelto  15 MG Tabs tablet Generic drug: Rivaroxaban  TAKE 1 TABLET BY MOUTH DAILY        Allergies:  Allergies  Allergen Reactions   Bee Venom Swelling   Sulfasalazine Hives and Swelling   Sulfa Antibiotics Hives and Swelling   Warfarin    Warfarin And Related Other (See Comments)    Chest pain    Family History: Family History  Problem Relation Age of Onset   Stroke Mother    Colon cancer Father    Coronary artery disease Brother    Other Brother 56       lymes dz    Social History:  reports that he has never smoked. He has never used smokeless tobacco. He reports that he does not drink alcohol and does not use drugs.   Physical Exam: BP 123/75   Pulse 90   Ht 6' 4 (1.93 m)   Wt 185 lb (83.9 kg)   BMI 22.52 kg/m   Constitutional:  Alert and oriented, No acute distress. HEENT: [place] AT Respiratory: Normal respiratory effort, no increased work of breathing. Psychiatric: Normal mood and affect.   Assessment & Plan:    1. BPH with LUTS Stable Finasteride  refilled Continue annual follow-up  2. Low-risk prostate cancer PSA testing has been discontinued.  I have reviewed the above documentation for accuracy and completeness, and I agree with the above.   Glendia JAYSON Barba, MD  [facility] [address], [address] [place], [place]  [address] [phone]

## 2023-08-28 ENCOUNTER — Ambulatory Visit: Payer: Medicare HMO

## 2023-08-28 DIAGNOSIS — R2681 Unsteadiness on feet: Secondary | ICD-10-CM

## 2023-08-28 DIAGNOSIS — Z9181 History of falling: Secondary | ICD-10-CM

## 2023-08-28 DIAGNOSIS — R262 Difficulty in walking, not elsewhere classified: Secondary | ICD-10-CM

## 2023-08-28 NOTE — Therapy (Signed)
 OUTPATIENT PHYSICAL THERAPY TREATMENT   Patient Name: William Taylor MRN: [id number] DOB:1930/03/03, 89 y.o., male Today's Date: 08/28/2023  END OF SESSION:  PT End of Session - 08/28/23 0950     Visit Number 14    Number of Visits 20    Date for PT Re-Evaluation 09/19/23    Authorization Type [employer]    Progress Note Due on Visit 10    PT Start Time 0950    PT Stop Time 1032    PT Time Calculation (min) 42 min    Equipment Utilized During Treatment Gait belt    Activity Tolerance Patient tolerated treatment well    Behavior During Therapy WFL for tasks assessed/performed                       Past Medical History:  Diagnosis Date   Cancer (HCC)    Prostate, followed by Dr. Ike   Colon polyp    Coronary artery disease    a. 2005 s/p PCI LAD ([facility]); b 2012 s/p PCI OM2; c.10/2013 Cath: LAD 53m ISR, 43m, D1 90, D2 70, OM2 80 w/ patent stent;  d. 06/2019 Low risk MV; e. 07/2020 MV: EF 77%, no ischemia/infarct.   Gall stones    Hyperlipidemia    Hypertension    Melanoma (HCC)    MI (myocardial infarction) (HCC)    2015   Orthostatic hypotension    a. prev on midodrine /florinef /northera .   PAF (paroxysmal atrial fibrillation) (HCC)    a. on xarelto  and amio (CHA2DS2VASc = 4).   Skin cancer    Past Surgical History:  Procedure Laterality Date   CARDIAC [facility]IZATION  [facility]17/2013   [facility]   CATARACT EXTRACTION  05/22/2011   right eye   colonoscopy     COLONOSCOPY W/ POLYPECTOMY  08/19/2013   Dr Jeri   COLONOSCOPY WITH PROPOFOL  N/A 08/15/2016   Procedure: COLONOSCOPY WITH PROPOFOL ;  Surgeon: Ruel Kung, MD;  Location: [facility] ENDOSCOPY;  Service: Endoscopy;  Laterality: N/A;   CORONARY ANGIOPLASTY  08/20/2007   2005; s/p stent   ENTEROSCOPY N/A 09/22/2018   Procedure: ENTEROSCOPY;  Surgeon: Unk Corinn Skiff, MD;  Location: [facility] ENDOSCOPY;  Service: Gastroenterology;  Laterality: N/A;   ESOPHAGOGASTRODUODENOSCOPY (EGD) WITH PROPOFOL  N/A 08/15/2016    Procedure: ESOPHAGOGASTRODUODENOSCOPY (EGD) WITH PROPOFOL ;  Surgeon: Ruel Kung, MD;  Location: [facility] ENDOSCOPY;  Service: Endoscopy;  Laterality: N/A;   GIVENS CAPSULE STUDY N/A 09/26/2016   Procedure: GIVENS CAPSULE STUDY;  Surgeon: Ruel Kung, MD;  Location: [facility] ENDOSCOPY;  Service: Endoscopy;  Laterality: N/A;   HERNIA REPAIR  08/20/2011   LEFT HEART CATH AND CORONARY ANGIOGRAPHY N/A 04/29/2022   Procedure: LEFT HEART CATH AND CORONARY ANGIOGRAPHY;  Surgeon: Darron Deatrice LABOR, MD;  Location: [facility] INVASIVE CV LAB;  Service: Cardiovascular;  Laterality: N/A;   LUMBAR SPINE SURGERY     x2 in [place] in 1990's   UPPER GI ENDOSCOPY  04/19/2014   Dr Jeri   Patient Active Problem List   Diagnosis Date Noted   Advance care planning 07/17/2023   Melanoma of skin (HCC) 07/17/2023   Balance problem 04/30/2023   Compression fracture of T2 vertebra (HCC) 03/05/2023   Cerebral infarction, chronic 03/05/2023   Thyroid  nodule 03/05/2023   Chronic constipation 08/06/2022   PVC's (premature ventricular contractions) 05/07/2022   Neuropathic pain 04/04/2022   Spondylolisthesis, lumbar region 03/14/2022   Hypophosphatemia 09/02/2021   Allergic rhinitis 08/30/2020   Coronary artery disease    GERD (  gastroesophageal reflux disease)    Benign prostatic hyperplasia with lower urinary tract symptoms 08/13/2020   Fall 11/11/2019   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   Osteoarthritis of knees, bilateral 08/31/2019   Orthostatic hypotension 06/06/2018   Chronic pain of left knee 06/05/2018   Chronic back pain 02/17/2018   Angiodysplasia of intestinal tract    Iron deficiency anemia    Diverticulosis of large intestine without diverticulitis    Gastric polyp    Left rotator cuff tear arthropathy 09/11/2015   Fatigue 04/15/2014   Prostate cancer (HCC) 03/15/2013   DOE (dyspnea on exertion) 11/23/2012   Insomnia 09/03/2012   Hypertension 07/23/2012   CKD (chronic kidney disease), stage IIIa  07/23/2011   History of pulmonary embolism 01/09/2011   Paroxysmal atrial fibrillation (HCC) 09/06/2010   Mixed hyperlipidemia 05/19/2009    PCP: Arlyss Solian, MD  REFERRING PROVIDER: Arlyss Solian, MD  REFERRING DIAG: R26.89 (ICD-10-CM) - Balance problem  Rationale for Evaluation and Treatment: Rehabilitation  THERAPY DIAG:  Unsteadiness on feet  Difficulty in walking, not elsewhere classified  History of falling  ONSET DATE: 2-3 months ago  SUBJECTIVE:                                                                                                                                                                                           SUBJECTIVE STATEMENT: Did a few exercises but was not able to due to computer and phone and car problems he needs to take care of. Was fine after last session.     Has a new doctor: Arlyss Solian, MD. Dr. Maribeth left.     PERTINENT HISTORY:  Balance. Has been using a SPC for about 8-9 months and a rw at night for about 5-6 months. Has had difficulty with his balance about 2-3 months ago and has not improved. Had 2 back operations years ago. Took a bad fall many years ago and screwed up both knees. Went to the ER about month ago and had chest and upper back pain with sweating. Has happened several times before. Has had 2 stents and one heart attack. Can have CPR performed if needed.   Feels like losing his balance when he turns while talking or doing something.   No latex allergies  Blood pressure is pretty much controlled per pt.    PAIN:  Are you having pain? No  PRECAUTIONS: Fall, Has nitroglycerin  for his heart  RED FLAGS: Bowel or bladder incontinence: No and Cauda equina syndrome: No   WEIGHT BEARING RESTRICTIONS: No  FALLS:  Has patient fallen in last 6 months? Yes. Number of falls 1  At night. Uses a walker to go to the bathroom and his R foot got caught at the edge of the walker.   LIVING ENVIRONMENT: Lives with:  lives with their spouse Lives in: House/apartment Stairs: No Has following equipment at home: Single point cane, Environmental Consultant - 2 wheeled, and Grab bars  OCCUPATION: retired  PLOF: Independent with household mobility with device  PATIENT GOALS: Walk better.   NEXT MD VISIT: none  OBJECTIVE:     LOWER EXTREMITY MMT:    MMT Right 08/21/2023 Left 08/21/2023  Hip flexion 4- 4-  Hip extension (seated manually resisted) 3+ 3+  Hip abduction (seated manually resisted) 5 5  Hip adduction    Hip internal rotation    Hip external rotation    Knee flexion 5 4  Knee extension 5 5  Ankle dorsiflexion    Ankle plantarflexion    Ankle inversion    Ankle eversion     (Blank rows = not tested)     TODAY'S TREATMENT: DATE: 08/28/23   TherEx:   reclined B scapular retraction to promote thoracic extension 10x2 with 5 second holds  Reclined cervical nod 10x5 seconds for 2 sets  Standing static mini lunge with contralateral UE assist R 10x L 10x  Standing hip extension at Matrix, 40#, 2x10 each LE; height 9  Stepping over 5 mini hurdles CGA with SPC assist 4x. Cues for placing body weight onto stance foot. Pt demonstrates tendency for backwards lean  SLS with contralateral UE assist   R 15 seconds x 3  L 15 seconds x 3  Standing forward weight shifting onto fore feet with eyes open, B UE assist PRN 10x5 seconds       Improved exercise technique, movement at target joints, use of target muscles after mod verbal, visual, tactile cues.     PATIENT EDUCATION:  Education details: POC Person educated: Patient Education method: Explanation Education comprehension: verbalized understanding  HOME EXERCISE PROGRAM: Access Code: [email or web address] URL: [email or web address] Date: 05/19/2023 Prepared by: Emil Glassman  Exercises - Seated Scapular Retraction  - 2 x daily - 7 x weekly - 3 sets - 10 reps - 5 seconds hold - Standing Hip Abduction with Resistance at Ankles and  Counter Support  - 1 x daily - 7 x weekly - 3 sets - 10 reps  Yellow band - Supine Deep Neck Flexor Nods  - 1 x daily - 7 x weekly - 3 sets - 10 reps - 5 seconds hold - Seated Neck Sidebending Stretch  - 3 x daily - 7 x weekly - 1 sets - 5 reps - 30 seconds hold  ASSESSMENT:  CLINICAL IMPRESSION:  Continued working on improving upper thoracic and lower cervical mobility secondary to kyphotic posture with possible cervicogenic dizziness. Continued working on glute and LE strengthening to promote ability to support himself with one LE when negotiating tasks. Demonstrates tendency for backwards lean when stepping over obstacles, cues needed for forward weight shifting onto stance foot to place his center of gravity better onto his base of support. Pt will benefit from continued skilled physical therapy services to improve strength, balance, and function.        OBJECTIVE IMPAIRMENTS: Abnormal gait, decreased activity tolerance, decreased balance, difficulty walking, decreased strength, improper body mechanics, and postural dysfunction.   ACTIVITY LIMITATIONS: carrying, lifting, standing, stairs, transfers, and locomotion level  PARTICIPATION LIMITATIONS:   PERSONAL FACTORS: Age, Fitness, Past/current experiences, and 3+ comorbidities: CA, CAD, HTN, MI, orthostaic hypotension, PAF  are also  affecting patient's functional outcome.   REHAB POTENTIAL: Fair    CLINICAL DECISION MAKING: Stable/uncomplicated  EVALUATION COMPLEXITY: Low   GOALS: Goals reviewed with patient? Yes  SHORT TERM GOALS: Target date: 06/06/2023   Pt will be independent with his initial HEP to improve strength, balance, and function.  Baseline: Pt has not yet started his initial HEP (05/14/2023) Goal status: INITIAL  LONG TERM GOALS: Target date: 07/11/2023  Pt will improve his 5 times sit to stand time to 12 seconds or less without UE assist as a demonstration of improved functional LE strength.  Baseline: 21  seconds with B UE assist (05/14/2023) 07/07/23: 14.93 sec; 16.47 seconds with B UE assist (08/21/2023) Goal status: ONGOING  2.  Pt will improve his TUG time to 12 seconds or less without AD as a demonstration of improved functional mobility and balance. Baseline: Timed up and go (TUG): 14.2 seconds without AD (05/14/2023) 07/07/23: 13.76 without AD; 13.38 seconds average without AD (08/21/2023) Goal status: ONGOING  3.  Pt will improve his Dynamic Gait Index score to 19 or more without AD as a demonstration of improved balance.  Baseline: Dynamic Gait Index: 15 with SPC on R side (05/14/2023) 07/07/23: 23/24 Goal status: MET   4. Pt will improve B hip extension and flexion strength to promote ability to perform transfers as well as negotiate obstacles safely and with less difficulty.  Baseline:  MMT Right 08/21/2023 Left 08/21/2023  Hip flexion 4- 4-  Hip extension (seated manually resisted) 3+ 3+  Hip abduction (seated manually resisted) 5 5    (08/21/2023)  Goal status: ONGOING     PLAN:  PT FREQUENCY: 2x/week  PT DURATION: 4 weeks  PLANNED INTERVENTIONS: Therapeutic exercises, Therapeutic activity, Neuromuscular re-education, Balance training, Gait training, Patient/Family education, Stair training, Vestibular training, Canalith repositioning, Dry Needling, Electrical stimulation, Manual therapy, and Re-evaluation.  PLAN FOR NEXT SESSION:    Functional LE, glute med, max, quadriceps strengthening, balance, manual techniques, modalities PRN.      Emil Glassman PT, DPT Physical Therapist - [facility]  08/28/23, 10:42 AM

## 2023-09-02 ENCOUNTER — Ambulatory Visit: Payer: Medicare HMO

## 2023-09-04 ENCOUNTER — Ambulatory Visit: Payer: Medicare HMO

## 2023-09-05 ENCOUNTER — Ambulatory Visit: Payer: Medicare HMO | Admitting: Family Medicine

## 2023-09-22 ENCOUNTER — Encounter: Payer: Self-pay | Admitting: Ophthalmology

## 2023-09-23 ENCOUNTER — Encounter: Payer: Self-pay | Admitting: Ophthalmology

## 2023-09-23 NOTE — Anesthesia Preprocedure Evaluation (Addendum)
Anesthesia Evaluation  Patient identified by MRN, date of birth, ID band Patient awake    Reviewed: Allergy & Precautions, H&P , NPO status , Patient's Chart, lab work & pertinent test results  Airway Mallampati: III  TM Distance: >3 FB Neck ROM: Full    Dental no notable dental hx. (+) Partial Upper, Partial Lower   Pulmonary neg pulmonary ROS   Pulmonary exam normal breath sounds clear to auscultation       Cardiovascular hypertension, + CAD, + Past MI and + DOE  Normal cardiovascular exam Rhythm:Regular Rate:Normal  04-29-22   Prox RCA lesion is 40% stenosed.   Mid RCA to Dist RCA lesion is 30% stenosed.   3rd Mrg lesion is 10% stenosed.   Dist LAD lesion is 60% stenosed.   Mid LAD lesion is 20% stenosed.   Mid LAD to Dist LAD lesion is 20% stenosed.   Prox LAD to Mid LAD lesion is 20% stenosed.   1.  Widely patent LAD and OM3 stents with minimal restenosis.  Moderately calcified coronary arteries with mild to moderate nonobstructive coronary artery disease. 2.  Left ventricular angiography was not performed due to chronic kidney disease.  LVEDP was mildly elevated.  05-22-23   1. Left ventricular ejection fraction, by estimation, is 60 to 65%. The  left ventricle has normal function. The left ventricle has no regional  wall motion abnormalities. Left ventricular diastolic parameters are  consistent with Grade I diastolic  dysfunction (impaired relaxation).   2. Right ventricular systolic function is normal. The right ventricular  size is normal.   3. The mitral valve is normal in structure. No evidence of mitral valve  regurgitation. No evidence of mitral stenosis.   4. The aortic valve is tricuspid. Aortic valve regurgitation is not  visualized. No aortic stenosis is present. Aortic valve mean gradient  measures 5.0 mmHg.   5. There is mild dilatation of the aortic root, measuring 40 mm. There is  mild  dilatation of the ascending aorta, measuring 41 mm.   6. The inferior vena cava is normal in size with greater than 50%  respiratory variability, suggesting right atrial pressure of 3 mmHg.       Neuro/Psych negative neurological ROS  negative psych ROS   GI/Hepatic negative GI ROS, Neg liver ROS,GERD  ,,  Endo/Other  negative endocrine ROS    Renal/GU Renal diseasenegative Renal ROS  negative genitourinary   Musculoskeletal negative musculoskeletal ROS (+) Arthritis ,    Abdominal   Peds negative pediatric ROS (+)  Hematology negative hematology ROS (+) Blood dyscrasia, anemia   Anesthesia Other Findings Coronary artery disease  Hypertension Hyperlipidemia  PAF (paroxysmal atrial fibrillation) (HCC) Cancer (HCC) MI (myocardial infarction) (HCC) Skin cancer  Colon polyp Gall stones  Orthostatic hypotension Melanoma (HCC)  Pulmonary embolism (HCC) Wears hearing aid in both ears  Wears dentures GERD (gastroesophageal reflux disease) CKD (chronic kidney disease), stage III (HCC)    Reproductive/Obstetrics negative OB ROS                             Anesthesia Physical Anesthesia Plan  ASA: 3  Anesthesia Plan: MAC   Post-op Pain Management:    Induction: Intravenous  PONV Risk Score and Plan:   Airway Management Planned: Natural Airway and Nasal Cannula  Additional Equipment:   Intra-op Plan:   Post-operative Plan:   Informed Consent: I have reviewed the patients History and Physical, chart, labs  and discussed the procedure including the risks, benefits and alternatives for the proposed anesthesia with the patient or authorized representative who has indicated his/her understanding and acceptance.     Dental Advisory Given  Plan Discussed with: Anesthesiologist, CRNA and Surgeon  Anesthesia Plan Comments: (Patient consented for risks of anesthesia including but not limited to:  - adverse reactions to medications -  damage to eyes, teeth, lips or other oral mucosa - nerve damage due to positioning  - sore throat or hoarseness - Damage to heart, brain, nerves, lungs, other parts of body or loss of life  Patient voiced understanding and assent.)       Anesthesia Quick Evaluation

## 2023-09-29 ENCOUNTER — Other Ambulatory Visit: Payer: Medicare HMO

## 2023-09-29 ENCOUNTER — Ambulatory Visit: Payer: Medicare HMO | Admitting: Oncology

## 2023-09-29 NOTE — Discharge Instructions (Signed)

## 2023-10-01 ENCOUNTER — Other Ambulatory Visit: Payer: Self-pay

## 2023-10-01 ENCOUNTER — Encounter
Admission: RE | Disposition: A | Discharge status: Home / Self Care | Payer: Self-pay | Source: Home / Self Care | Attending: Ophthalmology

## 2023-10-01 ENCOUNTER — Ambulatory Visit: Payer: Medicare HMO | Admitting: Anesthesiology

## 2023-10-01 ENCOUNTER — Ambulatory Visit
Admission: RE | Admit: 2023-10-01 | Discharge: 2023-10-01 | Disposition: A | Discharge status: Home / Self Care | Payer: Medicare HMO | Attending: Ophthalmology | Admitting: Ophthalmology

## 2023-10-01 ENCOUNTER — Encounter: Payer: Self-pay | Admitting: Ophthalmology

## 2023-10-01 DIAGNOSIS — I129 Hypertensive chronic kidney disease with stage 1 through stage 4 chronic kidney disease, or unspecified chronic kidney disease: Secondary | ICD-10-CM | POA: Insufficient documentation

## 2023-10-01 DIAGNOSIS — N183 Chronic kidney disease, stage 3 unspecified: Secondary | ICD-10-CM | POA: Insufficient documentation

## 2023-10-01 DIAGNOSIS — H2512 Age-related nuclear cataract, left eye: Secondary | ICD-10-CM | POA: Insufficient documentation

## 2023-10-01 DIAGNOSIS — I2510 Atherosclerotic heart disease of native coronary artery without angina pectoris: Secondary | ICD-10-CM | POA: Diagnosis not present

## 2023-10-01 DIAGNOSIS — K219 Gastro-esophageal reflux disease without esophagitis: Secondary | ICD-10-CM | POA: Insufficient documentation

## 2023-10-01 DIAGNOSIS — I252 Old myocardial infarction: Secondary | ICD-10-CM | POA: Diagnosis not present

## 2023-10-01 DIAGNOSIS — Z8249 Family history of ischemic heart disease and other diseases of the circulatory system: Secondary | ICD-10-CM | POA: Diagnosis not present

## 2023-10-01 HISTORY — DX: Presence of external hearing-aid: Z97.4

## 2023-10-01 HISTORY — DX: Presence of dental prosthetic device (complete) (partial): Z97.2

## 2023-10-01 HISTORY — DX: Gastro-esophageal reflux disease without esophagitis: K21.9

## 2023-10-01 HISTORY — DX: Chronic kidney disease, stage 3 unspecified: N18.30

## 2023-10-01 HISTORY — DX: Other ill-defined heart diseases: I51.89

## 2023-10-01 HISTORY — DX: Other pulmonary embolism without acute cor pulmonale: I26.99

## 2023-10-01 HISTORY — PX: CATARACT EXTRACTION W/PHACO: SHX586

## 2023-10-01 HISTORY — DX: Presence of coronary angioplasty implant and graft: Z95.5

## 2023-10-01 SURGERY — PHACOEMULSIFICATION, CATARACT, WITH IOL INSERTION
Anesthesia: Monitor Anesthesia Care | Laterality: Left

## 2023-10-01 MED ORDER — MIDAZOLAM HCL 2 MG/2ML IJ SOLN
INTRAMUSCULAR | Status: AC
  Filled 2023-10-01: qty 2

## 2023-10-01 MED ORDER — SIGHTPATH DOSE#1 BSS IO SOLN
INTRAOCULAR | Status: DC | PRN
  Administered 2023-10-01: 94 mL via OPHTHALMIC

## 2023-10-01 MED ORDER — CEFUROXIME OPHTHALMIC INJECTION 1 MG/0.1 ML
INJECTION | OPHTHALMIC | Status: DC | PRN
  Administered 2023-10-01: 1 mg via INTRACAMERAL

## 2023-10-01 MED ORDER — TETRACAINE HCL 0.5 % OP SOLN
1.0000 [drp] | OPHTHALMIC | Status: DC | PRN
  Administered 2023-10-01 (×4): 1 [drp] via OPHTHALMIC

## 2023-10-01 MED ORDER — FENTANYL CITRATE (PF) 100 MCG/2ML IJ SOLN
INTRAMUSCULAR | Status: AC
  Filled 2023-10-01: qty 2

## 2023-10-01 MED ORDER — BRIMONIDINE TARTRATE-TIMOLOL 0.2-0.5 % OP SOLN
OPHTHALMIC | Status: DC | PRN
  Administered 2023-10-01: 1 [drp] via OPHTHALMIC

## 2023-10-01 MED ORDER — SIGHTPATH DOSE#1 NA HYALUR & NA CHOND-NA HYALUR IO KIT
PACK | INTRAOCULAR | Status: DC | PRN
  Administered 2023-10-01: 1 via OPHTHALMIC

## 2023-10-01 MED ORDER — FENTANYL CITRATE (PF) 100 MCG/2ML IJ SOLN
INTRAMUSCULAR | Status: DC | PRN
  Administered 2023-10-01: 50 ug via INTRAVENOUS

## 2023-10-01 MED ORDER — ARMC OPHTHALMIC DILATING DROPS
1.0000 | OPHTHALMIC | Status: DC | PRN
  Administered 2023-10-01 (×2): 1 via OPHTHALMIC

## 2023-10-01 MED ORDER — MIDAZOLAM HCL 2 MG/2ML IJ SOLN
INTRAMUSCULAR | Status: DC | PRN
  Administered 2023-10-01: .5 mg via INTRAVENOUS

## 2023-10-01 MED ORDER — SIGHTPATH DOSE#1 BSS IO SOLN
INTRAOCULAR | Status: DC | PRN
  Administered 2023-10-01: 15 mL via INTRAOCULAR

## 2023-10-01 MED ORDER — ARMC OPHTHALMIC DILATING DROPS
OPHTHALMIC | Status: AC
  Filled 2023-10-01: qty 0.5

## 2023-10-01 MED ORDER — TETRACAINE HCL 0.5 % OP SOLN
OPHTHALMIC | Status: AC
  Filled 2023-10-01: qty 4

## 2023-10-01 MED ORDER — SIGHTPATH DOSE#1 BSS IO SOLN
INTRAOCULAR | Status: DC | PRN
  Administered 2023-10-01: 2 mL

## 2023-10-01 SURGICAL SUPPLY — 9 items
CATARACT SUITE SIGHTPATH (MISCELLANEOUS) ×1 IMPLANT
FEE CATARACT SUITE SIGHTPATH (MISCELLANEOUS) ×1 IMPLANT
GLOVE SRG 8 PF TXTR STRL LF DI (GLOVE) ×1 IMPLANT
GLOVE SURG ENC TEXT LTX SZ7.5 (GLOVE) ×1 IMPLANT
LENS CLAREON 20.5 (Intraocular Lens) ×1 IMPLANT
LENS IOL CLRN 20.5 (Intraocular Lens) IMPLANT
NDL FILTER BLUNT 18X1 1/2 (NEEDLE) ×1 IMPLANT
NEEDLE FILTER BLUNT 18X1 1/2 (NEEDLE) ×1 IMPLANT
SYR 3ML LL SCALE MARK (SYRINGE) ×1 IMPLANT

## 2023-10-01 NOTE — Op Note (Addendum)
OPERATIVE NOTE  William Taylor [id number] 10/01/2023   PREOPERATIVE DIAGNOSIS:  Nuclear sclerotic cataract left eye. H25.12   POSTOPERATIVE DIAGNOSIS:    Nuclear sclerotic cataract left eye.     PROCEDURE:  Phacoemusification with posterior chamber intraocular lens placement of the left eye  Ultrasound time: Procedure(s): CATARACT EXTRACTION PHACO AND INTRAOCULAR LENS PLACEMENT (IOC) LEFT 23.44 01:56.2 (Left)  LENS:   Implant Name Type Inv. Item Serial No. Manufacturer Lot No. LRB No. Used Action  LENS CLAREON 20.5 - [id number] Intraocular Lens LENS CLAREON 20.5 [id number] [employer]  Left 1 Implanted    [id number] 20.5  SURGEON:  Deirdre Evener, MD   ANESTHESIA:  Topical with tetracaine drops and 2% Xylocaine jelly, augmented with 1% preservative-free intracameral lidocaine.    COMPLICATIONS:  None.   DESCRIPTION OF PROCEDURE:  The patient was identified in the holding room and transported to the operating room and placed in the supine position under the operating microscope.  The left eye was identified as the operative eye and it was prepped and draped in the usual sterile ophthalmic fashion.   A 1 millimeter clear-corneal paracentesis was made at the 1:30 position.  0.5 ml of preservative-free 1% lidocaine was injected into the anterior chamber.  The anterior chamber was filled with Viscoat viscoelastic.  A 2.4 millimeter keratome was used to make a near-clear corneal incision at the 10:30 position.  .  A curvilinear capsulorrhexis was made with a cystotome and capsulorrhexis forceps.  Balanced salt solution was used to hydrodissect and hydrodelineate the nucleus.   Phacoemulsification was then used in stop and chop fashion to remove the lens nucleus and epinucleus.  The remaining cortex was then removed using the irrigation and aspiration handpiece. Provisc was then placed into the capsular bag to distend it for lens placement.  A lens was then injected into the capsular  bag.  The remaining viscoelastic was aspirated.   Wounds were hydrated with balanced salt solution.  The anterior chamber was inflated to a physiologic pressure with balanced salt solution.  No wound leaks were noted. Cefuroxime 0.1 ml of a 10mg /ml solution was injected into the anterior chamber for a dose of 1 mg of intracameral antibiotic at the completion of the case.   Timolol and Brimonidine drops were applied to the eye.  The patient was taken to the recovery room in stable condition without complications of anesthesia or surgery.  William Taylor 10/01/2023, 12:48 PM

## 2023-10-01 NOTE — Anesthesia Postprocedure Evaluation (Signed)
Anesthesia Post Note  Patient: William Taylor  Procedure(s) Performed: CATARACT EXTRACTION PHACO AND INTRAOCULAR LENS PLACEMENT (IOC) LEFT 23.44 01:56.2 (Left)  Patient location during evaluation: PACU Anesthesia Type: MAC Level of consciousness: awake and alert Pain management: pain level controlled Vital Signs Assessment: post-procedure vital signs reviewed and stable Respiratory status: spontaneous breathing, nonlabored ventilation, respiratory function stable and patient connected to nasal cannula oxygen Cardiovascular status: stable and blood pressure returned to baseline Postop Assessment: no apparent nausea or vomiting Anesthetic complications: no   No notable events documented.   Last Vitals:  Vitals:   10/01/23 1250 10/01/23 1255  BP: (!) 169/81 (!) 173/94  Pulse: 66 69  Resp: 11 15  Temp: (!) 36.2 C (!) 36.2 C  SpO2: 100% 99%    Last Pain:  Vitals:   10/01/23 1255  TempSrc:   PainSc: 0-No pain                 Lindsea Olivar C Naim Murtha

## 2023-10-01 NOTE — H&P (Signed)
 [facility]   Primary Care Physician:  Joaquim Nam, MD Ophthalmologist: Dr. Lockie Mola  Pre-Procedure History & Physical: HPI:  William Taylor is a 89 y.o. male here for ophthalmic surgery.   Past Medical History:  Diagnosis Date   Cancer [facility])    Prostate, followed by Dr. Achilles Dunk   CKD (chronic kidney disease), stage III [facility])    Colon polyp    Coronary artery disease    a. 2005 s/p PCI LAD ([facility]); b 2012 s/p PCI OM2; c.10/2013 Cath: LAD 55m ISR, 67m, D1 90, D2 70, OM2 80 w/ patent stent;  d. 06/2019 Low risk MV; e. 07/2020 MV: EF 77%, no ischemia/infarct.   Gall stones    GERD (gastroesophageal reflux disease)    Grade I diastolic dysfunction    History of heart artery stent    Hyperlipidemia    Hypertension    Melanoma (HCC)    MI (myocardial infarction) (HCC)    2015   Orthostatic hypotension    a. prev on midodrine/florinef/northera.   PAF (paroxysmal atrial fibrillation) (HCC)    a. on xarelto and amio (CHA2DS2VASc = 4).   Pulmonary embolism (HCC)    2006   Skin cancer    Wears dentures    partial upper and lower   Wears hearing aid in both ears     Past Surgical History:  Procedure Laterality Date   CARDIAC CAT[facility]IZATION  [facility]   [facility]   CATARACT EXTRACTION  05/22/2011   right eye   colonoscopy     COLONOSCOPY W/ POLYPECTOMY  08/19/2013   Dr Shelle Iron   COLONOSCOPY WITH PROPOFOL N/A 08/15/2016   Procedure: COLONOSCOPY WITH PROPOFOL;  Surgeon: Wyline Mood, MD;  Location: [facility] ENDOSCOPY;  Service: Endoscopy;  Laterality: N/A;   CORONARY ANGIOPLASTY  08/20/2007   2005; s/p stent   ENTEROSCOPY N/A 09/22/2018   Procedure: ENTEROSCOPY;  Surgeon: Toney Reil, MD;  Location: [facility] ENDOSCOPY;  Service: Gastroenterology;  Laterality: N/A;   ESOPHAGOGASTRODUODENOSCOPY (EGD) WITH PROPOFOL N/A 08/15/2016   Procedure: ESOPHAGOGASTRODUODENOSCOPY (EGD) WITH PROPOFOL;  Surgeon: Wyline Mood, MD;  Location: [facility] ENDOSCOPY;  Service: Endoscopy;   Laterality: N/A;   GIVENS CAPSULE STUDY N/A 09/26/2016   Procedure: GIVENS CAPSULE STUDY;  Surgeon: Wyline Mood, MD;  Location: [facility] ENDOSCOPY;  Service: Endoscopy;  Laterality: N/A;   HERNIA REPAIR  08/20/2011   LEFT HEART CATH AND CORONARY ANGIOGRAPHY N/A 04/29/2022   Procedure: LEFT HEART CATH AND CORONARY ANGIOGRAPHY;  Surgeon: Iran Ouch, MD;  Location: [facility] INVASIVE CV LAB;  Service: Cardiovascular;  Laterality: N/A;   LUMBAR SPINE SURGERY     x2 in [facility] 1990[facility]   UPPER GI ENDOSCOPY  04/19/2014   Dr Shelle Iron    Prior to Admission medications   Medication Sig Start Date End Date Taking? Authorizing Provider  amiodarone (PACERONE) 100 MG tablet TAKE 1 TABLET BY MOUTH DAILY 06/13/23  Yes Gollan, Tollie Pizza, MD  cholecalciferol (VITAMIN D3) 25 MCG (1000 UNIT) tablet Take 1,000 Units by mouth daily.   Yes [provider]  fenofibrate micronized (ANTARA) 130 MG capsule TAKE 1 CAPSULE BY MOUTH DAILY  BEFORE BREAKFAST 03/06/23  Yes Glori Luis, MD  finasteride (PROSCAR) 5 MG tablet TAKE 1 TABLET BY MOUTH DAILY 06/05/23  Yes Stoioff, Verna Czech, MD  levobunolol (BETAGAN) 0.5 % ophthalmic solution Place 1 drop into the right eye 2 (two) times daily.   Yes [provider]  lisinopril (ZESTRIL) 5 MG tablet Take 1 tablet (  5 mg total) by mouth daily. 07/27/23  Yes Joaquim Nam, MD  Multiple Vitamin (MULTIVITAMIN PO) Take 1 tablet by mouth daily.   Yes [provider]  nitroGLYCERIN (NITROSTAT) 0.4 MG SL tablet DISSOLVE UNDER THE TONGUE 1 TABLET EVERY 5 MINUTES AS NEEDED FOR CHEST PAIN 04/30/23  Yes Glori Luis, MD  pantoprazole (PROTONIX) 40 MG tablet TAKE ONE (1) TABLET BY MOUTH TWO TIMES PER DAY 06/12/23  Yes Wyline Mood, MD  pravastatin (PRAVACHOL) 40 MG tablet TAKE 1 TABLET BY MOUTH AT  BEDTIME 08/12/22  Yes Worthy Rancher B, FNP  XARELTO 15 MG TABS tablet TAKE 1 TABLET BY MOUTH DAILY 04/07/23  Yes Antonieta Iba, MD  loratadine (CLARITIN) 10 MG tablet  Take 1 tablet (10 mg total) by mouth daily. 08/30/20   Glori Luis, MD    Allergies as of 09/11/2023 - Review Complete 08/27/2023  Allergen Reaction Noted   Bee venom Swelling 07/17/2015   Sulfasalazine Hives and Swelling 01/22/2016   Sulfa antibiotics Hives and Swelling 01/22/2016   Warfarin  06/12/2021   Warfarin and related Other (See Comments) 05/18/2012    Family History  Problem Relation Age of Onset   Stroke Mother    Colon cancer Father    Coronary artery disease Brother    Other Brother 62       lymes dz    Social History   Socioeconomic History   Marital status: Married    Spouse name: Not on file   Number of children: Not on file   Years of education: Not on file   Highest education level: Some college, no degree  Occupational History   Occupation: retired   Tobacco Use   Smoking status: Never   Smokeless tobacco: Never  Vaping Use   Vaping status: Never Used  Substance and Sexual Activity   Alcohol use: No   Drug use: No   Sexual activity: Not Currently  Other Topics Concern   Not on file  Social History Narrative   Retired city Emergency planning/management officer, 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     Married 2010   1 daughter in [place], [place], 2 daughters in [place], [place].   Social Drivers of Corporate investment banker Strain: Low Risk  (07/07/2023)   Overall Financial Resource Strain (CARDIA)    Difficulty of Paying Living Expenses: Not hard at all  Food Insecurity: No Food Insecurity (07/07/2023)   Hunger Vital Sign    Worried About Running Out of Food in the Last Year: Never true    Ran Out of Food in the Last Year: Never true  Transportation Needs: No Transportation Needs (07/07/2023)   PRAPARE - Administrator, Civil Service (Medical): No    Lack of Transportation (Non-Medical): No  Physical Activity: Unknown (07/07/2023)   Exercise Vital Sign    Days of Exercise per Week: 0 days    Minutes of Exercise per Session: Not on file  Stress: No Stress Concern Present  (07/07/2023)   [employer] of Occupational Health - Occupational Stress Questionnaire    Feeling of Stress : Only a little  Social Connections: Unknown (07/07/2023)   Social Connection and Isolation Panel [NHANES]    Frequency of Communication with Friends and Family: Twice a week    Frequency of Social Gatherings with Friends and Family: Three times a week    Attends Religious Services: More than 4 times per year    Active Member of Clubs or Organizations: Not on file  Attends Banker Meetings: Not on file    Marital Status: Married  Intimate Partner Violence: Not At Risk (07/25/2022)   Humiliation, Afraid, Rape, and Kick questionnaire    Fear of Current or Ex-Partner: No    Emotionally Abused: No    Physically Abused: No    Sexually Abused: No    Review of Systems: See HPI, otherwise negative ROS  Physical Exam: BP (!) 178/96   Temp (!) 97.2 F (36.2 C) (Temporal)   Resp 17   Ht 6' 3.98" (1.93 m)   Wt 84.8 kg   SpO2 97%   BMI 22.77 kg/m  General:   Alert,  pleasant and cooperative in NAD Head:  Normocephalic and atraumatic. Lungs:  Clear to auscultation.    Heart:  Regular rate and rhythm.   Impression/Plan: [name] is here for ophthalmic surgery.  Risks, benefits, limitations, and alternatives regarding ophthalmic surgery have been reviewed with the patient.  Questions have been answered.  All parties agreeable.   Lockie Mola, MD  10/01/2023, 11:42 AM

## 2023-10-01 NOTE — Transfer of Care (Signed)
Immediate Anesthesia Transfer of Care Note  Patient: William Taylor  Procedure(s) Performed: CATARACT EXTRACTION PHACO AND INTRAOCULAR LENS PLACEMENT (IOC) LEFT 23.44 01:56.2 (Left)  Patient Location: PACU  Anesthesia Type: MAC  Level of Consciousness: awake, alert  and patient cooperative  Airway and Oxygen Therapy: Patient Spontanous Breathing and Patient connected to supplemental oxygen  Post-op Assessment: Post-op Vital signs reviewed, Patient's Cardiovascular Status Stable, Respiratory Function Stable, Patent Airway and No signs of Nausea or vomiting  Post-op Vital Signs: Reviewed and stable  Complications: No notable events documented.

## 2023-10-02 ENCOUNTER — Encounter: Payer: Self-pay | Admitting: Ophthalmology

## 2023-10-22 ENCOUNTER — Ambulatory Visit: Payer: Medicare HMO

## 2023-10-22 VITALS — BP 122/80 | Ht 76.0 in | Wt 190.6 lb

## 2023-10-22 DIAGNOSIS — Z0000 Encounter for general adult medical examination without abnormal findings: Secondary | ICD-10-CM | POA: Diagnosis not present

## 2023-10-22 NOTE — Progress Notes (Signed)
 Subjective:   William Taylor is a 89 y.o. who presents for a Medicare Wellness preventive visit.  Visit Complete: In person  AWV Questionnaire: Yes: Patient Medicare AWV questionnaire was completed by the patient on 10/17/24; I have confirmed that all information answered by patient is correct and no changes since this date.  Cardiac Risk Factors include: advanced age (>14men, >42 women);dyslipidemia;hypertension;male gender;sedentary lifestyle     Objective:    Today's Vitals   10/18/23 1152 10/22/23 0840  BP:  122/80  Weight:  190 lb 9.6 oz (86.5 kg)  Height:  6\' 4"  (1.93 m)  PainSc: 0-No pain    Body mass index is 23.2 kg/m.     10/01/2023   11:26 AM 05/07/2023   10:18 AM 04/20/2023   11:50 AM 02/26/2023    8:57 AM 01/31/2023    9:58 AM 08/20/2022   11:42 AM 07/25/2022   11:00 AM  Advanced Directives  Does Patient Have a Medical Advance Directive? Yes No No Yes No No No  Type of Advance Directive Living will Healthcare Power of [place];Living will  Living will Healthcare Power of [place];Living will Healthcare Power of Attorney   Does patient want to make changes to medical advance directive? No - Patient declined No - Patient declined    No - Patient declined   Copy of Healthcare Power of Attorney in Chart?      No - copy requested   Would patient like information on creating a medical advance directive?  No - Patient declined No - Patient declined    No - Patient declined    Current Medications (verified) Outpatient Encounter Medications as of 10/22/2023  Medication Sig   amiodarone (PACERONE) 100 MG tablet TAKE 1 TABLET BY MOUTH DAILY   cholecalciferol (VITAMIN D3) 25 MCG (1000 UNIT) tablet Take 1,000 Units by mouth daily.   fenofibrate micronized (ANTARA) 130 MG capsule TAKE 1 CAPSULE BY MOUTH DAILY  BEFORE BREAKFAST   finasteride (PROSCAR) 5 MG tablet TAKE 1 TABLET BY MOUTH DAILY   levobunolol (BETAGAN) 0.5 % ophthalmic solution Place 1 drop into the right eye 2 (two)  times daily.   lisinopril (ZESTRIL) 5 MG tablet Take 1 tablet (5 mg total) by mouth daily.   loratadine (CLARITIN) 10 MG tablet Take 1 tablet (10 mg total) by mouth daily.   Multiple Vitamin (MULTIVITAMIN PO) Take 1 tablet by mouth daily.   nitroGLYCERIN (NITROSTAT) 0.4 MG SL tablet DISSOLVE UNDER THE TONGUE 1 TABLET EVERY 5 MINUTES AS NEEDED FOR CHEST PAIN   pantoprazole (PROTONIX) 40 MG tablet TAKE ONE (1) TABLET BY MOUTH TWO TIMES PER DAY   pravastatin (PRAVACHOL) 40 MG tablet TAKE 1 TABLET BY MOUTH AT  BEDTIME   XARELTO 15 MG TABS tablet TAKE 1 TABLET BY MOUTH DAILY   No facility-administered encounter medications on file as of 10/22/2023.    Allergies (verified) Bee venom, Sulfasalazine, Sulfa antibiotics, Warfarin, and Warfarin and related   History: Past Medical History:  Diagnosis Date   Cancer (HCC)    Prostate, followed by Dr. Achilles Dunk   CKD (chronic kidney disease), stage III (HCC)    Colon polyp    Coronary artery disease    a. 2005 s/p PCI LAD ([facility]); b 2012 s/p PCI OM2; c.10/2013 Cath: LAD 88m ISR, 57m, D1 90, D2 70, OM2 80 w/ patent stent;  d. 06/2019 Low risk MV; e. 07/2020 MV: EF 77%, no ischemia/infarct.   Gall stones    GERD (gastroesophageal reflux disease)  Grade I diastolic dysfunction    History of heart artery stent    Hyperlipidemia    Hypertension    Melanoma (HCC)    MI (myocardial infarction) (HCC)    2015   Orthostatic hypotension    a. prev on midodrine/florinef/northera.   PAF (paroxysmal atrial fibrillation) (HCC)    a. on xarelto and amio (CHA2DS2VASc = 4).   Pulmonary embolism (HCC)    2006   Skin cancer    Wears dentures    partial upper and lower   Wears hearing aid in both ears    Past Surgical History:  Procedure Laterality Date   CARDIAC CAT[facility]IZATION  [facility]   [facility]   CATARACT EXTRACTION  05/22/2011   right eye   CATARACT EXTRACTION W/PHACO Left 10/01/2023   Procedure: CATARACT EXTRACTION PHACO AND INTRAOCULAR LENS PLACEMENT  (IOC) LEFT 23.44 01:56.2;  Surgeon: Lockie Mola, MD;  [facility]: [facility];  Service: Ophthalmology;  Laterality: Left;   colonoscopy     COLONOSCOPY W/ POLYPECTOMY  08/19/2013   Dr Shelle Iron   COLONOSCOPY WITH PROPOFOL N/A 08/15/2016   Procedure: COLONOSCOPY WITH PROPOFOL;  Surgeon: Wyline Mood, MD;  Location: [facility] ENDOSCOPY;  Service: Endoscopy;  Laterality: N/A;   CORONARY ANGIOPLASTY  08/20/2007   2005; s/p stent   ENTEROSCOPY N/A 09/22/2018   Procedure: ENTEROSCOPY;  Surgeon: Toney Reil, MD;  Location: [facility] ENDOSCOPY;  Service: Gastroenterology;  Laterality: N/A;   ESOPHAGOGASTRODUODENOSCOPY (EGD) WITH PROPOFOL N/A 08/15/2016   Procedure: ESOPHAGOGASTRODUODENOSCOPY (EGD) WITH PROPOFOL;  Surgeon: Wyline Mood, MD;  Location: [facility] ENDOSCOPY;  Service: Endoscopy;  Laterality: N/A;   GIVENS CAPSULE STUDY N/A 09/26/2016   Procedure: GIVENS CAPSULE STUDY;  Surgeon: Wyline Mood, MD;  Location: [facility] ENDOSCOPY;  Service: Endoscopy;  Laterality: N/A;   HERNIA REPAIR  08/20/2011   LEFT HEART CATH AND CORONARY ANGIOGRAPHY N/A 04/29/2022   Procedure: LEFT HEART CATH AND CORONARY ANGIOGRAPHY;  Surgeon: Iran Ouch, MD;  Location: [facility] INVASIVE CV LAB;  Service: Cardiovascular;  Laterality: N/A;   LUMBAR SPINE SURGERY     x2 in [facility] in 1990's   UPPER GI ENDOSCOPY  04/19/2014   Dr Shelle Iron   Family History  Problem Relation Age of Onset   Stroke Mother    Colon cancer Father    Coronary artery disease Brother    Other Brother 86       lymes dz   Social History   Socioeconomic History   Marital status: Married    Spouse name: Not on file   Number of children: Not on file   Years of education: Not on file   Highest education level: Some college, no degree  Occupational History   Occupation: retired   Tobacco Use   Smoking status: Never   Smokeless tobacco: Never  Vaping Use   Vaping status: Never Used  Substance and Sexual Activity   Alcohol use: No   Drug use: No    Sexual activity: Not Currently  Other Topics Concern   Not on file  Social History Narrative   Retired city Emergency planning/management officer, 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     Married 2010   1 daughter in [place], [place], 2 daughters in [place], [place].   Social Drivers of Corporate investment banker Strain: Low Risk  (10/18/2023)   Overall Financial Resource Strain (CARDIA)    Difficulty of Paying Living Expenses: Not hard at all  Food Insecurity: No Food Insecurity (10/18/2023)   Hunger Vital Sign    Worried About  Running Out of Food in the Last Year: Never true    Ran Out of Food in the Last Year: Never true  Transportation Needs: No Transportation Needs (10/18/2023)   PRAPARE - Administrator, Civil Service (Medical): No    Lack of Transportation (Non-Medical): No  Physical Activity: Insufficiently Active (10/22/2023)   Exercise Vital Sign    Days of Exercise per Week: 5 days    Minutes of Exercise per Session: 20 min  Stress: No Stress Concern Present (10/22/2023)   [employer] of Occupational Health - Occupational Stress Questionnaire    Feeling of Stress : Only a little  Social Connections: Socially Integrated (10/18/2023)   Social Connection and Isolation Panel [NHANES]    Frequency of Communication with Friends and Family: Once a week    Frequency of Social Gatherings with Friends and Family: Twice a week    Attends Religious Services: More than 4 times per year    Active Member of [employer] or Organizations: Yes    Attends Engineer, structural: More than 4 times per year    Marital Status: Married    Tobacco Counseling   Clinical Intake:  Pre-visit preparation completed: Yes  Pain : No/denies pain Pain Score: 0-No pain    BMI - recorded: 23.2 Nutritional Status: BMI of 19-24  Normal Nutritional Risks: None Diabetes: No  How often do you need to have someone help you when you read instructions, pamphlets, or other written materials from your doctor or pharmacy?: 1 -  Never  Interpreter Needed?: No  Comments: lives with wife Information entered by :: B.Morris Longenecker,LPN   Activities of Daily Living     10/18/2023   11:52 AM 10/01/2023   11:27 AM  In your present state of health, do you have any difficulty performing the following activities:  Hearing? 1 1  Vision? 1 1  Difficulty concentrating or making decisions? 0 0  Walking or climbing stairs? 1   Dressing or bathing? 0   Doing errands, shopping? 0   Preparing Food and eating ? N   Using the Toilet? N   In the past six months, have you accidently leaked urine? Y   Do you have problems with loss of bowel control? N   Managing your Medications? N   Managing your Finances? N   Housekeeping or managing your Housekeeping? N     Patient Care Team: Joaquim Nam, MD as PCP - General (Family Medicine) Lemar Livings, Merrily Pew, MD as Consulting Physician (General Surgery) Shelia Media, MD as Referring Physician (Internal Medicine) Antonieta Iba, MD as Consulting Physician (Cardiology) Creig Hines, MD as Consulting Physician (Oncology) Lockie Mola, MD as Referring Physician (Ophthalmology)  Indicate any recent Medical Services you may have received from other than [facility] providers in the past year (date may be approximate).     Assessment:   This is a routine wellness examination for [place].  Hearing/Vision screen Hearing Screening - Comments:: Pt says he has new hearing aids that are not working; still working on adjusting w/[employer] Vision Screening - Comments:: Pt says he just had cataract surgery on both but rt eye vision is not good;left eye 20/20 Dr Inez Pilgrim    Goals Addressed               This Visit's Progress     COMPLETED: DIET - INCREASE WATER INTAKE (pt-stated)   On track     Patient Stated (pt-stated)  I would like to see my 89rd birthday       Depression Screen     10/22/2023    8:53 AM 07/14/2023   11:24 AM 04/30/2023    2:03 PM 03/05/2023     8:05 AM 08/06/2022   10:29 AM 03/27/2022    4:08 PM 07/19/2021   10:22 AM  PHQ 2/9 Scores  PHQ - 2 Score 0 1 0 0 0 0 0  PHQ- 9 Score  5 5 2 4       Fall Risk     10/18/2023   11:52 AM 07/14/2023   11:24 AM 04/30/2023    2:02 PM 03/05/2023    8:05 AM 03/27/2022    4:08 PM  Fall Risk   Falls in the past year? 1 1 1 1  0  Number falls in past yr: 1 0 0 0 0  Injury with Fall? 1 1 0 1 0  Risk for fall due to : History of fall(s);Impaired mobility History of fall(s) History of fall(s) History of fall(s) No Fall Risks  Follow up Falls prevention discussed;Education provided Falls evaluation completed Falls evaluation completed Falls evaluation completed Falls evaluation completed    MEDICARE RISK AT HOME:  Medicare Risk at Home Any stairs in or around the home?: (Patient-Rptd) No If so, are there any without handrails?: (Patient-Rptd) No Adequate lighting in your home to reduce risk of falls?: (Patient-Rptd) Yes Life alert?: (Patient-Rptd) No Use of a cane, walker or w/c?: (Patient-Rptd) Yes Grab bars in the bathroom?: (Patient-Rptd) Yes Shower chair or bench in shower?: (Patient-Rptd) Yes Elevated toilet seat or a handicapped toilet?: (Patient-Rptd) No  TIMED UP AND GO:  Was the test performed?  Yes  Length of time to ambulate 10 feet: 15 sec Gait slow and steady without use of assistive device  Cognitive Function: 6CIT completed        10/22/2023    8:58 AM 09/08/2019    2:24 PM 06/26/2018   11:35 AM  6CIT Screen  What Year? 0 points 0 points 0 points  What month? 0 points 0 points 0 points  What time? 0 points 0 points 0 points  Count back from 20 0 points 0 points 0 points  Months in reverse 0 points  0 points  Repeat phrase 0 points  0 points  Total Score 0 points  0 points    Immunizations Immunization History  Administered Date(s) Administered   Fluad Quad(high Dose 65+) 04/15/2019, 05/09/2020, 05/07/2022   Fluad Trivalent(High Dose 65+) 04/30/2023   Influenza Split  05/16/2011, 05/12/2012   Influenza, High Dose Seasonal PF 05/05/2015, 05/03/2016, 04/24/2017, 05/25/2018   Influenza,inj,Quad PF,6+ Mos 05/19/2013, 04/15/2014   Influenza-Unspecified 05/07/2021   Moderna Covid-19 Vaccine Bivalent Booster 45yrs & up 07/06/2021   Moderna SARS-COV2 Booster Vaccination 05/19/2020   Moderna Sars-Covid-2 Vaccination 09/16/2019, 10/14/2019   Pneumococcal Conjugate-13 12/01/2013   Pneumococcal Polysaccharide-23 01/22/2011   Tdap 05/05/2015   Zoster, Live 01/24/2012    Screening Tests Health Maintenance  Topic Date Due   Zoster Vaccines- Shingrix (1 of 2) 12/06/1948   COVID-19 Vaccine (4 - 2024-25 season) 04/20/2023   Medicare Annual Wellness (AWV)  10/21/2024   DTaP/Tdap/Td (2 - Td or Tdap) 05/04/2025   Pneumonia Vaccine 52+ Years old  Completed   INFLUENZA VACCINE  Completed   HPV VACCINES  Aged Out    Health Maintenance  Health Maintenance Due  Topic Date Due   Zoster Vaccines- Shingrix (1 of 2) 12/06/1948   COVID-19 Vaccine (4 -  2024-25 season) 04/20/2023   Health Maintenance Items Addressed: none needed   Additional Screening:  Vision Screening: Recommended annual ophthalmology exams for early detection of glaucoma and other disorders of the eye.  Dental Screening: Recommended annual dental exams for proper oral hygiene  Community Resource Referral / Chronic Care Management: CRR required this visit?  No   CCM required this visit?  No    Plan:     I have personally reviewed and noted the following in the patient's chart:   Medical and social history Use of alcohol, tobacco or illicit drugs  Current medications and supplements including opioid prescriptions. Patient is not currently taking opioid prescriptions. Functional ability and status Nutritional status Physical activity Advanced directives List of other physicians Hospitalizations, surgeries, and ER visits in previous 12 months Vitals Screenings to include cognitive,  depression, and falls Referrals and appointments  In addition, I have reviewed and discussed with patient certain preventive protocols, quality metrics, and best practice recommendations. A written personalized care plan for preventive services as well as general preventive health recommendations were provided to patient.    Sue Lush, LPN   08/24/1094   After Visit Summary: (MyChart) Due to this being a telephonic visit, the after visit summary with patients personalized plan was offered to patient via MyChart   Notes: Nothing significant to report at this time.

## 2023-10-22 NOTE — Patient Instructions (Signed)
 William Taylor , Thank you for taking time to come for your Medicare Wellness Visit. I appreciate your ongoing commitment to your health goals. Please review the following plan we discussed and let me know if I can assist you in the future.   Referrals/Orders/Follow-Ups/Clinician Recommendations: none  This is a list of the screening recommended for you and due dates:  Health Maintenance  Topic Date Due   Zoster (Shingles) Vaccine (1 of 2) 12/06/1948   COVID-19 Vaccine (4 - 2024-25 season) 04/20/2023   Medicare Annual Wellness Visit  10/21/2024   DTaP/Tdap/Td vaccine (2 - Td or Tdap) 05/04/2025   Pneumonia Vaccine  Completed   Flu Shot  Completed   HPV Vaccine  Aged Out    Advanced directives: (Declined) Advance directive discussed with you today. Even though you declined this today, please call our office should you change your mind, and we can give you the proper paperwork for you to fill out.  Next Medicare Annual Wellness Visit scheduled for next year: Yes 10/25/2024 @ 8:50am in person

## 2023-11-04 ENCOUNTER — Inpatient Hospital Stay: Payer: Medicare HMO | Attending: Oncology

## 2023-11-04 ENCOUNTER — Other Ambulatory Visit: Payer: Self-pay

## 2023-11-04 ENCOUNTER — Other Ambulatory Visit: Payer: Medicare HMO

## 2023-11-04 DIAGNOSIS — Z8546 Personal history of malignant neoplasm of prostate: Secondary | ICD-10-CM | POA: Diagnosis not present

## 2023-11-04 DIAGNOSIS — D649 Anemia, unspecified: Secondary | ICD-10-CM | POA: Insufficient documentation

## 2023-11-04 DIAGNOSIS — D5912 Cold autoimmune hemolytic anemia: Secondary | ICD-10-CM | POA: Diagnosis present

## 2023-11-04 LAB — CBC WITH DIFFERENTIAL (CANCER CENTER ONLY)
Abs Immature Granulocytes: 0.12 10*3/uL — ABNORMAL HIGH (ref 0.00–0.07)
Basophils Absolute: 0 10*3/uL (ref 0.0–0.1)
Basophils Relative: 1 %
Eosinophils Absolute: 0 10*3/uL (ref 0.0–0.5)
Eosinophils Relative: 1 %
HCT: 28.6 % — ABNORMAL LOW (ref 39.0–52.0)
Hemoglobin: 9.5 g/dL — ABNORMAL LOW (ref 13.0–17.0)
Immature Granulocytes: 2 %
Lymphocytes Relative: 22 %
Lymphs Abs: 1.5 10*3/uL (ref 0.7–4.0)
MCH: 34.7 pg — ABNORMAL HIGH (ref 26.0–34.0)
MCHC: 33.2 g/dL (ref 30.0–36.0)
MCV: 104.4 fL — ABNORMAL HIGH (ref 80.0–100.0)
Monocytes Absolute: 0.7 10*3/uL (ref 0.1–1.0)
Monocytes Relative: 10 %
Neutro Abs: 4.3 10*3/uL (ref 1.7–7.7)
Neutrophils Relative %: 64 %
Platelet Count: 251 10*3/uL (ref 150–400)
RBC: 2.74 MIL/uL — ABNORMAL LOW (ref 4.22–5.81)
RDW: 14.1 % (ref 11.5–15.5)
WBC Count: 6.6 10*3/uL (ref 4.0–10.5)
nRBC: 0 % (ref 0.0–0.2)

## 2023-11-04 LAB — RETICULOCYTES
Immature Retic Fract: 15.1 % (ref 2.3–15.9)
RBC.: 2.77 MIL/uL — ABNORMAL LOW (ref 4.22–5.81)
Retic Count, Absolute: 72 10*3/uL (ref 19.0–186.0)
Retic Ct Pct: 2.6 % (ref 0.4–3.1)

## 2023-11-04 LAB — PSA: Prostatic Specific Antigen: <0.01 ng/mL (ref 0.00–4.00)

## 2023-11-06 LAB — MULTIPLE MYELOMA PANEL, SERUM
Albumin SerPl Elph-Mcnc: 3.6 g/dL (ref 2.9–4.4)
Albumin/Glob SerPl: 1.4 (ref 0.7–1.7)
Alpha 1: 0.3 g/dL (ref 0.0–0.4)
Alpha2 Glob SerPl Elph-Mcnc: 0.5 g/dL (ref 0.4–1.0)
B-Globulin SerPl Elph-Mcnc: 1 g/dL (ref 0.7–1.3)
Gamma Glob SerPl Elph-Mcnc: 0.9 g/dL (ref 0.4–1.8)
Globulin, Total: 2.7 g/dL (ref 2.2–3.9)
IgA: 251 mg/dL (ref 61–437)
IgG (Immunoglobin G), Serum: 814 mg/dL (ref 603–1613)
IgM (Immunoglobulin M), Srm: 151 mg/dL — ABNORMAL HIGH (ref 15–143)
M Protein SerPl Elph-Mcnc: 0.2 g/dL — ABNORMAL HIGH
Total Protein ELP: 6.3 g/dL (ref 6.0–8.5)

## 2023-11-06 LAB — HAPTOGLOBIN: Haptoglobin: <10 mg/dL — ABNORMAL LOW (ref 38–329)

## 2023-11-15 ENCOUNTER — Encounter: Payer: Self-pay | Admitting: Family Medicine

## 2023-11-17 NOTE — Telephone Encounter (Signed)
 Marland Kitchen

## 2023-11-18 ENCOUNTER — Other Ambulatory Visit: Payer: Self-pay | Admitting: Family Medicine

## 2023-11-18 DIAGNOSIS — Z760 Encounter for issue of repeat prescription: Secondary | ICD-10-CM

## 2023-11-18 MED ORDER — PRAVASTATIN SODIUM 40 MG PO TABS: 40.0000 mg | ORAL_TABLET | Freq: Every day | ORAL | 3 refills | Status: DC

## 2023-11-18 NOTE — Telephone Encounter (Signed)
 No, we have not received anything via mail order

## 2023-11-18 NOTE — Telephone Encounter (Signed)
 See MyChart message.  Did this request ever come in from mail order?  Please let me know.  Thanks.

## 2023-11-24 ENCOUNTER — Ambulatory Visit: Payer: Self-pay

## 2023-11-24 NOTE — Telephone Encounter (Signed)
  Chief Complaint: back pain Symptoms: back/hip pain on right &left: more on left Frequency: x 1 week and worsening Pertinent Negatives: Patient denies numbness and tingling Disposition: [] ED /[] Urgent Care (no appt availability in office) / [x] Appointment(In office/virtual)/ []  [facility] [facility]/ [] Home Care/ [] Refused Recommended Disposition /[] [place] [facility]/ []  Follow-up with PCP Additional Notes: pt taking tylenol arthritis which helps some but pain quickly returns  Reason for Disposition  [1] MODERATE back pain (e.g., interferes with normal activities) AND [2] present > 3 days  Answer Assessment - Initial Assessment Questions 1. ONSET: "When did the pain begin?"      Last week and worsening 2. LOCATION: "Where does it hurt?" (upper, mid or lower back)    Left and right  Hip/back pain- mostly left 3. SEVERITY: "How bad is the pain?"  (e.g., Scale 1-10; mild, moderate, or severe)   - MILD (1-3): Doesn't interfere with normal activities.    - MODERATE (4-7): Interferes with normal activities or awakens from sleep.    - SEVERE (8-10): Excruciating pain, unable to do any normal activities.      moderate 4. PATTERN: "Is the pain constant?" (e.g., yes, no; constant, intermittent)      Comes and goes 5. RADIATION: "Does the pain shoot into your legs or somewhere else?"     N/a 6. CAUSE:  "What do you think is causing the back pain?"      N/a 7. BACK OVERUSE:  "Any recent lifting of heavy objects, strenuous work or exercise?"     N/a 8. MEDICINES: "What have you taken so far for the pain?" (e.g., nothing, acetaminophen, NSAIDS)     Tylenol  9. NEUROLOGIC SYMPTOMS: "Do you have any weakness, numbness, or problems with bowel/bladder control?"     Bilateral leg weakness 10. OTHER SYMPTOMS: "Do you have any other symptoms?" (e.g., fever, abdomen pain, burning with urination, blood in urine)       no 11. PREGNANCY: "Is there any chance you are pregnant?" "When was your last  menstrual period?"       N/a  Protocols used: Back Pain-A-AH

## 2023-11-28 ENCOUNTER — Encounter: Payer: Self-pay | Admitting: Family Medicine

## 2023-11-28 ENCOUNTER — Ambulatory Visit: Admitting: Family Medicine

## 2023-11-28 VITALS — BP 118/74 | HR 74 | Temp 97.7°F | Ht 76.0 in | Wt 192.0 lb

## 2023-11-28 DIAGNOSIS — G8929 Other chronic pain: Secondary | ICD-10-CM

## 2023-11-28 DIAGNOSIS — M549 Dorsalgia, unspecified: Secondary | ICD-10-CM | POA: Diagnosis not present

## 2023-11-28 NOTE — Patient Instructions (Signed)
 Could be bursitis near the right hip and arthritis in the back and left knee.  I would try ice for 5 minutes at a time on the hip and knee.   Tylenol as needed for pain.  Update me as needed.  Take care.  Glad to see you.

## 2023-11-28 NOTE — Progress Notes (Signed)
 Walking with a cane.  Pain increased over the last few weeks.  B buttock pain that could radiate to the B upper legs.  Some L knee pain.  Wife encouraged him to get checked.  Some better recently.    R buttock pain every AM. Pain is better as the day goes on.  No falls.  No fevers, no chills.  No dysuria.  H/o back surgery x2.      No midline pain but lateral B lower back pain near the SI joints and lateral hip pain. Skin isn't hypersensitive.    Last few days with L lateral knee pain.  L knee isn't locking.    Taking tylenol prn.   Meds, vitals, and allergies reviewed.   ROS: Per HPI unless specifically indicated in ROS section   Nad Ncat Neck supple, no LA Rrr Ctab Abd soft not ttp R greater troch ttp  L greater troch not ttp Hips not ttp with int rotation.  SLR neg B L knee ttp laterally but not locking or clicking.  Mild crepitus on ROM.   Back not ttp midline.

## 2023-11-29 ENCOUNTER — Encounter: Payer: Self-pay | Admitting: Cardiovascular Disease

## 2023-11-30 NOTE — Assessment & Plan Note (Signed)
 History of.  Discussed options and his situation. Could have greater trochanteric bursitis and arthritis in the back and left knee.  I would try ice for 5 minutes at a time on the hip and knee.   Tylenol as needed for pain.  Update me as needed.  He agrees with plan.

## 2023-12-01 ENCOUNTER — Other Ambulatory Visit: Payer: Self-pay | Admitting: *Deleted

## 2023-12-01 MED ORDER — AMIODARONE HCL 100 MG PO TABS: 100.0000 mg | ORAL_TABLET | Freq: Every day | ORAL | 3 refills | Status: AC

## 2023-12-01 MED ORDER — AMIODARONE HCL 100 MG PO TABS: 100.0000 mg | ORAL_TABLET | Freq: Every day | ORAL | 3 refills | Status: DC

## 2023-12-08 IMAGING — CR DG CHEST 2V
1 series · 3 of 3 positions shown · non-contrast
Comparison: 09/08/2020

CLINICAL DATA: Shortness of breath

EXAM:
CHEST - 2 VIEW

[Series 1: dg chest 2 view · 0.14mm/px · 3 of 3 slices shown]
[im 1/3]
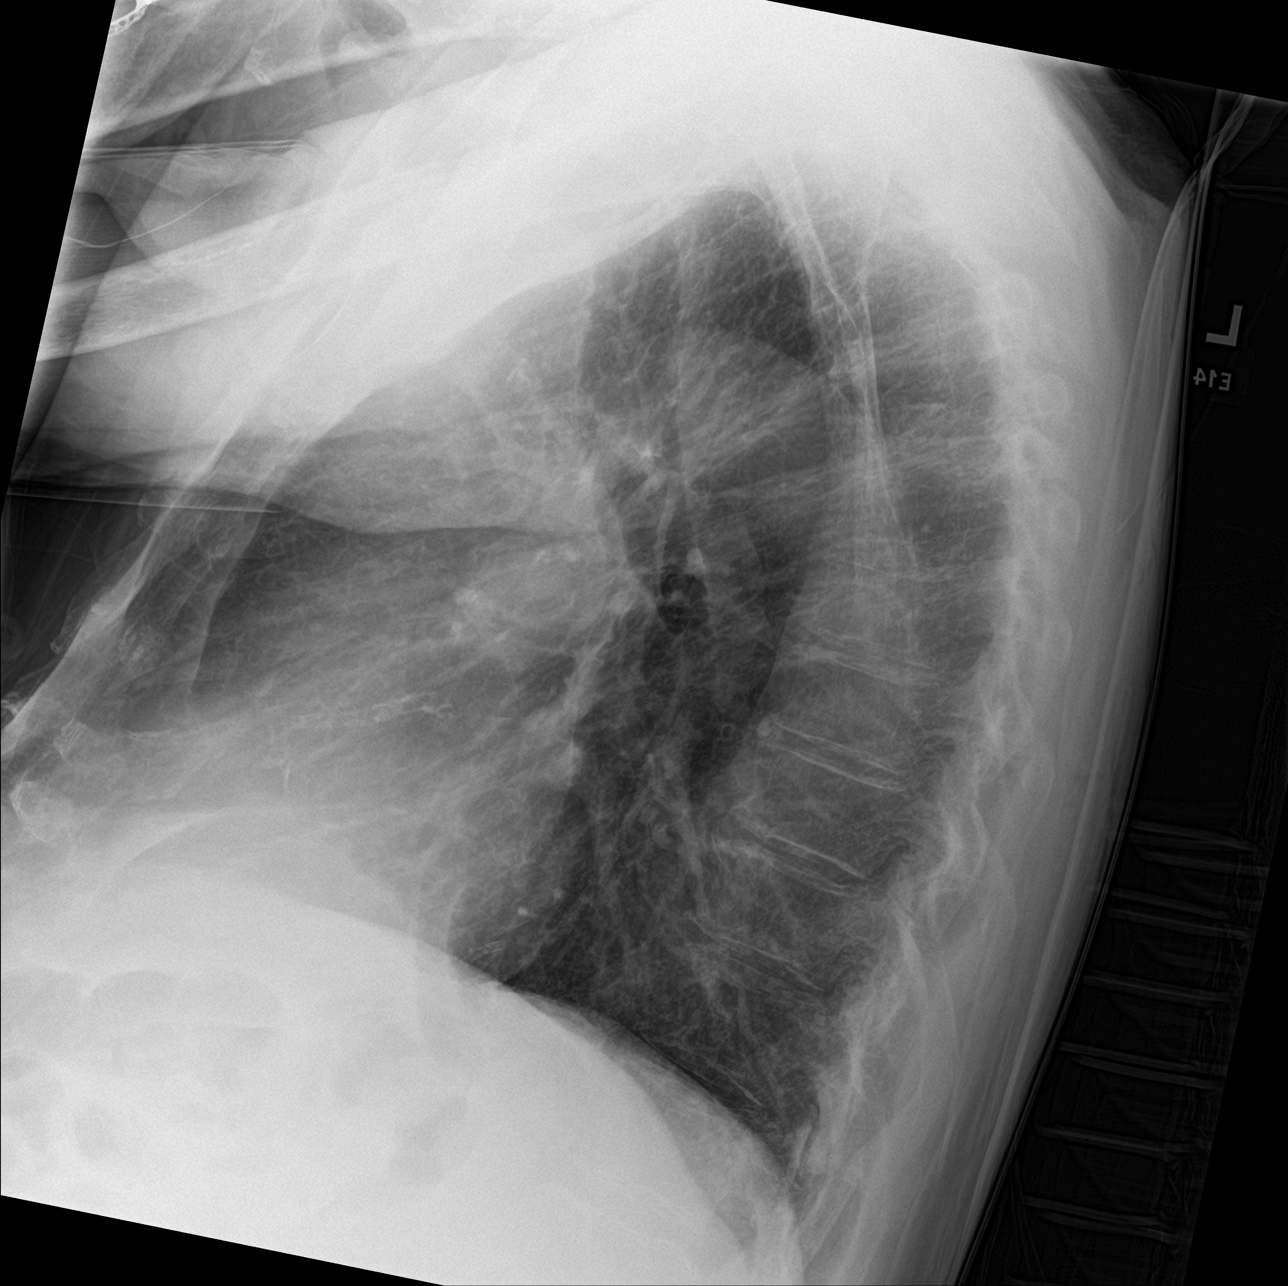
[im 2/3]
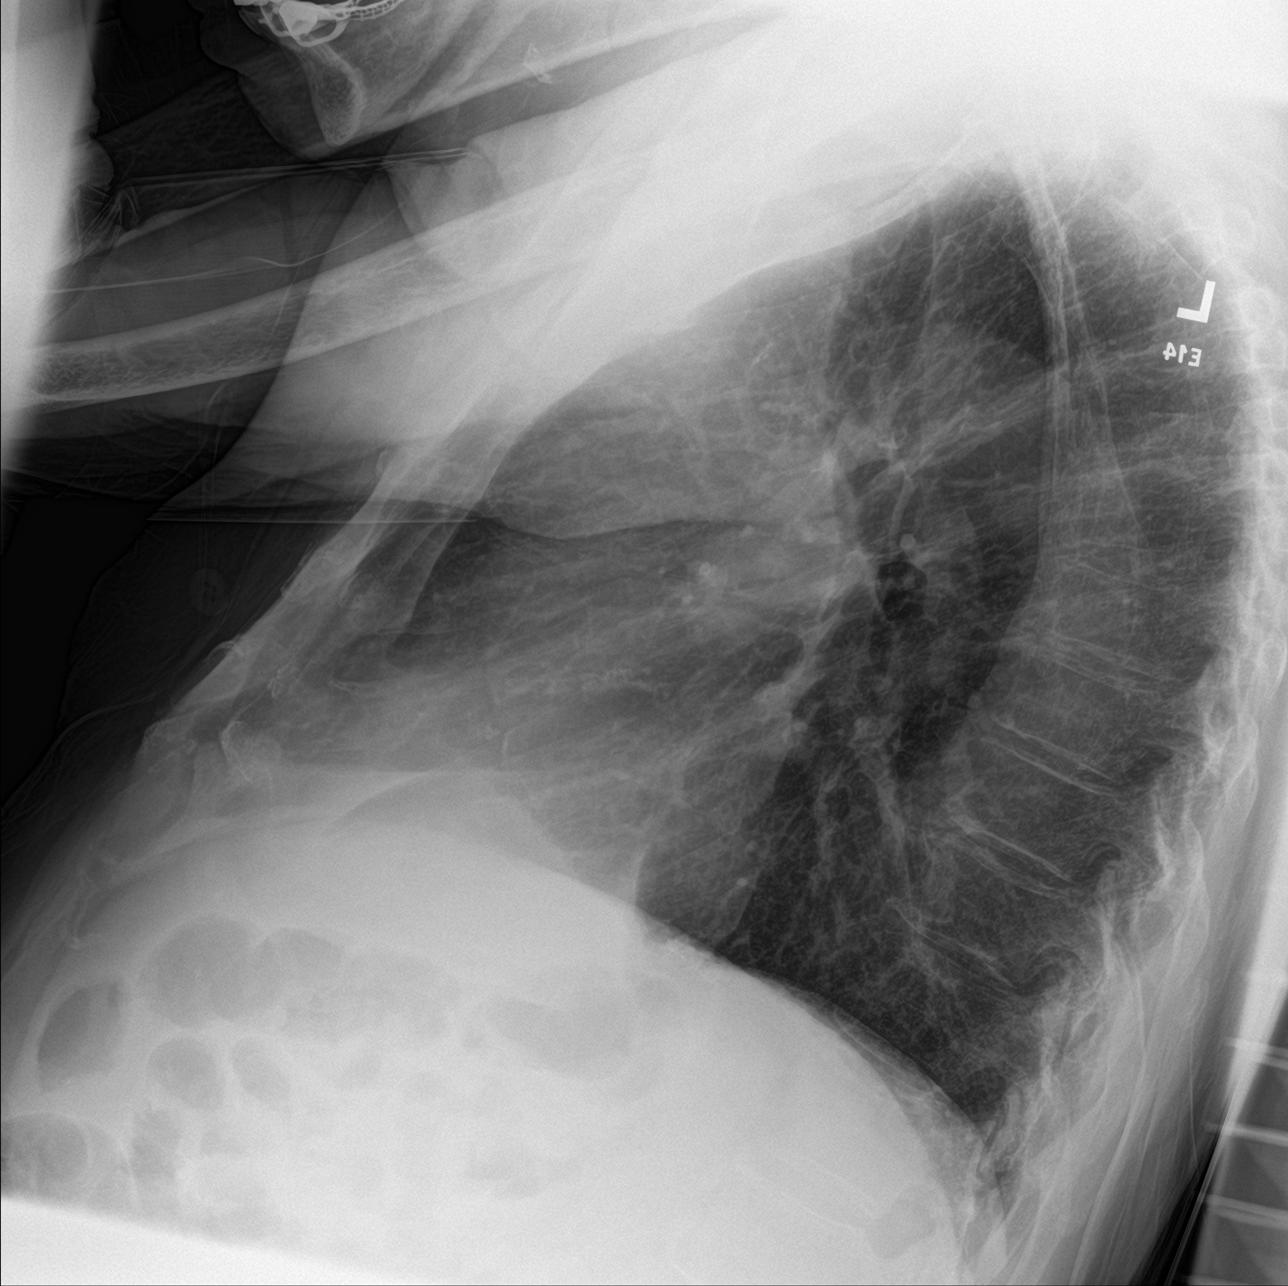
[im 3/3]
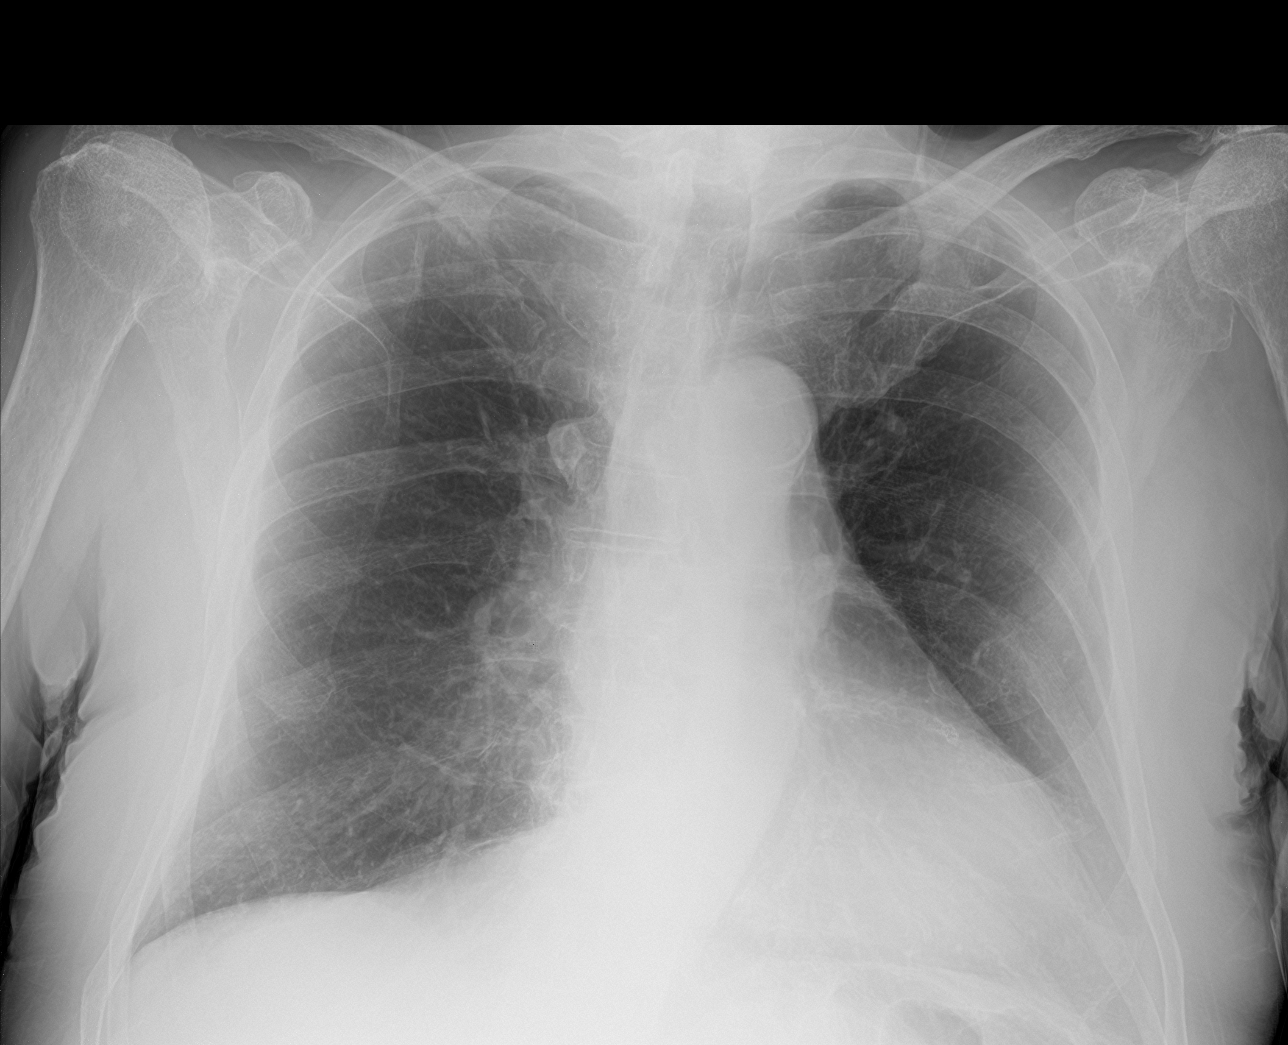

[3 of 3 positions shown; findings below may reference images not displayed]

FINDINGS: Lungs are hyperinflated as can be seen with COPD. No focal
consolidation. No pleural effusion or pneumothorax. Stable
cardiomegaly.

No acute osseous abnormality.
IMPRESSION: No active cardiopulmonary disease.

## 2024-01-04 ENCOUNTER — Other Ambulatory Visit: Payer: Self-pay

## 2024-01-04 ENCOUNTER — Emergency Department
Admission: EM | Admit: 2024-01-04 | Discharge: 2024-01-04 | Disposition: A | Discharge status: Home / Self Care | Attending: Emergency Medicine | Admitting: Emergency Medicine

## 2024-01-04 ENCOUNTER — Emergency Department

## 2024-01-04 DIAGNOSIS — R519 Headache, unspecified: Secondary | ICD-10-CM | POA: Diagnosis present

## 2024-01-04 DIAGNOSIS — Z7901 Long term (current) use of anticoagulants: Secondary | ICD-10-CM | POA: Insufficient documentation

## 2024-01-04 DIAGNOSIS — I2510 Atherosclerotic heart disease of native coronary artery without angina pectoris: Secondary | ICD-10-CM | POA: Insufficient documentation

## 2024-01-04 DIAGNOSIS — E119 Type 2 diabetes mellitus without complications: Secondary | ICD-10-CM | POA: Insufficient documentation

## 2024-01-04 DIAGNOSIS — I509 Heart failure, unspecified: Secondary | ICD-10-CM | POA: Diagnosis not present

## 2024-01-04 LAB — CBC
HCT: 28.5 % — ABNORMAL LOW (ref 39.0–52.0)
Hemoglobin: 9.6 g/dL — ABNORMAL LOW (ref 13.0–17.0)
MCH: 33.1 pg (ref 26.0–34.0)
MCHC: 33.7 g/dL (ref 30.0–36.0)
MCV: 98.3 fL (ref 80.0–100.0)
Platelets: 220 10*3/uL (ref 150–400)
RBC: 2.9 MIL/uL — ABNORMAL LOW (ref 4.22–5.81)
RDW: 14.1 % (ref 11.5–15.5)
WBC: 6.3 10*3/uL (ref 4.0–10.5)
nRBC: 0 % (ref 0.0–0.2)

## 2024-01-04 LAB — COMPREHENSIVE METABOLIC PANEL WITH GFR
ALT: 13 U/L (ref 0–44)
AST: 24 U/L (ref 15–41)
Albumin: 4 g/dL (ref 3.5–5.0)
Alkaline Phosphatase: 28 U/L — ABNORMAL LOW (ref 38–126)
Anion gap: 7 (ref 5–15)
BUN: 24 mg/dL — ABNORMAL HIGH (ref 8–23)
CO2: 26 mmol/L (ref 22–32)
Calcium: 9.4 mg/dL (ref 8.9–10.3)
Chloride: 107 mmol/L (ref 98–111)
Creatinine, Ser: 1.3 mg/dL — ABNORMAL HIGH (ref 0.61–1.24)
GFR, Estimated: 52 mL/min — ABNORMAL LOW (ref >60)
Glucose, Bld: 108 mg/dL — ABNORMAL HIGH (ref 70–99)
Potassium: 4 mmol/L (ref 3.5–5.1)
Sodium: 140 mmol/L (ref 135–145)
Total Bilirubin: 1.5 mg/dL — ABNORMAL HIGH (ref 0.0–1.2)
Total Protein: 6.8 g/dL (ref 6.5–8.1)

## 2024-01-04 LAB — URINALYSIS, ROUTINE W REFLEX MICROSCOPIC
Bilirubin Urine: NEGATIVE
Glucose, UA: NEGATIVE mg/dL
Hgb urine dipstick: NEGATIVE
Ketones, ur: NEGATIVE mg/dL
Leukocytes,Ua: NEGATIVE
Nitrite: NEGATIVE
Protein, ur: NEGATIVE mg/dL
Specific Gravity, Urine: 1.017 (ref 1.005–1.030)
pH: 5 (ref 5.0–8.0)

## 2024-01-04 LAB — LIPASE, BLOOD: Lipase: 30 U/L (ref 11–51)

## 2024-01-04 LAB — TROPONIN I (HIGH SENSITIVITY): Troponin I (High Sensitivity): 9 ng/L (ref <18)

## 2024-01-04 NOTE — ED Provider Notes (Signed)
 [facility] Provider Note    Event Date/Time   First MD Initiated Contact with Patient 01/04/24 0940     (approximate)   History   Headache   HPI  William Taylor is a 89 y.o. male past medical history of heart failure, CAD, diabetes, paroxysmal atrial fibrillation on Xarelto  who presents to the emergency department for a headache.  Patient states that he started having headache a couple of days ago.  Took Tylenol  prior to arrival at 7 AM.  States that initially it was not helping his headache but denies a headache at this time.  Denies change in vision or slurring of speech.  Denies extremity numbness or weakness.  Denies any new falls or trauma.  Does not believe this is the worst headache of his life.  Not sudden in onset.  States that he was also having abdominal pain earlier today and he was concerned that it could be from a prior hernia.  Denies any abdominal pain at this time.  Denies any nausea or vomiting.  Denies constipation or diarrhea.  Denies dysuria, urinary urgency or frequency.  States that he walks with a cane and has not had any new changes with walking with a cane.   Physical Exam   Triage Vital Signs: ED Triage Vitals  Encounter Vitals Group     BP      Systolic BP Percentile      Diastolic BP Percentile      Pulse      Resp      Temp      Temp src      SpO2      Weight      Height      Head Circumference      Peak Flow      Pain Score      Pain Loc      Pain Education      Exclude from Growth Chart     Most recent vital signs: Vitals:   01/04/24 1130 01/04/24 1200  BP: (!) 178/96 (!) 176/94  Pulse: 69 63  Resp: 20   Temp:    SpO2: 97% 98%    Physical Exam Constitutional:      Appearance: He is well-developed.  HENT:     Head: Atraumatic.  Eyes:     Conjunctiva/sclera: Conjunctivae normal.  Cardiovascular:     Rate and Rhythm: Regular rhythm.  Pulmonary:     Effort: No respiratory distress.     Breath  sounds: No wheezing.  Abdominal:     General: There is no distension.     Palpations: Abdomen is soft.     Tenderness: There is no abdominal tenderness.  Musculoskeletal:        General: No swelling or tenderness. Normal range of motion.     Cervical back: Normal range of motion.  Skin:    General: Skin is warm.     Capillary Refill: Capillary refill takes less than 2 seconds.  Neurological:     Mental Status: He is alert. Mental status is at baseline.     GCS: GCS eye subscore is 4. GCS verbal subscore is 5. GCS motor subscore is 6.     Cranial Nerves: Cranial nerves 2-12 are intact.     Sensory: Sensation is intact.     Motor: Motor function is intact.     Coordination: Coordination is intact.     IMPRESSION / MDM / ASSESSMENT AND PLAN /  ED COURSE  I reviewed the triage vital signs and the nursing notes.  Differential diagnosis including intracranial hemorrhage, migraine, anemia, electrolyte abnormality, dehydration.  My evaluation patient states that his headache is feeling much better.  Has a nonfocal neurologic exam and clinical picture is not consistent with a CVA.   No tachycardic or bradycardic dysrhythmias while on cardiac telemetry.  RADIOLOGY I independently reviewed imaging, my interpretation of imaging: CT scan of head with no signs of intracranial hemorrhage.  Chronic left PCA infarct which is stable.  LABS (all labs ordered are listed, but only abnormal results are displayed) Labs interpreted as -    Labs Reviewed  COMPREHENSIVE METABOLIC PANEL WITH GFR - Abnormal; Notable for the following components:      Result Value   Glucose, Bld 108 (*)    BUN 24 (*)    Creatinine, Ser 1.30 (*)    Alkaline Phosphatase 28 (*)    Total Bilirubin 1.5 (*)    GFR, Estimated 52 (*)    All other components within normal limits  URINALYSIS, ROUTINE W REFLEX MICROSCOPIC - Abnormal; Notable for the following components:   Color, Urine YELLOW (*)    APPearance CLEAR (*)     All other components within normal limits  CBC - Abnormal; Notable for the following components:   RBC 2.90 (*)    Hemoglobin 9.6 (*)    HCT 28.5 (*)    All other components within normal limits  LIPASE, BLOOD  TROPONIN I (HIGH SENSITIVITY)     MDM  Patient's lab work overall reassuring.  Does have anemia but appears chronic and at his baseline.  No leukocytosis.  Creatinine at baseline with no significant electrolyte abnormality.  Troponin is negative and at his baseline.  Normal lipase.  UA with no signs of urinary tract infection.  CT scan of the head with no findings of intracranial hemorrhage.  No sudden onset or thunderclap headache, not worst headache of his life, have a low suspicion for subarachnoid hemorrhage.  Abdomen is nontender to palpation.  Have a low suspicion for incarcerated or strangulated hernia.  Patient is requesting to follow-up as an outpatient with a different surgeon so we will put surgery on his paperwork.  States that his headache feels much better after Tylenol .  No concern for intractable headache.  Discussed close follow-up with primary care physician and return for any worsening symptoms.     PROCEDURES:  Critical Care performed: No  Procedures  Patient's presentation is most consistent with acute presentation with potential threat to life or bodily function.   MEDICATIONS ORDERED IN ED: Medications - No data to display  FINAL CLINICAL IMPRESSION(S) / ED DIAGNOSES   Final diagnoses:  Headache disorder     Rx / DC Orders   ED Discharge Orders     None        Note:  This document was prepared using Dragon voice recognition software and may include unintentional dictation errors.   Viviano Ground, MD 01/04/24 1216

## 2024-01-04 NOTE — ED Notes (Signed)
 Patient transported to CT

## 2024-01-04 NOTE — ED Triage Notes (Signed)
 Pt states HA since Friday. Pt states ABD pain intermittent for "several months". Pt denies N/V/D. Pt denies fevers. Pt denies head injury.

## 2024-01-04 NOTE — ED Notes (Signed)
 Recollect of labs sent for lab to verify

## 2024-01-04 NOTE — Discharge Instructions (Signed)
 You were seen in the emergency department for headache.  He had a CT scan of your head that did not show any new findings.  Your lab work was normal.  You have no findings of urinary tract infection.  Your abdomen was nontender and have a low suspicion for an emergency of your hernia right now.  You are given information to follow-up with general surgery.  Follow-up closely with your primary care physician.  Return to the emergency department for any return of headache or worsening symptoms.  Pain control:  Acetaminophen  (tylenol ) - You can take 2 extra strength tablets (1000 mg) every 6 hours as needed for pain

## 2024-01-05 ENCOUNTER — Encounter: Payer: Self-pay | Admitting: Family Medicine

## 2024-01-07 ENCOUNTER — Telehealth: Payer: Self-pay

## 2024-01-07 NOTE — Transitions of Care (Post Inpatient/ED Visit) (Signed)
 pt at [facility] ED on 01/04/24 with constant dull H/A on rightside and topofhead;for 2 - 3 days. pt took Tylenol  H/A went away while at ED. No H/A since then. pt said BP is usually up and has not had BP cked since at ED. to discuss pending hernia surgery .pt scheduled appt with Dr Vallarie Gauze on 01/09/24 at 9 AM with UC & ED precautions given and pt voiced understanding. Sending note to Dr Vallarie Gauze.        01/07/2024  Name: William Taylor MRN: [id number] DOB: 24-Oct-1929  Today's TOC FU Call Status: Today's TOC FU Call Status:: Successful TOC FU Call Completed TOC FU Call Complete Date: 01/07/24 Patient's Name and Date of Birth confirmed.  Transition Care Management Follow-up Telephone Call Date of Discharge: 01/04/24 Discharge Facility: [facility]) Type of Discharge: Emergency Department Reason for ED Visit: Other: (pt at [facility] ED on 01/04/24 with constant dull H/A on rightside and topofhead;for 2 - 3 days. pt took Tylenol  H/A went away while at ED. No H/A since then. pt said BP is usually up and has not had BP cked since at ED.  to discuss pending hernia surgery ..) How have you been since you were released from the hospital?: Better Any questions or concerns?: No  Items Reviewed: Did you receive and understand the discharge instructions provided?: Yes Medications obtained,verified, and reconciled?: Partial Review Completed Reason for Partial Mediation Review: pt siad they did notgive pt any meds to take home., Any new allergies since your discharge?: No Dietary orders reviewed?: NA Do you have support at home?: Yes People in Home [RPT]: spouse Name of Support/Comfort Primary Source: William Taylor  Medications Reviewed Today: Medications Reviewed Today   Medications were not reviewed in this encounter     Home Care and Equipment/Supplies: Were Home Health Services Ordered?: NA Any new equipment or medical supplies ordered?: NA  Functional Questionnaire: Do you  need assistance with bathing/showering or dressing?: No Do you need assistance with meal preparation?: No Do you need assistance with eating?: No Do you have difficulty maintaining continence: No Do you need assistance with getting out of bed/getting out of a chair/moving?: No Do you have difficulty managing or taking your medications?: No  Follow up appointments reviewed: PCP Follow-up appointment confirmed?: Yes Date of PCP follow-up appointment?: 01/09/24 Follow-up Provider: Dr Tyrone Gallop [facility] Follow-up appointment confirmed?: NA Do you need transportation to your follow-up appointment?: No Do you understand care options if your condition(s) worsen?: Yes-patient verbalized understanding    SIGNATURE  Claretha Crocker, LPN

## 2024-01-07 NOTE — Telephone Encounter (Signed)
 Noted. Thanks.

## 2024-01-09 ENCOUNTER — Ambulatory Visit: Admitting: Family Medicine

## 2024-01-09 VITALS — BP 128/88 | HR 75 | Temp 97.4°F | Ht 76.0 in | Wt 187.0 lb

## 2024-01-09 DIAGNOSIS — R1012 Left upper quadrant pain: Secondary | ICD-10-CM

## 2024-01-09 DIAGNOSIS — M5481 Occipital neuralgia: Secondary | ICD-10-CM

## 2024-01-09 NOTE — Patient Instructions (Addendum)
 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  Try icing for 5 minutes at a time if needed.  May need to change pillows.  Update me as needed.  Take care.  Glad to see you.  Let me know if you have more abdominal pain or if you need help getting an appointment.

## 2024-01-09 NOTE — Progress Notes (Unsigned)
 Parking form done for patient.  He had HA for about 5 days, went to ER.  No falls.  HA improved for unclear reason.  R sided HA up the back of the scalp and top of the head.  Didn't cross midline.    CT with IMPRESSION: 1. No acute intracranial abnormality. 2. Chronic left PCA territory infarct. Stable non contrast CT appearance of the brain since last year.  He also has some L sided lower abd pain.  H/o hernia repair years ago.  "It's fine now" but some discomfort episodically with movement.  He had noted it episodically over the years.  No mass noted now. No pain noted at OV. Not ttp locally.  He is considering follow up with general surgery to see about options.

## 2024-01-12 DIAGNOSIS — M5481 Occipital neuralgia: Secondary | ICD-10-CM | POA: Insufficient documentation

## 2024-01-12 NOTE — Assessment & Plan Note (Signed)
 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  Anatomy discussed with patient.  Okay for outpatient follow-up.  He can try icing for 5 minutes at a time if needed.  May need to change pillows.  Update me as needed.

## 2024-01-12 NOTE — Assessment & Plan Note (Signed)
 At this point, okay for outpatient follow-up.  Discussed options.  He can let me know if he has more abdominal pain or if he needs help getting an appointment.

## 2024-01-20 ENCOUNTER — Ambulatory Visit: Payer: Self-pay | Admitting: General Surgery

## 2024-01-22 ENCOUNTER — Ambulatory Visit: Admitting: General Surgery

## 2024-01-22 ENCOUNTER — Encounter: Payer: Self-pay | Admitting: General Surgery

## 2024-01-22 VITALS — BP 155/84 | HR 84 | Temp 98.2°F | Ht 76.0 in | Wt 186.2 lb

## 2024-01-22 DIAGNOSIS — R1032 Left lower quadrant pain: Secondary | ICD-10-CM

## 2024-01-22 NOTE — Progress Notes (Signed)
 Patient ID: William Taylor, male   DOB: August 30, 1929, 89 y.o.   MRN: [id number] CC: Left Abdominal Pain History of Present Illness William Taylor is a 89 y.o. male with past medical history as below including coronary artery disease status post stent placement, paroxysmal A-fib on Xarelto  who presents in consultation for left abdominal pain.  The patient reports that over the last several weeks he has had pain intermittent and sharp in nature in the left side of his abdomen with some radiation down to his groin.  He has not noticed a bulge or any overlying skin changes.  He has not had any obstipation but does report that he was constipated several weeks ago but that has now resolved.  He is tolerating a normal diet.  He did go to the emergency department for this headache and also reported abdominal pain so was referred here.  He notes a history of a left inguinal hernia repair several years ago..  Past Medical History Past Medical History:  Diagnosis Date   Cancer [facility])    Prostate, followed by Dr. Aram Knights   CKD (chronic kidney disease), stage III (HCC)    Colon polyp    Coronary artery disease    a. 2005 s/p PCI LAD ([facility]); b 2012 s/p PCI OM2; c.10/2013 Cath: LAD 70m ISR, 60m, D1 90, D2 70, OM2 80 w/ patent stent;  d. 06/2019 Low risk MV; e. 07/2020 MV: EF 77%, no ischemia/infarct.   Gall stones    GERD (gastroesophageal reflux disease)    Grade I diastolic dysfunction    History of heart artery stent    Hyperlipidemia    Hypertension    Melanoma (HCC)    MI (myocardial infarction) (HCC)    2015   Orthostatic hypotension    a. prev on midodrine /florinef /northera .   PAF (paroxysmal atrial fibrillation) (HCC)    a. on xarelto  and amio (CHA2DS2VASc = 4).   Pulmonary embolism (HCC)    2006   Skin cancer    Wears dentures    partial upper and lower   Wears hearing aid in both ears        Past Surgical History:  Procedure Laterality Date   CARDIAC CATHETERIZATION  10/17/2013   [facility]    CATARACT EXTRACTION  05/22/2011   right eye   CATARACT EXTRACTION W/PHACO Left 10/01/2023   Procedure: CATARACT EXTRACTION PHACO AND INTRAOCULAR LENS PLACEMENT (IOC) LEFT 23.44 01:56.2;  Surgeon: Annell Kidney, MD;  Location: [facility];  Service: Ophthalmology;  Laterality: Left;   colonoscopy     COLONOSCOPY W/ POLYPECTOMY  08/19/2013   Dr Leatrice Provost   COLONOSCOPY WITH PROPOFOL  N/A 08/15/2016   Procedure: COLONOSCOPY WITH PROPOFOL ;  Surgeon: Luke Salaam, MD;  Location: [facility] ENDOSCOPY;  Service: Endoscopy;  Laterality: N/A;   CORONARY ANGIOPLASTY  08/20/2007   2005; s/p stent   ENTEROSCOPY N/A 09/22/2018   Procedure: ENTEROSCOPY;  Surgeon: Selena Daily, MD;  Location: [facility] ENDOSCOPY;  Service: Gastroenterology;  Laterality: N/A;   ESOPHAGOGASTRODUODENOSCOPY (EGD) WITH PROPOFOL  N/A 08/15/2016   Procedure: ESOPHAGOGASTRODUODENOSCOPY (EGD) WITH PROPOFOL ;  Surgeon: Luke Salaam, MD;  Location: [facility] ENDOSCOPY;  Service: Endoscopy;  Laterality: N/A;   GIVENS CAPSULE STUDY N/A 09/26/2016   Procedure: GIVENS CAPSULE STUDY;  Surgeon: Luke Salaam, MD;  Location: [facility] ENDOSCOPY;  Service: Endoscopy;  Laterality: N/A;   HERNIA REPAIR  08/20/2011   LEFT HEART CATH AND CORONARY ANGIOGRAPHY N/A 04/29/2022   Procedure: LEFT HEART CATH AND CORONARY ANGIOGRAPHY;  Surgeon: Antionette Kirks  A, MD;  Location: [facility] INVASIVE CV LAB;  Service: Cardiovascular;  Laterality: N/A;   LUMBAR SPINE SURGERY     x2 in [place] in 1990's   UPPER GI ENDOSCOPY  04/19/2014   Dr Leatrice Provost    Allergies  Allergen Reactions   Bee Venom Swelling   Sulfasalazine Hives and Swelling   Sulfa Antibiotics Hives and Swelling   Warfarin     Chest pain   Warfarin And Related Other (See Comments)    Chest pain    Current Outpatient Medications  Medication Sig Dispense Refill   acetaminophen  (TYLENOL ) 500 MG tablet Take 500 mg by mouth as needed.     amiodarone  (PACERONE ) 100 MG tablet Take 1 tablet (100 mg total) by mouth  daily. 90 tablet 3   cholecalciferol (VITAMIN D3) 25 MCG (1000 UNIT) tablet Take 1,000 Units by mouth daily.     fenofibrate  micronized (ANTARA ) 130 MG capsule TAKE 1 CAPSULE BY MOUTH DAILY  BEFORE BREAKFAST 90 capsule 3   finasteride  (PROSCAR ) 5 MG tablet TAKE 1 TABLET BY MOUTH DAILY 90 tablet 3   levobunolol  (BETAGAN ) 0.5 % ophthalmic solution Place 1 drop into the right eye 2 (two) times daily.     lisinopril  (ZESTRIL ) 5 MG tablet Take 1 tablet (5 mg total) by mouth daily. 90 tablet 3   loratadine  (CLARITIN ) 10 MG tablet Take 1 tablet (10 mg total) by mouth daily. 30 tablet 1   Multiple Vitamin (MULTIVITAMIN PO) Take 1 tablet by mouth daily.     nitroGLYCERIN  (NITROSTAT ) 0.4 MG SL tablet DISSOLVE UNDER THE TONGUE 1 TABLET EVERY 5 MINUTES AS NEEDED FOR CHEST PAIN 25 tablet 3   pantoprazole  (PROTONIX ) 40 MG tablet TAKE ONE (1) TABLET BY MOUTH TWO TIMES PER DAY 90 tablet 3   pravastatin  (PRAVACHOL ) 40 MG tablet Take 1 tablet (40 mg total) by mouth at bedtime. 90 tablet 3   XARELTO  15 MG TABS tablet TAKE 1 TABLET BY MOUTH DAILY 90 tablet 3   No current facility-administered medications for this visit.    Family History Family History  Problem Relation Age of Onset   Stroke Mother    Colon cancer Father    Coronary artery disease Brother    Other Brother 65       lymes dz       Social History Social History   Tobacco Use   Smoking status: Never   Smokeless tobacco: Never  Vaping Use   Vaping status: Never Used  Substance Use Topics   Alcohol use: No   Drug use: No        ROS Full ROS of systems performed and is otherwise negative there than what is stated in the HPI  Physical Exam Blood pressure (!) 155/84, pulse 84, temperature 98.2 F (36.8 C), temperature source Oral, height 6\' 4"  (1.93 m), weight 186 lb 3.2 oz (84.5 kg), SpO2 98%. Alert and oriented x 3, normal work of breathing on room air, regular rate and rhythm, abdomen is soft, nontender and nondistended.  On  his left groin there does feel like there may be a hernia defect but I do not feel a bulge.  On the right groin I do not feel any bulge or hernia defect.  His left arm is in a cast after sustaining a fall.  Data Reviewed I have reviewed his CT scan from 2023 that does show a small left inguinal hernia with fat in it.  I have personally reviewed the patient's imaging and  medical records.    Assessment/Plan    Patient who is a 89 year old with significant medical comorbidities including coronary artery disease and A-fib who is on anticoagulation the reports left abdominal pain.  I discussed with him that I do not think that his pain is secondary to his hernia.  I also discussed with the patient and his wife about how aggressive that he wants to be.  He reports that if he does have a hernia recurrence that he would like it repaired but is open to hearing the risks of the surgery.  Regardless I would like to get an ultrasound as I would like to see if there is any herniation of bowel within the hernia sac.  We will get an ultrasound and discuss surgical options once this is done.      Barrett Lick 01/22/2024, 12:23 PM

## 2024-01-22 NOTE — Patient Instructions (Addendum)
 We have you scheduled for a ultrasound of your left groin area at [facility] on Friday 01/30/24. Arrive at 9:15am for a 9:30am appointment. If you need to reschedule this appointment you may do so by calling centralized scheduling at [phone]

## 2024-01-30 ENCOUNTER — Ambulatory Visit
Admission: RE | Admit: 2024-01-30 | Discharge: 2024-01-30 | Disposition: A | Discharge status: Home / Self Care | Source: Ambulatory Visit | Attending: General Surgery | Admitting: General Surgery

## 2024-01-30 DIAGNOSIS — R1032 Left lower quadrant pain: Secondary | ICD-10-CM | POA: Insufficient documentation

## 2024-02-12 ENCOUNTER — Ambulatory Visit: Payer: Self-pay | Admitting: General Surgery

## 2024-02-12 ENCOUNTER — Encounter: Payer: Self-pay | Admitting: General Surgery

## 2024-02-12 ENCOUNTER — Ambulatory Visit: Admitting: General Surgery

## 2024-02-12 ENCOUNTER — Telehealth: Payer: Self-pay

## 2024-02-12 ENCOUNTER — Telehealth: Payer: Self-pay | Admitting: General Surgery

## 2024-02-12 VITALS — BP 154/76 | HR 86 | Temp 97.5°F | Ht 76.0 in | Wt 186.0 lb

## 2024-02-12 DIAGNOSIS — K4090 Unilateral inguinal hernia, without obstruction or gangrene, not specified as recurrent: Secondary | ICD-10-CM | POA: Diagnosis not present

## 2024-02-12 DIAGNOSIS — R1032 Left lower quadrant pain: Secondary | ICD-10-CM

## 2024-02-12 NOTE — Telephone Encounter (Signed)
   Pre-operative Risk Assessment    Patient Name: William Taylor  DOB: 05-16-1930 MRN: [id number]   Date of last office visit: 05/30/23 [name], MD Date of next office visit: NONE   Request for Surgical Clearance    Procedure:  OPEN INGUINAL HERNIA REPAIR  Date of Surgery:  Clearance 02/27/24                                Surgeon:  DR JAYSON ENDOW Surgeon's Group or Practice Name:  [facility] Phone number:  [phone] Fax number:  [phone]   Type of Clearance Requested:   - Medical  - Pharmacy:  Hold Rivaroxaban  (Xarelto )     Type of Anesthesia:  General    Additional requests/questions:    SignedLucie DELENA Ku   02/12/2024, 11:20 AM

## 2024-02-12 NOTE — Progress Notes (Signed)
 Outpatient Surgical Follow Up  02/12/2024  William Taylor is an 89 y.o. male.   Chief Complaint  Patient presents with   Follow-up    Left groin pain    HPI: The patient returns today to discuss his left inguinal hernia.  He reports that he continues to have pain that starts around his umbilicus and radiates down to his groin.  He says that this is intermittent and random.  He denies that he has had any worry some hernia signs including no obstipation, overlying skin changes, nausea or vomiting.  He did undergo an ultrasound which I independently reviewed that showed a fat-containing right inguinal hernia.  Past Medical History:  Diagnosis Date   Cancer [facility])    Prostate, followed by Dr. Ike   CKD (chronic kidney disease), stage III [facility])    Colon polyp    Coronary artery disease    a. 2005 s/p PCI LAD ([facility]); b 2012 s/p PCI OM2; c.10/2013 Cath: LAD 74m ISR, 70m, D1 90, D2 70, OM2 80 w/ patent stent;  d. 06/2019 Low risk MV; e. 07/2020 MV: EF 77%, no ischemia/infarct.   Gall stones    GERD (gastroesophageal reflux disease)    Grade I diastolic dysfunction    History of heart artery stent    Hyperlipidemia    Hypertension    Melanoma (HCC)    MI (myocardial infarction) (HCC)    2015   Orthostatic hypotension    a. prev on midodrine /florinef /northera .   PAF (paroxysmal atrial fibrillation) (HCC)    a. on xarelto  and amio (CHA2DS2VASc = 4).   Pulmonary embolism (HCC)    2006   Skin cancer    Wears dentures    partial upper and lower   Wears hearing aid in both ears     Past Surgical History:  Procedure Laterality Date   CARDIAC CATHETERIZATION  10/17/2013   [facility]   CATARACT EXTRACTION  05/22/2011   right eye   CATARACT EXTRACTION W/PHACO Left 10/01/2023   Procedure: CATARACT EXTRACTION PHACO AND INTRAOCULAR LENS PLACEMENT (IOC) LEFT 23.44 01:56.2;  Surgeon: Mittie Gaskin, MD;  Location: [facility];  Service: Ophthalmology;  Laterality: Left;    colonoscopy     COLONOSCOPY W/ POLYPECTOMY  08/19/2013   Dr Jeri   COLONOSCOPY WITH PROPOFOL  N/A 08/15/2016   Procedure: COLONOSCOPY WITH PROPOFOL ;  Surgeon: Ruel Kung, MD;  Location: [facility] ENDOSCOPY;  Service: Endoscopy;  Laterality: N/A;   CORONARY ANGIOPLASTY  08/20/2007   2005; s/p stent   ENTEROSCOPY N/A 09/22/2018   Procedure: ENTEROSCOPY;  Surgeon: Unk Corinn Skiff, MD;  Location: [facility] ENDOSCOPY;  Service: Gastroenterology;  Laterality: N/A;   ESOPHAGOGASTRODUODENOSCOPY (EGD) WITH PROPOFOL  N/A 08/15/2016   Procedure: ESOPHAGOGASTRODUODENOSCOPY (EGD) WITH PROPOFOL ;  Surgeon: Ruel Kung, MD;  Location: [facility] ENDOSCOPY;  Service: Endoscopy;  Laterality: N/A;   GIVENS CAPSULE STUDY N/A 09/26/2016   Procedure: GIVENS CAPSULE STUDY;  Surgeon: Ruel Kung, MD;  Location: [facility] ENDOSCOPY;  Service: Endoscopy;  Laterality: N/A;   HERNIA REPAIR  08/20/2011   LEFT HEART CATH AND CORONARY ANGIOGRAPHY N/A 04/29/2022   Procedure: LEFT HEART CATH AND CORONARY ANGIOGRAPHY;  Surgeon: Darron Deatrice LABOR, MD;  Location: [facility] INVASIVE CV LAB;  Service: Cardiovascular;  Laterality: N/A;   LUMBAR SPINE SURGERY     x2 in [facility] in 1990's   UPPER GI ENDOSCOPY  04/19/2014   Dr Jeri    Family History  Problem Relation Age of Onset   Stroke Mother    Colon cancer  Father    Coronary artery disease Brother    Other Brother 79       lymes dz    Social History:  reports that he has never smoked. He has never used smokeless tobacco. He reports that he does not drink alcohol and does not use drugs.  Allergies:  Allergies  Allergen Reactions   Bee Venom Swelling   Sulfasalazine Hives and Swelling   Sulfa Antibiotics Hives and Swelling   Warfarin     Chest pain   Warfarin And Related Other (See Comments)    Chest pain    Medications reviewed.    ROS Full ROS performed and is otherwise negative other than what is stated in HPI   BP (!) 154/76   Pulse 86   Temp (!) 97.5 F (36.4 C) (Oral)   Ht  6' 4 (1.93 m)   Wt 186 lb (84.4 kg)   SpO2 97%   BMI 22.64 kg/m   Physical Exam Patient is alert and oriented.  He is walking with the assistance of a cane, moving all extremities spontaneously, abdomen is soft, nontender and nondistended, left groin is difficult to feel a bulge but he has a defect consistent with inguinal hernia, there is a very well-healed surgical scar that is barely visible in the left groin.    Independently reviewed his ultrasound that shows a left fat-containing inguinal hernia. Assessment/Plan:  Patient with fat-containing left inguinal hernia.  I discussed with him that it is his choice whether he wants a repair.  We talked about the options if we do not have repaired and that the hernia will not go away.  I discussed risk, benefits alternatives of repairing the hernia including risk of infection, bleeding which she is at increased risk for given his anticoagulation, pulmonary and cardiac problems, damage to the vas deferens, testicular ischemia, chronic pain and recurrence.  He I also discussed with him that given his age and medical comorbidities I would not entertain a minimally invasive repair but would do this open.  After a lengthy discussion about the options he is agreed to undergo surgery.  We will tentatively place him on the OR schedule for 11 July.  We will have him undergo cardiac risk stratification.  He will need to hold his anticoagulation 3 days prior to his procedure.   Jayson Endow, M.D. [facility]

## 2024-02-12 NOTE — Telephone Encounter (Signed)
 Faxed cardiac clearance to Dr. Timothy Gollan at [phone].

## 2024-02-12 NOTE — Patient Instructions (Signed)
 You have chose to have your hernia repaired. This will be done by Dr. Cornel Diesel at [facility].  If you are on any injectable weight loss medication, you will need to stop taking your GLP-1 injectable (weight loss) medications 8 days before your surgery to avoid any complications with anesthesia.   Please see your (blue) Pre-care information that you have been given today. Our surgery scheduler will call you to verify surgery date and to go over information.   You will need to arrange to be out of work for approximately 1-2 weeks and then you may return with a lifting restriction for 4 more weeks. If you have FMLA or Disability paperwork that needs to be filled out, please have your company fax your paperwork to [phone] or you may drop this by either office. This paperwork will be filled out within 3 days after your surgery has been completed.  You may have a bruise in your groin and also swelling and brusing in your testicle area. You may use ice 4-5 times daily for 15-20 minutes each time. Make sure that you place a barrier between you and the ice pack. To decrease the swelling, you may roll up a bath towel and place it vertically in between your thighs with your testicles resting on the towel. You will want to keep this area elevated as much as possible for several days following surgery.    Inguinal Hernia, Adult Muscles help keep everything in the body in its proper place. But if a weak spot in the muscles develops, something can poke through. That is called a hernia. When this happens in the lower part of the belly (abdomen), it is called an inguinal hernia. (It takes its name from a part of the body in this region called the inguinal canal.) A weak spot in the wall of muscles lets some fat or part of the small intestine bulge through. An inguinal hernia can develop at any age. Men get them more often than women. CAUSES  In adults, an inguinal hernia develops over time. It can be  triggered by: Suddenly straining the muscles of the lower abdomen. Lifting heavy objects. Straining to have a bowel movement. Difficult bowel movements (constipation) can lead to this. Constant coughing. This may be caused by smoking or lung disease. Being overweight. Being pregnant. Working at a job that requires long periods of standing or heavy lifting. Having had an inguinal hernia before. One type can be an emergency situation. It is called a strangulated inguinal hernia. It develops if part of the small intestine slips through the weak spot and cannot get back into the abdomen. The blood supply can be cut off. If that happens, part of the intestine may die. This situation requires emergency surgery. SYMPTOMS  Often, a small inguinal hernia has no symptoms. It is found when a healthcare provider does a physical exam. Larger hernias usually have symptoms.  In adults, symptoms may include: A lump in the groin. This is easier to see when the person is standing. It might disappear when lying down. In men, a lump in the scrotum. Pain or burning in the groin. This occurs especially when lifting, straining or coughing. A dull ache or feeling of pressure in the groin. Signs of a strangulated hernia can include: A bulge in the groin that becomes very painful and tender to the touch. A bulge that turns red or purple. Fever, nausea and vomiting. Inability to have a bowel movement or to pass gas.  DIAGNOSIS  To decide if you have an inguinal hernia, a healthcare provider will probably do a physical examination. This will include asking questions about any symptoms you have noticed. The healthcare provider might feel the groin area and ask you to cough. If an inguinal hernia is felt, the healthcare provider may try to slide it back into the abdomen. Usually no other tests are needed. TREATMENT  Treatments can vary. The size of the hernia makes a difference. Options include: Watchful waiting. This  is often suggested if the hernia is small and you have had no symptoms. No medical procedure will be done unless symptoms develop. You will need to watch closely for symptoms. If any occur, contact your healthcare provider right away. Surgery. This is used if the hernia is larger or you have symptoms. Open surgery. This is usually an outpatient procedure (you will not stay overnight in a hospital). An cut (incision) is made through the skin in the groin. The hernia is put back inside the abdomen. The weak area in the muscles is then repaired by herniorrhaphy or hernioplasty. Herniorrhaphy: in this type of surgery, the weak muscles are sewn back together. Hernioplasty: a patch or mesh is used to close the weak area in the abdominal wall. Laparoscopy. In this procedure, a surgeon makes small incisions. A thin tube with a tiny video camera (called a laparoscope) is put into the abdomen. The surgeon repairs the hernia with mesh by looking with the video camera and using two long instruments. HOME CARE INSTRUCTIONS  After surgery to repair an inguinal hernia: You will need to take pain medicine prescribed by your healthcare provider. Follow all directions carefully. You will need to take care of the wound from the incision. Your activity will be restricted for awhile. This will probably include no heavy lifting for several weeks. You also should not do anything too active for a few weeks. When you can return to work will depend on the type of job that you have. During "watchful waiting" periods, you should: Maintain a healthy weight. Eat a diet high in fiber (fruits, vegetables and whole grains). Drink plenty of fluids to avoid constipation. This means drinking enough water and other liquids to keep your urine clear or pale yellow. Do not lift heavy objects. Do not stand for long periods of time. Quit smoking. This should keep you from developing a frequent cough. SEEK MEDICAL CARE IF:  A bulge  develops in your groin area. You feel pain, a burning sensation or pressure in the groin. This might be worse if you are lifting or straining. You develop a fever of more than 100.5 F (38.1 C). SEEK IMMEDIATE MEDICAL CARE IF:  Pain in the groin increases suddenly. A bulge in the groin gets bigger suddenly and does not go down. For men, there is sudden pain in the scrotum. Or, the size of the scrotum increases. A bulge in the groin area becomes red or purple and is painful to touch. You have nausea or vomiting that does not go away. You feel your heart beating much faster than normal. You cannot have a bowel movement or pass gas. You develop a fever of more than 102.0 F (38.9 C).   This information is not intended to replace advice given to you by your health care provider. Make sure you discuss any questions you have with your health care provider.   Document Released: 12/22/2008 Document Revised: 10/28/2011 Document Reviewed: 02/06/2015 Elsevier Interactive Patient Education [employer]

## 2024-02-12 NOTE — Telephone Encounter (Signed)
 Patient has been advised of Pre-Admission date/time, and Surgery date at [facility].  Surgery Date: 02/27/24 Preadmission Testing Date: 02/17/24 (phone 1p-4p)  Patient informed of the scheduling process and surgery information given at time of office visit.   Patient has been made aware to call [phone], between 1-3:00pm the day before surgery, to find out what time to arrive for surgery.

## 2024-02-16 NOTE — Telephone Encounter (Signed)
   Name: William Taylor  DOB: 05-02-1930  MRN: [id number]  Primary Cardiologist: None   Preoperative team, please contact this patient and set up a phone call appointment for further preoperative risk assessment. Please obtain consent and complete medication review. Thank you for your help.  I confirm that guidance regarding antiplatelet and oral anticoagulation therapy has been completed and, if necessary, noted below.  Per office protocol, patient can hold Xarelto  for 2 days prior to procedure.    I also confirmed the patient resides in the state of [place] . As per [facility] Medical Board telemedicine laws, the patient must reside in the state in which the provider is licensed.   Wyn Raddle, Jackee Shove, NP 02/16/2024, 11:47 AM [facility]

## 2024-02-16 NOTE — Telephone Encounter (Signed)
 Patient with diagnosis of afib on Xarelto  for anticoagulation.    Procedure:  OPEN INGUINAL HERNIA REPAIR  Date of procedure: 02/27/24   CHA2DS2-VASc Score = 4   This indicates a 4.8% annual risk of stroke. The patient's score is based upon: CHF History: 0 HTN History: 1 Diabetes History: 0 Stroke History: 0 Vascular Disease History: 1 Age Score: 2 Gender Score: 0      CrCl 48 ml/min Platelet count 220  Patient has not had an Afib/aflutter ablation within the last 3 months or DCCV within the last 30 days  Per office protocol, patient can hold Xarelto  for 2 days prior to procedure.    **This guidance is not considered finalized until pre-operative APP has relayed final recommendations.**

## 2024-02-17 ENCOUNTER — Encounter
Admission: RE | Admit: 2024-02-17 | Discharge: 2024-02-17 | Disposition: A | Discharge status: Home / Self Care | Source: Ambulatory Visit | Attending: General Surgery | Admitting: General Surgery

## 2024-02-17 ENCOUNTER — Telehealth: Payer: Self-pay

## 2024-02-17 ENCOUNTER — Other Ambulatory Visit: Payer: Self-pay

## 2024-02-17 VITALS — Ht 76.0 in | Wt 186.0 lb

## 2024-02-17 DIAGNOSIS — I10 Essential (primary) hypertension: Secondary | ICD-10-CM

## 2024-02-17 HISTORY — DX: Dyspnea, unspecified: R06.00

## 2024-02-17 HISTORY — DX: Atherosclerotic heart disease of native coronary artery without angina pectoris: I25.10

## 2024-02-17 HISTORY — DX: Other symptoms and signs involving the musculoskeletal system: R29.898

## 2024-02-17 HISTORY — DX: Orthostatic hypotension: I95.1

## 2024-02-17 HISTORY — DX: Mixed hyperlipidemia: E78.2

## 2024-02-17 HISTORY — DX: Cardiac arrhythmia, unspecified: I49.9

## 2024-02-17 HISTORY — DX: Anemia, unspecified: D64.9

## 2024-02-17 HISTORY — DX: Iron deficiency anemia, unspecified: D50.9

## 2024-02-17 HISTORY — DX: Unspecified atrial fibrillation: I48.91

## 2024-02-17 NOTE — Patient Instructions (Addendum)
 Your procedure is scheduled on: Friday 02/27/24  Report to the Registration Desk on the 1st floor of the Medical Mall. To find out your arrival time, please call [phone] between 1PM - 3PM on: Thursday 02/26/24  If your arrival time is 6:00 am, do not arrive before that time as the Medical Mall entrance doors do not open until 6:00 am.  REMEMBER: Instructions that are not followed completely may result in serious medical risk, up to and including death; or upon the discretion of your surgeon and anesthesiologist your surgery may need to be rescheduled.  Do not eat food after midnight the night before surgery.  No gum chewing or hard candies.  You may however, drink CLEAR liquids up to 2 hours before you are scheduled to arrive for your surgery. Do not drink anything within 2 hours of your scheduled arrival time.  Clear liquids include: - water  - apple juice without pulp - black coffee or tea (Do NOT add milk or creamers to the coffee or tea) Do NOT drink anything that is not on this list.   One week prior to surgery:Stop these 02/20/24  Stop Anti-inflammatories (NSAIDS) such as Advil, Aleve , Ibuprofen, Motrin, Naproxen , Naprosyn  and Aspirin  based products such as Excedrin, Goody's Powder, BC Powder. Stop ANY OVER THE COUNTER supplements until after surgery. Multiple Vitamin (MULTIVITAMIN PO)  cholecalciferol (VITAMIN D3) 25 MCG (1000 UNITS)   You may however, continue to take Tylenol  if needed for pain up until the day of surgery. Stop XARELTO  15 MG TABS 2 days prior to surgery, take last dose Tuesday 02/24/24 as instructed by your doctor. Continue taking all of your other prescription medications up until the day of surgery.  ON THE DAY OF SURGERY ONLY TAKE THESE MEDICATIONS WITH SIPS OF WATER:  amiodarone  (PACERONE ) 100 MG  fenofibrate  micronized (ANTARA ) 130 MG  pantoprazole  (PROTONIX ) 40 MG   Use inhalers on the day of surgery and bring to the hospital.  No Alcohol for 24  hours before or after surgery.  No Smoking including e-cigarettes for 24 hours before surgery.  No chewable tobacco products for at least 6 hours before surgery.  No nicotine patches on the day of surgery.  Do not use any recreational drugs for at least a week (preferably 2 weeks) before your surgery.  Please be advised that the combination of cocaine and anesthesia may have negative outcomes, up to and including death. If you test positive for cocaine, your surgery will be cancelled.  On the morning of surgery brush your teeth with toothpaste and water, you may rinse your mouth with mouthwash if you wish. Do not swallow any toothpaste or mouthwash.  Use CHG Soap or wipes as directed on instruction sheet.  Do not wear jewelry, make-up, hairpins, clips or nail polish.  For welded (permanent) jewelry: bracelets, anklets, waist bands, etc.  Please have this removed prior to surgery.  If it is not removed, there is a chance that hospital personnel will need to cut it off on the day of surgery.  Do not wear lotions, powders, or perfumes.   Do not shave body hair from the neck down 48 hours before surgery.  Contact lenses, hearing aids and dentures may not be worn into surgery.  Do not bring valuables to the hospital. [facility] is not responsible for any missing/lost belongings or valuables.   Notify your doctor if there is any change in your medical condition (cold, fever, infection).  Wear comfortable clothing (specific to  your surgery type) to the hospital.  After surgery, you can help prevent lung complications by doing breathing exercises.  Take deep breaths and cough every 1-2 hours. Your doctor may order a device called an Incentive Spirometer to help you take deep breaths. When coughing or sneezing, hold a pillow firmly against your incision with both hands. This is called "splinting." Doing this helps protect your incision. It also decreases belly discomfort.  If you are  being admitted to the hospital overnight, leave your suitcase in the car. After surgery it may be brought to your room.  In case of increased patient census, it may be necessary for you, the patient, to continue your postoperative care in the Same Day Surgery department.  If you are being discharged the day of surgery, you will not be allowed to drive home. You will need a responsible individual to drive you home and stay with you for 24 hours after surgery.   If you are taking public transportation, you will need to have a responsible individual with you.  Please call the Pre-admissions Testing Dept. at [phone] if you have any questions about these instructions.  Surgery Visitation Policy:  Patients having surgery or a procedure may have two visitors.  Children under the age of 88 must have an adult with them who is not the patient.  Inpatient Visitation:    Visiting hours are 7 a.m. to 8 p.m. Up to four visitors are allowed at one time in a patient room. The visitors may rotate out with other people during the day.  One visitor age 88 or older may stay with the patient overnight and must be in the room by 8 p.m.  Merchandiser, retail to address health-related social needs:  [email or web address]     Preparing for Surgery with CHLORHEXIDINE GLUCONATE (CHG) Soap  Chlorhexidine Gluconate (CHG) Soap  o An antiseptic cleaner that kills germs and bonds with the skin to continue killing germs even after washing  o Used for showering the night before surgery and morning of surgery  Before surgery, you can play an important role by reducing the number of germs on your skin.  CHG (Chlorhexidine gluconate) soap is an antiseptic cleanser which kills germs and bonds with the skin to continue killing germs even after washing.  Please do not use if you have an allergy to CHG or antibacterial soaps. If your skin becomes reddened/irritated stop using the CHG.  1.  Shower the NIGHT BEFORE SURGERY and the MORNING OF SURGERY with CHG soap.  2. If you choose to wash your hair, wash your hair first as usual with your normal shampoo.  3. After shampooing, rinse your hair and body thoroughly to remove the shampoo.  4. Use CHG as you would any other liquid soap. You can apply CHG directly to the skin and wash gently with a scrungie or a clean washcloth.  5. Apply the CHG soap to your body only from the neck down. Do not use on open wounds or open sores. Avoid contact with your eyes, ears, mouth, and genitals (private parts). Wash face and genitals (private parts) with your normal soap.  6. Wash thoroughly, paying special attention to the area where your surgery will be performed.  7. Thoroughly rinse your body with warm water.  8. Do not shower/wash with your normal soap after using and rinsing off the CHG soap.  9. Pat yourself dry with a clean towel.  10. Wear clean pajamas to bed the night  before surgery.  12. Place clean sheets on your bed the night of your first shower and do not sleep with pets.  13. Shower again with the CHG soap on the day of surgery prior to arriving at the hospital.  14. Do not apply any deodorants/lotions/powders.  15. Please wear clean clothes to the hospital.

## 2024-02-17 NOTE — Telephone Encounter (Signed)
Patient has been scheduled for televisit.

## 2024-02-17 NOTE — Telephone Encounter (Signed)
 Patient has been scheduled and consent done     Patient Consent for Virtual Visit         William Taylor has provided verbal consent on 02/17/2024 for a virtual visit (video or telephone).   CONSENT FOR VIRTUAL VISIT FOR:  William Taylor  By participating in this virtual visit I agree to the following:  I hereby voluntarily request, consent and authorize [facility] and its employed or contracted physicians, physician assistants, nurse practitioners or other licensed health care professionals (the Practitioner), to provide me with telemedicine health care services (the "Services) as deemed necessary by the treating Practitioner. I acknowledge and consent to receive the Services by the Practitioner via telemedicine. I understand that the telemedicine visit will involve communicating with the Practitioner through live audiovisual communication technology and the disclosure of certain medical information by electronic transmission. I acknowledge that I have been given the opportunity to request an in-person assessment or other available alternative prior to the telemedicine visit and am voluntarily participating in the telemedicine visit.  I understand that I have the right to withhold or withdraw my consent to the use of telemedicine in the course of my care at any time, without affecting my right to future care or treatment, and that the Practitioner or I may terminate the telemedicine visit at any time. I understand that I have the right to inspect all information obtained and/or recorded in the course of the telemedicine visit and may receive copies of available information for a reasonable fee.  I understand that some of the potential risks of receiving the Services via telemedicine include:  Delay or interruption in medical evaluation due to technological equipment failure or disruption; Information transmitted may not be sufficient (e.g. poor resolution of images) to allow for  appropriate medical decision making by the Practitioner; and/or  In rare instances, security protocols could fail, causing a breach of personal health information.  Furthermore, I acknowledge that it is my responsibility to provide information about my medical history, conditions and care that is complete and accurate to the best of my ability. I acknowledge that Practitioner's advice, recommendations, and/or decision may be based on factors not within their control, such as incomplete or inaccurate data provided by me or distortions of diagnostic images or specimens that may result from electronic transmissions. I understand that the practice of medicine is not an exact science and that Practitioner makes no warranties or guarantees regarding treatment outcomes. I acknowledge that a copy of this consent can be made available to me via my patient portal [facility] MyChart), or I can request a printed copy by calling the office of [facility].    I understand that my insurance will be billed for this visit.   I have read or had this consent read to me. I understand the contents of this consent, which adequately explains the benefits and risks of the Services being provided via telemedicine.  I have been provided ample opportunity to ask questions regarding this consent and the Services and have had my questions answered to my satisfaction. I give my informed consent for the services to be provided through the use of telemedicine in my medical care

## 2024-02-18 ENCOUNTER — Encounter
Admission: RE | Admit: 2024-02-18 | Discharge: 2024-02-18 | Disposition: A | Discharge status: Home / Self Care | Source: Ambulatory Visit | Attending: General Surgery | Admitting: General Surgery

## 2024-02-18 DIAGNOSIS — I10 Essential (primary) hypertension: Secondary | ICD-10-CM | POA: Diagnosis not present

## 2024-02-18 DIAGNOSIS — Z01810 Encounter for preprocedural cardiovascular examination: Secondary | ICD-10-CM | POA: Diagnosis present

## 2024-02-24 ENCOUNTER — Encounter: Payer: Self-pay | Admitting: General Surgery

## 2024-02-25 ENCOUNTER — Encounter: Payer: Self-pay | Admitting: General Surgery

## 2024-02-25 ENCOUNTER — Ambulatory Visit: Attending: Cardiovascular Disease | Admitting: Nurse Practitioner

## 2024-02-25 DIAGNOSIS — Z01810 Encounter for preprocedural cardiovascular examination: Secondary | ICD-10-CM

## 2024-02-25 NOTE — Progress Notes (Incomplete)
 Perioperative / Anesthesia Services  Pre-Admission Testing Clinical Review / Pre-Operative Anesthesia Consult  Date: 02/25/24  PATIENT DEMOGRAPHICS: Name: William Taylor DOB: 1929/11/11 MRN:   [id number]  Note: Available PAT nursing documentation and vital signs have been reviewed. Clinical nursing staff has updated patient's PMH/PSHx, current medication list, and drug allergies/intolerances to ensure complete and comprehensive history available to assist care teams in MDM as it pertains to the aforementioned surgical procedure and anticipated anesthetic course. Extensive review of available clinical information personally performed. [place] PMH and PSHx updated with any diagnoses/procedures that  may have been inadvertently omitted during his intake with the pre-admission testing department's nursing staff.  PLANNED SURGICAL PROCEDURE(S):   Case: [id number] Date/Time: 02/27/24 0915   Procedure: REPAIR, HERNIA, INGUINAL, ADULT (Left) - open procedure with mesh   Anesthesia type: General   Diagnosis: Left inguinal hernia [K40.90]   Pre-op diagnosis: Left inguinal hernia   Location: [facility] OR ROOM 06 / [facility]   Surgeons: Marinda Jayson KIDD, MD        CLINICAL DISCUSSION: William Taylor is a 89 y.o. male who is submitted for pre-surgical anesthesia review and clearance prior to him undergoing the above procedure. Patient has never been a smoker in the past. 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  Patient is followed by cardiology (Gollan, MD). He was last seen in the cardiology clinic on 05/30/2019 for; notes  reviewed. At the time of his clinic visit, patient doing well overall from a cardiovascular perspective.  Patient with complaints of chronic chest pain and exertional dyspnea.  Both symptoms reportedly stable and at baseline.  Patient denied any PND, orthopnea, palpitations, significant peripheral edema, weakness, fatigue, vertiginous symptoms, or presyncope/syncope. Patient with a past medical history significant for cardiovascular diagnoses. Documented physical exam was grossly benign, providing no evidence of acute exacerbation and/or decompensation of the patient's known cardiovascular conditions.  Of note, complete records regarding patient's cardiovascular history unavailable for review at time of consult.  Patient has received care in [place], [place].  Information gathered from patient report and from notes provided by his local specialty care providers.  Patient reported to have undergone PCI back in 2005 while living in [place] .  Cypher DES placed to the LAD at that time.  Repeat diagnostic LEFT heart catheterization was performed in 2009, again in absence of William Taylor.  Study revealed no obstructive coronary artery disease.  Previously placed LAD stent was noted to be widely patent.  Patient suffered an NSTEMI on 12/24/2010.  Diagnostic LEFT heart catheterization was performed revealing multivessel CAD; 30% LAD, 30% ISR mid LAD, 50% distal LAD, and 95% OM2.  Given the degree and complexity of patient's coronary artery disease, patient was to be sent to [facility] for PCI due to distal bifurcation lesion and inability to visualize RCA.  Patient ultimately underwent PCI on 12/26/2010 placing a 2.25 x 16 mm Promus Element DES x 1 to the OM2 lesion.  Procedure yielded excellent angiographic result and TIMI-3 flow.  In the setting of patient holding his clopidogrel  for a routine colonoscopy, patient suffered a second NSTEMI on 10/26/2013.  Troponins were trended: 1.00 -- 5.10  --6.20 ng/mL.  Patient underwent diagnostic LEFT  heart catheterization on 10/28/2013 revealing multivessel CAD; 10% ISR mid LAD-1, 40% mid LAD-2, 90% D1-1, 70% D1-2, and 80% OM1.  Previously placed stent to OM2 widely patent.  Interventional cardiology made the decision to defer further intervention opting for medical management of patient's coronary artery disease.  Long-term cardiac event monitor study performed on 07/21/2019 revealing a predominant underlying sinus rhythm at an average rate of 85 bpm.  There were 6 runs of SVT/atrial tachycardia with the fastest lasting 7 beats at a max rate of 197 bpm, and the longest lasting 18 beats at an average rate of 126 bpm.  There were isolated PACs accounting for 1.2% of the total study burden (80,085).  PAC couplets and triplets will present but rare.  Ventricular ectopy also noted to be rare.  Patient triggered events were not associated with any significant arrhythmia.  Most recent myocardial perfusion imaging study was performed on 08/17/2020 revealing a normal left ventricular systolic function with a hyperdynamic LVEF of 77%. There were no regional wall motion abnormalities.  No artifact or left ventricular cavity size enlargement appreciated on review of imaging. SPECT images demonstrated no evidence of stress-induced myocardial ischemia or arrhythmia; no scintigraphic evidence of scar.  TID ratio = 1.0. Study determined to be normal and low risk.  Most recent TTE performed on 07/28/2021 revealed a normal left ventricular systolic function with an EF of 60-65%.  There was severe asymmetric basal septal hypertrophy measuring up to 1.6 cm.  No SAM or LVOT obstruction.  Left ventricular diastolic Doppler parameters consistent with abnormal relaxation (G1DD). Right ventricular size and function normal with a TAPSE measuring 2.8 cm (normal range >/= 1.6 cm). There was no significant valvular regurgitation.  All transvalvular gradients were noted to be normal  providing no evidence of hemodynamically significant valvular stenosis.  Aortic root mildly dilated measuring up to 43 mm.  Ascending aorta also mildly dilated measuring up to 38 mm.  Cardiac MRI was performed on 11/13/2020 revealing a normal left ventricular systolic function with an EF of 56%.  There were no regional wall motion abnormalities.  Again severe asymmetric septal hypertrophy of the basal septum measuring up to 1.6 cm was noted.  There was no LVOT obstruction.  No LGE in the left ventricular myocardium to suggest prior significant myocardial infarction, scar, or infiltrative process.  Right ventricular size and function was normal with an RVEF of 58%.  Findings consistent with nonobstructive hypertrophic cardiomyopathy.  Given patient's age and lack of risk factors, no additional therapy or management was recommended.   Repeat diagnostic LEFT heart catheterization was performed on 04/29/2022 revealing multivessel CAD; 90% proximal RCA, 30% mid to distal RCA, 10% OM 3, 60% distal LAD, 20% mid LAD, 20% mid to distal LAD, and 20% proximal to mid LAD.  Due to CKD, left ventricular angiography was not performed.  LVEDP was mildly elevated.  Given the nonobstructive nature of patient's coronary artery disease, and the presence of widely patent LAD and LCx stents, further intervention was deferred opting for continued medical management of patient's coronary artery disease.  Multiple imaging studies dating back to at least 2020 demonstrate age indeterminate infarcts in the RIGHT cerebellum and LEFT parietal lobe.  Dates of neurological insult unknown.  Patient has no significant residual neurological deficits per his report.  Patient with an atrial fibrillation diagnosis; CHA2DS2-VASc Score = 6 (age x 2, HTN, CVA/PE x 2, prior MI/vascular disease). His rate and rhythm are currently being maintained on oral amiodarone . He is chronically  anticoagulated using rivaroxaban ; reported to be compliant with  therapy with no evidence or reports of GI/GU bleeding.  Blood pressure well controlled at 124/80 mmHg on currently prescribed ACEi (lisinopril ) monotherapy. He is on fenofibrate  + pravastatin  for his HLD diagnosis and further ASCVD prevention.  Patient has a supply of short acting nitrates (NTG) to use on a as needed basis for recurrent angina/anginal equivalent symptoms; denied recent use.  Patient is not diabetic.  Patient does not have an OSAH diagnosis.  Functional capacity somewhat limited by patient's age and multiple medical comorbidities.  With that said, patient is able to complete all of his ADLs/IADLs without cardiovascular limitation.  Per the DASI, patient is able to achieve at least 4 METS of physical activity without experiencing any significant degrees of angina/anginal equivalent symptoms.  No changes were made to his medication regimen.  Patient to follow-up with outpatient cardiology in 1 year or sooner if needed.  William Taylor is scheduled for an elective REPAIR, HERNIA, INGUINAL, ADULT (Left) on 02/27/2024 with Dr. Jayson Endow, MD.  Given patient's past medical history significant for cardiovascular diagnoses, presurgical cardiac clearance was sought by the PAT team.  Per cardiology, according to the Revised Cardiac Risk Index (RCRI), his Perioperative Risk of Major Cardiac Event is (%): 0.9. His Functional Capacity in METs is: 4.06 according to the [facility] Activity Status Index (DASI).Therefore, based on ACC/AHA guidelines, patient would be at acceptable risk for the planned procedure without further cardiovascular testing.  Again, this patient is on daily oral DOAC therapy (rivaroxaban ).  Patient has a history of NSTEMI precipitated by holding this medication for routine colonoscopy.  With that said, patient was cleared by cardiology to hold this medication for 2 days prior to his procedure with plans to restart as soon as postoperatively respectively minimized by his primary team and  surgeon.  The patient is aware that his last dose of rivaroxaban  should be on 02/24/2024.  Patient denies previous perioperative complications with anesthesia in the past. In review his EMR, it is noted that patient underwent a MAC anesthetic course at [facility] (ASA III) in 09/2023 without documented complications.   MOST RECENT VITAL SIGNS:    02/17/2024   11:26 AM 02/12/2024   10:54 AM 01/22/2024   10:29 AM  Vitals with BMI  Height 6' 4 6' 4 6' 4  Weight 186 lbs 186 lbs 186 lbs 3 oz  BMI 22.65 22.65 22.67  Systolic  154 155  Diastolic  76 84  Pulse  86 84   PROVIDERS/SPECIALISTS: NOTE: Primary physician provider listed below. Patient may have been seen by APP or partner within same practice.   PROVIDER ROLE / SPECIALTY LAST OV  Endow Jayson KIDD, MD General Surgery (Surgeon) 02/12/2024  [name], MD Primary Care Provider 01/09/2024  Perla Lye, MD Cardiology 05/30/2023; preop APP call 02/25/2024  Dennise Capri, MD Nephrology 09/29/2023   ALLERGIES: Allergies  Allergen Reactions   Bee Venom Swelling   Sulfasalazine Hives and Swelling   Sulfa Antibiotics Hives and Swelling   Warfarin     Chest pain   Warfarin And Related Other (See Comments)    Chest pain    CURRENT HOME MEDICATIONS: No current facility-administered medications for this encounter.    acetaminophen  (TYLENOL ) 500 MG tablet   amiodarone  (PACERONE ) 100 MG tablet   cholecalciferol (VITAMIN D3) 25 MCG (1000 UNIT) tablet   fenofibrate  micronized (ANTARA ) 130 MG capsule   finasteride  (PROSCAR ) 5 MG tablet   levobunolol  (  BETAGAN ) 0.5 % ophthalmic solution   lisinopril  (ZESTRIL ) 5 MG tablet   loratadine  (CLARITIN ) 10 MG tablet   Multiple Vitamin (MULTIVITAMIN PO)   nitroGLYCERIN  (NITROSTAT ) 0.4 MG SL tablet   pantoprazole  (PROTONIX ) 40 MG tablet   pravastatin  (PRAVACHOL ) 40 MG tablet   XARELTO  15 MG TABS tablet   HISTORY: Past Medical History:  Diagnosis Date   Acquired  dilation of ascending aorta and aortic root (HCC)    Aortic atherosclerosis (HCC)    Atypical chest pain    BPH (benign prostatic hyperplasia)    Cerebral microvascular disease    CKD (chronic kidney disease), stage III (HCC)    Colon polyp    Compression fracture of T2 vertebra (HCC) 02/26/2023   Coronary artery disease    a.) PCI LAD (Cypher DES) in 2005; b.)  LHC 2009: no obstructive CAD and patent stent; c.) PCI OM2 12/26/2010 (2.25 x 16 mm Promus Element); d.) LHC 10/28/2013: LAD 60m ISR, 34m, D1 90, D2 70, OM2 80 w/ patent stent;  e.) 06/2019 Low risk MV; f.) 07/2020 MV: EF 77%, no ischemia/infarct.   CVA (cerebral vascular accident) [facility])    a.) age indeterminate infarcts in the RIGHT cerebellum and LEFT parietal lobe   Depression    Dyspnea    Gall stones    GERD (gastroesophageal reflux disease)    Glaucoma    Hyperlipidemia    Hypertension    Hypertrophic nonobstructive cardiomyopathy (with severe asymmetric septal hypertrophy)    IDA (iron deficiency anemia)    Left inguinal hernia    Leg weakness    Long term current use of amiodarone     Melanoma (HCC)    Multinodular thyroid     NSTEMI (non-ST elevated myocardial infarction) (HCC) 10/26/2013   a.) experienced in setting of antithrombotic hold (clopidogrel ) for routine colonoscopy; b.) troponins trended: 1.00 --> 5.10 --> 6.20 ng/mL; c.) LHC 10/31/2013: 10/40% mLAD, 90/70% D1, 80% OM1 -- med mgmt   NSTEMI (non-ST elevated myocardial infarction) (HCC) 12/24/2010   a.) LHC 12/24/2010: 30 % LAD, 30% ISR mLAD, 50% dLAD, 95% OM2 --> d/t distal bifurcation lesion unable to see RCA --> transfer to [facility] --> PCI 12/26/2010 placing a 2.25 x 16 mm Promus Element DES to OM2   On rivaroxaban  therapy    Orthostatic hypotension    a. prev on midodrine /florinef /northera .   PAF (paroxysmal atrial fibrillation) (HCC)    a.) CHA2DS2VASc = 6 (age x2, HTN, CVA/PE x 2, prior MI/vascular disease) as of 02/24/2024; b.) rate/rhythm maintained on  oral amiodarone ; chronically anticoagulated with rivaroxaban    Prostate cancer (HCC)    PSVT (paroxysmal supraventricular tachycardia) (HCC)    Pulmonary embolism (HCC) 2006   Skin cancer    Wears dentures    partial upper and lower   Wears hearing aid in both ears    Past Surgical History:  Procedure Laterality Date   CATARACT EXTRACTION Right 05/22/2011   CATARACT EXTRACTION W/PHACO Left 10/01/2023   Procedure: CATARACT EXTRACTION PHACO AND INTRAOCULAR LENS PLACEMENT (IOC) LEFT 23.44 01:56.2;  Surgeon: Mittie Gaskin, MD;  Location: [facility];  Service: Ophthalmology;  Laterality: Left;   colonoscopy     COLONOSCOPY W/ POLYPECTOMY  08/19/2013   Dr Jeri   COLONOSCOPY WITH PROPOFOL  N/A 08/15/2016   Procedure: COLONOSCOPY WITH PROPOFOL ;  Surgeon: Ruel Kung, MD;  Location: [facility] END[facility]COPY;  Service: Endoscopy;  Laterality: N/A;   CORONARY ANGIOPLASTY WITH STENT PLACEMENT Left 2005   Procedure: CORONARY ANGIOPLASTY WITH STENT PLACEMENT; [facility])   CORONARY ANGIOPLASTY WITH STENT PLACEMENT Left 12/26/2010   Procedure: CORONARY ANGIOPLASTY WITH STENT PLACEMENT; Location: [facility]; Surgeon: Peter Swaziland, MD   ENTEROSCOPY N/A 09/22/2018   Procedure: ENTEROSCOPY;  Surgeon: Unk Corinn Skiff, MD;  Location: [facility] ENDOSCOPY;  Service: Gastroenterology;  Laterality: N/A;   ESOPHAGOGASTRODUODENOSCOPY (EGD) WITH PROPOFOL  N/A 08/15/2016   Procedure: ESOPHAGOGASTRODUODENOSCOPY (EGD) WITH PROPOFOL ;  Surgeon: Ruel Kung, MD;  Location: [facility] ENDOSCOPY;  Service: Endoscopy;  Laterality: N/A;   GIVENS CAPSULE STUDY N/A 09/26/2016   Procedure: GIVENS CAPSULE STUDY;  Surgeon: Ruel Kung, MD;  Location: [facility] ENDOSCOPY;  Service: Endoscopy;  Laterality: N/A;   HERNIA REPAIR  08/20/2011   LEFT HEART CATH AND CORONARY ANGIOGRAPHY N/A 04/29/2022   Procedure: LEFT HEART CATH AND CORONARY ANGIOGRAPHY;  Surgeon: Darron Deatrice LABOR, MD;  Location: [facility] INVASIVE CV LAB;   Service: Cardiovascular;  Laterality: N/A;   LEFT HEART CATH AND CORONARY ANGIOGRAPHY Left 2009   Procedure: LEFT HEART CATH AND CORONARY ANGIOGRAPHY; Location[facility])   LEFT HEART CATH AND CORONARY ANGIOGRAPHY Left 12/24/2010   Procedure: LEFT HEART CATH AND CORONARY ANGIOGRAPHY; Locaiton: [facility]; Surgeon: [name], MD   LEFT HEART CATH AND CORONARY ANGIOGRAPHY Left 10/28/2013   Procedure: LEFT HEART CATH AND CORONARY ANGIOGRAPHY; Location: [facility]; Surgeon: [name], MD   LUMBAR SPINE SURGERY     x2 in [facility] in [phone]   UPPER GI ENDOSCOPY  04/19/2014   Dr Jeri   Family History  Problem Relation Age of Onset   Stroke Mother    Colon cancer Father    Coronary artery disease Brother    Other Brother 1       lymes dz   Social History   Tobacco Use   Smoking status: Never   Smokeless tobacco: Never  Substance Use Topics   Alcohol use: No   LABS:  No visits with results within 30 Day(s) from this visit.  Latest known visit with results is:  Admission on 01/04/2024, Discharged on 01/04/2024  Component Date Value Ref Range Status   Lipase 01/04/2024 30  11 - 51 U/L Final   Performed at [facility] [address] [place], [place] [address]   Sodium 01/04/2024 140  135 - 145 mmol/L Final   Potassium 01/04/2024 4.0  3.5 - 5.1 mmol/L Final   Chloride 01/04/2024 107  98 - 111 mmol/L Final   CO2 01/04/2024 26  22 - 32 mmol/L Final   Glucose, Bld 01/04/2024 108 (H)  70 - 99 mg/dL Final   Glucose reference range applies only to samples taken after fasting for at least 8 hours.   BUN 01/04/2024 24 (H)  8 - 23 mg/dL Final   Creatinine, Ser 01/04/2024 1.30 (H)  0.61 - 1.24 mg/dL Final   Calcium 94/81/7974 9.4  8.9 - 10.3 mg/dL Final   Total Protein 94/81/7974 6.8  6.5 - 8.1 g/dL Final   Albumin 94/81/7974 4.0  3.5 - 5.0 g/dL Final   AST 94/81/7974 24  15 - 41 U/L Final   ALT 01/04/2024 13  0 - 44 U/L Final   Alkaline Phosphatase 01/04/2024 28  (L)  38 - 126 U/L Final   Total Bilirubin 01/04/2024 1.5 (H)  0.0 - 1.2 mg/dL Final   GFR, Estimated 01/04/2024 52 (L)  >60 mL/min Final   Comment: (NOTE) Calculated using the CKD-EPI Creatinine Equation (2021)    Anion gap 01/04/2024 7  5 - 15 Final   Performed at  [facility], [address] [place], [place] [address]   Color, Urine 01/04/2024 YELLOW (A)  YELLOW Final   APPearance 01/04/2024 CLEAR (A)  CLEAR Final   Specific Gravity, Urine 01/04/2024 1.017  1.005 - 1.030 Final   pH 01/04/2024 5.0  5.0 - 8.0 Final   Glucose, UA 01/04/2024 NEGATIVE  NEGATIVE mg/dL Final   Hgb urine dipstick 01/04/2024 NEGATIVE  NEGATIVE Final   Bilirubin Urine 01/04/2024 NEGATIVE  NEGATIVE Final   Ketones, ur 01/04/2024 NEGATIVE  NEGATIVE mg/dL Final   Protein, ur 94/81/7974 NEGATIVE  NEGATIVE mg/dL Final   Nitrite 94/81/7974 NEGATIVE  NEGATIVE Final   Leukocytes,Ua 01/04/2024 NEGATIVE  NEGATIVE Final   Performed at [facility], [address] [place], [place] [address]   Troponin I (High Sensitivity) 01/04/2024 9  <18 ng/L Final   Comment: (NOTE) Elevated high sensitivity troponin I (hsTnI) values and significant  changes across serial measurements may suggest ACS but many other  chronic and acute conditions are known to elevate hsTnI results.  Refer to the Links section for chest pain algorithms and additional  guidance. Performed at [facility], [address] [place], [place] [address]    WBC 01/04/2024 6.3  4.0 - 10.5 K/uL Final   RBC 01/04/2024 2.90 (L)  4.22 - 5.81 MIL/uL Final   Hemoglobin 01/04/2024 9.6 (L)  13.0 - 17.0 g/dL Final   HCT 94/81/7974 28.5 (L)  39.0 - 52.0 % Final   MCV 01/04/2024 98.3  80.0 - 100.0 fL Final   MCH 01/04/2024 33.1  26.0 - 34.0 pg Final   MCHC 01/04/2024 33.7  30.0 - 36.0 g/dL Final   CORRECTED FOR COLD AGGLUTININS   RDW 01/04/2024 14.1  11.5 - 15.5 % Final   Platelets 01/04/2024 220  150 - 400 K/uL Final   nRBC  01/04/2024 0.0  0.0 - 0.2 % Final   Performed at [facility], [address] [place], [place] [address]    ECG: Date: 02/18/2024  Time ECG obtained: 1102 AM Rate: 76 bpm Rhythm: normal sinus Axis (leads I and aVF): normal Intervals: PR 128 ms. QRS 98 ms. QTc 450 ms. ST segment and T wave changes: No evidence of acute T wave abnormalities or significant ST segment elevation or depression.  Evidence of a possible, age undetermined, prior infarct:  No Comparison: Similar to previous tracing obtained on 05/30/2023   IMAGING / PROCEDURES: US  LT LOWER EXTREM LTD SOFT TISSUE NON VASCULAR performed on 01/30/2024 Left inguinal fat containing hernia. This is similar to that seen on prior CT examination.  TRANSTHORACIC ECHOCARDIOGRAM performed on 05/22/2023 Left ventricular ejection fraction, by estimation, is 60 to 65%. The left ventricle has normal function. The left ventricle has no regional wall motion abnormalities. Left ventricular diastolic parameters are  consistent with Grade I diastolic dysfunction (impaired relaxation).  Right ventricular systolic function is normal. The right ventricular size is normal.  The mitral valve is normal in structure. No evidence of mitral valve regurgitation. No evidence of mitral stenosis.  The aortic valve is tricuspid. Aortic valve regurgitation is not visualized. No aortic stenosis is present. Aortic valve mean gradient measures 5.0 mmHg.  There is mild dilatation of the aortic root, measuring 40 mm. There is mild dilatation of the ascending aorta, measuring 41 mm.  The inferior vena cava is normal in size with greater than 50% respiratory variability, suggesting right atrial pressure of 3 mmHg.   CT HEAD WO CONTRAST performed on 02/26/2023 No acute  intracranial abnormality. Soft tissue swelling along the right frontal scalp. No evidence of an underlying calvarial fracture. No acute cervical spine fracture. Acute superior endplate  compression deformity at T2. There is a 1.7 cm left thyroid  nodule. Recommend further evaluation with dedicated thyroid  ultrasound, if not previously performed.  LEFT HEART CATHETERIZATION  performed on 04/29/2022 Widely patent LAD and OM stents with minimal in-stent restenosis Moderately calcified coronary arteries with mild to moderate nonobstructive coronary artery disease Prox RCA lesion is 40% stenosed. Mid RCA to Dist RCA lesion is 30% stenosed 3rd Mrg lesion is 10% stenosed. Dist LAD lesion is 60% stenosed. Mid LAD lesion is 20% stenosed. Mid LAD to Dist LAD lesion is 20% stenosed. Prox LAD to Mid LAD lesion is 20% stenosed. Left ventricular angiography not performed due to CKD. LVEDP mildly elevated Recommendations: continue with medical therapy.    MR CARD MORPHOLOGY WO/W CM performed on 11/13/2020 Normal left ventricular size and systolic function (LVEF = 56%). There are no regional wall motion abnormalities. There is severe asymmetric septal hypertrophy of the basal septum measuring 1.6cm. The LV basal-lateral, mid and apical walls have normal thickness. There is no LVOT obstruction.There is no late gadolinium enhancement in the left ventricular myocardium. LVEDV: 131 ml LVESV: 57 ml SV: 73 ml CO: 5.2 L/min Myocardial mass: 115 g Normal right ventricular size, thickness and systolic function (RVEF = 58%). There are no regional wall motion abnormalities. Normal left and right atrial size. Normal size of the aortic root, ascending aorta and pulmonary artery No significant valvular abnormalities.  Recommendations: Findings consistent with non obstructive HCM. However, given patient's age and lack of high risk factors (no gradient, no scar, wall thickness <54mm), no additional therapy or management is warranted.  Genetic testing (for patient) and screening could be offered to patient's family/children.    NM MYOCAR MULTI W/SPECT W/WALL MOTION / EF performed on  08/17/2020 Normal wall motion EF estimated at 77% No EKG changes concerning for ischemia at peak stress or in recovery. Pharmacological myocardial perfusion imaging study with no significant ischemia  Low risk scan  LONG TERM CARDIAC EVENT MONITOR STUDY performed on 07/21/2019 Average HR of 85 bpm.  Sinus Rhythm.  6 Supraventricular Tachycardia/atrial tachycardia  runs occurred, the run with the fastest interval lasting 7 beats with a max rate of 197 bpm, the longest lasting 18 beats with an avg rate of 126 bpm.  Isolated SVEs were occasional (1.2%, 19914), SVE Couplets were rare (<1.0%, 14), and no SVE Triplets were present.  Isolated VEs were rare (<1.0%), VE Couplets were rare (<1.0%), and no VE Triplets were present. Patient triggered events were not associated with significant arrhythmia.    IMPRESSION AND PLAN: William Taylor has been referred for pre-anesthesia review and clearance prior to him undergoing the planned anesthetic and procedural courses. Available labs, pertinent testing, and imaging results were personally reviewed by me in preparation for upcoming operative/procedural course. [facility] medical record has been updated following extensive record review and patient interview with PAT staff.   This patient has been appropriately cleared by cardiology with an overall ACCEPTABLE risk of patient experiencing significant perioperative cardiovascular complications. Based on clinical review performed today (02/25/24), barring any significant acute changes in the patient's overall condition, it is anticipated that he will be able to proceed with the planned surgical intervention. Any acute changes in clinical condition may necessitate his procedure being postponed and/or cancelled. Patient will meet with anesthesia team (MD and/or CRNA) on the day of  his procedure for preoperative evaluation/assessment. Questions regarding anesthetic course will be fielded at that time.    Pre-surgical instructions were reviewed with the patient during his PAT appointment, and questions were fielded to satisfaction by PAT clinical staff. He has been instructed on which medications that he will need to hold prior to surgery, as well as the ones that have been deemed safe/appropriate to take on the day of his procedure. As part of the general education provided by PAT, patient made aware both verbally and in writing, that he would need to abstain from the use of any illegal substances during his perioperative course. He was advised that failure to follow the provided instructions could necessitate case cancellation or result in serious perioperative complications up to and including death. Patient encouraged to contact PAT and/or his surgeon's office to discuss any questions or concerns that may arise prior to surgery; verbalized understanding.   Dorise Pereyra, MSN, APRN, FNP-C, CEN [facility]  Perioperative Services Nurse Practitioner Phone: [phone] Fax: [phone] 02/25/24 13:05 PM  NOTE: This note has been prepared using Dragon dictation software. Despite my best ability to proofread, there is always the potential that unintentional transcriptional errors may still occur from this process.

## 2024-02-25 NOTE — Progress Notes (Signed)
 Virtual Visit via Telephone Note   Because of RAFAEL SALWAY co-morbid illnesses, he is at least at moderate risk for complications without adequate follow up.  This format is felt to be most appropriate for this patient at this time.  Due to technical limitations with video connection Web designer), today's appointment will be conducted as an audio only telehealth visit, and William Taylor verbally agreed to proceed in this manner.   All issues noted in this document were discussed and addressed.  No physical exam could be performed with this format.  Evaluation Performed:  Preoperative cardiovascular risk assessment _____________   Date:  02/25/2024   Patient ID:  William Taylor, DOB 08-15-1930, MRN [id number] Patient Location:  Home Provider location:   Office  Primary Care Provider:  [name], MD Primary Cardiologist:  [name], MD  Chief Complaint / Patient Profile   89 y.o. y/o male with a h/o CAD s/p DES-LAD in 2005, paroxysmal atrial fibrillation, PE, hypertension, orthostatic hypotension, PVCs, hyperlipidemia, CKD stage IIIa, chronic fatigue who is pending OPEN INGUINAL HERNIA REPAIR on 02/27/2024 with Dr. [facility] and presents today for telephonic preoperative cardiovascular risk assessment.  History of Present Illness    William Taylor is a 89 y.o. male who presents via audio/video conferencing for a telehealth visit today.  Pt was last seen in cardiology clinic on 05/30/2023 by Dr. Gollan.  At that time William Taylor was doing well.  The patient is now pending procedure as outlined above. Since his last visit, he has been stable from a cardiac standpoint.  He has stable chronic dyspnea on exertion, unchanged from prior visits.  He denies chest pain, palpitations, pnd, orthopnea, n, v, dizziness, syncope, edema, weight gain, or early satiety. All other systems reviewed and are otherwise negative except as noted above.    Past Medical History    Past Medical History:  Diagnosis Date   Acquired dilation of ascending aorta and aortic root (HCC)    Aortic atherosclerosis (HCC)    Atypical chest pain    BPH (benign prostatic hyperplasia)    Cerebral microvascular disease    CKD (chronic kidney disease), stage III (HCC)    Colon polyp    Compression fracture of T2 vertebra (HCC) 02/26/2023   Coronary artery disease    a.) PCI LAD (Cypher DES) in 2005; b.)  LHC 2009: no obstructive CAD and patent stent; c.) PCI OM2 12/26/2010 (2.25 x 16 mm Promus Element); d.) LHC 10/28/2013: LAD 64m ISR, 83m, D1 90, D2 70, OM2 80 w/ patent stent;  e.) 06/2019 Low risk MV; f.) 07/2020 MV: EF 77%, no ischemia/infarct.   CVA (cerebral vascular accident) [facility])    a.) age indeterminate infarcts in the RIGHT cerebellum and LEFT parietal lobe   Depression    Dyspnea    Gall stones    GERD (gastroesophageal reflux disease)    Glaucoma    Hyperlipidemia    Hypertension    Hypertrophic nonobstructive cardiomyopathy (with severe asymmetric septal hypertrophy)    IDA (iron deficiency anemia)    Left inguinal hernia    Leg weakness    Long term current use of amiodarone     Melanoma (HCC)    Multinodular thyroid     NSTEMI (non-ST elevated myocardial infarction) (HCC) 10/26/2013   a.) experienced in setting of antithrombotic hold (clopidogrel ) for routine colonoscopy; b.) troponins trended: 1.00 --> 5.10 --> 6.20 ng/mL; c.) LHC 10/31/2013: 10/40% mLAD, 90/70% D1, 80% OM1 --  med mgmt   NSTEMI (non-ST elevated myocardial infarction) (HCC) 12/24/2010   a.) LHC 12/24/2010: 30 % LAD, 30% ISR mLAD, 50% dLAD, 95% OM2 --> d/t distal bifurcation lesion unable to see RCA --> transfer to [facility] --> PCI 12/26/2010 placing a 2.25 x 16 mm Promus Element DES to OM2   On rivaroxaban  therapy    Orthostatic hypotension    a. prev on midodrine /florinef /northera .   PAF (paroxysmal atrial fibrillation) (HCC)    a.) CHA2DS2VASc = 6 (age x2, HTN, CVA/PE  x 2, prior MI/vascular disease) as of 02/24/2024; b.) rate/rhythm maintained on oral amiodarone ; chronically anticoagulated with rivaroxaban    Prostate cancer (HCC)    PSVT (paroxysmal supraventricular tachycardia) (HCC)    Pulmonary embolism (HCC) 2006   Skin cancer    Wears dentures    partial upper and lower   Wears hearing aid in both ears    Past Surgical History:  Procedure Laterality Date   CARDIAC CATHETERIZATION  10/17/2013   [facility]   CATARACT EXTRACTION  05/22/2011   right eye   CATARACT EXTRACTION W/PHACO Left 10/01/2023   Procedure: CATARACT EXTRACTION PHACO AND INTRAOCULAR LENS PLACEMENT (IOC) LEFT 23.44 01:56.2;  Surgeon: Mittie Gaskin, MD;  Location: [facility];  Service: Ophthalmology;  Laterality: Left;   colonoscopy     COLONOSCOPY W/ POLYPECTOMY  08/19/2013   Dr Jeri   COLONOSCOPY WITH PROPOFOL  N/A 08/15/2016   Procedure: COLONOSCOPY WITH PROPOFOL ;  Surgeon: Ruel Kung, MD;  Location: [facility] ENDOSCOPY;  Service: Endoscopy;  Laterality: N/A;   CORONARY ANGIOPLASTY  08/20/2007   2005; s/p stent   ENTEROSCOPY N/A 09/22/2018   Procedure: ENTEROSCOPY;  Surgeon: Unk Corinn Skiff, MD;  Location: [facility] ENDOSCOPY;  Service: Gastroenterology;  Laterality: N/A;   ESOPHAGOGASTRODUODENOSCOPY (EGD) WITH PROPOFOL  N/A 08/15/2016   Procedure: ESOPHAGOGASTRODUODENOSCOPY (EGD) WITH PROPOFOL ;  Surgeon: Ruel Kung, MD;  Location: [facility] ENDOSCOPY;  Service: Endoscopy;  Laterality: N/A;   GIVENS CAPSULE STUDY N/A 09/26/2016   Procedure: GIVENS CAPSULE STUDY;  Surgeon: Ruel Kung, MD;  Location: [facility] ENDOSCOPY;  Service: Endoscopy;  Laterality: N/A;   HERNIA REPAIR  08/20/2011   LEFT HEART CATH AND CORONARY ANGIOGRAPHY N/A 04/29/2022   Procedure: LEFT HEART CATH AND CORONARY ANGIOGRAPHY;  Surgeon: Darron Deatrice LABOR, MD;  Location: [facility] INVASIVE CV LAB;  Service: Cardiovascular;  Laterality: N/A;   LUMBAR SPINE SURGERY     x2 in [facility] in 1990's   UPPER GI ENDOSCOPY  04/19/2014    Dr Jeri    Allergies  Allergies  Allergen Reactions   Bee Venom Swelling   Sulfasalazine Hives and Swelling   Sulfa Antibiotics Hives and Swelling   Warfarin     Chest pain   Warfarin And Related Other (See Comments)    Chest pain    Home Medications    Prior to Admission medications   Medication Sig Start Date End Date Taking? Authorizing Provider  acetaminophen  (TYLENOL ) 500 MG tablet Take 500 mg by mouth every 6 (six) hours as needed for moderate pain (pain score 4-6).    [provider]  amiodarone  (PACERONE ) 100 MG tablet Take 1 tablet (100 mg total) by mouth daily. 12/01/23   Gollan, Timothy J, MD  cholecalciferol (VITAMIN D3) 25 MCG (1000 UNIT) tablet Take 1,000 Units by mouth once a week.    [provider]  fenofibrate  micronized (ANTARA ) 130 MG capsule TAKE 1 CAPSULE BY MOUTH DAILY  BEFORE BREAKFAST 03/06/23   Maribeth Camellia MATSU, MD  finasteride  (PROSCAR ) 5 MG tablet TAKE  1 TABLET BY MOUTH DAILY 06/05/23   Stoioff, Glendia BROCKS, MD  levobunolol  (BETAGAN ) 0.5 % ophthalmic solution Place 1 drop into the right eye 2 (two) times daily.    [provider]  lisinopril  (ZESTRIL ) 5 MG tablet Take 1 tablet (5 mg total) by mouth daily. 07/27/23   [name], MD  loratadine  (CLARITIN ) 10 MG tablet Take 1 tablet (10 mg total) by mouth daily. 08/30/20   Maribeth Camellia MATSU, MD  Multiple Vitamin (MULTIVITAMIN PO) Take 1 tablet by mouth daily.    [provider]  nitroGLYCERIN  (NITROSTAT ) 0.4 MG SL tablet DISSOLVE UNDER THE TONGUE 1 TABLET EVERY 5 MINUTES AS NEEDED FOR CHEST PAIN 04/30/23   Maribeth Camellia MATSU, MD  pantoprazole  (PROTONIX ) 40 MG tablet TAKE ONE (1) TABLET BY MOUTH TWO TIMES PER DAY 06/12/23   Therisa Bi, MD  pravastatin  (PRAVACHOL ) 40 MG tablet Take 1 tablet (40 mg total) by mouth at bedtime. 11/18/23   [name], MD  XARELTO  15 MG TABS tablet TAKE 1 TABLET BY MOUTH DAILY 04/07/23   Gollan, Timothy J, MD    Physical Exam    Vital  Signs:  RODD HEFT does not have vital signs available for review today.  Given telephonic nature of communication, physical exam is limited. AAOx3. NAD. Normal affect.  Speech and respirations are unlabored.  Accessory Clinical Findings    None  Assessment & Plan    1.  Preoperative Cardiovascular Risk Assessment:  According to the Revised Cardiac Risk Index (RCRI), his Perioperative Risk of Major Cardiac Event is (%): 0.9. His Functional Capacity in METs is: 4.06 according to the [facility] Activity Status Index (DASI).Therefore, based on ACC/AHA guidelines, patient would be at acceptable risk for the planned procedure without further cardiovascular testing.  The patient was advised that if he develops new symptoms prior to surgery to contact our office to arrange for a follow-up visit, and he verbalized understanding.  Per office protocol, patient can hold Xarelto  for 2 days prior to procedure.  Please resume Xarelto  as soon as possible postprocedure, at the discretion of the surgeon.    A copy of this note will be routed to requesting surgeon.  Time:   Today, I have spent 7 minutes with the patient with telehealth technology discussing medical history, symptoms, and management plan.     Damien BROCKS Braver, NP  02/25/2024, 12:47 PM

## 2024-02-26 ENCOUNTER — Encounter: Payer: Self-pay | Admitting: General Surgery

## 2024-02-27 ENCOUNTER — Encounter: Payer: Self-pay | Admitting: General Surgery

## 2024-02-27 ENCOUNTER — Ambulatory Visit
Admission: RE | Admit: 2024-02-27 | Discharge: 2024-02-27 | Disposition: A | Discharge status: Home / Self Care | Source: Ambulatory Visit | Attending: General Surgery | Admitting: General Surgery

## 2024-02-27 ENCOUNTER — Ambulatory Visit: Payer: Self-pay | Admitting: Urgent Care

## 2024-02-27 ENCOUNTER — Encounter
Admission: RE | Disposition: A | Discharge status: Home / Self Care | Payer: Self-pay | Source: Ambulatory Visit | Attending: General Surgery

## 2024-02-27 ENCOUNTER — Other Ambulatory Visit: Payer: Self-pay

## 2024-02-27 DIAGNOSIS — G709 Myoneural disorder, unspecified: Secondary | ICD-10-CM | POA: Diagnosis not present

## 2024-02-27 DIAGNOSIS — I422 Other hypertrophic cardiomyopathy: Secondary | ICD-10-CM | POA: Insufficient documentation

## 2024-02-27 DIAGNOSIS — I129 Hypertensive chronic kidney disease with stage 1 through stage 4 chronic kidney disease, or unspecified chronic kidney disease: Secondary | ICD-10-CM | POA: Diagnosis not present

## 2024-02-27 DIAGNOSIS — I4719 Other supraventricular tachycardia: Secondary | ICD-10-CM | POA: Diagnosis not present

## 2024-02-27 DIAGNOSIS — Z8546 Personal history of malignant neoplasm of prostate: Secondary | ICD-10-CM | POA: Diagnosis not present

## 2024-02-27 DIAGNOSIS — N183 Chronic kidney disease, stage 3 unspecified: Secondary | ICD-10-CM | POA: Diagnosis not present

## 2024-02-27 DIAGNOSIS — I252 Old myocardial infarction: Secondary | ICD-10-CM | POA: Insufficient documentation

## 2024-02-27 DIAGNOSIS — R1032 Left lower quadrant pain: Secondary | ICD-10-CM

## 2024-02-27 DIAGNOSIS — Z8673 Personal history of transient ischemic attack (TIA), and cerebral infarction without residual deficits: Secondary | ICD-10-CM | POA: Diagnosis not present

## 2024-02-27 DIAGNOSIS — K4091 Unilateral inguinal hernia, without obstruction or gangrene, recurrent: Secondary | ICD-10-CM | POA: Diagnosis present

## 2024-02-27 DIAGNOSIS — I700 Atherosclerosis of aorta: Secondary | ICD-10-CM | POA: Insufficient documentation

## 2024-02-27 DIAGNOSIS — K219 Gastro-esophageal reflux disease without esophagitis: Secondary | ICD-10-CM | POA: Diagnosis not present

## 2024-02-27 DIAGNOSIS — Z955 Presence of coronary angioplasty implant and graft: Secondary | ICD-10-CM | POA: Insufficient documentation

## 2024-02-27 DIAGNOSIS — I2510 Atherosclerotic heart disease of native coronary artery without angina pectoris: Secondary | ICD-10-CM | POA: Diagnosis not present

## 2024-02-27 DIAGNOSIS — I480 Paroxysmal atrial fibrillation: Secondary | ICD-10-CM | POA: Diagnosis not present

## 2024-02-27 HISTORY — DX: Cardiomegaly: I51.7

## 2024-02-27 HISTORY — DX: Long term (current) use of anticoagulants: Z79.01

## 2024-02-27 HISTORY — DX: Iron deficiency anemia, unspecified: D50.9

## 2024-02-27 HISTORY — DX: Unilateral inguinal hernia, without obstruction or gangrene, not specified as recurrent: K40.90

## 2024-02-27 HISTORY — DX: Nontoxic multinodular goiter: E04.2

## 2024-02-27 HISTORY — DX: Malignant neoplasm of prostate: C61

## 2024-02-27 HISTORY — DX: Benign prostatic hyperplasia without lower urinary tract symptoms: N40.0

## 2024-02-27 HISTORY — DX: Thoracic aortic ectasia: I77.810

## 2024-02-27 HISTORY — DX: Other cerebrovascular disease: I67.89

## 2024-02-27 HISTORY — DX: Other hypertrophic cardiomyopathy: I42.2

## 2024-02-27 HISTORY — DX: Other chest pain: R07.89

## 2024-02-27 HISTORY — DX: Depression, unspecified: F32.A

## 2024-02-27 HISTORY — DX: Supraventricular tachycardia, unspecified: I47.10

## 2024-02-27 HISTORY — DX: Unspecified glaucoma: H40.9

## 2024-02-27 HISTORY — DX: Other long term (current) drug therapy: Z79.899

## 2024-02-27 HISTORY — DX: Cerebral infarction, unspecified: I63.9

## 2024-02-27 HISTORY — DX: Atherosclerosis of aorta: I70.0

## 2024-02-27 HISTORY — PX: INGUINAL HERNIA REPAIR: SHX194

## 2024-02-27 SURGERY — REPAIR, HERNIA, INGUINAL, ADULT
Anesthesia: General | Laterality: Left

## 2024-02-27 MED ORDER — SUGAMMADEX SODIUM 200 MG/2ML IV SOLN
INTRAVENOUS | Status: DC | PRN
  Administered 2024-02-27: 168.8 mg via INTRAVENOUS

## 2024-02-27 MED ORDER — RIVAROXABAN 15 MG PO TABS: 15.0000 mg | ORAL_TABLET | Freq: Every day | ORAL | Status: DC

## 2024-02-27 MED ORDER — SODIUM CHLORIDE 0.9 % IV SOLN: INTRAVENOUS | Status: DC

## 2024-02-27 MED ORDER — CHLORHEXIDINE GLUCONATE 0.12 % MT SOLN
OROMUCOSAL | Status: AC
  Filled 2024-02-27: qty 15

## 2024-02-27 MED ORDER — FENTANYL CITRATE (PF) 100 MCG/2ML IJ SOLN
25.0000 ug | INTRAMUSCULAR | Status: DC | PRN
  Administered 2024-02-27 (×2): 25 ug via INTRAVENOUS

## 2024-02-27 MED ORDER — ORAL CARE MOUTH RINSE: 15.0000 mL | Freq: Once | OROMUCOSAL | Status: AC

## 2024-02-27 MED ORDER — CHLORHEXIDINE GLUCONATE 0.12 % MT SOLN
15.0000 mL | Freq: Once | OROMUCOSAL | Status: AC
  Administered 2024-02-27: 15 mL via OROMUCOSAL

## 2024-02-27 MED ORDER — ACETAMINOPHEN 10 MG/ML IV SOLN
INTRAVENOUS | Status: DC | PRN
  Administered 2024-02-27: 1000 mg via INTRAVENOUS

## 2024-02-27 MED ORDER — ACETAMINOPHEN 10 MG/ML IV SOLN
INTRAVENOUS | Status: AC
Start: 2024-02-27 — End: 2024-02-27
  Filled 2024-02-27: qty 100

## 2024-02-27 MED ORDER — OXYCODONE HCL 5 MG/5ML PO SOLN: 5.0000 mg | Freq: Once | ORAL | Status: AC | PRN

## 2024-02-27 MED ORDER — FENTANYL CITRATE (PF) 100 MCG/2ML IJ SOLN
INTRAMUSCULAR | Status: AC
Start: 2024-02-27 — End: 2024-02-27
  Filled 2024-02-27: qty 2

## 2024-02-27 MED ORDER — PHENYLEPHRINE HCL-NACL 20-0.9 MG/250ML-% IV SOLN
INTRAVENOUS | Status: AC
  Filled 2024-02-27: qty 250

## 2024-02-27 MED ORDER — ONDANSETRON HCL 4 MG/2ML IJ SOLN
INTRAMUSCULAR | Status: AC
  Filled 2024-02-27: qty 2

## 2024-02-27 MED ORDER — BUPIVACAINE LIPOSOME 1.3 % IJ SUSP
INTRAMUSCULAR | Status: DC | PRN
  Administered 2024-02-27: 10 mL

## 2024-02-27 MED ORDER — BUPIVACAINE-EPINEPHRINE (PF) 0.5% -1:200000 IJ SOLN
INTRAMUSCULAR | Status: AC
  Filled 2024-02-27: qty 10

## 2024-02-27 MED ORDER — PROPOFOL 10 MG/ML IV BOLUS
INTRAVENOUS | Status: DC | PRN
  Administered 2024-02-27: 100 mg via INTRAVENOUS

## 2024-02-27 MED ORDER — DEXAMETHASONE SODIUM PHOSPHATE 10 MG/ML IJ SOLN
INTRAMUSCULAR | Status: AC
  Filled 2024-02-27: qty 1

## 2024-02-27 MED ORDER — FENTANYL CITRATE (PF) 100 MCG/2ML IJ SOLN
INTRAMUSCULAR | Status: DC | PRN
  Administered 2024-02-27: 25 ug via INTRAVENOUS
  Administered 2024-02-27: 50 ug via INTRAVENOUS
  Administered 2024-02-27: 25 ug via INTRAVENOUS

## 2024-02-27 MED ORDER — DEXAMETHASONE SODIUM PHOSPHATE 10 MG/ML IJ SOLN
INTRAMUSCULAR | Status: DC | PRN
  Administered 2024-02-27: 5 mg via INTRAVENOUS

## 2024-02-27 MED ORDER — 0.9 % SODIUM CHLORIDE (POUR BTL) OPTIME
TOPICAL | Status: DC | PRN
  Administered 2024-02-27: 500 mL

## 2024-02-27 MED ORDER — FENTANYL CITRATE (PF) 100 MCG/2ML IJ SOLN
INTRAMUSCULAR | Status: AC
  Filled 2024-02-27: qty 2

## 2024-02-27 MED ORDER — BUPIVACAINE-EPINEPHRINE (PF) 0.5% -1:200000 IJ SOLN
INTRAMUSCULAR | Status: DC | PRN
  Administered 2024-02-27: 10 mL

## 2024-02-27 MED ORDER — CHLORHEXIDINE GLUCONATE CLOTH 2 % EX PADS: 6.0000 | MEDICATED_PAD | Freq: Once | CUTANEOUS | Status: DC

## 2024-02-27 MED ORDER — OXYCODONE HCL 5 MG PO TABS
5.0000 mg | ORAL_TABLET | Freq: Once | ORAL | Status: AC | PRN
  Administered 2024-02-27: 5 mg via ORAL

## 2024-02-27 MED ORDER — ROCURONIUM BROMIDE 100 MG/10ML IV SOLN
INTRAVENOUS | Status: DC | PRN
  Administered 2024-02-27: 10 mg via INTRAVENOUS
  Administered 2024-02-27: 50 mg via INTRAVENOUS
  Administered 2024-02-27: 5 mg via INTRAVENOUS
  Administered 2024-02-27: 10 mg via INTRAVENOUS

## 2024-02-27 MED ORDER — CEFAZOLIN SODIUM-DEXTROSE 2-4 GM/100ML-% IV SOLN
2.0000 g | INTRAVENOUS | Status: AC
  Administered 2024-02-27: 2 g via INTRAVENOUS

## 2024-02-27 MED ORDER — BUPIVACAINE LIPOSOME 1.3 % IJ SUSP
INTRAMUSCULAR | Status: AC
  Filled 2024-02-27: qty 10

## 2024-02-27 MED ORDER — PHENYLEPHRINE HCL-NACL 20-0.9 MG/250ML-% IV SOLN
INTRAVENOUS | Status: DC | PRN
  Administered 2024-02-27: 25 ug/min via INTRAVENOUS

## 2024-02-27 MED ORDER — OXYCODONE HCL 5 MG PO TABS: 5.0000 mg | ORAL_TABLET | Freq: Four times a day (QID) | ORAL | 0 refills | Status: DC | PRN

## 2024-02-27 MED ORDER — CEFAZOLIN SODIUM-DEXTROSE 2-4 GM/100ML-% IV SOLN
INTRAVENOUS | Status: AC
  Filled 2024-02-27: qty 100

## 2024-02-27 MED ORDER — OXYCODONE HCL 5 MG PO TABS
ORAL_TABLET | ORAL | Status: AC
Start: 2024-02-27 — End: 2024-02-27
  Filled 2024-02-27: qty 1

## 2024-02-27 MED ORDER — LIDOCAINE HCL (PF) 2 % IJ SOLN
INTRAMUSCULAR | Status: AC
  Filled 2024-02-27: qty 5

## 2024-02-27 MED ORDER — ONDANSETRON HCL 4 MG/2ML IJ SOLN
INTRAMUSCULAR | Status: DC | PRN
  Administered 2024-02-27: 4 mg via INTRAVENOUS

## 2024-02-27 MED ORDER — ESMOLOL HCL 100 MG/10ML IV SOLN
INTRAVENOUS | Status: AC
  Filled 2024-02-27: qty 10

## 2024-02-27 MED ORDER — ROCURONIUM BROMIDE 10 MG/ML (PF) SYRINGE
PREFILLED_SYRINGE | INTRAVENOUS | Status: AC
  Filled 2024-02-27: qty 10

## 2024-02-27 MED ORDER — LIDOCAINE HCL (CARDIAC) PF 100 MG/5ML IV SOSY
PREFILLED_SYRINGE | INTRAVENOUS | Status: DC | PRN
  Administered 2024-02-27: 60 mg via INTRAVENOUS

## 2024-02-27 SURGICAL SUPPLY — 27 items
BLADE CLIPPER SURG (BLADE) ×1 IMPLANT
BLADE SURG 15 STRL LF DISP TIS (BLADE) ×1 IMPLANT
BRUSH SCRUB EZ 4% CHG (MISCELLANEOUS) ×1 IMPLANT
CHLORAPREP W/TINT 26 (MISCELLANEOUS) IMPLANT
DERMABOND ADVANCED .7 DNX12 (GAUZE/BANDAGES/DRESSINGS) ×1 IMPLANT
DRAIN PENROSE 12X.25 LTX STRL (MISCELLANEOUS) ×1 IMPLANT
DRAPE LAPAROTOMY TRNSV 106X77 (MISCELLANEOUS) ×1 IMPLANT
ELECTRODE REM PT RTRN 9FT ADLT (ELECTROSURGICAL) ×1 IMPLANT
GAUZE 4X4 16PLY ~~LOC~~+RFID DBL (SPONGE) IMPLANT
GLOVE BIOGEL PI IND STRL 7.5 (GLOVE) ×1 IMPLANT
GLOVE SURG SYN 7.0 PF PI (GLOVE) ×1 IMPLANT
GOWN STRL REUS W/ TWL LRG LVL3 (GOWN DISPOSABLE) ×2 IMPLANT
LABEL OR SOLS (LABEL) ×1 IMPLANT
MANIFOLD NEPTUNE II (INSTRUMENTS) ×1 IMPLANT
MESH SYNTHETIC 1.8X4 KEYHOLE S (Mesh General) IMPLANT
NDL HYPO 22X1.5 SAFETY MO (MISCELLANEOUS) ×1 IMPLANT
NEEDLE HYPO 22X1.5 SAFETY MO (MISCELLANEOUS) ×1 IMPLANT
NS IRRIG 500ML POUR BTL (IV SOLUTION) ×1 IMPLANT
PACK BASIN MINOR ARMC (MISCELLANEOUS) ×1 IMPLANT
SUT PROLENE 2 0 SH DA (SUTURE) ×3 IMPLANT
SUT SILK 3-0 18XBRD TIE 12 (SUTURE) ×1 IMPLANT
SUT VIC AB 2-0 SH 27XBRD (SUTURE) ×1 IMPLANT
SUT VIC AB 3-0 SH 27X BRD (SUTURE) ×2 IMPLANT
SUTURE MNCRL 4-0 27XMF (SUTURE) ×1 IMPLANT
SYR 10ML LL (SYRINGE) ×1 IMPLANT
TRAP FLUID SMOKE EVACUATOR (MISCELLANEOUS) ×1 IMPLANT
WATER STERILE IRR 500ML POUR (IV SOLUTION) ×1 IMPLANT

## 2024-02-27 NOTE — Transfer of Care (Signed)
 Immediate Anesthesia Transfer of Care Note  Patient: William Taylor  Procedure(s) Performed: REPAIR, HERNIA, INGUINAL, ADULT, RECURRENT (Left)  Patient Location: PACU  Anesthesia Type:General  Level of Consciousness: awake and alert   Airway & Oxygen Therapy: Patient Spontanous Breathing and Patient connected to face mask oxygen  Post-op Assessment: Report given to RN and Post -op Vital signs reviewed and stable  Post vital signs: Reviewed and stable  Last Vitals:  Vitals Value Taken Time  BP 178/93 02/27/24 14:00  Temp 36.2 C 02/27/24 13:56  Pulse 62 02/27/24 14:02  Resp 18 02/27/24 14:02  SpO2 100 % 02/27/24 14:02  Vitals shown include unfiled device data.  Last Pain:  Vitals:   02/27/24 1356  TempSrc:   PainSc: 0-No pain         Complications: No notable events documented.

## 2024-02-27 NOTE — H&P (Signed)
 No changes to below H&P.  The patient has undergone cardiac restratification and has held his anticoagulation since the eighth.  Proceed as planned with open left inguinal hernia repair  HPI: The patient returns today to discuss his left inguinal hernia.  He reports that he continues to have pain that starts around his umbilicus and radiates down to his groin.  He says that this is intermittent and random.  He denies that he has had any worry some hernia signs including no obstipation, overlying skin changes, nausea or vomiting.  He did undergo an ultrasound which I independently reviewed that showed a fat-containing right inguinal hernia.       Past Medical History:  Diagnosis Date   Cancer [facility])      Prostate, followed by Dr. Ike   CKD (chronic kidney disease), stage III [facility])     Colon polyp     Coronary artery disease      a. 2005 s/p PCI LAD ([facility]); b 2012 s/p PCI OM2; c.10/2013 Cath: LAD 93m ISR, 67m, D1 90, D2 70, OM2 80 w/ patent stent;  d. 06/2019 Low risk MV; e. 07/2020 MV: EF 77%, no ischemia/infarct.   Gall stones     GERD (gastroesophageal reflux disease)     Grade I diastolic dysfunction     History of heart artery stent     Hyperlipidemia     Hypertension     Melanoma (HCC)     MI (myocardial infarction) (HCC)      2015   Orthostatic hypotension      a. prev on midodrine /florinef /northera .   PAF (paroxysmal atrial fibrillation) (HCC)      a. on xarelto  and amio (CHA2DS2VASc = 4).   Pulmonary embolism (HCC)      2006   Skin cancer     Wears dentures      partial upper and lower   Wears hearing aid in both ears                 Past Surgical History:  Procedure Laterality Date   CARDIAC CATHETERIZATION   10/17/2013    [facility]   CATARACT EXTRACTION   05/22/2011    right eye   CATARACT EXTRACTION W/PHACO Left 10/01/2023    Procedure: CATARACT EXTRACTION PHACO AND INTRAOCULAR LENS PLACEMENT (IOC) LEFT 23.44 01:56.2;  Surgeon: Mittie Gaskin, MD;  Location:  [facility]ERY [facility];  Service: Ophthalmology;  Laterality: Left;   colonoscopy       COLONOSCOPY W/ POLYPECTOMY   08/19/2013    Dr Jeri   COLONOSCOPY WITH PROPOFOL  N/A 08/15/2016    Procedure: COLONOSCOPY WITH PROPOFOL ;  Surgeon: Ruel Kung, MD;  Location: [facility] ENDOSCOPY;  Service: Endoscopy;  Laterality: N/A;   CORONARY ANGIOPLASTY   08/20/2007    2005; s/p stent   ENTEROSCOPY N/A 09/22/2018    Procedure: ENTEROSCOPY;  Surgeon: Unk Corinn Skiff, MD;  Location: [facility] ENDOSCOPY;  Service: Gastroenterology;  Laterality: N/A;   ESOPHAGOGASTRODUODENOSCOPY (EGD) WITH PROPOFOL  N/A 08/15/2016    Procedure: ESOPHAGOGASTRODUODENOSCOPY (EGD) WITH PROPOFOL ;  Surgeon: Ruel Kung, MD;  Location: [facility] ENDOSCOPY;  Service: Endoscopy;  Laterality: N/A;   GIVENS CAPSULE STUDY N/A 09/26/2016    Procedure: GIVENS CAPSULE STUDY;  Surgeon: Ruel Kung, MD;  Location: [facility] ENDOSCOPY;  Service: Endoscopy;  Laterality: N/A;   HERNIA REPAIR   08/20/2011   LEFT HEART CATH AND CORONARY ANGIOGRAPHY N/A 04/29/2022    Procedure: LEFT HEART CATH AND CORONARY ANGIOGRAPHY;  Surgeon: Darron Deatrice LABOR, MD;  Location: [facility]  INVASIVE CV LAB;  Service: Cardiovascular;  Laterality: N/A;   LUMBAR SPINE SURGERY        x2 in [facility] in 1990's   UPPER GI ENDOSCOPY   04/19/2014    Dr Jeri               Family History  Problem Relation Age of Onset   Stroke Mother     Colon cancer Father     Coronary artery disease Brother     Other Brother 82        lymes dz          Social History:  reports that he has never smoked. He has never used smokeless tobacco. He reports that he does not drink alcohol and does not use drugs.   Allergies:  Allergies       Allergies  Allergen Reactions   Bee Venom Swelling   Sulfasalazine Hives and Swelling   Sulfa Antibiotics Hives and Swelling   Warfarin        Chest pain   Warfarin And Related Other (See Comments)      Chest pain        Medications reviewed.       ROS Full ROS  performed and is otherwise negative other than what is stated in HPI     BP (!) 154/76   Pulse 86   Temp (!) 97.5 F (36.4 C) (Oral)   Ht 6' 4 (1.93 m)   Wt 186 lb (84.4 kg)   SpO2 97%   BMI 22.64 kg/m    Physical Exam Patient is alert and oriented.  He is walking with the assistance of a cane, moving all extremities spontaneously, abdomen is soft, nontender and nondistended, left groin is difficult to feel a bulge but he has a defect consistent with inguinal hernia, there is a very well-healed surgical scar that is barely visible in the left groin.       Independently reviewed his ultrasound that shows a left fat-containing inguinal hernia. Assessment/Plan:   Patient with fat-containing left inguinal hernia.  I discussed with him that it is his choice whether he wants a repair.  We talked about the options if we do not have repaired and that the hernia will not go away.  I discussed risk, benefits alternatives of repairing the hernia including risk of infection, bleeding which she is at increased risk for given his anticoagulation, pulmonary and cardiac problems, damage to the vas deferens, testicular ischemia, chronic pain and recurrence.  He I also discussed with him that given his age and medical comorbidities I would not entertain a minimally invasive repair but would do this open.  After a lengthy discussion about the options he is agreed to undergo surgery.  We will tentatively place him on the OR schedule for 11 July.  We will have him undergo cardiac risk stratification.  He will need to hold his anticoagulation 3 days prior to his procedure.     Jayson Endow, M.D. [facility]

## 2024-02-27 NOTE — Op Note (Signed)
 Operative Note  Operative Date: 02/27/2024 Preoperative diagnosis: Left inguinal hernia Postoperative diagnosis: Left inguinal hernia Surgeon: Jayson Endow, MD EBL: 15 cc Procedure open repair of left recurrent inguinal hernia with mesh   After informed consent was obtained the patient was brought to the operating room placed supine on the operating room table.  General endotracheal anesthesia was induce and his left groin was then prepped and draped in usual sterile fashion.  A surgical timeout was called identifying correct patient, site, side and procedure.  A standard groin incision was made and taken down through the subcutaneous tissue.  I did note that the subcutaneous tissue was quite indurated and the planes were fused.  I question at that time whether there was evidence of repair of a left inguinal hernia in the past.  However, the patient had denied any previous inguinal surgeries and there was not an obvious scar.  I was able to dissect down to the external oblique fascia.  Again throughout the case there was fused tissue planes making dissection more difficult.  The external bleak was able to be cleared off and an incision was made along the length of its fibers.  The cord structures were then encircled by dissecting it off of the inguinal canal.  It was encircled with a Penrose drain.  Once I was able to clear out the shelving edge I did see that there was evidence of previous repair as there were sutures to the shelving edge.  It did not look like there was still a mesh in this area.  A hernia sac was dissected off of the cord and able to be dissected back to the deep ring and placed back into the abdomen.  There are also close there was a component of recurrence of a direct hernia some of the floor had been blown out from his previous repair.  A polypropylene mesh was then brought to the field.  It was secured to the pubic tubercle using 2-0 Prolene in running along the shelving edge with  2-0 Prolene.  I was able to clear out the conjoined tendon and the superior edge of the mesh was secured to the conjoined tendon with a 2-0 Prolene in an interrupted fashion.  The tails of the mesh were wrapped around the cord structures and loosely secured together with 2-0 Prolene.  The tails were tucked under the external oblique fascia.  The inguinal canal was irrigated with warm saline and hemostasis was obtained.  The external oblique fibers were then reapproximated with a 3-0 Vicryl.  The deep dermal layer was reapproximated with 3-0 Vicryl and the skin was closed with 4-0 Monocryl.  It was dressed with glue.  Prior to termination of the procedure all sponge and instrument counts were correct x 2.  The patient was awoken from general endotracheal anesthesia and transferred to the PACU in good condition.

## 2024-02-27 NOTE — Anesthesia Procedure Notes (Addendum)
 Procedure Name: Intubation Date/Time: 02/27/2024 11:50 AM  Performed by: Duwayne Craven, CRNAPre-anesthesia Checklist: Patient identified, Patient being monitored, Timeout performed, Emergency Drugs available and Suction available Patient Re-evaluated:Patient Re-evaluated prior to induction Oxygen Delivery Method: Circle system utilized Preoxygenation: Pre-oxygenation with 100% oxygen Induction Type: IV induction Ventilation: Mask ventilation without difficulty Laryngoscope Size: Mac, 4 and McGrath Grade View: Grade I Tube type: Oral Tube size: 7.0 mm Number of attempts: 1 Airway Equipment and Method: Stylet Placement Confirmation: ETT inserted through vocal cords under direct vision, positive ETCO2 and breath sounds checked- equal and bilateral Secured at: 23 cm Tube secured with: Tape Dental Injury: Teeth and Oropharynx as per pre-operative assessment

## 2024-02-27 NOTE — Anesthesia Postprocedure Evaluation (Signed)
 Anesthesia Post Note  Patient: VED MARTOS  Procedure(s) Performed: REPAIR, HERNIA, INGUINAL, ADULT, RECURRENT (Left)  Patient location during evaluation: PACU Anesthesia Type: General Level of consciousness: awake and alert Pain management: pain level controlled Vital Signs Assessment: post-procedure vital signs reviewed and stable Respiratory status: spontaneous breathing, nonlabored ventilation and respiratory function stable Cardiovascular status: blood pressure returned to baseline and stable Postop Assessment: no apparent nausea or vomiting Anesthetic complications: no   No notable events documented.   Last Vitals:  Vitals:   02/27/24 1445 02/27/24 1500  BP: (!) 169/86 (!) 168/85  Pulse: 62 62  Resp: 12 13  Temp:  (!) 36.2 C  SpO2: 94% 94%    Last Pain:  Vitals:   02/27/24 1436  TempSrc:   PainSc: 7                  Fairy POUR Narayan Scull

## 2024-02-27 NOTE — Anesthesia Preprocedure Evaluation (Signed)
 Anesthesia Evaluation  Patient identified by MRN, date of birth, ID band Patient awake    Reviewed: Allergy & Precautions, NPO status , Patient's Chart, lab work & pertinent test results  History of Anesthesia Complications Negative for: history of anesthetic complications  Airway Mallampati: III  TM Distance: <3 FB Neck ROM: full    Dental  (+) Chipped, Poor Dentition, Missing   Pulmonary neg pulmonary ROS, neg shortness of breath   Pulmonary exam normal        Cardiovascular Exercise Tolerance: Good hypertension, + CAD, + Past MI and + Cardiac Stents  Normal cardiovascular exam     Neuro/Psych  Headaches  Neuromuscular disease CVA  negative psych ROS   GI/Hepatic Neg liver ROS,GERD  Controlled,,  Endo/Other  negative endocrine ROS    Renal/GU Renal disease     Musculoskeletal   Abdominal   Peds  Hematology negative hematology ROS (+)   Anesthesia Other Findings Patient has cardiac clearance for this procedure.   Past Medical History: No date: Acquired dilation of ascending aorta and aortic root (HCC) No date: Aortic atherosclerosis (HCC) No date: Atypical chest pain No date: BPH (benign prostatic hyperplasia) No date: Cerebral microvascular disease No date: CKD (chronic kidney disease), stage III (HCC) No date: Colon polyp 02/26/2023: Compression fracture of T2 vertebra (HCC) No date: Coronary artery disease     Comment:  a.) PCI LAD (Cypher DES) in 2005; b.)  LHC 2009: no               obstructive CAD and patent stent; c.) PCI OM2 12/26/2010               (2.25 x 16 mm Promus Element); d.) LHC 10/28/2013: LAD               58m ISR, 53m, D1 90, D2 70, OM2 80 w/ patent stent;  e.)               06/2019 Low risk MV; f.) 07/2020 MV: EF 77%, no               ischemia/infarct. 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) No date: IDA (iron deficiency anemia) No date: Left inguinal hernia No date: Leg weakness No date: Long term current use of amiodarone  No date: Melanoma (HCC) No date: Multinodular thyroid  10/26/2013: NSTEMI (non-ST elevated myocardial infarction) (HCC)     Comment:  a.) experienced in setting of antithrombotic hold               (clopidogrel ) for routine colonoscopy; b.) troponins               trended: 1.00 --> 5.10 --> 6.20 ng/mL; c.) LHC               10/31/2013: 10/40% mLAD, 90/70% D1, 80% OM1 -- med mgmt 12/24/2010: NSTEMI (non-ST elevated myocardial infarction) (HCC)     Comment:  a.) LHC 12/24/2010: 30 % LAD, 30% ISR mLAD, 50% dLAD,               95% OM2 --> d/t distal  bifurcation lesion unable to see               RCA --> transfer to [facility] --> PCI 12/26/2010 placing a               2.25 x 16 mm Promus Element DES to OM2 No date: On rivaroxaban  therapy No date: Orthostatic hypotension     Comment:  a. prev on midodrine /florinef /northera . No date: PAF (paroxysmal atrial fibrillation) (HCC)     Comment:  a.) CHA2DS2VASc = 6 (age x2, HTN, CVA/PE x 2, prior               MI/vascular disease) as of 02/24/2024; b.) rate/rhythm               maintained on oral amiodarone ; chronically anticoagulated              with rivaroxaban  No date: Prostate cancer (HCC) No date: PSVT (paroxysmal supraventricular tachycardia) (HCC) 2006: Pulmonary embolism (HCC) No date: Skin cancer No date: Wears dentures     Comment:  partial upper and lower No date: Wears hearing aid in both ears  Past Surgical History: 05/22/2011: CATARACT EXTRACTION; Right 10/01/2023: CATARACT EXTRACTION W/PHACO; Left      Comment:  Procedure: CATARACT EXTRACTION PHACO AND INTRAOCULAR               LENS PLACEMENT (IOC) LEFT 23.44 01:56.2;  Surgeon:               Mittie Gaskin, MD;  Location: [facility];              Service: Ophthalmology;  Laterality: Left; No date: colonoscopy 08/19/2013: COLONOSCOPY W/ POLYPECTOMY     Comment:  Dr Jeri 08/15/2016: COLONOSCOPY WITH PROPOFOL ; N/A     Comment:  Procedure: COLONOSCOPY WITH PROPOFOL ;  Surgeon: Ruel Kung, MD;  Location: [facility] END[facility];  Service: Endoscopy;              Laterality: N/A; 2005: CORONARY ANGIOPLASTY WITH STENT PLACEMENT; Left     Comment:  Procedure: CORONARY ANGIOPLASTY WITH STENT PLACEMENT;               Location: [facility]) 12/26/2010: CORONARY ANGIOPLASTY WITH STENT PLACEMENT; Left     Comment:  Procedure: CORONARY ANGIOPLASTY WITH STENT PLACEMENT;               Location: [facility]; Surgeon: Peter Swaziland, MD 09/22/2018: ENTEROSCOPY; N/A     Comment:  Procedure: ENTEROSCOPY;  Surgeon: Unk Corinn Skiff,               MD;  Location: [facility] ENDOSCOPY;  Service:               Gastroenterology;  Laterality: N/A; 08/15/2016: ESOPHAGOGASTRODUODENOSCOPY (EGD) WITH PROPOFOL ; N/A     Comment:  Procedure: ESOPHAGOGASTRODUODENOSCOPY (EGD) WITH               PROPOFOL ;  Surgeon: Ruel Kung, MD;  Location: [facility]               ENDOSCOPY;  Service: Endoscopy;  Laterality: N/A; 09/26/2016: GIVENS CAPSULE STUDY; N/A     Comment:  Procedure: GIVENS CAPSULE STUDY;  Surgeon: Ruel Kung,               MD;  Location: [facility] ENDOSCOPY;  Service: Endoscopy;  Laterality: N/A; 08/20/2011: HERNIA REPAIR 04/29/2022: LEFT HEART CATH AND CORONARY ANGIOGRAPHY; N/A     Comment:  Procedure: LEFT HEART CATH AND CORONARY ANGIOGRAPHY;                Surgeon: Darron Deatrice LABOR, MD;  Location: [facility] INVAS[facility]               CV LAB;  Service: Cardiovascular;  Laterality: N/A; 2009: LEFT HEART CATH AND CORONARY  ANGIOGRAPHY; Left     Comment:  Procedure: LEFT HEART CATH AND CORONARY ANGIOGRAPHY;               Location: [facility]) 12/24/2010: LEFT HEART CATH AND CORONARY ANGIOGRAPHY; Left     Comment:  Procedure: LEFT HEART CATH AND CORONARY ANGIOGRAPHY;               [facility]: [facility]; Surgeon: [name], MD 10/28/2013: LEFT HEART CATH AND CORONARY ANGIOGRAPHY; Left     Comment:  Procedure: LEFT HEART CATH AND CORONARY ANGIOGRAPHY;               Location: [facility]; Surgeon: [name], MD No date: LUMBAR SPINE SURGERY     Comment:  x2 in [facility] in 1990[facility] 04/19/2014: UPPER GI ENDOSCOPY     Comment:  Dr Jeri     Reproductive/Obstetrics negative OB ROS                              Anesthesia Physical Anesthesia Plan  ASA: 3  Anesthesia Plan: General ETT   Post-op Pain Management:    Induction: Intravenous  PONV Risk Score and Plan: Ondansetron , Dexamethasone , Midazolam  and Treatment may vary due to age or medical condition  Airway Management Planned: Oral ETT  Additional Equipment:   Intra-op Plan:   Post-operative Plan: Extubation in OR  Informed Consent: I have reviewed the patients History and Physical, chart, labs and discussed the procedure including the risks, benefits and alternatives for the proposed anesthesia with the patient or authorized representative who has indicated his/her understanding and acceptance.     Dental Advisory Given  Plan Discussed with: Anesthesiologist, CRNA and Surgeon  Anesthesia Plan Comments: (Patient consented for risks of anesthesia including but not limited to:  - adverse reactions to medications - damage to eyes, teeth, lips or other oral mucosa - nerve damage due to positioning  - sore throat or hoarseness - Damage to heart, brain, nerves, lungs, other parts of body or loss of life  Patient voiced understanding and assent.)        Anesthesia Quick Evaluation

## 2024-03-01 ENCOUNTER — Encounter: Payer: Self-pay | Admitting: General Surgery

## 2024-03-03 ENCOUNTER — Inpatient Hospital Stay
Admission: EM | Admit: 2024-03-03 | Discharge: 2024-03-05 | DRG: 862 | Disposition: A | Discharge status: Home Health | Attending: Obstetrics and Gynecology | Admitting: Obstetrics and Gynecology

## 2024-03-03 ENCOUNTER — Emergency Department

## 2024-03-03 ENCOUNTER — Other Ambulatory Visit: Payer: Self-pay

## 2024-03-03 ENCOUNTER — Telehealth: Payer: Self-pay | Admitting: General Surgery

## 2024-03-03 DIAGNOSIS — L03818 Cellulitis of other sites: Secondary | ICD-10-CM | POA: Diagnosis present

## 2024-03-03 DIAGNOSIS — Z955 Presence of coronary angioplasty implant and graft: Secondary | ICD-10-CM

## 2024-03-03 DIAGNOSIS — Z6822 Body mass index (BMI) 22.0-22.9, adult: Secondary | ICD-10-CM

## 2024-03-03 DIAGNOSIS — N183 Chronic kidney disease, stage 3 unspecified: Secondary | ICD-10-CM | POA: Diagnosis present

## 2024-03-03 DIAGNOSIS — E785 Hyperlipidemia, unspecified: Secondary | ICD-10-CM | POA: Diagnosis present

## 2024-03-03 DIAGNOSIS — L02211 Cutaneous abscess of abdominal wall: Secondary | ICD-10-CM | POA: Diagnosis present

## 2024-03-03 DIAGNOSIS — Z974 Presence of external hearing-aid: Secondary | ICD-10-CM

## 2024-03-03 DIAGNOSIS — L02214 Cutaneous abscess of groin: Secondary | ICD-10-CM | POA: Diagnosis present

## 2024-03-03 DIAGNOSIS — N401 Enlarged prostate with lower urinary tract symptoms: Secondary | ICD-10-CM | POA: Diagnosis present

## 2024-03-03 DIAGNOSIS — K219 Gastro-esophageal reflux disease without esophagitis: Secondary | ICD-10-CM | POA: Diagnosis present

## 2024-03-03 DIAGNOSIS — T8149XA Infection following a procedure, other surgical site, initial encounter: Principal | ICD-10-CM | POA: Diagnosis present

## 2024-03-03 DIAGNOSIS — Z8249 Family history of ischemic heart disease and other diseases of the circulatory system: Secondary | ICD-10-CM

## 2024-03-03 DIAGNOSIS — Z91030 Bee allergy status: Secondary | ICD-10-CM

## 2024-03-03 DIAGNOSIS — Z888 Allergy status to other drugs, medicaments and biological substances status: Secondary | ICD-10-CM

## 2024-03-03 DIAGNOSIS — Z882 Allergy status to sulfonamides status: Secondary | ICD-10-CM

## 2024-03-03 DIAGNOSIS — R638 Other symptoms and signs concerning food and fluid intake: Secondary | ICD-10-CM

## 2024-03-03 DIAGNOSIS — I252 Old myocardial infarction: Secondary | ICD-10-CM

## 2024-03-03 DIAGNOSIS — R338 Other retention of urine: Secondary | ICD-10-CM | POA: Diagnosis present

## 2024-03-03 DIAGNOSIS — E43 Unspecified severe protein-calorie malnutrition: Secondary | ICD-10-CM | POA: Diagnosis present

## 2024-03-03 DIAGNOSIS — Z8673 Personal history of transient ischemic attack (TIA), and cerebral infarction without residual deficits: Secondary | ICD-10-CM

## 2024-03-03 DIAGNOSIS — Z79899 Other long term (current) drug therapy: Secondary | ICD-10-CM

## 2024-03-03 DIAGNOSIS — I2510 Atherosclerotic heart disease of native coronary artery without angina pectoris: Secondary | ICD-10-CM | POA: Diagnosis present

## 2024-03-03 DIAGNOSIS — Z823 Family history of stroke: Secondary | ICD-10-CM

## 2024-03-03 DIAGNOSIS — Z8719 Personal history of other diseases of the digestive system: Secondary | ICD-10-CM

## 2024-03-03 DIAGNOSIS — Z7901 Long term (current) use of anticoagulants: Secondary | ICD-10-CM

## 2024-03-03 DIAGNOSIS — Z86711 Personal history of pulmonary embolism: Secondary | ICD-10-CM

## 2024-03-03 DIAGNOSIS — I422 Other hypertrophic cardiomyopathy: Secondary | ICD-10-CM | POA: Diagnosis present

## 2024-03-03 DIAGNOSIS — I480 Paroxysmal atrial fibrillation: Secondary | ICD-10-CM | POA: Diagnosis present

## 2024-03-03 DIAGNOSIS — D509 Iron deficiency anemia, unspecified: Secondary | ICD-10-CM | POA: Diagnosis present

## 2024-03-03 DIAGNOSIS — K4090 Unilateral inguinal hernia, without obstruction or gangrene, not specified as recurrent: Secondary | ICD-10-CM | POA: Diagnosis present

## 2024-03-03 DIAGNOSIS — N1831 Chronic kidney disease, stage 3a: Secondary | ICD-10-CM | POA: Diagnosis present

## 2024-03-03 DIAGNOSIS — Z8546 Personal history of malignant neoplasm of prostate: Secondary | ICD-10-CM

## 2024-03-03 DIAGNOSIS — R5381 Other malaise: Secondary | ICD-10-CM | POA: Diagnosis present

## 2024-03-03 DIAGNOSIS — Z85820 Personal history of malignant melanoma of skin: Secondary | ICD-10-CM

## 2024-03-03 DIAGNOSIS — F32A Depression, unspecified: Secondary | ICD-10-CM | POA: Diagnosis present

## 2024-03-03 DIAGNOSIS — Z85828 Personal history of other malignant neoplasm of skin: Secondary | ICD-10-CM

## 2024-03-03 DIAGNOSIS — Z800 Family history of malignant neoplasm of digestive organs: Secondary | ICD-10-CM

## 2024-03-03 DIAGNOSIS — Z860100 Personal history of colon polyps, unspecified: Secondary | ICD-10-CM

## 2024-03-03 DIAGNOSIS — T8141XA Infection following a procedure, superficial incisional surgical site, initial encounter: Secondary | ICD-10-CM | POA: Diagnosis not present

## 2024-03-03 DIAGNOSIS — I10 Essential (primary) hypertension: Secondary | ICD-10-CM | POA: Diagnosis present

## 2024-03-03 DIAGNOSIS — I129 Hypertensive chronic kidney disease with stage 1 through stage 4 chronic kidney disease, or unspecified chronic kidney disease: Secondary | ICD-10-CM | POA: Diagnosis present

## 2024-03-03 LAB — BASIC METABOLIC PANEL WITH GFR
Anion gap: 8 (ref 5–15)
BUN: 19 mg/dL (ref 8–23)
CO2: 22 mmol/L (ref 22–32)
Calcium: 9.5 mg/dL (ref 8.9–10.3)
Chloride: 105 mmol/L (ref 98–111)
Creatinine, Ser: 1.28 mg/dL — ABNORMAL HIGH (ref 0.61–1.24)
GFR, Estimated: 53 mL/min — ABNORMAL LOW (ref >60)
Glucose, Bld: 175 mg/dL — ABNORMAL HIGH (ref 70–99)
Potassium: 3.9 mmol/L (ref 3.5–5.1)
Sodium: 135 mmol/L (ref 135–145)

## 2024-03-03 LAB — URINALYSIS, ROUTINE W REFLEX MICROSCOPIC
Bilirubin Urine: NEGATIVE
Glucose, UA: NEGATIVE mg/dL
Hgb urine dipstick: NEGATIVE
Ketones, ur: NEGATIVE mg/dL
Leukocytes,Ua: NEGATIVE
Nitrite: NEGATIVE
Protein, ur: NEGATIVE mg/dL
Specific Gravity, Urine: 1.035 — ABNORMAL HIGH (ref 1.005–1.030)
pH: 6 (ref 5.0–8.0)

## 2024-03-03 LAB — CBC
HCT: 30.2 % — ABNORMAL LOW (ref 39.0–52.0)
Hemoglobin: 9.8 g/dL — ABNORMAL LOW (ref 13.0–17.0)
MCH: 33.1 pg (ref 26.0–34.0)
MCHC: 32.5 g/dL (ref 30.0–36.0)
MCV: 102 fL — ABNORMAL HIGH (ref 80.0–100.0)
Platelets: 256 K/uL (ref 150–400)
RBC: 2.96 MIL/uL — ABNORMAL LOW (ref 4.22–5.81)
RDW: 14.1 % (ref 11.5–15.5)
WBC: 9.8 K/uL (ref 4.0–10.5)
nRBC: 0 % (ref 0.0–0.2)

## 2024-03-03 MED ORDER — BISACODYL 5 MG PO TBEC
5.0000 mg | DELAYED_RELEASE_TABLET | Freq: Every day | ORAL | Status: DC | PRN
  Administered 2024-03-04: 5 mg via ORAL
  Filled 2024-03-03: qty 1

## 2024-03-03 MED ORDER — VANCOMYCIN HCL 1250 MG/250ML IV SOLN
1250.0000 mg | INTRAVENOUS | Status: AC
  Administered 2024-03-03: 1250 mg via INTRAVENOUS
  Filled 2024-03-03: qty 250

## 2024-03-03 MED ORDER — ONDANSETRON HCL 4 MG PO TABS: 4.0000 mg | ORAL_TABLET | Freq: Four times a day (QID) | ORAL | Status: DC | PRN

## 2024-03-03 MED ORDER — ACETAMINOPHEN 10 MG/ML IV SOLN
1000.0000 mg | Freq: Once | INTRAVENOUS | Status: AC
  Administered 2024-03-03: 1000 mg via INTRAVENOUS
  Filled 2024-03-03: qty 100

## 2024-03-03 MED ORDER — ACETAMINOPHEN 325 MG PO TABS: 650.0000 mg | ORAL_TABLET | Freq: Four times a day (QID) | ORAL | Status: DC | PRN

## 2024-03-03 MED ORDER — SODIUM CHLORIDE 0.9 % IV SOLN
2.0000 g | Freq: Once | INTRAVENOUS | Status: AC
  Administered 2024-03-03: 2 g via INTRAVENOUS
  Filled 2024-03-03: qty 20

## 2024-03-03 MED ORDER — IOHEXOL 300 MG/ML  SOLN
100.0000 mL | Freq: Once | INTRAMUSCULAR | Status: AC | PRN
  Administered 2024-03-03: 100 mL via INTRAVENOUS

## 2024-03-03 MED ORDER — HYDROCODONE-ACETAMINOPHEN 5-325 MG PO TABS
1.0000 | ORAL_TABLET | ORAL | Status: DC | PRN
  Administered 2024-03-04: 2 via ORAL
  Filled 2024-03-03: qty 2

## 2024-03-03 MED ORDER — SODIUM CHLORIDE 0.9 % IV SOLN: INTRAVENOUS | Status: DC

## 2024-03-03 MED ORDER — ONDANSETRON HCL 4 MG/2ML IJ SOLN: 4.0000 mg | Freq: Four times a day (QID) | INTRAMUSCULAR | Status: DC | PRN

## 2024-03-03 MED ORDER — MORPHINE SULFATE (PF) 2 MG/ML IV SOLN: 2.0000 mg | INTRAVENOUS | Status: DC | PRN

## 2024-03-03 MED ORDER — SODIUM CHLORIDE 0.9 % IV BOLUS
1000.0000 mL | Freq: Once | INTRAVENOUS | Status: AC
  Administered 2024-03-03: 1000 mL via INTRAVENOUS

## 2024-03-03 MED ORDER — LIDOCAINE-EPINEPHRINE 2 %-1:100000 IJ SOLN
20.0000 mL | Freq: Once | INTRAMUSCULAR | Status: AC
  Administered 2024-03-03: 20 mL
  Filled 2024-03-03: qty 1

## 2024-03-03 MED ORDER — ACETAMINOPHEN 650 MG RE SUPP: 650.0000 mg | Freq: Four times a day (QID) | RECTAL | Status: DC | PRN

## 2024-03-03 NOTE — Assessment & Plan Note (Signed)
 Continue pravastatin , nitroglycerin , lisinopril 

## 2024-03-03 NOTE — Telephone Encounter (Signed)
 Called patient's wife back, message left for the patient to go to the ED per Dr Marinda for a possible foley catheter placement due to being unable to urinate.

## 2024-03-03 NOTE — Telephone Encounter (Signed)
 Wife called back and will take him to the ED.

## 2024-03-03 NOTE — Assessment & Plan Note (Signed)
 -  Renal function  at baseline

## 2024-03-03 NOTE — Assessment & Plan Note (Signed)
 Continue amiodarone and Xarelto ?

## 2024-03-03 NOTE — Hospital Course (Addendum)
 SABRA

## 2024-03-03 NOTE — H&P (Signed)
 History and Physical    Patient: William Taylor FMW:[id number] DOB: 11/06/1929 DOA: 03/03/2024 DOS: the patient was seen and examined on 03/03/2024 PCP: [name], MD  Patient coming from: Home  Chief Complaint:  Chief Complaint  Patient presents with   Urinary Retention    HPI: William Taylor is a 89 y.o. male with medical history significant for CAD s/p DES-LAD in 2005, paroxysmal atrial fibrillation on amiodarone  and Xarelto , PE, hypertension, orthostatic hypotension, PVCs, hyperlipidemia, CKD stage IIIa, headaches attributed to occipital neuralgia, who is s/p left inguinal hernia repair 5 days ago on 02/27/2024, being admitted with surgical wound infection with cellulitis and abscess, undergoing I&D in the ED after initially presenting with concern for poor urinary output since her surgical procedure.  Patient reports decreased oral intake of both solids and fluids since his surgery and in the past 24 hours noticed that he had very little urinary output.  He denied nausea, vomiting, fever, abdominal pain or change in bowel habits. In the ED, vitals were within normal limits.  Labs notable for chronic stable anemia of 9.8, stable creatinine of 1.2 with, normal urinalysis. CT abdomen and pelvis showed a subcutaneous fluid collection left groin worrisome for abscess and associated fluid and edema left inguinal canal, bilateral scrotal sac and penis indicative of cellulitis.  (Please see full report. I&D performed in the ED Patient started on Rocephin  and vancomycin  The ED provider spoke with on-call surgeon, Dr. Lane who requested hospitalist admission.     Past Medical History:  Diagnosis Date   Acquired dilation of ascending aorta and aortic root (HCC)    Aortic atherosclerosis (HCC)    Atypical chest pain    BPH (benign prostatic hyperplasia)    Cerebral microvascular disease    CKD (chronic kidney disease), stage III (HCC)    Colon polyp    Compression fracture of  T2 vertebra (HCC) 02/26/2023   Coronary artery disease    a.) PCI LAD (Cypher DES) in 2005; b.)  LHC 2009: no obstructive CAD and patent stent; c.) PCI OM2 12/26/2010 (2.25 x 16 mm Promus Element); d.) LHC 10/28/2013: LAD 108m ISR, 59m, D1 90, D2 70, OM2 80 w/ patent stent;  e.) 06/2019 Low risk MV; f.) 07/2020 MV: EF 77%, no ischemia/infarct.   CVA (cerebral vascular accident) [facility])    a.) age indeterminate infarcts in the RIGHT cerebellum and LEFT parietal lobe   Depression    Dyspnea    Gall stones    GERD (gastroesophageal reflux disease)    Glaucoma    Hyperlipidemia    Hypertension    Hypertrophic nonobstructive cardiomyopathy (with severe asymmetric septal hypertrophy)    IDA (iron deficiency anemia)    Left inguinal hernia    Leg weakness    Long term current use of amiodarone     Melanoma (HCC)    Multinodular thyroid     NSTEMI (non-ST elevated myocardial infarction) (HCC) 10/26/2013   a.) experienced in setting of antithrombotic hold (clopidogrel ) for routine colonoscopy; b.) troponins trended: 1.00 --> 5.10 --> 6.20 ng/mL; c.) LHC 10/31/2013: 10/40% mLAD, 90/70% D1, 80% OM1 -- med mgmt   NSTEMI (non-ST elevated myocardial infarction) (HCC) 12/24/2010   a.) LHC 12/24/2010: 30 % LAD, 30% ISR mLAD, 50% dLAD, 95% OM2 --> d/t distal bifurcation lesion unable to see RCA --> transfer to [facility] --> PCI 12/26/2010 placing a 2.25 x 16 mm Promus Element DES to OM2   On rivaroxaban  therapy    Orthostatic hypotension  a. prev on midodrine /florinef /northera .   PAF (paroxysmal atrial fibrillation) (HCC)    a.) CHA2DS2VASc = 6 (age x2, HTN, CVA/PE x 2, prior MI/vascular disease) as of 02/24/2024; b.) rate/rhythm maintained on oral amiodarone ; chronically anticoagulated with rivaroxaban    Prostate cancer (HCC)    PSVT (paroxysmal supraventricular tachycardia) (HCC)    Pulmonary embolism (HCC) 2006   Skin cancer    Wears dentures    partial upper and lower   Wears hearing aid in both ears     Past Surgical History:  Procedure Laterality Date   CATARACT EXTRACTION Right 05/22/2011   CATARACT EXTRACTION W/PHACO Left 10/01/2023   Procedure: CATARACT EXTRACTION PHACO AND INTRAOCULAR LENS PLACEMENT (IOC) LEFT 23.44 01:56.2;  Surgeon: Mittie Gaskin, MD;  Location: [facility];  Service: Ophthalmology;  Laterality: Left;   colonoscopy     COLONOSCOPY W/ POLYPECTOMY  08/19/2013   Dr Jeri   COLONOSCOPY WITH PROPOFOL  N/A 08/15/2016   Procedure: COLONOSCOPY WITH PROPOFOL ;  Surgeon: Ruel Kung, MD;  Location: [facility] END[facility]COPY;  Service: Endoscopy;  Laterality: N/A;   CORONARY ANGIOPLASTY WITH STENT PLACEMENT Left 2005   Procedure: CORONARY ANGIOPLASTY WITH STENT PLACEMENT; Location: [facility])   CORONARY ANGIOPLASTY WITH STENT PLACEMENT Left 12/26/2010   Procedure: CORONARY ANGIOPLASTY WITH STENT PLACEMENT; Location: [facility]; Surgeon: Peter Swaziland, MD   ENTEROSCOPY N/A 09/22/2018   Procedure: ENTEROSCOPY;  Surgeon: Unk Corinn Skiff, MD;  Location: [facility] ENDOSCOPY;  Service: Gastroenterology;  Laterality: N/A;   ESOPHAGOGASTRODUODENOSCOPY (EGD) WITH PROPOFOL  N/A 08/15/2016   Procedure: ESOPHAGOGASTRODUODENOSCOPY (EGD) WITH PROPOFOL ;  Surgeon: Ruel Kung, MD;  Location: [facility] ENDOSCOPY;  Service: Endoscopy;  Laterality: N/A;   GIVENS CAPSULE STUDY N/A 09/26/2016   Procedure: GIVENS CAPSULE STUDY;  Surgeon: Ruel Kung, MD;  Location: [facility] ENDOSCOPY;  Service: Endoscopy;  Laterality: N/A;   HERNIA REPAIR  08/20/2011   INGUINAL HERNIA REPAIR Left 02/27/2024   Procedure: REPAIR, HERNIA, INGUINAL, ADULT, RECURRENT;  Surgeon: Marinda Jayson KIDD, MD;  Location: [facility] ORS;  Service: General;  Laterality: Left;  open procedure with mesh   LEFT HEART CATH AND CORONARY ANGIOGRAPHY N/A 04/29/2022   Procedure: LEFT HEART CATH AND CORONARY ANGIOGRAPHY;  Surgeon: Darron Deatrice LABOR, MD;  Location: [facility] INVASIVE [facility];  Service: Cardiovascular;  Laterality: N/A;   LEFT HEART  CATH AND CORONARY ANGIOGRAPHY Left 2009   Procedure: LEFT HEART CATH AND CORONARY ANGIOGRAPHY; Location[facility])   LEFT HEART CATH AND CORONARY ANGIOGRAPHY Left 12/24/2010   Procedure: LEFT HEART CATH AND CORONARY ANGIOGRAPHY; Locaiton: [facility]; Surgeon: [name], MD   LEFT HEART CATH AND CORONARY ANGIOGRAPHY Left 10/28/2013   Procedure: LEFT HEART CATH AND CORONARY ANGIOGRAPHY; Location: [facility]; Surgeon: [name], MD   LUMBAR SPINE SURGERY     x2 in [facility] in [phone]   UPPER GI ENDOSCOPY  04/19/2014   Dr Jeri   Social History:  reports that he has never smoked. He has never used smokeless tobacco. He reports that he does not drink alcohol and does not use drugs.  Allergies  Allergen Reactions   Bee Venom Swelling   Sulfasalazine Hives and Swelling   Sulfa Antibiotics Hives and Swelling   Warfarin     Chest pain   Warfarin And Related Other (See Comments)    Chest pain    Family History  Problem Relation Age of Onset   Stroke Mother    Colon cancer Father    Coronary artery disease Brother    Other Brother 22  lymes dz    Prior to Admission medications   Medication Sig Start Date End Date Taking? Authorizing Provider  acetaminophen  (TYLENOL ) 500 MG tablet Take 500 mg by mouth every 6 (six) hours as needed for moderate pain (pain score 4-6).   Yes [provider]  amiodarone  (PACERONE ) 100 MG tablet Take 1 tablet (100 mg total) by mouth daily. 12/01/23  Yes Gollan, Timothy J, MD  cholecalciferol  (VITAMIN D3) 25 MCG (1000 UNIT) tablet Take 1,000 Units by mouth once a week.   Yes [provider]  fenofibrate  micronized (ANTARA ) 130 MG capsule TAKE 1 CAPSULE BY MOUTH DAILY  BEFORE BREAKFAST 03/06/23  Yes Maribeth Camellia MATSU, MD  finasteride  (PROSCAR ) 5 MG tablet TAKE 1 TABLET BY MOUTH DAILY 06/05/23  Yes Stoioff, Glendia BROCKS, MD  levobunolol  (BETAGAN ) 0.5 % ophthalmic solution Place 1 drop into the right eye 2 (two) times daily.   Yes  [provider]  lisinopril  (ZESTRIL ) 5 MG tablet Take 1 tablet (5 mg total) by mouth daily. 07/27/23  Yes [name], MD  loratadine  (CLARITIN ) 10 MG tablet Take 1 tablet (10 mg total) by mouth daily. 08/30/20  Yes Maribeth Camellia MATSU, MD  Multiple Vitamin (MULTIVITAMIN PO) Take 1 tablet by mouth daily.   Yes [provider]  nitroGLYCERIN  (NITROSTAT ) 0.4 MG SL tablet DISSOLVE UNDER THE TONGUE 1 TABLET EVERY 5 MINUTES AS NEEDED FOR CHEST PAIN 04/30/23  Yes Maribeth Camellia MATSU, MD  oxyCODONE  (OXY IR/ROXICODONE ) 5 MG immediate release tablet Take 1 tablet (5 mg total) by mouth every 6 (six) hours as needed for severe pain (pain score 7-10). 02/27/24  Yes Marinda Jayson KIDD, MD  pantoprazole  (PROTONIX ) 40 MG tablet TAKE ONE (1) TABLET BY MOUTH TWO TIMES PER DAY 06/12/23  Yes Therisa Bi, MD  pravastatin  (PRAVACHOL ) 40 MG tablet Take 1 tablet (40 mg total) by mouth at bedtime. 11/18/23  Yes [name], MD  Rivaroxaban  (XARELTO ) 15 MG TABS tablet Take 1 tablet (15 mg total) by mouth daily. 03/01/24  Yes Marinda Jayson KIDD, MD    Physical Exam: Vitals:   03/03/24 1302 03/03/24 1303  BP: (!) 145/91   Pulse: 96   Resp: 16   Temp: 97.9 F (36.6 C)   TempSrc: Oral   SpO2: 100%   Weight:  84.4 kg  Height:  6' 4 (1.93 m)   Physical Exam Vitals and nursing note reviewed.  Constitutional:      General: He is not in acute distress. HENT:     Head: Normocephalic and atraumatic.  Cardiovascular:     Rate and Rhythm: Normal rate and regular rhythm.     Heart sounds: Normal heart sounds.  Pulmonary:     Effort: Pulmonary effort is normal.     Breath sounds: Normal breath sounds.  Abdominal:     Palpations: Abdomen is soft.     Tenderness: There is no abdominal tenderness.     Comments: Redness around surgical wound I&D  wick packing at medial end of surgical wound.  See pic  Neurological:     Mental Status: Mental status is at baseline.     Labs on Admission: I have  personally reviewed following labs and imaging studies  CBC: Recent Labs  Lab 03/03/24 1305  WBC 9.8  HGB 9.8*  HCT 30.2*  MCV 102.0*  PLT 256   Basic Metabolic Panel: Recent Labs  Lab 03/03/24 1305  NA 135  K 3.9  CL 105  CO2 22  GLUCOSE 175*  BUN 19  CREATININE 1.28*  CALCIUM 9.5   GFR: Estimated Creatinine Clearance: 44 mL/min (A) (by C-G formula based on SCr of 1.28 mg/dL (H)). Liver Function Tests: No results for input(s): AST, ALT, ALKPHOS, BILITOT, PROT, ALBUMIN in the last 168 hours. No results for input(s): LIPASE, AMYLASE in the last 168 hours. No results for input(s): AMMONIA in the last 168 hours. Coagulation Profile: No results for input(s): INR, PROTIME in the last 168 hours. Cardiac Enzymes: No results for input(s): CKTOTAL, CKMB, CKMBINDEX, TROPONINI in the last 168 hours. BNP (last 3 results) No results for input(s): PROBNP in the last 8760 hours. HbA1C: No results for input(s): HGBA1C in the last 72 hours. CBG: No results for input(s): GLUCAP in the last 168 hours. Lipid Profile: No results for input(s): CHOL, HDL, LDLCALC, TRIG, CHOLHDL, LDLDIRECT in the last 72 hours. Thyroid  Function Tests: No results for input(s): TSH, T4TOTAL, FREET4, T3FREE, THYROIDAB in the last 72 hours. Anemia Panel: No results for input(s): VITAMINB12, FOLATE, FERRITIN, TIBC, IRON, RETICCTPCT in the last 72 hours. Urine analysis:    Component Value Date/Time   COLORURINE YELLOW (A) 03/03/2024 2003   APPEARANCEUR CLEAR (A) 03/03/2024 2003   APPEARANCEUR Hazy 02/27/2014 1335   LABSPEC 1.035 (H) 03/03/2024 2003   LABSPEC 1.017 02/27/2014 1335   PHURINE 6.0 03/03/2024 2003   GLUCOSEU NEGATIVE 03/03/2024 2003   GLUCOSEU Negative 02/27/2014 1335   HGBUR NEGATIVE 03/03/2024 2003   BILIRUBINUR NEGATIVE 03/03/2024 2003   BILIRUBINUR negative 03/28/2022 1122   BILIRUBINUR Negative 02/27/2014 1335    KETONESUR NEGATIVE 03/03/2024 2003   PROTEINUR NEGATIVE 03/03/2024 2003   UROBILINOGEN 1.0 03/28/2022 1122   NITRITE NEGATIVE 03/03/2024 2003   LEUKOCYTESUR NEGATIVE 03/03/2024 2003   LEUKOCYTESUR Negative 02/27/2014 1335    Radiological Exams on Admission: CT ABDOMEN PELVIS W CONTRAST Result Date: 03/03/2024 CLINICAL DATA:  Postop abdominal pain. Recent hernia repair. Difficulty urinating. Testicular swelling. EXAM: CT ABDOMEN AND PELVIS WITH CONTRAST TECHNIQUE: Multidetector CT imaging of the abdomen and pelvis was performed using the standard protocol following bolus administration of intravenous contrast. RADIATION DOSE REDUCTION: This exam was performed according to the departmental dose-optimization program which includes automated exposure control, adjustment of the mA and/or kV according to patient size and/or use of iterative reconstruction technique. CONTRAST:  OMNIPAQUE  IOHEXOL  300 MG/ML  SOLN COMPARISON:  11/09/2021. FINDINGS: Lower chest: No acute findings. Atherosclerotic calcification of the aorta, aortic valve and coronary arteries. Heart is at the upper limits of normal in size. No pericardial or pleural effusion. Distal esophagus is grossly unremarkable. Hepatobiliary: Probable tiny cysts in the liver. No specific follow-up necessary. Gallstones. No biliary ductal dilatation. Pancreas: Negative. Spleen: Negative. Adrenals/Urinary Tract: Adrenal glands are unremarkable. Renal cortical thinning bilaterally. Small low-attenuation lesion in the right kidney. No specific follow-up necessary. Ureters are decompressed. Bladder is grossly unremarkable. Stomach/Bowel: Gastric wall thickening. Stomach, small bowel, appendix and colon are otherwise unremarkable. Vascular/Lymphatic: Atherosclerotic calcification of the aorta. No pathologically enlarged lymph nodes. Reproductive: Prostate is visualized. Soft tissue edema and fluid along the penis and scrotum. Other: Soft tissue haziness superior  to the left inguinal canal, extending into the left inguinal canal and along the bilateral scrotum and penis. Overlying subcutaneous edema. A fluid collection with internal air is seen in the left groin, measuring 2.4 x 5.3 cm (2/93). Otherwise, no additional soft tissue gas. No free fluid. Mesenteries and peritoneum are unremarkable. Musculoskeletal: Osteopenia.  Degenerative changes in the spine. IMPRESSION: 1. Subcutaneous collection of  fluid and air in the left groin, worrisome for an abscess. Associated fluid and edema in the left inguinal canal, bilateral scrotal sac and penis, indicative of cellulitis. No additional soft tissue gas to suggest gangrene. 2. Cholelithiasis. 3. Gastric wall thickening. 4. Aortic atherosclerosis (ICD10-I70.0). Coronary artery calcification. Electronically Signed   By: Newell Eke M.D.   On: 03/03/2024 17:55   Data Reviewed for HPI: Relevant notes from primary care and specialist visits, past discharge summaries as available in EHR, including Care Everywhere. Prior diagnostic testing as pertinent to current admission diagnoses Updated medications and problem lists for reconciliation ED course, including vitals, labs, imaging, treatment and response to treatment Triage notes, nursing and pharmacy notes and ED provider's notes Notable results as noted above in HPI      Assessment and Plan: * Surgical wound infection,  inguinal abscess and cellulitis S/p left inguinal hernia repair on 02/27/2024 Continue Rocephin  and vancomycin  Pain control Surgical consult to follow Wound care  Inadequate oral intake Patient presents with poor oral intake and decreased urinary input however creatinine normal IV hydration Can consider nutritionist evaluation  CAD S/P percutaneous coronary angioplasty Continue pravastatin , nitroglycerin , lisinopril   Paroxysmal atrial fibrillation (HCC) Continue amiodarone  and Xarelto   Hypertension Continue lisinopril   History of  pulmonary embolism Continue Xarelto   CKD (chronic kidney disease), stage IIIa Renal function at baseline        DVT prophylaxis: xarelto   Consults: Surgery, Dr. Vickie  Advance Care Planning:   Code Status: Full Code   Family Communication: none  Disposition Plan: Back to previous home environment  Severity of Illness: The appropriate patient status for this patient is OBSERVATION. Observation status is judged to be reasonable and necessary in order to provide the required intensity of service to ensure the patient's safety. The patient's presenting symptoms, physical exam findings, and initial radiographic and laboratory data in the context of their medical condition is felt to place them at decreased risk for further clinical deterioration. Furthermore, it is anticipated that the patient will be medically stable for discharge from the hospital within 2 midnights of admission.   Author: Delayne LULLA Solian, MD 03/03/2024 9:15 PM  For on call review [email or web address].

## 2024-03-03 NOTE — Assessment & Plan Note (Signed)
 Patient presents with poor oral intake and decreased urinary input however creatinine normal IV hydration Can consider nutritionist evaluation

## 2024-03-03 NOTE — Telephone Encounter (Signed)
 Pt wife called and is very concerned that the patient not being able to uninate. His surgery was last Friday and he is not able to go and he is very swollen in his penis. She is concerned since her former husband had this issue and almost had to go on dialysis.He has a post op on 03-11-2024. Pt number is [phone].

## 2024-03-03 NOTE — Assessment & Plan Note (Signed)
 Continue lisinopril

## 2024-03-03 NOTE — ED Provider Notes (Signed)
 [facility] Provider Note    Event Date/Time   First MD Initiated Contact with Patient 03/03/24 1521     (approximate)   History   Chief Complaint: Urinary Retention   HPI  William Taylor is a 89 y.o. male with a history of hypertension, paroxysmal atrial fibrillation, prior stroke who underwent open surgical repair of left inguinal hernia 5 days ago.  Since then he reports he has not been eating much or drinking fluids.  He reports having no urine output since yesterday.  Denies chest pain shortness of breath or fever.        Past Medical History:  Diagnosis Date   Acquired dilation of ascending aorta and aortic root (HCC)    Aortic atherosclerosis (HCC)    Atypical chest pain    BPH (benign prostatic hyperplasia)    Cerebral microvascular disease    CKD (chronic kidney disease), stage III (HCC)    Colon polyp    Compression fracture of T2 vertebra (HCC) 02/26/2023   Coronary artery disease    a.) PCI LAD (Cypher DES) in 2005; b.)  LHC 2009: no obstructive CAD and patent stent; c.) PCI OM2 12/26/2010 (2.25 x 16 mm Promus Element); d.) LHC 10/28/2013: LAD 65m ISR, 7m, D1 90, D2 70, OM2 80 w/ patent stent;  e.) 06/2019 Low risk MV; f.) 07/2020 MV: EF 77%, no ischemia/infarct.   CVA (cerebral vascular accident) [facility])    a.) age indeterminate infarcts in the RIGHT cerebellum and LEFT parietal lobe   Depression    Dyspnea    Gall stones    GERD (gastroesophageal reflux disease)    Glaucoma    Hyperlipidemia    Hypertension    Hypertrophic nonobstructive cardiomyopathy (with severe asymmetric septal hypertrophy)    IDA (iron deficiency anemia)    Left inguinal hernia    Leg weakness    Long term current use of amiodarone     Melanoma (HCC)    Multinodular thyroid     NSTEMI (non-ST elevated myocardial infarction) (HCC) 10/26/2013   a.) experienced in setting of antithrombotic hold (clopidogrel ) for routine colonoscopy; b.) troponins trended:  1.00 --> 5.10 --> 6.20 ng/mL; c.) LHC 10/31/2013: 10/40% mLAD, 90/70% D1, 80% OM1 -- med mgmt   NSTEMI (non-ST elevated myocardial infarction) (HCC) 12/24/2010   a.) LHC 12/24/2010: 30 % LAD, 30% ISR mLAD, 50% dLAD, 95% OM2 --> d/t distal bifurcation lesion unable to see RCA --> transfer to [facility] --> PCI 12/26/2010 placing a 2.25 x 16 mm Promus Element DES to OM2   On rivaroxaban  therapy    Orthostatic hypotension    a. prev on midodrine /florinef /northera .   PAF (paroxysmal atrial fibrillation) (HCC)    a.) CHA2DS2VASc = 6 (age x2, HTN, CVA/PE x 2, prior MI/vascular disease) as of 02/24/2024; b.) rate/rhythm maintained on oral amiodarone ; chronically anticoagulated with rivaroxaban    Prostate cancer (HCC)    PSVT (paroxysmal supraventricular tachycardia) (HCC)    Pulmonary embolism (HCC) 2006   Skin cancer    Wears dentures    partial upper and lower   Wears hearing aid in both ears     Current Outpatient Rx   Order #: [id number] Class: Historical Med   Order #: [id number] Class: Normal   Order #: [id number] Class: Historical Med   Order #: [id number] Class: Normal   Order #: [id number] Class: Normal   Order #: [id number] Class: Historical Med   Order #: [id number] Class: Normal   Order #: [id number] Class: Normal   Order #: [id number] Class: Historical Med  Order #: [id number] Class: Normal   Order #: [id number] Class: Normal   Order #: [id number] Class: Normal   Order #: [id number] Class: Normal   Order #: [id number] Class: No Print    Past Surgical History:  Procedure Laterality Date   CATARACT EXTRACTION Right 05/22/2011   CATARACT EXTRACTION W/PHACO Left 10/01/2023   Procedure: CATARACT EXTRACTION PHACO AND INTRAOCULAR LENS PLACEMENT (IOC) LEFT 23.44 01:56.2;  Surgeon: Mittie Gaskin, MD;  Location: [facility];  Service: Ophthalmology;  Laterality: Left;   colonoscopy     COLONOSCOPY W/ POLYPECTOMY  08/19/2013   Dr Jeri   COLONOSCOPY WITH PROPOFOL  N/A 08/15/2016   Procedure:  COLONOSCOPY WITH PROPOFOL ;  Surgeon: Ruel Kung, MD;  Location: [facility] ENDOSCOPY;  Service: Endoscopy;  Laterality: N/A;   CORONARY ANGIOPLASTY WITH STENT PLACEMENT Left 2005   Procedure: CORONARY ANGIOPLASTY WITH STENT PLACEMENT; Location: [facility])   CORONARY ANGIOPLASTY WITH STENT PLACEMENT Left 12/26/2010   Procedure: CORONARY ANGIOPLASTY WITH STENT PLACEMENT; Location: [facility]; Surgeon: Peter Swaziland, MD   ENTEROSCOPY N/A 09/22/2018   Procedure: ENTEROSCOPY;  Surgeon: Unk Corinn Skiff, MD;  Location: [facility] ENDOSCOPY;  Service: Gastroenterology;  Laterality: N/A;   ESOPHAGOGASTRODUODENOSCOPY (EGD) WITH PROPOFOL  N/A 08/15/2016   Procedure: ESOPHAGOGASTRODUODENOSCOPY (EGD) WITH PROPOFOL ;  Surgeon: Ruel Kung, MD;  Location: [facility] ENDOSCOPY;  Service: Endoscopy;  Laterality: N/A;   GIVENS CAPSULE STUDY N/A 09/26/2016   Procedure: GIVENS CAPSULE STUDY;  Surgeon: Ruel Kung, MD;  Location: [facility] ENDOSCOPY;  Service: Endoscopy;  Laterality: N/A;   HERNIA REPAIR  08/20/2011   INGUINAL HERNIA REPAIR Left 02/27/2024   Procedure: REPAIR, HERNIA, INGUINAL, ADULT, RECURRENT;  Surgeon: Marinda Jayson KIDD, MD;  Location: [facility] ORS;  Service: General;  Laterality: Left;  open procedure with mesh   LEFT HEART CATH AND CORONARY ANGIOGRAPHY N/A 04/29/2022   Procedure: LEFT HEART CATH AND CORONARY ANGIOGRAPHY;  Surgeon: Darron Deatrice LABOR, MD;  Location: [facility] INVASIVE CV LAB;  Service: Cardiovascular;  Laterality: N/A;   LEFT HEART CATH AND CORONARY ANGIOGRAPHY Left 2009   Procedure: LEFT HEART CATH AND CORONARY ANGIOGRAPHY; Location: [facility])   LEFT HEART CATH AND CORONARY ANGIOGRAPHY Left 12/24/2010   Procedure: LEFT HEART CATH AND CORONARY ANGIOGRAPHY; Locaiton: [facility]; Surgeon: [name], MD   LEFT HEART CATH AND CORONARY ANGIOGRAPHY Left 10/28/2013   Procedure: LEFT HEART CATH AND CORONARY ANGIOGRAPHY; Location: [facility]; Surgeon: [name], MD   LUMBAR SPINE SURGERY      x2 in [facility] in [phone]   UPPER GI ENDOSCOPY  04/19/2014   Dr Jeri    Physical Exam   Triage Vital Signs: ED Triage Vitals  Encounter Vitals Group     BP 03/03/24 1302 (!) 145/91     Girls Systolic BP Percentile --      Girls Diastolic BP Percentile --      Boys Systolic BP Percentile --      Boys Diastolic BP Percentile --      Pulse Rate 03/03/24 1302 96     Resp 03/03/24 1302 16     Temp 03/03/24 1302 97.9 F (36.6 C)     Temp Source 03/03/24 1302 Oral     SpO2 03/03/24 1302 100 %     Weight 03/03/24 1303 186 lb 1.1 oz (84.4 kg)     Height 03/03/24 1303 6' 4 (1.93 m)     Head Circumference --      Peak Flow --      Pain Score 03/03/24 1303 6  Pain Loc --      Pain Education --      Exclude from Growth Chart --     Most recent vital signs: Vitals:   03/03/24 1302  BP: (!) 145/91  Pulse: 96  Resp: 16  Temp: 97.9 F (36.6 C)  SpO2: 100%    General: Awake, no distress.  CV:  Good peripheral perfusion.  Regular rate Resp:  Normal effort.  Abd:  No distention.  Soft with induration and erythema with ecchymosis over the left lower quadrant abdomen.  Surgical incision is intact and not inflamed, no drainage. Other:  Dry oral mucosa   ED Results / Procedures / Treatments   Labs (all labs ordered are listed, but only abnormal results are displayed) Labs Reviewed  BASIC METABOLIC PANEL WITH GFR - Abnormal; Notable for the following components:      Result Value   Glucose, Bld 175 (*)    Creatinine, Ser 1.28 (*)    GFR, Estimated 53 (*)    All other components within normal limits  CBC - Abnormal; Notable for the following components:   RBC 2.96 (*)    Hemoglobin 9.8 (*)    HCT 30.2 (*)    MCV 102.0 (*)    All other components within normal limits  URINALYSIS, ROUTINE W REFLEX MICROSCOPIC     EKG    RADIOLOGY CT abdomen pelvis interpreted by me, shows abdominal wall abscess in the left lower quadrant, 5 x 10 x 2 cm in dimension.  Radiology report  reviewed   PROCEDURES:  .Incision and Drainage  Date/Time: 03/03/2024 7:33 PM  Performed by: Viviann Pastor, MD Authorized by: Viviann Pastor, MD   Consent:    Consent obtained:  Verbal   Consent given by:  Patient   Risks discussed:  Bleeding, infection, incomplete drainage and pain   Alternatives discussed:  Alternative treatment, delayed treatment and observation Location:    Type:  Surgical wound infection   Size:  10 x 5 x 2 cm   Location:  Trunk   Trunk location:  Abdomen Pre-procedure details:    Skin preparation:  Povidone-iodine Sedation:    Sedation type:  None Anesthesia:    Anesthesia method:  Local infiltration   Local anesthetic:  Lidocaine  1% WITH epi Procedure type:    Complexity:  Complex Procedure details:    Surgical wound: surgical wound reopened     Incision types:  Single straight   Incision depth:  Subcutaneous   Wound management:  Irrigated with saline and probed and deloculated   Drainage:  Purulent and serosanguinous   Drainage amount:  Moderate   Packing materials:  1/4 in iodoform gauze Post-procedure details:    Procedure completion:  Tolerated well, no immediate complications    MEDICATIONS ORDERED IN ED: Medications  lidocaine -EPINEPHrine  (XYLOCAINE  W/EPI) 2 %-1:100000 (with pres) injection 20 mL (has no administration in time range)  vancomycin  (VANCOREADY) IVPB 1250 mg/250 mL (has no administration in time range)  cefTRIAXone  (ROCEPHIN ) 2 g in sodium chloride  0.9 % 100 mL IVPB (has no administration in time range)  sodium chloride  0.9 % bolus 1,000 mL (1,000 mLs Intravenous New Bag/Given 03/03/24 1654)  acetaminophen  (OFIRMEV ) IV 1,000 mg (0 mg Intravenous Stopped 03/03/24 1838)  iohexol  (OMNIPAQUE ) 300 MG/ML solution 100 mL (100 mLs Intravenous Contrast Given 03/03/24 1732)     IMPRESSION / MDM / ASSESSMENT AND PLAN / ED COURSE  I reviewed the triage vital signs and the nursing notes.  DDx: Abdominal wall hematoma,  seroma,  surgical wound infection/abscess, edema, dehydration, AKI, electrolyte derangement  Patient's presentation is most consistent with acute presentation with potential threat to life or bodily function.  Patient presents with decreased urine output since having surgery 5 days ago.  Bladder scan negative for urinary retention.  Suspect dehydration.  Labs are okay, will give IV fluids.  Will obtain CT abdomen pelvis due to indurated erythematous peri-incisional area on the abdominal wall.   Clinical Course as of 03/03/24 1933  Wed Mar 03, 2024  1909 I&D performed via reopening of medial 3cm of surgical incision. Thin purulent drainage.  Packing in place [PS]    Clinical Course User Index [PS] Viviann Pastor, MD     ----------------------------------------- 7:33 PM on 03/03/2024 ----------------------------------------- Plan discussed with surgery Dr. Lane.  Agrees with reopening surgical incision to allow for some initial drainage/source control.  Patient is not septic.  Case discussed with hospitalist  FINAL CLINICAL IMPRESSION(S) / ED DIAGNOSES   Final diagnoses:  Abdominal wall abscess at site of surgical wound     Rx / DC Orders   ED Discharge Orders     None        Note:  This document was prepared using Dragon voice recognition software and may include unintentional dictation errors.   Viviann Pastor, MD 03/03/24 [phone]

## 2024-03-03 NOTE — ED Triage Notes (Signed)
 Pt to ED via POV from home. Pt reports recent hernia repair. Pt reports yesterday noticed more difficulty to urinate and today has been unable to urinate. Pt reports swelling to testicles and penis. Denies abd pain, N/V/D or fevers.

## 2024-03-03 NOTE — Assessment & Plan Note (Signed)
 Continue Xarelto

## 2024-03-03 NOTE — Assessment & Plan Note (Signed)
 S/p left inguinal hernia repair on 02/27/2024 Continue Rocephin  and vancomycin  Pain control Surgical consult to follow Wound care

## 2024-03-04 DIAGNOSIS — Z6822 Body mass index (BMI) 22.0-22.9, adult: Secondary | ICD-10-CM | POA: Diagnosis not present

## 2024-03-04 DIAGNOSIS — Z7901 Long term (current) use of anticoagulants: Secondary | ICD-10-CM | POA: Diagnosis not present

## 2024-03-04 DIAGNOSIS — Z85820 Personal history of malignant melanoma of skin: Secondary | ICD-10-CM | POA: Diagnosis not present

## 2024-03-04 DIAGNOSIS — I480 Paroxysmal atrial fibrillation: Secondary | ICD-10-CM | POA: Diagnosis present

## 2024-03-04 DIAGNOSIS — Z85828 Personal history of other malignant neoplasm of skin: Secondary | ICD-10-CM | POA: Diagnosis not present

## 2024-03-04 DIAGNOSIS — R338 Other retention of urine: Secondary | ICD-10-CM | POA: Diagnosis present

## 2024-03-04 DIAGNOSIS — D509 Iron deficiency anemia, unspecified: Secondary | ICD-10-CM | POA: Diagnosis present

## 2024-03-04 DIAGNOSIS — I129 Hypertensive chronic kidney disease with stage 1 through stage 4 chronic kidney disease, or unspecified chronic kidney disease: Secondary | ICD-10-CM | POA: Diagnosis present

## 2024-03-04 DIAGNOSIS — L02211 Cutaneous abscess of abdominal wall: Secondary | ICD-10-CM | POA: Diagnosis present

## 2024-03-04 DIAGNOSIS — T8141XA Infection following a procedure, superficial incisional surgical site, initial encounter: Secondary | ICD-10-CM | POA: Diagnosis present

## 2024-03-04 DIAGNOSIS — N1831 Chronic kidney disease, stage 3a: Secondary | ICD-10-CM | POA: Diagnosis present

## 2024-03-04 DIAGNOSIS — L03818 Cellulitis of other sites: Secondary | ICD-10-CM | POA: Diagnosis present

## 2024-03-04 DIAGNOSIS — E43 Unspecified severe protein-calorie malnutrition: Secondary | ICD-10-CM | POA: Diagnosis present

## 2024-03-04 DIAGNOSIS — Z955 Presence of coronary angioplasty implant and graft: Secondary | ICD-10-CM | POA: Diagnosis not present

## 2024-03-04 DIAGNOSIS — K4090 Unilateral inguinal hernia, without obstruction or gangrene, not specified as recurrent: Secondary | ICD-10-CM | POA: Diagnosis present

## 2024-03-04 DIAGNOSIS — I2510 Atherosclerotic heart disease of native coronary artery without angina pectoris: Secondary | ICD-10-CM | POA: Diagnosis present

## 2024-03-04 DIAGNOSIS — Z823 Family history of stroke: Secondary | ICD-10-CM | POA: Diagnosis not present

## 2024-03-04 DIAGNOSIS — T8149XA Infection following a procedure, other surgical site, initial encounter: Secondary | ICD-10-CM | POA: Diagnosis present

## 2024-03-04 DIAGNOSIS — Z8249 Family history of ischemic heart disease and other diseases of the circulatory system: Secondary | ICD-10-CM | POA: Diagnosis not present

## 2024-03-04 DIAGNOSIS — F32A Depression, unspecified: Secondary | ICD-10-CM | POA: Diagnosis present

## 2024-03-04 DIAGNOSIS — I422 Other hypertrophic cardiomyopathy: Secondary | ICD-10-CM | POA: Diagnosis present

## 2024-03-04 DIAGNOSIS — N401 Enlarged prostate with lower urinary tract symptoms: Secondary | ICD-10-CM | POA: Diagnosis present

## 2024-03-04 DIAGNOSIS — E785 Hyperlipidemia, unspecified: Secondary | ICD-10-CM | POA: Diagnosis present

## 2024-03-04 DIAGNOSIS — I252 Old myocardial infarction: Secondary | ICD-10-CM | POA: Diagnosis not present

## 2024-03-04 DIAGNOSIS — L02214 Cutaneous abscess of groin: Secondary | ICD-10-CM | POA: Diagnosis present

## 2024-03-04 MED ORDER — FINASTERIDE 5 MG PO TABS
5.0000 mg | ORAL_TABLET | Freq: Every day | ORAL | Status: DC
  Administered 2024-03-04 – 2024-03-05 (×2): 5 mg via ORAL
  Filled 2024-03-04 (×2): qty 1

## 2024-03-04 MED ORDER — KATE FARMS STANDARD 1.4 PO LIQD: 325.0000 mL | Freq: Every day | ORAL | Status: DC

## 2024-03-04 MED ORDER — AMOXICILLIN-POT CLAVULANATE 875-125 MG PO TABS
1.0000 | ORAL_TABLET | Freq: Two times a day (BID) | ORAL | Status: DC
  Administered 2024-03-04 – 2024-03-05 (×3): 1 via ORAL
  Filled 2024-03-04 (×3): qty 1

## 2024-03-04 MED ORDER — VITAMIN D 25 MCG (1000 UNIT) PO TABS
1000.0000 [IU] | ORAL_TABLET | ORAL | Status: DC
  Administered 2024-03-04: 1000 [IU] via ORAL
  Filled 2024-03-04: qty 1

## 2024-03-04 MED ORDER — LISINOPRIL 10 MG PO TABS
5.0000 mg | ORAL_TABLET | Freq: Every day | ORAL | Status: DC
  Administered 2024-03-04 – 2024-03-05 (×2): 5 mg via ORAL
  Filled 2024-03-04 (×2): qty 1

## 2024-03-04 MED ORDER — ADULT MULTIVITAMIN W/MINERALS CH
1.0000 | ORAL_TABLET | Freq: Every day | ORAL | Status: DC
  Administered 2024-03-05: 1 via ORAL
  Filled 2024-03-04: qty 1

## 2024-03-04 MED ORDER — VITAMIN C 500 MG PO TABS
500.0000 mg | ORAL_TABLET | Freq: Two times a day (BID) | ORAL | Status: DC
  Administered 2024-03-04 – 2024-03-05 (×2): 500 mg via ORAL
  Filled 2024-03-04 (×2): qty 1

## 2024-03-04 MED ORDER — RIVAROXABAN 15 MG PO TABS
15.0000 mg | ORAL_TABLET | Freq: Every day | ORAL | Status: DC
  Administered 2024-03-04: 15 mg via ORAL
  Filled 2024-03-04 (×2): qty 1

## 2024-03-04 MED ORDER — PANTOPRAZOLE SODIUM 40 MG PO TBEC
40.0000 mg | DELAYED_RELEASE_TABLET | Freq: Every day | ORAL | Status: DC
  Administered 2024-03-04 – 2024-03-05 (×2): 40 mg via ORAL
  Filled 2024-03-04 (×2): qty 1

## 2024-03-04 MED ORDER — LEVOBUNOLOL HCL 0.5 % OP SOLN
1.0000 [drp] | Freq: Two times a day (BID) | OPHTHALMIC | Status: DC
  Administered 2024-03-04 – 2024-03-05 (×3): 1 [drp] via OPHTHALMIC
  Filled 2024-03-04: qty 5

## 2024-03-04 MED ORDER — BOOST / RESOURCE BREEZE PO LIQD CUSTOM
1.0000 | Freq: Two times a day (BID) | ORAL | Status: DC
  Administered 2024-03-04: 1 via ORAL

## 2024-03-04 MED ORDER — SODIUM CHLORIDE 0.9 % IV SOLN: INTRAVENOUS | Status: DC

## 2024-03-04 MED ORDER — LORATADINE 10 MG PO TABS
10.0000 mg | ORAL_TABLET | Freq: Every day | ORAL | Status: DC
  Filled 2024-03-04 (×2): qty 1

## 2024-03-04 MED ORDER — PRAVASTATIN SODIUM 20 MG PO TABS
40.0000 mg | ORAL_TABLET | Freq: Every day | ORAL | Status: DC
  Administered 2024-03-04: 40 mg via ORAL
  Filled 2024-03-04: qty 2

## 2024-03-04 MED ORDER — FENOFIBRATE 54 MG PO TABS
54.0000 mg | ORAL_TABLET | Freq: Every day | ORAL | Status: DC
  Administered 2024-03-04 – 2024-03-05 (×2): 54 mg via ORAL
  Filled 2024-03-04 (×2): qty 1

## 2024-03-04 MED ORDER — SODIUM CHLORIDE 0.9 % IV SOLN: INTRAVENOUS | Status: AC

## 2024-03-04 MED ORDER — AMIODARONE HCL 200 MG PO TABS
100.0000 mg | ORAL_TABLET | Freq: Every day | ORAL | Status: DC
  Administered 2024-03-04 – 2024-03-05 (×2): 100 mg via ORAL
  Filled 2024-03-04 (×2): qty 1

## 2024-03-04 NOTE — H&P (Signed)
 Patient ID: William Taylor, male   DOB: 28-Nov-1929, 89 y.o.   MRN: [id number] CC: Poor PO intake, malaise, groin pain History of Present Illness William Taylor is a 89 y.o. male with past medical history as below including coronary artery disease and history of pulmonary embolism who is on Xarelto  who presents in consultation for the above.  The patient underwent a left recurrent inguinal hernia repair on Friday with mesh.  The case was made difficult given that he had a previous hernia repair there.  He reports that over the weekend he just had pain in his left groin.  He says that he has had poor p.o. intake and has had swelling in his left scrotum.  He denies any fevers or chills and denies any drainage from his incision.  He reports that he had been urinating at home but it has been less than normal.  His wife is concerned because he did not urinate on Monday so it was recommended that he come to the ED for evaluation.  In the ED he was found to have a normal white count.  His creatinine is slightly elevated but now different than his baseline creatinine.  A CT scan was obtained which I independently reviewed.  There is a small fluid collection that was quite superficial over the incision.  This was opened by the ED and per their note there was a small amount of purulent drainage and it was packed.  He reports doing better today and has made good urine overnight.  Past Medical History Past Medical History:  Diagnosis Date   Acquired dilation of ascending aorta and aortic root (HCC)    Aortic atherosclerosis (HCC)    Atypical chest pain    BPH (benign prostatic hyperplasia)    Cerebral microvascular disease    CKD (chronic kidney disease), stage III (HCC)    Colon polyp    Compression fracture of T2 vertebra (HCC) 02/26/2023   Coronary artery disease    a.) PCI LAD (Cypher DES) in 2005; b.)  LHC 2009: no obstructive CAD and patent stent; c.) PCI OM2 12/26/2010 (2.25 x 16 mm Promus Element); d.)  LHC 10/28/2013: LAD 110m ISR, 24m, D1 90, D2 70, OM2 80 w/ patent stent;  e.) 06/2019 Low risk MV; f.) 07/2020 MV: EF 77%, no ischemia/infarct.   CVA (cerebral vascular accident) [facility])    a.) age indeterminate infarcts in the RIGHT cerebellum and LEFT parietal lobe   Depression    Dyspnea    Gall stones    GERD (gastroesophageal reflux disease)    Glaucoma    Hyperlipidemia    Hypertension    Hypertrophic nonobstructive cardiomyopathy (with severe asymmetric septal hypertrophy)    IDA (iron deficiency anemia)    Left inguinal hernia    Leg weakness    Long term current use of amiodarone     Melanoma (HCC)    Multinodular thyroid     NSTEMI (non-ST elevated myocardial infarction) (HCC) 10/26/2013   a.) experienced in setting of antithrombotic hold (clopidogrel ) for routine colonoscopy; b.) troponins trended: 1.00 --> 5.10 --> 6.20 ng/mL; c.) LHC 10/31/2013: 10/40% mLAD, 90/70% D1, 80% OM1 -- med mgmt   NSTEMI (non-ST elevated myocardial infarction) (HCC) 12/24/2010   a.) LHC 12/24/2010: 30 % LAD, 30% ISR mLAD, 50% dLAD, 95% OM2 --> d/t distal bifurcation lesion unable to see RCA --> transfer to [facility] --> PCI 12/26/2010 placing a 2.25 x 16 mm Promus Element DES to OM2   On rivaroxaban  therapy  Orthostatic hypotension    a. prev on midodrine /florinef /northera .   PAF (paroxysmal atrial fibrillation) (HCC)    a.) CHA2DS2VASc = 6 (age x2, HTN, CVA/PE x 2, prior MI/vascular disease) as of 02/24/2024; b.) rate/rhythm maintained on oral amiodarone ; chronically anticoagulated with rivaroxaban    Prostate cancer (HCC)    PSVT (paroxysmal supraventricular tachycardia) (HCC)    Pulmonary embolism (HCC) 2006   Skin cancer    Wears dentures    partial upper and lower   Wears hearing aid in both ears        Past Surgical History:  Procedure Laterality Date   CATARACT EXTRACTION Right 05/22/2011   CATARACT EXTRACTION W/PHACO Left 10/01/2023   Procedure: CATARACT EXTRACTION PHACO AND INTRAOCULAR  LENS PLACEMENT (IOC) LEFT 23.44 01:56.2;  Surgeon: Mittie Gaskin, MD;  Location: [facility];  Service: Ophthalmology;  Laterality: Left;   colonoscopy     COLONOSCOPY W/ POLYPECTOMY  08/19/2013   Dr Jeri   COLONOSCOPY WITH PROPOFOL  N/A 08/15/2016   Procedure: COLONOSCOPY WITH PROPOFOL ;  Surgeon: Ruel Kung, MD;  Location: [facility] ENDOSCOPY;  Service: Endoscopy;  Laterality: N/A;   CORONARY ANGIOPLASTY WITH STENT PLACEMENT Left 2005   Procedure: CORONARY ANGIOPLASTY WITH STENT PLACEMENT; Location: [facility])   CORONARY ANGIOPLASTY WITH STENT PLACEMENT Left 12/26/2010   Procedure: CORONARY ANGIOPLASTY WITH STENT PLACEMENT; Location: [facility]; Surgeon: Peter Swaziland, MD   ENTEROSCOPY N/A 09/22/2018   Procedure: ENTEROSCOPY;  Surgeon: Unk Corinn Skiff, MD;  Location: [facility] ENDOSCOPY;  Service: Gastroenterology;  Laterality: N/A;   ESOPHAGOGASTRODUODENOSCOPY (EGD) WITH PROPOFOL  N/A 08/15/2016   Procedure: ESOPHAGOGASTRODUODENOSCOPY (EGD) WITH PROPOFOL ;  Surgeon: Ruel Kung, MD;  Location: [facility] ENDOSCOPY;  Service: Endoscopy;  Laterality: N/A;   GIVENS CAPSULE STUDY N/A 09/26/2016   Procedure: GIVENS CAPSULE STUDY;  Surgeon: Ruel Kung, MD;  Location: [facility] ENDOSCOPY;  Service: Endoscopy;  Laterality: N/A;   HERNIA REPAIR  08/20/2011   INGUINAL HERNIA REPAIR Left 02/27/2024   Procedure: REPAIR, HERNIA, INGUINAL, ADULT, RECURRENT;  Surgeon: Marinda Jayson KIDD, MD;  Location: [facility] ORS;  Service: General;  Laterality: Left;  open procedure with mesh   LEFT HEART CATH AND CORONARY ANGIOGRAPHY N/A 04/29/2022   Procedure: LEFT HEART CATH AND CORONARY ANGIOGRAPHY;  Surgeon: Darron Deatrice LABOR, MD;  Location: [facility] INVASIVE CV LAB;  Service: Cardiovascular;  Laterality: N/A;   LEFT HEART CATH AND CORONARY ANGIOGRAPHY Left 2009   Procedure: LEFT HEART CATH AND CORONARY ANGIOGRAPHY; Location: [facility])   LEFT HEART CATH AND CORONARY ANGIOGRAPHY Left 12/24/2010    Procedure: LEFT HEART CATH AND CORONARY ANGIOGRAPHY; Locaiton: [facility]; Surgeon: [name], MD   LEFT HEART CATH AND CORONARY ANGIOGRAPHY Left 10/28/2013   Procedure: LEFT HEART CATH AND CORONARY ANGIOGRAPHY; Location: [facility]; Surgeon: [name], MD   LUMBAR SPINE SURGERY     x2 in [facility] in [phone]   UPPER GI ENDOSCOPY  04/19/2014   Dr Jeri    Allergies  Allergen Reactions   Bee Venom Swelling   Sulfasalazine Hives and Swelling   Sulfa Antibiotics Hives and Swelling   Warfarin     Chest pain   Warfarin And Related Other (See Comments)    Chest pain    Current Facility-Administered Medications  Medication Dose Route Frequency Provider Last Rate Last Admin   0.9 %  sodium chloride  infusion   Intravenous Continuous Kc, Ramesh, MD       acetaminophen  (TYLENOL ) tablet 650 mg  650 mg Oral Q6H PRN Cleatus Delayne GAILS, MD  Or   acetaminophen  (TYLENOL ) suppository 650 mg  650 mg Rectal Q6H PRN Duncan, Hazel V, MD       amoxicillin -clavulanate (AUGMENTIN ) 875-125 MG per tablet 1 tablet  1 tablet Oral Q12H Marinda Jayson KIDD, MD       bisacodyl  (DULCOLAX) EC tablet 5 mg  5 mg Oral Daily PRN Duncan, Hazel V, MD       HYDROcodone -acetaminophen  (NORCO/VICODIN) 5-325 MG per tablet 1-2 tablet  1-2 tablet Oral Q4H PRN Duncan, Hazel V, MD       morphine  (PF) 2 MG/ML injection 2 mg  2 mg Intravenous Q2H PRN Duncan, Hazel V, MD       ondansetron  (ZOFRAN ) tablet 4 mg  4 mg Oral Q6H PRN Duncan, Hazel V, MD       Or   ondansetron  (ZOFRAN ) injection 4 mg  4 mg Intravenous Q6H PRN Cleatus Delayne GAILS, MD        Family History Family History  Problem Relation Age of Onset   Stroke Mother    Colon cancer Father    Coronary artery disease Brother    Other Brother 32       lymes dz       Social History Social History   Tobacco Use   Smoking status: Never   Smokeless tobacco: Never  Vaping Use   Vaping status: Never Used  Substance Use Topics   Alcohol use: No   Drug use: No         ROS Full ROS of systems performed and is otherwise negative there than what is stated in the HPI  Physical Exam Blood pressure (!) 165/82, pulse 72, temperature 98.4 F (36.9 C), temperature source Oral, resp. rate 16, height 6' 4 (1.93 m), weight 84.4 kg, SpO2 99%.  Alert and oriented x 3, hard of hearing, normal work of breathing room air, regular rate and rhythm, abdomen is soft, nontender there is bruising along the left groin.  He does not have any evidence of recurrence of disease although there is some swelling in his left testicle.  The packing was removed and there was no purulent drainage from the wound.  There was expulsion of serosanguineous fluid.  Again there is bruising surrounding this but no evidence of spreading erythema.  Data Reviewed Labs reviewed and significant for a normal white count.  His creatinine is about at baseline.  I independently reviewed the CT scan there was a small fluid collection overlying the area of surgery and just deep to the incision that was opened.  There is some swelling in the inguinal canal but I do not see evidence of of large recurrence.  I have personally reviewed the patient's imaging and medical records.    Assessment/Plan    Patient status post left inguinal hernia repair that has had poor p.o. intake and a small fluid collection on CT scan.  This has been opened and there is drainage of serosanguineous fluid.  He has also had low urine output so is being hydrated and he made 600 cc of urine overnight.  For now we will continue to pack the wound with iodoform gauze and can be packed daily and as needed for 5 days.  I have changed him to oral antibiotics and a total of 7 days of Augmentin  will be needed for him.  Recommend removing condom cath and seeing if he can pee on his own.  Agree with PT OT consult for mobility aid.  I am hopeful that he  can be discharged later today or tomorrow.  At this time I do not think the infection extends  down to the mesh and so I will try to treat this with antibiotics and do not think that he needs a mesh explant at this time.  Continue regular diet okay for Xarelto .     Jayson MALVA Endow 03/04/2024, 9:19 AM

## 2024-03-04 NOTE — Hospital Course (Addendum)
 89 y.o. male with medical history significant for CAD s/p DES-LAD in 2005, paroxysmal atrial fibrillation on amiodarone  and Xarelto , PE, hypertension, orthostatic hypotension, PVCs, hyperlipidemia, CKD stage IIIa, headaches attributed to occipital neuralgia, who is s/p left inguinal hernia repair on 02/27/2024 admitted with surgical wound infection with cellulitis and abscess, patient complaining of decreased oral intake for solids and fluids since his surgery. In the ED vitals stable labs with chronic anemia creatinine 1.2 CT abdomen and pelvis>>subcutaneous fluid collection left groin worrisome for abscess and associated fluid and edema left inguinal canal, bilateral scrotal sac and penis indicative of cellulitis> I&D was performed in the ED, cardiology consulted and placed on antibiotic and admitted  Subjective: Seen examined Wife at bedside Hsi surgeon jsut packed the dressing  Overnight afebrile BP 160s systolic Labs pending this morning  Assessment and plan:  Surgical wound infection w/  inguinal abscess and cellulitis S/p left inguinal hernia repair on 02/27/2024: He distributed fluid collection of left groin worrisome for abscess and fluid and edema on left inguinal canal bilateral sac and pannus indicative of cellulitis I&D done in the ED, surgery has been consulted> antibiotics changed to Augmentin  by Surgery team today  Received ceftriaxone  and vancomycin  in the ED cont wound care cont per general surgery. Encourage po. Cont ivf. PTOT consulted  Poor Oral intake Since surgery, continue IV fluid hydration. RD eval  CAD S/P percutaneous coronary angioplasty Continue pravastatin , nitroglycerin , lisinopril    Paroxysmal atrial fibrillation Continue amiodarone  and Xarelto    Hypertension Stable, cont lisinopril    History of pulmonary embolism Continue Xarelto    CKD stage IIIa Renal function at baseline Recent Labs    03/05/23 0835 04/20/23 1151 01/04/24 1001 03/03/24 1305   BUN 33* 33* 24* 19  CREATININE 1.51* 1.41* 1.30* 1.28*  CO2 26 22 26 22   K 4.3 4.4 4.0 3.9

## 2024-03-04 NOTE — Evaluation (Signed)
 Occupational Therapy Evaluation Patient Details Name: William Taylor MRN: [id number] DOB: [birth year] Today's Date: 03/04/2024   History of Present Illness   89 y.o. male with medical history significant paroxysmal atrial fibrillation on amiodarone  and Xarelto , PE, hypertension, orthostatic hypotension, CKD stage IIIa. Pt is s/p left inguinal hernia repair on 02/27/2024 now admitted on 03/03/24  with surgical wound infection with cellulitis and abscess    Clinical Impressions Mr Schappell was seen for OT evaluation this date. Prior to hospital admission, pt was MOD I using SPC and RW. Pt lives with spouse in 2 level home. Pt currently requires SBA + RW for toilet t/f, SUPERVISION pericare. MIN A don underwear in sitting, assist for threading. Pt reports extensive falls hx and spouse reports she fell last night in home - recommend maximum HH services and initial 24/7 SUPERVISION on discharge. Pt would benefit from skilled OT to address noted impairments and functional limitations (see below for any additional details).     If plan is discharge home, recommend the following:   A little help with walking and/or transfers;A little help with bathing/dressing/bathroom;Help with stairs or ramp for entrance     Functional Status Assessment   Patient has had a recent decline in their functional status and demonstrates the ability to make significant improvements in function in a reasonable and predictable amount of time.     Equipment Recommendations   BSC/3in1     Recommendations for Other Services         Precautions/Restrictions   Precautions Precautions: Fall Recall of Precautions/Restrictions: Intact Restrictions Weight Bearing Restrictions Per Provider Order: No     Mobility Bed Mobility Overal bed mobility: Modified Independent             General bed mobility comments: rails, time    Transfers Overall transfer level: Needs assistance Equipment used: Rolling  walker (2 wheels) Transfers: Sit to/from Stand Sit to Stand: Contact guard assist                  Balance Overall balance assessment: Needs assistance Sitting-balance support: No upper extremity supported, Feet supported Sitting balance-Leahy Scale: Good     Standing balance support: During functional activity, Single extremity supported Standing balance-Leahy Scale: Fair                             ADL either performed or assessed with clinical judgement   ADL Overall ADL's : Needs assistance/impaired                                       General ADL Comments: SBA + RW for toilet t/f, SUPERVISION pericare. MIN A don underwear in sitting, assist for threading     Vision         Perception         Praxis         Pertinent Vitals/Pain Pain Assessment Pain Assessment: No/denies pain     Extremity/Trunk Assessment Upper Extremity Assessment Upper Extremity Assessment: Overall WFL for tasks assessed   Lower Extremity Assessment Lower Extremity Assessment: Generalized weakness       Communication Communication Communication: Impaired Factors Affecting Communication: Hearing impaired   Cognition Arousal: Alert Behavior During Therapy: WFL for tasks assessed/performed Cognition: No apparent impairments  Following commands: Intact       Cueing  General Comments          Exercises     Shoulder Instructions      Home Living Family/patient expects to be discharged to:: Private residence Living Arrangements: Spouse/significant other   Type of Home: House Home Access: Stairs to enter [employer] of Steps: 1+1   Home Layout: Two level               Home Equipment: Agricultural consultant (2 wheels);Cane - single point          Prior Functioning/Environment Prior Level of Function : Independent/Modified Independent;History of Falls (last six months)              Mobility Comments: SPC inside, RW outside ADLs Comments: wife does cooking    OT Problem List: Decreased strength;Decreased range of motion;Decreased activity tolerance;Impaired balance (sitting and/or standing)   OT Treatment/Interventions: Self-care/ADL training;Therapeutic exercise;Energy conservation;DME and/or AE instruction;Therapeutic activities      OT Goals(Current goals can be found in the care plan section)   Acute Rehab OT Goals Patient Stated Goal: to go home OT Goal Formulation: With patient/family Time For Goal Achievement: 03/18/24 Potential to Achieve Goals: Good ADL Goals Pt Will Perform Grooming: with modified independence;standing Pt Will Perform Lower Body Dressing: with modified independence;sit to/from stand Pt Will Transfer to Toilet: with modified independence;ambulating;regular height toilet   OT Frequency:  Min 3X/week    Co-evaluation              AM-PAC OT 6 Clicks Daily Activity     Outcome Measure Help from another person eating meals?: None Help from another person taking care of personal grooming?: A Little Help from another person toileting, which includes using toliet, bedpan, or urinal?: A Little Help from another person bathing (including washing, rinsing, drying)?: A Little Help from another person to put on and taking off regular upper body clothing?: None Help from another person to put on and taking off regular lower body clothing?: A Little 6 Click Score: 20   End of Session Equipment Utilized During Treatment: Rolling walker (2 wheels) Nurse Communication: Mobility status  Activity Tolerance: Patient tolerated treatment well Patient left: in chair;with call bell/phone within reach;with chair alarm set;with family/visitor present  OT Visit Diagnosis: Other abnormalities of gait and mobility (R26.89);Muscle weakness (generalized) (M62.81)                Time: 1020-1047 OT Time Calculation (min): 27 min Charges:  OT  General Charges $OT Visit: 1 Visit OT Evaluation $OT Eval Moderate Complexity: 1 Mod OT Treatments $Self Care/Home Management : 8-22 mins  Elston Slot, M.S. OTR/L  03/04/24, 11:45 AM  ascom [phone]

## 2024-03-04 NOTE — Plan of Care (Signed)
  Problem: Education: Goal: Knowledge of General Education information will improve Description: Including pain rating scale, medication(s)/side effects and non-pharmacologic comfort measures Outcome: Progressing   Problem: Health Behavior/Discharge Planning: Goal: Ability to manage health-related needs will improve Outcome: Progressing   Problem: Clinical Measurements: Goal: Ability to maintain clinical measurements within normal limits will improve Outcome: Progressing Goal: Diagnostic test results will improve Outcome: Progressing Goal: Respiratory complications will improve Outcome: Progressing   Problem: Activity: Goal: Risk for activity intolerance will decrease Outcome: Progressing   Problem: Nutrition: Goal: Adequate nutrition will be maintained Outcome: Progressing   

## 2024-03-04 NOTE — Progress Notes (Signed)
 Initial Nutrition Assessment  DOCUMENTATION CODES:   Severe malnutrition in context of chronic illness  INTERVENTION:   Boost Breeze po BID, each supplement provides 250 kcal and 9 grams of protein  [employer] chocolate nutrition shake po daily- each supplement provides 330kcal and 16g protein  Magic cup TID with meals, each supplement provides 290 kcal and 9 grams of protein  Liberalize diet   Pt at high refeed risk; recommend monitor potassium, magnesium  and phosphorus labs daily until stable  Daily weights   NUTRITION DIAGNOSIS:   Severe Malnutrition related to chronic illness as evidenced by severe muscle depletion, severe fat depletion.  GOAL:   Patient will meet greater than or equal to 90% of their needs  MONITOR:   PO intake, Supplement acceptance, Labs, Weight trends, Skin, I & O's  REASON FOR ASSESSMENT:   Consult Wound healing  ASSESSMENT:   89 y/o male with h/o HLD, HTN, BPH, prostate cancer, Afib, IDA, GERD, MI, gout, diverticulosis, compression fracture, CKD III, PSVT, CAD, L inguinal hernia s/p repair 2013 and recent open repair of left recurrent inguinal hernia with mesh 7/11 who is admitted with surgical wound infection.  Met with pt in room today. Pt sitting up in the chair eating at the time of RD visit. Pt is known to this RD from a previous admission. Pt reports decreased appetite and oral intake for several years but reports that he has not eaten much since his surgery on 7/11. Pt reports that he is hungry today; pt ended up eating 90% of his lunch. RD discussed with pt the importance of adequate nutrition needed to preserve lean muscle and support wound healing. Pt is reluctant to drink supplements as he reports diarrhea with Ensure but is agreeable to try Boost Breeze and [employer]. Pt reports that he loves ice cream; RD will add Magic Cups to meal trays. Pt unable to get certain foods he wants; RD will liberalize diet. Pt is at refeed risk. No new  weight this admission. Pt does appear weight stable at baseline.   Medications reviewed and include: amoxicillin , D3, protonix   Labs reviewed: K 3.9 wnl, creat 1.28(H)- 7/16 Hgb 9.8(L), Hct 30.2(L)- 7/16  NUTRITION - FOCUSED PHYSICAL EXAM:  Flowsheet Row Most Recent Value  Orbital Region Moderate depletion  Upper Arm Region Severe depletion  Thoracic and Lumbar Region Severe depletion  Buccal Region Moderate depletion  Temple Region Moderate depletion  Clavicle Bone Region Severe depletion  Clavicle and Acromion Bone Region Severe depletion  Scapular Bone Region Moderate depletion  Dorsal Hand Severe depletion  Patellar Region Severe depletion  Anterior Thigh Region Severe depletion  Posterior Calf Region Severe depletion  Edema (RD Assessment) None  Hair Reviewed  Eyes Reviewed  Mouth Reviewed  Skin Reviewed  Nails Reviewed   Diet Order:   Diet Order             Diet regular Room service appropriate? Yes; Fluid consistency: Thin  Diet effective now                  EDUCATION NEEDS:   Education needs have been addressed  Skin:  Skin Assessment: Reviewed RN Assessment (surgical wound L groin)  Last BM:  7/15  Height:   Ht Readings from Last 1 Encounters:  03/04/24 6' 4 (1.93 m)    Weight:   Wt Readings from Last 1 Encounters:  03/03/24 84.4 kg    BMI:  Body mass index is 22.65 kg/m.  Estimated Nutritional Needs:  Kcal:  2100-2400kcal/day  Protein:  105-120g/day  Fluid:  2.1-2.4L/day  Augustin Shams MS, RD, LDN If unable to be reached, please send secure chat to RD inpatient available from 8:00a-4:00p daily

## 2024-03-04 NOTE — Progress Notes (Signed)
 PROGRESS NOTE William Taylor  FMW:[id number] DOB: 1929/09/23 DOA: 03/03/2024 PCP: [name], MD  Brief Narrative/Hospital Course: 89 y.o. male with medical history significant for CAD s/p DES-LAD in 2005, paroxysmal atrial fibrillation on amiodarone  and Xarelto , PE, hypertension, orthostatic hypotension, PVCs, hyperlipidemia, CKD stage IIIa, headaches attributed to occipital neuralgia, who is s/p left inguinal hernia repair on 02/27/2024 admitted with surgical wound infection with cellulitis and abscess, patient complaining of decreased oral intake for solids and fluids since his surgery. In the ED vitals stable labs with chronic anemia creatinine 1.2 CT abdomen and pelvis>>subcutaneous fluid collection left groin worrisome for abscess and associated fluid and edema left inguinal canal, bilateral scrotal sac and penis indicative of cellulitis> I&D was performed in the ED, cardiology consulted and placed on antibiotic and admitted  Subjective: Seen examined Wife at bedside Hsi surgeon jsut packed the dressing  Overnight afebrile BP 160s systolic Labs pending this morning  Assessment and plan:  Surgical wound infection w/  inguinal abscess and cellulitis S/p left inguinal hernia repair on 02/27/2024: He distributed fluid collection of left groin worrisome for abscess and fluid and edema on left inguinal canal bilateral sac and pannus indicative of cellulitis I&D done in the ED, surgery has been consulted> antibiotics changed to Augmentin  by Surgery team today  Received ceftriaxone  and vancomycin  in the ED cont wound care cont per general surgery. Encourage po. Cont ivf. PTOT consulted  Poor Oral intake Since surgery, continue IV fluid hydration. RD eval  CAD S/P percutaneous coronary angioplasty Continue pravastatin , nitroglycerin , lisinopril    Paroxysmal atrial fibrillation Continue amiodarone  and Xarelto    Hypertension Stable, cont lisinopril    History of pulmonary  embolism Continue Xarelto    CKD stage IIIa Renal function at baseline Recent Labs    03/05/23 0835 04/20/23 1151 01/04/24 1001 03/03/24 1305  BUN 33* 33* 24* 19  CREATININE 1.51* 1.41* 1.30* 1.28*  CO2 26 22 26 22   K 4.3 4.4 4.0 3.9     DVT prophylaxis: XARELTO  resume- discussed w/ Surgery Code Status:   Code Status: Full Code Family Communication: plan of care discussed with patient/wife  at bedside. Patient status is: Remains hospitalized because of severity of illness Level of care: Med-Surg   Dispo: The patient is from: home w/ wife            Anticipated disposition: home tomorrow Objective Vitals last 24 hrs: Vitals:   03/03/24 2240 03/04/24 0108 03/04/24 0356 03/04/24 0817  BP:  (!) 163/88 (!) 167/97 (!) 165/82  Pulse:  72 74 72  Resp:  16 16 16   Temp: (!) 97.1 F (36.2 C) 98.6 F (37 C) 98.4 F (36.9 C)   TempSrc: Oral Oral Oral   SpO2:  99% 98% 99%  Weight:      Height:  6' 4 (1.93 m)      Physical Examination: General exam: alert awake, oriented at baseline, older than stated age HEENT:Oral mucosa moist, Ear/Nose WNL grossly Respiratory system: Bilaterally clear BS,no use of accessory muscle Cardiovascular system: S1 & S2 +, No JVD. Gastrointestinal system: Abdomen soft,NT,ND, BS+ Nervous System: Alert, awake, moving all extremities,and following commands. Extremities: lt gorin surgical site w/ packing placed just now- did not remove, had drainage coming out Skin: No rashes,no icterus. MSK: Normal muscle bulk,tone, power   Data Reviewed: I have personally reviewed following labs and imaging studies ( see epic result tab) CBC: Recent Labs  Lab 03/03/24 1305  WBC 9.8  HGB 9.8*  HCT 30.2*  MCV  102.0*  PLT 256   CMP: Recent Labs  Lab 03/03/24 1305  NA 135  K 3.9  CL 105  CO2 22  GLUCOSE 175*  BUN 19  CREATININE 1.28*  CALCIUM 9.5   GFR: Estimated Creatinine Clearance: 44 mL/min (A) (by C-G formula based on SCr of 1.28 mg/dL (H)). No  results for input(s): AST, ALT, ALKPHOS, BILITOT, PROT, ALBUMIN in the last 168 hours. No results for input(s): LIPASE, AMYLASE in the last 168 hours. No results for input(s): AMMONIA in the last 168 hours. Coagulation Profile: No results for input(s): INR, PROTIME in the last 168 hours. Unresulted Labs (From admission, onward)    None      Antimicrobials/Microbiology: Anti-infectives (From admission, onward)    Start     Dose/Rate Route Frequency Ordered Stop   03/04/24 1000  amoxicillin -clavulanate (AUGMENTIN ) 875-125 MG per tablet 1 tablet        1 tablet Oral Every 12 hours 03/04/24 0835     03/03/24 2000  vancomycin  (VANCOREADY) IVPB 1250 mg/250 mL        1,250 mg 166.7 mL/hr over 90 Minutes Intravenous STAT 03/03/24 1932 03/04/24 0013   03/03/24 1945  cefTRIAXone  (ROCEPHIN ) 2 g in sodium chloride  0.9 % 100 mL IVPB        2 g 200 mL/hr over 30 Minutes Intravenous  Once 03/03/24 1932 03/03/24 2141         Component Value Date/Time   SDES BLOOD BLOOD RIGHT HAND 07/24/2022 2343   SDES  07/24/2022 2343    BLOOD BLOOD RIGHT FOREARM Performed at [facility], [address], [place], [place] [address]    [facility] Blood Culture adequate volume 07/24/2022 2343   SPECREQUEST  07/24/2022 2343    Blood Culture adequate volume Performed at [facility], [address] [place], [place] [address]    CULT  07/24/2022 2343    NO GROWTH 5 DAYS Performed at [facility], [address] [place], [place] [address]    CULT  07/24/2022 2343    NO GROWTH 6 DAYS Performed at [facility], [address], [place], [place] [address]    REPTSTATUS 07/29/2022 FINAL 07/24/2022 2343   REPTSTATUS 07/31/2022 FINAL 07/24/2022 2343    Procedures:  Medications reviewed:  Scheduled Meds:  amiodarone   100 mg Oral Daily   amoxicillin -clavulanate  1 tablet Oral Q12H   cholecalciferol   1,000 Units Oral Weekly   fenofibrate   54 mg Oral  Daily   finasteride   5 mg Oral Daily   levobunolol   1 drop Right Eye BID   lisinopril   5 mg Oral Daily   loratadine   10 mg Oral Daily   pantoprazole   40 mg Oral Daily   pravastatin   40 mg Oral QHS   Rivaroxaban   15 mg Oral Q supper   Continuous Infusions:  sodium chloride       Mennie LAMY, MD [facility] 03/04/2024, 9:32 AM

## 2024-03-04 NOTE — Evaluation (Signed)
 Physical Therapy Evaluation Patient Details Name: William Taylor MRN: [id number] DOB: 06-29-1930 Today's Date: 03/04/2024  History of Present Illness  89 y.o. male with medical history significant paroxysmal atrial fibrillation on amiodarone  and Xarelto , PE, hypertension, orthostatic hypotension, CKD stage IIIa. Pt is s/p left inguinal hernia repair on 02/27/2024 now admitted on 03/03/24  with surgical wound infection with cellulitis and abscess  Clinical Impression  Pt admitted with above diagnosis. Pt currently with functional limitations due to the deficits listed below (see PT Problem List). Pt received supine in bed agreeable to PT services. PTA was mod-I with RW and SPC in household. Indep with ADL's. Spouse assists with cooking.   To date, pt needs minA for bed mobility with increased time. Soreness in L groin limits mobility. STS at [facility] to RW completing ~100' at supervision with step through gait. Besides L groin soreness reports close to his baseline with his gait mechanics and AD use. Pt returns to EoB and supine at supervision with all needs in reach. Due to hx of falls rec d/c recs to ensure safe transition to previous environment.      If plan is discharge home, recommend the following: Assistance with cooking/housework;Assist for transportation;Help with stairs or ramp for entrance;A little help with walking and/or transfers   Can travel by private vehicle        Equipment Recommendations None recommended by PT  Recommendations for Other Services       Functional Status Assessment Patient has had a recent decline in their functional status and demonstrates the ability to make significant improvements in function in a reasonable and predictable amount of time.     Precautions / Restrictions Precautions Precautions: Fall Recall of Precautions/Restrictions: Intact Restrictions Weight Bearing Restrictions Per Provider Order: No      Mobility  Bed Mobility Overal bed  mobility: Needs Assistance Bed Mobility: Supine to Sit     Supine to sit: Min assist, HOB elevated, Used rails     General bed mobility comments: minA, increased time Patient Response: Cooperative  Transfers Overall transfer level: Needs assistance Equipment used: Rolling walker (2 wheels) Transfers: Sit to/from Stand Sit to Stand: Contact guard assist           General transfer comment: bouts of momentum    Ambulation/Gait Ambulation/Gait assistance: Supervision Gait Distance (Feet): 100 Feet Assistive device: Rolling walker (2 wheels) Gait Pattern/deviations: Step-through pattern          Stairs            Wheelchair Mobility     Tilt Bed Tilt Bed Patient Response: Cooperative  Modified Rankin (Stroke Patients Only)       Balance Overall balance assessment: Needs assistance Sitting-balance support: No upper extremity supported, Feet supported Sitting balance-Leahy Scale: Good     Standing balance support: During functional activity, Single extremity supported Standing balance-Leahy Scale: Fair                               Pertinent Vitals/Pain Pain Assessment Pain Assessment: Faces Faces Pain Scale: Hurts a little bit Pain Location: L groin Pain Descriptors / Indicators: Discomfort, Aching Pain Intervention(s): Limited activity within patient's tolerance, Monitored during session    Home Living Family/patient expects to be discharged to:: Private residence   Available Help at Discharge: Family;Available 24 hours/day Type of Home: House Home Access: Level entry       Home Layout: One level Home Equipment: Rolling  Walker (2 wheels);Cane - single point      Prior Function Prior Level of Function : Independent/Modified Independent;History of Falls (last six months)             Mobility Comments: SPC inside, RW outside ADLs Comments: wife does cooking     Extremity/Trunk Assessment   Upper Extremity  Assessment Upper Extremity Assessment: Overall WFL for tasks assessed    Lower Extremity Assessment Lower Extremity Assessment: Generalized weakness    Cervical / Trunk Assessment Cervical / Trunk Assessment: Kyphotic  Communication   Communication Communication: Impaired Factors Affecting Communication: Hearing impaired    Cognition Arousal: Alert Behavior During Therapy: WFL for tasks assessed/performed   PT - Cognitive impairments: No apparent impairments                         Following commands: Intact       Cueing Cueing Techniques: Verbal cues     General Comments      Exercises     Assessment/Plan    PT Assessment Patient needs continued PT services  PT Problem List Decreased strength;Decreased balance;Decreased mobility;Decreased activity tolerance;Decreased safety awareness       PT Treatment Interventions DME instruction;Therapeutic activities;Gait training;Therapeutic exercise;Patient/family education;Balance training;Functional mobility training;Neuromuscular re-education    PT Goals (Current goals can be found in the Care Plan section)  Acute Rehab PT Goals Patient Stated Goal: to go home PT Goal Formulation: With patient Time For Goal Achievement: 03/18/24 Potential to Achieve Goals: Good    Frequency Min 2X/week     Co-evaluation               AM-PAC PT 6 Clicks Mobility  Outcome Measure Help needed turning from your back to your side while in a flat bed without using bedrails?: A Little Help needed moving from lying on your back to sitting on the side of a flat bed without using bedrails?: A Little Help needed moving to and from a bed to a chair (including a wheelchair)?: A Little Help needed standing up from a chair using your arms (e.g., wheelchair or bedside chair)?: A Little Help needed to walk in hospital room?: A Little Help needed climbing 3-5 steps with a railing? : A Lot 6 Click Score: 17    End of Session  Equipment Utilized During Treatment: Gait belt Activity Tolerance: Patient tolerated treatment well Patient left: in bed;with call bell/phone within reach;with bed alarm set Nurse Communication: Mobility status PT Visit Diagnosis: Unsteadiness on feet (R26.81);Muscle weakness (generalized) (M62.81);Difficulty in walking, not elsewhere classified (R26.2);History of falling (Z91.81)    Time: 8661-8640 PT Time Calculation (min) (ACUTE ONLY): 21 min   Charges:   PT Evaluation $PT Eval Low Complexity: 1 Low   PT General Charges $$ ACUTE PT VISIT: 1 Visit        Dorina HERO. Fairly IV, PT, DPT Physical Therapist- [place]  [facility] 03/04/2024, 3:49 PM

## 2024-03-05 ENCOUNTER — Other Ambulatory Visit: Payer: Self-pay

## 2024-03-05 DIAGNOSIS — T8149XA Infection following a procedure, other surgical site, initial encounter: Secondary | ICD-10-CM | POA: Diagnosis not present

## 2024-03-05 DIAGNOSIS — E43 Unspecified severe protein-calorie malnutrition: Secondary | ICD-10-CM | POA: Insufficient documentation

## 2024-03-05 LAB — BASIC METABOLIC PANEL WITH GFR
Anion gap: 6 (ref 5–15)
BUN: 16 mg/dL (ref 8–23)
CO2: 24 mmol/L (ref 22–32)
Calcium: 9.1 mg/dL (ref 8.9–10.3)
Chloride: 108 mmol/L (ref 98–111)
Creatinine, Ser: 1.05 mg/dL (ref 0.61–1.24)
GFR, Estimated: >60 mL/min (ref >60)
Glucose, Bld: 94 mg/dL (ref 70–99)
Potassium: 4.8 mmol/L (ref 3.5–5.1)
Sodium: 138 mmol/L (ref 135–145)

## 2024-03-05 LAB — PHOSPHORUS: Phosphorus: 3.2 mg/dL (ref 2.5–4.6)

## 2024-03-05 LAB — MAGNESIUM: Magnesium: 1.6 mg/dL — ABNORMAL LOW (ref 1.7–2.4)

## 2024-03-05 MED ORDER — AMOXICILLIN-POT CLAVULANATE 875-125 MG PO TABS
1.0000 | ORAL_TABLET | Freq: Two times a day (BID) | ORAL | 0 refills | Status: AC
  Filled 2024-03-05: qty 14, 7d supply, fill #0

## 2024-03-05 NOTE — Discharge Summary (Signed)
 William Taylor FMW:[id number] DOB: 13-Apr-1930 DOA: 03/03/2024  PCP: [name], MD  Admit date: 03/03/2024 Discharge date: 03/05/2024  Time spent: 35 minutes  Recommendations for Outpatient Follow-up:  Gen surg f/u 7/25     Discharge Diagnoses:  Principal Problem:   Surgical wound infection,  inguinal abscess and cellulitis Active Problems:   H/O left inguinal hernia repair 02/27/2024   CAD S/P percutaneous coronary angioplasty   Inadequate oral intake   Paroxysmal atrial fibrillation (HCC)   Hypertension   History of pulmonary embolism   CKD (chronic kidney disease), stage IIIa   Protein-calorie malnutrition, severe   Discharge Condition: improved  Diet recommendation: heart healthy  Filed Weights   03/03/24 1303  Weight: 84.4 kg    History of present illness:  From admission h and p William Taylor is a 89 y.o. male with medical history significant for CAD s/p DES-LAD in 2005, paroxysmal atrial fibrillation on amiodarone  and Xarelto , PE, hypertension, orthostatic hypotension, PVCs, hyperlipidemia, CKD stage IIIa, headaches attributed to occipital neuralgia, who is s/p left inguinal hernia repair 5 days ago on 02/27/2024, being admitted with surgical wound infection with cellulitis and abscess, undergoing I&D in the ED after initially presenting with concern for poor urinary output since her surgical procedure.  Patient reports decreased oral intake of both solids and fluids since his surgery and in the past 24 hours noticed that he had very little urinary output.  He denied nausea, vomiting, fever, abdominal pain or change in bowel habits. In the ED, vitals were within normal limits.  Labs notable for chronic stable anemia of 9.8, stable creatinine of 1.2 with, normal urinalysis. CT abdomen and pelvis showed a subcutaneous fluid collection left groin worrisome for abscess and associated fluid and edema left inguinal canal, bilateral scrotal sac and penis indicative of  cellulitis.  (Please see full report. I&D performed in the ED Patient started on Rocephin  and vancomycin  The ED provider spoke with on-call surgeon, Dr. Lane who requested hospitalist admission.   Hospital Course:  Post op from open left inguinal hernia repair with mesh on 7/11 returns with superficial infection and drainage, small fluid collection on CT. Gen surg consulted, removed packing and explored the wound. No cultures obtained. They do not think there is a deep infection. Advised daily packing through  7/22 with iodinated gauze and 7 days augmentin  with f/u in one week on 7/25. A family friend who is a Engineer, civil (consulting) will perform that duty. Pt/ot advising home health, patient declines, though home health rn ordered for monitoring this wound.   Procedures: none   Consultations: Gen surg  Discharge Exam: Vitals:   03/05/24 0417 03/05/24 0941  BP: (!) 153/82 (!) 151/80  Pulse: 67 78  Resp: 17 18  Temp: 98.7 F (37.1 C) 98.2 F (36.8 C)  SpO2: 96% 99%    General: NAD Cardiovascular: RRR Respiratory: CTAB Skin: surgical incision that is packed left groin with some surrounding induration and erythema  Discharge Instructions   Discharge Instructions     Diet - low sodium heart healthy   Complete by: As directed    Discharge wound care:   Complete by: As directed    Pack with 1/4 inch iodinated packing once daily and cover with 4x4 dressing through 7/22, then apply superficial dressing and change daily   Increase activity slowly   Complete by: As directed       Allergies as of 03/05/2024       Reactions   Bee Venom  Swelling   Sulfasalazine Hives, Swelling   Sulfa Antibiotics Hives, Swelling   Warfarin    Chest pain   Warfarin And Related Other (See Comments)   Chest pain        Medication List     TAKE these medications    acetaminophen  500 MG tablet Commonly known as: TYLENOL  Take 500 mg by mouth every 6 (six) hours as needed for moderate pain (pain  score 4-6).   amiodarone  100 MG tablet Commonly known as: PACERONE  Take 1 tablet (100 mg total) by mouth daily.   amoxicillin -clavulanate 875-125 MG tablet Commonly known as: AUGMENTIN  Take 1 tablet by mouth every 12 (twelve) hours for 7 days.   cholecalciferol  25 MCG (1000 UNIT) tablet Commonly known as: VITAMIN D3 Take 1,000 Units by mouth once a week.   fenofibrate  micronized 130 MG capsule Commonly known as: ANTARA  TAKE 1 CAPSULE BY MOUTH DAILY  BEFORE BREAKFAST   finasteride  5 MG tablet Commonly known as: PROSCAR  TAKE 1 TABLET BY MOUTH DAILY   levobunolol  0.5 % ophthalmic solution Commonly known as: BETAGAN  Place 1 drop into the right eye 2 (two) times daily.   lisinopril  5 MG tablet Commonly known as: ZESTRIL  Take 1 tablet (5 mg total) by mouth daily.   loratadine  10 MG tablet Commonly known as: CLARITIN  Take 1 tablet (10 mg total) by mouth daily.   MULTIVITAMIN PO Take 1 tablet by mouth daily.   nitroGLYCERIN  0.4 MG SL tablet Commonly known as: NITROSTAT  DISSOLVE UNDER THE TONGUE 1 TABLET EVERY 5 MINUTES AS NEEDED FOR CHEST PAIN   oxyCODONE  5 MG immediate release tablet Commonly known as: Oxy IR/ROXICODONE  Take 1 tablet (5 mg total) by mouth every 6 (six) hours as needed for severe pain (pain score 7-10).   pantoprazole  40 MG tablet Commonly known as: PROTONIX  TAKE ONE (1) TABLET BY MOUTH TWO TIMES PER DAY   pravastatin  40 MG tablet Commonly known as: PRAVACHOL  Take 1 tablet (40 mg total) by mouth at bedtime.   Rivaroxaban  15 MG Tabs tablet Commonly known as: Xarelto  Take 1 tablet (15 mg total) by mouth daily.               Discharge Care Instructions  (From admission, onward)           Start     Ordered   03/05/24 0000  Discharge wound care:       Comments: Pack with 1/4 inch iodinated packing once daily and cover with 4x4 dressing through 7/22, then apply superficial dressing and change daily   03/05/24 1108            Allergies  Allergen Reactions   Bee Venom Swelling   Sulfasalazine Hives and Swelling   Sulfa Antibiotics Hives and Swelling   Warfarin     Chest pain   Warfarin And Related Other (See Comments)    Chest pain    Follow-up Information     Marinda Jayson KIDD, MD Follow up on 03/11/2024.   Specialty: General Surgery Why: Appointment scheduled for 03/11/24 at 9:45 AM.  Please arrive 15 min early. Contact information: [address] [place] [address] [phone]                  The results of significant diagnostics from this hospitalization (including imaging, microbiology, ancillary and laboratory) are listed below for reference.    Significant Diagnostic Studies: CT ABDOMEN PELVIS W CONTRAST Result Date: 03/03/2024 CLINICAL DATA:  Postop abdominal pain. Recent hernia  repair. Difficulty urinating. Testicular swelling. EXAM: CT ABDOMEN AND PELVIS WITH CONTRAST TECHNIQUE: Multidetector CT imaging of the abdomen and pelvis was performed using the standard protocol following bolus administration of intravenous contrast. RADIATION DOSE REDUCTION: This exam was performed according to the departmental dose-optimization program which includes automated exposure control, adjustment of the mA and/or kV according to patient size and/or use of iterative reconstruction technique. CONTRAST:  OMNIPAQUE  IOHEXOL  300 MG/ML  SOLN COMPARISON:  11/09/2021. FINDINGS: Lower chest: No acute findings. Atherosclerotic calcification of the aorta, aortic valve and coronary arteries. Heart is at the upper limits of normal in size. No pericardial or pleural effusion. Distal esophagus is grossly unremarkable. Hepatobiliary: Probable tiny cysts in the liver. No specific follow-up necessary. Gallstones. No biliary ductal dilatation. Pancreas: Negative. Spleen: Negative. Adrenals/Urinary Tract: Adrenal glands are unremarkable. Renal cortical thinning bilaterally. Small low-attenuation lesion in the  right kidney. No specific follow-up necessary. Ureters are decompressed. Bladder is grossly unremarkable. Stomach/Bowel: Gastric wall thickening. Stomach, small bowel, appendix and colon are otherwise unremarkable. Vascular/Lymphatic: Atherosclerotic calcification of the aorta. No pathologically enlarged lymph nodes. Reproductive: Prostate is visualized. Soft tissue edema and fluid along the penis and scrotum. Other: Soft tissue haziness superior to the left inguinal canal, extending into the left inguinal canal and along the bilateral scrotum and penis. Overlying subcutaneous edema. A fluid collection with internal air is seen in the left groin, measuring 2.4 x 5.3 cm (2/93). Otherwise, no additional soft tissue gas. No free fluid. Mesenteries and peritoneum are unremarkable. Musculoskeletal: Osteopenia.  Degenerative changes in the spine. IMPRESSION: 1. Subcutaneous collection of fluid and air in the left groin, worrisome for an abscess. Associated fluid and edema in the left inguinal canal, bilateral scrotal sac and penis, indicative of cellulitis. No additional soft tissue gas to suggest gangrene. 2. Cholelithiasis. 3. Gastric wall thickening. 4. Aortic atherosclerosis (ICD10-I70.0). Coronary artery calcification. Electronically Signed   By: Newell Eke M.D.   On: 03/03/2024 17:55    Microbiology: No results found for this or any previous visit (from the past 240 hours).   Labs: Basic Metabolic Panel: Recent Labs  Lab 03/03/24 1305 03/05/24 0458  NA 135 138  K 3.9 4.8  CL 105 108  CO2 22 24  GLUCOSE 175* 94  BUN 19 16  CREATININE 1.28* 1.05  CALCIUM 9.5 9.1  MG  --  1.6*  PHOS  --  3.2   Liver Function Tests: No results for input(s): AST, ALT, ALKPHOS, BILITOT, PROT, ALBUMIN in the last 168 hours. No results for input(s): LIPASE, AMYLASE in the last 168 hours. No results for input(s): AMMONIA in the last 168 hours. CBC: Recent Labs  Lab 03/03/24 1305  WBC 9.8   HGB 9.8*  HCT 30.2*  MCV 102.0*  PLT 256   Cardiac Enzymes: No results for input(s): CKTOTAL, CKMB, CKMBINDEX, TROPONINI in the last 168 hours. BNP: BNP (last 3 results) No results for input(s): BNP in the last 8760 hours.  ProBNP (last 3 results) No results for input(s): PROBNP in the last 8760 hours.  CBG: No results for input(s): GLUCAP in the last 168 hours.     Signed:  Devaughn KATHEE Ban MD.  [facility] 03/05/2024, 11:09 AM

## 2024-03-05 NOTE — Discharge Instructions (Signed)
 Wound dressing instruction:  Please continue packing left groin wound with 1/4 inch iodoform gauze once daily.  Cover with 4x4 gauze and secure with tape.  Outer dressing may be changed as needed during the day if the gauze is getting saturated, but the packing gauze can be left for once daily.  Could time your packing dressing change with your shower so you can get a new clean dressing after you shower. Can stop packing the wound starting on 03/09/24, and would only need outer 4x4 gauze dressing until the skin wound is fully healed.  Continue taking your antibiotic until course completed. The swelling and bruising in the groin area will continue to improve as the inflammation from the surgery and infection continue to resolve.

## 2024-03-05 NOTE — TOC Initial Note (Signed)
 Transition of Care [facility]) - Initial/Assessment Note    Patient Details  Name: William Taylor MRN: [id number] Date of Birth: 10/31/1929  [facility] CM/SW Contact:    Corean ONEIDA Haddock, RN Phone Number: 03/05/2024, 11:23 AM  Clinical Narrative:                  Met with patient and wife at bedside to discuss discharge  Admitted for: s/p hernia repair Admitted from: home with wife PCP: Cleatus  Current home health/prior home health/DME:RW and cane  Patient will require daily dressing changes through 7/22.  Unable to secure daily HH visits.  Church Member Jerel Cashing who is an to come to room prior to discharge for dressing change education, and will be providing daily dressing changes  Patient agreeable to home health RN.  Declines PT and OT.  Patient and wife state they do not have a preference of agency.  Referral made to [facility] with [facility]    Expected Discharge Plan: Home w Home Health Services Barriers to Discharge: Barriers Resolved   Patient Goals and CMS Choice   CMS Medicare.gov Compare Post Acute Care list provided to:: Patient        Expected Discharge Plan and Services     Post Acute Care Choice: Home Health   Expected Discharge Date: 03/05/24                         HH Arranged: RN HH Agency: [facility] Date [facility] Agency Contacted: 03/05/24   Representative spoke with at [facility] Agency: [place]  Prior Living Arrangements/Services   Lives with:: Spouse   Do you feel safe going back to the place where you live?: Yes          Current home services: DME    Activities of Daily Living   ADL Screening (condition at time of admission) Independently performs ADLs?: Yes (appropriate for developmental age) Is the patient deaf or have difficulty hearing?: Yes Does the patient have difficulty seeing, even when wearing glasses/contacts?: No Does the patient have difficulty concentrating, remembering, or making decisions?:  No  Permission Sought/Granted                  Emotional Assessment       Orientation: : Oriented to Self, Oriented to Place, Oriented to  Time, Oriented to Situation      Admission diagnosis:  Surgical wound infection [T81.49XA] Abdominal wall abscess at site of surgical wound [T81.49XA] Patient Active Problem List   Diagnosis Date Noted   Protein-calorie malnutrition, severe 03/05/2024   Surgical wound infection,  inguinal abscess and cellulitis 03/03/2024   H/O left inguinal hernia repair 02/27/2024 03/03/2024   Inadequate oral intake 03/03/2024   Occipital neuralgia 01/12/2024   Advance care planning 07/17/2023   Melanoma of skin (HCC) 07/17/2023   Balance problem 04/30/2023   Compression fracture of T2 vertebra (HCC) 03/05/2023   Cerebral infarction, chronic 03/05/2023   Thyroid  nodule 03/05/2023   Olecranon bursitis, right elbow 01/31/2023   Edema of lower extremity 10/10/2022   Chronic constipation 08/06/2022   PVC's (premature ventricular contractions) 05/07/2022   Neuropathic pain 04/04/2022   Spondylolisthesis, lumbar region 03/14/2022   Hypophosphatemia 09/02/2021   Allergic rhinitis 08/30/2020   CAD S/P percutaneous coronary angioplasty    GERD (gastroesophageal reflux disease)    Benign prostatic hyperplasia with lower urinary tract symptoms 08/13/2020   Fall 11/11/2019   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   Osteoarthritis of knees, bilateral 08/31/2019   Orthostatic hypotension 06/06/2018   Chronic pain of left knee 06/05/2018   Chronic back pain 02/17/2018   Angiodysplasia of intestinal tract    Iron deficiency anemia    Diverticulosis of large intestine without diverticulitis    Gastric polyp    Left rotator cuff tear arthropathy 09/11/2015   Fatigue 04/15/2014   Abdominal pain 01/18/2014   Prostate cancer (HCC) 03/15/2013   DOE (dyspnea on exertion) 11/23/2012   Insomnia 09/03/2012   Hypertension 07/23/2012   CKD (chronic kidney  disease), stage IIIa 07/23/2011   History of pulmonary embolism 01/09/2011   Paroxysmal atrial fibrillation (HCC) 09/06/2010   Mixed hyperlipidemia 05/19/2009   PCP:  [name], MD Pharmacy:   [facility] - [place], [place] - [address] [place] [address] Phone: [phone] Fax: [phone]  [facility] [address] [place] [address] Phone: [phone] Fax: [phone]     Social Drivers of Health (SDOH) Social History: SDOH Screenings   Food Insecurity: No Food Insecurity (03/04/2024)  Housing: Unknown (03/04/2024)  Transportation Needs: No Transportation Needs (03/04/2024)  Utilities: Not At Risk (03/04/2024)  Alcohol Screen: Low Risk  (10/22/2023)  Depression (PHQ2-9): Low Risk  (01/09/2024)  Financial Resource Strain: Low Risk  (10/18/2023)  Physical Activity: Insufficiently Active (10/22/2023)  Social Connections: Socially Integrated (03/04/2024)  Stress: No Stress Concern Present (10/22/2023)  Tobacco Use: Low Risk  (03/03/2024)  Health Literacy: Adequate Health Literacy (10/22/2023)   SDOH Interventions:     Readmission Risk Interventions     No data to display

## 2024-03-05 NOTE — Plan of Care (Signed)

## 2024-03-05 NOTE — Progress Notes (Signed)
 Occupational Therapy Treatment Patient Details Name: William Taylor MRN: [id number] DOB: Jun 19, 1930 Today's Date: 03/05/2024   History of present illness 89 y.o. male with medical history significant paroxysmal atrial fibrillation on amiodarone  and Xarelto , PE, hypertension, orthostatic hypotension, CKD stage IIIa. Pt is s/p left inguinal hernia repair on 02/27/2024 now admitted on 03/03/24  with surgical wound infection with cellulitis and abscess   OT comments  [name] was seen for OT treatment on this date. Upon arrival to room pt in bed, agreeable to tx. Pt requires SUPERVISION + RW for toilet t/f and clothing mgmt with 1 minor LOB in standing, self-corrects. Toelrates ~150 ft functional mobility in hallway, educated pt/family on RW use at home for safety. Pt making good progress toward goals, will continue to follow POC. Discharge recommendation remains appropriate.       If plan is discharge home, recommend the following:  A little help with walking and/or transfers;A little help with bathing/dressing/bathroom;Help with stairs or ramp for entrance   Equipment Recommendations  BSC/3in1    Recommendations for Other Services      Precautions / Restrictions Precautions Precautions: Fall Recall of Precautions/Restrictions: Intact Restrictions Weight Bearing Restrictions Per Provider Order: No       Mobility Bed Mobility Overal bed mobility: Needs Assistance Bed Mobility: Supine to Sit     Supine to sit: Supervision     General bed mobility comments: rails    Transfers Overall transfer level: Needs assistance Equipment used: Rolling walker (2 wheels) Transfers: Sit to/from Stand Sit to Stand: Supervision                 Balance Overall balance assessment: Needs assistance Sitting-balance support: No upper extremity supported, Feet supported Sitting balance-Leahy Scale: Good     Standing balance support: During functional activity, No upper extremity  supported Standing balance-Leahy Scale: Fair                             ADL either performed or assessed with clinical judgement   ADL Overall ADL's : Needs assistance/impaired                                       General ADL Comments: SUPERVISION + RW for toilet t/f and clothing mgmt with 1 minor LOB in standing, self-corrects    Extremity/Trunk Assessment              Vision       Perception     Praxis     Communication Communication Communication: Impaired Factors Affecting Communication: Hearing impaired   Cognition Arousal: Alert Behavior During Therapy: WFL for tasks assessed/performed Cognition: No apparent impairments                               Following commands: Intact        Cueing   Cueing Techniques: Verbal cues  Exercises      Shoulder Instructions       General Comments      Pertinent Vitals/ Pain       Pain Assessment Pain Assessment: Faces Faces Pain Scale: Hurts little more Pain Location: L groin Pain Descriptors / Indicators: Discomfort, Aching Pain Intervention(s): Limited activity within patient's tolerance, Repositioned  Home Living  Prior Functioning/Environment              Frequency  Min 3X/week        Progress Toward Goals  OT Goals(current goals can now be found in the care plan section)  Progress towards OT goals: Progressing toward goals  Acute Rehab OT Goals OT Goal Formulation: With patient/family Time For Goal Achievement: 03/18/24 Potential to Achieve Goals: Good ADL Goals Pt Will Perform Grooming: with modified independence;standing Pt Will Perform Lower Body Dressing: with modified independence;sit to/from stand Pt Will Transfer to Toilet: with modified independence;ambulating;regular height toilet  Plan      Co-evaluation                 AM-PAC OT 6 Clicks Daily Activity      Outcome Measure   Help from another person eating meals?: None Help from another person taking care of personal grooming?: A Little Help from another person toileting, which includes using toliet, bedpan, or urinal?: A Little Help from another person bathing (including washing, rinsing, drying)?: A Little Help from another person to put on and taking off regular upper body clothing?: None Help from another person to put on and taking off regular lower body clothing?: A Little 6 Click Score: 20    End of Session Equipment Utilized During Treatment: Rolling walker (2 wheels)  OT Visit Diagnosis: Other abnormalities of gait and mobility (R26.89);Muscle weakness (generalized) (M62.81)   Activity Tolerance Patient tolerated treatment well   Patient Left in chair;with call bell/phone within reach;with chair alarm set;with family/visitor present   Nurse Communication          Time: 9042-8976 OT Time Calculation (min): 26 min  Charges: OT General Charges $OT Visit: 1 Visit OT Treatments $Self Care/Home Management : 23-37 mins  Elston Slot, M.S. OTR/L  03/05/24, 10:27 AM  ascom [phone]

## 2024-03-05 NOTE — Progress Notes (Signed)
 03/05/2024  Subjective: No acute events.  Patient reports the left groin area is still tender, though improving after the I&D procedure.  Vital signs: Temp:  [98 F (36.7 C)-98.7 F (37.1 C)] 98.7 F (37.1 C) (07/18 0417) Pulse Rate:  [67-78] 67 (07/18 0417) Resp:  [16-17] 17 (07/18 0417) BP: (135-160)/(74-90) 153/82 (07/18 0417) SpO2:  [96 %-99 %] 96 % (07/18 0417)   Intake/Output: 07/17 0701 - 07/18 0700 In: 240 [P.O.:240] Out: 900 [Urine:900] Last BM Date : 03/02/24  Physical Exam: Constitutional:  No acute distress Abdomen:  Left groin with improving ecchymosis, less induration/erythema.  No purulence.  Gauze stained with old seroma type of fluid.  Wound packed again with 1/4 inch iodoform.  Labs:  Recent Labs    03/03/24 1305  WBC 9.8  HGB 9.8*  HCT 30.2*  PLT 256   Recent Labs    03/03/24 1305 03/05/24 0458  NA 135 138  K 3.9 4.8  CL 105 108  CO2 22 24  GLUCOSE 175* 94  BUN 19 16  CREATININE 1.28* 1.05  CALCIUM 9.5 9.1   No results for input(s): LABPROT, INR in the last 72 hours.  Imaging: No results found.  Assessment/Plan: This is a 89 y.o. male s/p open left inguinal hernia repair, with post-operative wound infection.  --Patient's wound is progressing appropriately.  Discussed with the patient that as the infection clears out, the erythema, induration, and tenderness will continue to improve.  No further imaging or procedures are needed at this point. --Continue packing wound with 1/4 inch iodoform once daily, cover with 4x4 gauze and tape.  Can transition to simple outer gauze dressing without packing on 03/09/24. --Continue oral Augmentin  for total 7 day course. --Follow up with Dr. Marinda on 03/11/24 as currently scheduled.   --Patient may be discharged home from surgical standpoint.   I spent 35 minutes dedicated to the care of this patient on the date of this encounter to include pre-visit review of records, face-to-face time with the  patient discussing diagnosis and management, and any post-visit coordination of care.  Aloysius Sheree Plant, MD [facility]

## 2024-03-11 ENCOUNTER — Ambulatory Visit (INDEPENDENT_AMBULATORY_CARE_PROVIDER_SITE_OTHER): Admitting: General Surgery

## 2024-03-11 ENCOUNTER — Encounter: Payer: Self-pay | Admitting: General Surgery

## 2024-03-11 VITALS — BP 112/71 | HR 88 | Ht 76.0 in | Wt 178.4 lb

## 2024-03-11 DIAGNOSIS — K4090 Unilateral inguinal hernia, without obstruction or gangrene, not specified as recurrent: Secondary | ICD-10-CM

## 2024-03-11 DIAGNOSIS — K4091 Unilateral inguinal hernia, without obstruction or gangrene, recurrent: Secondary | ICD-10-CM

## 2024-03-11 DIAGNOSIS — Z09 Encounter for follow-up examination after completed treatment for conditions other than malignant neoplasm: Secondary | ICD-10-CM

## 2024-03-11 NOTE — Patient Instructions (Signed)

## 2024-03-18 ENCOUNTER — Encounter: Payer: Self-pay | Admitting: Urology

## 2024-03-18 NOTE — Progress Notes (Signed)
 Outpatient Surgical Follow Up   William Taylor is an 89 y.o. male.   Chief Complaint  Patient presents with   Routine Post Op    Inguinal hernia repair 02/27/24    HPI: Returns today status post left open inguinal hernia repair.  Unfortunately had to be readmitted to the hospital due to superficial fluid collection.  This was incised and drained.  He reports continued soreness and swelling in the area.  He says that the bruising is getting better  Past Medical History:  Diagnosis Date   Acquired dilation of ascending aorta and aortic root (HCC)    Aortic atherosclerosis (HCC)    Atypical chest pain    BPH (benign prostatic hyperplasia)    Cerebral microvascular disease    CKD (chronic kidney disease), stage III (HCC)    Colon polyp    Compression fracture of T2 vertebra (HCC) 02/26/2023   Coronary artery disease    a.) PCI LAD (Cypher DES) in 2005; b.)  LHC 2009: no obstructive CAD and patent stent; c.) PCI OM2 12/26/2010 (2.25 x 16 mm Promus Element); d.) LHC 10/28/2013: LAD 52m ISR, 72m, D1 90, D2 70, OM2 80 w/ patent stent;  e.) 06/2019 Low risk MV; f.) 07/2020 MV: EF 77%, no ischemia/infarct.   CVA (cerebral vascular accident) [facility])    a.) age indeterminate infarcts in the RIGHT cerebellum and LEFT parietal lobe   Depression    Dyspnea    Gall stones    GERD (gastroesophageal reflux disease)    Glaucoma    Hyperlipidemia    Hypertension    Hypertrophic nonobstructive cardiomyopathy (with severe asymmetric septal hypertrophy)    IDA (iron deficiency anemia)    Left inguinal hernia    Leg weakness    Long term current use of amiodarone     Melanoma (HCC)    Multinodular thyroid     NSTEMI (non-ST elevated myocardial infarction) (HCC) 10/26/2013   a.) experienced in setting of antithrombotic hold (clopidogrel ) for routine colonoscopy; b.) troponins trended: 1.00 --> 5.10 --> 6.20 ng/mL; c.) LHC 10/31/2013: 10/40% mLAD, 90/70% D1, 80% OM1 -- med mgmt   NSTEMI (non-ST  elevated myocardial infarction) (HCC) 12/24/2010   a.) LHC 12/24/2010: 30 % LAD, 30% ISR mLAD, 50% dLAD, 95% OM2 --> d/t distal bifurcation lesion unable to see RCA --> transfer to [facility] --> PCI 12/26/2010 placing a 2.25 x 16 mm Promus Element DES to OM2   On rivaroxaban  therapy    Orthostatic hypotension    a. prev on midodrine /florinef /northera .   PAF (paroxysmal atrial fibrillation) (HCC)    a.) CHA2DS2VASc = 6 (age x2, HTN, CVA/PE x 2, prior MI/vascular disease) as of 02/24/2024; b.) rate/rhythm maintained on oral amiodarone ; chronically anticoagulated with rivaroxaban    Prostate cancer (HCC)    PSVT (paroxysmal supraventricular tachycardia) (HCC)    Pulmonary embolism (HCC) 2006   Skin cancer    Wears dentures    partial upper and lower   Wears hearing aid in both ears     Past Surgical History:  Procedure Laterality Date   CATARACT EXTRACTION Right 05/22/2011   CATARACT EXTRACTION W/PHACO Left 10/01/2023   Procedure: CATARACT EXTRACTION PHACO AND INTRAOCULAR LENS PLACEMENT (IOC) LEFT 23.44 01:56.2;  Surgeon: Mittie Gaskin, MD;  Location: [facility];  Service: Ophthalmology;  Laterality: Left;   colonoscopy     COLONOSCOPY W/ POLYPECTOMY  08/19/2013   Dr Jeri   COLONOSCOPY WITH PROPOFOL  N/A 08/15/2016   Procedure: COLONOSCOPY WITH PROPOFOL ;  Surgeon: Ruel Kung, MD;  Location: [facility];  Service: Endoscopy;  Laterality: N/A;   CORONARY ANGIOPLASTY WITH STENT PLACEMENT Left 2005   Procedure: CORONARY ANGIOPLASTY WITH STENT PLACEMENT; Location: [name])   CORONARY ANGIOPLASTY WITH STENT PLACEMENT Left 12/26/2010   Procedure: CORONARY ANGIOPLASTY WITH STENT PLACEMENT; Location: [facility]; Surgeon: Peter Swaziland, MD   ENTEROSCOPY N/A 09/22/2018   Procedure: ENTEROSCOPY;  Surgeon: Unk Corinn Skiff, MD;  Location: [facility] END[facility]COPY;  Service: Gastroenterology;  Laterality: N/A;   ESOPHAGOGASTRODUODENOSCOPY (EGD) WITH PROPOFOL  N/A 08/15/2016    Procedure: ESOPHAGOGASTRODUODENOSCOPY (EGD) WITH PROPOFOL ;  Surgeon: Ruel Kung, MD;  Location: [facility] END[facility];  Service: Endoscopy;  Laterality: N/A;   GIVENS CAPSULE STUDY N/A 09/26/2016   Procedure: GIVENS CAPSULE STUDY;  Surgeon: Ruel Kung, MD;  Location: [facility];  Service: Endoscopy;  Laterality: N/A;   HERNIA REPAIR  08/20/2011   INGUINAL HERNIA REPAIR Left 02/27/2024   Procedure: REPAIR, HERNIA, INGUINAL, ADULT, RECURRENT;  Surgeon: Marinda Jayson KIDD, MD;  Location: [facility];  Service: General;  Laterality: Left;  open procedure with mesh   LEFT HEART CATH AND CORONARY ANGIOGRAPHY N/A 04/29/2022   Procedure: LEFT HEART CATH AND CORONARY ANGIOGRAPHY;  Surgeon: Darron Deatrice LABOR, MD;  Location: [facility];  Service: Cardiovascular;  Laterality: N/A;   LEFT HEART CATH AND CORONARY ANGIOGRAPHY Left 2009   Procedure: LEFT HEART CATH AND CORONARY ANGIOGRAPHY; Location: [facility])   LEFT HEART CATH AND CORONARY ANGIOGRAPHY Left 12/24/2010   Procedure: LEFT HEART CATH AND CORONARY ANGIOGRAPHY; Locaiton: [facility]; Surgeon: [name], MD   LEFT HEART CATH AND CORONARY ANGIOGRAPHY Left 10/28/2013   Procedure: LEFT HEART CATH AND CORONARY ANGIOGRAPHY; Location: [facility]; Surgeon: [name], MD   LUMBAR SPINE SURGERY     x2 in [facility] in [phone]   UPPER GI ENDOSCOPY  04/19/2014   Dr Jeri    Family History  Problem Relation Age of Onset   Stroke Mother    Colon cancer Father    Coronary artery disease Brother    Other Brother 103       lymes dz    Social History:  reports that he has never smoked. He has never used smokeless tobacco. He reports that he does not drink alcohol and does not use drugs.  Allergies:  Allergies  Allergen Reactions   Bee Venom Swelling   Sulfasalazine Hives and Swelling   Sulfa Antibiotics Hives and Swelling   Warfarin     Chest pain   Warfarin And Related Other (See Comments)    Chest pain    Medications  reviewed.    ROS Full ROS performed and is otherwise negative other than what is stated in HPI   BP 112/71   Pulse 88   Ht 6' 4 (1.93 m)   Wt 178 lb 6.4 oz (80.9 kg)   SpO2 98%   BMI 21.72 kg/m   Physical Exam Left groin incision is healing well.  There is still bruising throughout the groin.  This is improved from when I saw him in the hospital.  There is still a good amount of swelling to the left testicle and along the cord.    No results found for this or any previous visit (from the past 48 hours). No results found.  Assessment/Plan:  Patient status post left open inguinal recurrent hernia repair.  Does have significant bruising to the area but it is improving.  Area of incision and drainage has healed well.  There is no evidence of  further fluctuance.  Still some swelling within the groin and into the cord.  Continue ice and as needed pain medication.  Follow-up in 4 weeks.   Jayson Endow, M.D. [facility]

## 2024-04-08 ENCOUNTER — Encounter: Payer: Self-pay | Admitting: General Surgery

## 2024-04-08 ENCOUNTER — Ambulatory Visit (INDEPENDENT_AMBULATORY_CARE_PROVIDER_SITE_OTHER): Admitting: General Surgery

## 2024-04-08 VITALS — BP 158/75 | HR 91 | Temp 97.7°F | Ht 76.0 in | Wt 181.4 lb

## 2024-04-08 DIAGNOSIS — K4091 Unilateral inguinal hernia, without obstruction or gangrene, recurrent: Secondary | ICD-10-CM

## 2024-04-08 DIAGNOSIS — Z09 Encounter for follow-up examination after completed treatment for conditions other than malignant neoplasm: Secondary | ICD-10-CM

## 2024-04-08 DIAGNOSIS — K4090 Unilateral inguinal hernia, without obstruction or gangrene, not specified as recurrent: Secondary | ICD-10-CM

## 2024-04-08 NOTE — Progress Notes (Signed)
 Patient returns today status post left inguinal hernia repair.  He reports doing well.  He says that there is still little soreness.  He denies any bulging.  On exam his incision is healing well and I do not appreciate any bulge or recurrence of hernia with Valsalva.  Recommend continue lifting restrictions for 6 weeks after surgery.  He can follow-up with us  as needed.

## 2024-04-08 NOTE — Patient Instructions (Signed)

## 2024-04-15 ENCOUNTER — Telehealth: Payer: Self-pay

## 2024-04-15 NOTE — Telephone Encounter (Signed)
 Grayce with [facility] called and form order # [id number]  was signed and sent back to [facility] but was not dated. Could not find document scanned in to pts chart yet. Grayce is faxing document back to [facility] to have date added. Sending note to [employer].

## 2024-04-15 NOTE — Telephone Encounter (Signed)
Refaxed document with date.

## 2024-04-21 ENCOUNTER — Encounter: Payer: Self-pay | Admitting: Family Medicine

## 2024-04-21 NOTE — Telephone Encounter (Signed)
 I know that I can schedule him for a nurse visit to get the flu shot. But before I reach out or you can respond to the patient , do you recommend he got the Covid vaccine.

## 2024-04-27 ENCOUNTER — Ambulatory Visit (INDEPENDENT_AMBULATORY_CARE_PROVIDER_SITE_OTHER)

## 2024-04-27 DIAGNOSIS — Z23 Encounter for immunization: Secondary | ICD-10-CM | POA: Diagnosis not present

## 2024-04-27 NOTE — Progress Notes (Signed)
 Per orders of Dr. Arlyss Solian, injection of HIGH DOSE 65+ FLU SHOT given by Laray Arenas in left deltoid. Patient tolerated injection well.

## 2024-04-28 ENCOUNTER — Other Ambulatory Visit: Payer: Self-pay | Admitting: Cardiovascular Disease

## 2024-04-28 ENCOUNTER — Other Ambulatory Visit: Payer: Self-pay

## 2024-04-28 DIAGNOSIS — E782 Mixed hyperlipidemia: Secondary | ICD-10-CM

## 2024-04-28 MED ORDER — FENOFIBRATE MICRONIZED 130 MG PO CAPS: ORAL_CAPSULE | ORAL | 0 refills | Status: DC

## 2024-04-28 NOTE — Telephone Encounter (Signed)
 Prescription refill request for Xarelto  received.  Indication:afib Last office visit:10/24 Weight:82.3  kg Age:89 Scr:1.05  7/25 CrCl:52.25  ml/min  Prescription refilled

## 2024-05-04 ENCOUNTER — Encounter: Payer: Self-pay | Admitting: Family Medicine

## 2024-05-05 ENCOUNTER — Telehealth: Payer: Self-pay | Admitting: Family Medicine

## 2024-05-05 NOTE — Telephone Encounter (Signed)
 Please triage patient about gait changes and see about getting him scheduled to soon as possible.  Thanks.

## 2024-05-05 NOTE — Telephone Encounter (Signed)
 Should he be scheduled to be seen in office

## 2024-05-06 ENCOUNTER — Ambulatory Visit: Payer: Self-pay

## 2024-05-06 NOTE — Telephone Encounter (Signed)
 Patient called back. Was sent to triage. Message has been sent to provider for review.

## 2024-05-06 NOTE — Telephone Encounter (Signed)
Noted. Will see at OV.  Thanks.  

## 2024-05-06 NOTE — Telephone Encounter (Signed)
 Left message to return call to our office.

## 2024-05-06 NOTE — Telephone Encounter (Signed)
 FYI Only or Action Required?: FYI only for provider.  Patient was last seen in primary care on 01/09/2024 by [name], MD.  Called Nurse Triage reporting Gait Problem.  Symptoms began several months ago.  Interventions attempted: Nothing.  Symptoms are: gradually worsening.  Triage Disposition: See PCP When Office is Open (Within 3 Days)  Patient/caregiver understands and will follow disposition?: Yes, will follow disposition  Copied from CRM [id number]. Topic: Clinical - Medical Advice >> May 06, 2024  1:54 PM Mia F wrote: Reason for CRM: Dr Cleatus would ike pt triaged for gait changes Reason for Disposition  [1] Weakness of arm / hand, or leg / foot AND [2] is a chronic symptom (recurrent or ongoing AND present > 4 weeks)  Answer Assessment - Initial Assessment Questions 1. SYMPTOM: What is the main symptom you are concerned about? (e.g., weakness, numbness)     Gradual worsening gait 2. ONSET: When did this start? (e.g., minutes, hours, days; while sleeping)     Over the last year 4. PATTERN Does this come and go, or has it been constant since it started?  Is it present now?     constant 6. NEUROLOGIC SYMPTOMS: Have you had any of the following symptoms: headache, dizziness, vision loss, double vision, changes in speech, unsteady on your feet?     denies  Pt states he has had gradual gate change, worsening. Offered appt tomorrow, pt declined as he has other healthcare appts tomorrow.  Protocols used: Neurologic Deficit-A-AH

## 2024-05-07 ENCOUNTER — Other Ambulatory Visit: Payer: Self-pay

## 2024-05-07 ENCOUNTER — Other Ambulatory Visit: Payer: Medicare HMO

## 2024-05-07 ENCOUNTER — Ambulatory Visit: Payer: Medicare HMO | Admitting: Oncology

## 2024-05-07 ENCOUNTER — Inpatient Hospital Stay: Payer: Medicare HMO | Attending: Oncology

## 2024-05-07 ENCOUNTER — Inpatient Hospital Stay: Payer: Medicare HMO | Admitting: Oncology

## 2024-05-07 VITALS — HR 76 | Temp 97.8°F | Resp 18 | Ht 76.0 in | Wt 181.9 lb

## 2024-05-07 DIAGNOSIS — Z8546 Personal history of malignant neoplasm of prostate: Secondary | ICD-10-CM

## 2024-05-07 DIAGNOSIS — D649 Anemia, unspecified: Secondary | ICD-10-CM

## 2024-05-07 DIAGNOSIS — D5912 Cold autoimmune hemolytic anemia: Secondary | ICD-10-CM

## 2024-05-07 DIAGNOSIS — Z800 Family history of malignant neoplasm of digestive organs: Secondary | ICD-10-CM | POA: Diagnosis not present

## 2024-05-07 LAB — CBC WITH DIFFERENTIAL (CANCER CENTER ONLY)
Abs Immature Granulocytes: 0.05 K/uL (ref 0.00–0.07)
Basophils Absolute: 0 K/uL (ref 0.0–0.1)
Basophils Relative: 0 %
Eosinophils Absolute: 0 K/uL (ref 0.0–0.5)
Eosinophils Relative: 0 %
HCT: 30.3 % — ABNORMAL LOW (ref 39.0–52.0)
Hemoglobin: 10 g/dL — ABNORMAL LOW (ref 13.0–17.0)
Immature Granulocytes: 1 %
Lymphocytes Relative: 19 %
Lymphs Abs: 1.6 K/uL (ref 0.7–4.0)
MCH: 33.7 pg (ref 26.0–34.0)
MCHC: 33 g/dL (ref 30.0–36.0)
MCV: 102 fL — ABNORMAL HIGH (ref 80.0–100.0)
Monocytes Absolute: 0.8 K/uL (ref 0.1–1.0)
Monocytes Relative: 9 %
Neutro Abs: 6.1 K/uL (ref 1.7–7.7)
Neutrophils Relative %: 71 %
Platelet Count: 253 K/uL (ref 150–400)
RBC: 2.97 MIL/uL — ABNORMAL LOW (ref 4.22–5.81)
RDW: 15.9 % — ABNORMAL HIGH (ref 11.5–15.5)
WBC Count: 8.6 K/uL (ref 4.0–10.5)
nRBC: 0 % (ref 0.0–0.2)

## 2024-05-07 LAB — CMP (CANCER CENTER ONLY)
ALT: 11 U/L (ref 0–44)
AST: 25 U/L (ref 15–41)
Albumin: 4 g/dL (ref 3.5–5.0)
Alkaline Phosphatase: 31 U/L — ABNORMAL LOW (ref 38–126)
Anion gap: 7 (ref 5–15)
BUN: 24 mg/dL — ABNORMAL HIGH (ref 8–23)
CO2: 24 mmol/L (ref 22–32)
Calcium: 9.4 mg/dL (ref 8.9–10.3)
Chloride: 106 mmol/L (ref 98–111)
Creatinine: 1.17 mg/dL (ref 0.61–1.24)
GFR, Estimated: 58 mL/min — ABNORMAL LOW (ref >60)
Glucose, Bld: 112 mg/dL — ABNORMAL HIGH (ref 70–99)
Potassium: 4.2 mmol/L (ref 3.5–5.1)
Sodium: 137 mmol/L (ref 135–145)
Total Bilirubin: 1.6 mg/dL — ABNORMAL HIGH (ref 0.0–1.2)
Total Protein: 6.7 g/dL (ref 6.5–8.1)

## 2024-05-07 LAB — PSA: Prostatic Specific Antigen: <0.02 ng/mL (ref 0.00–4.00)

## 2024-05-07 LAB — RETICULOCYTES
Immature Retic Fract: 11.7 % (ref 2.3–15.9)
RBC.: 3.01 MIL/uL — ABNORMAL LOW (ref 4.22–5.81)
Retic Count, Absolute: 74.3 K/uL (ref 19.0–186.0)
Retic Ct Pct: 2.5 % (ref 0.4–3.1)

## 2024-05-07 LAB — FOLATE: Folate: >20 ng/mL (ref >5.9)

## 2024-05-07 LAB — VITAMIN B12: Vitamin B-12: 334 pg/mL (ref 180–914)

## 2024-05-07 NOTE — Progress Notes (Signed)
 Patient doing okay. No new or acute concerns at this time.

## 2024-05-07 NOTE — Progress Notes (Signed)
 Hematology/Oncology Consult note [facility]  [phone] [phone]  Patient Care Team: [name], MD as PCP - General (Family Medicine) Perla Evalene PARAS, MD as PCP - Cardiology (Cardiology) Dessa, Reyes ORN, MD as Consulting Physician (General Surgery) Vannie Delon LABOR, MD as Referring Physician (Internal Medicine) Perla Evalene PARAS, MD as Consulting Physician (Cardiology) Melanee Annah BROCKS, MD as Consulting Physician (Oncology) Mittie Gaskin, MD as Referring Physician (Ophthalmology) Marinda Jayson KIDD, MD as Consulting Physician (General Surgery)   Name of the patient: William Taylor  [id number]  1929/12/11   Date of visit: 05/07/24  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 Hemoglobin at that time was stable around 10. Patient has been following up with Dr. Maribeth for his anemia and his hemoglobin has been more or less stable between 10-11 but at the last couple of values in November and December 2023 had drifted down to 9.5.    Results of anemia of blood work from 08/20/2022 showed normal B12 and iron levels.  Haptoglobin was less than 10.  Myeloma panel showed 0.3 IgM monoclonal protein kappa light chain specificity.  Reticulocyte count was mildly elevated at 3.2%  Interval history-patient is doing well for his age.  Denies any recent falls.  He was hospitalized in July 2025 for inguinal hernia wound infection and abscess.    ECOG PS- 2 Pain scale- 0   Review of systems- Review of Systems  Constitutional:  Positive for malaise/fatigue. Negative for chills, fever and weight loss.  HENT:  Negative for congestion, ear discharge and nosebleeds.   Eyes:  Negative for blurred vision.   Respiratory:  Negative for cough, hemoptysis, sputum production, shortness of breath and wheezing.   Cardiovascular:  Negative for chest pain, palpitations, orthopnea and claudication.  Gastrointestinal:  Negative for abdominal pain, blood in stool, constipation, diarrhea, heartburn, melena, nausea and vomiting.  Genitourinary:  Negative for dysuria, flank pain, frequency, hematuria and urgency.  Musculoskeletal:  Negative for back pain, joint pain and myalgias.  Skin:  Negative for rash.  Neurological:  Negative for dizziness, tingling, focal weakness, seizures, weakness and headaches.       Gait imbalance  Endo/Heme/Allergies:  Does not bruise/bleed easily.  Psychiatric/Behavioral:  Negative for depression and suicidal ideas. The patient does not have insomnia.       Allergies  Allergen Reactions   Bee Venom Swelling   Sulfasalazine Hives and Swelling   Sulfa Antibiotics Hives and Swelling   Warfarin     Chest pain   Warfarin And Related Other (See Comments)    Chest pain     Past Medical History:  Diagnosis Date   Acquired dilation of ascending aorta and aortic root (HCC)    Aortic atherosclerosis (HCC)    Atypical chest pain    BPH (benign prostatic hyperplasia)    Cerebral microvascular disease    CKD (chronic kidney disease), stage III (HCC)    Colon polyp    Compression fracture of T2 vertebra (HCC) 02/26/2023   Coronary artery disease    a.) PCI LAD (Cypher DES) in 2005; b.)  LHC 2009: no obstructive CAD and patent stent; c.) PCI OM2 12/26/2010 (2.25 x 16 mm Promus Element); d.) LHC 10/28/2013: LAD 65m ISR, 49m, D1 90, D2 70, OM2 80 w/  patent stent;  e.) 06/2019 Low risk MV; f.) 07/2020 MV: EF 77%, no ischemia/infarct.   CVA (cerebral vascular accident) [facility])    a.) age indeterminate infarcts in the RIGHT cerebellum and LEFT parietal lobe   Depression    Dyspnea    Gall stones    GERD (gastroesophageal reflux disease)    Glaucoma    Hyperlipidemia     Hypertension    Hypertrophic nonobstructive cardiomyopathy (with severe asymmetric septal hypertrophy)    IDA (iron deficiency anemia)    Left inguinal hernia    Leg weakness    Long term current use of amiodarone     Melanoma (HCC)    Multinodular thyroid     NSTEMI (non-ST elevated myocardial infarction) (HCC) 10/26/2013   a.) experienced in setting of antithrombotic hold (clopidogrel ) for routine colonoscopy; b.) troponins trended: 1.00 --> 5.10 --> 6.20 ng/mL; c.) LHC 10/31/2013: 10/40% mLAD, 90/70% D1, 80% OM1 -- med mgmt   NSTEMI (non-ST elevated myocardial infarction) (HCC) 12/24/2010   a.) LHC 12/24/2010: 30 % LAD, 30% ISR mLAD, 50% dLAD, 95% OM2 --> d/t distal bifurcation lesion unable to see RCA --> transfer to [facility] --> PCI 12/26/2010 placing a 2.25 x 16 mm Promus Element DES to OM2   On rivaroxaban  therapy    Orthostatic hypotension    a. prev on midodrine /florinef /northera .   PAF (paroxysmal atrial fibrillation) (HCC)    a.) CHA2DS2VASc = 6 (age x2, HTN, CVA/PE x 2, prior MI/vascular disease) as of 02/24/2024; b.) rate/rhythm maintained on oral amiodarone ; chronically anticoagulated with rivaroxaban    Prostate cancer (HCC)    PSVT (paroxysmal supraventricular tachycardia) (HCC)    Pulmonary embolism (HCC) 2006   Skin cancer    Wears dentures    partial upper and lower   Wears hearing aid in both ears      Past Surgical History:  Procedure Laterality Date   CATARACT EXTRACTION Right 05/22/2011   CATARACT EXTRACTION W/PHACO Left 10/01/2023   Procedure: CATARACT EXTRACTION PHACO AND INTRAOCULAR LENS PLACEMENT (IOC) LEFT 23.44 01:56.2;  Surgeon: Mittie Gaskin, MD;  Location[facility];  Service: Ophthalmology;  Laterality: Left;   colonoscopy     COLONOSCOPY W/ POLYPECTOMY  08/19/2013   Dr Jeri   COLONOSCOPY WITH PROPOFOL  N/A 08/15/2016   Procedure: COLONOSCOPY WITH PROPOFOL ;  Surgeon: Ruel Kung, MD;  Location: [facility] ENDOSCOPY;  Service: Endoscopy;   Laterality: N/A;   CORONARY ANGIOPLASTY WITH STENT PLACEMENT Left 2005   Procedure: CORONARY ANGIOPLASTY WITH STENT PLACEMENT; Location: [facility])   CORONARY ANGIOPLASTY WITH STENT PLACEMENT Left 12/26/2010   Procedure: CORONARY ANGIOPLASTY WITH STENT PLACEMENT; Location: [facility]; Surgeon: Peter Swaziland, MD   ENTEROSCOPY N/A 09/22/2018   Procedure: ENTEROSCOPY;  Surgeon: Unk Corinn Skiff, MD;  Location: [facility] ENDOSCOPY;  Service: Gastroenterology;  Laterality: N/A;   ESOPHAGOGASTRODUODENOSCOPY (EGD) WITH PROPOFOL  N/A 08/15/2016   Procedure: ESOPHAGOGASTRODUODENOSCOPY (EGD) WITH PROPOFOL ;  Surgeon: Ruel Kung, MD;  Location: [facility] ENDOSCOPY;  Service: Endoscopy;  Laterality: N/A;   GIVENS CAPSULE STUDY N/A 09/26/2016   Procedure: GIVENS CAPSULE STUDY;  Surgeon: Ruel Kung, MD;  Location: [facility] ENDOSCOPY;  Service: Endoscopy;  Laterality: N/A;   HERNIA REPAIR  08/20/2011   INGUINAL HERNIA REPAIR Left 02/27/2024   Procedure: REPAIR, HERNIA, INGUINAL, ADULT, RECURRENT;  Surgeon: Marinda Jayson KIDD, MD;  Location: [facility] ORS;  Service: General;  Laterality: Left;  open procedure with mesh   LEFT HEART CATH AND CORONARY ANGIOGRAPHY N/A 04/29/2022   Procedure: LEFT HEART CATH AND CORONARY ANGIOGRAPHY;  Surgeon: Darron Deatrice LABOR, MD;  Location: [facility] INVASIVE CV LAB;  Service: Cardiovascular;  Laterality: N/A;   LEFT HEART CATH AND CORONARY ANGIOGRAPHY Left 2009   Procedure: LEFT HEART CATH AND CORONARY ANGIOGRAPHY; Location: [facility])   LEFT HEART CATH AND CORONARY ANGIOGRAPHY Left 12/24/2010   Procedure: LEFT HEART CATH AND CORONARY ANGIOGRAPHY; Locaiton: [facility]; Surgeon: [name], MD   LEFT HEART CATH AND CORONARY ANGIOGRAPHY Left 10/28/2013   Procedure: LEFT HEART CATH AND CORONARY ANGIOGRAPHY; Location: [facility]; Surgeon: [name], MD   LUMBAR SPINE SURGERY     x2 in [facility] in [phone]   UPPER GI ENDOSCOPY  04/19/2014   Dr Jeri    Social History    Socioeconomic History   Marital status: Married    Spouse name: Not on file   Number of children: Not on file   Years of education: Not on file   Highest education level: 12th grade  Occupational History   Occupation: retired   Tobacco Use   Smoking status: Never   Smokeless tobacco: Never  Vaping Use   Vaping status: Never Used  Substance and Sexual Activity   Alcohol use: No   Drug use: No   Sexual activity: Not Currently  Other Topics Concern   Not on file  Social History Narrative   Retired city Emergency planning/management officer, 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     Married 2010   1 daughter in [place], [place], 2 daughters in [place], [place].   Social Drivers of Corporate investment banker Strain: Low Risk  (05/07/2024)   Overall Financial Resource Strain (CARDIA)    Difficulty of Paying Living Expenses: Not hard at all  Food Insecurity: No Food Insecurity (05/07/2024)   Hunger Vital Sign    Worried About Running Out of Food in the Last Year: Never true    Ran Out of Food in the Last Year: Never true  Transportation Needs: No Transportation Needs (05/07/2024)   PRAPARE - Administrator, Civil Service (Medical): No    Lack of Transportation (Non-Medical): No  Physical Activity: Insufficiently Active (05/07/2024)   Exercise Vital Sign    Days of Exercise per Week: 1 day    Minutes of Exercise per Session: 10 min  Stress: No Stress Concern Present (05/07/2024)   [employer] of Occupational Health - Occupational Stress Questionnaire    Feeling of Stress: Only a little  Social Connections: Socially Integrated (05/07/2024)   Social Connection and Isolation Panel    Frequency of Communication with Friends and Family: Twice a week    Frequency of Social Gatherings with Friends and Family: Three times a week    Attends Religious Services: More than 4 times per year    Active Member of Clubs or Organizations: Yes    Attends Banker Meetings: More than 4 times per year    Marital Status:  Married  Catering manager Violence: Not At Risk (03/04/2024)   Humiliation, Afraid, Rape, and Kick questionnaire    Fear of Current or Ex-Partner: No    Emotionally Abused: No    Physically Abused: No    Sexually Abused: No    Family History  Problem Relation Age of Onset   Stroke Mother    Colon cancer Father    Coronary artery disease Brother    Other Brother 42       lymes dz     Current Outpatient Medications:    acetaminophen  (TYLENOL ) 500 MG tablet, Take 500 mg by  mouth every 6 (six) hours as needed for moderate pain (pain score 4-6)., Disp: , Rfl:    amiodarone  (PACERONE ) 100 MG tablet, Take 1 tablet (100 mg total) by mouth daily., Disp: 90 tablet, Rfl: 3   cholecalciferol  (VITAMIN D3) 25 MCG (1000 UNIT) tablet, Take 1,000 Units by mouth once a week., Disp: , Rfl:    fenofibrate  micronized (ANTARA ) 130 MG capsule, TAKE 1 CAPSULE BY MOUTH  DAILY BEFORE BREAKFAST, Disp: 90 capsule, Rfl: 0   finasteride  (PROSCAR ) 5 MG tablet, TAKE 1 TABLET BY MOUTH DAILY, Disp: 90 tablet, Rfl: 3   levobunolol  (BETAGAN ) 0.5 % ophthalmic solution, Place 1 drop into the right eye 2 (two) times daily., Disp: , Rfl:    lisinopril  (ZESTRIL ) 5 MG tablet, Take 1 tablet (5 mg total) by mouth daily., Disp: 90 tablet, Rfl: 3   loratadine  (CLARITIN ) 10 MG tablet, Take 1 tablet (10 mg total) by mouth daily., Disp: 30 tablet, Rfl: 1   Multiple Vitamin (MULTIVITAMIN PO), Take 1 tablet by mouth daily., Disp: , Rfl:    nitroGLYCERIN  (NITROSTAT ) 0.4 MG SL tablet, DISSOLVE UNDER THE TONGUE 1 TABLET EVERY 5 MINUTES AS NEEDED FOR CHEST PAIN, Disp: 25 tablet, Rfl: 3   pantoprazole  (PROTONIX ) 40 MG tablet, TAKE ONE (1) TABLET BY MOUTH TWO TIMES PER DAY, Disp: 90 tablet, Rfl: 3   pravastatin  (PRAVACHOL ) 40 MG tablet, Take 1 tablet (40 mg total) by mouth at bedtime., Disp: 90 tablet, Rfl: 3   XARELTO  15 MG TABS tablet, TAKE 1 TABLET BY MOUTH DAILY, Disp: 90 tablet, Rfl: 3  Physical exam:  Vitals:   05/07/24 1308   Pulse: 76  Resp: 18  Temp: 97.8 F (36.6 C)  TempSrc: Tympanic  SpO2: 100%  Weight: 181 lb 14.4 oz (82.5 kg)  Height: 6' 4 (1.93 m)   Physical Exam Constitutional:      Comments: Ambulates with a cane.  Appears in no acute distress  Cardiovascular:     Rate and Rhythm: Normal rate and regular rhythm.     Heart sounds: Normal heart sounds.  Pulmonary:     Effort: Pulmonary effort is normal.     Breath sounds: Normal breath sounds.  Abdominal:     General: Bowel sounds are normal.     Palpations: Abdomen is soft.  Skin:    General: Skin is warm and dry.  Neurological:     Mental Status: He is alert and oriented to person, place, and time.      I have personally reviewed labs listed below:    Latest Ref Rng & Units 05/07/2024   12:47 PM  CMP  Glucose 70 - 99 mg/dL 887   BUN 8 - 23 mg/dL 24   Creatinine 9.38 - 1.24 mg/dL 8.82   Sodium 864 - 854 mmol/L 137   Potassium 3.5 - 5.1 mmol/L 4.2   Chloride 98 - 111 mmol/L 106   CO2 22 - 32 mmol/L 24   Calcium 8.9 - 10.3 mg/dL 9.4   Total Protein 6.5 - 8.1 g/dL 6.7   Total Bilirubin 0.0 - 1.2 mg/dL 1.6   Alkaline Phos 38 - 126 U/L 31   AST 15 - 41 U/L 25   ALT 0 - 44 U/L 11       Latest Ref Rng & Units 03/03/2024    1:05 PM  CBC  WBC 4.0 - 10.5 K/uL 9.8   Hemoglobin 13.0 - 17.0 g/dL 9.8   Hematocrit 60.9 - 52.0 % 30.2  Platelets 150 - 400 K/uL 256    I have personally reviewed Radiology images listed below: No images are attached to the encounter.  No results found.   Assessment and plan- Patient is a 89 y.o. male here for routine follow-up of following issues  Cold agglutinin hemolytic anemia: Hemoglobin presently stable between 9.5-10.5.  Labs from today are pending.  He has chronic cold agglutinin hemolytic anemia which has not required any treatment so far.  I have asked him to take more precautions in upcoming cold weather and to remain warm to prevent worsening hemolysis.  History ofProstate cancer: I am  checking his PSA yearly.  Labs CBC with differential and haptoglobin in 6 months in 1 year and I will see him back in 1 year.  Will also check B12 and folate today   Visit Diagnosis 1. Cold agglutinin disease (HCC)   2. History of prostate cancer      Dr. Annah Skene, MD, MPH [facility] [phone] 05/07/2024 1:39 PM

## 2024-05-09 LAB — HAPTOGLOBIN: Haptoglobin: <10 mg/dL — ABNORMAL LOW (ref 38–329)

## 2024-05-10 ENCOUNTER — Encounter: Payer: Self-pay | Admitting: Family Medicine

## 2024-05-10 ENCOUNTER — Ambulatory Visit: Admitting: Family Medicine

## 2024-05-10 VITALS — BP 142/78 | HR 72 | Temp 97.7°F | Ht 76.0 in | Wt 183.2 lb

## 2024-05-10 DIAGNOSIS — I10 Essential (primary) hypertension: Secondary | ICD-10-CM | POA: Diagnosis not present

## 2024-05-10 MED ORDER — LISINOPRIL 5 MG PO TABS: ORAL_TABLET | ORAL | Status: DC

## 2024-05-10 NOTE — Progress Notes (Unsigned)
 Gait changes.  Noted over the last few years.  Prev did PT w/o sig improvement.  Using a cane at baseline.  He'll feel like he is occ going to stumble. No syncope.  Very rarely lightheaded, unclear if that is related to gait changes.   He is using a walker at night, in low light situations.  He walked into clinic today without troubles.  Some days are better than others. He feels like he is more likely to stumble early the AM.  No numbness in the feet.  He takes shorter strides compared to his prev baseline.  He has some kyphosis at baseline.  No room spinning.  No tremor.  Smile is still normal.  He feels like he has some relatively weakness in the B lower legs. He fell about 4-5 years ago, not recently.   H/o orthostatic BP noted, with [facility] inpatient tx 2020.  Discussed.  Recent labs d/w pt, per hematology.    Meds, vitals, and allergies reviewed.   ROS: Per HPI unless specifically indicated in ROS section   nad TM wnl B Ncat Mmm Neck supple no LA Rrr Ctab S/S grossly wnl.  Gait symmetric without limp. Kyphosis noted.

## 2024-05-10 NOTE — Patient Instructions (Signed)
 I would stop lisinopril  for about 1 week and see if you feel better.  Either way, please let me know.  Take care.  Glad to see you.

## 2024-05-12 NOTE — Assessment & Plan Note (Signed)
 History of, also with history of orthostatic blood pressure concerns. I question if orthostatic changes contribute to his recent symptoms. I would stop lisinopril  for about 1 week and see if he feels better.  Either way, I asked him to please let me know.  I do not suspect an acute neurologic issue otherwise.  Okay for outpatient follow-up.  He agrees with plan.

## 2024-05-15 ENCOUNTER — Other Ambulatory Visit: Payer: Self-pay

## 2024-05-15 ENCOUNTER — Encounter: Payer: Self-pay | Admitting: *Deleted

## 2024-05-15 ENCOUNTER — Emergency Department

## 2024-05-15 ENCOUNTER — Emergency Department
Admission: EM | Admit: 2024-05-15 | Discharge: 2024-05-15 | Disposition: A | Discharge status: Home / Self Care | Attending: Emergency Medicine | Admitting: Emergency Medicine

## 2024-05-15 DIAGNOSIS — S20212A Contusion of left front wall of thorax, initial encounter: Secondary | ICD-10-CM | POA: Diagnosis not present

## 2024-05-15 DIAGNOSIS — W010XXA Fall on same level from slipping, tripping and stumbling without subsequent striking against object, initial encounter: Secondary | ICD-10-CM | POA: Insufficient documentation

## 2024-05-15 DIAGNOSIS — I7121 Aneurysm of the ascending aorta, without rupture: Secondary | ICD-10-CM | POA: Insufficient documentation

## 2024-05-15 DIAGNOSIS — S7001XA Contusion of right hip, initial encounter: Secondary | ICD-10-CM | POA: Insufficient documentation

## 2024-05-15 DIAGNOSIS — N1831 Chronic kidney disease, stage 3a: Secondary | ICD-10-CM | POA: Insufficient documentation

## 2024-05-15 DIAGNOSIS — I719 Aortic aneurysm of unspecified site, without rupture: Secondary | ICD-10-CM

## 2024-05-15 DIAGNOSIS — K8020 Calculus of gallbladder without cholecystitis without obstruction: Secondary | ICD-10-CM | POA: Insufficient documentation

## 2024-05-15 DIAGNOSIS — I480 Paroxysmal atrial fibrillation: Secondary | ICD-10-CM | POA: Diagnosis not present

## 2024-05-15 DIAGNOSIS — I129 Hypertensive chronic kidney disease with stage 1 through stage 4 chronic kidney disease, or unspecified chronic kidney disease: Secondary | ICD-10-CM | POA: Insufficient documentation

## 2024-05-15 DIAGNOSIS — S0990XA Unspecified injury of head, initial encounter: Secondary | ICD-10-CM | POA: Diagnosis present

## 2024-05-15 DIAGNOSIS — Z7901 Long term (current) use of anticoagulants: Secondary | ICD-10-CM | POA: Diagnosis not present

## 2024-05-15 DIAGNOSIS — S32009A Unspecified fracture of unspecified lumbar vertebra, initial encounter for closed fracture: Secondary | ICD-10-CM | POA: Insufficient documentation

## 2024-05-15 DIAGNOSIS — R52 Pain, unspecified: Secondary | ICD-10-CM

## 2024-05-15 DIAGNOSIS — S32038A Other fracture of third lumbar vertebra, initial encounter for closed fracture: Secondary | ICD-10-CM | POA: Diagnosis not present

## 2024-05-15 DIAGNOSIS — S6992XA Unspecified injury of left wrist, hand and finger(s), initial encounter: Secondary | ICD-10-CM | POA: Diagnosis present

## 2024-05-15 DIAGNOSIS — R0789 Other chest pain: Secondary | ICD-10-CM | POA: Diagnosis present

## 2024-05-15 DIAGNOSIS — I2510 Atherosclerotic heart disease of native coronary artery without angina pectoris: Secondary | ICD-10-CM | POA: Insufficient documentation

## 2024-05-15 DIAGNOSIS — W19XXXA Unspecified fall, initial encounter: Secondary | ICD-10-CM

## 2024-05-15 LAB — BASIC METABOLIC PANEL WITH GFR
Anion gap: 9 (ref 5–15)
BUN: 24 mg/dL — ABNORMAL HIGH (ref 8–23)
CO2: 23 mmol/L (ref 22–32)
Calcium: 9.5 mg/dL (ref 8.9–10.3)
Chloride: 106 mmol/L (ref 98–111)
Creatinine, Ser: 1.53 mg/dL — ABNORMAL HIGH (ref 0.61–1.24)
GFR, Estimated: 42 mL/min — ABNORMAL LOW (ref >60)
Glucose, Bld: 125 mg/dL — ABNORMAL HIGH (ref 70–99)
Potassium: 3.9 mmol/L (ref 3.5–5.1)
Sodium: 138 mmol/L (ref 135–145)

## 2024-05-15 LAB — CBC
HCT: 31 % — ABNORMAL LOW (ref 39.0–52.0)
Hemoglobin: 10.3 g/dL — ABNORMAL LOW (ref 13.0–17.0)
MCH: 33.7 pg (ref 26.0–34.0)
MCHC: 33.2 g/dL (ref 30.0–36.0)
MCV: 101.3 fL — ABNORMAL HIGH (ref 80.0–100.0)
Platelets: 267 K/uL (ref 150–400)
RBC: 3.06 MIL/uL — ABNORMAL LOW (ref 4.22–5.81)
RDW: 14.4 % (ref 11.5–15.5)
WBC: 6.9 K/uL (ref 4.0–10.5)
nRBC: 0 % (ref 0.0–0.2)

## 2024-05-15 LAB — TROPONIN I (HIGH SENSITIVITY)
Troponin I (High Sensitivity): 10 ng/L (ref <18)
Troponin I (High Sensitivity): 10 ng/L (ref <18)

## 2024-05-15 LAB — CK: Total CK: 29 U/L — ABNORMAL LOW (ref 49–397)

## 2024-05-15 MED ORDER — ACETAMINOPHEN 500 MG PO TABS
1000.0000 mg | ORAL_TABLET | ORAL | Status: AC
  Administered 2024-05-15: 1000 mg via ORAL
  Filled 2024-05-15: qty 2

## 2024-05-15 MED ORDER — SODIUM CHLORIDE 0.9 % IV BOLUS
500.0000 mL | Freq: Once | INTRAVENOUS | Status: AC
  Administered 2024-05-15: 500 mL via INTRAVENOUS

## 2024-05-15 NOTE — ED Notes (Signed)
 Pt ambulated without difficulty around ED with Cane. Pt denied any pain. Pt calling wife for ride home.

## 2024-05-15 NOTE — ED Notes (Signed)
 Fall risk bundle is currently in place.

## 2024-05-15 NOTE — ED Notes (Signed)
 PT in no acute distress prior to discharge. Discharged instructions reviewed and pt stated that they understand directions. Pt has all belongings with them at time of discharge.

## 2024-05-15 NOTE — ED Notes (Signed)
 Sent urine specimen to the lab with a temp sunquest label

## 2024-05-15 NOTE — ED Provider Notes (Signed)
 [facility] Provider Note    Event Date/Time   First MD Initiated Contact with Patient 05/15/24 1954     (approximate)   History   Fall, Chest Pain, and Hip Pain  HPI  William Taylor is a 89 y.o. male reports history of previous inguinal abscess and infection.  He also has a history of A-fib and takes blood thinner   PMH per prior discharge Discharge Diagnoses:  Principal Problem:   Surgical wound infection,  inguinal abscess and cellulitis Active Problems:   H/O left inguinal hernia repair 02/27/2024   CAD S/P percutaneous coronary angioplasty   Inadequate oral intake   Paroxysmal atrial fibrillation (HCC)   Hypertension   History of pulmonary embolism   CKD (chronic kidney disease), stage IIIa   Protein-calorie malnutrition, severe   Patient reports 2 days ago and got up to use the bathroom at night.  He grabbed onto the bar by his commode but the bar slipped, causing him to fall between the toilet and wall.  He was able to crawl out of that and fire department was able to assist him up.  He did not wish to come for evaluation at that time.  He reports that he has been sore since and he has been having a lot of aching when he moves over the left lateral chest and also in his right hip but still able to walk on it.  He has been taking Tylenol  and is been very sore and achy for 2 days but overall reports the soreness and pain is slowly improving.  He did probably hit his head a little but has not noticed any headache or neck pain.  Has been experiencing mild pain also in his lower back since the fall.  No numbness or weakness  Uses a cane and has been able to ambulate with it.  He presents today and wants to make certain he has not injured any ribs or his hip   No fevers no chills no preceding illness.  Has been able to get around his house has been able to have food water and does not feel like he has been getting dehydrated, but reports that his  family is constantly on him to drink water more often as he does get dehydrated easily  Patient relates she has had a history of balancing issues for years, has seen his primary for it.  He is also seeing specialist in the past and is come to the conclusion it is just from getting old  Physical Exam   Triage Vital Signs: ED Triage Vitals  Encounter Vitals Group     BP 05/15/24 1925 (!) 169/97     Girls Systolic BP Percentile --      Girls Diastolic BP Percentile --      Boys Systolic BP Percentile --      Boys Diastolic BP Percentile --      Pulse Rate 05/15/24 1925 81     Resp 05/15/24 1925 16     Temp 05/15/24 1925 98.1 F (36.7 C)     Temp Source 05/15/24 1925 Oral     SpO2 05/15/24 1925 100 %     Weight 05/15/24 1936 183 lb (83 kg)     Height 05/15/24 1936 6' 4 (1.93 m)     Head Circumference --      Peak Flow --      Pain Score 05/15/24 1928 8     Pain Loc --  Pain Education --      Exclude from Growth Chart --     Most recent vital signs: Vitals:   05/15/24 2100 05/15/24 2200  BP:    Pulse: 79 72  Resp: 14 12  Temp:    SpO2: 96% 99%     General: Awake, no distress.  Normocephalic atraumatic no cervical tenderness. No bruising or injuries noted to the chest.  Slight pleuritic discomfort to palpation of the left lateral chest wall.  He does report point tenderness over the mid left chest wall but no obvious excursion or deformities. CV:  Good peripheral perfusion.  Normal tone Resp:  Normal effort.  Clear bilateral Abd:  No distention.  Abdomen soft nontender nondistended throughout.  He does report pain over the lower right lumbar spine without deformity.  Additionally reports soreness but able to demonstrate good range of motion of the right hip.  He reports it is sore to walk on but is able to bear weight.  No noted pain with axial loading no deformity or shortening Other:  Fully alert well-oriented.  Very pleasant.   ED Results / Procedures / Treatments    Labs (all labs ordered are listed, but only abnormal results are displayed) Labs Reviewed  BASIC METABOLIC PANEL WITH GFR - Abnormal; Notable for the following components:      Result Value   Glucose, Bld 125 (*)    BUN 24 (*)    Creatinine, Ser 1.53 (*)    GFR, Estimated 42 (*)    All other components within normal limits  CBC - Abnormal; Notable for the following components:   RBC 3.06 (*)    Hemoglobin 10.3 (*)    HCT 31.0 (*)    MCV 101.3 (*)    All other components within normal limits  CK - Abnormal; Notable for the following components:   Total CK 29 (*)    All other components within normal limits  TROPONIN I (HIGH SENSITIVITY)  TROPONIN I (HIGH SENSITIVITY)   Labs indicate mild chronic anemia.  Troponin normal x 2.  Very atypical pain I suspect likely musculoskeletal.  Doubt ACS based on the presentation and examination history  EKG  Inter by me at 1930 heart rate 80 QRS 90 QTc 440 Normal sinus rhythm no evidence of acute ischemia.  Mild nonspecific T wave abnormality versus artifact   RADIOLOGY  CT CHEST ABDOMEN PELVIS WO CONTRAST Result Date: 05/15/2024 EXAM: CT CHEST, ABDOMEN AND PELVIS WITHOUT CONTRAST 05/15/2024 09:36:37 PM TECHNIQUE: CT of the chest, abdomen and pelvis was performed without the administration of intravenous contrast. Multiplanar reformatted images are provided for review. Automated exposure control, iterative reconstruction, and/or weight based adjustment of the mA/kV was utilized to reduce the radiation dose to as low as reasonably achievable. COMPARISON: None available. CLINICAL HISTORY: Chest trauma, blunt; LEFT chest pain after fall. Pt from home for fall that occurred 2 days ago. Was using his walker to get to the bathroom, missed the safety bar on his walker and fell. Now c/o left sided chest pain, R hip pain radiating around to his back. Pt is on xarelto , denies hitting his head this time. FINDINGS: MEDIASTINUM AND LYMPH NODES: Heart and  pericardium are unremarkable. The central airways are clear. No mediastinal, hilar or axillary lymphadenopathy. LUNGS AND PLEURA: No focal consolidation or pulmonary edema. No pleural effusion or pneumothorax. LIVER: The liver is unremarkable. GALLBLADDER AND BILE DUCTS: Cholelithiasis. No evidence of acute cholecystitis. No biliary ductal dilatation. SPLEEN: No acute abnormality.  PANCREAS: No acute abnormality. ADRENAL GLANDS: No acute abnormality. KIDNEYS, URETERS AND BLADDER: No stones in the kidneys or ureters. No hydronephrosis. No perinephric or periureteral stranding. Urinary bladder is unremarkable. GI AND BOWEL: Stomach demonstrates no acute abnormality. There is no bowel obstruction. Colonic diverticulosis without diverticulitis. Normal appendix. REPRODUCTIVE ORGANS: No acute abnormality. PERITONEUM AND RETROPERITONEUM: No ascites. No free air. VASCULATURE: Aortic atherosclerotic calcification. 4.2 cm abdominal aortic aneurysm. According to the ACR 2013 and SVS 2018 guidelines, the finding is consistent with a 4.2 cm abdominal aortic aneurysm and the recommendation is yearly surveillance imaging. ABDOMINAL AND PELVIS LYMPH NODES: No lymphadenopathy. REPRODUCTIVE ORGANS: No acute abnormality. BONES AND SOFT TISSUES: Acute nondisplaced fracture of the right transverse process of L3. Decreased soft tissue thickening and haziness about the left inguinal canal compared to visualized 16 to 2025. IMPRESSION: 1. Acute nondisplaced fracture of the right L3 transverse process. 2. 4.2 cm ascending aortic aneurysm; recommend yearly surveillance imaging per guidelines. 3. Cholelithiasis without evidence of acute cholecystitis. Electronically signed by: Norman Gatlin MD 05/15/2024 09:51 PM EDT RP Workstation: [id number]   DG Lumbar Spine 2-3 Views Result Date: 05/15/2024 CLINICAL DATA:  Fall 2 days ago, pain EXAM: LUMBAR SPINE - 2-3 VIEW COMPARISON:  None Available. FINDINGS: Disc space narrowing and spurring at  L4-5 and L5-S1. Degenerative facet disease diffusely throughout the lumbar spine. Normal alignment. No fracture. SI joints symmetric and unremarkable. IMPRESSION: Degenerative disc and facet disease.  No acute bony abnormality. Electronically Signed   By: Franky Crease M.D.   On: 05/15/2024 20:39   DG Hip Unilat  With Pelvis 2-3 Views Right Result Date: 05/15/2024 CLINICAL DATA:  Fall 2 days ago.  Pain. EXAM: DG HIP (WITH OR WITHOUT PELVIS) 2-3V RIGHT COMPARISON:  None Available. 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 No acute bony abnormality. Specifically, no fracture, subluxation, or dislocation. IMPRESSION: No acute bony abnormality. Electronically Signed   By: Franky Crease M.D.   On: 05/15/2024 20:38   DG Chest 1 View Result Date: 05/15/2024 CLINICAL DATA:  Chest pain EXAM: CHEST  1 VIEW COMPARISON:  04/20/2023 FINDINGS: Heart and mediastinal contours are within normal limits. No focal opacities or effusions. No acute bony abnormality. Aortic atherosclerosis. IMPRESSION: No active disease. Electronically Signed   By: Franky Crease M.D.   On: 05/15/2024 20:37   CT Cervical Spine Wo Contrast Result Date: 05/15/2024 CLINICAL DATA:  Neck trauma (Age >= 65y).  Fall. EXAM: CT CERVICAL SPINE WITHOUT CONTRAST TECHNIQUE: Multidetector CT imaging of the cervical spine was performed without intravenous contrast. Multiplanar CT image reconstructions were also generated. RADIATION DOSE REDUCTION: This exam was performed according to the departmental dose-optimization program which includes automated exposure control, adjustment of the mA and/or kV according to patient size and/or use of iterative reconstruction technique. COMPARISON:  02/26/2023 FINDINGS: Alignment: Normal Skull base and vertebrae: Slight chronic compression deformity at the T2 vertebral body, present on prior study but slightly progressed since prior study. No acute fracture. Soft tissues  and spinal canal: No prevertebral fluid or swelling. No visible canal hematoma. Disc levels: Degenerative disc changes at C6-7 with disc space narrowing, vacuum disc and spurring. Advanced bilateral degenerative facet disease. Upper chest: No acute findings Other: None IMPRESSION: Multilevel degenerative changes as above. No acute bony abnormality. Electronically Signed   By: Franky Crease M.D.   On: 05/15/2024 20:05   CT Head Wo Contrast Result Date: 05/15/2024 CLINICAL DATA:  Head trauma, minor (Age >=  65y).  Fall. EXAM: CT HEAD WITHOUT CONTRAST TECHNIQUE: Contiguous axial images were obtained from the base of the skull through the vertex without intravenous contrast. RADIATION DOSE REDUCTION: This exam was performed according to the departmental dose-optimization program which includes automated exposure control, adjustment of the mA and/or kV according to patient size and/or use of iterative reconstruction technique. COMPARISON:  01/04/2024 FINDINGS: Brain: Old left parietal infarct, stable. There is atrophy and chronic small vessel disease changes. No acute intracranial abnormality. Specifically, no hemorrhage, hydrocephalus, mass lesion, acute infarction, or significant intracranial injury. Vascular: No hyperdense vessel or unexpected calcification. Skull: No acute calvarial abnormality. Sinuses/Orbits: No acute findings Other: None IMPRESSION: Atrophy, chronic microvascular disease. No acute intracranial abnormality. 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 Electronically Signed   By: Franky Crease M.D.   On: 05/15/2024 20:03      PROCEDURES:  Critical Care performed: No  Procedures   MEDICATIONS ORDERED IN ED: Medications  sodium chloride  0.9 % bolus 500 mL (0 mLs Intravenous Stopped 05/15/24 2201)  acetaminophen  (TYLENOL ) tablet 1,000 mg (1,000 mg Oral Given 05/15/24 2100)     IMPRESSION / MDM / ASSESSMENT AND PLAN / ED COURSE  I reviewed the triage vital signs and the nursing notes.                               Differential diagnosis includes, but is not limited to, injury suffered from a fall such as rib fracture, contusion, hip injury, lumbar injury.  Does report hitting head but normocephalic atraumatic no cervical tenderness but reports use of anticoagulants CT imaging reassuring.  CT of the chest abdomen pelvis revealing no acute findings outside of a L3 transverse process fracture.  Ascending aortic aneurysm, does not appear to have acute clinical pathology.  Will have patient follow-up with vascular  Patient's presentation is most consistent with acute complicated illness / injury requiring diagnostic workup.      Clinical Course as of 05/16/24 0015  Sat May 15, 2024  1940 And interpreted by me at 1932 heart rate 80 QRS 90 QTc 440 Sinus rhythm.  No evidence of frank STEMI but there is ST segment abnormality noted primarily in inferior leads with inversions in biphasic appearance to lateral precordial.  Cannot exclude ischemia [MQ]  2153 L3 transverse process fracture.  No unstable fractures.  Does not have evidence of acute hip fracture or rib fractures on imaging utilizing CT modality.  At this juncture suspect primarily musculoskeletal discomfort with associated L3 transverse process fracture as well.  Patient reports his pain is improving, does not wish for anything stronger than Tylenol . [MQ]  2242 Reviewed results with patient.  Discussed with him that his concern would be happy to have social work evaluate for additional home resources, physical therapy etc. but patient reports that he is doing well.  He would like to go home with over-the-counter Tylenol  and continue with care for his wife.  He does not wish for any strong pain medications.  He otherwise feels well comfortable with plan for discharge to home.  Advises goal to stay in his own home.  His daughter is in the area and advises that she will be able to pick him up [MQ]  2251 Patient ambulating well  independently with steady gait.  Patient ready for discharge.  Advises family will come to pick him up.  Return precautions and treatment recommendations and follow-up discussed with the patient who is agreeable  with the plan.  Anticipate follow-up with Dr. Cleatus for all associated issues with careful interim precautions.  Patient aware of transverse process fracture neurovascularly intact, at this point I find there is no acute surgical intervention.  Referred to vascular surgery for follow-up on aortic aneurysm though I suspect given his age and history it would be unlikely future intervention would be plan unless an acute abrupt change [MQ]    Clinical Course User Index [MQ] Dicky Anes, MD   Workup reassuring.  Perhaps a very mild aspect of dehydration.  Patient awake alert.  Return precautions and treatment recommendations and follow-up discussed with the patient who is agreeable with the plan.   FINAL CLINICAL IMPRESSION(S) / ED DIAGNOSES   Final diagnoses:  Closed fracture of transverse process of lumbar vertebra, initial encounter (HCC)  Contusion of right hip, initial encounter  Contusion of left chest wall, initial encounter  Fall, initial encounter     Rx / DC Orders   ED Discharge Orders     None        Note:  This document was prepared using Dragon voice recognition software and may include unintentional dictation errors.   Dicky Anes, MD 05/16/24 [phone]

## 2024-05-15 NOTE — ED Triage Notes (Signed)
 Pt from home for fall that occurred 2 days ago. Was using his walker to get to the bathroom, missed the safety bar on his walker and fell. Now c/o left sided chest pain, R hip pain radiating around to his back. Pt is on xarelto , denies hitting his head this time.

## 2024-05-17 ENCOUNTER — Encounter: Payer: Self-pay | Admitting: Family Medicine

## 2024-05-20 ENCOUNTER — Encounter (INDEPENDENT_AMBULATORY_CARE_PROVIDER_SITE_OTHER): Admitting: Vascular Surgery

## 2024-05-22 DIAGNOSIS — I7121 Aneurysm of the ascending aorta, without rupture: Secondary | ICD-10-CM | POA: Insufficient documentation

## 2024-05-22 DIAGNOSIS — I77819 Aortic ectasia, unspecified site: Secondary | ICD-10-CM | POA: Insufficient documentation

## 2024-05-22 NOTE — Progress Notes (Unsigned)
 MRN : [id number]  William Taylor is a 89 y.o. (1929/10/11) male who presents with chief complaint of check circulation.  History of Present Illness:   The patient presents to the office for evaluation of an ascending thoracic aortic aneurysm. The aneurysm was found incidentally by CT scan. Patient denies chest pain or unusual back pain, no other chest or abdominal complaints.  No history of an abrupt onset of a painful toe associated with blue discoloration.     No family history of TAA/AAA.   Patient denies amaurosis fugax or TIA symptoms.  There is no history of claudication or rest pain symptoms of the lower extremities.   The patient denies angina or shortness of breath.  CT scan dated 05/15/2024, shows an ascending TAA that measures 4.39 cm.  The abdominal portion is 2.7 cm   Past Medical History:  Diagnosis Date   Acquired dilation of ascending aorta and aortic root    Aortic atherosclerosis    Atypical chest pain    BPH (benign prostatic hyperplasia)    Cerebral microvascular disease    CKD (chronic kidney disease), stage III (HCC)    Colon polyp    Compression fracture of T2 vertebra (HCC) 02/26/2023   Coronary artery disease    a.) PCI LAD (Cypher DES) in 2005; b.)  LHC 2009: no obstructive CAD and patent stent; c.) PCI OM2 12/26/2010 (2.25 x 16 mm Promus Element); d.) LHC 10/28/2013: LAD 64m ISR, 76m, D1 90, D2 70, OM2 80 w/ patent stent;  e.) 06/2019 Low risk MV; f.) 07/2020 MV: EF 77%, no ischemia/infarct.   CVA (cerebral vascular accident) [facility])    a.) age indeterminate infarcts in the RIGHT cerebellum and LEFT parietal lobe   Depression    Dyspnea    Gall stones    GERD (gastroesophageal reflux disease)    Glaucoma    Hyperlipidemia    Hypertension    Hypertrophic nonobstructive cardiomyopathy (with severe asymmetric septal hypertrophy)    IDA (iron deficiency anemia)    Left inguinal  hernia    Leg weakness    Long term current use of amiodarone     Melanoma (HCC)    Multinodular thyroid     NSTEMI (non-ST elevated myocardial infarction) (HCC) 10/26/2013   a.) experienced in setting of antithrombotic hold (clopidogrel ) for routine colonoscopy; b.) troponins trended: 1.00 --> 5.10 --> 6.20 ng/mL; c.) LHC 10/31/2013: 10/40% mLAD, 90/70% D1, 80% OM1 -- med mgmt   NSTEMI (non-ST elevated myocardial infarction) (HCC) 12/24/2010   a.) LHC 12/24/2010: 30 % LAD, 30% ISR mLAD, 50% dLAD, 95% OM2 --> d/t distal bifurcation lesion unable to see RCA --> transfer to [facility] --> PCI 12/26/2010 placing a 2.25 x 16 mm Promus Element DES to OM2   On rivaroxaban  therapy    Orthostatic hypotension    a. prev on midodrine /florinef /northera .   PAF (paroxysmal atrial fibrillation) (HCC)    a.) CHA2DS2VASc = 6 (age x2, HTN, CVA/PE x 2, prior MI/vascular disease) as of 02/24/2024; b.) rate/rhythm maintained on oral amiodarone ; chronically anticoagulated with rivaroxaban    Prostate cancer (HCC)  PSVT (paroxysmal supraventricular tachycardia)    Pulmonary embolism (HCC) 2006   Skin cancer    Wears dentures    partial upper and lower   Wears hearing aid in both ears     Past Surgical History:  Procedure Laterality Date   CATARACT EXTRACTION Right 05/22/2011   CATARACT EXTRACTION W/PHACO Left 10/01/2023   Procedure: CATARACT EXTRACTION PHACO AND INTRAOCULAR LENS PLACEMENT (IOC) LEFT 23.44 01:56.2;  Surgeon: Mittie Gaskin, MD;  Location: [facility];  Service: Ophthalmology;  Laterality: Left;   colonoscopy     COLONOSCOPY W/ POLYPECTOMY  08/19/2013   Dr Jeri   COLONOSCOPY WITH PROPOFOL  N/A 08/15/2016   Procedure: COLONOSCOPY WITH PROPOFOL ;  Surgeon: Ruel Kung, MD;  Location: [facility] ENDOSCOPY;  Service: Endoscopy;  Laterality: N/A;   CORONARY ANGIOPLASTY WITH STENT PLACEMENT Left 2005   Procedure: CORONARY ANGIOPLASTY WITH STENT PLACEMENT; Location: [facility])    CORONARY ANGIOPLASTY WITH STENT PLACEMENT Left 12/26/2010   Procedure: CORONARY ANGIOPLASTY WITH STENT PLACEMENT; Location: [facility]; Surgeon: Peter Swaziland, MD   ENTEROSCOPY N/A 09/22/2018   Procedure: ENTEROSCOPY;  Surgeon: Unk Corinn Skiff, MD;  Location: [facility] ENDOSCOPY;  Service: Gastroenterology;  Laterality: N/A;   ESOPHAGOGASTRODUODENOSCOPY (EGD) WITH PROPOFOL  N/A 08/15/2016   Procedure: ESOPHAGOGASTRODUODENOSCOPY (EGD) WITH PROPOFOL ;  Surgeon: Ruel Kung, MD;  Location: [facility] ENDOSCOPY;  Service: Endoscopy;  Laterality: N/A;   GIVENS CAPSULE STUDY N/A 09/26/2016   Procedure: GIVENS CAPSULE STUDY;  Surgeon: Ruel Kung, MD;  Location: [facility] ENDOSCOPY;  Service: Endoscopy;  Laterality: N/A;   HERNIA REPAIR  08/20/2011   INGUINAL HERNIA REPAIR Left 02/27/2024   Procedure: REPAIR, HERNIA, INGUINAL, ADULT, RECURRENT;  Surgeon: Marinda Jayson KIDD, MD;  Location: [facility] ORS;  Service: General;  Laterality: Left;  open procedure with mesh   LEFT HEART CATH AND CORONARY ANGIOGRAPHY N/A 04/29/2022   Procedure: LEFT HEART CATH AND CORONARY ANGIOGRAPHY;  Surgeon: Darron Deatrice LABOR, MD;  Location: [facility] INVASIVE CV LAB;  Service: Cardiovascular;  Laterality: N/A;   LEFT HEART CATH AND CORONARY ANGIOGRAPHY Left 2009   Procedure: LEFT HEART CATH AND CORONARY ANGIOGRAPHY; Location: [facility])   LEFT HEART CATH AND CORONARY ANGIOGRAPHY Left 12/24/2010   Procedure: LEFT HEART CATH AND CORONARY ANGIOGRAPHY; Locaiton: [facility]; Surgeon: [name], MD   LEFT HEART CATH AND CORONARY ANGIOGRAPHY Left 10/28/2013   Procedure: LEFT HEART CATH AND CORONARY ANGIOGRAPHY; Location: [facility]; Surgeon: [name], MD   LUMBAR SPINE SURGERY     x2 in [facility] in [phone]   UPPER GI ENDOSCOPY  04/19/2014   Dr Jeri    Social History Social History   Tobacco Use   Smoking status: Never   Smokeless tobacco: Never  Vaping Use   Vaping status: Never Used  Substance Use Topics   Alcohol use: No   Drug use:  No    Family History Family History  Problem Relation Age of Onset   Stroke Mother    Colon cancer Father    Coronary artery disease Brother    Other Brother 76       lymes dz    Allergies  Allergen Reactions   Bee Venom Swelling   Sulfasalazine Hives and Swelling   Warfarin     Chest pain     REVIEW OF SYSTEMS (Negative unless checked)  Constitutional: [] Weight loss  [] Fever  [] Chills Cardiac: [] Chest pain   [] Chest pressure   [] Palpitations   [] Shortness of breath when laying flat   [] Shortness of breath with exertion.  Vascular:  [x] Pain in legs with walking   [] Pain in legs at rest  [] History of DVT   [] Phlebitis   [] Swelling in legs   [] Varicose veins   [] Non-healing ulcers Pulmonary:   [] Uses home oxygen   [] Productive cough   [] Hemoptysis   [] Wheeze  [] COPD   [] Asthma Neurologic:  [] Dizziness   [] Seizures   [] History of stroke   [] History of TIA  [] Aphasia   [] Vissual changes   [] Weakness or numbness in arm   [] Weakness or numbness in leg Musculoskeletal:   [] Joint swelling   [] Joint pain   [] Low back pain Hematologic:  [] Easy bruising  [] Easy bleeding   [] Hypercoagulable state   [] Anemic Gastrointestinal:  [] Diarrhea   [] Vomiting  [] Gastroesophageal reflux/heartburn   [] Difficulty swallowing. Genitourinary:  [] Chronic kidney disease   [] Difficult urination  [] Frequent urination   [] Blood in urine Skin:  [] Rashes   [] Ulcers  Psychological:  [] History of anxiety   []  History of major depression.  Physical Examination  There were no vitals filed for this visit. There is no height or weight on file to calculate BMI. Gen: WD/WN, NAD Head: [place]/AT, No temporalis wasting.  Ear/Nose/Throat: Hearing grossly intact, nares w/o erythema or drainage Eyes: PER, EOMI, sclera nonicteric.  Neck: Supple, no masses.  No bruit or JVD.  Pulmonary:  Good air movement, no audible wheezing, no use of accessory muscles.  Cardiac: RRR, normal S1, S2, no Murmurs. Vascular:  mild trophic  changes, no open wounds Vessel Right Left  Radial Palpable Palpable  PT Not Palpable Not Palpable  DP Not Palpable Not Palpable  Gastrointestinal: soft, non-distended. No guarding/no peritoneal signs.  Musculoskeletal: M/S 5/5 throughout.  No visible deformity.  Neurologic: CN 2-12 intact. Pain and light touch intact in extremities.  Symmetrical.  Speech is fluent. Motor exam as listed above. Psychiatric: Judgment intact, Mood & affect appropriate for pt's clinical situation. Dermatologic: No rashes or ulcers noted.  No changes consistent with cellulitis.   CBC Lab Results  Component Value Date   WBC 6.9 05/15/2024   HGB 10.3 (L) 05/15/2024   HCT 31.0 (L) 05/15/2024   MCV 101.3 (H) 05/15/2024   PLT 267 05/15/2024    BMET    Component Value Date/Time   NA 138 05/15/2024 1937   NA 144 06/22/2019 1053   NA 138 02/27/2014 1335   K 3.9 05/15/2024 1937   K 4.1 02/27/2014 1335   CL 106 05/15/2024 1937   CL 106 02/27/2014 1335   CO2 23 05/15/2024 1937   CO2 26 02/27/2014 1335   GLUCOSE 125 (H) 05/15/2024 1937   GLUCOSE 109 (H) 02/27/2014 1335   BUN 24 (H) 05/15/2024 1937   BUN 20 06/22/2019 1053   BUN 26 (H) 02/27/2014 1335   CREATININE 1.53 (H) 05/15/2024 1937   CREATININE 1.17 05/07/2024 1247   CREATININE 1.48 (H) 02/27/2014 1335   CREATININE 1.22 12/13/2010 1040   CALCIUM 9.5 05/15/2024 1937   CALCIUM 8.5 02/27/2014 1335   GFRNONAA 42 (L) 05/15/2024 1937   GFRNONAA 58 (L) 05/07/2024 1247   GFRNONAA 43 (L) 02/27/2014 1335   GFRAA 54 (L) 01/13/2020 1711   GFRAA 50 (L) 02/27/2014 1335   Estimated Creatinine Clearance: 36.2 mL/min (A) (by C-G formula based on SCr of 1.53 mg/dL (H)).  COAG Lab Results  Component Value Date   INR 1.9 (H) 04/27/2022   INR 2.25 09/17/2018   INR 4.04 (HH) 09/16/2018    Radiology CT CHEST ABDOMEN PELVIS WO CONTRAST Result Date:  05/15/2024 EXAM: CT CHEST, ABDOMEN AND PELVIS WITHOUT CONTRAST 05/15/2024 09:36:37 PM TECHNIQUE: CT of the  chest, abdomen and pelvis was performed without the administration of intravenous contrast. Multiplanar reformatted images are provided for review. Automated exposure control, iterative reconstruction, and/or weight based adjustment of the mA/kV was utilized to reduce the radiation dose to as low as reasonably achievable. COMPARISON: None available. CLINICAL HISTORY: Chest trauma, blunt; LEFT chest pain after fall. Pt from home for fall that occurred 2 days ago. Was using his walker to get to the bathroom, missed the safety bar on his walker and fell. Now c/o left sided chest pain, R hip pain radiating around to his back. Pt is on xarelto , denies hitting his head this time. FINDINGS: MEDIASTINUM AND LYMPH NODES: Heart and pericardium are unremarkable. The central airways are clear. No mediastinal, hilar or axillary lymphadenopathy. LUNGS AND PLEURA: No focal consolidation or pulmonary edema. No pleural effusion or pneumothorax. LIVER: The liver is unremarkable. GALLBLADDER AND BILE DUCTS: Cholelithiasis. No evidence of acute cholecystitis. No biliary ductal dilatation. SPLEEN: No acute abnormality. PANCREAS: No acute abnormality. ADRENAL GLANDS: No acute abnormality. KIDNEYS, URETERS AND BLADDER: No stones in the kidneys or ureters. No hydronephrosis. No perinephric or periureteral stranding. Urinary bladder is unremarkable. GI AND BOWEL: Stomach demonstrates no acute abnormality. There is no bowel obstruction. Colonic diverticulosis without diverticulitis. Normal appendix. REPRODUCTIVE ORGANS: No acute abnormality. PERITONEUM AND RETROPERITONEUM: No ascites. No free air. VASCULATURE: Aortic atherosclerotic calcification. 4.2 cm abdominal aortic aneurysm. According to the ACR 2013 and SVS 2018 guidelines, the finding is consistent with a 4.2 cm abdominal aortic aneurysm and the recommendation is yearly surveillance imaging. ABDOMINAL AND PELVIS LYMPH NODES: No lymphadenopathy. REPRODUCTIVE ORGANS: No acute  abnormality. BONES AND SOFT TISSUES: Acute nondisplaced fracture of the right transverse process of L3. Decreased soft tissue thickening and haziness about the left inguinal canal compared to visualized 16 to 2025. IMPRESSION: 1. Acute nondisplaced fracture of the right L3 transverse process. 2. 4.2 cm ascending aortic aneurysm; recommend yearly surveillance imaging per guidelines. 3. Cholelithiasis without evidence of acute cholecystitis. Electronically signed by: Norman Gatlin MD 05/15/2024 09:51 PM EDT RP Workstation: [id number]   DG Lumbar Spine 2-3 Views Result Date: 05/15/2024 CLINICAL DATA:  Fall 2 days ago, pain EXAM: LUMBAR SPINE - 2-3 VIEW COMPARISON:  None Available. FINDINGS: Disc space narrowing and spurring at L4-5 and L5-S1. Degenerative facet disease diffusely throughout the lumbar spine. Normal alignment. No fracture. SI joints symmetric and unremarkable. IMPRESSION: Degenerative disc and facet disease.  No acute bony abnormality. Electronically Signed   By: Franky Crease M.D.   On: 05/15/2024 20:39   DG Hip Unilat  With Pelvis 2-3 Views Right Result Date: 05/15/2024 CLINICAL DATA:  Fall 2 days ago.  Pain. EXAM: DG HIP (WITH OR WITHOUT PELVIS) 2-3V RIGHT COMPARISON:  None Available. 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 No acute bony abnormality. Specifically, no fracture, subluxation, or dislocation. IMPRESSION: No acute bony abnormality. Electronically Signed   By: Franky Crease M.D.   On: 05/15/2024 20:38   DG Chest 1 View Result Date: 05/15/2024 CLINICAL DATA:  Chest pain EXAM: CHEST  1 VIEW COMPARISON:  04/20/2023 FINDINGS: Heart and mediastinal contours are within normal limits. No focal opacities or effusions. No acute bony abnormality. Aortic atherosclerosis. IMPRESSION: No active disease. Electronically Signed   By: Franky Crease M.D.   On: 05/15/2024 20:37   CT Cervical Spine Wo Contrast Result Date:  05/15/2024  CLINICAL DATA:  Neck trauma (Age >= 65y).  Fall. EXAM: CT CERVICAL SPINE WITHOUT CONTRAST TECHNIQUE: Multidetector CT imaging of the cervical spine was performed without intravenous contrast. Multiplanar CT image reconstructions were also generated. RADIATION DOSE REDUCTION: This exam was performed according to the departmental dose-optimization program which includes automated exposure control, adjustment of the mA and/or kV according to patient size and/or use of iterative reconstruction technique. COMPARISON:  02/26/2023 FINDINGS: Alignment: Normal Skull base and vertebrae: Slight chronic compression deformity at the T2 vertebral body, present on prior study but slightly progressed since prior study. No acute fracture. Soft tissues and spinal canal: No prevertebral fluid or swelling. No visible canal hematoma. Disc levels: Degenerative disc changes at C6-7 with disc space narrowing, vacuum disc and spurring. Advanced bilateral degenerative facet disease. Upper chest: No acute findings Other: None IMPRESSION: Multilevel degenerative changes as above. No acute bony abnormality. Electronically Signed   By: Franky Crease M.D.   On: 05/15/2024 20:05   CT Head Wo Contrast Result Date: 05/15/2024 CLINICAL DATA:  Head trauma, minor (Age >= 65y).  Fall. EXAM: CT HEAD WITHOUT CONTRAST TECHNIQUE: Contiguous axial images were obtained from the base of the skull through the vertex without intravenous contrast. RADIATION DOSE REDUCTION: This exam was performed according to the departmental dose-optimization program which includes automated exposure control, adjustment of the mA and/or kV according to patient size and/or use of iterative reconstruction technique. COMPARISON:  01/04/2024 FINDINGS: Brain: Old left parietal infarct, stable. There is atrophy and chronic small vessel disease changes. No acute intracranial abnormality. Specifically, no hemorrhage, hydrocephalus, mass lesion, acute infarction, or  significant intracranial injury. Vascular: No hyperdense vessel or unexpected calcification. Skull: No acute calvarial abnormality. Sinuses/Orbits: No acute findings Other: None IMPRESSION: Atrophy, chronic microvascular disease. No acute intracranial abnormality. 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 Electronically Signed   By: Franky Crease M.D.   On: 05/15/2024 20:03     Assessment/Plan There are no diagnoses linked to this encounter.   Cordella Shawl, MD  05/22/2024 12:29 PM

## 2024-05-24 ENCOUNTER — Encounter (INDEPENDENT_AMBULATORY_CARE_PROVIDER_SITE_OTHER): Payer: Self-pay | Admitting: Vascular Surgery

## 2024-05-24 ENCOUNTER — Ambulatory Visit (INDEPENDENT_AMBULATORY_CARE_PROVIDER_SITE_OTHER): Admitting: Vascular Surgery

## 2024-05-24 VITALS — BP 134/71 | HR 83 | Resp 16 | Ht 76.0 in | Wt 176.6 lb

## 2024-05-24 DIAGNOSIS — I7121 Aneurysm of the ascending aorta, without rupture: Secondary | ICD-10-CM

## 2024-05-24 DIAGNOSIS — E782 Mixed hyperlipidemia: Secondary | ICD-10-CM

## 2024-05-24 DIAGNOSIS — I10 Essential (primary) hypertension: Secondary | ICD-10-CM

## 2024-05-24 DIAGNOSIS — I480 Paroxysmal atrial fibrillation: Secondary | ICD-10-CM

## 2024-05-24 DIAGNOSIS — I77819 Aortic ectasia, unspecified site: Secondary | ICD-10-CM | POA: Diagnosis not present

## 2024-05-25 ENCOUNTER — Encounter (INDEPENDENT_AMBULATORY_CARE_PROVIDER_SITE_OTHER): Payer: Self-pay | Admitting: Vascular Surgery

## 2024-06-07 ENCOUNTER — Other Ambulatory Visit: Payer: Self-pay | Admitting: Family Medicine

## 2024-06-08 NOTE — Telephone Encounter (Signed)
 Lisinopril  Last filled:  04/12/24 Last OV:  05/10/24, gait change Next OV:  none

## 2024-06-09 NOTE — Telephone Encounter (Signed)
 Please check with patient.  Is he still taking lisinopril ?  How is his blood pressure?  How does he feel?  Please let me know and I can work on the refill if needed.  Thanks.

## 2024-06-10 NOTE — Telephone Encounter (Signed)
 Spoke with pt asking Dr Elfredia questions. Pt states he's not taking lisinopril . Says BP is pretty steady around 135/70 and he's feeling fine.

## 2024-06-11 NOTE — Telephone Encounter (Signed)
Noted.  Thanks.  I cancelled the rx.

## 2024-06-27 ENCOUNTER — Encounter: Payer: Self-pay | Admitting: Family Medicine

## 2024-06-30 ENCOUNTER — Telehealth: Payer: Self-pay | Admitting: Family Medicine

## 2024-06-30 NOTE — Telephone Encounter (Signed)
 Please triage patient about his gait changes.  Please see MyChart message.  Thanks.

## 2024-06-30 NOTE — Telephone Encounter (Signed)
 Noted. Thanks.

## 2024-06-30 NOTE — Telephone Encounter (Signed)
 I spoke with pt and  pts wife (DPR signed); pt has not had recent fall or injury. Pt said for the last 1 1/2 weeks pt has had more weakness and pain in both knees. At times it is difficult for pt to walk due to worsening weakness in both knees.pt is having some difficulty in keeping balance also. Pt is walking with walker now. Pt scheduled appt with Dr Watt on 07/01/24 at 11:00 am and pt will arrive at 10:45 to get checked in. UC & ED precautions given and pt voiced understanding. Sending note to Dr Watt and  RICK to Dr Cleatus as PCP.

## 2024-07-01 ENCOUNTER — Ambulatory Visit: Admitting: Family Medicine

## 2024-07-01 ENCOUNTER — Encounter: Payer: Self-pay | Admitting: Family Medicine

## 2024-07-01 VITALS — BP 130/70 | HR 82 | Temp 98.0°F | Ht 76.0 in | Wt 177.4 lb

## 2024-07-01 DIAGNOSIS — R2689 Other abnormalities of gait and mobility: Secondary | ICD-10-CM | POA: Diagnosis not present

## 2024-07-01 DIAGNOSIS — Z9181 History of falling: Secondary | ICD-10-CM | POA: Diagnosis not present

## 2024-07-01 DIAGNOSIS — R54 Age-related physical debility: Secondary | ICD-10-CM

## 2024-07-01 DIAGNOSIS — M170 Bilateral primary osteoarthritis of knee: Secondary | ICD-10-CM | POA: Diagnosis not present

## 2024-07-01 DIAGNOSIS — R296 Repeated falls: Secondary | ICD-10-CM

## 2024-07-01 DIAGNOSIS — R29898 Other symptoms and signs involving the musculoskeletal system: Secondary | ICD-10-CM

## 2024-07-01 MED ORDER — TRIAMCINOLONE ACETONIDE 40 MG/ML IJ SUSP
40.0000 mg | Freq: Once | INTRAMUSCULAR | Status: AC
  Administered 2024-07-01: 40 mg via INTRA_ARTICULAR

## 2024-07-01 NOTE — Progress Notes (Signed)
 Malaia Buchta T. Jemar Paulsen, MD, CAQ Sports Medicine [facility] at [facility] [address] [place], [address]  Phone: [phone]  FAX: [phone]  William Taylor - 89 y.o. male  MRN [id number]  Date of Birth: [birth year]  Date: 07/01/2024  PCP: [name], MD  Referral: [name], MD  Chief Complaint  Patient presents with   Knee Pain    Bilateral   Extremity Weakness    Legs   Hip Pain    Bilateral Hips   Subjective:   William Taylor is a 89 y.o. very pleasant male patient with Body mass index is 21.59 kg/m. who presents with the following:  Discussed the use of AI scribe software for clinical note transcription with the patient, who gave verbal consent to proceed.  This is a patient of Dr. Elfredia who I have not met before.  He is age 57 and he and his wife have some concerns about bilateral leg weakness, imbalance, fall risk, chronic knee pain chronic chronic hip pain, bilateral severe osteoarthritis of both knees as well as mild to moderate bilateral hip arthritis.  HH PT order, strength, weakness, falls - Reena Laine, use HH PT  B knee injection History of Present Illness William Taylor is a 89 year old male with significant knee arthritis who presents with worsening knee pain and weakness.  Over the past ten days to two weeks, he has experienced worsening knee pain and weakness, primarily in both knees, making it difficult to stand and walk. The pain is exacerbated by a history of knee injuries from years ago, which have progressively worsened with age. He struggles to rise from a seated position and often requires support to stand.  He has a history of hip arthritis, which is milder compared to his knee arthritis, and bursitis in the hips, contributing to his discomfort. He has experienced multiple falls, including one in September following a hernia operation that became infected, necessitating a drain. He has not left  the house in a week due to his condition.  He previously underwent physical therapy, which he found too strenuous, and has been using an elliptical machine twice daily for about a week.  He is currently on Xarelto  and amiodarone . He has a history of vertebral fractures and multiple falls, raising concerns about his fall risk. He has experienced recent weight loss, affecting the fit of his clothing. No recent physical therapy related to current issues, but he reports difficulty standing and walking and has experienced multiple falls.   Review of Systems is noted in the HPI, as appropriate  Objective:   BP 130/70   Pulse 82   Temp 98 F (36.7 C) (Temporal)   Ht 6' 4 (1.93 m)   Wt 177 lb 6 oz (80.5 kg)   SpO2 100%   BMI 21.59 kg/m   GEN: No acute distress; alert,appropriate. PULM: Breathing comfortably in no respiratory distress PSYCH: Normally interactive.   Bilateral knee exam with mild effusion Bilateral medial lateral joint line tenderness Significant patellar crepitus Ligamentous structures intact Any kind of forced flexion causes pain Notable quadriceps wasting bilaterally  Laboratory and Imaging Data:  Assessment and Plan:     ICD-10-CM   1. Weakness of both lower extremities  R29.898 Ambulatory referral to Home Health    2. Primary osteoarthritis of both knees  M17.0 triamcinolone  acetonide (KENALOG -40) injection 40 mg    triamcinolone  acetonide (KENALOG -40) injection 40 mg    Ambulatory  referral to Home Health    3. Balance problem  R26.89 Ambulatory referral to Home Health    4. At high risk for falls  Z91.81 Ambulatory referral to Home Health    5. Multiple falls  R29.6 Ambulatory referral to Home Health    6. Age-related physical debility  R54 Ambulatory referral to Home Health     Total encounter time: 30 minutes. This includes total time spent on the day of encounter.  This is a complicated issue, and it is not limited to knee or hip arthritis.   Extensive chart review, radiograph review, and discussion with the patient and his wife. Assessment & Plan Bilateral primary osteoarthritis of knee Chronic severe osteoarthritis causing significant pain and mobility issues. Previous physical therapy was too strenuous. Current management includes elliptical use. Discussed knee injections and insurance coverage for gel injections. - Arranged home health physical therapy for strength and balance. - Administered bilateral knee injections.  I reviewed multiple options with them today for treatment given his age, comorbidities, lab work, current medications, and we decided that joint injection today would be of the greatest impact to his pain management. - Discussed insurance coverage for gel injections, we will check his viscosupplementation benefits.  This is a complicated issue, and it is not limited to knee or hip arthritis.  Osteoarthritis of bilateral hips Mild osteoarthritis contributing to joint pain and mobility issues.  Multifactorial weakness and increased fall risk Significant weakness and fall risk due to age-related muscle atrophy, chronic pain, and previous falls. High risk of catastrophic injury from falls. - Arranged home health physical therapy for strength and balance. - Recommended lift chair to assist with standing and reduce fall risk.  We will also arrange for the patient to have home health PT assessment and have them work with him in the home  Aspiration/Injection Procedure Note William Taylor 08/14/1930 Date of procedure: 07/01/2024  Procedure: Large Joint Joint Aspiration / Injection of the Right Knee Indications: Pain  Procedure Details Patient verbally consented to procedure. Risks, benefits, and alternatives explained. Sterilely prepped with Chloraprep. Ethyl cholride used for anesthesia. 9 cc Lidocaine  1% mixed with 1 mL Kenalog  40 mg injected using the anteromedial approach without difficulty. No complications  with procedure and tolerated well. Patient had decreased pain post-injection.  Medication: 1 mL of Kenalog  40 mg  Aspiration/Injection Procedure Note William Taylor 11-Jul-1930 Date of procedure: 07/01/2024  Procedure: Large Joint Aspiration / Injection of the Left Knee Indications: Pain  Procedure Details Patient verbally consented to procedure. Risks, benefits, and alternatives explained. Sterilely prepped with Chloraprep. Ethyl cholride used for anesthesia. 9 cc Lidocaine  1% mixed with 1 mL Kenalog  40 mg injected using the anteromedial approach without difficulty. No complications with procedure and tolerated well. Patient had decreased pain post-injection.  Medication: 1 mL of Kenalog  40 mg   Medication Management during today's office visit: Meds ordered this encounter  Medications   triamcinolone  acetonide (KENALOG -40) injection 40 mg   triamcinolone  acetonide (KENALOG -40) injection 40 mg   Medications Discontinued During This Encounter  Medication Reason   Ferrous Sulfate  (IRON PO) Completed Course   cholecalciferol  (VITAMIN D3) 25 MCG (1000 UNIT) tablet Patient Preference    Orders placed today for conditions managed today: Orders Placed This Encounter  Procedures   Ambulatory referral to Home Health    Disposition: No follow-ups on file.  [employer] One speech-to-text software was used for transcription in this dictation.  Possible transcriptional errors can occur using Animal nutritionist.  Signed,  Jacques DASEN. Dodge Ator, MD   Outpatient Encounter Medications as of 07/01/2024  Medication Sig   acetaminophen  (TYLENOL ) 500 MG tablet Take 500 mg by mouth every 6 (six) hours as needed for moderate pain (pain score 4-6).   amiodarone  (PACERONE ) 100 MG tablet Take 1 tablet (100 mg total) by mouth daily.   fenofibrate  micronized (ANTARA ) 130 MG capsule TAKE 1 CAPSULE BY MOUTH  DAILY BEFORE BREAKFAST   finasteride  (PROSCAR ) 5 MG tablet TAKE 1 TABLET BY MOUTH DAILY    levobunolol  (BETAGAN ) 0.5 % ophthalmic solution Place 1 drop into the right eye 2 (two) times daily.   loratadine  (CLARITIN ) 10 MG tablet Take 1 tablet (10 mg total) by mouth daily.   Multiple Vitamin (MULTIVITAMIN PO) Take 1 tablet by mouth daily.   nitroGLYCERIN  (NITROSTAT ) 0.4 MG SL tablet DISSOLVE UNDER THE TONGUE 1 TABLET EVERY 5 MINUTES AS NEEDED FOR CHEST PAIN   pantoprazole  (PROTONIX ) 40 MG tablet TAKE ONE (1) TABLET BY MOUTH TWO TIMES PER DAY   pravastatin  (PRAVACHOL ) 40 MG tablet Take 1 tablet (40 mg total) by mouth at bedtime.   XARELTO  15 MG TABS tablet TAKE 1 TABLET BY MOUTH DAILY   [DISCONTINUED] Ferrous Sulfate  (IRON PO) Take 1 tablet by mouth daily.   [DISCONTINUED] cholecalciferol  (VITAMIN D3) 25 MCG (1000 UNIT) tablet Take 1,000 Units by mouth once a week. (Patient not taking: Reported on 07/01/2024)   [EXPIRED] triamcinolone  acetonide (KENALOG -40) injection 40 mg    [EXPIRED] triamcinolone  acetonide (KENALOG -40) injection 40 mg    No facility-administered encounter medications on file as of 07/01/2024.

## 2024-07-05 ENCOUNTER — Other Ambulatory Visit: Payer: Self-pay | Admitting: Urology

## 2024-07-05 ENCOUNTER — Other Ambulatory Visit: Payer: Self-pay | Admitting: Family Medicine

## 2024-07-05 DIAGNOSIS — E782 Mixed hyperlipidemia: Secondary | ICD-10-CM

## 2024-07-20 ENCOUNTER — Encounter: Payer: Self-pay | Admitting: Family Medicine

## 2024-07-22 ENCOUNTER — Emergency Department

## 2024-07-22 ENCOUNTER — Observation Stay
Admission: EM | Admit: 2024-07-22 | Discharge: 2024-07-24 | Disposition: A | Attending: Internal Medicine | Admitting: Internal Medicine

## 2024-07-22 ENCOUNTER — Other Ambulatory Visit: Payer: Self-pay

## 2024-07-22 DIAGNOSIS — I25118 Atherosclerotic heart disease of native coronary artery with other forms of angina pectoris: Secondary | ICD-10-CM | POA: Insufficient documentation

## 2024-07-22 DIAGNOSIS — D539 Nutritional anemia, unspecified: Secondary | ICD-10-CM

## 2024-07-22 DIAGNOSIS — D509 Iron deficiency anemia, unspecified: Secondary | ICD-10-CM | POA: Insufficient documentation

## 2024-07-22 DIAGNOSIS — Z85828 Personal history of other malignant neoplasm of skin: Secondary | ICD-10-CM | POA: Diagnosis not present

## 2024-07-22 DIAGNOSIS — Z86711 Personal history of pulmonary embolism: Secondary | ICD-10-CM | POA: Diagnosis not present

## 2024-07-22 DIAGNOSIS — Z9889 Other specified postprocedural states: Secondary | ICD-10-CM | POA: Insufficient documentation

## 2024-07-22 DIAGNOSIS — R0789 Other chest pain: Principal | ICD-10-CM

## 2024-07-22 DIAGNOSIS — I129 Hypertensive chronic kidney disease with stage 1 through stage 4 chronic kidney disease, or unspecified chronic kidney disease: Secondary | ICD-10-CM | POA: Diagnosis not present

## 2024-07-22 DIAGNOSIS — Z79899 Other long term (current) drug therapy: Secondary | ICD-10-CM | POA: Diagnosis not present

## 2024-07-22 DIAGNOSIS — I4891 Unspecified atrial fibrillation: Secondary | ICD-10-CM

## 2024-07-22 DIAGNOSIS — N1831 Chronic kidney disease, stage 3a: Secondary | ICD-10-CM | POA: Insufficient documentation

## 2024-07-22 DIAGNOSIS — Z955 Presence of coronary angioplasty implant and graft: Secondary | ICD-10-CM | POA: Insufficient documentation

## 2024-07-22 DIAGNOSIS — K5730 Diverticulosis of large intestine without perforation or abscess without bleeding: Secondary | ICD-10-CM | POA: Insufficient documentation

## 2024-07-22 DIAGNOSIS — D631 Anemia in chronic kidney disease: Secondary | ICD-10-CM | POA: Insufficient documentation

## 2024-07-22 DIAGNOSIS — I214 Non-ST elevation (NSTEMI) myocardial infarction: Secondary | ICD-10-CM | POA: Diagnosis not present

## 2024-07-22 DIAGNOSIS — I7121 Aneurysm of the ascending aorta, without rupture: Secondary | ICD-10-CM | POA: Diagnosis not present

## 2024-07-22 DIAGNOSIS — I200 Unstable angina: Secondary | ICD-10-CM | POA: Diagnosis not present

## 2024-07-22 DIAGNOSIS — D649 Anemia, unspecified: Secondary | ICD-10-CM | POA: Diagnosis present

## 2024-07-22 DIAGNOSIS — Z8719 Personal history of other diseases of the digestive system: Secondary | ICD-10-CM

## 2024-07-22 DIAGNOSIS — R072 Precordial pain: Principal | ICD-10-CM | POA: Insufficient documentation

## 2024-07-22 DIAGNOSIS — I480 Paroxysmal atrial fibrillation: Secondary | ICD-10-CM | POA: Diagnosis present

## 2024-07-22 DIAGNOSIS — Z8673 Personal history of transient ischemic attack (TIA), and cerebral infarction without residual deficits: Secondary | ICD-10-CM | POA: Diagnosis not present

## 2024-07-22 DIAGNOSIS — N183 Chronic kidney disease, stage 3 unspecified: Secondary | ICD-10-CM | POA: Diagnosis present

## 2024-07-22 LAB — CBC
HCT: 24.3 % — ABNORMAL LOW (ref 39.0–52.0)
Hemoglobin: 8.4 g/dL — ABNORMAL LOW (ref 13.0–17.0)
MCH: 35.6 pg — ABNORMAL HIGH (ref 26.0–34.0)
MCHC: 34.6 g/dL (ref 30.0–36.0)
MCV: 103 fL — ABNORMAL HIGH (ref 80.0–100.0)
Platelets: 188 K/uL (ref 150–400)
RBC: 2.36 MIL/uL — ABNORMAL LOW (ref 4.22–5.81)
RDW: 14.8 % (ref 11.5–15.5)
WBC: 8 K/uL (ref 4.0–10.5)
nRBC: 0 % (ref 0.0–0.2)

## 2024-07-22 LAB — URINALYSIS, ROUTINE W REFLEX MICROSCOPIC
Bilirubin Urine: NEGATIVE
Glucose, UA: NEGATIVE mg/dL
Hgb urine dipstick: NEGATIVE
Ketones, ur: NEGATIVE mg/dL
Leukocytes,Ua: NEGATIVE
Nitrite: NEGATIVE
Protein, ur: NEGATIVE mg/dL
Specific Gravity, Urine: 1.016 (ref 1.005–1.030)
pH: 5 (ref 5.0–8.0)

## 2024-07-22 LAB — BASIC METABOLIC PANEL WITH GFR
Anion gap: 10 (ref 5–15)
BUN: 32 mg/dL — ABNORMAL HIGH (ref 8–23)
CO2: 23 mmol/L (ref 22–32)
Calcium: 8.9 mg/dL (ref 8.9–10.3)
Chloride: 107 mmol/L (ref 98–111)
Creatinine, Ser: 1.2 mg/dL (ref 0.61–1.24)
GFR, Estimated: 57 mL/min — ABNORMAL LOW (ref >60)
Glucose, Bld: 122 mg/dL — ABNORMAL HIGH (ref 70–99)
Potassium: 3.8 mmol/L (ref 3.5–5.1)
Sodium: 140 mmol/L (ref 135–145)

## 2024-07-22 LAB — RESP PANEL BY RT-PCR (RSV, FLU A&B, COVID)  RVPGX2
Influenza A by PCR: NEGATIVE
Influenza B by PCR: NEGATIVE
Resp Syncytial Virus by PCR: NEGATIVE
SARS Coronavirus 2 by RT PCR: NEGATIVE

## 2024-07-22 LAB — TROPONIN T, HIGH SENSITIVITY
Troponin T High Sensitivity: 30 ng/L — ABNORMAL HIGH (ref 0–19)
Troponin T High Sensitivity: 32 ng/L — ABNORMAL HIGH (ref 0–19)

## 2024-07-22 LAB — PRO BRAIN NATRIURETIC PEPTIDE: Pro Brain Natriuretic Peptide: 726 pg/mL — ABNORMAL HIGH (ref <300.0)

## 2024-07-22 MED ORDER — IOHEXOL 300 MG/ML  SOLN
100.0000 mL | Freq: Once | INTRAMUSCULAR | Status: AC | PRN
  Administered 2024-07-22: 100 mL via INTRAVENOUS

## 2024-07-22 MED ORDER — METOPROLOL TARTRATE 25 MG PO TABS
25.0000 mg | ORAL_TABLET | Freq: Once | ORAL | Status: AC
  Administered 2024-07-23: 25 mg via ORAL
  Filled 2024-07-22: qty 1

## 2024-07-22 MED ORDER — METOPROLOL TARTRATE 5 MG/5ML IV SOLN
5.0000 mg | Freq: Once | INTRAVENOUS | Status: AC
  Administered 2024-07-23: 5 mg via INTRAVENOUS
  Filled 2024-07-22: qty 5

## 2024-07-22 MED ORDER — LACTATED RINGERS IV BOLUS
500.0000 mL | Freq: Once | INTRAVENOUS | Status: AC
  Administered 2024-07-22: 500 mL via INTRAVENOUS

## 2024-07-22 MED ORDER — METOPROLOL TARTRATE 5 MG/5ML IV SOLN
5.0000 mg | Freq: Once | INTRAVENOUS | Status: AC
  Administered 2024-07-22: 5 mg via INTRAVENOUS
  Filled 2024-07-22: qty 5

## 2024-07-22 NOTE — ED Notes (Signed)
 Assisted RN Leontine with pulling pt up in the bed.

## 2024-07-22 NOTE — ED Provider Notes (Signed)
 [facility] Provider Note    Event Date/Time   First MD Initiated Contact with Patient 07/22/24 1456     (approximate)   History   Chest Pain   HPI  William Taylor is a 89 y.o. male who presents to the ED for evaluation of Chest Pain   Review vascular surgery clinic visit from 2 months ago.  Incidental ascending thoracic aortic aneurysm, 4.4 cm.  History of CAD, PE, paroxysmal A-fib on Xarelto .  CKD 3.  In July he had left inguinal repair with mesh, postoperative surgical site infection shortly after.  Patient presents to the ED, accompanied by his wife, for evaluation of chest discomfort superimposed on 2 weeks of intermittent left inguinal pain.  Patient reports an episode of chest discomfort while getting up to go to the bathroom last night that self resolved, another episode this afternoon when he was gearing up to walk to the mailbox but due to this discomfort he came back inside and he and his wife present to the ED.  He reports discomfort waning but still present, improved after he received 324 of aspirin  with EMS.  Not improved with nitroglycerin  that he took at home.  Normal p.o. intake and toileting.  Reports chronic urinary frequency, voiding every 2 hours, without acute changes, no changes to his daily stools.   Physical Exam   Triage Vital Signs: ED Triage Vitals  Encounter Vitals Group     BP 07/22/24 1413 121/73     Girls Systolic BP Percentile --      Girls Diastolic BP Percentile --      Boys Systolic BP Percentile --      Boys Diastolic BP Percentile --      Pulse Rate 07/22/24 1413 (!) 115     Resp 07/22/24 1413 18     Temp 07/22/24 1413 98.1 F (36.7 C)     Temp Source 07/22/24 1413 Oral     SpO2 07/22/24 1413 98 %     Weight 07/22/24 1412 177 lb (80.3 kg)     Height 07/22/24 1412 6' 4 (1.93 m)     Head Circumference --      Peak Flow --      Pain Score 07/22/24 1412 2     Pain Loc --      Pain Education --       Exclude from Growth Chart --     Most recent vital signs: Vitals:   07/22/24 1830 07/22/24 2039  BP: (!) 148/79   Pulse: 96   Resp: 16   Temp:    SpO2: 100% 99%    General: Awake, no distress.  Hard of hearing, pleasant and conversational CV:  Good peripheral perfusion.  A-fib on the monitor, rate 70s-90s Resp:  Normal effort.  No wheezing Abd:  No distention.  Mild suprapubic and left inguinal tenderness, benign upper abdomen MSK:  No deformity noted.  Neuro:  No focal deficits appreciated. Other:     ED Results / Procedures / Treatments   Labs (all labs ordered are listed, but only abnormal results are displayed) Labs Reviewed  BASIC METABOLIC PANEL WITH GFR - Abnormal; Notable for the following components:      Result Value   Glucose, Bld 122 (*)    BUN 32 (*)    GFR, Estimated 57 (*)    All other components within normal limits  CBC - Abnormal; Notable for the following components:   RBC 2.36 (*)  Hemoglobin 8.4 (*)    HCT 24.3 (*)    MCV 103.0 (*)    MCH 35.6 (*)    All other components within normal limits  URINALYSIS, ROUTINE W REFLEX MICROSCOPIC - Abnormal; Notable for the following components:   Color, Urine YELLOW (*)    APPearance CLEAR (*)    All other components within normal limits  PRO BRAIN NATRIURETIC PEPTIDE - Abnormal; Notable for the following components:   Pro Brain Natriuretic Peptide 726.0 (*)    All other components within normal limits  TROPONIN T, HIGH SENSITIVITY - Abnormal; Notable for the following components:   Troponin T High Sensitivity 30 (*)    All other components within normal limits  TROPONIN T, HIGH SENSITIVITY - Abnormal; Notable for the following components:   Troponin T High Sensitivity 32 (*)    All other components within normal limits  RESP PANEL BY RT-PCR (RSV, FLU A&B, COVID)  [id number]  HEMOGLOBIN AND HEMATOCRIT, BLOOD    EKG Rapid A-fib with a rate of 117 bpm.  Normal axis and intervals without for signs of acute  ischemia.  RADIOLOGY CXR interpreted by me without evidence of acute cardiopulmonary pathology.  Official radiology report(s): CT ABDOMEN PELVIS W CONTRAST Result Date: 07/22/2024 CLINICAL DATA:  Provided history: left inguinal pain, recent hernia repair Electronic records indicates hernia repair 02/27/2024 EXAM: CT ABDOMEN AND PELVIS WITH CONTRAST TECHNIQUE: Multidetector CT imaging of the abdomen and pelvis was performed using the standard protocol following bolus administration of intravenous contrast. RADIATION DOSE REDUCTION: This exam was performed according to the departmental dose-optimization program which includes automated exposure control, adjustment of the mA and/or kV according to patient size and/or use of iterative reconstruction technique. CONTRAST:  OMNIPAQUE  IOHEXOL  300 MG/ML  SOLN COMPARISON:  CT 05/15/2024 FINDINGS: Lower chest: No basilar airspace disease or pleural effusion. Hepatobiliary: Mild diffuse hepatic steatosis. Tiny subcentimeter hypodensities are too small to characterize, but typically cysts. Layering gallstones within physiologically distended gallbladder. No pericholecystic inflammation. No biliary dilatation. Pancreas: Parenchymal atrophy. No ductal dilatation or inflammation. Spleen: Normal in size. Tiny hypodensity in the inferior spleen, of doubtful clinical significance. Scattered calcified granuloma. Adrenals/Urinary Tract: No adrenal nodule. No hydronephrosis, renal calculi, or suspicious renal lesion. No bladder wall thickening. Stomach/Bowel: Nondistended stomach. Equivocal gastric wall thickening versus nondistention. No small bowel obstruction or inflammation. Scattered small bowel fecalization. The appendix is normal. Colonic diverticulosis without diverticulitis. There also scattered small bowel diverticula. No colonic inflammation. Vascular/Lymphatic: Aortic and branch atherosclerosis. No aortic aneurysm. Patent portal vein. No suspicious  lymphadenopathy. Reproductive: Enlarged prostate spans 5.4 cm and causes mild mass effect on the bladder base. Other: Strandy soft tissue density in the left inguinal canal has slightly contracted from prior exam and is consistent with postoperative change. No recurrent hernia. No focal fluid collection. No abdominopelvic ascites. Musculoskeletal: Degenerative change in the spine. No acute fracture of the lumbar spine or hips. Unchanged alignment of right L3 transverse process fracture from prior with interval callus. Callus formation about right L4 transverse process consistent with healing subacute fracture. IMPRESSION: 1. Postoperative change in the left inguinal canal. No recurrent hernia. No focal fluid collection. 2. Equivocal gastric wall thickening versus nondistention. Recommend correlation for symptoms of gastritis. 3. Colonic and small bowel diverticulosis without diverticulitis. 4. Cholelithiasis without gallbladder inflammation. 5. Hepatic steatosis. 6. Enlarged prostate. Aortic Atherosclerosis (ICD10-I70.0). Electronically Signed   By: Andrea Gasman M.D.   On: 07/22/2024 20:07   DG Chest 2 View Result Date: 07/22/2024 CLINICAL  DATA:  Chest pain EXAM: DG CHEST 2V COMPARISON:  Chest x-ray 05/15/2024 FINDINGS: Heart is borderline enlarged. There is no focal lung infiltrate, pleural effusion or pneumothorax. No acute fractures are seen. IMPRESSION: Borderline cardiomegaly. No acute pulmonary process. Electronically Signed   By: Greig Pique M.D.   On: 07/22/2024 15:41    PROCEDURES and INTERVENTIONS:  .1-3 Lead EKG Interpretation  Performed by: Claudene Rover, MD Authorized by: Claudene Rover, MD     Interpretation: normal     ECG rate:  78   ECG rate assessment: normal     Rhythm: atrial fibrillation     Ectopy: none     Conduction: normal   .Critical Care  Performed by: Claudene Rover, MD Authorized by: Claudene Rover, MD   Critical care provider statement:    Critical care time  (minutes):  30   Critical care time was exclusive of:  Separately billable procedures and treating other patients   Critical care was necessary to treat or prevent imminent or life-threatening deterioration of the following conditions:  Cardiac failure and circulatory failure   Critical care was time spent personally by me on the following activities:  Development of treatment plan with patient or surrogate, discussions with consultants, evaluation of patient's response to treatment, examination of patient, ordering and review of laboratory studies, ordering and review of radiographic studies, ordering and performing treatments and interventions, pulse oximetry, re-evaluation of patient's condition and review of old charts   Medications  metoprolol  tartrate (LOPRESSOR ) tablet 25 mg (has no administration in time range)  metoprolol  tartrate (LOPRESSOR ) injection 5 mg (has no administration in time range)  lactated ringers bolus 500 mL (0 mLs Intravenous Stopped 07/22/24 1900)  metoprolol  tartrate (LOPRESSOR ) injection 5 mg (5 mg Intravenous Given 07/22/24 1758)  iohexol  (OMNIPAQUE ) 300 MG/ML solution 100 mL (100 mLs Intravenous Contrast Given 07/22/24 1924)     IMPRESSION / MDM / ASSESSMENT AND PLAN / ED COURSE  I reviewed the triage vital signs and the nursing notes.  Differential diagnosis includes, but is not limited to, ACS, PTX, PNA, muscle strain/spasm, PE, dissection, anxiety, pleural effusion, inguinal abscess, cranial hernia recurrence, blood loss anemia  {Patient presents with symptoms of an acute illness or injury that is potentially life-threatening.  Patient presents for evaluation of chest discomfort concerning for unstable angina.  Notably in A-fib, waxing and waning rapid rates, which may be contributing to his discomfort.  Has been compliant with his anticoagulation and I doubt PE.  Provide oral and IV metoprolol  with improved rates but has not yet converted chemically to sinus  rhythm by the time of this writing.  Blood work demonstrates normocytic anemia at 8.4, no bleeding symptoms.  He typically lives around a hemoglobin of 10.  Troponins mildly elevated and flat on repeat.  He does not appear grossly volume overloaded, minimally elevated BNP.  Clear CXR and nonacute CT abdomen.  Due to ongoing chest discomfort I consulted medicine for admission.  Attempted to trend hemoglobins but H&H repeat is recurrently hemolyzing, frustratingly.  Clinical Course as of 07/22/24 2324  Thu Jul 22, 2024  1717 2 weeks left inguinal pain.  Chest pain briefly last night walking to toilet, again today walking to mailbox. Feeling funny in the chest Still persists now to a lesser degree [DS]  1825 Review tele. Still in Afib, rates in the 80s [DS]  2245 Consult with medicine who agrees to admit [DS]    Clinical Course User Index [DS] Claudene Rover, MD  FINAL CLINICAL IMPRESSION(S) / ED DIAGNOSES   Final diagnoses:  Other chest pain  Unstable angina (HCC)  Atrial fibrillation with RVR (HCC)  Macrocytic anemia     Rx / DC Orders   ED Discharge Orders     None        Note:  This document was prepared using Dragon voice recognition software and may include unintentional dictation errors.   Claudene Rover, MD 07/22/24 2325

## 2024-07-22 NOTE — H&P (Signed)
 History and Physical    Patient: William Taylor FMW:[id number] DOB: 1930-04-10 DOA: 07/22/2024 DOS: the patient was seen and examined on 07/22/2024 PCP: [name], MD  Patient coming from: Home  Chief Complaint:  Chief Complaint  Patient presents with   Chest Pain    HPI: William Taylor is a 89 y.o. male with medical history significant for CAD s/p LAD stent(nonobstructive CAD on [facility] 2023), pAF on amiodarone  and Xarelto , prior pulmonary embolism (2006, 2012), HTN, orthostatic hypotension, chronic atypical chest pain, ascending aortic aneurysm, CKD stage IIIa, headaches/occipital neuralgia, history of left inguinal hernia repair with mesh July 2025 complicated by surgical site infection shortly thereafter, being admitted for chest pain workup/possible anginal/possible unstable angina.  He presents with 1 day of intermittent exertional chest pain,  culminating in a severe prolonged episode just prior to arrival, associated with fatigue, unrelieved with nitroglycerin .  He presented by EMS who administered aspirin  324 mg en route.  Of note, patient's last cath was in 2023 and showed nonobstructive CAD.  On his last office visit to cardiology in October 2024 he is noted to have a history of chronic chest pain dating back several years Separately, patient reports 2 weeks intermittent left inguinal pain at the site of his prior surgery.  He denies nausea or vomiting or diaphoresis.  Denies change in bowel habit or dysuria.  Denies cough, fever or chills.  Denies leg pain or swelling.  Denies black stool or blood in the stool In the ED, afebrile, in A-fib intermittently tachycardic to 115 with otherwise normal vitals. Labs notable for troponin 30->32 and proBNP of 726.  CBC notable for new anemia of 8.4 down from 10.3 but otherwise normal.  BMP unremarkable.  Respiratory viral panel negative and urinalysis sterile. EKG showed A-fib at 117 CT abdomen and pelvis with contrast for the most part  nonacute but recommending correlation for symptoms of gastritis.  Postoperative changes noted in the left inguinal canal.  Chest x-ray was nonacute and showed borderline cardiomegaly Patient was treated with 2 small LR boluses IV metoprolol  5 mg x 2 as well as oral metoprolol  Admission requested.   Past Medical History:  Diagnosis Date   Acquired dilation of ascending aorta and aortic root    Aortic atherosclerosis    Atypical chest pain    BPH (benign prostatic hyperplasia)    Cerebral microvascular disease    CKD (chronic kidney disease), stage III (HCC)    Colon polyp    Compression fracture of T2 vertebra (HCC) 02/26/2023   Coronary artery disease    a.) PCI LAD (Cypher DES) in 2005; b.)  LHC 2009: no obstructive CAD and patent stent; c.) PCI OM2 12/26/2010 (2.25 x 16 mm Promus Element); d.) LHC 10/28/2013: LAD 77m ISR, 56m, D1 90, D2 70, OM2 80 w/ patent stent;  e.) 06/2019 Low risk MV; f.) 07/2020 MV: EF 77%, no ischemia/infarct.   CVA (cerebral vascular accident) [facility])    a.) age indeterminate infarcts in the RIGHT cerebellum and LEFT parietal lobe   Depression    Dyspnea    Gall stones    GERD (gastroesophageal reflux disease)    Glaucoma    Hyperlipidemia    Hypertension    Hypertrophic nonobstructive cardiomyopathy (with severe asymmetric septal hypertrophy)    IDA (iron deficiency anemia)    Left inguinal hernia    Leg weakness    Long term current use of amiodarone     Melanoma (HCC)    Multinodular thyroid   NSTEMI (non-ST elevated myocardial infarction) (HCC) 10/26/2013   a.) experienced in setting of antithrombotic hold (clopidogrel ) for routine colonoscopy; b.) troponins trended: 1.00 --> 5.10 --> 6.20 ng/mL; c.) LHC 10/31/2013: 10/40% mLAD, 90/70% D1, 80% OM1 -- med mgmt   NSTEMI (non-ST elevated myocardial infarction) (HCC) 12/24/2010   a.) LHC 12/24/2010: 30 % LAD, 30% ISR mLAD, 50% dLAD, 95% OM2 --> d/t distal bifurcation lesion unable to see RCA --> transfer  to [facility] --> PCI 12/26/2010 placing a 2.25 x 16 mm Promus Element DES to OM2   On rivaroxaban  therapy    Orthostatic hypotension    a. prev on midodrine /florinef /northera .   PAF (paroxysmal atrial fibrillation) (HCC)    a.) CHA2DS2VASc = 6 (age x2, HTN, CVA/PE x 2, prior MI/vascular disease) as of 02/24/2024; b.) rate/rhythm maintained on oral amiodarone ; chronically anticoagulated with rivaroxaban    Prostate cancer (HCC)    PSVT (paroxysmal supraventricular tachycardia)    Pulmonary embolism (HCC) 2006   Skin cancer    Wears dentures    partial upper and lower   Wears hearing aid in both ears    Past Surgical History:  Procedure Laterality Date   CATARACT EXTRACTION Right 05/22/2011   CATARACT EXTRACTION W/PHACO Left 10/01/2023   Procedure: CATARACT EXTRACTION PHACO AND INTRAOCULAR LENS PLACEMENT (IOC) LEFT 23.44 01:56.2;  Surgeon: Mittie Gaskin, MD;  Location[facility];  Service: Ophthalmology;  Laterality: Left;   colonoscopy     COLONOSCOPY W/ POLYPECTOMY  08/19/2013   Dr Jeri   COLONOSCOPY WITH PROPOFOL  N/A 08/15/2016   Procedure: COLONOSCOPY WITH PROPOFOL ;  Surgeon: Ruel Kung, MD;  Location: [facility] ENDOSCOPY;  Service: Endoscopy;  Laterality: N/A;   CORONARY ANGIOPLASTY WITH STENT PLACEMENT Left 2005   Procedure: CORONARY ANGIOPLASTY WITH STENT PLACEMENT; Location[facility])   CORONARY ANGIOPLASTY WITH STENT PLACEMENT Left 12/26/2010   Procedure: CORONARY ANGIOPLASTY WITH STENT PLACEMENT; Location: [facility]; Surgeon: Peter Jordan, MD   ENTEROSCOPY N/A 09/22/2018   Procedure: ENTEROSCOPY;  Surgeon: Unk Corinn Skiff, MD;  Location: [facility] ENDOSCOPY;  Service: Gastroenterology;  Laterality: N/A;   ESOPHAGOGASTRODUODENOSCOPY (EGD) WITH PROPOFOL  N/A 08/15/2016   Procedure: ESOPHAGOGASTRODUODENOSCOPY (EGD) WITH PROPOFOL ;  Surgeon: Ruel Kung, MD;  Location: [facility] ENDOSCOPY;  Service: Endoscopy;  Laterality: N/A;   GIVENS CAPSULE STUDY N/A  09/26/2016   Procedure: GIVENS CAPSULE STUDY;  Surgeon: Ruel Kung, MD;  Location: [facility] ENDOSCOPY;  Service: Endoscopy;  Laterality: N/A;   HERNIA REPAIR  08/20/2011   INGUINAL HERNIA REPAIR Left 02/27/2024   Procedure: REPAIR, HERNIA, INGUINAL, ADULT, RECURRENT;  Surgeon: Marinda Jayson KIDD, MD;  Location: [facility] ORS;  Service: General;  Laterality: Left;  open procedure with mesh   LEFT HEART CATH AND CORONARY ANGIOGRAPHY N/A 04/29/2022   Procedure: LEFT HEART CATH AND CORONARY ANGIOGRAPHY;  Surgeon: Darron Deatrice LABOR, MD;  Location: [facility] INVASIVE CV LAB;  Service: Cardiovascular;  Laterality: N/A;   LEFT HEART CATH AND CORONARY ANGIOGRAPHY Left 2009   Procedure: LEFT HEART CATH AND CORONARY ANGIOGRAPHY; Location[facility])   LEFT HEART CATH AND CORONARY ANGIOGRAPHY Left 12/24/2010   Procedure: LEFT HEART CATH AND CORONARY ANGIOGRAPHY; Locaiton: [facility]; Surgeon: [name], MD   LEFT HEART CATH AND CORONARY ANGIOGRAPHY Left 10/28/2013   Procedure: LEFT HEART CATH AND CORONARY ANGIOGRAPHY; Location: [facility]; Surgeon: [name], MD   LUMBAR SPINE SURGERY     x2 in [facility] in [phone]   UPPER GI ENDOSCOPY  04/19/2014   Dr Jeri   Social History:  reports  that he has never smoked. He has never used smokeless tobacco. He reports that he does not drink alcohol and does not use drugs.  Allergies  Allergen Reactions   Bee Venom Swelling   Sulfasalazine Hives and Swelling   Warfarin     Chest pain    Family History  Problem Relation Age of Onset   Stroke Mother    Colon cancer Father    Coronary artery disease Brother    Other Brother 28       lymes dz    Prior to Admission medications   Medication Sig Start Date End Date Taking? Authorizing Provider  acetaminophen  (TYLENOL ) 500 MG tablet Take 500 mg by mouth every 6 (six) hours as needed for moderate pain (pain score 4-6).    [provider]  amiodarone  (PACERONE ) 100 MG tablet Take 1 tablet (100 mg total) by  mouth daily. 12/01/23   Gollan, Timothy J, MD  fenofibrate  micronized (ANTARA ) 130 MG capsule TAKE 1 CAPSULE BY MOUTH DAILY  BEFORE BREAKFAST 07/07/24   [name], MD  finasteride  (PROSCAR ) 5 MG tablet TAKE 1 TABLET BY MOUTH DAILY 07/06/24   Stoioff, Glendia BROCKS, MD  levobunolol  (BETAGAN ) 0.5 % ophthalmic solution Place 1 drop into the right eye 2 (two) times daily.    [provider]  loratadine  (CLARITIN ) 10 MG tablet Take 1 tablet (10 mg total) by mouth daily. 08/30/20   Maribeth Camellia MATSU, MD  Multiple Vitamin (MULTIVITAMIN PO) Take 1 tablet by mouth daily.    [provider]  nitroGLYCERIN  (NITROSTAT ) 0.4 MG SL tablet DISSOLVE UNDER THE TONGUE 1 TABLET EVERY 5 MINUTES AS NEEDED FOR CHEST PAIN 04/30/23   Maribeth Camellia MATSU, MD  pantoprazole  (PROTONIX ) 40 MG tablet TAKE ONE (1) TABLET BY MOUTH TWO TIMES PER DAY 06/12/23   Therisa Bi, MD  pravastatin  (PRAVACHOL ) 40 MG tablet Take 1 tablet (40 mg total) by mouth at bedtime. 11/18/23   [name], MD  XARELTO  15 MG TABS tablet TAKE 1 TABLET BY MOUTH DAILY 04/28/24   Gollan, Timothy J, MD    Physical Exam: Vitals:   07/22/24 1800 07/22/24 1808 07/22/24 1830 07/22/24 2039  BP: (!) 143/98  (!) 148/79   Pulse: 75  96   Resp: 16  16   Temp:  98.1 F (36.7 C)    TempSrc:  Oral    SpO2: 100%  100% 99%  Weight:      Height:       Physical Exam Vitals and nursing note reviewed.  Constitutional:      General: He is not in acute distress. HENT:     Head: Normocephalic and atraumatic.  Cardiovascular:     Rate and Rhythm: Normal rate and regular rhythm.     Heart sounds: Normal heart sounds.  Pulmonary:     Effort: Pulmonary effort is normal.     Breath sounds: Normal breath sounds.  Abdominal:     Palpations: Abdomen is soft.     Tenderness: There is no abdominal tenderness.  Neurological:     Mental Status: Mental status is at baseline.     Labs on Admission: I have personally reviewed following labs and  imaging studies  CBC: Recent Labs  Lab 07/22/24 1415  WBC 8.0  HGB 8.4*  HCT 24.3*  MCV 103.0*  PLT 188   Basic Metabolic Panel: Recent Labs  Lab 07/22/24 1415  NA 140  K 3.8  CL 107  CO2 23  GLUCOSE  122*  BUN 32*  CREATININE 1.20  CALCIUM 8.9   GFR: Estimated Creatinine Clearance: 44.6 mL/min (by C-G formula based on SCr of 1.2 mg/dL). Liver Function Tests: No results for input(s): AST, ALT, ALKPHOS, BILITOT, PROT, ALBUMIN in the last 168 hours. No results for input(s): LIPASE, AMYLASE in the last 168 hours. No results for input(s): AMMONIA in the last 168 hours. Coagulation Profile: No results for input(s): INR, PROTIME in the last 168 hours. Cardiac Enzymes: No results for input(s): CKTOTAL, CKMB, CKMBINDEX, TROPONINI in the last 168 hours. BNP (last 3 results) Recent Labs    07/22/24 1415  PROBNP 726.0*   HbA1C: No results for input(s): HGBA1C in the last 72 hours. CBG: No results for input(s): GLUCAP in the last 168 hours. Lipid Profile: No results for input(s): CHOL, HDL, LDLCALC, TRIG, CHOLHDL, LDLDIRECT in the last 72 hours. Thyroid  Function Tests: No results for input(s): TSH, T4TOTAL, FREET4, T3FREE, THYROIDAB in the last 72 hours. Anemia Panel: No results for input(s): VITAMINB12, FOLATE, FERRITIN, TIBC, IRON, RETICCTPCT in the last 72 hours. Urine analysis:    Component Value Date/Time   COLORURINE YELLOW (A) 07/22/2024 1810   APPEARANCEUR CLEAR (A) 07/22/2024 1810   APPEARANCEUR Hazy 02/27/2014 1335   LABSPEC 1.016 07/22/2024 1810   LABSPEC 1.017 02/27/2014 1335   PHURINE 5.0 07/22/2024 1810   GLUCOSEU NEGATIVE 07/22/2024 1810   GLUCOSEU Negative 02/27/2014 1335   HGBUR NEGATIVE 07/22/2024 1810   BILIRUBINUR NEGATIVE 07/22/2024 1810   BILIRUBINUR negative 03/28/2022 1122   BILIRUBINUR Negative 02/27/2014 1335   KETONESUR NEGATIVE 07/22/2024 1810   PROTEINUR NEGATIVE  07/22/2024 1810   UROBILINOGEN 1.0 03/28/2022 1122   NITRITE NEGATIVE 07/22/2024 1810   LEUKOCYTESUR NEGATIVE 07/22/2024 1810   LEUKOCYTESUR Negative 02/27/2014 1335    Radiological Exams on Admission: CT ABDOMEN PELVIS W CONTRAST Result Date: 07/22/2024 CLINICAL DATA:  Provided history: left inguinal pain, recent hernia repair Electronic records indicates hernia repair 02/27/2024 EXAM: CT ABDOMEN AND PELVIS WITH CONTRAST TECHNIQUE: Multidetector CT imaging of the abdomen and pelvis was performed using the standard protocol following bolus administration of intravenous contrast. RADIATION DOSE REDUCTION: This exam was performed according to the departmental dose-optimization program which includes automated exposure control, adjustment of the mA and/or kV according to patient size and/or use of iterative reconstruction technique. CONTRAST:  OMNIPAQUE  IOHEXOL  300 MG/ML  SOLN COMPARISON:  CT 05/15/2024 FINDINGS: Lower chest: No basilar airspace disease or pleural effusion. Hepatobiliary: Mild diffuse hepatic steatosis. Tiny subcentimeter hypodensities are too small to characterize, but typically cysts. Layering gallstones within physiologically distended gallbladder. No pericholecystic inflammation. No biliary dilatation. Pancreas: Parenchymal atrophy. No ductal dilatation or inflammation. Spleen: Normal in size. Tiny hypodensity in the inferior spleen, of doubtful clinical significance. Scattered calcified granuloma. Adrenals/Urinary Tract: No adrenal nodule. No hydronephrosis, renal calculi, or suspicious renal lesion. No bladder wall thickening. Stomach/Bowel: Nondistended stomach. Equivocal gastric wall thickening versus nondistention. No small bowel obstruction or inflammation. Scattered small bowel fecalization. The appendix is normal. Colonic diverticulosis without diverticulitis. There also scattered small bowel diverticula. No colonic inflammation. Vascular/Lymphatic: Aortic and branch  atherosclerosis. No aortic aneurysm. Patent portal vein. No suspicious lymphadenopathy. Reproductive: Enlarged prostate spans 5.4 cm and causes mild mass effect on the bladder base. Other: Strandy soft tissue density in the left inguinal canal has slightly contracted from prior exam and is consistent with postoperative change. No recurrent hernia. No focal fluid collection. No abdominopelvic ascites. Musculoskeletal: Degenerative change in the spine. No acute fracture of the lumbar spine  or hips. Unchanged alignment of right L3 transverse process fracture from prior with interval callus. Callus formation about right L4 transverse process consistent with healing subacute fracture. IMPRESSION: 1. Postoperative change in the left inguinal canal. No recurrent hernia. No focal fluid collection. 2. Equivocal gastric wall thickening versus nondistention. Recommend correlation for symptoms of gastritis. 3. Colonic and small bowel diverticulosis without diverticulitis. 4. Cholelithiasis without gallbladder inflammation. 5. Hepatic steatosis. 6. Enlarged prostate. Aortic Atherosclerosis (ICD10-I70.0). Electronically Signed   By: Andrea Gasman M.D.   On: 07/22/2024 20:07   DG Chest 2 View Result Date: 07/22/2024 CLINICAL DATA:  Chest pain EXAM: DG CHEST 2V COMPARISON:  Chest x-ray 05/15/2024 FINDINGS: Heart is borderline enlarged. There is no focal lung infiltrate, pleural effusion or pneumothorax. No acute fractures are seen. IMPRESSION: Borderline cardiomegaly. No acute pulmonary process. Electronically Signed   By: Greig Pique M.D.   On: 07/22/2024 15:41   Data Reviewed for HPI: Relevant notes from primary care and specialist visits, past discharge summaries as available in EHR, including Care Everywhere. Prior diagnostic testing as pertinent to current admission diagnoses Updated medications and problem lists for reconciliation ED course, including vitals, labs, imaging, treatment and response to  treatment Triage notes, nursing and pharmacy notes and ED provider's notes Notable results as noted above in HPI      Assessment and Plan: * Precordial chest pain, exertional (HCC) Possible symptomatic anemia Nonobstructive CAD, LHC 2023 Patient with typical chest pain but with flat troponins 30-> 32 and nonacute EKG Possible symptomatic anemia given new hemoglobin of 8.4 down from baseline of 10.3 CT abdomen and pelvis with recommendation to correlate for gastritis Nitroglycerin  sublingual as needed chest pain with morphine  for breakthrough Continue metoprolol , pravastatin  Please see management of symptomatic anemia on the respective problem  Symptomatic anemia, suspect chronic GI blood loss Gastritis/gastric [place] thickening [place] [address] 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 Therisa 05/2024.  Last colonoscopy 08/15/16- Cecal AVM seen which was ablated, diverticulosis, a polyp -adenoma was resected  No reported black or bloody stool Serial H&H and transfuse if needed Will get anemia panel Empiric IV Protonix  Given history of GI bleed, will consult GI and keep n.p.o. from midnight Holding Xarelto  tonight while hemoglobin is being monitored Addendum: Following admission patient passed urine and said it was pink-flushed it down.  Urinalysis done earlier showed no blood  History of pulmonary embolism ([id number]) Patient with history of PE x 2, second episode was after a cardiac cath and while on anticoagulation Will hold Xarelto  due to concern for possible occult chronic bleed If hemoglobin stable, can resume Xarelto  following GI consult in the a.m. Monitor respiratory status closely  H/O left inguinal hernia repair 02/27/2024 Intermittent left inguinal pain CT abdomen and pelvis with contrast showing expected post surgical changes with no acute issues As needed pain meds  Paroxysmal  atrial fibrillation (HCC) Holding Xarelto  due to concern for GI blood loss Continue metoprolol  and amiodarone , with close monitoring of blood pressure  CKD (chronic kidney disease), stage IIIa Renal function at baseline  Thoracic ascending aortic aneurysm No acute issues suspected    DVT prophylaxis: SCD  Consults: GI, cardiology  Advance Care Planning:   Code Status: Prior   Family Communication: none  Disposition Plan: Back to previous home environment  Severity of Illness: The appropriate patient status for this patient is OBSERVATION. Observation status is judged to be reasonable  and necessary in order to provide the required intensity of service to ensure the patient's safety. The patient's presenting symptoms, physical exam findings, and initial radiographic and laboratory data in the context of their medical condition is felt to place them at decreased risk for further clinical deterioration. Furthermore, it is anticipated that the patient will be medically stable for discharge from the hospital within 2 midnights of admission.   Author: Delayne LULLA Solian, MD 07/22/2024 10:58 PM  For on call review [email or web address].

## 2024-07-22 NOTE — Assessment & Plan Note (Addendum)
 Possible symptomatic anemia Nonobstructive CAD, LHC 2023 Patient with typical chest pain but with flat troponins 30-> 32 and nonacute EKG Possible symptomatic anemia given new hemoglobin of 8.4 down from baseline of 10.3 CT abdomen and pelvis with recommendation to correlate for gastritis Nitroglycerin  sublingual as needed chest pain with morphine  for breakthrough Continue metoprolol , pravastatin  Please see management of symptomatic anemia on the respective problem

## 2024-07-22 NOTE — ED Triage Notes (Signed)
 Patient states mid sternal chest pain that started around 12/12:30 today; took 2 SL NTG with no improvement. Reports he had 324mg  of ASA with EMS and reports that helped out a little.

## 2024-07-23 ENCOUNTER — Observation Stay
Admit: 2024-07-23 | Discharge: 2024-07-23 | Disposition: A | Discharge status: Home / Self Care | Attending: Internal Medicine | Admitting: Internal Medicine

## 2024-07-23 ENCOUNTER — Observation Stay

## 2024-07-23 DIAGNOSIS — R079 Chest pain, unspecified: Secondary | ICD-10-CM | POA: Diagnosis not present

## 2024-07-23 DIAGNOSIS — R0789 Other chest pain: Secondary | ICD-10-CM

## 2024-07-23 DIAGNOSIS — E785 Hyperlipidemia, unspecified: Secondary | ICD-10-CM

## 2024-07-23 DIAGNOSIS — I200 Unstable angina: Secondary | ICD-10-CM | POA: Diagnosis not present

## 2024-07-23 DIAGNOSIS — I480 Paroxysmal atrial fibrillation: Secondary | ICD-10-CM | POA: Diagnosis not present

## 2024-07-23 DIAGNOSIS — I214 Non-ST elevation (NSTEMI) myocardial infarction: Secondary | ICD-10-CM | POA: Diagnosis not present

## 2024-07-23 DIAGNOSIS — R1032 Left lower quadrant pain: Secondary | ICD-10-CM

## 2024-07-23 DIAGNOSIS — D539 Nutritional anemia, unspecified: Secondary | ICD-10-CM

## 2024-07-23 DIAGNOSIS — I25118 Atherosclerotic heart disease of native coronary artery with other forms of angina pectoris: Secondary | ICD-10-CM | POA: Diagnosis not present

## 2024-07-23 DIAGNOSIS — D5912 Cold autoimmune hemolytic anemia: Secondary | ICD-10-CM

## 2024-07-23 DIAGNOSIS — D649 Anemia, unspecified: Secondary | ICD-10-CM

## 2024-07-23 DIAGNOSIS — I4891 Unspecified atrial fibrillation: Secondary | ICD-10-CM

## 2024-07-23 LAB — NM MYOCAR MULTI W/SPECT W/WALL MOTION / EF
LV dias vol: 79 mL (ref 62–150)
LV sys vol: 27 mL (ref 4.2–5.8)
MPHR: 128 {beats}/min
Nuc Stress EF: 66 %
Peak HR: 81 {beats}/min
Percent HR: 63 %
Rest HR: 60 {beats}/min
Rest Nuclear Isotope Dose: 10.9 mCi
SDS: 0
SRS: 13
SSS: 11
ST Depression (mm): 0 mm
Stress Nuclear Isotope Dose: 29.9 mCi
TID: 1.08

## 2024-07-23 LAB — CBC
HCT: 24.8 % — ABNORMAL LOW (ref 39.0–52.0)
Hemoglobin: 9.2 g/dL — ABNORMAL LOW (ref 13.0–17.0)
MCH: 38.5 pg — ABNORMAL HIGH (ref 26.0–34.0)
MCHC: 37.1 g/dL — ABNORMAL HIGH (ref 30.0–36.0)
MCV: 103.8 fL — ABNORMAL HIGH (ref 80.0–100.0)
Platelets: 197 K/uL (ref 150–400)
RBC: 2.39 MIL/uL — ABNORMAL LOW (ref 4.22–5.81)
RDW: 14.6 % (ref 11.5–15.5)
WBC: 7.1 K/uL (ref 4.0–10.5)
nRBC: 0 % (ref 0.0–0.2)

## 2024-07-23 LAB — FERRITIN: Ferritin: 64 ng/mL (ref 24–336)

## 2024-07-23 LAB — ECHOCARDIOGRAM COMPLETE
AR max vel: 4.03 cm2
AV Area VTI: 5.04 cm2
AV Area mean vel: 4.54 cm2
AV Mean grad: 3 mmHg
AV Peak grad: 5.8 mmHg
Ao pk vel: 1.2 m/s
Area-P 1/2: 1.97 cm2
Height: 76 in
MV VTI: 4.78 cm2
S' Lateral: 2.9 cm
Weight: 2832 [oz_av]

## 2024-07-23 LAB — IRON AND TIBC
Iron: 65 ug/dL (ref 45–182)
Saturation Ratios: 18 % (ref 17.9–39.5)
TIBC: 370 ug/dL (ref 250–450)
UIBC: 304 ug/dL

## 2024-07-23 LAB — MAGNESIUM: Magnesium: 1.2 mg/dL — ABNORMAL LOW (ref 1.7–2.4)

## 2024-07-23 MED ORDER — PANTOPRAZOLE SODIUM 40 MG IV SOLR
40.0000 mg | INTRAVENOUS | Status: DC
  Administered 2024-07-23 – 2024-07-24 (×2): 40 mg via INTRAVENOUS
  Filled 2024-07-23 (×2): qty 10

## 2024-07-23 MED ORDER — MAGNESIUM SULFATE 2 GM/50ML IV SOLN
2.0000 g | Freq: Once | INTRAVENOUS | Status: AC
  Administered 2024-07-23: 2 g via INTRAVENOUS
  Filled 2024-07-23: qty 50

## 2024-07-23 MED ORDER — PRAVASTATIN SODIUM 40 MG PO TABS
40.0000 mg | ORAL_TABLET | Freq: Every day | ORAL | Status: DC
  Administered 2024-07-23 (×2): 40 mg via ORAL
  Filled 2024-07-23: qty 2
  Filled 2024-07-23: qty 1

## 2024-07-23 MED ORDER — TECHNETIUM TC 99M TETROFOSMIN IV KIT
10.0000 | PACK | Freq: Once | INTRAVENOUS | Status: AC | PRN
  Administered 2024-07-23: 10.91 via INTRAVENOUS

## 2024-07-23 MED ORDER — TECHNETIUM TC 99M TETROFOSMIN IV KIT
29.9200 | PACK | Freq: Once | INTRAVENOUS | Status: AC | PRN
  Administered 2024-07-23: 29.92 via INTRAVENOUS

## 2024-07-23 MED ORDER — ASPIRIN 81 MG PO TBEC
81.0000 mg | DELAYED_RELEASE_TABLET | Freq: Every day | ORAL | Status: DC
  Administered 2024-07-24: 81 mg via ORAL
  Filled 2024-07-23: qty 1

## 2024-07-23 MED ORDER — AMIODARONE HCL 200 MG PO TABS
100.0000 mg | ORAL_TABLET | Freq: Every day | ORAL | Status: DC
  Administered 2024-07-23 – 2024-07-24 (×2): 100 mg via ORAL
  Filled 2024-07-23 (×2): qty 1

## 2024-07-23 MED ORDER — ACETAMINOPHEN 325 MG PO TABS: 650.0000 mg | ORAL_TABLET | ORAL | Status: DC | PRN

## 2024-07-23 MED ORDER — NITROGLYCERIN 0.4 MG SL SUBL: 0.4000 mg | SUBLINGUAL_TABLET | SUBLINGUAL | Status: DC | PRN

## 2024-07-23 MED ORDER — FINASTERIDE 5 MG PO TABS
5.0000 mg | ORAL_TABLET | Freq: Every day | ORAL | Status: DC
  Administered 2024-07-23 – 2024-07-24 (×2): 5 mg via ORAL
  Filled 2024-07-23 (×2): qty 1

## 2024-07-23 MED ORDER — REGADENOSON 0.4 MG/5ML IV SOLN
0.4000 mg | Freq: Once | INTRAVENOUS | Status: AC
  Administered 2024-07-23: 0.4 mg via INTRAVENOUS

## 2024-07-23 MED ORDER — SODIUM CHLORIDE 0.9 % IV SOLN: INTRAVENOUS | Status: AC

## 2024-07-23 MED ORDER — ONDANSETRON HCL 4 MG/2ML IJ SOLN: 4.0000 mg | Freq: Four times a day (QID) | INTRAMUSCULAR | Status: DC | PRN

## 2024-07-23 NOTE — Consult Note (Signed)
 Patient ID: William Taylor, male   DOB: May 19, 1930, 89 y.o.   MRN: [id number] CC: Left Inguinal Hernia Pain History of Present Illness William Taylor is a 89 y.o. male with past medical history as below who presents in consultation for left inguinal pain.  The patient underwent a left open inguinal hernia repair in July this complicated by superficial surgical site infection.  He says that for several months he did well.  He said he did not have any pain and it was healing up well.  He recently started a movement routine and reports pain in his left groin.  He has a hard time describing it but says it is sharp in nature.  He says it does not radiate and that it is at 2 focal points.  He denies any bulge in his groin.  He denies any overlying skin changes..  Past Medical History Past Medical History:  Diagnosis Date   Acquired dilation of ascending aorta and aortic root    Aortic atherosclerosis    Atypical chest pain    BPH (benign prostatic hyperplasia)    Cerebral microvascular disease    CKD (chronic kidney disease), stage III (HCC)    Colon polyp    Compression fracture of T2 vertebra (HCC) 02/26/2023   Coronary artery disease    a.) PCI LAD (Cypher DES) in 2005; b.)  LHC 2009: no obstructive CAD and patent stent; c.) PCI OM2 12/26/2010 (2.25 x 16 mm Promus Element); d.) LHC 10/28/2013: LAD 30m ISR, 58m, D1 90, D2 70, OM2 80 w/ patent stent;  e.) 06/2019 Low risk MV; f.) 07/2020 MV: EF 77%, no ischemia/infarct.   CVA (cerebral vascular accident) [facility])    a.) age indeterminate infarcts in the RIGHT cerebellum and LEFT parietal lobe   Depression    Dyspnea    Gall stones    GERD (gastroesophageal reflux disease)    Glaucoma    Hyperlipidemia    Hypertension    Hypertrophic nonobstructive cardiomyopathy (with severe asymmetric septal hypertrophy)    IDA (iron deficiency anemia)    Left inguinal hernia    Leg weakness    Long term current use of amiodarone     Melanoma (HCC)     Multinodular thyroid     NSTEMI (non-ST elevated myocardial infarction) (HCC) 10/26/2013   a.) experienced in setting of antithrombotic hold (clopidogrel ) for routine colonoscopy; b.) troponins trended: 1.00 --> 5.10 --> 6.20 ng/mL; c.) LHC 10/31/2013: 10/40% mLAD, 90/70% D1, 80% OM1 -- med mgmt   NSTEMI (non-ST elevated myocardial infarction) (HCC) 12/24/2010   a.) LHC 12/24/2010: 30 % LAD, 30% ISR mLAD, 50% dLAD, 95% OM2 --> d/t distal bifurcation lesion unable to see RCA --> transfer to [facility] --> PCI 12/26/2010 placing a 2.25 x 16 mm Promus Element DES to OM2   On rivaroxaban  therapy    Orthostatic hypotension    a. prev on midodrine /florinef /northera .   PAF (paroxysmal atrial fibrillation) (HCC)    a.) CHA2DS2VASc = 6 (age x2, HTN, CVA/PE x 2, prior MI/vascular disease) as of 02/24/2024; b.) rate/rhythm maintained on oral amiodarone ; chronically anticoagulated with rivaroxaban    Prostate cancer (HCC)    PSVT (paroxysmal supraventricular tachycardia)    Pulmonary embolism (HCC) 2006   Skin cancer    Wears dentures    partial upper and lower   Wears hearing aid in both ears        Past Surgical History:  Procedure Laterality Date   CATARACT EXTRACTION Right 05/22/2011   CATARACT EXTRACTION  W/PHACO Left 10/01/2023   Procedure: CATARACT EXTRACTION PHACO AND INTRAOCULAR LENS PLACEMENT (IOC) LEFT 23.44 01:56.2;  Surgeon: Mittie Gaskin, MD;  Location[facility];  Service: Ophthalmology;  Laterality: Left;   colonoscopy     COLONOSCOPY W/ POLYPECTOMY  08/19/2013   Dr Jeri   COLONOSCOPY WITH PROPOFOL  N/A 08/15/2016   Procedure: COLONOSCOPY WITH PROPOFOL ;  Surgeon: Ruel Kung, MD;  Location[facility] END[facility]COPY;  Service: Endoscopy;  Laterality: N/A;   CORONARY ANGIOPLASTY WITH STENT PLACEMENT Left 2005   Procedure: CORONARY ANGIOPLASTY WITH STENT PLACEMENT; Location[facility])   CORONARY ANGIOPLASTY WITH STENT PLACEMENT Left 12/26/2010   Procedure: CORONARY  ANGIOPLASTY WITH STENT PLACEMENT; Location: [facility]; Surgeon: Peter Jordan, MD   ENTEROSCOPY N/A 09/22/2018   Procedure: ENTEROSCOPY;  Surgeon: Unk Corinn Skiff, MD;  Location: [facility] ENDOSCOPY;  Service: Gastroenterology;  Laterality: N/A;   ESOPHAGOGASTRODUODENOSCOPY (EGD) WITH PROPOFOL  N/A 08/15/2016   Procedure: ESOPHAGOGASTRODUODENOSCOPY (EGD) WITH PROPOFOL ;  Surgeon: Ruel Kung, MD;  Location: [facility] ENDOSCOPY;  Service: Endoscopy;  Laterality: N/A;   GIVENS CAPSULE STUDY N/A 09/26/2016   Procedure: GIVENS CAPSULE STUDY;  Surgeon: Ruel Kung, MD;  Location: [facility] ENDOSCOPY;  Service: Endoscopy;  Laterality: N/A;   HERNIA REPAIR  08/20/2011   INGUINAL HERNIA REPAIR Left 02/27/2024   Procedure: REPAIR, HERNIA, INGUINAL, ADULT, RECURRENT;  Surgeon: Marinda Jayson KIDD, MD;  Location: [facility] ORS;  Service: General;  Laterality: Left;  open procedure with mesh   LEFT HEART CATH AND CORONARY ANGIOGRAPHY N/A 04/29/2022   Procedure: LEFT HEART CATH AND CORONARY ANGIOGRAPHY;  Surgeon: Darron Deatrice LABOR, MD;  Location: [facility] INVASIVE CV LAB;  Service: Cardiovascular;  Laterality: N/A;   LEFT HEART CATH AND CORONARY ANGIOGRAPHY Left 2009   Procedure: LEFT HEART CATH AND CORONARY ANGIOGRAPHY; Location: [facility])   LEFT HEART CATH AND CORONARY ANGIOGRAPHY Left 12/24/2010   Procedure: LEFT HEART CATH AND CORONARY ANGIOGRAPHY; Locaiton: [facility]; Surgeon: [name], MD   LEFT HEART CATH AND CORONARY ANGIOGRAPHY Left 10/28/2013   Procedure: LEFT HEART CATH AND CORONARY ANGIOGRAPHY; Location: [facility]; Surgeon: [name], MD   LUMBAR SPINE SURGERY     x2 in [facility] in [phone]   UPPER GI ENDOSCOPY  04/19/2014   Dr Jeri    Allergies  Allergen Reactions   Bee Venom Swelling   Sulfasalazine Hives and Swelling   Warfarin     Chest pain    Current Facility-Administered Medications  Medication Dose Route Frequency Provider Last Rate Last Admin   acetaminophen  (TYLENOL ) tablet 650 mg  650 mg  Oral Q4H PRN Duncan, Hazel V, MD       amiodarone  (PACERONE ) tablet 100 mg  100 mg Oral Daily Duncan, Hazel V, MD   100 mg at 07/23/24 0948   [START ON 07/24/2024] aspirin  EC tablet 81 mg  81 mg Oral Daily Duncan, Hazel V, MD       finasteride  (PROSCAR ) tablet 5 mg  5 mg Oral Daily Duncan, Hazel V, MD   5 mg at 07/23/24 9050   nitroGLYCERIN  (NITROSTAT ) SL tablet 0.4 mg  0.4 mg Sublingual Q5 min PRN Cleatus Delayne GAILS, MD       ondansetron  (ZOFRAN ) injection 4 mg  4 mg Intravenous Q6H PRN Cleatus Delayne GAILS, MD       pantoprazole  (PROTONIX ) injection 40 mg  40 mg Intravenous Q24H Duncan, Hazel V, MD   40 mg at 07/23/24 9781   pravastatin  (PRAVACHOL ) tablet 40 mg  40 mg Oral QHS Cleatus Delayne  V, MD   40 mg at 07/23/24 0211    Family History Family History  Problem Relation Age of Onset   Stroke Mother    Colon cancer Father    Coronary artery disease Brother    Other Brother 84       lymes dz       Social History Social History   Tobacco Use   Smoking status: Never   Smokeless tobacco: Never  Vaping Use   Vaping status: Never Used  Substance Use Topics   Alcohol use: No   Drug use: No        ROS Full ROS of systems performed and is otherwise negative there than what is stated in the HPI  Physical Exam Blood pressure (!) 144/67, pulse 65, temperature 98 F (36.7 C), resp. rate 20, height 6' 4 (1.93 m), weight 80.3 kg, SpO2 98%.  Alert and oriented x 3, hard of hearing, moving all extremities spontaneously, regular rate and rhythm, mood and affect appropriate, left groin incision has healed well.  There is no bulge in the left groin.  He there is no bulge on hernia exam with Valsalva.  He does have some pain along the cord with palpation.  There are no skin coloration.  Data Reviewed I have independently reviewed his CT scan.  He does have some inflammatory changes which I would expect given that he had surgery there.  There is no evidence of recurrence of hernia.  I have  personally reviewed the patient's imaging and medical records.    Assessment    Patient with left groin pain status post hernia surgery.  The groin pain started for several months after the hernia surgery.  Plan    I am unsure what is causing this but it could be secondary to his new movement program and strain.  Certainly you have to think about nerve injury with hernia surgery but it would be odd to have pain that started 4 months after his surgery.  I do not see any evidence of recurrence.  At this time recommend symptomatic treatment with anti-inflammatories.  Recommend rest and ice as well.  If the pain persist he can follow-up outpatient to discuss possible nerve injections.  A total of 60 minutes was spent reviewing the patient's chart,  performing a history and physical and discussing treatment with the patient    Jayson MALVA Endow 07/23/2024, 3:30 PM

## 2024-07-23 NOTE — Assessment & Plan Note (Signed)
 No acute issues suspected

## 2024-07-23 NOTE — Assessment & Plan Note (Signed)
 -  Renal function  at baseline

## 2024-07-23 NOTE — Assessment & Plan Note (Addendum)
 Patient with history of PE x 2, second episode was after a cardiac cath and while on anticoagulation Will hold Xarelto  due to concern for possible occult chronic bleed If hemoglobin stable, can resume Xarelto  following GI consult in the a.m. Monitor respiratory status closely

## 2024-07-23 NOTE — Assessment & Plan Note (Addendum)
 Intermittent left inguinal pain CT abdomen and pelvis with contrast showing expected post surgical changes with no acute issues As needed pain meds

## 2024-07-23 NOTE — Assessment & Plan Note (Addendum)
 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 Therisa 05/2024.  Last colonoscopy 08/15/16- Cecal AVM seen which was ablated, diverticulosis, a polyp -adenoma was resected  No reported black or bloody stool Serial H&H and transfuse if needed Will get anemia panel Empiric IV Protonix  Given history of GI bleed, will consult GI and keep n.p.o. from midnight Holding Xarelto  tonight while hemoglobin is being monitored Addendum: Following admission patient passed urine and said it was pink-flushed it down.  Urinalysis done earlier showed no blood

## 2024-07-23 NOTE — Consult Note (Signed)
 William Copping, MD [facility]  [address] [place], [place] [address] Phone: [phone] Fax : [phone]  Consultation  Referring Provider:     Dr. Cleatus Primary Care Physician:  [name], MD Primary Gastroenterologist:  Dr. Therisa         Reason for Consultation:     Anemia  Date of Admission:  07/22/2024 Date of Consultation:  07/23/2024   HPI:   William Taylor is a 89 y.o. 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 Melanee.  The patient has been reported to have iron deficiency anemia with the patient's iron studies   Component     Latest Ref Rng 04/28/2022 6:27 AM 05/07/2023 9:36 AM  Iron     45 - 182 ug/dL 898  862   TIBC     749 - 450 ug/dL 555  522 (H)   Saturation Ratios     17.9 - 39.5 % 23  29   UIBC     ug/dL 656  659    The patient has undergone multiple endoscopic evaluations in the past including:  EGD and colonoscopy: 08/15/16 :gastric polyp with stigmata of recent bleed seen and resected hyperplastic on pathology report, normal duodenal biopsies.  10/03/16- capsule study showed no bleeding but a few small polyps were seen . 09/2017 : iron studies were normal .  11/2017 b12 and folate normal. Seen Dr Melanee Hematology - diagnosed with cold agglutinin hemolytic anemia Admitted 09/20/2018 with GI bleed , postural hypotension,melena .  09/22/2018 : Push enteroscopy -1 cm hiatal hernia.   The patient has a history of an ascending thoracic aortic aneurysm measuring 4.4 cm with coronary artery disease, PE, A-fib on Xarelto  and chronic kidney disease.  Earlier this year the patient had a left inguinal hernia repair with mesh with postoperative surgical site infection.  The patient now presented with chest discomfort while going to the bathroom that resolved on its own but then had another episode prior to coming to the emergency department while walking to the mailbox.   The patient also reported that he had some pink urine and had reported in the emergency department but had flushed the toilet prior to his being seen.  The patient was noted to have a hemoglobin of 8.4 down from his regular hemoglobin baseline with his most recent labs showing:  Component     Latest Ref Rng 03/03/2024 1:05 PM 05/07/2024 12:47 PM 05/15/2024 7:37 PM 07/22/2024 2:15 PM  Hemoglobin     13.0 - 17.0 g/dL 9.8 (L)  89.9 (L)  89.6 (L)  8.4 (L)   HCT     39.0 - 52.0 % 30.2 (L)  30.3 (L)  31.0 (L)  24.3 (L)   MCV     80.0 - 100.0 fL 102.0 (H)  102.0 (H)  101.3 (H)  103.0 (H)    Patient denied having any black stools or seeing blood in his stool.  The patient had a CT scan of the abdomen that was reported to show Equivocal gastric wall thickening versus nondistention. Recommend correlation for symptoms of gastritis. The patient was also seen by Dr. 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  I am now being asked to see the patient for a drop in his hemoglobin.  Past Medical History:  Diagnosis Date  Acquired dilation of ascending aorta and aortic root    Aortic atherosclerosis    Atypical chest pain    BPH (benign prostatic hyperplasia)    Cerebral microvascular disease    CKD (chronic kidney disease), stage III (HCC)    Colon polyp    Compression fracture of T2 vertebra (HCC) 02/26/2023   Coronary artery disease    a.) PCI LAD (Cypher DES) in 2005; b.)  LHC 2009: no obstructive CAD and patent stent; c.) PCI OM2 12/26/2010 (2.25 x 16 mm Promus Element); d.) LHC 10/28/2013: LAD 37m ISR, 58m, D1 90, D2 70, OM2 80 w/ patent stent;  e.) 06/2019 Low risk MV; f.) 07/2020 MV: EF 77%, no ischemia/infarct.   CVA (cerebral vascular accident) [facility])    a.) age indeterminate infarcts in the RIGHT cerebellum and LEFT parietal lobe   Depression    Dyspnea    Gall stones    GERD (gastroesophageal reflux disease)     Glaucoma    Hyperlipidemia    Hypertension    Hypertrophic nonobstructive cardiomyopathy (with severe asymmetric septal hypertrophy)    IDA (iron deficiency anemia)    Left inguinal hernia    Leg weakness    Long term current use of amiodarone     Melanoma (HCC)    Multinodular thyroid     NSTEMI (non-ST elevated myocardial infarction) (HCC) 10/26/2013   a.) experienced in setting of antithrombotic hold (clopidogrel ) for routine colonoscopy; b.) troponins trended: 1.00 --> 5.10 --> 6.20 ng/mL; c.) LHC 10/31/2013: 10/40% mLAD, 90/70% D1, 80% OM1 -- med mgmt   NSTEMI (non-ST elevated myocardial infarction) (HCC) 12/24/2010   a.) LHC 12/24/2010: 30 % LAD, 30% ISR mLAD, 50% dLAD, 95% OM2 --> d/t distal bifurcation lesion unable to see RCA --> transfer to [facility] --> PCI 12/26/2010 placing a 2.25 x 16 mm Promus Element DES to OM2   On rivaroxaban  therapy    Orthostatic hypotension    a. prev on midodrine /florinef /northera .   PAF (paroxysmal atrial fibrillation) (HCC)    a.) CHA2DS2VASc = 6 (age x2, HTN, CVA/PE x 2, prior MI/vascular disease) as of 02/24/2024; b.) rate/rhythm maintained on oral amiodarone ; chronically anticoagulated with rivaroxaban    Prostate cancer (HCC)    PSVT (paroxysmal supraventricular tachycardia)    Pulmonary embolism (HCC) 2006   Skin cancer    Wears dentures    partial upper and lower   Wears hearing aid in both ears     Past Surgical History:  Procedure Laterality Date   CATARACT EXTRACTION Right 05/22/2011   CATARACT EXTRACTION W/PHACO Left 10/01/2023   Procedure: CATARACT EXTRACTION PHACO AND INTRAOCULAR LENS PLACEMENT (IOC) LEFT 23.44 01:56.2;  Surgeon: Mittie Gaskin, MD;  Location: [facility];  Service: Ophthalmology;  Laterality: Left;   colonoscopy     COLONOSCOPY W/ POLYPECTOMY  08/19/2013   Dr Jeri   COLONOSCOPY WITH PROPOFOL  N/A 08/15/2016   Procedure: COLONOSCOPY WITH PROPOFOL ;  Surgeon: Ruel Kung, MD;  Location: [facility] ENDOSCOPY;   Service: Endoscopy;  Laterality: N/A;   CORONARY ANGIOPLASTY WITH STENT PLACEMENT Left 2005   Procedure: CORONARY ANGIOPLASTY WITH STENT PLACEMENT; Location: [facility])   CORONARY ANGIOPLASTY WITH STENT PLACEMENT Left 12/26/2010   Procedure: CORONARY ANGIOPLASTY WITH STENT PLACEMENT; Location: [facility]; Surgeon: Peter Jordan, MD   ENTEROSCOPY N/A 09/22/2018   Procedure: ENTEROSCOPY;  Surgeon: Unk Corinn Skiff, MD;  Location: [facility] ENDOSCOPY;  Service: Gastroenterology;  Laterality: N/A;   ESOPHAGOGASTRODUODENOSCOPY (EGD) WITH PROPOFOL  N/A 08/15/2016   Procedure: ESOPHAGOGASTRODUODENOSCOPY (EGD) WITH PROPOFOL ;  Surgeon: Ruel Kung, MD;  Location: [facility] ENDOSCOPY;  Service: Endoscopy;  Laterality: N/A;   GIVENS CAPSULE STUDY N/A 09/26/2016   Procedure: GIVENS CAPSULE STUDY;  Surgeon: Ruel Kung, MD;  Location: [facility] ENDOSCOPY;  Service: Endoscopy;  Laterality: N/A;   HERNIA REPAIR  08/20/2011   INGUINAL HERNIA REPAIR Left 02/27/2024   Procedure: REPAIR, HERNIA, INGUINAL, ADULT, RECURRENT;  Surgeon: Marinda Jayson KIDD, MD;  Location: [facility] ORS;  Service: General;  Laterality: Left;  open procedure with mesh   LEFT HEART CATH AND CORONARY ANGIOGRAPHY N/A 04/29/2022   Procedure: LEFT HEART CATH AND CORONARY ANGIOGRAPHY;  Surgeon: Darron Deatrice LABOR, MD;  Location: [facility] INVASIVE CV LAB;  Service: Cardiovascular;  Laterality: N/A;   LEFT HEART CATH AND CORONARY ANGIOGRAPHY Left 2009   Procedure: LEFT HEART CATH AND CORONARY ANGIOGRAPHY; Location: [facility])   LEFT HEART CATH AND CORONARY ANGIOGRAPHY Left 12/24/2010   Procedure: LEFT HEART CATH AND CORONARY ANGIOGRAPHY; Locaiton: [facility]; Surgeon: [name], MD   LEFT HEART CATH AND CORONARY ANGIOGRAPHY Left 10/28/2013   Procedure: LEFT HEART CATH AND CORONARY ANGIOGRAPHY; Location: [facility]; Surgeon: [name], MD   LUMBAR SPINE SURGERY     x2 in [facility] in [phone]   UPPER GI ENDOSCOPY  04/19/2014   Dr Jeri    Prior  to Admission medications   Medication Sig Start Date End Date Taking? Authorizing Provider  acetaminophen  (TYLENOL ) 500 MG tablet Take 500 mg by mouth every 6 (six) hours as needed for moderate pain (pain score 4-6).   Yes [provider]  amiodarone  (PACERONE ) 100 MG tablet Take 1 tablet (100 mg total) by mouth daily. 12/01/23  Yes Gollan, Timothy J, MD  fenofibrate  micronized (ANTARA ) 130 MG capsule TAKE 1 CAPSULE BY MOUTH DAILY  BEFORE BREAKFAST 07/07/24  Yes [name], MD  finasteride  (PROSCAR ) 5 MG tablet TAKE 1 TABLET BY MOUTH DAILY 07/06/24  Yes Stoioff, Glendia BROCKS, MD  levobunolol  (BETAGAN ) 0.5 % ophthalmic solution Place 1 drop into the right eye daily.   Yes [provider]  loratadine  (CLARITIN ) 10 MG tablet Take 1 tablet (10 mg total) by mouth daily. Patient taking differently: Take 10 mg by mouth daily as needed for rhinitis or allergies. 08/30/20  Yes Maribeth Camellia MATSU, MD  Multiple Vitamin (MULTIVITAMIN PO) Take 1 tablet by mouth daily.   Yes [provider]  nitroGLYCERIN  (NITROSTAT ) 0.4 MG SL tablet DISSOLVE UNDER THE TONGUE 1 TABLET EVERY 5 MINUTES AS NEEDED FOR CHEST PAIN 04/30/23  Yes Maribeth Camellia MATSU, MD  pantoprazole  (PROTONIX ) 40 MG tablet TAKE ONE (1) TABLET BY MOUTH TWO TIMES PER DAY 06/12/23  Yes Kung Ruel, MD  pravastatin  (PRAVACHOL ) 40 MG tablet Take 1 tablet (40 mg total) by mouth at bedtime. 11/18/23  Yes [name], MD  XARELTO  15 MG TABS tablet TAKE 1 TABLET BY MOUTH DAILY 04/28/24  Yes Gollan, Timothy J, MD    Family History  Problem Relation Age of Onset   Stroke Mother    Colon cancer Father    Coronary artery disease Brother    Other Brother 38       lymes dz     Social History   Tobacco Use   Smoking status: Never   Smokeless tobacco: Never  Vaping Use   Vaping status: Never Used  Substance Use Topics   Alcohol use: No   Drug use: No    Allergies as of 07/22/2024 - Review Complete 07/22/2024  Allergen  Reaction Noted  Bee venom Swelling 07/17/2015   Sulfasalazine Hives and Swelling 01/22/2016   Warfarin  06/12/2021    Review of Systems:    All systems reviewed and negative except where noted in HPI.   Physical Exam:  Vital signs in last 24 hours: Temp:  [97.9 F (36.6 C)-98.1 F (36.7 C)] 97.9 F (36.6 C) (12/05 0514) Pulse Rate:  [57-115] 60 (12/05 0630) Resp:  [12-23] 20 (12/05 0630) BP: (99-175)/(49-128) 156/79 (12/05 0630) SpO2:  [98 %-100 %] 100 % (12/05 0630) Weight:  [80.3 kg] 80.3 kg (12/04 1412)   General:   Pleasant, cooperative in NAD Head:  Normocephalic and atraumatic. Eyes:   No icterus.   Conjunctiva pink. PERRLA. Ears:  Normal auditory acuity. Rectal:  Not performed. Msk:  Symmetrical without gross deformities.    Neurologic:  Alert and oriented x3;  grossly normal neurologically. Skin:  Intact without significant lesions or rashes. Psych:  Alert and cooperative. Normal affect.  LAB RESULTS: Recent Labs    07/22/24 1415  WBC 8.0  HGB 8.4*  HCT 24.3*  PLT 188   BMET Recent Labs    07/22/24 1415  NA 140  K 3.8  CL 107  CO2 23  GLUCOSE 122*  BUN 32*  CREATININE 1.20  CALCIUM 8.9   LFT No results for input(s): PROT, ALBUMIN, AST, ALT, ALKPHOS, BILITOT, BILIDIR, IBILI in the last 72 hours. PT/INR No results for input(s): LABPROT, INR in the last 72 hours.  STUDIES: CT ABDOMEN PELVIS W CONTRAST Result Date: 07/22/2024 CLINICAL DATA:  Provided history: left inguinal pain, recent hernia repair Electronic records indicates hernia repair 02/27/2024 EXAM: CT ABDOMEN AND PELVIS WITH CONTRAST TECHNIQUE: Multidetector CT imaging of the abdomen and pelvis was performed using the standard protocol following bolus administration of intravenous contrast. RADIATION DOSE REDUCTION: This exam was performed according to the departmental dose-optimization program which includes automated exposure control, adjustment of the mA and/or kV  according to patient size and/or use of iterative reconstruction technique. CONTRAST:  OMNIPAQUE  IOHEXOL  300 MG/ML  SOLN COMPARISON:  CT 05/15/2024 FINDINGS: Lower chest: No basilar airspace disease or pleural effusion. Hepatobiliary: Mild diffuse hepatic steatosis. Tiny subcentimeter hypodensities are too small to characterize, but typically cysts. Layering gallstones within physiologically distended gallbladder. No pericholecystic inflammation. No biliary dilatation. Pancreas: Parenchymal atrophy. No ductal dilatation or inflammation. Spleen: Normal in size. Tiny hypodensity in the inferior spleen, of doubtful clinical significance. Scattered calcified granuloma. Adrenals/Urinary Tract: No adrenal nodule. No hydronephrosis, renal calculi, or suspicious renal lesion. No bladder wall thickening. Stomach/Bowel: Nondistended stomach. Equivocal gastric wall thickening versus nondistention. No small bowel obstruction or inflammation. Scattered small bowel fecalization. The appendix is normal. Colonic diverticulosis without diverticulitis. There also scattered small bowel diverticula. No colonic inflammation. Vascular/Lymphatic: Aortic and branch atherosclerosis. No aortic aneurysm. Patent portal vein. No suspicious lymphadenopathy. Reproductive: Enlarged prostate spans 5.4 cm and causes mild mass effect on the bladder base. Other: Strandy soft tissue density in the left inguinal canal has slightly contracted from prior exam and is consistent with postoperative change. No recurrent hernia. No focal fluid collection. No abdominopelvic ascites. Musculoskeletal: Degenerative change in the spine. No acute fracture of the lumbar spine or hips. Unchanged alignment of right L3 transverse process fracture from prior with interval callus. Callus formation about right L4 transverse process consistent with healing subacute fracture. IMPRESSION: 1. Postoperative change in the left inguinal canal. No recurrent hernia. No focal  fluid collection. 2. Equivocal gastric wall thickening versus nondistention. Recommend correlation for symptoms of  gastritis. 3. Colonic and small bowel diverticulosis without diverticulitis. 4. Cholelithiasis without gallbladder inflammation. 5. Hepatic steatosis. 6. Enlarged prostate. Aortic Atherosclerosis (ICD10-I70.0). Electronically Signed   By: Andrea Gasman M.D.   On: 07/22/2024 20:07   DG Chest 2 View Result Date: 07/22/2024 CLINICAL DATA:  Chest pain EXAM: DG CHEST 2V COMPARISON:  Chest x-ray 05/15/2024 FINDINGS: Heart is borderline enlarged. There is no focal lung infiltrate, pleural effusion or pneumothorax. No acute fractures are seen. IMPRESSION: Borderline cardiomegaly. No acute pulmonary process. Electronically Signed   By: Greig Pique M.D.   On: 07/22/2024 15:41      Impression / Plan:   Assessment: Principal Problem:   Precordial chest pain, exertional (HCC) Active Problems:   Paroxysmal atrial fibrillation (HCC)   History of pulmonary embolism (7993,7987)   CKD (chronic kidney disease), stage IIIa   Symptomatic anemia, suspect chronic GI blood loss   H/O left inguinal hernia repair 02/27/2024   Thoracic ascending aortic aneurysm   GORDON VANDUNK is a 89 y.o. 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  The patient now has had a drop in his hemoglobin with chest pain on exertion.  There is no report of any visible sign of bleeding such as black stools or bloody stools.  The patient also had his Xarelto  held.  Plan:  Normocytic anemia secondary to cold agglutinin. I have ordered a haptoglobin as part of this patient's anemia workup. Acute drop in hemoglobin I have also ordered this patient repeat iron studies this morning with the results being a normal iron. The patient's hemoglobin came back close to his baseline at 9.2 this morning. The patient can follow-up  with Dr. Therisa for a GI workup if there is any further sign of GI bleeding as an outpatient.  Although 2 of his hemoglobins were above 10 last month prior readings were between 9.5 and 9.8.  Thank you for involving me in the care of this patient.      LOS: 0 days   William Copping, MD, MD. NOLIA 07/23/2024, 7:16 AM,  Pager [phone] 7am-5pm  Check AMION for 5pm -7am coverage and on weekends   Note: This dictation was prepared with Dragon dictation along with smaller phrase technology. Any transcriptional errors that result from this process are unintentional.

## 2024-07-23 NOTE — Progress Notes (Signed)
*  PRELIMINARY RESULTS* Echocardiogram 2D Echocardiogram has been performed.  Floydene Harder 07/23/2024, 9:31 AM

## 2024-07-23 NOTE — Assessment & Plan Note (Signed)
 Holding Xarelto  due to concern for GI blood loss Continue metoprolol  and amiodarone , with close monitoring of blood pressure

## 2024-07-23 NOTE — Progress Notes (Signed)
 Pt admitted to floor. Bed alarm on, educated on how to call staff for assistance. Call bell in reach. Bed locked and low. Pt placed on tele box 7, 2 RN verified skin and tele. Pt's wife at bedside. Pt has gold colored wedding ring, glasses and dentures up and down. Pt's wife states his hearing aids are in his bag but need to be charged. States she will leave cell phone for patient. No charger as she states she forgot it. Pt states he is 6'4 and bed weight is 77.3kg. Pt is alert and oriented x4, states he lives at home with his wife and denies needs at home. Denies questions or needs at this time. NAD.

## 2024-07-23 NOTE — TOC CM/SW Note (Signed)
 Transition of Care [facility]) CM/SW Note    Transition of Care [facility]) - Inpatient Brief Assessment   Patient Details  Name: William Taylor MRN: [id number] Date of Birth: Feb 22, 1930  Transition of [facility]) CM/SW Contact:    Alfonso Rummer, LCSW Phone Number: 07/23/2024, 3:10 PM   Clinical Narrative: Toc chart review completed. Pt lives with spouse. PCP is Arlyss Solian MD. [facility] uses [facility] in [place]. No current dme/home health orders at this time. Please contact toc should needs arise.    Transition of Care Asessment: Insurance and Status: Insurance coverage has been reviewed Patient has primary care physician: Yes MAYER, GRAHAM S) Home environment has been reviewed: single family home Prior level of function:: Lives with spouse Prior/Current Home Services: Current home services   Readmission risk has been reviewed: No Transition of care needs: no transition of care needs at this time

## 2024-07-23 NOTE — Progress Notes (Signed)
 Progress Note    William Taylor  FMW:[id number] DOB: [birth year]  DOA: 07/22/2024 PCP: [name], MD      Brief Narrative:    Medical records reviewed and are as summarized below:  William Taylor is a 89 y.o. male  with medical history significant for CAD s/p LAD stent(nonobstructive CAD on [facility] 2023), pAF on amiodarone  and Xarelto , prior pulmonary embolism (2006, 2012), HTN, orthostatic hypotension, chronic atypical chest pain, ascending aortic aneurysm, CKD stage IIIa, headaches/occipital neuralgia, history of left inguinal hernia repair with mesh July 2025 complicated by surgical site infection shortly thereafter.  He presented to the hospital with chest pain.  He said he was walking to the mailbox when he noticed he had difficulty walking because of the chest pain.  He took 2 tablets of nitroglycerin  without any relief.  He said he also felt short of breath and tired so he said I am always short of breath.  EMS was called and he was given 4 tablets of baby aspirin  and route to the ED. Of note, patient's last cath was in 2023 and showed nonobstructive CAD. On his last office visit to cardiology in October 2024 he is noted to have a history of chronic chest pain dating back several years   He also complained of pain in the left inguinal area.  He had left inguinal hernia surgery in July 2025 which he said was complicated by infection and had to be kept overnight after surgery.  He said he had been doing well until recently when he noticed pain in the left groin.  He said in the ED, the left groin area was very tender to palpation.   Vitals in the ED: Temperature 98.1, respiratory rate 18, pulse 115, BP 121/73, O2 sat 98% on room air.    EKG showed atrial fibrillation with RVR  Troponins 30, 32 proBNP 726   Assessment/Plan:   Principal Problem:   Precordial chest pain, exertional (HCC) Active Problems:   Symptomatic anemia, suspect chronic GI blood loss   History  of pulmonary embolism ([id number])   H/O left inguinal hernia repair 02/27/2024   Paroxysmal atrial fibrillation (HCC)   CKD (chronic kidney disease), stage IIIa   Coronary artery disease of native artery of native heart with stable angina pectoris   Non-ST elevation (NSTEMI) myocardial infarction [facility])   Thoracic ascending aortic aneurysm   Macrocytic anemia   Rapid atrial fibrillation (HCC)    Body mass index is 21.55 kg/m.   Chest pain, mildly elevated troponins, CAD s/p prior PCI: Troponins are flat.  No evidence of ACS.  Nuclear stress test was negative for ischemia, LV perfusion normal and the study was reported as a low risk with normal LV function and nuclear stress EF 66%. 2D echo showed EF estimated at 60 to 65%, grade 1 diastolic dysfunction, moderately dilated left atrium, aortic dilation and mild dilation of the ascending aorta. Continue aspirin  and pravastatin . Follow-up with cardiologist for further recommendations.   Paroxysmal atrial fibrillation with RVR: Heart rate is better.  Continue amiodarone .  He was on Xarelto  prior to admission but this has been held because of recent ? rectal bleeding.   ?  One episode of rectal bleeding: He said he used the commode this morning and saw some blood in the commode but he was not sure whether the blood was coming from the urine or from the rectum. He was evaluated by Dr. Jinny, gastroenterologist.  No plan for inpatient  endoscopic workup.  Outpatient follow-up with Dr. Therisa, gastroenterologist, recommended.   Acute on chronic anemia: H&H stable.  Hemoglobin up from 8.4-9.2 without any blood transfusion.  Monitor H&H.   Left inguinal pain, history of left inguinal hernia surgery in July 2025: CT abdomen and pelvis did not show any recurrence of hernia or focal fluid collection in inguinal canal.  However, patient is convinced that there is something wrong and requested evaluation by the surgeon.  Consulted Dr. Marinda, general  surgeon.   Comorbidities include history of pulmonary embolism (2016 2012), CKD stage IIIa, BPH, history of stroke, hypertension, hyperlipidemia, advanced age, history of prostate cancer   Plan discussed extensively with the patient and his wife at the bedside.   Diet Order             Diet Heart Room service appropriate? Yes; Fluid consistency: Thin  Diet effective now                                  Consultants: Cardiologist Gastroenterology General Surgeon  Procedures: Nuclear stress test    Medications:    amiodarone   100 mg Oral Daily   [START ON 07/24/2024] aspirin  EC  81 mg Oral Daily   finasteride   5 mg Oral Daily   pantoprazole  (PROTONIX ) IV  40 mg Intravenous Q24H   pravastatin   40 mg Oral QHS   Continuous Infusions:   Anti-infectives (From admission, onward)    None              Family Communication/Anticipated D/C date and plan/Code Status   DVT prophylaxis: SCDs Start: 07/23/24 0111     Code Status: Full Code  Family Communication: Plan discussed with his wife at the bedside Disposition Plan: Plan to discharge home   Status is: Observation The patient will require care spanning > 2 midnights and should be moved to inpatient because: Chest pain, left groin pain       Subjective:   Interval events noted.  Chest pain has improved.  However, he said he had some chest pain during the stress test.  Currently, he complains of pain in the left groin.  He had left inguinal hernia surgery in July 2025 which was complicated by infection.  He was kept overnight in the hospital after surgery.  He had been doing well until yesterday when he noticed significant pain in the left groin.  He said it just popped up.  Objective:    Vitals:   07/23/24 0600 07/23/24 0630 07/23/24 0900 07/23/24 1323  BP: 139/71 (!) 156/79 (!) 163/88 (!) 163/93  Pulse: 61 60 65 69  Resp: 18 20 17 20   Temp:   97.8 F (36.6 C) 97.6 F (36.4  C)  TempSrc:   Oral Oral  SpO2: 100% 100% 100% 100%  Weight:      Height:       No data found.   Intake/Output Summary (Last 24 hours) at 07/23/2024 1400 Last data filed at 07/23/2024 1354 Gross per 24 hour  Intake 408.92 ml  Output --  Net 408.92 ml   Filed Weights   07/22/24 1412  Weight: 80.3 kg    Exam:  GEN: NAD SKIN: No rash EYES: No pallor or icterus ENT: MMM CV: Irregular rate and rhythm PULM: CTA B ABD: soft, ND, NT, +BS, left inguinal tenderness but no swelling or erythema.  Left inguinal scar noted. CNS: AAO x 3, non focal  EXT: No edema or tenderness        Data Reviewed:   I have personally reviewed following labs and imaging studies:  Labs: Labs show the following:   Basic Metabolic Panel: Recent Labs  Lab 07/22/24 1415  NA 140  K 3.8  CL 107  CO2 23  GLUCOSE 122*  BUN 32*  CREATININE 1.20  CALCIUM 8.9   GFR Estimated Creatinine Clearance: 44.6 mL/min (by C-G formula based on SCr of 1.2 mg/dL). Liver Function Tests: No results for input(s): AST, ALT, ALKPHOS, BILITOT, PROT, ALBUMIN in the last 168 hours. No results for input(s): LIPASE, AMYLASE in the last 168 hours. No results for input(s): AMMONIA in the last 168 hours. Coagulation profile No results for input(s): INR, PROTIME in the last 168 hours.  CBC: Recent Labs  Lab 07/22/24 1415 07/23/24 0855  WBC 8.0 7.1  HGB 8.4* 9.2*  HCT 24.3* 24.8*  MCV 103.0* 103.8*  PLT 188 197   Cardiac Enzymes: No results for input(s): CKTOTAL, CKMB, CKMBINDEX, TROPONINI in the last 168 hours. BNP (last 3 results) Recent Labs    07/22/24 1415  PROBNP 726.0*   CBG: No results for input(s): GLUCAP in the last 168 hours. D-Dimer: No results for input(s): DDIMER in the last 72 hours. Hgb A1c: No results for input(s): HGBA1C in the last 72 hours. Lipid Profile: No results for input(s): CHOL, HDL, LDLCALC, TRIG, CHOLHDL, LDLDIRECT in the  last 72 hours. Thyroid  function studies: No results for input(s): TSH, T4TOTAL, T3FREE, THYROIDAB in the last 72 hours.  Invalid input(s): FREET3 Anemia work up: Recent Labs    07/23/24 0644  FERRITIN 64  TIBC 370  IRON 65   Sepsis Labs: Recent Labs  Lab 07/22/24 1415 07/23/24 0855  WBC 8.0 7.1    Microbiology Recent Results (from the past 240 hours)  Resp panel by RT-PCR (RSV, Flu A&B, Covid) Urine, Clean Catch     Status: None   Collection Time: 07/22/24  6:10 PM   Specimen: Urine, Clean Catch; Nasal Swab  Result Value Ref Range Status   SARS Coronavirus 2 by RT PCR NEGATIVE NEGATIVE Final    Comment: (NOTE) SARS-CoV-2 target nucleic acids are NOT DETECTED.  The SARS-CoV-2 RNA is generally detectable in upper respiratory specimens during the acute phase of infection. The lowest concentration of SARS-CoV-2 viral copies this assay can detect is 138 copies/mL. A negative result does not preclude SARS-Cov-2 infection and should not be used as the sole basis for treatment or other patient management decisions. A negative result may occur with  improper specimen collection/handling, submission of specimen other than nasopharyngeal swab, presence of viral mutation(s) within the areas targeted by this assay, and inadequate number of viral copies(<138 copies/mL). A negative result must be combined with clinical observations, patient history, and epidemiological information. The expected result is Negative.  Fact Sheet for Patients:  bloggercourse.com  Fact Sheet for Healthcare Providers:  seriousbroker.it  This test is no t yet approved or cleared by the [place]  FDA and  has been authorized for detection and/or diagnosis of SARS-CoV-2 by FDA under an Emergency Use Authorization (EUA). This EUA will remain  in effect (meaning this test can be used) for the duration of the COVID-19 declaration under Section  564(b)(1) of the Act, 21 U.S.C.section 360bbb-3(b)(1), unless the authorization is terminated  or revoked sooner.       Influenza A by PCR NEGATIVE NEGATIVE Final   Influenza B by PCR NEGATIVE NEGATIVE Final  Comment: (NOTE) The Xpert Xpress SARS-CoV-2/FLU/RSV plus assay is intended as an aid in the diagnosis of influenza from Nasopharyngeal swab specimens and should not be used as a sole basis for treatment. Nasal washings and aspirates are unacceptable for Xpert Xpress SARS-CoV-2/FLU/RSV testing.  Fact Sheet for Patients: bloggercourse.com  Fact Sheet for Healthcare Providers: seriousbroker.it  This test is not yet approved or cleared by the [place]  FDA and has been authorized for detection and/or diagnosis of SARS-CoV-2 by FDA under an Emergency Use Authorization (EUA). This EUA will remain in effect (meaning this test can be used) for the duration of the COVID-19 declaration under Section 564(b)(1) of the Act, 21 U.S.C. section 360bbb-3(b)(1), unless the authorization is terminated or revoked.     Resp Syncytial Virus by PCR NEGATIVE NEGATIVE Final    Comment: (NOTE) Fact Sheet for Patients: bloggercourse.com  Fact Sheet for Healthcare Providers: seriousbroker.it  This test is not yet approved or cleared by the [place]  FDA and has been authorized for detection and/or diagnosis of SARS-CoV-2 by FDA under an Emergency Use Authorization (EUA). This EUA will remain in effect (meaning this test can be used) for the duration of the COVID-19 declaration under Section 564(b)(1) of the Act, 21 U.S.C. section 360bbb-3(b)(1), unless the authorization is terminated or revoked.  Performed at [facility], [address] [place], [place] [address]     Procedures and diagnostic studies:  ECHOCARDIOGRAM COMPLETE Result Date: 07/23/2024    ECHOCARDIOGRAM  REPORT   Patient Name:   William Taylor [facility] Date of Exam: 07/23/2024 Medical Rec #:  [id number]        Height:       76.0 in Accession #:    [id number]       Weight:       177.0 lb Date of Birth:  [birth year]        BSA:          2.103 m Patient Age:    89 years         BP:           156/79 mmHg Patient Gender: M                HR:           60 bpm. Exam Location:  [facility] Procedure: 2D Echo, Color Doppler and Cardiac Doppler (Both Spectral and Color            Flow Doppler were utilized during procedure). Indications:     Chest pain R07.9  History:         Patient has prior history of Echocardiogram examinations, most                  recent 05/22/2023. Arrythmias:Atrial Fibrillation; Risk                  Factors:Hypertension. Pulmonary embolism, Mildly dilated Aortic                  root.  Sonographer:     Christopher Furnace Referring Phys:  [id number] DELAYNE LULLA SOLIAN Diagnosing Phys: Caron Poser IMPRESSIONS  1. Left ventricular ejection fraction, by estimation, is 60 to 65%. The left ventricle has normal function. Left ventricular endocardial border not optimally defined to evaluate regional wall motion. There is severe asymmetric left ventricular hypertrophy of the basal-septal segment. Left ventricular diastolic parameters are consistent with Grade I diastolic dysfunction (impaired relaxation). No SAM or LVOT obstruction visualized.  2. Right ventricular systolic  function is normal. The right ventricular size is normal.  3. Left atrial size was moderately dilated.  4. The mitral valve is degenerative. No evidence of mitral valve regurgitation. No evidence of mitral stenosis. Moderate mitral annular calcification.  5. The aortic valve is tricuspid. There is mild calcification of the aortic valve. Aortic valve regurgitation is mild. Aortic valve sclerosis/calcification is present, without any evidence of aortic stenosis.  6. Aortic dilatation noted. There is mild dilatation of the ascending aorta, measuring 43 mm. Comparison(s):  A prior study was performed on 05/22/2023. No significant change from prior study. FINDINGS  Left Ventricle: Left ventricular ejection fraction, by estimation, is 60 to 65%. The left ventricle has normal function. Left ventricular endocardial border not optimally defined to evaluate regional wall motion. The left ventricular internal cavity size was normal in size. There is severe asymmetric left ventricular hypertrophy of the basal-septal segment. Left ventricular diastolic parameters are consistent with Grade I diastolic dysfunction (impaired relaxation). Right Ventricle: The right ventricular size is normal. Right vetricular wall thickness was not well visualized. Right ventricular systolic function is normal. Left Atrium: Left atrial size was moderately dilated. Right Atrium: Right atrial size was normal in size. Pericardium: There is no evidence of pericardial effusion. Mitral Valve: The mitral valve is degenerative in appearance. Moderate mitral annular calcification. No evidence of mitral valve regurgitation. No evidence of mitral valve stenosis. MV peak gradient, 4.5 mmHg. The mean mitral valve gradient is 2.0 mmHg. Tricuspid Valve: The tricuspid valve is not well visualized. Tricuspid valve regurgitation is trivial. No evidence of tricuspid stenosis. Aortic Valve: The aortic valve is tricuspid. There is mild calcification of the aortic valve. Aortic valve regurgitation is mild. Aortic valve sclerosis/calcification is present, without any evidence of aortic stenosis. Aortic valve mean gradient measures 3.0 mmHg. Aortic valve peak gradient measures 5.8 mmHg. Aortic valve area, by VTI measures 5.04 cm. Pulmonic Valve: The pulmonic valve was not well visualized. Pulmonic valve regurgitation is not visualized. No evidence of pulmonic stenosis. Aorta: Aortic dilatation noted. There is mild dilatation of the ascending aorta, measuring 43 mm. Venous: The inferior vena cava was not well visualized. IAS/Shunts: The  interatrial septum was not well visualized.  LEFT VENTRICLE PLAX 2D LVIDd:         4.00 cm   Diastology LVIDs:         2.90 cm   LV e' medial:    5.87 cm/s LV PW:         1.00 cm   LV E/e' medial:  13.0 LV IVS:        1.50 cm   LV e' lateral:   6.85 cm/s LVOT diam:     2.40 cm   LV E/e' lateral: 11.1 LV SV:         127 LV SV Index:   60 LVOT Area:     4.52 cm  RIGHT VENTRICLE RV Basal diam:  3.50 cm RV Mid diam:    3.40 cm RV S prime:     11.00 cm/s TAPSE (M-mode): 2.0 cm LEFT ATRIUM             Index        RIGHT ATRIUM           Index LA diam:        4.90 cm 2.33 cm/m   RA Area:     18.60 cm LA Vol (A2C):   91.1 ml 43.32 ml/m  RA Volume:   47.90 ml  22.78 ml/m LA Vol (A4C):   69.5 ml 33.05 ml/m LA Biplane Vol: 86.4 ml 41.08 ml/m  AORTIC VALVE AV Area (Vmax):    4.03 cm AV Area (Vmean):   4.54 cm AV Area (VTI):     5.04 cm AV Vmax:           120.00 cm/s AV Vmean:          80.100 cm/s AV VTI:            0.252 m AV Peak Grad:      5.8 mmHg AV Mean Grad:      3.0 mmHg LVOT Vmax:         107.00 cm/s LVOT Vmean:        80.300 cm/s LVOT VTI:          0.280 m LVOT/AV VTI ratio: 1.11  AORTA Ao Root diam: 3.90 cm MITRAL VALVE               TRICUSPID VALVE MV Area (PHT): 1.97 cm    TR Peak grad:   9.4 mmHg MV Area VTI:   4.78 cm    TR Vmax:        153.00 cm/s MV Peak grad:  4.5 mmHg MV Mean grad:  2.0 mmHg    SHUNTS MV Vmax:       1.06 m/s    Systemic VTI:  0.28 m MV Vmean:      71.3 cm/s   Systemic Diam: 2.40 cm MV Decel Time: 386 msec MV E velocity: 76.30 cm/s MV A velocity: 95.50 cm/s MV E/A ratio:  0.80 Caron Poser Electronically signed by Caron Poser Signature Date/Time: 07/23/2024/9:50:45 AM    Final    CT ABDOMEN PELVIS W CONTRAST Result Date: 07/22/2024 CLINICAL DATA:  Provided history: left inguinal pain, recent hernia repair Electronic records indicates hernia repair 02/27/2024 EXAM: CT ABDOMEN AND PELVIS WITH CONTRAST TECHNIQUE: Multidetector CT imaging of the abdomen and pelvis was performed  using the standard protocol following bolus administration of intravenous contrast. RADIATION DOSE REDUCTION: This exam was performed according to the departmental dose-optimization program which includes automated exposure control, adjustment of the mA and/or kV according to patient size and/or use of iterative reconstruction technique. CONTRAST:  OMNIPAQUE  IOHEXOL  300 MG/ML  SOLN COMPARISON:  CT 05/15/2024 FINDINGS: Lower chest: No basilar airspace disease or pleural effusion. Hepatobiliary: Mild diffuse hepatic steatosis. Tiny subcentimeter hypodensities are too small to characterize, but typically cysts. Layering gallstones within physiologically distended gallbladder. No pericholecystic inflammation. No biliary dilatation. Pancreas: Parenchymal atrophy. No ductal dilatation or inflammation. Spleen: Normal in size. Tiny hypodensity in the inferior spleen, of doubtful clinical significance. Scattered calcified granuloma. Adrenals/Urinary Tract: No adrenal nodule. No hydronephrosis, renal calculi, or suspicious renal lesion. No bladder wall thickening. Stomach/Bowel: Nondistended stomach. Equivocal gastric wall thickening versus nondistention. No small bowel obstruction or inflammation. Scattered small bowel fecalization. The appendix is normal. Colonic diverticulosis without diverticulitis. There also scattered small bowel diverticula. No colonic inflammation. Vascular/Lymphatic: Aortic and branch atherosclerosis. No aortic aneurysm. Patent portal vein. No suspicious lymphadenopathy. Reproductive: Enlarged prostate spans 5.4 cm and causes mild mass effect on the bladder base. Other: Strandy soft tissue density in the left inguinal canal has slightly contracted from prior exam and is consistent with postoperative change. No recurrent hernia. No focal fluid collection. No abdominopelvic ascites. Musculoskeletal: Degenerative change in the spine. No acute fracture of the lumbar spine or hips. Unchanged  alignment of right L3 transverse process fracture from prior with  interval callus. Callus formation about right L4 transverse process consistent with healing subacute fracture. IMPRESSION: 1. Postoperative change in the left inguinal canal. No recurrent hernia. No focal fluid collection. 2. Equivocal gastric wall thickening versus nondistention. Recommend correlation for symptoms of gastritis. 3. Colonic and small bowel diverticulosis without diverticulitis. 4. Cholelithiasis without gallbladder inflammation. 5. Hepatic steatosis. 6. Enlarged prostate. Aortic Atherosclerosis (ICD10-I70.0). Electronically Signed   By: Andrea Gasman M.D.   On: 07/22/2024 20:07   DG Chest 2 View Result Date: 07/22/2024 CLINICAL DATA:  Chest pain EXAM: DG CHEST 2V COMPARISON:  Chest x-ray 05/15/2024 FINDINGS: Heart is borderline enlarged. There is no focal lung infiltrate, pleural effusion or pneumothorax. No acute fractures are seen. IMPRESSION: Borderline cardiomegaly. No acute pulmonary process. Electronically Signed   By: Greig Pique M.D.   On: 07/22/2024 15:41               LOS: 0 days   Tarryn Bogdan  [facility]   Pager on [email or web address]. If 7PM-7AM, please contact night-coverage at [email or web address]     07/23/2024, 2:00 PM

## 2024-07-23 NOTE — Consult Note (Signed)
 Cardiology Consultation   Patient ID: [name] MRN: [id number]; DOB: 1930/06/08  Admit date: 07/22/2024 Date of Consult: 07/23/2024  PCP:  [name], MD   [facility] Providers Cardiologist:  [name], MD        Patient Profile: William Taylor is a 89 y.o. male with a hx of coronary artery disease with stent placement to the LAD in 2005 and to OM 2 in 2012, paroxysmal atrial fibrillation, history of PE, orthostatic hypotension, hypertension, hyperlipidemia, PVCs, CKD stage IIIa, and chronic fatigue who is being seen 07/23/2024 for the evaluation of chest pain at the request of Dr. Cleatus.  History of Present Illness:   Mr. Cross underwent stent placement in 2005 to the LAD.  He was noted to be in atrial fibrillation with RVR in the setting of UTI 04/2010.  Cardiac catheterization 12/2010 showed 90% OM 2 disease and he was transferred to [facility] where he underwent successful PCI/DES.  Cardiac catheterization 10/2013 showed small vessel disease and patent stents.  He unfortunately developed PE following his catheterization and was started on anticoagulation.  Stress test 06/2019 showed no significant ischemia and was considered low risk.  Cardiac MRI revealed severe asymmetrical septal hypertrophy 1.6 cm without outflow tract obstruction.  Echo 09/2020 with EF 60 to 65% with severe asymmetric basal septal hypertrophy.  He was hospitalized 08/2021 with COVID and was treated with remdesivir  and IV steroids.  Noted to have orthostatic hypotension during this day.  Most recent cardiac catheterization 04/2022 revealed patent LAD and OM 3 stents with moderate nonobstructive CAD and medical management was recommended.  He was evaluated in the ED 04/2023 with complaints of chest pain.  Unrelieved by nitroglycerin .  This was similar to previous episodes in the past.  Workup was overall unrevealing with negative troponin and EKG without acute ischemic changes.  He followed up  later that month and reported several visits to the emergency department for chest pain with unrevealing workup.  There was discussion for addition of long-acting nitrate but this was deferred given history of orthostatic hypotension.  Most recent echo 05/2023 showed EF 60 to 65% with G1 DD and no significant valvular abnormalities.  Mild dilation of the aortic root at 40 mm and mild dilation of the ascending aorta at 41 mm.    Patient reports that he was feeling well until Wednesday when he notes just not feeling great that day with increased fatigue.  He woke up yesterday morning and continued to feel fatigued.  In the afternoon, he was going to walk to the mailbox to get the mail but experienced sudden onset midsternal chest tightness.  He rates this 7-8 out of 10 in severity.  He denies any associated symptoms of lightheadedness, dizziness, palpitations, nausea, or diaphoresis.  He went inside and took 2 sublingual nitroglycerin  without relief.  When he continued to have chest pain, he decided to call EMS.  He was given 324 mg of aspirin  with EMS and reports almost total relief of symptoms by the time he reached the emergency department.  In the ED, heart rate 115 bpm with otherwise normal vital signs.  Pertinent labs include BUN 32, hemoglobin 8.4, and hematocrit 24.3.  Troponin 30 > 32.  Initial EKG shows atrial fibrillation with rate 117 bpm.  He was given IV metoprolol  with improvement in heart rate.  Cardiology was asked to consult for further evaluation of chest pain.  At time of cardiology consult, patient denies any recurrence of chest  pain since admission.  He continues to feel fatigued.  He reports blood in his toilet this morning, although he is not sure if it was from his urine or stool.  He denies any bleeding prior to this.  He reports chronic dyspnea on exertion which is unchanged from baseline.  He denies lightheadedness, dizziness, palpitations, lower extremity swelling, and  orthopnea.  Past Medical History:  Diagnosis Date   Acquired dilation of ascending aorta and aortic root    Aortic atherosclerosis    Atypical chest pain    BPH (benign prostatic hyperplasia)    Cerebral microvascular disease    CKD (chronic kidney disease), stage III (HCC)    Colon polyp    Compression fracture of T2 vertebra (HCC) 02/26/2023   Coronary artery disease    a.) PCI LAD (Cypher DES) in 2005; b.)  LHC 2009: no obstructive CAD and patent stent; c.) PCI OM2 12/26/2010 (2.25 x 16 mm Promus Element); d.) LHC 10/28/2013: LAD 25m ISR, 78m, D1 90, D2 70, OM2 80 w/ patent stent;  e.) 06/2019 Low risk MV; f.) 07/2020 MV: EF 77%, no ischemia/infarct.   CVA (cerebral vascular accident) [facility])    a.) age indeterminate infarcts in the RIGHT cerebellum and LEFT parietal lobe   Depression    Dyspnea    Gall stones    GERD (gastroesophageal reflux disease)    Glaucoma    Hyperlipidemia    Hypertension    Hypertrophic nonobstructive cardiomyopathy (with severe asymmetric septal hypertrophy)    IDA (iron deficiency anemia)    Left inguinal hernia    Leg weakness    Long term current use of amiodarone     Melanoma (HCC)    Multinodular thyroid     NSTEMI (non-ST elevated myocardial infarction) (HCC) 10/26/2013   a.) experienced in setting of antithrombotic hold (clopidogrel ) for routine colonoscopy; b.) troponins trended: 1.00 --> 5.10 --> 6.20 ng/mL; c.) LHC 10/31/2013: 10/40% mLAD, 90/70% D1, 80% OM1 -- med mgmt   NSTEMI (non-ST elevated myocardial infarction) (HCC) 12/24/2010   a.) LHC 12/24/2010: 30 % LAD, 30% ISR mLAD, 50% dLAD, 95% OM2 --> d/t distal bifurcation lesion unable to see RCA --> transfer to [facility] --> PCI 12/26/2010 placing a 2.25 x 16 mm Promus Element DES to OM2   On rivaroxaban  therapy    Orthostatic hypotension    a. prev on midodrine /florinef /northera .   PAF (paroxysmal atrial fibrillation) (HCC)    a.) CHA2DS2VASc = 6 (age x2, HTN, CVA/PE x 2, prior MI/vascular  disease) as of 02/24/2024; b.) rate/rhythm maintained on oral amiodarone ; chronically anticoagulated with rivaroxaban    Prostate cancer (HCC)    PSVT (paroxysmal supraventricular tachycardia)    Pulmonary embolism (HCC) 2006   Skin cancer    Wears dentures    partial upper and lower   Wears hearing aid in both ears     Past Surgical History:  Procedure Laterality Date   CATARACT EXTRACTION Right 05/22/2011   CATARACT EXTRACTION W/PHACO Left 10/01/2023   Procedure: CATARACT EXTRACTION PHACO AND INTRAOCULAR LENS PLACEMENT (IOC) LEFT 23.44 01:56.2;  Surgeon: Mittie Gaskin, MD;  Location[facility];  Service: Ophthalmology;  Laterality: Left;   colonoscopy     COLONOSCOPY W/ POLYPECTOMY  08/19/2013   Dr Jeri   COLONOSCOPY WITH PROPOFOL  N/A 08/15/2016   Procedure: COLONOSCOPY WITH PROPOFOL ;  Surgeon: Ruel Kung, MD;  Location: [facility] END[facility]COPY;  Service: Endoscopy;  Laterality: N/A;   CORONARY ANGIOPLASTY WITH STENT PLACEMENT Left 2005   Procedure: CORONARY ANGIOPLASTY WITH STENT  PLACEMENT; Location: [facility])   CORONARY ANGIOPLASTY WITH STENT PLACEMENT Left 12/26/2010   Procedure: CORONARY ANGIOPLASTY WITH STENT PLACEMENT; Location: [facility]; Surgeon: Peter Jordan, MD   ENTEROSCOPY N/A 09/22/2018   Procedure: ENTEROSCOPY;  Surgeon: Unk Corinn Skiff, MD;  Location: [facility] ENDOSCOPY;  Service: Gastroenterology;  Laterality: N/A;   ESOPHAGOGASTRODUODENOSCOPY (EGD) WITH PROPOFOL  N/A 08/15/2016   Procedure: ESOPHAGOGASTRODUODENOSCOPY (EGD) WITH PROPOFOL ;  Surgeon: Ruel Kung, MD;  Location: [facility] ENDOSCOPY;  Service: Endoscopy;  Laterality: N/A;   GIVENS CAPSULE STUDY N/A 09/26/2016   Procedure: GIVENS CAPSULE STUDY;  Surgeon: Ruel Kung, MD;  Location: [facility] ENDOSCOPY;  Service: Endoscopy;  Laterality: N/A;   HERNIA REPAIR  08/20/2011   INGUINAL HERNIA REPAIR Left 02/27/2024   Procedure: REPAIR, HERNIA, INGUINAL, ADULT, RECURRENT;  Surgeon: Marinda Jayson KIDD,  MD;  Location: [facility] ORS;  Service: General;  Laterality: Left;  open procedure with mesh   LEFT HEART CATH AND CORONARY ANGIOGRAPHY N/A 04/29/2022   Procedure: LEFT HEART CATH AND CORONARY ANGIOGRAPHY;  Surgeon: Darron Deatrice LABOR, MD;  Location: [facility] INVASIVE CV LAB;  Service: Cardiovascular;  Laterality: N/A;   LEFT HEART CATH AND CORONARY ANGIOGRAPHY Left 2009   Procedure: LEFT HEART CATH AND CORONARY ANGIOGRAPHY; Location: [facility])   LEFT HEART CATH AND CORONARY ANGIOGRAPHY Left 12/24/2010   Procedure: LEFT HEART CATH AND CORONARY ANGIOGRAPHY; Locaiton: [facility]; Surgeon: [name], MD   LEFT HEART CATH AND CORONARY ANGIOGRAPHY Left 10/28/2013   Procedure: LEFT HEART CATH AND CORONARY ANGIOGRAPHY; Location: [facility]; Surgeon: [name], MD   LUMBAR SPINE SURGERY     x2 in [facility] in [phone]   UPPER GI ENDOSCOPY  04/19/2014   Dr Jeri       Scheduled Meds:  amiodarone   100 mg Oral Daily   [START ON 07/24/2024] aspirin  EC  81 mg Oral Daily   finasteride   5 mg Oral Daily   pantoprazole  (PROTONIX ) IV  40 mg Intravenous Q24H   pravastatin   40 mg Oral QHS   Continuous Infusions:  sodium chloride  75 mL/hr at 07/23/24 0226   PRN Meds: acetaminophen , nitroGLYCERIN , ondansetron  (ZOFRAN ) IV  Allergies:    Allergies  Allergen Reactions   Bee Venom Swelling   Sulfasalazine Hives and Swelling   Warfarin     Chest pain    Social History:   Social History   Socioeconomic History   Marital status: Married    Spouse name: Not on file   Number of children: Not on file   Years of education: Not on file   Highest education level: Some college, no degree  Occupational History   Occupation: retired   Tobacco Use   Smoking status: Never   Smokeless tobacco: Never  Vaping Use   Vaping status: Never Used  Substance and Sexual Activity   Alcohol use: No   Drug use: No   Sexual activity: Not Currently  Other Topics Concern   Not on file  Social History Narrative    Retired city emergency planning/management officer, 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     Married 2010   1 daughter in [place], [place], 2 daughters in [place].   Social Drivers of Corporate Investment Banker Strain: Low Risk  (05/07/2024)   Overall Financial Resource Strain (CARDIA)    Difficulty of Paying Living Expenses: Not hard at all  Food Insecurity: No Food Insecurity (06/30/2024)   Hunger Vital Sign    Worried About Running Out of Food in the Last Year: Never true  Ran Out of Food in the Last Year: Never true  Transportation Needs: No Transportation Needs (06/30/2024)   PRAPARE - Administrator, Civil Service (Medical): No    Lack of Transportation (Non-Medical): No  Physical Activity: Inactive (06/30/2024)   Exercise Vital Sign    Days of Exercise per Week: 0 days    Minutes of Exercise per Session: Not on file  Stress: No Stress Concern Present (06/30/2024)   [employer] of Occupational Health - Occupational Stress Questionnaire    Feeling of Stress: Not at all  Social Connections: Socially Integrated (06/30/2024)   Social Connection and Isolation Panel    Frequency of Communication with Friends and Family: Twice a week    Frequency of Social Gatherings with Friends and Family: Three times a week    Attends Religious Services: More than 4 times per year    Active Member of Clubs or Organizations: Yes    Attends Banker Meetings: More than 4 times per year    Marital Status: Married  Catering Manager Violence: Not At Risk (03/04/2024)   Humiliation, Afraid, Rape, and Kick questionnaire    Fear of Current or Ex-Partner: No    Emotionally Abused: No    Physically Abused: No    Sexually Abused: No    Family History:    Family History  Problem Relation Age of Onset   Stroke Mother    Colon cancer Father    Coronary artery disease Brother    Other Brother 33       lymes dz     ROS:  Please see the history of present illness.   All other ROS reviewed and negative.      Physical Exam/Data: Vitals:   07/23/24 0514 07/23/24 0530 07/23/24 0600 07/23/24 0630  BP:  138/81 139/71 (!) 156/79  Pulse:  (!) 57 61 60  Resp:  19 18 20   Temp: 97.9 F (36.6 C)     TempSrc: Oral     SpO2:  99% 100% 100%  Weight:      Height:       No intake or output data in the 24 hours ending 07/23/24 0804    07/22/2024    2:12 PM 07/01/2024   10:50 AM 05/24/2024    1:12 PM  Last 3 Weights  Weight (lbs) 177 lb 177 lb 6 oz 176 lb 9.6 oz  Weight (kg) 80.287 kg 80.457 kg 80.105 kg     Body mass index is 21.55 kg/m.  General:  Well nourished, well developed, in no acute distress HEENT: normal Neck: no JVD Vascular: No carotid bruits; Distal pulses 2+ bilaterally Cardiac:  normal S1, S2; RRR; no murmur  Lungs:  clear to auscultation bilaterally, no wheezing, rhonchi or rales  Abd: soft, nontender, no hepatomegaly  Ext: no edema Skin: warm and dry  Psych:  Normal affect   EKG:  The EKG was personally reviewed and demonstrates:  Atrial fibrillation, rate 117 bpm Telemetry:  Telemetry was personally reviewed and demonstrates: Atrial fibrillation with conversion to normal sinus rhythm on 12/5 at 0132  Relevant CV Studies:  05/2023 Echo complete 1. Left ventricular ejection fraction, by estimation, is 60 to 65%. The  left ventricle has normal function. The left ventricle has no regional  wall motion abnormalities. Left ventricular diastolic parameters are  consistent with Grade I diastolic  dysfunction (impaired relaxation).   2. Right ventricular systolic function is normal. The right ventricular  size is normal.  3. The mitral valve is normal in structure. No evidence of mitral valve  regurgitation. No evidence of mitral stenosis.   4. The aortic valve is tricuspid. Aortic valve regurgitation is not  visualized. No aortic stenosis is present. Aortic valve mean gradient  measures 5.0 mmHg.   5. There is mild dilatation of the aortic root, measuring 40 mm. There is   mild dilatation of the ascending aorta, measuring 41 mm.   6. The inferior vena cava is normal in size with greater than 50%  respiratory variability, suggesting right atrial pressure of 3 mmHg.   04/2022 LHC 1.  Widely patent LAD and OM3 stents with minimal restenosis.  Moderately calcified coronary arteries with mild to moderate nonobstructive coronary artery disease. 2.  Left ventricular angiography was not performed due to chronic kidney disease.  LVEDP was mildly elevated.  10/2020 Cardiac MRI 1.  Normal left and right ventricular size and function.  LVEF 56% 2. There is severe asymmetric septal hypertrophy of the basal septum measuring 1.6 cm 3. There is no LVOT obstruction. 4. There is no late gadolinium enhancement or scar in the LV myocardium  Laboratory Data: High Sensitivity Troponin:  No results for input(s): TROPONINIHS in the last 720 hours.   Chemistry Recent Labs  Lab 07/22/24 1415  NA 140  K 3.8  CL 107  CO2 23  GLUCOSE 122*  BUN 32*  CREATININE 1.20  CALCIUM 8.9  GFRNONAA 57*  ANIONGAP 10    No results for input(s): PROT, ALBUMIN, AST, ALT, ALKPHOS, BILITOT in the last 168 hours. Lipids No results for input(s): CHOL, TRIG, HDL, LABVLDL, LDLCALC, CHOLHDL in the last 168 hours.  Hematology Recent Labs  Lab 07/22/24 1415  WBC 8.0  RBC 2.36*  HGB 8.4*  HCT 24.3*  MCV 103.0*  MCH 35.6*  MCHC 34.6  RDW 14.8  PLT 188   Thyroid  No results for input(s): TSH, FREET4 in the last 168 hours.  BNP Recent Labs  Lab 07/22/24 1415  PROBNP 726.0*    DDimer No results for input(s): DDIMER in the last 168 hours.  Radiology/Studies:  CT ABDOMEN PELVIS W CONTRAST Result Date: 07/22/2024 IMPRESSION: 1. Postoperative change in the left inguinal canal. No recurrent hernia. No focal fluid collection. 2. Equivocal gastric wall thickening versus nondistention. Recommend correlation for symptoms of gastritis. 3. Colonic and small bowel  diverticulosis without diverticulitis. 4. Cholelithiasis without gallbladder inflammation. 5. Hepatic steatosis. 6. Enlarged prostate. Aortic Atherosclerosis (ICD10-I70.0). Electronically Signed   By: Andrea Gasman M.D.   On: 07/22/2024 20:07   DG Chest 2 View Result Date: 07/22/2024 IMPRESSION: Borderline cardiomegaly. No acute pulmonary process. Electronically Signed   By: Greig Pique M.D.   On: 07/22/2024 15:41   Assessment and Plan:  Precordial pain Coronary artery disease - Chronic chest pain dating back several years with most recent cardiac catheterization 04/2022 which revealed nonobstructive disease - Presenting with sudden onset 8 out of 10 chest pain, unrelieved with nitroglycerin  - Troponin minimally elevated and flat trending, not consistent with ACS - EKG is without acute ischemic changes - Was noted to be in rapid afib on arrival which could have contributed to chest pain -Echo this admission shows preserved LV systolic function, unable to assess RWMA - Discussed options including medical management vs inpatient ischemic evaluation.  Patient prefers to proceed with inpatient workup.  Recommend Lexiscan  Myoview  to rule out high risk ischemia. - He is on DOAC in place of aspirin  which is currently held, see below.  Continue statin therapy. - Has historically not been on BB, CCB, or long-acting nitrate therapy due to orthostatic hypotension.  Could consider Ranexa  for antianginal therapy.   Paroxysmal atrial fibrillation with RVR - Patient was in rapid atrial fibrillation on arrival, perhaps contributing to chest discomfort - Converted to sinus rhythm around 0130 this morning and has been maintaining sinus since - Xarelto  was held due to anemia which appears to be improving.  Would recommend trending hemoglobin and considering resumption of DOAC. - Continue amiodarone , consider increasing to 200 mg daily to assist with maintaining sinus rhythm  Normocytic anemia - Hemoglobin  initially 8.4, baseline appears to be around 10 - Hemoglobin now improved to 9.2 - Patient reports blood in his toilet this morning - Evaluated by GI this morning who did not feel labs were consistent with acute GI bleed given normal iron levels - Ongoing management per IM - Consider resuming Xarelto  as above    Informed Consent   Shared Decision Making/Informed Consent The risks [chest pain, shortness of breath, cardiac arrhythmias, dizziness, blood pressure fluctuations, myocardial infarction, stroke/transient ischemic attack, nausea, vomiting, allergic reaction, radiation exposure, metallic taste sensation and life-threatening complications (estimated to be 1 in 10,000)], benefits (risk stratification, diagnosing coronary artery disease, treatment guidance) and alternatives of a nuclear stress test were discussed in detail with Mr. Durfey and he agrees to proceed.     For questions or updates, please contact [facility] Please consult [email or web address] for contact info under      Signed, Lesley LITTIE Maffucci, PA-C  07/23/2024 8:04 AM

## 2024-07-24 ENCOUNTER — Other Ambulatory Visit: Payer: Self-pay

## 2024-07-24 DIAGNOSIS — I200 Unstable angina: Secondary | ICD-10-CM | POA: Diagnosis not present

## 2024-07-24 LAB — CBC
HCT: 29.5 % — ABNORMAL LOW (ref 39.0–52.0)
Hemoglobin: 10.3 g/dL — ABNORMAL LOW (ref 13.0–17.0)
MCH: 34.3 pg — ABNORMAL HIGH (ref 26.0–34.0)
MCHC: 34.9 g/dL (ref 30.0–36.0)
MCV: 98.3 fL (ref 80.0–100.0)
Platelets: 205 K/uL (ref 150–400)
RBC: 3 MIL/uL — ABNORMAL LOW (ref 4.22–5.81)
RDW: 14.5 % (ref 11.5–15.5)
WBC: 8.3 K/uL (ref 4.0–10.5)
nRBC: 0 % (ref 0.0–0.2)

## 2024-07-24 LAB — HAPTOGLOBIN: Haptoglobin: <10 mg/dL — ABNORMAL LOW (ref 38–329)

## 2024-07-24 LAB — BASIC METABOLIC PANEL WITH GFR
Anion gap: 9 (ref 5–15)
BUN: 24 mg/dL — ABNORMAL HIGH (ref 8–23)
CO2: 25 mmol/L (ref 22–32)
Calcium: 9.8 mg/dL (ref 8.9–10.3)
Chloride: 106 mmol/L (ref 98–111)
Creatinine, Ser: 1.14 mg/dL (ref 0.61–1.24)
GFR, Estimated: >60 mL/min (ref >60)
Glucose, Bld: 105 mg/dL — ABNORMAL HIGH (ref 70–99)
Potassium: 4.3 mmol/L (ref 3.5–5.1)
Sodium: 140 mmol/L (ref 135–145)

## 2024-07-24 LAB — MAGNESIUM: Magnesium: 1.6 mg/dL — ABNORMAL LOW (ref 1.7–2.4)

## 2024-07-24 MED ORDER — PANTOPRAZOLE SODIUM 40 MG PO TBEC: 40.0000 mg | DELAYED_RELEASE_TABLET | Freq: Every day | ORAL | Status: DC

## 2024-07-24 MED ORDER — MAGNESIUM SULFATE 2 GM/50ML IV SOLN
2.0000 g | Freq: Once | INTRAVENOUS | Status: AC
  Administered 2024-07-24: 2 g via INTRAVENOUS
  Filled 2024-07-24: qty 50

## 2024-07-24 MED ORDER — LEVOBUNOLOL HCL 0.5 % OP SOLN
1.0000 [drp] | Freq: Every day | OPHTHALMIC | Status: DC
  Administered 2024-07-24: 1 [drp] via OPHTHALMIC
  Filled 2024-07-24: qty 5

## 2024-07-24 NOTE — Discharge Summary (Signed)
 Physician Discharge Summary   Patient: William Taylor MRN: [id number] DOB: Apr 12, 1930  Admit date:     07/22/2024  Discharge date: 07/24/24  Discharge Physician: AIDA CHO   PCP: [name], MD   Recommendations at discharge:   Follow-up with PCP in 1 week  Discharge Diagnoses: Principal Problem:   Precordial chest pain, exertional (HCC) Active Problems:   Symptomatic anemia, suspect chronic GI blood loss   History of pulmonary embolism ([id number])   H/O left inguinal hernia repair 02/27/2024   Paroxysmal atrial fibrillation (HCC)   CKD (chronic kidney disease), stage IIIa   Coronary artery disease of native artery of native heart with stable angina pectoris   Non-ST elevation (NSTEMI) myocardial infarction [facility])   Thoracic ascending aortic aneurysm   Macrocytic anemia   Rapid atrial fibrillation (HCC)  Resolved Problems:   * No resolved hospital problems. *  Hospital Course:  William Taylor is a 89 y.o. male  with medical history significant for CAD s/p LAD stent(nonobstructive CAD on [facility] 2023), pAF on amiodarone  and Xarelto , prior pulmonary embolism (2006, 2012), HTN, orthostatic hypotension, chronic atypical chest pain, ascending aortic aneurysm, CKD stage IIIa, headaches/occipital neuralgia, history of left inguinal hernia repair with mesh July 2025 complicated by surgical site infection shortly thereafter.  He presented to the hospital with chest pain.  He said he was walking to the mailbox when he noticed he had difficulty walking because of the chest pain.  He took 2 tablets of nitroglycerin  without any relief.  He said he also felt short of breath and tired so he said I am always short of breath.  EMS was called and he was given 4 tablets of baby aspirin  and route to the ED. Of note, patient's last cath was in 2023 and showed nonobstructive CAD. On his last office visit to cardiology in October 2024 he is noted to have a history of chronic chest pain dating  back several years    He also complained of pain in the left inguinal area.  He had left inguinal hernia surgery in July 2025 which he said was complicated by infection and had to be kept overnight after surgery.  He said he had been doing well until recently when he noticed pain in the left groin.  He said in the ED, the left groin area was very tender to palpation.     Vitals in the ED: Temperature 98.1, respiratory rate 18, pulse 115, BP 121/73, O2 sat 98% on room air.     EKG showed atrial fibrillation with RVR   Troponins 30, 32 proBNP 726      Assessment and Plan:   Chest pain, mildly elevated troponins, CAD s/p prior PCI: Troponins are flat.  No evidence of ACS.  Nuclear stress test was negative for ischemia, LV perfusion normal and the study was reported as a low risk with normal LV function and nuclear stress EF 66%. 2D echo showed EF estimated at 60 to 65%, grade 1 diastolic dysfunction, moderately dilated left atrium, aortic dilation and mild dilation of the ascending aorta. Continue pravastatin .     Paroxysmal atrial fibrillation with RVR: Heart rate is better.  Continue amiodarone .  Resume Xarelto  at discharge.  He understands that he is still at increased risk for bleeding given advanced age. He has been advised to stop Xarelto  and notify his PCP or cardiologist if he develops any bleeding complications.     ?  One episode of rectal bleeding: He said he  used the commode and saw small amount of blood in the commode but he was not sure whether the blood was coming from the urine or from the rectum.  No further bleeding episodes. He was evaluated by Dr. Jinny, gastroenterologist.  No plan for inpatient endoscopic workup.  Outpatient follow-up with Dr. Therisa, gastroenterologist, recommended.     Acute on chronic anemia: H&H stable.  Hemoglobin up from 8.4-9.2-10.3 without any blood transfusion.      Left inguinal pain, history of left inguinal hernia surgery in July 2025:  Pain is better.  CT abdomen and pelvis did not show any recurrence of hernia or focal fluid collection in inguinal canal.  However, patient is convinced that there is something wrong and requested evaluation by the surgeon.   He was evaluated by Dr. Marinda, surgeon, as requested.  No acute issues from left inguinal region discomfort. He can follow-up with Dr. Marinda, general surgeon, as an outpatient if left inguinal pain persist.   Hypomagnesemia: Magnesium  was 1.2.  He was given IV magnesium  sulfate and magnesium  came up to 1.6.  He was given another dose of IV magnesium  sulfate 2 g prior to discharge.  Outpatient follow-up with PCP recommended to monitor electrolytes.     Comorbidities include history of pulmonary embolism (2016 2012), CKD stage IIIa, BPH, history of stroke, hypertension, hyperlipidemia, advanced age, history of prostate cancer     His condition has improved and he is deemed stable for discharge to home today.  Discharge plan was discussed with the patient and his wife at the bedside.        Consultants: Cardiologist, gastroenterologist Procedures performed: Pharmacologic nuclear stress test Disposition: Home Diet recommendation:  Discharge Diet Orders (From admission, onward)     Start     Ordered   07/24/24 0000  Diet - low sodium heart healthy        07/24/24 1414           Cardiac diet DISCHARGE MEDICATION: Allergies as of 07/24/2024       Reactions   Bee Venom Swelling   Sulfasalazine Hives, Swelling   Warfarin    Chest pain        Medication List     TAKE these medications    acetaminophen  500 MG tablet Commonly known as: TYLENOL  Take 500 mg by mouth every 6 (six) hours as needed for moderate pain (pain score 4-6).   amiodarone  100 MG tablet Commonly known as: PACERONE  Take 1 tablet (100 mg total) by mouth daily.   fenofibrate  micronized 130 MG capsule Commonly known as: ANTARA  TAKE 1 CAPSULE BY MOUTH DAILY  BEFORE BREAKFAST    finasteride  5 MG tablet Commonly known as: PROSCAR  TAKE 1 TABLET BY MOUTH DAILY   levobunolol  0.5 % ophthalmic solution Commonly known as: BETAGAN  Place 1 drop into the right eye daily.   loratadine  10 MG tablet Commonly known as: CLARITIN  Take 1 tablet (10 mg total) by mouth daily. What changed:  when to take this reasons to take this   MULTIVITAMIN PO Take 1 tablet by mouth daily.   nitroGLYCERIN  0.4 MG SL tablet Commonly known as: NITROSTAT  DISSOLVE UNDER THE TONGUE 1 TABLET EVERY 5 MINUTES AS NEEDED FOR CHEST PAIN   pantoprazole  40 MG tablet Commonly known as: PROTONIX  TAKE ONE (1) TABLET BY MOUTH TWO TIMES PER DAY   pravastatin  40 MG tablet Commonly known as: PRAVACHOL  Take 1 tablet (40 mg total) by mouth at bedtime.   Xarelto  15 MG Tabs tablet  Generic drug: Rivaroxaban  TAKE 1 TABLET BY MOUTH DAILY        Discharge Exam: Filed Weights   07/22/24 1412  Weight: 80.3 kg   GEN: NAD SKIN: Warm and dry EYES: No pallor or icterus ENT: MMM CV: RRR PULM: CTA B ABD: soft, ND, NT, +BS, left groin tenderness is better CNS: AAO x 3, non focal EXT: No edema or tenderness   Condition at discharge: good  The results of significant diagnostics from this hospitalization (including imaging, microbiology, ancillary and laboratory) are listed below for reference.   Imaging Studies: NM Myocar Multi W/Spect W/Wall Motion / EF Result Date: 07/23/2024   Vasodilator stress ECG was negative for ischemia.   LV perfusion is normal. Diaphragmatic attenuation artifact present.   Left ventricular function is normal. Nuclear stress EF: 66%. No TID.   The study is low risk.   ECHOCARDIOGRAM COMPLETE Result Date: 07/23/2024    ECHOCARDIOGRAM REPORT   Patient Name:   JIVAN SYMANSKI [facility] Date of Exam: 07/23/2024 Medical Rec #:  [id number]        Height:       76.0 in Accession #:    [id number]       Weight:       177.0 lb Date of Birth:  September 30, 1929        BSA:          2.103 m Patient  Age:    89 years         BP:           156/79 mmHg Patient Gender: M                HR:           60 bpm. Exam Location:  [facility] Procedure: 2D Echo, Color Doppler and Cardiac Doppler (Both Spectral and Color            Flow Doppler were utilized during procedure). Indications:     Chest pain R07.9  History:         Patient has prior history of Echocardiogram examinations, most                  recent 05/22/2023. Arrythmias:Atrial Fibrillation; Risk                  Factors:Hypertension. Pulmonary embolism, Mildly dilated Aortic                  root.  Sonographer:     Christopher Furnace Referring Phys:  [id number] DELAYNE LULLA SOLIAN Diagnosing Phys: Caron Poser IMPRESSIONS  1. Left ventricular ejection fraction, by estimation, is 60 to 65%. The left ventricle has normal function. Left ventricular endocardial border not optimally defined to evaluate regional wall motion. There is severe asymmetric left ventricular hypertrophy of the basal-septal segment. Left ventricular diastolic parameters are consistent with Grade I diastolic dysfunction (impaired relaxation). No SAM or LVOT obstruction visualized.  2. Right ventricular systolic function is normal. The right ventricular size is normal.  3. Left atrial size was moderately dilated.  4. The mitral valve is degenerative. No evidence of mitral valve regurgitation. No evidence of mitral stenosis. Moderate mitral annular calcification.  5. The aortic valve is tricuspid. There is mild calcification of the aortic valve. Aortic valve regurgitation is mild. Aortic valve sclerosis/calcification is present, without any evidence of aortic stenosis.  6. Aortic dilatation noted. There is mild dilatation of the ascending aorta, measuring 43 mm. Comparison(s): A prior study was performed on  05/22/2023. No significant change from prior study. FINDINGS  Left Ventricle: Left ventricular ejection fraction, by estimation, is 60 to 65%. The left ventricle has normal function. Left ventricular  endocardial border not optimally defined to evaluate regional wall motion. The left ventricular internal cavity size was normal in size. There is severe asymmetric left ventricular hypertrophy of the basal-septal segment. Left ventricular diastolic parameters are consistent with Grade I diastolic dysfunction (impaired relaxation). Right Ventricle: The right ventricular size is normal. Right vetricular wall thickness was not well visualized. Right ventricular systolic function is normal. Left Atrium: Left atrial size was moderately dilated. Right Atrium: Right atrial size was normal in size. Pericardium: There is no evidence of pericardial effusion. Mitral Valve: The mitral valve is degenerative in appearance. Moderate mitral annular calcification. No evidence of mitral valve regurgitation. No evidence of mitral valve stenosis. MV peak gradient, 4.5 mmHg. The mean mitral valve gradient is 2.0 mmHg. Tricuspid Valve: The tricuspid valve is not well visualized. Tricuspid valve regurgitation is trivial. No evidence of tricuspid stenosis. Aortic Valve: The aortic valve is tricuspid. There is mild calcification of the aortic valve. Aortic valve regurgitation is mild. Aortic valve sclerosis/calcification is present, without any evidence of aortic stenosis. Aortic valve mean gradient measures 3.0 mmHg. Aortic valve peak gradient measures 5.8 mmHg. Aortic valve area, by VTI measures 5.04 cm. Pulmonic Valve: The pulmonic valve was not well visualized. Pulmonic valve regurgitation is not visualized. No evidence of pulmonic stenosis. Aorta: Aortic dilatation noted. There is mild dilatation of the ascending aorta, measuring 43 mm. Venous: The inferior vena cava was not well visualized. IAS/Shunts: The interatrial septum was not well visualized.  LEFT VENTRICLE PLAX 2D LVIDd:         4.00 cm   Diastology LVIDs:         2.90 cm   LV e' medial:    5.87 cm/s LV PW:         1.00 cm   LV E/e' medial:  13.0 LV IVS:        1.50 cm   LV  e' lateral:   6.85 cm/s LVOT diam:     2.40 cm   LV E/e' lateral: 11.1 LV SV:         127 LV SV Index:   60 LVOT Area:     4.52 cm  RIGHT VENTRICLE RV Basal diam:  3.50 cm RV Mid diam:    3.40 cm RV S prime:     11.00 cm/s TAPSE (M-mode): 2.0 cm LEFT ATRIUM             Index        RIGHT ATRIUM           Index LA diam:        4.90 cm 2.33 cm/m   RA Area:     18.60 cm LA Vol (A2C):   91.1 ml 43.32 ml/m  RA Volume:   47.90 ml  22.78 ml/m LA Vol (A4C):   69.5 ml 33.05 ml/m LA Biplane Vol: 86.4 ml 41.08 ml/m  AORTIC VALVE AV Area (Vmax):    4.03 cm AV Area (Vmean):   4.54 cm AV Area (VTI):     5.04 cm AV Vmax:           120.00 cm/s AV Vmean:          80.100 cm/s AV VTI:            0.252 m AV Peak Grad:  5.8 mmHg AV Mean Grad:      3.0 mmHg LVOT Vmax:         107.00 cm/s LVOT Vmean:        80.300 cm/s LVOT VTI:          0.280 m LVOT/AV VTI ratio: 1.11  AORTA Ao Root diam: 3.90 cm MITRAL VALVE               TRICUSPID VALVE MV Area (PHT): 1.97 cm    TR Peak grad:   9.4 mmHg MV Area VTI:   4.78 cm    TR Vmax:        153.00 cm/s MV Peak grad:  4.5 mmHg MV Mean grad:  2.0 mmHg    SHUNTS MV Vmax:       1.06 m/s    Systemic VTI:  0.28 m MV Vmean:      71.3 cm/s   Systemic Diam: 2.40 cm MV Decel Time: 386 msec MV E velocity: 76.30 cm/s MV A velocity: 95.50 cm/s MV E/A ratio:  0.80 Caron Poser Electronically signed by Caron Poser Signature Date/Time: 07/23/2024/9:50:45 AM    Final    CT ABDOMEN PELVIS W CONTRAST Result Date: 07/22/2024 CLINICAL DATA:  Provided history: left inguinal pain, recent hernia repair Electronic records indicates hernia repair 02/27/2024 EXAM: CT ABDOMEN AND PELVIS WITH CONTRAST TECHNIQUE: Multidetector CT imaging of the abdomen and pelvis was performed using the standard protocol following bolus administration of intravenous contrast. RADIATION DOSE REDUCTION: This exam was performed according to the departmental dose-optimization program which includes automated exposure control,  adjustment of the mA and/or kV according to patient size and/or use of iterative reconstruction technique. CONTRAST:  OMNIPAQUE  IOHEXOL  300 MG/ML  SOLN COMPARISON:  CT 05/15/2024 FINDINGS: Lower chest: No basilar airspace disease or pleural effusion. Hepatobiliary: Mild diffuse hepatic steatosis. Tiny subcentimeter hypodensities are too small to characterize, but typically cysts. Layering gallstones within physiologically distended gallbladder. No pericholecystic inflammation. No biliary dilatation. Pancreas: Parenchymal atrophy. No ductal dilatation or inflammation. Spleen: Normal in size. Tiny hypodensity in the inferior spleen, of doubtful clinical significance. Scattered calcified granuloma. Adrenals/Urinary Tract: No adrenal nodule. No hydronephrosis, renal calculi, or suspicious renal lesion. No bladder wall thickening. Stomach/Bowel: Nondistended stomach. Equivocal gastric wall thickening versus nondistention. No small bowel obstruction or inflammation. Scattered small bowel fecalization. The appendix is normal. Colonic diverticulosis without diverticulitis. There also scattered small bowel diverticula. No colonic inflammation. Vascular/Lymphatic: Aortic and branch atherosclerosis. No aortic aneurysm. Patent portal vein. No suspicious lymphadenopathy. Reproductive: Enlarged prostate spans 5.4 cm and causes mild mass effect on the bladder base. Other: Strandy soft tissue density in the left inguinal canal has slightly contracted from prior exam and is consistent with postoperative change. No recurrent hernia. No focal fluid collection. No abdominopelvic ascites. Musculoskeletal: Degenerative change in the spine. No acute fracture of the lumbar spine or hips. Unchanged alignment of right L3 transverse process fracture from prior with interval callus. Callus formation about right L4 transverse process consistent with healing subacute fracture. IMPRESSION: 1. Postoperative change in the left inguinal canal.  No recurrent hernia. No focal fluid collection. 2. Equivocal gastric wall thickening versus nondistention. Recommend correlation for symptoms of gastritis. 3. Colonic and small bowel diverticulosis without diverticulitis. 4. Cholelithiasis without gallbladder inflammation. 5. Hepatic steatosis. 6. Enlarged prostate. Aortic Atherosclerosis (ICD10-I70.0). Electronically Signed   By: Andrea Gasman M.D.   On: 07/22/2024 20:07   DG Chest 2 View Result Date: 07/22/2024 CLINICAL DATA:  Chest pain EXAM: DG  CHEST 2V COMPARISON:  Chest x-ray 05/15/2024 FINDINGS: Heart is borderline enlarged. There is no focal lung infiltrate, pleural effusion or pneumothorax. No acute fractures are seen. IMPRESSION: Borderline cardiomegaly. No acute pulmonary process. Electronically Signed   By: Greig Pique M.D.   On: 07/22/2024 15:41    Microbiology: Results for orders placed or performed during the hospital encounter of 07/22/24  Resp panel by RT-PCR (RSV, Flu A&B, Covid) Urine, Clean Catch     Status: None   Collection Time: 07/22/24  6:10 PM   Specimen: Urine, Clean Catch; Nasal Swab  Result Value Ref Range Status   SARS Coronavirus 2 by RT PCR NEGATIVE NEGATIVE Final    Comment: (NOTE) SARS-CoV-2 target nucleic acids are NOT DETECTED.  The SARS-CoV-2 RNA is generally detectable in upper respiratory specimens during the acute phase of infection. The lowest concentration of SARS-CoV-2 viral copies this assay can detect is 138 copies/mL. A negative result does not preclude SARS-Cov-2 infection and should not be used as the sole basis for treatment or other patient management decisions. A negative result may occur with  improper specimen collection/handling, submission of specimen other than nasopharyngeal swab, presence of viral mutation(s) within the areas targeted by this assay, and inadequate number of viral copies(<138 copies/mL). A negative result must be combined with clinical observations, patient  history, and epidemiological information. The expected result is Negative.  Fact Sheet for Patients:  bloggercourse.com  Fact Sheet for Healthcare Providers:  seriousbroker.it  This test is no t yet approved or cleared by the [place]  FDA and  has been authorized for detection and/or diagnosis of SARS-CoV-2 by FDA under an Emergency Use Authorization (EUA). This EUA will remain  in effect (meaning this test can be used) for the duration of the COVID-19 declaration under Section 564(b)(1) of the Act, 21 U.S.C.section 360bbb-3(b)(1), unless the authorization is terminated  or revoked sooner.       Influenza A by PCR NEGATIVE NEGATIVE Final   Influenza B by PCR NEGATIVE NEGATIVE Final    Comment: (NOTE) The Xpert Xpress SARS-CoV-2/FLU/RSV plus assay is intended as an aid in the diagnosis of influenza from Nasopharyngeal swab specimens and should not be used as a sole basis for treatment. Nasal washings and aspirates are unacceptable for Xpert Xpress SARS-CoV-2/FLU/RSV testing.  Fact Sheet for Patients: bloggercourse.com  Fact Sheet for Healthcare Providers: seriousbroker.it  This test is not yet approved or cleared by the [place]  FDA and has been authorized for detection and/or diagnosis of SARS-CoV-2 by FDA under an Emergency Use Authorization (EUA). This EUA will remain in effect (meaning this test can be used) for the duration of the COVID-19 declaration under Section 564(b)(1) of the Act, 21 U.S.C. section 360bbb-3(b)(1), unless the authorization is terminated or revoked.     Resp Syncytial Virus by PCR NEGATIVE NEGATIVE Final    Comment: (NOTE) Fact Sheet for Patients: bloggercourse.com  Fact Sheet for Healthcare Providers: seriousbroker.it  This test is not yet approved or cleared by the [place]  FDA  and has been authorized for detection and/or diagnosis of SARS-CoV-2 by FDA under an Emergency Use Authorization (EUA). This EUA will remain in effect (meaning this test can be used) for the duration of the COVID-19 declaration under Section 564(b)(1) of the Act, 21 U.S.C. section 360bbb-3(b)(1), unless the authorization is terminated or revoked.  Performed at [facility], [address] [place], [place] [address]    *Note: Due to a large number of results and/or encounters for the  requested time period, some results have not been displayed. A complete set of results can be found in Results Review.    Labs: CBC: Recent Labs  Lab 07/22/24 1415 07/23/24 0855 07/24/24 0455  WBC 8.0 7.1 8.3  HGB 8.4* 9.2* 10.3*  HCT 24.3* 24.8* 29.5*  MCV 103.0* 103.8* 98.3  PLT 188 197 205   Basic Metabolic Panel: Recent Labs  Lab 07/22/24 1415 07/23/24 0644 07/24/24 0455  NA 140  --  140  K 3.8  --  4.3  CL 107  --  106  CO2 23  --  25  GLUCOSE 122*  --  105*  BUN 32*  --  24*  CREATININE 1.20  --  1.14  CALCIUM 8.9  --  9.8  MG  --  1.2* 1.6*   Liver Function Tests: No results for input(s): AST, ALT, ALKPHOS, BILITOT, PROT, ALBUMIN in the last 168 hours. CBG: No results for input(s): GLUCAP in the last 168 hours.  Discharge time spent: greater than 30 minutes.  Signed: AIDA CHO, MD [facility] 07/24/2024

## 2024-07-24 NOTE — Plan of Care (Signed)
 Pt is alert oriented x 4. RA. Pt ambulatory with walker and +1 assistance. Pt denies pain. No chest pain verbalized. Call button within reach.   Problem: Education: Goal: Understanding of cardiac disease, CV risk reduction, and recovery process will improve Outcome: Progressing Goal: Individualized Educational Video(s) Outcome: Progressing   Problem: Activity: Goal: Ability to tolerate increased activity will improve Outcome: Progressing   Problem: Cardiac: Goal: Ability to achieve and maintain adequate cardiovascular perfusion will improve Outcome: Progressing   Problem: Health Behavior/Discharge Planning: Goal: Ability to safely manage health-related needs after discharge will improve Outcome: Progressing   Problem: Education: Goal: Knowledge of General Education information will improve Description: Including pain rating scale, medication(s)/side effects and non-pharmacologic comfort measures Outcome: Progressing   Problem: Health Behavior/Discharge Planning: Goal: Ability to manage health-related needs will improve Outcome: Progressing   Problem: Clinical Measurements: Goal: Ability to maintain clinical measurements within normal limits will improve Outcome: Progressing Goal: Will remain free from infection Outcome: Progressing Goal: Diagnostic test results will improve Outcome: Progressing Goal: Respiratory complications will improve Outcome: Progressing Goal: Cardiovascular complication will be avoided Outcome: Progressing   Problem: Activity: Goal: Risk for activity intolerance will decrease Outcome: Progressing   Problem: Nutrition: Goal: Adequate nutrition will be maintained Outcome: Progressing   Problem: Coping: Goal: Level of anxiety will decrease Outcome: Progressing   Problem: Elimination: Goal: Will not experience complications related to bowel motility Outcome: Progressing Goal: Will not experience complications related to urinary  retention Outcome: Progressing   Problem: Pain Managment: Goal: General experience of comfort will improve and/or be controlled Outcome: Progressing   Problem: Safety: Goal: Ability to remain free from injury will improve Outcome: Progressing   Problem: Skin Integrity: Goal: Risk for impaired skin integrity will decrease Outcome: Progressing

## 2024-07-25 ENCOUNTER — Ambulatory Visit: Payer: Self-pay | Admitting: Family Medicine

## 2024-07-25 LAB — LIPOPROTEIN A (LPA): Lipoprotein (a): <8.4 nmol/L (ref <75.0)

## 2024-07-26 ENCOUNTER — Telehealth: Payer: Self-pay

## 2024-07-26 NOTE — Transitions of Care (Post Inpatient/ED Visit) (Signed)
 07/26/2024  Name: William Taylor MRN: [id number] DOB: 03/08/1930  Today's TOC FU Call Status: Today's TOC FU Call Status:: Successful TOC FU Call Completed TOC FU Call Complete Date: 07/26/24  Patient's Name and Date of Birth confirmed. DOB, Name  Transition Care Management Follow-up Telephone Call Date of Discharge: 07/24/24 Discharge Facility: [facility]) Type of Discharge: Inpatient Admission Primary Inpatient Discharge Diagnosis:: Precordial Chest Pain How have you been since you were released from the hospital?: Better Any questions or concerns?: No  Items Reviewed: Did you receive and understand the discharge instructions provided?: Yes Medications obtained,verified, and reconciled?: Yes (Medications Reviewed) Any new allergies since your discharge?: No Dietary orders reviewed?: NA Do you have support at home?: Yes People in Home [RPT]: spouse  Medications Reviewed Today: Medications Reviewed Today     Reviewed by Lavelle Charmaine NOVAK, LPN (Licensed Practical Nurse) on 07/26/24 at 1406  Med List Status: <None>   Medication Order Taking? Sig Documenting Provider Last Dose Status Informant  acetaminophen  (TYLENOL ) 500 MG tablet [id number] No Take 500 mg by mouth every 6 (six) hours as needed for moderate pain (pain score 4-6). [provider] Past Month Active Self  amiodarone  (PACERONE ) 100 MG tablet [id number] No Take 1 tablet (100 mg total) by mouth daily. Gollan, Timothy J, MD 07/22/2024 Morning Active Self  fenofibrate  micronized (ANTARA ) 130 MG capsule [id number] No TAKE 1 CAPSULE BY MOUTH DAILY  BEFORE BREAKFAST [name], MD 07/22/2024 Morning Active Self  finasteride  (PROSCAR ) 5 MG tablet [id number] No TAKE 1 TABLET BY MOUTH DAILY Stoioff, Scott C, MD 07/22/2024 Morning Active Self  levobunolol  (BETAGAN ) 0.5 % ophthalmic solution [id number] No Place 1 drop into the right eye daily. [provider] 07/22/2024 Morning Active  Self  loratadine  (CLARITIN ) 10 MG tablet [id number] No Take 1 tablet (10 mg total) by mouth daily.  Patient taking differently: Take 10 mg by mouth daily as needed for rhinitis or allergies.   Maribeth Camellia MATSU, MD Unknown Active Self  Multiple Vitamin (MULTIVITAMIN PO) [id number] No Take 1 tablet by mouth daily. [provider] 07/22/2024 Morning Active Self  nitroGLYCERIN  (NITROSTAT ) 0.4 MG SL tablet [id number] No DISSOLVE UNDER THE TONGUE 1 TABLET EVERY 5 MINUTES AS NEEDED FOR CHEST PAIN Maribeth Camellia MATSU, MD 07/22/2024  1:00 PM Active Self  pantoprazole  (PROTONIX ) 40 MG tablet [id number] No TAKE ONE (1) TABLET BY MOUTH TWO TIMES PER DAY Therisa Bi, MD 07/22/2024 Evening Active Self           Med Note VASHTI MOM J   Fri Jul 23, 2024  2:06 AM)    pravastatin  (PRAVACHOL ) 40 MG tablet [id number] No Take 1 tablet (40 mg total) by mouth at bedtime. [name], MD 07/21/2024 Active Self  XARELTO  15 MG TABS tablet [id number] No TAKE 1 TABLET BY MOUTH DAILY Gollan, Timothy J, MD 07/21/2024 Active Self           Med Note VASHTI, NANCY J   Fri Jul 23, 2024  2:02 AM) ON HOLD PER DR [facility] and Equipment/Supplies: Were Home Health Services Ordered?: NA Any new equipment or medical supplies ordered?: NA  Functional Questionnaire: Do you need assistance with bathing/showering or dressing?: No Do you need assistance with meal preparation?: Yes Do you need assistance with eating?: No Do you have difficulty maintaining continence: No Do you need assistance with getting out of bed/getting out of a  chair/moving?: No Do you have difficulty managing or taking your medications?: No  Follow up appointments reviewed: PCP Follow-up appointment confirmed?: Yes Date of PCP follow-up appointment?: 08/02/24 Follow-up Provider: Dr. Cleatus Specialist [facility] Follow-up appointment confirmed?: NA Do you need transportation to your follow-up appointment?: No Do you understand care  options if your condition(s) worsen?: Yes-patient verbalized understanding    SIGNATURE Charmaine Bloodgood, LPN [facility] Health Advisor Niland l [facility] You Are. We Are. One [facility] Direct Dial [phone]

## 2024-07-27 NOTE — Telephone Encounter (Signed)
 Noted. Thanks.

## 2024-08-02 ENCOUNTER — Encounter: Payer: Self-pay | Admitting: Family Medicine

## 2024-08-02 ENCOUNTER — Ambulatory Visit: Admitting: Family Medicine

## 2024-08-02 VITALS — BP 138/80 | HR 83 | Temp 97.4°F | Ht 76.0 in | Wt 180.0 lb

## 2024-08-02 DIAGNOSIS — D649 Anemia, unspecified: Secondary | ICD-10-CM

## 2024-08-02 DIAGNOSIS — R1030 Lower abdominal pain, unspecified: Secondary | ICD-10-CM

## 2024-08-02 LAB — COMPREHENSIVE METABOLIC PANEL WITH GFR
ALT: 10 U/L (ref 0–53)
AST: 22 U/L (ref 0–37)
Albumin: 4.2 g/dL (ref 3.5–5.2)
Alkaline Phosphatase: 32 U/L — ABNORMAL LOW (ref 39–117)
BUN: 29 mg/dL — ABNORMAL HIGH (ref 6–23)
CO2: 29 meq/L (ref 19–32)
Calcium: 10 mg/dL (ref 8.4–10.5)
Chloride: 104 meq/L (ref 96–112)
Creatinine, Ser: 1.49 mg/dL (ref 0.40–1.50)
GFR: 40.45 mL/min — ABNORMAL LOW (ref >60.00)
Glucose, Bld: 86 mg/dL (ref 70–99)
Potassium: 4.9 meq/L (ref 3.5–5.1)
Sodium: 138 meq/L (ref 135–145)
Total Bilirubin: 1.5 mg/dL — ABNORMAL HIGH (ref 0.2–1.2)
Total Protein: 6.6 g/dL (ref 6.0–8.3)

## 2024-08-02 LAB — MAGNESIUM: Magnesium: 1.4 mg/dL — ABNORMAL LOW (ref 1.5–2.5)

## 2024-08-02 LAB — CBC WITH DIFFERENTIAL/PLATELET
Basophils Absolute: 0.1 K/uL (ref 0.0–0.1)
Basophils Relative: 0.6 % (ref 0.0–3.0)
Eosinophils Absolute: 0 K/uL (ref 0.0–0.7)
Eosinophils Relative: 0.5 % (ref 0.0–5.0)
HCT: 29.2 % — ABNORMAL LOW (ref 39.0–52.0)
Hemoglobin: 10 g/dL — ABNORMAL LOW (ref 13.0–17.0)
Lymphocytes Relative: 21.7 % (ref 12.0–46.0)
Lymphs Abs: 1.9 K/uL (ref 0.7–4.0)
MCHC: 34.2 g/dL (ref 30.0–36.0)
MCV: 100.6 fl — ABNORMAL HIGH (ref 78.0–100.0)
Monocytes Absolute: 0.9 K/uL (ref 0.1–1.0)
Monocytes Relative: 9.6 % (ref 3.0–12.0)
Neutro Abs: 6 K/uL (ref 1.4–7.7)
Neutrophils Relative %: 67.6 % (ref 43.0–77.0)
Platelets: 312 K/uL (ref 150.0–400.0)
RBC: 2.9 Mil/uL — ABNORMAL LOW (ref 4.22–5.81)
RDW: 15.6 % — ABNORMAL HIGH (ref 11.5–15.5)
WBC: 8.9 K/uL (ref 4.0–10.5)

## 2024-08-02 MED ORDER — PANTOPRAZOLE SODIUM 40 MG PO TBEC: DELAYED_RELEASE_TABLET | ORAL | 1 refills | Status: AC

## 2024-08-02 NOTE — Patient Instructions (Signed)
 Go to the lab on the way out.   If you have mychart we'll likely use that to update you.    Don't do the exercises that would strain your groin in the meantime.  Take care.  Glad to see you. Let me know if you have more pain.  We'll work on getting you an appointment with Dr. Therisa.

## 2024-08-02 NOTE — Progress Notes (Unsigned)
 Admit date:     07/22/2024  Discharge date: 07/24/24  Discharge Physician: AIDA CHO    PCP: [name], MD    Recommendations at discharge:    Follow-up with PCP in 1 week   Discharge Diagnoses: Principal Problem:   Precordial chest pain, exertional (HCC) Active Problems:   Symptomatic anemia, suspect chronic GI blood loss   History of pulmonary embolism (7993,7987)   H/O left inguinal hernia repair 02/27/2024   Paroxysmal atrial fibrillation (HCC)   CKD (chronic kidney disease), stage IIIa   Coronary artery disease of native artery of native heart with stable angina pectoris   Non-ST elevation (NSTEMI) myocardial infarction [facility])   Thoracic ascending aortic aneurysm   Macrocytic anemia   Rapid atrial fibrillation (HCC)   Resolved Problems:   * No resolved hospital problems. *   Hospital Course:   William Taylor is a 89 y.o. male  with medical history significant for CAD s/p LAD stent(nonobstructive CAD on [facility] 2023), pAF on amiodarone  and Xarelto , prior pulmonary embolism (2006, 2012), HTN, orthostatic hypotension, chronic atypical chest pain, ascending aortic aneurysm, CKD stage IIIa, headaches/occipital neuralgia, history of left inguinal hernia repair with mesh July 2025 complicated by surgical site infection shortly thereafter.  He presented to the hospital with chest pain.  He said he was walking to the mailbox when he noticed he had difficulty walking because of the chest pain.  He took 2 tablets of nitroglycerin  without any relief.  He said he also felt short of breath and tired so he said I am always short of breath.  EMS was called and he was given 4 tablets of baby aspirin  and route to the ED. Of note, patient's last cath was in 2023 and showed nonobstructive CAD. On his last office visit to cardiology in October 2024 he is noted to have a history of chronic chest pain dating back several years    He also complained of pain in the left inguinal area.  He had  left inguinal hernia surgery in July 2025 which he said was complicated by infection and had to be kept overnight after surgery.  He said he had been doing well until recently when he noticed pain in the left groin.  He said in the ED, the left groin area was very tender to palpation.     Vitals in the ED: Temperature 98.1, respiratory rate 18, pulse 115, BP 121/73, O2 sat 98% on room air.     EKG showed atrial fibrillation with RVR   Troponins 30, 32 proBNP 726         Assessment and Plan:     Chest pain, mildly elevated troponins, CAD s/p prior PCI: Troponins are flat.  No evidence of ACS.  Nuclear stress test was negative for ischemia, LV perfusion normal and the study was reported as a low risk with normal LV function and nuclear stress EF 66%. 2D echo showed EF estimated at 60 to 65%, grade 1 diastolic dysfunction, moderately dilated left atrium, aortic dilation and mild dilation of the ascending aorta. Continue pravastatin .     Paroxysmal atrial fibrillation with RVR: Heart rate is better.  Continue amiodarone .  Resume Xarelto  at discharge.  He understands that he is still at increased risk for bleeding given advanced age. He has been advised to stop Xarelto  and notify his PCP or cardiologist if he develops any bleeding complications.     ?  One episode of rectal bleeding: He said he used the  commode and saw small amount of blood in the commode but he was not sure whether the blood was coming from the urine or from the rectum.  No further bleeding episodes. He was evaluated by Dr. Jinny, gastroenterologist.  No plan for inpatient endoscopic workup.  Outpatient follow-up with Dr. Therisa, gastroenterologist, recommended.     Acute on chronic anemia: H&H stable.  Hemoglobin up from 8.4-9.2-10.3 without any blood transfusion.      Left inguinal pain, history of left inguinal hernia surgery in July 2025: Pain is better.  CT abdomen and pelvis did not show any recurrence of hernia or  focal fluid collection in inguinal canal.  However, patient is convinced that there is something wrong and requested evaluation by the surgeon.   He was evaluated by Dr. Marinda, surgeon, as requested.  No acute issues from left inguinal region discomfort. He can follow-up with Dr. Marinda, general surgeon, as an outpatient if left inguinal pain persist.     Hypomagnesemia: Magnesium  was 1.2.  He was given IV magnesium  sulfate and magnesium  came up to 1.6.  He was given another dose of IV magnesium  sulfate 2 g prior to discharge.  Outpatient follow-up with PCP recommended to monitor electrolytes.   ==================== Inpatient course discussed with patient.  Admitted with chest discomfort/shortness of breath.  Workup as above.  He improved to the point where he could be discharged.  No CP now.  This resolved in the meantime.    History of anemia noted.  He is back on xarelto .  No more blood in stool. No black stools.  D/w pt about seeing Dr. Therisa with GI.  He needs help getting an appointment.   He needed refill on pantoprazole .  Rx sent today.   D/w pt about inguinal pain and limiting abd wall straining for now.  He had reassuring exam as an inpatient.Groin pain is better now but that is with him laying off the exercises that could have provoked abdominal wall pain.  Meds, vitals, and allergies reviewed.   ROS: Per HPI unless specifically indicated in ROS section   Nad [place] Neck supple, no LA RRR Ctab Abd soft No BLE edema.   33 minutes were devoted to patient care in this encounter (this includes time spent reviewing the patient's file/history, interviewing and examining the patient, counseling/reviewing plan with patient).

## 2024-08-04 ENCOUNTER — Ambulatory Visit: Payer: Self-pay | Admitting: Family Medicine

## 2024-08-04 DIAGNOSIS — R1030 Lower abdominal pain, unspecified: Secondary | ICD-10-CM | POA: Insufficient documentation

## 2024-08-04 DIAGNOSIS — D649 Anemia, unspecified: Secondary | ICD-10-CM

## 2024-08-04 NOTE — Assessment & Plan Note (Signed)
 Would limit straining abdominal wall in the meantime.  Previous inpatient workup discussed with patient.

## 2024-08-04 NOTE — Assessment & Plan Note (Addendum)
 Discussed options.  No more blood in stool per patient report.  Continue Xarelto  for now.  Refer back to GI.  Continue pantoprazole .  See notes on follow-up labs.

## 2024-08-05 ENCOUNTER — Encounter: Payer: Self-pay | Admitting: Family Medicine

## 2024-08-06 ENCOUNTER — Emergency Department
Admission: EM | Admit: 2024-08-06 | Discharge: 2024-08-06 | Disposition: A | Discharge status: Home / Self Care | Attending: Emergency Medicine | Admitting: Emergency Medicine

## 2024-08-06 ENCOUNTER — Other Ambulatory Visit: Payer: Self-pay

## 2024-08-06 DIAGNOSIS — I2510 Atherosclerotic heart disease of native coronary artery without angina pectoris: Secondary | ICD-10-CM | POA: Diagnosis not present

## 2024-08-06 DIAGNOSIS — R1032 Left lower quadrant pain: Secondary | ICD-10-CM | POA: Diagnosis present

## 2024-08-06 DIAGNOSIS — I130 Hypertensive heart and chronic kidney disease with heart failure and stage 1 through stage 4 chronic kidney disease, or unspecified chronic kidney disease: Secondary | ICD-10-CM | POA: Insufficient documentation

## 2024-08-06 DIAGNOSIS — Z7901 Long term (current) use of anticoagulants: Secondary | ICD-10-CM | POA: Diagnosis not present

## 2024-08-06 DIAGNOSIS — I509 Heart failure, unspecified: Secondary | ICD-10-CM | POA: Diagnosis not present

## 2024-08-06 DIAGNOSIS — K648 Other hemorrhoids: Secondary | ICD-10-CM | POA: Insufficient documentation

## 2024-08-06 DIAGNOSIS — N189 Chronic kidney disease, unspecified: Secondary | ICD-10-CM | POA: Insufficient documentation

## 2024-08-06 LAB — COMPREHENSIVE METABOLIC PANEL WITH GFR
ALT: 10 U/L (ref 0–44)
AST: 27 U/L (ref 15–41)
Albumin: 4.4 g/dL (ref 3.5–5.0)
Alkaline Phosphatase: 34 U/L — ABNORMAL LOW (ref 38–126)
Anion gap: 9 (ref 5–15)
BUN: 30 mg/dL — ABNORMAL HIGH (ref 8–23)
CO2: 25 mmol/L (ref 22–32)
Calcium: 9.7 mg/dL (ref 8.9–10.3)
Chloride: 105 mmol/L (ref 98–111)
Creatinine, Ser: 1.41 mg/dL — ABNORMAL HIGH (ref 0.61–1.24)
GFR, Estimated: 47 mL/min — ABNORMAL LOW
Glucose, Bld: 78 mg/dL (ref 70–99)
Potassium: 5 mmol/L (ref 3.5–5.1)
Sodium: 139 mmol/L (ref 135–145)
Total Bilirubin: 1.2 mg/dL (ref 0.0–1.2)
Total Protein: 6.8 g/dL (ref 6.5–8.1)

## 2024-08-06 LAB — PROTIME-INR
INR: 1.8 — ABNORMAL HIGH (ref 0.8–1.2)
Prothrombin Time: 21.6 s — ABNORMAL HIGH (ref 11.4–15.2)

## 2024-08-06 LAB — CBC
HCT: 27.8 % — ABNORMAL LOW (ref 39.0–52.0)
Hemoglobin: 9.3 g/dL — ABNORMAL LOW (ref 13.0–17.0)
MCH: 33.7 pg (ref 26.0–34.0)
MCHC: 33.5 g/dL (ref 30.0–36.0)
MCV: 100.7 fL — ABNORMAL HIGH (ref 80.0–100.0)
Platelets: 265 K/uL (ref 150–400)
RBC: 2.76 MIL/uL — ABNORMAL LOW (ref 4.22–5.81)
RDW: 15.8 % — ABNORMAL HIGH (ref 11.5–15.5)
WBC: 9 K/uL (ref 4.0–10.5)
nRBC: 0 % (ref 0.0–0.2)

## 2024-08-06 NOTE — ED Provider Notes (Signed)
 "  [facility] Provider Note    Event Date/Time   First MD Initiated Contact with Patient 08/06/24 1727     (approximate)  History   Chief Complaint: Rectal Bleeding  HPI  VALE MOUSSEAU is a 89 y.o. male with a past medical history of CHF, CKD, CAD, prior CVA, gastric reflux, hypertension, on Xarelto , presents to the emergency department with continued left lower quadrant abdominal pain as well as some blood noted in his stool.  According to the patient for the past several weeks he has been experiencing pain in the left lower quadrant of his abdomen.  Patient had a prior hernia repair to this area.  Patient was seen here more for chest pain 2 weeks ago but they did include a CT scan of the abdomen and pelvis at that time showing postoperative changes without acute abnormality.  He states the pain has remained fairly constant since, but today he noted some blood in his bowel movement and thought the stool appeared more dark he was worried so he called his doctor who recommended he come to the emergency department for evaluation.  Here the patient appears well he states the abdominal pain has improved significantly after his last bowel movement.  Patient states a history of hemorrhoids and will occasionally have bleeding from the hemorrhoids.  Physical Exam   Triage Vital Signs: ED Triage Vitals  Encounter Vitals Group     BP 08/06/24 1253 (!) 185/87     Girls Systolic BP Percentile --      Girls Diastolic BP Percentile --      Boys Systolic BP Percentile --      Boys Diastolic BP Percentile --      Pulse Rate 08/06/24 1253 76     Resp 08/06/24 1253 16     Temp 08/06/24 1253 97.6 F (36.4 C)     Temp Source 08/06/24 1253 Oral     SpO2 08/06/24 1253 100 %     Weight 08/06/24 1255 179 lb 14.3 oz (81.6 kg)     Height 08/06/24 1255 6' 4 (1.93 m)     Head Circumference --      Peak Flow --      Pain Score 08/06/24 1253 0     Pain Loc --      Pain Education --       Exclude from Growth Chart --     Most recent vital signs: Vitals:   08/06/24 1253 08/06/24 1711  BP: (!) 185/87 (!) 168/138  Pulse: 76 74  Resp: 16 20  Temp: 97.6 F (36.4 C)   SpO2: 100% 100%    General: Awake, no distress.  CV:  Good peripheral perfusion.  Regular rate and rhythm  Resp:  Normal effort.  Equal breath sounds bilaterally.  Abd:  No distention.  Soft, minimal left lower quadrant tenderness more over the left inguinal canal area.  No rebound or guarding.  Abdomen otherwise benign.  ED Results / Procedures / Treatments   MEDICATIONS ORDERED IN ED: Medications - No data to display   IMPRESSION / MDM / ASSESSMENT AND PLAN / ED COURSE  I reviewed the triage vital signs and the nursing notes.  Patient's presentation is most consistent with acute presentation with potential threat to life or bodily function.  Patient presents to the emergency department for continued left lower quadrant abdominal pain that has been ongoing for about a month as well as some blood in his stool.  Patient has a history of hemorrhoids but states the stool appeared darker so he was concerned so he came to the emergency department.  Here patient's workup is overall reassuring CBC shows hemoglobin of 9.3 which is down from ten 1 week ago but unchanged from his labs from 2 to 3 weeks ago.  Reassuringly normal white blood cell count.  Chemistry shows no significant findings.  Patient's INR is 1.8.  Patient is on Xarelto .  Patient has minimal tenderness over the left inguinal canal no obvious hernia palpated.  No rebound guarding and abdomen is otherwise benign.  Patient states this has been ongoing for about a month I reviewed the patient's last abdominal CT from 12//25 showing no significant findings to this area.  Patient has a GI appointment in about 1 week for the same.  Patient is rectal exam today does show moderate external hemorrhoids however the stool appears brown and is currently guaiac  negative.  I offered to repeat the CT scan however the patient states with a normal CT 2 weeks ago and a GI a follow-up appointment in about a week he would prefer to wait to speak to the GI doctor.  States they have been here all day and he is ready go home.  Given the patient's guaiac negative rectal exam I suspect that the patient could have been experiencing some hemorrhoidal bleeding.  However provided my typical GI bleed return precautions to return should he develop any black stool or significant amount of bleeding.  Patient agreeable to plan of care. FINAL CLINICAL IMPRESSION(S) / ED DIAGNOSES   Abdominal pain   Note:  This document was prepared using Dragon voice recognition software and may include unintentional dictation errors.   Dorothyann Drivers, MD 08/06/24 1806  "

## 2024-08-06 NOTE — Discharge Instructions (Signed)
 Please follow-up with your GI doctor as scheduled regarding your left lower quadrant abdominal pain.  Should your pain worsen or if you develop significant bleeding please return to the emergency department for further evaluation.

## 2024-08-06 NOTE — ED Triage Notes (Signed)
 Pt to ED ambulatory with cane from home with wife for rectal bleeding since this AM. Pt was here 2 weeks ago seen for chest pain. Pt had BM this morning then had another that was bloody and also black. States did have LLQ abdominal discomfort for 1 month which resolved after BM this AM. Pt takes Xarelto , no recent dosage changes. Pt appears pale. Respirations are unlabored. Denies dizziness and weakness. Blue top sent with labs.

## 2024-08-07 LAB — TYPE AND SCREEN
ABO/RH(D): A POS
Antibody Screen: POSITIVE

## 2024-08-11 ENCOUNTER — Telehealth: Payer: Self-pay | Admitting: Cardiovascular Disease

## 2024-08-11 NOTE — Telephone Encounter (Signed)
 Patient with diagnosis of afib on Xarelto  for anticoagulation.    Procedure: flex sig  Date of procedure: 08/26/24   CHA2DS2-VASc Score = 7   This indicates a 11.2% annual risk of stroke. The patient's score is based upon: CHF History: 1 HTN History: 1 Diabetes History: 0 Stroke History: 2 Vascular Disease History: 1 Age Score: 2 Gender Score: 0      CrCl 38 ml/min Platelet count 265  Patient has not had an Afib/aflutter ablation in the last 3 months, DCCV within the last 4 weeks or a watchman implanted in the last 45 days   Per office protocol, patient can hold Xarelto  for 2 days prior to procedure.    **This guidance is not considered finalized until pre-operative APP has relayed final recommendations.**

## 2024-08-11 NOTE — Telephone Encounter (Signed)
"  ° °  Pre-operative Risk Assessment    Patient Name: William Taylor  DOB: [birth year] MRN: [id number]   Date of last office visit: unknown Date of next office visit: unknown   Request for Surgical Clearance    Procedure:  flex sig  Date of Surgery:  Clearance 08/26/24                                Surgeon:  unknown Surgeon's Group or Practice Name:  [facility] Phone number:  [phone] Fax number:  [phone]   Type of Clearance Requested:   - Medical    Type of Anesthesia:  General    Additional requests/questions:    SignedBerwyn LELON Sprung   08/11/2024, 10:07 AM    "

## 2024-08-11 NOTE — Telephone Encounter (Signed)
" ° ° °  Primary Cardiologist:Timothy Perla, MD  Chart reviewed as part of pre-operative protocol coverage. Because of Deuntae Kocsis [place]addle's past medical history and time since last visit, he/she will require a follow-up office  visit in order to better assess preoperative cardiovascular risk.  Pre-op covering staff: - Please schedule appointment and call patient to inform them. - Please contact requesting surgeon's office via preferred method (i.e, phone, fax) to inform them of need for appointment prior to surgery.  If applicable, this message will also be routed to pharmacy pool and/or primary cardiologist for input on holding anticoagulant/antiplatelet agent as requested below so that this information is available at time of patient's appointment.   Pharmacy has provided recommendations for anticoagulation hold.  Josefa CHRISTELLA Beauvais, NP  08/11/2024, 12:05 PM   "

## 2024-08-13 ENCOUNTER — Telehealth: Payer: Self-pay

## 2024-08-13 NOTE — Telephone Encounter (Signed)
 Called and spoke to the patient and he is unable to come to [facility] for a pre-op clearance in-office offer. He told me, Ma'am, I am 89 years old, I don't really drive. I advised him that we will try to put him somewhere that we can. He will be called back once we find an opening for him.

## 2024-08-13 NOTE — Telephone Encounter (Signed)
 Patient is scheduled for pre-op clearance on 08/18/24 with Cadence Furth, PA-C.

## 2024-08-18 ENCOUNTER — Ambulatory Visit: Attending: Medical | Admitting: Medical

## 2024-08-18 ENCOUNTER — Encounter: Payer: Self-pay | Admitting: Medical

## 2024-08-18 VITALS — BP 130/72 | HR 92 | Ht 76.0 in | Wt 187.8 lb

## 2024-08-18 DIAGNOSIS — I480 Paroxysmal atrial fibrillation: Secondary | ICD-10-CM

## 2024-08-18 DIAGNOSIS — I25118 Atherosclerotic heart disease of native coronary artery with other forms of angina pectoris: Secondary | ICD-10-CM

## 2024-08-18 DIAGNOSIS — Z01810 Encounter for preprocedural cardiovascular examination: Secondary | ICD-10-CM

## 2024-08-18 DIAGNOSIS — E782 Mixed hyperlipidemia: Secondary | ICD-10-CM | POA: Diagnosis not present

## 2024-08-18 DIAGNOSIS — I10 Essential (primary) hypertension: Secondary | ICD-10-CM | POA: Diagnosis not present

## 2024-08-18 NOTE — Patient Instructions (Signed)
 Medication Instructions:   Your physician recommends that you continue on your current medications as directed. Please refer to the Current Medication list given to you today.   *If you need a refill on your cardiac medications before your next appointment, please call your pharmacy*  Lab Work:  No labs ordered today  If you have labs (blood work) drawn today and your tests are completely normal, you will receive your results only by: MyChart Message (if you have MyChart) OR A paper copy in the mail If you have any lab test that is abnormal or we need to change your treatment, we will call you to review the results.  Testing/Procedures:  No test ordered today   Follow-Up: At [facility], you and your health needs are our priority.  As part of our continuing mission to provide you with exceptional heart care, our providers are all part of one team.  This team includes your primary Cardiologist (physician) and Advanced Practice Providers or APPs (Physician Assistants and Nurse Practitioners) who all work together to provide you with the care you need, when you need it.  Your next appointment:   4 month(s)  Provider:     Cadence Gennaro Khat, PA-C  We recommend signing up for the patient portal called "MyChart".  Sign up information is provided on this After Visit Summary.  MyChart is used to connect with patients for Virtual Visits (Telemedicine).  Patients are able to view lab/test results, encounter notes, upcoming appointments, etc.  Non-urgent messages can be sent to your provider as well.   To learn more about what you can do with MyChart, go to [email or web address].

## 2024-08-18 NOTE — Progress Notes (Signed)
 " Cardiology Office Note   Date:  08/18/2024  ID:  William Taylor, William Taylor 1930-05-07, MRN [id number] PCP: [name], MD  [facility] Providers Cardiologist:  [name], MD   History of Present Illness William Taylor is a 89 y.o. male with a h/o CAD with stent placement to the LAD in 2005 and to OM 2 in 2012 and chronic chest pain, paroxysmal Afib, h/o PE, orthostatic hypotension, HTN, HLD, PVCs, CKD stage 3, and chronic fatigue who is being seen for hospital follow-up and pre-operative evaluation.   [name] underwent stent placement in 2005 to the LAD.  He was noted to be in atrial fibrillation with RVR in the setting of UTI 04/2010.  Cardiac catheterization 12/2010 showed 90% OM 2 disease and he was transferred to [facility] where he underwent successful PCI/DES.  Cardiac catheterization 10/2013 showed small vessel disease and patent stents.  He unfortunately developed PE following his catheterization and was started on anticoagulation.  Stress test 06/2019 showed no significant ischemia and was considered low risk.  Cardiac MRI revealed severe asymmetrical septal hypertrophy 1.6 cm without outflow tract obstruction.  Echo 09/2020 with EF 60 to 65% with severe asymmetric basal septal hypertrophy.  He was hospitalized 08/2021 with COVID and was treated with remdesivir  and IV steroids.  Noted to have orthostatic hypotension during this day.  Most recent cardiac catheterization 04/2022 revealed patent LAD and OM 3 stents with moderate nonobstructive CAD and medical management was recommended.  He was evaluated in the ED 04/2023 with complaints of chest pain.  Unrelieved by nitroglycerin .  This was similar to previous episodes in the past.  Workup was overall unrevealing with negative troponin and EKG without acute ischemic changes.  He followed up later that month and reported several visits to the emergency department for chest pain with unrevealing workup.  There was discussion for  addition of long-acting nitrate but this was deferred given history of orthostatic hypotension.  Most recent echo 05/2023 showed EF 60 to 65% with G1 DD and no significant valvular abnormalities.  Mild dilation of the aortic root at 40 mm and mild dilation of the ascending aorta at 41 mm.   The patient was admitted 07/23/2024 for chest pain with mildly elevated troponin to 32.  Initial EKG showed A-fib RVR, which improved with IV metoprolol .  Patient converted to normal sinus rhythm.  Lexiscan  Myoview  was normal, no ischemia, and low risk.  It was noted hemoglobin was 8.4 with a baseline of 10.  It improved to 9.2.  He was evaluated by GI who did not feel labs were consistent with acute GI bleed.  He was resumed on Xarelto .  Today the patient is here for pre-op evaluation for sigmoid oscopy for rectal bleeding. He reports intermittent BRBPR.  Patient denies any further chest pain episodes.  Breathing is good. He has stable lower leg edema. He reports occasional lightheadedness and dizziness.  He was on prednisone  for inflammation of ligament.  Patient denies history of vertigo.  He denies heart racing or palpitations. He is still taking Xarelto  despite intermittent rectal bleeding.   He sometimes uses a walker, but used to use a cane. He can walk 1-2 blocks with a walker. Cannot walk up a flight of stairs. Can do light work at home.   Studies Reviewed EKG Interpretation Date/Time:  Wednesday August 18 2024 15:01:27 EST Ventricular Rate:  92 PR Interval:  164 QRS Duration:  90 QT Interval:  364 QTC Calculation: 450  R Axis:   -41  Text Interpretation: Normal sinus rhythm Left axis deviation When compared with ECG of 24-Jul-2024 05:10, PR interval has decreased Incomplete right bundle branch block is no longer Present Confirmed by Franchester, Jannely Henthorn ([id number]) on 08/18/2024 3:08:28 PM    MPI 07/2024   Vasodilator stress ECG was negative for ischemia.   LV perfusion is normal. Diaphragmatic attenuation  artifact present.   Left ventricular function is normal. Nuclear stress EF: 66%. No TID.   The study is low risk.  Echo 07/2024 1. Left ventricular ejection fraction, by estimation, is 60 to 65%. The  left ventricle has normal function. Left ventricular endocardial border  not optimally defined to evaluate regional wall motion. There is severe  asymmetric left ventricular  hypertrophy of the basal-septal segment. Left ventricular diastolic  parameters are consistent with Grade I diastolic dysfunction (impaired  relaxation). No SAM or LVOT obstruction visualized.   2. Right ventricular systolic function is normal. The right ventricular  size is normal.   3. Left atrial size was moderately dilated.   4. The mitral valve is degenerative. No evidence of mitral valve  regurgitation. No evidence of mitral stenosis. Moderate mitral annular  calcification.   5. The aortic valve is tricuspid. There is mild calcification of the  aortic valve. Aortic valve regurgitation is mild. Aortic valve  sclerosis/calcification is present, without any evidence of aortic  stenosis.   6. Aortic dilatation noted. There is mild dilatation of the ascending  aorta, measuring 43 mm.   Physical Exam VS:  BP 130/72 (BP Location: Left Arm, Patient Position: Sitting, Cuff Size: Normal)   Pulse 92   Ht 6' 4 (1.93 m)   Wt 187 lb 12.8 oz (85.2 kg)   SpO2 98%   BMI 22.86 kg/m        Wt Readings from Last 3 Encounters:  08/18/24 187 lb 12.8 oz (85.2 kg)  08/06/24 179 lb 14.3 oz (81.6 kg)  08/02/24 180 lb (81.6 kg)    GEN: Well nourished, well developed in no acute distress NECK: No JVD; No carotid bruits CARDIAC: RRR, no murmurs, rubs, gallops RESPIRATORY:  Clear to auscultation without rales, wheezing or rhonchi  ABDOMEN: Soft, non-tender, non-distended EXTREMITIES:  No edema; No deformity   ASSESSMENT AND PLAN  Pre-operative cardiac evaluation for sigmoidoscopy CAD pAfib HTN with h/o orthostatic  hypotension HLD Patient is needing sigmoidoscopy for intermittent rectal bleeding for the last month. Most recent Hgb 9.3, which is overall stable.  Patient is still taking Xarelto  daily.  From a cardiac perspective patient is overall okay.  He was seen in the hospital early December for chest pain and admission.  Myoview  Lexi scan was nonischemic.  Echo showed normal pump function.  He denies any further chest pain.  Patient has chronic mild lower leg edema that is stable.  He has occasional orthostatic symptoms, which is known.  He is in normal sinus rhythm on EKG.    Function is limited.  He uses a cane or a walker.  Reports he is able to walk 1-2 blocks with a walker.  He cannot walk up a flight of stairs.  No further cardiac workup prior to procedure.He has been instructed to hold Xarelto  2 days prior to the procedure. METS 3.97 RCRI= 1.1% risk of Mace    Dispo: Follow-up in 4 months  Signed, Shwanda Soltis VEAR Franchester, PA-C   "

## 2024-08-18 NOTE — Telephone Encounter (Signed)
 Caller Randa) is following-up on the status of patient's clearance.

## 2024-08-18 NOTE — Telephone Encounter (Signed)
 Preop clearance from 12/31 OV has now been routed to requesting surgeon's office

## 2024-08-24 ENCOUNTER — Encounter: Payer: Self-pay | Admitting: Urology

## 2024-08-24 ENCOUNTER — Ambulatory Visit (INDEPENDENT_AMBULATORY_CARE_PROVIDER_SITE_OTHER): Admitting: Urology

## 2024-08-24 VITALS — BP 156/89 | HR 86 | Ht 76.0 in | Wt 182.0 lb

## 2024-08-24 DIAGNOSIS — N401 Enlarged prostate with lower urinary tract symptoms: Secondary | ICD-10-CM

## 2024-08-24 DIAGNOSIS — R351 Nocturia: Secondary | ICD-10-CM

## 2024-08-24 NOTE — Progress Notes (Signed)
 "  08/24/2024 1:14 PM   William Taylor 1930/03/25 [id number]  Referring provider: [name], MD [address] [place],  [place] [address]  Chief Complaint  Patient presents with   Benign Prostatic Hypertrophy   Urologic history: 1. 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 Ike; PSA 5.7; 77 g   focus Gleason 3+3 RLB Elected active surveillance; started finasteride  Baseline PSA 0.1-0.6 since 2015   HPI: William Taylor is a 89 y.o. male who presents for annual follow-up.  No significant changes since last year's visit.  Has nocturia every 2 hours and some postvoid dribbling.  Previously tried on Myrbetriq  without improvement Hospitalized early December for chest pain and anemia.  Scheduled for colonoscopy later this week Remains on finasteride    PMH: Past Medical History:  Diagnosis Date   Acquired dilation of ascending aorta and aortic root    Aortic atherosclerosis    Atypical chest pain    BPH (benign prostatic hyperplasia)    Cerebral microvascular disease    CHF (congestive heart failure) (HCC)    CKD (chronic kidney disease), stage III (HCC)    Colon polyp    Compression fracture of T2 vertebra (HCC) 02/26/2023   Coronary artery disease    a.) PCI LAD (Cypher DES) in 2005; b.)  LHC 2009: no obstructive CAD and patent stent; c.) PCI OM2 12/26/2010 (2.25 x 16 mm Promus Element); d.) LHC 10/28/2013: LAD 60m ISR, 27m, D1 90, D2 70, OM2 80 w/ patent stent;  e.) 06/2019 Low risk MV; f.) 07/2020 MV: EF 77%, no ischemia/infarct.   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)    IDA (iron deficiency anemia)    Left inguinal hernia    Leg  weakness    Long term current use of amiodarone     Melanoma (HCC)    Multinodular thyroid     NSTEMI (non-ST elevated myocardial infarction) (HCC) 10/26/2013   a.) experienced in setting of antithrombotic hold (clopidogrel ) for routine colonoscopy; b.) troponins trended: 1.00 --> 5.10 --> 6.20 ng/mL; c.) LHC 10/31/2013: 10/40% mLAD, 90/70% D1, 80% OM1 -- med mgmt   NSTEMI (non-ST elevated myocardial infarction) (HCC) 12/24/2010   a.) LHC 12/24/2010: 30 % LAD, 30% ISR mLAD, 50% dLAD, 95% OM2 --> d/t distal bifurcation lesion unable to see RCA --> transfer to [facility] --> PCI 12/26/2010 placing a 2.25 x 16 mm Promus Element DES to OM2   On rivaroxaban  therapy    Orthostatic hypotension    a. prev on midodrine /florinef /northera .   PAF (paroxysmal atrial fibrillation) (HCC)    a.) CHA2DS2VASc = 6 (age x2, HTN, CVA/PE x 2, prior MI/vascular disease) as of 02/24/2024; b.) rate/rhythm maintained on oral amiodarone ; chronically anticoagulated with rivaroxaban    Prostate cancer (HCC)    PSVT (paroxysmal supraventricular tachycardia)    Pulmonary embolism (HCC) 2006   Skin cancer    Wears dentures    partial upper and lower   Wears hearing aid in both ears     Surgical History: Past Surgical History:  Procedure Laterality Date   CATARACT EXTRACTION Right 05/22/2011   CATARACT EXTRACTION W/PHACO Left 10/01/2023   Procedure: CATARACT EXTRACTION PHACO  AND INTRAOCULAR LENS PLACEMENT (IOC) LEFT 23.44 01:56.2;  Surgeon: Mittie Gaskin, MD;  Location: [facility];  Service: Ophthalmology;  Laterality: Left;   colonoscopy     COLONOSCOPY W/ POLYPECTOMY  08/19/2013   Dr Jeri   COLONOSCOPY WITH PROPOFOL  N/A 08/15/2016   Procedure: COLONOSCOPY WITH PROPOFOL ;  Surgeon: Ruel Kung, MD;  Location: [facility] END[facility]COPY;  Service: Endoscopy;  Laterality: N/A;   CORONARY ANGIOPLASTY WITH STENT PLACEMENT Left 2005   Procedure: CORONARY ANGIOPLASTY WITH STENT PLACEMENT; Location[facility])    CORONARY ANGIOPLASTY WITH STENT PLACEMENT Left 12/26/2010   Procedure: CORONARY ANGIOPLASTY WITH STENT PLACEMENT; Location: [facility]; Surgeon: Peter Jordan, MD   ENTEROSCOPY N/A 09/22/2018   Procedure: ENTEROSCOPY;  Surgeon: Unk Corinn Skiff, MD;  Location: [facility] ENDOSCOPY;  Service: Gastroenterology;  Laterality: N/A;   ESOPHAGOGASTRODUODENOSCOPY (EGD) WITH PROPOFOL  N/A 08/15/2016   Procedure: ESOPHAGOGASTRODUODENOSCOPY (EGD) WITH PROPOFOL ;  Surgeon: Ruel Kung, MD;  Location: [facility] ENDOSCOPY;  Service: Endoscopy;  Laterality: N/A;   GIVENS CAPSULE STUDY N/A 09/26/2016   Procedure: GIVENS CAPSULE STUDY;  Surgeon: Ruel Kung, MD;  Location: [facility] ENDOSCOPY;  Service: Endoscopy;  Laterality: N/A;   HERNIA REPAIR  08/20/2011   INGUINAL HERNIA REPAIR Left 02/27/2024   Procedure: REPAIR, HERNIA, INGUINAL, ADULT, RECURRENT;  Surgeon: Marinda Jayson KIDD, MD;  Location: [facility] ORS;  Service: General;  Laterality: Left;  open procedure with mesh   LEFT HEART CATH AND CORONARY ANGIOGRAPHY N/A 04/29/2022   Procedure: LEFT HEART CATH AND CORONARY ANGIOGRAPHY;  Surgeon: Darron Deatrice LABOR, MD;  Location: [facility] INVASIVE CV LAB;  Service: Cardiovascular;  Laterality: N/A;   LEFT HEART CATH AND CORONARY ANGIOGRAPHY Left 2009   Procedure: LEFT HEART CATH AND CORONARY ANGIOGRAPHY; Location: [facility])   LEFT HEART CATH AND CORONARY ANGIOGRAPHY Left 12/24/2010   Procedure: LEFT HEART CATH AND CORONARY ANGIOGRAPHY; Locaiton: [facility]; Surgeon: [name], MD   LEFT HEART CATH AND CORONARY ANGIOGRAPHY Left 10/28/2013   Procedure: LEFT HEART CATH AND CORONARY ANGIOGRAPHY; Location: [facility]; Surgeon: [name], MD   LUMBAR SPINE SURGERY     x2 in [facility] in [phone]   UPPER GI ENDOSCOPY  04/19/2014   Dr Jeri    Home Medications:  Allergies as of 08/24/2024       Reactions   Bee Venom Swelling   Sulfasalazine Hives, Swelling   Warfarin    Chest pain        Medication List        Accurate  as of August 24, 2024  1:14 PM. If you have any questions, ask your nurse or doctor.          acetaminophen  500 MG tablet Commonly known as: TYLENOL  Take 500 mg by mouth every 6 (six) hours as needed for moderate pain (pain score 4-6).   amiodarone  100 MG tablet Commonly known as: PACERONE  Take 1 tablet (100 mg total) by mouth daily.   fenofibrate  micronized 130 MG capsule Commonly known as: ANTARA  TAKE 1 CAPSULE BY MOUTH DAILY  BEFORE BREAKFAST   finasteride  5 MG tablet Commonly known as: PROSCAR  TAKE 1 TABLET BY MOUTH DAILY   levobunolol  0.5 % ophthalmic solution Commonly known as: BETAGAN  Place 1 drop into the right eye daily.   loratadine  10 MG tablet Commonly known as: CLARITIN  Take 1 tablet (10 mg total) by mouth daily.   MULTIVITAMIN PO Take 1 tablet by mouth daily.   nitroGLYCERIN  0.4 MG SL tablet Commonly known as: NITROSTAT  DISSOLVE UNDER THE TONGUE 1  TABLET EVERY 5 MINUTES AS NEEDED FOR CHEST PAIN   pantoprazole  40 MG tablet Commonly known as: PROTONIX  TAKE ONE (1) TABLET BY MOUTH TWO TIMES PER DAY   pravastatin  40 MG tablet Commonly known as: PRAVACHOL  Take 1 tablet (40 mg total) by mouth at bedtime.   Xarelto  15 MG Tabs tablet Generic drug: Rivaroxaban  TAKE 1 TABLET BY MOUTH DAILY        Allergies: Allergies[1]  Family History: Family History  Problem Relation Age of Onset   Stroke Mother    Colon cancer Father    Coronary artery disease Brother    Other Brother 19       lymes dz    Social History:  reports that he has never smoked. He has never used smokeless tobacco. He reports that he does not drink alcohol and does not use drugs.   Physical Exam: BP (!) 156/89   Pulse 86   Ht 6' 4 (1.93 m)   Wt 182 lb (82.6 kg)   BMI 22.15 kg/m   Constitutional:  Alert, No acute distress. HEENT: [place] AT Respiratory: Normal respiratory effort, no increased work of breathing. Psychiatric: Normal mood and affect.   Assessment & Plan:     1.  BPH with LUTS Stable Continue finasteride  States nocturia is not bothersome enough that he desires additional treatment  2.  Low risk prostate cancer PSA testing has been discontinued   William JAYSON Barba, MD  [facility] [address], [address] [place], [place] [address] [phone]    [1]  Allergies Allergen Reactions   Bee Venom Swelling   Sulfasalazine Hives and Swelling   Warfarin     Chest pain   "

## 2024-08-25 ENCOUNTER — Encounter: Payer: Self-pay | Admitting: Certified Registered"

## 2024-08-25 ENCOUNTER — Other Ambulatory Visit: Payer: Self-pay | Admitting: Family Medicine

## 2024-08-25 DIAGNOSIS — Z760 Encounter for issue of repeat prescription: Secondary | ICD-10-CM

## 2024-08-26 ENCOUNTER — Ambulatory Visit: Payer: Self-pay | Admitting: Urology

## 2024-08-26 ENCOUNTER — Ambulatory Visit
Admission: RE | Admit: 2024-08-26 | Discharge: 2024-08-26 | Disposition: A | Discharge status: Home / Self Care | Attending: Gastroenterology | Admitting: Gastroenterology

## 2024-08-26 ENCOUNTER — Encounter: Payer: Self-pay | Admitting: Gastroenterology

## 2024-08-26 ENCOUNTER — Encounter
Admission: RE | Disposition: A | Discharge status: Home / Self Care | Payer: Self-pay | Source: Home / Self Care | Attending: Gastroenterology

## 2024-08-26 ENCOUNTER — Other Ambulatory Visit: Payer: Self-pay

## 2024-08-26 DIAGNOSIS — K5730 Diverticulosis of large intestine without perforation or abscess without bleeding: Secondary | ICD-10-CM | POA: Diagnosis not present

## 2024-08-26 DIAGNOSIS — K640 First degree hemorrhoids: Secondary | ICD-10-CM | POA: Diagnosis not present

## 2024-08-26 DIAGNOSIS — K625 Hemorrhage of anus and rectum: Secondary | ICD-10-CM | POA: Insufficient documentation

## 2024-08-26 HISTORY — PX: FLEXIBLE SIGMOIDOSCOPY: SHX5431

## 2024-08-26 MED ORDER — SODIUM CHLORIDE 0.9 % IV SOLN: INTRAVENOUS | Status: DC

## 2024-08-26 NOTE — Op Note (Signed)
 [facility] Gastroenterology Patient Name: William Taylor Procedure Date: 08/26/2024 10:23 AM MRN: [id number] Account #: [id number] Date of Birth: 08-25-1929 Admit Type: Outpatient Age: 89 Room: [facility] ENDO ROOM 2 Gender: Male Note Status: Finalized Instrument Name: Peds Colonoscope [id number] Procedure:             Flexible Sigmoidoscopy Indications:           Rectal hemorrhage Providers:             Ruel Kung MD, MD Referring MD:          Arlyss RAMAN. Cleatus, MD (Referring MD) Medicines:             None Complications:         No immediate complications. Procedure:             Pre-Anesthesia Assessment:                        - Prior to the procedure, a History and Physical was                         performed, and patient medications, allergies and                         sensitivities were reviewed. The patient's tolerance                         of previous anesthesia was reviewed.                        - The risks and benefits of the procedure and the                         sedation options and risks were discussed with the                         patient. All questions were answered and informed                         consent was obtained.                        - ASA Grade Assessment: II - A patient with mild                         systemic disease.                        After obtaining informed consent, the scope was passed                         under direct vision. The Colonoscope was introduced                         through the anus and advanced to the the descending                         colon. The flexible sigmoidoscopy was accomplished                         with  ease. The patient tolerated the procedure well.                         The quality of the bowel preparation was fair. Findings:      solid stool was found in the descending colon could not proceed higher       up - reached distal descending colon [Visualization].      Multiple  medium-mouthed diverticula were found in the sigmoid colon.      Non-bleeding internal hemorrhoids were found during retroflexion. The       hemorrhoids were large and Grade I (internal hemorrhoids that do not       prolapse). Impression:            - Preparation of the colon was fair.                        - Stool in the descending colon.                        - Diverticulosis in the sigmoid colon.                        - Non-bleeding internal hemorrhoids.                        - No specimens collected. Recommendation:        - Discharge patient to home (with escort).                        - Advance diet as tolerated. Procedure Code(s):     --- Professional ---                        [phone], Sigmoidoscopy, flexible; diagnostic, including                         collection of specimen(s) by brushing or washing, when                         performed (separate procedure) Diagnosis Code(s):     --- Professional ---                        K64.0, First degree hemorrhoids                        K57.30, Diverticulosis of large intestine without                         perforation or abscess without bleeding                        K62.5, Hemorrhage of anus and rectum CPT copyright 2022 [employer]. All rights reserved. The codes documented in this report are preliminary and upon coder review may  be revised to meet current compliance requirements. Ruel Kung, MD Ruel Kung MD, MD 08/26/2024 10:45:03 AM This report has been signed electronically. Number of Addenda: 0 Note Initiated On: 08/26/2024 10:23 AM Estimated Blood Loss:  Estimated blood loss: none.      [facility]

## 2024-08-26 NOTE — H&P (Signed)
 "                                                                                                                           Ruel Kung , MD [address], [address], [place], [place], [address] Phone: [phone] Fax: [phone]  Primary Care Physician:  [name], MD   Pre-Procedure History & Physical: HPI:  William Taylor is a 89 y.o. male is here for a sigmoidoscopy.    Past Medical History:  Diagnosis Date   Acquired dilation of ascending aorta and aortic root    Aortic atherosclerosis    Atypical chest pain    BPH (benign prostatic hyperplasia)    Cerebral microvascular disease    CHF (congestive heart failure) (HCC)    CKD (chronic kidney disease), stage III (HCC)    Colon polyp    Compression fracture of T2 vertebra (HCC) 02/26/2023   Coronary artery disease    a.) PCI LAD (Cypher DES) in 2005; b.)  LHC 2009: no obstructive CAD and patent stent; c.) PCI OM2 12/26/2010 (2.25 x 16 mm Promus Element); d.) LHC 10/28/2013: LAD 44m ISR, 45m, D1 90, D2 70, OM2 80 w/ patent stent;  e.) 06/2019 Low risk MV; f.) 07/2020 MV: EF 77%, no ischemia/infarct.   CVA (cerebral vascular accident) [facility])    a.) age indeterminate infarcts in the RIGHT cerebellum and LEFT parietal lobe   Depression    Dyspnea    Gall stones    GERD (gastroesophageal reflux disease)    Glaucoma    Hyperlipidemia    Hypertension    Hypertrophic nonobstructive cardiomyopathy (with severe asymmetric septal hypertrophy)    IDA (iron deficiency anemia)    Left inguinal hernia    Leg weakness    Long term current use of amiodarone     Melanoma (HCC)    Multinodular thyroid     NSTEMI (non-ST elevated myocardial infarction) (HCC) 10/26/2013   a.) experienced in setting of antithrombotic hold (clopidogrel ) for routine colonoscopy; b.) troponins trended: 1.00 --> 5.10 --> 6.20 ng/mL; c.) LHC 10/31/2013: 10/40% mLAD, 90/70% D1, 80% OM1 -- med mgmt   NSTEMI (non-ST elevated myocardial infarction) (HCC)  12/24/2010   a.) LHC 12/24/2010: 30 % LAD, 30% ISR mLAD, 50% dLAD, 95% OM2 --> d/t distal bifurcation lesion unable to see RCA --> transfer to [facility] --> PCI 12/26/2010 placing a 2.25 x 16 mm Promus Element DES to OM2   On rivaroxaban  therapy    Orthostatic hypotension    a. prev on midodrine /florinef /northera .   PAF (paroxysmal atrial fibrillation) (HCC)    a.) CHA2DS2VASc = 6 (age x2, HTN, CVA/PE x 2, prior MI/vascular disease) as of 02/24/2024; b.) rate/rhythm maintained on oral amiodarone ; chronically anticoagulated with rivaroxaban    Prostate cancer (HCC)    PSVT (paroxysmal supraventricular tachycardia)    Pulmonary embolism (HCC) 2006   Skin cancer    Wears dentures    partial upper and lower   Wears hearing  aid in both ears     Past Surgical History:  Procedure Laterality Date   CATARACT EXTRACTION Right 05/22/2011   CATARACT EXTRACTION W/PHACO Left 10/01/2023   Procedure: CATARACT EXTRACTION PHACO AND INTRAOCULAR LENS PLACEMENT (IOC) LEFT 23.44 01:56.2;  Surgeon: Mittie Gaskin, MD;  Location: [facility];  Service: Ophthalmology;  Laterality: Left;   colonoscopy     COLONOSCOPY W/ POLYPECTOMY  08/19/2013   Dr Jeri   COLONOSCOPY WITH PROPOFOL  N/A 08/15/2016   Procedure: COLONOSCOPY WITH PROPOFOL ;  Surgeon: Ruel Kung, MD;  Location: [facility] END[facility]COPY;  Service: Endoscopy;  Laterality: N/A;   CORONARY ANGIOPLASTY WITH STENT PLACEMENT Left 2005   Procedure: CORONARY ANGIOPLASTY WITH STENT PLACEMENT; Location[facility])   CORONARY ANGIOPLASTY WITH STENT PLACEMENT Left 12/26/2010   Procedure: CORONARY ANGIOPLASTY WITH STENT PLACEMENT; Location: [facility]; Surgeon: Peter Jordan, MD   ENTEROSCOPY N/A 09/22/2018   Procedure: ENTEROSCOPY;  Surgeon: Unk Corinn Skiff, MD;  Location: [facility];  Service: Gastroenterology;  Laterality: N/A;   ESOPHAGOGASTRODUODENOSCOPY (EGD) WITH PROPOFOL  N/A 08/15/2016   Procedure: ESOPHAGOGASTRODUODENOSCOPY (EGD)  WITH PROPOFOL ;  Surgeon: Ruel Kung, MD;  Location: [facility] ENDOSCOPY;  Service: Endoscopy;  Laterality: N/A;   GIVENS CAPSULE STUDY N/A 09/26/2016   Procedure: GIVENS CAPSULE STUDY;  Surgeon: Ruel Kung, MD;  Location: [facility] ENDOSCOPY;  Service: Endoscopy;  Laterality: N/A;   HERNIA REPAIR  08/20/2011   INGUINAL HERNIA REPAIR Left 02/27/2024   Procedure: REPAIR, HERNIA, INGUINAL, ADULT, RECURRENT;  Surgeon: Marinda Jayson KIDD, MD;  Location: [facility] ORS;  Service: General;  Laterality: Left;  open procedure with mesh   LEFT HEART CATH AND CORONARY ANGIOGRAPHY N/A 04/29/2022   Procedure: LEFT HEART CATH AND CORONARY ANGIOGRAPHY;  Surgeon: Darron Deatrice LABOR, MD;  Location: [facility];  Service: Cardiovascular;  Laterality: N/A;   LEFT HEART CATH AND CORONARY ANGIOGRAPHY Left 2009   Procedure: LEFT HEART CATH AND CORONARY ANGIOGRAPHY; Location: [facility])   LEFT HEART CATH AND CORONARY ANGIOGRAPHY Left 12/24/2010   Procedure: LEFT HEART CATH AND CORONARY ANGIOGRAPHY; Locaiton: [facility]; Surgeon: [name], MD   LEFT HEART CATH AND CORONARY ANGIOGRAPHY Left 10/28/2013   Procedure: LEFT HEART CATH AND CORONARY ANGIOGRAPHY; Location: [facility]; Surgeon: [name], MD   LUMBAR SPINE SURGERY     x2 in [facility] in [phone]   UPPER GI ENDOSCOPY  04/19/2014   Dr Jeri    Prior to Admission medications  Medication Sig Start Date End Date Taking? Authorizing Provider  amiodarone  (PACERONE ) 100 MG tablet Take 1 tablet (100 mg total) by mouth daily. 12/01/23  Yes Gollan, Timothy J, MD  fenofibrate  micronized (ANTARA ) 130 MG capsule TAKE 1 CAPSULE BY MOUTH DAILY  BEFORE BREAKFAST 07/07/24  Yes [name], MD  finasteride  (PROSCAR ) 5 MG tablet TAKE 1 TABLET BY MOUTH DAILY 07/06/24  Yes Stoioff, Glendia BROCKS, MD  levobunolol  (BETAGAN ) 0.5 % ophthalmic solution Place 1 drop into the right eye daily.   Yes [provider]  pantoprazole  (PROTONIX ) 40 MG tablet TAKE ONE (1) TABLET BY MOUTH  TWO TIMES PER DAY 08/02/24  Yes [name], MD  acetaminophen  (TYLENOL ) 500 MG tablet Take 500 mg by mouth every 6 (six) hours as needed for moderate pain (pain score 4-6).    [provider]  loratadine  (CLARITIN ) 10 MG tablet Take 1 tablet (10 mg total) by mouth daily. 08/30/20   Maribeth Camellia MATSU, MD  Multiple Vitamin (MULTIVITAMIN PO) Take 1 tablet by mouth daily.  [provider]  nitroGLYCERIN  (NITROSTAT ) 0.4 MG SL tablet DISSOLVE UNDER THE TONGUE 1 TABLET EVERY 5 MINUTES AS NEEDED FOR CHEST PAIN 04/30/23   Maribeth Camellia MATSU, MD  pravastatin  (PRAVACHOL ) 40 MG tablet Take 1 tablet (40 mg total) by mouth at bedtime. 11/18/23   [name], MD  XARELTO  15 MG TABS tablet TAKE 1 TABLET BY MOUTH DAILY 04/28/24   Gollan, Timothy J, MD    Allergies as of 08/13/2024 - Review Complete 08/06/2024  Allergen Reaction Noted   Bee venom Swelling 07/17/2015   Sulfasalazine Hives and Swelling 01/22/2016   Warfarin  06/12/2021    Family History  Problem Relation Age of Onset   Stroke Mother    Colon cancer Father    Coronary artery disease Brother    Other Brother 31       lymes dz    Social History   Socioeconomic History   Marital status: Married    Spouse name: Not on file   Number of children: Not on file   Years of education: Not on file   Highest education level: Some college, no degree  Occupational History   Occupation: retired   Tobacco Use   Smoking status: Never   Smokeless tobacco: Never  Vaping Use   Vaping status: Never Used  Substance and Sexual Activity   Alcohol use: No   Drug use: No   Sexual activity: Not Currently  Other Topics Concern   Not on file  Social History Narrative   Retired city emergency planning/management officer, 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     Married 2010   1 daughter in [place], [place], 2 daughters in [place], [place].   Social Drivers of Health   Tobacco Use: Low Risk (08/26/2024)   Patient History    Smoking Tobacco Use: Never    Smokeless Tobacco Use:  Never    Passive Exposure: Not on file  Financial Resource Strain: Low Risk (05/07/2024)   Overall Financial Resource Strain (CARDIA)    Difficulty of Paying Living Expenses: Not hard at all  Food Insecurity: No Food Insecurity (07/23/2024)   Epic    Worried About Programme Researcher, Broadcasting/film/video in the Last Year: Never true    Ran Out of Food in the Last Year: Never true  Transportation Needs: No Transportation Needs (07/23/2024)   Epic    Lack of Transportation (Medical): No    Lack of Transportation (Non-Medical): No  Physical Activity: Inactive (06/30/2024)   Exercise Vital Sign    Days of Exercise per Week: 0 days    Minutes of Exercise per Session: Not on file  Stress: No Stress Concern Present (06/30/2024)   [employer] of Occupational Health - Occupational Stress Questionnaire    Feeling of Stress: Not at all  Social Connections: Socially Integrated (07/23/2024)   Social Connection and Isolation Panel    Frequency of Communication with Friends and Family: Twice a week    Frequency of Social Gatherings with Friends and Family: Twice a week    Attends Religious Services: 1 to 4 times per year    Active Member of Clubs or Organizations: No    Attends Banker Meetings: 1 to 4 times per year    Marital Status: Married  Catering Manager Violence: Not At Risk (07/23/2024)   Epic    Fear of Current or Ex-Partner: No    Emotionally Abused: No    Physically Abused: No    Sexually Abused: No  Depression (PHQ2-9): Low Risk (08/02/2024)  Depression (PHQ2-9)    PHQ-2 Score: 0  Alcohol Screen: Low Risk (10/22/2023)   Alcohol Screen    Last Alcohol Screening Score (AUDIT): 0  Housing: Low Risk (07/23/2024)   Epic    Unable to Pay for Housing in the Last Year: No    Number of Times Moved in the Last Year: 0    Homeless in the Last Year: No  Utilities: Not At Risk (07/23/2024)   Epic    Threatened with loss of utilities: No  Health Literacy: Adequate Health Literacy (10/22/2023)    B1300 Health Literacy    Frequency of need for help with medical instructions: Never    Review of Systems: See HPI, otherwise negative ROS  Physical Exam: BP (!) 183/114   Pulse 81   Temp (!) 96.2 F (35.7 C) (Temporal)   Resp 18   Ht 6' 4 (1.93 m)   Wt 82.4 kg   SpO2 97%   BMI 22.11 kg/m  General:   Alert,  pleasant and cooperative in NAD Head:  Normocephalic and atraumatic. Neck:  Supple; no masses or thyromegaly. Lungs:  Clear throughout to auscultation, normal respiratory effort.    Heart:  +S1, +S2, Regular rate and rhythm, No edema. Abdomen:  Soft, nontender and nondistended. Normal bowel sounds, without guarding, and without rebound.   Neurologic:  Alert and  oriented x4;  grossly normal neurologically.  Impression/Plan: KALIK HOARE is here for an colonoscopy to be performed for Flexible sigmoidoscopy .  Risks, benefits, limitations, and alternatives regarding  colonoscopy have been reviewed with the patient.  Questions have been answered.  All parties agreeable.   Ruel Kung, MD  08/26/2024, 10:04 AM  "

## 2024-09-21 ENCOUNTER — Encounter: Payer: Self-pay | Admitting: Cardiovascular Disease

## 2024-09-21 DIAGNOSIS — I25118 Atherosclerotic heart disease of native coronary artery with other forms of angina pectoris: Secondary | ICD-10-CM

## 2024-09-21 MED ORDER — NITROGLYCERIN 0.4 MG SL SUBL: SUBLINGUAL_TABLET | SUBLINGUAL | 3 refills | Status: AC

## 2024-10-25 ENCOUNTER — Ambulatory Visit

## 2024-11-05 ENCOUNTER — Other Ambulatory Visit

## 2024-12-16 ENCOUNTER — Ambulatory Visit: Admitting: Medical

## 2025-05-06 ENCOUNTER — Ambulatory Visit: Admitting: Oncology

## 2025-05-06 ENCOUNTER — Other Ambulatory Visit

## 2025-08-24 ENCOUNTER — Ambulatory Visit: Admitting: Urology
